# Patient Record
Sex: Male | Born: 1935 | Race: White | Hispanic: No | Smoking: Former smoker
Health system: Southern US, Community
[De-identification: ages and names within clinical notes are randomized; demographics above are authoritative.]

## PROBLEM LIST (undated history)

## (undated) DIAGNOSIS — R946 Abnormal results of thyroid function studies: Secondary | ICD-10-CM

## (undated) DIAGNOSIS — K3189 Other diseases of stomach and duodenum: Secondary | ICD-10-CM

## (undated) DIAGNOSIS — K219 Gastro-esophageal reflux disease without esophagitis: Secondary | ICD-10-CM

## (undated) DIAGNOSIS — M1990 Unspecified osteoarthritis, unspecified site: Secondary | ICD-10-CM

## (undated) DIAGNOSIS — E785 Hyperlipidemia, unspecified: Secondary | ICD-10-CM

## (undated) DIAGNOSIS — S065X9A Traumatic subdural hemorrhage with loss of consciousness of unspecified duration, initial encounter: Secondary | ICD-10-CM

## (undated) DIAGNOSIS — J45909 Unspecified asthma, uncomplicated: Secondary | ICD-10-CM

## (undated) DIAGNOSIS — I2510 Atherosclerotic heart disease of native coronary artery without angina pectoris: Secondary | ICD-10-CM

## (undated) DIAGNOSIS — K766 Portal hypertension: Secondary | ICD-10-CM

## (undated) DIAGNOSIS — J90 Pleural effusion, not elsewhere classified: Secondary | ICD-10-CM

## (undated) DIAGNOSIS — D126 Benign neoplasm of colon, unspecified: Secondary | ICD-10-CM

## (undated) DIAGNOSIS — D72821 Monocytosis (symptomatic): Secondary | ICD-10-CM

## (undated) DIAGNOSIS — J189 Pneumonia, unspecified organism: Secondary | ICD-10-CM

## (undated) DIAGNOSIS — K626 Ulcer of anus and rectum: Secondary | ICD-10-CM

## (undated) DIAGNOSIS — I509 Heart failure, unspecified: Secondary | ICD-10-CM

## (undated) DIAGNOSIS — I472 Ventricular tachycardia, unspecified: Secondary | ICD-10-CM

## (undated) DIAGNOSIS — I4891 Unspecified atrial fibrillation: Secondary | ICD-10-CM

## (undated) DIAGNOSIS — Z95810 Presence of automatic (implantable) cardiac defibrillator: Secondary | ICD-10-CM

## (undated) DIAGNOSIS — K529 Noninfective gastroenteritis and colitis, unspecified: Secondary | ICD-10-CM

## (undated) DIAGNOSIS — F05 Delirium due to known physiological condition: Secondary | ICD-10-CM

## (undated) DIAGNOSIS — K760 Fatty (change of) liver, not elsewhere classified: Secondary | ICD-10-CM

## (undated) DIAGNOSIS — I5022 Chronic systolic (congestive) heart failure: Secondary | ICD-10-CM

## (undated) DIAGNOSIS — I255 Ischemic cardiomyopathy: Secondary | ICD-10-CM

## (undated) DIAGNOSIS — N200 Calculus of kidney: Secondary | ICD-10-CM

## (undated) DIAGNOSIS — K5790 Diverticulosis of intestine, part unspecified, without perforation or abscess without bleeding: Secondary | ICD-10-CM

## (undated) DIAGNOSIS — I10 Essential (primary) hypertension: Secondary | ICD-10-CM

## (undated) DIAGNOSIS — I219 Acute myocardial infarction, unspecified: Secondary | ICD-10-CM

## (undated) DIAGNOSIS — D751 Secondary polycythemia: Secondary | ICD-10-CM

## (undated) HISTORY — DX: Hyperlipidemia, unspecified: E78.5

## (undated) HISTORY — DX: Calculus of kidney: N20.0

## (undated) HISTORY — DX: Gastro-esophageal reflux disease without esophagitis: K21.9

## (undated) HISTORY — DX: Pleural effusion, not elsewhere classified: J90

## (undated) HISTORY — DX: Heart failure, unspecified: I50.9

## (undated) HISTORY — DX: Diverticulosis of intestine, part unspecified, without perforation or abscess without bleeding: K57.90

## (undated) HISTORY — DX: Monocytosis (symptomatic): D72.821

## (undated) HISTORY — PX: TONSILLECTOMY: SUR1361

## (undated) HISTORY — DX: Chronic systolic (congestive) heart failure: I50.22

## (undated) HISTORY — DX: Fatty (change of) liver, not elsewhere classified: K76.0

## (undated) HISTORY — DX: Benign neoplasm of colon, unspecified: D12.6

## (undated) HISTORY — PX: KNEE ARTHROSCOPY: SHX127

## (undated) HISTORY — DX: Portal hypertension: K76.6

## (undated) HISTORY — DX: Ischemic cardiomyopathy: I25.5

## (undated) HISTORY — DX: Other diseases of stomach and duodenum: K31.89

## (undated) HISTORY — DX: Noninfective gastroenteritis and colitis, unspecified: K52.9

## (undated) HISTORY — DX: Traumatic subdural hemorrhage with loss of consciousness of unspecified duration, initial encounter: S06.5X9A

## (undated) HISTORY — PX: CHOLECYSTECTOMY: SHX55

## (undated) HISTORY — DX: Essential (primary) hypertension: I10

## (undated) HISTORY — DX: Unspecified atrial fibrillation: I48.91

## (undated) HISTORY — DX: Atherosclerotic heart disease of native coronary artery without angina pectoris: I25.10

## (undated) HISTORY — DX: Ventricular tachycardia, unspecified: I47.20

## (undated) HISTORY — DX: Secondary polycythemia: D75.1

## (undated) HISTORY — DX: Ulcer of anus and rectum: K62.6

## (undated) HISTORY — DX: Ventricular tachycardia: I47.2

## (undated) HISTORY — DX: Pneumonia, unspecified organism: J18.9

---

## 1981-01-31 HISTORY — PX: CORONARY ARTERY BYPASS GRAFT: SHX141

## 1997-09-11 ENCOUNTER — Ambulatory Visit (HOSPITAL_COMMUNITY): Admission: RE | Admit: 1997-09-11 | Discharge: 1997-09-11 | Payer: Self-pay

## 1998-02-11 ENCOUNTER — Emergency Department (HOSPITAL_COMMUNITY): Admission: EM | Admit: 1998-02-11 | Discharge: 1998-02-11 | Payer: Self-pay | Admitting: Emergency Medicine

## 1998-05-05 ENCOUNTER — Emergency Department (HOSPITAL_COMMUNITY): Admission: EM | Admit: 1998-05-05 | Discharge: 1998-05-05 | Payer: Self-pay | Admitting: Emergency Medicine

## 1998-05-06 ENCOUNTER — Encounter: Payer: Self-pay | Admitting: Emergency Medicine

## 1998-06-18 ENCOUNTER — Encounter: Payer: Self-pay | Admitting: Emergency Medicine

## 1998-06-18 ENCOUNTER — Emergency Department (HOSPITAL_COMMUNITY): Admission: EM | Admit: 1998-06-18 | Discharge: 1998-06-18 | Payer: Self-pay | Admitting: Emergency Medicine

## 1998-09-18 ENCOUNTER — Encounter (HOSPITAL_COMMUNITY): Payer: Self-pay | Admitting: Oncology

## 1998-09-18 ENCOUNTER — Ambulatory Visit (HOSPITAL_COMMUNITY): Admission: RE | Admit: 1998-09-18 | Discharge: 1998-09-18 | Payer: Self-pay | Admitting: Oncology

## 1998-11-02 ENCOUNTER — Ambulatory Visit (HOSPITAL_COMMUNITY): Admission: RE | Admit: 1998-11-02 | Discharge: 1998-11-02 | Payer: Self-pay | Admitting: *Deleted

## 2001-02-08 ENCOUNTER — Ambulatory Visit (HOSPITAL_COMMUNITY): Admission: RE | Admit: 2001-02-08 | Discharge: 2001-02-08 | Payer: Self-pay | Admitting: *Deleted

## 2001-04-04 ENCOUNTER — Inpatient Hospital Stay (HOSPITAL_COMMUNITY): Admission: EM | Admit: 2001-04-04 | Discharge: 2001-04-06 | Payer: Self-pay | Admitting: *Deleted

## 2001-10-03 ENCOUNTER — Encounter: Admission: RE | Admit: 2001-10-03 | Discharge: 2001-10-03 | Payer: Self-pay | Admitting: Internal Medicine

## 2001-10-03 ENCOUNTER — Encounter: Payer: Self-pay | Admitting: Internal Medicine

## 2002-01-31 HISTORY — PX: CORONARY ANGIOPLASTY WITH STENT PLACEMENT: SHX49

## 2002-05-29 ENCOUNTER — Encounter: Payer: Self-pay | Admitting: Emergency Medicine

## 2002-05-29 ENCOUNTER — Ambulatory Visit (HOSPITAL_COMMUNITY): Admission: EM | Admit: 2002-05-29 | Discharge: 2002-05-30 | Payer: Self-pay | Admitting: Emergency Medicine

## 2002-08-14 ENCOUNTER — Encounter (INDEPENDENT_AMBULATORY_CARE_PROVIDER_SITE_OTHER): Payer: Self-pay

## 2002-08-14 ENCOUNTER — Ambulatory Visit (HOSPITAL_COMMUNITY): Admission: RE | Admit: 2002-08-14 | Discharge: 2002-08-14 | Payer: Self-pay | Admitting: *Deleted

## 2002-09-30 ENCOUNTER — Encounter: Payer: Self-pay | Admitting: Internal Medicine

## 2002-09-30 ENCOUNTER — Encounter: Admission: RE | Admit: 2002-09-30 | Discharge: 2002-09-30 | Payer: Self-pay | Admitting: Internal Medicine

## 2002-10-28 ENCOUNTER — Encounter: Payer: Self-pay | Admitting: Emergency Medicine

## 2002-10-28 ENCOUNTER — Inpatient Hospital Stay (HOSPITAL_COMMUNITY): Admission: EM | Admit: 2002-10-28 | Discharge: 2002-11-01 | Payer: Self-pay | Admitting: Emergency Medicine

## 2002-12-18 ENCOUNTER — Emergency Department (HOSPITAL_COMMUNITY): Admission: EM | Admit: 2002-12-18 | Discharge: 2002-12-18 | Payer: Self-pay | Admitting: Emergency Medicine

## 2004-01-16 ENCOUNTER — Ambulatory Visit: Payer: Self-pay | Admitting: Oncology

## 2004-03-11 ENCOUNTER — Ambulatory Visit: Payer: Self-pay | Admitting: Oncology

## 2004-05-07 ENCOUNTER — Ambulatory Visit: Payer: Self-pay | Admitting: Oncology

## 2004-07-01 ENCOUNTER — Ambulatory Visit: Payer: Self-pay | Admitting: Oncology

## 2004-10-21 ENCOUNTER — Ambulatory Visit: Payer: Self-pay | Admitting: Oncology

## 2005-02-10 ENCOUNTER — Ambulatory Visit: Payer: Self-pay | Admitting: Oncology

## 2005-06-08 ENCOUNTER — Ambulatory Visit: Payer: Self-pay | Admitting: Oncology

## 2005-06-14 LAB — CBC WITH DIFFERENTIAL/PLATELET
EOS%: 1.7 % (ref 0.0–7.0)
Eosinophils Absolute: 0.1 10*3/uL (ref 0.0–0.5)
LYMPH%: 28 % (ref 14.0–48.0)
MCH: 30.4 pg (ref 28.0–33.4)
MCHC: 34.3 g/dL (ref 32.0–35.9)
MCV: 88.6 fL (ref 81.6–98.0)
MONO%: 9.8 % (ref 0.0–13.0)
Platelets: 299 10*3/uL (ref 145–400)
RBC: 5.23 10*6/uL (ref 4.20–5.71)
RDW: 13.9 % (ref 11.2–14.6)

## 2005-10-25 ENCOUNTER — Ambulatory Visit: Payer: Self-pay | Admitting: Oncology

## 2005-10-27 LAB — CBC WITH DIFFERENTIAL/PLATELET
BASO%: 0.5 % (ref 0.0–2.0)
EOS%: 2.5 % (ref 0.0–7.0)
HCT: 46.8 % (ref 38.7–49.9)
HGB: 16.2 g/dL (ref 13.0–17.1)
MCHC: 34.5 g/dL (ref 32.0–35.9)
MONO#: 0.7 10*3/uL (ref 0.1–0.9)
NEUT%: 61.5 % (ref 40.0–75.0)
RDW: 14 % (ref 11.2–14.6)
WBC: 7.1 10*3/uL (ref 4.0–10.0)
lymph#: 1.8 10*3/uL (ref 0.9–3.3)

## 2005-10-27 LAB — COMPREHENSIVE METABOLIC PANEL
ALT: 23 U/L (ref 0–40)
AST: 24 U/L (ref 0–37)
Albumin: 4.5 g/dL (ref 3.5–5.2)
CO2: 29 mEq/L (ref 19–32)
Calcium: 9.3 mg/dL (ref 8.4–10.5)
Chloride: 104 mEq/L (ref 96–112)
Creatinine, Ser: 1.32 mg/dL (ref 0.40–1.50)
Potassium: 3.9 mEq/L (ref 3.5–5.3)
Sodium: 142 mEq/L (ref 135–145)
Total Protein: 7.1 g/dL (ref 6.0–8.3)

## 2005-10-27 LAB — LACTATE DEHYDROGENASE: LDH: 120 U/L (ref 94–250)

## 2006-02-20 ENCOUNTER — Ambulatory Visit: Payer: Self-pay | Admitting: Oncology

## 2006-02-23 LAB — CBC WITH DIFFERENTIAL/PLATELET
BASO%: 0.5 % (ref 0.0–2.0)
Eosinophils Absolute: 0.2 10*3/uL (ref 0.0–0.5)
LYMPH%: 25.6 % (ref 14.0–48.0)
MCHC: 34.8 g/dL (ref 32.0–35.9)
MONO#: 0.8 10*3/uL (ref 0.1–0.9)
MONO%: 11.5 % (ref 0.0–13.0)
NEUT#: 4.2 10*3/uL (ref 1.5–6.5)
Platelets: 243 10*3/uL (ref 145–400)
RBC: 5.32 10*6/uL (ref 4.20–5.71)
RDW: 13.8 % (ref 11.2–14.6)
WBC: 7 10*3/uL (ref 4.0–10.0)

## 2006-06-20 ENCOUNTER — Ambulatory Visit: Payer: Self-pay | Admitting: Oncology

## 2006-06-30 LAB — CBC WITH DIFFERENTIAL/PLATELET
BASO%: 1 % (ref 0.0–2.0)
HCT: 46.9 % (ref 38.7–49.9)
LYMPH%: 31.4 % (ref 14.0–48.0)
MCH: 29.3 pg (ref 28.0–33.4)
MCHC: 34.1 g/dL (ref 32.0–35.9)
MCV: 86 fL (ref 81.6–98.0)
MONO%: 10 % (ref 0.0–13.0)
NEUT%: 55.1 % (ref 40.0–75.0)
Platelets: 267 10*3/uL (ref 145–400)
RBC: 5.45 10*6/uL (ref 4.20–5.71)

## 2006-09-27 ENCOUNTER — Encounter: Admission: RE | Admit: 2006-09-27 | Discharge: 2006-09-27 | Payer: Self-pay | Admitting: Internal Medicine

## 2006-10-16 ENCOUNTER — Inpatient Hospital Stay (HOSPITAL_COMMUNITY): Admission: EM | Admit: 2006-10-16 | Discharge: 2006-10-18 | Payer: Self-pay | Admitting: Emergency Medicine

## 2006-10-17 ENCOUNTER — Ambulatory Visit: Payer: Self-pay | Admitting: Oncology

## 2006-11-10 LAB — CBC WITH DIFFERENTIAL/PLATELET
BASO%: 0.6 % (ref 0.0–2.0)
EOS%: 3.1 % (ref 0.0–7.0)
HCT: 42.8 % (ref 38.7–49.9)
LYMPH%: 27.7 % (ref 14.0–48.0)
MCH: 30.7 pg (ref 28.0–33.4)
MCHC: 35.2 g/dL (ref 32.0–35.9)
MONO#: 0.7 10*3/uL (ref 0.1–0.9)
NEUT%: 57.1 % (ref 40.0–75.0)
Platelets: 218 10*3/uL (ref 145–400)
RBC: 4.91 10*6/uL (ref 4.20–5.71)
WBC: 6.4 10*3/uL (ref 4.0–10.0)
lymph#: 1.8 10*3/uL (ref 0.9–3.3)

## 2007-05-09 ENCOUNTER — Ambulatory Visit: Payer: Self-pay | Admitting: Oncology

## 2007-05-25 LAB — CBC WITH DIFFERENTIAL/PLATELET
BASO%: 1.3 % (ref 0.0–2.0)
EOS%: 2.7 % (ref 0.0–7.0)
Eosinophils Absolute: 0.3 10*3/uL (ref 0.0–0.5)
LYMPH%: 25 % (ref 14.0–48.0)
MCH: 30.9 pg (ref 28.0–33.4)
MCHC: 34.7 g/dL (ref 32.0–35.9)
MCV: 89 fL (ref 81.6–98.0)
MONO%: 10.3 % (ref 0.0–13.0)
NEUT#: 6.2 10*3/uL (ref 1.5–6.5)
RBC: 5.13 10*6/uL (ref 4.20–5.71)
RDW: 12.8 % (ref 11.2–14.6)

## 2007-06-10 ENCOUNTER — Emergency Department (HOSPITAL_COMMUNITY): Admission: EM | Admit: 2007-06-10 | Discharge: 2007-06-10 | Payer: Self-pay | Admitting: Emergency Medicine

## 2007-11-13 ENCOUNTER — Ambulatory Visit: Payer: Self-pay | Admitting: Oncology

## 2007-11-15 LAB — COMPREHENSIVE METABOLIC PANEL
AST: 20 U/L (ref 0–37)
Albumin: 4.7 g/dL (ref 3.5–5.2)
BUN: 15 mg/dL (ref 6–23)
Calcium: 10 mg/dL (ref 8.4–10.5)
Chloride: 103 mEq/L (ref 96–112)
Potassium: 4.5 mEq/L (ref 3.5–5.3)
Sodium: 144 mEq/L (ref 135–145)
Total Protein: 7.6 g/dL (ref 6.0–8.3)

## 2007-11-15 LAB — CBC WITH DIFFERENTIAL/PLATELET
Basophils Absolute: 0 10*3/uL (ref 0.0–0.1)
EOS%: 3.2 % (ref 0.0–7.0)
Eosinophils Absolute: 0.2 10*3/uL (ref 0.0–0.5)
HGB: 16.6 g/dL (ref 13.0–17.1)
MCH: 31.2 pg (ref 28.0–33.4)
NEUT#: 4.1 10*3/uL (ref 1.5–6.5)
RDW: 14.2 % (ref 11.2–14.6)
lymph#: 1.8 10*3/uL (ref 0.9–3.3)

## 2008-01-10 ENCOUNTER — Ambulatory Visit: Payer: Self-pay | Admitting: Oncology

## 2008-01-16 LAB — CBC WITH DIFFERENTIAL/PLATELET
BASO%: 0.7 % (ref 0.0–2.0)
EOS%: 11 % — ABNORMAL HIGH (ref 0.0–7.0)
MCH: 31.4 pg (ref 28.0–33.4)
MCHC: 34.2 g/dL (ref 32.0–35.9)
RDW: 14.5 % (ref 11.2–14.6)
lymph#: 1.9 10*3/uL (ref 0.9–3.3)

## 2008-02-11 ENCOUNTER — Inpatient Hospital Stay (HOSPITAL_COMMUNITY): Admission: AD | Admit: 2008-02-11 | Discharge: 2008-02-13 | Payer: Self-pay | Admitting: *Deleted

## 2008-02-12 ENCOUNTER — Encounter (INDEPENDENT_AMBULATORY_CARE_PROVIDER_SITE_OTHER): Payer: Self-pay | Admitting: *Deleted

## 2008-03-07 ENCOUNTER — Ambulatory Visit: Payer: Self-pay | Admitting: Oncology

## 2008-03-11 LAB — CBC WITH DIFFERENTIAL/PLATELET
Basophils Absolute: 0 10*3/uL (ref 0.0–0.1)
EOS%: 2.2 % (ref 0.0–7.0)
Eosinophils Absolute: 0.2 10*3/uL (ref 0.0–0.5)
HGB: 14.6 g/dL (ref 13.0–17.1)
LYMPH%: 23.7 % (ref 14.0–48.0)
MCH: 30.6 pg (ref 28.0–33.4)
MCV: 89.6 fL (ref 81.6–98.0)
MONO%: 13.5 % — ABNORMAL HIGH (ref 0.0–13.0)
Platelets: 319 10*3/uL (ref 145–400)
RBC: 4.78 10*6/uL (ref 4.20–5.71)
RDW: 13.7 % (ref 11.2–14.6)

## 2008-05-13 ENCOUNTER — Ambulatory Visit: Payer: Self-pay | Admitting: Oncology

## 2008-05-15 LAB — COMPREHENSIVE METABOLIC PANEL
ALT: 15 U/L (ref 0–53)
AST: 22 U/L (ref 0–37)
Albumin: 4.7 g/dL (ref 3.5–5.2)
Alkaline Phosphatase: 61 U/L (ref 39–117)
Calcium: 9.8 mg/dL (ref 8.4–10.5)
Chloride: 104 mEq/L (ref 96–112)
Potassium: 4.5 mEq/L (ref 3.5–5.3)

## 2008-05-15 LAB — CBC WITH DIFFERENTIAL/PLATELET
BASO%: 0.3 % (ref 0.0–2.0)
EOS%: 2 % (ref 0.0–7.0)
MCH: 27.8 pg (ref 27.2–33.4)
MCHC: 32.5 g/dL (ref 32.0–36.0)
MONO%: 13.7 % (ref 0.0–14.0)
RBC: 5.08 10*6/uL (ref 4.20–5.82)
RDW: 14.2 % (ref 11.0–14.6)
lymph#: 1.6 10*3/uL (ref 0.9–3.3)

## 2008-05-19 ENCOUNTER — Ambulatory Visit (HOSPITAL_COMMUNITY): Admission: RE | Admit: 2008-05-19 | Discharge: 2008-05-19 | Payer: Self-pay | Admitting: *Deleted

## 2008-07-08 ENCOUNTER — Ambulatory Visit: Payer: Self-pay | Admitting: Oncology

## 2008-07-10 LAB — CBC WITH DIFFERENTIAL/PLATELET
BASO%: 0.3 % (ref 0.0–2.0)
EOS%: 2.6 % (ref 0.0–7.0)
LYMPH%: 24.8 % (ref 14.0–49.0)
MCH: 27.8 pg (ref 27.2–33.4)
MCHC: 32.7 g/dL (ref 32.0–36.0)
MCV: 84.9 fL (ref 79.3–98.0)
MONO#: 0.9 10*3/uL (ref 0.1–0.9)
MONO%: 12.7 % (ref 0.0–14.0)
Platelets: 202 10*3/uL (ref 140–400)
RBC: 5.18 10*6/uL (ref 4.20–5.82)
WBC: 7.2 10*3/uL (ref 4.0–10.3)
nRBC: 0 % (ref 0–0)

## 2008-08-23 ENCOUNTER — Emergency Department (HOSPITAL_COMMUNITY): Admission: EM | Admit: 2008-08-23 | Discharge: 2008-08-23 | Payer: Self-pay | Admitting: Family Medicine

## 2008-09-03 ENCOUNTER — Ambulatory Visit: Payer: Self-pay | Admitting: Oncology

## 2008-09-05 LAB — CBC WITH DIFFERENTIAL/PLATELET
Eosinophils Absolute: 0.2 10*3/uL (ref 0.0–0.5)
LYMPH%: 24.5 % (ref 14.0–49.0)
MCHC: 33.6 g/dL (ref 32.0–36.0)
MCV: 85.8 fL (ref 79.3–98.0)
MONO%: 19.6 % — ABNORMAL HIGH (ref 0.0–14.0)
NEUT#: 4 10*3/uL (ref 1.5–6.5)
Platelets: 178 10*3/uL (ref 140–400)
RBC: 5.13 10*6/uL (ref 4.20–5.82)

## 2008-11-04 ENCOUNTER — Ambulatory Visit: Payer: Self-pay | Admitting: Oncology

## 2008-11-06 LAB — COMPREHENSIVE METABOLIC PANEL
ALT: 11 U/L (ref 0–53)
AST: 17 U/L (ref 0–37)
Albumin: 4.5 g/dL (ref 3.5–5.2)
Alkaline Phosphatase: 70 U/L (ref 39–117)
BUN: 18 mg/dL (ref 6–23)
Calcium: 9.1 mg/dL (ref 8.4–10.5)
Chloride: 105 mEq/L (ref 96–112)
Potassium: 4.4 mEq/L (ref 3.5–5.3)
Sodium: 142 mEq/L (ref 135–145)

## 2008-11-06 LAB — CBC WITH DIFFERENTIAL/PLATELET
BASO%: 0.6 % (ref 0.0–2.0)
EOS%: 2 % (ref 0.0–7.0)
HGB: 14.6 g/dL (ref 13.0–17.1)
MCH: 29.9 pg (ref 27.2–33.4)
MCHC: 34.1 g/dL (ref 32.0–36.0)
MCV: 87.7 fL (ref 79.3–98.0)
MONO%: 15.9 % — ABNORMAL HIGH (ref 0.0–14.0)
RBC: 4.87 10*6/uL (ref 4.20–5.82)
RDW: 16 % — ABNORMAL HIGH (ref 11.0–14.6)
lymph#: 1.7 10*3/uL (ref 0.9–3.3)

## 2009-03-05 ENCOUNTER — Ambulatory Visit (HOSPITAL_COMMUNITY): Admission: RE | Admit: 2009-03-05 | Discharge: 2009-03-05 | Payer: Self-pay | Admitting: Cardiology

## 2009-05-05 ENCOUNTER — Ambulatory Visit: Payer: Self-pay | Admitting: Oncology

## 2009-05-07 LAB — COMPREHENSIVE METABOLIC PANEL
AST: 17 U/L (ref 0–37)
Albumin: 4.8 g/dL (ref 3.5–5.2)
Alkaline Phosphatase: 67 U/L (ref 39–117)
BUN: 18 mg/dL (ref 6–23)
Creatinine, Ser: 1.18 mg/dL (ref 0.40–1.50)
Glucose, Bld: 107 mg/dL — ABNORMAL HIGH (ref 70–99)
Potassium: 4.4 mEq/L (ref 3.5–5.3)
Total Bilirubin: 0.7 mg/dL (ref 0.3–1.2)

## 2009-05-07 LAB — CBC WITH DIFFERENTIAL/PLATELET
Basophils Absolute: 0 10*3/uL (ref 0.0–0.1)
EOS%: 1.5 % (ref 0.0–7.0)
Eosinophils Absolute: 0.1 10*3/uL (ref 0.0–0.5)
HCT: 44.8 % (ref 38.4–49.9)
HGB: 14.9 g/dL (ref 13.0–17.1)
LYMPH%: 24.4 % (ref 14.0–49.0)
MCH: 28.9 pg (ref 27.2–33.4)
MCV: 87 fL (ref 79.3–98.0)
MONO%: 16.4 % — ABNORMAL HIGH (ref 0.0–14.0)
NEUT#: 4.4 10*3/uL (ref 1.5–6.5)
NEUT%: 57.1 % (ref 39.0–75.0)
Platelets: 229 10*3/uL (ref 140–400)
RDW: 16.2 % — ABNORMAL HIGH (ref 11.0–14.6)

## 2009-05-12 ENCOUNTER — Inpatient Hospital Stay (HOSPITAL_COMMUNITY): Admission: EM | Admit: 2009-05-12 | Discharge: 2009-05-15 | Payer: Self-pay | Admitting: Emergency Medicine

## 2009-05-12 ENCOUNTER — Ambulatory Visit: Payer: Self-pay | Admitting: Cardiology

## 2009-05-14 ENCOUNTER — Encounter: Payer: Self-pay | Admitting: Cardiology

## 2009-06-03 ENCOUNTER — Encounter: Admission: RE | Admit: 2009-06-03 | Discharge: 2009-06-03 | Payer: Self-pay | Admitting: Internal Medicine

## 2009-09-04 ENCOUNTER — Ambulatory Visit: Payer: Self-pay | Admitting: Cardiology

## 2009-10-06 ENCOUNTER — Ambulatory Visit: Payer: Self-pay | Admitting: Cardiology

## 2009-10-17 ENCOUNTER — Ambulatory Visit: Payer: Self-pay | Admitting: Cardiology

## 2009-10-17 ENCOUNTER — Encounter: Payer: Self-pay | Admitting: Internal Medicine

## 2009-11-03 ENCOUNTER — Ambulatory Visit: Payer: Self-pay | Admitting: Cardiology

## 2009-11-04 ENCOUNTER — Ambulatory Visit: Payer: Self-pay | Admitting: Oncology

## 2009-11-06 LAB — CBC WITH DIFFERENTIAL/PLATELET
BASO%: 0.2 % (ref 0.0–2.0)
EOS%: 1.5 % (ref 0.0–7.0)
LYMPH%: 17.1 % (ref 14.0–49.0)
MCH: 27.3 pg (ref 27.2–33.4)
MCHC: 33 g/dL (ref 32.0–36.0)
MONO#: 1.7 10*3/uL — ABNORMAL HIGH (ref 0.1–0.9)
NEUT%: 61.9 % (ref 39.0–75.0)
RBC: 4.66 10*6/uL (ref 4.20–5.82)
WBC: 8.9 10*3/uL (ref 4.0–10.3)
lymph#: 1.5 10*3/uL (ref 0.9–3.3)

## 2009-11-06 LAB — LACTATE DEHYDROGENASE: LDH: 156 U/L (ref 94–250)

## 2009-11-06 LAB — COMPREHENSIVE METABOLIC PANEL
ALT: 13 U/L (ref 0–53)
AST: 19 U/L (ref 0–37)
CO2: 27 mEq/L (ref 19–32)
Chloride: 104 mEq/L (ref 96–112)
Creatinine, Ser: 1.35 mg/dL (ref 0.40–1.50)
Sodium: 141 mEq/L (ref 135–145)
Total Bilirubin: 0.7 mg/dL (ref 0.3–1.2)
Total Protein: 6.9 g/dL (ref 6.0–8.3)

## 2009-11-16 ENCOUNTER — Ambulatory Visit: Payer: Self-pay | Admitting: Cardiology

## 2009-11-21 ENCOUNTER — Encounter: Payer: Self-pay | Admitting: Internal Medicine

## 2009-11-30 ENCOUNTER — Ambulatory Visit: Payer: Self-pay

## 2009-12-03 ENCOUNTER — Telehealth: Payer: Self-pay | Admitting: Internal Medicine

## 2009-12-06 ENCOUNTER — Emergency Department (HOSPITAL_COMMUNITY): Admission: EM | Admit: 2009-12-06 | Discharge: 2009-12-06 | Payer: Self-pay | Admitting: Emergency Medicine

## 2009-12-13 ENCOUNTER — Inpatient Hospital Stay (HOSPITAL_COMMUNITY): Admission: EM | Admit: 2009-12-13 | Discharge: 2009-12-15 | Payer: Self-pay | Admitting: Emergency Medicine

## 2009-12-13 ENCOUNTER — Ambulatory Visit: Payer: Self-pay | Admitting: Interventional Cardiology

## 2009-12-16 ENCOUNTER — Ambulatory Visit: Payer: Self-pay | Admitting: Cardiology

## 2009-12-28 ENCOUNTER — Ambulatory Visit: Payer: Self-pay | Admitting: Internal Medicine

## 2009-12-29 ENCOUNTER — Ambulatory Visit: Payer: Self-pay | Admitting: Cardiology

## 2009-12-30 DIAGNOSIS — I5022 Chronic systolic (congestive) heart failure: Secondary | ICD-10-CM

## 2009-12-30 DIAGNOSIS — I472 Ventricular tachycardia: Secondary | ICD-10-CM

## 2009-12-30 DIAGNOSIS — I482 Chronic atrial fibrillation, unspecified: Secondary | ICD-10-CM

## 2009-12-30 DIAGNOSIS — Z95810 Presence of automatic (implantable) cardiac defibrillator: Secondary | ICD-10-CM

## 2009-12-30 HISTORY — DX: Chronic systolic (congestive) heart failure: I50.22

## 2010-01-20 ENCOUNTER — Ambulatory Visit: Payer: Self-pay

## 2010-02-04 ENCOUNTER — Ambulatory Visit: Payer: Self-pay | Admitting: Cardiovascular Disease

## 2010-02-19 ENCOUNTER — Ambulatory Visit: Payer: Self-pay | Admitting: Cardiology

## 2010-03-04 NOTE — Assessment & Plan Note (Signed)
Summary: 3-4 wks fu from icd shock/mt   Visit Type:  3-4 week follow up  CC:  shortness of breath, headaches, and dizziness.  History of Present Illness: [name] returns today for followup.  He is a pleasant 77 yo man with a h/o VT, ICM, CHF and is s/p ICD implant.  He was recently hospitalized with worsening CHF.  He denies c/p.  He had an episode of VT several weeks ago which was successfully terminated with an ICD shock after ATP failed to terminate his VT.  Current Medications (verified): 1)  Crestor 5 Mg Tabs (Rosuvastatin Calcium) .Marland Kitchen.. 1 Tablet Weekly 2)  Carvedilol 25 Mg Tabs (Carvedilol) .... One By Mouth in The Am and Half in The Evening 3)  Lisinopril 10 Mg Tabs (Lisinopril) .... One By Mouth Daily 4)  Furosemide 40 Mg Tabs (Furosemide) .Marland Kitchen.. 1 1/2 By Mouth Daily 5)  Klor-Con 10 10 Meq Cr-Tabs (Potassium Chloride) .... One By Mouth Daily 6)  Pacerone 200 Mg Tabs (Amiodarone Hcl) .... Two Times A Day 7)  Coumadin 5 Mg Tabs (Warfarin Sodium) .... As Directed 8)  Aspirin 81 Mg Tbec (Aspirin) .... One By Mouth Daily 9)  Proair Hfa 108 (90 Base) Mcg/act Aers (Albuterol Sulfate) .... As Needed 10)  Loratadine 10 Mg Tabs (Loratadine) .... One By Mouth Daily 11)  Pantoprazole Sodium 40 Mg Tbec (Pantoprazole Sodium) .... Two Times A Day 12)  Refresh Eye Itch Relief 0.025 % Soln (Ketotifen Fumarate) .... As Needed  Allergies (verified): 1)  ! * Multaq 2)  ! * Digoxin  Past History:  Past Medical History: Last updated: 12/25/2009 Coronary artery disease Ischemic cardiomyopathy Atrial fibrillation  Past Surgical History: Last updated: 12/25/2009 Bypass surgery cholecystectomy ICD implant  Review of Systems  The patient denies chest pain, syncope, dyspnea on exertion, and peripheral edema.    Vital Signs:  Patient profile:   77 year old male Height:      72 inches Weight:      208.75 pounds BMI:     28.41 Pulse rate:   55 / minute BP sitting:   128 / 66  (left  arm) Cuff size:   regular  Vitals Entered By: [name] CMA (December 28, 2009 9:32 AM)  Physical Exam  General:  Well developed, well nourished, in no acute distress. Head:  normocephalic and atraumatic Eyes:  PERRLA/EOM intact; conjunctiva and lids normal. Mouth:  Teeth, gums and palate normal. Oral mucosa normal. Neck:  Neck supple, no JVD. No masses, thyromegaly or abnormal cervical nodes. Chest Wall:  no deformities or breast masses noted Lungs:  Clear bilaterally to auscultation except for rales in the bases. Heart:  RRR with normal S1 and S2.  PMI is enlarged and laterally displaced. Abdomen:  Bowel sounds positive; abdomen soft and non-tender without masses, organomegaly, or hernias noted. No hepatosplenomegaly. Msk:  Back normal, normal gait. Muscle strength and tone normal. Pulses:  pulses normal in all 4 extremities Extremities:  No clubbing or cyanosis. Neurologic:  Alert and oriented x 3.   EKG  Procedure date:  12/28/2009  Findings:      Sinus bradycardia with rate of:55.  First degree AV-Block noted.  Non-specific ST-T wave changes noted.     ICD Specifications Following MD:  Lewayne Bunting, MD     Referring MD:  Vonna Drafts ICD Vendor:  Medtronic     ICD Model Number:  7230     ICD Serial Number:  [id number] ICD DOI:  10/31/2002  ICD Implanting MD:  Lewayne Bunting, MD  Lead 1:    Location: RA     DOI: 10/31/2002     Model #: 1114     Serial #: [id number]     Status: active Lead 2:    Location: RV     DOI: 10/31/2002     Model #: 3837     Status: active  Indications::  VT   ICD Follow Up Remote Check?  No ICD Dependent:  No       ICD Device Measurements Right Ventricle:  Amplitude: 4.4 mV, Impedance: 432 ohms,  Shock Impedance: 64/65 ohms   Episodes Coumadin:  Yes  Brady Parameters Mode VVI     Lower Rate Limit:  40      Tachy Zones VF:  200-500     VT:  200-250 (VIA VF)     VT1:  171-200     Next Remote Date:  04/01/2010     Next Cardiology Appt  Due:  12/02/2010 Tech Comments:  No parameter changes.  Device function normal.  No further episodes since 12/01/10.  Carelink transmissions every 3 months.  ROV 1 year with Dr. [name].  Altha Harm, LPN  December 28, 2009 10:12 AM  MD Comments:  Agree with above.  Impression & Recommendations:  Problem # 1:  VENTRICULAR TACHYCARDIA (ICD-427.1) He will continue on with his amiodarone.  I will consider reducing his dose when I see him back in several months. The following medications were removed from the medication list:    Diltiazem Hcl 120 Mg Tabs (Diltiazem hcl) .Marland Kitchen... 2 in the am and 1 in the pm His updated medication list for this problem includes:    Carvedilol 25 Mg Tabs (Carvedilol) ..... One by mouth in the am and half in the evening    Lisinopril 10 Mg Tabs (Lisinopril) ..... One by mouth daily    Pacerone 200 Mg Tabs (Amiodarone hcl) .Marland Kitchen... Take one and 1/2 pill bid    Coumadin 5 Mg Tabs (Warfarin sodium) .Marland Kitchen... As directed    Aspirin 81 Mg Tbec (Aspirin) ..... One by mouth daily  Problem # 2:  AUTOMATIC IMPLANTABLE CARDIAC DEFIBRILLATOR SITU (ICD-V45.02) His device is working normally.  Will recheck in several months.  Problem # 3:  CHRONIC SYSTOLIC HEART FAILURE (ICD-428.22) His symptoms are class 2.  Continue meds as below. The following medications were removed from the medication list:    Diltiazem Hcl 120 Mg Tabs (Diltiazem hcl) .Marland Kitchen... 2 in the am and 1 in the pm His updated medication list for this problem includes:    Carvedilol 25 Mg Tabs (Carvedilol) ..... One by mouth in the am and half in the evening    Lisinopril 10 Mg Tabs (Lisinopril) ..... One by mouth daily    Furosemide 40 Mg Tabs (Furosemide) .Marland Kitchen... 1 1/2 by mouth daily    Pacerone 200 Mg Tabs (Amiodarone hcl) .Marland Kitchen... Take one and 1/2 pill bid    Coumadin 5 Mg Tabs (Warfarin sodium) .Marland Kitchen... As directed    Aspirin 81 Mg Tbec (Aspirin) ..... One by mouth daily  Patient Instructions: 1)  Your physician wants you  to follow-up in: 1 year  You will receive a reminder letter in the mail two months in advance. If you don't receive a letter, please call our office to schedule the follow-up appointment. 2)  Your physician has recommended you make the following change in your medication: Decrease Pacerone to 150mg  (one and one half pill)  twice  a day.

## 2010-03-04 NOTE — Miscellaneous (Signed)
Summary: Device preload  Clinical Lists Changes  Observations: Added new observation of ICD INDICATN: VT (11/21/2009 15:34) Added new observation of ICDLEADSTAT2: active (11/21/2009 15:34) Added new observation of ICDLEADMOD2: 3837  (11/21/2009 15:34) Added new observation of ICDLEADDOI2: 10/31/2002  (11/21/2009 15:34) Added new observation of ICDLEADLOC2: RV  (11/21/2009 15:34) Added new observation of ICDLEADSTAT1: active  (11/21/2009 15:34) Added new observation of ICDLEADSER1: 3835  (11/21/2009 15:34) Added new observation of ICDLEADMOD1: 1114  (11/21/2009 15:34) Added new observation of ICDLEADDOI1: 10/31/2002  (11/21/2009 15:34) Added new observation of ICDLEADLOC1: RA  (11/21/2009 15:34) Added new observation of ICD IMP MD: Lewayne Bunting, MD  (11/21/2009 15:34) Added new observation of ICD IMPL DTE: 10/31/2002  (11/21/2009 15:34) Added new observation of ICD SERL#: [id number]  (11/21/2009 15:34) Added new observation of ICD MODL#: 7230  (11/21/2009 15:34) Added new observation of ICDMANUFACTR: Medtronic  (11/21/2009 15:34) Added new observation of IDC REFER MD: Vonna Drafts  (11/21/2009 15:34) Added new observation of ICD MD: Lewayne Bunting, MD  (11/21/2009 15:34)       ICD Specifications Following MD:  Lewayne Bunting, MD     Referring MD:  Vonna Drafts ICD Vendor:  [employer]onic     ICD Model Number:  7230     ICD Serial Number:  [id number] ICD DOI:  10/31/2002     ICD Implanting MD:  Lewayne Bunting, MD  Lead 1:    Location: RA     DOI: 10/31/2002     Model #: 1114     Serial #: [id number]     Status: active Lead 2:    Location: RV     DOI: 10/31/2002     Model #: 3837     Status: active  Indications::  VT

## 2010-03-04 NOTE — Procedures (Signed)
Summary: icd check/medtronic   Current Medications (verified): 1)  Crestor 5 Mg Tabs (Rosuvastatin Calcium) .... One By Mouth Daily 2)  Carvedilol 25 Mg Tabs (Carvedilol) .... One By Mouth in The Am and Half in The Evening 3)  Lisinopril 10 Mg Tabs (Lisinopril) .... One By Mouth Daily 4)  Furosemide 40 Mg Tabs (Furosemide) .... One By Mouth Daily 5)  Klor-Con 10 10 Meq Cr-Tabs (Potassium Chloride) .... One By Mouth Daily 6)  Diltiazem Hcl 120 Mg Tabs (Diltiazem Hcl) .... 2 in The Am and 1 in The Pm 7)  Pacerone 200 Mg Tabs (Amiodarone Hcl) .... One Half By Mouth Daily 8)  Coumadin 5 Mg Tabs (Warfarin Sodium) .... As Directed 9)  Nexium 40 Mg Cpdr (Esomeprazole Magnesium) .... One By Mouth Daily 10)  Aspirin 81 Mg Tbec (Aspirin) .... One By Mouth Daily 11)  Proair Hfa 108 (90 Base) Mcg/act Aers (Albuterol Sulfate) .... As Needed 12)  Loratadine 10 Mg Tabs (Loratadine) .... One By Mouth Daily 13)  Lovaza 1 Gm Caps (Omega-3-Acid Ethyl Esters) .... 2 By Mouth Daily  Allergies (verified): No Known Drug Allergies   ICD Specifications Following MD:  Joshua Bunting, MD     Referring MD:  [name] ICD Vendor:  Medtronic     ICD Model Number:  7230     ICD Serial Number:  [id number] ICD DOI:  10/31/2002     ICD Implanting MD:  Joshua Bunting, MD  Lead 1:    Location: RA     DOI: 10/31/2002     Model #: 1114     Serial #: [id number]     Status: active Lead 2:    Location: RV     DOI: 10/31/2002     Model #: 3837     Status: active  Indications::  VT   ICD Follow Up Battery Voltage:  2.76 V     Charge Time:  8.13 seconds     Underlying rhythm:  SR   ICD Device Measurements Right Ventricle:  Amplitude: 4.3 mV, Impedance: 432 ohms, Threshold: 1.5 V at 0.6 msec Shock Impedance: 65/64 ohms   Episodes MS Episodes:  0     Percent Mode Switch:  0     Shock:  1     ATP:  5     Nonsustained:  2     Atrial Therapies:  0 Ventricular Pacing:  0.2%  Brady Parameters Mode VVI     Lower Rate Limit:   40      Tachy Zones VF:  200-500     VT:  200-250 (VIA VF)     VT1:  171-200     Tech Comments:  4 VT EPISODES--4 ATP SUCCESSFUL AND 1 29.8 JOULE SHOCK SUCCESSFUL.  PT SYMPTOMATIC W/EPISODES.  PER GT INCREASE PACERONE TO 200mg  two times day AND FOLLOWUP 3-4 WEEKS WITH DR [name] [name]  December 01, 2009 2:54 PM

## 2010-03-04 NOTE — Progress Notes (Signed)
Summary: refill meds/ clarify dosage   Phone Note Refill Request Call back at Home Phone [phone] Message from:  Patient on December 03, 2009 1:03 PM  Refills Requested: Medication #1:  PACERONE 200 MG TABS one half by mouth daily   Supply Requested: 3 months pls clarify dosage/ direction pt states meds was increase.  [facility] [address].    Method Requested: Fax to Local Pharmacy Initial call taken by: Lorne Skeens,  December 03, 2009 1:04 PM    Prescriptions: PACERONE 200 MG TABS (AMIODARONE HCL) one half by mouth daily  #30 x 5   Entered by:   Laurance Flatten CMA   Authorized by:   Laren Boom, MD, [facility]   Signed by:   Laurance Flatten CMA on 12/03/2009   Method used:   Electronically to        [facility] [address] (retail)       [phone]       [place], [place]  [address]       Ph: [phone]       Fax: [phone]   RxID:   [id number]

## 2010-03-26 ENCOUNTER — Ambulatory Visit (INDEPENDENT_AMBULATORY_CARE_PROVIDER_SITE_OTHER): Payer: Medicare Other | Admitting: Cardiology

## 2010-03-26 DIAGNOSIS — I509 Heart failure, unspecified: Secondary | ICD-10-CM

## 2010-03-26 DIAGNOSIS — I4891 Unspecified atrial fibrillation: Secondary | ICD-10-CM

## 2010-03-26 DIAGNOSIS — Z7901 Long term (current) use of anticoagulants: Secondary | ICD-10-CM

## 2010-03-26 DIAGNOSIS — I2510 Atherosclerotic heart disease of native coronary artery without angina pectoris: Secondary | ICD-10-CM

## 2010-04-01 ENCOUNTER — Encounter (INDEPENDENT_AMBULATORY_CARE_PROVIDER_SITE_OTHER): Payer: Medicare Other

## 2010-04-01 DIAGNOSIS — I428 Other cardiomyopathies: Secondary | ICD-10-CM

## 2010-04-13 LAB — POCT CARDIAC MARKERS
CKMB, poc: <1 ng/mL — ABNORMAL LOW (ref 1.0–8.0)
Myoglobin, poc: 81.3 ng/mL (ref 12–200)
Troponin i, poc: <0.05 ng/mL (ref 0.00–0.09)

## 2010-04-13 LAB — BASIC METABOLIC PANEL
BUN: 16 mg/dL (ref 6–23)
CO2: 25 mEq/L (ref 19–32)
Calcium: 9.2 mg/dL (ref 8.4–10.5)
Chloride: 103 mEq/L (ref 96–112)
Chloride: 105 mEq/L (ref 96–112)
Creatinine, Ser: 1.32 mg/dL (ref 0.4–1.5)
Creatinine, Ser: 1.37 mg/dL (ref 0.4–1.5)
GFR calc Af Amer: >60 mL/min (ref >60)
GFR calc Af Amer: >60 mL/min (ref >60)
GFR calc non Af Amer: 54 mL/min — ABNORMAL LOW (ref >60)
Glucose, Bld: 114 mg/dL — ABNORMAL HIGH (ref 70–99)
Glucose, Bld: 184 mg/dL — ABNORMAL HIGH (ref 70–99)
Sodium: 136 mEq/L (ref 135–145)
Sodium: 141 mEq/L (ref 135–145)

## 2010-04-13 LAB — CARDIAC PANEL(CRET KIN+CKTOT+MB+TROPI)
CK, MB: 1.8 ng/mL (ref 0.3–4.0)
Relative Index: 2 (ref 0.0–2.5)
Relative Index: INVALID (ref 0.0–2.5)
Total CK: 93 U/L (ref 7–232)
Troponin I: 0.01 ng/mL (ref 0.00–0.06)
Troponin I: 0.02 ng/mL (ref 0.00–0.06)
Troponin I: 0.03 ng/mL (ref 0.00–0.06)

## 2010-04-13 LAB — URINE CULTURE: Colony Count: NO GROWTH

## 2010-04-13 LAB — URINALYSIS, ROUTINE W REFLEX MICROSCOPIC
Glucose, UA: NEGATIVE mg/dL
Hgb urine dipstick: NEGATIVE
Protein, ur: NEGATIVE mg/dL
Specific Gravity, Urine: 1.022 (ref 1.005–1.030)

## 2010-04-13 LAB — DIFFERENTIAL
Eosinophils Absolute: 0.1 10*3/uL (ref 0.0–0.7)
Eosinophils Relative: 2 % (ref 0–5)
Lymphs Abs: 1.3 10*3/uL (ref 0.7–4.0)
Monocytes Absolute: 1.3 10*3/uL — ABNORMAL HIGH (ref 0.1–1.0)
Monocytes Relative: 16 % — ABNORMAL HIGH (ref 3–12)

## 2010-04-13 LAB — CBC
Hemoglobin: 11.1 g/dL — ABNORMAL LOW (ref 13.0–17.0)
MCH: 25.7 pg — ABNORMAL LOW (ref 26.0–34.0)
MCHC: 30.7 g/dL (ref 30.0–36.0)
MCV: 83.6 fL (ref 78.0–100.0)
RBC: 4.32 MIL/uL (ref 4.22–5.81)

## 2010-04-13 LAB — BRAIN NATRIURETIC PEPTIDE: Pro B Natriuretic peptide (BNP): 105 pg/mL — ABNORMAL HIGH (ref 0.0–100.0)

## 2010-04-19 ENCOUNTER — Encounter: Payer: Self-pay | Admitting: *Deleted

## 2010-04-20 LAB — BASIC METABOLIC PANEL
BUN: 17 mg/dL (ref 6–23)
CO2: 32 mEq/L (ref 19–32)
Chloride: 100 mEq/L (ref 96–112)
Creatinine, Ser: 1.42 mg/dL (ref 0.4–1.5)

## 2010-04-20 LAB — PROTIME-INR: Prothrombin Time: 24 seconds — ABNORMAL HIGH (ref 11.6–15.2)

## 2010-04-21 LAB — URINALYSIS, ROUTINE W REFLEX MICROSCOPIC
Bilirubin Urine: NEGATIVE
Glucose, UA: NEGATIVE mg/dL
Hgb urine dipstick: NEGATIVE
Ketones, ur: NEGATIVE mg/dL
Nitrite: NEGATIVE
Specific Gravity, Urine: 1.011 (ref 1.005–1.030)
pH: 7.5 (ref 5.0–8.0)

## 2010-04-21 LAB — DIFFERENTIAL
Basophils Absolute: 0 10*3/uL (ref 0.0–0.1)
Basophils Relative: 0 % (ref 0–1)
Eosinophils Absolute: 0.1 10*3/uL (ref 0.0–0.7)
Eosinophils Relative: 2 % (ref 0–5)
Lymphocytes Relative: 16 % (ref 12–46)
Lymphocytes Relative: 18 % (ref 12–46)
Lymphs Abs: 1.5 10*3/uL (ref 0.7–4.0)
Monocytes Absolute: 1.4 10*3/uL — ABNORMAL HIGH (ref 0.1–1.0)
Monocytes Relative: 16 % — ABNORMAL HIGH (ref 3–12)
Neutrophils Relative %: 65 % (ref 43–77)

## 2010-04-21 LAB — CARDIAC PANEL(CRET KIN+CKTOT+MB+TROPI)
CK, MB: 2.2 ng/mL (ref 0.3–4.0)
Relative Index: 2.4 (ref 0.0–2.5)
Relative Index: INVALID (ref 0.0–2.5)
Total CK: 97 U/L (ref 7–232)
Troponin I: <0.01 ng/mL (ref 0.00–0.06)

## 2010-04-21 LAB — COMPREHENSIVE METABOLIC PANEL
ALT: 12 U/L (ref 0–53)
ALT: 13 U/L (ref 0–53)
AST: 16 U/L (ref 0–37)
AST: 25 U/L (ref 0–37)
Albumin: 3.8 g/dL (ref 3.5–5.2)
Alkaline Phosphatase: 64 U/L (ref 39–117)
CO2: 29 mEq/L (ref 19–32)
CO2: 29 mEq/L (ref 19–32)
Calcium: 8.9 mg/dL (ref 8.4–10.5)
Chloride: 104 mEq/L (ref 96–112)
Creatinine, Ser: 1.24 mg/dL (ref 0.4–1.5)
Creatinine, Ser: 1.32 mg/dL (ref 0.4–1.5)
GFR calc Af Amer: >60 mL/min (ref >60)
GFR calc Af Amer: >60 mL/min (ref >60)
GFR calc non Af Amer: 53 mL/min — ABNORMAL LOW (ref >60)
GFR calc non Af Amer: 57 mL/min — ABNORMAL LOW (ref >60)
Glucose, Bld: 154 mg/dL — ABNORMAL HIGH (ref 70–99)
Potassium: 4.3 mEq/L (ref 3.5–5.1)
Sodium: 136 mEq/L (ref 135–145)
Total Bilirubin: 0.6 mg/dL (ref 0.3–1.2)
Total Protein: 6.9 g/dL (ref 6.0–8.3)

## 2010-04-21 LAB — TSH: TSH: 1.177 u[IU]/mL (ref 0.350–4.500)

## 2010-04-21 LAB — URINE CULTURE: Colony Count: 85000

## 2010-04-21 LAB — BASIC METABOLIC PANEL
CO2: 32 mEq/L (ref 19–32)
Chloride: 101 mEq/L (ref 96–112)
Creatinine, Ser: 1.26 mg/dL (ref 0.4–1.5)
GFR calc Af Amer: >60 mL/min (ref >60)
Potassium: 4.1 mEq/L (ref 3.5–5.1)
Sodium: 141 mEq/L (ref 135–145)

## 2010-04-21 LAB — CBC
HCT: 39.7 % (ref 39.0–52.0)
Hemoglobin: 13.5 g/dL (ref 13.0–17.0)
MCHC: 34.3 g/dL (ref 30.0–36.0)
MCV: 85.7 fL (ref 78.0–100.0)
MCV: 85.9 fL (ref 78.0–100.0)
Platelets: 191 10*3/uL (ref 150–400)
RBC: 4.65 MIL/uL (ref 4.22–5.81)
RDW: 15.8 % — ABNORMAL HIGH (ref 11.5–15.5)
WBC: 8.6 10*3/uL (ref 4.0–10.5)

## 2010-04-21 LAB — TROPONIN I: Troponin I: <0.01 ng/mL (ref 0.00–0.06)

## 2010-04-21 LAB — AMIODARONE LEVEL: N-Desethyl-Amiodarone: <0.3 ug/mL — ABNORMAL LOW (ref 1.5–2.5)

## 2010-04-21 LAB — BRAIN NATRIURETIC PEPTIDE
Pro B Natriuretic peptide (BNP): 176 pg/mL — ABNORMAL HIGH (ref 0.0–100.0)
Pro B Natriuretic peptide (BNP): 182 pg/mL — ABNORMAL HIGH (ref 0.0–100.0)

## 2010-04-21 LAB — PROTIME-INR
INR: 2.17 — ABNORMAL HIGH (ref 0.00–1.49)
Prothrombin Time: 24 seconds — ABNORMAL HIGH (ref 11.6–15.2)

## 2010-04-22 ENCOUNTER — Ambulatory Visit (INDEPENDENT_AMBULATORY_CARE_PROVIDER_SITE_OTHER): Payer: Medicare Other | Admitting: *Deleted

## 2010-04-22 DIAGNOSIS — Z7901 Long term (current) use of anticoagulants: Secondary | ICD-10-CM

## 2010-04-22 DIAGNOSIS — I4891 Unspecified atrial fibrillation: Secondary | ICD-10-CM

## 2010-04-22 LAB — POCT INR: INR: 2.2

## 2010-04-22 NOTE — Progress Notes (Signed)
Notified of coumadin results. Continue same dose. Recheck INR in 4 wks

## 2010-04-29 NOTE — Letter (Signed)
Summary: Remote Device Check  [facility], Main Office  [address]   [place], [place] [address]   Phone: [phone]  Fax: [phone]     April 19, 2010 MRN: [id number]   [name] [address] [place], [place]  [address]   Dear Mr. IVEY,   Your remote transmission was recieved and reviewed by your physician.  All diagnostics were within normal limits for you.  __X___Your next transmission is scheduled for:  07-01-2010.  Please transmit at any time this day.  If you have a wireless device your transmission will be sent automatically.    Sincerely,  [name]

## 2010-04-29 NOTE — Cardiovascular Report (Signed)
Summary: Office Visit   Office Visit   Imported By: Roderic Ovens 04/20/2010 15:49:58  _____________________________________________________________________  External Attachment:    Type:   Image     Comment:   External Document

## 2010-05-09 LAB — POCT I-STAT, CHEM 8
Creatinine, Ser: 1 mg/dL (ref 0.4–1.5)
HCT: 47 % (ref 39.0–52.0)
Hemoglobin: 16 g/dL (ref 13.0–17.0)
Sodium: 139 mEq/L (ref 135–145)
TCO2: 25 mmol/L (ref 0–100)

## 2010-05-12 LAB — PROTIME-INR: INR: 1.8 — ABNORMAL HIGH (ref 0.00–1.49)

## 2010-05-13 ENCOUNTER — Encounter (HOSPITAL_BASED_OUTPATIENT_CLINIC_OR_DEPARTMENT_OTHER): Payer: Medicare Other | Admitting: Internal Medicine

## 2010-05-13 ENCOUNTER — Other Ambulatory Visit (HOSPITAL_COMMUNITY): Payer: Self-pay | Admitting: Oncology

## 2010-05-13 DIAGNOSIS — Z7901 Long term (current) use of anticoagulants: Secondary | ICD-10-CM

## 2010-05-13 DIAGNOSIS — D751 Secondary polycythemia: Secondary | ICD-10-CM

## 2010-05-13 DIAGNOSIS — D649 Anemia, unspecified: Secondary | ICD-10-CM

## 2010-05-13 LAB — CBC WITH DIFFERENTIAL/PLATELET
BASO%: 0.4 % (ref 0.0–2.0)
EOS%: 1.7 % (ref 0.0–7.0)
HCT: 36.7 % — ABNORMAL LOW (ref 38.4–49.9)
LYMPH%: 21.7 % (ref 14.0–49.0)
MCH: 23 pg — ABNORMAL LOW (ref 27.2–33.4)
MCHC: 29.7 g/dL — ABNORMAL LOW (ref 32.0–36.0)
NEUT%: 55.5 % (ref 39.0–75.0)
RBC: 4.74 10*6/uL (ref 4.20–5.82)
lymph#: 1.8 10*3/uL (ref 0.9–3.3)

## 2010-05-17 LAB — DIFFERENTIAL
Eosinophils Relative: 5 % (ref 0–5)
Lymphocytes Relative: 20 % (ref 12–46)
Lymphs Abs: 2 10*3/uL (ref 0.7–4.0)
Monocytes Absolute: 1.3 10*3/uL — ABNORMAL HIGH (ref 0.1–1.0)
Monocytes Relative: 13 % — ABNORMAL HIGH (ref 3–12)
Neutro Abs: 6.2 10*3/uL (ref 1.7–7.7)

## 2010-05-17 LAB — PROTIME-INR: INR: 4.2 — ABNORMAL HIGH (ref 0.00–1.49)

## 2010-05-17 LAB — BASIC METABOLIC PANEL
BUN: 13 mg/dL (ref 6–23)
Chloride: 102 mEq/L (ref 96–112)
Chloride: 98 mEq/L (ref 96–112)
GFR calc Af Amer: >60 mL/min (ref >60)
GFR calc non Af Amer: 52 mL/min — ABNORMAL LOW (ref >60)
GFR calc non Af Amer: 54 mL/min — ABNORMAL LOW (ref >60)
Glucose, Bld: 130 mg/dL — ABNORMAL HIGH (ref 70–99)
Potassium: 3.7 mEq/L (ref 3.5–5.1)
Potassium: 3.9 mEq/L (ref 3.5–5.1)
Sodium: 134 mEq/L — ABNORMAL LOW (ref 135–145)
Sodium: 137 mEq/L (ref 135–145)

## 2010-05-17 LAB — CBC
HCT: 39.9 % (ref 39.0–52.0)
HCT: 44.3 % (ref 39.0–52.0)
HCT: 44.9 % (ref 39.0–52.0)
Hemoglobin: 13.5 g/dL (ref 13.0–17.0)
Hemoglobin: 14.9 g/dL (ref 13.0–17.0)
Hemoglobin: 15.6 g/dL (ref 13.0–17.0)
MCV: 92.2 fL (ref 78.0–100.0)
Platelets: 265 10*3/uL (ref 150–400)
Platelets: 288 10*3/uL (ref 150–400)
RBC: 4.79 MIL/uL (ref 4.22–5.81)
RDW: 13.6 % (ref 11.5–15.5)
RDW: 13.8 % (ref 11.5–15.5)
WBC: 10.1 10*3/uL (ref 4.0–10.5)
WBC: 7.8 10*3/uL (ref 4.0–10.5)
WBC: 9.1 10*3/uL (ref 4.0–10.5)

## 2010-05-17 LAB — COMPREHENSIVE METABOLIC PANEL
AST: 29 U/L (ref 0–37)
Albumin: 4 g/dL (ref 3.5–5.2)
BUN: 15 mg/dL (ref 6–23)
Chloride: 103 mEq/L (ref 96–112)
Creatinine, Ser: 1.48 mg/dL (ref 0.4–1.5)
GFR calc Af Amer: 56 mL/min — ABNORMAL LOW (ref >60)
Total Protein: 6.5 g/dL (ref 6.0–8.3)

## 2010-05-17 LAB — CARDIAC PANEL(CRET KIN+CKTOT+MB+TROPI)
CK, MB: 3.7 ng/mL (ref 0.3–4.0)
Relative Index: 2.3 (ref 0.0–2.5)

## 2010-05-17 LAB — BRAIN NATRIURETIC PEPTIDE
Pro B Natriuretic peptide (BNP): 220 pg/mL — ABNORMAL HIGH (ref 0.0–100.0)
Pro B Natriuretic peptide (BNP): 262 pg/mL — ABNORMAL HIGH (ref 0.0–100.0)

## 2010-05-18 ENCOUNTER — Encounter: Payer: Medicare Other | Admitting: *Deleted

## 2010-05-19 ENCOUNTER — Encounter: Payer: Medicare Other | Admitting: *Deleted

## 2010-05-19 LAB — COMPREHENSIVE METABOLIC PANEL
ALT: 19 U/L (ref 0–53)
AST: 27 U/L (ref 0–37)
Albumin: 4 g/dL (ref 3.5–5.2)
Calcium: 9.5 mg/dL (ref 8.4–10.5)
Chloride: 104 mEq/L (ref 96–112)
Potassium: 4.1 mEq/L (ref 3.5–5.3)

## 2010-05-19 LAB — LACTATE DEHYDROGENASE: LDH: 159 U/L (ref 94–250)

## 2010-05-21 ENCOUNTER — Ambulatory Visit (INDEPENDENT_AMBULATORY_CARE_PROVIDER_SITE_OTHER): Payer: Medicare Other | Admitting: *Deleted

## 2010-05-21 DIAGNOSIS — I4891 Unspecified atrial fibrillation: Secondary | ICD-10-CM

## 2010-05-21 LAB — POCT INR: INR: 1.3

## 2010-05-31 ENCOUNTER — Ambulatory Visit (INDEPENDENT_AMBULATORY_CARE_PROVIDER_SITE_OTHER): Payer: Medicare Other | Admitting: *Deleted

## 2010-05-31 DIAGNOSIS — I4891 Unspecified atrial fibrillation: Secondary | ICD-10-CM

## 2010-05-31 LAB — POCT INR: INR: 2.3

## 2010-06-10 ENCOUNTER — Encounter: Payer: Self-pay | Admitting: Internal Medicine

## 2010-06-15 NOTE — Consult Note (Signed)
[name], DEPASCALE NO.:  [id number]   MEDICAL RECORD NO.:  [id number]          PATIENT TYPE:  INP   LOCATION:  2031                         FACILITY:  [facility]   PHYSICIAN:  Jordan Hawks. Elnoria Howard, MD    DATE OF BIRTH:  06/23/1932   DATE OF CONSULTATION:  10/18/2006  DATE OF DISCHARGE:                                 CONSULTATION   REFERRING PHYSICIAN:  Dr. Susa Griffins.   REASON FOR CONSULTATION:  Epigastric pain/chest pain.   HISTORY OF PRESENT ILLNESS:  This is a 77 year old gentleman with a past  medical history of coronary artery disease, status post bypass graft,  myocardial infarction, gastroesophageal reflux disease and  hyperlipidemia, who was admitted to the hospital with complaints of  chest pain.  The patient states that his chest pain/epigastric pain  started rather acutely.  He is uncertain why this pain had started at  that time.  He denies any precipitating factors.  The patient states  that his symptoms are similar to that of his prior gastroesophageal  reflux disease, but because of the intensive pain, he presented to the  emergency room for further evaluation and treatment.  Subsequently, the  patient was admitted to the Cardiology Service and underwent a cardiac  catheterization which was negative for any acute changes in his  angiograms.  In the past, I had evaluated the patient and he was noted  to have reflux disease and he is responded well to the use of Zantac,  sucralfate and a PPI.  In the past, the patient does report undergoing  an EGD by Dr. Virginia Rochester; unfortunately, I do not have the records.  He  currently denies any shortness of breath or dysphagia.   PAST MEDICAL AND SURGICAL HISTORY:  As stated above.   FAMILY HISTORY:  Noncontributory.   SOCIAL HISTORY:  Negative for alcohol, tobacco or illicit drug use.   ALLERGIES:  Allergies to NEXIUM and CELEBREX.   MEDICATIONS:  1. Asthma.  2. Carvedilol.  3. Plavix.  4. Valium.  5.  Colace.  6. Insulin sliding scale.  7. Protonix.  8. Zocor.  9. Sucralfate.  10.Tylenol.  11.Xanax,.  12.Famotidine.  13.Hydrocortisone.  14.Imodium.  15.Zofran.  16.Ambien.   REVIEW OF SYSTEMS:  negative for this 11-point review of systems,  otherwise unless stated in the history present illness.   PHYSICAL EXAMINATION:  VITAL SIGNS:  Blood pressure is a 111/69, heart  rate is 67, temperature is 98.1, respirations 20.  GENERAL:  The patient is in no acute distress, alert and oriented.  HEENT:  Normocephalic, atraumatic.  Extraocular muscles intact.  NECK:  Supple.  No lymphadenopathy.  LUNGS:  Clear to auscultation bilaterally.  CARDIOVASCULAR:  Regular rate and rhythm.  ABDOMEN:  Soft, nontender and non-distended.  No epigastric tenderness  at this time.  EXTREMITIES:  No clubbing, cyanosis or edema.   LABORATORY VALUES:  White blood cell count 6.7, hemoglobin 14.6,  platelets at 228,000.  PT is 14.3, INR 1.1.  Sodium 138, potassium 3.9,  chloride is 109, CO2 24, glucose 114, BUN is 9, creatinine 1.1.  Lactic  acid level was 1.1.   IMPRESSION:  1. Probable gastroesophageal reflux disease exacerbation.  2. Coronary artery disease.   The patient is currently on maximal acid suppression medications.  I am  uncertain about  why he may have had an exacerbation at this time;  however, it does not appear that he has any cardiac source for his  discomfort.  An esophagogastroduodenoscopy apparently was performed on  him in the past, but he cannot recall the exact finding and there is no  the esophagogastroduodenoscopy report in E-chart.   PLAN:  Plan at this time is to resume his previous regimen of Zantac,  PPI and sucralfate.  The patient will follow up with me in the office  this coming Monday and further evaluation and treatment pending his  followup      Jordan Hawks. Elnoria Howard, MD  Electronically Signed     PDH/MEDQ  D:  10/18/2006  T:  10/19/2006  Job:  [id number]   cc:    Gerlene Burdock A. Alanda Amass, M.D.

## 2010-06-15 NOTE — Cardiovascular Report (Signed)
[name], [name]:  [id number]   MEDICAL RECORD [name]:  [id number]          PATIENT TYPE:  INP   LOCATION:  2031                         FACILITY:  [facility]   PHYSICIAN:  Richard A. Alanda Amass, M.D.DATE OF BIRTH:  [birth year]   DATE OF PROCEDURE:  10/17/2006  DATE OF DISCHARGE:                            CARDIAC CATHETERIZATION   PROCEDURE:  Retrograde central aortic catheterization, selective  coronary angiography via Judkins technique, saphenous vein graft  angiography via Judkins technique, LV angiogram, RAO/LAO projection,  abdominal aortic angiogram, midstream PA projection, hand injection.   PROCEDURE:  The patient was brought to the second floor CP lab in the  postabsorptive state after premedication with 5 mg Valium p.o.  premedication.  Informed consent was obtained to proceed with diagnostic  catheterization along with audio and video taping.  The right groin was  prepped, draped in the usual manner.  1% Xylocaine was used for local  anesthesia.  Because of back discomfort, a pillow was placed under the  right knee area and under his head for comfort.  The patient was given a  total of 4 mg of Versed in divided doses in the lab for sedation.  The  CRFA was entered with single anterior puncture using the 18 thin-wall  needle and a 6-French short sidearm sheath were inserted without  difficulty.  Using guidewire exchange for catheters exchange, selective  coronary angiography was done with 6-French 4-cm tapered Cordis  preformed coronary and pigtail catheters.  Saphenous vein graft  angiography was done with the right coronary catheter with a single  saphenous vein graft to the LAD visualized.  LV angiogram was done in  the RAO and LAO projection at 25 mL 14 mL per second and 20 mL 12 mL per  second through the Medrad diastolic injector.  Pullback pressure in CA  was performed that showed [name] gradient across the aortic valve.  Abdominal aortic angiogram  was done by hand injection in the mid PA  projection above the level of the renal arteries.  It demonstrated  single patent left renal artery and dual patent right renal arteries.  Catheters were removed.  Sidearm sheath was flushed.  The patient was  transferred to the holding area for sheath removal and pressure  hemostasis.  He tolerated the procedure well.   Pressures:  LV:  130/0; LVEDP 16-18 mmHg.   CA:  130/70 mmHg.   There was [name] gradient across the aortic valve on catheter pullback.   Fluoroscopy demonstrated 2-3+ calcification of the proximal LAD,  circumflex, and right coronary artery.  There was mild mitral annular  calcification.  The patient's ICD was visualized in the left lower  quadrant.  There were two epicardial patches, one on the LV and one the  artery that were visualized and a total of four wires, with a wire going  to each patch and two epicardial leads well-visualized.   Native coronary angiography revealed a small left main with heavy  calcification without high-grade stenosis.  The LAD was totally occluded  in its very proximal portion with [name] antegrade filling, and this was  an  old finding.   The circumflex artery had daylight 100% occlusion just after a very  small OM1 branch proximally and then segmental 70% calcific narrowing of  the proximal circumflex.  There was antegrade filling by bridging  collaterals to the circumflex which was a dominant vessel.  The second  marginal branch was moderately long and showed 100% daylight lesion in  its proximal third which was unchanged with antegrade filling via  bridging collaterals.  There was another 70-80% lesion in the midportion  of this branch.  There were irregularities beyond this in the AV groove  circ but [name] high-grade stenosis and the distal vessel gave rise to a  large PLA and a large PDA that were widely patent.   The right coronary artery was totally occluded in its proximal third  with [name]  antegrade filling.   The saphenous vein graft to the LAD was widely patent with an excellent  anastomosis to the mid-LAD after the second diagonal branch.  There was  retrograde filling of two moderate size diagonal branches.  The most  superior one had a 60-70% narrowing in the proximal portion which was  unchanged.  Retrograde to this, there was 70% narrowing of the LAD  before a large septal perforator.  The septal perforator trifurcated and  provided collaterals to a large RV bifurcating marginal branch and to  the distal circumflex.  The distal LAD provided collaterals to the  circumflex.   The native LAD behind the graft insertion was widely patent with  excellent flow and a large vessel.  The previously placed tandem DES  Cypher stents from October 29, 2002 were widely patent with less than  10% narrowing.  Several small diagonal branches were visualized in the  distal third of the LAD.  This was essentially unchanged from post-  stenting angiogram after PCI of October 29, 2002.   LV angiogram in the RAO and LAO projection showed moderately severe  global hypokinesis.  There was akinesis of a small area of the apex and  hypo-akinesis of the basilar third of the inferior wall.  There was  remaining contraction visualized in the other segments.  There was [name]  significant mitral regurgitation, and estimated EF was approximately  40%.   DISCUSSION:  [name] is a 77 year old divorced F2 and two grandchildren.  He works full-time for [employer] and  has about 9 homeless children  under his care.  He has a remote history of bypass in 43 in another  institution in [place].  He became a patient of ours approximately 15-17  years ago.  He has a history of NSVT and had a left abdominal single  chamber ICD implant at that time.  He has had EOL generator changes in  1993, 1997, and the last one in 2004 at [facility].  He currently has a  Medtronic Lowry VR single chamber 7320 device in  place from October 31, 2002.   He has had prior catheterization showing patent SVG to LAD long-term.  At his last cath of September of 2004, he had 90% calcific stenosis of  the mid-LAD beyond the graft insertion, and this was stented with  overlapping 2.5/18 and 3.0/13 Cypher stents postdilated the 3.0.  These  are widely patent on this study.  The graft has [name] significant disease  with an excellent anastomosis and is the only graft that was done.  He  has good collaterals to the distal circ in the RV marginal branch  of the  nondominant RCA.  He has total essentially daylight total occlusion of  the proximal circumflex and proximal OM with bridging collaterals, and  this is unchanged from his prior angiogram of September of 2004.  EF is  essentially unchanged as well, and it is approximately 40%.   The patient had symptoms of progressive dyspnea on exertion for the last  several months and was seen as an outpatient.  He had [name] significant  chest pain at that time, and medication was adjusted and BNP in the past  had been less than 100.  He was admitted to the hospital on this  occasion with intermittent abdominal discomfort, reflux symptoms,  shortness of breath, and vague chest pain.  He was scheduled to get an  outpatient Cardiolite but this had not been accomplished prior to this  admission.  Myocardial infarction was ruled out by serial enzymes and  EKGs, and on medical therapy, the patient had [name] significant residual  abdominal discomfort.  He has had remote cholecystectomy.  His only ICD  discharges have been remote when he had episode of atrial flutter with  rapid VR many years ago which has not recurred, and that occurred in the  setting of cholecystitis.  He has a narrow QRS and is not a candidate  for BiV ICD, and he has a backup single chamber ICD which is functioning  normally in the LLQ.  I would recommend continued medical therapy.  Encourage GI therapy, and he may  require a GI consultation particularly  if he has recurrent symptoms.   CATHETERIZATION DIAGNOSES:  1. Arteriosclerotic heard disease.  Remote coronary artery bypass      graft with saphenous vein graft to left anterior descending in      1983.  2. Nonsustained ventricular tachycardia with single chamber      implantable cardioverter-defibrillator implant in 1990; end-of-life      generator change in 1993, 1997, and last September of 2004.  [name]      recent discharges.  Epicardial leads, left lower quadrant      placement.  3. Systemic hypertension.  4. Hyperlipidemia.  5. Exogenous obesity.  6. Dyspnea on exertion.  7. Abdominal discomfort and gastroesophageal reflux disease symptoms.  8. Remote cholecystectomy.  9. Widely patent tandem DES left anterior descending stent, mid-left      anterior descending beyond saphenous vein graft insertion.  10.Total occlusion of proximal left anterior descending and proximal      second obtuse marginal with bridging collaterals and good antegrade      flow.  [name] change from old study.  11.Left ventricular dysfunction, ejection fraction approximately 40%.      Richard A. Alanda Amass, M.D.  Electronically Signed     RAW/MEDQ  D:  10/17/2006  T:  10/17/2006  Job:  [id number]   cc:   Record room  CP Lab  Samul Dada, M.D.  Olene Craven, M.D.  Cath Lab

## 2010-06-15 NOTE — Cardiovascular Report (Signed)
NAMEAMADEO, COKE NO.:  [id number]   MEDICAL RECORD NO.:  [id number]          PATIENT TYPE:  INP   LOCATION:  2031                         FACILITY:  [facility]   PHYSICIAN:  Richard A. Alanda Amass, M.D.DATE OF BIRTH:  1933/01/30   DATE OF PROCEDURE:  DATE OF DISCHARGE:                            CARDIAC CATHETERIZATION   ADDENDUM TO DICTATION [phone]   ADDENDUM:  The patient also carries a diagnosis of polycythemia.  He has  been treated with intermittent phlebotomies in the past but none  recently.  His H&H on admission is 16.1/47.5, white count 7.2, and  platelet count 249.      Richard A. Alanda Amass, M.D.  Electronically Signed     RAW/MEDQ  D:  10/17/2006  T:  10/17/2006  Job:  [id number]   cc:   Samul Dada, M.D.  Olene Craven, M.D.

## 2010-06-15 NOTE — Discharge Summary (Signed)
NAMEJATAVIOUS, PEPPARD NO.:  [id number]   MEDICAL RECORD NO.:  [id number]          PATIENT TYPE:  INP   LOCATION:  2031                         FACILITY:  [facility]   PHYSICIAN:  Richard A. Alanda Amass, M.D.DATE OF BIRTH:  Nov 12, 1932   DATE OF ADMISSION:  10/16/2006  DATE OF DISCHARGE:  10/18/2006                               DISCHARGE SUMMARY   Mr. Schoenfelder is a 77 year old, white, widowed male patient of Dr.  Alanda Amass with known coronary artery disease.  He had a CABG in 1983.  His last cath was in September 2004 at which time he had a total  circumflex on an RCA, he had a distal LAD Cypher stent placed for a 95%  blockage. His SVG to his LAD was patent.  He had an EF of 35%.  He came  to the emergency room with complaints of worsening reflux, abdominal  pain and shortness of breath.  He could not walk out to his garbage cans  without shortness of breath. His indigestion the night prior to  admission was the worst it had been yet. He has been being seen by Dr.  Elnoria Howard who changed his medications without any help, thus he came to the  emergency room. He was seen by Dr. Yates Decamp. On admission the patient  stated that his GI troubles were the same as prior to his stent of his  LAD thus we wondered if this was not angina equivalent so he was  recommended for cardiac catheterization.  He was given a Lovenox  injection. He was put on IV nitroglycerin and the following day he  underwent cardiac catheterization on October 17, 2006 by Dr. Susa Griffins. He apparently had no changes in his cardiac cath. He does  have three-vessel disease.  His SVG to his LAD was patent.  He did have  two stents in his LAD which were patent. His RCA was totaled. He did  have her bridging collaterals.  He had disease scattered in his  circumflex and OM 1 and 2. Medical treatment was decided. Dr. Alanda Amass  felt if he had recurrent symptoms of his GI problems, he should have a  GI consult.  His EF was 40%. The following day the patient stated he was  actually feeling better.  He still had some abdominal pain but it was  not anywhere near as severe as when he was came to the hospital.  However, he also will was not eating very much and only had 2 meals. It  was decided to call Dr. Elnoria Howard. Dr. Elnoria Howard recommended felt that he probably  had a GERD exasperation and since he was getting along okay he could be  discharged home and Dr. Elnoria Howard would follow him up in the office on  Monday, thus he was discharged home.   MEDICATIONS:  1. Coreg 12.5 mg twice a day.  2. Plavix 75 mg a day.  3. Tricor 145 mg a day.  4. Crestor 5 mg 3  times per week.  5. Aspirin 81 mg 2 a day.  6. Protonix 40 mg every day.  7.  __________  150 mg p.r.n.  8. Lisinopril/HCTZ 10/12.5 daily.  9. __________  1 gram b.i.d.  10.Tylenol p.r.n.   LABORATORY DATA:  CK-MB and troponins were all negative.  His hemoglobin  was 14.6, hematocrit was 43, platelets were 228 and WBCs were 6.7. His  sodium was 138, potassium was 2.9, BUN was 9, creatinine was 1.10.  His  glucose was 114. His blood pressure on the day of discharge was 111/69.  His pulse was 67.  His respirations were 20 and his temperature was  98.1.   DISCHARGE DIAGNOSES:  1. Gastroesophageal reflux disease exacerbation.  2. Dyspnea on exertion and shortness of breath thought secondary to      abdominal bloating. He also has chronic complaints, all resolved at      the time of discharge.  3. Coronary artery disease status post cath, no intervention,      scattered disease in his circumflex and his obtuse marginal 2 with      collaterals. His stents in his left anterior descending were      patent.  His saphenous vein graft to his left anterior descending      was patent.  4. Ischemic cardiomyopathy with an ejection fraction of 40%.  5. Non-insulin-dependent diabetes mellitus.  6. Hypertension.  7. Dyslipidemia.  He has very low HDL and because of  his      gastroesophageal reflux disease we were unable to put him on      Niaspan. He will followup with Dr. Alanda Amass in the office and he      should not do any lifting or strenuous activity for 1 week.      Lezlie Octave, N.P.      Richard A. Alanda Amass, M.D.  Electronically Signed    BB/MEDQ  D:  10/18/2006  T:  10/19/2006  Job:  [id number]   cc:   Jordan Hawks. Elnoria Howard, MD  Olene Craven, M.D.

## 2010-06-15 NOTE — H&P (Signed)
NAMEGAYLE, COLLARD NO.:  [id number]   MEDICAL RECORD NO.:  [id number]          PATIENT TYPE:  INP   LOCATION:  4735                         FACILITY:  [facility]   PHYSICIAN:  Elmore Guise., M.D.DATE OF BIRTH:  03/20/1932   DATE OF ADMISSION:  02/11/2008  DATE OF DISCHARGE:                              HISTORY & PHYSICAL   INDICATION FOR ADMISSION:  Rapid atrial fibrillation.   PRIMARY CARE PHYSICIAN:  [name], M.D. of [facility]   HISTORY OF PRESENT ILLNESS:  Mr. Yon is a very pleasant 77 year old  white male with a past medical history of coronary artery disease  (status post coronary artery bypass grafting in 1983), ischemic  cardiomyopathy (EF 35-40%), gastroesophageal reflux disease, history of  ventricular tachycardia status post ICD implant, dyslipidemia, who  initially presented on January 29, 2008 to establish new cardiology  care.  At that time, he was doing well.  He did report a mild increase  in his exertional dyspnea but denied any orthopnea or PND.  At that  time, he also noticed that his energy was a little lower.  However, at  that visit, he was noted to have atrial fibrillation with heart rate in  the 130-140 range.  I discussed inpatient versus outpatient therapy, and  we both agreed that we would try to slow him down as an outpatient.  The  patient was started on low-dose Coumadin at that time and presented back  for office visit approximately 1 week later.  When he came back for is  repeat office visit, his heart rate had improved down to the 80 beats  per minute range.  However, he continued to have a little shortness of  breath.  He was placed on Multaq 400 mg twice daily.  Unfortunately, he  was unable to tolerate his Multaq secondary to abdominal pain.  He also  had abdominal pain and loose stools with digoxin, so both of these were  discontinued.  Today, he presents with increasing exertional dyspnea,  fatigue and tiredness.  His heart rate is back in the 130-140 range.  He  will be admitted for rate control and possible Tikosyn loading.  He does  report that he has been having some dark stools over the last couple of  days.  He has been also having some central abdominal pain that would  come and go.  This did improve after the patient had a bowel movement.  He has had no lower extremity edema.  No fever or cough.  He has  tolerated his other medicines fairly well.   REVIEW OF SYSTEMS:  As per HPI.  All others are negative.   CURRENT MEDICATIONS:  1. Coreg 12.5 mg twice daily.  2. Plavix 75 mg daily.  3. Tricor 145 mg daily.  4. Aspirin 81 mg daily.  5. Lasix 20 mg daily.  6. Potassium 20 mEq daily.  7. Lisinopril 10 mg daily.  8. Protonix 40 mg daily.  9. Ultram p.r.n.  10.Sustain eye drops p.r.n.  11.Fluticasone nasal spray twice daily.  12.Claritin 10 mg daily.  13.Coumadin  5 alternating with 2.5 mg daily.   ALLERGIES/INTOLERANCES:  1. MULTAQ with stomachaches, pains.  2. DIGOXIN with stomachaches, pains.   FAMILY HISTORY:  Positive for heart disease, hypertension, stroke and  diabetes.   SOCIAL HISTORY:  The patient is divorced.  He is retired from [employer].  Currently does not exercise.  Smoked for 40 pack years; quit 34  years ago.  Has occasional wine intake.   PAST SURGICAL HISTORY:  1. Bypass in 1983.  2. ICD implant in 1990 with generator change out x4.  3. He had a stent placement in 2005.  4. Cholecystectomy in the past.   PHYSICAL EXAMINATION:  VITAL SIGNS:  His weight is 215.  His blood  pressure 120/80.  His heart rate is 130-140 and irregular.  GENERAL:  He is a very pleasant, elderly white male, alert and oriented  x4 in no acute distress.  HEENT:  Appear normal.  NECK:  Supple.  No lymphadenopathy.  2+ carotids.  No JVD.  No bruits.  LUNGS:  Clear with good breath sounds at the bases.  HEART:  Irregular irregular with 2/6 holosystolic murmur  noted.  ABDOMEN:  Soft, nontender, nondistended.  No rebound or guarding.  EXTREMITIES:  Warm with no significant edema.  SKIN:  Warm and dry with no evidence of rashes or bruising.  NEUROLOGIC:  He has no focal deficits noted.   His ECG today shows atrial fibrillation, rate of 131 per minute, with  nonspecific ST-T wave changes.  His last cath was done October 17, 2006.  This showed a very small left main with a proximally occluded  LAD.  Circumflex had 100% occlusion right after a small OM1 branch.  The  right coronary was totally occluded also.  Vein graft to the LAD was  widely patent with excellent anastomosis to the mid LAD.  There was  retrograde filling to 2 moderate sized diagonal branches with the upper  diagonal having a 60% proximal stenosis.  He had 2 patent stents in the  distal LAD after his vein graft touchdown.  His circumflex fills via  collaterals.  His EF at that time was 40%.   IMPRESSION:  1. Rapid atrial fibrillation.  2. History of coronary artery disease.  3. History of ischemic cardiomyopathy (ejection fraction 35-40%).  4. Dyslipidemia.   PLAN:  1. The patient will be admitted to the hospital.  He has essentially      failed outpatient therapy.  We will start him on a Cardizem 20 mg      IV bolus followed by a Cardizem drip.  We will check routine blood      work on admission including CBC, CMP, magnesium level, as well as      coags.  He will also have a repeat echo and a chest x-ray.      He has been supratherapeutic on his INR on the last two checks      here, but has only been on Coumadin since January 29, 2008.  I      will discuss pros and cons of Tikosyn therapy with him further      after we achieve better rate control.  All his questions were      answered.      Elmore Guise., M.D.  Electronically Signed     TWK/MEDQ  D:  02/11/2008  T:  02/11/2008  Job:  [id number]   cc:   [name], M.D.

## 2010-06-15 NOTE — Discharge Summary (Signed)
[name], [name] NO.:  [id number]   MEDICAL RECORD NO.:  [id number]          PATIENT TYPE:  INP   LOCATION:  4735                         FACILITY:  [facility]   PHYSICIAN:  Elmore Guise., M.D.DATE OF BIRTH:  1933/01/22   DATE OF ADMISSION:  02/11/2008  DATE OF DISCHARGE:  02/13/2008                               DISCHARGE SUMMARY   DISCHARGE DIAGNOSES:  1. Atrial fibrillation.  2. History of coronary artery disease.  3. History of left ventricular dysfunction.  4. Hypertension.  5. Dyslipidemia.   HISTORY OF PRESENT ILLNESS:  Mr. Duerson is a very pleasant 77 year old  white male who presented to the office for new patient evaluation.  He  was found to be in newly diagnosed atrial fibrillation.  We tried  outpatient management, however, he continued to have difficulty with  rate control.  He was admitted for further treatment.   HOSPITAL COURSE:  The patient's hospital course was uncomplicated.  He  ruled out for myocardial infarction.  He was placed on Cardizem drip  with excellent rate control.  He was converted to oral Cardizem 120 mg  twice daily and his heart rate has ranged from 50-100 with heart rate  increasing to 111 during exertion while walking with cardiac rehab.  His  O2 sats have been stable.  Since he is essentially asymptomatic after  rate control was achieved, we elected not to start any antiarrhythmic  agents at this time.  He did have difficulty with Multaq in the past  causing stomach pain and upset.  He has been up and ambulatory.  His  heart rate has been well controlled for the last 36 hours and he has  been off his Cardizem drip for that long.  He did have an echo during  his hospitalization, which showed an EF of 40-45%.  He had no  significant valvular abnormalities.  He will be discharged home today to  continue the following medications:  1. Coreg 12.5 mg in the morning and 25 at night.  2. Plavix 75 mg daily.  3. TriCor  145 mg daily.  4. Aspirin 81 mg daily.  5. Lasix 20 mg daily.  6. Potassium 20 mEq daily.  7. Lisinopril 10 mg daily.  8. Protonix 40 mg daily.  9. Ultram p.r.n.  10.Systane eye drops p.r.n.  11.Fluticasone nasal spray twice daily.  12.Claritin 10 mg daily.  13.Cardizem CD 120 mg twice daily (new medication for him).   Because of continued elevated INR, INR on discharge was 4.0.  His  Coumadin will be held until he is seen back in the office.  He should  have no cardiac restrictions.  He should also try to avoid high-salty  foods.  I would like to see him back in the office in 1 week.  He is to  call the office if he has any further problems or concerns.  I did  discuss should he have difficulty with lightheadedness on standing after  taking a full dose of his Coreg, he can cut his  evening dose of Coreg to a half dose as  he deems necessary.  All his  questions were answered prior to discharge.  Because of his mild LV  dysfunction, he does know to weigh himself daily.  Should his weight  increase more than 2-3 pounds in a 24-hour period, he is to take an  extra dose of his Lasix.      Elmore Guise., M.D.  Electronically Signed     TWK/MEDQ  D:  02/13/2008  T:  02/13/2008  Job:  [id number]   cc:   [name], M.D.

## 2010-06-18 NOTE — Discharge Summary (Signed)
[name], [name]                        ACCOUNT NO.:  [id number]   MEDICAL RECORD NO.:  [id number]                   PATIENT TYPE:  INP   LOCATION:  4733                                 FACILITY:  [facility]   PHYSICIAN:  Richard A. Alanda Amass, M.D.          DATE OF BIRTH:  01/13/1933   DATE OF ADMISSION:  10/28/2002  DATE OF DISCHARGE:  11/01/2002                                 DISCHARGE SUMMARY   DISCHARGE DIAGNOSES:  1. Unstable angina status post left anterior descending Cypher stenting this     admission.  2. Known coronary disease, coronary artery bypass grafting in 1983 with an     saphenous vein graft to the left anterior descending.  This was patent     this admission.  3. Ischemic cardiomyopathy with an ejection fraction of 35%.  4. History of ICD in 1990 with generator change in 1997 and again this     admission for end-of-life October 31, 2002 by Doylene Canning. [name], M.D.  5. Non-insulin-dependent diabetes.  6. Dyslipidemia.  7. Borderline low TSH.  8. Gastroesophageal reflux in the past, seen by Georgiana Spinner, M.D.   HOSPITAL COURSE:  [name] is a 77 year old man who had remote bypass in  [place] in 1983.  He had nonsustained ventricular tachycardia 1990 and  underwent ICD insertion.  He had a device change in 1993 and again in 1997.  He was admitted to the hospital after an abnormal Cardiolite study and a  history of chest pain.  The patient had diagnostic catheterization October 29, 2002 by Richard A. Alanda Amass, M.D. which showed a high grade native LAD  disease after the SVG to LAD insertion.  This was dilated and stented with  Cypher stent.  The circumflex was totaled as was the OM.  There were left to  left collaterals.  The RCA was totaled with some right to right collaterals  to the RV branch.  He had normal renal arteries and a 50% left common iliac  artery.  EF was 35%.  The patient tolerated this well.  He underwent  evaluation by Doylene Canning. [name],  M.D. for ICD generator change.  This was done  electively October 31, 2002.  The patient tolerated this well.  Please see  Doylene Canning. [name], M.D. complete operative note for details.  He had a Marquis  [id number] generator placed in his left abdomen.  He tolerated this well and we  feel that he can be discharged November 01, 2002.   DISCHARGE MEDICATIONS:  1. Coated aspirin 81 mg two tablets daily.  2. Plavix 75 mg daily.  3. Coreg 12.5 mg b.i.d.  4. Lisinopril/hydrochlorothiazide 10/12.5 once a day.  5. TriCor 160 daily.  6. Lipitor 10 mg daily.  7. Zantac 75 mg b.i.d.  8. Coreg 12.5 mg b.i.d.  9. Keflex 500 mg q.i.d. for five days.  10.      Nitroglycerin sublingual p.r.n.  LABORATORIES:  His TSH this admission was 0.3.  Recent TSH in the office was  0.4.  White count 6.9, hemoglobin 12.8, hematocrit 37.9, platelets 227,000.  INR 1.1.  Sodium 139, potassium 3.7, BUN 7, creatinine 1.1.  Liver functions  are normal.  CKs did bump to 186 with an MB of 12.6.  Troponins were  negative x2.  Lipid profile showed a cholesterol of 106, triglycerides 188,  HDL 28, LDL 40.  Thyroid studies were abnormal with a T3 uptake of 53.1, TSH  0.34, free T4 of 2.  His TSH was repeated and is at 0.303.  Chest x-ray is  stable.  EKG shows sinus rhythm with occasional PVCs.   DISPOSITION:  The patient is discharged in stable condition and will see  Richard A. Alanda Amass, M.D. October 11 3:45 p.m.  Will need to follow up his  thyroid functions.  This could be done as an outpatient.      Abelino Derrick, P.A.                      Richard A. Alanda Amass, M.D.    [name]  D:  11/01/2002  T:  11/01/2002  Job:  [id number]   cc:   Olene Craven, M.D.  [address]  [address]  [place]  [place] [address]  Fax: [phone]   Doylene Canning. [name], M.D.   Samul Dada, M.D.  [address]  [place]  [place] [address]  Fax: [phone]   Georgiana Spinner, M.D.  [address]  [place]  [place]  [address]  Fax: [phone]

## 2010-06-18 NOTE — Op Note (Signed)
   NAME:  [name], [name]                        ACCOUNT NO.:  [id number]   MEDICAL RECORD NO.:  [id number]                   PATIENT TYPE:  AMB   LOCATION:  ENDO                                 [facility]ACILITY:  [facility]   PHYSICIAN:  Georgiana Spinner, M.D.                 DATE OF BIRTH:  06/06/1932   DATE OF PROCEDURE:  08/14/2002  DATE OF DISCHARGE:                                 OPERATIVE REPORT   PROCEDURE:  Upper endoscopy.   INDICATIONS:  GERD.   ANESTHESIA:  Demerol 60 mg, Versed 7 mg.   PROCEDURE:  With the patient mildly sedated in the left lateral decubitus  position, the Olympus videoscopic endoscope was inserted in the mouth,  passed under direct vision through the esophagus, which showed a  questionable area of short-segment Barrett's, photographed and biopsied.  We  entered into the stomach.  Fundus, body, antrum, duodenal bulb, second  portion of the duodenum all appeared normal.  From this point the endoscope  was slowly withdrawn, taking circumferential views of the duodenal mucosa  until the endoscope pulled back into the stomach, placed in retroflexion to  view the stomach from below.  The endoscope was straightened and withdrawn,  taking circumferential views of the remaining gastric and esophageal mucosa.  The patient's vital signs and pulse oximetry remained stable.  The patient  tolerated the procedure well without apparent complications.   FINDINGS:  Question of short-segment Barrett's esophagus, biopsied.   Await biopsy report.  The patient will call me for results and follow up  with me as an outpatient.  Proceed to colonoscopy as planned.                                               Georgiana Spinner, M.D.    GMO/MEDQ  D:  08/14/2002  T:  08/14/2002  Job:  [id number]

## 2010-06-18 NOTE — Op Note (Signed)
[name], [name]                        ACCOUNT NO.:  [id number]   MEDICAL RECORD NO.:  [id number]                   PATIENT TYPE:  INP   LOCATION:  4733                                 FACILITY:  [facility]   PHYSICIAN:  Doylene Canning. [name], M.D.               DATE OF BIRTH:  1932/03/27   DATE OF PROCEDURE:  10/31/2002  DATE OF DISCHARGE:                                 OPERATIVE REPORT   PROCEDURE:  Explantation of an old single-chamber implantable cardioverter  defibrillator (epicardial system), with the insertion of a new implantable  cardioverter defibrillator system with defibrillation testing.   CARDIOLOGIST:  Doylene Canning. [name], M.D.   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   INTRODUCTION:  The patient is a very pleasant 77 year old man who has known  coronary artery disease, status post myocardial infarction with an ejection  fraction of 35%.  He had sustained monomorphic VT initially in 1990, and  underwent an ICD insertion with an epicardial system placed at that time.  He underwent an initial ICD generator change in 1993, and again in 1997.  He  is now referred for an additional ICD change, as the device is at [facility].   DESCRIPTION OF PROCEDURE:  After an informed consent was obtained, the  patient was taken to the diagnostic EP laboratory in the fasting state.  After the usual preparation and draping, a total of 30 mL of lidocaine was  infiltrated into the left infraclavicular region.  A 9.0 cm incision was  carried out over this region, and electrocautery used to dissect down to the  old ICD system.  The generator was dissected free of adhesions, and very  careful attention was paid to minimizing manipulation of the pacing (rate  sensing leads).  These leads were CPI model #1114 leads, with the serial  #[id number] in the positive position, and the serial #[id number]  in the negative  position.  The leads were then tested, which demonstrated a pacing threshold  of two volts at 0.2 msec.  The pacing impedance was stable.  The R-waves  measured between 6-8 mV.  With the demonstration of satisfactory pacing leads, attention was then  turned to the defibrillation patches.  Each of these was interrogated and  with pacing impedances in the 300-400 range, all within normal limits.  At  this point the pocket was irrigated with kanamycin, and the new Medtronic  Marquis single-chamber defibrillator with the E-header was connected to the  defibrillation and pacing leads.  The device was placed back in the  subcutaneous pocket, where it was secured with silk suture.  Additional  kanamycin was utilized to irrigated the incision, and electrocautery  utilized to assure hemostasis.  At this point the patient was sedated for  defibrillation testing.  After the patient was more deeply sedated,  the VF was induced with a T-wave  shock.  The Medtronic model #7230VR single-chamber defibrillator, serial  [id number], charged up and delivered 12 joules, terminating ventricular  fibrillation, and restoring sinus rhythm.  At this point no additional DFT  testing was carried out, and the incision was closed with a layer of #2-0  Vicryl, followed by a layer of #3-0 Vicryl, followed by a layer of #4-0  Vicryl.  Benzoin was painted on the skin.  Steri-Strips were applied.  A  pressure dressing was placed.  The patient returned to his room in satisfactory condition.   COMPLICATIONS:  There were no immediate procedural complications.    RESULTS:  Demonstrated successful ICD generator removal and insertion of a  new Marquis [id number] Medtronic defibrillator in a patient with a history of  sustained monomorphic VT.  The defibrillation threshold was less than or  equal to 12 joules.                                                Doylene Canning. [name], M.D.    GWT/MEDQ  D:  10/31/2002   T:  10/31/2002  Job:  [id number]   cc:   Gerlene Burdock A. Alanda Amass, M.D.  [phone]  [place]  [place] [address]  Fax: [phone]   Kimberlee Nearing, M.D.   Kern Reap, M.D.

## 2010-06-18 NOTE — H&P (Signed)
NAME:  MATS, JEANLOUIS                        ACCOUNT NO.:  [id number]   MEDICAL RECORD NO.:  [id number]                   PATIENT TYPE:  EMS   LOCATION:  MAJO                                 FACILITY:  [facility]   PHYSICIAN:  Quita Skye. Waldon Reining, MD             DATE OF BIRTH:  October 11, 1932   DATE OF ADMISSION:  10/28/2002  DATE OF DISCHARGE:                                HISTORY & PHYSICAL   CARDIOLOGY ADMISSION NOTE   Atanacio Melnyk is a 77 year old white man who is admitted to [facility] for further evaluation of chest pain.   The patient has an extensive history of cardiac disease in 1983 he underwent  coronary artery bypass surgery.  He then underwent AICD placement for  ventricular tachycardia in 1990, then again in 1993 and then again in 1997.  His last cardiac catheterization was performed in May of 2003.  His cardiac  catheterization revealed a patent saphenous vein graft to the LAD with  multiple potential areas of ischemia.  Medical therapy was advised.   The patient recently discharge of his AICD and a cardiac catheterization was  scheduled for tomorrow in order to evaluate the current status of his  coronary anatomy.   The patient experienced an episode of chest pain at approximately 2 a.m.  this morning. He was in bed, but not sleeping.  The chest pain was described  as an epigastric and lower substernal burning. It did not radiate. It was  not associated with dyspnea, diaphoresis, or nausea. There were no  exacerbating or ameliorating factors. It appeared not to be related to  position, activity, meals, or respirations.  He took a combination of  nitroglycerin and antacids and the discomfort resolved after approximately  30 minutes. He is free of chest pain at this time; and is, otherwise,  asymptomatic.   The patient also has a history of gastroesophageal reflux, and he thinks  that today's chest pain might be his reflux discomfort.   The patient is  also noted to have an ischemic cardiomyopathy with an  ejection fraction in the range of 35%.   Other medical problems include hypertension, hyperlipidemia, and adult onset  diabetes mellitus.   The patient lives alone.  He is divorced. He is retired.   FAMILY HISTORY:  Family history is notable for cardiac disease (father and  mother).  There is also a history of diabetes mellitus.   PREVIOUS OPERATIONS:  Cholecystectomy.   SIGNIFICANT PREVIOUS INJURIES:  None.   ALLERGIES:  The patient is not allergic to any medications.   MEDICATIONS:  He is unsure of his medications at this time.   REVIEW OF SYSTEMS:  Reveals no new problems related to head, eyes, ears,  nose, mouth, throat, lungs, gastrointestinal system, genitourinary system,  or extremities.  There is no history of neurologic or psychiatric disorder.  There is no history of fever, chills, or weight loss.  PHYSICAL EXAMINATION:  VITAL SIGNS:  Blood pressure 135/81, pulse 94 and  regular, respirations 20, temperature 98.1.  GENERAL: The patient was an elderly white man in no discomfort.  He was  alert, oriented, and appropriate.  HEENT:  Head, eyes, ears nose, and mouth are unremarkable.  NECK:  The neck was without thyromegaly or adenopathy.  Carotid pulses were  palpable bilaterally without bruits.  CARDIOVASCULAR:  Cardiac examination revealed a normal a normal S1 and S2.  There was no S3, S4, murmur, rub, or click.  Cardiac rhythm was regular.  CHEST: No chest wall tenderness was noted.  LUNGS:  The lungs were clear.  ABDOMEN:  The abdomen was soft. There was mild epigastric tenderness on  palpation. There was no mass, hepatosplenomegaly, bruits, distention, or  rebound, guarding or rigidity.  Bowel sounds were normal.  RECTAL AND GENITOURINARY: Rectal and genital examinations were not performed  as they were not pertinent to the reason for acute care hospitalization.  EXTREMITIES:  No edema, deviation, or deformity.   Radial and pedal pulses  were palpable bilaterally.  NEUROLOGIC: Brief screening neurologic survey was unremarkable.   The electrocardiogram revealed normal sinus rhythm with occasional PVCs, low  voltage QRS across the precordium and nonspecific T wave abnormalities.  The  chest radiograph, according to the radiologist, revealed no evidence of  acute disease.  Initial CK/MB was less than 1.0 with a myoglobin of 138 and  troponin less than 0.05.  A second set revealed a CK/MB of less than 1.0  with a myoglobin of 117 and troponin less than 0.05.  The remaining studies  were pending at the time of this dictation.   IMPRESSION:  1. Chest pain rule out myocardial infarction.  2. Coronary artery disease, status post coronary artery bypass surgery in     1983, last catheterization March 2003 with a patent saphenous vein graft     to the LAD, but other areas of potential ischemia.  3. Status post automatic implantable cardioverter-defibrillators for     ventricular tachycardia placed in 1990, 1993 and 1997.  4. Ischemic cardiomyopathy.  Ejection fraction in the range of 35%.  5. Hypertension.  6. Hyperlipidemia.  7. Non-insulin-dependent diabetes mellitus.  8. Gastroesophageal reflux.   PLAN:  1. Telemetry.  2. Serial cardiac enzymes.  3. Aspirin.  4.     Intravenous heparin.  5. Nitro paste.  6. Further evaluation per Dr. Alanda Amass.                                                Quita Skye. Waldon Reining, MD    MSC/MEDQ  D:  10/28/2002  T:  10/28/2002  Job:  [id number]   cc:   Gerlene Burdock A. Alanda Amass, M.D.  [phone] [address]  [place]  [place] [address]  Fax: [phone]

## 2010-06-18 NOTE — Discharge Summary (Signed)
[facility]  Patient:    [name], HSIUNG Visit Number: [id number] MRN: [id number]          Service Type: MED Location: 2000 2031 01 Attending Physician:  Ruta Hinds Dictated by:   Raymon Mutton, P.A. Admit Date:  04/04/2001 Discharge Date: 04/06/2001   CC:         Richard A. Alanda Amass, M.D.   Discharge Summary  DATE OF BIRTH:  Jul 15, 1932  DISCHARGE DIAGNOSES: 1. Chest pain, unstable angina, ruled out for myocardial infarction, status    post cardiac catheterization on April 05, 2001. 2. Status post coronary artery bypass grafting 20 years ago. 3. Status post implantable cardioverter defibrillator secondary to ventricular    tachycardia. 4. Cardiomyopathy with ejection fraction 35%. 5. Hypertension. 6. Hyperlipidemia. 7. Adult-onset diabetes mellitus. 8. Gastroesophageal reflux disease. 9. Obesity.  HISTORY OF PRESENT ILLNESS:  Mr. Zoeller is a 77 year old, Caucasian gentleman who was seen in the emergency room at [facility] where he presented with complaints of chest pain spreading across his chest and shortness of breath with diaphoresis.  He denied nausea and vomiting.  The pain was relieved with sublingual nitroglycerin at the emergency room and GI cocktail. He was also given Lopressor IV 5 mg x3 that helped with increased blood pressure.  ASSESSMENT/PLAN:  The patient was admitted to the telemetry unit and started on IV heparin and continued on nitroglycerin paste.  LABORATORY DATA AND X-RAY FINDINGS:  Cardiac panel not indicative for any cardiac muscle damage.  CK total was 133, CK-MB 2.3 with relative index 1.7, troponin I 0.01.  Second set with CK 107, CK-MB 1.5 and troponin 0.05.  HOSPITAL COURSE:  The patient was scheduled for catheterization the next day on April 05, 2001.  Catheterization was performed by Dr. Tresa Endo.  During that catheterization, he was found to have a patent SVG to LAD and  multiple potential sites of ischemia throughout the coronary vessels.  For further details, refer to the dictations from that day which is not available to me at the time of patients discharge.  The patient tolerated the procedure well.  Recommendations were made to continue medical therapy and initiate ECP treatment.  The next day, the patient was found in stable condition and chest pain free. He did not complain of any shortness of breath.  He was scheduled for discharge home.  His groin site was stable.  He did not develop any complications after the catheterization.  He was discharged home on adjusted medical therapy.  DISCHARGE LABORATORY DATA AND X-RAY FINDINGS:  CBC revealed white blood cells 7, platelets 270, hemoglobin 13, hematocrit 39.  Basic metabolic panel showed sodium 139, potassium 4.0, BUN 10, creatinine 0.9, calcium 9.1, glucose 136. Fecal occult blood stool was negative.  EKG revealed normal sinus rhythm with incomplete left bundle branch block and third-degree AV block.  No specific ST and T wave abnormalities.  DISCHARGE MEDICATIONS: 1. Aspirin 325 mg q.d. 2. Tenormin 25 mg q.d. 3. Vasotec 10 mg q.d. 4. Glucotrol XL 50 mg q.d. 5. Prilosec 20 mg q.d. 6. Lopid 60 mg b.i.d. 7. Lipitor 20 mg q.d. 8. Nitroglycerin 0.4 mg as needed sublingual.  SPECIAL INSTRUCTIONS:  He was allowed to shower with wrapping groin site, pat dry.  The patient is to report any signs of bleeding, increased swelling, increased pain and oozing from the groin site to our office.  A phone number was provided.  DIET:  Low fat, low cholesterol diet.  ACTIVITY:  He is not to drive for 72 hours as well as not to engage in any physical activity or heavy lifting.  FOLLOWUP:  Our office will schedule the patient for ECP therapy in a follow-up appointment with Dr. Alanda Amass. Dictated by:   Raymon Mutton, P.A. Attending Physician:  Ruta Hinds DD:  04/06/01 TD:   04/09/01 Job: [id number]

## 2010-06-18 NOTE — Op Note (Signed)
[name], [name]                        ACCOUNT NO.:  [id number]   MEDICAL RECORD NO.:  [id number]                   PATIENT TYPE:  INP   LOCATION:  3711                                 FACILITY:  [facility]   PHYSICIAN:  Richard A. Alanda Amass, M.D.          DATE OF BIRTH:  01-Nov-1932   DATE OF PROCEDURE:  10/29/2002  DATE OF DISCHARGE:                                 OPERATIVE REPORT   PROCEDURE:  Retrograde central aortic catheterization, selective coronary  angiography via Judkins technique, LV angiogram RAO and LAO projection,  subselective LIMA and RIMA, saphenous vein graft angiography, abdominal  angiogram midstream PA projection, weight-adjusted heparin, Plavix 300 mg  p.o., Aggrastat double bolus plus infusion, cutting balloon atherectomy high  grade mid/distal LAD stenosis beyond SVG insertion, subsequent tandem DES  Cypher stents.   DESCRIPTION OF PROCEDURE:  The patient was brought to the second floor CP  lab in the postabsorptive state after 5 mg Valium p.o. premedication.  The  right groin was prepped, draped in the usual fashion, 1% Xylocaine was used  for local anesthesia.  CRFA was entered with a single anterior puncture  using an 18 thin wall needle and a 6 French short Daig sidearm sheath were  inserted without difficulty.  Diagnostic coronary angiography was done with  6 French 4 cm tapered Cordis preformed coronary and pigtail catheters using  Omnipaque dye throughout the procedure.  Saphenous vein graft angiography  was done with the right coronary catheter and subselective LIMA and RIMA  were done with the right coronary catheter.  LV angiogram was done with 25  mL at 14 mL per second, 20 mL at 12 mL per second in the RAO and LAO  projection with pullback pressure of the CA obtained.  Abdominal angiogram  was done in the midstream PA projection at 25 mL at 20 mL per second with  visualization to the distal iliacs and SFA profunda junction bilaterally.   With right coronary injection, the patient developed spontaneous VF.  This  was appropriately sensed by his ICD and he was given therapy with internal  cardioversion x1 which was successful in converting the patient to sinus  rhythm. He was semiconscious at the time of his shock and barely aware of  this and suffered no ill effects.  There were no recurrent episodes of VT or  VF during the procedure.   PRESSURES:  1. LV 140/0; LVEDP 16/18 mmHg.  2. CA 140/80 mmHg.   There was no gradient across the aortic valve on catheter pullback.   Fluoroscopy showed 2 to 3+ calcification at the proximal LAD, circumflex,  and right coronary artery.  There was no significant intracardiac or  valvular calcification seen.   LV angiogram showed hypokinesis of the mid anterolateral wall, the mid  inferior wall, and posteroapical and septal apical segments.  Estimated EF  was approximately 35 to 39%. There was no significant mitral regurgitation  present.  The main left coronary was normal.   The LAD was totally occluded proximally at its origin. The proximal  circumflex had 100% occlusion after two small OM's and a left atrial branch.  There was antegrade flow via recanalization to the circumflex just before  the PABG branch. There was an 80 to 90% lesion of the circumflex before the  circumflex proper and OM3 branch.  The circumflex then had irregularities,  but was a large vessel and gave off a PDA and PLA distally and a fourth  marginal branch that was visualized.   The OM3 had 100% occlusion and was likewise recanalized with antegrade  filling and good flow. There was 60% mid and 70 to 80% distal lesions.  The  appearance of the circumflex and marginal branches were essentially  unchanged from the patient's prior last angiogram of March of 2003.   The right coronary artery was totally occluded after a small conus and  moderate size left atrial branch. There was some recanalization and   antegrade filling of a small RV branch.   Saphenous vein graft to the LAD was widely patent and smooth and was an  excellent graft with an excellent anastomosis to the junction of the  proximal mid third beyond the second diagonal branch.  There was retrograde  filling of two moderate size thin, diagonal branches.  One of them had no  significant stenosis and the other one had an 80 to 90% ostial stenosis  which was unchanged, but good flow retrograde. There was also retrograde  filling of the large septal perforator branch that supplied extensive  collaterals to the circumflex system (dual collaterals to the circumflex  system).   The LAD beyond the graft insertion had a 90% eccentric calcific stenosis  that had progressed from the patient's prior angiography.  The LAD coursed  to the apex and undersurface of the heart, gave off several branches, and  had distal collaterals there were somewhat jeopardized by the LAD lesion.   DISCUSSION:  Please refer to H&P and prior office note of October 14, 2002.  The patient is a longterm patient of mine. He is 72 now, divorced  with two children, and one grandchild.  He has an extensive history of  coronary artery disease and underwent CABG x1 in [place], [place], in 1983.  He subsequently had sustained VT after an SEMI and underwent his first ICD  at [facility] in [place] in 1990 in the left upper abdominal  quadrant.  His second ICD was in 1993 in [place] and his third ICD was  implanted by Dr. Laneta Simmers and Dr. Graciela Husbands on September 14, 1995, (Medtronic micro-  joule 7221) with initial anterior and posterior epicardial patches and screw-  in rate sensing leads.  He has a history of polycythemia requiring past  phlebotomies under the care of Samul Dada, M.D.  GERD, upper GI  disease, past cholecystectomy, and sees Dr. Virginia Rochester for GI.  He recently was changed from chronic Lopressor to Coreg and was taking less than the  prescribed dose and  had an ICD discharged for VF on October 12, 2002, (see  tracings).  He had successful ICD shock for this and had no recurrences.  He  was seen in the office on October 14, 2002, and his beta blockers were  increased and he was found to be at [facility] on his ICD.  We had planned to make  plans for elective generator replacement and the case was discussed  with Dr.  Graciela Husbands and he was referred to Dr. [name].  In the interim, an outpatient  Cardiolite was performed which showed new anterior ischemia since his last  Cardiolite.  He was admitted to the hospital on October 28, 2002, with a  prolonged episode of chest pain. Myocardial infarction was ruled out by  serial enzymes and EKG's and catheterization was prompted for this reason.  We wanted to assess his new angina, positive Cardiolite, and recent episode  of VF requiring ICD therapy for possibility of progression of disease.   The patient was last studied on September 05, 2001, and treated medically.  His  progression of his LAD lesion beyond the graft implant and no significant  change in the recanalized and bridging collaterals, there was occluded  circumflex and OM (dominant vessel).  He has some jeopardized collaterals to  his circumflex system as well.   It was felt best to proceed with intervention in this setting.  Consideration was given to the fact that the patient will need ICD  replacement, but it was felt that this would probably be necessary within  the next month and he would need to be on Plavix and aspirin anyway and he  might actually have less risk of SAT (subacute thrombosis) with a DES stent  than a bare mantle stent.  Plavix could be interrupted briefly for surgery  if necessary and it might not need to be since it does not require any  venipuncture, but rather just probable device change and testing.   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   Informed consent was obtained to proceed with this. The patient was started  on double bolus Aggrastat (20 mcg/kg), given 300 mg of Plavix, and continued  on aspirin.  During the procedure, he was given IV Lopressor 5 mg x2 and  intermittent boluses of nitroglycerin.  He was given 5200 units of weight-  adjusted heparin and ACT's were monitored throughout the procedure.   The SVG to the LAD was selectively cannulated with a 6 [place] JR4 Sci-Med  guiding catheter.  The LAD was traversed with a 0.014 inch Sahi saw  steerable guide wire which was positioned beyond the stenosis in the distal  native LAD through the graft.  We initially tried to pass a 2.25 cutting  balloon across the LAD stenosis, but this would not pass easily, so it was  removed and the LAD was dilated with a 2.5/12 Voyager at 5 to 6 atmospheres for 15 to 20 seconds (three inflations).  Because there was still some  resistance there, the balloon was exchanged for a 2.25/10 cutter. This was  positioned across the calcific stenosis and dilated at 4/35 with good  balloon inflation formed.  The balloon was then exchanged for a 2.5/18  Cypher to cover the stenosis and taper the stented area.  It was deployed at  12/25 and post dilated at 16/40.  Because of residual stenosis proximal to  this, an overlapping tandem 3.0/13 Cypher stent was then positioned  overlapping the first one in proximal position, expanded at 14/40 and post  dilated at the overlap at 12/35.  The balloon was pulled back.  IC  nitroglycerin was administered.  The final results showed stenosis reduction  of 90 to 0% with excellent tapered stent appearance and angiographic result  and  good flow.  All of the small distal diagonal branches were intact as  well as the retrograde diagonal branches that fell through the graft.  Collaterals to the dominant circumflex were also intact.   The  patient tolerated the procedure well.  Final ACT was greater than 290  seconds.  Dilatation system was removed.  Sidearm sheath was flushed and  secured to the skin with #1 silk suture to prevent migration. The patient  was transferred to the holding area for postoperative care.  He will also  have ICD interrogation.  He received 300 of Plavix in the laboratory and  will receive another 300 loading dose today and then be continued on aspirin  and Plavix and 2B3A inhibitors for approximately 18 hours.  Postoperatively,  he will be monitored in the CCU.  We will then ask Dr. [name] to see him for  evaluation for ERI generator replacement of his ICD.   CATHETERIZATION DIAGNOSES:  1. Atherosclerotic heart disease, status post coronary artery bypass graft     in [place], [place], in 1983.  2. Recurrent angina, positive Cardiolite, recurrent ventricular tachycardia,     possibly ischemic in September of 2004, prompting catheterization.  3. High grade mid left anterior descending calcific segmental stenosis,     progression of disease, treated with successful cutting balloon     atherectomy and tandem stenting as outlined above.  4. Totally occluded dominant proximal circumflex and large OM3, chronic     recanalization with bidirectional collaterals, no change since last     angiogram of March of 2003.  5. Left ventricular dysfunction, ejection fraction approximately 35 to 38%,     chronic unchanged segmental wall motion abnormalities.  6. Implantable cardioverter defibrillator implant for ventricular     tachycardia following subendocardial myocardial infarction in 1990 in     [place], [place].  7. Second implantable cardioverter defibrillator in 1993 in [place].  8. Third implantable cardioverter defibrillator on September 14, 1995,     Medtronic Micro-Joule 7221, no discharges until September of 2004 -     configuration with epicardial patches and screw-in rate sensing leads    from initial  implant outlined in history.  9. Remote cholecystectomy.  10.      History of polycythemia treated with past phlebotomies.  11.      Possible mild hyperthyroidism, further evaluation pending.  12.      Remote cholecystectomy.  13.      Gastroesophageal reflux disease.  14.      Hyperlipidemia, hypertriglyceridemia.                                               Richard A. Alanda Amass, M.D.    RAW/MEDQ  D:  10/29/2002  T:  10/29/2002  Job:  [id number]   cc:   Nicki Guadalajara, M.D.  [phone] [address], [address]  [place], [place] [address]  Fax: [phone]   Olene Craven, M.D.  [address]  [address]  [place]  [place] [address]  Fax: [phone]   Samul Dada, M.D.  [address]  [place]  [place] [address]  Fax: [phone]   Georgiana Spinner, M.D.  [address]  [place]  [place] [address]  Fax: [phone]   Doylene Canning. [name], M.D.

## 2010-06-18 NOTE — Consult Note (Signed)
[name], [name]                        ACCOUNT NO.:  [id number]   MEDICAL RECORD NO.:  [id number]                   PATIENT TYPE:  INP   LOCATION:  4733                                 FACILITY:  [facility]   PHYSICIAN:  Doylene Canning. [name], M.D.               DATE OF BIRTH:  1932-11-21   DATE OF CONSULTATION:  10/30/2002  DATE OF DISCHARGE:                                   CONSULTATION   REASON FOR CONSULTATION:  Evaluation of patient with a history of ischemic  cardiomyopathy, VT status post abdominal ICD insertion with recent ICD  discharge now at device ERI.   HISTORY OF PRESENT ILLNESS:  The patient is a 77 year old man who sustained  a myocardial infarction in 1990.  At that time had sustained VT and  underwent ICD insertion with an epicardial system placed utilizing an  abdominal generator site.  He had initial device generator change in 1993  and again in 1997.  He has recently had an ICD discharge which demonstrated  sustained monomorphic VT at a cycle length of 250 milliseconds.  The patient  is admitted to the hospital secondary to positive Cardiolite with a  catheterization demonstrating a high grade stenosis of the vein graft to the  right coronary artery.  He underwent successful angioplasty.  He is now  referred for consideration for ICD generator change.  The patient has  otherwise been stable.  He denies shortness of breath at present.   PAST MEDICAL HISTORY:  1. Hypertension.  2. Hyperlipidemia.  3. Non-insulin-dependent diabetes.  4. Dyslipidemia.  5. History of polycythemia.  6. Ejection fraction is 35%.  7. He is status post cholecystectomy.  8. Had had right knee surgery in the past.   SOCIAL HISTORY:  The patient lives alone in [place].  He is retired.  He  has two children.  He denies tobacco or ethanol use.   FAMILY HISTORY:  Notable for mother dying of complications of diabetes and  father dying of complications of coronary disease.   REVIEW  OF SYSTEMS:  Notable for chest pain with exertion.  He denies vision  or hearing problems.  He denies nausea, vomiting, diarrhea, or constipation.  He denies polyuria, polydipsia.  He denies depression, anxiety, weakness, or  numbness.  He denies dysuria, hematuria.  The rest of his review of systems  was negative.   PHYSICAL EXAMINATION:  GENERAL:  He is a pleasant, well-appearing, middle  aged man in no distress.  VITAL SIGNS:  Blood pressure 150/68, pulse 76 and regular, respirations 18,  temperature 97.  HEENT:  Normocephalic, atraumatic.  Pupils equal and round.  The oropharynx  was moist.  The sclerae were anicteric.  NECK:  No jugular venous distention.  There is no thyromegaly.  The trachea  was midline.  CARDIOVASCULAR:  Regular rate and rhythm with normal S1 and S2.  There are  no murmurs, rubs, or gallops.  LUNGS:  Clear bilaterally to auscultation.  There are no wheezes, rales, or  rhonchi.  ABDOMEN:  Soft, nontender, nondistended.  There was a well healed left upper  quadrant abdominal scar with underlying defibrillator.  EXTREMITIES:  No clubbing, cyanosis, edema.  NEUROLOGIC:  He is alert and oriented x3 with cranial nerves II-XII grossly  intact.  Strength 5/5 and symmetric.   LABORATORIES:  EKG demonstrates normal sinus rhythm with prior inferior MI.   IMPRESSION:  1. Ischemic cardiomyopathy.  2. Ventricular tachycardia.  3. Status post ICD insertion with most recent generator change in 1997 now     with device at [facility].   DISCUSSION:  I have discussed the treatment options with the patient.  Proceeding with ICD generator change is indicated as his present device  which is 77 years old is at elective replacement indication.  The risks,  benefits, goals, and expectations of this procedure have been discussed with  patient and he wishes to proceed.                                               Doylene Canning. [name], M.D.    GWT/MEDQ  D:  10/31/2002  T:  10/31/2002   Job:  [id number]   cc:   Olene Craven, M.D.  [address]  [address]  [place]  [place] [address]  Fax: [phone]   Samul Dada, M.D.  [address]  [place]  [place] [address]  Fax: [phone]   Georgiana Spinner, M.D.  [address]  [place]  [place] [address]  Fax: [phone]

## 2010-06-18 NOTE — Op Note (Signed)
   [name], [name]                        ACCOUNT NO.:  [id number]   MEDICAL RECORD NO.:  [id number]                   PATIENT TYPE:  INP   LOCATION:  4733                                 FACILITY:  [facility]   PHYSICIAN:  Doylene Canning. [name], M.D.               DATE OF BIRTH:  September 01, 1932   DATE OF PROCEDURE:  10/31/2002  DATE OF DISCHARGE:                                 OPERATIVE REPORT   NO DICTATION                                               Doylene Canning. [name], M.D.    GWT/MEDQ  D:  10/31/2002  T:  10/31/2002  Job:  [id number]

## 2010-06-18 NOTE — Discharge Summary (Signed)
NAME:  CLAUDIA, ALVIZO                        ACCOUNT NO.:  [id number]   MEDICAL RECORD NO.:  [id number]                   PATIENT TYPE:  INP   LOCATION:  2013                                 FACILITY:  [facility]   PHYSICIAN:  Cristy Hilts. Jacinto Halim, M.D.                  DATE OF BIRTH:  Jul 13, 1932   DATE OF ADMISSION:  05/29/2002  DATE OF DISCHARGE:  05/30/2002                                 DISCHARGE SUMMARY   HISTORY OF PRESENT ILLNESS:  The patient is a 77 year old white male patient  of Dr. Pearletha Furl. [facility] and Dr. Olene Craven and he sees Dr.  Melrose Nakayama. Virginia Rochester for his GI problems, has a history of ASCVD, status post CABG.  His last cardiac catheterization was done March 2003.  It showed a patent  vein graft to his LAD.  He had a high-grade OM, diagonal, 60% distal  circumflex.  He had a total RCA and a recanalized circumflex.  He had an 80%  distal LAD.  He had multiple areas for possible ischemia, however, medical  therapy was recommended and he did undergo EECP treatments; he did not  tolerate these very well.  He also has ischemic cardiomyopathy with an EF of  35%.  He has an AICD placed, the first one in 1990, second in 1993 and the  third placed September 24, 1995 with a Risk manager 7221.  He came  into the ER with complaints of some chest discomfort that he thought was  indigestion, which he does have a lot of problems with reflux.  He had been  off his PPI, however, it was prolonged discomfort that was not relieved by  his usual Maalox.  He then tried to take three nitroglycerin and this did  not relieve him.  He came into the emergency room and he was given a GI  cocktail with some relief.  He was seen by Dr. Kem Boroughs in the ER and  it was decided to keep him overnight and rule him out for an MI and then  discharge him home the following morning if his CK-MBs were negative.   HOSPITAL COURSE:  He was seen by Dr. Cristy Hilts. Ganji on May 30, 2002.  His CK-  MBs  were negative.  His troponins were negative.  Dr. Jacinto Halim decided to  change his atenolol to Coreg and he had been placed on Protonix 40 mg twice  a day on his admission.  His blood pressure on the day of discharge was  120/67, his heart rate was 66, his temperature was 97.1.  His labs showed a  sodium of 138, potassium 3.9 and glucose was 164, BUN 12, creatinine 1.0.  His CK-MBs were negative x3.  His INR was 0.9.  His hemoglobin was 14.7,  hematocrit was 43.1, WBC was 7.6 and platelets were 207,000.  He did have a  guaiac-negative stool that  was checked in the emergency room.  He was not  put on IV heparin on admission.   DISCHARGE MEDICATIONS:  1. Coreg 6.25 mg twice per day.  2. Protonix 40 mg twice per day.  3. Lipitor 20 mg once per day.  4. Lopid 600 mg twice per day.  5. Aspirin 81 mg once per day.  6. Lisinopril and hydrochlorothiazide 10/12.5 mg one per day.  7. Flaxseed oil two tablespoons a day.  8. Metamucil once per day.   SPECIAL DISCHARGE INSTRUCTIONS:  He is to stop taking the Coreg.   ACTIVITY:  Activity is as tolerated.   DIET:  He is to be on a low-saturated-fat diet, low simple carbohydrates and  should eat almonds or walnuts every day.   FOLLOWUP:  He should follow up with Dr. Pearletha Furl. Alanda Amass in  approximately two weeks.   DISCHARGE DIAGNOSES:  1. Chest discomfort/related to reflux.  2. Arteriosclerotic cardiovascular disease, last catheterization, March     2003, with multiple areas of possible ischemia; medical treatment was     recommended.  He did undergo enhanced external counter-pulsation     treatments.  3. Prior history of peptic ulcer disease.  His last     esophagogastroduodenoscopy was one year ago.  4. Cardiomegaly with an ejection fraction of 35%.  5. Automatic implantable cardioverter-defibrillator, third generator change,     September 14, 1995, with a Risk manager 7221.  6. Hyperlipidemia with high triglycerides.  He has been  intolerant to T[place]or     in the past, thus he is on Lopid and Lipitor.  He has also been asked to     take flaxseed oil.  Fish oil was not given to him secondary to his reflux     problem.  7. Chronic back discomfort.  8. Hyperglycemia.  He states he has diet-controlled diabetes.  9. Hypertension, controlled on current medications.  10.      History of small abdominal aortic aneurysm.  11.      Prior history of cholecystectomy and arthroscopy of his right knee.  12.      History of frequent loose stools.     Lezlie Octave, N.P.                        Cristy Hilts. Jacinto Halim, M.D.    BB/MEDQ  D:  05/30/2002  T:  05/30/2002  Job:  [id number]   cc:   Georgiana Spinner, M.D.  [address]  [place]  [place] [address]  Fax: [phone]   Olene Craven, M.D.  [address]  [address]  [place]  [place] [address]  Fax: [phone]   Richard A. Alanda Amass, M.D.  [phone] [address], [address]  [place]  [place] [address]  Fax: [phone]

## 2010-06-18 NOTE — Cardiovascular Report (Signed)
[place]  Patient:    [name], [name] Visit Number: [id number] MRN: [id number]          Service Type: MED Location: 2000 2031 01 Attending Physician:  Ruta Hinds Dictated by:   Lennette Bihari, M.D. Proc. Date: 04/05/01 Admit Date:  04/04/2001   CC:         Richard A. Alanda Amass, M.D.  Orville Govern, R.N.  Alleen Borne, M.D.  Hilario Quarry, M.D.  Samul Dada, M.D.   Cardiac Catheterization  INDICATIONS: The patient is a 77 year old white male, patient of Dr. Alanda Amass, who underwent single-vessel saphenous vein bypass surgery to his LAD in 1983 in [place], [place]. Apparently, in 1990, the patient underwent ICD implantation in [place] and underwent several subsequent revisions in 1993 at [facility] and most recently in 1997 at [facility]. The patient has documented ejection fraction of approximately 35%. Additional problems include hypertension, hyperlipidemia, type 2 diabetes mellitus, and mild obesity. The patient has a history of polycythemia. Recently, the patient has noticed recurrent episodes of some chest discomfort leading to hospitalization yesterday. ECG without acute change. CPK and troponins were negative. She is referred for definitive diagnostic catheterization.  DESCRIPTION OF PROCEDURE: After premedication with Valium intravenously, the patient was prepped and draped in the usual fashion. His right femoral artery was punctured anteriorly and a 6 French sheath was inserted. Diagnostic catheterization was done utilizing 6 French Judkins 4 left and right coronary catheters. A 6 French pigtail catheter was used for biplane left ventriculography, as well as distal aortography. The right catheter was also used for selective angiography into the saphenous vein graft and also into the unbypassed left subclavian internal mammary artery system.  HEMODYNAMIC DATA: Central aortic pressure 126/68.  Left ventricular pressure 126/24.  ANGIOGRAPHIC DATA: Left main coronary artery was angiographically normal.  The LAD was totally occluded at its origin.  The circumflex vessel had 40% smooth ostial narrowing. The vessel gave rise two very small proximal branches and then was totally occluded proximally. There were bridging collaterals supplying the circumflex beyond the total occlusion with a several millimeter occluded segment. The first marginal vessel was also totally occluded proximally and bridging collaterals were noted from one of the small proximal circumflex branches to this marginal vessel. The marginal vessel had diffuse narrowing of 80-90% in its mid segmenT and 90% diffusely distally. The AV groove circumflex gave rise to two additional distal marginal vessels and had 60% narrowing distally.  The right coronary artery was totally occluded proximally. Again, bridging collaterals were noted to the mid RCA. The distal RCA was small caliber and collaterals supplies this from the mid segment.  The saphenous vein graft supplying the LAD was widely patent and free of disease. This anastomosed into the mid LAD extending proximally. The LAD seemed to extend up to the proximal total occlusion. There was 80% smooth narrowing at its most proximal segment just prior to a moderate sized septal perforating artery. The vessel supplied two diagonal vessels prior to the mid LAD anastomosis site. The distal apical LAD has focal smooth 80% narrowing. The LAD wrapped around the ______ of the apex.  The left subclavian and internal mammary artery were normal and unbypassed.  Biplane cinearteriography revealed moderately severe LV dysfunction. Calculated ejection fraction is 34.9% (35%). There was marked hypo to akinesis involving the entire inferior wall on the RAO projection with hypocontractility in the distal anterolateral segment. On the LAD projection the septal contractility was  normal.  There was akinesis involving the inferoapical low posterolateral to mid posterolateral wall.  DISTAL AORTOGRAPHY: Distal aortography did not show any significant renal artery stenosis. There was mild irregularity of the infrarenal aorta with mild aneurysmal dilatation proximal to the iliac bifurcation with mild irregularity of the iliac arteries.  IMPRESSION: 1. Ischemic cardiomyopathy (ejection fraction 35%) with marked hypo to    akinesis involving the entire inferior wall, akinesis involving the    inferoapical low posterolateral to mid posterolateral wall and    hypocontractility in the distal anterolateral segment. 2. Severe native coronary obstructive disease with total ostial occlusion    of the left anterior descending, 40% proximal circumflex stenosis with    total occlusion of the proximal circumflex with bridging collaterals to the    mid circumflex and total occlusion of the obtuse marginal #1 vessel with    bridging collaterals to a moderate sized obtuse marginal #1 vessel with    diffuse stenosis throughout the obtuse marginal #1 vessel and distal    60% circumflex stenosis, total occlusion of the proximal and mid    right coronary artery with bridging collaterals supplying both segments    in a small nondominant right coronary artery system. 71. Patent 75 year old saphenous vein graft supplying the mid    left anterior descending with distal 80% left anterior descending, near    apical stenosis. 4. Patent bypass left internal mammary artery. 5. Mild aortoiliac disease with mild aneurysmal dilatation, not felt to be    significant proximal to the iliac bifurcation.  RECOMMENDATIONS: The patient has ischemic cardiomyopathy. He is status post  bypass surgery 20 years ago and has a prior myocardial infarction and is status post three defibrillator implantations. The vein graft to the LAD is widely patent. Although there is distal to apical LAD disease of  approximately 80%, again the graft is 75 years old and this is smooth and at this point medical therapy of the distal lesion is favorable. The patient has significant additional regions of potential ischemia infarction with total occlusion of the circumflex and right coronary artery with bridging collaterals. Increased medical therapy will be recommended. The patient may be a candidate for ECP therapy. Dictated by:   Lennette Bihari, M.D. Attending Physician:  Ruta Hinds DD:  04/05/01 TD:  04/05/01 Job: [id number]

## 2010-06-18 NOTE — Discharge Summary (Signed)
   NAMELAVONTAE, [name]                        ACCOUNT NO.:  [id number]   MEDICAL RECORD NO.:  [id number]                   PATIENT TYPE:  INP   LOCATION:  4733                                 FACILITY:  [facility]   PHYSICIAN:  Richard A. Alanda Amass, M.D.          DATE OF BIRTH:  10/24/1932   DATE OF ADMISSION:  10/28/2002  DATE OF DISCHARGE:  11/01/2002                                 DISCHARGE SUMMARY   ADDENDUM:  Job #[id number]       Abelino Derrick, P.A.                      Richard A. Alanda Amass, M.D.    [name]  D:  11/01/2002  T:  11/01/2002  Job:  [id number]   cc:   Alfonse Alpers. Dagoberto Ligas, M.D.  [address]  [place]  [place] [address]  Fax: [phone]

## 2010-06-18 NOTE — Op Note (Signed)
[facility]  Patient:    [name], [name] Visit Number: [id number] MRN: [id number]          Service Type: END Location: ENDO Attending Physician:  Sabino Gasser Dictated by:   Sabino Gasser, M.D. Proc. Date: 02/08/01 Admit Date:  02/08/2001                             Operative Report  PROCEDURE:  Upper endoscopy.  INDICATION:  Gastroesophageal reflux disease.  ANESTHESIA:  Demerol 90 mg, Versed 8 mg.  DESCRIPTION OF PROCEDURE:  With the patient mildly sedated in the left lateral decubitus position, the Olympus videoscopic endoscope was inserted in the mouth and passed under direct vision through the esophagus, which appeared normal. We carefully studied the distal esophagus and there was no evidence of Barretts that I could see. We entered into the stomach through a hiatal hernia. Fundus, body, antrum, duodenal bulb, and second portion of the duodenum all appeared normal. From this point, the endoscope was slowly withdrawn taking circumferential views of the entire duodenal mucosa until the endoscope pulled back into the stomach, placed in retroflexion to view the stomach from below. The endoscope was then straightened and withdrawn taking circumferential views of the remaining gastric and esophageal mucosa. The patients vital signs and pulse oximeter remained stable. The patient tolerated the procedure well without apparent complications.  FINDINGS:  Small hiatal hernia; otherwise unremarkable examination.  PLAN:   Have patient follow up with Dr. Virginia Rochester as needed. Dictated by:   Sabino Gasser, M.D. Attending Physician:  Sabino Gasser DD:  02/08/01 TD:  02/08/01 Job: [id number]

## 2010-06-18 NOTE — Op Note (Signed)
   NAME:  [name], [name]                        ACCOUNT NO.:  [id number]   MEDICAL RECORD NO.:  [id number]                   PATIENT TYPE:  AMB   LOCATION:  ENDO                                 FACILITY:  [facility]   PHYSICIAN:  Georgiana Spinner, M.D.                 DATE OF BIRTH:  [birth year]   DATE OF PROCEDURE:  08/14/2002  DATE OF DISCHARGE:                                 OPERATIVE REPORT   PROCEDURE:  Colonoscopy with biopsy.   INDICATIONS:  Colon polyps.   ANESTHESIA:  Demerol 20 mg, Versed 1.   DESCRIPTION OF PROCEDURE:  With the patient mildly sedated and in the left  lateral decubitus position, the Olympus videoscopic colonoscope was inserted  in the rectum and passed under direct vision to the cecum, identified by  ileocecal valve and the appendiceal orifice both of which were photographed.  From this point the colonoscope was slowly withdrawn, taken circumferential  views of the entire colonic mucosa stopping only in the descending colon  where a polyp was seen and photographed and removed using cold biopsy  forceps technique.  When we had removed all of the polypoid tissue, the  endoscope was then further withdrawn, taking circumferential views of the  remaining colonic mucosa stopping only in the rectum which appeared on  direct, showed hemorrhoids on retroflexed view.  The endoscope was  straightened and withdrawn.  The patient's vital signs and pulse oximetry  remained stable.  The patient tolerated the procedure well without apparent  complications.   FINDINGS:  Polyp of the descending colon and internal hemorrhoids; otherwise  unremarkable.   PLAN:  Await biopsy reports.  The patient will call me for results and  follow up with me as an outpatient.                                               Georgiana Spinner, M.D.    GMO/MEDQ  D:  08/14/2002  T:  08/14/2002  Job:  [id number]

## 2010-06-21 ENCOUNTER — Encounter: Payer: Medicare Other | Admitting: *Deleted

## 2010-06-24 ENCOUNTER — Encounter: Payer: Self-pay | Admitting: Cardiology

## 2010-06-25 ENCOUNTER — Ambulatory Visit (INDEPENDENT_AMBULATORY_CARE_PROVIDER_SITE_OTHER): Payer: Medicare Other | Admitting: *Deleted

## 2010-06-25 ENCOUNTER — Ambulatory Visit (INDEPENDENT_AMBULATORY_CARE_PROVIDER_SITE_OTHER): Payer: Medicare Other | Admitting: Cardiology

## 2010-06-25 ENCOUNTER — Encounter: Payer: Self-pay | Admitting: Cardiology

## 2010-06-25 DIAGNOSIS — I2510 Atherosclerotic heart disease of native coronary artery without angina pectoris: Secondary | ICD-10-CM

## 2010-06-25 DIAGNOSIS — I25119 Atherosclerotic heart disease of native coronary artery with unspecified angina pectoris: Secondary | ICD-10-CM | POA: Insufficient documentation

## 2010-06-25 DIAGNOSIS — I5022 Chronic systolic (congestive) heart failure: Secondary | ICD-10-CM

## 2010-06-25 DIAGNOSIS — I4891 Unspecified atrial fibrillation: Secondary | ICD-10-CM

## 2010-06-25 DIAGNOSIS — I472 Ventricular tachycardia, unspecified: Secondary | ICD-10-CM

## 2010-06-25 LAB — POCT INR: INR: 2.2

## 2010-06-25 NOTE — Assessment & Plan Note (Signed)
Patient appears to be well compensated on his exam today. He is on appropriate therapy with carvedilol, diuretics, and lisinopril. We will continue with his current medications and check the results of his recent lab work.

## 2010-06-25 NOTE — Assessment & Plan Note (Signed)
He has a history of paroxysmal atrial fibrillation. He is therapeutic on his Coumadin. His rate is normal today.

## 2010-06-25 NOTE — Patient Instructions (Signed)
Continue your current medications.  We will check your coumadin again in 4 weeks.  I will see you back in 4 months.

## 2010-06-25 NOTE — Assessment & Plan Note (Signed)
He is on chronic amiodarone. He has a ICD in place that is followed by Dr. [name]. He has had no significant defibrillator discharges. We will follow up on his lab work to make sure that his thyroid and liver function studies have been assessed.

## 2010-06-25 NOTE — Progress Notes (Signed)
[name] Date of Birth: Nov 23, 1932   History of Present Illness: [name] is seen for followup today. He is actually doing quite well from a cardiac standpoint. He denies any significant chest pain, shortness of breath, edema, or palpitations. He reports that he had a CT of his head 2 months ago for headache and apparently this was unremarkable. He reports he had lab work 2 weeks ago with [name]  Current Outpatient Prescriptions on File Prior to Visit  Medication Sig Dispense Refill  . amiodarone (PACERONE) 200 MG tablet Take 200 mg by mouth daily. 1.5 DAILY       . aspirin 81 MG tablet Take 81 mg by mouth daily. 2 DAILY       . carvedilol (COREG) 25 MG tablet Take 25 mg by mouth 2 (two) times daily with a meal. 12.5MG  IN THE AM, AND 25MG  IN THE PM       . fluticasone (FLONASE) 50 MCG/ACT nasal spray 2 sprays by Nasal route daily.        . furosemide (LASIX) 40 MG tablet Take 40 mg by mouth 2 (two) times daily.       Marland Kitchen HYDROcodone-acetaminophen (VICODIN) 5-500 MG per tablet Take 1 tablet by mouth every 6 (six) hours as needed.        Marland Kitchen lisinopril (PRINIVIL,ZESTRIL) 10 MG tablet Take 10 mg by mouth daily.        Marland Kitchen loratadine (CLARITIN) 10 MG tablet Take 10 mg by mouth daily.        Marland Kitchen omega-3 acid ethyl esters (LOVAZA) 1 G capsule Take 2 g by mouth 2 (two) times daily.        . pantoprazole (PROTONIX) 40 MG tablet Take 40 mg by mouth 2 (two) times daily.        [name] Glycol-Propyl Glycol (SYSTANE OP) Apply to eye as needed.        . potassium chloride (K-DUR) 10 MEQ tablet Take 10 mEq by mouth daily.        . rosuvastatin (CRESTOR) 5 MG tablet Take 5 mg by mouth once a week.        . warfarin (COUMADIN) 5 MG tablet Take 5 mg by mouth as directed.          Allergies  Allergen Reactions  . Digoxin   . Multaq (Dronedarone Hydrochloride)     Past Medical History  Diagnosis Date  . Headache   . Dizziness   . SOB (shortness of breath)   . Indigestion   .  Difficulty walking   . Chronic back pain   . Atrial fibrillation   . Ischemic cardiomyopathy     WITH CHF  . CHF (congestive heart failure)     EF 35-40%  . Coronary artery disease   . VT (ventricular tachycardia)   . Hypertension   . Dyslipidemia   . Erythrocytosis     Past Surgical History  Procedure Date  . Cardiac defibrillator placement   . Coronary artery bypass graft 1983    History  Smoking status  . Former Smoker  . Quit date: 06/23/1976  Smokeless tobacco  . Not on file    History  Alcohol Use No    Family History  Problem Relation Age of Onset  . Heart disease    . Hypertension    . Stroke    . Diabetes      Review of Systems: The review of systems is positive for increased symptoms  of reflux. He states that his allergies have been worse. He complains of chronic arthritis and arthralgias..  All other systems were reviewed and are negative.  Physical Exam: BP 120/60  Pulse 60  Ht 6' (1.829 m)  Wt 213 lb 8 oz (96.843 kg)  BMI 28.96 kg/m2 He is a very pleasant elderly white male in no acute distress. He is walking with a walker. His HEENT exam is unremarkable. He has no JVD or bruits. Lungs are clear. Cardiac exam reveals a grade 2/6 holosystolic murmur at the apex. There is no S3. Abdomen is soft and nontender. He has no edema. LABORATORY DATA: INR today is 2.2.  Assessment / Plan:

## 2010-06-25 NOTE — Assessment & Plan Note (Signed)
He is asymptomatic. His activity is fairly limited. We will continue on his current medical therapy.

## 2010-06-27 ENCOUNTER — Inpatient Hospital Stay (HOSPITAL_COMMUNITY)
Admission: EM | Admit: 2010-06-27 | Discharge: 2010-06-30 | DRG: 291 | Disposition: A | Discharge status: Home Health | Payer: Medicare Other | Attending: Cardiology | Admitting: Cardiology

## 2010-06-27 ENCOUNTER — Emergency Department (HOSPITAL_COMMUNITY): Payer: Medicare Other

## 2010-06-27 DIAGNOSIS — Q255 Atresia of pulmonary artery: Secondary | ICD-10-CM

## 2010-06-27 DIAGNOSIS — Z8249 Family history of ischemic heart disease and other diseases of the circulatory system: Secondary | ICD-10-CM

## 2010-06-27 DIAGNOSIS — Z95810 Presence of automatic (implantable) cardiac defibrillator: Secondary | ICD-10-CM

## 2010-06-27 DIAGNOSIS — I2510 Atherosclerotic heart disease of native coronary artery without angina pectoris: Secondary | ICD-10-CM | POA: Diagnosis present

## 2010-06-27 DIAGNOSIS — E785 Hyperlipidemia, unspecified: Secondary | ICD-10-CM | POA: Diagnosis present

## 2010-06-27 DIAGNOSIS — K219 Gastro-esophageal reflux disease without esophagitis: Secondary | ICD-10-CM | POA: Diagnosis present

## 2010-06-27 DIAGNOSIS — I5023 Acute on chronic systolic (congestive) heart failure: Principal | ICD-10-CM | POA: Diagnosis present

## 2010-06-27 DIAGNOSIS — Z7901 Long term (current) use of anticoagulants: Secondary | ICD-10-CM

## 2010-06-27 DIAGNOSIS — I10 Essential (primary) hypertension: Secondary | ICD-10-CM | POA: Diagnosis present

## 2010-06-27 DIAGNOSIS — E119 Type 2 diabetes mellitus without complications: Secondary | ICD-10-CM | POA: Diagnosis present

## 2010-06-27 DIAGNOSIS — E669 Obesity, unspecified: Secondary | ICD-10-CM | POA: Diagnosis present

## 2010-06-27 DIAGNOSIS — Z9861 Coronary angioplasty status: Secondary | ICD-10-CM

## 2010-06-27 DIAGNOSIS — E781 Pure hyperglyceridemia: Secondary | ICD-10-CM | POA: Diagnosis present

## 2010-06-27 DIAGNOSIS — R0602 Shortness of breath: Secondary | ICD-10-CM

## 2010-06-27 DIAGNOSIS — Z7982 Long term (current) use of aspirin: Secondary | ICD-10-CM

## 2010-06-27 DIAGNOSIS — Z951 Presence of aortocoronary bypass graft: Secondary | ICD-10-CM

## 2010-06-27 DIAGNOSIS — I4891 Unspecified atrial fibrillation: Secondary | ICD-10-CM | POA: Diagnosis present

## 2010-06-27 DIAGNOSIS — I2589 Other forms of chronic ischemic heart disease: Secondary | ICD-10-CM | POA: Diagnosis present

## 2010-06-27 DIAGNOSIS — Q2571 Coarctation of pulmonary artery: Secondary | ICD-10-CM

## 2010-06-27 DIAGNOSIS — D509 Iron deficiency anemia, unspecified: Secondary | ICD-10-CM | POA: Diagnosis present

## 2010-06-27 DIAGNOSIS — I509 Heart failure, unspecified: Secondary | ICD-10-CM | POA: Diagnosis present

## 2010-06-27 LAB — COMPREHENSIVE METABOLIC PANEL
BUN: 28 mg/dL — ABNORMAL HIGH (ref 6–23)
CO2: 27 mEq/L (ref 19–32)
Calcium: 9.2 mg/dL (ref 8.4–10.5)
Creatinine, Ser: 1.32 mg/dL (ref 0.4–1.5)
GFR calc non Af Amer: 53 mL/min — ABNORMAL LOW (ref >60)
Glucose, Bld: 135 mg/dL — ABNORMAL HIGH (ref 70–99)
Total Bilirubin: 0.8 mg/dL (ref 0.3–1.2)

## 2010-06-27 LAB — CBC
HCT: 34 % — ABNORMAL LOW (ref 39.0–52.0)
MCHC: 29.7 g/dL — ABNORMAL LOW (ref 30.0–36.0)
Platelets: 225 10*3/uL (ref 150–400)
RDW: 17.1 % — ABNORMAL HIGH (ref 11.5–15.5)
WBC: 12.9 10*3/uL — ABNORMAL HIGH (ref 4.0–10.5)

## 2010-06-27 LAB — DIFFERENTIAL
Basophils Absolute: 0 10*3/uL (ref 0.0–0.1)
Basophils Relative: 0 % (ref 0–1)
Eosinophils Absolute: 0.2 10*3/uL (ref 0.0–0.7)
Eosinophils Relative: 1 % (ref 0–5)
Lymphocytes Relative: 10 % — ABNORMAL LOW (ref 12–46)
Monocytes Absolute: 3.3 10*3/uL — ABNORMAL HIGH (ref 0.1–1.0)

## 2010-06-27 LAB — CK TOTAL AND CKMB (NOT AT ARMC)
CK, MB: 2.6 ng/mL (ref 0.3–4.0)
Relative Index: INVALID (ref 0.0–2.5)
Total CK: 78 U/L (ref 7–232)

## 2010-06-27 LAB — APTT: aPTT: 42 seconds — ABNORMAL HIGH (ref 24–37)

## 2010-06-27 LAB — CARDIAC PANEL(CRET KIN+CKTOT+MB+TROPI)
CK, MB: 2.5 ng/mL (ref 0.3–4.0)
Total CK: 91 U/L (ref 7–232)
Troponin I: <0.3 ng/mL (ref <0.30)

## 2010-06-27 LAB — PROTIME-INR: INR: 2.06 — ABNORMAL HIGH (ref 0.00–1.49)

## 2010-06-28 DIAGNOSIS — I5023 Acute on chronic systolic (congestive) heart failure: Secondary | ICD-10-CM

## 2010-06-28 LAB — PROTIME-INR: INR: 1.93 — ABNORMAL HIGH (ref 0.00–1.49)

## 2010-06-28 LAB — FOLATE: Folate: >20 ng/mL

## 2010-06-28 LAB — FERRITIN: Ferritin: 31 ng/mL (ref 22–322)

## 2010-06-28 LAB — BASIC METABOLIC PANEL
CO2: 30 mEq/L (ref 19–32)
Calcium: 9.2 mg/dL (ref 8.4–10.5)
GFR calc Af Amer: >60 mL/min (ref >60)
GFR calc non Af Amer: 50 mL/min — ABNORMAL LOW (ref >60)
Glucose, Bld: 121 mg/dL — ABNORMAL HIGH (ref 70–99)
Potassium: 4.3 mEq/L (ref 3.5–5.1)
Sodium: 140 mEq/L (ref 135–145)

## 2010-06-28 LAB — CBC
HCT: 33.6 % — ABNORMAL LOW (ref 39.0–52.0)
Hemoglobin: 9.9 g/dL — ABNORMAL LOW (ref 13.0–17.0)
MCH: 22.1 pg — ABNORMAL LOW (ref 26.0–34.0)
MCHC: 29.5 g/dL — ABNORMAL LOW (ref 30.0–36.0)
RBC: 4.48 MIL/uL (ref 4.22–5.81)

## 2010-06-28 LAB — IRON AND TIBC
Iron: 24 ug/dL — ABNORMAL LOW (ref 42–135)
UIBC: 389 ug/dL

## 2010-06-29 ENCOUNTER — Telehealth: Payer: Self-pay | Admitting: Cardiology

## 2010-06-29 DIAGNOSIS — I509 Heart failure, unspecified: Secondary | ICD-10-CM

## 2010-06-29 LAB — BASIC METABOLIC PANEL
BUN: 23 mg/dL (ref 6–23)
CO2: 32 mEq/L (ref 19–32)
Chloride: 101 mEq/L (ref 96–112)
Creatinine, Ser: 1.2 mg/dL (ref 0.4–1.5)
Glucose, Bld: 123 mg/dL — ABNORMAL HIGH (ref 70–99)
Potassium: 3.9 mEq/L (ref 3.5–5.1)

## 2010-06-29 LAB — PROTIME-INR: Prothrombin Time: 20.9 seconds — ABNORMAL HIGH (ref 11.6–15.2)

## 2010-06-29 NOTE — Telephone Encounter (Signed)
FaxLatest Coumadin regimine

## 2010-06-30 ENCOUNTER — Other Ambulatory Visit: Payer: Self-pay | Admitting: *Deleted

## 2010-06-30 LAB — BASIC METABOLIC PANEL
CO2: 31 mEq/L (ref 19–32)
Chloride: 101 mEq/L (ref 96–112)
Creatinine, Ser: 1.22 mg/dL (ref 0.4–1.5)
GFR calc Af Amer: >60 mL/min (ref >60)
Potassium: 3.6 mEq/L (ref 3.5–5.1)
Sodium: 139 mEq/L (ref 135–145)

## 2010-06-30 LAB — PROTIME-INR: INR: 2.13 — ABNORMAL HIGH (ref 0.00–1.49)

## 2010-06-30 MED ORDER — AMIODARONE HCL 200 MG PO TABS: ORAL_TABLET | ORAL | Status: DC

## 2010-06-30 NOTE — Telephone Encounter (Signed)
escribe medication per fax request  

## 2010-07-01 ENCOUNTER — Ambulatory Visit (INDEPENDENT_AMBULATORY_CARE_PROVIDER_SITE_OTHER): Payer: Medicare Other | Admitting: *Deleted

## 2010-07-01 DIAGNOSIS — I428 Other cardiomyopathies: Secondary | ICD-10-CM

## 2010-07-01 NOTE — H&P (Signed)
[name], Joshua NO.:  [id number]  MEDICAL RECORD NO.:  [id number]           [name] TYPE:  I  LOCATION:  3714                         FACILITY:  [facility]  PHYSICIAN:  Cassell Clement, M.D. DATE OF BIRTH:  1932/12/02  DATE OF ADMISSION:  06/27/2010 DATE OF DISCHARGE:                             HISTORY & PHYSICAL   CARDIOLOGIST:  [name], MD.  MEDICAL DOCTOR:  [name], MD  CHIEF COMPLAINT:  Shortness of breath.  HISTORY OF PRESENT ILLNESS:  This is a 77 year old gentleman, who presents to the emergency room with a 2-day history of increasing shortness of breath.  He has a known past history of chronic systolic congestive heart failure.  He has not been experiencing any chest pain. He saw Dr. Swaziland in the office two days ago and at that time, his symptoms were stable.  Last night, he had severe dyspnea and came to the emergency room this morning.  His past history reveals that he has a history of acute on chronic systolic heart failure secondary to ischemic cardiomyopathy.  His last echocardiogram was May 14, 2009, which showed an ejection fraction of 35-40% with akinesis of entire inferior myocardium.  He has a history of ICD insertion for previous monomorphic ventricular tachycardia with an ejection fraction of 35%.  His procedure was by Dr. Lewayne Bunting on October 31, 2002 with insertion of a new implantable cardioverter defibrillator system with defibrillation testing at that time.  He has a Passenger transport manager.  The [name] has had no recent shocks from his defibrillator.  He has not been aware of any recent arrhythmias or palpitations.  He denies any chest pain.  He has a history of known coronary artery disease.  He had coronary artery bypass graft surgery with saphenous vein graft to the LAD in 1983.  He had cardiac catheterization in September 2008, which revealed widely patent tandem drug-eluting  stent in the LAD just beyond the saphenous vein graft.  The [name] also has a history of hypertension, hyperlipidemia, gastroesophageal reflux disease, and is status post cholecystectomy.  As noted, the [name] was seen 2 days ago by Dr. Swaziland in the office and at that time, he seemed to be stable.  However, last evening, his dyspnea got worse.  He denies any dietary indiscretion or excessive salt intake.  In the emergency room, his chest x-ray shows small bilateral pleural effusions, but no congestive heart failure.  He was given some Lasix in the emergency room with modest diuresis, but he felt he was too short of breath to be discharged home.  His cardiac enzymes in the emergency room are negative on the initial study.  He denies chest pain, but has had chronic symptoms of GERD.  MEDICATIONS:  His home medications are as follows, 1. Crestor 5 mg once a week. 2. Carvedilol 25 mg taking half tablet in the morning and whole tablet     in the evening. 3. Lisinopril 10 mg twice a day. 4. Furosemide 20 mg two each morning. 5. Klor-Con 10 mEq taking half tablet daily. 6. Warfarin 1 tablet on Sundays  and half a tablet other 6 days. 7. Pacerone 200 mg taking a full tablet in the morning and half tablet     in the evening. 8. Lovaza 1 tablet twice a day. 9. Aspirin 81 mg daily. 10.Protonix one twice a day. 11.Generic Vicodin 5/325 one every 6 hours p.r.n. for chronic back. 12.Dulera inhaler 2 puffs twice a day and he also takes over-the-     counter allergy medicine in the form of loratadine. 13.He also uses Restasis drops p.r.n. for dry eyes.  ALLERGIES:  The [name] has no known drug allergies.  SOCIAL HISTORY:  Reveals that he does not use alcohol or tobacco.  He quit smoking many years ago.  The [name] is retired.  He used to be a Medical illustrator for [employer].  He is single and lives alone.  FAMILY HISTORY:  Significant for coronary artery disease.  PAST SURGICAL HISTORY:   Significant for bypass graft surgery, cholecystectomy, and ICD implant with reimplant in 2004.  REVIEW OF SYSTEMS:  The [name] denies any fever, chills, purulent sputum, or purulent cough.  He is not having any genitourinary symptoms. He has had no change in bowel habits.  He does have chronic symptoms of GERD.  All other systems negative in detail.  PHYSICAL EXAMINATION:  VITAL SIGNS:  His blood pressure is 139/60, pulse is 58, O2 sat 100% on 2 L. GENERAL:  General appearance reveals a well-developed and well-nourished gentleman, who is minimally dyspneic and is in no distress. HEENT:  Head and neck exam, pupils equal and reactive.  Extraocular movements are full.  Sclerae are clear.  Mouth and pharynx reveals that he has edentulous.  He has plates at home, which did not fit. NECK:  The jugular venous pressure is not elevated.  There are no carotid bruits.  The thyroid is not enlarged or tender.  There is no lymphadenopathy. CHEST:  Reveals slight decreased breath sounds at the right base.  There is no rales or wheezing. HEART:  Reveals a regular rhythm without murmur, gallop, rub, or click. ABDOMEN:  Soft and nontender. EXTREMITIES:  Showed no phlebitis or edema.  Pedal pulses are present. MUSCULOSKELETAL:  Reveals no effusions or joint deformity or tenderness. NEUROLOGIC:  Physiologic. INTEGUMENT:  Unremarkable.  LABORATORY DATA:  Chest x-ray shows borderline cardiomegaly with small bilateral pleural effusions, but no acute CHF.  EKG shows normal sinus rhythm, minor nonspecific T-wave changes.  No ischemic changes.  Lab work includes white count 12,900, hemoglobin 10.1, hematocrit 34, platelets 225,000.  Sodium 138, potassium 4.3, BUN 28, creatinine 1.32. Liver function studies are normal.  Initial cardiac markers include normal CK-MB of 2.6.  Normal troponin of less than 0.30.  His B- natriuretic peptide is slightly elevated at 594.  His INR is therapeutic at  2.06.  IMPRESSION: 1. Acute on chronic systolic congestive heart failure with past     history of left ventricular ejection fraction 35-40% with past     history of akinesis of the entire inferior wall myocardium by     echocardiogram, May 14, 2009. 2. Status post implantation of [employer] single chamber defibrillator,     October 31, 2002, by Dr. Lewayne Bunting.  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 3. Past history of atrial fibrillation, maintaining normal sinus     rhythm on Pacerone. 4. History of coronary artery disease status post CABG in 1983 with     last cath in 2008, not requiring PCI.  No recent angina.  5. Remote history of monomorphic ventricular tachycardia. 6. Essential hypertension. 7. Hyperlipidemia. 8. Obesity. 9. Gastroesophageal reflux disease.  DISPOSITION:  We are admitting the [name] for IV Lasix diuresis.  We will plan to get a updated two-dimensional echocardiogram during this admission.          ______________________________ Cassell Clement, M.D.     TB/MEDQ  D:  06/27/2010  T:  06/27/2010  Job:  [id number]  Electronically Signed by Cassell Clement M.D. on 07/01/2010 12:48:31 PM

## 2010-07-02 ENCOUNTER — Encounter: Payer: Self-pay | Admitting: Internal Medicine

## 2010-07-02 ENCOUNTER — Other Ambulatory Visit: Payer: Self-pay | Admitting: *Deleted

## 2010-07-02 DIAGNOSIS — Z79899 Other long term (current) drug therapy: Secondary | ICD-10-CM

## 2010-07-05 ENCOUNTER — Other Ambulatory Visit (INDEPENDENT_AMBULATORY_CARE_PROVIDER_SITE_OTHER): Payer: Medicare Other | Admitting: *Deleted

## 2010-07-05 ENCOUNTER — Ambulatory Visit (INDEPENDENT_AMBULATORY_CARE_PROVIDER_SITE_OTHER): Payer: Medicare Other | Admitting: *Deleted

## 2010-07-05 DIAGNOSIS — I4891 Unspecified atrial fibrillation: Secondary | ICD-10-CM

## 2010-07-05 DIAGNOSIS — Z79899 Other long term (current) drug therapy: Secondary | ICD-10-CM

## 2010-07-05 LAB — BASIC METABOLIC PANEL
BUN: 34 mg/dL — ABNORMAL HIGH (ref 6–23)
CO2: 28 mEq/L (ref 19–32)
Chloride: 102 mEq/L (ref 96–112)
GFR: 50.37 mL/min — ABNORMAL LOW (ref >60.00)
Glucose, Bld: 108 mg/dL — ABNORMAL HIGH (ref 70–99)
Potassium: 4.1 mEq/L (ref 3.5–5.1)
Sodium: 139 mEq/L (ref 135–145)

## 2010-07-07 NOTE — Progress Notes (Signed)
ICD REMOTE  

## 2010-07-08 ENCOUNTER — Telehealth: Payer: Self-pay | Admitting: Cardiology

## 2010-07-08 NOTE — Telephone Encounter (Signed)
Notified of lab results. Also states he had "dark stool" this AM. Has been taking iron since d/c from hospital and stool has been dark for past few days.  Advised to hold taking iron for couple of days and if doesn't clear up can call us back.

## 2010-07-08 NOTE — Telephone Encounter (Signed)
Message copied by Lorayne Bender on Thu Jul 08, 2010 10:58 AM ------      Message from: Swaziland, PETER M      Created: Wed Jul 07, 2010  5:19 PM       Renal function stable. Mild increase in creatnine due to higher diuretic dose. Continue same.

## 2010-07-08 NOTE — Telephone Encounter (Signed)
Blood being low- iron pills and has concerns

## 2010-07-14 ENCOUNTER — Encounter: Payer: Self-pay | Admitting: Nurse Practitioner

## 2010-07-14 ENCOUNTER — Telehealth: Payer: Self-pay | Admitting: Cardiology

## 2010-07-14 ENCOUNTER — Ambulatory Visit (INDEPENDENT_AMBULATORY_CARE_PROVIDER_SITE_OTHER): Payer: Medicare Other | Admitting: *Deleted

## 2010-07-14 ENCOUNTER — Ambulatory Visit (INDEPENDENT_AMBULATORY_CARE_PROVIDER_SITE_OTHER): Payer: Medicare Other | Admitting: Nurse Practitioner

## 2010-07-14 DIAGNOSIS — I2510 Atherosclerotic heart disease of native coronary artery without angina pectoris: Secondary | ICD-10-CM

## 2010-07-14 DIAGNOSIS — I4891 Unspecified atrial fibrillation: Secondary | ICD-10-CM

## 2010-07-14 DIAGNOSIS — R0989 Other specified symptoms and signs involving the circulatory and respiratory systems: Secondary | ICD-10-CM

## 2010-07-14 DIAGNOSIS — R0609 Other forms of dyspnea: Secondary | ICD-10-CM

## 2010-07-14 DIAGNOSIS — R0600 Dyspnea, unspecified: Secondary | ICD-10-CM

## 2010-07-14 DIAGNOSIS — I5022 Chronic systolic (congestive) heart failure: Secondary | ICD-10-CM

## 2010-07-14 DIAGNOSIS — I5020 Unspecified systolic (congestive) heart failure: Secondary | ICD-10-CM

## 2010-07-14 DIAGNOSIS — I472 Ventricular tachycardia: Secondary | ICD-10-CM

## 2010-07-14 LAB — BASIC METABOLIC PANEL
BUN: 28 mg/dL — ABNORMAL HIGH (ref 6–23)
CO2: 31 mEq/L (ref 19–32)
Calcium: 9.3 mg/dL (ref 8.4–10.5)
Chloride: 105 mEq/L (ref 96–112)
Creatinine, Ser: 1.6 mg/dL — ABNORMAL HIGH (ref 0.4–1.5)
GFR: 45.95 mL/min — ABNORMAL LOW (ref >60.00)
Glucose, Bld: 118 mg/dL — ABNORMAL HIGH (ref 70–99)
Potassium: 4 mEq/L (ref 3.5–5.1)
Sodium: 142 mEq/L (ref 135–145)

## 2010-07-14 LAB — BRAIN NATRIURETIC PEPTIDE: Pro B Natriuretic peptide (BNP): 194 pg/mL — ABNORMAL HIGH (ref 0.0–100.0)

## 2010-07-14 MED ORDER — FUROSEMIDE 20 MG PO TABS: 20.0000 mg | ORAL_TABLET | Freq: Two times a day (BID) | ORAL | Status: DC

## 2010-07-14 MED ORDER — AMIODARONE HCL 200 MG PO TABS: ORAL_TABLET | ORAL | Status: DC

## 2010-07-14 NOTE — Assessment & Plan Note (Signed)
Remains on chronic amiodarone therapy.

## 2010-07-14 NOTE — Patient Instructions (Signed)
I have refilled your Lasix and your Pacerone for 90 days supply Stay on your current medicines.  Weigh yourself each morning and record. Take extra dose of diuretic for weight gain of 3 pounds in 24 hours. Limit sodium intake.   I will see you in about 1 month. Call for any problems

## 2010-07-14 NOTE — Telephone Encounter (Signed)
Results reported to pt. Verbalizes understanding to continue current medications.

## 2010-07-14 NOTE — Assessment & Plan Note (Signed)
He appears compensated. It is hard to say what the trigger was for this recent exacerbation. We will check a BMET and BNP today. I will see him back in about 1 month. He is watch his salt and continue to weigh. Patient is agreeable to this plan and will call if any problems develop in the interim.

## 2010-07-14 NOTE — Assessment & Plan Note (Signed)
Has PAF and is on coumadin.

## 2010-07-14 NOTE — Assessment & Plan Note (Signed)
Doing well with no chest pain. We will continue with his current regimen.

## 2010-07-14 NOTE — Telephone Encounter (Signed)
Message copied by Karle Plumber on Wed Jul 14, 2010  1:20 PM ------      Message from: Rosalio Macadamia      Created: Wed Jul 14, 2010  1:08 PM       Ok to report. Labs are satisfactory. BNP is lower. Continue with current medicines.

## 2010-07-14 NOTE — Progress Notes (Signed)
[name] Date of Birth: May 15, 1932   History of Present Illness: [name] is seen today for a post hospital visit. He is seen for Dr. [place]. He was just here on the 25th of May but was then admitted with CHF two days later. He is not sure what the trigger was. He may have had some salt. An echo was done during that admission. It was very technically difficult. LV dysfunction may be moderate. No EF was given.  He now feels good. He is not short of breath. No edema. He was discharged on 40 mg of Lasix BID but he says there was a misunderstanding. He only takes 20 mg BID. His weight is down a little. He currently looks compensated.   Current Outpatient Prescriptions on File Prior to Visit  Medication Sig Dispense Refill  . aspirin 81 MG tablet Take 81 mg by mouth daily. 2 DAILY       . carvedilol (COREG) 25 MG tablet Take 25 mg by mouth 2 (two) times daily with a meal. 12.5MG  IN THE AM, AND 25MG  IN THE PM       . fluticasone (FLONASE) 50 MCG/ACT nasal spray 2 sprays by Nasal route daily.        Marland Kitchen HYDROcodone-acetaminophen (VICODIN) 5-500 MG per tablet Take 1 tablet by mouth every 6 (six) hours as needed.        Marland Kitchen lisinopril (PRINIVIL,ZESTRIL) 10 MG tablet Take 10 mg by mouth 2 (two) times daily.       Marland Kitchen loratadine (CLARITIN) 10 MG tablet Take 10 mg by mouth daily.        Marland Kitchen omega-3 acid ethyl esters (LOVAZA) 1 G capsule Take 1 g by mouth 2 (two) times daily.       . pantoprazole (PROTONIX) 40 MG tablet Take 40 mg by mouth 2 (two) times daily.        Bertram Gala Glycol-Propyl Glycol (SYSTANE OP) Apply to eye as needed.        . potassium chloride (K-DUR) 10 MEQ tablet Take 10 mEq by mouth daily. Take 1/2 tablet daily      . rosuvastatin (CRESTOR) 5 MG tablet Take 5 mg by mouth once a week.        . warfarin (COUMADIN) 5 MG tablet Take 5 mg by mouth as directed.        Marland Kitchen DISCONTD: amiodarone (PACERONE) 200 MG tablet Take one tablet (200 mg) in AM and 1/2 tablet (100 mg) in PM  45  tablet  5  . DISCONTD: furosemide (LASIX) 40 MG tablet Take 20 mg by mouth 2 (two) times daily.         Allergies  Allergen Reactions  . Celebrex (Celecoxib) Hives    Gi upset  . Digoxin   . Multaq (Dronedarone Hydrochloride)   . Nexium Hives    Past Medical History  Diagnosis Date  . Headache   . Dizziness   . SOB (shortness of breath)   . Indigestion   . Difficulty walking   . Chronic back pain   . Atrial fibrillation   . Ischemic cardiomyopathy     WITH CHF  . CHF (congestive heart failure)     EF 35-40%  . Coronary artery disease   . VT (ventricular tachycardia)   . Hypertension   . Dyslipidemia   . Erythrocytosis     Past Surgical History  Procedure Date  . Cardiac defibrillator placement   . Coronary artery bypass graft  1983    History  Smoking status  . Former Smoker  . Quit date: 06/23/1976  Smokeless tobacco  . Not on file    History  Alcohol Use No    Family History  Problem Relation Age of Onset  . Heart disease    . Hypertension    . Stroke    . Diabetes      Review of Systems: The review of systems is positive for allergies. He has had some headaches. No chest pain. He has chronic joint pains.  His breathing and swelling are resolved.  He is not dizzy. All other systems were reviewed and are negative.  Physical Exam: BP 92/54  Pulse 60  Wt 201 lb (91.173 kg) Patient is very pleasant and in no acute distress. Skin is warm and dry. Color is normal.  HEENT is unremarkable. Normocephalic/atraumatic. PERRL. Sclera are nonicteric. Neck is supple. No masses. No JVD. Lungs are clear. Cardiac exam shows a regular rate and rhythm. He does have a grade 2 holosytolic murmur at the apex. No S3.  Abdomen is soft. Extremities are without edema. Gait and ROM are intact. No gross neurologic deficits noted.  LABORATORY DATA: Pending   Assessment / Plan:

## 2010-07-22 NOTE — Discharge Summary (Signed)
NAMECRAIGE, PATEL NO.:  [id number]  MEDICAL RECORD NO.:  [id number]           PATIENT TYPE:  I  LOCATION:  3714                         FACILITY:  [facility]  PHYSICIAN:  Numa Schroeter M. [place], M.D.  DATE OF BIRTH:  April 24, 1932  DATE OF ADMISSION:  06/27/2010 DATE OF DISCHARGE:  06/30/2010                              DISCHARGE SUMMARY   PROCEDURES: 1. Portable chest x-ray. 2. A 2-D echocardiogram.  PRIMARY FINAL DISCHARGE DIAGNOSIS:  Acute-on-chronic systolic congestive heart failure.  SECONDARY DIAGNOSES: 1. Pulmonary arterial stenosis, maintaining sinus rhythm with a     history of cardioversions in February and April 2011. 2. Status post aortocoronary bypass surgery in 1983 with the saphenous     vein graft to left anterior descending. 3. Status post ICD in 1990 with revision at [facility] and currently has a     [employer] defibrillator which she got into 2004. 4. Status post cutting balloon angioplasty and stenting to the left     anterior descending with tandem stents in 2004. 5. Ischemic cardiomyopathy. 6. History of polycythemia treated with phlebotomy. 7. Remote history of cholecystectomy. 8. Gastroesophageal reflux disease. 9. Hyperlipidemia with hypertriglyceridemia. 10.Adult onset diabetes. 11.Family history of coronary artery disease in both parents. 12.Intolerance to Multaq and digoxin. 13.History of monomorphic ventricular tachycardia. 14.Intolerance or allergy to NEXIUM and CELEBREX.  TIME AT DISCHARGE:  Thirty four minutes.  HOSPITAL COURSE:  [name] is a 77 year old male with a history of coronary artery disease.  He had increasing shortness of breath and came to the hospital where he was admitted for further evaluation and treatment.  He was diuresed with IV Lasix and his weight went down about 5 pounds and his I's and O's were negative by approximately 3 liters during this hospital stay.  At discharge, his weight was 200 pounds  and his O2 saturation was 97% on room air.  He still had some dyspnea on exertion, but this is chronic and the patient feels he is at baseline.  His labs were followed closely.  He was mildly anemic with a hemoglobin of 9.9, hematocrit of 33.6.  His MCV was low at 75.  Iron was low at 24, so he will receive iron supplementation.  Cardiac enzymes were cycled and were negative.  His BUN and creatinine were followed closely.  At discharge, his BUN was 27 with a creatinine 1.22 and GFR of 58.  His Coumadin level dropped slightly during his hospital stay but was therapeutic at 2.13 at discharge.  He is to follow up with this.  Home health RN for CHF management was ordered.  By Jun 30, 2010, Mr. Hoagland was ambulating without chest pain or shortness of breath and considered stable for discharge, to follow up as an outpatient.  DISCHARGE INSTRUCTIONS:  His activity level is to be increased gradually.  He is encouraged to stick to a low-sodium diabetic diet.  He is to follow up with Dr. [place] on July 14, 2010, at 9:13 and with Dr. Vassie Loll As needed.  He is to get a Coumadin check next week.  DISCHARGE MEDICATIONS: 1. Lisinopril 10 mg b.i.d.  2. Amiodarone 200 mg 1 tablet a.m. and 1/2 tablet p.m. 3. Tums OTC 1-2 tablets b.i.d. p.r.n. 4. Coumadin 5 mg 1 tablet Sunday and Wednesday, 1/2 tablet other days     in the week. 5. Lovaza 2 caps b.i.d. 6. Coreg 25 mg 1/2 tablet a.m. and 1 tablet p.m. 7. Fluticasone nasal spray. 8. Crestor 5 mg every week. 9. Nu-Iron 150 mg daily. 10.Lasix 40 mg b.i.d. 11.Lasix 20 mg discontinued. 12.Hydrocodone 5 mg p.r.n. 13.Aspirin 81 mg 2 tablets daily. 14.Protonix 40 mg b.i.d. 15.Potassium 10 mEq daily. 16.Claritin 10 mg daily. 17.Dulera 2 puffs t.i.d. 18.ProAir q.6 h p.r.n. 19.Refresh eye drops 1-2 drops daily p.r.n.     Theodore Demark, PA-C   ______________________________ Fard Borunda M. [place], M.D.    RB/MEDQ  D:  06/30/2010  T:  06/30/2010  Job:   [id number]  cc:   Oretha Milch, MD  Electronically Signed by Theodore Demark PA-C on 07/15/2010 03:26:45 PM Electronically Signed by Alicen Donalson [place] M.D. on 07/22/2010 07:42:25 AM

## 2010-07-23 ENCOUNTER — Encounter: Payer: Self-pay | Admitting: *Deleted

## 2010-07-23 ENCOUNTER — Encounter: Payer: Medicare Other | Admitting: *Deleted

## 2010-07-29 ENCOUNTER — Encounter: Payer: Self-pay | Admitting: Cardiology

## 2010-08-02 ENCOUNTER — Other Ambulatory Visit: Payer: Self-pay | Admitting: Cardiology

## 2010-08-02 NOTE — Telephone Encounter (Signed)
escribe medication per fax request  

## 2010-08-11 ENCOUNTER — Encounter: Payer: Self-pay | Admitting: Nurse Practitioner

## 2010-08-11 ENCOUNTER — Ambulatory Visit (INDEPENDENT_AMBULATORY_CARE_PROVIDER_SITE_OTHER): Payer: Medicare Other | Admitting: Nurse Practitioner

## 2010-08-11 ENCOUNTER — Ambulatory Visit (INDEPENDENT_AMBULATORY_CARE_PROVIDER_SITE_OTHER): Payer: Medicare Other | Admitting: *Deleted

## 2010-08-11 VITALS — BP 120/66 | HR 57 | Ht 72.0 in | Wt 206.0 lb

## 2010-08-11 DIAGNOSIS — I5022 Chronic systolic (congestive) heart failure: Secondary | ICD-10-CM

## 2010-08-11 DIAGNOSIS — R0609 Other forms of dyspnea: Secondary | ICD-10-CM

## 2010-08-11 DIAGNOSIS — I4891 Unspecified atrial fibrillation: Secondary | ICD-10-CM

## 2010-08-11 DIAGNOSIS — I2510 Atherosclerotic heart disease of native coronary artery without angina pectoris: Secondary | ICD-10-CM

## 2010-08-11 DIAGNOSIS — R0600 Dyspnea, unspecified: Secondary | ICD-10-CM

## 2010-08-11 LAB — POCT INR: INR: 1.7

## 2010-08-11 NOTE — Assessment & Plan Note (Signed)
No chest pain reported. Will continue with medical management.

## 2010-08-11 NOTE — Assessment & Plan Note (Signed)
Weight is up here today. He was more short of breath yesterday. He is going to increase his Lasix for the next few days. We will continue with his other medicines. We will see him back in 6 weeks. Patient is agreeable to this plan and will call if any problems develop in the interim.

## 2010-08-11 NOTE — Progress Notes (Signed)
[name] Date of Birth: 1932/10/19   History of Present Illness: [name] is seen back today for a one month check. He is seen for Dr. [place]. He has CHF. He says he is doing ok but had more shortness of breath yesterday. He has been out in the heat. Weight is up here today but he says his weight is ok at home. He has no extra swelling. No chest pain.   Current Outpatient Prescriptions on File Prior to Visit  Medication Sig Dispense Refill  . amiodarone (PACERONE) 200 MG tablet Take one tablet (200 mg) in AM and 1/2 tablet (100 mg) in PM  135 tablet  3  . aspirin 81 MG tablet Take 81 mg by mouth daily. 2 DAILY       . calcium carbonate (TUMS - DOSED IN MG ELEMENTAL CALCIUM) 500 MG chewable tablet Chew 1 tablet by mouth as needed.        . carvedilol (COREG) 25 MG tablet Take 25 mg by mouth 2 (two) times daily with a meal. 12.5MG  IN THE AM, AND 25MG  IN THE PM       . fluticasone (FLONASE) 50 MCG/ACT nasal spray 2 sprays by Nasal route daily.        . furosemide (LASIX) 20 MG tablet Take 1 tablet (20 mg total) by mouth 2 (two) times daily.  180 tablet  3  . HYDROcodone-acetaminophen (VICODIN) 5-500 MG per tablet Take 1 tablet by mouth every 6 (six) hours as needed.        . iron polysaccharides (NIFEREX) 150 MG capsule Take 150 mg by mouth daily.        Marland Kitchen lisinopril (PRINIVIL,ZESTRIL) 10 MG tablet Take 1 tablet (10 mg total) by mouth 2 (two) times daily.  60 tablet  5  . loratadine (CLARITIN) 10 MG tablet Take 10 mg by mouth daily.        . Mometasone Furo-Formoterol Fum (DULERA IN) Inhale into the lungs. 2 puffs TID       . omega-3 acid ethyl esters (LOVAZA) 1 G capsule Take 1 g by mouth 2 (two) times daily.       . pantoprazole (PROTONIX) 40 MG tablet Take 40 mg by mouth 2 (two) times daily.        Bertram Gala Glycol-Propyl Glycol (SYSTANE OP) Apply to eye as needed.        . potassium chloride (K-DUR) 10 MEQ tablet Take 10 mEq by mouth daily. Take 1/2 tablet daily      .  rosuvastatin (CRESTOR) 5 MG tablet Take 5 mg by mouth once a week.        . warfarin (COUMADIN) 5 MG tablet Take 5 mg by mouth as directed.          Allergies  Allergen Reactions  . Celebrex (Celecoxib) Hives    Gi upset  . Digoxin   . Multaq (Dronedarone Hydrochloride)   . Nexium Hives    Past Medical History  Diagnosis Date  . Headache   . Dizziness   . SOB (shortness of breath)   . Indigestion   . Difficulty walking   . Chronic back pain   . Atrial fibrillation   . Ischemic cardiomyopathy     WITH CHF  . CHF (congestive heart failure)     EF 35-40%  . Coronary artery disease   . VT (ventricular tachycardia)   . Hypertension   . Dyslipidemia   . Erythrocytosis  Past Surgical History  Procedure Date  . Cardiac defibrillator placement   . Coronary artery bypass graft 1983    History  Smoking status  . Former Smoker  . Quit date: 06/23/1976  Smokeless tobacco  . Not on file    History  Alcohol Use No    Family History  Problem Relation Age of Onset  . Heart disease    . Hypertension    . Stroke    . Diabetes      Review of Systems: The review of systems is positive for more shortness of breath yesterday. Weight is up by our scales by 5 pounds.  All other systems were reviewed and are negative.  Physical Exam: BP 120/66  Pulse 57  Ht 6' (1.829 m)  Wt 206 lb (93.441 kg)  BMI 27.94 kg/m2 Patient is very pleasant and in no acute distress. He appears chronically ill. Skin is warm and dry. Color is sallow but chronic.  HEENT is unremarkable. Normocephalic/atraumatic. PERRL. Sclera are nonicteric. Neck is supple. No masses. No JVD. Lungs are clear. Cardiac exam shows a regular rate and rhythm. Abdomen is soft. Extremities are without any significant edema. Gait and ROM are intact. He uses his cane.  No gross neurologic deficits noted.  LABORATORY DATA: n/a   Assessment / Plan:

## 2010-08-11 NOTE — Patient Instructions (Signed)
I want you to increase your Lasix to 2 tablets in the morning and one tablet in the afternoon for the next 3 days Then you may resume your regular dose of just one tablet twice a day Continue to watch your salt intake Stay out of the heat We will see you in about 6 weeks

## 2010-08-11 NOTE — Assessment & Plan Note (Signed)
Remains in sinus by exam. On amiodarone and coumadin.

## 2010-08-20 ENCOUNTER — Encounter (HOSPITAL_BASED_OUTPATIENT_CLINIC_OR_DEPARTMENT_OTHER): Payer: Medicare Other | Admitting: Oncology

## 2010-08-20 ENCOUNTER — Other Ambulatory Visit (HOSPITAL_COMMUNITY): Payer: Self-pay | Admitting: Oncology

## 2010-08-20 DIAGNOSIS — Z7901 Long term (current) use of anticoagulants: Secondary | ICD-10-CM

## 2010-08-20 DIAGNOSIS — D649 Anemia, unspecified: Secondary | ICD-10-CM

## 2010-08-20 LAB — CBC WITH DIFFERENTIAL/PLATELET
BASO%: 0.3 % (ref 0.0–2.0)
HCT: 31.7 % — ABNORMAL LOW (ref 38.4–49.9)
MCHC: 31.6 g/dL — ABNORMAL LOW (ref 32.0–36.0)
MONO#: 1.2 10*3/uL — ABNORMAL HIGH (ref 0.1–0.9)
NEUT%: 57.9 % (ref 39.0–75.0)
WBC: 6.5 10*3/uL (ref 4.0–10.3)
lymph#: 1.4 10*3/uL (ref 0.9–3.3)

## 2010-08-20 LAB — FERRITIN: Ferritin: 16 ng/mL — ABNORMAL LOW (ref 22–322)

## 2010-08-20 LAB — IRON AND TIBC
Iron: 25 ug/dL — ABNORMAL LOW (ref 42–165)
TIBC: 449 ug/dL — ABNORMAL HIGH (ref 215–435)
UIBC: 424 ug/dL

## 2010-08-25 ENCOUNTER — Encounter: Payer: Medicare Other | Admitting: *Deleted

## 2010-08-26 ENCOUNTER — Ambulatory Visit (INDEPENDENT_AMBULATORY_CARE_PROVIDER_SITE_OTHER): Payer: Medicare Other | Admitting: *Deleted

## 2010-08-26 DIAGNOSIS — I4891 Unspecified atrial fibrillation: Secondary | ICD-10-CM

## 2010-09-22 ENCOUNTER — Encounter: Payer: Self-pay | Admitting: Nurse Practitioner

## 2010-09-22 ENCOUNTER — Ambulatory Visit (INDEPENDENT_AMBULATORY_CARE_PROVIDER_SITE_OTHER): Payer: Medicare Other | Admitting: Nurse Practitioner

## 2010-09-22 ENCOUNTER — Ambulatory Visit (INDEPENDENT_AMBULATORY_CARE_PROVIDER_SITE_OTHER): Payer: Medicare Other | Admitting: *Deleted

## 2010-09-22 ENCOUNTER — Encounter: Payer: Medicare Other | Admitting: *Deleted

## 2010-09-22 VITALS — BP 102/44 | HR 60 | Ht 72.0 in | Wt 204.0 lb

## 2010-09-22 DIAGNOSIS — R0600 Dyspnea, unspecified: Secondary | ICD-10-CM

## 2010-09-22 DIAGNOSIS — I2510 Atherosclerotic heart disease of native coronary artery without angina pectoris: Secondary | ICD-10-CM

## 2010-09-22 DIAGNOSIS — I4891 Unspecified atrial fibrillation: Secondary | ICD-10-CM

## 2010-09-22 DIAGNOSIS — I498 Other specified cardiac arrhythmias: Secondary | ICD-10-CM

## 2010-09-22 DIAGNOSIS — I509 Heart failure, unspecified: Secondary | ICD-10-CM

## 2010-09-22 DIAGNOSIS — Z95810 Presence of automatic (implantable) cardiac defibrillator: Secondary | ICD-10-CM

## 2010-09-22 DIAGNOSIS — R001 Bradycardia, unspecified: Secondary | ICD-10-CM

## 2010-09-22 DIAGNOSIS — Z7901 Long term (current) use of anticoagulants: Secondary | ICD-10-CM

## 2010-09-22 DIAGNOSIS — I5022 Chronic systolic (congestive) heart failure: Secondary | ICD-10-CM

## 2010-09-22 DIAGNOSIS — R0609 Other forms of dyspnea: Secondary | ICD-10-CM

## 2010-09-22 LAB — BASIC METABOLIC PANEL
BUN: 37 mg/dL — ABNORMAL HIGH (ref 6–23)
CO2: 27 mEq/L (ref 19–32)
Calcium: 9 mg/dL (ref 8.4–10.5)
Chloride: 108 mEq/L (ref 96–112)
Creatinine, Ser: 1.5 mg/dL (ref 0.4–1.5)
GFR: 48.78 mL/min — ABNORMAL LOW (ref >60.00)
Glucose, Bld: 102 mg/dL — ABNORMAL HIGH (ref 70–99)
Potassium: 4.3 mEq/L (ref 3.5–5.1)
Sodium: 143 mEq/L (ref 135–145)

## 2010-09-22 LAB — BRAIN NATRIURETIC PEPTIDE: Pro B Natriuretic peptide (BNP): 251 pg/mL — ABNORMAL HIGH (ref 0.0–100.0)

## 2010-09-22 NOTE — Patient Instructions (Signed)
Stay on your current medicines We will see you back in 2 months. You will see Dr. [place]. Call for any problems

## 2010-09-22 NOTE — Assessment & Plan Note (Signed)
No discharges reported.

## 2010-09-22 NOTE — Assessment & Plan Note (Signed)
Seems to be tolerating ok.

## 2010-09-22 NOTE — Assessment & Plan Note (Signed)
He has chronic shortness of breath. Oxygen sats are ok today. We will check his BNP today and chemistries. Continue with his current medicines. Will see him back in 2 months with Dr. [place]. Patient is agreeable to this plan and will call if any problems develop in the interim.

## 2010-09-22 NOTE — Progress Notes (Signed)
[name] Date of Birth: Sep 06, 1932   History of Present Illness: [name] is seen back today for a 6 week visit. He is seen for Dr. [place]. He has CHF. EF is 35%. He has his ICD in place. He is on amiodarone for recurrent VTach. He has chronic shortness of breath, especially with exertion. Weight has been stable at home per his report. Blood pressure is ok. He has had a few low heart rates with very rare dizziness. He says he is tolerating his medicines. No chest pain. No cough.   Current Outpatient Prescriptions on File Prior to Visit  Medication Sig Dispense Refill  . amiodarone (PACERONE) 200 MG tablet Take one tablet (200 mg) in AM and 1/2 tablet (100 mg) in PM  135 tablet  3  . aspirin 81 MG tablet Take 81 mg by mouth daily. 2 DAILY       . calcium carbonate (TUMS - DOSED IN MG ELEMENTAL CALCIUM) 500 MG chewable tablet Chew 1 tablet by mouth as needed.        . carvedilol (COREG) 25 MG tablet Take 25 mg by mouth 2 (two) times daily with a meal. 12.5MG  IN THE AM, AND 25MG  IN THE PM       . fluticasone (FLONASE) 50 MCG/ACT nasal spray 2 sprays by Nasal route daily.        . furosemide (LASIX) 20 MG tablet Take 1 tablet (20 mg total) by mouth 2 (two) times daily.  180 tablet  3  . HYDROcodone-acetaminophen (VICODIN) 5-500 MG per tablet Take 1 tablet by mouth every 6 (six) hours as needed.        . iron polysaccharides (NIFEREX) 150 MG capsule Take 150 mg by mouth 2 (two) times daily.       Marland Kitchen lisinopril (PRINIVIL,ZESTRIL) 10 MG tablet Take 1 tablet (10 mg total) by mouth 2 (two) times daily.  60 tablet  5  . loratadine (CLARITIN) 10 MG tablet Take 10 mg by mouth daily.        . Mometasone Furo-Formoterol Fum (DULERA IN) Inhale into the lungs. 2 puffs TID       . omega-3 acid ethyl esters (LOVAZA) 1 G capsule Take 1 g by mouth 2 (two) times daily.       . pantoprazole (PROTONIX) 40 MG tablet Take 40 mg by mouth 2 (two) times daily.        Bertram Gala Glycol-Propyl Glycol (SYSTANE  OP) Apply to eye as needed.        . potassium chloride (K-DUR) 10 MEQ tablet Take 10 mEq by mouth daily. Take 1/2 tablet daily      . rosuvastatin (CRESTOR) 5 MG tablet Take 5 mg by mouth once a week.        . warfarin (COUMADIN) 5 MG tablet Take 5 mg by mouth as directed.          Allergies  Allergen Reactions  . Celebrex (Celecoxib) Hives    Gi upset  . Digoxin   . Multaq (Dronedarone Hydrochloride)   . Nexium Hives    Past Medical History  Diagnosis Date  . Headache   . Dizziness   . SOB (shortness of breath)   . Indigestion   . Difficulty walking   . Chronic back pain   . Atrial fibrillation   . Ischemic cardiomyopathy     WITH CHF  . CHF (congestive heart failure)     EF 35-40%  . Coronary artery disease   .  VT (ventricular tachycardia)   . Hypertension   . Dyslipidemia   . Erythrocytosis     Past Surgical History  Procedure Date  . Cardiac defibrillator placement   . Coronary artery bypass graft 1983    History  Smoking status  . Former Smoker  . Quit date: 06/23/1976  Smokeless tobacco  . Not on file    History  Alcohol Use No    Family History  Problem Relation Age of Onset  . Heart disease    . Hypertension    . Stroke    . Diabetes      Review of Systems: The review of systems is positive for chronic shortness of breath. No swelling. NO ICD discharges. Activity remains somewhat limited.  No significant bruising. No bleeding reported. INR today was good. All other systems were reviewed and are negative.  Physical Exam: BP 102/44  Pulse 60  Ht 6' (1.829 m)  Wt 204 lb (92.534 kg)  BMI 27.67 kg/m2 Pulse ox was 97% at rest and drops only to 94% with walking in the office. Patient is pleasant and in no acute distress. He does appear chronically ill. Skin is warm and dry. Color is chronically sallow.  HEENT is unremarkable. Normocephalic/atraumatic. PERRL. Sclera are nonicteric. Neck is supple. No masses. No JVD. Lungs are fairly clear.  Cardiac exam shows a regular rate and rhythm. Abdomen is soft. Extremities are without edema. Gait and ROM are intact. No gross neurologic deficits noted.   LABORATORY DATA: PENDING   Assessment / Plan:

## 2010-09-22 NOTE — Assessment & Plan Note (Signed)
He has some transient heart rates to the high 40's and low 50's. Not really symptomatic. I assume he has back up pacing at 40. I have left him on his current medicines for now. He is to let us know if he has any worsening of symptoms.

## 2010-09-22 NOTE — Assessment & Plan Note (Signed)
No chest pain reported. No change in his medicines.  

## 2010-09-24 ENCOUNTER — Telehealth: Payer: Self-pay | Admitting: *Deleted

## 2010-09-24 NOTE — Telephone Encounter (Signed)
Notified of lab results. Will send results to Dr. Felipa Eth and pt.

## 2010-09-24 NOTE — Telephone Encounter (Signed)
Message copied by Lorayne Bender on Fri Sep 24, 2010  2:51 PM ------      Message from: Rosalio Macadamia      Created: Thu Sep 23, 2010  7:35 AM       Ok to report. Labs are satisfactory. BNP not too bad. Extra lasix prn.

## 2010-09-27 ENCOUNTER — Emergency Department (HOSPITAL_BASED_OUTPATIENT_CLINIC_OR_DEPARTMENT_OTHER)
Admission: EM | Admit: 2010-09-27 | Discharge: 2010-09-27 | Disposition: A | Discharge status: Home / Self Care | Payer: Medicare Other | Attending: Emergency Medicine | Admitting: Emergency Medicine

## 2010-09-27 ENCOUNTER — Other Ambulatory Visit: Payer: Self-pay

## 2010-09-27 ENCOUNTER — Emergency Department (INDEPENDENT_AMBULATORY_CARE_PROVIDER_SITE_OTHER): Payer: Medicare Other

## 2010-09-27 ENCOUNTER — Encounter (HOSPITAL_BASED_OUTPATIENT_CLINIC_OR_DEPARTMENT_OTHER): Payer: Self-pay

## 2010-09-27 DIAGNOSIS — I509 Heart failure, unspecified: Secondary | ICD-10-CM

## 2010-09-27 DIAGNOSIS — I10 Essential (primary) hypertension: Secondary | ICD-10-CM | POA: Insufficient documentation

## 2010-09-27 DIAGNOSIS — Z951 Presence of aortocoronary bypass graft: Secondary | ICD-10-CM | POA: Insufficient documentation

## 2010-09-27 DIAGNOSIS — I2510 Atherosclerotic heart disease of native coronary artery without angina pectoris: Secondary | ICD-10-CM | POA: Insufficient documentation

## 2010-09-27 DIAGNOSIS — R0602 Shortness of breath: Secondary | ICD-10-CM

## 2010-09-27 DIAGNOSIS — R079 Chest pain, unspecified: Secondary | ICD-10-CM

## 2010-09-27 DIAGNOSIS — R42 Dizziness and giddiness: Secondary | ICD-10-CM

## 2010-09-27 DIAGNOSIS — Z4502 Encounter for adjustment and management of automatic implantable cardiac defibrillator: Secondary | ICD-10-CM

## 2010-09-27 LAB — BASIC METABOLIC PANEL
Chloride: 104 mEq/L (ref 96–112)
Creatinine, Ser: 1.2 mg/dL (ref 0.50–1.35)
GFR calc Af Amer: >60 mL/min (ref >60)
GFR calc non Af Amer: 59 mL/min — ABNORMAL LOW (ref >60)
Potassium: 4.4 mEq/L (ref 3.5–5.1)

## 2010-09-27 LAB — PROTIME-INR: Prothrombin Time: 33.2 seconds — ABNORMAL HIGH (ref 11.6–15.2)

## 2010-09-27 LAB — DIFFERENTIAL
Basophils Absolute: 0 10*3/uL (ref 0.0–0.1)
Basophils Relative: 1 % (ref 0–1)
Neutro Abs: 3.8 10*3/uL (ref 1.7–7.7)
Neutrophils Relative %: 56 % (ref 43–77)

## 2010-09-27 LAB — CBC
MCHC: 29.1 g/dL — ABNORMAL LOW (ref 30.0–36.0)
Platelets: 223 10*3/uL (ref 150–400)
RDW: 17.9 % — ABNORMAL HIGH (ref 11.5–15.5)

## 2010-09-27 LAB — PHOSPHORUS: Phosphorus: 2.7 mg/dL (ref 2.3–4.6)

## 2010-09-27 LAB — TROPONIN I: Troponin I: <0.3 ng/mL (ref <0.30)

## 2010-09-27 MED ORDER — FUROSEMIDE 40 MG PO TABS
40.0000 mg | ORAL_TABLET | Freq: Once | ORAL | Status: AC
  Administered 2010-09-27: 40 mg via ORAL
  Filled 2010-09-27: qty 1

## 2010-09-27 NOTE — ED Provider Notes (Signed)
History     CSN: [id number] Arrival date & time: 09/27/2010  6:47 AM  Chief Complaint  Patient presents with  . Pacemaker Check    Pt states his defibrillator fired   HPI Comments: Hx CAD s/p CABG, CHF (EF 35-40% 2/2 ischemic cardiomyopathy) s/p AICD, A fib on coumadin, V tach on amiodarone, chronic SOB, presenting from home with complaint of AICD fired.  Was dozing in bed and felt "soft shock" about 530.  Has been shocked before.  Feels back to baseline now.  NO chest pain, SOB at baseline.  C/o "reflux pain" which is chronic for him.  Denies fever, cough, leg pain or swelling, weight change, med change, nausea, vomiting, abdominal pain.  The history is provided by the patient.    Past Medical History  Diagnosis Date  . Headache   . Dizziness   . SOB (shortness of breath)   . Indigestion   . Difficulty walking   . Chronic back pain   . Atrial fibrillation   . Ischemic cardiomyopathy     WITH CHF  . CHF (congestive heart failure)     EF 35-40%  . Coronary artery disease   . VT (ventricular tachycardia)   . Hypertension   . Dyslipidemia   . Erythrocytosis     Past Surgical History  Procedure Date  . Cardiac defibrillator placement   . Coronary artery bypass graft 1983    Family History  Problem Relation Age of Onset  . Heart disease    . Hypertension    . Stroke    . Diabetes      History  Substance Use Topics  . Smoking status: Former Smoker    Quit date: 06/23/1976  . Smokeless tobacco: Not on file  . Alcohol Use: No      Review of Systems  Constitutional: Negative for fever and activity change.  HENT: Negative for congestion and rhinorrhea.   Respiratory: Positive for shortness of breath. Negative for cough and chest tightness.   Cardiovascular: Negative for chest pain and leg swelling.  Gastrointestinal: Negative for nausea, vomiting and abdominal pain.  Genitourinary: Negative for dysuria and hematuria.  Musculoskeletal: Negative for back pain.    Neurological: Positive for dizziness, weakness and light-headedness. Negative for seizures and headaches.  Hematological: Negative.     Physical Exam  BP 96/50  Pulse 56  Temp(Src) 97.9 F (36.6 C) (Oral)  Resp 14  Ht 6' (1.829 m)  Wt 202 lb (91.627 kg)  BMI 27.40 kg/m2  SpO2 92%  Physical Exam  Constitutional: He is oriented to person, place, and time. He appears well-developed and well-nourished. No distress.  HENT:  Head: Normocephalic and atraumatic.  Mouth/Throat: Oropharynx is clear and moist.  Eyes: Conjunctivae are normal. Pupils are equal, round, and reactive to light.  Neck: Normal range of motion.  Cardiovascular: Normal rate, regular rhythm and normal heart sounds.        +2 DP and radial pulses  Pulmonary/Chest: Effort normal and breath sounds normal. No respiratory distress.  Abdominal: Soft. There is tenderness. There is no rebound and no guarding.       Mild epigastric tenderness  Musculoskeletal: Normal range of motion. He exhibits no edema and no tenderness.  Neurological: He is alert and oriented to person, place, and time. No cranial nerve deficit.  Skin: Skin is warm.    ED Course  Procedures    Date: 09/27/2010  Rate: 56  Rhythm: sinus tachycardia  QRS Axis: normal  Intervals:  PR prolonged  ST/T Wave abnormalities: nonspecific T wave changes  Conduction Disutrbances:first-degree A-V block   Narrative Interpretation:   Old EKG Reviewed: unchanged  MDM Hx CHF, CAD s/p AICD with complaint of AICD fired.  Back to baseline now.  Asymptomatic except for chronic "reflux pain" in epigastrum.  Labs with cardiac enzymes, BNP, CXR, interrogate AICD, d/w cardiology  Medtronic tech paged.  D/w cardiology.  Will attempt to interrogate AICD before transfer.  If no VT or VF, likely can be discharged.  922: pacer interrogation complete.  No shocks.  No VF or VT.  930: Results discussed with Dr. Swaziland of [facility] cardiology.  Agrees no need for admission.   Slight elevation of BNP and vascular congestion on CXR discussed.  Patient takes lasix on prn basis based on weight change.  Lungs clear here, no JVD, no respiratory distress, no peripheral edema.  PO lasix given here.  Stable for outpatient followup.  1045; delta troponin negative.  Patient asymptomatic and at baseline.  Results for orders placed during the hospital encounter of 09/27/10  CBC      Component Value Range   WBC 6.8  4.0 - 10.5 (K/uL)   RBC 4.33  4.22 - 5.81 (MIL/uL)   Hemoglobin 9.6 (*) 13.0 - 17.0 (g/dL)   HCT 29.5 (*) 62.1 - 52.0 (%)   MCV 76.2 (*) 78.0 - 100.0 (fL)   MCH 22.2 (*) 26.0 - 34.0 (pg)   MCHC 29.1 (*) 30.0 - 36.0 (g/dL)   RDW 30.8 (*) 65.7 - 15.5 (%)   Platelets 223  150 - 400 (K/uL)  DIFFERENTIAL      Component Value Range   Neutrophils Relative 56  43 - 77 (%)   Neutro Abs 3.8  1.7 - 7.7 (K/uL)   Lymphocytes Relative 19  12 - 46 (%)   Lymphs Abs 1.3  0.7 - 4.0 (K/uL)   Monocytes Relative 23 (*) 3 - 12 (%)   Monocytes Absolute 1.6 (*) 0.1 - 1.0 (K/uL)   Eosinophils Relative 2  0 - 5 (%)   Eosinophils Absolute 0.1  0.0 - 0.7 (K/uL)   Basophils Relative 1  0 - 1 (%)   Basophils Absolute 0.0  0.0 - 0.1 (K/uL)  BASIC METABOLIC PANEL      Component Value Range   Sodium 140  135 - 145 (mEq/L)   Potassium 4.4  3.5 - 5.1 (mEq/L)   Chloride 104  96 - 112 (mEq/L)   CO2 26  19 - 32 (mEq/L)   Glucose, Bld 138 (*) 70 - 99 (mg/dL)   BUN 24 (*) 6 - 23 (mg/dL)   Creatinine, Ser 8.46  0.50 - 1.35 (mg/dL)   Calcium 9.5  8.4 - 96.2 (mg/dL)   GFR calc non Af Amer 59 (*) >60 (mL/min)   GFR calc Af Amer >60  >60 (mL/min)  TROPONIN I      Component Value Range   Troponin I <0.30  <0.30 (ng/mL)  MAGNESIUM      Component Value Range   Magnesium 2.1  1.5 - 2.5 (mg/dL)  PHOSPHORUS      Component Value Range   Phosphorus 2.7  2.3 - 4.6 (mg/dL)  PROTIME-INR      Component Value Range   Prothrombin Time 33.2 (*) 11.6 - 15.2 (seconds)   INR 3.19 (*) 0.00 - 1.49     PRO B NATRIURETIC PEPTIDE      Component Value Range   BNP, POC 548.2 (*) 0 -  450 (pg/mL)   Dg Chest 2 View  09/27/2010  *RADIOLOGY REPORT*  Clinical Data: Chest pain, shortness of breath and dizziness.  CHEST - 2 VIEW  Comparison: 06/27/2010.  Findings: Trachea is midline.  Heart size stable.  Thoracic aorta is calcified. Support apparatus stable.  Mild interstitial prominence with small bilateral pleural effusions.  No focal airspace consolidation.  Minimal bibasilar atelectasis. Degenerative changes are seen in the spine.  IMPRESSION: Mild congestive heart failure.  Original Report Authenticated By: Reyes Ivan, M.D.      Glynn Octave, MD 09/27/10 1045

## 2010-09-27 NOTE — ED Notes (Signed)
Called taxi for transportation to [address]

## 2010-09-27 NOTE — ED Notes (Addendum)
Called [facility]-spoke with [name] for a recall

## 2010-09-27 NOTE — ED Notes (Signed)
Pt returned from radiology.  SB rate of 57 per cardiac monitor.  Pt denies any pain at present time.

## 2010-09-27 NOTE — ED Notes (Signed)
Pt states at about 0540 he woke up and thought that his defibrillator fired "but it could just be acid reflux".  Per [facility] Sinus brady, BBB on ecg.

## 2010-09-27 NOTE — ED Notes (Signed)
Report received from Perry Mount, RN care assumed.

## 2010-09-27 NOTE — ED Notes (Signed)
MD at bedside. Plan of care discussed with pt.  Warm blankets provided.  Awaiting defibrillator check.

## 2010-09-27 NOTE — ED Notes (Addendum)
Pt transported to radiology.

## 2010-09-27 NOTE — ED Notes (Signed)
Called for [facility] to come out to check out machine @7 :12

## 2010-09-29 ENCOUNTER — Other Ambulatory Visit: Payer: Self-pay | Admitting: Internal Medicine

## 2010-09-30 ENCOUNTER — Ambulatory Visit (INDEPENDENT_AMBULATORY_CARE_PROVIDER_SITE_OTHER): Payer: Medicare Other | Admitting: *Deleted

## 2010-09-30 DIAGNOSIS — I472 Ventricular tachycardia, unspecified: Secondary | ICD-10-CM

## 2010-10-13 ENCOUNTER — Encounter: Payer: Self-pay | Admitting: *Deleted

## 2010-10-15 ENCOUNTER — Other Ambulatory Visit (HOSPITAL_COMMUNITY): Payer: Self-pay | Admitting: Internal Medicine

## 2010-10-15 DIAGNOSIS — E079 Disorder of thyroid, unspecified: Secondary | ICD-10-CM

## 2010-10-15 NOTE — Progress Notes (Signed)
ICD checked by remote. 

## 2010-10-18 ENCOUNTER — Other Ambulatory Visit (HOSPITAL_COMMUNITY): Payer: Self-pay | Admitting: Oncology

## 2010-10-18 ENCOUNTER — Encounter (HOSPITAL_BASED_OUTPATIENT_CLINIC_OR_DEPARTMENT_OTHER): Payer: Medicare Other | Admitting: Oncology

## 2010-10-18 DIAGNOSIS — D751 Secondary polycythemia: Secondary | ICD-10-CM

## 2010-10-18 DIAGNOSIS — D649 Anemia, unspecified: Secondary | ICD-10-CM

## 2010-10-18 DIAGNOSIS — Z7901 Long term (current) use of anticoagulants: Secondary | ICD-10-CM

## 2010-10-18 LAB — CBC WITH DIFFERENTIAL/PLATELET
BASO%: 0.4 % (ref 0.0–2.0)
Basophils Absolute: 0 10*3/uL (ref 0.0–0.1)
HCT: 30.1 % — ABNORMAL LOW (ref 38.4–49.9)
HGB: 9.5 g/dL — ABNORMAL LOW (ref 13.0–17.1)
LYMPH%: 13.1 % — ABNORMAL LOW (ref 14.0–49.0)
MCHC: 31.4 g/dL — ABNORMAL LOW (ref 32.0–36.0)
MONO#: 1.4 10*3/uL — ABNORMAL HIGH (ref 0.1–0.9)
NEUT%: 64.4 % (ref 39.0–75.0)
Platelets: 210 10*3/uL (ref 140–400)
WBC: 6.9 10*3/uL (ref 4.0–10.3)

## 2010-10-20 ENCOUNTER — Ambulatory Visit (INDEPENDENT_AMBULATORY_CARE_PROVIDER_SITE_OTHER): Payer: Medicare Other | Admitting: *Deleted

## 2010-10-20 DIAGNOSIS — I4891 Unspecified atrial fibrillation: Secondary | ICD-10-CM

## 2010-10-21 ENCOUNTER — Encounter (HOSPITAL_BASED_OUTPATIENT_CLINIC_OR_DEPARTMENT_OTHER): Payer: Medicare Other | Admitting: Oncology

## 2010-10-21 DIAGNOSIS — D751 Secondary polycythemia: Secondary | ICD-10-CM

## 2010-10-26 ENCOUNTER — Ambulatory Visit: Payer: Medicare Other | Admitting: Cardiology

## 2010-10-28 ENCOUNTER — Ambulatory Visit (INDEPENDENT_AMBULATORY_CARE_PROVIDER_SITE_OTHER): Payer: Medicare Other | Admitting: *Deleted

## 2010-10-28 ENCOUNTER — Other Ambulatory Visit: Payer: Self-pay | Admitting: Internal Medicine

## 2010-10-28 ENCOUNTER — Encounter: Payer: Self-pay | Admitting: Internal Medicine

## 2010-10-28 DIAGNOSIS — R001 Bradycardia, unspecified: Secondary | ICD-10-CM

## 2010-10-28 DIAGNOSIS — I472 Ventricular tachycardia: Secondary | ICD-10-CM

## 2010-10-28 DIAGNOSIS — Z95810 Presence of automatic (implantable) cardiac defibrillator: Secondary | ICD-10-CM

## 2010-10-28 DIAGNOSIS — I4891 Unspecified atrial fibrillation: Secondary | ICD-10-CM

## 2010-11-04 NOTE — Progress Notes (Signed)
icd remote check  

## 2010-11-05 ENCOUNTER — Other Ambulatory Visit (HOSPITAL_COMMUNITY): Payer: Self-pay | Admitting: Oncology

## 2010-11-05 ENCOUNTER — Encounter (HOSPITAL_BASED_OUTPATIENT_CLINIC_OR_DEPARTMENT_OTHER): Payer: Medicare Other | Admitting: Oncology

## 2010-11-05 DIAGNOSIS — D751 Secondary polycythemia: Secondary | ICD-10-CM

## 2010-11-05 DIAGNOSIS — Z7901 Long term (current) use of anticoagulants: Secondary | ICD-10-CM

## 2010-11-05 DIAGNOSIS — D649 Anemia, unspecified: Secondary | ICD-10-CM

## 2010-11-05 LAB — COMPREHENSIVE METABOLIC PANEL
ALT: 11 U/L (ref 0–53)
AST: 21 U/L (ref 0–37)
Albumin: 4.3 g/dL (ref 3.5–5.2)
Alkaline Phosphatase: 58 U/L (ref 39–117)
BUN: 20 mg/dL (ref 6–23)
CO2: 23 mEq/L (ref 19–32)
Calcium: 9.3 mg/dL (ref 8.4–10.5)
Chloride: 106 mEq/L (ref 96–112)
Creatinine, Ser: 1.47 mg/dL — ABNORMAL HIGH (ref 0.50–1.35)
Glucose, Bld: 99 mg/dL (ref 70–99)
Potassium: 4.9 mEq/L (ref 3.5–5.3)
Sodium: 140 mEq/L (ref 135–145)
Total Bilirubin: 0.7 mg/dL (ref 0.3–1.2)
Total Protein: 6.5 g/dL (ref 6.0–8.3)

## 2010-11-05 LAB — CBC WITH DIFFERENTIAL/PLATELET
Basophils Absolute: 0 10*3/uL (ref 0.0–0.1)
Eosinophils Absolute: 0.1 10*3/uL (ref 0.0–0.5)
HCT: 34.6 % — ABNORMAL LOW (ref 38.4–49.9)
HGB: 10.2 g/dL — ABNORMAL LOW (ref 13.0–17.1)
LYMPH%: 20 % (ref 14.0–49.0)
MCV: 75.5 fL — ABNORMAL LOW (ref 79.3–98.0)
MONO#: 1.8 10*3/uL — ABNORMAL HIGH (ref 0.1–0.9)
MONO%: 23.6 % — ABNORMAL HIGH (ref 0.0–14.0)
NEUT#: 4.1 10*3/uL (ref 1.5–6.5)
NEUT%: 54.9 % (ref 39.0–75.0)
Platelets: 291 10*3/uL (ref 140–400)
RBC: 4.58 10*6/uL (ref 4.20–5.82)
WBC: 7.5 10*3/uL (ref 4.0–10.3)

## 2010-11-05 LAB — IRON AND TIBC
%SAT: 3 % — ABNORMAL LOW (ref 20–55)
Iron: 13 ug/dL — ABNORMAL LOW (ref 42–165)
TIBC: 382 ug/dL (ref 215–435)
UIBC: 369 ug/dL (ref 125–400)

## 2010-11-05 LAB — LACTATE DEHYDROGENASE: LDH: 192 U/L (ref 94–250)

## 2010-11-08 ENCOUNTER — Encounter (HOSPITAL_COMMUNITY)
Admission: RE | Admit: 2010-11-08 | Discharge: 2010-11-08 | Disposition: A | Discharge status: Home / Self Care | Payer: Medicare Other | Source: Ambulatory Visit | Attending: Internal Medicine | Admitting: Internal Medicine

## 2010-11-08 DIAGNOSIS — E079 Disorder of thyroid, unspecified: Secondary | ICD-10-CM | POA: Insufficient documentation

## 2010-11-09 ENCOUNTER — Other Ambulatory Visit (HOSPITAL_COMMUNITY): Payer: Self-pay | Admitting: Internal Medicine

## 2010-11-09 ENCOUNTER — Encounter (HOSPITAL_COMMUNITY)
Admission: RE | Admit: 2010-11-09 | Discharge: 2010-11-09 | Disposition: A | Discharge status: Home / Self Care | Payer: Medicare Other | Source: Ambulatory Visit | Attending: Internal Medicine | Admitting: Internal Medicine

## 2010-11-09 DIAGNOSIS — E0590 Thyrotoxicosis, unspecified without thyrotoxic crisis or storm: Secondary | ICD-10-CM | POA: Insufficient documentation

## 2010-11-09 DIAGNOSIS — E079 Disorder of thyroid, unspecified: Secondary | ICD-10-CM

## 2010-11-09 MED ORDER — SODIUM IODIDE I 131 CAPSULE
8.8000 | Freq: Once | INTRAVENOUS | Status: AC | PRN
  Administered 2010-11-08: 8.8 via ORAL

## 2010-11-11 LAB — BASIC METABOLIC PANEL
BUN: 11
CO2: 24
CO2: 27
Calcium: 9.4
Chloride: 109
Creatinine, Ser: 1.18
GFR calc Af Amer: >60
GFR calc Af Amer: >60
Glucose, Bld: 134 — ABNORMAL HIGH
Potassium: 3.9
Sodium: 138

## 2010-11-11 LAB — TSH: TSH: 0.412

## 2010-11-11 LAB — HEPATIC FUNCTION PANEL
Bilirubin, Direct: 0.3
Total Bilirubin: 1.1

## 2010-11-11 LAB — I-STAT 8, (EC8 V) (CONVERTED LAB)
Acid-Base Excess: 1
Chloride: 107
HCT: 52
Hemoglobin: 17.7 — ABNORMAL HIGH
Operator id: 285841
Potassium: 3.7
Sodium: 140
pCO2, Ven: 34.3 — ABNORMAL LOW

## 2010-11-11 LAB — CK TOTAL AND CKMB (NOT AT ARMC)
CK, MB: 2.4
Relative Index: 1.8
Relative Index: 2.2
Total CK: 127
Total CK: 137

## 2010-11-11 LAB — CBC
Hemoglobin: 14.6
Hemoglobin: 16.1
MCHC: 33.9
MCHC: 34
MCHC: 34.2
MCV: 86.7
MCV: 87.9
Platelets: 245
RBC: 4.96
RBC: 5.41
RDW: 14.4 — ABNORMAL HIGH
RDW: 14.6 — ABNORMAL HIGH

## 2010-11-11 LAB — DIFFERENTIAL
Basophils Absolute: 0
Basophils Relative: 0
Eosinophils Absolute: 0.1
Monocytes Absolute: 0.8 — ABNORMAL HIGH
Monocytes Relative: 11
Neutrophils Relative %: 66

## 2010-11-11 LAB — COMPREHENSIVE METABOLIC PANEL
AST: 25
Albumin: 4.3
Chloride: 106
Creatinine, Ser: 1.09
GFR calc Af Amer: >60
Total Bilirubin: 1.2

## 2010-11-11 LAB — LIPID PANEL
HDL: 20 — ABNORMAL LOW
Triglycerides: 342 — ABNORMAL HIGH
VLDL: 68 — ABNORMAL HIGH

## 2010-11-11 LAB — B-NATRIURETIC PEPTIDE (CONVERTED LAB): Pro B Natriuretic peptide (BNP): 111 — ABNORMAL HIGH

## 2010-11-11 LAB — POCT I-STAT CREATININE: Creatinine, Ser: 1.3

## 2010-11-11 LAB — PROTIME-INR
INR: 1.1
Prothrombin Time: 14.5

## 2010-11-11 LAB — HEMOGLOBIN A1C: Hgb A1c MFr Bld: 6.5 — ABNORMAL HIGH

## 2010-11-11 LAB — POCT CARDIAC MARKERS: Operator id: 285841

## 2010-11-12 ENCOUNTER — Ambulatory Visit: Payer: Medicare Other | Admitting: Cardiology

## 2010-11-12 ENCOUNTER — Encounter: Payer: Self-pay | Admitting: *Deleted

## 2010-11-12 ENCOUNTER — Encounter: Payer: Medicare Other | Admitting: *Deleted

## 2010-11-14 ENCOUNTER — Other Ambulatory Visit: Payer: Self-pay | Admitting: Cardiology

## 2010-11-15 ENCOUNTER — Encounter: Payer: Self-pay | Admitting: Cardiology

## 2010-11-15 ENCOUNTER — Ambulatory Visit (INDEPENDENT_AMBULATORY_CARE_PROVIDER_SITE_OTHER): Payer: Medicare Other | Admitting: Cardiology

## 2010-11-15 ENCOUNTER — Ambulatory Visit (INDEPENDENT_AMBULATORY_CARE_PROVIDER_SITE_OTHER): Payer: Medicare Other | Admitting: *Deleted

## 2010-11-15 VITALS — BP 123/64 | HR 52 | Ht 72.0 in | Wt 201.8 lb

## 2010-11-15 DIAGNOSIS — I4891 Unspecified atrial fibrillation: Secondary | ICD-10-CM

## 2010-11-15 DIAGNOSIS — Z7901 Long term (current) use of anticoagulants: Secondary | ICD-10-CM

## 2010-11-15 DIAGNOSIS — I472 Ventricular tachycardia: Secondary | ICD-10-CM

## 2010-11-15 DIAGNOSIS — I509 Heart failure, unspecified: Secondary | ICD-10-CM

## 2010-11-15 DIAGNOSIS — I5022 Chronic systolic (congestive) heart failure: Secondary | ICD-10-CM

## 2010-11-15 DIAGNOSIS — I2510 Atherosclerotic heart disease of native coronary artery without angina pectoris: Secondary | ICD-10-CM

## 2010-11-15 NOTE — Assessment & Plan Note (Signed)
He denies any anginal symptoms. We will continue with his current medical therapy.

## 2010-11-15 NOTE — Assessment & Plan Note (Signed)
He has had no significant of defibrillator discharges. He will keep his scheduled appointment in the ICD clinic.

## 2010-11-15 NOTE — Assessment & Plan Note (Addendum)
We will followup on his INR.

## 2010-11-15 NOTE — Progress Notes (Signed)
[name] Date of Birth: 15-Nov-1932   History of Present Illness: [name] is seen back today for a followup visit. He denies any significant shortness of breath or edema. He thinks that his weight has been coming down on his home scales. He takes his Lasix only as needed. He has had no defibrillator discharges. He denies any chest pain.   Current Outpatient Prescriptions on File Prior to Visit  Medication Sig Dispense Refill  . amiodarone (PACERONE) 200 MG tablet Take one tablet (200 mg) in AM and 1/2 tablet (100 mg) in PM  135 tablet  3  . aspirin 81 MG tablet Take 81 mg by mouth daily. 2 DAILY       . calcium carbonate (TUMS - DOSED IN MG ELEMENTAL CALCIUM) 500 MG chewable tablet Chew 1 tablet by mouth as needed.        . carvedilol (COREG) 25 MG tablet Take 25 mg by mouth 2 (two) times daily with a meal. 12.5MG  IN THE AM, AND 25MG  IN THE PM       . fluticasone (FLONASE) 50 MCG/ACT nasal spray 2 sprays by Nasal route daily.        . furosemide (LASIX) 20 MG tablet Take 20 mg by mouth 2 (two) times daily as needed.        Marland Kitchen HYDROcodone-acetaminophen (VICODIN) 5-500 MG per tablet Take 1 tablet by mouth every 6 (six) hours as needed.        Marland Kitchen lisinopril (PRINIVIL,ZESTRIL) 10 MG tablet Take 1 tablet (10 mg total) by mouth 2 (two) times daily.  60 tablet  5  . loratadine (CLARITIN) 10 MG tablet Take 10 mg by mouth daily.        . Mometasone Furo-Formoterol Fum (DULERA IN) Inhale into the lungs. 2 puffs TID       . omega-3 acid ethyl esters (LOVAZA) 1 G capsule Take 1 g by mouth 2 (two) times daily.       . pantoprazole (PROTONIX) 40 MG tablet Take 40 mg by mouth 2 (two) times daily.        Bertram Gala Glycol-Propyl Glycol (SYSTANE OP) Apply to eye as needed.        . potassium chloride (K-DUR) 10 MEQ tablet Take 10 mEq by mouth daily. Take 1/2 tablet daily      . rosuvastatin (CRESTOR) 5 MG tablet Take 5 mg by mouth once a week.        . warfarin (COUMADIN) 5 MG tablet Take 5 mg by  mouth as directed.        Marland Kitchen KLOR-CON M20 20 MEQ tablet TAKE ONE-HALF TABLET BY MOUTH EVERY DAY  45 each  5    Allergies  Allergen Reactions  . Celebrex (Celecoxib) Hives    Gi upset  . Digoxin   . Multaq (Dronedarone Hydrochloride)   . Nexium Hives    Past Medical History  Diagnosis Date  . Dizziness   . SOB (shortness of breath)   . Indigestion   . Difficulty walking   . Chronic back pain   . Atrial fibrillation   . Ischemic cardiomyopathy     WITH CHF  . CHF (congestive heart failure)     EF 35-40%  . Coronary artery disease   . VT (ventricular tachycardia)   . Hypertension   . Dyslipidemia   . Erythrocytosis   . GERD (gastroesophageal reflux disease)     Past Surgical History  Procedure Date  . Cardiac defibrillator  placement   . Coronary artery bypass graft 1983    History  Smoking status  . Former Smoker  . Quit date: 06/23/1976  Smokeless tobacco  . Not on file    History  Alcohol Use No    Family History  Problem Relation Age of Onset  . Heart disease    . Hypertension    . Stroke    . Diabetes      Review of Systems: The review of systems is positive for chronic shortness of breath. No swelling. NO ICD discharges. Activity remains somewhat limited.  No significant bruising. No bleeding reported. He does note increased difficulty caring for his house. He lives alone. He wonders if there is some assistance that would help him with his household chores. He was seen recently by hematology and is on iron therapy. All other systems were reviewed and are negative.  Physical Exam: BP 123/64  Pulse 52  Ht 6' (1.829 m)  Wt 201 lb 12.8 oz (91.536 kg)  BMI 27.37 kg/m2  Patient is pleasant and in no acute distress. He does appear chronically ill. Skin is warm and dry. Color is chronically sallow.  HEENT is unremarkable. Normocephalic/atraumatic. PERRL. Sclera are nonicteric. Neck is supple. No masses. No JVD. Lungs are fairly clear. Cardiac exam shows a  regular rate and rhythm. Abdomen is soft. Extremities are without edema. Gait and ROM are intact. No gross neurologic deficits noted.   LABORATORY DATA:    Assessment / Plan:

## 2010-11-15 NOTE — Patient Instructions (Addendum)
Continue your current medications.  Take Lasix as needed for any weight gain or shortness of breath.  Avoid salt! This is very important.  I will see you again in 3 months. You will receive a card to call and make an app for mid January.

## 2010-11-15 NOTE — Assessment & Plan Note (Addendum)
His last hospitalization was in August. He appears to be doing well with sodium restriction and taking his Lasix as needed. He appears to be euvolemic today. His social situation is getting more difficult with his inability to maintain his home. I suggested an assisted living arrangement would be the best fit for him but he doesn't want to consider this. He is going to [facility] with [facility] and ask if there is a Child psychotherapist that can help plug him into social services.

## 2010-11-16 ENCOUNTER — Encounter (HOSPITAL_BASED_OUTPATIENT_CLINIC_OR_DEPARTMENT_OTHER): Payer: Medicare Other | Admitting: Oncology

## 2010-11-16 DIAGNOSIS — D751 Secondary polycythemia: Secondary | ICD-10-CM

## 2010-11-27 ENCOUNTER — Emergency Department (HOSPITAL_COMMUNITY): Payer: Medicare Other

## 2010-11-27 ENCOUNTER — Inpatient Hospital Stay (HOSPITAL_COMMUNITY)
Admission: EM | Admit: 2010-11-27 | Discharge: 2010-11-30 | DRG: 291 | Disposition: A | Discharge status: Home Health | Payer: Medicare Other | Attending: Internal Medicine | Admitting: Internal Medicine

## 2010-11-27 DIAGNOSIS — K219 Gastro-esophageal reflux disease without esophagitis: Secondary | ICD-10-CM | POA: Diagnosis present

## 2010-11-27 DIAGNOSIS — E7800 Pure hypercholesterolemia, unspecified: Secondary | ICD-10-CM | POA: Diagnosis present

## 2010-11-27 DIAGNOSIS — Z95810 Presence of automatic (implantable) cardiac defibrillator: Secondary | ICD-10-CM

## 2010-11-27 DIAGNOSIS — J449 Chronic obstructive pulmonary disease, unspecified: Secondary | ICD-10-CM | POA: Diagnosis present

## 2010-11-27 DIAGNOSIS — E119 Type 2 diabetes mellitus without complications: Secondary | ICD-10-CM | POA: Diagnosis present

## 2010-11-27 DIAGNOSIS — I5023 Acute on chronic systolic (congestive) heart failure: Principal | ICD-10-CM | POA: Diagnosis present

## 2010-11-27 DIAGNOSIS — J4489 Other specified chronic obstructive pulmonary disease: Secondary | ICD-10-CM | POA: Diagnosis present

## 2010-11-27 DIAGNOSIS — J189 Pneumonia, unspecified organism: Secondary | ICD-10-CM | POA: Diagnosis present

## 2010-11-27 DIAGNOSIS — Z7901 Long term (current) use of anticoagulants: Secondary | ICD-10-CM

## 2010-11-27 DIAGNOSIS — I2589 Other forms of chronic ischemic heart disease: Secondary | ICD-10-CM | POA: Diagnosis present

## 2010-11-27 DIAGNOSIS — I509 Heart failure, unspecified: Secondary | ICD-10-CM | POA: Diagnosis present

## 2010-11-27 DIAGNOSIS — E079 Disorder of thyroid, unspecified: Secondary | ICD-10-CM | POA: Diagnosis present

## 2010-11-27 DIAGNOSIS — E785 Hyperlipidemia, unspecified: Secondary | ICD-10-CM | POA: Diagnosis present

## 2010-11-27 LAB — DIFFERENTIAL
Basophils Absolute: 0 10*3/uL (ref 0.0–0.1)
Basophils Relative: 0 % (ref 0–1)
Lymphocytes Relative: 13 % (ref 12–46)
Monocytes Relative: 24 % — ABNORMAL HIGH (ref 3–12)
Neutro Abs: 4 10*3/uL (ref 1.7–7.7)
Neutrophils Relative %: 62 % (ref 43–77)

## 2010-11-27 LAB — URINALYSIS, ROUTINE W REFLEX MICROSCOPIC
Bilirubin Urine: NEGATIVE
Leukocytes, UA: NEGATIVE
Nitrite: NEGATIVE
Specific Gravity, Urine: 1.018 (ref 1.005–1.030)
Urobilinogen, UA: 0.2 mg/dL (ref 0.0–1.0)
pH: 8 (ref 5.0–8.0)

## 2010-11-27 LAB — COMPREHENSIVE METABOLIC PANEL
ALT: 17 U/L (ref 0–53)
AST: 30 U/L (ref 0–37)
Albumin: 3.6 g/dL (ref 3.5–5.2)
CO2: 27 mEq/L (ref 19–32)
Calcium: 9.2 mg/dL (ref 8.4–10.5)
Creatinine, Ser: 1.21 mg/dL (ref 0.50–1.35)
GFR calc non Af Amer: 57 mL/min — ABNORMAL LOW (ref >90)
Sodium: 143 mEq/L (ref 135–145)

## 2010-11-27 LAB — CBC
Hemoglobin: 11 g/dL — ABNORMAL LOW (ref 13.0–17.0)
MCH: 23.8 pg — ABNORMAL LOW (ref 26.0–34.0)
MCV: 81 fL (ref 78.0–100.0)
RBC: 4.62 MIL/uL (ref 4.22–5.81)
WBC: 6.5 10*3/uL (ref 4.0–10.5)

## 2010-11-27 LAB — PROTIME-INR
INR: 2.53 — ABNORMAL HIGH (ref 0.00–1.49)
Prothrombin Time: 27.7 seconds — ABNORMAL HIGH (ref 11.6–15.2)

## 2010-11-27 LAB — DIGOXIN LEVEL: Digoxin Level: <0.3 ng/mL — ABNORMAL LOW (ref 0.8–2.0)

## 2010-11-27 LAB — OCCULT BLOOD, POC DEVICE: Fecal Occult Bld: NEGATIVE

## 2010-11-27 LAB — PRO B NATRIURETIC PEPTIDE: Pro B Natriuretic peptide (BNP): 1126 pg/mL — ABNORMAL HIGH (ref 0–450)

## 2010-11-27 LAB — CARDIAC PANEL(CRET KIN+CKTOT+MB+TROPI)
CK, MB: 2.2 ng/mL (ref 0.3–4.0)
Total CK: 45 U/L (ref 7–232)

## 2010-11-27 LAB — APTT: aPTT: 45 seconds — ABNORMAL HIGH (ref 24–37)

## 2010-11-27 LAB — POCT I-STAT TROPONIN I: Troponin i, poc: 0 ng/mL (ref 0.00–0.08)

## 2010-11-28 DIAGNOSIS — R0602 Shortness of breath: Secondary | ICD-10-CM

## 2010-11-28 LAB — BASIC METABOLIC PANEL
CO2: 27 mEq/L (ref 19–32)
Calcium: 9.1 mg/dL (ref 8.4–10.5)
Glucose, Bld: 125 mg/dL — ABNORMAL HIGH (ref 70–99)
Potassium: 4.1 mEq/L (ref 3.5–5.1)
Sodium: 142 mEq/L (ref 135–145)

## 2010-11-28 LAB — T4: T4, Total: 11.8 ug/dL (ref 5.0–12.5)

## 2010-11-28 LAB — PRO B NATRIURETIC PEPTIDE: Pro B Natriuretic peptide (BNP): 1356 pg/mL — ABNORMAL HIGH (ref 0–450)

## 2010-11-28 LAB — HEMOGLOBIN A1C
Hgb A1c MFr Bld: 5.7 % — ABNORMAL HIGH (ref <5.7)
Mean Plasma Glucose: 117 mg/dL — ABNORMAL HIGH (ref <117)

## 2010-11-28 LAB — T3 UPTAKE: T3 Uptake Ratio: 48.7 % — ABNORMAL HIGH (ref 22.5–37.0)

## 2010-11-28 NOTE — Consult Note (Signed)
NAMELEVAN, ALOIA NO.:  [id number]  MEDICAL RECORD NO.:  [id number]  LOCATION:                                 FACILITY:  PHYSICIAN:  [name], MD DATE OF BIRTH:  1932/06/25  DATE OF CONSULTATION: DATE OF DISCHARGE:                                CONSULTATION   REQUESTING SERVICE:  [facility] hospitalist team.  PRIMARY CARE PHYSICIAN:  Dr. Chilton Greathouse  PRIMARY CARDIOLOGIST:  [name], M.D.  REASON FOR CONSULTATION:  Possible congestive heart failure.  HISTORY OF PRESENT ILLNESS:  Mr. Borchers is a 77 year old male with coronary artery disease status post prior coronary artery bypass grafting, ischemic cardiomyopathy with an LVEF of 35%-40% with prior ventricular tachycardia status post defibrillator placement (old epicardial system exchanged for Medtronic single-chamber device in 2004), and atrial fibrillation status post cardioversion on chronic Coumadin, hypertension, hyperlipidemia, and gastroesophageal reflux disease.  He was seen recently by Dr. Swaziland in the office on October 15, reporting no chest pain or device discharges at that time, and with relatively stable dyspnea on exertion.  Mr. Mcclimans is now admitted to the hospital complaining of progressive shortness of breath, also cough that has been fairly chronic, recent onset diarrhea.  No fevers or chills, no hemoptysis.  He again denies any chest pain, has had no palpitations or device discharges, and denies frank lower extremity edema or orthopnea.  He has been admitted to the hospitalist service, and is being treated for possible component of congestive heart failure as well as potential early pneumonia, with chest CT showing mild patchy opacity in the left lower lobe as well as moderate right-sided and trace left-sided pleural effusions.  Interval history also includes thyroid evaluation by Dr. Felipa Eth, according to the patient.  Imaging studies have shown evidence of  thyroid nodules, and he did have a thyroid scan done earlier in October showing low 24- hour uptake at 0.8%.  Details of followup are not clear at this time. He is noted to have a low TSH of 0.055 during this hospitalization.  ALLERGIES:  Include: 1. CELEBREX. 2. DIGOXIN. 3. MULTAQ. 4. NEXIUM.  MEDICATIONS:  At present include: 1. Amiodarone 200 mg p.o. q.a.m. and 100 mg p.o. q.p.m. 2. Lovenox 40 mg subcu daily. 3. Lasix 20 mg IV b.i.d. 4. Protonix 40 mg p.o. b.i.d. 5. Crestor 5 mg p.o. daily. At home, he is also taking: 1. Aspirin 81 mg 2 tablets daily. 2. Carvedilol 12.5 mg p.o. q.a.m. and 25 mg p.o. q. p.m. 3. Flonase nasal spray. 4. Lisinopril 10 mg p.o. b.i.d. 5. Claritin 10 mg p.o. daily. 6. Omega-3 supplements 1 g p.o. twice daily. 7. Protonix 40 mg p.o. twice daily. 8. Potassium 10 mEq 1/2 tablet p.o. daily. 9. Coumadin.  PAST MEDICAL HISTORY:  Outlined above.  Additional problems include: 1. Erythrocytosis. 2. Chronic back pain. 3. Difficulty ambulating. 4. Recurrent indigestion.  SOCIAL HISTORY:  Patient is divorced, retired from [employer], site. Has a prior history of tobacco use, quit approximately 35 years ago. Has wine on occasion.  FAMILY HISTORY:  Significant for cardiovascular disease, hypertension, and stroke as well as diabetes mellitus.  REVIEW OF SYSTEMS:  The patient  describes chronic shortness of breath, NYHA class II-III.  Denies any major bleeding problems on Coumadin. Reportedly, lives alone and has had some difficulty with maintaining his household, undergoing thyroid evaluation as discussed above per Dr. Felipa Eth, also reportedly on iron therapy due to anemia.  Otherwise, reviewed negative except as outlined.  PHYSICAL EXAMINATION:  VITAL SIGNS: Temperature is 98.0 degrees, heart rate is in the 50s to 80s, respirations 17, blood pressure is 108/57, ox saturation is 94% on 2 L nasal cannula, weight is recorded at 191 pounds. GENERAL:  This is an overweight elderly male, in no acute distress. HEENT: Conjunctivae and lids are normal.  Oropharynx is clear with moist mucosa. NECK: Supple.  An elevated JVP is evident.  No carotid bruits.  No thyromegaly. LUNGS: Exhibits diminished breath sounds at the bases, right more so than left.  No rales or wheezing.  No egophony. CARDIAC: Reveals regular rate and rhythm.  No S3 or gallop.  Soft apical systolic murmur.  No diastolic murmur.  No pericardial rub. ABDOMEN: Soft, nontender.  Bowel sounds present. EXTREMITIES: Exhibit no significant pitting edema.  Distal pulses are 1+. SKIN: Warm and dry. MUSCULOSKELETAL: No kyphosis is noted. NEUROPSYCHIATRIC: The patient is alert, oriented times 3, and moves all extremities.  Affect grossly appropriate.  LABORATORY DATA:  Hemoglobin is 11.0, hematocrit 37.4, WBC 6.5, platelets 193.  INR is 2.5, sodium 143, potassium 4.2, chloride 107, bicarb 27, glucose 125, BUN 14, creatinine 1.2, AST 30, ALT 17, magnesium 2.3, CK 45, CK-MB 2.2.  Troponin-I less than 0.30.  BNP is 1126.  Digoxin less than 0.30.  Fecal occult negative.  CT scan of the chest shows a mild patchy opacity in left lower lobe with moderate right-sided and trace left-sided pleural effusions, moderate centrilobular emphysematous changes without pulmonary nodules. Bilateral thyroid nodules are described on the left, on the left measuring 4.6 cm.  Twelve-lead electrocardiogram from 27th shows sinus bradycardia at 52 beats per minute with leftward axis and nonspecific ST-T changes.  IMPRESSION: 1. Presentation with shortness of breath, possibly multifactorial,     although some element of volume overload is suspected with     documentation of pleural effusions and increased BNP with baseline     history of ischemic cardiomyopathy.  Acute on chronic systolic     heart failure is being addressed with diuresis at this point;     however, he also has a somewhat vague  pulmonary infiltrate and has     been placed on antibiotics by the hospitalist team with the     possibility of early pneumonia.  He has been afebrile so far and     does not have a leukocytosis.  He otherwise reports compliance with     his medications, although states that he is not particularly     observant of his sodium intake in general.  Otherwise reports a     stable appetite.  He denies any obvious palpitations or device     discharges to suspect contribution from recurrent arrhythmias and     he is in sinus rhythm at this time.  Cardiac markers are     against an acute coronary syndrome. 2. Multivessel coronary artery disease, status post previous bypass     surgery as outlined. 3. History of atrial fibrillation, on Coumadin, status post     cardioversion. 4. History of ventricular tachycardia and ischemic cardiomyopathy,     status post defibrillator placement, Medtronic device in place,     single  chamber.  This is followed by Dr. [name]. 5. Hypertension, blood pressure is presently well controlled. 6. Hyperlipidemia, on statin therapy.  RECOMMENDATIONS:  At this point, would plan to continue baseline outpatient cardiac regimen with the exception of switching to Lasix 40 mg IV daily, following intake and output as well as renal function.  At this point, it is not certain that we will need to pursue any further cardiac studies unless clinical situation changes.  Hopefully, he will improve with a mild to modest diuresis.  Continue to follow telemetry to exclude any intercurrent arrhythmias.  Our service will follow with you.     [name], MD     SGM/MEDQ  D:  11/28/2010  T:  11/28/2010  Job:  [id number]  cc:   Dr. Chilton Greathouse [name], M.D.  Electronically Signed by [name] MD on 11/28/2010 04:14:54 PM

## 2010-11-29 DIAGNOSIS — I5023 Acute on chronic systolic (congestive) heart failure: Secondary | ICD-10-CM

## 2010-11-29 LAB — BASIC METABOLIC PANEL
BUN: 16 mg/dL (ref 6–23)
Chloride: 104 mEq/L (ref 96–112)
GFR calc Af Amer: 65 mL/min — ABNORMAL LOW (ref >90)
GFR calc non Af Amer: 56 mL/min — ABNORMAL LOW (ref >90)
Potassium: 3.7 mEq/L (ref 3.5–5.1)
Sodium: 140 mEq/L (ref 135–145)

## 2010-11-29 LAB — PRO B NATRIURETIC PEPTIDE: Pro B Natriuretic peptide (BNP): 1050 pg/mL — ABNORMAL HIGH (ref 0–450)

## 2010-11-29 LAB — CLOSTRIDIUM DIFFICILE BY PCR: Toxigenic C. Difficile by PCR: NEGATIVE

## 2010-11-30 ENCOUNTER — Inpatient Hospital Stay (HOSPITAL_COMMUNITY): Payer: Medicare Other

## 2010-11-30 LAB — BASIC METABOLIC PANEL
Chloride: 101 mEq/L (ref 96–112)
Creatinine, Ser: 1.12 mg/dL (ref 0.50–1.35)
GFR calc Af Amer: 72 mL/min — ABNORMAL LOW (ref >90)
Sodium: 141 mEq/L (ref 135–145)

## 2010-11-30 LAB — CBC
MCV: 81.6 fL (ref 78.0–100.0)
Platelets: 224 10*3/uL (ref 150–400)
RDW: 23 % — ABNORMAL HIGH (ref 11.5–15.5)
WBC: 7.2 10*3/uL (ref 4.0–10.5)

## 2010-11-30 NOTE — H&P (Signed)
NAMEALHASSAN, [name] NO.:  [id number]  MEDICAL RECORD NO.:  [id number]  LOCATION:  [facility]                         FACILITY:  [facility]  PHYSICIAN:  Candelaria Celeste, DO      DATE OF BIRTH:  Jul 24, 1932  DATE OF ADMISSION:  11/27/2010 DATE OF DISCHARGE:                             HISTORY & PHYSICAL   PRIMARY CARE PROVIDER:  [name], MD  CHIEF COMPLAINT:  Dyspnea.  HISTORY OF PRESENT ILLNESS:  Mr. Deems is a 77 year old male with a history of left-sided CHF with last recorded LVEF of 35% to 40% in 2011, COPD, arrhythmia controlled on amiodarone with a 2-week history of worsening dyspnea on exertion to approximately 10-20 m and 20-pound weight loss over the past 2 months.  The patient states that it has been increasingly worse over the past couple of days.  He denies dry cough. Dyspnea is worse on exertion and better with rest.  There is nothing else that improves his shortness of breath.  He denies wheezing, cough, fevers, chills, nausea, or vomiting.  He has been trying to lose some weight, but relates that most of his weight loss is due to lack of appetite.  PAST MEDICAL HISTORY: 1. Systolic CHF with an EF of 35% to 40%. 2. GERD. 3. COPD. 4. Hypertension. 5. Myocardial infarction x1.  PAST SURGICAL HISTORY: 1. The patient has an AICD. 2. Right knee scope. 3. Cholecystectomy.  FAMILY HISTORY:  Not known as the patient is an orphan.  SOCIAL HISTORY:  The patient has a 40 pack-year history of smoking. Denies alcohol use.  MEDICATIONS: 1. Crestor 5 mg weekly. 2. Carvedilol 25 mg half tab in the morning and 1 tablet in the     evening. 3. Lasix 20 mg as needed for increasing edema. 4. Lisinopril 20 mg daily. 5. Pacerone (amiodarone) 200 mg 1 every morning and half tablet in the     evening. 6. Protonix 40 mg b.i.d. 7. Lovaza 1 b.i.d. 8. Warfarin. 9. Aspirin 81 mg. 10.Vicodin 5/325 mg every 6 hours as needed.  DRUG ALLERGIES:  The patient relates  allergies to ESOMEPRAZOLE, CELECOXIB, DIGOXIN, and DRONEDARONE.  REVIEW OF SYSTEMS:  The patient does admit to some loose stools, but otherwise as stated in the HPI.  PHYSICAL EXAMINATION:  VITALS:  Temperature is 97.3, heart rate is 54, blood pressure is 101/57, respiratory rate is 16, oxygen saturation 99% on 2 L. GENERAL:  This is an elderly Caucasian male who is awake, alert, and oriented x3 in no acute distress. HEENT:  Head is normocephalic, atraumatic.  Pupils equal, round, and reactive to light.  Extraocular muscles intact.  Sclerae are anicteric. Tympanic membranes are translucent reflecting good cone of light. NECK:  Supple and without lymphadenopathy.  JVD is to approximately 6-7 cm. LUNGS:  Mild rales bilateral bases with no wheezing noted. CARDIAC:  Bradycardic with no murmurs auscultated. ABDOMEN:  Soft, nontender, nondistended with appropriate bowel sounds. EXTREMITIES:  Mild 1+ pitting edema to the knee with warm extremities and 2+ dorsalis pedis radial pulses noted. SKIN:  There is no rashes, bruises, or petechiae. NEUROLOGIC:  Strength is 5/5 in upper and lower extremities bilaterally with no focal neurological  deficit observed.  LABORATORY DATA: 1. CBC was normal. 2. CMET showed a creatinine of 1.21. 3. Troponin-I was negative. 4. ProBNP was 1126. 5. Chest x-ray showed borderline cardiomegaly with pleural effusions,     right greater than left and no significant pulmonary vascular     congestion. 6. EKG showed a rate of 52 with a left axis deviation with no acute ST     elevations or depressions.  IMPRESSION: 1. Dyspnea. 2. Weight loss. 3. Arrhythmia. 4. Systolic heart failure. 5. Hypertension. 6. Chronic obstructive pulmonary disease.  PLAN:  The exact etiology of the patient's dyspnea is uncertain at this time.  Certainly the patient has some physical exam evidence of mild heart failure as well as laboratory evidence of elevated proBNP. However, the  patient does have a significant smoking history and with weight loss is at risk of having a neoplastic process as an etiology of the patient's dyspnea.  Also on the differential diagnosis would be pulmonary complications resulting from amiodarone.  We will admit the patient to telemetry and give the patient Lasix for diuresis as the patient does have some physical exam findings of congestive heart failure exacerbation.  I will also obtain a chest CT to rule out any neoplastic process.  We will continue the patient's home medications of carvedilol, lisinopril, and amiodarone.  The patient is a full code and we will start enoxaparin for DVT prophylaxis.          ______________________________ Candelaria Celeste, DO     JS/MEDQ  D:  11/27/2010  T:  11/27/2010  Job:  [id number]  cc:   [name], M.D.  Electronically Signed by Candelaria Celeste DO on 11/30/2010 03:14:35 PM

## 2010-12-01 ENCOUNTER — Telehealth: Payer: Self-pay | Admitting: Cardiology

## 2010-12-01 ENCOUNTER — Telehealth: Payer: Self-pay | Admitting: *Deleted

## 2010-12-01 NOTE — Telephone Encounter (Signed)
Spoke with [name] nurse gave orders to check Pts INR on 12/03/10 due to being D/C home from [place] on 11/30/10 on Avelox for 7 days. Pt received no coumadin during his hospital stay, was there from 27th-30th.

## 2010-12-01 NOTE — Telephone Encounter (Signed)
Called stating he was d/c from hospital yesterday. Was given to Rx Avelox 400 mg;Guaisentsin and wanted [place] to send Rx into [facility] because he lost them. After reviewing his d/c summary advised he needs to call Dr. Vicente Males office to get refills since Dr. [place] didn't prescribe. He will call their office.

## 2010-12-01 NOTE — Telephone Encounter (Signed)
Pt calling stating that he was recently d/c from hospital and has lost his RX's. Pt wrote down the names and doses and needs for [place] to call in the following RX's:  avelox 400 mg take for 7 days guaisentsin 600 mg    Please call RX's into [facility] on [address]

## 2010-12-02 ENCOUNTER — Other Ambulatory Visit (HOSPITAL_COMMUNITY): Payer: Self-pay | Admitting: Oncology

## 2010-12-02 ENCOUNTER — Ambulatory Visit (INDEPENDENT_AMBULATORY_CARE_PROVIDER_SITE_OTHER): Payer: Medicare Other | Admitting: *Deleted

## 2010-12-02 ENCOUNTER — Encounter (HOSPITAL_BASED_OUTPATIENT_CLINIC_OR_DEPARTMENT_OTHER): Payer: Medicare Other | Admitting: Oncology

## 2010-12-02 ENCOUNTER — Encounter: Payer: Self-pay | Admitting: Internal Medicine

## 2010-12-02 ENCOUNTER — Other Ambulatory Visit: Payer: Self-pay | Admitting: Internal Medicine

## 2010-12-02 DIAGNOSIS — D751 Secondary polycythemia: Secondary | ICD-10-CM

## 2010-12-02 DIAGNOSIS — I4891 Unspecified atrial fibrillation: Secondary | ICD-10-CM

## 2010-12-02 DIAGNOSIS — I472 Ventricular tachycardia: Secondary | ICD-10-CM

## 2010-12-02 DIAGNOSIS — Z95810 Presence of automatic (implantable) cardiac defibrillator: Secondary | ICD-10-CM

## 2010-12-02 DIAGNOSIS — Z7901 Long term (current) use of anticoagulants: Secondary | ICD-10-CM

## 2010-12-02 LAB — CBC WITH DIFFERENTIAL/PLATELET
BASO%: 0.3 % (ref 0.0–2.0)
HCT: 39.9 % (ref 38.4–49.9)
MCHC: 29.8 g/dL — ABNORMAL LOW (ref 32.0–36.0)
MONO#: 1.7 10*3/uL — ABNORMAL HIGH (ref 0.1–0.9)
NEUT#: 3.9 10*3/uL (ref 1.5–6.5)
RBC: 4.92 10*6/uL (ref 4.20–5.82)
WBC: 6.8 10*3/uL (ref 4.0–10.3)
lymph#: 1 10*3/uL (ref 0.9–3.3)

## 2010-12-02 LAB — FERRITIN: Ferritin: 179 ng/mL (ref 22–322)

## 2010-12-02 NOTE — Discharge Summary (Signed)
[name], [name] NO.:  [id number]  MEDICAL RECORD NO.:  [id number]  LOCATION:  4703                         FACILITY:  [facility]  PHYSICIAN:  Isidor Holts, M.D.  DATE OF BIRTH:  [birth year]  DATE OF ADMISSION:  11/27/2010 DATE OF DISCHARGE:  11/30/2010                              DISCHARGE SUMMARY   PRIMARY PHYSICIAN:  Joshua Earthly, MD  PRIMARY CARDIOLOGIST:  Joshua M. Sw[place], MD  DISCHARGE DIAGNOSES: 1. Left lower lobe community-acquired pneumonia. 2. History of ischemic cardiomyopathy/chronic systolic congestive     heart failure, ejection fraction 34% to 40%, status post AICD. 3. chronic obstructive pulmonary disease. 4. Dyspnea, multifactorial secondary to above. 5. History of coronary artery disease status post CABG in 1983, status     post PCI/stent 2004. 6. History of pulmonary artery stenosis. 7. History of monomorphic ventricular tachycardia. 8. History of polycythemia treated with phlebotomy. 9. Gastroesophageal reflux disease. 10.Type 2 diabetes mellitus. 11.Dyslipidemia/hypertriglyceridemia. 12.History of acute thyroiditis(T131 scan November 09, 2010, showed 0.8%     uptake). 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 14.Chronic anticoagulation.  DISCHARGE MEDICATIONS: 1. Avelox 400 mg p.o. b.i.d. for 7 days. 2. Mucinex 600 mg p.o. b.i.d. for 7 days. 3. Coreg 12.5 mg p.o. b.i.d. 4. Furosemide 40 mg p.o. b.i.d. 5. Amiodarone 200 mg p.o. q.a.m. and 100 mg p.o. at bedtime. 6. Aspirin enteric-coated 162 mg p.o. daily. 7. Crestor 5 mg p.o. weekly. 8. Dulera(5/100) 2 puffs b.i.d. 9. Hydrocodone/APAP (5/325) 1 tablet p.o. p.r.n. q.4 h. for arthritis     pain. 10.Klor-Con 10 mEq p.o. daily. 11.Lisinopril 20 mg p.o. daily. 12.Loratadine 10 mg p.o. daily. 13.Lovaza 1 g p.o. b.i.d. 14.Metamucil 1 packet p.o. daily. 15.MiraLAX 17 g p.o. daily. 16.Protonix 40 mg p.o. b.i.d. 17.Warfarin per INR, currently on 2.5 mg p.o. at 6:00 p.m.  daily. 18.Probiotic OTC 1 tab p.o. daily. 19.Refresh eye drops 1-2 drops each eye p.r.n. for dry eyes daily.  PROCEDURES: 1. Chest x-ray November 27, 2010.  This showed borderline cardiomegaly,     right greater than left pleural effusion.  Similar to prior     studies.  Mild associated atelectasis.  No significant pulmonary     congestion or edema to suggest failure. 2. Chest CT scan November 27, 2010.  This showed mild patchy opacity in     the left lower lobe, possibly infectious.  There was moderate right     ventricular pleural effusions with associated lower lobe     atelectasis.  Moderate centrilobular emphysematous changes.  No     suspicious pulmonary nodules.  Bilateral thyroid nodules, measuring     4-6 cm on the left. 3. Chest x-ray November 30, 2010.  This showed stable chest radiograph     findings, basilar right pleural effusion and basilar densities.     There are few densities at the left lung base which are unchanged.  CONSULTATION:  [name], cardiologist.  ADMISSION HISTORY:  As in H and P notes of November 27, 2010, dictated by [name].  However, in brief, this is a 77 year old male, with rather complex cardiac history, including coronary artery disease status post CABG in 1983, status post PCI/stent in  2004, ischemic cardiomyopathy/chronic systolic congestive heart failure, ejection fraction 34% to 40%, status post AICD, history of pulmonary artery stenosis, monomorphic ventricular tachycardia, polycythemia treated with phlebotomy, GERD, type 2 diabetes mellitus, dyslipidemia/hypertriglyceridemia, history of recent acute thyroiditis confirmed by T131 scan on November 09, 2010, chronic anticoagulation, COPD, presenting with progressive shortness of breath of approximately 2 week's duration, worse in the last couple of days, particularly on exertion.  He was admitted for further evaluation, investigation, and management.  CLINICAL COURSE: 1. Left  lower lobe community-acquired pneumonia.  The patient presents     as described above.  Initial chest x-ray demonstrated no acute     findings.  However, chest CT scan done to evaluate complaints of     weight loss, demonstrated mild patchy opacity in the left lower     lobe.  The patient was managed for community-acquired pneumonia,     with a combination of intravenous Rocephin and azithromycin, with     satisfactory clinical improvement and by November 30, 2010, patient     was no longer short of breath and felt considerably better.  2. History of chronic systolic congestive heart failure.  The patient     has a known history of ischemic cardiomyopathy with ejection     fraction 34%-40% and as a matter of fact, is status post AICD.       He had no clinical evidence of CHF decompensation during this hospitalization.     Cardiology consultation was kindly provided by Dr. [name],     who helped rationalize patient's anti-failure and other cardiac     medications.  He will follow up with his primary cardiologist, on     discharge.  He had no evidence of acute coronary syndrome during     this hospitalization.  3. History of COPD.  The patient was managed with bronchodilator and     nebulizers.  4. Dyspnea.  This was multifactorial secondary to community-acquired     pneumonia, superimposed on COPD and cardiac dysfunction.     As described above, the patient responded to above management     measures.  5. History of polycythemia.  This was treated in the past, with     phlebotomy, and we are pleased to note that hemoglobin as of     November 27, 2010, was only 11 with a hematocrit of 37.4.  6. GERD.  There were no problems referable to this.  The patient was     managed with proton pump inhibitor.  7. History of acute thyroiditis/dysthyroidism.  Patient's thyroid     tests demonstrate TSH of 0.055, free T4 11.8, T3 uptake 40.7.     These findings appear consistent with  dysthyroidism, unlikely     secondary to amiodarone therapy and will continue to be followed up by the     patient's primary MD.  8. Type 2 diabetes mellitus.  This appears to be diet controlled.  The     patient was euglycemic throughout his hospitalization and     hemoglobin A1c was normal at 5.7.  DISPOSITION:  The patient was on November 30, 2010, asymptomatic and  very keen to go home.  He was cleared by Cardiology team for discharge with outpatient followup.  He was therefore discharged accordingly.  ACTIVITY:  As tolerated.  Recommended to increase activity slowly.  DIET:  Heart healthy.  FOLLOWUP HOME INSTRUCTIONS:  The patient will follow up with his primary MD, Dr. Larina [name], routinely, per  prior scheduled appointment.  He will follow up with his primary cardiologist, Dr. Peter M. [name] in 3 weeks. The Cardiology Office has kindly offered to contact the patient to schedule an appointment.    SPECIAL INSTRUCTIONS:  Home health RN has been arranged, for heart failure management.  Also, the patient was instructed to present to [facility] in a.m. on Friday December 03, 2010, for PT/INR check.  Further changes of Coumadin dosage if indicated, as well as timing of further PT/INR checks, will be deferred to the Coumadin Clinic.  All this has been communicated to the patient, he has verbalized understanding.     Isidor Holts, M.D.     CO/MEDQ  D:  11/30/2010  T:  11/30/2010  Job:  [id number]  cc:   [name], M.D. [name], M.D.  Electronically Signed by Isidor Holts M.D. on 12/02/2010 07:55:20 AM

## 2010-12-03 ENCOUNTER — Ambulatory Visit (INDEPENDENT_AMBULATORY_CARE_PROVIDER_SITE_OTHER): Payer: Medicare Other | Admitting: *Deleted

## 2010-12-03 DIAGNOSIS — I4891 Unspecified atrial fibrillation: Secondary | ICD-10-CM

## 2010-12-07 ENCOUNTER — Other Ambulatory Visit: Payer: Self-pay | Admitting: Internal Medicine

## 2010-12-07 DIAGNOSIS — E042 Nontoxic multinodular goiter: Secondary | ICD-10-CM

## 2010-12-08 ENCOUNTER — Telehealth: Payer: Self-pay | Admitting: Emergency Medicine

## 2010-12-08 ENCOUNTER — Ambulatory Visit (INDEPENDENT_AMBULATORY_CARE_PROVIDER_SITE_OTHER): Payer: Self-pay | Admitting: Cardiology

## 2010-12-08 ENCOUNTER — Telehealth: Payer: Self-pay | Admitting: *Deleted

## 2010-12-08 DIAGNOSIS — I4891 Unspecified atrial fibrillation: Secondary | ICD-10-CM

## 2010-12-08 DIAGNOSIS — R0989 Other specified symptoms and signs involving the circulatory and respiratory systems: Secondary | ICD-10-CM

## 2010-12-08 LAB — POCT INR: INR: 1.8

## 2010-12-08 NOTE — Telephone Encounter (Signed)
Spoke w/Tammy and advised that he could stop Coumadin 5 days prior to Bx per Dr. [place]. Has an app here to get INR on 11/14.

## 2010-12-08 NOTE — Telephone Encounter (Signed)
Okay to stop coumadin 5 days before thyroid biopsy. [name]

## 2010-12-08 NOTE — Telephone Encounter (Signed)
Tammy called from [facility] and states pt need Thyroid Bx, Pending on 12/15/10. Pt needs to be off coumadin  for 4 days. Please advise Tammy with clearance at [phone] and fax [phone].

## 2010-12-08 NOTE — Telephone Encounter (Signed)
Spoke w/ Tiffany and she will LM for Dr Sw[place] to ok pt to stop coumadin x4d for thyroid bx scheduled on 12-15-10.  Pt will need to have INR ck'd same day as procedure as well.   11:40AM Spoke w/ Synetta Fail, RN and Dr. [place] ok'd for pt to stop coumadin x4days and he will have his INR ck'd at 8:45am on 12-15-10.

## 2010-12-08 NOTE — Telephone Encounter (Signed)
Confirmed with patient to stop coumadin on 12-11-10 and to be at the coumadin clinic for INR at 845am on the 12-15-10

## 2010-12-08 NOTE — Telephone Encounter (Signed)
[name] is having thyroid Bx next Wed 11/14; they want him to stop Coumadin 5 days prior which would be Sat. Please advise

## 2010-12-15 ENCOUNTER — Other Ambulatory Visit (HOSPITAL_COMMUNITY)
Admission: RE | Admit: 2010-12-15 | Discharge: 2010-12-15 | Disposition: A | Discharge status: Home / Self Care | Payer: Medicare Other | Source: Ambulatory Visit | Attending: Interventional Radiology | Admitting: Interventional Radiology

## 2010-12-15 ENCOUNTER — Ambulatory Visit
Admission: RE | Admit: 2010-12-15 | Discharge: 2010-12-15 | Disposition: A | Discharge status: Home / Self Care | Payer: Medicare Other | Source: Ambulatory Visit | Attending: Internal Medicine | Admitting: Internal Medicine

## 2010-12-15 ENCOUNTER — Ambulatory Visit (INDEPENDENT_AMBULATORY_CARE_PROVIDER_SITE_OTHER): Payer: Medicare Other | Admitting: *Deleted

## 2010-12-15 DIAGNOSIS — I4891 Unspecified atrial fibrillation: Secondary | ICD-10-CM

## 2010-12-15 DIAGNOSIS — E049 Nontoxic goiter, unspecified: Secondary | ICD-10-CM | POA: Insufficient documentation

## 2010-12-15 DIAGNOSIS — E042 Nontoxic multinodular goiter: Secondary | ICD-10-CM

## 2010-12-15 LAB — POCT INR: INR: 1.6

## 2010-12-15 NOTE — Patient Instructions (Signed)
Talked with Tammy at [facility] and confirmed appointment today for thyroid biopsy, INR 1.6, patient states he did not stop his coumadin. It was confirmed with their MD that they will proceed with biopsy due to INR 1.6. This nurse called [facility] and confirmed they have order for next INR to be checked on 12/22/2010, spoke with [facility].

## 2010-12-17 NOTE — Progress Notes (Signed)
icd remote check  

## 2010-12-22 ENCOUNTER — Ambulatory Visit (INDEPENDENT_AMBULATORY_CARE_PROVIDER_SITE_OTHER): Payer: Self-pay | Admitting: Cardiovascular Disease

## 2010-12-22 ENCOUNTER — Encounter: Payer: Self-pay | Admitting: *Deleted

## 2010-12-22 DIAGNOSIS — R0989 Other specified symptoms and signs involving the circulatory and respiratory systems: Secondary | ICD-10-CM

## 2010-12-22 DIAGNOSIS — I4891 Unspecified atrial fibrillation: Secondary | ICD-10-CM

## 2010-12-27 ENCOUNTER — Encounter: Payer: Medicare Other | Admitting: *Deleted

## 2010-12-28 ENCOUNTER — Telehealth: Payer: Self-pay | Admitting: Oncology

## 2010-12-28 NOTE — Telephone Encounter (Signed)
S/w pt re appt for 11/28 @ 1:30 pm.

## 2010-12-29 ENCOUNTER — Ambulatory Visit (HOSPITAL_BASED_OUTPATIENT_CLINIC_OR_DEPARTMENT_OTHER): Payer: Medicare Other | Admitting: Oncology

## 2010-12-29 ENCOUNTER — Other Ambulatory Visit (HOSPITAL_BASED_OUTPATIENT_CLINIC_OR_DEPARTMENT_OTHER): Payer: Medicare Other | Admitting: Lab

## 2010-12-29 ENCOUNTER — Other Ambulatory Visit (HOSPITAL_COMMUNITY): Payer: Self-pay | Admitting: Oncology

## 2010-12-29 ENCOUNTER — Telehealth: Payer: Self-pay | Admitting: Oncology

## 2010-12-29 VITALS — BP 114/56 | HR 57 | Temp 96.8°F | Ht 72.0 in | Wt 182.8 lb

## 2010-12-29 DIAGNOSIS — D509 Iron deficiency anemia, unspecified: Secondary | ICD-10-CM

## 2010-12-29 DIAGNOSIS — Z7901 Long term (current) use of anticoagulants: Secondary | ICD-10-CM

## 2010-12-29 DIAGNOSIS — R634 Abnormal weight loss: Secondary | ICD-10-CM

## 2010-12-29 DIAGNOSIS — Z8679 Personal history of other diseases of the circulatory system: Secondary | ICD-10-CM

## 2010-12-29 DIAGNOSIS — D649 Anemia, unspecified: Secondary | ICD-10-CM

## 2010-12-29 LAB — COMPREHENSIVE METABOLIC PANEL
AST: 34 U/L (ref 0–37)
Albumin: 4.3 g/dL (ref 3.5–5.2)
Alkaline Phosphatase: 82 U/L (ref 39–117)
BUN: 31 mg/dL — ABNORMAL HIGH (ref 6–23)
Potassium: 4.8 mEq/L (ref 3.5–5.3)
Sodium: 142 mEq/L (ref 135–145)
Total Protein: 6.9 g/dL (ref 6.0–8.3)

## 2010-12-29 LAB — CBC WITH DIFFERENTIAL/PLATELET
BASO%: 1 % (ref 0.0–2.0)
EOS%: 2 % (ref 0.0–7.0)
MCH: 25.8 pg — ABNORMAL LOW (ref 27.2–33.4)
MCHC: 31.9 g/dL — ABNORMAL LOW (ref 32.0–36.0)
MCV: 81.1 fL (ref 79.3–98.0)
MONO%: 17.7 % — ABNORMAL HIGH (ref 0.0–14.0)
RBC: 4.47 10*6/uL (ref 4.20–5.82)
RDW: 23.7 % — ABNORMAL HIGH (ref 11.0–14.6)

## 2010-12-29 LAB — IRON AND TIBC
%SAT: 17 % — ABNORMAL LOW (ref 20–55)
Iron: 59 ug/dL (ref 42–165)

## 2010-12-29 LAB — FERRITIN: Ferritin: 92 ng/mL (ref 22–322)

## 2010-12-29 NOTE — Progress Notes (Signed)
This office note has been dictated.  #[id number]

## 2010-12-29 NOTE — Telephone Encounter (Signed)
gve the pt his jan 2013 appt calendar °

## 2010-12-30 ENCOUNTER — Other Ambulatory Visit: Payer: Medicare Other | Admitting: Lab

## 2010-12-30 ENCOUNTER — Ambulatory Visit: Payer: Medicare Other | Admitting: Physician Assistant

## 2010-12-30 NOTE — Progress Notes (Signed)
CC:   [name], M.D. [name], M.D.  HISTORY:  I saw [name] today for follow-up of his more recent iron deficiency anemia.  In the past, we had followed Mr. Manninen for a relative erythrocytosis.  In November 1996, Mr. Forton's hemoglobin was 20, and his hematocrit was 57.1.  Workup carried out more than 10 years ago disclosed findings that suggested a relative erythrocytosis.  JAK2 mutation was not detected.  The patient's last phlebotomy appears to have been in January 2008.  Mr. Callow was last seen by [place] on 11/05/2010.  Following that visit, Mr. Andreoni received intravenous Feraheme 510 mg because of low ferritin levels.  The patient had received a dose of Feraheme 255 mg on 09/20.  Ferritin at that time was 17.  Following those IV iron infusions, the patient's hemoglobin and hematocrit have improved.  We had been concerned about the possibility of GI bleeding.  Stools back in October 2011 and again in late August of this year apparently were negative.  The patient denies any obvious blood in his stools.  I am not sure when his last GI workup was nor is the patient.  He has seen Dr. Jeani Hawking in the past.  He is on Coumadin for atrial fibrillation.  The patient was admitted to the hospital from October 27th through October 30th.  His discharge diagnosis indicates that he had a left lower lobe community-acquired pneumonia.  The patient also has a history of ischemic cardiomyopathy and chronic systolic congestive heart failure with an ejection fraction of 34% to 40%.  Other information from that admission includes a chest CT scan carried out without IV contrast carried out on 11/27/2010.  There was patchy opacity in the left lower lobe felt to be possibly infectious.  The patient had a moderate right and a trace left pleural effusions.  There were also emphysematous changes but no suspicious pulmonary nodules.  He was noted to have bilateral thyroid nodules  measuring up to 4.6 cm on the left.  The patient apparently underwent a fine needle aspiration of the left thyroid gland.  The pathology report indicates rare follicular epithelial cells and hemosiderin-laden macrophages.  These findings were benign.  The biopsy was carried out on 12/15/2010.  Mr. Naumann is really without any major complaints today.  It is apparent that he has lost a considerable amount of weight, which I have asked him about.  He says he is eating okay and really is not trying to lose weight.  Nevertheless, he has lost about 20 pounds over the past 2 months and apparently another 20 pounds before that dating back about a year ago.  As stated, the patient is without any new complaints today.  MEDICAL PROBLEMS: 1. History of ischemic cardiomyopathy/chronic systolic congestive     heart failure with an ejection fraction of 34% to 40%. 2. The patient has ICD defibrillator unit. 3. Chronic obstructive pulmonary disease. 4. Coronary artery disease status post CABG in 1983 and PCI/stent,     2004. 5. History of ventricular tachycardia. 6. History of erythrocytosis treated in the past with phlebotomy. 7. Current history of iron deficiency state. 8. GERD. 9. Diabetes mellitus, type 2. 10.Dyslipidemia. 11.Thyroid nodules on CT with a history of acute thyroiditis. 12.Chronic anticoagulation therapy with Coumadin.  MEDICINES:  Amiodarone, aspirin, calcium carbonate, Coreg, Flonase nasal spray, Lasix, Klor-Con, Vicodin as needed, lisinopril, Claritin, omega-3 fatty acids, Protonix, Crestor, Coumadin.  PHYSICAL EXAMINATION:  General Appearance:  Mr. Shock shows little change.  He is 77 years old but looks older and more frail than his stated age, somewhat debilitated.  Vital Signs:  Weight today is 182 pounds, height 6 feet even, body surface area 2.0 sq m.  Blood pressure 114/56, pulse 57, respirations regular and unlabored.  HEENT:  There was no scleral icterus.  Mouth  and pharynx are benign.  No peripheral adenopathy palpable.  Lungs are clear to percussion and auscultation. Cardiac Exam:  Irregular rhythm without murmur or rub.  The patient has a defibrillator unit in the left mid abdomen.  Abdomen is notable for possible palpable liver descending below the right costal margin in the anterior axillary line.  Extremities:  Legs are a little puffy without actual pitting edema today.  The patient has purpura.  It should be noted that the CT scan of the chest carried out without contrast stated that the visualized upper abdomen was unremarkable.  LABORATORY DATA:  Today, white count 8.3.  ANC 5.3.  Hemoglobin 11.6, hematocrit 36.2, MCV 81.1, MCH 25.8.  Chemistries today are currently pending.  Chemistries on 11/27/2010 notable for a BUN of 14, creatinine 1.21, albumin 3.6.  Ferritin on 11/05/2010 was 43.  Iron 13, TIBC 382, iron saturation 3%.  Cardiac enzymes from 10/27 were normal.  Iron studies today are pending.  We have a ferritin from 12/02/2010 that was 179.  Chemistries from today notable for a BUN of 31, creatinine 1.23, an albumin of 4.3.  IMPRESSION AND PLAN:  Mr. Maiorino hemoglobin and hematocrit have improved from the last time he was here on 11/05/2010 when his hemoglobin was 10.2, hematocrit 34.6, currently 11.6 and 36.2 with improvement in his MCV and MCH following intravenous iron, 510 mg of Feraheme on 11/16/2010.  I am most concerned about the patient's degree of weight loss, another 20 pounds over the past 2 months.  Some of this may be attributable to his hospitalization and illness which occurred about a month ago.  We will plan to see Mr. Ferber again in 2 months at which time we will check CBC, chemistries and iron studies.    ______________________________ Samul Dada, M.D. DSM/MEDQ  D:  12/29/2010  T:  12/30/2010  Job:  [id number]

## 2011-01-03 ENCOUNTER — Ambulatory Visit (INDEPENDENT_AMBULATORY_CARE_PROVIDER_SITE_OTHER): Payer: Self-pay | Admitting: Internal Medicine

## 2011-01-03 DIAGNOSIS — R0989 Other specified symptoms and signs involving the circulatory and respiratory systems: Secondary | ICD-10-CM

## 2011-01-03 DIAGNOSIS — I4891 Unspecified atrial fibrillation: Secondary | ICD-10-CM

## 2011-01-04 ENCOUNTER — Ambulatory Visit (INDEPENDENT_AMBULATORY_CARE_PROVIDER_SITE_OTHER): Payer: Medicare Other | Admitting: Internal Medicine

## 2011-01-04 ENCOUNTER — Encounter: Payer: Medicare Other | Admitting: *Deleted

## 2011-01-04 VITALS — BP 102/52 | HR 54 | Ht 72.0 in | Wt 185.8 lb

## 2011-01-04 DIAGNOSIS — I472 Ventricular tachycardia, unspecified: Secondary | ICD-10-CM

## 2011-01-04 DIAGNOSIS — I5022 Chronic systolic (congestive) heart failure: Secondary | ICD-10-CM

## 2011-01-04 DIAGNOSIS — Z95810 Presence of automatic (implantable) cardiac defibrillator: Secondary | ICD-10-CM

## 2011-01-04 DIAGNOSIS — I4891 Unspecified atrial fibrillation: Secondary | ICD-10-CM

## 2011-01-04 LAB — ICD DEVICE OBSERVATION
BATTERY VOLTAGE: 2.64 V
DEV-0020ICD: NEGATIVE
PACEART VT: 0
RV LEAD AMPLITUDE: 6.7 mv
TZAT-0004FASTVT: 8
TZAT-0005FASTVT: 88 pct
TZAT-0011SLOWVT: 10 ms
TZAT-0011SLOWVT: 10 ms
TZAT-0012FASTVT: 200 ms
TZAT-0012SLOWVT: 200 ms
TZAT-0012SLOWVT: 200 ms
TZAT-0013FASTVT: 1
TZAT-0019SLOWVT: 8 V
TZAT-0019SLOWVT: 8 V
TZON-0003FASTVT: 240 ms
TZON-0003SLOWVT: 350 ms
TZON-0005SLOWVT: 16
TZON-0008SLOWVT: 0 ms
TZON-0011AFLUTTER: 70
TZST-0001FASTVT: 3
TZST-0001FASTVT: 5
TZST-0001SLOWVT: 4
TZST-0001SLOWVT: 6
TZST-0003FASTVT: 30 J
TZST-0003FASTVT: 30 J
TZST-0003FASTVT: 30 J
TZST-0003SLOWVT: 20 J
TZST-0003SLOWVT: 30 J
VENTRICULAR PACING ICD: 0 pct

## 2011-01-04 NOTE — Assessment & Plan Note (Signed)
His current symptoms appear to be class II. He will continue his current medical therapy and maintain a low-sodium diet. I discussed the importance of regular daily exercise.

## 2011-01-04 NOTE — Assessment & Plan Note (Signed)
His device continues to work well. He is approaching elective replacement. When he reaches elective replacement, will decide on whether changing out his generator versus installing a new system in the left chest is most appropriate.

## 2011-01-04 NOTE — Patient Instructions (Signed)
Your physician wants you to follow-up in: 6 months with Dr Court Joy will receive a reminder letter in the mail two months in advance. If you don't receive a letter, please call our office to schedule the follow-up appointment.   Remote monitoring is used to monitor your Pacemaker of ICD from home. This monitoring reduces the number of office visits required to check your device to one time per year. It allows [place] to keep an eye on the functioning of your device to ensure it is working properly. You are scheduled for a device check from home on 04/07/2011. You may send your transmission at any time that day. If you have a wireless device, the transmission will be sent automatically. After your physician reviews your transmission, you will receive a postcard with your next transmission date.  Your physician has recommended you make the following change in your medication:  1) decrease Amiodarone to one tablet daily

## 2011-01-04 NOTE — Assessment & Plan Note (Signed)
He has had no recurrent ventricular arrhythmias. Today I have recommended that he reduce his dose of amiodarone to 200 mg daily.

## 2011-01-04 NOTE — Assessment & Plan Note (Signed)
He appears to be maintaining sinus rhythm. He will continue his current medical therapy. 

## 2011-01-04 NOTE — Progress Notes (Signed)
HPI [name] returns today for followup. He is a 77 year old man with a history of ventricular tachycardia, and ischemic cardiomyopathy, and chronic systolic heart failure. He is status post ICD implantation, initially in 1990. He continues to do well. He has class II congestive heart failure symptoms. He has not had any ICD shocks, or any problems with syncope. He denies chest pain, shortness of breath, or peripheral edema. Allergies  Allergen Reactions  . Celebrex (Celecoxib) Hives    Gi upset  . Digoxin   . Multaq (Dronedarone Hydrochloride)   . Nexium Hives     Current Outpatient Prescriptions  Medication Sig Dispense Refill  . amiodarone (PACERONE) 200 MG tablet Take one tablet (200 mg) daily  135 tablet  3  . aspirin 81 MG tablet Take 81 mg by mouth daily. 2 DAILY       . calcium carbonate (TUMS - DOSED IN MG ELEMENTAL CALCIUM) 500 MG chewable tablet Chew 1 tablet by mouth as needed.        . carvedilol (COREG) 25 MG tablet Take 12.5 mg by mouth 2 (two) times daily with a meal. 12.5MG  IN THE AM, AND 25MG  IN THE PM      . fluticasone (FLONASE) 50 MCG/ACT nasal spray Place 2 sprays into the nose daily.        . furosemide (LASIX) 40 MG tablet Take 40 mg by mouth 2 (two) times daily.        . GuaiFENesin (MUCINEX PO) Take 800 mg by mouth 2 (two) times daily.        Marland Kitchen HYDROcodone-acetaminophen (VICODIN) 5-500 MG per tablet Take 1 tablet by mouth every 6 (six) hours as needed.        Marland Kitchen KLOR-CON M20 20 MEQ tablet TAKE ONE-HALF TABLET BY MOUTH EVERY DAY  45 each  5  . lisinopril (PRINIVIL,ZESTRIL) 10 MG tablet Take 1 tablet (10 mg total) by mouth 2 (two) times daily.  60 tablet  5  . loratadine (CLARITIN) 10 MG tablet Take 10 mg by mouth daily.        . Mometasone Furo-Formoterol Fum (DULERA IN) Inhale into the lungs. 2 puffs TID       . omega-3 acid ethyl esters (LOVAZA) 1 G capsule Take 1 g by mouth 2 (two) times daily.       . pantoprazole (PROTONIX) 40 MG tablet Take 40 mg by mouth 2  (two) times daily.        Bertram Gala Glycol-Propyl Glycol (SYSTANE OP) Apply to eye as needed.        . Polyethylene Glycol 3350 GRAN Take 1 packet by mouth 2 (two) times daily.        . psyllium (METAMUCIL) 58.6 % packet Take 1 packet by mouth daily.        . rosuvastatin (CRESTOR) 5 MG tablet Take 5 mg by mouth once a week.        . warfarin (COUMADIN) 5 MG tablet Take 5 mg by mouth daily. 5 mg Sat and Tues and 2.5mg  all others       . DISCONTD: amiodarone (PACERONE) 200 MG tablet Take one tablet (200 mg) in AM and 1/2 tablet (100 mg) in PM  135 tablet  3     Past Medical History  Diagnosis Date  . Dizziness   . SOB (shortness of breath)   . Indigestion   . Difficulty walking   . Chronic back pain   . Atrial fibrillation   . Ischemic  cardiomyopathy     WITH CHF  . CHF (congestive heart failure)     EF 35-40%  . Coronary artery disease   . VT (ventricular tachycardia)   . Hypertension   . Dyslipidemia   . Erythrocytosis   . GERD (gastroesophageal reflux disease)     ROS:   All systems reviewed and negative except as noted in the HPI.   Past Surgical History  Procedure Date  . Cardiac defibrillator placement   . Coronary artery bypass graft 1983     Family History  Problem Relation Age of Onset  . Heart disease    . Hypertension    . Stroke    . Diabetes       History   Social History  . Marital Status: Divorced    Spouse Name: N/A    Number of Children: 2  . Years of Education: N/A   Occupational History  . real estate    Social History Main Topics  . Smoking status: Former Smoker    Quit date: 06/23/1976  . Smokeless tobacco: Not on file  . Alcohol Use: No  . Drug Use: No  . Sexually Active: Not on file   Other Topics Concern  . Not on file   Social History Narrative  . No narrative on file     BP 102/52  Pulse 54  Ht 6' (1.829 m)  Wt 84.278 kg (185 lb 12.8 oz)  BMI 25.20 kg/m2  Physical Exam:  Well appearing NAD HEENT:  Unremarkable Neck:  No JVD, no thyromegally Lungs:  Clear with no wheezes, rales, or rhonchi. HEART:  Regular rate rhythm, no murmurs, no rubs, no clicks Abd:  soft, positive bowel sounds, no organomegally, no rebound, no guarding. Well-healed device in left upper quadrant Ext:  2 plus pulses, no edema, no cyanosis, no clubbing Skin:  No rashes no nodules Neuro:  CN II through XII intact, motor grossly intact  DEVICE  Normal device function.  See PaceArt for details. Approaching ERI  Assess/Plan:

## 2011-01-14 ENCOUNTER — Ambulatory Visit (INDEPENDENT_AMBULATORY_CARE_PROVIDER_SITE_OTHER): Payer: Self-pay | Admitting: Cardiology

## 2011-01-14 DIAGNOSIS — I4891 Unspecified atrial fibrillation: Secondary | ICD-10-CM

## 2011-01-14 DIAGNOSIS — R0989 Other specified symptoms and signs involving the circulatory and respiratory systems: Secondary | ICD-10-CM

## 2011-01-24 ENCOUNTER — Encounter: Payer: Self-pay | Admitting: Internal Medicine

## 2011-01-26 ENCOUNTER — Other Ambulatory Visit: Payer: Self-pay | Admitting: *Deleted

## 2011-01-26 MED ORDER — WARFARIN SODIUM 5 MG PO TABS: 5.0000 mg | ORAL_TABLET | Freq: Every day | ORAL | Status: DC

## 2011-01-27 ENCOUNTER — Other Ambulatory Visit: Payer: Self-pay | Admitting: *Deleted

## 2011-01-27 MED ORDER — ROSUVASTATIN CALCIUM 5 MG PO TABS: 5.0000 mg | ORAL_TABLET | ORAL | Status: DC

## 2011-01-28 ENCOUNTER — Ambulatory Visit (INDEPENDENT_AMBULATORY_CARE_PROVIDER_SITE_OTHER): Payer: Self-pay | Admitting: Cardiology

## 2011-01-28 DIAGNOSIS — I4891 Unspecified atrial fibrillation: Secondary | ICD-10-CM

## 2011-01-28 DIAGNOSIS — R0989 Other specified symptoms and signs involving the circulatory and respiratory systems: Secondary | ICD-10-CM

## 2011-02-07 DIAGNOSIS — R1033 Periumbilical pain: Secondary | ICD-10-CM | POA: Diagnosis not present

## 2011-02-09 ENCOUNTER — Ambulatory Visit (INDEPENDENT_AMBULATORY_CARE_PROVIDER_SITE_OTHER): Payer: Medicare Other | Admitting: Cardiology

## 2011-02-09 ENCOUNTER — Ambulatory Visit (INDEPENDENT_AMBULATORY_CARE_PROVIDER_SITE_OTHER): Payer: Medicare Other | Admitting: *Deleted

## 2011-02-09 ENCOUNTER — Encounter: Payer: Self-pay | Admitting: Cardiology

## 2011-02-09 VITALS — BP 104/45 | HR 47 | Ht 72.0 in | Wt 182.0 lb

## 2011-02-09 DIAGNOSIS — I4891 Unspecified atrial fibrillation: Secondary | ICD-10-CM | POA: Diagnosis not present

## 2011-02-09 DIAGNOSIS — I5022 Chronic systolic (congestive) heart failure: Secondary | ICD-10-CM

## 2011-02-09 DIAGNOSIS — I509 Heart failure, unspecified: Secondary | ICD-10-CM

## 2011-02-09 DIAGNOSIS — I2510 Atherosclerotic heart disease of native coronary artery without angina pectoris: Secondary | ICD-10-CM

## 2011-02-09 DIAGNOSIS — I472 Ventricular tachycardia, unspecified: Secondary | ICD-10-CM

## 2011-02-09 DIAGNOSIS — Z7901 Long term (current) use of anticoagulants: Secondary | ICD-10-CM

## 2011-02-09 LAB — POCT INR: INR: 2.9

## 2011-02-09 NOTE — Assessment & Plan Note (Signed)
INR is therapeutic at 2.9 today. Continue followup in the Coumadin clinic.

## 2011-02-09 NOTE — Progress Notes (Signed)
[name] Date of Birth: 06-11-1932   History of Present Illness: [name] is seen back today for a followup visit. He is doing very well. He was last hospitalized in October with congestive heart failure. Since then he has been taking his Lasix daily. His weight is down almost 19 pounds. He does complain of a dry mouth. Occasionally he will take an extra Lasix. He was seen in the ICD clinic on December 4. His amiodarone dose was reduced to 1 tablet daily. He denies any orthopnea or PND. He's had no dizziness or syncope. He denies any chest pain.  Current Outpatient Prescriptions on File Prior to Visit  Medication Sig Dispense Refill  . amiodarone (PACERONE) 200 MG tablet Take one tablet (200 mg) daily  135 tablet  3  . aspirin 81 MG tablet Take 81 mg by mouth daily. 2 DAILY       . calcium carbonate (TUMS - DOSED IN MG ELEMENTAL CALCIUM) 500 MG chewable tablet Chew 1 tablet by mouth as needed.        . carvedilol (COREG) 25 MG tablet Take 12.5 mg by mouth 2 (two) times daily with a meal. 12.5MG  IN THE AM, AND 25MG  IN THE PM      . fluticasone (FLONASE) 50 MCG/ACT nasal spray Place 2 sprays into the nose daily.        . furosemide (LASIX) 40 MG tablet Take 40 mg by mouth daily.       Marland Kitchen HYDROcodone-acetaminophen (VICODIN) 5-500 MG per tablet Take 1 tablet by mouth every 6 (six) hours as needed.        Marland Kitchen KLOR-CON M20 20 MEQ tablet TAKE ONE-HALF TABLET BY MOUTH EVERY DAY  45 each  5  . lisinopril (PRINIVIL,ZESTRIL) 10 MG tablet Take 1 tablet (10 mg total) by mouth 2 (two) times daily.  60 tablet  5  . loratadine (CLARITIN) 10 MG tablet Take 10 mg by mouth daily.        . Mometasone Furo-Formoterol Fum (DULERA IN) Inhale into the lungs. 2 puffs TID       . omega-3 acid ethyl esters (LOVAZA) 1 G capsule Take 1 g by mouth 2 (two) times daily.       Bertram Gala Glycol-Propyl Glycol (SYSTANE OP) Apply to eye as needed.        . Polyethylene Glycol 3350 GRAN Take 1 packet by mouth 2 (two)  times daily.        . psyllium (METAMUCIL) 58.6 % packet Take 1 packet by mouth daily.        . rosuvastatin (CRESTOR) 5 MG tablet Take 1 tablet (5 mg total) by mouth once a week.  30 tablet  5  . warfarin (COUMADIN) 5 MG tablet Take 1 tablet (5 mg total) by mouth daily.  45 tablet  3  . GuaiFENesin (MUCINEX PO) Take 800 mg by mouth 2 (two) times daily.          Allergies  Allergen Reactions  . Celebrex (Celecoxib) Hives    Gi upset  . Digoxin   . Multaq (Dronedarone Hydrochloride)   . Nexium Hives    Past Medical History  Diagnosis Date  . Dizziness   . SOB (shortness of breath)   . Indigestion   . Difficulty walking   . Chronic back pain   . Atrial fibrillation   . Ischemic cardiomyopathy     WITH CHF  . CHF (congestive heart failure)     EF  35-40%  . Coronary artery disease   . VT (ventricular tachycardia)   . Hypertension   . Dyslipidemia   . Erythrocytosis   . GERD (gastroesophageal reflux disease)     Past Surgical History  Procedure Date  . Cardiac defibrillator placement   . Coronary artery bypass graft 1983    History  Smoking status  . Former Smoker  . Quit date: 06/23/1976  Smokeless tobacco  . Not on file    History  Alcohol Use No    Family History  Problem Relation Age of Onset  . Heart disease    . Hypertension    . Stroke    . Diabetes      Review of Systems: Review of systems is positive for some increased stomach upset recently. This has improved with a switch to Dexilant. He is scheduled for endoscopy this week by Dr. Elnoria Howard. All other systems were reviewed and are negative.  Physical Exam: BP 104/45  Pulse 47  Ht 6' (1.829 m)  Wt 82.555 kg (182 lb)  BMI 24.68 kg/m2  Patient is pleasant and in no acute distress. He does appear chronically ill. Skin is warm and dry. Color is chronically sallow.  HEENT is unremarkable. Normocephalic/atraumatic. PERRL. Sclera are nonicteric. Neck is supple. No masses. No JVD. Lungs are fairly  clear. Cardiac exam shows a regular rate and rhythm. Abdomen is soft. Extremities are without edema. Gait and ROM are intact. No gross neurologic deficits noted.   LABORATORY DATA:    Assessment / Plan:

## 2011-02-09 NOTE — Assessment & Plan Note (Signed)
He appears to be well compensated on exam. Undoubtedly his dry mouth is related to his diuretics but we will continue this. Continue sodium restriction. I will followup again in 3 months.

## 2011-02-09 NOTE — Assessment & Plan Note (Signed)
He is having no anginal symptoms. Continue on his current medical therapy.

## 2011-02-09 NOTE — Assessment & Plan Note (Signed)
No recurrent episodes of ventricular tachycardia. Amiodarone dose was recently decreased. He has an ICD in place and will probably need a generator change within the next year.

## 2011-02-09 NOTE — Patient Instructions (Signed)
Continue your current medications.

## 2011-02-11 ENCOUNTER — Encounter (HOSPITAL_COMMUNITY): Payer: Self-pay

## 2011-02-11 ENCOUNTER — Encounter (HOSPITAL_COMMUNITY)
Admission: RE | Disposition: A | Discharge status: Home / Self Care | Payer: Self-pay | Source: Ambulatory Visit | Attending: Gastroenterology

## 2011-02-11 ENCOUNTER — Ambulatory Visit (HOSPITAL_COMMUNITY)
Admission: RE | Admit: 2011-02-11 | Discharge: 2011-02-11 | Disposition: A | Discharge status: Home / Self Care | Payer: Medicare Other | Source: Ambulatory Visit | Attending: Gastroenterology | Admitting: Gastroenterology

## 2011-02-11 DIAGNOSIS — Z951 Presence of aortocoronary bypass graft: Secondary | ICD-10-CM | POA: Diagnosis not present

## 2011-02-11 DIAGNOSIS — I10 Essential (primary) hypertension: Secondary | ICD-10-CM | POA: Insufficient documentation

## 2011-02-11 DIAGNOSIS — K219 Gastro-esophageal reflux disease without esophagitis: Secondary | ICD-10-CM | POA: Diagnosis not present

## 2011-02-11 DIAGNOSIS — I509 Heart failure, unspecified: Secondary | ICD-10-CM | POA: Diagnosis not present

## 2011-02-11 DIAGNOSIS — E785 Hyperlipidemia, unspecified: Secondary | ICD-10-CM | POA: Insufficient documentation

## 2011-02-11 DIAGNOSIS — Z95810 Presence of automatic (implantable) cardiac defibrillator: Secondary | ICD-10-CM | POA: Diagnosis not present

## 2011-02-11 DIAGNOSIS — R1013 Epigastric pain: Secondary | ICD-10-CM | POA: Diagnosis not present

## 2011-02-11 DIAGNOSIS — Z79899 Other long term (current) drug therapy: Secondary | ICD-10-CM | POA: Insufficient documentation

## 2011-02-11 DIAGNOSIS — I4891 Unspecified atrial fibrillation: Secondary | ICD-10-CM | POA: Diagnosis not present

## 2011-02-11 DIAGNOSIS — I2589 Other forms of chronic ischemic heart disease: Secondary | ICD-10-CM | POA: Diagnosis not present

## 2011-02-11 DIAGNOSIS — I2510 Atherosclerotic heart disease of native coronary artery without angina pectoris: Secondary | ICD-10-CM | POA: Insufficient documentation

## 2011-02-11 DIAGNOSIS — Z7982 Long term (current) use of aspirin: Secondary | ICD-10-CM | POA: Diagnosis not present

## 2011-02-11 HISTORY — PX: ESOPHAGOGASTRODUODENOSCOPY: SHX5428

## 2011-02-11 SURGERY — EGD (ESOPHAGOGASTRODUODENOSCOPY)
Anesthesia: Moderate Sedation

## 2011-02-11 MED ORDER — FENTANYL CITRATE 0.05 MG/ML IJ SOLN
INTRAMUSCULAR | Status: AC
  Filled 2011-02-11: qty 2

## 2011-02-11 MED ORDER — FENTANYL NICU IV SYRINGE 50 MCG/ML
INJECTION | INTRAMUSCULAR | Status: DC | PRN
  Administered 2011-02-11 (×2): 25 ug via INTRAVENOUS

## 2011-02-11 MED ORDER — BUTAMBEN-TETRACAINE-BENZOCAINE 2-2-14 % EX AERO
INHALATION_SPRAY | CUTANEOUS | Status: DC | PRN
  Administered 2011-02-11: 2 via TOPICAL

## 2011-02-11 MED ORDER — SODIUM CHLORIDE 0.9 % IV SOLN
Freq: Once | INTRAVENOUS | Status: AC
  Administered 2011-02-11: 500 mL via INTRAVENOUS

## 2011-02-11 MED ORDER — MIDAZOLAM HCL 10 MG/2ML IJ SOLN
INTRAMUSCULAR | Status: AC
  Filled 2011-02-11: qty 2

## 2011-02-11 MED ORDER — MIDAZOLAM HCL 10 MG/2ML IJ SOLN
INTRAMUSCULAR | Status: DC | PRN
  Administered 2011-02-11 (×3): 1 mg via INTRAVENOUS

## 2011-02-11 NOTE — Op Note (Signed)
[facility] [address] [place], [place]  [address]  OPERATIVE PROCEDURE REPORT  PATIENT:  Joshua, [name]  MR#:  [id number] BIRTHDATE:  Jan 07, 1933  GENDER:  male ENDOSCOPIST:  Jeani Hawking, MD PROCEDURE DATE:  02/11/2011 PROCEDURE:  EGD, diagnostic [phone] ASA CLASS:  Class III INDICATIONS:  Epigastric pain MEDICATIONS:  Fentanyl 50 mcg IV, Versed 3 mg IV  DESCRIPTION OF PROCEDURE:   After the risks benefits and alternatives of the procedure were thoroughly explained, informed consent was obtained.  The EG-[id number] ([id number]) endoscope was introduced through the mouth and advanced to the second portion of the duodenum, without limitations.  The instrument was slowly withdrawn as the mucosa was fully examined. <<PROCEDUREIMAGES>>  FINDINGS:  In the distal esophagus there was the finding of a possible varix versus vascular bleb. The gastric mucosa was mildly irregular and reminiscent of portal hypertensive gastropathy. No abdominal imaging scans since 2000 and his platelets in 2012 were in the 200K range. No other abnormalities identified. Retroflexed views revealed no abnormalities.    The scope was then withdrawn from the patient and the procedure terminated.  COMPLICATIONS:  None  IMPRESSION:  1) ? distal esophageal varix versus vasular bleb. 2) ? Portal hypertensive gastropathy. RECOMMENDATIONS:  1) Continue with PPI and sucralfate. 2) Follow up in one month.  ______________________________ Jeani Hawking, MD  n. Rosalie DoctorJeani Hawking at 02/11/2011 10:48 AM  [name], [phone]

## 2011-02-11 NOTE — Interval H&P Note (Signed)
History and Physical Interval Note:  02/11/2011 10:25 AM  [name]  has presented today for surgery, with the diagnosis of epigastric pain  The various methods of treatment have been discussed with the patient and family. After consideration of risks, benefits and other options for treatment, the patient has consented to  Procedure(s): ESOPHAGOGASTRODUODENOSCOPY (EGD) as a surgical intervention .  The patients' history has been reviewed, patient examined, no change in status, stable for surgery.  I have reviewed the patients' chart and labs.  Questions were answered to the patient's satisfaction.     Todrick Siedschlag D

## 2011-02-11 NOTE — H&P (View-Only) (Signed)
  [name] Date of Birth: 09/09/1932   History of Present Illness: [name] is seen back today for a followup visit. He is doing very well. He was last hospitalized in October with congestive heart failure. Since then he has been taking his Lasix daily. His weight is down almost 19 pounds. He does complain of a dry mouth. Occasionally he will take an extra Lasix. He was seen in the ICD clinic on December 4. His amiodarone dose was reduced to 1 tablet daily. He denies any orthopnea or PND. He's had no dizziness or syncope. He denies any chest pain.  Current Outpatient Prescriptions on File Prior to Visit  Medication Sig Dispense Refill  . amiodarone (PACERONE) 200 MG tablet Take one tablet (200 mg) daily  135 tablet  3  . aspirin 81 MG tablet Take 81 mg by mouth daily. 2 DAILY       . calcium carbonate (TUMS - DOSED IN MG ELEMENTAL CALCIUM) 500 MG chewable tablet Chew 1 tablet by mouth as needed.        . carvedilol (COREG) 25 MG tablet Take 12.5 mg by mouth 2 (two) times daily with a meal. 12.5MG IN THE AM, AND 25MG IN THE PM      . fluticasone (FLONASE) 50 MCG/ACT nasal spray Place 2 sprays into the nose daily.        . furosemide (LASIX) 40 MG tablet Take 40 mg by mouth daily.       . HYDROcodone-acetaminophen (VICODIN) 5-500 MG per tablet Take 1 tablet by mouth every 6 (six) hours as needed.        . KLOR-CON M20 20 MEQ tablet TAKE ONE-HALF TABLET BY MOUTH EVERY DAY  45 each  5  . lisinopril (PRINIVIL,ZESTRIL) 10 MG tablet Take 1 tablet (10 mg total) by mouth 2 (two) times daily.  60 tablet  5  . loratadine (CLARITIN) 10 MG tablet Take 10 mg by mouth daily.        . Mometasone Furo-Formoterol Fum (DULERA IN) Inhale into the lungs. 2 puffs TID       . omega-3 acid ethyl esters (LOVAZA) 1 G capsule Take 1 g by mouth 2 (two) times daily.       . Polyethyl Glycol-Propyl Glycol (SYSTANE OP) Apply to eye as needed.        . Polyethylene Glycol 3350 GRAN Take 1 packet by mouth 2 (two)  times daily.        . psyllium (METAMUCIL) 58.6 % packet Take 1 packet by mouth daily.        . rosuvastatin (CRESTOR) 5 MG tablet Take 1 tablet (5 mg total) by mouth once a week.  30 tablet  5  . warfarin (COUMADIN) 5 MG tablet Take 1 tablet (5 mg total) by mouth daily.  45 tablet  3  . GuaiFENesin (MUCINEX PO) Take 800 mg by mouth 2 (two) times daily.          Allergies  Allergen Reactions  . Celebrex (Celecoxib) Hives    Gi upset  . Digoxin   . Multaq (Dronedarone Hydrochloride)   . Nexium Hives    Past Medical History  Diagnosis Date  . Dizziness   . SOB (shortness of breath)   . Indigestion   . Difficulty walking   . Chronic back pain   . Atrial fibrillation   . Ischemic cardiomyopathy     WITH CHF  . CHF (congestive heart failure)     EF   35-40%  . Coronary artery disease   . VT (ventricular tachycardia)   . Hypertension   . Dyslipidemia   . Erythrocytosis   . GERD (gastroesophageal reflux disease)     Past Surgical History  Procedure Date  . Cardiac defibrillator placement   . Coronary artery bypass graft 1983    History  Smoking status  . Former Smoker  . Quit date: 06/23/1976  Smokeless tobacco  . Not on file    History  Alcohol Use No    Family History  Problem Relation Age of Onset  . Heart disease    . Hypertension    . Stroke    . Diabetes      Review of Systems: Review of systems is positive for some increased stomach upset recently. This has improved with a switch to Dexilant. He is scheduled for endoscopy this week by Dr. Hung. All other systems were reviewed and are negative.  Physical Exam: BP 104/45  Pulse 47  Ht 6' (1.829 m)  Wt 82.555 kg (182 lb)  BMI 24.68 kg/m2  Patient is pleasant and in no acute distress. He does appear chronically ill. Skin is warm and dry. Color is chronically sallow.  HEENT is unremarkable. Normocephalic/atraumatic. PERRL. Sclera are nonicteric. Neck is supple. No masses. No JVD. Lungs are fairly  clear. Cardiac exam shows a regular rate and rhythm. Abdomen is soft. Extremities are without edema. Gait and ROM are intact. No gross neurologic deficits noted.   LABORATORY DATA:    Assessment / Plan:  

## 2011-02-14 ENCOUNTER — Encounter (HOSPITAL_COMMUNITY): Payer: Self-pay | Admitting: Gastroenterology

## 2011-02-16 ENCOUNTER — Encounter: Payer: Self-pay | Admitting: Cardiology

## 2011-02-16 DIAGNOSIS — E060 Acute thyroiditis: Secondary | ICD-10-CM | POA: Diagnosis not present

## 2011-02-16 DIAGNOSIS — E119 Type 2 diabetes mellitus without complications: Secondary | ICD-10-CM | POA: Diagnosis not present

## 2011-02-16 DIAGNOSIS — R109 Unspecified abdominal pain: Secondary | ICD-10-CM | POA: Diagnosis not present

## 2011-02-16 DIAGNOSIS — I4891 Unspecified atrial fibrillation: Secondary | ICD-10-CM | POA: Diagnosis not present

## 2011-02-23 ENCOUNTER — Ambulatory Visit (INDEPENDENT_AMBULATORY_CARE_PROVIDER_SITE_OTHER): Payer: Medicare Other | Admitting: *Deleted

## 2011-02-23 DIAGNOSIS — I4891 Unspecified atrial fibrillation: Secondary | ICD-10-CM

## 2011-02-23 NOTE — Patient Instructions (Signed)
Patient has had less leafy green vegetable intake, re instructed importance of compliance with leafy green vegetable intake, vitamin K in foods, consistency with vegetable intake.

## 2011-02-25 DIAGNOSIS — L259 Unspecified contact dermatitis, unspecified cause: Secondary | ICD-10-CM | POA: Diagnosis not present

## 2011-02-28 ENCOUNTER — Other Ambulatory Visit: Payer: Self-pay | Admitting: Oncology

## 2011-02-28 ENCOUNTER — Ambulatory Visit (HOSPITAL_BASED_OUTPATIENT_CLINIC_OR_DEPARTMENT_OTHER): Payer: Medicare Other | Admitting: Oncology

## 2011-02-28 ENCOUNTER — Other Ambulatory Visit (HOSPITAL_BASED_OUTPATIENT_CLINIC_OR_DEPARTMENT_OTHER): Payer: Medicare Other | Admitting: Lab

## 2011-02-28 ENCOUNTER — Encounter: Payer: Self-pay | Admitting: Oncology

## 2011-02-28 VITALS — BP 93/53 | HR 60 | Temp 97.2°F | Ht 72.0 in | Wt 179.1 lb

## 2011-02-28 DIAGNOSIS — D509 Iron deficiency anemia, unspecified: Secondary | ICD-10-CM | POA: Diagnosis not present

## 2011-02-28 DIAGNOSIS — I4891 Unspecified atrial fibrillation: Secondary | ICD-10-CM

## 2011-02-28 LAB — CBC WITH DIFFERENTIAL/PLATELET
Basophils Absolute: 0 10*3/uL (ref 0.0–0.1)
Eosinophils Absolute: 0.2 10*3/uL (ref 0.0–0.5)
HGB: 11.2 g/dL — ABNORMAL LOW (ref 13.0–17.1)
LYMPH%: 16.9 % (ref 14.0–49.0)
MCV: 82.7 fL (ref 79.3–98.0)
MONO%: 19.5 % — ABNORMAL HIGH (ref 0.0–14.0)
NEUT#: 4.2 10*3/uL (ref 1.5–6.5)
Platelets: 218 10*3/uL (ref 140–400)

## 2011-02-28 LAB — LACTATE DEHYDROGENASE: LDH: 194 U/L (ref 94–250)

## 2011-02-28 LAB — COMPREHENSIVE METABOLIC PANEL
CO2: 27 mEq/L (ref 19–32)
Creatinine, Ser: 1.13 mg/dL (ref 0.50–1.35)
Glucose, Bld: 105 mg/dL — ABNORMAL HIGH (ref 70–99)
Total Bilirubin: 0.8 mg/dL (ref 0.3–1.2)
Total Protein: 6.7 g/dL (ref 6.0–8.3)

## 2011-02-28 LAB — FERRITIN: Ferritin: 32 ng/mL (ref 22–322)

## 2011-02-28 LAB — IRON AND TIBC
TIBC: 388 ug/dL (ref 215–435)
UIBC: 362 ug/dL (ref 125–400)

## 2011-02-28 NOTE — Progress Notes (Signed)
This office note has been dictated.  #[id number]

## 2011-02-28 NOTE — Progress Notes (Signed)
CC:   [name], M.D. [name], M.D.   PROBLEM LIST:  1. History of ischemic cardiomyopathy/chronic systolic congestive  heart failure with an ejection fraction of 34% to 40%.  2. The patient has ICD defibrillator unit.  3. Chronic obstructive pulmonary disease.  4. Coronary artery disease status post CABG in 1983 and PCI/stent,  2004.  5. History of ventricular tachycardia.  6. History of erythrocytosis treated in the past with phlebotomy.  7. Current history of iron deficiency state.  8. GERD.  9. Diabetes mellitus, type 2.  10.Dyslipidemia.  11.Chronic anticoagulation therapy with Coumadin. 12.Hypertension. 13.Osteoarthritis especially involving the right knee. 14.Abnormal thyroid imaging studies from 11/09/2010, status post     ultrasound guided fine needle aspiration of the dominant left     inferior thyroid nodule on 12/15/2010.  Cytology report showed rare     follicular epithelial cells and hemosiderin laden macrophages.   MEDICATIONS: 1. Amiodarone 200 mg daily. 2. Aspirin 162 mg daily. 3. Calcium carbonate 1 tablet as needed. 4. Coreg 12.5 mg twice daily. 5. Dexilant 60 mg daily. 6. Omega-3 fish oil 2000 mg daily. 7. Flonase nasal spray 50 mcg per ACT 2 sprays each nostril daily. 8. Lasix 40 mg twice daily. 9. Mucinex 800 mg twice daily. 10.Vicodin 5/500 one every 6 hours as needed. 11.Klor-Con 10 mEq daily. 12.Lisinopril 10 mg twice daily. 13.Claritin 10 mg daily. 14.Dulera inhaled into lungs 2 puffs 3 times a day. 15.Lovaza 1 g twice daily. 16.Systane eyedrops as needed. 17.Polyethylene glycol 1 packet twice daily. 18.Metamucil 1 packet daily. 19.Crestor 5 mg weekly. 20.Carafate 1 g 4 times daily. 21.Coumadin 5 mg daily.  HISTORY:  I last saw [name] on 12/29/2010 for followup of his iron-deficiency anemia.  In the past we had followed [name] for a relative erythrocytosis.  In November 1996, [name]'s hemoglobin was 20 and his  hematocrit was 57.1.  JAK2 mutation was not detected approximately 10 years ago.  The patient's last phlebotomy was in January 2008.  [name] has developed iron deficiency and required Feraheme 255 mg on October 21, 2010 and an additional dose of IV Feraheme 510 mg on 11/16/2010.  Stools that were checked in October 2011 and in late August of 2012 were negative.  The patient has seen Dr. Jeani Hawking in the past.  I am not sure when his last GI workup was. He is on Coumadin for a history of atrial fibrillation.  [name] is without complaints today.  He lives alone.  He is fairly independent and drives.  He lives in a condo by himself.  PHYSICAL EXAMINATION:  There is little change.  His weight has dropped another few pounds down to 179 pounds from 182 pounds back on 11/28. Height 6 feet even.  Body surface area 2.03 m squared.  Blood pressure today 93/53.  The patient was informed about his low blood pressure.  He is on antihypertensive medicine.  Pulse is regular today at 60.  Other vital signs are normal.  He is afebrile.  There is no scleral icterus. Mouth and pharynx are benign.  No peripheral adenopathy palpable. Lungs:  Clear to percussion auscultation.  Cardiac:  Rhythm today is regular without murmur or rub.  The patient has a defibrillator unit in his left mid abdomen.  Abdomen:  With the patient sitting is benign. Previously I thought that I might be able to feel his liver below the right costal margin in the anterior axillary line.  No  obvious ascites or abdominal masses.  Extremities:  The patient has purpura.  Right ankle is puffy with some slight edema.  No edema on the left leg.  The patient walks with a 4 pronged cane.  LABORATORY DATA:  Today, white count 6.9, ANC 4.2, hemoglobin 11.2, hematocrit 34.3, platelets 218,000.  MCV 82.7 MCH 26.9.  On 11/28 the hemoglobin was 11.6, hematocrit 36.2, and on 11/01 hemoglobin was 11.9 and hematocrit 39.9.  On 11/30/2010  the hemoglobin was 11.7, hematocrit 39.8.  Chemistries from 12/29/2010 notable for a BUN of 31, creatinine 1.23 suggesting some slight degree of renal insufficiency.  Albumin was 4.3.  Liver function tests were normal.  Glucose was 113.  On 02/23/2011 the INR was 3.9.  On 02/09/2011 INR was 2.9, on 12/28 was 3.5, on 12/14 was 1.7, on 12/03 was 1.5.  On 11/28 the ferritin was 92.  On 11/1 it was 179, on 10/05 was 43, on 09/17 was 16.  IMAGING STUDIES: 1. On 11/09/2010 nuclear medicine 24 hour thyroid uptake showed a low     24 hour uptake of 0.8%. 2. Chest x-ray from 11/27/2010 showed borderline cardiomegaly with a     right greater than left pleural effusion, mild associated     atelectasis but no pulmonary vascular congestion. 3. CT scan of the chest without IV contrast on 11/27/2010 showed mild     patchy opacity in the left lower lobe possibly infectious, moderate     right and trace left pleural effusion with associated lower lobe     atelectasis, moderate centrilobular emphysematous changes with no     suspicious pulmonary nodules, bilateral thyroid nodules measuring     up to 4.6 cm on the left. 4. Chest x-ray, 2 views, on 12/31/2010 showed stable chest findings     with persistent right pleural effusion and basilar densities. 5. Ultrasound guided needle aspirate biopsy of the thyroid gland on     12/14/2009 was carried out.  IMPRESSION AND PLAN:  Clinically [name] is stable.  His hemoglobin and hematocrit seem to be drifting downward.  We are awaiting his iron studies today.  He may need additional IV iron.  Of note today is his somewhat low blood pressure.  I brought this to the patient's attention. It is interesting that his cardiac rhythm is regular today.  We will await the results of the iron studies and determine whether he needs additional IV iron.  We will plan to check CBC, iron, TIBC and ferritin in 2 months.  I will plan to see [name] in 4 months  at which time we will check CBC, chemistries, and complete iron studies.  Ferritin came back 32, down from 92 on 12/29/10 and 179 on 12/02/10.  We will plan to give him another dose of IV feraheme, check stools and refer him to  Dr. Jeani Hawking.  ______________________________ Samul Dada, M.D. DSM/MEDQ  D:  02/28/2011  T:  02/28/2011  Job:  [id number]

## 2011-03-01 ENCOUNTER — Telehealth: Payer: Self-pay | Admitting: Medical Oncology

## 2011-03-01 NOTE — Telephone Encounter (Signed)
I called pt per Dr. Arline Asp to let him know that his ferritin has dropped. Dr. Arline Asp would like for him to do stools cards and he also is going to set pt up for  IV feraheme.  I am going to mail stool cards to pt and he can bring back when he comes to get feraheme. Dr. Arline Asp has also sent a referral to Dr. Elnoria Howard to get pt seen again.

## 2011-03-03 ENCOUNTER — Telehealth: Payer: Self-pay | Admitting: Oncology

## 2011-03-03 NOTE — Telephone Encounter (Signed)
pt aware of 2/5 appt and that he will see dr hung on 2/6 pt also had 2/11 w/ him which i cx per pt .will print sch when he comes in      aom

## 2011-03-08 ENCOUNTER — Other Ambulatory Visit (HOSPITAL_BASED_OUTPATIENT_CLINIC_OR_DEPARTMENT_OTHER): Payer: Medicare Other | Admitting: Oncology

## 2011-03-08 ENCOUNTER — Ambulatory Visit (HOSPITAL_BASED_OUTPATIENT_CLINIC_OR_DEPARTMENT_OTHER): Payer: Medicare Other

## 2011-03-08 VITALS — BP 147/69 | HR 62 | Temp 97.7°F

## 2011-03-08 DIAGNOSIS — D509 Iron deficiency anemia, unspecified: Secondary | ICD-10-CM

## 2011-03-08 MED ORDER — FERUMOXYTOL INJECTION 510 MG/17 ML
510.0000 mg | Freq: Once | INTRAVENOUS | Status: AC
  Administered 2011-03-08: 510 mg via INTRAVENOUS
  Filled 2011-03-08: qty 17

## 2011-03-08 MED ORDER — SODIUM CHLORIDE 0.9 % IV SOLN
Freq: Once | INTRAVENOUS | Status: AC
  Administered 2011-03-08: 09:00:00 via INTRAVENOUS

## 2011-03-08 NOTE — Patient Instructions (Signed)
[facility] Discharge Instructions for Patients Receiving Iron Today you received the following chemotherapy agents Feraheme.  BELOW ARE SYMPTOMS THAT SHOULD BE REPORTED IMMEDIATELY:  *FEVER GREATER THAN 100.5 F  *CHILLS WITH OR WITHOUT FEVER  *UNUSUAL SHORTNESS OF BREATH  *UNUSUAL BRUISING OR BLEEDING  TENDERNESS IN MOUTH AND THROAT WITH OR WITHOUT PRESENCE OF ULCERS  *URINARY PROBLEMS  *BOWEL PROBLEMS  UNUSUAL RASH Items with * indicate a potential emergency and should be followed up as soon as possible.  Feel free to call the clinic you have any questions or concerns. The clinic phone number is [phone].   I have been informed and understand all the instructions given to me. I know to contact the clinic, my physician, or go to the Emergency Department if any problems should occur. I do not have any questions at this time, but understand that I may call the clinic during office hours   should I have any questions or need assistance in obtaining follow up care.    __________________________________________  _____________  __________ Signature of Patient or Authorized Representative            Date                   Time    __________________________________________ Nurse's Signature

## 2011-03-09 ENCOUNTER — Telehealth: Payer: Self-pay | Admitting: Medical Oncology

## 2011-03-09 NOTE — Telephone Encounter (Signed)
I called pt to inform him that his stool cards were positive for blood. He states that he is seeing Dr. Elnoria Howard today. I faxed results to Dr. Elnoria Howard.

## 2011-03-14 DIAGNOSIS — L259 Unspecified contact dermatitis, unspecified cause: Secondary | ICD-10-CM | POA: Diagnosis not present

## 2011-03-16 ENCOUNTER — Ambulatory Visit (INDEPENDENT_AMBULATORY_CARE_PROVIDER_SITE_OTHER): Payer: Medicare Other | Admitting: Pharmacist

## 2011-03-16 DIAGNOSIS — I4891 Unspecified atrial fibrillation: Secondary | ICD-10-CM | POA: Diagnosis not present

## 2011-03-21 ENCOUNTER — Other Ambulatory Visit: Payer: Self-pay | Admitting: Nurse Practitioner

## 2011-03-21 ENCOUNTER — Telehealth: Payer: Self-pay | Admitting: Nurse Practitioner

## 2011-03-21 NOTE — Telephone Encounter (Signed)
Pt called- stated he would like referral to [facility] for colonoscopy d/t he is unhappy with Dr. Elnoria Howard.  Referral sent.

## 2011-03-24 ENCOUNTER — Telehealth: Payer: Self-pay | Admitting: Oncology

## 2011-03-24 NOTE — Telephone Encounter (Signed)
Per 2/18 pof refer to [facility]. S/w pt [name] today and per [name] pt has already called them re appt and has been instructed to have dr Haywood Pao office send them his records and once reviewed they will call him w/appt. Robin informed and will see if we have last note from dr hung and if not she will have them send it to her again. appt pending records from dr hung.

## 2011-03-28 DIAGNOSIS — L905 Scar conditions and fibrosis of skin: Secondary | ICD-10-CM | POA: Diagnosis not present

## 2011-03-28 DIAGNOSIS — L259 Unspecified contact dermatitis, unspecified cause: Secondary | ICD-10-CM | POA: Diagnosis not present

## 2011-03-28 DIAGNOSIS — L98499 Non-pressure chronic ulcer of skin of other sites with unspecified severity: Secondary | ICD-10-CM | POA: Diagnosis not present

## 2011-03-28 DIAGNOSIS — D485 Neoplasm of uncertain behavior of skin: Secondary | ICD-10-CM | POA: Diagnosis not present

## 2011-03-28 DIAGNOSIS — B029 Zoster without complications: Secondary | ICD-10-CM | POA: Diagnosis not present

## 2011-04-07 ENCOUNTER — Encounter: Payer: Self-pay | Admitting: Internal Medicine

## 2011-04-07 ENCOUNTER — Ambulatory Visit (INDEPENDENT_AMBULATORY_CARE_PROVIDER_SITE_OTHER): Payer: Medicare Other | Admitting: *Deleted

## 2011-04-07 DIAGNOSIS — Z95810 Presence of automatic (implantable) cardiac defibrillator: Secondary | ICD-10-CM | POA: Diagnosis not present

## 2011-04-07 DIAGNOSIS — I4891 Unspecified atrial fibrillation: Secondary | ICD-10-CM

## 2011-04-07 DIAGNOSIS — I472 Ventricular tachycardia: Secondary | ICD-10-CM

## 2011-04-07 DIAGNOSIS — R001 Bradycardia, unspecified: Secondary | ICD-10-CM

## 2011-04-07 DIAGNOSIS — I498 Other specified cardiac arrhythmias: Secondary | ICD-10-CM

## 2011-04-08 LAB — REMOTE ICD DEVICE
RV LEAD IMPEDENCE ICD: 424 Ohm
TZAT-0004FASTVT: 8
TZAT-0004SLOWVT: 8
TZAT-0004SLOWVT: 8
TZAT-0005FASTVT: 88 pct
TZAT-0005SLOWVT: 84 pct
TZAT-0005SLOWVT: 91 pct
TZAT-0011FASTVT: 10 ms
TZAT-0011SLOWVT: 10 ms
TZAT-0011SLOWVT: 10 ms
TZAT-0012FASTVT: 200 ms
TZAT-0020FASTVT: 1.6 ms
TZON-0003FASTVT: 240 ms
TZON-0005SLOWVT: 16
TZON-0011AFLUTTER: 70
TZST-0001FASTVT: 4
TZST-0001SLOWVT: 4
TZST-0001SLOWVT: 6
TZST-0003FASTVT: 30 J
TZST-0003FASTVT: 30 J
TZST-0003FASTVT: 30 J
TZST-0003SLOWVT: 20 J
TZST-0003SLOWVT: 30 J

## 2011-04-12 ENCOUNTER — Ambulatory Visit (INDEPENDENT_AMBULATORY_CARE_PROVIDER_SITE_OTHER): Payer: Medicare Other | Admitting: Pharmacist

## 2011-04-12 DIAGNOSIS — I4891 Unspecified atrial fibrillation: Secondary | ICD-10-CM | POA: Diagnosis not present

## 2011-04-12 LAB — POCT INR: INR: 1.3

## 2011-04-13 NOTE — Progress Notes (Signed)
Remote icd check  

## 2011-04-21 ENCOUNTER — Encounter: Payer: Self-pay | Admitting: Internal Medicine

## 2011-04-25 ENCOUNTER — Encounter: Payer: Self-pay | Admitting: Internal Medicine

## 2011-04-26 ENCOUNTER — Ambulatory Visit (INDEPENDENT_AMBULATORY_CARE_PROVIDER_SITE_OTHER): Payer: Medicare Other | Admitting: *Deleted

## 2011-04-26 DIAGNOSIS — I4891 Unspecified atrial fibrillation: Secondary | ICD-10-CM

## 2011-04-26 LAB — POCT INR: INR: 1.8

## 2011-04-29 ENCOUNTER — Other Ambulatory Visit (HOSPITAL_BASED_OUTPATIENT_CLINIC_OR_DEPARTMENT_OTHER): Payer: Medicare Other | Admitting: Lab

## 2011-04-29 DIAGNOSIS — D509 Iron deficiency anemia, unspecified: Secondary | ICD-10-CM

## 2011-04-29 LAB — CBC WITH DIFFERENTIAL/PLATELET
BASO%: 0.4 % (ref 0.0–2.0)
Basophils Absolute: 0 10*3/uL (ref 0.0–0.1)
EOS%: 1.3 % (ref 0.0–7.0)
HCT: 40.2 % (ref 38.4–49.9)
LYMPH%: 21.8 % (ref 14.0–49.0)
MCH: 27.9 pg (ref 27.2–33.4)
MCHC: 32.3 g/dL (ref 32.0–36.0)
MCV: 86.3 fL (ref 79.3–98.0)
MONO%: 17.3 % — ABNORMAL HIGH (ref 0.0–14.0)
NEUT%: 59.2 % (ref 39.0–75.0)
lymph#: 1.6 10*3/uL (ref 0.9–3.3)

## 2011-04-29 LAB — IRON AND TIBC
%SAT: 17 % — ABNORMAL LOW (ref 20–55)
Iron: 56 ug/dL (ref 42–165)

## 2011-05-04 ENCOUNTER — Other Ambulatory Visit: Payer: Self-pay | Admitting: Cardiology

## 2011-05-04 ENCOUNTER — Other Ambulatory Visit: Payer: Self-pay

## 2011-05-04 MED ORDER — CARVEDILOL 25 MG PO TABS: ORAL_TABLET | ORAL | Status: DC

## 2011-05-10 ENCOUNTER — Ambulatory Visit (INDEPENDENT_AMBULATORY_CARE_PROVIDER_SITE_OTHER): Payer: Medicare Other | Admitting: Cardiology

## 2011-05-10 ENCOUNTER — Telehealth: Payer: Self-pay | Admitting: Internal Medicine

## 2011-05-10 ENCOUNTER — Encounter: Payer: Self-pay | Admitting: Internal Medicine

## 2011-05-10 ENCOUNTER — Ambulatory Visit (INDEPENDENT_AMBULATORY_CARE_PROVIDER_SITE_OTHER): Payer: Medicare Other | Admitting: *Deleted

## 2011-05-10 ENCOUNTER — Encounter: Payer: Self-pay | Admitting: Cardiology

## 2011-05-10 ENCOUNTER — Other Ambulatory Visit: Payer: Self-pay | Admitting: *Deleted

## 2011-05-10 VITALS — BP 122/78 | HR 60 | Ht 72.0 in | Wt 183.0 lb

## 2011-05-10 DIAGNOSIS — I509 Heart failure, unspecified: Secondary | ICD-10-CM

## 2011-05-10 DIAGNOSIS — I498 Other specified cardiac arrhythmias: Secondary | ICD-10-CM | POA: Diagnosis not present

## 2011-05-10 DIAGNOSIS — I472 Ventricular tachycardia, unspecified: Secondary | ICD-10-CM

## 2011-05-10 DIAGNOSIS — Z7901 Long term (current) use of anticoagulants: Secondary | ICD-10-CM | POA: Diagnosis not present

## 2011-05-10 DIAGNOSIS — I4891 Unspecified atrial fibrillation: Secondary | ICD-10-CM

## 2011-05-10 DIAGNOSIS — I2510 Atherosclerotic heart disease of native coronary artery without angina pectoris: Secondary | ICD-10-CM

## 2011-05-10 DIAGNOSIS — I5022 Chronic systolic (congestive) heart failure: Secondary | ICD-10-CM

## 2011-05-10 DIAGNOSIS — Z45018 Encounter for adjustment and management of other part of cardiac pacemaker: Secondary | ICD-10-CM

## 2011-05-10 LAB — ICD DEVICE OBSERVATION
BATTERY VOLTAGE: 2.62 V
BRDY-0002RV: 40 {beats}/min
TZAT-0001FASTVT: 1
TZAT-0012SLOWVT: 200 ms
TZAT-0012SLOWVT: 200 ms
TZAT-0013FASTVT: 1
TZAT-0013SLOWVT: 3
TZAT-0018FASTVT: NEGATIVE
TZAT-0018SLOWVT: NEGATIVE
TZAT-0019SLOWVT: 8 V
TZAT-0019SLOWVT: 8 V
TZAT-0020FASTVT: 1.6 ms
TZAT-0020SLOWVT: 1.6 ms
TZAT-0020SLOWVT: 1.6 ms
TZON-0003SLOWVT: 350 ms
TZON-0008SLOWVT: 0 ms
TZST-0001FASTVT: 2
TZST-0001FASTVT: 4
TZST-0001SLOWVT: 5
TZST-0003FASTVT: 30 J
TZST-0003FASTVT: 30 J
TZST-0003SLOWVT: 20 J
TZST-0003SLOWVT: 30 J
TZST-0003SLOWVT: 30 J
VENTRICULAR PACING ICD: 0 pct

## 2011-05-10 LAB — CBC WITH DIFFERENTIAL/PLATELET
Basophils Absolute: 0 10*3/uL (ref 0.0–0.1)
Eosinophils Absolute: 0.1 10*3/uL (ref 0.0–0.7)
Hemoglobin: 13 g/dL (ref 13.0–17.0)
Lymphocytes Relative: 22.7 % (ref 12.0–46.0)
MCHC: 33.1 g/dL (ref 30.0–36.0)
Neutro Abs: 3.6 10*3/uL (ref 1.4–7.7)
Neutrophils Relative %: 57.6 % (ref 43.0–77.0)
Platelets: 168 10*3/uL (ref 150.0–400.0)
RDW: 21 % — ABNORMAL HIGH (ref 11.5–14.6)

## 2011-05-10 LAB — POCT INR: INR: 1.5

## 2011-05-10 LAB — BASIC METABOLIC PANEL
Chloride: 102 mEq/L (ref 96–112)
Creatinine, Ser: 1 mg/dL (ref 0.4–1.5)
Sodium: 138 mEq/L (ref 135–145)

## 2011-05-10 NOTE — Assessment & Plan Note (Signed)
He is on chronic amiodarone therapy at 200 mg daily. His ICD is at elective replacement indicator. Dr. [name] has seen him today and will arrange for generator change out.

## 2011-05-10 NOTE — Telephone Encounter (Signed)
Pt pacer is beeping and he has an appt at 1130 today

## 2011-05-10 NOTE — Assessment & Plan Note (Signed)
He has no significant anginal symptoms at this time. Continue medical therapy with beta blockers, ACE inhibitor, and statin therapy.

## 2011-05-10 NOTE — Assessment & Plan Note (Signed)
He appears to be doing fairly well with once daily Lasix. I stressed the importance of checking his weight every day and if he has any increase in weight or edema he is to take an extra Lasix a day. Also reinforced the need for sodium restriction in his diet particularly as he eats a lot of canned foods. We will continue his other therapy and followup again in 3 months.

## 2011-05-10 NOTE — Progress Notes (Signed)
ICD check 

## 2011-05-10 NOTE — Progress Notes (Signed)
Addended by: Weston Brass R on: 05/10/2011 11:53 AM   Modules accepted: Orders

## 2011-05-10 NOTE — Progress Notes (Signed)
[name] Date of Birth: [birth year]   History of Present Illness: [name] is seen back today for followup. He reports that he is doing very well. He's had no significant chest pain. Once in a while at night he gets reflux and he states this messes up his breathing for a few minutes. He does note that his ICD has been beeping for the past week. According to his last ICD check he reached elective replacement indicator in mid February. He has only been taking his Lasix once a day. He complains that if he takes it twice a day it makes him urinate too much. He has been monitoring his weight and hasn't changed. His INR was checked today was only 1.5. He relates this to eating asparagus.  Current Outpatient Prescriptions on File Prior to Visit  Medication Sig Dispense Refill  . amiodarone (PACERONE) 200 MG tablet Take one tablet (200 mg) daily  135 tablet  3  . aspirin 81 MG tablet Take 81 mg by mouth daily. 2 DAILY       . calcium carbonate (TUMS - DOSED IN MG ELEMENTAL CALCIUM) 500 MG chewable tablet Chew 1 tablet by mouth as needed.        . carvedilol (COREG) 25 MG tablet 12.5MG  IN THE AM, AND 25MG  IN THE PM  60 tablet  6  . dexlansoprazole (DEXILANT) 60 MG capsule Take 60 mg by mouth daily.      . fish oil-omega-3 fatty acids 1000 MG capsule Take 2 g by mouth daily.      . fluticasone (FLONASE) 50 MCG/ACT nasal spray Place 2 sprays into the nose daily.        . furosemide (LASIX) 40 MG tablet Take 40 mg by mouth 2 (two) times daily.       . GuaiFENesin (MUCINEX PO) Take 800 mg by mouth 2 (two) times daily.        Marland Kitchen HYDROcodone-acetaminophen (VICODIN) 5-500 MG per tablet Take 1 tablet by mouth every 6 (six) hours as needed.        . hydrOXYzine (ATARAX/VISTARIL) 10 MG tablet Take 10 mg by mouth 2 (two) times daily.      Marland Kitchen KLOR-CON M20 20 MEQ tablet TAKE ONE-HALF TABLET BY MOUTH EVERY DAY  45 each  5  . lisinopril (PRINIVIL,ZESTRIL) 10 MG tablet Take 1 tablet (10 mg total) by mouth 2  (two) times daily.  60 tablet  5  . loratadine (CLARITIN) 10 MG tablet Take 10 mg by mouth daily.        . Mometasone Furo-Formoterol Fum (DULERA IN) Inhale into the lungs. 2 puffs TID       . omega-3 acid ethyl esters (LOVAZA) 1 G capsule Take 1 g by mouth 2 (two) times daily.       Bertram Gala Glycol-Propyl Glycol (SYSTANE OP) Apply to eye as needed.        . Polyethylene Glycol 3350 GRAN Take 1 packet by mouth 2 (two) times daily.        . psyllium (METAMUCIL) 58.6 % packet Take 1 packet by mouth daily.        . rosuvastatin (CRESTOR) 5 MG tablet Take 1 tablet (5 mg total) by mouth once a week.  30 tablet  5  . sucralfate (CARAFATE) 1 G tablet Take 1 g by mouth 4 (four) times daily.      Marland Kitchen warfarin (COUMADIN) 5 MG tablet Take 1 tablet (5 mg total) by mouth daily.  45 tablet  3    Allergies  Allergen Reactions  . Celebrex (Celecoxib) Hives    Gi upset  . Digoxin   . Multaq (Dronedarone Hydrochloride)   . Nexium Hives    Past Medical History  Diagnosis Date  . Dizziness   . SOB (shortness of breath)   . Indigestion   . Difficulty walking   . Chronic back pain   . Atrial fibrillation   . Ischemic cardiomyopathy     WITH CHF  . CHF (congestive heart failure)     EF 35-40%  . Coronary artery disease   . VT (ventricular tachycardia)   . Hypertension   . Dyslipidemia   . Erythrocytosis   . GERD (gastroesophageal reflux disease)     Past Surgical History  Procedure Date  . Cardiac defibrillator placement   . Coronary artery bypass graft 1983  . Cholecystectomy   . Esophagogastroduodenoscopy 02/11/2011    Procedure: ESOPHAGOGASTRODUODENOSCOPY (EGD);  Surgeon: Theda Belfast, MD;  Location: [facility];  Service: Endoscopy;  Laterality: N/A;  . Cardiac catheterization 10/17/06    History  Smoking status  . Former Smoker  . Quit date: 06/23/1976  Smokeless tobacco  . Not on file    History  Alcohol Use No    Family History  Problem Relation Age of Onset  .  Heart disease    . Hypertension    . Stroke    . Diabetes    . Heart disease Mother   . Heart disease Father     Review of Systems: As noted in history of present illness. All other systems were reviewed and are negative.  Physical Exam: BP 122/78  Pulse 60  Ht 6' (1.829 m)  Wt 183 lb (83.008 kg)  BMI 24.82 kg/m2  Patient is pleasant and in no acute distress. He does appear chronically ill. Skin is warm and dry. Color is chronically sallow.  HEENT is unremarkable. Normocephalic/atraumatic. PERRL. Sclera are nonicteric. Neck is supple. No masses. No JVD. Lungs are  clear. Cardiac exam shows a regular rate and rhythm. Abdomen is soft. Extremities are without edema. Gait and ROM are intact. No gross neurologic deficits noted.   LABORATORY DATA:    Assessment / Plan:

## 2011-05-10 NOTE — Telephone Encounter (Signed)
Will address at office visit, discussed with [facility] LPN with Dr Swaziland

## 2011-05-10 NOTE — Patient Instructions (Signed)
Dr. [name] will arrange change out of your ICD device.  Avoid salt.  Weigh daily - if you notice any increase weight or swelling take an extra Lasix that day.  I will see you again in 3 months.

## 2011-05-12 ENCOUNTER — Encounter (HOSPITAL_COMMUNITY): Payer: Self-pay | Admitting: Pharmacy Technician

## 2011-05-16 DIAGNOSIS — B029 Zoster without complications: Secondary | ICD-10-CM | POA: Diagnosis not present

## 2011-05-18 ENCOUNTER — Encounter: Payer: Medicare Other | Admitting: Internal Medicine

## 2011-05-19 DIAGNOSIS — E785 Hyperlipidemia, unspecified: Secondary | ICD-10-CM | POA: Diagnosis not present

## 2011-05-19 DIAGNOSIS — I10 Essential (primary) hypertension: Secondary | ICD-10-CM | POA: Diagnosis not present

## 2011-05-19 DIAGNOSIS — Z4502 Encounter for adjustment and management of automatic implantable cardiac defibrillator: Secondary | ICD-10-CM | POA: Diagnosis not present

## 2011-05-19 DIAGNOSIS — I509 Heart failure, unspecified: Secondary | ICD-10-CM | POA: Diagnosis not present

## 2011-05-19 DIAGNOSIS — I4891 Unspecified atrial fibrillation: Secondary | ICD-10-CM | POA: Diagnosis not present

## 2011-05-19 DIAGNOSIS — I2589 Other forms of chronic ischemic heart disease: Secondary | ICD-10-CM | POA: Diagnosis not present

## 2011-05-19 MED ORDER — SODIUM CHLORIDE 0.9 % IR SOLN
80.0000 mg | Status: DC
  Filled 2011-05-19: qty 2

## 2011-05-19 MED ORDER — CEFAZOLIN SODIUM-DEXTROSE 2-3 GM-% IV SOLR
2.0000 g | INTRAVENOUS | Status: DC
  Filled 2011-05-19: qty 50

## 2011-05-20 ENCOUNTER — Encounter (HOSPITAL_COMMUNITY): Payer: Self-pay | Admitting: General Practice

## 2011-05-20 ENCOUNTER — Encounter (HOSPITAL_COMMUNITY)
Admission: RE | Disposition: A | Discharge status: Home / Self Care | Payer: Self-pay | Source: Ambulatory Visit | Attending: Internal Medicine

## 2011-05-20 ENCOUNTER — Ambulatory Visit: Payer: Medicare Other | Admitting: Internal Medicine

## 2011-05-20 ENCOUNTER — Ambulatory Visit (HOSPITAL_COMMUNITY)
Admission: RE | Admit: 2011-05-20 | Discharge: 2011-05-21 | Disposition: A | Discharge status: Home / Self Care | Payer: Medicare Other | Source: Ambulatory Visit | Attending: Internal Medicine | Admitting: Internal Medicine

## 2011-05-20 DIAGNOSIS — I472 Ventricular tachycardia: Secondary | ICD-10-CM

## 2011-05-20 DIAGNOSIS — I509 Heart failure, unspecified: Secondary | ICD-10-CM | POA: Insufficient documentation

## 2011-05-20 DIAGNOSIS — I2589 Other forms of chronic ischemic heart disease: Secondary | ICD-10-CM | POA: Insufficient documentation

## 2011-05-20 DIAGNOSIS — E785 Hyperlipidemia, unspecified: Secondary | ICD-10-CM | POA: Insufficient documentation

## 2011-05-20 DIAGNOSIS — Z4502 Encounter for adjustment and management of automatic implantable cardiac defibrillator: Secondary | ICD-10-CM | POA: Insufficient documentation

## 2011-05-20 DIAGNOSIS — Z45018 Encounter for adjustment and management of other part of cardiac pacemaker: Secondary | ICD-10-CM

## 2011-05-20 DIAGNOSIS — I4891 Unspecified atrial fibrillation: Secondary | ICD-10-CM | POA: Insufficient documentation

## 2011-05-20 DIAGNOSIS — I10 Essential (primary) hypertension: Secondary | ICD-10-CM | POA: Insufficient documentation

## 2011-05-20 HISTORY — PX: IMPLANTABLE CARDIOVERTER DEFIBRILLATOR (ICD) GENERATOR CHANGE: SHX5469

## 2011-05-20 HISTORY — DX: Abnormal results of thyroid function studies: R94.6

## 2011-05-20 HISTORY — DX: Unspecified osteoarthritis, unspecified site: M19.90

## 2011-05-20 HISTORY — DX: Acute myocardial infarction, unspecified: I21.9

## 2011-05-20 LAB — SURGICAL PCR SCREEN
MRSA, PCR: POSITIVE — AB
Staphylococcus aureus: POSITIVE — AB

## 2011-05-20 LAB — GLUCOSE, CAPILLARY: Glucose-Capillary: 93 mg/dL (ref 70–99)

## 2011-05-20 SURGERY — ICD GENERATOR CHANGE
Anesthesia: LOCAL

## 2011-05-20 MED ORDER — MUPIROCIN 2 % EX OINT
TOPICAL_OINTMENT | CUTANEOUS | Status: AC
  Administered 2011-05-20: 1 via NASAL
  Filled 2011-05-20: qty 22

## 2011-05-20 MED ORDER — MUPIROCIN 2 % EX OINT
TOPICAL_OINTMENT | Freq: Once | CUTANEOUS | Status: AC
  Administered 2011-05-20: 1 via NASAL
  Filled 2011-05-20: qty 22

## 2011-05-20 MED ORDER — FENTANYL CITRATE 0.05 MG/ML IJ SOLN: 25.0000 ug | INTRAMUSCULAR | Status: DC | PRN

## 2011-05-20 MED ORDER — FENTANYL CITRATE 0.05 MG/ML IJ SOLN
INTRAMUSCULAR | Status: AC
  Filled 2011-05-20: qty 2

## 2011-05-20 MED ORDER — CEFAZOLIN SODIUM-DEXTROSE 2-3 GM-% IV SOLR
INTRAVENOUS | Status: AC
  Filled 2011-05-20: qty 50

## 2011-05-20 MED ORDER — MIDAZOLAM HCL 5 MG/5ML IJ SOLN
INTRAMUSCULAR | Status: AC
  Filled 2011-05-20: qty 5

## 2011-05-20 MED ORDER — MUPIROCIN 2 % EX OINT
1.0000 "application " | TOPICAL_OINTMENT | Freq: Two times a day (BID) | CUTANEOUS | Status: DC
  Administered 2011-05-21: 1 via NASAL
  Filled 2011-05-20: qty 22

## 2011-05-20 MED ORDER — SODIUM CHLORIDE 0.9 % IV SOLN: INTRAVENOUS | Status: DC

## 2011-05-20 MED ORDER — FUROSEMIDE 40 MG PO TABS
40.0000 mg | ORAL_TABLET | Freq: Two times a day (BID) | ORAL | Status: DC
  Administered 2011-05-21: 40 mg via ORAL
  Filled 2011-05-20 (×4): qty 1

## 2011-05-20 MED ORDER — SUCRALFATE 1 G PO TABS
1.0000 g | ORAL_TABLET | Freq: Four times a day (QID) | ORAL | Status: DC
  Administered 2011-05-20 – 2011-05-21 (×2): 1 g via ORAL
  Filled 2011-05-20 (×7): qty 1

## 2011-05-20 MED ORDER — ONDANSETRON HCL 4 MG/2ML IJ SOLN: 4.0000 mg | Freq: Four times a day (QID) | INTRAMUSCULAR | Status: DC | PRN

## 2011-05-20 MED ORDER — WARFARIN - PHYSICIAN DOSING INPATIENT
Freq: Every day | Status: DC
  Administered 2011-05-20: 17:00:00

## 2011-05-20 MED ORDER — ATORVASTATIN CALCIUM 20 MG PO TABS
20.0000 mg | ORAL_TABLET | Freq: Every day | ORAL | Status: DC
  Filled 2011-05-20 (×2): qty 1

## 2011-05-20 MED ORDER — ACETAMINOPHEN-CODEINE #3 300-30 MG PO TABS
1.0000 | ORAL_TABLET | ORAL | Status: DC | PRN
  Administered 2011-05-20: 1 via ORAL
  Administered 2011-05-21 (×3): 2 via ORAL
  Filled 2011-05-20 (×2): qty 2
  Filled 2011-05-20: qty 1
  Filled 2011-05-20: qty 2

## 2011-05-20 MED ORDER — SODIUM CHLORIDE 0.9 % IR SOLN
Freq: Once | Status: DC
  Filled 2011-05-20: qty 2

## 2011-05-20 MED ORDER — POTASSIUM CHLORIDE CRYS ER 10 MEQ PO TBCR
10.0000 meq | EXTENDED_RELEASE_TABLET | Freq: Every day | ORAL | Status: DC
  Administered 2011-05-21: 10 meq via ORAL
  Filled 2011-05-20 (×2): qty 1

## 2011-05-20 MED ORDER — SODIUM CHLORIDE 0.45 % IV SOLN
INTRAVENOUS | Status: DC
  Administered 2011-05-20: 09:00:00 via INTRAVENOUS

## 2011-05-20 MED ORDER — CARVEDILOL 12.5 MG PO TABS
12.5000 mg | ORAL_TABLET | Freq: Every day | ORAL | Status: DC
  Administered 2011-05-21: 12.5 mg via ORAL
  Filled 2011-05-20 (×2): qty 1

## 2011-05-20 MED ORDER — HYDROXYZINE HCL 10 MG PO TABS
10.0000 mg | ORAL_TABLET | Freq: Two times a day (BID) | ORAL | Status: DC
  Administered 2011-05-21: 10 mg via ORAL
  Filled 2011-05-20 (×3): qty 1

## 2011-05-20 MED ORDER — AMIODARONE HCL 200 MG PO TABS
200.0000 mg | ORAL_TABLET | Freq: Every day | ORAL | Status: DC
  Administered 2011-05-21: 200 mg via ORAL
  Filled 2011-05-20: qty 1

## 2011-05-20 MED ORDER — LIDOCAINE HCL (PF) 1 % IJ SOLN
INTRAMUSCULAR | Status: AC
  Filled 2011-05-20: qty 60

## 2011-05-20 MED ORDER — LIDOCAINE HCL (PF) 1 % IJ SOLN
INTRAMUSCULAR | Status: AC
  Filled 2011-05-20: qty 30

## 2011-05-20 MED ORDER — FUROSEMIDE 40 MG PO TABS
40.0000 mg | ORAL_TABLET | Freq: Two times a day (BID) | ORAL | Status: DC
  Filled 2011-05-20 (×2): qty 1

## 2011-05-20 MED ORDER — WARFARIN SODIUM 5 MG PO TABS
5.0000 mg | ORAL_TABLET | ORAL | Status: DC
  Filled 2011-05-20: qty 1

## 2011-05-20 MED ORDER — GUAIFENESIN 200 MG PO TABS
800.0000 mg | ORAL_TABLET | Freq: Two times a day (BID) | ORAL | Status: DC
  Administered 2011-05-20 – 2011-05-21 (×2): 800 mg via ORAL
  Filled 2011-05-20 (×3): qty 4

## 2011-05-20 MED ORDER — CEFAZOLIN SODIUM-DEXTROSE 2-3 GM-% IV SOLR
2.0000 g | Freq: Three times a day (TID) | INTRAVENOUS | Status: DC
  Administered 2011-05-20 – 2011-05-21 (×2): 2 g via INTRAVENOUS
  Filled 2011-05-20 (×3): qty 50

## 2011-05-20 MED ORDER — ACYCLOVIR 800 MG PO TABS: 800.0000 mg | ORAL_TABLET | ORAL | Status: DC

## 2011-05-20 MED ORDER — CARVEDILOL 12.5 MG PO TABS
12.5000 mg | ORAL_TABLET | Freq: Two times a day (BID) | ORAL | Status: DC
  Filled 2011-05-20 (×2): qty 2

## 2011-05-20 MED ORDER — LISINOPRIL 10 MG PO TABS
10.0000 mg | ORAL_TABLET | Freq: Two times a day (BID) | ORAL | Status: DC
  Administered 2011-05-20 – 2011-05-21 (×2): 10 mg via ORAL
  Filled 2011-05-20 (×3): qty 1

## 2011-05-20 MED ORDER — ACETAMINOPHEN 325 MG PO TABS: 325.0000 mg | ORAL_TABLET | ORAL | Status: DC | PRN

## 2011-05-20 MED ORDER — CHLORHEXIDINE GLUCONATE 4 % EX LIQD
60.0000 mL | Freq: Once | CUTANEOUS | Status: DC
  Filled 2011-05-20: qty 60

## 2011-05-20 MED ORDER — LORATADINE 10 MG PO TABS
10.0000 mg | ORAL_TABLET | Freq: Every day | ORAL | Status: DC
  Administered 2011-05-21: 10 mg via ORAL
  Filled 2011-05-20: qty 1

## 2011-05-20 MED ORDER — PANTOPRAZOLE SODIUM 40 MG PO TBEC
40.0000 mg | DELAYED_RELEASE_TABLET | Freq: Every day | ORAL | Status: DC
  Administered 2011-05-21: 40 mg via ORAL
  Filled 2011-05-20: qty 1

## 2011-05-20 MED ORDER — CHLORHEXIDINE GLUCONATE CLOTH 2 % EX PADS
6.0000 | MEDICATED_PAD | Freq: Every day | CUTANEOUS | Status: DC
  Administered 2011-05-21: 6 via TOPICAL

## 2011-05-20 MED ORDER — WARFARIN SODIUM 2.5 MG PO TABS
2.5000 mg | ORAL_TABLET | ORAL | Status: DC
  Administered 2011-05-20: 2.5 mg via ORAL
  Filled 2011-05-20: qty 1

## 2011-05-20 MED ORDER — CARVEDILOL 25 MG PO TABS
25.0000 mg | ORAL_TABLET | Freq: Every day | ORAL | Status: DC
  Administered 2011-05-20: 25 mg via ORAL
  Filled 2011-05-20 (×2): qty 1

## 2011-05-20 MED ORDER — GUAIFENESIN 400 MG PO TABS
800.0000 mg | ORAL_TABLET | Freq: Two times a day (BID) | ORAL | Status: DC
Start: 2011-05-20 — End: 2011-05-20

## 2011-05-20 MED ORDER — WARFARIN SODIUM 2.5 MG PO TABS: 2.5000 mg | ORAL_TABLET | Freq: Every day | ORAL | Status: DC

## 2011-05-20 MED ORDER — OMEGA-3-ACID ETHYL ESTERS 1 G PO CAPS
1.0000 g | ORAL_CAPSULE | Freq: Two times a day (BID) | ORAL | Status: DC
  Administered 2011-05-21: 1 g via ORAL
  Filled 2011-05-20 (×4): qty 1

## 2011-05-20 NOTE — Progress Notes (Signed)
Patient having to stand to urinate.  This afternoon around 1545, I stood patient to urinate, upon placing patient back to bed noticed abd dressing with some blood stain and small hematoma.  [facility] PA notified.  Abd dressing blood stain marked.  Pressure held for about 5 minutes.  No more bleeding noted.  Patient insistent on standing at bedside for urinating.  Left upper chest dressing with minimal blood stain, same as upon initial assessment from cath lab. Will continue to monitor.  Colman Cater

## 2011-05-20 NOTE — Op Note (Signed)
Insertion of a new ICD system via left subclavian vein followed by removal of an old abdominal implanted ICD which had reached ERI. M#[id number].

## 2011-05-20 NOTE — Op Note (Signed)
NAMEEUGENE, ISADORE NO.:  [id number]  MEDICAL RECORD NO.:  [id number]  LOCATION:  3734                         FACILITY:  [facility]  PHYSICIAN:  Doylene Canning. [name], MD    DATE OF BIRTH:  12-07-1932  DATE OF PROCEDURE:  05/20/2011 DATE OF DISCHARGE:                              OPERATIVE REPORT   PROCEDURE PERFORMED:  Insertion of a new dual-chamber ICD with defibrillation threshold testing followed by explantation of an old abdominally implanted single-chamber defibrillator which had reached elective replacement.  INTRODUCTION:  The patient is a 78 year old male with ischemic cardiomyopathy, status post ICD insertion initially in 18.  He has had multiple abdominal change outs.  His current device which had reached elective replacement was found not had any additional connect ability. We looked at all 3 companies to see if they had suitable devices to utilize his 78 year old lead and none were available without adaptation. For this reason, he is now referred for insertion of a new device and removal of his old abdominally implanted device.  PROCEDURE:  After informed was obtained, the patient was taken to the diagnostic EP lab in a fasting state.  After usual preparation and draping, intravenous fentanyl and midazolam was given for sedation.  30 mL of lidocaine was infiltrated into the left infraclavicular region.  A 7 cm incision was carried out over this region.  Electrocautery was utilized to dissect down to the fascial plane.  The left subclavian vein was punctured, and the Medtronic model 6947, 65 cm active fixation defibrillation lead, serial number [id number] was advanced into the subclavian vein and then out to the right ventricle.  The Medtronic model 5076, 52 cm active fixation pacing lead serial number [id number] was advanced by way of the subclavian vein into the right atrium. Mapping was first carried out in the right ventricle at the final site on  the RV septum.  The R waves measured 12 mV, the pacing impedance was around 500 ohms, and the threshold was less than a V at 0.5 msec.  10 V pacing did not stimulate the diaphragm, and there was a large injury current with active fixation of the lead.  With the right ventricular lead in satisfactory position, attention then turned to placement of atrial lead, was placed in anterolateral portion of the right atrium where P-waves measured approximately 2 mV.  The pacing impedance was again around 500 ohms, and the threshold was a V at 0.5 msec.  10 V pacing did not stimulate the diaphragm.  Again, there was a large injury current.  With the atrial and ventricular leads in satisfactory position, they were secured to the subpectoral fascia with a figure-of- eight silk suture.  Sewing sleeve was secured with silk suture. Electrocautery was then utilized to make a subcutaneous pocket. Antibiotic irrigation was utilized to irrigate the pocket, and electrocautery was utilized to assure hemostasis.  At this point, the Medtronic secure dual-chamber ICD serial number [id number] was connected to the atrial and defibrillation lead and placed back in the subcutaneous pocket where it was secured with silk suture.  The pocket was then closed with 2-0 and 3-0 Vicryl.  Benzoin and Steri-Strips were painted on the  skin and a pressure dressing was applied.  At this point, the old defibrillator was targeted for removal.  Again after additional 30 mL of lidocaine was infiltrated into the left upper quadrant region,  a 9 cm incision was carried out over the left upper quadrant where the old ICD device had been placed previously. Electrocautery was utilized to dissect down to the ICD pocket, and the old Medtronic Marquis single chamber defibrillator was removed in total. The high voltage in low voltage leads were capped.  The lead was then placed back in the subcutaneous pocket.  The pocket was irrigated  with antibiotic irrigation and electrocautery was utilized to assure hemostasis, and the incision was closed with 2-0 and 3-0 Vicryl. Benzoin, Steri-Strips were painted on the skin and a pressure dressing was applied, and the patient was sedated for defibrillation threshold testing.  At this point, I scrubbed out of the case to directly supervise defibrillation threshold testing.  Under my direct supervision, additional fentanyl and Versed were delivered to obtain an adequate deep level of sedation.  Ventricular fibrillation was induced with a T-wave induction and a 20 J shock was delivered after appropriate sensing, restoring sinus rhythm.  Because of the satisfactory safety margin, no additional defibrillation threshold testing was carried out, and the patient was rather allowed to awaken and returned to the holding area in satisfactory condition.  COMPLICATIONS:  There were no immediate procedure complications.  RESULTS:  This demonstrates successful insertion of a new dual-chamber ICD followed by successful removal of a previously implanted abdominal implanted single-chamber ICD with defibrillation threshold testing.     Doylene Canning. [name], MD     GWT/MEDQ  D:  05/20/2011  T:  05/20/2011  Job:  [id number]  cc:   [name], M.D.

## 2011-05-20 NOTE — H&P (View-Only) (Signed)
  [name] Date of Birth: 07/12/1932   History of Present Illness: [name] is seen back today for followup. He reports that he is doing very well. He's had no significant chest pain. Once in a while at night he gets reflux and he states this messes up his breathing for a few minutes. He does note that his ICD has been beeping for the past week. According to his last ICD check he reached elective replacement indicator in mid February. He has only been taking his Lasix once a day. He complains that if he takes it twice a day it makes him urinate too much. He has been monitoring his weight and hasn't changed. His INR was checked today was only 1.5. He relates this to eating asparagus.  Current Outpatient Prescriptions on File Prior to Visit  Medication Sig Dispense Refill  . amiodarone (PACERONE) 200 MG tablet Take one tablet (200 mg) daily  135 tablet  3  . aspirin 81 MG tablet Take 81 mg by mouth daily. 2 DAILY       . calcium carbonate (TUMS - DOSED IN MG ELEMENTAL CALCIUM) 500 MG chewable tablet Chew 1 tablet by mouth as needed.        . carvedilol (COREG) 25 MG tablet 12.5MG IN THE AM, AND 25MG IN THE PM  60 tablet  6  . dexlansoprazole (DEXILANT) 60 MG capsule Take 60 mg by mouth daily.      . fish oil-omega-3 fatty acids 1000 MG capsule Take 2 g by mouth daily.      . fluticasone (FLONASE) 50 MCG/ACT nasal spray Place 2 sprays into the nose daily.        . furosemide (LASIX) 40 MG tablet Take 40 mg by mouth 2 (two) times daily.       . GuaiFENesin (MUCINEX PO) Take 800 mg by mouth 2 (two) times daily.        . HYDROcodone-acetaminophen (VICODIN) 5-500 MG per tablet Take 1 tablet by mouth every 6 (six) hours as needed.        . hydrOXYzine (ATARAX/VISTARIL) 10 MG tablet Take 10 mg by mouth 2 (two) times daily.      . KLOR-CON M20 20 MEQ tablet TAKE ONE-HALF TABLET BY MOUTH EVERY DAY  45 each  5  . lisinopril (PRINIVIL,ZESTRIL) 10 MG tablet Take 1 tablet (10 mg total) by mouth 2  (two) times daily.  60 tablet  5  . loratadine (CLARITIN) 10 MG tablet Take 10 mg by mouth daily.        . Mometasone Furo-Formoterol Fum (DULERA IN) Inhale into the lungs. 2 puffs TID       . omega-3 acid ethyl esters (LOVAZA) 1 G capsule Take 1 g by mouth 2 (two) times daily.       . Polyethyl Glycol-Propyl Glycol (SYSTANE OP) Apply to eye as needed.        . Polyethylene Glycol 3350 GRAN Take 1 packet by mouth 2 (two) times daily.        . psyllium (METAMUCIL) 58.6 % packet Take 1 packet by mouth daily.        . rosuvastatin (CRESTOR) 5 MG tablet Take 1 tablet (5 mg total) by mouth once a week.  30 tablet  5  . sucralfate (CARAFATE) 1 G tablet Take 1 g by mouth 4 (four) times daily.      . warfarin (COUMADIN) 5 MG tablet Take 1 tablet (5 mg total) by mouth daily.    45 tablet  3    Allergies  Allergen Reactions  . Celebrex (Celecoxib) Hives    Gi upset  . Digoxin   . Multaq (Dronedarone Hydrochloride)   . Nexium Hives    Past Medical History  Diagnosis Date  . Dizziness   . SOB (shortness of breath)   . Indigestion   . Difficulty walking   . Chronic back pain   . Atrial fibrillation   . Ischemic cardiomyopathy     WITH CHF  . CHF (congestive heart failure)     EF 35-40%  . Coronary artery disease   . VT (ventricular tachycardia)   . Hypertension   . Dyslipidemia   . Erythrocytosis   . GERD (gastroesophageal reflux disease)     Past Surgical History  Procedure Date  . Cardiac defibrillator placement   . Coronary artery bypass graft 1983  . Cholecystectomy   . Esophagogastroduodenoscopy 02/11/2011    Procedure: ESOPHAGOGASTRODUODENOSCOPY (EGD);  Surgeon: Patrick D Hung, MD;  Location: [facility];  Service: Endoscopy;  Laterality: N/A;  . Cardiac catheterization 10/17/06    History  Smoking status  . Former Smoker  . Quit date: 06/23/1976  Smokeless tobacco  . Not on file    History  Alcohol Use No    Family History  Problem Relation Age of Onset  .  Heart disease    . Hypertension    . Stroke    . Diabetes    . Heart disease Mother   . Heart disease Father     Review of Systems: As noted in history of present illness. All other systems were reviewed and are negative.  Physical Exam: BP 122/78  Pulse 60  Ht 6' (1.829 m)  Wt 183 lb (83.008 kg)  BMI 24.82 kg/m2  Patient is pleasant and in no acute distress. He does appear chronically ill. Skin is warm and dry. Color is chronically sallow.  HEENT is unremarkable. Normocephalic/atraumatic. PERRL. Sclera are nonicteric. Neck is supple. No masses. No JVD. Lungs are  clear. Cardiac exam shows a regular rate and rhythm. Abdomen is soft. Extremities are without edema. Gait and ROM are intact. No gross neurologic deficits noted.   LABORATORY DATA:    Assessment / Plan:  

## 2011-05-20 NOTE — Interval H&P Note (Signed)
History and Physical Interval Note:  05/20/2011 9:34 AM  [name]  has presented today for surgery, with the diagnosis of End of life  The various methods of treatment have been discussed with the patient and family. After consideration of risks, benefits and other options for treatment, the patient has consented to  Procedure(s) (LRB): ICD GENERATOR CHANGE (N/A) as a surgical intervention with insertion of a new ICD system and removal of his old epicardial ICD generator .  The patients' history has been reviewed, patient examined, no change in status, stable for surgery.  I have reviewed the patients' chart and labs.  Questions were answered to the patient's satisfaction.     Lewayne Bunting

## 2011-05-21 ENCOUNTER — Encounter (HOSPITAL_COMMUNITY): Payer: Self-pay | Admitting: Physician Assistant

## 2011-05-21 ENCOUNTER — Ambulatory Visit (HOSPITAL_COMMUNITY): Payer: Medicare Other

## 2011-05-21 ENCOUNTER — Other Ambulatory Visit: Payer: Self-pay

## 2011-05-21 DIAGNOSIS — I472 Ventricular tachycardia: Secondary | ICD-10-CM | POA: Diagnosis not present

## 2011-05-21 MED ORDER — ASPIRIN 81 MG PO TABS: 162.0000 mg | ORAL_TABLET | Freq: Every day | ORAL | Status: DC

## 2011-05-21 MED ORDER — WARFARIN SODIUM 5 MG PO TABS: 5.0000 mg | ORAL_TABLET | ORAL | Status: DC

## 2011-05-21 MED ORDER — WARFARIN SODIUM 5 MG PO TABS: 2.5000 mg | ORAL_TABLET | Freq: Every day | ORAL | Status: DC

## 2011-05-21 NOTE — Discharge Summary (Signed)
Discharge Summary   Patient ID: [name] MRN: [id number], DOB/AGE: Apr 30, 1932 78 y.o. Admit date: 05/20/2011 D/C date:     05/21/2011   Primary Discharge Diagnoses:  1. Ischemic cardiomyopathy with history of CHF/VT - s/p ICD generator change-out with [employer]onic dual-chamber ICD 05/20/11 with explantation of previous abdominally-implanted device  Secondary Discharge Diagnoses:  1. Coronary artery disease s/p CABG 1983 and PCI/stent 2004.  2. Atrial fibrillation 3. VT 4. HTN 5. Dyslipidemia 6. Erythrocytosis in the past treated with phlebotomy 7. GERD 8. Abnormal thyroid imaging studies from 11/09/2010, status post ultrasound guided fine needle aspiration of the dominant left inferior thyroid nodule on 12/15/2010. Cytology report showed rare follicular epithelial cells and hemosiderin laden macrophages. 9. Diet controlled DM  Hospital Course: 78 y/o M with hx of CAD, ICM, VT presented to Dr.Jordan's office for follow-up. He had been doing well without CP or SOB. He reported his ICD had been beeping for the last week and was found to be at [facility]. He denies any weight changes. He reported only taking his Lasix once daily as he said if he takes it twice a day it makes him urinate too much. He had a recent low INR which he attributed to asparagus. He was admitted 4/19 for generator change-out and had successful insertion of a new dual-chamber ICD with defibrillation threshold testing followed by explantation of an old abdominally implanted single-chamber defibrillator. He tolerated this procedure well. He did have some bleeding after straining to urinate last night so pressure dressing was left in place, and will be left in place at discharge per Dr. Patty Sermons until Monday 05/23/11. F/u CXR today shows no pneumothorax. Device has been interrogated by Medtr[employer] and is functioning well. The patient was seen and examined today and felt stable for discharge by Dr. Patty Sermons. He would like the patient  to hold ASA/Coumadin today and restart tomorrow, and continue other home meds. The patient clarified for me his still on amiodarone at home, and is no longer taking acyclovir as originally reported by med rec.  Discharge Vitals: Blood pressure 112/63, pulse 74, temperature 98.6 F (37 C), temperature source Oral, resp. rate 18, height 6' (1.829 m), weight 180 lb (81.647 kg), SpO2 97.00%.  Labs: Lab Results  Component Value Date   WBC 6.3 05/10/2011   HGB 13.0 05/10/2011   HCT 39.4 05/10/2011   MCV 86.0 05/10/2011   PLT 168.0 05/10/2011    Diagnostic Studies/Procedures   1. Chest 2 View 05/21/2011  *RADIOLOGY REPORT*  Clinical Data: Post AICD insertion  CHEST - 2 VIEW  Comparison: 11/30/2010; 11/27/2010; chest CT - 11/27/2010  Findings: Examination is degraded secondary to that of bilateral costophrenic angles on both the AP and lateral radiographs. Interval placement of a left anterior chest wall dual lead AICD / pacemaker with lead tips overlying the central location of the right atrium and ventricle.  Grossly unchanged positioning of multiple epicardial pacer leads.  Interval decrease in small bilateral pleural effusions.  The lungs appear hyperinflated.  No new focal airspace opacities.  No definite pneumothorax.  No definite evidence of pulmonary edema.  Grossly unchanged bones.  IMPRESSION: 1.  Interval placement of a left anterior chest wall AICD / pacemaker without evidence of complication. 2.  Interval decrease in previously identified small bilateral effusions.  Original Report Authenticated By: Waynard Reeds, M.D.    Discharge Medications   Medication List  As of 05/21/2011  1:08 PM   TAKE these medications  amiodarone 200 MG tablet   Commonly known as: PACERONE   Take 200 mg by mouth daily.      aspirin 81 MG tablet   Take 2 tablets (162 mg total) by mouth daily.      calcium carbonate 500 MG chewable tablet   Commonly known as: TUMS - dosed in mg elemental calcium   Chew 1  tablet by mouth as needed. For heart burn      carvedilol 25 MG tablet   Commonly known as: COREG   Take 12.5-25 mg by mouth 2 (two) times daily with a meal. 12.5mg  in the morning and 25mg  in the evening      DEXILANT 60 MG capsule   Generic drug: dexlansoprazole   Take 60 mg by mouth daily.      DULERA IN   Inhale 2 puffs into the lungs 3 (three) times daily.      fish oil-omega-3 fatty acids 1000 MG capsule   Take 2 g by mouth daily.      fluticasone 50 MCG/ACT nasal spray   Commonly known as: FLONASE   Place 2 sprays into the nose daily.      furosemide 40 MG tablet   Commonly known as: LASIX   Take 40 mg by mouth 2 (two) times daily.      guaifenesin 400 MG Tabs   Commonly known as: HUMIBID E   Take 800 mg by mouth 2 (two) times daily.      HYDROcodone-acetaminophen 5-500 MG per tablet   Commonly known as: VICODIN   Take 1 tablet by mouth every 6 (six) hours as needed. For pain      hydrOXYzine 10 MG tablet   Commonly known as: ATARAX/VISTARIL   Take 10 mg by mouth 2 (two) times daily.      lisinopril 10 MG tablet   Commonly known as: PRINIVIL,ZESTRIL   Take 10 mg by mouth 2 (two) times daily.      loratadine 10 MG tablet   Commonly known as: CLARITIN   Take 10 mg by mouth daily.      omega-3 acid ethyl esters 1 G capsule   Commonly known as: LOVAZA   Take 1 g by mouth 2 (two) times daily.      polyethylene glycol packet   Commonly known as: MIRALAX / GLYCOLAX   Take 17 g by mouth 2 (two) times daily as needed. For constipation      potassium chloride SA 20 MEQ tablet   Commonly known as: K-DUR,KLOR-CON   Take 10 mEq by mouth daily.      psyllium 58.6 % packet   Commonly known as: METAMUCIL   Take 1 packet by mouth daily.      rosuvastatin 5 MG tablet   Commonly known as: CRESTOR   Take 5 mg by mouth once a week.      sucralfate 1 G tablet   Commonly known as: CARAFATE   Take 1 g by mouth 4 (four) times daily.      SYSTANE OP   Place 1 drop  into both eyes as needed. For dry eyes      warfarin 5 MG tablet   Commonly known as: COUMADIN   Take 0.5-1 tablets (2.5-5 mg total) by mouth daily. IMPORTANT: Do not restart until Sunday 05/22/11. At that time, you can restart taking your previous dose of 5mg  Tuesday, Saturday, Sunday; 2.5mg  Monday, Wednesday, Thursday, Friday.          it  does not populate here, but was instructed to hold ASA until tomorrow.  Disposition   The patient will be discharged in stable condition to home. Discharge Orders    Future Appointments: Provider: Department: Dept Phone: Center:   05/24/2011 9:30 AM [facility] [phone] None   05/31/2011 9:30 AM Beverley Fiedler, MD [facility] [place] Office [phone] [facility]   06/30/2011 9:30 AM Dava Najjar Idelle Jo [facility] [phone] None   06/30/2011 10:00 AM Samul Dada, MD [facility] [phone] None   08/11/2011 10:00 AM Peter M [place], MD [facility] [phone] None     Future Orders Please Complete By Expires   Diet - low sodium heart healthy      Increase activity slowly      Comments:   Please see attached sheet for instructions on wound care, activity, and bathing.       Follow-up Information    Follow up with Peter [place], MD. (Dr. Elvis Coil office will call you for follow-up appointments with him and Dr. [name].  We will try to arrange for you to come in on Monday for your Coumadin level check and to have someone look at your abdomen site - expect a call Monday.)    Contact information:   [address], [address] [place] [address] [phone]            Duration of Discharge Encounter: Greater than 30 minutes including physician and PA time.  Signed, Abhijot Straughter PA-C 05/21/2011, 1:08 PM

## 2011-05-21 NOTE — Progress Notes (Signed)
Subjective:  The patient is doing well after his ICD changeout yesterday.  An old abdominally-implanted device was removed and the epicardial lead was capped and left in the pocket. He has a new system installed through the left subclavian system.  He feels well.  Device has been interrogated by [employer] and is functioning well.  Telemetry shows atrial pacing.  Chest xray today shows no pneumothorax.  Objective:  Vital Signs in the last 24 hours: Temp:  [97.1 F (36.2 C)-98.6 F (37 C)] 98.6 F (37 C) (04/20 0600) Pulse Rate:  [55-74] 74  (04/20 0915) Resp:  [16-18] 18  (04/20 0600) BP: (90-119)/(45-67) 112/63 mmHg (04/20 1120) SpO2:  [97 %] 97 % (04/20 0600)  Intake/Output from previous day: 04/19 0701 - 04/20 0700 In: -  Out: 600 [Urine:600] Intake/Output from this shift: Total I/O In: 260 [P.O.:260] Out: -      . amiodarone  200 mg Oral Daily  . atorvastatin  20 mg Oral q1800  . carvedilol  12.5 mg Oral QAC breakfast  . carvedilol  25 mg Oral Q supper  . ceFAZolin      .  ceFAZolin (ANCEF) IV  2 g Intravenous Q8H  . Chlorhexidine Gluconate Cloth  6 each Topical Q0600  . furosemide  40 mg Oral BID  . guaiFENesin  800 mg Oral BID  . hydrOXYzine  10 mg Oral BID  . lisinopril  10 mg Oral BID  . loratadine  10 mg Oral Daily  . mupirocin ointment  1 application Nasal BID  . omega-3 acid ethyl esters  1 g Oral BID  . pantoprazole  40 mg Oral Q1200  . potassium chloride SA  10 mEq Oral Daily  . sucralfate  1 g Oral QID  . warfarin  2.5 mg Oral Custom  . warfarin  5 mg Oral Custom  . Warfarin - Physician Dosing Inpatient   Does not apply q1800  . DISCONTD: acyclovir  800 mg Oral UD  . DISCONTD: carvedilol  12.5-25 mg Oral BID WC  . DISCONTD:  ceFAZolin (ANCEF) IV  2 g Intravenous 60 min Pre-Op  . DISCONTD: chlorhexidine  60 mL Topical Once  . DISCONTD: furosemide  40 mg Oral BID  . DISCONTD: gentamicin irrigation  80 mg Irrigation On Call  . DISCONTD: gentamicin  irrigation   Irrigation Once  . DISCONTD: guaifenesin  800 mg Oral BID  . DISCONTD: warfarin  2.5-5 mg Oral Daily      . DISCONTD: sodium chloride 50 mL/hr at 05/20/11 0843  . DISCONTD: sodium chloride      Physical Exam: The patient appears to be in no distress.  Head and neck exam reveals that the pupils are equal and reactive.  The extraocular movements are full.  There is no scleral icterus.  Mouth and pharynx are benign.  No lymphadenopathy.  No carotid bruits.  The jugular venous pressure is normal.  Thyroid is not enlarged or tender.  Chest is clear to percussion and auscultation.  No rales or rhonchi.  Expansion of the chest is symmetrical. The new ICD pocket in left upper chest looks fine.  Heart reveals no abnormal lift or heave.  First and second heart sounds are normal.  There is no murmur gallop rub or click.  The abdomen is soft and nontender.  Bowel sounds are normoactive.  There is no hepatosplenomegaly or mass.  There are no abdominal bruits. The LUQ site of removal of old pacer shows mild oozing of blood  so will leave dressing on for another 2 days.  INR today 1.62  Extremities reveal no phlebitis or edema.  Pedal pulses are good.  There is no cyanosis or clubbing.  Neurologic exam is normal strength and no lateralizing weakness.  No sensory deficits.  Integument reveals no rash  Lab Results: No results found for this basename: WBC:2,HGB:2,PLT:2 in the last 72 hours No results found for this basename: NA:2,K:2,CL:2,CO2:2,GLUCOSE:2,BUN:2,CREATININE:2 in the last 72 hours No results found for this basename: TROPONINI:2,CK,MB:2 in the last 72 hours Hepatic Function Panel No results found for this basename: PROT,ALBUMIN,AST,ALT,ALKPHOS,BILITOT,BILIDIR,IBILI in the last 72 hours No results found for this basename: CHOL in the last 72 hours No results found for this basename: PROTIME in the last 72 hours  Imaging: Dg Chest 2 View  05/21/2011  *RADIOLOGY REPORT*   Clinical Data: Post AICD insertion  CHEST - 2 VIEW  Comparison: 11/30/2010; 11/27/2010; chest CT - 11/27/2010  Findings: Examination is degraded secondary to that of bilateral costophrenic angles on both the AP and lateral radiographs. Interval placement of a left anterior chest wall dual lead AICD / pacemaker with lead tips overlying the central location of the right atrium and ventricle.  Grossly unchanged positioning of multiple epicardial pacer leads.  Interval decrease in small bilateral pleural effusions.  The lungs appear hyperinflated.  No new focal airspace opacities.  No definite pneumothorax.  No definite evidence of pulmonary edema.  Grossly unchanged bones.  IMPRESSION: 1.  Interval placement of a left anterior chest wall AICD / pacemaker without evidence of complication. 2.  Interval decrease in previously identified small bilateral effusions.  Original Report Authenticated By: Waynard Reeds, M.D.    Cardiac Studies: Telemetry stable. Assessment/Plan:   ICD Generator implantation doing well.  Site of old generator explant in LUQ still showing some oozing.  Plan: Okay for discharge today. Hold warfarin today and restart Sunday. Keep dressing on abdominal wound until Monday.   LOS: 1 day    Cassell Clement 05/21/2011, 12:24 PM

## 2011-05-21 NOTE — Discharge Instructions (Signed)
   Supplemental Discharge Instructions for  Defibrillator Patients  Activity No heavy lifting or vigorous activity with your left/right arm for 6 to 8 weeks.  Do not raise your left/right arm above your head for one week.  Gradually raise your affected arm as drawn below.            05/25/11                         05/26/11                       05/27/11                     05/28/11  NO DRIVING for 1 week, may not resume unless cleared by Dr. [place] WOUND CARE   PLEASE LEAVE YOUR ABDOMEN PRESSURE DRESSING ON until Monday 05/23/11.   Keep the wound area clean and dry.  Do not get this area wet for one week. No showers for one week; you may shower on 05/28/11   The tape/steri-strips on your wound will fall off; do not pull them off.  No bandage is needed on the site.  DO  NOT apply any creams, oils, or ointments to the wound area.   If you notice any drainage or discharge from the wound, any swelling or bruising at the site, or you develop a fever > 101? F after you are discharged home, call the office at once.  Special Instructions   You are still able to use cellular telephones; use the ear opposite the side where you have your pacemaker/defibrillator.  Avoid carrying your cellular phone near your device.   When traveling through airports, show security personnel your identification card to avoid being screened in the metal detectors.  Ask the security personnel to use the hand wand.   Avoid arc welding equipment, MRI testing (magnetic resonance imaging), TENS units (transcutaneous nerve stimulators).  Call the office for questions about other devices.   Avoid electrical appliances that are in poor condition or are not properly grounded.   Microwave ovens are safe to be near or to operate.  Additional information for defibrillator patients should your device go off:   If your device goes off ONCE and you feel fine afterward, notify the device clinic nurses.   If your device goes off ONCE and you  do not feel well afterward, call 911.   If your device goes off TWICE, call 911.   If your device goes off THREE times in one day, call 911.  DO NOT DRIVE YOURSELF OR A FAMILY MEMBER WITH A DEFIBRILLATOR TO THE HOSPITAL--CALL 911.

## 2011-05-23 ENCOUNTER — Ambulatory Visit (INDEPENDENT_AMBULATORY_CARE_PROVIDER_SITE_OTHER): Payer: Medicare Other | Admitting: *Deleted

## 2011-05-23 DIAGNOSIS — I4891 Unspecified atrial fibrillation: Secondary | ICD-10-CM | POA: Diagnosis not present

## 2011-05-30 ENCOUNTER — Encounter: Payer: Self-pay | Admitting: Internal Medicine

## 2011-05-30 ENCOUNTER — Ambulatory Visit (INDEPENDENT_AMBULATORY_CARE_PROVIDER_SITE_OTHER): Payer: Medicare Other | Admitting: *Deleted

## 2011-05-30 DIAGNOSIS — I472 Ventricular tachycardia: Secondary | ICD-10-CM

## 2011-05-30 DIAGNOSIS — I498 Other specified cardiac arrhythmias: Secondary | ICD-10-CM | POA: Diagnosis not present

## 2011-05-30 DIAGNOSIS — R001 Bradycardia, unspecified: Secondary | ICD-10-CM

## 2011-05-30 LAB — ICD DEVICE OBSERVATION
AL IMPEDENCE ICD: 437 Ohm
CHARGE TIME: 3.643 s
DEV-0020ICD: NEGATIVE
PACEART VT: 0
TOT-0001: 1
TOT-0002: 0
TOT-0006: 20130419000000
TZAT-0001ATACH: 1
TZAT-0001ATACH: 3
TZAT-0001FASTVT: 1
TZAT-0001SLOWVT: 1
TZAT-0001SLOWVT: 2
TZAT-0002ATACH: NEGATIVE
TZAT-0002ATACH: NEGATIVE
TZAT-0002FASTVT: NEGATIVE
TZAT-0005SLOWVT: 88 pct
TZAT-0005SLOWVT: 91 pct
TZAT-0012ATACH: 150 ms
TZAT-0013SLOWVT: 3
TZAT-0013SLOWVT: 4
TZAT-0018ATACH: NEGATIVE
TZAT-0018ATACH: NEGATIVE
TZAT-0018SLOWVT: NEGATIVE
TZAT-0018SLOWVT: NEGATIVE
TZAT-0019ATACH: 6 V
TZAT-0019ATACH: 6 V
TZAT-0019SLOWVT: 8 V
TZON-0004SLOWVT: 28
TZON-0005SLOWVT: 12
TZST-0001ATACH: 5
TZST-0001ATACH: 6
TZST-0001FASTVT: 3
TZST-0001FASTVT: 4
TZST-0001SLOWVT: 3
TZST-0001SLOWVT: 4
TZST-0001SLOWVT: 6
TZST-0002ATACH: NEGATIVE
TZST-0002ATACH: NEGATIVE
TZST-0002FASTVT: NEGATIVE
TZST-0002FASTVT: NEGATIVE
TZST-0002FASTVT: NEGATIVE
TZST-0002FASTVT: NEGATIVE
TZST-0003SLOWVT: 35 J
VENTRICULAR PACING ICD: 0.12 pct

## 2011-05-30 NOTE — Progress Notes (Signed)
Wound check-ICD 

## 2011-05-31 ENCOUNTER — Ambulatory Visit (INDEPENDENT_AMBULATORY_CARE_PROVIDER_SITE_OTHER): Payer: Medicare Other | Admitting: Internal Medicine

## 2011-05-31 ENCOUNTER — Encounter: Payer: Self-pay | Admitting: Internal Medicine

## 2011-05-31 ENCOUNTER — Telehealth: Payer: Self-pay | Admitting: Gastroenterology

## 2011-05-31 VITALS — BP 110/60 | HR 64 | Ht 72.0 in | Wt 185.4 lb

## 2011-05-31 DIAGNOSIS — R195 Other fecal abnormalities: Secondary | ICD-10-CM

## 2011-05-31 DIAGNOSIS — K219 Gastro-esophageal reflux disease without esophagitis: Secondary | ICD-10-CM | POA: Diagnosis not present

## 2011-05-31 DIAGNOSIS — R1013 Epigastric pain: Secondary | ICD-10-CM | POA: Insufficient documentation

## 2011-05-31 MED ORDER — PEG-KCL-NACL-NASULF-NA ASC-C 100 G PO SOLR: 1.0000 | Freq: Once | ORAL | Status: DC

## 2011-05-31 NOTE — Progress Notes (Signed)
Subjective:    Patient ID: [name], male    DOB: 02/04/1932, 78 y.o.   MRN: [id number]  HPI [name] is a 78 yo male with PMH of CHF with ICD in place, afib on chronic warfarin, anemia,  CAD status post CABG who is seen in consultation at the request of Dr. Felipa Eth for evaluation of heme positive stool.  He has been followed, for some time by Dr. Jeani Hawking but requested to switch to our practice. He is alone today. His primary reason for being seen today is heme positive stool. He reports remote colonoscopy, likely greater than 10 years ago performed by Dr. Virginia Rochester. He does not recall history of polyps. He has not seen any blood in his stool and he denies melena. Bowel habits are unchanged, and he does continue to experience some constipation. He is on MiraLAX once per day and when he takes this his bowel movements are regular and not hard. He had a positive FOBT which was performed in February 2013.  He does have a history of epigastric pain, but this is well-controlled at present. He remains on Dexilant 60 mg daily. He reports when he uses this medication he has no trouble with epigastric pain or reflux. His epigastric pain is worse with foods such as tomatoes and greasy foods.  He denies nausea and vomiting. No early satiety. He has lost about 20 pounds, but this is volitional.  He had an upper endoscopy performed in January 2013 by Dr. Elnoria Howard. This was normal except for a distal esophageal vascular blebs and possible portal hypertensive gastropathy. He was given a prescription for sucralfate which he took for some time, but has discontinued. He discontinued this with no return of epigastric pain  Review of Systems As per history of present illness, otherwise negative  Patient Active Problem List  Diagnoses  . VENTRICULAR TACHYCARDIA  . ATRIAL FIBRILLATION  . Chronic systolic heart failure  . AUTOMATIC IMPLANTABLE CARDIAC DEFIBRILLATOR SITU  . CAD (coronary artery disease)  . Chronic  anticoagulation  . Bradycardia  . Anemia, iron deficiency   Past Surgical History  Procedure Date  . Cardiac defibrillator placement   . Coronary artery bypass graft 1983  . Cholecystectomy   . Esophagogastroduodenoscopy 02/11/2011    Procedure: ESOPHAGOGASTRODUODENOSCOPY (EGD);  Surgeon: Theda Belfast, MD;  Location: [facility];  Service: Endoscopy;  Laterality: N/A;  . Cardiac catheterization 10/17/06  . Angioplasty     stent placement   Current Outpatient Prescriptions  Medication Sig Dispense Refill  . amiodarone (PACERONE) 200 MG tablet Take 200 mg by mouth daily.      Marland Kitchen aspirin 81 MG tablet Take 2 tablets (162 mg total) by mouth daily.      . calcium carbonate (TUMS - DOSED IN MG ELEMENTAL CALCIUM) 500 MG chewable tablet Chew 1 tablet by mouth as needed. For heart burn      . carvedilol (COREG) 25 MG tablet Take 12.5-25 mg by mouth 2 (two) times daily with a meal. 12.5mg  in the morning and 25mg  in the evening      . dexlansoprazole (DEXILANT) 60 MG capsule Take 60 mg by mouth daily.      . fish oil-omega-3 fatty acids 1000 MG capsule Take 2 g by mouth daily.      . fluticasone (FLONASE) 50 MCG/ACT nasal spray Place 2 sprays into the nose daily.       . furosemide (LASIX) 40 MG tablet Take 40 mg by mouth 2 (two)  times daily.       Marland Kitchen guaifenesin (HUMIBID E) 400 MG TABS Take 800 mg by mouth 2 (two) times daily.      Marland Kitchen HYDROcodone-acetaminophen (VICODIN) 5-500 MG per tablet Take 1 tablet by mouth every 6 (six) hours as needed. For pain      . lisinopril (PRINIVIL,ZESTRIL) 10 MG tablet Take 10 mg by mouth 2 (two) times daily.      Marland Kitchen loratadine (CLARITIN) 10 MG tablet Take 10 mg by mouth daily.       . Mometasone Furo-Formoterol Fum (DULERA IN) Inhale 2 puffs into the lungs 3 (three) times daily.       Marland Kitchen omega-3 acid ethyl esters (LOVAZA) 1 G capsule Take 1 g by mouth 2 (two) times daily.       . peg 3350 powder (MOVIPREP) 100 G SOLR Take 1 kit (100 g total) by mouth once.  1 kit  0    . Polyethyl Glycol-Propyl Glycol (SYSTANE OP) Place 1 drop into both eyes as needed. For dry eyes      . polyethylene glycol (MIRALAX / GLYCOLAX) packet Take 17 g by mouth 2 (two) times daily as needed. For constipation      . potassium chloride SA (K-DUR,KLOR-CON) 20 MEQ tablet Take 10 mEq by mouth daily.      . psyllium (METAMUCIL) 58.6 % packet Take 1 packet by mouth daily.       . rosuvastatin (CRESTOR) 5 MG tablet Take 5 mg by mouth once a week.      . warfarin (COUMADIN) 5 MG tablet Take 0.5-1 tablets (2.5-5 mg total) by mouth daily. IMPORTANT: Do not restart until Sunday 05/22/11. At that time, you can restart taking your previous dose of 5mg  Tuesday, Saturday, Sunday; 2.5mg  Monday, Wednesday, Thursday, Friday.       Allergies  Allergen Reactions  . Celebrex (Celecoxib) Hives    Gi upset  . Digoxin   . Esomeprazole Magnesium Hives  . Multaq (Dronedarone Hydrochloride)    Family History  Problem Relation Age of Onset  . Heart disease Brother   . Diabetes Sister   . Diabetes Brother   . Tuberculosis Mother   . Tuberculosis Father   . Clotting disorder Brother    History  Substance Use Topics  . Smoking status: Former Smoker    Quit date: 06/23/1976  . Smokeless tobacco: Never Used  . Alcohol Use: No      Objective:   Physical Exam BP 110/60  Pulse 64  Ht 6' (1.829 m)  Wt 185 lb 6 oz (84.086 kg)  BMI 25.14 kg/m2 Constitutional: Well-developed and well-nourished, elderly appearing. No distress. HEENT: Normocephalic and atraumatic. Oropharynx is clear and moist. No oropharyngeal exudate. Conjunctivae are normal. Pupils are equal round and reactive to light. No scleral icterus. Neck: Neck supple. Trachea midline. Cardiovascular: Normal rate, regular rhythm and intact distal pulses. No M/R/G,  defibrillator with well healing incision left upper chest there is minimal ecchymosis over this device Pulmonary/chest: Effort normal and breath sounds normal. No wheezing, rales or  rhonchi. Abdominal: Soft, nontender, nondistended. Bowel sounds active throughout. There are no masses palpable. No hepatosplenomegaly. Previous location of implantable defibrillator left middle to lower abdomen with palpable scar Extremities: no clubbing, cyanosis, or edema Lymphadenopathy: No cervical adenopathy noted. Neurological: Alert and oriented to person place and time. Skin: Skin is warm and dry. No rashes noted. Psychiatric: Normal mood and affect. Behavior is normal.  CBC    Component Value Date/Time  WBC 6.3 05/10/2011 1232   WBC 7.5 04/29/2011 0901   RBC 4.58 05/10/2011 1232   RBC 4.66 04/29/2011 0901   HGB 13.0 05/10/2011 1232   HGB 13.0 04/29/2011 0901   HCT 39.4 05/10/2011 1232   HCT 40.2 04/29/2011 0901   PLT 168.0 05/10/2011 1232   PLT 185 04/29/2011 0901   MCV 86.0 05/10/2011 1232   MCV 86.3 04/29/2011 0901   MCH 27.9 04/29/2011 0901   MCH 24.0* 11/30/2010 0610   MCHC 33.1 05/10/2011 1232   MCHC 32.3 04/29/2011 0901   RDW 21.0* 05/10/2011 1232   RDW 20.3* 04/29/2011 0901   LYMPHSABS 1.4 05/10/2011 1232   LYMPHSABS 1.6 04/29/2011 0901   MONOABS 1.1* 05/10/2011 1232   MONOABS 1.3* 04/29/2011 0901   EOSABS 0.1 05/10/2011 1232   EOSABS 0.1 04/29/2011 0901   BASOSABS 0.0 05/10/2011 1232   BASOSABS 0.0 04/29/2011 0901    CMP     Component Value Date/Time   NA 138 05/10/2011 1232   K 4.6 05/10/2011 1232   CL 102 05/10/2011 1232   CO2 28 05/10/2011 1232   GLUCOSE 94 05/10/2011 1232   BUN 24* 05/10/2011 1232   CREATININE 1.0 05/10/2011 1232   CALCIUM 8.8 05/10/2011 1232   PROT 6.7 02/28/2011 1147   ALBUMIN 4.2 02/28/2011 1147   AST 46* 02/28/2011 1147   ALT 34 02/28/2011 1147   ALKPHOS 93 02/28/2011 1147   BILITOT 0.8 02/28/2011 1147   GFRNONAA 62* 11/30/2010 0610   GFRAA 72* 11/30/2010 0610    Iron/TIBC/Ferritin    Component Value Date/Time   IRON 56 04/29/2011 0901   TIBC 334 04/29/2011 0901   FERRITIN 26 04/29/2011 0901      Assessment & Plan:  78 yo male with PMH of CHF with ICD in place, afib  on chronic warfarin, anemia,  CAD status post CABG who is seen in consultation at the request of Dr. Felipa Eth for evaluation of heme positive stool  1. + FOBT -- the patient has borderline low iron stores, but at present is not anemic. He did have positive FOBT from February we have discussed these results. He's had a recent upper endoscopy, and a logical next step would be colonoscopy. We discussed this in detail including the risks and benefits, taking into account his chronic comorbidities. After discussion he would like to proceed with colonoscopy, and I feel that this is safe and reasonable. His warfarin will need to be held and we will contact cardiology in the Coumadin clinic regarding this medication and possible Lovenox bridge. I do not feel the upper endoscopy needs to be repeated at this time.  2. Epigastric pain/GERD -- symptoms well controlled on Dexilant 60 mg daily. He'll continue to use this on a daily basis. I do not see a need for sucralfate at present

## 2011-05-31 NOTE — Telephone Encounter (Signed)
  05/31/2011    RE: [name] DOB: [birth year] MRN: [id number]   Dear Dr. Swaziland,    We have scheduled the above patient for a Colonoscopy. Our records show that he is on anticoagulation therapy.   Please advise as to how long the patient may come off his therapy of Coumadin prior to the procedure, which is scheduled for 07/08/2011.  Please fax back/ or route the completed form to [facility] at [phone].   Sincerely,  [place], Raford Pitcher

## 2011-05-31 NOTE — Patient Instructions (Signed)
You have been scheduled for a colonoscopy at [facility] at 9:00am Please arrive at 8:00 . Please follow written instructions given to you at your visit today.  Please pick up your prep kit at the pharmacy within the next 1-3 days.  We have sent the following medications to your pharmacy for you to pick up at your convenience: Moviprep; please follow instructions you were given today.   You will hear from our office regarding your coumadin. If you do not hear from [place] by 06/25/2011 please call us at [phone].   Marland Kitchen

## 2011-06-01 ENCOUNTER — Telehealth: Payer: Self-pay | Admitting: Gastroenterology

## 2011-06-01 NOTE — Telephone Encounter (Signed)
Spoke to pt. Told him per Dr. Swaziland, he can hold his coumadin 5 days prior to colonoscopy, Starting July 03, 2011. Pt stated understanding.   Mr. Geter has been in sinus rhythm on amiodarone. I think his coumadin can be held 5 days prior to colonoscopy without bridging Lovenox. Thank you. Peter Swaziland MD, [facility]

## 2011-06-03 ENCOUNTER — Other Ambulatory Visit: Payer: Self-pay | Admitting: Cardiology

## 2011-06-03 ENCOUNTER — Other Ambulatory Visit: Payer: Self-pay | Admitting: Cardiovascular Disease

## 2011-06-06 ENCOUNTER — Ambulatory Visit (INDEPENDENT_AMBULATORY_CARE_PROVIDER_SITE_OTHER): Payer: Medicare Other | Admitting: Pharmacist

## 2011-06-06 DIAGNOSIS — I4891 Unspecified atrial fibrillation: Secondary | ICD-10-CM

## 2011-06-06 NOTE — Telephone Encounter (Signed)
Elnita Maxwell Could you please check to see this patient has this med refill. I did not see an office note where this patient had seen Dr Swaziland except in the hospital. Thanks Okey Dupre

## 2011-06-07 DIAGNOSIS — I2510 Atherosclerotic heart disease of native coronary artery without angina pectoris: Secondary | ICD-10-CM | POA: Diagnosis not present

## 2011-06-07 DIAGNOSIS — E041 Nontoxic single thyroid nodule: Secondary | ICD-10-CM | POA: Diagnosis not present

## 2011-06-07 DIAGNOSIS — J309 Allergic rhinitis, unspecified: Secondary | ICD-10-CM | POA: Diagnosis not present

## 2011-06-17 ENCOUNTER — Telehealth: Payer: Self-pay | Admitting: *Deleted

## 2011-06-17 NOTE — Telephone Encounter (Signed)
Pt called to report he will be able to move his procedure to 07/07/11 at 10:20am. Mailed pt a thank you note and new prep instructions.

## 2011-06-17 NOTE — Telephone Encounter (Signed)
Phoned pt to ask if we could reschedule his appt to 07/07/11. Pt states his daughter is coming from [place] to take him; he will call her and let us know.

## 2011-06-20 ENCOUNTER — Ambulatory Visit (INDEPENDENT_AMBULATORY_CARE_PROVIDER_SITE_OTHER): Payer: Medicare Other | Admitting: *Deleted

## 2011-06-20 DIAGNOSIS — E119 Type 2 diabetes mellitus without complications: Secondary | ICD-10-CM | POA: Diagnosis not present

## 2011-06-20 DIAGNOSIS — I4891 Unspecified atrial fibrillation: Secondary | ICD-10-CM | POA: Diagnosis not present

## 2011-06-20 DIAGNOSIS — E060 Acute thyroiditis: Secondary | ICD-10-CM | POA: Diagnosis not present

## 2011-06-20 DIAGNOSIS — N318 Other neuromuscular dysfunction of bladder: Secondary | ICD-10-CM | POA: Diagnosis not present

## 2011-06-21 DIAGNOSIS — E060 Acute thyroiditis: Secondary | ICD-10-CM | POA: Diagnosis not present

## 2011-06-21 DIAGNOSIS — E069 Thyroiditis, unspecified: Secondary | ICD-10-CM | POA: Diagnosis not present

## 2011-06-29 ENCOUNTER — Other Ambulatory Visit: Payer: Medicare Other | Admitting: Lab

## 2011-06-29 ENCOUNTER — Ambulatory Visit: Payer: Medicare Other | Admitting: Internal Medicine

## 2011-06-30 ENCOUNTER — Other Ambulatory Visit (HOSPITAL_BASED_OUTPATIENT_CLINIC_OR_DEPARTMENT_OTHER): Payer: Medicare Other | Admitting: Lab

## 2011-06-30 ENCOUNTER — Telehealth: Payer: Self-pay | Admitting: Oncology

## 2011-06-30 ENCOUNTER — Encounter: Payer: Self-pay | Admitting: Oncology

## 2011-06-30 ENCOUNTER — Ambulatory Visit (HOSPITAL_BASED_OUTPATIENT_CLINIC_OR_DEPARTMENT_OTHER): Payer: Medicare Other | Admitting: Oncology

## 2011-06-30 VITALS — BP 96/52 | HR 59 | Temp 97.7°F | Ht 72.0 in | Wt 185.5 lb

## 2011-06-30 DIAGNOSIS — D509 Iron deficiency anemia, unspecified: Secondary | ICD-10-CM

## 2011-06-30 DIAGNOSIS — K922 Gastrointestinal hemorrhage, unspecified: Secondary | ICD-10-CM | POA: Diagnosis not present

## 2011-06-30 LAB — COMPREHENSIVE METABOLIC PANEL
BUN: 22 mg/dL (ref 6–23)
CO2: 26 mEq/L (ref 19–32)
Calcium: 9.1 mg/dL (ref 8.4–10.5)
Creatinine, Ser: 1.11 mg/dL (ref 0.50–1.35)
Glucose, Bld: 92 mg/dL (ref 70–99)
Total Bilirubin: 0.5 mg/dL (ref 0.3–1.2)

## 2011-06-30 LAB — CBC WITH DIFFERENTIAL/PLATELET
Eosinophils Absolute: 0.1 10*3/uL (ref 0.0–0.5)
HCT: 39.2 % (ref 38.4–49.9)
LYMPH%: 18.4 % (ref 14.0–49.0)
MCV: 89.5 fL (ref 79.3–98.0)
MONO%: 20.8 % — ABNORMAL HIGH (ref 0.0–14.0)
NEUT#: 3.9 10*3/uL (ref 1.5–6.5)
NEUT%: 59.3 % (ref 39.0–75.0)
Platelets: 163 10*3/uL (ref 140–400)
RBC: 4.38 10*6/uL (ref 4.20–5.82)
nRBC: 0 % (ref 0–0)

## 2011-06-30 LAB — FERRITIN: Ferritin: 28 ng/mL (ref 22–322)

## 2011-06-30 LAB — IRON AND TIBC
Iron: 44 ug/dL (ref 42–165)
TIBC: 387 ug/dL (ref 215–435)
UIBC: 343 ug/dL (ref 125–400)

## 2011-06-30 LAB — LACTATE DEHYDROGENASE: LDH: 218 U/L (ref 94–250)

## 2011-06-30 NOTE — Progress Notes (Signed)
This office note has been dictated.  #[id number]

## 2011-06-30 NOTE — Progress Notes (Signed)
CC:   [name], M.D. [name], M.D. Erick Blinks, MD  PROBLEM LIST:  1. History of ischemic cardiomyopathy/chronic systolic congestive  heart failure with an ejection fraction of 34% to 40%.  2. The patient has ICD defibrillator unit, changed 05/20/2011.  3. Chronic obstructive pulmonary disease.  4. Coronary artery disease status post CABG in 1983 and PCI/stent,  2004.  5. History of ventricular tachycardia.  6. History of erythrocytosis treated in the past with phlebotomy. JAK2 mutation was not detected. 7. Current history of iron deficiency state.  Patient received     Feraheme 255 mg on October 21, 2010, 510 mg on 11/16/2010 and 510     mg on 03/08/2011.  8. GERD.  9. Diabetes mellitus, type 2.  10.Dyslipidemia.  11.Chronic anticoagulation therapy with Coumadin.  12.Hypertension.  13.Osteoarthritis especially involving the right knee.  14.Abnormal thyroid imaging studies from 11/09/2010, status post  ultrasound guided fine needle aspiration of the dominant left  inferior thyroid nodule on 12/15/2010. Cytology report showed rare  follicular epithelial cells and hemosiderin laden macrophages.     MEDICATIONS:  1. Amiodarone 200 mg daily.  2. Aspirin 162 mg daily.  3. Calcium carbonate 1 tablet as needed.  4. Coreg 12.5 mg twice daily.  5. Dexilant 60 mg daily.  6. Omega-3 fish oil 2000 mg daily.  7. Flonase nasal spray 50 mcg per ACT 2 sprays each nostril daily.  8. Lasix 10 mg twice daily.  9. Mucinex 800 mg twice daily.  10.Vicodin 5/500 one every 6 hours as needed.  11.Klor-Con 10 mEq daily.  12.Lisinopril 10 mg twice daily.  13.Claritin 10 mg daily.  14.Dulera inhaled into lungs 2 puffs 3 times a day.  15.Lovaza 1 g twice daily.  16.Systane eyedrops as needed.  17.Polyethylene glycol 1 packet twice daily.  18.Metamucil 1 packet daily.  19.Crestor 5 mg weekly.  20.Carafate 1 g 4 times daily.  21.Coumadin currently 5 mg alternating with 2.5 mg on a daily  basis. Protimes are being monitored by [facility].  HISTORY:  Jovian Lembcke was seen today for followup of his iron- deficiency anemia.  Mr. Hyun was last seen by [place] on 02/28/2011.  In the past, we had followed Mr. Marney for a relative erythrocytosis with JAK2 mutation not detected.  In recent months, Mr. Unangst which has required IV Feraheme for anemia which was secondary to iron deficiency. Stools were Hemoccult positive x2 on 03/08/2011.  The patient denies any obvious blood in his stools.  He denies any pagophagia.  He had undergone an upper endoscopy by Dr. Jeani Hawking on 02/11/2011.  There was a question of portal hypertensive gastroscopy and possibly a distal esophageal varix.  The patient was instructed to continue with PPI and Carafate.  Mr. Ruppe has seen Dr. Erick Blinks and is scheduled for colonoscopy on June 6.  In addition, Mr. Pine tells me that he had undergone a change of his ICD defibrillator unit on April 19th and apparently that went smoothly without problems.  Mr. Fabiano denies any significant changes in his condition.  He bruises quite easily.  He is on Coumadin and aspirin.  He is without any acute problems at this time.  It should be noted that when we saw Mr. Dubie on 02/28/2011, his ferritin came back 32, iron saturation 7% as compared with a ferritin of 92 and a saturation of 17% on 12/29/2010.  On the basis of that, the patient did indeed receive IV Feraheme 510 mg  on 03/08/2011.  A followup ferritin on 04/29/2011 came back 26 despite that dose of IV Feraheme.  PHYSICAL EXAM:  Mr. Congrove shows little change.  He is 78 years old, lives alone, is independent and drives.  He gets around with a 4 pronged cane which he holds in his right hand.  He looks somewhat frail.  Weight is 185.5 pounds, height 6 feet even, body surface area 2.0 sq/m.  Blood pressure today 96/52.  His blood pressure normally runs low.  The patient was made  aware of his low blood pressure today.  Pulse 60 and regular.  Respirations regular and unlabored.  He is afebrile.  There is no scleral icterus.  Mouth and pharynx are benign.  No peripheral adenopathy palpable.  Lungs:  Clear to percussion and auscultation. Cardiac:  Regular rhythm without murmur or rub.  The patient has a defibrillator unit in his left upper chest in the infraclavicular region.  Abdomen:  With the patient sitting is benign.  In the past I thought I was able to feel his liver below the right costal margin in the anterior axillary line.  No obvious ascites or abdominal masses. Extremities:  No peripheral edema or clubbing.  The patient has rather extensive purpura over his arms and even his chest.  Neurologic: Nonfocal.  As stated, he gets around with a 4 pronged cane which he holds in his right hand.  LABORATORY DATA:  Today, white count 6.6, ANC 3.9, hemoglobin 12.9, hematocrit 39.2, platelets 163,000.  Chemistries and iron studies are pending.  Chemistries from 02/28/2011 were notable only for an AST of 46 and a glucose of 105.  As stated, the ferritin was 32, iron saturation 7%.  On 04/29/2011 after the patient had received IV Feraheme heme 510 mg on 03/08/2011, ferritin was 26 and iron saturation 17%.  IMAGING STUDIES:  1. On 11/09/2010 nuclear medicine 24 hour thyroid uptake showed a low  24 hour uptake of 0.8%.  2. Chest x-ray from 11/27/2010 showed borderline cardiomegaly with a  right greater than left pleural effusion, mild associated  atelectasis but no pulmonary vascular congestion.  3. CT scan of the chest without IV contrast on 11/27/2010 showed mild  patchy opacity in the left lower lobe possibly infectious, moderate  right and trace left pleural effusion with associated lower lobe  atelectasis, moderate centrilobular emphysematous changes with no  suspicious pulmonary nodules, bilateral thyroid nodules measuring  up to 4.6 cm on the left.  4. Chest  x-ray, 2 views, on 12/31/2010 showed stable chest findings  with persistent right pleural effusion and basilar densities.  5. Ultrasound guided needle aspirate biopsy of the thyroid gland on  12/14/2009 was carried out. 6. Chest x-ray, 2 view, on 05/21/2011 showed interval placement of a left anterior chest wall AICD/pacemaker without evidence of complication.  There was an interval decrease in the previously identified small bilateral effusions.  PROCEDURES:  Upper endoscopy carried out on 02/11/2011 by Dr. Jeani Hawking showed a question of portal hypertensive gastropathy and questionable distal esophageal varix.  IMPRESSION AND PLAN:  Clinically Mr. Lott is stable.  His hemoglobin today is 12.9, hematocrit 39.2.  However it is of concern that the patient continues to have a low normal ferritin despite the fact that he received IV Feraheme 510 mg on 03/08/2011.  His ferritin before the infusion was 32 and on 04/29/2011 after the infusion 26 thereby confirming the fact that the patient is indeed having GI bleeding.  In addition, his stools from  03/08/2011 were positive for occult blood x2. The patient is scheduled for colonoscopy on June 6th.  In the meantime, will continue to watch the patient closely.  I will await the results of today's iron studies and decide whether we will give the patient additional intravenous iron.  It will be recalled in the past the patient did have an erythrocytosis that was treated with phlebotomy many years ago.  The plan going forward will be to check CBC and ferritin every month.  I have asked Mr. Happ to return in 4 months at which time will check CBC, chemistries, and iron studies.    ______________________________ Samul Dada, M.D. DSM/MEDQ  D:  06/30/2011  T:  06/30/2011  Job:  [id number]

## 2011-06-30 NOTE — Telephone Encounter (Signed)
appts made and printed for pt aom °

## 2011-07-04 ENCOUNTER — Encounter (HOSPITAL_COMMUNITY): Payer: Self-pay | Admitting: *Deleted

## 2011-07-07 ENCOUNTER — Ambulatory Visit (HOSPITAL_COMMUNITY)
Admission: RE | Admit: 2011-07-07 | Discharge: 2011-07-07 | Disposition: A | Discharge status: Home / Self Care | Payer: Medicare Other | Source: Ambulatory Visit | Attending: Internal Medicine | Admitting: Internal Medicine

## 2011-07-07 ENCOUNTER — Encounter (HOSPITAL_COMMUNITY)
Admission: RE | Disposition: A | Discharge status: Home / Self Care | Payer: Self-pay | Source: Ambulatory Visit | Attending: Internal Medicine

## 2011-07-07 ENCOUNTER — Ambulatory Visit (HOSPITAL_COMMUNITY): Payer: Medicare Other | Admitting: Anesthesiology

## 2011-07-07 ENCOUNTER — Encounter (HOSPITAL_COMMUNITY): Payer: Self-pay | Admitting: Anesthesiology

## 2011-07-07 ENCOUNTER — Encounter (HOSPITAL_COMMUNITY): Payer: Self-pay | Admitting: *Deleted

## 2011-07-07 DIAGNOSIS — I5022 Chronic systolic (congestive) heart failure: Secondary | ICD-10-CM | POA: Diagnosis not present

## 2011-07-07 DIAGNOSIS — I4891 Unspecified atrial fibrillation: Secondary | ICD-10-CM | POA: Diagnosis not present

## 2011-07-07 DIAGNOSIS — Z79899 Other long term (current) drug therapy: Secondary | ICD-10-CM | POA: Diagnosis not present

## 2011-07-07 DIAGNOSIS — Z7901 Long term (current) use of anticoagulants: Secondary | ICD-10-CM | POA: Insufficient documentation

## 2011-07-07 DIAGNOSIS — K5520 Angiodysplasia of colon without hemorrhage: Secondary | ICD-10-CM | POA: Insufficient documentation

## 2011-07-07 DIAGNOSIS — R195 Other fecal abnormalities: Secondary | ICD-10-CM | POA: Insufficient documentation

## 2011-07-07 DIAGNOSIS — I509 Heart failure, unspecified: Secondary | ICD-10-CM | POA: Insufficient documentation

## 2011-07-07 DIAGNOSIS — K5730 Diverticulosis of large intestine without perforation or abscess without bleeding: Secondary | ICD-10-CM | POA: Diagnosis not present

## 2011-07-07 DIAGNOSIS — D126 Benign neoplasm of colon, unspecified: Secondary | ICD-10-CM | POA: Diagnosis not present

## 2011-07-07 DIAGNOSIS — K635 Polyp of colon: Secondary | ICD-10-CM

## 2011-07-07 DIAGNOSIS — Z951 Presence of aortocoronary bypass graft: Secondary | ICD-10-CM | POA: Diagnosis not present

## 2011-07-07 DIAGNOSIS — D509 Iron deficiency anemia, unspecified: Secondary | ICD-10-CM | POA: Diagnosis not present

## 2011-07-07 DIAGNOSIS — I2510 Atherosclerotic heart disease of native coronary artery without angina pectoris: Secondary | ICD-10-CM | POA: Insufficient documentation

## 2011-07-07 DIAGNOSIS — Z95810 Presence of automatic (implantable) cardiac defibrillator: Secondary | ICD-10-CM | POA: Diagnosis not present

## 2011-07-07 HISTORY — PX: COLONOSCOPY: SHX5424

## 2011-07-07 SURGERY — COLONOSCOPY
Anesthesia: Monitor Anesthesia Care

## 2011-07-07 MED ORDER — LACTATED RINGERS IV SOLN
INTRAVENOUS | Status: DC
  Administered 2011-07-07: 10:00:00 via INTRAVENOUS

## 2011-07-07 MED ORDER — MEPERIDINE HCL 100 MG/ML IJ SOLN: 6.2500 mg | INTRAMUSCULAR | Status: DC | PRN

## 2011-07-07 MED ORDER — PROPOFOL 10 MG/ML IV EMUL
INTRAVENOUS | Status: DC | PRN
  Administered 2011-07-07: 75 ug/kg/min via INTRAVENOUS

## 2011-07-07 MED ORDER — EPHEDRINE SULFATE 50 MG/ML IJ SOLN
INTRAMUSCULAR | Status: DC | PRN
  Administered 2011-07-07 (×2): 5 mg via INTRAVENOUS

## 2011-07-07 MED ORDER — PHENYLEPHRINE HCL 10 MG/ML IJ SOLN
INTRAMUSCULAR | Status: DC | PRN
  Administered 2011-07-07 (×2): 80 ug via INTRAVENOUS

## 2011-07-07 MED ORDER — PROMETHAZINE HCL 25 MG/ML IJ SOLN: 6.2500 mg | INTRAMUSCULAR | Status: DC | PRN

## 2011-07-07 NOTE — Anesthesia Postprocedure Evaluation (Signed)
  Anesthesia Post-op Note  [name]  Procedure(s) Performed: Procedure(s) (LRB): COLONOSCOPY (N/A)  Patient Location: PACU  Anesthesia Type: MAC  Level of Consciousness: awake and alert   Airway and Oxygen Therapy: Patient Spontanous Breathing  Post-op Pain: mild  Post-op Assessment: Post-op Vital signs reviewed, Patient's Cardiovascular Status Stable, Respiratory Function Stable, Patent Airway and No signs of Nausea or vomiting  Post-op Vital Signs: stable  Complications: No apparent anesthesia complications

## 2011-07-07 NOTE — Transfer of Care (Signed)
Immediate Anesthesia Transfer of Care Note  [name]  Procedure(s) Performed: Procedure(s) (LRB): COLONOSCOPY (N/A)  Patient Location: PACU  Anesthesia Type: MAC  Level of Consciousness: awake, alert , oriented and patient cooperative  Airway & Oxygen Therapy: Patient Spontanous Breathing and Patient connected to face mask oxygen  Post-op Assessment: Report given to PACU RN, Post -op Vital signs reviewed and stable and Patient moving all extremities X 4  Post vital signs: stable  Complications: No apparent anesthesia complications

## 2011-07-07 NOTE — Anesthesia Preprocedure Evaluation (Addendum)
Anesthesia Evaluation  Patient identified by MRN, date of birth, ID band Patient awake    Reviewed: Allergy & Precautions, H&P , NPO status , Patient's Chart, lab work & pertinent test results  Airway Mallampati: II TM Distance: >3 FB Neck ROM: Full    Dental No notable dental hx. (+) Edentulous Upper and Edentulous Lower   Pulmonary neg pulmonary ROS,  breath sounds clear to auscultation  Pulmonary exam normal       Cardiovascular hypertension, Pt. on medications + CAD, + CABG (1993 sten 2004) and +CHF + dysrhythmias Atrial Fibrillation and Ventricular Tachycardia + Cardiac Defibrillator ( EF 35-40% s/p most recent ICD generator change-out with [employer] dual-chamber ICD 05/20/11) Rhythm:Regular Rate:Normal     Neuro/Psych negative neurological ROS  negative psych ROS   GI/Hepatic negative GI ROS, Neg liver ROS,   Endo/Other  negative endocrine ROSDiabetes mellitus-  Renal/GU negative Renal ROS  negative genitourinary   Musculoskeletal negative musculoskeletal ROS (+)   Abdominal   Peds negative pediatric ROS (+)  Hematology negative hematology ROS (+)   Anesthesia Other Findings   Reproductive/Obstetrics negative OB ROS                         Anesthesia Physical Anesthesia Plan  ASA: III  Anesthesia Plan: MAC   Post-op Pain Management:    Induction: Intravenous  Airway Management Planned:   Additional Equipment:   Intra-op Plan:   Post-operative Plan:   Informed Consent: I have reviewed the patients History and Physical, chart, labs and discussed the procedure including the risks, benefits and alternatives for the proposed anesthesia with the patient or authorized representative who has indicated his/her understanding and acceptance.   Dental advisory given  Plan Discussed with: CRNA  Anesthesia Plan Comments:         Anesthesia Quick Evaluation

## 2011-07-07 NOTE — H&P (Signed)
HPI   Mr. Chmiel is a 78 yo male with PMH of CHF with ICD in place, afib on chronic warfarin, anemia, CAD status post CABG who was seen in consultation at the request of Dr. Felipa Eth for evaluation of heme positive stool. He has been followed, for some time by Dr. Jeani Hawking but requested to switch to our practice.  His primary reason for being seen was heme positive stool. He reports remote colonoscopy, likely greater than 10 years ago performed by Dr. Virginia Rochester. He does not recall history of polyps. He has not seen any blood in his stool and he denies melena. Bowel habits are unchanged, and he does continue to experience some constipation. He is on MiraLAX once per day and when he takes this his bowel movements are regular and not hard. He had a positive FOBT which was performed in February 2013  No new complaints today.  Warfarin has been on hold.  Review of Systems  As per history of present illness, otherwise negative  Patient Active Problem List   Diagnoses   .  VENTRICULAR TACHYCARDIA   .  ATRIAL FIBRILLATION   .  Chronic systolic heart failure   .  AUTOMATIC IMPLANTABLE CARDIAC DEFIBRILLATOR SITU   .  CAD (coronary artery disease)   .  Chronic anticoagulation   .  Bradycardia   .  Anemia, iron deficiency    Medication List  As of 07/07/2011 10:10 AM   ASK your doctor about these medications         amiodarone 200 MG tablet   Commonly known as: PACERONE   Take 200 mg by mouth daily.      aspirin 81 MG tablet   Take 2 tablets (162 mg total) by mouth daily.      calcium carbonate 500 MG chewable tablet   Commonly known as: TUMS - dosed in mg elemental calcium   Chew 3 tablets by mouth as needed. For heart burn      carvedilol 25 MG tablet   Commonly known as: COREG   Take 25 mg by mouth 2 (two) times daily with a meal. 12.5mg  in the morning and 25mg  in the evening      DEXILANT 60 MG capsule   Generic drug: dexlansoprazole   Take 60 mg by mouth daily.      DULERA IN   Inhale 2  puffs into the lungs 2 (two) times daily.      fluticasone 50 MCG/ACT nasal spray   Commonly known as: FLONASE   Place 2 sprays into the nose daily.      furosemide 40 MG tablet   Commonly known as: LASIX   Take 40 mg by mouth 2 (two) times daily.      furosemide 20 MG tablet   Commonly known as: LASIX   Take 40 mg by mouth 2 (two) times daily.      guaifenesin 400 MG Tabs   Commonly known as: HUMIBID E   Take 400 mg by mouth 2 (two) times daily.      HYDROcodone-acetaminophen 5-500 MG per tablet   Commonly known as: VICODIN   Take 1 tablet by mouth every 6 (six) hours as needed. For pain      lisinopril 10 MG tablet   Commonly known as: PRINIVIL,ZESTRIL   TAKE ONE TABLET BY MOUTH TWICE DAILY      loratadine 10 MG tablet   Commonly known as: CLARITIN   Take 10 mg by mouth daily.  omega-3 acid ethyl esters 1 G capsule   Commonly known as: LOVAZA   Take 1 g by mouth 2 (two) times daily.      peg 3350 powder 100 G Solr   Commonly known as: MOVIPREP   Take 1 kit (100 g total) by mouth once.      polyethylene glycol packet   Commonly known as: MIRALAX / GLYCOLAX   Take 17 g by mouth 2 (two) times daily as needed. For constipation      potassium chloride SA 20 MEQ tablet   Commonly known as: K-DUR,KLOR-CON   Take 10 mEq by mouth 2 (two) times daily.      psyllium 58.6 % packet   Commonly known as: METAMUCIL   Take 1 packet by mouth daily.      rosuvastatin 5 MG tablet   Commonly known as: CRESTOR   Take 5 mg by mouth once a week.      SYSTANE OP   Place 1 drop into both eyes as needed. For dry eyes      warfarin 5 MG tablet   Commonly known as: COUMADIN   Take 0.5-1 tablets (2.5-5 mg total) by mouth daily. IMPORTANT: Do not restart until Sunday 05/22/11. At that time, you can restart taking your previous dose of 5mg  Tuesday, Saturday, Sunday; 2.5mg  Monday, Wednesday, Thursday, Friday.             Past Surgical History   Procedure  Date   .  Cardiac  defibrillator placement    .  Coronary artery bypass graft  1983   .  Cholecystectomy    .  Esophagogastroduodenoscopy  02/11/2011     Procedure: ESOPHAGOGASTRODUODENOSCOPY (EGD); Surgeon: Theda Belfast, MD; Location: [facility]; Service: Endoscopy; Laterality: N/A;   .  Cardiac catheterization  10/17/06   .  Angioplasty      stent placement     Allergies   Allergen  Reactions   .  Celebrex (Celecoxib)  Hives     Gi upset   .  Digoxin    .  Esomeprazole Magnesium  Hives   .  Multaq (Dronedarone Hydrochloride)     Family History   Problem  Relation  Age of Onset   .  Heart disease  Brother    .  Diabetes  Sister    .  Diabetes  Brother    .  Tuberculosis  Mother    .  Tuberculosis  Father    .  Clotting disorder  Brother     History   Substance Use Topics   .  Smoking status:  Former Smoker     Quit date:  06/23/1976   .  Smokeless tobacco:  Never Used   .  Alcohol Use:  No     Objective:   Physical Exam  BP 110/60  Pulse 64  Ht 6' (1.829 m)  Wt 185 lb 6 oz (84.086 kg)  BMI 25.14 kg/m2  Gen: awake, alert, NAD HEENT: anicteric, op clear CV: RRR, no mrg Pulm: CTA b/l Abd: soft, NT/ND, +BS throughout Ext: no c/c/e Neuro: nonfocal  CBC    Component Value Date/Time   WBC 6.6 06/30/2011 0915   WBC 6.3 05/10/2011 1232   RBC 4.38 06/30/2011 0915   RBC 4.58 05/10/2011 1232   HGB 12.9* 06/30/2011 0915   HGB 13.0 05/10/2011 1232   HCT 39.2 06/30/2011 0915   HCT 39.4 05/10/2011 1232   PLT 163 06/30/2011 0915   PLT 168.0  05/10/2011 1232   MCV 89.5 06/30/2011 0915   MCV 86.0 05/10/2011 1232   MCH 29.4 06/30/2011 0915   MCH 24.0* 11/30/2010 0610   MCHC 32.8 06/30/2011 0915   MCHC 33.1 05/10/2011 1232   RDW 17.1* 06/30/2011 0915   RDW 21.0* 05/10/2011 1232   LYMPHSABS 1.2 06/30/2011 0915   LYMPHSABS 1.4 05/10/2011 1232   MONOABS 1.4* 06/30/2011 0915   MONOABS 1.1* 05/10/2011 1232   EOSABS 0.1 06/30/2011 0915   EOSABS 0.1 05/10/2011 1232   BASOSABS 0.0 06/30/2011 0915   BASOSABS 0.0  05/10/2011 1232    CMP     Component Value Date/Time   NA 139 06/30/2011 0915   K 4.1 06/30/2011 0915   CL 103 06/30/2011 0915   CO2 26 06/30/2011 0915   GLUCOSE 92 06/30/2011 0915   BUN 22 06/30/2011 0915   CREATININE 1.11 06/30/2011 0915   CALCIUM 9.1 06/30/2011 0915   PROT 6.8 06/30/2011 0915   ALBUMIN 4.3 06/30/2011 0915   AST 50* 06/30/2011 0915   ALT 47 06/30/2011 0915   ALKPHOS 92 06/30/2011 0915   BILITOT 0.5 06/30/2011 0915   GFRNONAA 62* 11/30/2010 0610   GFRAA 72* 11/30/2010 0610    Iron/TIBC/Ferritin    Component Value Date/Time   IRON 44 06/30/2011 0915   TIBC 387 06/30/2011 0915   FERRITIN 28 06/30/2011 0915     Assessment & Plan:   78 yo male with PMH of CHF with ICD in place, afib on chronic warfarin, anemia, CAD status post CABG who presents for colonoscopy given heme positive stool and iron def.   1. + FOBT -- the patient has borderline low iron stores, but at present is not anemic.  He did receive IV iron and has continued to have borderline low iron stores.  He did have positive FOBT from February, and he had a recent EGD without source for bleeding. Warfarin has been on hold.   Plan colonoscopy today. The nature of the procedure, as well as the risks, benefits, and alternatives were carefully and thoroughly reviewed with the patient. Ample time for discussion and questions allowed. The patient understood, was satisfied, and agreed to proceed.

## 2011-07-07 NOTE — Discharge Instructions (Signed)
Colonoscopy Care After Read the instructions outlined below and refer to this sheet in the next few weeks. These discharge instructions provide you with general information on caring for yourself after you leave the hospital. Your doctor may also give you specific instructions. While your treatment has been planned according to the most current medical practices available, unavoidable complications occasionally occur. If you have any problems or questions after discharge, call your doctor. HOME CARE INSTRUCTIONS ACTIVITY:  You may resume your regular activity, but move at a slower pace for the next 24 hours.   Take frequent rest periods for the next 24 hours.   Walking will help get rid of the air and reduce the bloated feeling in your belly (abdomen).   No driving for 24 hours (because of the medicine (anesthesia) used during the test).   You may shower.   Do not sign any important legal documents or operate any machinery for 24 hours (because of the anesthesia used during the test).  NUTRITION:  Drink plenty of fluids.   You may resume your normal diet as instructed by your doctor.   Begin with a light meal and progress to your normal diet. Heavy or fried foods are harder to digest and may make you feel sick to your stomach (nauseated).   Avoid alcoholic beverages for 24 hours or as instructed.  MEDICATIONS:  You may resume your normal medications unless your doctor tells you otherwise.  WHAT TO EXPECT TODAY:  Some feelings of bloating in the abdomen.   Passage of more gas than usual.   Spotting of blood in your stool or on the toilet paper.  IF YOU HAD POLYPS REMOVED DURING THE COLONOSCOPY:  No aspirin products for 7 days or as instructed.   No alcohol for 7 days or as instructed.   Eat a soft diet for the next 24 hours.  FINDING OUT THE RESULTS OF YOUR TEST Not all test results are available during your visit. If your test results are not back during the visit, make an  appointment with your caregiver to find out the results. Do not assume everything is normal if you have not heard from your caregiver or the medical facility. It is important for you to follow up on all of your test results.  SEEK IMMEDIATE MEDICAL CARE IF:  You have more than a spotting of blood in your stool.   Your belly is swollen (abdominal distention).   You are nauseated or vomiting.   You have a fever.   You have abdominal pain or discomfort that is severe or gets worse throughout the day.  Document Released: 09/01/2003 Document Revised: 01/06/2011 Document Reviewed: 08/30/2007 [employer] Patient Information 2012 [employer].   Diverticulosis Diverticulosis is a common condition that develops when small pouches (diverticula) form in the wall of the colon. The risk of diverticulosis increases with age. It happens more often in people who eat a low-fiber diet. Most individuals with diverticulosis have no symptoms. Those individuals with symptoms usually experience abdominal pain, constipation, or loose stools (diarrhea). HOME CARE INSTRUCTIONS   Increase the amount of fiber in your diet as directed by your caregiver or dietician. This may reduce symptoms of diverticulosis.   Your caregiver may recommend taking a dietary fiber supplement.   Drink at least 6 to 8 glasses of water each day to prevent constipation.   Try not to strain when you have a bowel movement.   Your caregiver may recommend avoiding nuts and seeds to prevent   is still an uncertain benefit.   Only take over-the-counter or prescription medicines for pain, discomfort, or fever as directed by your caregiver.  FOODS WITH HIGH FIBER CONTENT INCLUDE:  Fruits. Apple, peach, pear, tangerine, raisins, prunes.   Vegetables. Brussels sprouts, asparagus, broccoli, cabbage, carrot, cauliflower, romaine lettuce, spinach, summer squash, tomato, winter squash, zucchini.   Starchy Vegetables. Baked  beans, kidney beans, lima beans, split peas, lentils, potatoes (with skin).   Grains. Whole wheat bread, brown rice, bran flake cereal, plain oatmeal, white rice, shredded wheat, bran muffins.  SEEK IMMEDIATE MEDICAL CARE IF:   You develop increasing pain or severe bloating.   You have an oral temperature above 102 F (38.9 C), not controlled by medicine.   You develop vomiting or bowel movements that are bloody or black.  Document Released: 10/15/2003 Document Revised: 01/06/2011 Document Reviewed: 06/17/2009 [facility] Patient Information 2012 [place], [place].Diverticulosis Diverticulosis is a common condition that develops when small pouches (diverticula) form in the wall of the colon. The risk of diverticulosis increases with age. It happens more often in people who eat a low-fiber diet. Most individuals with diverticulosis have no symptoms. Those individuals with symptoms usually experience abdominal pain, constipation, or loose stools (diarrhea). HOME CARE INSTRUCTIONS   Increase the amount of fiber in your diet as directed by your caregiver or dietician. This may reduce symptoms of diverticulosis.   Your caregiver may recommend taking a dietary fiber supplement.   Drink at least 6 to 8 glasses of water each day to prevent constipation.   Try not to strain when you have a bowel movement.   Your caregiver may recommend avoiding nuts and seeds to prevent complications, although this is still an uncertain benefit.   Only take over-the-counter or prescription medicines for pain, discomfort, or fever as directed by your caregiver.  FOODS WITH HIGH FIBER CONTENT INCLUDE:  Fruits. Apple, peach, pear, tangerine, raisins, prunes.   Vegetables. Brussels sprouts, asparagus, broccoli, cabbage, carrot, cauliflower, romaine lettuce, spinach, summer squash, tomato, winter squash, zucchini.   Starchy Vegetables. Baked beans, kidney beans, lima beans, split peas, lentils, potatoes (with skin).    Grains. Whole wheat bread, brown rice, bran flake cereal, plain oatmeal, white rice, shredded wheat, bran muffins.  SEEK IMMEDIATE MEDICAL CARE IF:   You develop increasing pain or severe bloating.   You have an oral temperature above 102 F (38.9 C), not controlled by medicine.   You develop vomiting or bowel movements that are bloody or black.  Document Released: 10/15/2003 Document Revised: 01/06/2011 Document Reviewed: 06/17/2009 [facility] Patient Information 2012 [place], [place].

## 2011-07-07 NOTE — Op Note (Signed)
[facility] [address] [place], [place]  [address]  COLONOSCOPY PROCEDURE REPORT  PATIENT:  Joshua, [name]  MR#:  [id number] BIRTHDATE:  01-25-1933, 78 yrs. old  GENDER:  male ENDOSCOPIST:  Carie Caddy. Isaiahs Chancy, MD REF. BY:  Chilton Greathouse, M.D. Kimberlee Nearing, M.D. PROCEDURE DATE:  07/07/2011 PROCEDURE:  Colonoscopy with snare polypectomy, Colon with cold biopsy polypectomy, Colonoscopy with ablation ASA CLASS:  Class III INDICATIONS:  FOBT positive stool, Iron Deficiency Anemia MEDICATIONS:   See Anesthesia Report., MAC sedation, administered by CRNA  DESCRIPTION OF PROCEDURE:   After the risks benefits and alternatives of the procedure were thoroughly explained, informed consent was obtained.  No rectal exam performed. The Pentax Colonoscope [id number] and [id number] [phone]) endoscope was introduced through the anus and advanced to the terminal ileum which was intubated for a short distance, without limitations. The quality of the prep was good, using MoviPrep.  The instrument was then slowly withdrawn as the colon was fully examined. <<PROCEDUREIMAGES>>  FINDINGS:  Two angioectasias were found in the ascending colon. APC was used to ablation both lesions with success (1L/min, 25 W) Three sessile polyps, 2- 5 mm were found in the descending colon. The polyps were removed using cold biopsy forceps.  A 6 mm sessile polyp was found in the sigmoid colon. Polyp was snared without cautery. Retrieval was successful.  Mild diverticulosis was found in the left colon.   Retroflexed views in the rectum revealed no abnormalities.   The scope was then withdrawn in minutes from the cecum and the procedure completed.  COMPLICATIONS:  None  ENDOSCOPIC IMPRESSION: 1) 2 angioectasia were found in the ascending colon. Successfully ablated with APC. 2) Three polyps in the descending colon.  Removed and sent to pathology. 3) Sessile polyp in the sigmoid colon. Removed and sent  to pathology. 4) Mild diverticulosis in the left colon  RECOMMENDATIONS: 1) Await pathology results 2) Continue to monitor Hgb/HCT and iron stores to ensure normalization. 3) Can resume warfarin now. 4) Repeat colonoscopy interval will be based on pathology results and your overall health condition at that time. In general, screening and polyp surveillance colonoscopy stops around age 60.  Carie Caddy. Rhea Belton, MD  CC:  The Patient Chilton Greathouse, MD Kimberlee Nearing, MD  n. Rosalie DoctorCarie Caddy. Sahra Converse at 07/07/2011 11:29 AM  [name], [phone]

## 2011-07-08 ENCOUNTER — Encounter (HOSPITAL_COMMUNITY): Payer: Self-pay | Admitting: Internal Medicine

## 2011-07-08 ENCOUNTER — Encounter: Payer: Self-pay | Admitting: Oncology

## 2011-07-08 ENCOUNTER — Telehealth: Payer: Self-pay | Admitting: Cardiology

## 2011-07-08 NOTE — Progress Notes (Signed)
This patient underwent a colonoscopy on 07/07/2011 by Dr. Erick Blinks. He was found to have 2 angioectasias in the a sending:. These were successfully ablated with APC. 3 polyps were found in the descending colon and removed. There was a sessile polyp in the sigmoid colon. This also was removed and sent to pathology. Mild diverticulosis was seen in the left colon.  The pathology report is still pending as of 07/08/2011.

## 2011-07-08 NOTE — Telephone Encounter (Signed)
New msg Pt just had colonoscopy yesterday and he wanted to talk to you about not taking aspirin.

## 2011-07-10 ENCOUNTER — Encounter: Payer: Self-pay | Admitting: Internal Medicine

## 2011-07-11 ENCOUNTER — Encounter: Payer: Self-pay | Admitting: Oncology

## 2011-07-11 NOTE — Progress Notes (Unsigned)
Pathology report from 07/07/2011 revealed fragments of tubular adenoma (adenomatous changes in all fragments). No high-grade dysplasia or malignancy was identified.

## 2011-07-14 ENCOUNTER — Ambulatory Visit (INDEPENDENT_AMBULATORY_CARE_PROVIDER_SITE_OTHER): Payer: Medicare Other | Admitting: Pharmacist

## 2011-07-14 DIAGNOSIS — I4891 Unspecified atrial fibrillation: Secondary | ICD-10-CM | POA: Diagnosis not present

## 2011-07-14 DIAGNOSIS — H04129 Dry eye syndrome of unspecified lacrimal gland: Secondary | ICD-10-CM | POA: Diagnosis not present

## 2011-07-14 LAB — POCT INR: INR: 2.5

## 2011-07-18 MED ORDER — DEXLANSOPRAZOLE 60 MG PO CPDR: 60.0000 mg | DELAYED_RELEASE_CAPSULE | Freq: Every day | ORAL | Status: DC

## 2011-07-18 MED ORDER — AMIODARONE HCL 200 MG PO TABS: 200.0000 mg | ORAL_TABLET | Freq: Every day | ORAL | Status: DC

## 2011-07-18 MED ORDER — POTASSIUM CHLORIDE CRYS ER 20 MEQ PO TBCR: 10.0000 meq | EXTENDED_RELEASE_TABLET | Freq: Two times a day (BID) | ORAL | Status: DC

## 2011-07-18 MED ORDER — LISINOPRIL 10 MG PO TABS: 10.0000 mg | ORAL_TABLET | Freq: Two times a day (BID) | ORAL | Status: DC

## 2011-07-18 MED ORDER — CARVEDILOL 25 MG PO TABS: 25.0000 mg | ORAL_TABLET | Freq: Two times a day (BID) | ORAL | Status: DC

## 2011-07-18 MED ORDER — ROSUVASTATIN CALCIUM 5 MG PO TABS: 5.0000 mg | ORAL_TABLET | ORAL | Status: DC

## 2011-07-18 MED ORDER — FUROSEMIDE 40 MG PO TABS: 40.0000 mg | ORAL_TABLET | Freq: Two times a day (BID) | ORAL | Status: DC

## 2011-07-18 NOTE — Telephone Encounter (Signed)
New problem:  Patient would like a 90 days supply for all his medication .

## 2011-07-18 NOTE — Telephone Encounter (Signed)
Patient called wanting 90 day supply on all of his medications.Prescriptions sent to [facility] on [address].Patient was also ask did he receive phone call back on message 07/08/11.Patient was told I never received message .States he already had colonoscopy and did good.States he held asa 5 days before and restarted asa that afternoon.

## 2011-07-19 ENCOUNTER — Other Ambulatory Visit: Payer: Self-pay | Admitting: Cardiology

## 2011-07-19 NOTE — Telephone Encounter (Signed)
Please return call to [facility] - [place],  - [address] [phone] to clarify dosage instructions.

## 2011-07-19 NOTE — Telephone Encounter (Signed)
Called both patient and pharmacy to see which rx was misunderstood. Figuring it must be coreg b/c it has 2 instructions. But no answer from pt to see how he actually takes his coreg. LMTCB

## 2011-07-20 ENCOUNTER — Other Ambulatory Visit: Payer: Self-pay | Admitting: *Deleted

## 2011-07-20 ENCOUNTER — Telehealth: Payer: Self-pay | Admitting: Cardiology

## 2011-07-20 MED ORDER — DEXLANSOPRAZOLE 60 MG PO CPDR: 60.0000 mg | DELAYED_RELEASE_CAPSULE | Freq: Every day | ORAL | Status: DC

## 2011-07-20 MED ORDER — ROSUVASTATIN CALCIUM 5 MG PO TABS: 5.0000 mg | ORAL_TABLET | ORAL | Status: DC

## 2011-07-20 MED ORDER — CARVEDILOL 25 MG PO TABS: ORAL_TABLET | ORAL | Status: DC

## 2011-07-20 MED ORDER — FUROSEMIDE 40 MG PO TABS: 40.0000 mg | ORAL_TABLET | Freq: Two times a day (BID) | ORAL | Status: DC

## 2011-07-20 MED ORDER — LISINOPRIL 10 MG PO TABS: 10.0000 mg | ORAL_TABLET | Freq: Two times a day (BID) | ORAL | Status: DC

## 2011-07-20 MED ORDER — AMIODARONE HCL 200 MG PO TABS: 200.0000 mg | ORAL_TABLET | Freq: Every day | ORAL | Status: DC

## 2011-07-20 MED ORDER — CARVEDILOL 25 MG PO TABS: 25.0000 mg | ORAL_TABLET | Freq: Two times a day (BID) | ORAL | Status: DC

## 2011-07-20 NOTE — Telephone Encounter (Signed)
New msg Pt wants to talk about his meds. Please call

## 2011-07-20 NOTE — Telephone Encounter (Signed)
Patient called stated he wanted 90 supply on his medication sent to [facility].

## 2011-07-20 NOTE — Telephone Encounter (Signed)
Opened in Error.

## 2011-07-28 ENCOUNTER — Other Ambulatory Visit: Payer: Self-pay | Admitting: Oncology

## 2011-07-28 ENCOUNTER — Other Ambulatory Visit: Payer: Medicare Other | Admitting: Lab

## 2011-07-28 ENCOUNTER — Telehealth: Payer: Self-pay

## 2011-07-28 ENCOUNTER — Encounter: Payer: Self-pay | Admitting: Oncology

## 2011-07-28 DIAGNOSIS — D509 Iron deficiency anemia, unspecified: Secondary | ICD-10-CM

## 2011-07-28 LAB — CBC WITH DIFFERENTIAL/PLATELET
BASO%: 0.3 % (ref 0.0–2.0)
Eosinophils Absolute: 0.1 10*3/uL (ref 0.0–0.5)
HCT: 34.4 % — ABNORMAL LOW (ref 38.4–49.9)
MCHC: 33.1 g/dL (ref 32.0–36.0)
MONO#: 1.3 10*3/uL — ABNORMAL HIGH (ref 0.1–0.9)
NEUT#: 3.6 10*3/uL (ref 1.5–6.5)
NEUT%: 57.1 % (ref 39.0–75.0)
RBC: 3.91 10*6/uL — ABNORMAL LOW (ref 4.20–5.82)
WBC: 6.3 10*3/uL (ref 4.0–10.3)
lymph#: 1.3 10*3/uL (ref 0.9–3.3)

## 2011-07-28 LAB — FERRITIN: Ferritin: 20 ng/mL — ABNORMAL LOW (ref 22–322)

## 2011-07-28 NOTE — Progress Notes (Signed)
Ferritin on 07/28/2011 was 20. Hemoglobin was 11.4. Hematocrit 34.4 as compared with 12.9 and 39.2 on 06/30/2011. Ferritin on 06/10/2011 was 28.  Patient most recently received IV Feraheme 510 mg on 03/08/2011.  We will go ahead and set him up for another dose of IV Feraheme 510 mg on or about 08/02/2011.

## 2011-07-28 NOTE — Telephone Encounter (Signed)
S/w pt that [facility] wants him to get a dose of iv feraheme and we will call him when it is scheduled

## 2011-08-01 DIAGNOSIS — S065XAA Traumatic subdural hemorrhage with loss of consciousness status unknown, initial encounter: Secondary | ICD-10-CM

## 2011-08-01 DIAGNOSIS — S065X9A Traumatic subdural hemorrhage with loss of consciousness of unspecified duration, initial encounter: Secondary | ICD-10-CM

## 2011-08-01 HISTORY — DX: Traumatic subdural hemorrhage with loss of consciousness of unspecified duration, initial encounter: S06.5X9A

## 2011-08-01 HISTORY — DX: Traumatic subdural hemorrhage with loss of consciousness status unknown, initial encounter: S06.5XAA

## 2011-08-02 ENCOUNTER — Ambulatory Visit: Payer: Medicare Other

## 2011-08-03 ENCOUNTER — Telehealth: Payer: Self-pay

## 2011-08-03 NOTE — Telephone Encounter (Signed)
Got call that pt no showed his feraheme appt. Will f/u tomorrow

## 2011-08-05 ENCOUNTER — Other Ambulatory Visit: Payer: Self-pay | Admitting: *Deleted

## 2011-08-08 ENCOUNTER — Ambulatory Visit (HOSPITAL_BASED_OUTPATIENT_CLINIC_OR_DEPARTMENT_OTHER): Payer: Medicare Other

## 2011-08-08 VITALS — BP 114/64 | HR 55 | Temp 97.1°F

## 2011-08-08 DIAGNOSIS — D509 Iron deficiency anemia, unspecified: Secondary | ICD-10-CM | POA: Diagnosis not present

## 2011-08-08 MED ORDER — FERUMOXYTOL INJECTION 510 MG/17 ML
510.0000 mg | Freq: Once | INTRAVENOUS | Status: AC
  Administered 2011-08-08: 510 mg via INTRAVENOUS
  Filled 2011-08-08: qty 17

## 2011-08-08 MED ORDER — SODIUM CHLORIDE 0.9 % IV SOLN
Freq: Once | INTRAVENOUS | Status: AC
  Administered 2011-08-08: 09:00:00 via INTRAVENOUS

## 2011-08-08 NOTE — Patient Instructions (Signed)
Feraheme (Ferumoxytol)  What is this medicine? FERUMOXYTOL is an iron complex. Iron is used to make healthy red blood cells, which carry oxygen and nutrients throughout the body. This medicine is used to treat iron deficiency anemia in people with chronic kidney disease. This medicine may be used for other purposes; ask your health care provider or pharmacist if you have questions.  What should I tell my health care provider before I take this medicine? They need to know if you have any of these conditions: -anemia not caused by low iron levels -high levels of iron in the blood -magnetic resonance imaging (MRI) test scheduled -an unusual or allergic reaction to iron, other medicines, foods, dyes, or preservatives -pregnant or trying to get pregnant -breast-feeding  How should I use this medicine? This medicine is for infusion into a vein. It is given by a health care professional in a hospital or clinic setting. Talk to your pediatrician regarding the use of this medicine in children. Special care may be needed. Overdosage: If you think you've taken too much of this medicine contact a poison control center or emergency room at once. Overdosage: If you think you have taken too much of this medicine contact a poison control center or emergency room at once. NOTE: This medicine is only for you. Do not share this medicine with others. What if I miss a dose? It is important not to miss your dose. Call your doctor or health care professional if you are unable to keep an appointment.  What may interact with this medicine? This medicine may interact with the following medications: -other iron products This list may not describe all possible interactions. Give your health care provider a list of all the medicines, herbs, non-prescription drugs, or dietary supplements you use. Also tell them if you smoke, drink alcohol, or use illegal drugs. Some items may interact with your medicine.  What should  I watch for while using this medicine? Visit your doctor or healthcare professional regularly. Tell your doctor or healthcare professional if your symptoms do not start to get better or if they get worse. You may need blood work done while you are taking this medicine. You may need to follow a special diet. Talk to your doctor. Foods that contain iron include: whole grains/cereals, dried fruits, beans, or peas, leafy green vegetables, and organ meats (liver, kidney).  What side effects may I notice from receiving this medicine? Side effects that you should report to your doctor or health care professional as soon as possible: -allergic reactions like skin rash, itching or hives, swelling of the face, lips, or tongue -breathing problems -changes in blood pressure -feeling faint or lightheaded, falls -fever or chills -flushing, sweating, or hot feelings -swelling of the ankles or feet  Side effects that usually do not require medical attention (Report these to your doctor or health care professional if they continue or are bothersome.): -diarrhea -headache -nausea, vomiting -stomach pain This list may not describe all possible side effects. Call your doctor for medical advice about side effects. You may report side effects to FDA at 1-800-FDA-1088. Where should I keep my medicine? This drug is given in a hospital or clinic and will not be stored at home. NOTE: This sheet is a summary. It may not cover all possible information. If you have questions about this medicine, talk to your doctor, pharmacist, or health care provider.  2012, [employer]. (10/10/2007 9:48:25 PM) 

## 2011-08-11 ENCOUNTER — Ambulatory Visit: Payer: Medicare Other | Admitting: Cardiology

## 2011-08-11 ENCOUNTER — Ambulatory Visit (INDEPENDENT_AMBULATORY_CARE_PROVIDER_SITE_OTHER): Payer: Medicare Other | Admitting: *Deleted

## 2011-08-11 DIAGNOSIS — I4891 Unspecified atrial fibrillation: Secondary | ICD-10-CM

## 2011-08-11 LAB — POCT INR: INR: 2.5

## 2011-08-12 ENCOUNTER — Emergency Department (HOSPITAL_COMMUNITY): Payer: Medicare Other

## 2011-08-12 ENCOUNTER — Encounter (HOSPITAL_COMMUNITY): Payer: Self-pay

## 2011-08-12 ENCOUNTER — Inpatient Hospital Stay (HOSPITAL_COMMUNITY): Payer: Medicare Other

## 2011-08-12 ENCOUNTER — Inpatient Hospital Stay (HOSPITAL_COMMUNITY)
Admission: EM | Admit: 2011-08-12 | Discharge: 2011-08-14 | DRG: 065 | Disposition: A | Discharge status: Home / Self Care | Payer: Medicare Other | Attending: Family Medicine | Admitting: Family Medicine

## 2011-08-12 DIAGNOSIS — R001 Bradycardia, unspecified: Secondary | ICD-10-CM

## 2011-08-12 DIAGNOSIS — D689 Coagulation defect, unspecified: Secondary | ICD-10-CM

## 2011-08-12 DIAGNOSIS — S065X9A Traumatic subdural hemorrhage with loss of consciousness of unspecified duration, initial encounter: Secondary | ICD-10-CM | POA: Diagnosis not present

## 2011-08-12 DIAGNOSIS — R279 Unspecified lack of coordination: Secondary | ICD-10-CM | POA: Diagnosis not present

## 2011-08-12 DIAGNOSIS — R42 Dizziness and giddiness: Secondary | ICD-10-CM | POA: Diagnosis not present

## 2011-08-12 DIAGNOSIS — I2589 Other forms of chronic ischemic heart disease: Secondary | ICD-10-CM | POA: Diagnosis present

## 2011-08-12 DIAGNOSIS — R1013 Epigastric pain: Secondary | ICD-10-CM

## 2011-08-12 DIAGNOSIS — D509 Iron deficiency anemia, unspecified: Secondary | ICD-10-CM

## 2011-08-12 DIAGNOSIS — Z951 Presence of aortocoronary bypass graft: Secondary | ICD-10-CM | POA: Diagnosis not present

## 2011-08-12 DIAGNOSIS — Z7901 Long term (current) use of anticoagulants: Secondary | ICD-10-CM | POA: Diagnosis not present

## 2011-08-12 DIAGNOSIS — I509 Heart failure, unspecified: Secondary | ICD-10-CM | POA: Diagnosis present

## 2011-08-12 DIAGNOSIS — S065XAA Traumatic subdural hemorrhage with loss of consciousness status unknown, initial encounter: Secondary | ICD-10-CM | POA: Diagnosis not present

## 2011-08-12 DIAGNOSIS — I6350 Cerebral infarction due to unspecified occlusion or stenosis of unspecified cerebral artery: Secondary | ICD-10-CM | POA: Diagnosis not present

## 2011-08-12 DIAGNOSIS — R0602 Shortness of breath: Secondary | ICD-10-CM

## 2011-08-12 DIAGNOSIS — I6200 Nontraumatic subdural hemorrhage, unspecified: Secondary | ICD-10-CM | POA: Diagnosis not present

## 2011-08-12 DIAGNOSIS — I5022 Chronic systolic (congestive) heart failure: Secondary | ICD-10-CM | POA: Diagnosis not present

## 2011-08-12 DIAGNOSIS — I472 Ventricular tachycardia, unspecified: Secondary | ICD-10-CM

## 2011-08-12 DIAGNOSIS — M129 Arthropathy, unspecified: Secondary | ICD-10-CM | POA: Diagnosis present

## 2011-08-12 DIAGNOSIS — K5520 Angiodysplasia of colon without hemorrhage: Secondary | ICD-10-CM

## 2011-08-12 DIAGNOSIS — G459 Transient cerebral ischemic attack, unspecified: Secondary | ICD-10-CM | POA: Diagnosis present

## 2011-08-12 DIAGNOSIS — I4891 Unspecified atrial fibrillation: Secondary | ICD-10-CM | POA: Diagnosis not present

## 2011-08-12 DIAGNOSIS — E785 Hyperlipidemia, unspecified: Secondary | ICD-10-CM | POA: Diagnosis present

## 2011-08-12 DIAGNOSIS — E119 Type 2 diabetes mellitus without complications: Secondary | ICD-10-CM | POA: Diagnosis present

## 2011-08-12 DIAGNOSIS — R270 Ataxia, unspecified: Secondary | ICD-10-CM

## 2011-08-12 DIAGNOSIS — I6789 Other cerebrovascular disease: Secondary | ICD-10-CM | POA: Diagnosis not present

## 2011-08-12 DIAGNOSIS — M79609 Pain in unspecified limb: Secondary | ICD-10-CM | POA: Diagnosis not present

## 2011-08-12 DIAGNOSIS — I10 Essential (primary) hypertension: Secondary | ICD-10-CM | POA: Diagnosis present

## 2011-08-12 DIAGNOSIS — I2510 Atherosclerotic heart disease of native coronary artery without angina pectoris: Secondary | ICD-10-CM | POA: Diagnosis present

## 2011-08-12 DIAGNOSIS — Z95810 Presence of automatic (implantable) cardiac defibrillator: Secondary | ICD-10-CM

## 2011-08-12 DIAGNOSIS — R51 Headache: Secondary | ICD-10-CM | POA: Diagnosis not present

## 2011-08-12 DIAGNOSIS — I517 Cardiomegaly: Secondary | ICD-10-CM | POA: Diagnosis not present

## 2011-08-12 DIAGNOSIS — K219 Gastro-esophageal reflux disease without esophagitis: Secondary | ICD-10-CM | POA: Diagnosis not present

## 2011-08-12 DIAGNOSIS — K635 Polyp of colon: Secondary | ICD-10-CM

## 2011-08-12 DIAGNOSIS — R0789 Other chest pain: Secondary | ICD-10-CM | POA: Diagnosis not present

## 2011-08-12 LAB — URINALYSIS, ROUTINE W REFLEX MICROSCOPIC
Hgb urine dipstick: NEGATIVE
Nitrite: NEGATIVE
Protein, ur: NEGATIVE mg/dL
Specific Gravity, Urine: 1.013 (ref 1.005–1.030)
Urobilinogen, UA: 1 mg/dL (ref 0.0–1.0)

## 2011-08-12 LAB — COMPREHENSIVE METABOLIC PANEL
ALT: 38 U/L (ref 0–53)
AST: 49 U/L — ABNORMAL HIGH (ref 0–37)
Alkaline Phosphatase: 74 U/L (ref 39–117)
CO2: 26 mEq/L (ref 19–32)
Calcium: 9.4 mg/dL (ref 8.4–10.5)
GFR calc Af Amer: 75 mL/min — ABNORMAL LOW (ref >90)
Glucose, Bld: 112 mg/dL — ABNORMAL HIGH (ref 70–99)
Potassium: 4.3 mEq/L (ref 3.5–5.1)
Sodium: 139 mEq/L (ref 135–145)
Total Protein: 6.6 g/dL (ref 6.0–8.3)

## 2011-08-12 LAB — CBC
HCT: 35.4 % — ABNORMAL LOW (ref 39.0–52.0)
MCH: 28.3 pg (ref 26.0–34.0)
MCHC: 32.2 g/dL (ref 30.0–36.0)
MCV: 87.8 fL (ref 78.0–100.0)
Platelets: 172 10*3/uL (ref 150–400)
RDW: 14.7 % (ref 11.5–15.5)

## 2011-08-12 LAB — MRSA PCR SCREENING: MRSA by PCR: POSITIVE — AB

## 2011-08-12 LAB — TROPONIN I: Troponin I: <0.3 ng/mL (ref <0.30)

## 2011-08-12 LAB — CARDIAC PANEL(CRET KIN+CKTOT+MB+TROPI)
CK, MB: 1.6 ng/mL (ref 0.3–4.0)
Total CK: 46 U/L (ref 7–232)

## 2011-08-12 LAB — PROTIME-INR: Prothrombin Time: 30.3 seconds — ABNORMAL HIGH (ref 11.6–15.2)

## 2011-08-12 LAB — DIFFERENTIAL
Eosinophils Absolute: 0.1 10*3/uL (ref 0.0–0.7)
Eosinophils Relative: 1 % (ref 0–5)
Lymphocytes Relative: 21 % (ref 12–46)
Lymphs Abs: 1.7 10*3/uL (ref 0.7–4.0)
Monocytes Absolute: 1.7 10*3/uL — ABNORMAL HIGH (ref 0.1–1.0)

## 2011-08-12 LAB — CK TOTAL AND CKMB (NOT AT ARMC): Total CK: 47 U/L (ref 7–232)

## 2011-08-12 LAB — GLUCOSE, CAPILLARY: Glucose-Capillary: 174 mg/dL — ABNORMAL HIGH (ref 70–99)

## 2011-08-12 MED ORDER — INSULIN ASPART 100 UNIT/ML ~~LOC~~ SOLN: 0.0000 [IU] | Freq: Three times a day (TID) | SUBCUTANEOUS | Status: DC

## 2011-08-12 MED ORDER — ACETAMINOPHEN 650 MG RE SUPP: 650.0000 mg | Freq: Four times a day (QID) | RECTAL | Status: DC | PRN

## 2011-08-12 MED ORDER — ACETAMINOPHEN 325 MG PO TABS: 650.0000 mg | ORAL_TABLET | Freq: Four times a day (QID) | ORAL | Status: DC | PRN

## 2011-08-12 MED ORDER — AMIODARONE HCL 200 MG PO TABS
200.0000 mg | ORAL_TABLET | Freq: Every day | ORAL | Status: DC
  Administered 2011-08-12 – 2011-08-14 (×3): 200 mg via ORAL
  Filled 2011-08-12 (×3): qty 1

## 2011-08-12 MED ORDER — SALINE SPRAY 0.65 % NA SOLN
1.0000 | NASAL | Status: DC | PRN
  Administered 2011-08-13: 1 via NASAL
  Filled 2011-08-12: qty 44

## 2011-08-12 MED ORDER — HYDROCODONE-ACETAMINOPHEN 5-325 MG PO TABS
1.0000 | ORAL_TABLET | Freq: Four times a day (QID) | ORAL | Status: DC | PRN
  Administered 2011-08-12 – 2011-08-14 (×3): 1 via ORAL
  Filled 2011-08-12 (×4): qty 1

## 2011-08-12 MED ORDER — ATORVASTATIN CALCIUM 10 MG PO TABS
10.0000 mg | ORAL_TABLET | Freq: Every day | ORAL | Status: DC
  Administered 2011-08-13: 10 mg via ORAL
  Filled 2011-08-12 (×2): qty 1

## 2011-08-12 MED ORDER — FLUTICASONE PROPIONATE 50 MCG/ACT NA SUSP
2.0000 | Freq: Every day | NASAL | Status: DC
  Administered 2011-08-12 – 2011-08-14 (×3): 2 via NASAL
  Filled 2011-08-12: qty 16

## 2011-08-12 MED ORDER — ONDANSETRON HCL 4 MG/2ML IJ SOLN: 4.0000 mg | Freq: Four times a day (QID) | INTRAMUSCULAR | Status: DC | PRN

## 2011-08-12 MED ORDER — ONDANSETRON HCL 4 MG PO TABS: 4.0000 mg | ORAL_TABLET | Freq: Four times a day (QID) | ORAL | Status: DC | PRN

## 2011-08-12 MED ORDER — SODIUM CHLORIDE 0.9 % IJ SOLN
3.0000 mL | Freq: Two times a day (BID) | INTRAMUSCULAR | Status: DC
  Administered 2011-08-12 – 2011-08-14 (×4): 3 mL via INTRAVENOUS

## 2011-08-12 MED ORDER — CYCLOSPORINE 0.05 % OP EMUL
1.0000 [drp] | Freq: Two times a day (BID) | OPHTHALMIC | Status: DC
  Administered 2011-08-12 – 2011-08-14 (×4): 1 [drp] via OPHTHALMIC
  Filled 2011-08-12 (×6): qty 1

## 2011-08-12 MED ORDER — POTASSIUM CHLORIDE 20 MEQ PO PACK
10.0000 meq | PACK | Freq: Every day | ORAL | Status: DC
Start: 2011-08-12 — End: 2011-08-12
  Filled 2011-08-12: qty 1

## 2011-08-12 MED ORDER — MOMETASONE FURO-FORMOTEROL FUM 100-5 MCG/ACT IN AERO
2.0000 | INHALATION_SPRAY | Freq: Two times a day (BID) | RESPIRATORY_TRACT | Status: DC
  Administered 2011-08-12 – 2011-08-14 (×2): 2 via RESPIRATORY_TRACT
  Filled 2011-08-12: qty 13

## 2011-08-12 MED ORDER — POTASSIUM CHLORIDE CRYS ER 10 MEQ PO TBCR
10.0000 meq | EXTENDED_RELEASE_TABLET | Freq: Every day | ORAL | Status: DC
  Administered 2011-08-12 – 2011-08-13 (×2): 10 meq via ORAL
  Filled 2011-08-12 (×2): qty 1

## 2011-08-12 NOTE — ED Notes (Signed)
Patient is A/A/Ox4, skin is warm and dry, respiration is even and unlabored. 

## 2011-08-12 NOTE — H&P (Signed)
[facility] History and Physical  [name] ZOX:[id number] DOB: 1932/04/22 DOA: 08/12/2011   PCP: Hoyle Sauer, MD   Chief Complaint: headaches and tingling of the left arm.  HPI:  78 year old gentleman with extensive cardiac history, including ischemic cardiomyopathy, cabg, atrial fibrillation on coumadin, has been complaining of headaches since 1 month, and reports pain and tingling sensation of the left forearm since this morning. His sensory symptoms have improved on arrival to ED. He underwent a CT of the head showing acute on chronic subdural hematomas. ER physician has consulted neuro surgery and recommended observation by medical admission and to be consulted as needed if his mental status changes. [facility] is being [facility] and neurology consult was called.    Review of Systems:  Constitutional: Denies ,  Fevers, chills, diaphoresis, appetite change and fatigue.  HEENT: Denies photophobia, eye pain, redness, hearing loss, ear pain, congestion, sore throat, rhinorrhea, sneezing, mouth sores, trouble swallowing, neck stiffness and tinnitus.  Respiratory: denies chest tightness, and wheezing. Complains of DOE, denies pedal edema Cardiovascular: Denies chest pain, palpitations and leg swelling.  Gastrointestinal: Denies nausea, vomiting, abdominal pain, diarrhea, constipation, blood in stool and abdominal distention.  Genitourinary: Denies dysuria,, hematuria, flank pain and difficulty urinating.  Musculoskeletal: Denies myalgias, back pain,  Neurological: Denies  seizures, syncope, weakness, light-headedness, has headaches since 4 weeks, . Tingling and numbness of the left arm and pain in the left arm.  Hematological: Denies adenopathy. personal or family bleeding history  Psychiatric/Behavioral: Denies suicidal ideation, mood changes, confusion, nervousness, sleep disturbance and agitation    Past Medical History  Diagnosis Date  . Chronic  back pain   . Ischemic cardiomyopathy     WITH CHF  . CHF (congestive heart failure)     EF 35-40% s/p most recent ICD generator change-out with Medtronic dual-chamber ICD 05/20/11 with explantation of previous abdominally-implanted device  . Hypertension   . Dyslipidemia   . Erythrocytosis   . GERD (gastroesophageal reflux disease)   . Arthritis   . Abnormal thyroid scan     Abnormal thyroid imaging studies from 11/09/2010, status post ultrasound guided fine needle aspiration of the dominant left inferior thyroid nodule on 12/15/2010. Cytology report showed rare follicular epithelial cells and hemosiderin laden macrophages.  . Coronary artery disease     s/p CABG 1983 and PCI/stent 2004.   Marland Kitchen Headache   . Atrial fibrillation     on chronic Coumadin  . VT (ventricular tachycardia)   . ICD (implantable cardiac defibrillator) in place   . Diabetes mellitus     diet controlled  . Atrial fibrillation    Past Surgical History  Procedure Date  . Cardiac defibrillator placement     replaced April, 2013  . Coronary artery bypass graft 1983  . Cholecystectomy   . Esophagogastroduodenoscopy 02/11/2011    Procedure: ESOPHAGOGASTRODUODENOSCOPY (EGD);  Surgeon: Theda Belfast, MD;  Location: [facility];  Service: Endoscopy;  Laterality: N/A;  . Cardiac catheterization 10/17/06  . Angioplasty     stent placement  . Colonoscopy 07/07/2011    Procedure: COLONOSCOPY;  Surgeon: Beverley Fiedler, MD;  Location: [facility];  Service: Gastroenterology;  Laterality: N/A;   Social History:  reports that he quit smoking about 35 years ago. He has never used smokeless tobacco. He reports that he does not drink alcohol or use illicit drugs.  Allergies  Allergen Reactions  . Celebrex (Celecoxib) Hives    Gi upset  . Digoxin   .  Esomeprazole Magnesium Hives  . Multaq (Dronedarone Hydrochloride)     Family History  Problem Relation Age of Onset  . Heart disease Brother   . Diabetes Sister   .  Diabetes Brother   . Tuberculosis Mother   . Tuberculosis Father   . Clotting disorder Brother     Prior to Admission medications   Medication Sig Start Date End Date Taking? Authorizing Provider  amiodarone (PACERONE) 200 MG tablet Take 1 tablet (200 mg total) by mouth daily. 07/20/11  Yes Peter M Sw[place], MD  aspirin 81 MG tablet Take 2 tablets (162 mg total) by mouth daily. 05/21/11  Yes Dayna N Dunn, PA  bisacodyl (DULCOLAX) 10 MG suppository Place 10 mg rectally as needed. constipation   Yes Historical Provider, MD  calcium carbonate (TUMS - DOSED IN MG ELEMENTAL CALCIUM) 500 MG chewable tablet Chew 3 tablets by mouth as needed. For heart burn   Yes Historical Provider, MD  carvedilol (COREG) 25 MG tablet Take 12.5mg  in the morning and 25mg  in the evening by mouth daily 07/20/11  Yes Peter M [place], MD  cycloSPORINE (RESTASIS) 0.05 % ophthalmic emulsion Place 1 drop into both eyes 2 (two) times daily.   Yes Historical Provider, MD  dexlansoprazole (DEXILANT) 60 MG capsule Take 1 capsule (60 mg total) by mouth daily. 07/20/11  Yes Peter M [place], MD  fluticasone [facility]) 50 MCG/ACT nasal spray Place 2 sprays into the nose daily.    Yes Historical Provider, MD  furosemide (LASIX) 20 MG tablet Take 40 mg by mouth 2 (two) times daily. 06/03/11  Yes Peter M [place], MD  HYDROcodone-acetaminophen (VICODIN) 5-500 MG per tablet Take 1 tablet by mouth every 6 (six) hours as needed. For pain   Yes Historical Provider, MD  lisinopril (PRINIVIL,ZESTRIL) 10 MG tablet Take 1 tablet (10 mg total) by mouth 2 (two) times daily. 07/20/11  Yes Peter M [place], MD  loratadine (CLARITIN) 10 MG tablet Take 10 mg by mouth daily.    Yes Historical Provider, MD  Mometasone Furo-Formoterol Fum (DULERA IN) Inhale 2 puffs into the lungs 2 (two) times daily.    Yes Historical Provider, MD  omega-3 acid ethyl esters (LOVAZA) 1 G capsule Take 1 g by mouth 2 (two) times daily.    Yes Historical Provider, MD  Polyethyl  Glycol-Propyl Glycol (SYSTANE OP) Place 1 drop into both eyes as needed. For dry eyes   Yes Historical Provider, MD  polyethylene glycol (MIRALAX / GLYCOLAX) packet Take 17 g by mouth 2 (two) times daily as needed. For constipation   Yes Historical Provider, MD  potassium chloride (KLOR-CON) 20 MEQ packet Take 10 mEq by mouth daily.   Yes Historical Provider, MD  rosuvastatin (CRESTOR) 5 MG tablet Take 1 tablet (5 mg total) by mouth once a week. 07/20/11  Yes Peter M [place], MD  warfarin (COUMADIN) 5 MG tablet Take 0.5-1 tablets (2.5-5 mg total) by mouth daily. IMPORTANT: Do not restart until Sunday 05/22/11. At that time, you can restart taking your previous dose of 5mg  Tuesday, Saturday, Sunday; 2.5mg  Monday, Wednesday, Thursday, Friday. 05/21/11  Yes Laurann Montana, PA   Physical Exam: Filed Vitals:   08/12/11 1700 08/12/11 1730 08/12/11 1800 08/12/11 1812  BP: 135/43 127/56 120/47 120/47  Pulse: 60 59 66   Temp:      TempSrc:      Resp: 13 18 16 16   Height:      Weight:      SpO2: 97% 98% 100%  97%    Constitutional: Vital signs reviewed.  Patient is a well-developed and well-nourished  in no acute distress and cooperative with exam. Alert and oriented x3.  Head: Normocephalic and atraumatic Mouth: no erythema or exudates, MMM Eyes: PERRL, EOMI, conjunctivae normal, No scleral icterus.  Neck: Supple, Trachea midline normal ROM, No JVD, mass, thyromegaly, or carotid bruit present.  Cardiovascular: RRR, S1 normal, S2 normal, no MRG, pulses symmetric and intact bilaterally Pulmonary/Chest: CTAB, no wheezes, rales, or rhonchi Abdominal: Soft. Non-tender, non-distended, bowel sounds are normal, no masses, organomegaly, or guarding present.  Musculoskeletal: No joint deformities, erythema, or stiffness, ROM full and no nontender Neurological: A&O x3, Strength is normal and symmetric bilaterally, cranial nerve II-XII are grossly intact, no focal motor deficit, sensory intact to light touch  bilaterally.  Skin: Warm, dry and intact. Diffuse petechiae and scattered bruises. Psychiatric: Normal mood and affect.  Labs on Admission:  Basic Metabolic Panel:  Lab 08/12/11 9562  NA 139  K 4.3  CL 101  CO2 26  GLUCOSE 112*  BUN 20  CREATININE 1.08  CALCIUM 9.4  MG --  PHOS --   Liver Function Tests:  Lab 08/12/11 1603  AST 49*  ALT 38  ALKPHOS 74  BILITOT 0.4  PROT 6.6  ALBUMIN 3.7   No results found for this basename: LIPASE:5,AMYLASE:5 in the last 168 hours No results found for this basename: AMMONIA:5 in the last 168 hours CBC:  Lab 08/12/11 1603  WBC 8.0  NEUTROABS 4.5  HGB 11.4*  HCT 35.4*  MCV 87.8  PLT 172   Cardiac Enzymes:  Lab 08/12/11 1604  CKTOTAL 47  CKMB 1.8  CKMBINDEX --  TROPONINI <0.30   BNP: No components found with this basename: POCBNP:5 CBG: No results found for this basename: GLUCAP:5 in the last 168 hours  Radiological Exams on Admission: Dg Chest 1 View  08/12/2011  *RADIOLOGY REPORT*  Clinical Data: Chest pressure and numbness since yesterday, history of coronary disease post CABG and defibrillator placement, diabetes, hypertension, CHF, ischemic cardiomyopathy  CHEST - 1 VIEW  Comparison: 05/21/2011  Findings: Left subclavian AICD with leads projecting over right atrium and right ventricle. Epicardial pacing leads and epicardial defibrillator leads identified. Minimal enlargement of cardiac silhouette with pulmonary vascular congestion post CABG. Atherosclerotic calcification aortic arch. Mediastinal contours otherwise normal. No definite pulmonary infiltrate, pleural effusion or pneumothorax. Bones unremarkable.  IMPRESSION: Post cardiac procedures as above with minimal enlargement of cardiac silhouette and pulmonary vascular congestion. No acute abnormalities.  Original Report Authenticated By: Lollie Marrow, M.D.   Ct Head Wo Contrast  08/12/2011  *RADIOLOGY REPORT*  Clinical Data: Headache and dizziness.  CT HEAD WITHOUT  CONTRAST  Technique:  Contiguous axial images were obtained from the base of the skull through the vertex without contrast.  Comparison: None.  Findings: The patient has bilateral extra-axial fluid collections which are mixed attenuation, predominately mid and low attenuation. Small foci of increased attenuation are seen over the frontal convexities bilaterally.  The collections measure up to both extra- axial fluid collections measure approximate 0.9 cm in thickness. There is mass effect on the cerebral convexities but no midline shift.  No intraparenchymal hemorrhage, infarct or mass identified. There is no hydrocephalus.  The calvarium is intact.  IMPRESSION: Findings consistent with subacute / chronic bilateral subdural hematomas with small areas of more acute hemorrhage identified bilaterally.  Critical Value/emergent results were called by telephone at the time of interpretation on 08/12/2011 at 4:10 p.m. to Dr. Rhunette Croft,  who verbally acknowledged these results.  Original Report Authenticated By: Bernadene Bell. D'ALESSIO, M.D.    EKG: SINUS BRADY WITH T WAVE ABN.  Assessment/Plan Active Problems: 1. Acute on chronic subdural hematomas: - admit to step down for observation. - neuro consult - holding coumadin an d aspirin and scd's for DVT prophylaxis. - neuro checks - TIA work up with MRI/ MRA of the head and neck - echo and carotid duplex.  2. Ischemic Cardiomyopathy with CABG s/p ventricular defibrillator:  - holding aspirin  - resume coreg and statin  3. Atrial fibrillation: on coumadin at home Rate controlled.  INR IS 2.8. We will watch overnight without vitamin K, if there are any acute changes, will give him FFP'S. Continue with amiodarone.   4.  Diet controlled DM: SSI  5. DVT prophylaxis: scd's   Dorothye Berni [facility] Pager [phone]  If 7PM-7AM, please contact night-coverage [email or web address] Password [facility] 08/12/2011, 7:06 PM

## 2011-08-12 NOTE — ED Notes (Signed)
Patient stated that the numbness and tingling to the lt arm is better.

## 2011-08-12 NOTE — ED Notes (Addendum)
Patient was brought in by ambulance with complaint of lt arm numbness, tingling with pressure pain onset at 0400 this morning. Pt denies any chest discomfort but complains of exertional shortness of breath x 3 days. Patient also stated that he's had the headache for a while now. Patient stated that he has cervical problems and thinks that it is causing his headache. Pt is A/A/Ox4, skin is warm and dry, respiration is even and unlabored.

## 2011-08-12 NOTE — ED Provider Notes (Signed)
History     CSN: [id number]  Arrival date & time 08/12/11  1145   First MD Initiated Contact with Patient 08/12/11 1314      Chief Complaint  Patient presents with  . Numbness    (Consider location/radiation/quality/duration/timing/severity/associated sxs/prior treatment) HPI Comments: PMHx of Ischemic cardiomyopathy with history of CHF/VT s/p ICD, Coronary artery disease s/p CABG 1983 and PCI/stent 2004, Atrial fibrillation on coumadin, VT on amiodarone, HTN, Erythrocytosis who comes in to the ED with cc of left sided numbness and some headaches.  Pt has been having headaches for the past 2-3 weeks. The headaches are frontal, intermittent, but long lasting, and without any photo or phonophobia. There is no associated visual change, gait instability, nausea, emesis. Pt also has numbness to his left shoulder area and some pain in his forearm - that started this morning when he woke up. The pain has improved, the numbness has persisted. He has no known hx of strokes.  ROs is negative for any chest pain, n/v/f/c, but + for exertional SOB that is worse over the past 2-3 weeks. Patient at baseline is able to walk 2 blocks. Also pt denies any trauma/falls.  The history is provided by the patient and medical records.    Past Medical History  Diagnosis Date  . Chronic back pain   . Ischemic cardiomyopathy     WITH CHF  . CHF (congestive heart failure)     EF 35-40% s/p most recent ICD generator change-out with Medtronic dual-chamber ICD 05/20/11 with explantation of previous abdominally-implanted device  . Hypertension   . Dyslipidemia   . Erythrocytosis   . GERD (gastroesophageal reflux disease)   . Arthritis   . Abnormal thyroid scan     Abnormal thyroid imaging studies from 11/09/2010, status post ultrasound guided fine needle aspiration of the dominant left inferior thyroid nodule on 12/15/2010. Cytology report showed rare follicular epithelial cells and hemosiderin laden macrophages.   . Coronary artery disease     s/p CABG 1983 and PCI/stent 2004.   Marland Kitchen Headache   . Atrial fibrillation     on chronic Coumadin  . VT (ventricular tachycardia)   . ICD (implantable cardiac defibrillator) in place   . Diabetes mellitus     diet controlled  . Atrial fibrillation     Past Surgical History  Procedure Date  . Cardiac defibrillator placement     replaced April, 2013  . Coronary artery bypass graft 1983  . Cholecystectomy   . Esophagogastroduodenoscopy 02/11/2011    Procedure: ESOPHAGOGASTRODUODENOSCOPY (EGD);  Surgeon: Theda Belfast, MD;  Location: [facility];  Service: Endoscopy;  Laterality: N/A;  . Cardiac catheterization 10/17/06  . Angioplasty     stent placement  . Colonoscopy 07/07/2011    Procedure: COLONOSCOPY;  Surgeon: Beverley Fiedler, MD;  Location: [facility];  Service: Gastroenterology;  Laterality: N/A;    Family History  Problem Relation Age of Onset  . Heart disease Brother   . Diabetes Sister   . Diabetes Brother   . Tuberculosis Mother   . Tuberculosis Father   . Clotting disorder Brother     History  Substance Use Topics  . Smoking status: Former Smoker    Quit date: 06/23/1976  . Smokeless tobacco: Never Used  . Alcohol Use: No      Review of Systems  Constitutional: Negative for fever, chills and activity change.  HENT: Negative for facial swelling and neck pain.   Eyes: Negative for visual disturbance.  Respiratory: Positive  for shortness of breath. Negative for cough and chest tightness.   Cardiovascular: Negative for chest pain.  Gastrointestinal: Negative for abdominal pain, blood in stool and abdominal distention.  Genitourinary: Negative for dysuria, enuresis and difficulty urinating.  Musculoskeletal: Negative for arthralgias and gait problem.  Skin: Negative for pallor.  Neurological: Positive for numbness. Negative for dizziness, seizures, speech difficulty, weakness, light-headedness and headaches.  Hematological:  Bruises/bleeds easily.  Psychiatric/Behavioral: Negative for confusion.    Allergies  Celebrex; Digoxin; Esomeprazole magnesium; and Multaq  Home Medications   Current Outpatient Rx  Name Route Sig Dispense Refill  . AMIODARONE HCL 200 MG PO TABS Oral Take 1 tablet (200 mg total) by mouth daily. 90 tablet 3  . ASPIRIN 81 MG PO TABS Oral Take 2 tablets (162 mg total) by mouth daily.      IMPORTANT: Do not restart until Sunday 05/22/11.  Marland Kitchen BISACODYL 10 MG RE SUPP Rectal Place 10 mg rectally as needed. constipation    . CALCIUM CARBONATE ANTACID 500 MG PO CHEW Oral Chew 3 tablets by mouth as needed. For heart burn    . CARVEDILOL 25 MG PO TABS  Take 12.5mg  in the morning and 25mg  in the evening by mouth daily 180 tablet 3  . CYCLOSPORINE 0.05 % OP EMUL Both Eyes Place 1 drop into both eyes 2 (two) times daily.    . DEXLANSOPRAZOLE 60 MG PO CPDR Oral Take 1 capsule (60 mg total) by mouth daily. 90 capsule 3  . FLUTICASONE PROPIONATE 50 MCG/ACT NA SUSP Nasal Place 2 sprays into the nose daily.     . FUROSEMIDE 20 MG PO TABS Oral Take 40 mg by mouth 2 (two) times daily.    Marland Kitchen HYDROCODONE-ACETAMINOPHEN 5-500 MG PO TABS Oral Take 1 tablet by mouth every 6 (six) hours as needed. For pain    . LISINOPRIL 10 MG PO TABS Oral Take 1 tablet (10 mg total) by mouth 2 (two) times daily. 180 tablet 3  . LORATADINE 10 MG PO TABS Oral Take 10 mg by mouth daily.     Elwin Sleight IN Inhalation Inhale 2 puffs into the lungs 2 (two) times daily.     . OMEGA-3-ACID ETHYL ESTERS 1 G PO CAPS Oral Take 1 g by mouth 2 (two) times daily.     Frazier Butt OP Both Eyes Place 1 drop into both eyes as needed. For dry eyes    . POLYETHYLENE GLYCOL 3350 PO PACK Oral Take 17 g by mouth 2 (two) times daily as needed. For constipation    . POTASSIUM CHLORIDE 20 MEQ PO PACK Oral Take 10 mEq by mouth daily.    Marland Kitchen ROSUVASTATIN CALCIUM 5 MG PO TABS Oral Take 1 tablet (5 mg total) by mouth once a week. 5 tablet 3  . WARFARIN SODIUM 5 MG PO  TABS Oral Take 0.5-1 tablets (2.5-5 mg total) by mouth daily. IMPORTANT: Do not restart until Sunday 05/22/11. At that time, you can restart taking your previous dose of 5mg  Tuesday, Saturday, Sunday; 2.5mg  Monday, Wednesday, Thursday, Friday.      BP 105/42  Pulse 55  Temp 98.2 F (36.8 C) (Oral)  Resp 13  Ht 6' (1.829 m)  Wt 183 lb (83.008 kg)  BMI 24.82 kg/m2  SpO2 98%  Physical Exam  Constitutional: He is oriented to person, place, and time. He appears well-developed.  HENT:  Head: Normocephalic and atraumatic.  Eyes: Conjunctivae and EOM are normal. Pupils are equal, round, and reactive to light.  Headache worse with gaze of left eye  Neck: Normal range of motion. Neck supple.  Cardiovascular: Normal rate, regular rhythm and normal heart sounds.   Pulmonary/Chest: Effort normal and breath sounds normal. No respiratory distress. He has no wheezes.  Abdominal: Soft. Bowel sounds are normal. He exhibits no distension. There is no tenderness. There is no rebound and no guarding.  Neurological: He is alert and oriented to person, place, and time. He displays normal reflexes. A cranial nerve deficit is present. He exhibits normal muscle tone. Coordination normal.       Cerebellar exam normal. Pt has subjective numbness - minimal in the LUE around the shoulder. No neck pain, pain not reproduced with movement of the neck or upper extremity  Skin: Skin is warm.    ED Course  Procedures (including critical care time)  Labs Reviewed  PROTIME-INR - Abnormal; Notable for the following:    Prothrombin Time 30.3 (*)     INR 2.84 (*)     All other components within normal limits  APTT - Abnormal; Notable for the following:    aPTT 45 (*)     All other components within normal limits  COMPREHENSIVE METABOLIC PANEL - Abnormal; Notable for the following:    Glucose, Bld 112 (*)     AST 49 (*)     GFR calc non Af Amer 65 (*)     GFR calc Af Amer 75 (*)     All other components  within normal limits  DIFFERENTIAL - Abnormal; Notable for the following:    Monocytes Relative 21 (*)     Monocytes Absolute 1.7 (*)     All other components within normal limits  CBC - Abnormal; Notable for the following:    RBC 4.03 (*)     Hemoglobin 11.4 (*)     HCT 35.4 (*)     All other components within normal limits  CK TOTAL AND CKMB  TROPONIN I  PRO B NATRIURETIC PEPTIDE   Dg Chest 1 View  08/12/2011  *RADIOLOGY REPORT*  Clinical Data: Chest pressure and numbness since yesterday, history of coronary disease post CABG and defibrillator placement, diabetes, hypertension, CHF, ischemic cardiomyopathy  CHEST - 1 VIEW  Comparison: 05/21/2011  Findings: Left subclavian AICD with leads projecting over right atrium and right ventricle. Epicardial pacing leads and epicardial defibrillator leads identified. Minimal enlargement of cardiac silhouette with pulmonary vascular congestion post CABG. Atherosclerotic calcification aortic arch. Mediastinal contours otherwise normal. No definite pulmonary infiltrate, pleural effusion or pneumothorax. Bones unremarkable.  IMPRESSION: Post cardiac procedures as above with minimal enlargement of cardiac silhouette and pulmonary vascular congestion. No acute abnormalities.  Original Report Authenticated By: Lollie Marrow, M.D.   Ct Head Wo Contrast  08/12/2011  *RADIOLOGY REPORT*  Clinical Data: Headache and dizziness.  CT HEAD WITHOUT CONTRAST  Technique:  Contiguous axial images were obtained from the base of the skull through the vertex without contrast.  Comparison: None.  Findings: The patient has bilateral extra-axial fluid collections which are mixed attenuation, predominately mid and low attenuation. Small foci of increased attenuation are seen over the frontal convexities bilaterally.  The collections measure up to both extra- axial fluid collections measure approximate 0.9 cm in thickness. There is mass effect on the cerebral convexities but no  midline shift.  No intraparenchymal hemorrhage, infarct or mass identified. There is no hydrocephalus.  The calvarium is intact.  IMPRESSION: Findings consistent with subacute / chronic bilateral subdural hematomas with small areas of  more acute hemorrhage identified bilaterally.  Critical Value/emergent results were called by telephone at the time of interpretation on 08/12/2011 at 4:10 p.m. to Dr. Rhunette Croft, who verbally acknowledged these results.  Original Report Authenticated By: Bernadene Bell. Maricela Curet, M.D.     No diagnosis found.    MDM  Pt with significant co-morbidities, including CAD, dysrhythmia, CHF comes in w/ cc of headache, numbness. DDx includes stroke, ICH at this time. The headache is not severe, it is intermittent and has been going on for few days now, so temporal arteritis is low on the ddx. We will get CT head to start. The neurologic complains don't fit a specific dermatome or vascular territory. Given the risk factors, we still think stroke is on the ddx, and he might need Neurology consultations.  Pt also has some sob that is worsening. Could be CHF, worsening lung disease or angina equivalent. Will get EKG and trops.  Likely admission.   Date: 08/12/2011  Rate: 56  Rhythm: Junctional  QRS Axis: normal  Intervals: normal  ST/T Wave abnormalities: normal  Conduction Disutrbances:none  Narrative Interpretation:   Old EKG Reviewed: unchanged  5:42 PM Pt's CT revealed acute on chronic SDH. INR is 2.84. Neurosurgery consulted, they reviewed the images and feel patient can be watched by  The hospitalist service and coumadin can be discontinued. Will admit.          Derwood Kaplan, MD 08/12/11 2045

## 2011-08-12 NOTE — Progress Notes (Signed)
Pt's MRI is being canceled due to pt having an AICD. Spoke With pt's RN @ 2000 hours.

## 2011-08-13 DIAGNOSIS — I4891 Unspecified atrial fibrillation: Secondary | ICD-10-CM | POA: Insufficient documentation

## 2011-08-13 DIAGNOSIS — I5022 Chronic systolic (congestive) heart failure: Secondary | ICD-10-CM

## 2011-08-13 DIAGNOSIS — I6350 Cerebral infarction due to unspecified occlusion or stenosis of unspecified cerebral artery: Secondary | ICD-10-CM

## 2011-08-13 DIAGNOSIS — I517 Cardiomegaly: Secondary | ICD-10-CM

## 2011-08-13 DIAGNOSIS — Z7901 Long term (current) use of anticoagulants: Secondary | ICD-10-CM

## 2011-08-13 LAB — GLUCOSE, CAPILLARY
Glucose-Capillary: 104 mg/dL — ABNORMAL HIGH (ref 70–99)
Glucose-Capillary: 107 mg/dL — ABNORMAL HIGH (ref 70–99)
Glucose-Capillary: 98 mg/dL (ref 70–99)

## 2011-08-13 LAB — CBC
MCHC: 32.1 g/dL (ref 30.0–36.0)
Platelets: 172 10*3/uL (ref 150–400)
RDW: 15 % (ref 11.5–15.5)
WBC: 6.6 10*3/uL (ref 4.0–10.5)

## 2011-08-13 LAB — COMPREHENSIVE METABOLIC PANEL
ALT: 32 U/L (ref 0–53)
Alkaline Phosphatase: 64 U/L (ref 39–117)
BUN: 16 mg/dL (ref 6–23)
CO2: 28 mEq/L (ref 19–32)
GFR calc Af Amer: >90 mL/min (ref >90)
GFR calc non Af Amer: 79 mL/min — ABNORMAL LOW (ref >90)
Glucose, Bld: 96 mg/dL (ref 70–99)
Potassium: 3.9 mEq/L (ref 3.5–5.1)
Sodium: 144 mEq/L (ref 135–145)
Total Bilirubin: 0.4 mg/dL (ref 0.3–1.2)
Total Protein: 6.3 g/dL (ref 6.0–8.3)

## 2011-08-13 LAB — LIPID PANEL
Cholesterol: 118 mg/dL (ref 0–200)
LDL Cholesterol: 62 mg/dL (ref 0–99)
Total CHOL/HDL Ratio: 3.9 RATIO
Triglycerides: 130 mg/dL (ref <150)
VLDL: 26 mg/dL (ref 0–40)

## 2011-08-13 LAB — HEMOGLOBIN A1C
Hgb A1c MFr Bld: 5.6 % (ref <5.7)
Mean Plasma Glucose: 114 mg/dL (ref <117)

## 2011-08-13 LAB — CARDIAC PANEL(CRET KIN+CKTOT+MB+TROPI)
CK, MB: 1.8 ng/mL (ref 0.3–4.0)
Relative Index: INVALID (ref 0.0–2.5)
Total CK: 43 U/L (ref 7–232)

## 2011-08-13 LAB — TSH: TSH: 0.073 u[IU]/mL — ABNORMAL LOW (ref 0.350–4.500)

## 2011-08-13 MED ORDER — CARVEDILOL 3.125 MG PO TABS
3.1250 mg | ORAL_TABLET | Freq: Two times a day (BID) | ORAL | Status: DC
  Administered 2011-08-13 – 2011-08-14 (×2): 3.125 mg via ORAL
  Filled 2011-08-13 (×4): qty 1

## 2011-08-13 MED ORDER — OMEGA-3-ACID ETHYL ESTERS 1 G PO CAPS
1.0000 g | ORAL_CAPSULE | Freq: Two times a day (BID) | ORAL | Status: DC
  Administered 2011-08-13 – 2011-08-14 (×2): 1 g via ORAL
  Filled 2011-08-13 (×4): qty 1

## 2011-08-13 MED ORDER — BISACODYL 10 MG RE SUPP: 10.0000 mg | RECTAL | Status: DC | PRN

## 2011-08-13 MED ORDER — CHLORHEXIDINE GLUCONATE CLOTH 2 % EX PADS
6.0000 | MEDICATED_PAD | Freq: Every day | CUTANEOUS | Status: DC
  Administered 2011-08-13 – 2011-08-14 (×2): 6 via TOPICAL

## 2011-08-13 MED ORDER — POLYETHYLENE GLYCOL 3350 17 G PO PACK: 17.0000 g | PACK | Freq: Two times a day (BID) | ORAL | Status: DC | PRN

## 2011-08-13 MED ORDER — MUPIROCIN 2 % EX OINT
1.0000 "application " | TOPICAL_OINTMENT | Freq: Two times a day (BID) | CUTANEOUS | Status: DC
  Administered 2011-08-13 – 2011-08-14 (×3): 1 via NASAL
  Filled 2011-08-13: qty 22

## 2011-08-13 NOTE — Progress Notes (Signed)
VASCULAR LAB PRELIMINARY  PRELIMINARY  PRELIMINARY  PRELIMINARY  Carotid Dopplers completed.    Preliminary report:  There is no ICA stenosis.  Vertebral artery flow is antegrade.  Shaniece Bussa, 08/13/2011, 11:22 AM

## 2011-08-13 NOTE — Consult Note (Signed)
[facility] CONSULT NOTE     Reason for Consult: Bilateral acute on chronic subdural hematomas.   CC: Headaches and LUE tingling.    HPI:    [name] is an 78 y.o. male who presents with a one month history of headaches and new onset of pain and paresthesias of the left forearm beginning on 08/12/11. The patient's sensory symptoms had improved at the time of the initial hospitalist evaluation in the ED, however, CT of head revealed acute on chronic subdural hematomas bilaterally. Neurosurgery recommended observation with emergent consultation if mental status changes.   The patient has a history of ischemic cardiomyopathy, CABG, and atrial fibrillation on Coumadin.   The patient's CT exam was personally reviewed. I agree with the radiologist's report, including the following: "The patient has bilateral extra-axial fluid collections which are mixed attenuation, predominately mid and low attenuation. Small foci of increased attenuation are seen over the frontal convexities bilaterally. The collections measure up to both extra-axial fluid collections measure approximate 0.9 cm in thickness. There is mass effect on the cerebral convexities but no midline shift."   Past Medical History  Diagnosis Date  . Chronic back pain   . Ischemic cardiomyopathy     WITH CHF  . CHF (congestive heart failure)     EF 35-40% s/p most recent ICD generator change-out with [employer] dual-chamber ICD 05/20/11 with explantation of previous abdominally-implanted device  . Hypertension   . Dyslipidemia   . Erythrocytosis   . GERD (gastroesophageal reflux disease)   . Arthritis   . Abnormal thyroid scan     Abnormal thyroid imaging studies from 11/09/2010, status post ultrasound guided fine needle aspiration of the dominant left inferior thyroid nodule on 12/15/2010. Cytology report showed rare follicular epithelial cells and hemosiderin laden macrophages.  . Coronary artery  disease     s/p CABG 1983 and PCI/stent 2004.   Marland Kitchen Headache   . Atrial fibrillation     on chronic Coumadin  . VT (ventricular tachycardia)   . ICD (implantable cardiac defibrillator) in place   . Diabetes mellitus     diet controlled  . Atrial fibrillation     Past Surgical History  Procedure Date  . Cardiac defibrillator placement     replaced April, 2013  . Coronary artery bypass graft 1983  . Cholecystectomy   . Esophagogastroduodenoscopy 02/11/2011    Procedure: ESOPHAGOGASTRODUODENOSCOPY (EGD);  Surgeon: Theda Belfast, MD;  Location: [facility];  Service: Endoscopy;  Laterality: N/A;  . Cardiac catheterization 10/17/06  . Angioplasty     stent placement  . Colonoscopy 07/07/2011    Procedure: COLONOSCOPY;  Surgeon: Beverley Fiedler, MD;  Location: [facility];  Service: Gastroenterology;  Laterality: N/A;    Family History  Problem Relation Age of Onset  . Heart disease Brother   . Diabetes Sister   . Diabetes Brother   . Tuberculosis Mother   . Tuberculosis Father   . Clotting disorder Brother     Social History:  reports that he quit smoking about 35 years ago. He has never used smokeless tobacco. He reports that he does not drink alcohol or use illicit drugs.  Allergies  Allergen Reactions  . Celebrex (Celecoxib) Hives    Gi upset  . Digoxin   . Esomeprazole Magnesium Hives  . Multaq (Dronedarone Hydrochloride)     Medications:  Current facility-administered medications:acetaminophen (TYLENOL) suppository 650 mg, 650 mg, Rectal, Q6H PRN, Kathlen Mody, MD;  acetaminophen (TYLENOL) tablet 650 mg, 650 mg, Oral, Q6H PRN, Kathlen Mody, MD;  amiodarone (PACERONE) tablet 200 mg, 200 mg, Oral, Daily, Kathlen Mody, MD, 200 mg at 08/12/11 2132;  atorvastatin (LIPITOR) tablet 10 mg, 10 mg, Oral, q1800, Kathlen Mody, MD Chlorhexidine Gluconate Cloth 2 % PADS 6 each, 6 each, Topical, Q0600, Kathlen Mody, MD;  cycloSPORINE (RESTASIS) 0.05 % ophthalmic emulsion 1 drop, 1  drop, Both Eyes, BID, Kathlen Mody, MD, 1 drop at 08/13/11 0722;  fluticasone (FLONASE) 50 MCG/ACT nasal spray 2 spray, 2 spray, Each Nare, Daily, Kathlen Mody, MD, 2 spray at 08/12/11 2132 HYDROcodone-acetaminophen (NORCO) 5-325 MG per tablet 1 tablet, 1 tablet, Oral, Q6H PRN, Rolan Lipa, NP, 1 tablet at 08/12/11 2224;  insulin aspart (novoLOG) injection 0-9 Units, 0-9 Units, Subcutaneous, TID WC, Kathlen Mody, MD;  mometasone-formoterol (DULERA) inhaler 2 puff, 2 puff, Inhalation, BID, Kathlen Mody, MD, 2 puff at 08/12/11 2105;  mupirocin ointment (BACTROBAN) 2 % 1 application, 1 application, Nasal, BID, Kathlen Mody, MD ondansetron (ZOFRAN) injection 4 mg, 4 mg, Intravenous, Q6H PRN, Kathlen Mody, MD;  ondansetron (ZOFRAN) tablet 4 mg, 4 mg, Oral, Q6H PRN, Kathlen Mody, MD;  potassium chloride (K-DUR,KLOR-CON) CR tablet 10 mEq, 10 mEq, Oral, Daily, Kathlen Mody, MD, 10 mEq at 08/12/11 2131;  sodium chloride (OCEAN) 0.65 % nasal spray 1 spray, 1 spray, Each Nare, PRN, Rolan Lipa, NP sodium chloride 0.9 % injection 3 mL, 3 mL, Intravenous, Q12H, Kathlen Mody, MD, 3 mL at 08/12/11 2131;  DISCONTD: potassium chloride (KLOR-CON) packet 10 mEq, 10 mEq, Oral, Daily, Kathlen Mody, MD   Review of Systems - As per HPI.   Blood pressure 123/56, pulse 55, temperature 97.8 F (36.6 C), temperature source Oral, resp. rate 13, height 6' (1.829 m), weight 83.4 kg (183 lb 13.8 oz), SpO2 97.00%.   Neurologic Examination:   Mental Status: Alert. Somewhat delayed responses to orientation questions - oriented to city, state, year, "June...no, July" and "Sunday".  Speech fluent with one subtle error on repetition. Naming intact to common words with some deficit naming with uncommon words. Able to follow all commands. Abstraction is concrete.  Cranial Nerves: II-Visual fields intact to bedside confrontation testing. III/IV/VI-Extraocular movements intact.  Pupils reactive bilaterally. Ptosis  not present. V/VII-Smile symmetric VIII-intact to conversation.  IX/X-mildly hypophonic XII-midline tongue extension Motor: 5/5 bilaterally with normal tone and bulk. There is mild pronator drift on the left.  Sensory: Light touch intact bilaterally, without extinction.  Deep Tendon Reflexes: 1+ and symmetric throughout.     Cerebellar: Normal finger-to-nose bilaterally.    Gait: Deferred.     Lab Results  Component Value Date/Time   CHOL 118 08/13/2011  3:45 AM    Results for orders placed during the hospital encounter of 08/12/11 (from the past 48 hour(s))  PROTIME-INR     Status: Abnormal   Collection Time   08/12/11  4:03 PM      Component Value Range Comment   Prothrombin Time 30.3 (*) 11.6 - 15.2 seconds    INR 2.84 (*) 0.00 - 1.49   APTT     Status: Abnormal   Collection Time   08/12/11  4:03 PM      Component Value Range Comment   aPTT 45 (*) 24 - 37 seconds   COMPREHENSIVE METABOLIC PANEL     Status: Abnormal   Collection Time   08/12/11  4:03  PM      Component Value Range Comment   Sodium 139  135 - 145 mEq/L    Potassium 4.3  3.5 - 5.1 mEq/L    Chloride 101  96 - 112 mEq/L    CO2 26  19 - 32 mEq/L    Glucose, Bld 112 (*) 70 - 99 mg/dL    BUN 20  6 - 23 mg/dL    Creatinine, Ser 9.60  0.50 - 1.35 mg/dL    Calcium 9.4  8.4 - 45.4 mg/dL    Total Protein 6.6  6.0 - 8.3 g/dL    Albumin 3.7  3.5 - 5.2 g/dL    AST 49 (*) 0 - 37 U/L    ALT 38  0 - 53 U/L    Alkaline Phosphatase 74  39 - 117 U/L    Total Bilirubin 0.4  0.3 - 1.2 mg/dL    GFR calc non Af Amer 65 (*) >90 mL/min    GFR calc Af Amer 75 (*) >90 mL/min   DIFFERENTIAL     Status: Abnormal   Collection Time   08/12/11  4:03 PM      Component Value Range Comment   Neutrophils Relative 56  43 - 77 %    Neutro Abs 4.5  1.7 - 7.7 K/uL    Lymphocytes Relative 21  12 - 46 %    Lymphs Abs 1.7  0.7 - 4.0 K/uL    Monocytes Relative 21 (*) 3 - 12 %    Monocytes Absolute 1.7 (*) 0.1 - 1.0 K/uL    Eosinophils  Relative 1  0 - 5 %    Eosinophils Absolute 0.1  0.0 - 0.7 K/uL    Basophils Relative 0  0 - 1 %    Basophils Absolute 0.0  0.0 - 0.1 K/uL   CBC     Status: Abnormal   Collection Time   08/12/11  4:03 PM      Component Value Range Comment   WBC 8.0  4.0 - 10.5 K/uL    RBC 4.03 (*) 4.22 - 5.81 MIL/uL    Hemoglobin 11.4 (*) 13.0 - 17.0 g/dL    HCT 09.8 (*) 11.9 - 52.0 %    MCV 87.8  78.0 - 100.0 fL    MCH 28.3  26.0 - 34.0 pg    MCHC 32.2  30.0 - 36.0 g/dL    RDW 14.7  82.9 - 56.2 %    Platelets 172  150 - 400 K/uL   CK TOTAL AND CKMB     Status: Normal   Collection Time   08/12/11  4:04 PM      Component Value Range Comment   Total CK 47  7 - 232 U/L    CK, MB 1.8  0.3 - 4.0 ng/mL    Relative Index RELATIVE INDEX IS INVALID  0.0 - 2.5   TROPONIN I     Status: Normal   Collection Time   08/12/11  4:04 PM      Component Value Range Comment   Troponin I <0.30  <0.30 ng/mL   PRO B NATRIURETIC PEPTIDE     Status: Normal   Collection Time   08/12/11  4:04 PM      Component Value Range Comment   Pro B Natriuretic peptide (BNP) 422.7  0 - 450 pg/mL   TSH     Status: Abnormal   Collection Time   08/12/11  8:30 PM  Component Value Range Comment   TSH 0.073 (*) 0.350 - 4.500 uIU/mL   CARDIAC PANEL(CRET KIN+CKTOT+MB+TROPI)     Status: Normal   Collection Time   08/12/11  8:30 PM      Component Value Range Comment   Total CK 46  7 - 232 U/L    CK, MB 1.6  0.3 - 4.0 ng/mL    Troponin I <0.30  <0.30 ng/mL    Relative Index RELATIVE INDEX IS INVALID  0.0 - 2.5   HEMOGLOBIN A1C     Status: Normal   Collection Time   08/12/11  8:30 PM      Component Value Range Comment   Hemoglobin A1C 5.6  <5.7 %    Mean Plasma Glucose 114  <117 mg/dL   URINALYSIS, ROUTINE W REFLEX MICROSCOPIC     Status: Normal   Collection Time   08/12/11  8:36 PM      Component Value Range Comment   Color, Urine YELLOW  YELLOW    APPearance CLEAR  CLEAR    Specific Gravity, Urine 1.013  1.005 - 1.030    pH  8.0  5.0 - 8.0    Glucose, UA NEGATIVE  NEGATIVE mg/dL    Hgb urine dipstick NEGATIVE  NEGATIVE    Bilirubin Urine NEGATIVE  NEGATIVE    Ketones, ur NEGATIVE  NEGATIVE mg/dL    Protein, ur NEGATIVE  NEGATIVE mg/dL    Urobilinogen, UA 1.0  0.0 - 1.0 mg/dL    Nitrite NEGATIVE  NEGATIVE    Leukocytes, UA NEGATIVE  NEGATIVE MICROSCOPIC NOT DONE ON URINES WITH NEGATIVE PROTEIN, BLOOD, LEUKOCYTES, NITRITE, OR GLUCOSE <1000 mg/dL.  MRSA PCR SCREENING     Status: Abnormal   Collection Time   08/12/11  8:37 PM      Component Value Range Comment   MRSA by PCR POSITIVE (*) NEGATIVE   GLUCOSE, CAPILLARY     Status: Abnormal   Collection Time   08/12/11 10:00 PM      Component Value Range Comment   Glucose-Capillary 174 (*) 70 - 99 mg/dL    Comment 1 Notify RN      Comment 2 Documented in Chart     COMPREHENSIVE METABOLIC PANEL     Status: Abnormal   Collection Time   08/13/11  3:45 AM      Component Value Range Comment   Sodium 144  135 - 145 mEq/L    Potassium 3.9  3.5 - 5.1 mEq/L    Chloride 108  96 - 112 mEq/L    CO2 28  19 - 32 mEq/L    Glucose, Bld 96  70 - 99 mg/dL    BUN 16  6 - 23 mg/dL    Creatinine, Ser 7.82  0.50 - 1.35 mg/dL    Calcium 9.1  8.4 - 95.6 mg/dL    Total Protein 6.3  6.0 - 8.3 g/dL    Albumin 3.4 (*) 3.5 - 5.2 g/dL    AST 41 (*) 0 - 37 U/L    ALT 32  0 - 53 U/L    Alkaline Phosphatase 64  39 - 117 U/L    Total Bilirubin 0.4  0.3 - 1.2 mg/dL    GFR calc non Af Amer 79 (*) >90 mL/min    GFR calc Af Amer >90  >90 mL/min   CBC     Status: Abnormal   Collection Time   08/13/11  3:45 AM      Component  Value Range Comment   WBC 6.6  4.0 - 10.5 K/uL    RBC 3.85 (*) 4.22 - 5.81 MIL/uL    Hemoglobin 10.9 (*) 13.0 - 17.0 g/dL    HCT 16.1 (*) 09.6 - 52.0 %    MCV 88.3  78.0 - 100.0 fL    MCH 28.3  26.0 - 34.0 pg    MCHC 32.1  30.0 - 36.0 g/dL    RDW 04.5  40.9 - 81.1 %    Platelets 172  150 - 400 K/uL   LIPID PANEL     Status: Abnormal   Collection Time   08/13/11   3:45 AM      Component Value Range Comment   Cholesterol 118  0 - 200 mg/dL    Triglycerides 914  <782 mg/dL    HDL 30 (*) >95 mg/dL    Total CHOL/HDL Ratio 3.9      VLDL 26  0 - 40 mg/dL    LDL Cholesterol 62  0 - 99 mg/dL     Dg Chest 1 View  07/21/3084  *RADIOLOGY REPORT*  Clinical Data: Chest pressure and numbness since yesterday, history of coronary disease post CABG and defibrillator placement, diabetes, hypertension, CHF, ischemic cardiomyopathy  CHEST - 1 VIEW  Comparison: 05/21/2011  Findings: Left subclavian AICD with leads projecting over right atrium and right ventricle. Epicardial pacing leads and epicardial defibrillator leads identified. Minimal enlargement of cardiac silhouette with pulmonary vascular congestion post CABG. Atherosclerotic calcification aortic arch. Mediastinal contours otherwise normal. No definite pulmonary infiltrate, pleural effusion or pneumothorax. Bones unremarkable.  IMPRESSION: Post cardiac procedures as above with minimal enlargement of cardiac silhouette and pulmonary vascular congestion. No acute abnormalities.  Original Report Authenticated By: Lollie Marrow, M.D.   Ct Head Wo Contrast  08/12/2011  *RADIOLOGY REPORT*  Clinical Data: Headache and dizziness.  CT HEAD WITHOUT CONTRAST  Technique:  Contiguous axial images were obtained from the base of the skull through the vertex without contrast.  Comparison: None.  Findings: The patient has bilateral extra-axial fluid collections which are mixed attenuation, predominately mid and low attenuation. Small foci of increased attenuation are seen over the frontal convexities bilaterally.  The collections measure up to both extra- axial fluid collections measure approximate 0.9 cm in thickness. There is mass effect on the cerebral convexities but no midline shift.  No intraparenchymal hemorrhage, infarct or mass identified. There is no hydrocephalus.  The calvarium is intact.  IMPRESSION: Findings consistent with  subacute / chronic bilateral subdural hematomas with small areas of more acute hemorrhage identified bilaterally.  Critical Value/emergent results were called by telephone at the time of interpretation on 08/12/2011 at 4:10 p.m. to Dr. Rhunette Croft, who verbally acknowledged these results.  Original Report Authenticated By: Bernadene Bell. Maricela Curet, M.D.     Assessment/Plan:   Assessment: 1. Prominent bilateral chronic subdural hygromas exhibiting CT findings consistent with recent rebleeding.  2. Mild deficits on cognitive exam. Most likely due to the subdural hygromas. An underlying incipient degenerative dementia is also possible.  3. Possible TIA with left UE symptoms. More likely the new symptoms are due to rebleeding within the right sided subdural hygroma. 4. Atrial fibrillation, on Coumadin as outpatient. Benefits of stroke prevention with anticoagulation are outweighed by the risks of further rebleeding within the subdural hygromas.  5. Low TSH level. History of left thyroid nodule.   Recommendations: 1. Agree with holding coumadin and ASA. Agree with SCD's for DVT prophylaxis.  2. Agree with obtaining MRI  of brain, MRA of head, MRA of neck, as well as echocardiogram and carotid ultrasound. However, given the subdural hygromas, management unlikely to be changed (except in some cases, such as cardiac mural thrombus or hemodynamically significant carotid stenosis). 3. Evaluation and management of possible hyperthyroidism. Hyperthyroidism predisposes to atrial fibrillation.  4. Will need close outpatient neurology follow up.   Electronically signed: Dr. Caryl Pina

## 2011-08-13 NOTE — Progress Notes (Signed)
  Echocardiogram 2D Echocardiogram has been performed.  [name] 08/13/2011, 1:08 PM

## 2011-08-13 NOTE — Evaluation (Signed)
Occupational Therapy Evaluation Patient Details Name: [name] MRN: [id number] DOB: 10-25-1932 Today's Date: 08/13/2011 Time: 0865-7846 OT Time Calculation (min): 26 min  OT Assessment / Plan / Recommendation Clinical Impression  This 78 year old male was admitted with SDH.  He initially experienced numbness in LUE.  Pt was mod I with all ADLs/IADLs including driving. He is now overall supervision/set up level and min guard to ambulate.  He is appropriate for skilled OT to increase independence with adls and mobility.      OT Assessment  Patient needs continued OT Services    Follow Up Recommendations  Supervision/Assistance - 24 hour;Home health OT    Barriers to Discharge      Equipment Recommendations  None recommended by OT;None recommended by PT    Recommendations for Other Services    Frequency  Min 2X/week    Precautions / Restrictions Precautions Precautions: Fall Restrictions Weight Bearing Restrictions: No   Pertinent Vitals/Pain No pain 97% RA, VSS    ADL  Eating/Feeding: Simulated;Independent Where Assessed - Eating/Feeding: Chair Grooming: Simulated;Supervision/safety Where Assessed - Grooming: Supported standing Upper Body Bathing: Simulated;Set up Where Assessed - Upper Body Bathing: Unsupported sitting Lower Body Bathing: Simulated;Supervision/safety Where Assessed - Lower Body Bathing: Supported sit to stand Upper Body Dressing: Simulated;Minimal assistance (lines) Where Assessed - Upper Body Dressing: Unsupported sitting Lower Body Dressing: Simulated;Performed;Supervision/safety (set up for socks) Where Assessed - Lower Body Dressing: Supported sit to Pharmacist, hospital: Simulated;Min guard (to bed and then recliner) Toilet Transfer Method:  (ambulate) Toileting - Architect and Hygiene: Simulated;Supervision/safety Where Assessed - Engineer, mining and Hygiene: Sit to stand from 3-in-1 or toilet Equipment Used:  Cane Transfers/Ambulation Related to ADLs: stood at toilet with supervision; min guard for ambulation to bathroom    OT Diagnosis: Generalized weakness  OT Problem List: Decreased activity tolerance;Impaired balance (sitting and/or standing);Decreased strength OT Treatment Interventions: Self-care/ADL training;DME and/or AE instruction;Balance training;Patient/family education   OT Goals Acute Rehab OT Goals OT Goal Formulation: With patient Time For Goal Achievement: 08/27/11 Potential to Achieve Goals: Good ADL Goals Pt Will Transfer to Toilet: with supervision;Ambulation;Comfort height toilet;Grab bars ADL Goal: Toilet Transfer - Progress: Goal set today Pt Will Perform Tub/Shower Transfer: Shower transfer;Ambulation;Shower seat with back;with supervision ADL Goal: Web designer - Progress: Goal set today Miscellaneous OT Goals Miscellaneous OT Goal #1: Pt will gather clothes/supplies for adls at supervision level OT Goal: Miscellaneous Goal #1 - Progress: Goal set today  Visit Information  Last OT Received On: 08/13/11 Assistance Needed: +1 PT/OT Co-Evaluation/Treatment: Yes (overlapped)    Subjective Data  Subjective: "My daughter is probably on her way here" Patient Stated Goal: get back to baseline   Prior Functioning  Vision/Perception  Home Living Lives With: Alone Available Help at Discharge: Other (Comment) (daughter may be able to come ) Type of Home: Apartment Home Access: Level entry Home Layout: One level Bathroom Shower/Tub: Walk-in shower;Door [employer] Toilet: Handicapped height (with grab bar) Bathroom Accessibility: Yes How Accessible: Accessible via walker Home Adaptive Equipment: Straight cane;Walker - rolling;Shower chair with back Prior Function Level of Independence: Independent with assistive device(s) (cane) Able to Take Stairs?: Yes Driving: Yes Vocation: Retired Musician: No difficulties Dominant Hand: Right       Cognition  Overall Cognitive Status: Appears within functional limits for tasks assessed/performed Arousal/Alertness: Awake/alert Orientation Level: Oriented X4 / Intact Behavior During Session: [facility] for tasks performed    Extremity/Trunk Assessment Right Upper Extremity Assessment RUE ROM/Strength/Tone: Within functional levels  Left Upper Extremity Assessment LUE ROM/Strength/Tone: Within functional levels LUE Sensation:  (still has some numbness by shoulder)   Mobility Transfers Transfers: Sit to Stand Sit to Stand: 5: Supervision;From bed   Exercise    Balance Balance Balance Assessed: Yes Dynamic Standing Balance Dynamic Standing - Balance Support: No upper extremity supported;Right upper extremity supported Dynamic Standing - Level of Assistance: 5: Stand by assistance  End of Session OT - End of Session Equipment Utilized During Treatment: Gait belt Activity Tolerance: Patient tolerated treatment well Patient left: in chair;with call bell/phone within reach  GO     Keyaira Clapham 08/13/2011, 3:56 PM Marica Otter, OTR/L [phone] 08/13/2011

## 2011-08-13 NOTE — Evaluation (Signed)
Physical Therapy Evaluation Patient Details Name: [name] MRN: [id number] DOB: [birth year] Today's Date: 08/13/2011 Time: 4098-1191 PT Time Calculation (min): 22 min  PT Assessment / Plan / Recommendation Clinical Impression  Pt presents with a medical diagnosis of SDH. Pt is near baseline functional level. Spoke with pt regarding discharge plans and he agreed with the need for supervision intially upon d/c. Will continue to reasess in further sessions for balance. Pt will benefit from skilled PT in the acute care setting in order to improve functional mobility and balance for a safe d/c home    PT Assessment  Patient needs continued PT services    Follow Up Recommendations  No PT follow up;Supervision/Assistance - 24 hour    Barriers to Discharge Decreased caregiver support daughter may be able to be at home    Equipment Recommendations  None recommended by OT;None recommended by PT    Recommendations for Other Services     Frequency Min 3X/week    Precautions / Restrictions Precautions Precautions: Fall Restrictions Weight Bearing Restrictions: No         Mobility  Bed Mobility Bed Mobility: Supine to Sit;Sitting - Scoot to Edge of Bed Supine to Sit: 5: Supervision Sitting - Scoot to Edge of Bed: 5: Supervision Details for Bed Mobility Assistance: VC for sequencing. Pt slow to get out of bed, although no physical assist needed Transfers Transfers: Sit to Stand;Stand to Sit Sit to Stand: 5: Supervision;From bed Stand to Sit: 5: Supervision;With upper extremity assist;To chair/3-in-1 Details for Transfer Assistance: VC for hand placement and sequencing. Ambulation/Gait Ambulation/Gait Assistance: 4: Min guard Ambulation Distance (Feet): 200 Feet Assistive device: Straight cane Ambulation/Gait Assistance Details: Minguard for ambulation secondary to pt with slight unsteadinesss. According to pt, no increase in instability since PTA. Gait Pattern: Step-to  pattern;Narrow base of support;Decreased hip/knee flexion - right;Decreased stride length Gait velocity: decreased gait speed    Exercises     PT Diagnosis: Difficulty walking  PT Problem List: Decreased activity tolerance;Decreased balance;Decreased mobility;Decreased knowledge of use of DME;Decreased safety awareness;Decreased knowledge of precautions PT Treatment Interventions: DME instruction;Gait training;Functional mobility training;Therapeutic activities;Therapeutic exercise;Balance training;Neuromuscular re-education;Patient/family education   PT Goals Acute Rehab PT Goals PT Goal Formulation: With patient Time For Goal Achievement: 08/20/11 Potential to Achieve Goals: Good Pt will go Supine/Side to Sit: with modified independence PT Goal: Supine/Side to Sit - Progress: Goal set today Pt will go Sit to Supine/Side: with modified independence PT Goal: Sit to Supine/Side - Progress: Goal set today Pt will go Sit to Stand: with modified independence PT Goal: Sit to Stand - Progress: Goal set today Pt will go Stand to Sit: with modified independence PT Goal: Stand to Sit - Progress: Goal set today Pt will Transfer Bed to Chair/Chair to Bed: with supervision PT Transfer Goal: Bed to Chair/Chair to Bed - Progress: Goal set today Pt will Ambulate: >150 feet;with supervision;with least restrictive assistive device PT Goal: Ambulate - Progress: Goal set today  Visit Information  Last PT Received On: 08/13/11 Assistance Needed: +1    Subjective Data      Prior Functioning  Home Living Lives With: Alone Available Help at Discharge: Other (Comment) (daughter may be able to come ) Type of Home: Apartment Home Access: Level entry Home Layout: One level Bathroom Shower/Tub: Walk-in shower;Door [employer] Toilet: Handicapped height (with grab bar) Bathroom Accessibility: Yes How Accessible: Accessible via walker Home Adaptive Equipment: Straight cane;Walker - rolling;Shower chair  with back Prior Function Level of Independence: Independent  with assistive device(s) (cane) Able to Take Stairs?: Yes Driving: Yes Vocation: Retired Musician: No difficulties Dominant Hand: Right    Cognition  Overall Cognitive Status: Appears within functional limits for tasks assessed/performed Arousal/Alertness: Awake/alert Orientation Level: Oriented X4 / Intact Behavior During Session: WFL for tasks performed    Extremity/Trunk Assessment Right Upper Extremity Assessment RUE ROM/Strength/Tone: Within functional levels Left Upper Extremity Assessment LUE ROM/Strength/Tone: Within functional levels LUE Sensation:  (still has some numbness by shoulder) Right Lower Extremity Assessment RLE ROM/Strength/Tone: Within functional levels RLE Sensation: WFL - Light Touch Left Lower Extremity Assessment LLE ROM/Strength/Tone: Within functional levels LLE Sensation: WFL - Light Touch   Balance Balance Balance Assessed: Yes Dynamic Standing Balance Dynamic Standing - Balance Support: No upper extremity supported;Right upper extremity supported Dynamic Standing - Level of Assistance: 5: Stand by assistance Dynamic Standing - Comments: Pt stood to use urinal with SBA for safety  End of Session PT - End of Session Equipment Utilized During Treatment: Gait belt Activity Tolerance: Patient tolerated treatment well Patient left: in chair;with call bell/phone within reach Nurse Communication: Mobility status    Milana Kidney 08/13/2011, 5:01 PM  08/13/2011 Milana Kidney DPT PAGER: [phone] OFFICE: [phone]

## 2011-08-13 NOTE — Progress Notes (Signed)
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 Neurology is following along - currently holding anticoagulants - patient appears to be clinically stable - will obtain followup CT scan of the head after an additional 24 hours  Possible right brain TIA Unable to accomplish MRI or MRA due to pacer/defibrillator - echocardiogram and carotid Dopplers have been ordered - may need to consider CT angiogram of the head if there is significant concern that the patient suffered a TIA - I agree with neurology however that his symptoms are likely directly related to his hygromas  Chronic systolic congestive heart failure / ischemic cardiomyopathy Ejection fraction 35-40% - clinically well compensated at present time - followed by [facility] cardiology as an outpatient  Ventricular tachycardia status post AICD implantation April 2013 Followed by Dr. Sharrell Ku with [facility]  Hypertension Currently well-controlled  Dyslipidemia Appears to be well-controlled with home medication regimen  Normocytic anemia Follow the hemoglobin trend  Known coronary artery disease  status post CABG in 1983 and PCI with stent 2004  Chronic atrial fibrillation on Coumadin and amiodarone Rate is currently well-controlled - in the setting we must of course hold anticoagulation - the patient has been informed of the attendant increased risk of thromboembolic stroke without anticoagulation but presently his risk of rebleeding is certainly too high to warrant use of anticoagulation  Diabetes mellitus Reportedly diet controlled as outpatient - indeed A1c is 5.6 - CBGs are reasonably controlled at present  Suppressed TSH Free T4 will be assessed - patient has a history of a possible thyroid nodule  Code Status: Full Family  Communication: Direct physician patient communication Disposition Plan: Stable for transfer to neurology floor on telemetry  Brief narrative: 78 year old gentleman with extensive cardiac history, including ischemic cardiomyopathy, cabg, atrial fibrillation on coumadin, has been complaining of headaches since 1 month, who reported pain and tingling sensation of the left forearm as of 08/12/2011.  He underwent a CT of the head showing acute on chronic subdural hematomas.  Consultants: Neurology  Procedures: Transthoracic echocardiogram 08/13/2011 Bilateral carotid artery Dopplers 08/13/2011  Antibiotics: None  HPI/Subjective: Patient is resting comfortably in a bedside chair.  He is alert oriented and quite pleasant.  He reports he does not presently have a headache.  He feels the paresthesias in his left arm are much improved.  He denies chest pain shortness of breath fevers chills nausea or vomiting.   Objective: Blood pressure 109/60, pulse 55, temperature 97.8 F (36.6 C), temperature source Oral, resp. rate 18, height 6' (1.829 m), weight 83.4 kg (183 lb 13.8 oz), SpO2 97.00%.  Intake/Output Summary (Last 24 hours) at 08/13/11 1656 Last data filed at 08/13/11 1200  Gross per 24 hour  Intake    720 ml  Output   1250 ml  Net   -530 ml     Exam: General: No acute respiratory distress Lungs: Clear to auscultation bilaterally without wheezes or crackles Cardiovascular: Regular rate without murmur gallop or rub Abdomen: Nontender, nondistended, soft, bowel sounds positive, no rebound, no ascites, no appreciable mass Extremities: No significant cyanosis, clubbing, or edema bilateral lower extremities Neurologic:  Alert and oriented x4, cranial nerves II through XII intact bilaterally, 5 over 5 strength bilateral upper and lower extremities  Data Reviewed: Basic Metabolic Panel:  Lab 08/13/11 7829 08/12/11 1603  NA 144 139  K 3.9 4.3  CL 108 101  CO2 28 26  GLUCOSE 96 112*   BUN 16 20  CREATININE 0.96 1.08  CALCIUM 9.1 9.4  MG -- --  PHOS -- --   Liver Function Tests:  Lab 08/13/11 0345 08/12/11 1603  AST 41* 49*  ALT 32 38  ALKPHOS 64 74  BILITOT 0.4 0.4  PROT 6.3 6.6  ALBUMIN 3.4* 3.7   CBC:  Lab 08/13/11 0345 08/12/11 1603  WBC 6.6 8.0  NEUTROABS -- 4.5  HGB 10.9* 11.4*  HCT 34.0* 35.4*  MCV 88.3 87.8  PLT 172 172   Cardiac Enzymes:  Lab 08/13/11 1200 08/13/11 0800 08/12/11 2030 08/12/11 1604  CKTOTAL 54 43 46 47  CKMB 1.8 1.8 1.6 1.8  CKMBINDEX -- -- -- --  TROPONINI <0.30 <0.30 <0.30 <0.30   CBG:  Lab 08/13/11 1203 08/13/11 0759 08/12/11 2200  GLUCAP 104* 104* 174*    Recent Results (from the past 240 hour(s))  MRSA PCR SCREENING     Status: Abnormal   Collection Time   08/12/11  8:37 PM      Component Value Range Status Comment   MRSA by PCR POSITIVE (*) NEGATIVE Final      Studies:  Recent x-ray studies have been reviewed in detail by the Attending Physician  Scheduled Meds:  Reviewed in detail by the Attending Physician   Lonia Blood, MD [facility] Office  [phone] Pager [phone]  On-Call/Text Page:      [email or web address]      password [email or web address]  If 7PM-7AM, please contact night-coverage [email or web address] Password [email or web address] 08/13/2011, 4:56 PM   LOS: 1 day

## 2011-08-13 NOTE — Progress Notes (Signed)
SLP Cancellation Note ST received Evaluation and Treat order. Evaluation deferred 08/14/11. Moreen Fowler MS, CCC-SLP  [name] 08/13/2011, 4:01 PM

## 2011-08-14 DIAGNOSIS — K5520 Angiodysplasia of colon without hemorrhage: Secondary | ICD-10-CM

## 2011-08-14 DIAGNOSIS — R279 Unspecified lack of coordination: Secondary | ICD-10-CM

## 2011-08-14 DIAGNOSIS — D509 Iron deficiency anemia, unspecified: Secondary | ICD-10-CM

## 2011-08-14 LAB — CBC
Hemoglobin: 11.8 g/dL — ABNORMAL LOW (ref 13.0–17.0)
MCH: 28.2 pg (ref 26.0–34.0)
MCHC: 31.7 g/dL (ref 30.0–36.0)
MCV: 89 fL (ref 78.0–100.0)
Platelets: 176 10*3/uL (ref 150–400)

## 2011-08-14 LAB — T4, FREE: Free T4: 1.83 ng/dL — ABNORMAL HIGH (ref 0.80–1.80)

## 2011-08-14 NOTE — Progress Notes (Signed)
Patient is refusing to wear his SCD's.  He stated that "they make his feet go numb."  I assured him that they were not too tight but he is still refusing them. I explained to him how they worked and that they help reduce the occurrence of blood clots.  He is still refusing.

## 2011-08-14 NOTE — Progress Notes (Signed)
Subjective: No complaints today. States he is ready for discharge.   Objective: Current vital signs: BP 143/65  Pulse 57  Temp 98.2 F (36.8 C) (Oral)  Resp 18  Ht 6' (1.829 m)  Wt 83.4 kg (183 lb 13.8 oz)  BMI 24.94 kg/m2  SpO2 100% Vital signs in last 24 hours: Temp:  [97.1 F (36.2 C)-98.2 F (36.8 C)] 98.2 F (36.8 C) (07/14 0945) Pulse Rate:  [55-60] 57  (07/14 0945) Resp:  [15-20] 18  (07/14 0945) BP: (116-143)/(51-65) 143/65 mmHg (07/14 0945) SpO2:  [97 %-100 %] 100 % (07/14 0945)  Intake/Output from previous day: 07/13 0701 - 07/14 0700 In: 600 [P.O.:600] Out: 875 [Urine:875] Intake/Output this shift:   Nutritional status: Cardiac  Neurologic Exam: Ment: Alert and oriented. Thought content appropriate. Speech fluent with intact comprehension.  CN: Eyes conjugate. Tracks normally. Facies symmetric. Phonation intact.  Motor: Moves all 4 extremities equally.  Cerebellar: No ataxia noted.   Lab Results: Results for orders placed during the hospital encounter of 08/12/11 (from the past 48 hour(s))  PROTIME-INR     Status: Abnormal   Collection Time   08/12/11  4:03 PM      Component Value Range Comment   Prothrombin Time 30.3 (*) 11.6 - 15.2 seconds    INR 2.84 (*) 0.00 - 1.49   APTT     Status: Abnormal   Collection Time   08/12/11  4:03 PM      Component Value Range Comment   aPTT 45 (*) 24 - 37 seconds   COMPREHENSIVE METABOLIC PANEL     Status: Abnormal   Collection Time   08/12/11  4:03 PM      Component Value Range Comment   Sodium 139  135 - 145 mEq/L    Potassium 4.3  3.5 - 5.1 mEq/L    Chloride 101  96 - 112 mEq/L    CO2 26  19 - 32 mEq/L    Glucose, Bld 112 (*) 70 - 99 mg/dL    BUN 20  6 - 23 mg/dL    Creatinine, Ser 1.61  0.50 - 1.35 mg/dL    Calcium 9.4  8.4 - 09.6 mg/dL    Total Protein 6.6  6.0 - 8.3 g/dL    Albumin 3.7  3.5 - 5.2 g/dL    AST 49 (*) 0 - 37 U/L    ALT 38  0 - 53 U/L    Alkaline Phosphatase 74  39 - 117 U/L    Total  Bilirubin 0.4  0.3 - 1.2 mg/dL    GFR calc non Af Amer 65 (*) >90 mL/min    GFR calc Af Amer 75 (*) >90 mL/min   DIFFERENTIAL     Status: Abnormal   Collection Time   08/12/11  4:03 PM      Component Value Range Comment   Neutrophils Relative 56  43 - 77 %    Neutro Abs 4.5  1.7 - 7.7 K/uL    Lymphocytes Relative 21  12 - 46 %    Lymphs Abs 1.7  0.7 - 4.0 K/uL    Monocytes Relative 21 (*) 3 - 12 %    Monocytes Absolute 1.7 (*) 0.1 - 1.0 K/uL    Eosinophils Relative 1  0 - 5 %    Eosinophils Absolute 0.1  0.0 - 0.7 K/uL    Basophils Relative 0  0 - 1 %    Basophils Absolute 0.0  0.0 - 0.1 K/uL  CBC     Status: Abnormal   Collection Time   08/12/11  4:03 PM      Component Value Range Comment   WBC 8.0  4.0 - 10.5 K/uL    RBC 4.03 (*) 4.22 - 5.81 MIL/uL    Hemoglobin 11.4 (*) 13.0 - 17.0 g/dL    HCT 16.1 (*) 09.6 - 52.0 %    MCV 87.8  78.0 - 100.0 fL    MCH 28.3  26.0 - 34.0 pg    MCHC 32.2  30.0 - 36.0 g/dL    RDW 04.5  40.9 - 81.1 %    Platelets 172  150 - 400 K/uL   CK TOTAL AND CKMB     Status: Normal   Collection Time   08/12/11  4:04 PM      Component Value Range Comment   Total CK 47  7 - 232 U/L    CK, MB 1.8  0.3 - 4.0 ng/mL    Relative Index RELATIVE INDEX IS INVALID  0.0 - 2.5   TROPONIN I     Status: Normal   Collection Time   08/12/11  4:04 PM      Component Value Range Comment   Troponin I <0.30  <0.30 ng/mL   PRO B NATRIURETIC PEPTIDE     Status: Normal   Collection Time   08/12/11  4:04 PM      Component Value Range Comment   Pro B Natriuretic peptide (BNP) 422.7  0 - 450 pg/mL   TSH     Status: Abnormal   Collection Time   08/12/11  8:30 PM      Component Value Range Comment   TSH 0.073 (*) 0.350 - 4.500 uIU/mL   CARDIAC PANEL(CRET KIN+CKTOT+MB+TROPI)     Status: Normal   Collection Time   08/12/11  8:30 PM      Component Value Range Comment   Total CK 46  7 - 232 U/L    CK, MB 1.6  0.3 - 4.0 ng/mL    Troponin I <0.30  <0.30 ng/mL    Relative Index  RELATIVE INDEX IS INVALID  0.0 - 2.5   HEMOGLOBIN A1C     Status: Normal   Collection Time   08/12/11  8:30 PM      Component Value Range Comment   Hemoglobin A1C 5.6  <5.7 %    Mean Plasma Glucose 114  <117 mg/dL   URINALYSIS, ROUTINE W REFLEX MICROSCOPIC     Status: Normal   Collection Time   08/12/11  8:36 PM      Component Value Range Comment   Color, Urine YELLOW  YELLOW    APPearance CLEAR  CLEAR    Specific Gravity, Urine 1.013  1.005 - 1.030    pH 8.0  5.0 - 8.0    Glucose, UA NEGATIVE  NEGATIVE mg/dL    Hgb urine dipstick NEGATIVE  NEGATIVE    Bilirubin Urine NEGATIVE  NEGATIVE    Ketones, ur NEGATIVE  NEGATIVE mg/dL    Protein, ur NEGATIVE  NEGATIVE mg/dL    Urobilinogen, UA 1.0  0.0 - 1.0 mg/dL    Nitrite NEGATIVE  NEGATIVE    Leukocytes, UA NEGATIVE  NEGATIVE MICROSCOPIC NOT DONE ON URINES WITH NEGATIVE PROTEIN, BLOOD, LEUKOCYTES, NITRITE, OR GLUCOSE <1000 mg/dL.  MRSA PCR SCREENING     Status: Abnormal   Collection Time   08/12/11  8:37 PM      Component Value Range Comment  MRSA by PCR POSITIVE (*) NEGATIVE   GLUCOSE, CAPILLARY     Status: Abnormal   Collection Time   08/12/11 10:00 PM      Component Value Range Comment   Glucose-Capillary 174 (*) 70 - 99 mg/dL    Comment 1 Notify RN      Comment 2 Documented in Chart     COMPREHENSIVE METABOLIC PANEL     Status: Abnormal   Collection Time   08/13/11  3:45 AM      Component Value Range Comment   Sodium 144  135 - 145 mEq/L    Potassium 3.9  3.5 - 5.1 mEq/L    Chloride 108  96 - 112 mEq/L    CO2 28  19 - 32 mEq/L    Glucose, Bld 96  70 - 99 mg/dL    BUN 16  6 - 23 mg/dL    Creatinine, Ser 4.09  0.50 - 1.35 mg/dL    Calcium 9.1  8.4 - 81.1 mg/dL    Total Protein 6.3  6.0 - 8.3 g/dL    Albumin 3.4 (*) 3.5 - 5.2 g/dL    AST 41 (*) 0 - 37 U/L    ALT 32  0 - 53 U/L    Alkaline Phosphatase 64  39 - 117 U/L    Total Bilirubin 0.4  0.3 - 1.2 mg/dL    GFR calc non Af Amer 79 (*) >90 mL/min    GFR calc Af Amer >90   >90 mL/min   CBC     Status: Abnormal   Collection Time   08/13/11  3:45 AM      Component Value Range Comment   WBC 6.6  4.0 - 10.5 K/uL    RBC 3.85 (*) 4.22 - 5.81 MIL/uL    Hemoglobin 10.9 (*) 13.0 - 17.0 g/dL    HCT 91.4 (*) 78.2 - 52.0 %    MCV 88.3  78.0 - 100.0 fL    MCH 28.3  26.0 - 34.0 pg    MCHC 32.1  30.0 - 36.0 g/dL    RDW 95.6  21.3 - 08.6 %    Platelets 172  150 - 400 K/uL   LIPID PANEL     Status: Abnormal   Collection Time   08/13/11  3:45 AM      Component Value Range Comment   Cholesterol 118  0 - 200 mg/dL    Triglycerides 578  <469 mg/dL    HDL 30 (*) >62 mg/dL    Total CHOL/HDL Ratio 3.9      VLDL 26  0 - 40 mg/dL    LDL Cholesterol 62  0 - 99 mg/dL   GLUCOSE, CAPILLARY     Status: Abnormal   Collection Time   08/13/11  7:59 AM      Component Value Range Comment   Glucose-Capillary 104 (*) 70 - 99 mg/dL    Comment 1 Notify RN     CARDIAC PANEL(CRET KIN+CKTOT+MB+TROPI)     Status: Normal   Collection Time   08/13/11  8:00 AM      Component Value Range Comment   Total CK 43  7 - 232 U/L    CK, MB 1.8  0.3 - 4.0 ng/mL    Troponin I <0.30  <0.30 ng/mL    Relative Index RELATIVE INDEX IS INVALID  0.0 - 2.5   CARDIAC PANEL(CRET KIN+CKTOT+MB+TROPI)     Status: Normal   Collection Time   08/13/11 12:00  PM      Component Value Range Comment   Total CK 54  7 - 232 U/L    CK, MB 1.8  0.3 - 4.0 ng/mL    Troponin I <0.30  <0.30 ng/mL    Relative Index RELATIVE INDEX IS INVALID  0.0 - 2.5   GLUCOSE, CAPILLARY     Status: Abnormal   Collection Time   08/13/11 12:03 PM      Component Value Range Comment   Glucose-Capillary 104 (*) 70 - 99 mg/dL    Comment 1 Notify RN     GLUCOSE, CAPILLARY     Status: Abnormal   Collection Time   08/13/11  5:08 PM      Component Value Range Comment   Glucose-Capillary 107 (*) 70 - 99 mg/dL    Comment 1 Notify RN     GLUCOSE, CAPILLARY     Status: Normal   Collection Time   08/13/11  9:50 PM      Component Value Range  Comment   Glucose-Capillary 98  70 - 99 mg/dL   CBC     Status: Abnormal   Collection Time   08/14/11  5:00 AM      Component Value Range Comment   WBC 7.2  4.0 - 10.5 K/uL    RBC 4.18 (*) 4.22 - 5.81 MIL/uL    Hemoglobin 11.8 (*) 13.0 - 17.0 g/dL    HCT 13.0 (*) 86.5 - 52.0 %    MCV 89.0  78.0 - 100.0 fL    MCH 28.2  26.0 - 34.0 pg    MCHC 31.7  30.0 - 36.0 g/dL    RDW 78.4  69.6 - 29.5 %    Platelets 176  150 - 400 K/uL   GLUCOSE, CAPILLARY     Status: Abnormal   Collection Time   08/14/11  7:18 AM      Component Value Range Comment   Glucose-Capillary 119 (*) 70 - 99 mg/dL   GLUCOSE, CAPILLARY     Status: Abnormal   Collection Time   08/14/11 11:06 AM      Component Value Range Comment   Glucose-Capillary 108 (*) 70 - 99 mg/dL     Recent Results (from the past 240 hour(s))  MRSA PCR SCREENING     Status: Abnormal   Collection Time   08/12/11  8:37 PM      Component Value Range Status Comment   MRSA by PCR POSITIVE (*) NEGATIVE Final     Lipid Panel  Basename 08/13/11 0345  CHOL 118  TRIG 130  HDL 30*  CHOLHDL 3.9  VLDL 26  LDLCALC 62    Studies/Results: Dg Chest 1 View  08/12/2011  *RADIOLOGY REPORT*  Clinical Data: Chest pressure and numbness since yesterday, history of coronary disease post CABG and defibrillator placement, diabetes, hypertension, CHF, ischemic cardiomyopathy  CHEST - 1 VIEW  Comparison: 05/21/2011  Findings: Left subclavian AICD with leads projecting over right atrium and right ventricle. Epicardial pacing leads and epicardial defibrillator leads identified. Minimal enlargement of cardiac silhouette with pulmonary vascular congestion post CABG. Atherosclerotic calcification aortic arch. Mediastinal contours otherwise normal. No definite pulmonary infiltrate, pleural effusion or pneumothorax. Bones unremarkable.  IMPRESSION: Post cardiac procedures as above with minimal enlargement of cardiac silhouette and pulmonary vascular congestion. No acute  abnormalities.  Original Report Authenticated By: Lollie Marrow, M.D.   Ct Head Wo Contrast  08/12/2011  *RADIOLOGY REPORT*  Clinical Data: Headache and dizziness.  CT HEAD  WITHOUT CONTRAST  Technique:  Contiguous axial images were obtained from the base of the skull through the vertex without contrast.  Comparison: None.  Findings: The patient has bilateral extra-axial fluid collections which are mixed attenuation, predominately mid and low attenuation. Small foci of increased attenuation are seen over the frontal convexities bilaterally.  The collections measure up to both extra- axial fluid collections measure approximate 0.9 cm in thickness. There is mass effect on the cerebral convexities but no midline shift.  No intraparenchymal hemorrhage, infarct or mass identified. There is no hydrocephalus.  The calvarium is intact.  IMPRESSION: Findings consistent with subacute / chronic bilateral subdural hematomas with small areas of more acute hemorrhage identified bilaterally.  Critical Value/emergent results were called by telephone at the time of interpretation on 08/12/2011 at 4:10 p.m. to Dr. Rhunette Croft, who verbally acknowledged these results.  Original Report Authenticated By: Bernadene Bell. Maricela Curet, M.D.    Medications:  Scheduled:   . amiodarone  200 mg Oral Daily  . atorvastatin  10 mg Oral q1800  . carvedilol  3.125 mg Oral BID WC  . Chlorhexidine Gluconate Cloth  6 each Topical Q0600  . cycloSPORINE  1 drop Both Eyes BID  . fluticasone  2 spray Each Nare Daily  . insulin aspart  0-9 Units Subcutaneous TID WC  . mometasone-formoterol  2 puff Inhalation BID  . mupirocin ointment  1 application Nasal BID  . omega-3 acid ethyl esters  1 g Oral BID  . sodium chloride  3 mL Intravenous Q12H  . DISCONTD: potassium chloride  10 mEq Oral Daily    Assessment/Plan:  1. Prominent bilateral chronic subdural hygromas exhibiting CT findings consistent with recent rebleeding. Agree with holding  coumadin and ASA. Agree with SCD's for DVT prophylaxis.  2. Possible TIA with left UE symptoms. More likely the new symptoms are due to rebleeding within the right-sided subdural hygroma. TIA due to embolic phenomenon also possible given his atrial fibrillation. Was on Coumadin as outpatient. Benefits of stroke prevention with anticoagulation are outweighed by the risks of further rebleeding within the subdural hygromas. Unable to obtain MRI due to defibrillator. TTE was negative for mural thrombus. Carotid ultrasound negative for significant stenosis. 3. Mild deficits on cognitive exam. Most likely due to the subdural hygromas. An underlying incipient degenerative dementia is also possible.  4. Will need close outpatient neurology follow up.     LOS: 2 days   @Electronically  signed: Dr. Caryl Pina 08/14/2011  12:38 PM

## 2011-08-14 NOTE — Evaluation (Signed)
Speech Language Pathology Evaluation Patient Details Name: [name] MRN: [id number] DOB: 02/22/1932 Today's Date: 08/14/2011 Time: 4098-1191 SLP Time Calculation (min): 20 min  Problem List:  Patient Active Problem List  Diagnosis  . VENTRICULAR TACHYCARDIA  . ATRIAL FIBRILLATION  . Chronic systolic heart failure  . AUTOMATIC IMPLANTABLE CARDIAC DEFIBRILLATOR SITU  . CAD (coronary artery disease)  . Chronic anticoagulation  . Bradycardia  . Anemia, iron deficiency  . GERD (gastroesophageal reflux disease)  . Epigastric abdominal pain  . Colon polyp  . Angiodysplasia of colon  . Atrial fibrillation with controlled ventricular response   Past Medical History:  Past Medical History  Diagnosis Date  . Chronic back pain   . Ischemic cardiomyopathy     WITH CHF  . CHF (congestive heart failure)     EF 35-40% s/p most recent ICD generator change-out with [employer] dual-chamber ICD 05/20/11 with explantation of previous abdominally-implanted device  . Hypertension   . Dyslipidemia   . Erythrocytosis   . GERD (gastroesophageal reflux disease)   . Arthritis   . Abnormal thyroid scan     Abnormal thyroid imaging studies from 11/09/2010, status post ultrasound guided fine needle aspiration of the dominant left inferior thyroid nodule on 12/15/2010. Cytology report showed rare follicular epithelial cells and hemosiderin laden macrophages.  . Coronary artery disease     s/p CABG 1983 and PCI/stent 2004.   Marland Kitchen Headache   . Atrial fibrillation     on chronic Coumadin  . VT (ventricular tachycardia)   . ICD (implantable cardiac defibrillator) in place   . Diabetes mellitus     diet controlled  . Atrial fibrillation    Past Surgical History:  Past Surgical History  Procedure Date  . Cardiac defibrillator placement     replaced April, 2013  . Coronary artery bypass graft 1983  . Cholecystectomy   . Esophagogastroduodenoscopy 02/11/2011    Procedure:  ESOPHAGOGASTRODUODENOSCOPY (EGD);  Surgeon: Theda Belfast, MD;  Location: [facility];  Service: Endoscopy;  Laterality: N/A;  . Cardiac catheterization 10/17/06  . Angioplasty     stent placement  . Colonoscopy 07/07/2011    Procedure: COLONOSCOPY;  Surgeon: Beverley Fiedler, MD;  Location: [facility];  Service: Gastroenterology;  Laterality: N/A;   HPI:  78 year old gentleman with extensive cardiac history, including ischemic cardiomyopathy, cabg, atrial fibrillation on coumadin, has been complaining of headaches since 1 month, and reports pain and tingling sensation of the left forearm since this morning. His sensory symptoms have improved on arrival to ED. He underwent a CT of the head showing acute on chronic subdural hematomas. ER physician has consulted neuro surgery and recommended observation by medical admission and to be consulted as needed if his mental status changes.  Patient referred for Cognitive Linguistic Evaluation per stroke protocol.    Assessment / Plan / Recommendation Clinical Impression  Minimal to moderate cognitive deficits in complex problem solving and executive functions.  Deficits judged to be baseline.  Patient to be discharged to home this pm .  Recommend supervision with finances and medication management.     SLP Assessment  Patient does not need any further Speech Lanaguage Pathology Services    Follow Up Recommendations  Other (comment) (supervision with medication and financial management)        SLP Evaluation Prior Functioning  Cognitive/Linguistic Baseline: Baseline deficits Type of Home: Apartment Lives With: Alone Available Help at Discharge: Family;Available PRN/intermittently Education: 12th grade with some community college Vocation: Retired  Cognition  Overall Cognitive Status: Impaired at baseline Arousal/Alertness: Awake/alert Orientation Level: Oriented X4 Attention: Focused Focused Attention: Appears intact Memory: Impaired Memory  Impairment: Storage deficit Awareness: Appears intact Problem Solving: Impaired Problem Solving Impairment: Verbal complex;Functional complex Executive Function: Decision Making Decision Making: Impaired Decision Making Impairment: Verbal complex;Functional complex Safety/Judgment: Impaired    Comprehension  Auditory Comprehension Overall Auditory Comprehension: Appears within functional limits for tasks assessed    Expression Expression Primary Mode of Expression: Verbal Verbal Expression Overall Verbal Expression: Appears within functional limits for tasks assessed   Oral / Motor Oral Motor/Sensory Function Overall Oral Motor/Sensory Function: Impaired at baseline Motor Speech Overall Motor Speech: Impaired at baseline    Moreen Fowler MS, CCC-SLP [phone]     [facility] 08/14/2011, 3:14 PM

## 2011-08-14 NOTE — Progress Notes (Signed)
Physical Therapy Treatment Patient Details Name: [name] MRN: [id number] DOB: [birth year] Today's Date: 08/14/2011 Time: 7829-5621 PT Time Calculation (min): 14 min  PT Assessment / Plan / Recommendation Comments on Treatment Session  Pt is at his baseline functional level. Discussed use of life alert at home for safety. Pts daughter will be staying with him for the first few days    Follow Up Recommendations  No PT follow up;Supervision/Assistance - 24 hour    Barriers to Discharge        Equipment Recommendations  None recommended by OT;None recommended by PT    Recommendations for Other Services    Frequency     Plan All goals met and education completed, patient dischaged from PT services    Precautions / Restrictions Precautions Precautions: Fall Restrictions Weight Bearing Restrictions: No       Mobility  Transfers Transfers: Sit to Stand;Stand to Sit Sit to Stand: 6: Modified independent (Device/Increase time) Stand to Sit: 6: Modified independent (Device/Increase time) Ambulation/Gait Ambulation/Gait Assistance: 5: Supervision Ambulation Distance (Feet): 400 Feet Assistive device: Straight cane Ambulation/Gait Assistance Details: Supervision for safety as pt slightly unsteady although no loss of balance. Gait Pattern: Step-to pattern;Narrow base of support;Decreased hip/knee flexion - right;Decreased stride length Gait velocity: decreased gait speed     PT Goals Acute Rehab PT Goals PT Goal: Sit to Supine/Side - Progress: Met PT Goal: Sit to Stand - Progress: Met PT Goal: Stand to Sit - Progress: Met PT Transfer Goal: Bed to Chair/Chair to Bed - Progress: Met PT Goal: Ambulate - Progress: Met  Visit Information  Last PT Received On: 08/14/11 Assistance Needed: +1    Subjective Data      Cognition  Overall Cognitive Status: Appears within functional limits for tasks assessed/performed Arousal/Alertness: Awake/alert Orientation Level:  Oriented X4 / Intact Behavior During Session: [facility] for tasks performed    Balance     End of Session PT - End of Session Equipment Utilized During Treatment: Gait belt Activity Tolerance: Patient tolerated treatment well Patient left: in chair;with call bell/phone within reach Nurse Communication: Mobility status   GP     Milana Kidney 08/14/2011, 5:27 PM  08/14/2011 Milana Kidney DPT PAGER: [phone] OFFICE: [phone]

## 2011-08-14 NOTE — Progress Notes (Signed)
Physician Discharge Summary  [name] ZOX:[id number] DOB: 21-Feb-1932 DOA: 08/12/2011  PCP: Hoyle Sauer, MD  Admit date: 08/12/2011 Discharge date: 08/14/2011  Recommendations for Outpatient Follow-up:  1. Outpatient Neuro follow-up 2. Follow with PCP in 1 week 3. Did not have specific needs by PT/OT   Discharge Condition: fair  Diet recommendation: Heart Healthy  History of present illness:  78 year old gentleman with extensive cardiac history, including ischemic cardiomyopathy, cabg, atrial fibrillation on coumadin, has been complaining of headaches since 1 month, and reports pain and tingling sensation of the left forearm since this morning. His sensory symptoms have improved on arrival to ED. He underwent a CT of the head showing acute on chronic subdural hematomas. ER physician has consulted neuro surgery and recommended observation by medical admission and to be consulted as needed if his mental status changes. [facility] is [facility] and neurology consult was called.    Hospital Course:    Acute on chronic subdural hematomas/hygromas - bilateral Neurology is following along - currently holding anticoagulants - patient appears to be clinically stable and deficets have completely resolved.  Will Need close out-patient follow-up  Possible right brain TIA Unable to accomplish MRI or MRA due to pacer/defibrillator - echocardiogram and carotid Dopplers have been ordered - may need to consider CT angiogram of the head if there is significant concern that the patient suffered a TIA - I agree with neurology however that his symptoms are likely directly related to his hygromas, and further work-up was not pursued in hospital  Chronic systolic congestive heart failure / ischemic cardiomyopathy Ejection fraction 35-40% - clinically well compensated at present time - followed by [facility] cardiology as an outpatient  Ventricular tachycardia status post AICD  implantation April 2013 Followed by Dr. Sharrell Ku with [facility]-needs outpatient follow-up  Hypertension Currently well-controlled  Dyslipidemia Appears to be well-controlled with home medication regimen  Normocytic anemia Follow the hemoglobin trend  Known coronary artery disease  status post CABG in 1983 and PCI with stent 2004  Chronic atrial fibrillation on Coumadin and amiodarone Rate is currently well-controlled - in the setting we must of course hold anticoagulation - the patient has been informed of the attendant increased risk of thromboembolic stroke without anticoagulation but presently his risk of rebleeding is certainly too high to warrant use of anticoagulation-continue Coreg 3.125, Amiodarone 200 daily  Diabetes mellitus Reportedly diet controlled as outpatient - indeed A1c is 5.6 - CBGs are reasonably controlled at present  Suppressed TSH Free T4 will be assessed - patient has a history of a possible thyroid nodule  Code Status: Full Family Communication: Direct physician patient communication Disposition Plan: Stable for transfer to neurology floor on telemetry   Consultants:  Neurology   Procedures:  Transthoracic echocardiogram 08/13/2011  Bilateral carotid artery Dopplers 08/13/2011 Ct head 08/12/2011   Discharge Exam: Filed Vitals:   08/14/11 0945  BP: 143/65  Pulse: 57  Temp: 98.2 F (36.8 C)  Resp: 18   Filed Vitals:   08/13/11 2200 08/14/11 0200 08/14/11 0600 08/14/11 0945  BP: 123/51 117/61 124/63 143/65  Pulse: 56 55 56 57  Temp: 98.2 F (36.8 C) 98 F (36.7 C) 97.7 F (36.5 C) 98.2 F (36.8 C)  TempSrc:    Oral  Resp: 18 18 18 18   Height:      Weight:      SpO2: 98% 100% 98% 100%   General: No acute respiratory distress  Lungs: Clear to auscultation  bilaterally without wheezes or crackles  Cardiovascular: Regular rate without murmur gallop or rub  Abdomen: Nontender, nondistended, soft, bowel sounds positive, no rebound,  no ascites, no appreciable mass  Extremities: No significant cyanosis, clubbing, or edema bilateral lower extremities  Neurologic: Alert and oriented x4, cranial nerves II through XII intact bilaterally, 5 over 5 strength bilateral upper and lower extremities   Discharge Instructions  Discharge Orders    Future Appointments: Provider: Department: Dept Phone: Center:   08/25/2011 9:00 AM Krista Blue [facility] [phone] None   08/30/2011 9:30 AM Marinus Maw, MD [facility] [phone] [facility]   09/08/2011 9:15 AM [facility] [phone] None   09/08/2011 9:30 AM Peter M [place], MD [facility] [phone] None   09/22/2011 9:00 AM Delcie Roch [facility] [phone] None   10/18/2011 9:30 AM Krista Blue [facility] [phone] None   10/18/2011 10:00 AM Samul Dada, MD [facility] [phone] None     Future Orders Please Complete By Expires   Diet - low sodium heart healthy      Increase activity slowly      Call MD for:  persistant nausea and vomiting      Call MD for:  severe uncontrolled pain      Call MD for:  redness, tenderness, or signs of infection (pain, swelling, redness, odor or green/yellow discharge around incision site)      Call MD for:  difficulty breathing, headache or visual disturbances      Call MD for:  persistant dizziness or light-headedness        Medication List  As of 08/14/2011 11:51 AM   TAKE these medications         amiodarone 200 MG tablet   Commonly known as: PACERONE   Take 1 tablet (200 mg total) by mouth daily.      aspirin 81 MG tablet   Take 2 tablets (162 mg total) by mouth daily.      bisacodyl 10 MG suppository   Commonly known as: DULCOLAX   Place 10 mg rectally as needed. constipation      calcium carbonate 500 MG chewable tablet   Commonly known as: TUMS - dosed in mg elemental calcium   Chew 3 tablets by mouth as needed. For heart burn       carvedilol 25 MG tablet   Commonly known as: COREG   Take 12.5mg  in the morning and 25mg  in the evening by mouth daily      cycloSPORINE 0.05 % ophthalmic emulsion   Commonly known as: RESTASIS   Place 1 drop into both eyes 2 (two) times daily.      dexlansoprazole 60 MG capsule   Commonly known as: DEXILANT   Take 1 capsule (60 mg total) by mouth daily.      DULERA IN   Inhale 2 puffs into the lungs 2 (two) times daily.      fluticasone 50 MCG/ACT nasal spray   Commonly known as: FLONASE   Place 2 sprays into the nose daily.      furosemide 20 MG tablet   Commonly known as: LASIX   Take 40 mg by mouth 2 (two) times daily.      HYDROcodone-acetaminophen 5-500 MG per tablet   Commonly known as: VICODIN   Take 1 tablet by mouth every 6 (six) hours as needed. For pain      lisinopril 10 MG tablet   Commonly known as:  PRINIVIL,ZESTRIL   Take 1 tablet (10 mg total) by mouth 2 (two) times daily.      loratadine 10 MG tablet   Commonly known as: CLARITIN   Take 10 mg by mouth daily.      omega-3 acid ethyl esters 1 G capsule   Commonly known as: LOVAZA   Take 1 g by mouth 2 (two) times daily.      polyethylene glycol packet   Commonly known as: MIRALAX / GLYCOLAX   Take 17 g by mouth 2 (two) times daily as needed. For constipation      potassium chloride 20 MEQ packet   Commonly known as: KLOR-CON   Take 10 mEq by mouth daily.      rosuvastatin 5 MG tablet   Commonly known as: CRESTOR   Take 1 tablet (5 mg total) by mouth once a week.      SYSTANE OP   Place 1 drop into both eyes as needed. For dry eyes         ASK your doctor about these medications         warfarin 5 MG tablet   Commonly known as: COUMADIN   Take 0.5-1 tablets (2.5-5 mg total) by mouth daily. IMPORTANT: Do not restart until Sunday 05/22/11. At that time, you can restart taking your previous dose of 5mg  Tuesday, Saturday, Sunday; 2.5mg  Monday, Wednesday, Thursday, Friday.              The  results of significant diagnostics from this hospitalization (including imaging, microbiology, ancillary and laboratory) are listed below for reference.    Significant Diagnostic Studies: Dg Chest 1 View  08/12/2011  *RADIOLOGY REPORT*  Clinical Data: Chest pressure and numbness since yesterday, history of coronary disease post CABG and defibrillator placement, diabetes, hypertension, CHF, ischemic cardiomyopathy  CHEST - 1 VIEW  Comparison: 05/21/2011  Findings: Left subclavian AICD with leads projecting over right atrium and right ventricle. Epicardial pacing leads and epicardial defibrillator leads identified. Minimal enlargement of cardiac silhouette with pulmonary vascular congestion post CABG. Atherosclerotic calcification aortic arch. Mediastinal contours otherwise normal. No definite pulmonary infiltrate, pleural effusion or pneumothorax. Bones unremarkable.  IMPRESSION: Post cardiac procedures as above with minimal enlargement of cardiac silhouette and pulmonary vascular congestion. No acute abnormalities.  Original Report Authenticated By: Lollie Marrow, M.D.   Ct Head Wo Contrast  08/12/2011  *RADIOLOGY REPORT*  Clinical Data: Headache and dizziness.  CT HEAD WITHOUT CONTRAST  Technique:  Contiguous axial images were obtained from the base of the skull through the vertex without contrast.  Comparison: None.  Findings: The patient has bilateral extra-axial fluid collections which are mixed attenuation, predominately mid and low attenuation. Small foci of increased attenuation are seen over the frontal convexities bilaterally.  The collections measure up to both extra- axial fluid collections measure approximate 0.9 cm in thickness. There is mass effect on the cerebral convexities but no midline shift.  No intraparenchymal hemorrhage, infarct or mass identified. There is no hydrocephalus.  The calvarium is intact.  IMPRESSION: Findings consistent with subacute / chronic bilateral subdural hematomas  with small areas of more acute hemorrhage identified bilaterally.  Critical Value/emergent results were called by telephone at the time of interpretation on 08/12/2011 at 4:10 p.m. to Dr. Rhunette Croft, who verbally acknowledged these results.  Original Report Authenticated By: Bernadene Bell. Maricela Curet, M.D.    Microbiology: Recent Results (from the past 240 hour(s))  MRSA PCR SCREENING     Status: Abnormal   Collection Time  08/12/11  8:37 PM      Component Value Range Status Comment   MRSA by PCR POSITIVE (*) NEGATIVE Final      Labs: Basic Metabolic Panel:  Lab 08/13/11 1610 08/12/11 1603  NA 144 139  K 3.9 4.3  CL 108 101  CO2 28 26  GLUCOSE 96 112*  BUN 16 20  CREATININE 0.96 1.08  CALCIUM 9.1 9.4  MG -- --  PHOS -- --   Liver Function Tests:  Lab 08/13/11 0345 08/12/11 1603  AST 41* 49*  ALT 32 38  ALKPHOS 64 74  BILITOT 0.4 0.4  PROT 6.3 6.6  ALBUMIN 3.4* 3.7   No results found for this basename: LIPASE:5,AMYLASE:5 in the last 168 hours No results found for this basename: AMMONIA:5 in the last 168 hours CBC:  Lab 08/14/11 0500 08/13/11 0345 08/12/11 1603  WBC 7.2 6.6 8.0  NEUTROABS -- -- 4.5  HGB 11.8* 10.9* 11.4*  HCT 37.2* 34.0* 35.4*  MCV 89.0 88.3 87.8  PLT 176 172 172   Cardiac Enzymes:  Lab 08/13/11 1200 08/13/11 0800 08/12/11 2030 08/12/11 1604  CKTOTAL 54 43 46 47  CKMB 1.8 1.8 1.6 1.8  CKMBINDEX -- -- -- --  TROPONINI <0.30 <0.30 <0.30 <0.30   BNP: BNP (last 3 results)  Basename 08/12/11 1604 11/29/10 0500 11/28/10 1338  PROBNP 422.7 1050.0* 1356.0*   CBG:  Lab 08/14/11 1106 08/14/11 0718 08/13/11 2150 08/13/11 1708 08/13/11 1203  GLUCAP 108* 119* 98 107* 104*    Time coordinating discharge:  25 min  Signed:  Rhetta Mura  [facility] 08/14/2011, 11:51 AM

## 2011-08-14 NOTE — Discharge Summary (Signed)
Physician Discharge Summary   [name] YNW:[id number] DOB: February 03, 1932 DOA: 08/12/2011  PCP: Hoyle Sauer, MD  Admit date: 08/12/2011  Discharge date: 08/14/2011  Recommendations for Outpatient Follow-up:  1. Outpatient Neuro follow-up-discontinue Coumadin-needs rpt CT scan in a month showing stability of Hygroma's noted and per out-patient neurologist. 2. Follow with PCP in 1 week 3. Did not have specific needs by PT/OT Discharge Condition: fair  Diet recommendation: Heart Healthy  History of present illness:  78 year old gentleman with extensive cardiac history, including ischemic cardiomyopathy, cabg, atrial fibrillation on coumadin, has been complaining of headaches since 1 month, and reports pain and tingling sensation of the left forearm since this morning. His sensory symptoms have improved on arrival to ED. He underwent a CT of the head showing acute on chronic subdural hematomas. ER physician has consulted neuro surgery and recommended observation by medical admission and to be consulted as needed if his mental status changes. [facility] is being [facility] and neurology consult was called.  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 Will Need close out-patient follow-up-Would not add any meds or Antiplatelet/Coumadinn until follow-up with Neurologist as an out-patient.  Per neurohospitalist, would need neuro f/u in about 2 weeks Possible right brain TIA  Unable to accomplish MRI or MRA due to pacer/defibrillator - echocardiogram and carotid Dopplers have been ordered - may need to consider CT angiogram of the head if there is significant concern that the patient suffered a TIA - I agree with neurology however that his symptoms are likely directly related to his hygromas, and further  work-up was not pursued in hospital  Chronic systolic congestive heart failure / ischemic cardiomyopathy  Ejection fraction 35-40% - clinically well compensated at present time - followed by [facility] cardiology as an outpatient  Ventricular tachycardia status post AICD implantation April 2013  Followed by Dr. Sharrell Ku with [facility]-needs outpatient follow-up  Hypertension  Currently well-controlled  Dyslipidemia  Appears to be well-controlled with home medication regimen  Normocytic anemia  Follow the hemoglobin trend  Known coronary artery disease  status post CABG in 1983 and PCI with stent 2004  Chronic atrial fibrillation on Coumadin and amiodarone  Rate is currently well-controlled - in the setting we must of course hold anticoagulation - the patient has been informed of the attendant increased risk of thromboembolic stroke without anticoagulation but presently his risk of rebleeding is certainly too high to warrant use of anticoagulation-continue Coreg 3.125, Amiodarone 200 daily  Diabetes mellitus  Reportedly diet controlled as outpatient - indeed A1c is 5.6 - CBGs are reasonably controlled at present  Suppressed TSH  Free T4 will be assessed - patient has a history of a possible thyroid nodule  Code Status: Full  Family Communication: Direct physician patient communication  Disposition Plan: Stable for transfer to neurology floor on telemetry  Consultants:  Neurology  Procedures:  Transthoracic echocardiogram 08/13/2011  Bilateral carotid artery Dopplers 08/13/2011  Ct head 08/12/2011  Discharge Exam:  Filed Vitals:    08/14/11 0945   BP:  143/65   Pulse:  57   Temp:  98.2 F (36.8 C)   Resp:  18    Filed Vitals:    08/13/11 2200  08/14/11 0200  08/14/11 0600  08/14/11 0945   BP:  123/51  117/61  124/63  143/65  Pulse:  56  55  56  57   Temp:  98.2 F (36.8 C)  98 F (36.7 C)  97.7 F (36.5 C)  98.2 F (36.8 C)   TempSrc:     Oral   Resp:  18  18  18  18      Height:       Weight:       SpO2:  98%  100%  98%  100%    General: No acute respiratory distress  Lungs: Clear to auscultation bilaterally without wheezes or crackles  Cardiovascular: Regular rate without murmur gallop or rub  Abdomen: Nontender, nondistended, soft, bowel sounds positive, no rebound, no ascites, no appreciable mass  Extremities: No significant cyanosis, clubbing, or edema bilateral lower extremities  Neurologic: Alert and oriented x4, cranial nerves II through XII intact bilaterally, 5 over 5 strength bilateral upper and lower extremities  Discharge Instructions  Discharge Orders    Future Appointments:  Provider:  Department:  Dept Phone:  Center:    08/25/2011 9:00 AM  Krista Blue  [facility]  [phone]  None    08/30/2011 9:30 AM  Marinus Maw, MD  [facility]  [phone]  [facility]    09/08/2011 9:15 AM  [facility]  [facility]  [phone]  None    09/08/2011 9:30 AM  Peter M [place], MD  [facility]  [phone]  None    09/22/2011 9:00 AM  Delcie Roch  [facility]  [phone]  None    10/18/2011 9:30 AM  Krista Blue  [facility]  [phone]  None    10/18/2011 10:00 AM  Samul Dada, MD  [facility]  [phone]  None      Future Orders  Please Complete By  Expires    Diet - low sodium heart healthy      Increase activity slowly      Call MD for: persistant nausea and vomiting      Call MD for: severe uncontrolled pain      Call MD for: redness, tenderness, or signs of infection (pain, swelling, redness, odor or green/yellow discharge around incision site)      Call MD for: difficulty breathing, headache or visual disturbances      Call MD for: persistant dizziness or light-headedness        Medication List  As of 08/14/2011 11:51 AM    TAKE these medications          amiodarone 200 MG tablet      Commonly known as: PACERONE      Take 1 tablet (200 mg total) by mouth daily.       aspirin 81 MG tablet      Take 2 tablets (162 mg total) by mouth daily.      bisacodyl 10 MG suppository      Commonly known as: DULCOLAX      Place 10 mg rectally as needed. constipation      calcium carbonate 500 MG chewable tablet      Commonly known as: TUMS - dosed in mg elemental calcium      Chew 3 tablets by mouth as needed. For heart burn      carvedilol 25 MG tablet      Commonly known as: COREG      Take 12.5mg  in the morning and 25mg  in the evening by mouth daily      cycloSPORINE 0.05 % ophthalmic emulsion  Commonly known as: RESTASIS      Place 1 drop into both eyes 2 (two) times daily.      dexlansoprazole 60 MG capsule      Commonly known as: DEXILANT      Take 1 capsule (60 mg total) by mouth daily.      DULERA IN      Inhale 2 puffs into the lungs 2 (two) times daily.      fluticasone 50 MCG/ACT nasal spray      Commonly known as: FLONASE      Place 2 sprays into the nose daily.      furosemide 20 MG tablet      Commonly known as: LASIX      Take 40 mg by mouth 2 (two) times daily.      HYDROcodone-acetaminophen 5-500 MG per tablet      Commonly known as: VICODIN      Take 1 tablet by mouth every 6 (six) hours as needed. For pain      lisinopril 10 MG tablet      Commonly known as: PRINIVIL,ZESTRIL      Take 1 tablet (10 mg total) by mouth 2 (two) times daily.      loratadine 10 MG tablet      Commonly known as: CLARITIN      Take 10 mg by mouth daily.      omega-3 acid ethyl esters 1 G capsule      Commonly known as: LOVAZA      Take 1 g by mouth 2 (two) times daily.      polyethylene glycol packet      Commonly known as: MIRALAX / GLYCOLAX      Take 17 g by mouth 2 (two) times daily as needed. For constipation      potassium chloride 20 MEQ packet      Commonly known as: KLOR-CON      Take 10 mEq by mouth daily.      rosuvastatin 5 MG tablet      Commonly known as: CRESTOR      Take 1 tablet (5 mg total) by mouth once a week.      SYSTANE OP       Place 1 drop into both eyes as needed. For dry eyes       ASK your doctor about these medications          warfarin 5 MG tablet      Commonly known as: COUMADIN      Take 0.5-1 tablets (2.5-5 mg total) by mouth daily. IMPORTANT: Do not restart until Sunday 05/22/11. At that time, you can restart taking your previous dose of 5mg  Tuesday, Saturday, Sunday; 2.5mg  Monday, Wednesday, Thursday, Friday.          The results of significant diagnostics from this hospitalization (including imaging, microbiology, ancillary and laboratory) are listed below for reference.   Significant Diagnostic Studies:  Dg Chest 1 View  08/12/2011 *RADIOLOGY REPORT* Clinical Data: Chest pressure and numbness since yesterday, history of coronary disease post CABG and defibrillator placement, diabetes, hypertension, CHF, ischemic cardiomyopathy CHEST - 1 VIEW Comparison: 05/21/2011 Findings: Left subclavian AICD with leads projecting over right atrium and right ventricle. Epicardial pacing leads and epicardial defibrillator leads identified. Minimal enlargement of cardiac silhouette with pulmonary vascular congestion post CABG. Atherosclerotic calcification aortic arch. Mediastinal contours otherwise normal. No definite pulmonary infiltrate, pleural effusion or pneumothorax. Bones unremarkable. IMPRESSION: Post cardiac procedures as above with minimal  enlargement of cardiac silhouette and pulmonary vascular congestion. No acute abnormalities. Original Report Authenticated By: Lollie Marrow, M.D.  Ct Head Wo Contrast  08/12/2011 *RADIOLOGY REPORT* Clinical Data: Headache and dizziness. CT HEAD WITHOUT CONTRAST Technique: Contiguous axial images were obtained from the base of the skull through the vertex without contrast. Comparison: None. Findings: The patient has bilateral extra-axial fluid collections which are mixed attenuation, predominately mid and low attenuation. Small foci of increased attenuation are seen over the frontal  convexities bilaterally. The collections measure up to both extra- axial fluid collections measure approximate 0.9 cm in thickness. There is mass effect on the cerebral convexities but no midline shift. No intraparenchymal hemorrhage, infarct or mass identified. There is no hydrocephalus. The calvarium is intact. IMPRESSION: Findings consistent with subacute / chronic bilateral subdural hematomas with small areas of more acute hemorrhage identified bilaterally. Critical Value/emergent results were called by telephone at the time of interpretation on 08/12/2011 at 4:10 p.m. to Dr. Rhunette Croft, who verbally acknowledged these results. Original Report Authenticated By: Bernadene Bell. Maricela Curet, M.D.  Microbiology:  Recent Results (from the past 240 hour(s))   MRSA PCR SCREENING Status: Abnormal    Collection Time    08/12/11 8:37 PM   Component  Value  Range  Status  Comment    MRSA by PCR  POSITIVE (*)  NEGATIVE  Final     Labs:  Basic Metabolic Panel:   Lab  08/13/11 0345  08/12/11 1603   NA  144  139   K  3.9  4.3   CL  108  101   CO2  28  26   GLUCOSE  96  112*   BUN  16  20   CREATININE  0.96  1.08   CALCIUM  9.1  9.4   MG  --  --   PHOS  --  --    Liver Function Tests:   Lab  08/13/11 0345  08/12/11 1603   AST  41*  49*   ALT  32  38   ALKPHOS  64  74   BILITOT  0.4  0.4   PROT  6.3  6.6   ALBUMIN  3.4*  3.7    No results found for this basename: LIPASE:5,AMYLASE:5 in the last 168 hours  No results found for this basename: AMMONIA:5 in the last 168 hours  CBC:   Lab  08/14/11 0500  08/13/11 0345  08/12/11 1603   WBC  7.2  6.6  8.0   NEUTROABS  --  --  4.5   HGB  11.8*  10.9*  11.4*   HCT  37.2*  34.0*  35.4*   MCV  89.0  88.3  87.8   PLT  176  172  172    Cardiac Enzymes:   Lab  08/13/11 1200  08/13/11 0800  08/12/11 2030  08/12/11 1604   CKTOTAL  54  43  46  47   CKMB  1.8  1.8  1.6  1.8   CKMBINDEX  --  --  --  --   TROPONINI  <0.30  <0.30  <0.30  <0.30    BNP:  BNP  (last 3 results)   Basename  08/12/11 1604  11/29/10 0500  11/28/10 1338   PROBNP  422.7  1050.0*  1356.0*    CBG:   Lab  08/14/11 1106  08/14/11 0718  08/13/11 2150  08/13/11 1708  08/13/11 1203   GLUCAP  108*  119*  98  107*  104*    Time coordinating discharge: 25 min  Signed:  Rhetta Mura  [facility]  08/14/2011, 11:51 AM

## 2011-08-14 NOTE — Progress Notes (Signed)
Patient discharged home with activity, medication and follow up instructions.  Patient is to stop taking coumadin per discharge summary.  Patient understands and verbally repeating an understanding of this teaching.  Patient left unit in wheelchair in stable condition.  Osvaldo Angst, RN---------------------

## 2011-08-15 ENCOUNTER — Telehealth: Payer: Self-pay | Admitting: Cardiology

## 2011-08-15 NOTE — Telephone Encounter (Signed)
New msg Pt wants to discuss his meds. Please call

## 2011-08-16 ENCOUNTER — Other Ambulatory Visit: Payer: Self-pay | Admitting: Cardiology

## 2011-08-16 ENCOUNTER — Ambulatory Visit: Payer: Self-pay | Admitting: Cardiology

## 2011-08-16 DIAGNOSIS — I4891 Unspecified atrial fibrillation: Secondary | ICD-10-CM

## 2011-08-16 MED ORDER — CARVEDILOL 25 MG PO TABS: ORAL_TABLET | ORAL | Status: DC

## 2011-08-16 NOTE — Telephone Encounter (Signed)
Fu call °Pt returning your call  °

## 2011-08-16 NOTE — Telephone Encounter (Signed)
Patient called no answer.LMTC. 

## 2011-08-16 NOTE — Telephone Encounter (Signed)
Pt needs a 90 day supply and he is almost out

## 2011-08-16 NOTE — Telephone Encounter (Signed)
Patient called stated he was in hospital over the weekend with a brain bleed and was told to stop coumadin but no one ever told him about aspirin.Spoke to Norma Fredrickson NP she advised no aspirin.

## 2011-08-17 ENCOUNTER — Inpatient Hospital Stay (HOSPITAL_COMMUNITY)
Admission: EM | Admit: 2011-08-17 | Discharge: 2011-08-20 | DRG: 103 | Disposition: A | Discharge status: Home / Self Care | Payer: Medicare Other | Attending: Internal Medicine | Admitting: Internal Medicine

## 2011-08-17 ENCOUNTER — Encounter (HOSPITAL_COMMUNITY): Payer: Self-pay | Admitting: *Deleted

## 2011-08-17 ENCOUNTER — Observation Stay (HOSPITAL_COMMUNITY): Payer: Medicare Other

## 2011-08-17 ENCOUNTER — Emergency Department (HOSPITAL_COMMUNITY): Payer: Medicare Other

## 2011-08-17 DIAGNOSIS — R270 Ataxia, unspecified: Secondary | ICD-10-CM | POA: Diagnosis present

## 2011-08-17 DIAGNOSIS — K219 Gastro-esophageal reflux disease without esophagitis: Secondary | ICD-10-CM

## 2011-08-17 DIAGNOSIS — E860 Dehydration: Secondary | ICD-10-CM | POA: Diagnosis not present

## 2011-08-17 DIAGNOSIS — R001 Bradycardia, unspecified: Secondary | ICD-10-CM

## 2011-08-17 DIAGNOSIS — M1990 Unspecified osteoarthritis, unspecified site: Secondary | ICD-10-CM | POA: Diagnosis present

## 2011-08-17 DIAGNOSIS — I2589 Other forms of chronic ischemic heart disease: Secondary | ICD-10-CM | POA: Diagnosis present

## 2011-08-17 DIAGNOSIS — Z95810 Presence of automatic (implantable) cardiac defibrillator: Secondary | ICD-10-CM

## 2011-08-17 DIAGNOSIS — R42 Dizziness and giddiness: Secondary | ICD-10-CM

## 2011-08-17 DIAGNOSIS — R279 Unspecified lack of coordination: Secondary | ICD-10-CM | POA: Diagnosis not present

## 2011-08-17 DIAGNOSIS — I472 Ventricular tachycardia: Secondary | ICD-10-CM

## 2011-08-17 DIAGNOSIS — I6200 Nontraumatic subdural hemorrhage, unspecified: Secondary | ICD-10-CM | POA: Diagnosis not present

## 2011-08-17 DIAGNOSIS — Z9861 Coronary angioplasty status: Secondary | ICD-10-CM

## 2011-08-17 DIAGNOSIS — I5022 Chronic systolic (congestive) heart failure: Secondary | ICD-10-CM

## 2011-08-17 DIAGNOSIS — R51 Headache: Secondary | ICD-10-CM | POA: Diagnosis not present

## 2011-08-17 DIAGNOSIS — I2510 Atherosclerotic heart disease of native coronary artery without angina pectoris: Secondary | ICD-10-CM | POA: Diagnosis present

## 2011-08-17 DIAGNOSIS — Z7901 Long term (current) use of anticoagulants: Secondary | ICD-10-CM

## 2011-08-17 DIAGNOSIS — I4891 Unspecified atrial fibrillation: Secondary | ICD-10-CM | POA: Diagnosis not present

## 2011-08-17 DIAGNOSIS — D509 Iron deficiency anemia, unspecified: Secondary | ICD-10-CM

## 2011-08-17 DIAGNOSIS — K5520 Angiodysplasia of colon without hemorrhage: Secondary | ICD-10-CM

## 2011-08-17 DIAGNOSIS — R1013 Epigastric pain: Secondary | ICD-10-CM

## 2011-08-17 DIAGNOSIS — R519 Headache, unspecified: Secondary | ICD-10-CM | POA: Diagnosis present

## 2011-08-17 DIAGNOSIS — K635 Polyp of colon: Secondary | ICD-10-CM

## 2011-08-17 DIAGNOSIS — D181 Lymphangioma, any site: Secondary | ICD-10-CM | POA: Diagnosis not present

## 2011-08-17 DIAGNOSIS — I629 Nontraumatic intracranial hemorrhage, unspecified: Secondary | ICD-10-CM | POA: Diagnosis not present

## 2011-08-17 DIAGNOSIS — Z951 Presence of aortocoronary bypass graft: Secondary | ICD-10-CM

## 2011-08-17 DIAGNOSIS — I509 Heart failure, unspecified: Secondary | ICD-10-CM | POA: Diagnosis present

## 2011-08-17 DIAGNOSIS — E785 Hyperlipidemia, unspecified: Secondary | ICD-10-CM | POA: Diagnosis present

## 2011-08-17 DIAGNOSIS — I10 Essential (primary) hypertension: Secondary | ICD-10-CM | POA: Diagnosis present

## 2011-08-17 HISTORY — DX: Unspecified asthma, uncomplicated: J45.909

## 2011-08-17 LAB — URINALYSIS, ROUTINE W REFLEX MICROSCOPIC
Glucose, UA: NEGATIVE mg/dL
Leukocytes, UA: NEGATIVE
Nitrite: NEGATIVE
Specific Gravity, Urine: 1.013 (ref 1.005–1.030)
pH: 5 (ref 5.0–8.0)

## 2011-08-17 LAB — BASIC METABOLIC PANEL
BUN: 41 mg/dL — ABNORMAL HIGH (ref 6–23)
Creatinine, Ser: 1.51 mg/dL — ABNORMAL HIGH (ref 0.50–1.35)
GFR calc Af Amer: 50 mL/min — ABNORMAL LOW (ref >90)
GFR calc non Af Amer: 43 mL/min — ABNORMAL LOW (ref >90)

## 2011-08-17 LAB — CBC
HCT: 38.4 % — ABNORMAL LOW (ref 39.0–52.0)
MCHC: 32.6 g/dL (ref 30.0–36.0)
MCV: 89.5 fL (ref 78.0–100.0)
Platelets: 170 10*3/uL (ref 150–400)
RDW: 16.3 % — ABNORMAL HIGH (ref 11.5–15.5)

## 2011-08-17 LAB — PROTIME-INR: Prothrombin Time: 14.8 seconds (ref 11.6–15.2)

## 2011-08-17 MED ORDER — OMEGA-3-ACID ETHYL ESTERS 1 G PO CAPS
1.0000 g | ORAL_CAPSULE | Freq: Two times a day (BID) | ORAL | Status: DC
  Administered 2011-08-17 – 2011-08-20 (×6): 1 g via ORAL
  Filled 2011-08-17 (×7): qty 1

## 2011-08-17 MED ORDER — CARVEDILOL 12.5 MG PO TABS
12.5000 mg | ORAL_TABLET | Freq: Two times a day (BID) | ORAL | Status: DC
  Filled 2011-08-17 (×3): qty 1

## 2011-08-17 MED ORDER — SODIUM CHLORIDE 0.9 % IV SOLN
INTRAVENOUS | Status: DC
  Administered 2011-08-17 – 2011-08-19 (×2): via INTRAVENOUS

## 2011-08-17 MED ORDER — FLUTICASONE PROPIONATE 50 MCG/ACT NA SUSP
2.0000 | Freq: Every day | NASAL | Status: DC
  Administered 2011-08-17 – 2011-08-20 (×3): 2 via NASAL
  Filled 2011-08-17: qty 16

## 2011-08-17 MED ORDER — CARBOXYMETHYLCELLULOSE SODIUM 1 % OP SOLN: 1.0000 [drp] | Freq: Three times a day (TID) | OPHTHALMIC | Status: DC | PRN

## 2011-08-17 MED ORDER — POLYETHYLENE GLYCOL 3350 17 G PO PACK
17.0000 g | PACK | Freq: Every day | ORAL | Status: DC
  Administered 2011-08-17: 17 g via ORAL
  Filled 2011-08-17 (×3): qty 1

## 2011-08-17 MED ORDER — ACETAMINOPHEN 325 MG PO TABS
650.0000 mg | ORAL_TABLET | ORAL | Status: DC | PRN
  Administered 2011-08-19: 650 mg via ORAL
  Filled 2011-08-17: qty 2

## 2011-08-17 MED ORDER — ONDANSETRON HCL 4 MG/2ML IJ SOLN: 4.0000 mg | Freq: Four times a day (QID) | INTRAMUSCULAR | Status: DC | PRN

## 2011-08-17 MED ORDER — PANTOPRAZOLE SODIUM 40 MG PO TBEC
40.0000 mg | DELAYED_RELEASE_TABLET | Freq: Every day | ORAL | Status: DC
  Administered 2011-08-18 – 2011-08-20 (×3): 40 mg via ORAL
  Filled 2011-08-17 (×3): qty 1

## 2011-08-17 MED ORDER — MORPHINE SULFATE 4 MG/ML IJ SOLN
4.0000 mg | Freq: Once | INTRAMUSCULAR | Status: AC
  Administered 2011-08-17: 4 mg via INTRAVENOUS
  Filled 2011-08-17: qty 1

## 2011-08-17 MED ORDER — ONDANSETRON HCL 4 MG/2ML IJ SOLN
INTRAMUSCULAR | Status: AC
  Administered 2011-08-17: 4 mg
  Filled 2011-08-17: qty 2

## 2011-08-17 MED ORDER — LORATADINE 10 MG PO TABS
10.0000 mg | ORAL_TABLET | Freq: Every day | ORAL | Status: DC
  Administered 2011-08-18 – 2011-08-20 (×3): 10 mg via ORAL
  Filled 2011-08-17 (×4): qty 1

## 2011-08-17 MED ORDER — TRAMADOL HCL 50 MG PO TABS
25.0000 mg | ORAL_TABLET | Freq: Three times a day (TID) | ORAL | Status: DC
  Administered 2011-08-17 – 2011-08-18 (×4): 25 mg via ORAL
  Filled 2011-08-17 (×4): qty 1

## 2011-08-17 MED ORDER — ACETAMINOPHEN 650 MG RE SUPP: 650.0000 mg | RECTAL | Status: DC | PRN

## 2011-08-17 MED ORDER — LORAZEPAM 1 MG PO TABS
1.0000 mg | ORAL_TABLET | Freq: Once | ORAL | Status: AC
  Administered 2011-08-17: 1 mg via ORAL
  Filled 2011-08-17: qty 1

## 2011-08-17 MED ORDER — OXYCODONE HCL 5 MG PO TABS
5.0000 mg | ORAL_TABLET | ORAL | Status: DC | PRN
  Administered 2011-08-19: 5 mg via ORAL
  Filled 2011-08-17: qty 1

## 2011-08-17 MED ORDER — MECLIZINE HCL 12.5 MG PO TABS
12.5000 mg | ORAL_TABLET | Freq: Three times a day (TID) | ORAL | Status: DC
Start: 2011-08-17 — End: 2011-08-20
  Administered 2011-08-17 – 2011-08-20 (×7): 12.5 mg via ORAL
  Filled 2011-08-17 (×10): qty 1

## 2011-08-17 MED ORDER — MECLIZINE HCL 25 MG PO TABS
25.0000 mg | ORAL_TABLET | Freq: Once | ORAL | Status: AC
  Administered 2011-08-17: 25 mg via ORAL
  Filled 2011-08-17: qty 1

## 2011-08-17 MED ORDER — POLYVINYL ALCOHOL 1.4 % OP SOLN
1.0000 [drp] | Freq: Three times a day (TID) | OPHTHALMIC | Status: DC | PRN
  Filled 2011-08-17: qty 15

## 2011-08-17 MED ORDER — CYCLOSPORINE 0.05 % OP EMUL
1.0000 [drp] | Freq: Two times a day (BID) | OPHTHALMIC | Status: DC
  Administered 2011-08-17 – 2011-08-20 (×6): 1 [drp] via OPHTHALMIC
  Filled 2011-08-17 (×7): qty 1

## 2011-08-17 MED ORDER — AMIODARONE HCL 200 MG PO TABS
200.0000 mg | ORAL_TABLET | Freq: Every day | ORAL | Status: DC
  Administered 2011-08-18 – 2011-08-20 (×3): 200 mg via ORAL
  Filled 2011-08-17 (×4): qty 1

## 2011-08-17 MED ORDER — ONDANSETRON HCL 4 MG/2ML IJ SOLN
4.0000 mg | Freq: Once | INTRAMUSCULAR | Status: AC
  Administered 2011-08-17: 4 mg via INTRAVENOUS
  Filled 2011-08-17: qty 2

## 2011-08-17 MED ORDER — ATORVASTATIN CALCIUM 10 MG PO TABS
10.0000 mg | ORAL_TABLET | Freq: Every day | ORAL | Status: DC
  Administered 2011-08-18 – 2011-08-19 (×2): 10 mg via ORAL
  Filled 2011-08-17 (×4): qty 1

## 2011-08-17 MED ORDER — MOMETASONE FURO-FORMOTEROL FUM 100-5 MCG/ACT IN AERO
2.0000 | INHALATION_SPRAY | Freq: Two times a day (BID) | RESPIRATORY_TRACT | Status: DC
  Administered 2011-08-17 – 2011-08-20 (×5): 2 via RESPIRATORY_TRACT

## 2011-08-17 MED ORDER — CALCIUM CARBONATE ANTACID 500 MG PO CHEW
3.0000 | CHEWABLE_TABLET | ORAL | Status: DC | PRN
  Filled 2011-08-17: qty 3

## 2011-08-17 NOTE — ED Notes (Signed)
Pt states sudden onset of severe headache, was taken to CT and pt became very nauseous. No vomiting presently. Pt alert and oriented, NAD at this time, ABC intact. Pt has equal grips, no drift, no facial droop, speech is normal, no slur noted.
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
## 2011-08-17 NOTE — ED Notes (Signed)
Patient with onset of severe headache and dizziness x 1 hour.  He has hx of cerebral hemorrhage.  Patient was in patient and d/c on Sunday.  Patient denies trauma,  Denies nausea

## 2011-08-17 NOTE — ED Notes (Signed)
Pt returned from xray

## 2011-08-18 DIAGNOSIS — I4891 Unspecified atrial fibrillation: Secondary | ICD-10-CM | POA: Diagnosis not present

## 2011-08-18 DIAGNOSIS — E860 Dehydration: Secondary | ICD-10-CM

## 2011-08-18 DIAGNOSIS — R279 Unspecified lack of coordination: Secondary | ICD-10-CM | POA: Diagnosis not present

## 2011-08-18 DIAGNOSIS — I6200 Nontraumatic subdural hemorrhage, unspecified: Secondary | ICD-10-CM | POA: Diagnosis not present

## 2011-08-18 LAB — CBC
MCV: 89.9 fL (ref 78.0–100.0)
Platelets: 147 10*3/uL — ABNORMAL LOW (ref 150–400)
RBC: 3.77 MIL/uL — ABNORMAL LOW (ref 4.22–5.81)
RDW: 16.4 % — ABNORMAL HIGH (ref 11.5–15.5)
WBC: 7.6 10*3/uL (ref 4.0–10.5)

## 2011-08-18 LAB — BASIC METABOLIC PANEL
CO2: 28 mEq/L (ref 19–32)
Calcium: 9 mg/dL (ref 8.4–10.5)
Chloride: 105 mEq/L (ref 96–112)
Creatinine, Ser: 1.35 mg/dL (ref 0.50–1.35)
GFR calc Af Amer: 57 mL/min — ABNORMAL LOW (ref >90)
Sodium: 141 mEq/L (ref 135–145)

## 2011-08-18 LAB — MRSA PCR SCREENING: MRSA by PCR: POSITIVE — AB

## 2011-08-18 LAB — LIPID PANEL
Cholesterol: 115 mg/dL (ref 0–200)
HDL: 33 mg/dL — ABNORMAL LOW (ref >39)
Total CHOL/HDL Ratio: 3.5 RATIO

## 2011-08-18 LAB — HEMOGLOBIN A1C: Hgb A1c MFr Bld: 5.6 % (ref <5.7)

## 2011-08-18 MED ORDER — CHLORHEXIDINE GLUCONATE CLOTH 2 % EX PADS
6.0000 | MEDICATED_PAD | Freq: Every day | CUTANEOUS | Status: DC
  Administered 2011-08-19 – 2011-08-20 (×2): 6 via TOPICAL

## 2011-08-18 MED ORDER — MUPIROCIN 2 % EX OINT
1.0000 "application " | TOPICAL_OINTMENT | Freq: Two times a day (BID) | CUTANEOUS | Status: DC
  Administered 2011-08-18 – 2011-08-20 (×5): 1 via NASAL
  Filled 2011-08-18: qty 22

## 2011-08-18 NOTE — Progress Notes (Signed)
UR Completed Adaliah Hiegel Graves-Bigelow, RN,BSN [phone]  

## 2011-08-18 NOTE — Evaluation (Signed)
Physical Therapy Evaluation Patient Details Name: [name] MRN: [id number] DOB: 02-May-1932 Today's Date: 08/18/2011 Time: 5621-3086 PT Time Calculation (min): 25 min  PT Assessment / Plan / Recommendation Clinical Impression  Patient presented with dizziness. Work up is in progress. Patient will benefit from PT to maximize functional independence with mobility. He has limited caregivers upport and reports fear of using the bath tub to shower. He would benefit from short term aide for assistance with ADL"s at home.     PT Assessment   Recommend home health PT and aide.     Follow Up Recommendations  Home health PT;Supervision - Intermittent    Barriers to Discharge Decreased caregiver support      Equipment Recommendations  None recommended by PT    Recommendations for Other Services  None   Frequency Min 3X/week    Precautions / Restrictions Precautions Precautions: Fall   Pertinent Vitals/Pain VSS/ No pain      Mobility  Bed Mobility Details for Bed Mobility Assistance: Sitting on edge of bed upon entry. Transfers Transfers: Stand Pivot Transfers Sit to Stand: 4: Min guard;From bed;With upper extremity assist Stand to Sit: 5: Supervision;To bed;With upper extremity assist Stand Pivot Transfers: 4: Min guard Details for Transfer Assistance: Patient with increased time to achieve fully upright posture. Slow initiation of stand. Good control of descent.  Ambulation/Gait Ambulation/Gait Assistance: 4: Min guard Ambulation Distance (Feet): 200 Feet Assistive device: Straight cane Ambulation/Gait Assistance Details: Patient states limitations in gait from right knee issues. Gait Pattern: Step-through pattern;Decreased stride length;Trunk flexed;Decreased hip/knee flexion - right;Decreased hip/knee flexion - left;Right flexed knee in stance Gait velocity: Stride length very small - no distance between feet.      PT Diagnosis: Difficulty walking;Generalized weakness   PT Problem List: Decreased activity tolerance;Decreased balance;Decreased mobility;Decreased strength PT Treatment Interventions: DME instruction;Gait training;Therapeutic activities;Therapeutic exercise;Balance training;Patient/family education   PT Goals Acute Rehab PT Goals PT Goal Formulation: With patient Time For Goal Achievement: 08/25/11 Potential to Achieve Goals: Good Pt will go Supine/Side to Sit: with modified independence PT Goal: Supine/Side to Sit - Progress: Goal set today Pt will go Sit to Supine/Side: with modified independence PT Goal: Sit to Supine/Side - Progress: Goal set today Pt will go Sit to Stand: with modified independence;with upper extremity assist PT Goal: Sit to Stand - Progress: Goal set today Pt will go Stand to Sit: with modified independence;with upper extremity assist PT Goal: Stand to Sit - Progress: Goal set today PT Transfer Goal: Bed to Chair/Chair to Bed - Progress: Discontinued (comment) Pt will Ambulate: >150 feet;with modified independence;with cane PT Goal: Ambulate - Progress: Goal set today  Visit Information  Last PT Received On: 08/18/11 Assistance Needed: +1    Subjective Data  Subjective: Patient describes dizziness as more of an off balance feeling.  Patient Stated Goal: Walk with cane safely   Prior Functioning  Home Living Lives With: Alone Available Help at Discharge: Family;Available PRN/intermittently Type of Home: Apartment Home Access: Level entry Home Layout: One level Bathroom Shower/Tub: Health visitor: Handicapped height Bathroom Accessibility: Yes How Accessible: Accessible via walker Home Adaptive Equipment: Straight cane;Walker - rolling;Shower chair with back Prior Function Level of Independence: Independent with assistive device(s) Able to Take Stairs?: Yes Driving: Yes Vocation: Retired Musician: No difficulties Dominant Hand: Right    Cognition  Overall  Cognitive Status: Appears within functional limits for tasks assessed/performed Arousal/Alertness: Awake/alert Orientation Level: Appears intact for tasks assessed Behavior During Session: [facility] for  tasks performed    Extremity/Trunk Assessment Right Lower Extremity Assessment RLE ROM/Strength/Tone: Deficits RLE ROM/Strength/Tone Deficits: Limited right knee strength - 4/5 RLE Sensation: WFL - Light Touch RLE Coordination: WFL - gross/fine motor Left Lower Extremity Assessment LLE ROM/Strength/Tone: WFL for tasks assessed LLE Sensation: WFL - Light Touch LLE Coordination: WFL - gross/fine motor   Balance High Level Balance High Level Balance Activites: Turns;Direction changes High Level Balance Comments: Supervision with cane.   End of Session PT - End of Session Equipment Utilized During Treatment: Gait belt Activity Tolerance: Patient tolerated treatment well Patient left: with call bell/phone within reach (on [facility]) Nurse Communication: Mobility status  GP Functional Assessment Tool Used: Clinical reasoning/judgement Functional Limitation: Mobility: Walking and moving around Mobility: Walking and Moving Around Current Status ([id number]): At least 1 percent but less than 20 percent impaired, limited or restricted Mobility: Walking and Moving Around Goal Status [phone]): At least 1 percent but less than 20 percent impaired, limited or restricted   Edwyna Perfect, PT  Pager [phone]  08/18/2011, 11:02 AM

## 2011-08-18 NOTE — Care Management Note (Unsigned)
    Page 1 of 2   08/19/2011     2:57:46 PM   CARE MANAGEMENT NOTE 08/19/2011  Patient:  [name], [name]   Account Number:  [id number]  Date Initiated:  08/18/2011  Documentation initiated by:  GRAVES-BIGELOW,Beaumont Austad  Subjective/Objective Assessment:   Pt admitted with dizziness and headaches. Pt lives alone and has family that is visiting. PT did work with pt and recommends [facility] PT services.     Action/Plan:   CM will discuss with pt in am other services that will be beneficial.   Anticipated DC Date:  08/20/2011   Anticipated DC Plan:  HOME W HOME HEALTH SERVICES      DC Planning Services  CM consult      [facility] Choice  HOME HEALTH   Choice offered to / List presented to:  C-1 Patient        HH arranged  HH-1 RN  HH-10 DISEASE MANAGEMENT  HH-2 PT  HH-6 SOCIAL WORKER      HH agency  [facility]   Status of service:  In process, will continue to follow Medicare Important Message given?   (If response is "NO", the following Medicare IM given date fields will be blank) Date Medicare IM given:   Date Additional Medicare IM given:    Discharge Disposition:  HOME W HOME HEALTH SERVICES  Per UR Regulation:  Reviewed for med. necessity/level of care/duration of stay  If discussed at Long Length of Stay Meetings, dates discussed:    Comments:  08-19-11 1448 Tomi Bamberger, RN,BSN [phone] CM  did speak to pt and family and he wants [facility] services [facility] for disease/medication management, HHPT for evaluation and treatment and HHSW for resources in [place]. CM provided pt with information on life alert and mobile meals. We aslo spoke about DME he has cane and hospital bed. He states that he sleeps in a recliner and CM suggested that he may need new matress for bed. Son-n-law to look  @ bed  for dme co and will place call to see if it is time of rnew bed vs getting new Mattress. Pt did want to use [facility]- Venia Minks liaison and CM did make referral  for services and made her aware of situation with bed and her to f/u. Md please write orders for above services and will need face to face.  CM also called Anibal Henderson to see if pt is eligible for [facility] services. Thanks

## 2011-08-18 NOTE — Progress Notes (Signed)
[facility] PROGRESS NOTE  [name] ION:[id number] DOB: August 22, 1932 DOA: 08/17/2011 PCP: Hoyle Sauer, MD  Assessment/Plan: Active Problems:  Headache  Vertigo  Ataxia  Dehydration  Active Problems:  1. Headache: Patient had a headache during his last hospitalization, which was attributable to bilateral subdural hematomata. He continued to be symptomatic, although there appears to be interval stability of head CT findings, with only minimal change. In addition, location of headache appears to be occipital. Managing with analgesics/observatiopn. Headache appears improved this AM.  2. Vertigo/Ataxia: The main concern,is the possibility of a cerbellar CVA, not yet apparent on head CT, versus a TIA. Unfortunately, brain MRI is not practicable, given his ICD, and anti-platelet medication is contraindicated. Managing with Meclizine and PT/OT. Full CVA w/u was recently completed. Will not repeat.  3. Dehydration: Patient has a mildly elevated BUN/creatinine ratio, consistent with dehydration. Creatinine is 1.51, against a discharge creatinine of 0.96, only on the 7/145/13. On gentle iv fluids, and hydration status has improved overnight.  Lasix, Coreg and Lisinopril are on hold, as BP is borderline at this time.  4. HTN: History of HTN. See above discussion.  5. Ischemic cardiomyopathy/CHF: Patient has a known history of CAD, s/p CABG 1983, s/p PCI/Stent 2004, ischemic cardiomyopathy/CHF, EF 30%-40%, s/p ICD. Clinically, he does not appear to be in overt CHF. As described above, we have held Lasix, in view of mild dehydration, but will have to monitor fluid status closely..  6. Chronic atrial fibrillation: Rate-controlled on Amiodarone.  7. GERD: Asymptomatic.  8. Dyslipidemia: On Statin, which we shall continue.   Code Status: Full Code. Family Communication:  Disposition Plan: To be determined.   Brief narrative: This is a 78 year old male, with known history of chronic back  pain, CAD s/p CABG 1983, s/p PCI/Stent 2004, ischemic cardiomyopathy/CF, EF 30%-40%, s/p ICD, HTN, s/p cholecystectomy, dyslipidemia, GERD, OA, atrial fibrillation, now off Coumadin anticoagulation, due to bilateral subdural hematomas, diet-controlled DM, admitted 08/12/11-08/14/11, for headaches and dizziness. Head CT on 08/12/11, showed subacute/chronic bilateral subdural hematomas with small areas of more acute hemorrhage. According to patient, since discharge home, headaches have progressed although intermittent, and are mainly occipital. He now has vertigo and gait unsteadiness. Used to ambulate with a cane, but now has had to hold onto objects as well. In AM of 08/17/11, he had a prolonged episode of vertigo, and some nausea, but no vomiting. His son and daughter brought hm to ED.    Consultants:  N/A  Procedures:  Head CT 08/17/11.  Antibiotics:  N/A  HPI/Subjective: No new issues overnight.  Objective: Filed Vitals:   08/18/11 0200 08/18/11 0400 08/18/11 0600 08/18/11 0800  BP: 95/48 115/57 96/47 94/41   Pulse: 60 62 60 61  Temp:  98 F (36.7 C)  98.1 F (36.7 C)  TempSrc:  Oral  Oral  Resp:  18  18  Height:      Weight:      SpO2:  95%  95%   No intake or output data in the 24 hours ending 08/18/11 0901  Exam: eneral: Patient does not appear to be in obvious acute distress. Alert, communicative, fully oriented, talking in complete sentences, not short of breath at rest.  HEENT: Mild clinical pallor, no jaundice, no conjunctival injection or discharge. Hydration status is fair.  NECK: Supple, JVP not seen, no carotid bruits, no palpable lymphadenopathy, no palpable goiter.  CHEST: Clinically clear to auscultation, no wheezes, no crackles.  HEART: Sounds 1 and 2 heard, normal,  irregular, no murmurs.  ABDOMEN: Full, soft, healed lateral abdominal scar (Site of old ICD), non-tender, no palpable organomegaly, no palpable masses, normal bowel sounds.  GENITALIA: Not examined.    LOWER EXTREMITIES: No pitting edema, palpable peripheral pulses.  MUSCULOSKELETAL SYSTEM: Generalized osteoarthritic changes, otherwise, normal.  CENTRAL NERVOUS SYSTEM: Has mild impairment of heel-shin testing bilaterally, otherwise, no focal neurologic deficit on gross examination.   Data Reviewed: Basic Metabolic Panel:  Lab 08/18/11 1478 08/17/11 1154 08/13/11 0345 08/12/11 1603  NA 141 139 144 139  K 4.2 4.2 3.9 4.3  CL 105 104 108 101  CO2 28 23 28 26   GLUCOSE 101* 128* 96 112*  BUN 28* 41* 16 20  CREATININE 1.35 1.51* 0.96 1.08  CALCIUM 9.0 9.2 9.1 9.4  MG -- -- -- --  PHOS -- -- -- --   Liver Function Tests:  Lab 08/13/11 0345 08/12/11 1603  AST 41* 49*  ALT 32 38  ALKPHOS 64 74  BILITOT 0.4 0.4  PROT 6.3 6.6  ALBUMIN 3.4* 3.7   No results found for this basename: LIPASE:5,AMYLASE:5 in the last 168 hours No results found for this basename: AMMONIA:5 in the last 168 hours CBC:  Lab 08/18/11 0630 08/17/11 1154 08/14/11 0500 08/13/11 0345 08/12/11 1603  WBC 7.6 9.2 7.2 6.6 8.0  NEUTROABS -- -- -- -- 4.5  HGB 10.8* 12.5* 11.8* 10.9* 11.4*  HCT 33.9* 38.4* 37.2* 34.0* 35.4*  MCV 89.9 89.5 89.0 88.3 87.8  PLT 147* 170 176 172 172   Cardiac Enzymes:  Lab 08/13/11 1200 08/13/11 0800 08/12/11 2030 08/12/11 1604  CKTOTAL 54 43 46 47  CKMB 1.8 1.8 1.6 1.8  CKMBINDEX -- -- -- --  TROPONINI <0.30 <0.30 <0.30 <0.30   BNP (last 3 results)  Basename 08/12/11 1604 11/29/10 0500 11/28/10 1338  PROBNP 422.7 1050.0* 1356.0*   CBG:  Lab 08/14/11 1106 08/14/11 0718 08/13/11 2150 08/13/11 1708 08/13/11 1203  GLUCAP 108* 119* 98 107* 104*    Recent Results (from the past 240 hour(s))  MRSA PCR SCREENING     Status: Abnormal   Collection Time   08/12/11  8:37 PM      Component Value Range Status Comment   MRSA by PCR POSITIVE (*) NEGATIVE Final   MRSA PCR SCREENING     Status: Abnormal   Collection Time   08/17/11  5:44 PM      Component Value Range Status Comment    MRSA by PCR POSITIVE (*) NEGATIVE Final      Studies: Dg Chest 1 View  08/12/2011  *RADIOLOGY REPORT*  Clinical Data: Chest pressure and numbness since yesterday, history of coronary disease post CABG and defibrillator placement, diabetes, hypertension, CHF, ischemic cardiomyopathy  CHEST - 1 VIEW  Comparison: 05/21/2011  Findings: Left subclavian AICD with leads projecting over right atrium and right ventricle. Epicardial pacing leads and epicardial defibrillator leads identified. Minimal enlargement of cardiac silhouette with pulmonary vascular congestion post CABG. Atherosclerotic calcification aortic arch. Mediastinal contours otherwise normal. No definite pulmonary infiltrate, pleural effusion or pneumothorax. Bones unremarkable.  IMPRESSION: Post cardiac procedures as above with minimal enlargement of cardiac silhouette and pulmonary vascular congestion. No acute abnormalities.  Original Report Authenticated By: Lollie Marrow, M.D.   Dg Chest 2 View  08/17/2011  *RADIOLOGY REPORT*  Clinical Data: Headache and dizziness.  CHEST - 2 VIEW  Comparison: 08/12/2011  Findings: Pacemaker/AICD and multiple epicardial devices are in place.  The pulmonary vascularity is normal.  Lungs are clear.  No effusions.  No acute bony findings.  IMPRESSION: Chronic findings related to the pacemaker and epicardial devices. No active process evident.  Original Report Authenticated By: Thomasenia Sales, M.D.   Ct Head Wo Contrast  08/17/2011  *RADIOLOGY REPORT*  Clinical Data: Severe headache and dizziness.  History of intracranial hemorrhage.  CT HEAD WITHOUT CONTRAST  Technique:  Contiguous axial images were obtained from the base of the skull through the vertex without contrast.  Comparison: 08/12/2011  Findings: There has been no pronounced change since the previous study.  The patient has low density subdural collections bilaterally with a small amount of subacute bleeding in the frontal subdural regions bilaterally.   There is no new hemorrhage on the right. On the left, there may be very minimally more recent subdural blood.  No mass effect or shift.  No intraparenchymal hemorrhage.  No ischemic infarction.  The calvarium is unremarkable.  Sinuses, middle ears and mastoids are clear.  IMPRESSION: Chronic low density subdural collections.  Small amount of subacute subdural bleeding in the frontal regions bilaterally.  This may be very slightly increased on the left compared to the previous study. Any change is minimal and there is no significant mass effect.  Original Report Authenticated By: Thomasenia Sales, M.D.   Ct Head Wo Contrast  08/12/2011  *RADIOLOGY REPORT*  Clinical Data: Headache and dizziness.  CT HEAD WITHOUT CONTRAST  Technique:  Contiguous axial images were obtained from the base of the skull through the vertex without contrast.  Comparison: None.  Findings: The patient has bilateral extra-axial fluid collections which are mixed attenuation, predominately mid and low attenuation. Small foci of increased attenuation are seen over the frontal convexities bilaterally.  The collections measure up to both extra- axial fluid collections measure approximate 0.9 cm in thickness. There is mass effect on the cerebral convexities but no midline shift.  No intraparenchymal hemorrhage, infarct or mass identified. There is no hydrocephalus.  The calvarium is intact.  IMPRESSION: Findings consistent with subacute / chronic bilateral subdural hematomas with small areas of more acute hemorrhage identified bilaterally.  Critical Value/emergent results were called by telephone at the time of interpretation on 08/12/2011 at 4:10 p.m. to Dr. Rhunette Croft, who verbally acknowledged these results.  Original Report Authenticated By: Bernadene Bell. D'ALESSIO, M.D.    Scheduled Meds:   . amiodarone  200 mg Oral Daily  . atorvastatin  10 mg Oral q1800  . Chlorhexidine Gluconate Cloth  6 each Topical Q0600  . cycloSPORINE  1 drop Both Eyes  BID  . fluticasone  2 spray Each Nare Daily  . loratadine  10 mg Oral Daily  . LORazepam  1 mg Oral Once  . meclizine  12.5 mg Oral TID  . meclizine  25 mg Oral Once  . mometasone-formoterol  2 puff Inhalation BID  .  morphine injection  4 mg Intravenous Once  . mupirocin ointment  1 application Nasal BID  . omega-3 acid ethyl esters  1 g Oral BID  . ondansetron      . ondansetron (ZOFRAN) IV  4 mg Intravenous Once  . pantoprazole  40 mg Oral Q1200  . polyethylene glycol  17 g Oral Daily  . traMADol  25 mg Oral TID  . DISCONTD: carvedilol  12.5 mg Oral BID WC   Continuous Infusions:   . sodium chloride 50 mL/hr at 08/17/11 2222    Active Problems:  Headache  Vertigo  Ataxia  Dehydration     Nallely Yost,CHRISTOPHER  [facility] Pager [phone]. If 8PM-8AM, please contact night-coverage at [email or web address], password [facility] 08/18/2011, 9:01 AM  LOS: 1 day

## 2011-08-19 DIAGNOSIS — I4891 Unspecified atrial fibrillation: Secondary | ICD-10-CM | POA: Diagnosis present

## 2011-08-19 DIAGNOSIS — Z95810 Presence of automatic (implantable) cardiac defibrillator: Secondary | ICD-10-CM | POA: Diagnosis not present

## 2011-08-19 DIAGNOSIS — R42 Dizziness and giddiness: Secondary | ICD-10-CM

## 2011-08-19 DIAGNOSIS — E785 Hyperlipidemia, unspecified: Secondary | ICD-10-CM | POA: Diagnosis present

## 2011-08-19 DIAGNOSIS — Z951 Presence of aortocoronary bypass graft: Secondary | ICD-10-CM | POA: Diagnosis not present

## 2011-08-19 DIAGNOSIS — K219 Gastro-esophageal reflux disease without esophagitis: Secondary | ICD-10-CM | POA: Diagnosis present

## 2011-08-19 DIAGNOSIS — I10 Essential (primary) hypertension: Secondary | ICD-10-CM | POA: Diagnosis present

## 2011-08-19 DIAGNOSIS — I2510 Atherosclerotic heart disease of native coronary artery without angina pectoris: Secondary | ICD-10-CM | POA: Diagnosis present

## 2011-08-19 DIAGNOSIS — M1990 Unspecified osteoarthritis, unspecified site: Secondary | ICD-10-CM | POA: Diagnosis present

## 2011-08-19 DIAGNOSIS — R279 Unspecified lack of coordination: Secondary | ICD-10-CM | POA: Diagnosis not present

## 2011-08-19 DIAGNOSIS — Z9861 Coronary angioplasty status: Secondary | ICD-10-CM | POA: Diagnosis not present

## 2011-08-19 DIAGNOSIS — I2589 Other forms of chronic ischemic heart disease: Secondary | ICD-10-CM | POA: Diagnosis present

## 2011-08-19 DIAGNOSIS — I6200 Nontraumatic subdural hemorrhage, unspecified: Secondary | ICD-10-CM | POA: Diagnosis not present

## 2011-08-19 DIAGNOSIS — E860 Dehydration: Secondary | ICD-10-CM | POA: Diagnosis not present

## 2011-08-19 DIAGNOSIS — R51 Headache: Secondary | ICD-10-CM | POA: Diagnosis present

## 2011-08-19 DIAGNOSIS — I509 Heart failure, unspecified: Secondary | ICD-10-CM | POA: Diagnosis present

## 2011-08-19 LAB — BASIC METABOLIC PANEL
BUN: 14 mg/dL (ref 6–23)
CO2: 26 mEq/L (ref 19–32)
Chloride: 105 mEq/L (ref 96–112)
Glucose, Bld: 104 mg/dL — ABNORMAL HIGH (ref 70–99)
Potassium: 3.8 mEq/L (ref 3.5–5.1)

## 2011-08-19 MED ORDER — POLYETHYLENE GLYCOL 3350 17 G PO PACK
17.0000 g | PACK | Freq: Every day | ORAL | Status: DC
  Administered 2011-08-19: 17 g via ORAL
  Filled 2011-08-19 (×2): qty 1

## 2011-08-19 MED ORDER — TRAMADOL HCL 50 MG PO TABS
50.0000 mg | ORAL_TABLET | Freq: Three times a day (TID) | ORAL | Status: DC
  Administered 2011-08-19 – 2011-08-20 (×3): 50 mg via ORAL
  Filled 2011-08-19 (×3): qty 1

## 2011-08-19 NOTE — Progress Notes (Signed)
[facility] PROGRESS NOTE  [name] OZH:[id number] DOB: 10-16-1932 DOA: 08/17/2011 PCP: Hoyle Sauer, MD  Assessment/Plan: Active Problems:  Headache  Vertigo  Ataxia  Dehydration  Active Problems:  1. Headache: Patient had a headache during his last hospitalization, which was attributable to bilateral subdural hematomata. He continued to be symptomatic, although there appears to be interval stability of head CT findings, with only minimal change. In addition, location of headache appears to be occipital. Headache has significantly improved. Will  Adjust analgesics further.  2. Vertigo/Ataxia: The main concern,is the possibility of a cerbellar CVA, not yet apparent on head CT, versus a TIA. Unfortunately, brain MRI is not practicable, given his ICD, and anti-platelet medication is contraindicated. Managing with Meclizine and PT/OT. Full CVA w/u was recently completed, and therefore, not repeated. No recurrence of vertigo since hospitalization. Ambulated with PT on 08/17/11.   3. Dehydration: Patient had a mildly elevated BUN/creatinine ratio, consistent with dehydration, at presentation, with creatinine is 1.51, against a discharge creatinine of 0.96 on the 7/145/13. Managed with gentle iv fluids, and hydration status has normalized.  Lasix, Coreg and Lisinopril were placed on hold, due to low-borderline BP.  4. HTN: History of HTN. See above discussion. BP has now normalized, and fortunately, no orthostasis was recorded. We shall observe for now, and re-instate antihypertensives, if clinically indicated.  5. Ischemic cardiomyopathy/CHF: Patient has a known history of CAD, s/p CABG 1983, s/p PCI/Stent 2004, ischemic cardiomyopathy/CHF, EF 30%-40%, s/p ICD. Clinically, he does not appear to be in overt CHF. As described above, we have held Lasix, in view of mild dehydration. He is euvolemic today, and iv fluids have been discontinued.  6. Chronic atrial fibrillation:  Rate-controlled on Amiodarone.  7. GERD: Asymptomatic.  8. Dyslipidemia: On Statin, which we shall continue. Lipid profile shows TC 115, TG 112, HDL 33, LDL 60.  Code Status: Full Code. Family Communication:  Disposition Plan: Ambulate. Seen by PT, and HHPT/Aide, recommended. Aim possible discharge on 08/20/11.    Brief narrative: This is a 78 year old male, with known history of chronic back pain, CAD s/p CABG 1983, s/p PCI/Stent 2004, ischemic cardiomyopathy/CF, EF 30%-40%, s/p ICD, HTN, s/p cholecystectomy, dyslipidemia, GERD, OA, atrial fibrillation, now off Coumadin anticoagulation, due to bilateral subdural hematomas, diet-controlled DM, admitted 08/12/11-08/14/11, for headaches and dizziness. Head CT on 08/12/11, showed subacute/chronic bilateral subdural hematomas with small areas of more acute hemorrhage. According to patient, since discharge home, headaches have progressed although intermittent, and are mainly occipital. He now has vertigo and gait unsteadiness. Used to ambulate with a cane, but now has had to hold onto objects as well. In AM of 08/17/11, he had a prolonged episode of vertigo, and some nausea, but no vomiting. His son and daughter brought hm to ED.    Consultants:  N/A  Procedures:  Head CT 08/17/11.  Antibiotics:  N/A  HPI/Subjective: No new issues overnight.  Objective: Filed Vitals:   08/19/11 0400 08/19/11 0402 08/19/11 0603 08/19/11 0800  BP: 115/37 133/51 158/76 106/60  Pulse: 55 55 68 77  Temp: 97.6 F (36.4 C)   97.7 F (36.5 C)  TempSrc: Oral   Oral  Resp:   18 18  Height:      Weight:      SpO2: 95% 95%  96%    Intake/Output Summary (Last 24 hours) at 08/19/11 0916 Last data filed at 08/19/11 0600  Gross per 24 hour  Intake      0 ml  Output  1435 ml  Net  -1435 ml    Exam: eneral: Patient does not appear to be in obvious acute distress. Alert, communicative, fully oriented, talking in complete sentences, not short of breath at rest.   HEENT: Mild clinical pallor, no jaundice, no conjunctival injection or discharge. Hydration status is satisfactory.  NECK: Supple, JVP not seen, no carotid bruits, no palpable lymphadenopathy, no palpable goiter.  CHEST: Clinically clear to auscultation, no wheezes, no crackles.  HEART: Sounds 1 and 2 heard, normal, irregular, no murmurs.  ABDOMEN: Full, soft, healed lateral abdominal scar (Site of old ICD), non-tender, no palpable organomegaly, no palpable masses, normal bowel sounds.  GENITALIA: Not examined.  LOWER EXTREMITIES: No pitting edema, palpable peripheral pulses.  MUSCULOSKELETAL SYSTEM: Generalized osteoarthritic changes, otherwise, normal.  CENTRAL NERVOUS SYSTEM: Has mild impairment of heel-shin testing bilaterally, otherwise, no focal neurologic deficit on gross examination.   Data Reviewed: Basic Metabolic Panel:  Lab 08/19/11 1610 08/18/11 0630 08/17/11 1154 08/13/11 0345 08/12/11 1603  NA 140 141 139 144 139  K 3.8 4.2 4.2 3.9 4.3  CL 105 105 104 108 101  CO2 26 28 23 28 26   GLUCOSE 104* 101* 128* 96 112*  BUN 14 28* 41* 16 20  CREATININE 0.95 1.35 1.51* 0.96 1.08  CALCIUM 8.7 9.0 9.2 9.1 9.4  MG -- -- -- -- --  PHOS -- -- -- -- --   Liver Function Tests:  Lab 08/13/11 0345 08/12/11 1603  AST 41* 49*  ALT 32 38  ALKPHOS 64 74  BILITOT 0.4 0.4  PROT 6.3 6.6  ALBUMIN 3.4* 3.7   No results found for this basename: LIPASE:5,AMYLASE:5 in the last 168 hours No results found for this basename: AMMONIA:5 in the last 168 hours CBC:  Lab 08/18/11 0630 08/17/11 1154 08/14/11 0500 08/13/11 0345 08/12/11 1603  WBC 7.6 9.2 7.2 6.6 8.0  NEUTROABS -- -- -- -- 4.5  HGB 10.8* 12.5* 11.8* 10.9* 11.4*  HCT 33.9* 38.4* 37.2* 34.0* 35.4*  MCV 89.9 89.5 89.0 88.3 87.8  PLT 147* 170 176 172 172   Cardiac Enzymes:  Lab 08/13/11 1200 08/13/11 0800 08/12/11 2030 08/12/11 1604  CKTOTAL 54 43 46 47  CKMB 1.8 1.8 1.6 1.8  CKMBINDEX -- -- -- --  TROPONINI <0.30 <0.30  <0.30 <0.30   BNP (last 3 results)  Basename 08/12/11 1604 11/29/10 0500 11/28/10 1338  PROBNP 422.7 1050.0* 1356.0*   CBG:  Lab 08/14/11 1106 08/14/11 0718 08/13/11 2150 08/13/11 1708 08/13/11 1203  GLUCAP 108* 119* 98 107* 104*    Recent Results (from the past 240 hour(s))  MRSA PCR SCREENING     Status: Abnormal   Collection Time   08/12/11  8:37 PM      Component Value Range Status Comment   MRSA by PCR POSITIVE (*) NEGATIVE Final   MRSA PCR SCREENING     Status: Abnormal   Collection Time   08/17/11  5:44 PM      Component Value Range Status Comment   MRSA by PCR POSITIVE (*) NEGATIVE Final      Studies: Dg Chest 1 View  08/12/2011  *RADIOLOGY REPORT*  Clinical Data: Chest pressure and numbness since yesterday, history of coronary disease post CABG and defibrillator placement, diabetes, hypertension, CHF, ischemic cardiomyopathy  CHEST - 1 VIEW  Comparison: 05/21/2011  Findings: Left subclavian AICD with leads projecting over right atrium and right ventricle. Epicardial pacing leads and epicardial defibrillator leads identified. Minimal enlargement of cardiac silhouette with pulmonary  vascular congestion post CABG. Atherosclerotic calcification aortic arch. Mediastinal contours otherwise normal. No definite pulmonary infiltrate, pleural effusion or pneumothorax. Bones unremarkable.  IMPRESSION: Post cardiac procedures as above with minimal enlargement of cardiac silhouette and pulmonary vascular congestion. No acute abnormalities.  Original Report Authenticated By: Lollie Marrow, M.D.   Dg Chest 2 View  08/17/2011  *RADIOLOGY REPORT*  Clinical Data: Headache and dizziness.  CHEST - 2 VIEW  Comparison: 08/12/2011  Findings: Pacemaker/AICD and multiple epicardial devices are in place.  The pulmonary vascularity is normal.  Lungs are clear.  No effusions.  No acute bony findings.  IMPRESSION: Chronic findings related to the pacemaker and epicardial devices. No active process evident.   Original Report Authenticated By: Thomasenia Sales, M.D.   Ct Head Wo Contrast  08/17/2011  *RADIOLOGY REPORT*  Clinical Data: Severe headache and dizziness.  History of intracranial hemorrhage.  CT HEAD WITHOUT CONTRAST  Technique:  Contiguous axial images were obtained from the base of the skull through the vertex without contrast.  Comparison: 08/12/2011  Findings: There has been no pronounced change since the previous study.  The patient has low density subdural collections bilaterally with a small amount of subacute bleeding in the frontal subdural regions bilaterally.  There is no new hemorrhage on the right. On the left, there may be very minimally more recent subdural blood.  No mass effect or shift.  No intraparenchymal hemorrhage.  No ischemic infarction.  The calvarium is unremarkable.  Sinuses, middle ears and mastoids are clear.  IMPRESSION: Chronic low density subdural collections.  Small amount of subacute subdural bleeding in the frontal regions bilaterally.  This may be very slightly increased on the left compared to the previous study. Any change is minimal and there is no significant mass effect.  Original Report Authenticated By: Thomasenia Sales, M.D.   Ct Head Wo Contrast  08/12/2011  *RADIOLOGY REPORT*  Clinical Data: Headache and dizziness.  CT HEAD WITHOUT CONTRAST  Technique:  Contiguous axial images were obtained from the base of the skull through the vertex without contrast.  Comparison: None.  Findings: The patient has bilateral extra-axial fluid collections which are mixed attenuation, predominately mid and low attenuation. Small foci of increased attenuation are seen over the frontal convexities bilaterally.  The collections measure up to both extra- axial fluid collections measure approximate 0.9 cm in thickness. There is mass effect on the cerebral convexities but no midline shift.  No intraparenchymal hemorrhage, infarct or mass identified. There is no hydrocephalus.  The  calvarium is intact.  IMPRESSION: Findings consistent with subacute / chronic bilateral subdural hematomas with small areas of more acute hemorrhage identified bilaterally.  Critical Value/emergent results were called by telephone at the time of interpretation on 08/12/2011 at 4:10 p.m. to Dr. Rhunette Croft, who verbally acknowledged these results.  Original Report Authenticated By: Bernadene Bell. D'ALESSIO, M.D.    Scheduled Meds:    . amiodarone  200 mg Oral Daily  . atorvastatin  10 mg Oral q1800  . Chlorhexidine Gluconate Cloth  6 each Topical Q0600  . cycloSPORINE  1 drop Both Eyes BID  . fluticasone  2 spray Each Nare Daily  . loratadine  10 mg Oral Daily  . meclizine  12.5 mg Oral TID  . mometasone-formoterol  2 puff Inhalation BID  . mupirocin ointment  1 application Nasal BID  . omega-3 acid ethyl esters  1 g Oral BID  . pantoprazole  40 mg Oral Q1200  . polyethylene glycol  17  g Oral Daily  . traMADol  25 mg Oral TID   Continuous Infusions:    . DISCONTD: sodium chloride 75 mL/hr at 08/19/11 0313    Active Problems:  Headache  Vertigo  Ataxia  Dehydration     Aydrien Froman,CHRISTOPHER  [facility] Pager [phone]. If 8PM-8AM, please contact night-coverage at [email or web address], password [facility] 08/19/2011, 9:16 AM  LOS: 2 days

## 2011-08-19 NOTE — Progress Notes (Signed)
Around 0600 pt started complaining of a h/a rating it with an 8 out of 10. Pt stated it felt similar to the h/a he had when he had his stroke in the past. VS were done and his bp was elevated at 158/76 with a HR of 68. Pt was given 5 mg Of oxy IR PO. Neuro check was done and was within normal limits for the pt's baseline. Pt stated Meds was helping the h/a improve.  MD on call made aware. Will continue to monitor pt and re-assess. Sanda Linger

## 2011-08-19 NOTE — Progress Notes (Signed)
Occupational Therapy Evaluation Patient Details Name: [name] MRN: [id number] DOB: [birth year] Today's Date: 08/19/2011 Time: 4696-2952 OT Time Calculation (min): 24 min  OT Assessment / Plan / Recommendation Clinical Impression  78 y.o. patient presented with dizziness. Work up is in progress. Patient will benefit from OT to maximize functional independence and safety with ADLS prior to d/c. He has limited caregiver support. OT to follow acutely.    OT Assessment  Patient needs continued OT Services    Follow Up Recommendations  Home health OT;Supervision - Intermittent    Barriers to Discharge      Equipment Recommendations  None recommended by OT    Recommendations for Other Services    Frequency  Min 2X/week    Precautions / Restrictions Precautions Precautions: Fall Restrictions Weight Bearing Restrictions: No   Pertinent Vitals/Pain Vitals Monitored and Stable. Pt. reported headache and receiving meds prior to session, but did not quantify pain.     ADL  Grooming: Performed;Supervision/safety;Wash/dry face Where Assessed - Grooming: Supported standing Lower Body Dressing: Performed;Supervision/safety Where Assessed - Lower Body Dressing: Unsupported sitting Toilet Transfer: Buyer, retail Method: Sit to Barista: Regular height toilet Equipment Used: Cane;Gait belt ADL Comments: Pt. able to don socks with Supervison and extra time while sitting EOB. He reported stiffness in Rt. leg/knee due to previous surgery.Pt. ambulated to sink with Min G assist due to decreased balance. Pt. washed face while standing supported with Supervision     OT Diagnosis: Generalized weakness  OT Problem List: Decreased activity tolerance;Impaired balance (sitting and/or standing);Decreased strength OT Treatment Interventions: Self-care/ADL training;DME and/or AE instruction;Balance training;Patient/family education   OT  Goals Acute Rehab OT Goals OT Goal Formulation: With patient Time For Goal Achievement: 09/02/11 Potential to Achieve Goals: Good ADL Goals Pt Will Perform Lower Body Bathing: Sit to stand from chair;with modified independence ADL Goal: Lower Body Bathing - Progress: Goal set today Pt Will Perform Lower Body Dressing: with modified independence;Sit to stand from bed;Sit to stand from chair ADL Goal: Lower Body Dressing - Progress: Goal set today Pt Will Transfer to Toilet: Ambulation;Grab bars;with modified independence;Comfort height toilet ADL Goal: Toilet Transfer - Progress: Goal set today Pt Will Perform Tub/Shower Transfer: Shower transfer;Ambulation;Shower seat with back;with modified independence ADL Goal: Tub/Shower Transfer - Progress: Goal set today  Visit Information  Last OT Received On: 08/19/11 Assistance Needed: +1    Subjective Data  Subjective: Pt. pleasant in session. Patient Stated Goal: go home   Prior Functioning  Vision/Perception  Home Living Lives With: Alone Available Help at Discharge: Family;Available PRN/intermittently Type of Home: Apartment Home Access: Level entry Home Layout: One level Bathroom Shower/Tub: Walk-in Contractor: Handicapped height Bathroom Accessibility: Yes How Accessible: Accessible via walker Home Adaptive Equipment: Straight cane;Walker - rolling;Shower chair with back;Grab bars around toilet;Grab bars in shower Prior Function Level of Independence: Independent with assistive device(s) Able to Take Stairs?: Yes Driving: Yes Vocation: Retired Musician: No difficulties Dominant Hand: Right      Cognition  Overall Cognitive Status: Appears within functional limits for tasks assessed/performed Arousal/Alertness: Awake/alert Orientation Level: Appears intact for tasks assessed Behavior During Session: [facility] for tasks performed    Extremity/Trunk Assessment Right Upper Extremity  Assessment RUE ROM/Strength/Tone: Within functional levels Left Upper Extremity Assessment LUE ROM/Strength/Tone: Within functional levels   Mobility Bed Mobility Bed Mobility: Supine to Sit;Sitting - Scoot to Edge of Bed Supine to Sit: 5: Supervision Sitting - Scoot to Edge of Bed: 5: Supervision  Transfers Transfers: Sit to Stand;Stand to Sit Sit to Stand: 5: Supervision;From bed Stand to Sit: 5: Supervision;With upper extremity assist;To bed           End of Session OT - End of Session Activity Tolerance: Patient tolerated treatment well Patient left: in chair;with call bell/phone within reach  GO     Jenell Milliner 08/19/2011, 12:02 PM  I agree with the following treatment note after reviewing documentation.   Harrel Carina [place]   OTR/L Pager: [phone] Office: [phone] .

## 2011-08-19 NOTE — Progress Notes (Signed)
Thank you to Tomi Bamberger RN CM for this referral.  Chart review complete.  Reviewed services at bedside with patient, his daughter, and son in law.  Patient agree to receive services and consents were obtained.  Patient will a transition of care call upon discharge.  RN Care Coordination services will initially focus on medication management due to his discontinuation of coumadin.  Patient will receive disease process management support and education because he is not aware of the early reportable signs of A-Fib exacerbation.  Patient will receive a home safety evaluation to assess his risk of falls and injury.  Patient will receive a LCSW referral for transportation needs and the coordination of community resources. Spoke with daughter about possibly managing the delivery of his grocery and medicationFor any additional questions or new referrals please contact Anibal Henderson BSN RN [facility] Liaison at [phone].  online to reduce his need to drive until he is more medically stable.

## 2011-08-20 DIAGNOSIS — I6200 Nontraumatic subdural hemorrhage, unspecified: Secondary | ICD-10-CM | POA: Diagnosis not present

## 2011-08-20 DIAGNOSIS — R279 Unspecified lack of coordination: Secondary | ICD-10-CM | POA: Diagnosis not present

## 2011-08-20 DIAGNOSIS — E860 Dehydration: Secondary | ICD-10-CM | POA: Diagnosis not present

## 2011-08-20 DIAGNOSIS — R42 Dizziness and giddiness: Secondary | ICD-10-CM | POA: Diagnosis not present

## 2011-08-20 MED ORDER — LISINOPRIL 10 MG PO TABS: 10.0000 mg | ORAL_TABLET | Freq: Every day | ORAL | Status: DC

## 2011-08-20 MED ORDER — CARVEDILOL 12.5 MG PO TABS: 12.5000 mg | ORAL_TABLET | Freq: Two times a day (BID) | ORAL | Status: DC

## 2011-08-20 MED ORDER — LISINOPRIL 10 MG PO TABS: 10.0000 mg | ORAL_TABLET | Freq: Two times a day (BID) | ORAL | Status: DC

## 2011-08-20 MED ORDER — LISINOPRIL 10 MG PO TABS
10.0000 mg | ORAL_TABLET | Freq: Every day | ORAL | Status: DC
  Administered 2011-08-20: 10 mg via ORAL
  Filled 2011-08-20: qty 1

## 2011-08-20 MED ORDER — MECLIZINE HCL 12.5 MG PO TABS: 12.5000 mg | ORAL_TABLET | Freq: Three times a day (TID) | ORAL | Status: DC

## 2011-08-20 MED ORDER — TRAMADOL HCL 50 MG PO TABS: 50.0000 mg | ORAL_TABLET | Freq: Three times a day (TID) | ORAL | Status: DC | PRN

## 2011-08-20 NOTE — Discharge Summary (Signed)
Physician Discharge Summary  [name] ZOX:[id number] DOB: 1932/08/06 DOA: 08/17/2011  PCP: Hoyle Sauer, MD  Admit date: 08/17/2011 Discharge date: 08/20/2011  Recommendations for Outpatient Follow-up:  1. Follow up with primary MD. 2. HHPT/RN/Social worker.   Discharge Diagnoses:  Active Problems:  Headache  Vertigo  Ataxia  Dehydration   Discharge Condition: Satisfactory.  Diet recommendation: Carbohydrate Modified.  History of present illness:  This is a 78 year old male, with known history of chronic back pain, CAD s/p CABG 1983, s/p PCI/Stent 2004, ischemic cardiomyopathy/CF, EF 30%-40%, s/p ICD, HTN, s/p cholecystectomy, dyslipidemia, GERD, OA, atrial fibrillation, now off Coumadin anticoagulation, due to bilateral subdural hematomas, diet-controlled DM, admitted 08/12/11-08/14/11, for headaches and dizziness. Head CT on 08/12/11, showed subacute/chronic bilateral subdural hematomas with small areas of more acute hemorrhage. According to patient, since discharge home, headaches have progressed although intermittent, and are mainly occipital, and now complicated by vertigo and gait unsteadiness. He used to ambulate with a cane, but now has had to hold onto objects as well. In AM of 08/17/11, he had a prolonged episode of vertigo, and some nausea, but no vomiting. His son and daughter brought him to ED.    Hospital Course:  1. Headache: Patient had a headache during his last hospitalization, which was attributable to bilateral subdural hematomata. He continued to be symptomatic, although there appears to be interval stability of head CT findings, with only minimal change. In addition, location of headache appears to be occipital. Headache has significantly during his hospitalization, with utilization of analgesics.  2. Vertigo/Ataxia: The main concern, was  the possibility of a cerbellar CVA, not yet apparent on head CT, versus a TIA. Unfortunately, brain MRI was not  practicable, given his ICD, and anti-platelet medication is contraindicated. He was managed with Meclizine and PT/OT, with satisfactory response. As full CVA w/u was recently completed, this was not repeated. Fortunately, he has no recurrence of vertigo during hospitalization.  3. Dehydration: Patient had a mildly elevated BUN/creatinine ratio, consistent with dehydration, at presentation, with creatinine 1.51, against a discharge creatinine of 0.96 on the 7/145/13. Managed with gentle iv fluids, and hydration status normalized. Lasix, Coreg and Lisinopril were initially placed on hold, due to low-borderline BP.  4. HTN: History of HTN. See above discussion. BP has now normalized, and fortunately, no orthostasis was recorded. Due to a recorded BP of 146/70 on 08/20/11, Coreg and Lisinopril were recommenced, although Coreg dose has been changed to 12.5 mg b.i.d. and Lisinopril, to 10 mg daily. 5. Ischemic cardiomyopathy/CHF: Patient has a known history of CAD, s/p CABG 1983, s/p PCI/Stent 2004, ischemic cardiomyopathy/CHF, EF 30%-40%, s/p ICD. Clinically, he showed no features of overt overt CHF during this hospitalization. As described above, we have held Lasix, in view of mild dehydration. IV fluids were discontinued on 08/19/11. He is euvolemic today. 6. Chronic atrial fibrillation: Rate-controlled on Amiodarone.  7. GERD: Asymptomatic.  8. Dyslipidemia: On Statin, which we continued. Lipid profile shows TC 115, TG 112, HDL 33, LDL 60.   Procedures:  Head CT/CXR  Consultations:  None.  Discharge Exam: Filed Vitals:   08/20/11 0500  BP:   Pulse:   Temp: 97.7 F (36.5 C)  Resp:    Filed Vitals:   08/19/11 2101 08/20/11 0000 08/20/11 0400 08/20/11 0500  BP:  125/58 146/70   Pulse:  60 56   Temp:  97.9 F (36.6 C) 96.7 F (35.9 C) 97.7 F (36.5 C)  TempSrc:  Oral Oral Oral  Resp:  Height:      Weight:      SpO2: 98% 95% 95%    General: Patient does not appear to be in obvious  acute distress. Alert, communicative, fully oriented, talking in complete sentences, not short of breath at rest.  HEENT: Mild clinical pallor, no jaundice, no conjunctival injection or discharge. Hydration status is satisfactory.  NECK: Supple, JVP not seen, no carotid bruits, no palpable lymphadenopathy, no palpable goiter.  CHEST: Clinically clear to auscultation, no wheezes, no crackles.  HEART: Sounds 1 and 2 heard, normal, irregular, no murmurs.  ABDOMEN: Full, soft, healed lateral abdominal scar (Site of old ICD), non-tender, no palpable organomegaly, no palpable masses, normal bowel sounds.  GENITALIA: Not examined.  LOWER EXTREMITIES: No pitting edema, palpable peripheral pulses.  MUSCULOSKELETAL SYSTEM: Generalized osteoarthritic changes, otherwise, normal.  CENTRAL NERVOUS SYSTEM: Has mild impairment of heel-shin testing bilaterally, otherwise, no focal neurologic deficit on gross examination.  Discharge Instructions  Discharge Orders    Future Appointments: Provider: Department: Dept Phone: Center:   08/25/2011 9:00 AM Krista Blue [facility] [phone] None   08/30/2011 9:30 AM Marinus Maw, MD [facility] [phone] [facility]   09/08/2011 9:30 AM Peter M [place], MD [facility] [phone] None   09/22/2011 9:00 AM Delcie Roch [facility] [phone] None   10/18/2011 9:30 AM Krista Blue [facility] [phone] None   10/18/2011 10:00 AM Samul Dada, MD [facility] [phone] None     Future Orders Please Complete By Expires   Diet - low sodium heart healthy      Increase activity slowly         Follow-up Information    Follow up with [facility]. (Home Health Physical Therapy, RN and Social Worker)    Contact information:   [phone]          The results of significant diagnostics from this hospitalization (including imaging, microbiology, ancillary and laboratory) are listed below for reference.     Significant Diagnostic Studies: Dg Chest 1 View  08/12/2011  *RADIOLOGY REPORT*  Clinical Data: Chest pressure and numbness since yesterday, history of coronary disease post CABG and defibrillator placement, diabetes, hypertension, CHF, ischemic cardiomyopathy  CHEST - 1 VIEW  Comparison: 05/21/2011  Findings: Left subclavian AICD with leads projecting over right atrium and right ventricle. Epicardial pacing leads and epicardial defibrillator leads identified. Minimal enlargement of cardiac silhouette with pulmonary vascular congestion post CABG. Atherosclerotic calcification aortic arch. Mediastinal contours otherwise normal. No definite pulmonary infiltrate, pleural effusion or pneumothorax. Bones unremarkable.  IMPRESSION: Post cardiac procedures as above with minimal enlargement of cardiac silhouette and pulmonary vascular congestion. No acute abnormalities.  Original Report Authenticated By: Lollie Marrow, M.D.   Dg Chest 2 View  08/17/2011  *RADIOLOGY REPORT*  Clinical Data: Headache and dizziness.  CHEST - 2 VIEW  Comparison: 08/12/2011  Findings: Pacemaker/AICD and multiple epicardial devices are in place.  The pulmonary vascularity is normal.  Lungs are clear.  No effusions.  No acute bony findings.  IMPRESSION: Chronic findings related to the pacemaker and epicardial devices. No active process evident.  Original Report Authenticated By: Thomasenia Sales, M.D.   Ct Head Wo Contrast  08/17/2011  *RADIOLOGY REPORT*  Clinical Data: Severe headache and dizziness.  History of intracranial hemorrhage.  CT HEAD WITHOUT CONTRAST  Technique:  Contiguous axial images were obtained from the base of the skull through the vertex without contrast.  Comparison: 08/12/2011  Findings: There has been no  pronounced change since the previous study.  The patient has low density subdural collections bilaterally with a small amount of subacute bleeding in the frontal subdural regions bilaterally.  There is no new  hemorrhage on the right. On the left, there may be very minimally more recent subdural blood.  No mass effect or shift.  No intraparenchymal hemorrhage.  No ischemic infarction.  The calvarium is unremarkable.  Sinuses, middle ears and mastoids are clear.  IMPRESSION: Chronic low density subdural collections.  Small amount of subacute subdural bleeding in the frontal regions bilaterally.  This may be very slightly increased on the left compared to the previous study. Any change is minimal and there is no significant mass effect.  Original Report Authenticated By: Thomasenia Sales, M.D.   Ct Head Wo Contrast  08/12/2011  *RADIOLOGY REPORT*  Clinical Data: Headache and dizziness.  CT HEAD WITHOUT CONTRAST  Technique:  Contiguous axial images were obtained from the base of the skull through the vertex without contrast.  Comparison: None.  Findings: The patient has bilateral extra-axial fluid collections which are mixed attenuation, predominately mid and low attenuation. Small foci of increased attenuation are seen over the frontal convexities bilaterally.  The collections measure up to both extra- axial fluid collections measure approximate 0.9 cm in thickness. There is mass effect on the cerebral convexities but no midline shift.  No intraparenchymal hemorrhage, infarct or mass identified. There is no hydrocephalus.  The calvarium is intact.  IMPRESSION: Findings consistent with subacute / chronic bilateral subdural hematomas with small areas of more acute hemorrhage identified bilaterally.  Critical Value/emergent results were called by telephone at the time of interpretation on 08/12/2011 at 4:10 p.m. to Dr. Rhunette Croft, who verbally acknowledged these results.  Original Report Authenticated By: Bernadene Bell. Maricela Curet, M.D.    Microbiology: Recent Results (from the past 240 hour(s))  MRSA PCR SCREENING     Status: Abnormal   Collection Time   08/12/11  8:37 PM      Component Value Range Status Comment   MRSA by  PCR POSITIVE (*) NEGATIVE Final   MRSA PCR SCREENING     Status: Abnormal   Collection Time   08/17/11  5:44 PM      Component Value Range Status Comment   MRSA by PCR POSITIVE (*) NEGATIVE Final      Labs: Basic Metabolic Panel:  Lab 08/19/11 9604 08/18/11 0630 08/17/11 1154  NA 140 141 139  K 3.8 4.2 4.2  CL 105 105 104  CO2 26 28 23   GLUCOSE 104* 101* 128*  BUN 14 28* 41*  CREATININE 0.95 1.35 1.51*  CALCIUM 8.7 9.0 9.2  MG -- -- --  PHOS -- -- --   Liver Function Tests: No results found for this basename: AST:5,ALT:5,ALKPHOS:5,BILITOT:5,PROT:5,ALBUMIN:5 in the last 168 hours No results found for this basename: LIPASE:5,AMYLASE:5 in the last 168 hours No results found for this basename: AMMONIA:5 in the last 168 hours CBC:  Lab 08/18/11 0630 08/17/11 1154 08/14/11 0500  WBC 7.6 9.2 7.2  NEUTROABS -- -- --  HGB 10.8* 12.5* 11.8*  HCT 33.9* 38.4* 37.2*  MCV 89.9 89.5 89.0  PLT 147* 170 176   Cardiac Enzymes: No results found for this basename: CKTOTAL:5,CKMB:5,CKMBINDEX:5,TROPONINI:5 in the last 168 hours BNP: BNP (last 3 results)  Basename 08/12/11 1604 11/29/10 0500 11/28/10 1338  PROBNP 422.7 1050.0* 1356.0*   CBG:  Lab 08/14/11 1106 08/14/11 0718 08/13/11 2150 08/13/11 1708  GLUCAP 108* 119* 98 107*  Time coordinating discharge: 40 mins.  Signed:  Kerie Badger,CHRISTOPHER  [facility] 08/20/2011, 12:04 PM

## 2011-08-20 NOTE — Progress Notes (Signed)
08/20/2011 1030 Faxed orders to [facility] for [facility]. Isidoro Donning RN CCM Case Mgmt phone [phone]

## 2011-08-22 DIAGNOSIS — I10 Essential (primary) hypertension: Secondary | ICD-10-CM | POA: Diagnosis not present

## 2011-08-22 DIAGNOSIS — I5022 Chronic systolic (congestive) heart failure: Secondary | ICD-10-CM | POA: Diagnosis not present

## 2011-08-22 DIAGNOSIS — R279 Unspecified lack of coordination: Secondary | ICD-10-CM | POA: Diagnosis not present

## 2011-08-22 DIAGNOSIS — R42 Dizziness and giddiness: Secondary | ICD-10-CM | POA: Diagnosis not present

## 2011-08-22 DIAGNOSIS — I4891 Unspecified atrial fibrillation: Secondary | ICD-10-CM | POA: Diagnosis not present

## 2011-08-25 ENCOUNTER — Other Ambulatory Visit (HOSPITAL_BASED_OUTPATIENT_CLINIC_OR_DEPARTMENT_OTHER): Payer: Medicare Other | Admitting: Lab

## 2011-08-25 DIAGNOSIS — M6281 Muscle weakness (generalized): Secondary | ICD-10-CM | POA: Diagnosis not present

## 2011-08-25 DIAGNOSIS — R262 Difficulty in walking, not elsewhere classified: Secondary | ICD-10-CM | POA: Diagnosis not present

## 2011-08-25 DIAGNOSIS — R42 Dizziness and giddiness: Secondary | ICD-10-CM | POA: Diagnosis not present

## 2011-08-25 DIAGNOSIS — Z5189 Encounter for other specified aftercare: Secondary | ICD-10-CM | POA: Diagnosis not present

## 2011-08-25 DIAGNOSIS — R279 Unspecified lack of coordination: Secondary | ICD-10-CM | POA: Diagnosis not present

## 2011-08-25 DIAGNOSIS — D509 Iron deficiency anemia, unspecified: Secondary | ICD-10-CM

## 2011-08-25 DIAGNOSIS — I69998 Other sequelae following unspecified cerebrovascular disease: Secondary | ICD-10-CM | POA: Diagnosis not present

## 2011-08-25 DIAGNOSIS — I69993 Ataxia following unspecified cerebrovascular disease: Secondary | ICD-10-CM | POA: Diagnosis not present

## 2011-08-25 DIAGNOSIS — I6200 Nontraumatic subdural hemorrhage, unspecified: Secondary | ICD-10-CM | POA: Diagnosis not present

## 2011-08-25 LAB — CBC WITH DIFFERENTIAL/PLATELET
Basophils Absolute: 0 10*3/uL (ref 0.0–0.1)
EOS%: 1.1 % (ref 0.0–7.0)
Eosinophils Absolute: 0.1 10*3/uL (ref 0.0–0.5)
HCT: 37.3 % — ABNORMAL LOW (ref 38.4–49.9)
HGB: 12.5 g/dL — ABNORMAL LOW (ref 13.0–17.1)
MCH: 30.3 pg (ref 27.2–33.4)
MCV: 90.4 fL (ref 79.3–98.0)
MONO%: 26.1 % — ABNORMAL HIGH (ref 0.0–14.0)
NEUT#: 3.6 10*3/uL (ref 1.5–6.5)
NEUT%: 55.3 % (ref 39.0–75.0)
RDW: 18.3 % — ABNORMAL HIGH (ref 11.0–14.6)

## 2011-08-26 DIAGNOSIS — R42 Dizziness and giddiness: Secondary | ICD-10-CM | POA: Diagnosis not present

## 2011-08-26 DIAGNOSIS — I10 Essential (primary) hypertension: Secondary | ICD-10-CM | POA: Diagnosis not present

## 2011-08-26 DIAGNOSIS — R279 Unspecified lack of coordination: Secondary | ICD-10-CM | POA: Diagnosis not present

## 2011-08-26 DIAGNOSIS — Z79899 Other long term (current) drug therapy: Secondary | ICD-10-CM | POA: Diagnosis not present

## 2011-08-26 DIAGNOSIS — E785 Hyperlipidemia, unspecified: Secondary | ICD-10-CM | POA: Diagnosis not present

## 2011-08-26 DIAGNOSIS — I69993 Ataxia following unspecified cerebrovascular disease: Secondary | ICD-10-CM | POA: Diagnosis not present

## 2011-08-26 DIAGNOSIS — I6200 Nontraumatic subdural hemorrhage, unspecified: Secondary | ICD-10-CM | POA: Diagnosis not present

## 2011-08-26 DIAGNOSIS — E559 Vitamin D deficiency, unspecified: Secondary | ICD-10-CM | POA: Diagnosis not present

## 2011-08-26 DIAGNOSIS — E039 Hypothyroidism, unspecified: Secondary | ICD-10-CM | POA: Diagnosis not present

## 2011-08-26 DIAGNOSIS — M6281 Muscle weakness (generalized): Secondary | ICD-10-CM | POA: Diagnosis not present

## 2011-08-26 DIAGNOSIS — D649 Anemia, unspecified: Secondary | ICD-10-CM | POA: Diagnosis not present

## 2011-08-26 DIAGNOSIS — R262 Difficulty in walking, not elsewhere classified: Secondary | ICD-10-CM | POA: Diagnosis not present

## 2011-08-27 DIAGNOSIS — I69993 Ataxia following unspecified cerebrovascular disease: Secondary | ICD-10-CM | POA: Diagnosis not present

## 2011-08-27 DIAGNOSIS — I4891 Unspecified atrial fibrillation: Secondary | ICD-10-CM | POA: Diagnosis not present

## 2011-08-27 DIAGNOSIS — R279 Unspecified lack of coordination: Secondary | ICD-10-CM | POA: Diagnosis not present

## 2011-08-27 DIAGNOSIS — R51 Headache: Secondary | ICD-10-CM | POA: Diagnosis not present

## 2011-08-27 DIAGNOSIS — M6281 Muscle weakness (generalized): Secondary | ICD-10-CM | POA: Diagnosis not present

## 2011-08-27 DIAGNOSIS — R262 Difficulty in walking, not elsewhere classified: Secondary | ICD-10-CM | POA: Diagnosis not present

## 2011-08-27 DIAGNOSIS — R42 Dizziness and giddiness: Secondary | ICD-10-CM | POA: Diagnosis not present

## 2011-08-27 DIAGNOSIS — E782 Mixed hyperlipidemia: Secondary | ICD-10-CM | POA: Diagnosis not present

## 2011-08-27 DIAGNOSIS — I6200 Nontraumatic subdural hemorrhage, unspecified: Secondary | ICD-10-CM | POA: Diagnosis not present

## 2011-08-29 DIAGNOSIS — R279 Unspecified lack of coordination: Secondary | ICD-10-CM | POA: Diagnosis not present

## 2011-08-29 DIAGNOSIS — M6281 Muscle weakness (generalized): Secondary | ICD-10-CM | POA: Diagnosis not present

## 2011-08-29 DIAGNOSIS — I69998 Other sequelae following unspecified cerebrovascular disease: Secondary | ICD-10-CM | POA: Diagnosis not present

## 2011-08-29 DIAGNOSIS — I69993 Ataxia following unspecified cerebrovascular disease: Secondary | ICD-10-CM | POA: Diagnosis not present

## 2011-08-29 DIAGNOSIS — I6200 Nontraumatic subdural hemorrhage, unspecified: Secondary | ICD-10-CM | POA: Diagnosis not present

## 2011-08-29 DIAGNOSIS — R262 Difficulty in walking, not elsewhere classified: Secondary | ICD-10-CM | POA: Diagnosis not present

## 2011-08-30 ENCOUNTER — Encounter: Payer: Self-pay | Admitting: Internal Medicine

## 2011-08-30 ENCOUNTER — Ambulatory Visit (INDEPENDENT_AMBULATORY_CARE_PROVIDER_SITE_OTHER): Payer: Medicare Other | Admitting: Internal Medicine

## 2011-08-30 ENCOUNTER — Encounter: Payer: Medicare Other | Admitting: Internal Medicine

## 2011-08-30 VITALS — BP 120/64 | HR 68 | Ht 72.0 in | Wt 181.4 lb

## 2011-08-30 DIAGNOSIS — I6200 Nontraumatic subdural hemorrhage, unspecified: Secondary | ICD-10-CM | POA: Diagnosis not present

## 2011-08-30 DIAGNOSIS — Z95810 Presence of automatic (implantable) cardiac defibrillator: Secondary | ICD-10-CM

## 2011-08-30 DIAGNOSIS — I69993 Ataxia following unspecified cerebrovascular disease: Secondary | ICD-10-CM | POA: Diagnosis not present

## 2011-08-30 DIAGNOSIS — I5022 Chronic systolic (congestive) heart failure: Secondary | ICD-10-CM

## 2011-08-30 DIAGNOSIS — R279 Unspecified lack of coordination: Secondary | ICD-10-CM | POA: Diagnosis not present

## 2011-08-30 DIAGNOSIS — M6281 Muscle weakness (generalized): Secondary | ICD-10-CM | POA: Diagnosis not present

## 2011-08-30 DIAGNOSIS — I472 Ventricular tachycardia: Secondary | ICD-10-CM | POA: Diagnosis not present

## 2011-08-30 DIAGNOSIS — R262 Difficulty in walking, not elsewhere classified: Secondary | ICD-10-CM | POA: Diagnosis not present

## 2011-08-30 DIAGNOSIS — I4891 Unspecified atrial fibrillation: Secondary | ICD-10-CM

## 2011-08-30 DIAGNOSIS — R42 Dizziness and giddiness: Secondary | ICD-10-CM | POA: Diagnosis not present

## 2011-08-30 LAB — ICD DEVICE OBSERVATION
AL IMPEDENCE ICD: 494 Ohm
ATRIAL PACING ICD: 60.31 pct
BAMS-0001: 170 {beats}/min
DEV-0020ICD: NEGATIVE
FVT: 0
RV LEAD IMPEDENCE ICD: 494 Ohm
RV LEAD THRESHOLD: 1.125 V
TOT-0001: 1
TZAT-0001FASTVT: 1
TZAT-0002ATACH: NEGATIVE
TZAT-0011SLOWVT: 10 ms
TZAT-0011SLOWVT: 10 ms
TZAT-0012ATACH: 150 ms
TZAT-0012SLOWVT: 170 ms
TZAT-0012SLOWVT: 170 ms
TZAT-0013SLOWVT: 3
TZAT-0013SLOWVT: 4
TZAT-0019ATACH: 6 V
TZAT-0019SLOWVT: 8 V
TZAT-0019SLOWVT: 8 V
TZAT-0020ATACH: 1.5 ms
TZAT-0020ATACH: 1.5 ms
TZAT-0020FASTVT: 1.5 ms
TZON-0003ATACH: 350 ms
TZON-0003SLOWVT: 330 ms
TZON-0004SLOWVT: 28
TZON-0005SLOWVT: 12
TZST-0001ATACH: 4
TZST-0001ATACH: 5
TZST-0001FASTVT: 2
TZST-0001FASTVT: 3
TZST-0001FASTVT: 4
TZST-0001SLOWVT: 4
TZST-0002ATACH: NEGATIVE
TZST-0002FASTVT: NEGATIVE
TZST-0002FASTVT: NEGATIVE
TZST-0002FASTVT: NEGATIVE
TZST-0003SLOWVT: 20 J
TZST-0003SLOWVT: 35 J
VENTRICULAR PACING ICD: 3.65 pct
VF: 0

## 2011-08-30 NOTE — Assessment & Plan Note (Signed)
His device (Medtronic ICD) is working normally. Will recheck in several months.

## 2011-08-30 NOTE — Assessment & Plan Note (Signed)
He appears to be maintaining NSR. Continue amiodarone.

## 2011-08-30 NOTE — Progress Notes (Signed)
HPI Mr. Bessey returns today for followup. He is a pleasant 78 yo man with VT, chronic systolic CHF, class 2, HTN, and HTN. He underwent ICD removal of an abdominal device and placement of a new device several months ago. He denies chest pain, sob, or syncope. No ICD shocks. Allergies  Allergen Reactions  . Celebrex (Celecoxib) Hives and Other (See Comments)    Gi upset  . Digoxin Other (See Comments)    Unknown   . Esomeprazole Magnesium Hives    "don't really remember"  . Multaq (Dronedarone Hydrochloride) Other (See Comments)    "don't remember"        Past Medical History  Diagnosis Date  . Chronic back pain     "top of neck to lower back"  . Ischemic cardiomyopathy     WITH CHF  . CHF (congestive heart failure)     EF 35-40% s/p most recent ICD generator change-out with Medtronic dual-chamber ICD 05/20/11 with explantation of previous abdominally-implanted device  . Hypertension   . Dyslipidemia   . Erythrocytosis   . GERD (gastroesophageal reflux disease)   . Abnormal thyroid scan     Abnormal thyroid imaging studies from 11/09/2010, status post ultrasound guided fine needle aspiration of the dominant left inferior thyroid nodule on 12/15/2010. Cytology report showed rare follicular epithelial cells and hemosiderin laden macrophages.  . Coronary artery disease     s/p CABG 1983 and PCI/stent 2004.   Marland Kitchen Headache   . Atrial fibrillation     on chronic Coumadin  . VT (ventricular tachycardia)   . ICD (implantable cardiac defibrillator) in place   . Atrial fibrillation   . Heart murmur   . Anginal pain   . Myocardial infarction 1983; ~ 1990  . Shortness of breath     "once in awhile when I'm relaxing"  . Asthma   . Diabetes mellitus     diet controlled  . Arthritis     "all over"  . Family history of anesthesia complication     ROS:   All systems reviewed and negative except as noted in the HPI.   Past Surgical History  Procedure Date  . Cardiac  defibrillator placement     replaced April, 2013  . Coronary artery bypass graft 1983  . Cholecystectomy   . Esophagogastroduodenoscopy 02/11/2011    Procedure: ESOPHAGOGASTRODUODENOSCOPY (EGD);  Surgeon: Theda Belfast, MD;  Location: [facility];  Service: Endoscopy;  Laterality: N/A;  . Colonoscopy 07/07/2011    Procedure: COLONOSCOPY;  Surgeon: Beverley Fiedler, MD;  Location: [facility];  Service: Gastroenterology;  Laterality: N/A;  . Tonsillectomy     "done when I was a kid"  . Coronary angioplasty with stent placement 10/17/06    "first and only"  . Knee arthroscopy     right; "just went in and scraped it"     Family History  Problem Relation Age of Onset  . Heart disease Brother   . Diabetes Sister   . Diabetes Brother   . Tuberculosis Mother   . Tuberculosis Father   . Clotting disorder Brother      History   Social History  . Marital Status: Divorced    Spouse Name: N/A    Number of Children: 2  . Years of Education: N/A   Occupational History  . real estate    Social History Main Topics  . Smoking status: Former Smoker -- 2.0 packs/day for 30 years    Types: Cigarettes  Quit date: 06/23/1976  . Smokeless tobacco: Never Used  . Alcohol Use: Yes     08/17/11 "used to drink a little bit; last drink was 4-5 years ago"  . Drug Use: No  . Sexually Active: No   Other Topics Concern  . Not on file   Social History Narrative  . No narrative on file     BP 120/64  Pulse 68  Ht 6' (1.829 m)  Wt 181 lb 6.4 oz (82.283 kg)  BMI 24.60 kg/m2  Physical Exam:  Well appearing elderly man, NAD HEENT: Unremarkable Neck:  No JVD, no thyromegally Lungs:  Clear with no wheezes, rales, or rhonchi. HEART:  Regular rate rhythm, no murmurs, no rubs, no clicks Abd:  soft, positive bowel sounds, no organomegally, no rebound, no guarding Ext:  2 plus pulses, no edema, no cyanosis, no clubbing Skin:  No rashes no nodules Neuro:  CN II through XII intact, motor grossly  intact  DEVICE  Normal device function.  See PaceArt for details.   Assess/Plan:

## 2011-08-30 NOTE — Assessment & Plan Note (Signed)
He appears to be free of VT. Continue amiodarone.

## 2011-08-30 NOTE — Patient Instructions (Addendum)
Your physician wants you to follow-up in: 12 months with Dr Court Joy will receive a reminder letter in the mail two months in advance. If you don't receive a letter, please call our office to schedule the follow-up appointment.   Remote monitoring is used to monitor your Pacemaker of ICD from home. This monitoring reduces the number of office visits required to check your device to one time per year. It allows [place] to keep an eye on the functioning of your device to ensure it is working properly. You are scheduled for a device check from home on 12/05/11. You may send your transmission at any time that day. If you have a wireless device, the transmission will be sent automatically. After your physician reviews your transmission, you will receive a postcard with your next transmission date.

## 2011-08-31 DIAGNOSIS — R262 Difficulty in walking, not elsewhere classified: Secondary | ICD-10-CM | POA: Diagnosis not present

## 2011-08-31 DIAGNOSIS — R279 Unspecified lack of coordination: Secondary | ICD-10-CM | POA: Diagnosis not present

## 2011-08-31 DIAGNOSIS — M6281 Muscle weakness (generalized): Secondary | ICD-10-CM | POA: Diagnosis not present

## 2011-08-31 DIAGNOSIS — I69993 Ataxia following unspecified cerebrovascular disease: Secondary | ICD-10-CM | POA: Diagnosis not present

## 2011-08-31 DIAGNOSIS — I6200 Nontraumatic subdural hemorrhage, unspecified: Secondary | ICD-10-CM | POA: Diagnosis not present

## 2011-08-31 DIAGNOSIS — I69998 Other sequelae following unspecified cerebrovascular disease: Secondary | ICD-10-CM | POA: Diagnosis not present

## 2011-09-01 DIAGNOSIS — J189 Pneumonia, unspecified organism: Secondary | ICD-10-CM

## 2011-09-01 DIAGNOSIS — Z5189 Encounter for other specified aftercare: Secondary | ICD-10-CM | POA: Diagnosis not present

## 2011-09-01 DIAGNOSIS — R262 Difficulty in walking, not elsewhere classified: Secondary | ICD-10-CM | POA: Diagnosis not present

## 2011-09-01 DIAGNOSIS — E039 Hypothyroidism, unspecified: Secondary | ICD-10-CM | POA: Diagnosis not present

## 2011-09-01 DIAGNOSIS — Z79899 Other long term (current) drug therapy: Secondary | ICD-10-CM | POA: Diagnosis not present

## 2011-09-01 DIAGNOSIS — I6200 Nontraumatic subdural hemorrhage, unspecified: Secondary | ICD-10-CM | POA: Diagnosis not present

## 2011-09-01 DIAGNOSIS — M6281 Muscle weakness (generalized): Secondary | ICD-10-CM | POA: Diagnosis not present

## 2011-09-01 DIAGNOSIS — I69998 Other sequelae following unspecified cerebrovascular disease: Secondary | ICD-10-CM | POA: Diagnosis not present

## 2011-09-01 DIAGNOSIS — R279 Unspecified lack of coordination: Secondary | ICD-10-CM | POA: Diagnosis not present

## 2011-09-01 DIAGNOSIS — I69993 Ataxia following unspecified cerebrovascular disease: Secondary | ICD-10-CM | POA: Diagnosis not present

## 2011-09-01 HISTORY — DX: Pneumonia, unspecified organism: J18.9

## 2011-09-07 DIAGNOSIS — I4891 Unspecified atrial fibrillation: Secondary | ICD-10-CM | POA: Diagnosis not present

## 2011-09-07 DIAGNOSIS — I5022 Chronic systolic (congestive) heart failure: Secondary | ICD-10-CM | POA: Diagnosis not present

## 2011-09-07 DIAGNOSIS — R42 Dizziness and giddiness: Secondary | ICD-10-CM | POA: Diagnosis not present

## 2011-09-07 DIAGNOSIS — R279 Unspecified lack of coordination: Secondary | ICD-10-CM | POA: Diagnosis not present

## 2011-09-07 DIAGNOSIS — I10 Essential (primary) hypertension: Secondary | ICD-10-CM | POA: Diagnosis not present

## 2011-09-08 ENCOUNTER — Encounter: Payer: Self-pay | Admitting: Cardiology

## 2011-09-08 ENCOUNTER — Other Ambulatory Visit: Payer: Self-pay | Admitting: Internal Medicine

## 2011-09-08 ENCOUNTER — Ambulatory Visit (INDEPENDENT_AMBULATORY_CARE_PROVIDER_SITE_OTHER): Payer: Medicare Other | Admitting: Cardiology

## 2011-09-08 VITALS — BP 102/57 | HR 65 | Ht 72.0 in | Wt 181.8 lb

## 2011-09-08 DIAGNOSIS — I509 Heart failure, unspecified: Secondary | ICD-10-CM

## 2011-09-08 DIAGNOSIS — I2510 Atherosclerotic heart disease of native coronary artery without angina pectoris: Secondary | ICD-10-CM

## 2011-09-08 DIAGNOSIS — I4891 Unspecified atrial fibrillation: Secondary | ICD-10-CM

## 2011-09-08 DIAGNOSIS — S065X9A Traumatic subdural hemorrhage with loss of consciousness of unspecified duration, initial encounter: Secondary | ICD-10-CM

## 2011-09-08 DIAGNOSIS — I5022 Chronic systolic (congestive) heart failure: Secondary | ICD-10-CM

## 2011-09-08 DIAGNOSIS — I6200 Nontraumatic subdural hemorrhage, unspecified: Secondary | ICD-10-CM | POA: Diagnosis not present

## 2011-09-08 DIAGNOSIS — Z8679 Personal history of other diseases of the circulatory system: Secondary | ICD-10-CM | POA: Insufficient documentation

## 2011-09-08 NOTE — Patient Instructions (Addendum)
Reduce lisinopril to 10 mg daily  Continue coreg 12.5 mg twice a day and Lasix 40 mg once a day.  Stop ASA and coumadin  I will see you again in 4 months.

## 2011-09-08 NOTE — Assessment & Plan Note (Signed)
He appears to be well compensated today. We will continue with the lower dose of Lasix 40 mg daily. Also reduce dose of carvedilol 12.5 mg twice a day. Reduce lisinopril to 10 mg once a day. Continue sodium restriction.

## 2011-09-08 NOTE — Progress Notes (Signed)
[name] Date of Birth: Sep 21, 1932   History of Present Illness: [name] is seen back today for followup. He was hospitalized this month with severe headache. He was found to have bilateral subdural hematomas in the frontal area. His Coumadin and aspirin were discontinued. He was also somewhat dehydrated and hypotensive. His Lasix was discontinued. His Coreg dose was reduced. Since discharge he has resumed his Lasix at 40 mg once a day. He was in rehabilitation for a period of time but is now back home again. He did have his ICD revised in April of this year. His followup check in July was normal. He states his breathing is doing well. He denies any significant increase in edema. He denies any chest pain or dizziness. He is a little more unsteady in his gait.  Current Outpatient Prescriptions on File Prior to Visit  Medication Sig Dispense Refill  . amiodarone (PACERONE) 200 MG tablet Take 1 tablet (200 mg total) by mouth daily.  90 tablet  3  . bisacodyl (DULCOLAX) 10 MG suppository Place 10 mg rectally as needed. constipation      . calcium carbonate (TUMS - DOSED IN MG ELEMENTAL CALCIUM) 500 MG chewable tablet Chew 3 tablets by mouth as needed. For heart burn      . carboxymethylcellulose (REFRESH) 1 % ophthalmic solution Place 1 drop into both eyes 3 (three) times daily as needed. Dry eyes.      . carvedilol (COREG) 12.5 MG tablet Take 1 tablet (12.5 mg total) by mouth 2 (two) times daily with a meal.  180 tablet  0  . cycloSPORINE (RESTASIS) 0.05 % ophthalmic emulsion Place 1 drop into both eyes 2 (two) times daily.       Marland Kitchen dexlansoprazole (DEXILANT) 60 MG capsule Take 1 capsule (60 mg total) by mouth daily.  90 capsule  3  . fluticasone (FLONASE) 50 MCG/ACT nasal spray Place 2 sprays into the nose daily.       . furosemide (LASIX) 40 MG tablet Take 40 mg by mouth daily.       Marland Kitchen HYDROcodone-acetaminophen (VICODIN) 5-500 MG per tablet Take 1 tablet by mouth every 6 (six) hours as  needed.      Marland Kitchen lisinopril (PRINIVIL,ZESTRIL) 10 MG tablet Take 10 mg by mouth daily.       Marland Kitchen loratadine (CLARITIN) 10 MG tablet Take 10 mg by mouth daily.       Marland Kitchen omega-3 acid ethyl esters (LOVAZA) 1 G capsule Take 1 g by mouth 2 (two) times daily.       . polyethylene glycol (MIRALAX / GLYCOLAX) packet Take 17 g by mouth 2 (two) times daily as needed. For constipation      . potassium chloride (K-DUR,KLOR-CON) 10 MEQ tablet Take 10 mEq by mouth daily.      . rosuvastatin (CRESTOR) 5 MG tablet Take 5 mg by mouth once a week. Sunday        Allergies  Allergen Reactions  . Celebrex (Celecoxib) Hives and Other (See Comments)    Gi upset  . Digoxin Other (See Comments)    Unknown   . Esomeprazole Magnesium Hives    "don't really remember"  . Multaq (Dronedarone Hydrochloride) Other (See Comments)    "don't remember"    Past Medical History  Diagnosis Date  . Chronic back pain     "top of neck to lower back"  . Ischemic cardiomyopathy     WITH CHF  . CHF (congestive heart failure)  EF 35-40% s/p most recent ICD generator change-out with Medtronic dual-chamber ICD 05/20/11 with explantation of previous abdominally-implanted device  . Hypertension   . Dyslipidemia   . Erythrocytosis   . GERD (gastroesophageal reflux disease)   . Abnormal thyroid scan     Abnormal thyroid imaging studies from 11/09/2010, status post ultrasound guided fine needle aspiration of the dominant left inferior thyroid nodule on 12/15/2010. Cytology report showed rare follicular epithelial cells and hemosiderin laden macrophages.  . Coronary artery disease     s/p CABG 1983 and PCI/stent 2004.   Marland Kitchen Headache   . Atrial fibrillation     on chronic Coumadin  . VT (ventricular tachycardia)   . ICD (implantable cardiac defibrillator) in place   . Atrial fibrillation   . Heart murmur   . Anginal pain   . Myocardial infarction 1983; ~ 1990  . Shortness of breath     "once in awhile when I'm relaxing"  .  Asthma   . Diabetes mellitus     diet controlled  . Arthritis     "all over"  . Family history of anesthesia complication     Past Surgical History  Procedure Date  . Cardiac defibrillator placement     replaced April, 2013  . Coronary artery bypass graft 1983  . Cholecystectomy   . Esophagogastroduodenoscopy 02/11/2011    Procedure: ESOPHAGOGASTRODUODENOSCOPY (EGD);  Surgeon: Theda Belfast, MD;  Location: [facility];  Service: Endoscopy;  Laterality: N/A;  . Colonoscopy 07/07/2011    Procedure: COLONOSCOPY;  Surgeon: Beverley Fiedler, MD;  Location: [facility];  Service: Gastroenterology;  Laterality: N/A;  . Tonsillectomy     "done when I was a kid"  . Coronary angioplasty with stent placement 10/17/06    "first and only"  . Knee arthroscopy     right; "just went in and scraped it"    History  Smoking status  . Former Smoker -- 2.0 packs/day for 30 years  . Types: Cigarettes  . Quit date: 06/23/1976  Smokeless tobacco  . Never Used    History  Alcohol Use  . Yes    08/17/11 "used to drink a little bit; last drink was 4-5 years ago"    Family History  Problem Relation Age of Onset  . Heart disease Brother   . Diabetes Sister   . Diabetes Brother   . Tuberculosis Mother   . Tuberculosis Father   . Clotting disorder Brother     Review of Systems: As noted in history of present illness. All other systems were reviewed and are negative.  Physical Exam: BP 102/57  Pulse 65  Ht 6' (1.829 m)  Wt 82.464 kg (181 lb 12.8 oz)  BMI 24.66 kg/m2  Patient is pleasant and in no acute distress. He does appear chronically ill. Skin is warm and dry. Color is chronically sallow.  HEENT is unremarkable. Normocephalic/atraumatic. PERRL. Sclera are nonicteric. Neck is supple. No masses. No JVD. Lungs are  clear. Cardiac exam shows a regular rate and rhythm. Abdomen is soft. Extremities reveal trace edema. Gait and ROM are intact. No gross neurologic deficits noted.   LABORATORY  DATA:    Assessment / Plan:

## 2011-09-08 NOTE — Assessment & Plan Note (Signed)
He denies any significant anginal symptoms. He is off of antiplatelet therapy because of his subdural hematomas. Would consider resuming a baby aspirin once a day once his hematomas have resolved. He would need to stay off of Coumadin indefinitely.

## 2011-09-08 NOTE — Assessment & Plan Note (Signed)
Given his recent spontaneous subdural hematoma he is not a candidate for anticoagulation in the future. We will need to try to maintain sinus rhythm on amiodarone.

## 2011-09-09 DIAGNOSIS — I5022 Chronic systolic (congestive) heart failure: Secondary | ICD-10-CM | POA: Diagnosis not present

## 2011-09-09 DIAGNOSIS — I4891 Unspecified atrial fibrillation: Secondary | ICD-10-CM | POA: Diagnosis not present

## 2011-09-09 DIAGNOSIS — R42 Dizziness and giddiness: Secondary | ICD-10-CM | POA: Diagnosis not present

## 2011-09-09 DIAGNOSIS — I10 Essential (primary) hypertension: Secondary | ICD-10-CM | POA: Diagnosis not present

## 2011-09-09 DIAGNOSIS — R279 Unspecified lack of coordination: Secondary | ICD-10-CM | POA: Diagnosis not present

## 2011-09-12 DIAGNOSIS — I5022 Chronic systolic (congestive) heart failure: Secondary | ICD-10-CM | POA: Diagnosis not present

## 2011-09-12 DIAGNOSIS — R42 Dizziness and giddiness: Secondary | ICD-10-CM | POA: Diagnosis not present

## 2011-09-12 DIAGNOSIS — R279 Unspecified lack of coordination: Secondary | ICD-10-CM | POA: Diagnosis not present

## 2011-09-12 DIAGNOSIS — I10 Essential (primary) hypertension: Secondary | ICD-10-CM | POA: Diagnosis not present

## 2011-09-12 DIAGNOSIS — I4891 Unspecified atrial fibrillation: Secondary | ICD-10-CM | POA: Diagnosis not present

## 2011-09-13 DIAGNOSIS — I4891 Unspecified atrial fibrillation: Secondary | ICD-10-CM | POA: Diagnosis not present

## 2011-09-13 DIAGNOSIS — I5022 Chronic systolic (congestive) heart failure: Secondary | ICD-10-CM | POA: Diagnosis not present

## 2011-09-13 DIAGNOSIS — R279 Unspecified lack of coordination: Secondary | ICD-10-CM | POA: Diagnosis not present

## 2011-09-13 DIAGNOSIS — I10 Essential (primary) hypertension: Secondary | ICD-10-CM | POA: Diagnosis not present

## 2011-09-13 DIAGNOSIS — R42 Dizziness and giddiness: Secondary | ICD-10-CM | POA: Diagnosis not present

## 2011-09-14 DIAGNOSIS — I4891 Unspecified atrial fibrillation: Secondary | ICD-10-CM | POA: Diagnosis not present

## 2011-09-14 DIAGNOSIS — R42 Dizziness and giddiness: Secondary | ICD-10-CM | POA: Diagnosis not present

## 2011-09-14 DIAGNOSIS — I5022 Chronic systolic (congestive) heart failure: Secondary | ICD-10-CM | POA: Diagnosis not present

## 2011-09-14 DIAGNOSIS — I10 Essential (primary) hypertension: Secondary | ICD-10-CM | POA: Diagnosis not present

## 2011-09-14 DIAGNOSIS — R279 Unspecified lack of coordination: Secondary | ICD-10-CM | POA: Diagnosis not present

## 2011-09-15 ENCOUNTER — Telehealth: Payer: Self-pay | Admitting: Oncology

## 2011-09-15 ENCOUNTER — Ambulatory Visit (HOSPITAL_BASED_OUTPATIENT_CLINIC_OR_DEPARTMENT_OTHER): Payer: Medicare Other | Admitting: Lab

## 2011-09-15 DIAGNOSIS — R279 Unspecified lack of coordination: Secondary | ICD-10-CM | POA: Diagnosis not present

## 2011-09-15 DIAGNOSIS — I4891 Unspecified atrial fibrillation: Secondary | ICD-10-CM | POA: Diagnosis not present

## 2011-09-15 DIAGNOSIS — D509 Iron deficiency anemia, unspecified: Secondary | ICD-10-CM | POA: Diagnosis not present

## 2011-09-15 DIAGNOSIS — R42 Dizziness and giddiness: Secondary | ICD-10-CM | POA: Diagnosis not present

## 2011-09-15 DIAGNOSIS — I5022 Chronic systolic (congestive) heart failure: Secondary | ICD-10-CM | POA: Diagnosis not present

## 2011-09-15 DIAGNOSIS — I10 Essential (primary) hypertension: Secondary | ICD-10-CM | POA: Diagnosis not present

## 2011-09-15 LAB — FERRITIN: Ferritin: 55 ng/mL (ref 22–322)

## 2011-09-15 LAB — CBC WITH DIFFERENTIAL/PLATELET
Eosinophils Absolute: 0.1 10*3/uL (ref 0.0–0.5)
LYMPH%: 12.9 % — ABNORMAL LOW (ref 14.0–49.0)
MCV: 89.1 fL (ref 79.3–98.0)
MONO%: 19.8 % — ABNORMAL HIGH (ref 0.0–14.0)
NEUT#: 5.2 10*3/uL (ref 1.5–6.5)
NEUT%: 66.2 % (ref 39.0–75.0)
Platelets: 160 10*3/uL (ref 140–400)
RBC: 4.31 10*6/uL (ref 4.20–5.82)

## 2011-09-15 NOTE — Telephone Encounter (Signed)
PT CAME IN FOR HIS LAB TODAY BUT WAS A WEEK EARLY,PT REQ TO DO LAB TODAY,MADE A ADD ON     AOM

## 2011-09-16 DIAGNOSIS — R42 Dizziness and giddiness: Secondary | ICD-10-CM | POA: Diagnosis not present

## 2011-09-16 DIAGNOSIS — I4891 Unspecified atrial fibrillation: Secondary | ICD-10-CM | POA: Diagnosis not present

## 2011-09-16 DIAGNOSIS — I5022 Chronic systolic (congestive) heart failure: Secondary | ICD-10-CM | POA: Diagnosis not present

## 2011-09-16 DIAGNOSIS — R279 Unspecified lack of coordination: Secondary | ICD-10-CM | POA: Diagnosis not present

## 2011-09-16 DIAGNOSIS — I10 Essential (primary) hypertension: Secondary | ICD-10-CM | POA: Diagnosis not present

## 2011-09-19 DIAGNOSIS — I5022 Chronic systolic (congestive) heart failure: Secondary | ICD-10-CM | POA: Diagnosis not present

## 2011-09-19 DIAGNOSIS — I6200 Nontraumatic subdural hemorrhage, unspecified: Secondary | ICD-10-CM | POA: Diagnosis not present

## 2011-09-19 DIAGNOSIS — E060 Acute thyroiditis: Secondary | ICD-10-CM | POA: Diagnosis not present

## 2011-09-19 DIAGNOSIS — I10 Essential (primary) hypertension: Secondary | ICD-10-CM | POA: Diagnosis not present

## 2011-09-19 DIAGNOSIS — R279 Unspecified lack of coordination: Secondary | ICD-10-CM | POA: Diagnosis not present

## 2011-09-19 DIAGNOSIS — R269 Unspecified abnormalities of gait and mobility: Secondary | ICD-10-CM | POA: Diagnosis not present

## 2011-09-19 DIAGNOSIS — I4891 Unspecified atrial fibrillation: Secondary | ICD-10-CM | POA: Diagnosis not present

## 2011-09-19 DIAGNOSIS — R5383 Other fatigue: Secondary | ICD-10-CM | POA: Diagnosis not present

## 2011-09-19 DIAGNOSIS — R5381 Other malaise: Secondary | ICD-10-CM | POA: Diagnosis not present

## 2011-09-19 DIAGNOSIS — R42 Dizziness and giddiness: Secondary | ICD-10-CM | POA: Diagnosis not present

## 2011-09-20 DIAGNOSIS — I6200 Nontraumatic subdural hemorrhage, unspecified: Secondary | ICD-10-CM | POA: Diagnosis not present

## 2011-09-20 DIAGNOSIS — E060 Acute thyroiditis: Secondary | ICD-10-CM | POA: Diagnosis not present

## 2011-09-20 DIAGNOSIS — R5383 Other fatigue: Secondary | ICD-10-CM | POA: Diagnosis not present

## 2011-09-20 DIAGNOSIS — E119 Type 2 diabetes mellitus without complications: Secondary | ICD-10-CM | POA: Diagnosis not present

## 2011-09-21 DIAGNOSIS — R279 Unspecified lack of coordination: Secondary | ICD-10-CM | POA: Diagnosis not present

## 2011-09-21 DIAGNOSIS — R42 Dizziness and giddiness: Secondary | ICD-10-CM | POA: Diagnosis not present

## 2011-09-21 DIAGNOSIS — I4891 Unspecified atrial fibrillation: Secondary | ICD-10-CM | POA: Diagnosis not present

## 2011-09-21 DIAGNOSIS — I5022 Chronic systolic (congestive) heart failure: Secondary | ICD-10-CM | POA: Diagnosis not present

## 2011-09-21 DIAGNOSIS — I10 Essential (primary) hypertension: Secondary | ICD-10-CM | POA: Diagnosis not present

## 2011-09-22 ENCOUNTER — Other Ambulatory Visit: Payer: Medicare Other | Admitting: Lab

## 2011-09-22 DIAGNOSIS — I5022 Chronic systolic (congestive) heart failure: Secondary | ICD-10-CM | POA: Diagnosis not present

## 2011-09-22 DIAGNOSIS — R279 Unspecified lack of coordination: Secondary | ICD-10-CM | POA: Diagnosis not present

## 2011-09-22 DIAGNOSIS — I10 Essential (primary) hypertension: Secondary | ICD-10-CM | POA: Diagnosis not present

## 2011-09-22 DIAGNOSIS — I4891 Unspecified atrial fibrillation: Secondary | ICD-10-CM | POA: Diagnosis not present

## 2011-09-22 DIAGNOSIS — R42 Dizziness and giddiness: Secondary | ICD-10-CM | POA: Diagnosis not present

## 2011-09-23 ENCOUNTER — Inpatient Hospital Stay (HOSPITAL_COMMUNITY): Payer: Medicare Other

## 2011-09-23 ENCOUNTER — Encounter (HOSPITAL_COMMUNITY): Payer: Self-pay | Admitting: *Deleted

## 2011-09-23 ENCOUNTER — Inpatient Hospital Stay (HOSPITAL_COMMUNITY)
Admission: EM | Admit: 2011-09-23 | Discharge: 2011-10-11 | DRG: 871 | Disposition: A | Discharge status: Home Health | Payer: Medicare Other | Attending: Internal Medicine | Admitting: Internal Medicine

## 2011-09-23 ENCOUNTER — Emergency Department (HOSPITAL_COMMUNITY): Payer: Medicare Other

## 2011-09-23 DIAGNOSIS — J90 Pleural effusion, not elsewhere classified: Secondary | ICD-10-CM | POA: Diagnosis present

## 2011-09-23 DIAGNOSIS — E860 Dehydration: Secondary | ICD-10-CM | POA: Diagnosis present

## 2011-09-23 DIAGNOSIS — A419 Sepsis, unspecified organism: Principal | ICD-10-CM | POA: Diagnosis present

## 2011-09-23 DIAGNOSIS — I4891 Unspecified atrial fibrillation: Secondary | ICD-10-CM | POA: Diagnosis not present

## 2011-09-23 DIAGNOSIS — Z951 Presence of aortocoronary bypass graft: Secondary | ICD-10-CM | POA: Diagnosis not present

## 2011-09-23 DIAGNOSIS — I5022 Chronic systolic (congestive) heart failure: Secondary | ICD-10-CM | POA: Diagnosis not present

## 2011-09-23 DIAGNOSIS — R918 Other nonspecific abnormal finding of lung field: Secondary | ICD-10-CM | POA: Diagnosis not present

## 2011-09-23 DIAGNOSIS — R197 Diarrhea, unspecified: Secondary | ICD-10-CM | POA: Diagnosis not present

## 2011-09-23 DIAGNOSIS — I509 Heart failure, unspecified: Secondary | ICD-10-CM | POA: Diagnosis not present

## 2011-09-23 DIAGNOSIS — R1013 Epigastric pain: Secondary | ICD-10-CM

## 2011-09-23 DIAGNOSIS — Z8679 Personal history of other diseases of the circulatory system: Secondary | ICD-10-CM

## 2011-09-23 DIAGNOSIS — I482 Chronic atrial fibrillation, unspecified: Secondary | ICD-10-CM | POA: Diagnosis present

## 2011-09-23 DIAGNOSIS — E119 Type 2 diabetes mellitus without complications: Secondary | ICD-10-CM | POA: Diagnosis present

## 2011-09-23 DIAGNOSIS — N179 Acute kidney failure, unspecified: Secondary | ICD-10-CM | POA: Diagnosis present

## 2011-09-23 DIAGNOSIS — K219 Gastro-esophageal reflux disease without esophagitis: Secondary | ICD-10-CM | POA: Diagnosis present

## 2011-09-23 DIAGNOSIS — J9600 Acute respiratory failure, unspecified whether with hypoxia or hypercapnia: Secondary | ICD-10-CM | POA: Diagnosis not present

## 2011-09-23 DIAGNOSIS — R601 Generalized edema: Secondary | ICD-10-CM

## 2011-09-23 DIAGNOSIS — I10 Essential (primary) hypertension: Secondary | ICD-10-CM | POA: Diagnosis present

## 2011-09-23 DIAGNOSIS — R270 Ataxia, unspecified: Secondary | ICD-10-CM | POA: Diagnosis present

## 2011-09-23 DIAGNOSIS — R6510 Systemic inflammatory response syndrome (SIRS) of non-infectious origin without acute organ dysfunction: Secondary | ICD-10-CM | POA: Diagnosis present

## 2011-09-23 DIAGNOSIS — Z7901 Long term (current) use of anticoagulants: Secondary | ICD-10-CM

## 2011-09-23 DIAGNOSIS — R279 Unspecified lack of coordination: Secondary | ICD-10-CM | POA: Diagnosis present

## 2011-09-23 DIAGNOSIS — Z9861 Coronary angioplasty status: Secondary | ICD-10-CM

## 2011-09-23 DIAGNOSIS — R0789 Other chest pain: Secondary | ICD-10-CM | POA: Diagnosis not present

## 2011-09-23 DIAGNOSIS — J189 Pneumonia, unspecified organism: Secondary | ICD-10-CM | POA: Diagnosis not present

## 2011-09-23 DIAGNOSIS — R091 Pleurisy: Secondary | ICD-10-CM | POA: Diagnosis not present

## 2011-09-23 DIAGNOSIS — R6521 Severe sepsis with septic shock: Secondary | ICD-10-CM | POA: Diagnosis not present

## 2011-09-23 DIAGNOSIS — I472 Ventricular tachycardia, unspecified: Secondary | ICD-10-CM | POA: Diagnosis present

## 2011-09-23 DIAGNOSIS — J438 Other emphysema: Secondary | ICD-10-CM | POA: Diagnosis not present

## 2011-09-23 DIAGNOSIS — E785 Hyperlipidemia, unspecified: Secondary | ICD-10-CM | POA: Diagnosis present

## 2011-09-23 DIAGNOSIS — I4729 Other ventricular tachycardia: Secondary | ICD-10-CM | POA: Diagnosis present

## 2011-09-23 DIAGNOSIS — M1990 Unspecified osteoarthritis, unspecified site: Secondary | ICD-10-CM | POA: Diagnosis present

## 2011-09-23 DIAGNOSIS — I5023 Acute on chronic systolic (congestive) heart failure: Secondary | ICD-10-CM | POA: Diagnosis present

## 2011-09-23 DIAGNOSIS — I25119 Atherosclerotic heart disease of native coronary artery with unspecified angina pectoris: Secondary | ICD-10-CM | POA: Diagnosis present

## 2011-09-23 DIAGNOSIS — B379 Candidiasis, unspecified: Secondary | ICD-10-CM | POA: Diagnosis not present

## 2011-09-23 DIAGNOSIS — I2510 Atherosclerotic heart disease of native coronary artery without angina pectoris: Secondary | ICD-10-CM | POA: Diagnosis present

## 2011-09-23 DIAGNOSIS — J45909 Unspecified asthma, uncomplicated: Secondary | ICD-10-CM | POA: Diagnosis present

## 2011-09-23 DIAGNOSIS — R51 Headache: Secondary | ICD-10-CM | POA: Diagnosis not present

## 2011-09-23 DIAGNOSIS — R531 Weakness: Secondary | ICD-10-CM | POA: Diagnosis present

## 2011-09-23 DIAGNOSIS — R404 Transient alteration of awareness: Secondary | ICD-10-CM | POA: Diagnosis not present

## 2011-09-23 DIAGNOSIS — R071 Chest pain on breathing: Secondary | ICD-10-CM | POA: Diagnosis present

## 2011-09-23 DIAGNOSIS — I2589 Other forms of chronic ischemic heart disease: Secondary | ICD-10-CM | POA: Diagnosis present

## 2011-09-23 DIAGNOSIS — Z87891 Personal history of nicotine dependence: Secondary | ICD-10-CM | POA: Diagnosis not present

## 2011-09-23 DIAGNOSIS — K5520 Angiodysplasia of colon without hemorrhage: Secondary | ICD-10-CM

## 2011-09-23 DIAGNOSIS — Z95810 Presence of automatic (implantable) cardiac defibrillator: Secondary | ICD-10-CM | POA: Diagnosis present

## 2011-09-23 DIAGNOSIS — D509 Iron deficiency anemia, unspecified: Secondary | ICD-10-CM | POA: Diagnosis not present

## 2011-09-23 DIAGNOSIS — K635 Polyp of colon: Secondary | ICD-10-CM

## 2011-09-23 DIAGNOSIS — I959 Hypotension, unspecified: Secondary | ICD-10-CM | POA: Diagnosis present

## 2011-09-23 DIAGNOSIS — R001 Bradycardia, unspecified: Secondary | ICD-10-CM | POA: Diagnosis present

## 2011-09-23 DIAGNOSIS — I219 Acute myocardial infarction, unspecified: Secondary | ICD-10-CM | POA: Diagnosis not present

## 2011-09-23 DIAGNOSIS — R079 Chest pain, unspecified: Secondary | ICD-10-CM | POA: Diagnosis not present

## 2011-09-23 DIAGNOSIS — D696 Thrombocytopenia, unspecified: Secondary | ICD-10-CM | POA: Diagnosis present

## 2011-09-23 DIAGNOSIS — R42 Dizziness and giddiness: Secondary | ICD-10-CM

## 2011-09-23 LAB — COMPREHENSIVE METABOLIC PANEL
AST: 42 U/L — ABNORMAL HIGH (ref 0–37)
Albumin: 2.6 g/dL — ABNORMAL LOW (ref 3.5–5.2)
BUN: 45 mg/dL — ABNORMAL HIGH (ref 6–23)
Calcium: 8.4 mg/dL (ref 8.4–10.5)
Creatinine, Ser: 2.63 mg/dL — ABNORMAL HIGH (ref 0.50–1.35)
Total Protein: 5.7 g/dL — ABNORMAL LOW (ref 6.0–8.3)

## 2011-09-23 LAB — CBC
Hemoglobin: 12.3 g/dL — ABNORMAL LOW (ref 13.0–17.0)
RBC: 4.27 MIL/uL (ref 4.22–5.81)
WBC: 20 10*3/uL — ABNORMAL HIGH (ref 4.0–10.5)

## 2011-09-23 LAB — CBC WITH DIFFERENTIAL/PLATELET
Basophils Absolute: 0 10*3/uL (ref 0.0–0.1)
Basophils Relative: 0 % (ref 0–1)
Eosinophils Absolute: 0 10*3/uL (ref 0.0–0.7)
Eosinophils Relative: 0 % (ref 0–5)
HCT: 35.4 % — ABNORMAL LOW (ref 39.0–52.0)
MCH: 28.5 pg (ref 26.0–34.0)
MCHC: 32.5 g/dL (ref 30.0–36.0)
MCV: 87.6 fL (ref 78.0–100.0)
Monocytes Absolute: 3.3 10*3/uL — ABNORMAL HIGH (ref 0.1–1.0)
Neutro Abs: 13.7 10*3/uL — ABNORMAL HIGH (ref 1.7–7.7)
RDW: 16.3 % — ABNORMAL HIGH (ref 11.5–15.5)

## 2011-09-23 LAB — URINALYSIS, ROUTINE W REFLEX MICROSCOPIC
Glucose, UA: NEGATIVE mg/dL
Nitrite: POSITIVE — AB
pH: 5 (ref 5.0–8.0)

## 2011-09-23 LAB — EXPECTORATED SPUTUM ASSESSMENT W GRAM STAIN, RFLX TO RESP C

## 2011-09-23 LAB — LACTIC ACID, PLASMA: Lactic Acid, Venous: 2.4 mmol/L — ABNORMAL HIGH (ref 0.5–2.2)

## 2011-09-23 LAB — URINE MICROSCOPIC-ADD ON

## 2011-09-23 LAB — LIPASE, BLOOD: Lipase: 8 U/L — ABNORMAL LOW (ref 11–59)

## 2011-09-23 MED ORDER — SODIUM CHLORIDE 0.9 % IV BOLUS (SEPSIS)
1000.0000 mL | Freq: Once | INTRAVENOUS | Status: AC
  Administered 2011-09-23: 1000 mL via INTRAVENOUS

## 2011-09-23 MED ORDER — ONDANSETRON HCL 4 MG PO TABS: 4.0000 mg | ORAL_TABLET | Freq: Four times a day (QID) | ORAL | Status: DC | PRN

## 2011-09-23 MED ORDER — PHENYLEPHRINE HCL 10 MG/ML IJ SOLN
30.0000 ug/min | INTRAVENOUS | Status: DC
  Administered 2011-09-23: 30 ug/min via INTRAVENOUS
  Filled 2011-09-23: qty 1

## 2011-09-23 MED ORDER — PANTOPRAZOLE SODIUM 40 MG PO TBEC
40.0000 mg | DELAYED_RELEASE_TABLET | Freq: Every day | ORAL | Status: DC
  Administered 2011-09-24: 40 mg via ORAL
  Filled 2011-09-23: qty 1

## 2011-09-23 MED ORDER — HYDROCORTISONE SOD SUCCINATE 100 MG IJ SOLR
100.0000 mg | Freq: Three times a day (TID) | INTRAMUSCULAR | Status: DC
  Administered 2011-09-23 – 2011-09-25 (×5): 100 mg via INTRAVENOUS
  Filled 2011-09-23 (×8): qty 2

## 2011-09-23 MED ORDER — ONDANSETRON HCL 4 MG/2ML IJ SOLN
4.0000 mg | Freq: Four times a day (QID) | INTRAMUSCULAR | Status: DC | PRN
  Administered 2011-09-25 – 2011-10-10 (×6): 4 mg via INTRAVENOUS
  Filled 2011-09-23 (×6): qty 2

## 2011-09-23 MED ORDER — ACETAMINOPHEN 325 MG PO TABS
650.0000 mg | ORAL_TABLET | Freq: Four times a day (QID) | ORAL | Status: DC | PRN
  Administered 2011-09-24 (×2): 650 mg via ORAL
  Filled 2011-09-23 (×3): qty 2

## 2011-09-23 MED ORDER — VANCOMYCIN HCL IN DEXTROSE 1-5 GM/200ML-% IV SOLN
1000.0000 mg | INTRAVENOUS | Status: DC
  Administered 2011-09-24: 1000 mg via INTRAVENOUS
  Filled 2011-09-23 (×2): qty 200

## 2011-09-23 MED ORDER — ONDANSETRON HCL 4 MG/2ML IJ SOLN
4.0000 mg | Freq: Once | INTRAMUSCULAR | Status: AC
  Administered 2011-09-23: 4 mg via INTRAVENOUS
  Filled 2011-09-23: qty 2

## 2011-09-23 MED ORDER — NOREPINEPHRINE BITARTRATE 1 MG/ML IJ SOLN
2.0000 ug/min | INTRAVENOUS | Status: DC | PRN
  Filled 2011-09-23: qty 4

## 2011-09-23 MED ORDER — ATORVASTATIN CALCIUM 10 MG PO TABS
10.0000 mg | ORAL_TABLET | Freq: Every day | ORAL | Status: DC
  Administered 2011-09-23 – 2011-10-10 (×18): 10 mg via ORAL
  Filled 2011-09-23 (×21): qty 1

## 2011-09-23 MED ORDER — ACETAMINOPHEN 650 MG RE SUPP: 650.0000 mg | Freq: Four times a day (QID) | RECTAL | Status: DC | PRN

## 2011-09-23 MED ORDER — VANCOMYCIN HCL IN DEXTROSE 1-5 GM/200ML-% IV SOLN
1000.0000 mg | Freq: Once | INTRAVENOUS | Status: AC
  Administered 2011-09-23: 1000 mg via INTRAVENOUS
  Filled 2011-09-23 (×2): qty 200

## 2011-09-23 MED ORDER — SODIUM CHLORIDE 0.9 % IJ SOLN
3.0000 mL | Freq: Two times a day (BID) | INTRAMUSCULAR | Status: DC
Start: 2011-09-23 — End: 2011-10-11
  Administered 2011-09-24 – 2011-10-10 (×19): 3 mL via INTRAVENOUS

## 2011-09-23 MED ORDER — INSULIN ASPART 100 UNIT/ML ~~LOC~~ SOLN
0.0000 [IU] | SUBCUTANEOUS | Status: DC
  Administered 2011-09-24 – 2011-09-25 (×3): 1 [IU] via SUBCUTANEOUS

## 2011-09-23 MED ORDER — CARBOXYMETHYLCELLULOSE SODIUM 1 % OP SOLN: 1.0000 [drp] | Freq: Three times a day (TID) | OPHTHALMIC | Status: DC | PRN

## 2011-09-23 MED ORDER — AMIODARONE HCL 200 MG PO TABS
200.0000 mg | ORAL_TABLET | Freq: Every day | ORAL | Status: DC
  Administered 2011-09-23: 200 mg via ORAL
  Filled 2011-09-23: qty 1

## 2011-09-23 MED ORDER — PIPERACILLIN-TAZOBACTAM 3.375 G IVPB
3.3750 g | Freq: Once | INTRAVENOUS | Status: DC
  Filled 2011-09-23 (×2): qty 50

## 2011-09-23 MED ORDER — OMEGA-3-ACID ETHYL ESTERS 1 G PO CAPS
1.0000 g | ORAL_CAPSULE | Freq: Two times a day (BID) | ORAL | Status: DC
  Administered 2011-09-23 – 2011-10-11 (×36): 1 g via ORAL
  Filled 2011-09-23 (×39): qty 1

## 2011-09-23 MED ORDER — SODIUM CHLORIDE 0.9 % IV BOLUS (SEPSIS)
500.0000 mL | Freq: Once | INTRAVENOUS | Status: AC
  Administered 2011-09-23: 500 mL via INTRAVENOUS

## 2011-09-23 MED ORDER — CYCLOSPORINE 0.05 % OP EMUL
1.0000 [drp] | Freq: Two times a day (BID) | OPHTHALMIC | Status: DC
  Administered 2011-09-23 – 2011-10-11 (×36): 1 [drp] via OPHTHALMIC
  Filled 2011-09-23 (×38): qty 1

## 2011-09-23 MED ORDER — PIPERACILLIN-TAZOBACTAM 3.375 G IVPB 30 MIN
3.3750 g | Freq: Once | INTRAVENOUS | Status: AC
  Administered 2011-09-23: 3.375 g via INTRAVENOUS
  Filled 2011-09-23: qty 50

## 2011-09-23 MED ORDER — BIOTENE DRY MOUTH MT LIQD
15.0000 mL | Freq: Two times a day (BID) | OROMUCOSAL | Status: DC
  Administered 2011-09-23 – 2011-10-11 (×34): 15 mL via OROMUCOSAL

## 2011-09-23 MED ORDER — PIPERACILLIN-TAZOBACTAM IN DEX 2-0.25 GM/50ML IV SOLN
2.2500 g | Freq: Four times a day (QID) | INTRAVENOUS | Status: DC
  Administered 2011-09-24: 2.25 g via INTRAVENOUS
  Filled 2011-09-23 (×4): qty 50

## 2011-09-23 MED ORDER — ENOXAPARIN SODIUM 30 MG/0.3ML ~~LOC~~ SOLN
30.0000 mg | SUBCUTANEOUS | Status: DC
  Administered 2011-09-23: 30 mg via SUBCUTANEOUS
  Filled 2011-09-23 (×2): qty 0.3

## 2011-09-23 MED ORDER — POLYVINYL ALCOHOL 1.4 % OP SOLN
1.0000 [drp] | Freq: Three times a day (TID) | OPHTHALMIC | Status: DC | PRN
  Filled 2011-09-23: qty 15

## 2011-09-23 MED ORDER — NOREPINEPHRINE BITARTRATE 1 MG/ML IJ SOLN
2.0000 ug/min | INTRAVENOUS | Status: DC
  Filled 2011-09-23 (×2): qty 16

## 2011-09-23 MED ORDER — DOBUTAMINE IN D5W 4-5 MG/ML-% IV SOLN
2.5000 ug/kg/min | INTRAVENOUS | Status: DC | PRN
Start: 2011-09-23 — End: 2011-09-25
  Filled 2011-09-23: qty 250

## 2011-09-23 MED ORDER — SODIUM CHLORIDE 0.9 % IV BOLUS (SEPSIS): 500.0000 mL | INTRAVENOUS | Status: DC | PRN

## 2011-09-23 NOTE — ED Provider Notes (Signed)
Medical screening examination/treatment/procedure(s) were conducted as a shared visit with non-physician practitioner(s) and myself.  I personally evaluated the patient during the encounter  Doug Sou, MD 09/23/11 [phone]

## 2011-09-23 NOTE — ED Notes (Signed)
Patient C/O having diarrhea since Tuesday AM. Denies diarrhea today. Also C/O having dry heaves that began a the same time the diarrhea began. Denies having any symptoms today.  C/O his shoulders, arms and back hurting.

## 2011-09-23 NOTE — Progress Notes (Signed)
Report called to Jonny Ruiz, RN on 2100.

## 2011-09-23 NOTE — Progress Notes (Signed)
Patient transferred via stretcher from ED.  Oxygen saturation 88% on 2L on arrival, crackles heard bilateral lung bases, Dr. Arbutus Leas here and notified.  Oxygen increased to 5L [place] now sating 94%.  Pt unable to void.  Bladder scan checked 612cc urine found in bladder.  Pt unable to urinate, states elarged prostate that makes it hard for him at times, states he is in some discomfort to void.  Dr. Arbutus Leas notified, order to place foley.  Foley placed after pericare under sterile technique with 2 nurses present.  Vancomycin and Zosyn antibiotics started as soon as arrived from pharmacy per order.

## 2011-09-23 NOTE — ED Provider Notes (Signed)
History     CSN: [id number]  Arrival date & time 09/23/11  1115   First MD Initiated Contact with Patient 09/23/11 1327      Chief Complaint  Patient presents with  . Diarrhea    (Consider location/radiation/quality/duration/timing/severity/associated sxs/prior treatment) HPI Comments: 78 y/o male presents with diarrhea occuring on Tuesday and Wednesday of this week. Admits to 4 episodes total. Has not had a bowel movement since. States he feels like "crap". Admits to weakness, fatigue, and decreased appetite. Forced himself to eat applesauce this morning. He was nauseated Tuesday and Wednesday with dry heaves but this has subsided. Admits to abdominal pain but states he always has this from reflux. Denies any blood in stool, headache, lightheadedness, dizziness, fever, chills, chest pain, sob, dysuria, increased frequency, difficulty urinating, confusion, alcohol use.  Patient is a 78 y.o. male presenting with diarrhea. The history is provided by the patient.  Diarrhea The primary symptoms include fatigue, abdominal pain and diarrhea. Primary symptoms do not include fever, vomiting or dysuria. Nausea: subsided.  The illness does not include chills.    Past Medical History  Diagnosis Date  . Chronic back pain     "top of neck to lower back"  . Ischemic cardiomyopathy     WITH CHF  . CHF (congestive heart failure)     EF 35-40% s/p most recent ICD generator change-out with Medtronic dual-chamber ICD 05/20/11 with explantation of previous abdominally-implanted device  . Hypertension   . Dyslipidemia   . Erythrocytosis   . GERD (gastroesophageal reflux disease)   . Abnormal thyroid scan     Abnormal thyroid imaging studies from 11/09/2010, status post ultrasound guided fine needle aspiration of the dominant left inferior thyroid nodule on 12/15/2010. Cytology report showed rare follicular epithelial cells and hemosiderin laden macrophages.  . Coronary artery disease     s/p CABG  1983 and PCI/stent 2004.   Marland Kitchen Headache   . Atrial fibrillation     on chronic Coumadin  . VT (ventricular tachycardia)   . ICD (implantable cardiac defibrillator) in place   . Atrial fibrillation   . Heart murmur   . Anginal pain   . Myocardial infarction 1983; ~ 1990  . Shortness of breath     "once in awhile when I'm relaxing"  . Asthma   . Diabetes mellitus     diet controlled  . Arthritis     "all over"  . Family history of anesthesia complication     Past Surgical History  Procedure Date  . Cardiac defibrillator placement     replaced April, 2013  . Coronary artery bypass graft 1983  . Cholecystectomy   . Esophagogastroduodenoscopy 02/11/2011    Procedure: ESOPHAGOGASTRODUODENOSCOPY (EGD);  Surgeon: Theda Belfast, MD;  Location: [facility];  Service: Endoscopy;  Laterality: N/A;  . Colonoscopy 07/07/2011    Procedure: COLONOSCOPY;  Surgeon: Beverley Fiedler, MD;  Location: [facility];  Service: Gastroenterology;  Laterality: N/A;  . Tonsillectomy     "done when I was a kid"  . Coronary angioplasty with stent placement 10/17/06    "first and only"  . Knee arthroscopy     right; "just went in and scraped it"    Family History  Problem Relation Age of Onset  . Heart disease Brother   . Diabetes Sister   . Diabetes Brother   . Tuberculosis Mother   . Tuberculosis Father   . Clotting disorder Brother     History  Substance Use Topics  .  Smoking status: Former Smoker -- 2.0 packs/day for 30 years    Types: Cigarettes    Quit date: 06/23/1976  . Smokeless tobacco: Never Used  . Alcohol Use: Yes     08/17/11 "used to drink a little bit; last drink was 4-5 years ago"      Review of Systems  Constitutional: Positive for appetite change and fatigue. Negative for fever and chills.  Respiratory: Negative for shortness of breath.   Cardiovascular: Negative for chest pain.  Gastrointestinal: Positive for abdominal pain and diarrhea. Negative for vomiting and blood in  stool. Nausea: subsided.  Genitourinary: Negative for dysuria, frequency and difficulty urinating.  Neurological: Positive for weakness. Negative for dizziness, light-headedness and headaches.  Psychiatric/Behavioral: Negative for confusion.    Allergies  Celebrex; Digoxin; Esomeprazole magnesium; and Multaq  Home Medications     BP 85/39  Pulse 61  Temp 98.2 F (36.8 C) (Oral)  Resp 21  SpO2 97%  Physical Exam  Nursing note and vitals reviewed. Constitutional: He is oriented to person, place, and time.       Appears weak  HENT:  Head: Normocephalic and atraumatic.  Mouth/Throat: Mucous membranes are dry.  Eyes: Conjunctivae and EOM are normal. Pupils are equal, round, and reactive to light.  Neck: Neck supple.  Cardiovascular: Normal rate, regular rhythm, normal heart sounds and intact distal pulses.   Pulses:      Radial pulses are 2+ on the right side, and 2+ on the left side.       Dorsalis pedis pulses are 2+ on the right side, and 2+ on the left side.       Posterior tibial pulses are 2+ on the right side, and 2+ on the left side.       Capillary refill < 3 seconds.  Pulmonary/Chest: Effort normal and breath sounds normal. He has no decreased breath sounds. He has no wheezes. He has no rhonchi. He has no rales.  Abdominal: Soft. Bowel sounds are normal. There is tenderness in the right lower quadrant and epigastric area. There is no rigidity and no rebound.  Neurological: He is alert and oriented to person, place, and time.  Skin: Skin is warm and dry. No cyanosis.       Tenting present with turgor  Psychiatric: He has a normal mood and affect. His speech is normal and behavior is normal.    ED Course  Procedures (including critical care time)   Labs Reviewed  CBC WITH DIFFERENTIAL  COMPREHENSIVE METABOLIC PANEL  LIPASE, BLOOD  URINALYSIS, ROUTINE W REFLEX MICROSCOPIC   Dg Abd Acute W/chest  09/23/2011  *RADIOLOGY REPORT*  Clinical Data: Weakness, diarrhea,  history hypertension, CHF, coronary disease post MI and CABG, asthma, diabetes  ACUTE ABDOMEN SERIES (ABDOMEN 2 VIEW & CHEST 1 VIEW)  Comparison: 08/17/2011  Findings: Left subclavian transvenous AICD leads project over right atrium and right ventricle. Epicardial pacing wires and defibrillator leads present. Upper normal heart size post median sternotomy. Atherosclerotic calcification of a tortuous thoracic aorta. Right upper lobe infiltrate consistent with pneumonia. Remaining lungs clear. Bones demineralized.  Surgical clips right upper quadrant question cholecystectomy. Nonobstructive bowel gas pattern. No bowel dilatation, bowel wall thickening, or free intraperitoneal air. Calcifications project over the kidneys bilaterally question renal calculi. Bilateral pelvic phleboliths and atherosclerotic calcifications. Degenerative changes lumbar spine and bilateral hip joints.  IMPRESSION: Nonobstructive bowel gas pattern. Question bilateral renal calculi. Right upper lobe pneumonia. Due to the more focal parenchymal opacity at the right apex, follow-  up chest radiographs until resolution recommended to exclude underlying abnormalities including tumor.   Original Report Authenticated By: Lollie Marrow, M.D.     Date: 09/23/2011  Rate: 61  Rhythm: atrial flutter  QRS Axis: left  Intervals: normal  ST/T Wave abnormalities: normal  Conduction Disutrbances:nonspecific intraventricular conduction delay  Narrative Interpretation: no stemi  Old EKG Reviewed: unchanged    No diagnosis found.    MDM  78 y/o male with 4 episodes of diarrhea, weakness, and fatigue. He is hypotensive. Positive tenderness in RLQ and mid-epigastric region on abdominal exam. Plan to stabilize blood pressure with fluids and obtain labs. Possible CT scan of abdomen. 2:57 PM Acute abdominal series obtained instead of CT after being assessed by Dr. Ethelda Chick. Patient has elevated white count. Awaiting cmp, lipase. EKG without  changes from July 2013. Will admit. Case discussed with Dr. Ethelda Chick who also evaluated patient and agrees with plan of care. 3:43 PM Acute abdominal series showing right upper lobe pneumonia. Denies any cough. Lungs CTA A&P B/L. Vanco and zosyn started.        Trevor Mace, PA-C 09/23/11 1545

## 2011-09-23 NOTE — Progress Notes (Signed)
BP 87/36 after pt received 500cc NS bolus, sats decreased to 88%, pt coughing frequently.  Increased oxygen to 6L [place], Dr. Arbutus Leas notified.  Order for stat ABG entered and CCM to consult.  Report being given to [facility] night shift.

## 2011-09-23 NOTE — ED Notes (Signed)
Pt given oral contrast for CT scan

## 2011-09-23 NOTE — ED Notes (Signed)
Patient unable to void at this time
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
## 2011-09-23 NOTE — ED Notes (Signed)
Patient C/o diarrhea for 2 days. Also Nausea and vomiting.  No emesis just dry heaves.  He also C/O headache and dizziness.  Dizziness does not change with position change.  Denies pain.  Has difficulty urinating due to enlarged prostate.

## 2011-09-23 NOTE — ED Notes (Signed)
Admitting MD at bedside.

## 2011-09-23 NOTE — Progress Notes (Addendum)
Disposition Note  [name], is a 78 y.o. male,   MRN: [id number]  -  DOB - 1932/08/25  Outpatient Primary MD for the patient is Hoyle Sauer, MD   Blood pressure 104/52, pulse 78, temperature 98.2 F (36.8 C), temperature source Oral, resp. rate 16, SpO2 96.00%.  Principal Problem:  *SIRS (systemic inflammatory response syndrome) Active Problems:  Dehydration  Hypotension  HCAP (healthcare-associated pneumonia)  Thrombocytopenia  Chronic atrial fibrillation  Chronic systolic heart failure- EF 35-40%  CAD (coronary artery disease)  Chronic anticoagulation  Anemia, iron deficiency  Weakness generalized  Diarrhea  VENTRICULAR TACHYCARDIA  AUTOMATIC IMPLANTABLE CARDIAC DEFIBRILLATOR SITU  Bradycardia  Ataxia   Older gentleman that was recently discharged from hospital on 08/20/2011 after being admitted with symptomatic dehydration. He returns to the emergency room today with complaints of generalized weakness and myalgia. Also anorexia. He's been experiencing diarrhea for 2 days which apparently has resolved. There were some complaints of epigastric tenderness. Since arrival to the ER the patient has developed nausea with dry heaves. His blood pressure has been very soft with readings as low as 83 systolic. He is subsequently in the process of receiving his third liter of IV fluids and so far blood pressure has rebounded to 104 systolic. Laboratory data revealed acute renal failure, leukocytosis and thrombocytopenia. A chest x-ray reveals right upper lobe infiltrate which is new. I reviewed these films and a definite right upper lobe infiltrate which is new is seen. In addition there are what appears to be interstitial lung changes in the mid lung as well. Because of the epigastric discomfort and patient presenting with diarrhea the emergency room physician has ordered a CT of the abdomen without contrast. He is also ordered a lactic acid. He is initiated IV vancomycin and Zosyn  to cover for presumed healthcare acquired pneumonia. I examined the patient as well. He endorses to me he was also experiencing shortness of breath at home prior to admission. He endorses right sided back pain which appears to be arrhythmic in nature and touch his respiratory effort. He is alert and continues to complain of nausea. He is thirsty and wants liquids. On exam he has crackles in the bilateral bases greater on the right with crackles going up throughout the entire right lung field. He is now on nasal cannula oxygen at 2 L per minute. Abdomen is distended but soft and non-tympanitic with hypoactive bowel sounds and there is epigastric tenderness but without guarding or rebounding. His hands and feet are cool to the touch and he appears pale. I have discussed this case at length with Dr. Rennis Chris the ER physician. He will continue high rate IV fluids at this point given our concerns the patient is very volume depleted and would benefit from further volume resuscitation. In addition we agree that a step down unit bed is appropriate. He will place temporary admission orders. I have notified the flow manager's office of that request. I also spoke with Dr. Arbutus Leas who will be the admitting physician and gave him extensive report regarding this patient and he is on his way to examine the patient.  Junious Silk, ANP

## 2011-09-23 NOTE — ED Notes (Signed)
[name]  Gave me verbal order to ekg on this patient.

## 2011-09-23 NOTE — ED Notes (Signed)
Report received, assumed care.  

## 2011-09-23 NOTE — ED Notes (Signed)
rn-monica advised on patients low blood pressure.

## 2011-09-23 NOTE — Progress Notes (Signed)
ANTIBIOTIC CONSULT NOTE - INITIAL  Pharmacy Consult for Vancomycin Indication: rule out sepsis and and HCAP coverage  Allergies  Allergen Reactions  . Celebrex (Celecoxib) Hives and Other (See Comments)    Gi upset  . Digoxin Other (See Comments)    Unknown   . Esomeprazole Magnesium Hives    "don't really remember"  . Multaq (Dronedarone Hydrochloride) Other (See Comments)    "don't remember"    Patient Measurements: Height: 6' (182.9 cm) Weight: 186 lb 4.6 oz (84.5 kg) IBW/kg (Calculated) : 77.6   Vital Signs: Temp: 99.2 F (37.3 C) (08/23 1805) Temp src: Oral (08/23 1805) BP: 105/47 mmHg (08/23 1805) Pulse Rate: 83  (08/23 1805) Intake/Output from previous day:  Labs:  Basename 09/23/11 1400  WBC 17.8*  HGB 11.5*  PLT 148*  LABCREA --  CREATININE 2.63*   Estimated Creatinine Clearance: 26.2 ml/min (by C-G formula based on Cr of 2.63).  Microbiology:   C diff PCR and blood and urine cultures ordered  Medical History: Past Medical History  Diagnosis Date  . Chronic back pain     "top of neck to lower back"  . Ischemic cardiomyopathy     WITH CHF  . CHF (congestive heart failure)     EF 35-40% s/p most recent ICD generator change-out with Medtronic dual-chamber ICD 05/20/11 with explantation of previous abdominally-implanted device  . Hypertension   . Dyslipidemia   . Erythrocytosis   . GERD (gastroesophageal reflux disease)   . Abnormal thyroid scan     Abnormal thyroid imaging studies from 11/09/2010, status post ultrasound guided fine needle aspiration of the dominant left inferior thyroid nodule on 12/15/2010. Cytology report showed rare follicular epithelial cells and hemosiderin laden macrophages.  . Coronary artery disease     s/p CABG 1983 and PCI/stent 2004.   Marland Kitchen Headache   . Atrial fibrillation     on chronic Coumadin  . VT (ventricular tachycardia)   . ICD (implantable cardiac defibrillator) in place   . Atrial fibrillation   . Heart murmur    . Anginal pain   . Myocardial infarction 1983; ~ 1990  . Shortness of breath     "once in awhile when I'm relaxing"  . Asthma   . Diabetes mellitus     diet controlled  . Arthritis     "all over"  . Family history of anesthesia complication    Assessment:   78 yr old man beginning antibiotics for HCAP and sepsis coverage.  Zosyn 3.375 grams IV & Vancomycin 1 gram IV to be given now.   Zosyn to continue with 2.25 grams IV q6hrs;  Rx to dose Vancomycin.   Acute renal failure, Scr up to 2.63, was 0.93 on 08/19/11, during recent admission.  Hydrating.  Goal of Therapy:  Vancomycin trough level 15-20 mcg/ml  Plan:  Vancomycin with 1 gram IV q24hrs for now.  Will watch renal status for need to modify regimen. Zosyn 2.25 grams IV q6hr to begin after initial dose of 3.375 grams IV.  Could change to Zosyn 3.375 grams IV q8hrs (each dose infused over 4 hours) when renal function improves & crcl >30 ml/min. Will follow renal function, culture data and clinical status.  Dennie Fetters, [place] Pager: [phone] 09/23/2011,6:20 PM

## 2011-09-23 NOTE — ED Provider Notes (Signed)
Complains of generalized weakness, diffuse body aches. Patient had diarrhea for 2 days this week. Diarrhea has resolved  Complains of nausea had dry heaves this morning. Diminished appetite for the past several days. On exam alert Glasgow Coma Score 15 extremities dry lungs clear auscultation abdomen nondistended nontender  4:05 PM patient complains of nausea and worsening of abdominal pain unreasonably H. and he is alert abdomen minimally tender at epigastrium. Spoke with [facility] plan admit step down unit. In light of infiltrate on x-ray we'll treat for healthcare associated pneumonia given weakness and leukocytosis Diagnosis #1 sepsis #2 dehydration with renal insufficiency 3 healthcare associated pneumonia CRITICAL CARE Performed by: Doug Sou   Total critical care time: 30 minute  Critical care time was exclusive of separately billable procedures and treating other patients.  Critical care was necessary to treat or prevent imminent or life-threatening deterioration.  Critical care was time spent personally by me on the following activities: development of treatment plan with patient and/or surrogate as well as nursing, discussions with consultants, evaluation of patient's response to treatment, examination of patient, obtaining history from patient or surrogate, ordering and performing treatments and interventions, ordering and review of laboratory studies, ordering and review of radiographic studies, pulse oximetry and re-evaluation of patient's condition.  Doug Sou, MD 09/23/11 [phone]
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
## 2011-09-23 NOTE — ED Notes (Signed)
Patient has gone to xray.  Will get urine sample on his return.

## 2011-09-23 NOTE — Progress Notes (Signed)
@   2056 Patient transported to 2105 via bed, O2 via [place] at 6 liters, and portable monitor, accompanied by 2, 2600 RNs. Jonny Ruiz, RN present on patient arrival. Patient placed on tele monitor in 2105, patient alert and oriented. Patient in no respiratory distress.  Jonny Ruiz, RN assumed care.

## 2011-09-24 DIAGNOSIS — I5022 Chronic systolic (congestive) heart failure: Secondary | ICD-10-CM

## 2011-09-24 DIAGNOSIS — J189 Pneumonia, unspecified organism: Secondary | ICD-10-CM

## 2011-09-24 DIAGNOSIS — A419 Sepsis, unspecified organism: Secondary | ICD-10-CM

## 2011-09-24 DIAGNOSIS — R6510 Systemic inflammatory response syndrome (SIRS) of non-infectious origin without acute organ dysfunction: Secondary | ICD-10-CM

## 2011-09-24 DIAGNOSIS — I959 Hypotension, unspecified: Secondary | ICD-10-CM

## 2011-09-24 LAB — CBC
HCT: 34.4 % — ABNORMAL LOW (ref 39.0–52.0)
MCV: 86.9 fL (ref 78.0–100.0)
RDW: 16.4 % — ABNORMAL HIGH (ref 11.5–15.5)
WBC: 30.4 10*3/uL — ABNORMAL HIGH (ref 4.0–10.5)

## 2011-09-24 LAB — COMPREHENSIVE METABOLIC PANEL
ALT: 30 U/L (ref 0–53)
Alkaline Phosphatase: 68 U/L (ref 39–117)
CO2: 19 mEq/L (ref 19–32)
Chloride: 103 mEq/L (ref 96–112)
GFR calc Af Amer: 44 mL/min — ABNORMAL LOW (ref >90)
GFR calc non Af Amer: 38 mL/min — ABNORMAL LOW (ref >90)
Glucose, Bld: 121 mg/dL — ABNORMAL HIGH (ref 70–99)
Potassium: 3.6 mEq/L (ref 3.5–5.1)
Sodium: 135 mEq/L (ref 135–145)
Total Protein: 5.2 g/dL — ABNORMAL LOW (ref 6.0–8.3)

## 2011-09-24 LAB — CARBOXYHEMOGLOBIN
Carboxyhemoglobin: 1.2 % (ref 0.5–1.5)
Methemoglobin: 1.1 % (ref 0.0–1.5)
O2 Saturation: 67.6 %

## 2011-09-24 LAB — GLUCOSE, CAPILLARY: Glucose-Capillary: 164 mg/dL — ABNORMAL HIGH (ref 70–99)

## 2011-09-24 MED ORDER — SODIUM CHLORIDE 0.9 % IV SOLN
INTRAVENOUS | Status: DC
  Administered 2011-09-24 – 2011-09-29 (×4): via INTRAVENOUS

## 2011-09-24 MED ORDER — PIPERACILLIN-TAZOBACTAM 3.375 G IVPB
3.3750 g | Freq: Three times a day (TID) | INTRAVENOUS | Status: DC
  Administered 2011-09-24 – 2011-09-28 (×13): 3.375 g via INTRAVENOUS
  Filled 2011-09-24 (×15): qty 50

## 2011-09-24 MED ORDER — PHENYLEPHRINE HCL 10 MG/ML IJ SOLN
30.0000 ug/min | INTRAVENOUS | Status: DC
  Administered 2011-09-24: 150 ug/min via INTRAVENOUS
  Administered 2011-09-24: 100 ug/min via INTRAVENOUS
  Filled 2011-09-24 (×3): qty 4

## 2011-09-24 MED ORDER — WHITE PETROLATUM GEL
Status: AC
  Administered 2011-09-24: 08:00:00
  Filled 2011-09-24: qty 5

## 2011-09-24 MED ORDER — TRAMADOL HCL 50 MG PO TABS
25.0000 mg | ORAL_TABLET | Freq: Four times a day (QID) | ORAL | Status: DC | PRN
  Administered 2011-09-25 – 2011-10-11 (×15): 25 mg via ORAL
  Filled 2011-09-24 (×16): qty 1

## 2011-09-24 MED ORDER — DOCUSATE SODIUM 100 MG PO CAPS
100.0000 mg | ORAL_CAPSULE | Freq: Two times a day (BID) | ORAL | Status: DC
  Administered 2011-09-27: 100 mg via ORAL
  Filled 2011-09-24 (×8): qty 1

## 2011-09-24 MED ORDER — ALUM & MAG HYDROXIDE-SIMETH 200-200-20 MG/5ML PO SUSP
15.0000 mL | ORAL | Status: DC | PRN
  Administered 2011-09-24 – 2011-10-01 (×7): 15 mL via ORAL
  Filled 2011-09-24 (×6): qty 30

## 2011-09-24 MED ORDER — TRAMADOL 5 MG/ML ORAL SUSPENSION: 25.0000 mg | Freq: Four times a day (QID) | ORAL | Status: DC | PRN

## 2011-09-24 NOTE — Procedures (Signed)
Arterial Catheter Insertion Procedure Note [name] [id number] 16-Mar-1932  Procedure: Insertion of Arterial Catheter  Indications: Blood pressure monitoring  Procedure Details Consent: Risks of procedure as well as the alternatives and risks of each were explained to the (patient/caregiver).  Consent for procedure obtained. Time Out: Verified patient identification, verified procedure, site/side was marked, verified correct patient position, special equipment/implants available, medications/allergies/relevent history reviewed, required imaging and test results available.  Performed  Maximum sterile technique was used including antiseptics, cap, gloves, gown, hand hygiene, mask and sheet. Skin prep: Chlorhexidine; local anesthetic administered 20 gauge catheter was inserted into right radial artery using the Seldinger technique.  Evaluation Blood flow good; BP tracing good. Complications: No apparent complications.   [name] 09/24/2011

## 2011-09-24 NOTE — Progress Notes (Signed)
Reviewed cxr post cvc placement. Results states that position cannot be confirmed. Elink MD in box called and has confirmed placement is is ok to use   Alberteen Spindle

## 2011-09-24 NOTE — Progress Notes (Signed)
Multiple attempts to reposition arterial line. There is significant difference between cuff reading and arterial line reading (when accurate). Obtained an arm board but patient will not keep his wrist in the desired position, he says it is uncomfortable. He also stated he would not allow [place] to insert another art line. Dr. Herma Carson notified, explained differences in cuff vs art line and that at this time neo gtt is off. He said at this time to hold art line for accurate reading at vital sign intervals and as needed.

## 2011-09-24 NOTE — Progress Notes (Signed)
Name: [name] MRN: [id number] DOB: 1932/07/22    LOS: 1  PULMONARY / CRITICAL CARE MEDICINE  HPI:   78 year old male with complex PMH including CHF with 35 to 40% LVEF, CAD s/p CABG, s/p ICD, HTN, A.fib not on coumadin because of history of subdural hematomas, diet controlled DM. Was recently in the hospital with subdural hematomas (July 2013). CT scan of the chest from July 2013 done to rule out malignancy showed a moderate size right pleural effusion that is resolved. Admitted today to the stepdown unit with diagnosis of RUL HCAP. Started on Zosyn and vancomycin. CCM called for persistent hypotension despite 4 L of IVF and for slightly worse hypoxemia. (92% on 5 L [place]) ABG:  7.39/28/65/17/93%. Denies SOB at rest, mild productive cough. No fever or chills. Had some right sided chest and abdominal pain but is now resolved. No urinary symptoms. No LE edema. Significant GERD symptoms at home. Occasional choking on food.   Events Since Admission: Persistent hypotension despite 4.5 L of IVF. Mental status is normal. Decent urine output.  Current Status:  Vital Signs: Temp:  [97.4 F (36.3 C)-99.2 F (37.3 C)] 97.9 F (36.6 C) (08/24 0400) Pulse Rate:  [61-85] 65  (08/24 0600) Resp:  [15-29] 21  (08/24 0600) BP: (80-121)/(34-84) 107/50 mmHg (08/24 0600) SpO2:  [87 %-97 %] 95 % (08/24 0600) Arterial Line BP: (135-168)/(46-63) 145/50 mmHg (08/24 0700) Weight:  [84.5 kg (186 lb 4.6 oz)] 84.5 kg (186 lb 4.6 oz) (08/23 1805)  Physical Examination: General:  Awake, alert. No acute distress. Neuro:  Awake, alert, oriented x 3, nonfocal HEENT:  PERRL, pink conjunctivae, moist membranes Neck:  Supple, no JVD   Cardiovascular:  RRR, no M/R/G Lungs:  Bilateral diminished air entry, no W/R/R Abdomen:  Soft, nontender, nondistended, bowel sounds present Musculoskeletal:  Moves all extremities, no pedal edema Skin:  No rash  Principal Problem:  *SIRS (systemic inflammatory response  syndrome) Active Problems:  VENTRICULAR TACHYCARDIA  Chronic atrial fibrillation  Chronic systolic heart failure- EF 35-40%  AUTOMATIC IMPLANTABLE CARDIAC DEFIBRILLATOR SITU  CAD (coronary artery disease)  Chronic anticoagulation  Bradycardia  Anemia, iron deficiency  Ataxia  Dehydration  Hypotension  HCAP (healthcare-associated pneumonia)  Thrombocytopenia  Weakness generalized  Diarrhea   ASSESSMENT AND PLAN  PULMONARY  Lab 09/24/11 0020  PHART --  PCO2ART --  PO2ART --  HCO3 --  O2SAT 67.6   Ventilator Settings:   CXR:  RUL pneumonia  A:  1) HCAP (RUL) 2) Septic shock P:   - KVO IVF. - Will repeat lactate. - Continue supplemental O2 via [place] - Patient ok to be intubated if needed, doubtful will be needed. - Abx per ID section  CARDIOVASCULAR  Lab 09/23/11 1543  TROPONINI --  LATICACIDVEN 2.4*  PROBNP --   ECG:  NSR Lines: Peripheral lines  A:  1) Septic shock: doubt true shock, difference between a-line and cuff is very large and patient is mentating well and making urine. P:  - We KVO IVF to avoid fluid overload in a patient with CHF. - Titrate phenylephrine, if unable to get off in the next couple of hours will place TLC. - Will continue to hold antihypertensives.  - Repeat lactate. - Continue stress dose steroids for now.  RENAL  Lab 09/24/11 0500 09/23/11 1400  NA 135 135  K 3.6 3.9  CL 103 98  CO2 19 23  BUN 39* 45*  CREATININE 1.68* 2.63*  CALCIUM 7.8* 8.4  MG -- --  PHOS -- --   Intake/Output      08/23 0701 - 08/24 0700 08/24 0701 - 08/25 0700   I.V. (mL/kg) 4556.3 (53.9)    IV Piggyback 1000    Total Intake(mL/kg) 5556.3 (65.8)    Urine (mL/kg/hr) 250 (0.1)    Total Output 250    Net +5306.3           Intake/Output Summary (Last 24 hours) at 09/24/11 0754 Last data filed at 09/24/11 0600  Gross per 24 hour  Intake 5906.28 ml  Output    250 ml  Net 5656.28 ml   Foley:  Placed today (09/23/11)  A:   1) Acute renal  failure (baseline creatinine is 0.95). Likely prerenal.  P:   - KVO IVF's - Repeat BMP.  GASTROINTESTINAL  Lab 09/24/11 0500 09/23/11 1400  AST 53* 42*  ALT 30 34  ALKPHOS 68 72  BILITOT 0.8 0.8  PROT 5.2* 5.7*  ALBUMIN 2.2* 2.6*    A:  1) Abdominal pain and diarrhea. P:   1) Follow up C. Diff PCR  HEMATOLOGIC  Lab 09/24/11 0500 09/23/11 1803 09/23/11 1400  HGB 11.6* 12.3* 11.5*  HCT 34.4* 37.2* 35.4*  PLT 178 161 148*  INR 1.76* -- --  APTT 47* -- --   A:  1) No issues P:  - Will follow CBC in am. - D/C lovenox (renal issues) and place on SCD's (history of subdural hematoma).  INFECTIOUS  Lab 09/24/11 0500 09/23/11 1803 09/23/11 1400  WBC 30.4* 20.0* 17.8*  PROCALCITON -- -- --   Cultures: 1) Blood and urine cultures sent. Antibiotics: 1) Zosyn (09/23/11) 2) Vancomycin (09/23/11)  A:   1) Sepsis secondary to HCAP P:   1) Antibiotics and supportive therapy as mentioned above. 2) Sputum culture sent and pending.  ENDOCRINE  Lab 09/24/11 0403 09/24/11 0030  GLUCAP 111* 108*   A:   1) Diet controlled DM P:   - Will continue stress dose steroids. -  ICU hyperglycemia protocol.  NEUROLOGIC A:   1) No issues, awake, alert, non focal.  Will hold in ICU until off pressors, doubt serious septic shock.  The patient is critically ill with multiple organ systems failure and requires high complexity decision making for assessment and support, frequent evaluation and titration of therapies, application of advanced monitoring technologies and extensive interpretation of multiple databases.   Critical Care Time devoted to patient care services described in this note is: 35 min.  Alyson Reedy, M.D. [facility] Pulmonary/Critical Care Medicine. Pager: [phone]. After hours pager: [phone].  09/24/2011, 7:47 AM

## 2011-09-24 NOTE — Progress Notes (Signed)
ANTIBIOTIC CONSULT NOTE - FOLLOW UP  Pharmacy Consult for Vancomycin and Zosyn Indication: pneumonia  Allergies  Allergen Reactions  . Celebrex (Celecoxib) Hives and Other (See Comments)    Gi upset  . Digoxin Other (See Comments)    Unknown   . Esomeprazole Magnesium Hives    "don't really remember"  . Multaq (Dronedarone Hydrochloride) Other (See Comments)    "don't remember"    Patient Measurements: Height: 6' (182.9 cm) Weight: 186 lb 4.6 oz (84.5 kg) IBW/kg (Calculated) : 77.6   Vital Signs: Temp: 97.9 F (36.6 C) (08/24 0400) BP: 90/44 mmHg (08/24 1000) Pulse Rate: 64  (08/24 1000) Intake/Output from previous day: 08/23 0701 - 08/24 0700 In: 5906.3 [I.V.:4556.3; IV Piggyback:1000] Out: 250 [Urine:250] Intake/Output from this shift: Total I/O In: 67.5 [I.V.:67.5] Out: 350 [Urine:350]  Labs:  Basename 09/24/11 0500 09/23/11 1803 09/23/11 1400  WBC 30.4* 20.0* 17.8*  HGB 11.6* 12.3* 11.5*  PLT 178 161 148*  LABCREA -- -- --  CREATININE 1.68* -- 2.63*   Estimated Creatinine Clearance: 41.1 ml/min (by C-G formula based on Cr of 1.68). No results found for this basename: VANCOTROUGH:2,VANCOPEAK:2,VANCORANDOM:2,GENTTROUGH:2,GENTPEAK:2,GENTRANDOM:2,TOBRATROUGH:2,TOBRAPEAK:2,TOBRARND:2,AMIKACINPEAK:2,AMIKACINTROU:2,AMIKACIN:2, in the last 72 hours   Microbiology: Recent Results (from the past 720 hour(s))  CULTURE, BLOOD (ROUTINE X 2)     Status: Normal (Preliminary result)   Collection Time   09/23/11  4:40 PM      Component Value Range Status Comment   Specimen Description BLOOD HAND LEFT   Final    Special Requests BOTTLES DRAWN AEROBIC ONLY 10CC   Final    Culture  Setup Time 09/23/2011 21:08   Final    Culture     Final    Value:        BLOOD CULTURE RECEIVED NO GROWTH TO DATE CULTURE WILL BE HELD FOR 5 DAYS BEFORE ISSUING A FINAL NEGATIVE REPORT   Report Status PENDING   Incomplete   CULTURE, BLOOD (ROUTINE X 2)     Status: Normal (Preliminary result)   Collection Time   09/23/11  4:50 PM      Component Value Range Status Comment   Specimen Description BLOOD HAND LEFT   Final    Special Requests BOTTLES DRAWN AEROBIC ONLY 10CC   Final    Culture  Setup Time 09/23/2011 21:08   Final    Culture     Final    Value:        BLOOD CULTURE RECEIVED NO GROWTH TO DATE CULTURE WILL BE HELD FOR 5 DAYS BEFORE ISSUING A FINAL NEGATIVE REPORT   Report Status PENDING   Incomplete   MRSA PCR SCREENING     Status: Abnormal   Collection Time   09/23/11  6:03 PM      Component Value Range Status Comment   MRSA by PCR POSITIVE (*) NEGATIVE Final   CULTURE, EXPECTORATED SPUTUM-ASSESSMENT     Status: Normal   Collection Time   09/23/11  8:39 PM      Component Value Range Status Comment   Specimen Description SPUTUM   Final    Special Requests NONE   Final    Sputum evaluation     Final    Value: THIS SPECIMEN IS ACCEPTABLE. RESPIRATORY CULTURE REPORT TO FOLLOW.   Report Status 09/23/2011 FINAL   Final     Anti-infectives     Start     Dose/Rate Route Frequency Ordered Stop   09/24/11 2000   vancomycin (VANCOCIN) IVPB 1000 mg/200 mL premix  1,000 mg 200 mL/hr over 60 Minutes Intravenous Every 24 hours 09/23/11 1818     09/23/11 2359  piperacillin-tazobactam (ZOSYN) IVPB 2.25 g       2.25 g 100 mL/hr over 30 Minutes Intravenous 4 times per day 09/23/11 1756     09/23/11 1815  piperacillin-tazobactam (ZOSYN) IVPB 3.375 g       3.375 g 100 mL/hr over 30 Minutes Intravenous  Once 09/23/11 1812 09/23/11 1848   09/23/11 1530   vancomycin (VANCOCIN) IVPB 1000 mg/200 mL premix        1,000 mg 200 mL/hr over 60 Minutes Intravenous  Once 09/23/11 1522 09/23/11 1920   09/23/11 1530   piperacillin-tazobactam (ZOSYN) IVPB 3.375 g  Status:  Discontinued        3.375 g 12.5 mL/hr over 240 Minutes Intravenous  Once 09/23/11 1522 09/23/11 1812          Assessment: HCAP:  To continue antibiotic therapy with Vancomycin and Zosyn.  His renal function  has improved and his Zosyn dose can be optimized for better pharmacodynamic target concentration attainment with extended infusion.  His Vancomycin dose is still adequate at this time.  Goal of Therapy:  Vancomycin trough level 15-20 mcg/ml  Plan:  Change Zosyn to 3.375gm IV q8h extended infusion Continue Vancomycin 1gm IV q24h Monitor renal function and urine output Follow-up microbiologic data  Estella Husk, Pharm.D., BCPS Clinical Pharmacist  Phone [phone] Pager [phone] 09/24/2011, 11:29 AM

## 2011-09-24 NOTE — Procedures (Signed)
Central Venous Catheter Insertion Procedure Note [name] [id number] Oct 26, 1932  Procedure: Insertion of Central Venous Catheter Indications: Assessment of intravascular volume, Drug and/or fluid administration and Frequent blood sampling  Procedure Details Consent: Risks of procedure as well as the alternatives and risks of each were explained to the (patient/caregiver).  Consent for procedure obtained. Time Out: Verified patient identification, verified procedure, site/side was marked, verified correct patient position, special equipment/implants available, medications/allergies/relevent history reviewed, required imaging and test results available.  Performed  Maximum sterile technique was used including antiseptics, cap, gloves, gown, hand hygiene, mask and sheet. Skin prep: Chlorhexidine; local anesthetic administered A antimicrobial bonded/coated triple lumen catheter was placed in the right subclavian vein using the Seldinger technique.  Evaluation Blood flow good Complications: No apparent complications Patient did tolerate procedure well. Chest X-ray ordered to verify placement.  CXR: normal.  [name], M.D. Pulmonary and Critical Care Medicine Call E-link with questions [phone] 09/24/2011, 2:58 AM

## 2011-09-25 DIAGNOSIS — R197 Diarrhea, unspecified: Secondary | ICD-10-CM

## 2011-09-25 LAB — BASIC METABOLIC PANEL
BUN: 32 mg/dL — ABNORMAL HIGH (ref 6–23)
CO2: 20 mEq/L (ref 19–32)
Calcium: 8.6 mg/dL (ref 8.4–10.5)
Glucose, Bld: 152 mg/dL — ABNORMAL HIGH (ref 70–99)
Sodium: 136 mEq/L (ref 135–145)

## 2011-09-25 LAB — CBC
HCT: 37.2 % — ABNORMAL LOW (ref 39.0–52.0)
Hemoglobin: 12.5 g/dL — ABNORMAL LOW (ref 13.0–17.0)
MCH: 28.7 pg (ref 26.0–34.0)
MCV: 85.5 fL (ref 78.0–100.0)
RBC: 4.35 MIL/uL (ref 4.22–5.81)

## 2011-09-25 LAB — GLUCOSE, CAPILLARY
Glucose-Capillary: 119 mg/dL — ABNORMAL HIGH (ref 70–99)
Glucose-Capillary: 125 mg/dL — ABNORMAL HIGH (ref 70–99)
Glucose-Capillary: 141 mg/dL — ABNORMAL HIGH (ref 70–99)
Glucose-Capillary: 146 mg/dL — ABNORMAL HIGH (ref 70–99)
Glucose-Capillary: 158 mg/dL — ABNORMAL HIGH (ref 70–99)
Glucose-Capillary: 164 mg/dL — ABNORMAL HIGH (ref 70–99)

## 2011-09-25 LAB — URINE CULTURE: Culture: NO GROWTH

## 2011-09-25 LAB — PHOSPHORUS: Phosphorus: 1.9 mg/dL — ABNORMAL LOW (ref 2.3–4.6)

## 2011-09-25 MED ORDER — POTASSIUM CHLORIDE CRYS ER 20 MEQ PO TBCR
40.0000 meq | EXTENDED_RELEASE_TABLET | Freq: Three times a day (TID) | ORAL | Status: AC
  Administered 2011-09-25 (×2): 40 meq via ORAL
  Filled 2011-09-25 (×2): qty 2

## 2011-09-25 MED ORDER — HYDROCORTISONE SOD SUCCINATE 100 MG IJ SOLR
50.0000 mg | Freq: Three times a day (TID) | INTRAMUSCULAR | Status: DC
  Administered 2011-09-25 – 2011-09-26 (×4): 50 mg via INTRAVENOUS
  Filled 2011-09-25 (×6): qty 1

## 2011-09-25 MED ORDER — CALCIUM CARBONATE ANTACID 500 MG PO CHEW
1.0000 | CHEWABLE_TABLET | Freq: Four times a day (QID) | ORAL | Status: DC | PRN
  Administered 2011-09-25 – 2011-10-10 (×5): 200 mg via ORAL
  Filled 2011-09-25 (×4): qty 1

## 2011-09-25 MED ORDER — MAGNESIUM SULFATE 40 MG/ML IJ SOLN
2.0000 g | Freq: Once | INTRAMUSCULAR | Status: AC
  Administered 2011-09-25: 2 g via INTRAVENOUS
  Filled 2011-09-25 (×2): qty 50

## 2011-09-25 MED ORDER — DEXLANSOPRAZOLE 60 MG PO CPDR
60.0000 mg | DELAYED_RELEASE_CAPSULE | Freq: Every day | ORAL | Status: DC
  Administered 2011-09-25 – 2011-10-02 (×8): 60 mg via ORAL
  Filled 2011-09-25 (×9): qty 1

## 2011-09-25 MED ORDER — K PHOS MONO-SOD PHOS DI & MONO 155-852-130 MG PO TABS
500.0000 mg | ORAL_TABLET | Freq: Two times a day (BID) | ORAL | Status: AC
  Administered 2011-09-25 – 2011-09-26 (×4): 500 mg via ORAL
  Filled 2011-09-25 (×4): qty 2

## 2011-09-25 MED ORDER — VANCOMYCIN HCL IN DEXTROSE 1-5 GM/200ML-% IV SOLN
1000.0000 mg | Freq: Two times a day (BID) | INTRAVENOUS | Status: DC
  Administered 2011-09-25 – 2011-09-28 (×7): 1000 mg via INTRAVENOUS
  Filled 2011-09-25 (×8): qty 200

## 2011-09-25 NOTE — Progress Notes (Signed)
Name: [name] MRN: [id number] DOB: 12-05-1932    LOS: 2  PULMONARY / CRITICAL CARE MEDICINE  HPI:   78 year old male with complex PMH including CHF with 35 to 40% LVEF, CAD s/p CABG, s/p ICD, HTN, A.fib not on coumadin because of history of subdural hematomas, diet controlled DM. Was recently in the hospital with subdural hematomas (July 2013). CT scan of the chest from July 2013 done to rule out malignancy showed a moderate size right pleural effusion that is resolved. Admitted today to the stepdown unit with diagnosis of RUL HCAP. Started on Zosyn and vancomycin. CCM called for persistent hypotension despite 4 L of IVF and for slightly worse hypoxemia. (92% on 5 L [place]) ABG:  7.39/28/65/17/93%. Denies SOB at rest, mild productive cough. No fever or chills. Had some right sided chest and abdominal pain but is now resolved. No urinary symptoms. No LE edema. Significant GERD symptoms at home. Occasional choking on food.   Events Since Admission: Persistent hypotension despite 4.5 L of IVF. Mental status is normal. Decent urine output.  Current Status:  Vital Signs: Temp:  [97.5 F (36.4 C)-98.2 F (36.8 C)] 97.9 F (36.6 C) (08/25 0739) Pulse Rate:  [62-90] 66  (08/25 0700) Resp:  [10-28] 27  (08/25 0700) BP: (78-147)/(36-80) 100/49 mmHg (08/25 0700) SpO2:  [89 %-98 %] 92 % (08/25 0700) Arterial Line BP: (89-167)/(33-125) 108/47 mmHg (08/25 0700) Weight:  [86.9 kg (191 lb 9.3 oz)] 86.9 kg (191 lb 9.3 oz) (08/25 0030)  Physical Examination: General:  Awake, alert. No acute distress. Neuro:  Awake, alert, oriented x 3, nonfocal HEENT:  PERRL, pink conjunctivae, moist membranes Neck:  Supple, no JVD   Cardiovascular:  RRR, no M/R/G Lungs:  Bilateral diminished air entry, no W/R/R Abdomen:  Soft, nontender, nondistended, bowel sounds present Musculoskeletal:  Moves all extremities, no pedal edema Skin:  No rash  Principal Problem:  *SIRS (systemic inflammatory response  syndrome) Active Problems:  VENTRICULAR TACHYCARDIA  Chronic atrial fibrillation  Chronic systolic heart failure- EF 35-40%  AUTOMATIC IMPLANTABLE CARDIAC DEFIBRILLATOR SITU  CAD (coronary artery disease)  Chronic anticoagulation  Bradycardia  Anemia, iron deficiency  Ataxia  Dehydration  Hypotension  HCAP (healthcare-associated pneumonia)  Thrombocytopenia  Weakness generalized  Diarrhea   ASSESSMENT AND PLAN  PULMONARY  Lab 09/24/11 0020  PHART --  PCO2ART --  PO2ART --  HCO3 --  O2SAT 67.6   Ventilator Settings:   CXR:  RUL pneumonia  A:  1) HCAP (RUL) 2) Septic shock P:   - KVO IVF. - Continue supplemental O2 via [place], titrate for sat of 88-92%. - Abx per ID section.  CARDIOVASCULAR  Lab 09/23/11 1543  TROPONINI --  LATICACIDVEN 2.4*  PROBNP --   ECG:  NSR Lines: Peripheral lines  A:  1) Septic shock: doubt true shock, difference between a-line and cuff is very large and patient is mentating well and making urine. P:  - D/C pressors. - Will continue to hold antihypertensives.  - Decrease stress dose steroids to 50 q8 and titrate as BP tolerates (unfortunately cortisol level was not drawn prior to giving hydrocortisone).  RENAL  Lab 09/25/11 0405 09/24/11 0500 09/23/11 1400  NA 136 135 135  K 3.4* 3.6 --  CL 103 103 98  CO2 20 19 23   BUN 32* 39* 45*  CREATININE 1.14 1.68* 2.63*  CALCIUM 8.6 7.8* 8.4  MG 1.7 -- --  PHOS 1.9* -- --   Intake/Output  08/24 0701 - 08/25 0700 08/25 0701 - 08/26 0700   P.O. 360    I.V. (mL/kg) 399.1 (4.6)    Other     IV Piggyback 567    Total Intake(mL/kg) 1326.1 (15.3)    Urine (mL/kg/hr) 1615 (0.8)    Total Output 1615    Net -289         Stool Occurrence 2 x      Intake/Output Summary (Last 24 hours) at 09/25/11 0743 Last data filed at 09/25/11 0700  Gross per 24 hour  Intake 1326.05 ml  Output   1615 ml  Net -288.95 ml   Foley:  Placed today (09/23/11)  A:   1) Acute renal failure  (baseline creatinine is 0.95). Likely prerenal.  P:   - KVO IVF's - Repeat BMP, Mg and Phos. - Replace K, Mg and Phos.  GASTROINTESTINAL  Lab 09/24/11 0500 09/23/11 1400  AST 53* 42*  ALT 30 34  ALKPHOS 68 72  BILITOT 0.8 0.8  PROT 5.2* 5.7*  ALBUMIN 2.2* 2.6*    A:  1) Abdominal pain and diarrhea. P:   1) Follow up C. Diff PCR negative.  HEMATOLOGIC  Lab 09/25/11 0405 09/24/11 0500 09/23/11 1803 09/23/11 1400  HGB 12.5* 11.6* 12.3* 11.5*  HCT 37.2* 34.4* 37.2* 35.4*  PLT 175 178 161 148*  INR -- 1.76* -- --  APTT -- 47* -- --   A:  1) No issues P:  - Will follow CBC in am. - D/C lovenox (renal issues) and place on SCD's (history of subdural hematoma).  INFECTIOUS  Lab 09/25/11 0405 09/24/11 0500 09/23/11 1803 09/23/11 1400  WBC 24.0* 30.4* 20.0* 17.8*  PROCALCITON -- -- -- --   Cultures: 1) Blood and urine cultures sent. Antibiotics: 1) Zosyn (09/23/11) 2) Vancomycin (09/23/11)  A:   1) Sepsis secondary to HCAP P:   1) Antibiotics and supportive therapy as mentioned above. 2) Sputum culture sent and pending.  ENDOCRINE  Lab 09/25/11 0716 09/25/11 0340 09/25/11 0316 09/24/11 2352 09/24/11 1929  GLUCAP 125* 140* 146* 158* 178*   A:   1) Diet controlled DM P:   - Stress dose steroids as above. - CBG hyperglycemia protocol.  NEUROLOGIC A:   1) No issues, awake, alert, non focal.  Will transfer to SDU, TRH to pick back up, PCCM will sign off, please call back in AM.  Alyson Reedy, M.D. [facility] Pulmonary/Critical Care Medicine. Pager: [phone]. After hours pager: [phone].  09/25/2011, 7:43 AM

## 2011-09-25 NOTE — Progress Notes (Signed)
[facility] ADULT ICU REPLACEMENT PROTOCOL FOR AM LAB REPLACEMENT ONLY  The patient does not apply for the [facility] Adult ICU Electrolyte Replacment Protocol based on the criteria listed below:  Does not  BUN 32 16. If a panic level lab has been reported, has the CCM MD in charge been notified? yes.   Physician:Dr Ramaswamy  Cathlean Cower [facility] 09/25/2011 4:58 AM

## 2011-09-25 NOTE — Progress Notes (Signed)
ANTIBIOTIC CONSULT NOTE - FOLLOW UP  Pharmacy Consult for Vancomycin and Zosyn Indication: pneumonia  Allergies  Allergen Reactions  . Celebrex (Celecoxib) Hives and Other (See Comments)    Gi upset  . Digoxin Other (See Comments)    Unknown   . Esomeprazole Magnesium Hives    "don't really remember"  . Multaq (Dronedarone Hydrochloride) Other (See Comments)    "don't remember"    Patient Measurements: Height: 6' (182.9 cm) Weight: 191 lb 9.3 oz (86.9 kg) IBW/kg (Calculated) : 77.6   Vital Signs: Temp: 97.9 F (36.6 C) (08/25 0739) Temp src: Oral (08/25 0739) BP: 108/67 mmHg (08/25 0900) Pulse Rate: 89  (08/25 0900) Intake/Output from previous day: 08/24 0701 - 08/25 0700 In: 1326.1 [P.O.:360; I.V.:399.1; IV Piggyback:567] Out: 1615 [Urine:1615] Intake/Output from this shift: Total I/O In: 65 [I.V.:40; IV Piggyback:25] Out: 165 [Urine:165]  Labs:  Basename 09/25/11 0405 09/24/11 0500 09/23/11 1803 09/23/11 1400  WBC 24.0* 30.4* 20.0* --  HGB 12.5* 11.6* 12.3* --  PLT 175 178 161 --  LABCREA -- -- -- --  CREATININE 1.14 1.68* -- 2.63*   Estimated Creatinine Clearance: 60.5 ml/min (by C-G formula based on Cr of 1.14). No results found for this basename: VANCOTROUGH:2,VANCOPEAK:2,VANCORANDOM:2,GENTTROUGH:2,GENTPEAK:2,GENTRANDOM:2,TOBRATROUGH:2,TOBRAPEAK:2,TOBRARND:2,AMIKACINPEAK:2,AMIKACINTROU:2,AMIKACIN:2, in the last 72 hours   Microbiology: Recent Results (from the past 720 hour(s))  CULTURE, BLOOD (ROUTINE X 2)     Status: Normal (Preliminary result)   Collection Time   09/23/11  4:40 PM      Component Value Range Status Comment   Specimen Description BLOOD HAND LEFT   Final    Special Requests BOTTLES DRAWN AEROBIC ONLY 10CC   Final    Culture  Setup Time 09/23/2011 21:08   Final    Culture     Final    Value:        BLOOD CULTURE RECEIVED NO GROWTH TO DATE CULTURE WILL BE HELD FOR 5 DAYS BEFORE ISSUING A FINAL NEGATIVE REPORT   Report Status PENDING    Incomplete   CULTURE, BLOOD (ROUTINE X 2)     Status: Normal (Preliminary result)   Collection Time   09/23/11  4:50 PM      Component Value Range Status Comment   Specimen Description BLOOD HAND LEFT   Final    Special Requests BOTTLES DRAWN AEROBIC ONLY 10CC   Final    Culture  Setup Time 09/23/2011 21:08   Final    Culture     Final    Value:        BLOOD CULTURE RECEIVED NO GROWTH TO DATE CULTURE WILL BE HELD FOR 5 DAYS BEFORE ISSUING A FINAL NEGATIVE REPORT   Report Status PENDING   Incomplete   MRSA PCR SCREENING     Status: Abnormal   Collection Time   09/23/11  6:03 PM      Component Value Range Status Comment   MRSA by PCR POSITIVE (*) NEGATIVE Final   URINE CULTURE     Status: Normal   Collection Time   09/23/11  6:39 PM      Component Value Range Status Comment   Specimen Description URINE, CATHETERIZED   Final    Special Requests NONE   Final    Culture  Setup Time 09/23/2011 21:02   Final    Colony Count NO GROWTH   Final    Culture NO GROWTH   Final    Report Status 09/25/2011 FINAL   Final   CULTURE, EXPECTORATED SPUTUM-ASSESSMENT  Status: Normal   Collection Time   09/23/11  8:39 PM      Component Value Range Status Comment   Specimen Description SPUTUM   Final    Special Requests NONE   Final    Sputum evaluation     Final    Value: THIS SPECIMEN IS ACCEPTABLE. RESPIRATORY CULTURE REPORT TO FOLLOW.   Report Status 09/23/2011 FINAL   Final   CULTURE, RESPIRATORY     Status: Normal (Preliminary result)   Collection Time   09/23/11  8:39 PM      Component Value Range Status Comment   Specimen Description SPUTUM   Final    Special Requests NONE   Final    Gram Stain PENDING   Incomplete    Culture NORMAL OROPHARYNGEAL FLORA   Final    Report Status PENDING   Incomplete   CLOSTRIDIUM DIFFICILE BY PCR     Status: Normal   Collection Time   09/24/11 11:56 AM      Component Value Range Status Comment   C difficile by pcr NEGATIVE  NEGATIVE Final      Anti-infectives     Start     Dose/Rate Route Frequency Ordered Stop   09/24/11 2000   vancomycin (VANCOCIN) IVPB 1000 mg/200 mL premix        1,000 mg 200 mL/hr over 60 Minutes Intravenous Every 24 hours 09/23/11 1818     09/24/11 1400   piperacillin-tazobactam (ZOSYN) IVPB 3.375 g        3.375 g 12.5 mL/hr over 240 Minutes Intravenous 3 times per day 09/24/11 1131     09/23/11 2359   piperacillin-tazobactam (ZOSYN) IVPB 2.25 g  Status:  Discontinued        2.25 g 100 mL/hr over 30 Minutes Intravenous 4 times per day 09/23/11 1756 09/24/11 1131   09/23/11 1815   piperacillin-tazobactam (ZOSYN) IVPB 3.375 g        3.375 g 100 mL/hr over 30 Minutes Intravenous  Once 09/23/11 1812 09/23/11 1848   09/23/11 1530   vancomycin (VANCOCIN) IVPB 1000 mg/200 mL premix        1,000 mg 200 mL/hr over 60 Minutes Intravenous  Once 09/23/11 1522 09/23/11 1920   09/23/11 1530   piperacillin-tazobactam (ZOSYN) IVPB 3.375 g  Status:  Discontinued        3.375 g 12.5 mL/hr over 240 Minutes Intravenous  Once 09/23/11 1522 09/23/11 1812          Assessment: HCAP:  To continue antibiotic therapy with Vancomycin and Zosyn.  His renal function continues to improve and his Vancomycin dose can be optimized for better pharmacodynamic target concentration attainment.  His Zosyn dose was increased yesterday and remains appropriate.  Goal of Therapy:  Vancomycin trough level 15-20 mcg/ml  Plan:  Increase Vancomycin to 1gm IV q12h Continue Zosyn to 3.375gm IV q8h extended infusion Monitor renal function and urine output Follow-up microbiologic data  Estella Husk, Pharm.D., BCPS Clinical Pharmacist  Phone [phone] Pager [phone] 09/25/2011, 10:36 AM

## 2011-09-25 NOTE — Progress Notes (Signed)
Dr. Marin Shutter made aware of O2 sats 84 -88 on 5L N/C humidified. Pt placed on 50% ventimask . Sats up to 94%

## 2011-09-26 DIAGNOSIS — R279 Unspecified lack of coordination: Secondary | ICD-10-CM

## 2011-09-26 LAB — MAGNESIUM: Magnesium: 1.9 mg/dL (ref 1.5–2.5)

## 2011-09-26 LAB — CULTURE, RESPIRATORY W GRAM STAIN: Culture: NORMAL

## 2011-09-26 LAB — CBC
Platelets: 168 10*3/uL (ref 150–400)
RDW: 17.1 % — ABNORMAL HIGH (ref 11.5–15.5)
WBC: 21 10*3/uL — ABNORMAL HIGH (ref 4.0–10.5)

## 2011-09-26 LAB — BASIC METABOLIC PANEL
Chloride: 105 mEq/L (ref 96–112)
GFR calc Af Amer: >90 mL/min (ref >90)
Potassium: 3.9 mEq/L (ref 3.5–5.1)

## 2011-09-26 LAB — POCT I-STAT 3, ART BLOOD GAS (G3+)
O2 Saturation: 93 %
Patient temperature: 98.6
TCO2: 18 mmol/L (ref 0–100)

## 2011-09-26 LAB — GLUCOSE, CAPILLARY: Glucose-Capillary: 145 mg/dL — ABNORMAL HIGH (ref 70–99)

## 2011-09-26 LAB — PHOSPHORUS: Phosphorus: 1.3 mg/dL — ABNORMAL LOW (ref 2.3–4.6)

## 2011-09-26 MED ORDER — CHLORHEXIDINE GLUCONATE CLOTH 2 % EX PADS
6.0000 | MEDICATED_PAD | Freq: Every day | CUTANEOUS | Status: AC
  Administered 2011-09-26 – 2011-09-30 (×4): 6 via TOPICAL

## 2011-09-26 MED ORDER — AMIODARONE HCL 200 MG PO TABS
200.0000 mg | ORAL_TABLET | Freq: Every day | ORAL | Status: DC
  Administered 2011-09-26 – 2011-10-11 (×16): 200 mg via ORAL
  Filled 2011-09-26 (×17): qty 1

## 2011-09-26 MED ORDER — HYDROCORTISONE SOD SUCCINATE 100 MG IJ SOLR
25.0000 mg | Freq: Three times a day (TID) | INTRAMUSCULAR | Status: DC
  Administered 2011-09-26 – 2011-09-27 (×3): 25 mg via INTRAVENOUS
  Filled 2011-09-26 (×5): qty 0.5

## 2011-09-26 MED ORDER — MUPIROCIN 2 % EX OINT
1.0000 "application " | TOPICAL_OINTMENT | Freq: Two times a day (BID) | CUTANEOUS | Status: AC
  Administered 2011-09-26 – 2011-09-30 (×10): 1 via NASAL
  Filled 2011-09-26 (×3): qty 22

## 2011-09-26 NOTE — Progress Notes (Signed)
Pt arrived from 2607. Alert and oriented. VSS. C/O chronic back pain. Plan to medicate when able to. Pt oriented to room. Pt sitting upright in chair. Call bell within reach. Instructed to call before getting out of bed. Continue to monitor.

## 2011-09-26 NOTE — Progress Notes (Signed)
[facility] Progress Note [place] TEAM 1 - Stepdown/ICU TEAM   CAMEO SHEWELL ZOX:[id number] DOB: 09/16/1932 DOA: 09/23/2011 PCP: Hoyle Sauer, MD  Brief narrative: 78 year old male patient with multiple medical problems. Recent hospital admission July 2013 because of subdural hematoma. A CT scan of the chest done during that admission to rule out any form of malignancy demonstrated a moderate right-sided pleural effusion that subsequently resolved. He presented back to the hospital on 09/23/2011 and was found to be hypotensive, volume depleted and had right upper lobe healthcare acquired pneumonia. He was aggressively volume resuscitated and initiated on Zosyn and vancomycin. Because of persistent hypotension and worsening hypoxemia pulmonary critical care medicine assumed care of the patient shortly after admission. Patient did require pressor support briefly but was rapidly weaned. Subsequently he has improved significantly and stabilized enough to transfer out of the ICU to step down on 09/25/2011. [facility] assumed care of the patient on 09/26/2011.  Assessment/Plan:  SIRS (systemic inflammatory response syndrome) *Resolved  Dehydration /Hypotension *Resolving *Continue to titrate stress dose steroids *Carvedilol, lisinopril and Lasix remain on hold  Acute hypoxic respiratory failure due to HCAP (healthcare-associated pneumonia)/leukocytosis *Continue broad-spectrum antibiotic therapy with Zosyn and vancomycin *Continue supportive care with oxygen and pulmonary toileting  Thrombocytopenia *Resolved *Likely secondary to recent low perfusion/shock  Chronic atrial fibrillation *Resume oral amiodarone *NOT a Coumadin candidate secondary to recent subdural hematoma July 2013  Chronic systolic heart failure- EF 35-40% *Currently compensated *Can resume home medications once more hemodynamically stable  CAD (coronary artery disease) *Continue aspirin  Anemia,  iron deficiency *Hemoglobin stable continue to monitor  Weakness generalized/Diarrhea *C. difficile PCR negative *Suspect related to sequelae of severe healthcare acquired pneumonia *Diarrhea has resolved  VENTRICULAR TACHYCARDIA/AUTOMATIC IMPLANTABLE CARDIAC DEFIBRILLATOR SITU  Bradycardia *Is related to chronic carvedilol therapy  Ataxia *At time of admission likely related to symptomatic hypotension and orthostasis *PT/OT evaluation  DVT prophylaxis: SCDs-no pharmacological anticoagulation due to history of recent subdural hematoma Code Status: Full Family Communication: Discussed directly with patient at bedside this morning Disposition Plan: Transfer to medical floor. Pending PT evaluation may benefit from rehabilitation stay at time of discharge  Consultants: Pulmonary critical care medicine-signed off 09/25/2011  Procedures: Right subclavian central line 09/23/2011 >>> plan discontinue 09/26/2011  Cultures:  1) Blood and urine cultures sent.  Antibiotics: 1) Zosyn (09/23/11)  2) Vancomycin (09/23/11)  HPI/Subjective: Patient is alert and seated upright in chair. Markedly improved since I examined him on 09/23/2011 in the emergency department. Currently denies shortness of breath or chest pain. Continues to have epigastric discomfort which is chronic and generally related to dietary intake. Patient relates this to his chronic reflux symptoms.   Objective: Blood pressure 112/55, pulse 68, temperature 97.9 F (36.6 C), temperature source Axillary, resp. rate 30, height 6' (1.829 m), weight 87.4 kg (192 lb 10.9 oz), SpO2 94.00%.  Intake/Output Summary (Last 24 hours) at 09/26/11 1240 Last data filed at 09/26/11 0800  Gross per 24 hour  Intake  936.6 ml  Output    400 ml  Net  536.6 ml     Exam: General: No acute respiratory distress Lungs:  Mostly Clear to auscultation bilaterally that he does have crackles in the right base radiating up to the mid field towards  the apex of the lung Cardiovascular: Regular rate and rhythm without murmur gallop or rub normal S1 and S2, IV fluid keep open through right subclavian central line Abdomen: Nontender, nondistended, soft, bowel sounds positive, no rebound, no ascites,  no appreciable mass Musculoskeletal: No significant cyanosis, clubbing, or edema bilateral lower extremities Neurological: Alert and oriented x3, moves all extremities x4, exam non focal  Data Reviewed: Basic Metabolic Panel:  Lab 09/26/11 1610 09/25/11 0405 09/24/11 0500 09/23/11 1400  NA 136 136 135 135  K 3.9 3.4* 3.6 3.9  CL 105 103 103 98  CO2 22 20 19 23   GLUCOSE 132* 152* 121* 126*  BUN 27* 32* 39* 45*  CREATININE 0.95 1.14 1.68* 2.63*  CALCIUM 8.4 8.6 7.8* 8.4  MG 1.9 1.7 -- --  PHOS 1.3* 1.9* -- --   Liver Function Tests:  Lab 09/24/11 0500 09/23/11 1400  AST 53* 42*  ALT 30 34  ALKPHOS 68 72  BILITOT 0.8 0.8  PROT 5.2* 5.7*  ALBUMIN 2.2* 2.6*    Lab 09/23/11 1400  LIPASE 8*  AMYLASE --   CBC:  Lab 09/26/11 0525 09/25/11 0405 09/24/11 0500 09/23/11 1803 09/23/11 1400  WBC 21.0* 24.0* 30.4* 20.0* 17.8*  NEUTROABS -- -- -- -- 13.7*  HGB 11.5* 12.5* 11.6* 12.3* 11.5*  HCT 34.1* 37.2* 34.4* 37.2* 35.4*  MCV 86.5 85.5 86.9 87.1 87.6  PLT 168 175 178 161 148*   BNP (last 3 results)  Basename 08/12/11 1604 11/29/10 0500 11/28/10 1338  PROBNP 422.7 1050.0* 1356.0*   CBG:  Lab 09/26/11 1152 09/26/11 0819 09/26/11 0529 09/26/11 0011 09/25/11 1957  GLUCAP 145* 120* 128* 125* 119*    Recent Results (from the past 240 hour(s))  CULTURE, BLOOD (ROUTINE X 2)     Status: Normal (Preliminary result)   Collection Time   09/23/11  4:40 PM      Component Value Range Status Comment   Specimen Description BLOOD HAND LEFT   Final    Special Requests BOTTLES DRAWN AEROBIC ONLY 10CC   Final    Culture  Setup Time 09/23/2011 21:08   Final    Culture     Final    Value:        BLOOD CULTURE RECEIVED NO GROWTH TO DATE  CULTURE WILL BE HELD FOR 5 DAYS BEFORE ISSUING A FINAL NEGATIVE REPORT   Report Status PENDING   Incomplete   CULTURE, BLOOD (ROUTINE X 2)     Status: Normal (Preliminary result)   Collection Time   09/23/11  4:50 PM      Component Value Range Status Comment   Specimen Description BLOOD HAND LEFT   Final    Special Requests BOTTLES DRAWN AEROBIC ONLY 10CC   Final    Culture  Setup Time 09/23/2011 21:08   Final    Culture     Final    Value:        BLOOD CULTURE RECEIVED NO GROWTH TO DATE CULTURE WILL BE HELD FOR 5 DAYS BEFORE ISSUING A FINAL NEGATIVE REPORT   Report Status PENDING   Incomplete   MRSA PCR SCREENING     Status: Abnormal   Collection Time   09/23/11  6:03 PM      Component Value Range Status Comment   MRSA by PCR POSITIVE (*) NEGATIVE Final   URINE CULTURE     Status: Normal   Collection Time   09/23/11  6:39 PM      Component Value Range Status Comment   Specimen Description URINE, CATHETERIZED   Final    Special Requests NONE   Final    Culture  Setup Time 09/23/2011 21:02   Final    Colony Count NO GROWTH  Final    Culture NO GROWTH   Final    Report Status 09/25/2011 FINAL   Final   CULTURE, EXPECTORATED SPUTUM-ASSESSMENT     Status: Normal   Collection Time   09/23/11  8:39 PM      Component Value Range Status Comment   Specimen Description SPUTUM   Final    Special Requests NONE   Final    Sputum evaluation     Final    Value: THIS SPECIMEN IS ACCEPTABLE. RESPIRATORY CULTURE REPORT TO FOLLOW.   Report Status 09/23/2011 FINAL   Final   CULTURE, RESPIRATORY     Status: Normal   Collection Time   09/23/11  8:39 PM      Component Value Range Status Comment   Specimen Description SPUTUM   Final    Special Requests NONE   Final    Gram Stain     Final    Value: FEW WBC PRESENT,BOTH PMN AND MONONUCLEAR     RARE SQUAMOUS EPITHELIAL CELLS PRESENT     RARE GRAM POSITIVE COCCI     IN PAIRS IN CLUSTERS RARE GRAM POSITIVE RODS   Culture NORMAL OROPHARYNGEAL FLORA    Final    Report Status 09/26/2011 FINAL   Final   CLOSTRIDIUM DIFFICILE BY PCR     Status: Normal   Collection Time   09/24/11 11:56 AM      Component Value Range Status Comment   C difficile by pcr NEGATIVE  NEGATIVE Final      Studies:  Recent x-ray studies have been reviewed in detail by the Attending Physician  Scheduled Meds:  Reviewed in detail by the Attending Physician   Junious Silk, ANP [facility] Office  [phone] Pager [phone]  On-Call/Text Page:      [email or web address]      password [email or web address]  If 7PM-7AM, please contact night-coverage [email or web address] Password [email or web address] 09/26/2011, 12:40 PM   LOS: 3 days   I have personally examined this patient and reviewed the entire database. I have reviewed the above note, made any necessary editorial changes, and agree with its content.  Lonia Blood, MD [facility]

## 2011-09-26 NOTE — Progress Notes (Signed)
Patient is active with [facility] Care Management has RN [facility] Management services for CHF disease management.  For any additional questions or new referrals please contact Anibal Henderson BSN RN [facility] Liaison at [phone].

## 2011-09-27 DIAGNOSIS — E860 Dehydration: Secondary | ICD-10-CM

## 2011-09-27 DIAGNOSIS — D509 Iron deficiency anemia, unspecified: Secondary | ICD-10-CM

## 2011-09-27 DIAGNOSIS — Z8679 Personal history of other diseases of the circulatory system: Secondary | ICD-10-CM

## 2011-09-27 LAB — BASIC METABOLIC PANEL
CO2: 23 mEq/L (ref 19–32)
Chloride: 107 mEq/L (ref 96–112)
Creatinine, Ser: 0.96 mg/dL (ref 0.50–1.35)

## 2011-09-27 LAB — CBC
HCT: 34.2 % — ABNORMAL LOW (ref 39.0–52.0)
MCV: 85.7 fL (ref 78.0–100.0)
RDW: 17.3 % — ABNORMAL HIGH (ref 11.5–15.5)
WBC: 16.7 10*3/uL — ABNORMAL HIGH (ref 4.0–10.5)

## 2011-09-27 MED ORDER — SODIUM GLYCEROPHOSPHATE 1 MMOLE/ML IV SOLN
20.0000 mmol | Freq: Once | INTRAVENOUS | Status: AC
  Administered 2011-09-27: 20 mmol via INTRAVENOUS
  Filled 2011-09-27: qty 20

## 2011-09-27 MED ORDER — PSYLLIUM 95 % PO PACK
1.0000 | PACK | Freq: Every day | ORAL | Status: DC
  Administered 2011-09-27 – 2011-10-11 (×12): 1 via ORAL
  Filled 2011-09-27 (×16): qty 1

## 2011-09-27 MED ORDER — CARVEDILOL 3.125 MG PO TABS
3.1250 mg | ORAL_TABLET | Freq: Two times a day (BID) | ORAL | Status: DC
  Administered 2011-09-27 – 2011-09-29 (×4): 3.125 mg via ORAL
  Filled 2011-09-27 (×6): qty 1

## 2011-09-27 MED ORDER — HYDROCORTISONE SOD SUCCINATE 100 MG IJ SOLR
25.0000 mg | Freq: Two times a day (BID) | INTRAMUSCULAR | Status: DC
  Administered 2011-09-27 – 2011-09-28 (×2): 25 mg via INTRAVENOUS
  Filled 2011-09-27 (×3): qty 0.5

## 2011-09-27 NOTE — Progress Notes (Signed)
[facility] PROGRESS NOTE  ASER NYLUND ZOX:[id number] DOB: Nov 23, 1932 DOA: 09/23/2011 PCP: Hoyle Sauer, MD  Assessment/Plan: Principal Problem:  *SIRS (systemic inflammatory response syndrome) Active Problems:  VENTRICULAR TACHYCARDIA  Chronic atrial fibrillation  Chronic systolic heart failure- EF 35-40%  AUTOMATIC IMPLANTABLE CARDIAC DEFIBRILLATOR SITU  CAD (coronary artery disease)  Bradycardia  Anemia, iron deficiency  Ataxia  Dehydration  Hypotension  HCAP (healthcare-associated pneumonia)  Thrombocytopenia  Weakness generalized  Diarrhea Hypophophatemia  Sepsis - Resolved - Will decrease stress dosing steroids today and if patient has had only five days of steroid therapy would plan on discontinuing all together.  Likely tomorrow.  1. HAP - Pt is currently on IV abx's Vancomycin and Zosyn. - Sputum cultures show normal oropharyngeal flora - Plan on switching to oral regimen once WBC trends down lower.  Currently at 16.7  2. Chronic Atrial Fibrillation - Patient is on amiodarone - Likely elevated due to recent infection.  Patient is on IV antibiotics - Will add carvedilol at low dose.  Titrate up if tolerated.  3. Dehydration - Likely contributing to elevated heart rates and ARF. - BUN/Creatinine ration still elevated. Will increase IVF rate to 75 cc/hr  4. Anemia - Stable currently.  No active bleeding.  May have been myelosuppression secondary to recent infection vs chronic anemia.  5. Diarrhea - C diff pcr negative - Add fiber supplement - Likely contributed to low phosphorus. - d/c colace  6. Hypophosphatemia - Likely secondary to # 5 - consult pharmacy for IV replacement.  May be contributing to current heart rate.  7. ARF - resolved with IV fluid rehydration.  May have been secondary to dehydration and sepsis.    Code Status: Full Family Communication: No family at bedside Disposition Plan: Pending continued clinical  improvement   Brief narrative: 78 year old male patient with multiple medical problems. Recent hospital admission July 2013 because of subdural hematoma. A CT scan of the chest done during that admission to rule out any form of malignancy demonstrated a moderate right-sided pleural effusion that subsequently resolved. He presented back to the hospital on 09/23/2011 and was found to be hypotensive, volume depleted and had right upper lobe healthcare acquired pneumonia. He was aggressively volume resuscitated and initiated on Zosyn and vancomycin. Because of persistent hypotension and worsening hypoxemia pulmonary critical care medicine assumed care of the patient shortly after admission. Patient did require pressor support briefly but was rapidly weaned. Subsequently he has improved significantly and stabilized enough to transfer out of the ICU to step down on 09/25/2011. [facility] assumed care of the patient on 09/26/2011.  Consultants: Pulmonary critical care medicine-signed off 09/25/2011  Procedures: Right subclavian central line 09/23/2011 >>> plan discontinue 09/26/2011  Antibiotics: 1) Zosyn (09/23/11)  2) Vancomycin (09/23/11)  HPI/Subjective: Patient mentions that he feels much better.  No acute issues reported overnight.  Had some elevated heart rate recordings that were not reported to me.  I had the nurse repeat his heart rate and she noticed it to be 112.    Objective: Filed Vitals:   09/27/11 0320 09/27/11 0450 09/27/11 0904 09/27/11 1338  BP:  128/76 135/92 113/65  Pulse: 95 95 132 145  Temp:  98.1 F (36.7 C) 97.6 F (36.4 C) 97.8 F (36.6 C)  TempSrc:  Oral  Oral  Resp:  16 19 20   Height:      Weight:      SpO2: 96% 97% 97% 95%    Intake/Output Summary (Last 24 hours) at  09/27/11 1551 Last data filed at 09/27/11 1339  Gross per 24 hour  Intake    960 ml  Output      0 ml  Net    960 ml   Filed Weights   09/25/11 0030 09/26/11 0603 09/26/11 2031  Weight:  86.9 kg (191 lb 9.3 oz) 87.4 kg (192 lb 10.9 oz) 87.5 kg (192 lb 14.4 oz)    Exam:   General:  Pt in NAD, A and O x 3  Cardiovascular: irregularly irregular, no murmurs  Respiratory: CTA BL, no Wheezes  Abdomen: Soft, NT, ND  Data Reviewed: Basic Metabolic Panel:  Lab 09/27/11 1610 09/26/11 0525 09/25/11 0405 09/24/11 0500 09/23/11 1400  NA 142 136 136 135 135  K 3.9 3.9 3.4* 3.6 3.9  CL 107 105 103 103 98  CO2 23 22 20 19 23   GLUCOSE 134* 132* 152* 121* 126*  BUN 28* 27* 32* 39* 45*  CREATININE 0.96 0.95 1.14 1.68* 2.63*  CALCIUM 8.5 8.4 8.6 7.8* 8.4  MG -- 1.9 1.7 -- --  PHOS -- 1.3* 1.9* -- --   Liver Function Tests:  Lab 09/24/11 0500 09/23/11 1400  AST 53* 42*  ALT 30 34  ALKPHOS 68 72  BILITOT 0.8 0.8  PROT 5.2* 5.7*  ALBUMIN 2.2* 2.6*    Lab 09/23/11 1400  LIPASE 8*  AMYLASE --   No results found for this basename: AMMONIA:5 in the last 168 hours CBC:  Lab 09/27/11 0655 09/26/11 0525 09/25/11 0405 09/24/11 0500 09/23/11 1803 09/23/11 1400  WBC 16.7* 21.0* 24.0* 30.4* 20.0* --  NEUTROABS -- -- -- -- -- 13.7*  HGB 11.5* 11.5* 12.5* 11.6* 12.3* --  HCT 34.2* 34.1* 37.2* 34.4* 37.2* --  MCV 85.7 86.5 85.5 86.9 87.1 --  PLT 152 168 175 178 161 --   Cardiac Enzymes: No results found for this basename: CKTOTAL:5,CKMB:5,CKMBINDEX:5,TROPONINI:5 in the last 168 hours BNP (last 3 results)  Basename 08/12/11 1604 11/29/10 0500 11/28/10 1338  PROBNP 422.7 1050.0* 1356.0*   CBG:  Lab 09/26/11 1152 09/26/11 0819 09/26/11 0529 09/26/11 0011 09/25/11 1957  GLUCAP 145* 120* 128* 125* 119*    Recent Results (from the past 240 hour(s))  CULTURE, BLOOD (ROUTINE X 2)     Status: Normal (Preliminary result)   Collection Time   09/23/11  4:40 PM      Component Value Range Status Comment   Specimen Description BLOOD HAND LEFT   Final    Special Requests BOTTLES DRAWN AEROBIC ONLY 10CC   Final    Culture  Setup Time 09/23/2011 21:08   Final    Culture     Final     Value:        BLOOD CULTURE RECEIVED NO GROWTH TO DATE CULTURE WILL BE HELD FOR 5 DAYS BEFORE ISSUING A FINAL NEGATIVE REPORT   Report Status PENDING   Incomplete   CULTURE, BLOOD (ROUTINE X 2)     Status: Normal (Preliminary result)   Collection Time   09/23/11  4:50 PM      Component Value Range Status Comment   Specimen Description BLOOD HAND LEFT   Final    Special Requests BOTTLES DRAWN AEROBIC ONLY 10CC   Final    Culture  Setup Time 09/23/2011 21:08   Final    Culture     Final    Value:        BLOOD CULTURE RECEIVED NO GROWTH TO DATE CULTURE WILL BE HELD FOR 5  DAYS BEFORE ISSUING A FINAL NEGATIVE REPORT   Report Status PENDING   Incomplete   MRSA PCR SCREENING     Status: Abnormal   Collection Time   09/23/11  6:03 PM      Component Value Range Status Comment   MRSA by PCR POSITIVE (*) NEGATIVE Final   URINE CULTURE     Status: Normal   Collection Time   09/23/11  6:39 PM      Component Value Range Status Comment   Specimen Description URINE, CATHETERIZED   Final    Special Requests NONE   Final    Culture  Setup Time 09/23/2011 21:02   Final    Colony Count NO GROWTH   Final    Culture NO GROWTH   Final    Report Status 09/25/2011 FINAL   Final   CULTURE, EXPECTORATED SPUTUM-ASSESSMENT     Status: Normal   Collection Time   09/23/11  8:39 PM      Component Value Range Status Comment   Specimen Description SPUTUM   Final    Special Requests NONE   Final    Sputum evaluation     Final    Value: THIS SPECIMEN IS ACCEPTABLE. RESPIRATORY CULTURE REPORT TO FOLLOW.   Report Status 09/23/2011 FINAL   Final   CULTURE, RESPIRATORY     Status: Normal   Collection Time   09/23/11  8:39 PM      Component Value Range Status Comment   Specimen Description SPUTUM   Final    Special Requests NONE   Final    Gram Stain     Final    Value: FEW WBC PRESENT,BOTH PMN AND MONONUCLEAR     RARE SQUAMOUS EPITHELIAL CELLS PRESENT     RARE GRAM POSITIVE COCCI     IN PAIRS IN CLUSTERS RARE  GRAM POSITIVE RODS   Culture NORMAL OROPHARYNGEAL FLORA   Final    Report Status 09/26/2011 FINAL   Final   CLOSTRIDIUM DIFFICILE BY PCR     Status: Normal   Collection Time   09/24/11 11:56 AM      Component Value Range Status Comment   C difficile by pcr NEGATIVE  NEGATIVE Final      Studies: Dg Chest Port 1 View  09/24/2011  *RADIOLOGY REPORT*  Clinical Data: Right-sided central line placement.  PORTABLE CHEST - 1 VIEW  Comparison: 09/23/2011  Findings: Interval right subclavian approach central venous catheter placement.  The the catheter projects over the SVC however I cannot confirm tip position due to the AICD leads.  Again noted is a right upper lobe consolidation.  Aortic arch atherosclerosis. Heart size upper normal to mildly enlarged.  No interval osseous change.  No pneumothorax.  No definite pleural effusion.  IMPRESSION: Right subclavian approach catheter projects over the SVC, however I cannot confirm tip position as it is obscured by the AICD leads.  Right upper lobe consolidation is most in keeping with pneumonia. Recommend radiographic follow-up after treatment to document resolution.   Original Report Authenticated By: Waneta Martins, M.D.    Dg Abd Acute W/chest  09/23/2011  *RADIOLOGY REPORT*  Clinical Data: Weakness, diarrhea, history hypertension, CHF, coronary disease post MI and CABG, asthma, diabetes  ACUTE ABDOMEN SERIES (ABDOMEN 2 VIEW & CHEST 1 VIEW)  Comparison: 08/17/2011  Findings: Left subclavian transvenous AICD leads project over right atrium and right ventricle. Epicardial pacing wires and defibrillator leads present. Upper normal heart size post median sternotomy. Atherosclerotic calcification  of a tortuous thoracic aorta. Right upper lobe infiltrate consistent with pneumonia. Remaining lungs clear. Bones demineralized.  Surgical clips right upper quadrant question cholecystectomy. Nonobstructive bowel gas pattern. No bowel dilatation, bowel wall thickening, or  free intraperitoneal air. Calcifications project over the kidneys bilaterally question renal calculi. Bilateral pelvic phleboliths and atherosclerotic calcifications. Degenerative changes lumbar spine and bilateral hip joints.  IMPRESSION: Nonobstructive bowel gas pattern. Question bilateral renal calculi. Right upper lobe pneumonia. Due to the more focal parenchymal opacity at the right apex, follow- up chest radiographs until resolution recommended to exclude underlying abnormalities including tumor.   Original Report Authenticated By: Lollie Marrow, M.D.     Scheduled Meds:   . amiodarone  200 mg Oral Daily  . antiseptic oral rinse  15 mL Mouth Rinse BID  . atorvastatin  10 mg Oral q1800  . Chlorhexidine Gluconate Cloth  6 each Topical Q0600  . cycloSPORINE  1 drop Both Eyes BID  . dexlansoprazole  60 mg Oral Q1200  . hydrocortisone sod succinate (SOLU-CORTEF) injection  25 mg Intravenous Q8H  . mupirocin ointment  1 application Nasal BID  . omega-3 acid ethyl esters  1 g Oral BID  . phosphorus  500 mg Oral BID  . piperacillin-tazobactam (ZOSYN)  IV  3.375 g Intravenous Q8H  . sodium chloride  3 mL Intravenous Q12H  . vancomycin  1,000 mg Intravenous BID  . DISCONTD: docusate sodium  100 mg Oral BID   Continuous Infusions:   . sodium chloride 20 mL/hr at 09/26/11 0800    Principal Problem:  *SIRS (systemic inflammatory response syndrome) Active Problems:  VENTRICULAR TACHYCARDIA  Chronic atrial fibrillation  Chronic systolic heart failure- EF 35-40%  AUTOMATIC IMPLANTABLE CARDIAC DEFIBRILLATOR SITU  CAD (coronary artery disease)  Bradycardia  Anemia, iron deficiency  Ataxia  Dehydration  Hypotension  HCAP (healthcare-associated pneumonia)  Thrombocytopenia  Weakness generalized  Diarrhea    Time spent: > 45 minutes    Penny Pia  [facility] Pager [phone]. If 8PM-8AM, please contact night-coverage at [email or web address], password [facility] 09/27/2011, 3:51 PM   LOS: 4 days

## 2011-09-27 NOTE — Progress Notes (Signed)
MEDICATION RELATED CONSULT NOTE - INITIAL   Pharmacy Consult for phos replacement Indication: hypophophatemia  Allergies  Allergen Reactions  . Celebrex (Celecoxib) Hives and Other (See Comments)    Gi upset  . Digoxin Other (See Comments)    Unknown   . Esomeprazole Magnesium Hives    "don't really remember"  . Multaq (Dronedarone Hydrochloride) Other (See Comments)    "don't remember"  . Protonix (Pantoprazole Sodium) Nausea And Vomiting    Tolerates Dexilant    Patient Measurements: Height: 6' (182.9 cm) Weight: 192 lb 14.4 oz (87.5 kg) IBW/kg (Calculated) : 77.6   Vital Signs: Temp: 97.8 F (36.6 C) (08/27 1338) Temp src: Oral (08/27 1338) BP: 113/65 mmHg (08/27 1338) Pulse Rate: 112  (08/27 1600) Intake/Output from previous day: 08/26 0701 - 08/27 0700 In: 690 [P.O.:240; I.V.:200; IV Piggyback:250] Out: -  Intake/Output from this shift: Total I/O In: 540 [P.O.:540] Out: -   Labs:  Basename 09/27/11 0655 09/26/11 0525 09/25/11 0405  WBC 16.7* 21.0* 24.0*  HGB 11.5* 11.5* 12.5*  HCT 34.2* 34.1* 37.2*  PLT 152 168 175  APTT -- -- --  CREATININE 0.96 0.95 1.14  LABCREA -- -- --  CREATININE 0.96 0.95 1.14  CREAT24HRUR -- -- --  MG -- 1.9 1.7  PHOS -- 1.3* 1.9*  ALBUMIN -- -- --  PROT -- -- --  ALBUMIN -- -- --  AST -- -- --  ALT -- -- --  ALKPHOS -- -- --  BILITOT -- -- --  BILIDIR -- -- --  IBILI -- -- --   Estimated Creatinine Clearance: 71.9 ml/min (by C-G formula based on Cr of 0.96).   Microbiology: Recent Results (from the past 720 hour(s))  CULTURE, BLOOD (ROUTINE X 2)     Status: Normal (Preliminary result)   Collection Time   09/23/11  4:40 PM      Component Value Range Status Comment   Specimen Description BLOOD HAND LEFT   Final    Special Requests BOTTLES DRAWN AEROBIC ONLY 10CC   Final    Culture  Setup Time 09/23/2011 21:08   Final    Culture     Final    Value:        BLOOD CULTURE RECEIVED NO GROWTH TO DATE CULTURE WILL BE  HELD FOR 5 DAYS BEFORE ISSUING A FINAL NEGATIVE REPORT   Report Status PENDING   Incomplete   CULTURE, BLOOD (ROUTINE X 2)     Status: Normal (Preliminary result)   Collection Time   09/23/11  4:50 PM      Component Value Range Status Comment   Specimen Description BLOOD HAND LEFT   Final    Special Requests BOTTLES DRAWN AEROBIC ONLY 10CC   Final    Culture  Setup Time 09/23/2011 21:08   Final    Culture     Final    Value:        BLOOD CULTURE RECEIVED NO GROWTH TO DATE CULTURE WILL BE HELD FOR 5 DAYS BEFORE ISSUING A FINAL NEGATIVE REPORT   Report Status PENDING   Incomplete   MRSA PCR SCREENING     Status: Abnormal   Collection Time   09/23/11  6:03 PM      Component Value Range Status Comment   MRSA by PCR POSITIVE (*) NEGATIVE Final   URINE CULTURE     Status: Normal   Collection Time   09/23/11  6:39 PM      Component Value Range Status Comment  Specimen Description URINE, CATHETERIZED   Final    Special Requests NONE   Final    Culture  Setup Time 09/23/2011 21:02   Final    Colony Count NO GROWTH   Final    Culture NO GROWTH   Final    Report Status 09/25/2011 FINAL   Final   CULTURE, EXPECTORATED SPUTUM-ASSESSMENT     Status: Normal   Collection Time   09/23/11  8:39 PM      Component Value Range Status Comment   Specimen Description SPUTUM   Final    Special Requests NONE   Final    Sputum evaluation     Final    Value: THIS SPECIMEN IS ACCEPTABLE. RESPIRATORY CULTURE REPORT TO FOLLOW.   Report Status 09/23/2011 FINAL   Final   CULTURE, RESPIRATORY     Status: Normal   Collection Time   09/23/11  8:39 PM      Component Value Range Status Comment   Specimen Description SPUTUM   Final    Special Requests NONE   Final    Gram Stain     Final    Value: FEW WBC PRESENT,BOTH PMN AND MONONUCLEAR     RARE SQUAMOUS EPITHELIAL CELLS PRESENT     RARE GRAM POSITIVE COCCI     IN PAIRS IN CLUSTERS RARE GRAM POSITIVE RODS   Culture NORMAL OROPHARYNGEAL FLORA   Final    Report  Status 09/26/2011 FINAL   Final   CLOSTRIDIUM DIFFICILE BY PCR     Status: Normal   Collection Time   09/24/11 11:56 AM      Component Value Range Status Comment   C difficile by pcr NEGATIVE  NEGATIVE Final     Medical History: Past Medical History  Diagnosis Date  . Chronic back pain     "top of neck to lower back"  . Ischemic cardiomyopathy     WITH CHF  . CHF (congestive heart failure)     EF 35-40% s/p most recent ICD generator change-out with [employer] dual-chamber ICD 05/20/11 with explantation of previous abdominally-implanted device  . Hypertension   . Dyslipidemia   . Erythrocytosis   . GERD (gastroesophageal reflux disease)   . Abnormal thyroid scan     Abnormal thyroid imaging studies from 11/09/2010, status post ultrasound guided fine needle aspiration of the dominant left inferior thyroid nodule on 12/15/2010. Cytology report showed rare follicular epithelial cells and hemosiderin laden macrophages.  . Coronary artery disease     s/p CABG 1983 and PCI/stent 2004.   Marland Kitchen Headache   . Atrial fibrillation     on chronic Coumadin  . VT (ventricular tachycardia)   . ICD (implantable cardiac defibrillator) in place   . Atrial fibrillation   . Heart murmur   . Anginal pain   . Myocardial infarction 1983; ~ 1990  . Shortness of breath     "once in awhile when I'm relaxing"  . Asthma   . Diabetes mellitus     diet controlled  . Arthritis     "all over"  . Family history of anesthesia complication     Medications:  Scheduled:    . amiodarone  200 mg Oral Daily  . antiseptic oral rinse  15 mL Mouth Rinse BID  . atorvastatin  10 mg Oral q1800  . Chlorhexidine Gluconate Cloth  6 each Topical Q0600  . cycloSPORINE  1 drop Both Eyes BID  . dexlansoprazole  60 mg Oral Q1200  . hydrocortisone  sod succinate (SOLU-CORTEF) injection  25 mg Intravenous Q12H  . mupirocin ointment  1 application Nasal BID  . omega-3 acid ethyl esters  1 g Oral BID  . phosphorus  500 mg Oral  BID  . piperacillin-tazobactam (ZOSYN)  IV  3.375 g Intravenous Q8H  . psyllium  1 packet Oral Daily  . sodium chloride  3 mL Intravenous Q12H  . sodium glycerophosphate 0.9% NaCl IVPB  20 mmol Intravenous Once  . vancomycin  1,000 mg Intravenous BID  . DISCONTD: docusate sodium  100 mg Oral BID  . 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 MD prefer to use IV replacement. Since we have the new product that has to be infused over 8hr. Will use a slightly lower dose.  Plan:  1. Glycerophophate IV x1 2. Recheck level in AM  [place], [name] 09/27/2011,4:22 PM

## 2011-09-28 DIAGNOSIS — K219 Gastro-esophageal reflux disease without esophagitis: Secondary | ICD-10-CM

## 2011-09-28 LAB — BASIC METABOLIC PANEL
Chloride: 107 mEq/L (ref 96–112)
Creatinine, Ser: 0.94 mg/dL (ref 0.50–1.35)
GFR calc Af Amer: >90 mL/min (ref >90)
GFR calc non Af Amer: 79 mL/min — ABNORMAL LOW (ref >90)
Potassium: 3.9 mEq/L (ref 3.5–5.1)

## 2011-09-28 LAB — PHOSPHORUS: Phosphorus: 3.3 mg/dL (ref 2.3–4.6)

## 2011-09-28 LAB — CBC
HCT: 34.4 % — ABNORMAL LOW (ref 39.0–52.0)
Hemoglobin: 11.4 g/dL — ABNORMAL LOW (ref 13.0–17.0)
MCHC: 33.1 g/dL (ref 30.0–36.0)
RDW: 17.6 % — ABNORMAL HIGH (ref 11.5–15.5)
WBC: 12.7 10*3/uL — ABNORMAL HIGH (ref 4.0–10.5)

## 2011-09-28 LAB — VANCOMYCIN, TROUGH: Vancomycin Tr: 18 ug/mL (ref 10.0–20.0)

## 2011-09-28 MED ORDER — AZITHROMYCIN 500 MG PO TABS
500.0000 mg | ORAL_TABLET | ORAL | Status: DC
  Administered 2011-09-28: 500 mg via ORAL
  Filled 2011-09-28 (×2): qty 1

## 2011-09-28 MED ORDER — AMOXICILLIN-POT CLAVULANATE 875-125 MG PO TABS
1.0000 | ORAL_TABLET | Freq: Two times a day (BID) | ORAL | Status: DC
  Administered 2011-09-28 – 2011-09-29 (×2): 1 via ORAL
  Filled 2011-09-28 (×3): qty 1

## 2011-09-28 NOTE — Progress Notes (Signed)
ANTIBIOTIC CONSULT NOTE - FOLLOW UP  Pharmacy Consult for Vancomycin and Zosyn Indication: pneumonia  Allergies  Allergen Reactions  . Celebrex (Celecoxib) Hives and Other (See Comments)    Gi upset  . Digoxin Other (See Comments)    Unknown   . Esomeprazole Magnesium Hives    "don't really remember"  . Multaq (Dronedarone Hydrochloride) Other (See Comments)    "don't remember"  . Protonix (Pantoprazole Sodium) Nausea And Vomiting    Tolerates Dexilant    Patient Measurements: Height: 6' (182.9 cm) Weight: 192 lb 14.4 oz (87.5 kg) IBW/kg (Calculated) : 77.6   Vital Signs: Temp: 97.4 F (36.3 C) (08/28 0857) Temp src: Oral (08/28 0857) BP: 132/80 mmHg (08/28 0857) Pulse Rate: 116  (08/28 0857) Intake/Output from previous day: 08/27 0701 - 08/28 0700 In: 1020 [P.O.:1020] Out: 100 [Urine:100] Intake/Output from this shift: Total I/O In: 240 [P.O.:240] Out: -   Labs:  Basename 09/28/11 0530 09/27/11 0655 09/26/11 0525  WBC 12.7* 16.7* 21.0*  HGB 11.4* 11.5* 11.5*  PLT 152 152 168  LABCREA -- -- --  CREATININE 0.94 0.96 0.95   Estimated Creatinine Clearance: 73.4 ml/min (by C-G formula based on Cr of 0.94).  Basename 09/28/11 0941  VANCOTROUGH 18.0  VANCOPEAK --  VANCORANDOM --  GENTTROUGH --  GENTPEAK --  GENTRANDOM --  TOBRATROUGH --  TOBRAPEAK --  TOBRARND --  AMIKACINPEAK --  AMIKACINTROU --  AMIKACIN --     Microbiology: Recent Results (from the past 720 hour(s))  CULTURE, BLOOD (ROUTINE X 2)     Status: Normal (Preliminary result)   Collection Time   09/23/11  4:40 PM      Component Value Range Status Comment   Specimen Description BLOOD HAND LEFT   Final    Special Requests BOTTLES DRAWN AEROBIC ONLY 10CC   Final    Culture  Setup Time 09/23/2011 21:08   Final    Culture     Final    Value:        BLOOD CULTURE RECEIVED NO GROWTH TO DATE CULTURE WILL BE HELD FOR 5 DAYS BEFORE ISSUING A FINAL NEGATIVE REPORT   Report Status PENDING    Incomplete   CULTURE, BLOOD (ROUTINE X 2)     Status: Normal (Preliminary result)   Collection Time   09/23/11  4:50 PM      Component Value Range Status Comment   Specimen Description BLOOD HAND LEFT   Final    Special Requests BOTTLES DRAWN AEROBIC ONLY 10CC   Final    Culture  Setup Time 09/23/2011 21:08   Final    Culture     Final    Value:        BLOOD CULTURE RECEIVED NO GROWTH TO DATE CULTURE WILL BE HELD FOR 5 DAYS BEFORE ISSUING A FINAL NEGATIVE REPORT   Report Status PENDING   Incomplete   MRSA PCR SCREENING     Status: Abnormal   Collection Time   09/23/11  6:03 PM      Component Value Range Status Comment   MRSA by PCR POSITIVE (*) NEGATIVE Final   URINE CULTURE     Status: Normal   Collection Time   09/23/11  6:39 PM      Component Value Range Status Comment   Specimen Description URINE, CATHETERIZED   Final    Special Requests NONE   Final    Culture  Setup Time 09/23/2011 21:02   Final    Colony Count NO  GROWTH   Final    Culture NO GROWTH   Final    Report Status 09/25/2011 FINAL   Final   CULTURE, EXPECTORATED SPUTUM-ASSESSMENT     Status: Normal   Collection Time   09/23/11  8:39 PM      Component Value Range Status Comment   Specimen Description SPUTUM   Final    Special Requests NONE   Final    Sputum evaluation     Final    Value: THIS SPECIMEN IS ACCEPTABLE. RESPIRATORY CULTURE REPORT TO FOLLOW.   Report Status 09/23/2011 FINAL   Final   CULTURE, RESPIRATORY     Status: Normal   Collection Time   09/23/11  8:39 PM      Component Value Range Status Comment   Specimen Description SPUTUM   Final    Special Requests NONE   Final    Gram Stain     Final    Value: FEW WBC PRESENT,BOTH PMN AND MONONUCLEAR     RARE SQUAMOUS EPITHELIAL CELLS PRESENT     RARE GRAM POSITIVE COCCI     IN PAIRS IN CLUSTERS RARE GRAM POSITIVE RODS   Culture NORMAL OROPHARYNGEAL FLORA   Final    Report Status 09/26/2011 FINAL   Final   CLOSTRIDIUM DIFFICILE BY PCR     Status: Normal    Collection Time   09/24/11 11:56 AM      Component Value Range Status Comment   C difficile by pcr NEGATIVE  NEGATIVE Final     Anti-infectives     Start     Dose/Rate Route Frequency Ordered Stop   09/25/11 1100   vancomycin (VANCOCIN) IVPB 1000 mg/200 mL premix        1,000 mg 200 mL/hr over 60 Minutes Intravenous 2 times daily 09/25/11 1046     09/24/11 2000   vancomycin (VANCOCIN) IVPB 1000 mg/200 mL premix  Status:  Discontinued        1,000 mg 200 mL/hr over 60 Minutes Intravenous Every 24 hours 09/23/11 1818 09/25/11 1046   09/24/11 1400  piperacillin-tazobactam (ZOSYN) IVPB 3.375 g       3.375 g 12.5 mL/hr over 240 Minutes Intravenous 3 times per day 09/24/11 1131     09/23/11 2359   piperacillin-tazobactam (ZOSYN) IVPB 2.25 g  Status:  Discontinued        2.25 g 100 mL/hr over 30 Minutes Intravenous 4 times per day 09/23/11 1756 09/24/11 1131   09/23/11 1815  piperacillin-tazobactam (ZOSYN) IVPB 3.375 g       3.375 g 100 mL/hr over 30 Minutes Intravenous  Once 09/23/11 1812 09/23/11 1848   09/23/11 1530   vancomycin (VANCOCIN) IVPB 1000 mg/200 mL premix        1,000 mg 200 mL/hr over 60 Minutes Intravenous  Once 09/23/11 1522 09/23/11 1920   09/23/11 1530   piperacillin-tazobactam (ZOSYN) IVPB 3.375 g  Status:  Discontinued        3.375 g 12.5 mL/hr over 240 Minutes Intravenous  Once 09/23/11 1522 09/23/11 1812          Assessment: 78 y.o. M on Vancomycin + Zosyn D#6 for empiric HCAP coverage. Vancomycin trough level drawn this morning was therapeutic (Vanc trough 18 mcg/mL, goal of 15-20 mcg/mL)  To continue antibiotic therapy with Vancomycin and Zosyn.  His renal function has now stabilized.  All cultures (BCx, Sputum Cx, UCx on 8/23) are NG or NGTD. Doses remain appropriate.   Consider addressing antibiotic length of  therapy and discontinuing  after 8 days for treatment of possible HCAP.  Goal of Therapy:  Vancomycin trough level 15-20 mcg/ml  Plan:  1.  Continue Vancomycin 1g IV every 12 hours 2. Continue Zosyn 3.375g IV every 8 hours 3. Will continue to follow renal function, culture results, LOT, and antibiotic de-escalation plans   Georgina Pillion, PharmD, BCPS Clinical Pharmacist Pager: [phone] 09/28/2011 12:51 PM

## 2011-09-28 NOTE — Progress Notes (Signed)
Patient indicated that his daughter will be coming from [place] to stay with him at home post discharge.  We will continue to monitor his stability collaboratively with home health.  Patient will be provided a transition of care call.  For any additional questions or new referrals please contact Anibal Henderson BSN RN [facility] Liaison at [phone].

## 2011-09-28 NOTE — Progress Notes (Signed)
[facility] PROGRESS NOTE  [name] EAV:[id number] DOB: [birth year] DOA: 09/23/2011 PCP: Hoyle Sauer, MD  Assessment/Plan: Principal Problem:  *SIRS (systemic inflammatory response syndrome) Active Problems:  VENTRICULAR TACHYCARDIA  Chronic atrial fibrillation  Chronic systolic heart failure- EF 35-40%  AUTOMATIC IMPLANTABLE CARDIAC DEFIBRILLATOR SITU  CAD (coronary artery disease)  Bradycardia  Anemia, iron deficiency  Ataxia  Dehydration  Hypotension  HCAP (healthcare-associated pneumonia)  Thrombocytopenia  Weakness generalized  Diarrhea Hypophophatemia  Sepsis - Resolved - Will discontinue stress dosing steroids today 8/28 Solucortef started on 8/23 and today is day five of steroid administration.  Given that he has only had 5 days of steroid therapy he likely has not had adrenal suppresion and as such will not taper patient off of solucortef.  1. HAP - Pt is currently on IV abx's Vancomycin and Zosyn will switch 8/28 to augmenting and azithromycin.  Considered Levaquin but patient is on amiodarone. - Sputum cultures show normal oropharyngeal flora - WBC trending down and on last check was 12.7  2. Chronic Atrial Fibrillation - Patient is on amiodarone - Will add carvedilol at low dose.  Titrate up if tolerated. - Had RVR but heart rate has improved with carvedilol administration at 3.125 mg.  3. Dehydration - Likely contributing to elevated heart rates and ARF. - BUN/Creatinine ration still elevated. Will continue IVF rate to 75 cc/hr  4. Anemia - Stable currently.  No active bleeding.  May have been myelosuppression secondary to recent infection vs chronic anemia.  5. Diarrhea - C diff pcr negative - Add fiber supplement - Likely contributed to low phosphorus. - d/c colace  6. Hypophosphatemia - Likely secondary to # 5 - consult pharmacy for IV replacement.  May be contributing to current heart rate. - Phosphorus within normal limits  after IV replacement.    7. ARF - resolved with IV fluid rehydration.  May have been secondary to dehydration and sepsis.    Code Status: Full Family Communication: No family at bedside Disposition Plan: Pending continued clinical improvement   Brief narrative: 78 year old male patient with multiple medical problems. Recent hospital admission July 2013 because of subdural hematoma. A CT scan of the chest done during that admission to rule out any form of malignancy demonstrated a moderate right-sided pleural effusion that subsequently resolved. He presented back to the hospital on 09/23/2011 and was found to be hypotensive, volume depleted and had right upper lobe healthcare acquired pneumonia. He was aggressively volume resuscitated and initiated on Zosyn and vancomycin. Because of persistent hypotension and worsening hypoxemia pulmonary critical care medicine assumed care of the patient shortly after admission. Patient did require pressor support briefly but was rapidly weaned. Subsequently he has improved significantly and stabilized enough to transfer out of the ICU to step down on 09/25/2011. [facility] assumed care of the patient on 09/26/2011.  Consultants: Pulmonary critical care medicine-signed off 09/25/2011  Procedures: Right subclavian central line 09/23/2011 >>> plan discontinue 09/26/2011  Antibiotics: 1) Zosyn (09/23/11)  2) Vancomycin (09/23/11)  HPI/Subjective: Patient mentions that he feels much better.  No acute issues reported overnight.  No new complaints.    Objective: Filed Vitals:   09/28/11 0530 09/28/11 0857 09/28/11 1823 09/28/11 1827  BP: 104/80 132/80 146/69   Pulse: 92 116 94 64  Temp: 97.6 F (36.4 C) 97.4 F (36.3 C) 97.6 F (36.4 C)   TempSrc: Oral Oral Oral   Resp: 20 16 18    Height:      Weight:  SpO2: 97% 90% 95%     Intake/Output Summary (Last 24 hours) at 09/28/11 2032 Last data filed at 09/28/11 1900  Gross per 24 hour  Intake    1511 ml  Output      0 ml  Net   1511 ml   Filed Weights   09/26/11 0603 09/26/11 2031 09/27/11 2027  Weight: 87.4 kg (192 lb 10.9 oz) 87.5 kg (192 lb 14.4 oz) 87.5 kg (192 lb 14.4 oz)    Exam:   General:  Pt in NAD, A and O x 3  Cardiovascular: irregularly irregular, no murmurs  Respiratory: Mild rhales, no Wheezes  Abdomen: Soft, NT, ND  Data Reviewed: Basic Metabolic Panel:  Lab 09/28/11 1610 09/27/11 0655 09/26/11 0525 09/25/11 0405 09/24/11 0500  NA 142 142 136 136 135  K 3.9 3.9 3.9 3.4* 3.6  CL 107 107 105 103 103  CO2 27 23 22 20 19   GLUCOSE 139* 134* 132* 152* 121*  BUN 26* 28* 27* 32* 39*  CREATININE 0.94 0.96 0.95 1.14 1.68*  CALCIUM 8.3* 8.5 8.4 8.6 7.8*  MG -- -- 1.9 1.7 --  PHOS 3.3 -- 1.3* 1.9* --   Liver Function Tests:  Lab 09/24/11 0500 09/23/11 1400  AST 53* 42*  ALT 30 34  ALKPHOS 68 72  BILITOT 0.8 0.8  PROT 5.2* 5.7*  ALBUMIN 2.2* 2.6*    Lab 09/23/11 1400  LIPASE 8*  AMYLASE --   No results found for this basename: AMMONIA:5 in the last 168 hours CBC:  Lab 09/28/11 0530 09/27/11 0655 09/26/11 0525 09/25/11 0405 09/24/11 0500 09/23/11 1400  WBC 12.7* 16.7* 21.0* 24.0* 30.4* --  NEUTROABS -- -- -- -- -- 13.7*  HGB 11.4* 11.5* 11.5* 12.5* 11.6* --  HCT 34.4* 34.2* 34.1* 37.2* 34.4* --  MCV 88.0 85.7 86.5 85.5 86.9 --  PLT 152 152 168 175 178 --   Cardiac Enzymes: No results found for this basename: CKTOTAL:5,CKMB:5,CKMBINDEX:5,TROPONINI:5 in the last 168 hours BNP (last 3 results)  Basename 08/12/11 1604 11/29/10 0500 11/28/10 1338  PROBNP 422.7 1050.0* 1356.0*   CBG:  Lab 09/26/11 1152 09/26/11 0819 09/26/11 0529 09/26/11 0011 09/25/11 1957  GLUCAP 145* 120* 128* 125* 119*    Recent Results (from the past 240 hour(s))  CULTURE, BLOOD (ROUTINE X 2)     Status: Normal (Preliminary result)   Collection Time   09/23/11  4:40 PM      Component Value Range Status Comment   Specimen Description BLOOD HAND LEFT   Final     Special Requests BOTTLES DRAWN AEROBIC ONLY 10CC   Final    Culture  Setup Time 09/23/2011 21:08   Final    Culture     Final    Value:        BLOOD CULTURE RECEIVED NO GROWTH TO DATE CULTURE WILL BE HELD FOR 5 DAYS BEFORE ISSUING A FINAL NEGATIVE REPORT   Report Status PENDING   Incomplete   CULTURE, BLOOD (ROUTINE X 2)     Status: Normal (Preliminary result)   Collection Time   09/23/11  4:50 PM      Component Value Range Status Comment   Specimen Description BLOOD HAND LEFT   Final    Special Requests BOTTLES DRAWN AEROBIC ONLY 10CC   Final    Culture  Setup Time 09/23/2011 21:08   Final    Culture     Final    Value:        BLOOD  CULTURE RECEIVED NO GROWTH TO DATE CULTURE WILL BE HELD FOR 5 DAYS BEFORE ISSUING A FINAL NEGATIVE REPORT   Report Status PENDING   Incomplete   MRSA PCR SCREENING     Status: Abnormal   Collection Time   09/23/11  6:03 PM      Component Value Range Status Comment   MRSA by PCR POSITIVE (*) NEGATIVE Final   URINE CULTURE     Status: Normal   Collection Time   09/23/11  6:39 PM      Component Value Range Status Comment   Specimen Description URINE, CATHETERIZED   Final    Special Requests NONE   Final    Culture  Setup Time 09/23/2011 21:02   Final    Colony Count NO GROWTH   Final    Culture NO GROWTH   Final    Report Status 09/25/2011 FINAL   Final   CULTURE, EXPECTORATED SPUTUM-ASSESSMENT     Status: Normal   Collection Time   09/23/11  8:39 PM      Component Value Range Status Comment   Specimen Description SPUTUM   Final    Special Requests NONE   Final    Sputum evaluation     Final    Value: THIS SPECIMEN IS ACCEPTABLE. RESPIRATORY CULTURE REPORT TO FOLLOW.   Report Status 09/23/2011 FINAL   Final   CULTURE, RESPIRATORY     Status: Normal   Collection Time   09/23/11  8:39 PM      Component Value Range Status Comment   Specimen Description SPUTUM   Final    Special Requests NONE   Final    Gram Stain     Final    Value: FEW WBC  PRESENT,BOTH PMN AND MONONUCLEAR     RARE SQUAMOUS EPITHELIAL CELLS PRESENT     RARE GRAM POSITIVE COCCI     IN PAIRS IN CLUSTERS RARE GRAM POSITIVE RODS   Culture NORMAL OROPHARYNGEAL FLORA   Final    Report Status 09/26/2011 FINAL   Final   CLOSTRIDIUM DIFFICILE BY PCR     Status: Normal   Collection Time   09/24/11 11:56 AM      Component Value Range Status Comment   C difficile by pcr NEGATIVE  NEGATIVE Final      Studies: Dg Chest Port 1 View  09/24/2011  *RADIOLOGY REPORT*  Clinical Data: Right-sided central line placement.  PORTABLE CHEST - 1 VIEW  Comparison: 09/23/2011  Findings: Interval right subclavian approach central venous catheter placement.  The the catheter projects over the SVC however I cannot confirm tip position due to the AICD leads.  Again noted is a right upper lobe consolidation.  Aortic arch atherosclerosis. Heart size upper normal to mildly enlarged.  No interval osseous change.  No pneumothorax.  No definite pleural effusion.  IMPRESSION: Right subclavian approach catheter projects over the SVC, however I cannot confirm tip position as it is obscured by the AICD leads.  Right upper lobe consolidation is most in keeping with pneumonia. Recommend radiographic follow-up after treatment to document resolution.   Original Report Authenticated By: Waneta Martins, M.D.    Dg Abd Acute W/chest  09/23/2011  *RADIOLOGY REPORT*  Clinical Data: Weakness, diarrhea, history hypertension, CHF, coronary disease post MI and CABG, asthma, diabetes  ACUTE ABDOMEN SERIES (ABDOMEN 2 VIEW & CHEST 1 VIEW)  Comparison: 08/17/2011  Findings: Left subclavian transvenous AICD leads project over right atrium and right ventricle. Epicardial pacing wires and  defibrillator leads present. Upper normal heart size post median sternotomy. Atherosclerotic calcification of a tortuous thoracic aorta. Right upper lobe infiltrate consistent with pneumonia. Remaining lungs clear. Bones demineralized.   Surgical clips right upper quadrant question cholecystectomy. Nonobstructive bowel gas pattern. No bowel dilatation, bowel wall thickening, or free intraperitoneal air. Calcifications project over the kidneys bilaterally question renal calculi. Bilateral pelvic phleboliths and atherosclerotic calcifications. Degenerative changes lumbar spine and bilateral hip joints.  IMPRESSION: Nonobstructive bowel gas pattern. Question bilateral renal calculi. Right upper lobe pneumonia. Due to the more focal parenchymal opacity at the right apex, follow- up chest radiographs until resolution recommended to exclude underlying abnormalities including tumor.   Original Report Authenticated By: Lollie Marrow, M.D.     Scheduled Meds:    . amiodarone  200 mg Oral Daily  . amoxicillin-clavulanate  1 tablet Oral Q12H  . antiseptic oral rinse  15 mL Mouth Rinse BID  . atorvastatin  10 mg Oral q1800  . azithromycin  500 mg Oral Daily  . carvedilol  3.125 mg Oral BID WC  . Chlorhexidine Gluconate Cloth  6 each Topical Q0600  . cycloSPORINE  1 drop Both Eyes BID  . dexlansoprazole  60 mg Oral Q1200  . hydrocortisone sod succinate (SOLU-CORTEF) injection  25 mg Intravenous Q12H  . mupirocin ointment  1 application Nasal BID  . omega-3 acid ethyl esters  1 g Oral BID  . psyllium  1 packet Oral Daily  . sodium chloride  3 mL Intravenous Q12H  . sodium glycerophosphate 0.9% NaCl IVPB  20 mmol Intravenous Once  . DISCONTD: piperacillin-tazobactam (ZOSYN)  IV  3.375 g Intravenous Q8H  . DISCONTD: vancomycin  1,000 mg Intravenous BID   Continuous Infusions:    . sodium chloride 75 mL/hr at 09/28/11 0745    Principal Problem:  *SIRS (systemic inflammatory response syndrome) Active Problems:  VENTRICULAR TACHYCARDIA  Chronic atrial fibrillation  Chronic systolic heart failure- EF 35-40%  AUTOMATIC IMPLANTABLE CARDIAC DEFIBRILLATOR SITU  CAD (coronary artery disease)  Bradycardia  Anemia, iron deficiency   Ataxia  Dehydration  Hypotension  HCAP (healthcare-associated pneumonia)  Thrombocytopenia  Weakness generalized  Diarrhea  Hypophosphatemia    Time spent: > 35 minutes    Penny Pia  [facility] Pager [phone]. If 8PM-8AM, please contact night-coverage at [email or web address], password [facility] 09/28/2011, 8:32 PM  LOS: 5 days

## 2011-09-29 ENCOUNTER — Inpatient Hospital Stay (HOSPITAL_COMMUNITY): Payer: Medicare Other

## 2011-09-29 DIAGNOSIS — R071 Chest pain on breathing: Secondary | ICD-10-CM

## 2011-09-29 DIAGNOSIS — R0789 Other chest pain: Secondary | ICD-10-CM | POA: Diagnosis not present

## 2011-09-29 DIAGNOSIS — Z7901 Long term (current) use of anticoagulants: Secondary | ICD-10-CM

## 2011-09-29 LAB — CK TOTAL AND CKMB (NOT AT ARMC)
CK, MB: 2.2 ng/mL (ref 0.3–4.0)
Relative Index: INVALID (ref 0.0–2.5)
Total CK: 32 U/L (ref 7–232)

## 2011-09-29 LAB — CULTURE, BLOOD (ROUTINE X 2): Culture: NO GROWTH

## 2011-09-29 LAB — CBC
MCV: 88 fL (ref 78.0–100.0)
Platelets: 184 10*3/uL (ref 150–400)
RBC: 4.25 MIL/uL (ref 4.22–5.81)
RDW: 17.3 % — ABNORMAL HIGH (ref 11.5–15.5)
WBC: 18.2 10*3/uL — ABNORMAL HIGH (ref 4.0–10.5)

## 2011-09-29 LAB — TROPONIN I: Troponin I: <0.3 ng/mL (ref <0.30)

## 2011-09-29 LAB — EXPECTORATED SPUTUM ASSESSMENT W GRAM STAIN, RFLX TO RESP C

## 2011-09-29 MED ORDER — VANCOMYCIN HCL IN DEXTROSE 1-5 GM/200ML-% IV SOLN
1000.0000 mg | Freq: Two times a day (BID) | INTRAVENOUS | Status: DC
  Administered 2011-09-29 – 2011-10-03 (×7): 1000 mg via INTRAVENOUS
  Filled 2011-09-29 (×9): qty 200

## 2011-09-29 MED ORDER — HYDROCODONE-ACETAMINOPHEN 5-325 MG PO TABS
1.0000 | ORAL_TABLET | Freq: Once | ORAL | Status: AC
  Administered 2011-09-29: 1 via ORAL
  Filled 2011-09-29: qty 1

## 2011-09-29 MED ORDER — HYDROCODONE-ACETAMINOPHEN 5-325 MG PO TABS
1.0000 | ORAL_TABLET | Freq: Once | ORAL | Status: AC
  Administered 2011-09-30: 1 via ORAL
  Filled 2011-09-29: qty 1

## 2011-09-29 MED ORDER — CARVEDILOL 6.25 MG PO TABS
6.2500 mg | ORAL_TABLET | Freq: Two times a day (BID) | ORAL | Status: DC
  Administered 2011-09-29 – 2011-10-03 (×8): 6.25 mg via ORAL
  Filled 2011-09-29 (×10): qty 1

## 2011-09-29 MED ORDER — PIPERACILLIN-TAZOBACTAM 3.375 G IVPB
3.3750 g | Freq: Three times a day (TID) | INTRAVENOUS | Status: DC
  Administered 2011-09-29 – 2011-10-01 (×7): 3.375 g via INTRAVENOUS
  Filled 2011-09-29 (×9): qty 50

## 2011-09-29 NOTE — Progress Notes (Signed)
Pt c/o Right sided chest pain and increased dyspnea with exersion, currently on 3L via [place]. States the pain started after he reached down and pulled the handle on the side of the recliner. States the pain gets worse when he takes a deep breath. Notified Programmer, applications (o/c [facility]), orders received. EKG completed 0530 Pt notified RN that he thinks his AICD fired, K. Wellsite geologist notified. Will continue monitoring

## 2011-09-29 NOTE — Progress Notes (Signed)
Event: 0200: Notified by RN pt c/o (R) sided CP upon attempting to get out of chair to return to bed. NP to bedside. Subjective:  Pt reports (R) sided CP that he noticed when he attempted to transfer from chair to bed. States pain is sharp, worse w/ movement and deep breathing. Admits to some slight SOB but admits he is SOB at baseline. States he does not have the pain unless he moves or takes a deep breath. Objective: Upon arrival to bedside pt noted resting in NAD. Resp not objectively labored. BBS diminished over RLL but otherwise CTA.  Pain is reproducible w/ passive ROM of (R) arm. No chest wall TTP. EKG unchanged (A-fib w/ RVR) BP 133/95 P-110, R-18 w/ 02 sats of 97% on 2L [place]. CXR shows  IMPRESSION:  Increased right pleural effusion with associate consolidation;  atelectasis versus pneumonia. There may be trace pleural  fluid/opacity of the left lung base as well.  Right upper lung consolidation is similar to prior.  Original Report Authenticated By: Waneta Martins, M.D. Cardiac panel normal. CK-MB 2.2, CK 32, Trop <0.30. Assessment/Plan: 1. (R) sided CP: musculoskeletal vs pleuritic in nature. EKG unchanged. CXR concerning for worsening PNA, Bil pleural effusions. Discussed pt w/ Dr Toniann Fail. Will get Ct chest w/o cm to r/o emphyema or other acute process. Discussed pt w/ Dr Jerral Ralph as well who is aware of plan.

## 2011-09-29 NOTE — Progress Notes (Signed)
[facility] PROGRESS NOTE  KORREY SCHLEICHER ZOX:[id number] DOB: 03-03-1932 DOA: 09/23/2011 PCP: Hoyle Sauer, MD  Assessment/Plan: Principal Problem:  *SIRS (systemic inflammatory response syndrome) Active Problems:  VENTRICULAR TACHYCARDIA  Chronic atrial fibrillation  Chronic systolic heart failure- EF 35-40%  AUTOMATIC IMPLANTABLE CARDIAC DEFIBRILLATOR SITU  CAD (coronary artery disease)  Bradycardia  Anemia, iron deficiency  Ataxia  Dehydration  Hypotension  HCAP (healthcare-associated pneumonia)  Thrombocytopenia  Weakness generalized  Diarrhea Hypophophatemia  Sepsis - Resolved - Will discontinue stress dosing steroids today 8/28 Solucortef started on 8/23 and today is day five of steroid administration.  Given that he has only had 5 days of steroid therapy he likely has not had adrenal suppresion and it steroids were discontinue 8/28.  1. HAP - I tried to progress patient and change to oral antibiotics (augmentin and azithro) but patient had an increase in WBC count and as such I will place back on Vancomycin and Zosyn.  Antibiotic tailoring has been complicated in that sputum cultures do not yield any information orther than normal oropharyngeal flora and as such no sensitivities are available.   - Will reorder sputum cultures today.  2. Chronic Atrial Fibrillation - Patient is on amiodarone - Will add carvedilol at low dose.  Titrate up if tolerated. - Heart rate up at 114-121 on 8/29 will increase dose of carvedilol to 6.25  3. Dehydration - Likely contributing to elevated heart rates and ARF. - Patient eating and drinking well will d/c IVF's today. - Check BMP next am.  4. Anemia - Stable currently.  No active bleeding.  May have been myelosuppression secondary to recent infection vs chronic anemia.  5. Diarrhea - C diff pcr negative - resolved with fiber supplement - Likely contributed to low phosphorus. - d/c colace  6. Hypophosphatemia -  Likely secondary to # 5 - consult pharmacy for IV replacement.  May be contributing to current heart rate. - Phosphorus within normal limits after IV replacement.    7. ARF - resolved with IV fluid rehydration.  May have been secondary to dehydration and sepsis.   - Check creatinine tomorrow.  8. Chest wall discomfort - At this point reproducible on palpation.   - EKG reviewed with no ST elevations or depressions.  Shows afib at rate of 107 - CT of chest shows RUL consolidation consistent with PNA - Continue with tramadol for pain relief.  Code Status: Full Family Communication: No family at bedside Disposition Plan: Pending continued clinical improvement   Brief narrative: 78 year old male patient with multiple medical problems. Recent hospital admission July 2013 because of subdural hematoma. A CT scan of the chest done during that admission to rule out any form of malignancy demonstrated a moderate right-sided pleural effusion that subsequently resolved. He presented back to the hospital on 09/23/2011 and was found to be hypotensive, volume depleted and had right upper lobe healthcare acquired pneumonia. He was aggressively volume resuscitated and initiated on Zosyn and vancomycin. Because of persistent hypotension and worsening hypoxemia pulmonary critical care medicine assumed care of the patient shortly after admission. Patient did require pressor support briefly but was rapidly weaned. Subsequently he has improved significantly and stabilized enough to transfer out of the ICU to step down on 09/25/2011. [facility] assumed care of the patient on 09/26/2011.  Patient was transferred to 6700 on 8/27 and is currently receiving IV antibiotics. Was tried to transition to oral antibiotics but had increase in WBC and placed back on IV antibiotics.  Chest  discomfort was reported but seems musculoskeletal.  Consultants: Pulmonary critical care medicine-signed off  09/25/2011  Procedures: Right subclavian central line 09/23/2011 >>> plan discontinue 09/26/2011  Antibiotics: 1) Zosyn (09/23/11)  2) Vancomycin (09/23/11)  HPI/Subjective: Patient had some chest discomfort overnight.  Mentions he has right chest discomfort that is sharp and painful to touch.  Denies any increase in SOB.    Objective: Filed Vitals:   09/28/11 1900 09/29/11 0128 09/29/11 0509 09/29/11 1026  BP: 124/76 133/95 150/90 139/90  Pulse: 62 121 111 114  Temp: 97.7 F (36.5 C)  98 F (36.7 C)   TempSrc: Oral  Oral   Resp: 18  18 18   Height:      Weight: 87.7 kg (193 lb 5.5 oz)     SpO2: 92% 97% 92% 90%    Intake/Output Summary (Last 24 hours) at 09/29/11 1107 Last data filed at 09/29/11 1029  Gross per 24 hour  Intake   1151 ml  Output      1 ml  Net   1150 ml   Filed Weights   09/26/11 2031 09/27/11 2027 09/28/11 1900  Weight: 87.5 kg (192 lb 14.4 oz) 87.5 kg (192 lb 14.4 oz) 87.7 kg (193 lb 5.5 oz)    Exam:   General:  Pt in NAD, A and O x 3  Cardiovascular: irregularly irregular, no murmurs  Musculoskeletal:  Pain with palpation over right chest.  Respiratory: Mild rhales at right lung field with decreased BS at right lung base, no Wheezes  Abdomen: Soft, NT, ND  Data Reviewed: Basic Metabolic Panel:  Lab 09/28/11 1610 09/27/11 0655 09/26/11 0525 09/25/11 0405 09/24/11 0500  NA 142 142 136 136 135  K 3.9 3.9 3.9 3.4* 3.6  CL 107 107 105 103 103  CO2 27 23 22 20 19   GLUCOSE 139* 134* 132* 152* 121*  BUN 26* 28* 27* 32* 39*  CREATININE 0.94 0.96 0.95 1.14 1.68*  CALCIUM 8.3* 8.5 8.4 8.6 7.8*  MG -- -- 1.9 1.7 --  PHOS 3.3 -- 1.3* 1.9* --   Liver Function Tests:  Lab 09/24/11 0500 09/23/11 1400  AST 53* 42*  ALT 30 34  ALKPHOS 68 72  BILITOT 0.8 0.8  PROT 5.2* 5.7*  ALBUMIN 2.2* 2.6*    Lab 09/23/11 1400  LIPASE 8*  AMYLASE --   No results found for this basename: AMMONIA:5 in the last 168 hours CBC:  Lab 09/29/11 0535  09/28/11 0530 09/27/11 0655 09/26/11 0525 09/25/11 0405 09/23/11 1400  WBC 18.2* 12.7* 16.7* 21.0* 24.0* --  NEUTROABS -- -- -- -- -- 13.7*  HGB 12.1* 11.4* 11.5* 11.5* 12.5* --  HCT 37.4* 34.4* 34.2* 34.1* 37.2* --  MCV 88.0 88.0 85.7 86.5 85.5 --  PLT 184 152 152 168 175 --   Cardiac Enzymes:  Lab 09/29/11 0535  CKTOTAL 32  CKMB 2.2  CKMBINDEX --  TROPONINI <0.30   BNP (last 3 results)  Basename 08/12/11 1604 11/29/10 0500 11/28/10 1338  PROBNP 422.7 1050.0* 1356.0*   CBG:  Lab 09/26/11 1152 09/26/11 0819 09/26/11 0529 09/26/11 0011 09/25/11 1957  GLUCAP 145* 120* 128* 125* 119*    Recent Results (from the past 240 hour(s))  CULTURE, BLOOD (ROUTINE X 2)     Status: Normal   Collection Time   09/23/11  4:40 PM      Component Value Range Status Comment   Specimen Description BLOOD HAND LEFT   Final    Special Requests BOTTLES DRAWN AEROBIC  ONLY 10CC   Final    Culture  Setup Time 09/23/2011 21:08   Final    Culture NO GROWTH 5 DAYS   Final    Report Status 09/29/2011 FINAL   Final   CULTURE, BLOOD (ROUTINE X 2)     Status: Normal   Collection Time   09/23/11  4:50 PM      Component Value Range Status Comment   Specimen Description BLOOD HAND LEFT   Final    Special Requests BOTTLES DRAWN AEROBIC ONLY 10CC   Final    Culture  Setup Time 09/23/2011 21:08   Final    Culture NO GROWTH 5 DAYS   Final    Report Status 09/29/2011 FINAL   Final   MRSA PCR SCREENING     Status: Abnormal   Collection Time   09/23/11  6:03 PM      Component Value Range Status Comment   MRSA by PCR POSITIVE (*) NEGATIVE Final   URINE CULTURE     Status: Normal   Collection Time   09/23/11  6:39 PM      Component Value Range Status Comment   Specimen Description URINE, CATHETERIZED   Final    Special Requests NONE   Final    Culture  Setup Time 09/23/2011 21:02   Final    Colony Count NO GROWTH   Final    Culture NO GROWTH   Final    Report Status 09/25/2011 FINAL   Final   CULTURE,  EXPECTORATED SPUTUM-ASSESSMENT     Status: Normal   Collection Time   09/23/11  8:39 PM      Component Value Range Status Comment   Specimen Description SPUTUM   Final    Special Requests NONE   Final    Sputum evaluation     Final    Value: THIS SPECIMEN IS ACCEPTABLE. RESPIRATORY CULTURE REPORT TO FOLLOW.   Report Status 09/23/2011 FINAL   Final   CULTURE, RESPIRATORY     Status: Normal   Collection Time   09/23/11  8:39 PM      Component Value Range Status Comment   Specimen Description SPUTUM   Final    Special Requests NONE   Final    Gram Stain     Final    Value: FEW WBC PRESENT,BOTH PMN AND MONONUCLEAR     RARE SQUAMOUS EPITHELIAL CELLS PRESENT     RARE GRAM POSITIVE COCCI     IN PAIRS IN CLUSTERS RARE GRAM POSITIVE RODS   Culture NORMAL OROPHARYNGEAL FLORA   Final    Report Status 09/26/2011 FINAL   Final   CLOSTRIDIUM DIFFICILE BY PCR     Status: Normal   Collection Time   09/24/11 11:56 AM      Component Value Range Status Comment   C difficile by pcr NEGATIVE  NEGATIVE Final      Studies: Dg Chest Port 1 View  09/24/2011  *RADIOLOGY REPORT*  Clinical Data: Right-sided central line placement.  PORTABLE CHEST - 1 VIEW  Comparison: 09/23/2011  Findings: Interval right subclavian approach central venous catheter placement.  The the catheter projects over the SVC however I cannot confirm tip position due to the AICD leads.  Again noted is a right upper lobe consolidation.  Aortic arch atherosclerosis. Heart size upper normal to mildly enlarged.  No interval osseous change.  No pneumothorax.  No definite pleural effusion.  IMPRESSION: Right subclavian approach catheter projects over the SVC, however I cannot  confirm tip position as it is obscured by the AICD leads.  Right upper lobe consolidation is most in keeping with pneumonia. Recommend radiographic follow-up after treatment to document resolution.   Original Report Authenticated By: Waneta Martins, M.D.    Dg Abd Acute  W/chest  09/23/2011  *RADIOLOGY REPORT*  Clinical Data: Weakness, diarrhea, history hypertension, CHF, coronary disease post MI and CABG, asthma, diabetes  ACUTE ABDOMEN SERIES (ABDOMEN 2 VIEW & CHEST 1 VIEW)  Comparison: 08/17/2011  Findings: Left subclavian transvenous AICD leads project over right atrium and right ventricle. Epicardial pacing wires and defibrillator leads present. Upper normal heart size post median sternotomy. Atherosclerotic calcification of a tortuous thoracic aorta. Right upper lobe infiltrate consistent with pneumonia. Remaining lungs clear. Bones demineralized.  Surgical clips right upper quadrant question cholecystectomy. Nonobstructive bowel gas pattern. No bowel dilatation, bowel wall thickening, or free intraperitoneal air. Calcifications project over the kidneys bilaterally question renal calculi. Bilateral pelvic phleboliths and atherosclerotic calcifications. Degenerative changes lumbar spine and bilateral hip joints.  IMPRESSION: Nonobstructive bowel gas pattern. Question bilateral renal calculi. Right upper lobe pneumonia. Due to the more focal parenchymal opacity at the right apex, follow- up chest radiographs until resolution recommended to exclude underlying abnormalities including tumor.   Original Report Authenticated By: Lollie Marrow, M.D.     Scheduled Meds:    . amiodarone  200 mg Oral Daily  . antiseptic oral rinse  15 mL Mouth Rinse BID  . atorvastatin  10 mg Oral q1800  . carvedilol  3.125 mg Oral BID WC  . Chlorhexidine Gluconate Cloth  6 each Topical Q0600  . cycloSPORINE  1 drop Both Eyes BID  . dexlansoprazole  60 mg Oral Q1200  . HYDROcodone-acetaminophen  1 tablet Oral Once  . mupirocin ointment  1 application Nasal BID  . omega-3 acid ethyl esters  1 g Oral BID  . psyllium  1 packet Oral Daily  . sodium chloride  3 mL Intravenous Q12H  . DISCONTD: amoxicillin-clavulanate  1 tablet Oral Q12H  . DISCONTD: azithromycin  500 mg Oral Q24H  .  DISCONTD: hydrocortisone sod succinate (SOLU-CORTEF) injection  25 mg Intravenous Q12H  . DISCONTD: piperacillin-tazobactam (ZOSYN)  IV  3.375 g Intravenous Q8H  . DISCONTD: vancomycin  1,000 mg Intravenous BID   Continuous Infusions:    . sodium chloride 75 mL/hr at 09/29/11 0201    Principal Problem:  *SIRS (systemic inflammatory response syndrome) Active Problems:  VENTRICULAR TACHYCARDIA  Chronic atrial fibrillation  Chronic systolic heart failure- EF 35-40%  AUTOMATIC IMPLANTABLE CARDIAC DEFIBRILLATOR SITU  CAD (coronary artery disease)  Bradycardia  Anemia, iron deficiency  Ataxia  Dehydration  Hypotension  HCAP (healthcare-associated pneumonia)  Thrombocytopenia  Weakness generalized  Diarrhea  Hypophosphatemia    Time spent: > 35 minutes    Penny Pia  [facility] Pager [phone]. If 8PM-8AM, please contact night-coverage at [email or web address], password [facility] 09/29/2011, 11:07 AM  LOS: 6 days

## 2011-09-29 NOTE — Progress Notes (Signed)
ANTIBIOTIC CONSULT NOTE - INITIAL  Pharmacy Consult for Vancomycin and Zosyn Indication: pneumonia  Allergies  Allergen Reactions  . Celebrex (Celecoxib) Hives and Other (See Comments)    Gi upset  . Digoxin Other (See Comments)    Unknown   . Esomeprazole Magnesium Hives    "don't really remember"  . Multaq (Dronedarone Hydrochloride) Other (See Comments)    "don't remember"  . Protonix (Pantoprazole Sodium) Nausea And Vomiting    Tolerates Dexilant    Patient Measurements: Height: 6' (182.9 cm) Weight: 193 lb 5.5 oz (87.7 kg) IBW/kg (Calculated) : 77.6   Vital Signs: Temp: 98 F (36.7 C) (08/29 0509) Temp src: Oral (08/29 0509) BP: 139/90 mmHg (08/29 1026) Pulse Rate: 114  (08/29 1026) Intake/Output from previous day: 08/28 0701 - 08/29 0700 In: 1271 [P.O.:480; I.V.:791] Out: 1 [Stool:1] Intake/Output from this shift: Total I/O In: 120 [P.O.:120] Out: -   Labs:  Basename 09/29/11 0535 09/28/11 0530 09/27/11 0655  WBC 18.2* 12.7* 16.7*  HGB 12.1* 11.4* 11.5*  PLT 184 152 152  LABCREA -- -- --  CREATININE -- 0.94 0.96   Estimated Creatinine Clearance: 73.4 ml/min (by C-G formula based on Cr of 0.94).  Basename 09/28/11 0941  VANCOTROUGH 18.0  VANCOPEAK --  Drue Dun --  GENTTROUGH --  GENTPEAK --  GENTRANDOM --  TOBRATROUGH --  TOBRAPEAK --  TOBRARND --  AMIKACINPEAK --  AMIKACINTROU --  AMIKACIN --     Microbiology: Recent Results (from the past 720 hour(s))  CULTURE, BLOOD (ROUTINE X 2)     Status: Normal   Collection Time   09/23/11  4:40 PM      Component Value Range Status Comment   Specimen Description BLOOD HAND LEFT   Final    Special Requests BOTTLES DRAWN AEROBIC ONLY 10CC   Final    Culture  Setup Time 09/23/2011 21:08   Final    Culture NO GROWTH 5 DAYS   Final    Report Status 09/29/2011 FINAL   Final   CULTURE, BLOOD (ROUTINE X 2)     Status: Normal   Collection Time   09/23/11  4:50 PM      Component Value Range Status  Comment   Specimen Description BLOOD HAND LEFT   Final    Special Requests BOTTLES DRAWN AEROBIC ONLY 10CC   Final    Culture  Setup Time 09/23/2011 21:08   Final    Culture NO GROWTH 5 DAYS   Final    Report Status 09/29/2011 FINAL   Final   MRSA PCR SCREENING     Status: Abnormal   Collection Time   09/23/11  6:03 PM      Component Value Range Status Comment   MRSA by PCR POSITIVE (*) NEGATIVE Final   URINE CULTURE     Status: Normal   Collection Time   09/23/11  6:39 PM      Component Value Range Status Comment   Specimen Description URINE, CATHETERIZED   Final    Special Requests NONE   Final    Culture  Setup Time 09/23/2011 21:02   Final    Colony Count NO GROWTH   Final    Culture NO GROWTH   Final    Report Status 09/25/2011 FINAL   Final   CULTURE, EXPECTORATED SPUTUM-ASSESSMENT     Status: Normal   Collection Time   09/23/11  8:39 PM      Component Value Range Status Comment   Specimen Description  SPUTUM   Final    Special Requests NONE   Final    Sputum evaluation     Final    Value: THIS SPECIMEN IS ACCEPTABLE. RESPIRATORY CULTURE REPORT TO FOLLOW.   Report Status 09/23/2011 FINAL   Final   CULTURE, RESPIRATORY     Status: Normal   Collection Time   09/23/11  8:39 PM      Component Value Range Status Comment   Specimen Description SPUTUM   Final    Special Requests NONE   Final    Gram Stain     Final    Value: FEW WBC PRESENT,BOTH PMN AND MONONUCLEAR     RARE SQUAMOUS EPITHELIAL CELLS PRESENT     RARE GRAM POSITIVE COCCI     IN PAIRS IN CLUSTERS RARE GRAM POSITIVE RODS   Culture NORMAL OROPHARYNGEAL FLORA   Final    Report Status 09/26/2011 FINAL   Final   CLOSTRIDIUM DIFFICILE BY PCR     Status: Normal   Collection Time   09/24/11 11:56 AM      Component Value Range Status Comment   C difficile by pcr NEGATIVE  NEGATIVE Final     Anti-infectives     Start     Dose/Rate Route Frequency Ordered Stop   09/28/11 2200   amoxicillin-clavulanate (AUGMENTIN)  875-125 MG per tablet 1 tablet  Status:  Discontinued        1 tablet Oral Every 12 hours 09/28/11 2032 09/29/11 1106   09/28/11 2200   azithromycin (ZITHROMAX) tablet 500 mg  Status:  Discontinued        500 mg Oral Every 24 hours 09/28/11 2032 09/29/11 1106   09/25/11 1100   vancomycin (VANCOCIN) IVPB 1000 mg/200 mL premix  Status:  Discontinued        1,000 mg 200 mL/hr over 60 Minutes Intravenous 2 times daily 09/25/11 1046 09/28/11 2031   09/24/11 2000   vancomycin (VANCOCIN) IVPB 1000 mg/200 mL premix  Status:  Discontinued        1,000 mg 200 mL/hr over 60 Minutes Intravenous Every 24 hours 09/23/11 1818 09/25/11 1046   09/24/11 1400   piperacillin-tazobactam (ZOSYN) IVPB 3.375 g  Status:  Discontinued        3.375 g 12.5 mL/hr over 240 Minutes Intravenous 3 times per day 09/24/11 1131 09/28/11 2031   09/23/11 2359   piperacillin-tazobactam (ZOSYN) IVPB 2.25 g  Status:  Discontinued        2.25 g 100 mL/hr over 30 Minutes Intravenous 4 times per day 09/23/11 1756 09/24/11 1131   09/23/11 1815   piperacillin-tazobactam (ZOSYN) IVPB 3.375 g        3.375 g 100 mL/hr over 30 Minutes Intravenous  Once 09/23/11 1812 09/23/11 1848   09/23/11 1530   vancomycin (VANCOCIN) IVPB 1000 mg/200 mL premix        1,000 mg 200 mL/hr over 60 Minutes Intravenous  Once 09/23/11 1522 09/23/11 1920   09/23/11 1530   piperacillin-tazobactam (ZOSYN) IVPB 3.375 g  Status:  Discontinued        3.375 g 12.5 mL/hr over 240 Minutes Intravenous  Once 09/23/11 1522 09/23/11 1812          Assessment: 78 y.o. M originally on Vancomycin + Zosyn from 8/23 to 8/28 (6 days) for empiric HCAP coverage. This was de-escalated to Augmentin + Azithromycin on 8/28. On 8/29 the patient was noted to have worsening SOB and CP -- so Vanc + Zosyn was re-ordered  for empiric PNA coverage.   The patient produced a therapeutic trough level of a Vancomycin dose of 1g every 12 hours -- will resume at this dose. Old  cultures (blood, sputum, urine) drawn on 8/23 show NG. A new sputum culture has been ordered this morning. No BMET from today, however renal function from 8/28 appeared stable.   Goal of Therapy:  Vancomycin trough level 15-20 mcg/ml  Plan:  1. Vancomycin 1g IV every 12 hours 2. Zosyn 3.375g IV every 8 hours 3. Will continue to follow renal function, culture results, LOT, and antibiotic de-escalation plans   Georgina Pillion, PharmD, BCPS Clinical Pharmacist Pager: [phone] 09/29/2011 11:11 AM

## 2011-09-30 DIAGNOSIS — I5023 Acute on chronic systolic (congestive) heart failure: Secondary | ICD-10-CM | POA: Diagnosis present

## 2011-09-30 LAB — BASIC METABOLIC PANEL
Calcium: 8.2 mg/dL — ABNORMAL LOW (ref 8.4–10.5)
GFR calc Af Amer: >90 mL/min (ref >90)
GFR calc non Af Amer: 82 mL/min — ABNORMAL LOW (ref >90)
Glucose, Bld: 103 mg/dL — ABNORMAL HIGH (ref 70–99)
Potassium: 3.4 mEq/L — ABNORMAL LOW (ref 3.5–5.1)
Sodium: 141 mEq/L (ref 135–145)

## 2011-09-30 LAB — CBC
Hemoglobin: 11.9 g/dL — ABNORMAL LOW (ref 13.0–17.0)
MCH: 28.7 pg (ref 26.0–34.0)
Platelets: 179 10*3/uL (ref 150–400)
RBC: 4.15 MIL/uL — ABNORMAL LOW (ref 4.22–5.81)
WBC: 21.9 10*3/uL — ABNORMAL HIGH (ref 4.0–10.5)

## 2011-09-30 MED ORDER — POTASSIUM CHLORIDE CRYS ER 20 MEQ PO TBCR
40.0000 meq | EXTENDED_RELEASE_TABLET | Freq: Once | ORAL | Status: AC
  Administered 2011-09-30: 40 meq via ORAL
  Filled 2011-09-30: qty 2

## 2011-09-30 MED ORDER — ACETAMINOPHEN 325 MG PO TABS
650.0000 mg | ORAL_TABLET | Freq: Four times a day (QID) | ORAL | Status: DC | PRN
  Administered 2011-09-30 – 2011-10-10 (×3): 650 mg via ORAL
  Filled 2011-09-30 (×2): qty 2
  Filled 2011-09-30: qty 1

## 2011-09-30 MED ORDER — FUROSEMIDE 10 MG/ML IJ SOLN
40.0000 mg | Freq: Once | INTRAMUSCULAR | Status: AC
  Administered 2011-09-30: 40 mg via INTRAVENOUS
  Filled 2011-09-30: qty 4

## 2011-09-30 MED ORDER — FLUCONAZOLE 100MG IVPB
100.0000 mg | INTRAVENOUS | Status: DC
  Administered 2011-09-30 – 2011-10-03 (×4): 100 mg via INTRAVENOUS
  Filled 2011-09-30 (×5): qty 50

## 2011-09-30 NOTE — Progress Notes (Signed)
SATURATION QUALIFICATIONS:  Patient Saturations on Room Air at Rest = 86%

## 2011-09-30 NOTE — Progress Notes (Addendum)
[facility] PROGRESS NOTE  [name] ZOX:[id number] DOB: 12/29/1932 DOA: 09/23/2011 PCP: Hoyle Sauer, MD  Assessment/Plan: Principal Problem:  *SIRS (systemic inflammatory response syndrome) Active Problems:  VENTRICULAR TACHYCARDIA  Chronic atrial fibrillation  Chronic systolic heart failure- EF 35-40%  AUTOMATIC IMPLANTABLE CARDIAC DEFIBRILLATOR SITU  CAD (coronary artery disease)  Bradycardia  Anemia, iron deficiency  Ataxia  Dehydration  History of subdural hemorrhage  Hypotension  HCAP (healthcare-associated pneumonia)  Thrombocytopenia  Weakness generalized  Diarrhea  Hypophosphatemia  Chest wall discomfort  Acute on chronic systolic CHF (congestive heart failure)  Acute on chronic systolic CHF - pulmonary edema - volume overload - stopped iv fluids 8/30 - started iv lasix 8/30  Sepsis - Resolved after boluses of iv fluids and stress dose steroids given on admission.  - Discontinued stress dose steroids on 8/28.  Given that he has only had 5 days of steroid therapy he likely has not had adrenal suppresion and hence steroids were discontinue 8/28.  HAP: - patient was placed on iv abx on admission  He did develop resp failure, severe hypoxia, and had to have ICU stay - he has improved gradually. The pleural efussion is felt to be chronic based on Ct findings.  - needs outpt CXR f/u to assure resolution of infiltrate   2. Chronic Atrial Fibrillation - Patient is on amiodarone - Will add carvedilol at low dose.  Titrate up if tolerated. - Heart rate up at 114-121 on 8/29 increased dose of carvedilol to 6.25 - NOT a COUMADIN candidate due to spontaneous SDH in 08/2011  3. Dehydration on admission  - Likely contributing to elevated heart rates and ARF. - Patient eating and drinking well so iv fluids were discontinued 8/30  4. Anemia - Stable currently.  No active bleeding.  May have been myelosuppression secondary to recent infection vs chronic  anemia.  5. Diarrhea - C diff pcr negative 8/24 - Likely contributed to low phosphorus. - no significant amounts noted during hospitalization    6. Hypophosphatemia - Likely secondary to # 5 repleted iv   7. ARF - resolved with IV fluid rehydration.  May have been secondary to dehydration and sepsis.     8. Chest wall discomfort - At this point reproducible on palpation.   - EKG reviewed with no ST elevations or depressions.  Shows afib at rate of 107 - CT of chest shows RUL consolidation consistent with PNA - Continue with tramadol for pain relief.  9. AICD - patient thinks the device has fired recently - will ask rep to interrogate    Code Status: Full Family CommunicationHayden Rasmussen Daughter [phone] Rollene Rotunda Relative [phone]  Disposition Plan: home with home health    Brief narrative: 78 year old male patient with multiple medical problems. Recent hospital admission July 2013 because of subdural hematoma. A CT scan of the chest done during that admission to rule out any form of malignancy demonstrated a moderate right-sided pleural effusion that subsequently resolved. He presented back to the hospital on 09/23/2011 and was found to be hypotensive, volume depleted and had right upper lobe healthcare acquired pneumonia. He was aggressively volume resuscitated and initiated on Zosyn and vancomycin. Because of persistent hypotension and worsening hypoxemia pulmonary critical care medicine assumed care of the patient shortly after admission. Patient did require pressor support briefly but was rapidly weaned. Subsequently he has improved significantly and stabilized enough to transfer out of the ICU to step down on 09/25/2011. [facility] assumed care of the  patient on 09/26/2011.  Patient was transferred to 6700 on 8/27 and is currently receiving IV antibiotics. Was tried to transition to oral antibiotics but had increase in WBC and placed  back on IV antibiotics.  Chest discomfort was reported but seems musculoskeletal.  Consultants: Pulmonary critical care medicine-signed off 09/25/2011  Procedures: Right subclavian central line 09/23/2011 >>> plan discontinue 09/26/2011  Antibiotics: 1) Zosyn (09/23/11) -8/28 then 8/29- 2) Vancomycin (09/23/11) -8/28 then 8/29- 3)Augmentin 8/28-8/29 4)Zithromax 8/28-8/29  HPI/Subjective:   C/w dyspnea   Objective: Filed Vitals:   09/29/11 1831 09/29/11 2130 09/30/11 0440 09/30/11 1000  BP: 114/78 133/83 107/60 99/66  Pulse: 100 108 86 79  Temp: 98.1 F (36.7 C) 98.2 F (36.8 C) 97.3 F (36.3 C) 97.6 F (36.4 C)  TempSrc: Oral Oral Oral Oral  Resp: 18 18 18 30   Height:      Weight:  89.858 kg (198 lb 1.6 oz)    SpO2: 94% 92% 95% 93%    Intake/Output Summary (Last 24 hours) at 09/30/11 1018 Last data filed at 09/29/11 1700  Gross per 24 hour  Intake   1047 ml  Output      0 ml  Net   1047 ml   Filed Weights   09/27/11 2027 09/28/11 1900 09/29/11 2130  Weight: 87.5 kg (192 lb 14.4 oz) 87.7 kg (193 lb 5.5 oz) 89.858 kg (198 lb 1.6 oz)    Exam:   General:  Pt in NAD, A and O x 3  Cardiovascular: irregularly irregular,tachycardic no murmurs  Musculoskeletal:  Pain with palpation over right chest.  Respiratory: Mild rhales at right lung field with decreased BS at right lung base, no Wheezes, sputum clear   Abdomen: Soft, NT, ND  Extremities with plus 4 edema - widespread   Data Reviewed: Basic Metabolic Panel:  Lab 09/30/11 1610 09/28/11 0530 09/27/11 0655 09/26/11 0525 09/25/11 0405  NA 141 142 142 136 136  K 3.4* 3.9 3.9 3.9 3.4*  CL 105 107 107 105 103  CO2 28 27 23 22 20   GLUCOSE 103* 139* 134* 132* 152*  BUN 16 26* 28* 27* 32*  CREATININE 0.87 0.94 0.96 0.95 1.14  CALCIUM 8.2* 8.3* 8.5 8.4 8.6  MG -- -- -- 1.9 1.7  PHOS -- 3.3 -- 1.3* 1.9*   Liver Function Tests:  Lab 09/24/11 0500 09/23/11 1400  AST 53* 42*  ALT 30 34  ALKPHOS 68 72    BILITOT 0.8 0.8  PROT 5.2* 5.7*  ALBUMIN 2.2* 2.6*    Lab 09/23/11 1400  LIPASE 8*  AMYLASE --   No results found for this basename: AMMONIA:5 in the last 168 hours CBC:  Lab 09/29/11 0535 09/28/11 0530 09/27/11 0655 09/26/11 0525 09/25/11 0405 09/23/11 1400  WBC 18.2* 12.7* 16.7* 21.0* 24.0* --  NEUTROABS -- -- -- -- -- 13.7*  HGB 12.1* 11.4* 11.5* 11.5* 12.5* --  HCT 37.4* 34.4* 34.2* 34.1* 37.2* --  MCV 88.0 88.0 85.7 86.5 85.5 --  PLT 184 152 152 168 175 --   Cardiac Enzymes:  Lab 09/29/11 0535  CKTOTAL 32  CKMB 2.2  CKMBINDEX --  TROPONINI <0.30   BNP (last 3 results)  Basename 08/12/11 1604 11/29/10 0500 11/28/10 1338  PROBNP 422.7 1050.0* 1356.0*   CBG:  Lab 09/26/11 1152 09/26/11 0819 09/26/11 0529 09/26/11 0011 09/25/11 1957  GLUCAP 145* 120* 128* 125* 119*    Recent Results (from the past 240 hour(s))  CULTURE, BLOOD (ROUTINE X 2)  Status: Normal   Collection Time   09/23/11  4:40 PM      Component Value Range Status Comment   Specimen Description BLOOD HAND LEFT   Final    Special Requests BOTTLES DRAWN AEROBIC ONLY 10CC   Final    Culture  Setup Time 09/23/2011 21:08   Final    Culture NO GROWTH 5 DAYS   Final    Report Status 09/29/2011 FINAL   Final   CULTURE, BLOOD (ROUTINE X 2)     Status: Normal   Collection Time   09/23/11  4:50 PM      Component Value Range Status Comment   Specimen Description BLOOD HAND LEFT   Final    Special Requests BOTTLES DRAWN AEROBIC ONLY 10CC   Final    Culture  Setup Time 09/23/2011 21:08   Final    Culture NO GROWTH 5 DAYS   Final    Report Status 09/29/2011 FINAL   Final   MRSA PCR SCREENING     Status: Abnormal   Collection Time   09/23/11  6:03 PM      Component Value Range Status Comment   MRSA by PCR POSITIVE (*) NEGATIVE Final   URINE CULTURE     Status: Normal   Collection Time   09/23/11  6:39 PM      Component Value Range Status Comment   Specimen Description URINE, CATHETERIZED   Final     Special Requests NONE   Final    Culture  Setup Time 09/23/2011 21:02   Final    Colony Count NO GROWTH   Final    Culture NO GROWTH   Final    Report Status 09/25/2011 FINAL   Final   CULTURE, EXPECTORATED SPUTUM-ASSESSMENT     Status: Normal   Collection Time   09/23/11  8:39 PM      Component Value Range Status Comment   Specimen Description SPUTUM   Final    Special Requests NONE   Final    Sputum evaluation     Final    Value: THIS SPECIMEN IS ACCEPTABLE. RESPIRATORY CULTURE REPORT TO FOLLOW.   Report Status 09/23/2011 FINAL   Final   CULTURE, RESPIRATORY     Status: Normal   Collection Time   09/23/11  8:39 PM      Component Value Range Status Comment   Specimen Description SPUTUM   Final    Special Requests NONE   Final    Gram Stain     Final    Value: FEW WBC PRESENT,BOTH PMN AND MONONUCLEAR     RARE SQUAMOUS EPITHELIAL CELLS PRESENT     RARE GRAM POSITIVE COCCI     IN PAIRS IN CLUSTERS RARE GRAM POSITIVE RODS   Culture NORMAL OROPHARYNGEAL FLORA   Final    Report Status 09/26/2011 FINAL   Final   CLOSTRIDIUM DIFFICILE BY PCR     Status: Normal   Collection Time   09/24/11 11:56 AM      Component Value Range Status Comment   C difficile by pcr NEGATIVE  NEGATIVE Final   CULTURE, EXPECTORATED SPUTUM-ASSESSMENT     Status: Normal   Collection Time   09/29/11  1:15 PM      Component Value Range Status Comment   Specimen Description Expect. Sput   Final    Special Requests Normal   Final    Sputum evaluation     Final    Value: THIS SPECIMEN IS ACCEPTABLE.  RESPIRATORY CULTURE REPORT TO FOLLOW.   Report Status 09/29/2011 FINAL   Final   CULTURE, RESPIRATORY     Status: Normal (Preliminary result)   Collection Time   09/29/11  1:15 PM      Component Value Range Status Comment   Specimen Description SPUTUM   Final    Special Requests NONE   Final    Gram Stain PENDING   Incomplete    Culture FEW YEAST CONSISTENT WITH CANDIDA SPECIES   Final    Report Status PENDING    Incomplete      Studies: Dg Chest Port 1 View  09/24/2011  *RADIOLOGY REPORT*  Clinical Data: Right-sided central line placement.  PORTABLE CHEST - 1 VIEW  Comparison: 09/23/2011  Findings: Interval right subclavian approach central venous catheter placement.  The the catheter projects over the SVC however I cannot confirm tip position due to the AICD leads.  Again noted is a right upper lobe consolidation.  Aortic arch atherosclerosis. Heart size upper normal to mildly enlarged.  No interval osseous change.  No pneumothorax.  No definite pleural effusion.  IMPRESSION: Right subclavian approach catheter projects over the SVC, however I cannot confirm tip position as it is obscured by the AICD leads.  Right upper lobe consolidation is most in keeping with pneumonia. Recommend radiographic follow-up after treatment to document resolution.   Original Report Authenticated By: Waneta Martins, M.D.    Dg Abd Acute W/chest  09/23/2011  *RADIOLOGY REPORT*  Clinical Data: Weakness, diarrhea, history hypertension, CHF, coronary disease post MI and CABG, asthma, diabetes  ACUTE ABDOMEN SERIES (ABDOMEN 2 VIEW & CHEST 1 VIEW)  Comparison: 08/17/2011  Findings: Left subclavian transvenous AICD leads project over right atrium and right ventricle. Epicardial pacing wires and defibrillator leads present. Upper normal heart size post median sternotomy. Atherosclerotic calcification of a tortuous thoracic aorta. Right upper lobe infiltrate consistent with pneumonia. Remaining lungs clear. Bones demineralized.  Surgical clips right upper quadrant question cholecystectomy. Nonobstructive bowel gas pattern. No bowel dilatation, bowel wall thickening, or free intraperitoneal air. Calcifications project over the kidneys bilaterally question renal calculi. Bilateral pelvic phleboliths and atherosclerotic calcifications. Degenerative changes lumbar spine and bilateral hip joints.  IMPRESSION: Nonobstructive bowel gas pattern.  Question bilateral renal calculi. Right upper lobe pneumonia. Due to the more focal parenchymal opacity at the right apex, follow- up chest radiographs until resolution recommended to exclude underlying abnormalities including tumor.   Original Report Authenticated By: Lollie Marrow, M.D.     Scheduled Meds:    . amiodarone  200 mg Oral Daily  . antiseptic oral rinse  15 mL Mouth Rinse BID  . atorvastatin  10 mg Oral q1800  . carvedilol  6.25 mg Oral BID WC  . Chlorhexidine Gluconate Cloth  6 each Topical Q0600  . cycloSPORINE  1 drop Both Eyes BID  . dexlansoprazole  60 mg Oral Q1200  . furosemide  40 mg Intravenous Once  . HYDROcodone-acetaminophen  1 tablet Oral Once  . mupirocin ointment  1 application Nasal BID  . omega-3 acid ethyl esters  1 g Oral BID  . piperacillin-tazobactam (ZOSYN)  IV  3.375 g Intravenous Q8H  . potassium chloride  40 mEq Oral Once  . psyllium  1 packet Oral Daily  . sodium chloride  3 mL Intravenous Q12H  . vancomycin  1,000 mg Intravenous Q12H  . DISCONTD: amoxicillin-clavulanate  1 tablet Oral Q12H  . DISCONTD: azithromycin  500 mg Oral Q24H  . DISCONTD: carvedilol  3.125 mg Oral BID WC   Continuous Infusions:    . DISCONTD: sodium chloride 75 mL/hr at 09/29/11 1511    Principal Problem:  *SIRS (systemic inflammatory response syndrome) Active Problems:  VENTRICULAR TACHYCARDIA  Chronic atrial fibrillation  Chronic systolic heart failure- EF 35-40%  AUTOMATIC IMPLANTABLE CARDIAC DEFIBRILLATOR SITU  CAD (coronary artery disease)  Bradycardia  Anemia, iron deficiency  Ataxia  Dehydration  History of subdural hemorrhage  Hypotension  HCAP (healthcare-associated pneumonia)  Thrombocytopenia  Weakness generalized  Diarrhea  Hypophosphatemia  Chest wall discomfort  Acute on chronic systolic CHF (congestive heart failure)        Emanuel Dowson  [facility] Pager [phone]. If 8PM-8AM, please contact night-coverage at  [email or web address], password [facility] 09/30/2011, 10:18 AM  LOS: 7 days

## 2011-09-30 NOTE — Progress Notes (Signed)
PT Cancellation Note  Treatment cancelled today due to patient's refusal to participate.Pt. Sitting up in recliner and declining PT at this time.  Ferman Hamming 09/30/2011, 1:59 PM Weldon Picking PT Acute Rehab Services [phone] [place] [phone]

## 2011-10-01 LAB — CLOSTRIDIUM DIFFICILE BY PCR: Toxigenic C. Difficile by PCR: NEGATIVE

## 2011-10-01 LAB — CULTURE, RESPIRATORY W GRAM STAIN

## 2011-10-01 LAB — BASIC METABOLIC PANEL
CO2: 28 mEq/L (ref 19–32)
GFR calc non Af Amer: 81 mL/min — ABNORMAL LOW (ref >90)
Glucose, Bld: 103 mg/dL — ABNORMAL HIGH (ref 70–99)
Potassium: 3.6 mEq/L (ref 3.5–5.1)
Sodium: 140 mEq/L (ref 135–145)

## 2011-10-01 LAB — CBC
Hemoglobin: 11.1 g/dL — ABNORMAL LOW (ref 13.0–17.0)
MCHC: 32.1 g/dL (ref 30.0–36.0)
Platelets: 188 10*3/uL (ref 150–400)
RBC: 3.93 MIL/uL — ABNORMAL LOW (ref 4.22–5.81)

## 2011-10-01 MED ORDER — FUROSEMIDE 10 MG/ML IJ SOLN
40.0000 mg | Freq: Once | INTRAMUSCULAR | Status: AC
  Administered 2011-10-01: 40 mg via INTRAVENOUS
  Filled 2011-10-01: qty 4

## 2011-10-01 MED ORDER — PIPERACILLIN-TAZOBACTAM 3.375 G IVPB
3.3750 g | Freq: Three times a day (TID) | INTRAVENOUS | Status: DC
  Administered 2011-10-01 – 2011-10-04 (×7): 3.375 g via INTRAVENOUS
  Filled 2011-10-01 (×11): qty 50

## 2011-10-01 MED ORDER — METRONIDAZOLE IN NACL 5-0.79 MG/ML-% IV SOLN
500.0000 mg | Freq: Three times a day (TID) | INTRAVENOUS | Status: DC
  Administered 2011-10-01: 500 mg via INTRAVENOUS
  Filled 2011-10-01 (×3): qty 100

## 2011-10-01 MED ORDER — METRONIDAZOLE IN NACL 5-0.79 MG/ML-% IV SOLN
500.0000 mg | Freq: Three times a day (TID) | INTRAVENOUS | Status: DC
  Administered 2011-10-02 – 2011-10-04 (×6): 500 mg via INTRAVENOUS
  Filled 2011-10-01 (×11): qty 100

## 2011-10-01 MED ORDER — HYDROCODONE-ACETAMINOPHEN 5-325 MG PO TABS
1.0000 | ORAL_TABLET | Freq: Three times a day (TID) | ORAL | Status: DC
  Administered 2011-10-01 – 2011-10-11 (×31): 1 via ORAL
  Filled 2011-10-01 (×31): qty 1

## 2011-10-01 NOTE — Evaluation (Signed)
Physical Therapy Evaluation Patient Details Name: [name] MRN: [id number] DOB: 01/19/1933 Today's Date: 10/01/2011 Time: 8413-2440 PT Time Calculation (min): 16 min  PT Assessment / Plan / Recommendation Clinical Impression  patient did well with mobility, requiring supervision for most tasks, min assist for bed mobility.  Patient used cane prior to admission and will need continued therapy to return to cane, currently requires RW.  Feel patient will make steady gains.      PT Assessment  Patient needs continued PT services    Follow Up Recommendations  Home health PT;Supervision - Intermittent    Barriers to Discharge Decreased caregiver support      Equipment Recommendations  None recommended by PT    Recommendations for Other Services     Frequency Min 3X/week    Precautions / Restrictions Precautions Precautions: Fall   Pertinent Vitals/Pain No pain indicated      Mobility  Bed Mobility Bed Mobility: Sit to Supine Sit to Supine: 4: Min assist;HOB elevated Details for Bed Mobility Assistance: min assist for LE's Transfers Transfers: Sit to Stand;Stand to Sit Sit to Stand: 5: Supervision;From bed;With upper extremity assist Stand to Sit: 5: Supervision;To bed;With upper extremity assist Details for Transfer Assistance: supervision for safety and to manage lines Ambulation/Gait Ambulation/Gait Assistance: 5: Supervision Ambulation Distance (Feet): 30 Feet Assistive device: Rolling walker Ambulation/Gait Assistance Details: supervision for safety and to manage lines Gait Pattern: Step-through pattern Gait velocity: decreased    Exercises Total Joint Exercises Ankle Circles/Pumps: AROM;Both;10 reps   PT Diagnosis: Generalized weakness  PT Problem List: Decreased activity tolerance;Cardiopulmonary status limiting activity;Decreased mobility PT Treatment Interventions: DME instruction;Gait training;Functional mobility training;Therapeutic  activities;Therapeutic exercise;Balance training;Patient/family education   PT Goals Acute Rehab PT Goals PT Goal Formulation: With patient Time For Goal Achievement: 10/08/11 Potential to Achieve Goals: Good Pt will go Supine/Side to Sit: Independently;with HOB 0 degrees PT Goal: Supine/Side to Sit - Progress: Goal set today Pt will go Sit to Supine/Side: Independently;with HOB 0 degrees PT Goal: Sit to Supine/Side - Progress: Goal set today Pt will go Sit to Stand: with modified independence;with upper extremity assist PT Goal: Sit to Stand - Progress: Goal set today Pt will go Stand to Sit: with modified independence;with upper extremity assist PT Goal: Stand to Sit - Progress: Goal set today Pt will Ambulate: 51 - 150 feet;with modified independence;with least restrictive assistive device PT Goal: Ambulate - Progress: Goal set today  Visit Information  Last PT Received On: 10/01/11 Assistance Needed: +1    Subjective Data  Subjective: Patient reports he hopes to go home on Sunday or Monday Patient Stated Goal: go home   Prior Functioning  Home Living Lives With: Alone Type of Home: Apartment (town home) Home Access: Stairs to enter Secretary/administrator of Steps: none Home Layout: One level Home Adaptive Equipment: Environmental consultant - four wheeled;Quad cane Prior Function Level of Independence: Independent Communication Communication: No difficulties    Cognition  Overall Cognitive Status: Appears within functional limits for tasks assessed/performed Arousal/Alertness: Awake/alert Orientation Level: Appears intact for tasks assessed Behavior During Session: [facility] for tasks performed    Extremity/Trunk Assessment Right Upper Extremity Assessment RUE ROM/Strength/Tone: [facility] for tasks assessed Left Upper Extremity Assessment LUE ROM/Strength/Tone: [facility] for tasks assessed Right Lower Extremity Assessment RLE ROM/Strength/Tone: [facility] for tasks assessed (increased edema RLE) Left  Lower Extremity Assessment LLE ROM/Strength/Tone: WFL for tasks assessed (increased edema LLE)   Balance Balance Balance Assessed: No  End of Session PT - End of Session Equipment  Utilized During Treatment: Gait belt Activity Tolerance: Patient tolerated treatment well Patient left: in bed;with call bell/phone within reach  GP     Olivia Canter, [place] [phone] 10/01/2011, 12:29 PM

## 2011-10-01 NOTE — Progress Notes (Signed)
[facility] PROGRESS NOTE  [name] RUE:[id number] DOB: 08/10/1932 DOA: 09/23/2011 PCP: Hoyle Sauer, MD  Assessment/Plan: Principal Problem:  *SIRS (systemic inflammatory response syndrome) Active Problems:  VENTRICULAR TACHYCARDIA  Chronic atrial fibrillation  Chronic systolic heart failure- EF 35-40%  AUTOMATIC IMPLANTABLE CARDIAC DEFIBRILLATOR SITU  CAD (coronary artery disease)  Bradycardia  Anemia, iron deficiency  Ataxia  Dehydration  History of subdural hemorrhage  Hypotension  HCAP (healthcare-associated pneumonia)  Thrombocytopenia  Weakness generalized  Diarrhea  Hypophosphatemia  Chest wall discomfort  Acute on chronic systolic CHF (congestive heart failure)  Acute on chronic systolic CHF - pulmonary edema - volume overload - stopped iv fluids 8/30 - started iv lasix 8/30  Sepsis - Resolved after boluses of iv fluids and stress dose steroids given on admission.  - Discontinued stress dose steroids on 8/28.  Given that he has only had 5 days of steroid therapy he likely has not had adrenal suppresion and hence steroids were discontinue 8/28.  HAP: - patient was placed on iv abx on admission  He did develop resp failure, severe hypoxia, and had to have ICU stay - he has improved gradually. The pleural efussion is felt to be chronic based on Ct findings.  - needs outpt CXR f/u to assure resolution of infiltrate  - CT scan with RUL mass - suspected pneumonia but we may need to r/o malignancy if he does not improve   2. Chronic Atrial Fibrillation - Patient is on amiodarone - Will add carvedilol at low dose.  Titrate up if tolerated. - Heart rate up at 114-121 on 8/29 increased dose of carvedilol to 6.25 - NOT a COUMADIN candidate due to spontaneous SDH in 08/2011  3. Dehydration on admission  - Likely contributing to elevated heart rates and ARF. - Patient eating and drinking well so iv fluids were discontinued 8/30  4. Anemia - Stable  currently.  No active bleeding.  May have been myelosuppression secondary to recent infection vs chronic anemia.  5. Diarrhea - C diff pcr negative 8/24 and 10/01/11 - Likely contributed to low phosphorus. - no significant amounts noted during hospitalization  - empiric flagyl for now    6. Hypophosphatemia - Likely secondary to # 5 repleted iv   7. ARF - resolved with IV fluid rehydration.  May have been secondary to dehydration and sepsis.     8. Chest wall discomfort - At this point reproducible on palpation.   - EKG reviewed with no ST elevations or depressions.  Shows afib at rate of 107 - CT of chest shows RUL consolidation consistent with PNA - Continue with tramadol for pain relief.  9. AICD - patient thinks the device has fired recently - but the [employer] rep has not found objective evidence in regards to that.   Code Status: Full Family CommunicationHayden Rasmussen Daughter [phone] [name],[name] Relative [phone]  Disposition Plan: home with home health    Brief narrative: 78 year old male patient with multiple medical problems. Recent hospital admission July 2013 because of subdural hematoma. A CT scan of the chest done during that admission to rule out any form of malignancy demonstrated a moderate right-sided pleural effusion that subsequently resolved. He presented back to the hospital on 09/23/2011 and was found to be hypotensive, volume depleted and had right upper lobe healthcare acquired pneumonia. He was aggressively volume resuscitated and initiated on Zosyn and vancomycin. Because of persistent hypotension and worsening hypoxemia pulmonary critical care medicine assumed care of the patient shortly after  admission. Patient did require pressor support briefly but was rapidly weaned. Subsequently he has improved significantly and stabilized enough to transfer out of the ICU to step down on 09/25/2011. [facility] assumed care of the  patient on 09/26/2011.  Patient was transferred to 6700 on 8/27 and is currently receiving IV antibiotics. Was tried to transition to oral antibiotics but had increase in WBC and placed back on IV antibiotics.  Chest discomfort was reported but seems musculoskeletal.  Consultants: Pulmonary critical care medicine-signed off 09/25/2011  Procedures: Right subclavian central line 09/23/2011 >>> plan discontinue 09/26/2011  Antibiotics: 1) Zosyn (09/23/11) -8/28 then 8/29- 2) Vancomycin (09/23/11) -8/28 then 8/29- 3)Augmentin 8/28-8/29 4)Zithromax 8/28-8/29 5)Diflucan 8/30- 6)Flagyl 8/31 -    HPI/Subjective: Less dyspnea   Objective: Filed Vitals:   09/30/11 1629 09/30/11 1814 09/30/11 2044 10/01/11 0428  BP: 99/62 106/64 113/78 116/71  Pulse: 99 104 118 110  Temp: 97.3 F (36.3 C) 97 F (36.1 C) 98 F (36.7 C) 97.6 F (36.4 C)  TempSrc:  Oral Oral Oral  Resp: 26 24 24 20   Height:      Weight:   90.4 kg (199 lb 4.7 oz)   SpO2: 97% 97% 92% 94%    Intake/Output Summary (Last 24 hours) at 10/01/11 0825 Last data filed at 09/30/11 2038  Gross per 24 hour  Intake   1020 ml  Output      0 ml  Net   1020 ml   Filed Weights   09/28/11 1900 09/29/11 2130 09/30/11 2044  Weight: 87.7 kg (193 lb 5.5 oz) 89.858 kg (198 lb 1.6 oz) 90.4 kg (199 lb 4.7 oz)    Exam:   General:  Pt in NAD, A and O x 3  Cardiovascular: irregularly irregular,tachycardic no murmurs  Musculoskeletal:  Pain with palpation over right chest.  Respiratory: Mild rhales at right lung field with decreased BS at right lung base, no Wheezes, sputum clear   Abdomen: Soft, NT, ND  Extremities with plus 4 edema - widespread   Data Reviewed: Basic Metabolic Panel:  Lab 10/01/11 0981 09/30/11 0655 09/28/11 0530 09/27/11 0655 09/26/11 0525 09/25/11 0405  NA 140 141 142 142 136 --  K 3.6 3.4* 3.9 3.9 3.9 --  CL 103 105 107 107 105 --  CO2 28 28 27 23 22  --  GLUCOSE 103* 103* 139* 134* 132* --  BUN 12 16  26* 28* 27* --  CREATININE 0.90 0.87 0.94 0.96 0.95 --  CALCIUM 7.9* 8.2* 8.3* 8.5 8.4 --  MG -- -- -- -- 1.9 1.7  PHOS -- -- 3.3 -- 1.3* 1.9*   Liver Function Tests: No results found for this basename: AST:5,ALT:5,ALKPHOS:5,BILITOT:5,PROT:5,ALBUMIN:5 in the last 168 hours No results found for this basename: LIPASE:5,AMYLASE:5 in the last 168 hours No results found for this basename: AMMONIA:5 in the last 168 hours CBC:  Lab 10/01/11 0500 09/30/11 0944 09/29/11 0535 09/28/11 0530 09/27/11 0655  WBC 18.4* 21.9* 18.2* 12.7* 16.7*  NEUTROABS -- -- -- -- --  HGB 11.1* 11.9* 12.1* 11.4* 11.5*  HCT 34.6* 36.7* 37.4* 34.4* 34.2*  MCV 88.0 88.4 88.0 88.0 85.7  PLT 188 179 184 152 152   Cardiac Enzymes:  Lab 09/29/11 0535  CKTOTAL 32  CKMB 2.2  CKMBINDEX --  TROPONINI <0.30   BNP (last 3 results)  Basename 08/12/11 1604 11/29/10 0500 11/28/10 1338  PROBNP 422.7 1050.0* 1356.0*   CBG:  Lab 09/26/11 1152 09/26/11 1914 09/26/11 0529 09/26/11 0011 09/25/11 1957  GLUCAP 145* 120* 128* 125* 119*    Recent Results (from the past 240 hour(s))  CULTURE, BLOOD (ROUTINE X 2)     Status: Normal   Collection Time   09/23/11  4:40 PM      Component Value Range Status Comment   Specimen Description BLOOD HAND LEFT   Final    Special Requests BOTTLES DRAWN AEROBIC ONLY 10CC   Final    Culture  Setup Time 09/23/2011 21:08   Final    Culture NO GROWTH 5 DAYS   Final    Report Status 09/29/2011 FINAL   Final   CULTURE, BLOOD (ROUTINE X 2)     Status: Normal   Collection Time   09/23/11  4:50 PM      Component Value Range Status Comment   Specimen Description BLOOD HAND LEFT   Final    Special Requests BOTTLES DRAWN AEROBIC ONLY 10CC   Final    Culture  Setup Time 09/23/2011 21:08   Final    Culture NO GROWTH 5 DAYS   Final    Report Status 09/29/2011 FINAL   Final   MRSA PCR SCREENING     Status: Abnormal   Collection Time   09/23/11  6:03 PM      Component Value Range Status Comment    MRSA by PCR POSITIVE (*) NEGATIVE Final   URINE CULTURE     Status: Normal   Collection Time   09/23/11  6:39 PM      Component Value Range Status Comment   Specimen Description URINE, CATHETERIZED   Final    Special Requests NONE   Final    Culture  Setup Time 09/23/2011 21:02   Final    Colony Count NO GROWTH   Final    Culture NO GROWTH   Final    Report Status 09/25/2011 FINAL   Final   CULTURE, EXPECTORATED SPUTUM-ASSESSMENT     Status: Normal   Collection Time   09/23/11  8:39 PM      Component Value Range Status Comment   Specimen Description SPUTUM   Final    Special Requests NONE   Final    Sputum evaluation     Final    Value: THIS SPECIMEN IS ACCEPTABLE. RESPIRATORY CULTURE REPORT TO FOLLOW.   Report Status 09/23/2011 FINAL   Final   CULTURE, RESPIRATORY     Status: Normal   Collection Time   09/23/11  8:39 PM      Component Value Range Status Comment   Specimen Description SPUTUM   Final    Special Requests NONE   Final    Gram Stain     Final    Value: FEW WBC PRESENT,BOTH PMN AND MONONUCLEAR     RARE SQUAMOUS EPITHELIAL CELLS PRESENT     RARE GRAM POSITIVE COCCI     IN PAIRS IN CLUSTERS RARE GRAM POSITIVE RODS   Culture NORMAL OROPHARYNGEAL FLORA   Final    Report Status 09/26/2011 FINAL   Final   CLOSTRIDIUM DIFFICILE BY PCR     Status: Normal   Collection Time   09/24/11 11:56 AM      Component Value Range Status Comment   C difficile by pcr NEGATIVE  NEGATIVE Final   CULTURE, EXPECTORATED SPUTUM-ASSESSMENT     Status: Normal   Collection Time   09/29/11  1:15 PM      Component Value Range Status Comment   Specimen Description Expect. Sput   Final  Special Requests Normal   Final    Sputum evaluation     Final    Value: THIS SPECIMEN IS ACCEPTABLE. RESPIRATORY CULTURE REPORT TO FOLLOW.   Report Status 09/29/2011 FINAL   Final   CULTURE, RESPIRATORY     Status: Normal (Preliminary result)   Collection Time   09/29/11  1:15 PM      Component Value Range  Status Comment   Specimen Description SPUTUM   Final    Special Requests NONE   Final    Gram Stain PENDING   Incomplete    Culture FEW YEAST CONSISTENT WITH CANDIDA SPECIES   Final    Report Status PENDING   Incomplete      Studies: Dg Chest Port 1 View  09/24/2011  *RADIOLOGY REPORT*  Clinical Data: Right-sided central line placement.  PORTABLE CHEST - 1 VIEW  Comparison: 09/23/2011  Findings: Interval right subclavian approach central venous catheter placement.  The the catheter projects over the SVC however I cannot confirm tip position due to the AICD leads.  Again noted is a right upper lobe consolidation.  Aortic arch atherosclerosis. Heart size upper normal to mildly enlarged.  No interval osseous change.  No pneumothorax.  No definite pleural effusion.  IMPRESSION: Right subclavian approach catheter projects over the SVC, however I cannot confirm tip position as it is obscured by the AICD leads.  Right upper lobe consolidation is most in keeping with pneumonia. Recommend radiographic follow-up after treatment to document resolution.   Original Report Authenticated By: Waneta Martins, M.D.    Dg Abd Acute W/chest  09/23/2011  *RADIOLOGY REPORT*  Clinical Data: Weakness, diarrhea, history hypertension, CHF, coronary disease post MI and CABG, asthma, diabetes  ACUTE ABDOMEN SERIES (ABDOMEN 2 VIEW & CHEST 1 VIEW)  Comparison: 08/17/2011  Findings: Left subclavian transvenous AICD leads project over right atrium and right ventricle. Epicardial pacing wires and defibrillator leads present. Upper normal heart size post median sternotomy. Atherosclerotic calcification of a tortuous thoracic aorta. Right upper lobe infiltrate consistent with pneumonia. Remaining lungs clear. Bones demineralized.  Surgical clips right upper quadrant question cholecystectomy. Nonobstructive bowel gas pattern. No bowel dilatation, bowel wall thickening, or free intraperitoneal air. Calcifications project over the  kidneys bilaterally question renal calculi. Bilateral pelvic phleboliths and atherosclerotic calcifications. Degenerative changes lumbar spine and bilateral hip joints.  IMPRESSION: Nonobstructive bowel gas pattern. Question bilateral renal calculi. Right upper lobe pneumonia. Due to the more focal parenchymal opacity at the right apex, follow- up chest radiographs until resolution recommended to exclude underlying abnormalities including tumor.   Original Report Authenticated By: Lollie Marrow, M.D.     Scheduled Meds:    . amiodarone  200 mg Oral Daily  . antiseptic oral rinse  15 mL Mouth Rinse BID  . atorvastatin  10 mg Oral q1800  . carvedilol  6.25 mg Oral BID WC  . Chlorhexidine Gluconate Cloth  6 each Topical Q0600  . cycloSPORINE  1 drop Both Eyes BID  . dexlansoprazole  60 mg Oral Q1200  . fluconazole (DIFLUCAN) IV  100 mg Intravenous Q24H  . furosemide  40 mg Intravenous Once  . mupirocin ointment  1 application Nasal BID  . omega-3 acid ethyl esters  1 g Oral BID  . piperacillin-tazobactam (ZOSYN)  IV  3.375 g Intravenous Q8H  . potassium chloride  40 mEq Oral Once  . psyllium  1 packet Oral Daily  . sodium chloride  3 mL Intravenous Q12H  . vancomycin  1,000  mg Intravenous Q12H   Continuous Infusions:    . DISCONTD: sodium chloride Stopped (09/30/11 1025)    Principal Problem:  *SIRS (systemic inflammatory response syndrome) Active Problems:  VENTRICULAR TACHYCARDIA  Chronic atrial fibrillation  Chronic systolic heart failure- EF 35-40%  AUTOMATIC IMPLANTABLE CARDIAC DEFIBRILLATOR SITU  CAD (coronary artery disease)  Bradycardia  Anemia, iron deficiency  Ataxia  Dehydration  History of subdural hemorrhage  Hypotension  HCAP (healthcare-associated pneumonia)  Thrombocytopenia  Weakness generalized  Diarrhea  Hypophosphatemia  Chest wall discomfort  Acute on chronic systolic CHF (congestive heart failure)        Marrah Vanevery  [facility] Pager [phone]. If 8PM-8AM, please contact night-coverage at [email or web address], password [facility] 10/01/2011, 8:25 AM  LOS: 8 days

## 2011-10-02 LAB — CBC
MCV: 87.9 fL (ref 78.0–100.0)
Platelets: 215 10*3/uL (ref 150–400)
RBC: 3.81 MIL/uL — ABNORMAL LOW (ref 4.22–5.81)
WBC: 15.4 10*3/uL — ABNORMAL HIGH (ref 4.0–10.5)

## 2011-10-02 LAB — BASIC METABOLIC PANEL
CO2: 32 mEq/L (ref 19–32)
Chloride: 101 mEq/L (ref 96–112)
Creatinine, Ser: 1.06 mg/dL (ref 0.50–1.35)
GFR calc Af Amer: 77 mL/min — ABNORMAL LOW (ref >90)
Potassium: 3.3 mEq/L — ABNORMAL LOW (ref 3.5–5.1)

## 2011-10-02 MED ORDER — FUROSEMIDE 10 MG/ML IJ SOLN
40.0000 mg | Freq: Once | INTRAMUSCULAR | Status: AC
  Administered 2011-10-02: 40 mg via INTRAVENOUS
  Filled 2011-10-02: qty 4

## 2011-10-02 MED ORDER — POTASSIUM CHLORIDE CRYS ER 20 MEQ PO TBCR
40.0000 meq | EXTENDED_RELEASE_TABLET | Freq: Once | ORAL | Status: AC
  Administered 2011-10-02: 40 meq via ORAL
  Filled 2011-10-02: qty 2

## 2011-10-02 MED ORDER — ALUM & MAG HYDROXIDE-SIMETH 200-200-20 MG/5ML PO SUSP
15.0000 mL | Freq: Three times a day (TID) | ORAL | Status: DC
Start: 2011-10-02 — End: 2011-10-11
  Administered 2011-10-02 – 2011-10-11 (×22): 15 mL via ORAL
  Filled 2011-10-02 (×33): qty 30

## 2011-10-02 NOTE — Progress Notes (Signed)
ANTIBIOTIC CONSULT NOTE - FOLLOW UP  Pharmacy Consult for Vancomycin and Zosyn Indication: pneumonia  Allergies  Allergen Reactions  . Celebrex (Celecoxib) Hives and Other (See Comments)    Gi upset  . Digoxin Other (See Comments)    Unknown   . Esomeprazole Magnesium Hives    "don't really remember"  . Multaq (Dronedarone Hydrochloride) Other (See Comments)    "don't remember"  . Protonix (Pantoprazole Sodium) Nausea And Vomiting    Tolerates Dexilant    Patient Measurements: Height: 6' (182.9 cm) Weight: 198 lb 10.2 oz (90.1 kg) IBW/kg (Calculated) : 77.6  Adjusted Body Weight:   Vital Signs: Temp: 97.5 F (36.4 C) (09/01 1400) Temp src: Oral (09/01 1400) BP: 113/76 mmHg (09/01 1400) Pulse Rate: 110  (09/01 1400) Intake/Output from previous day: 08/31 0701 - 09/01 0700 In: 720 [P.O.:720] Out: 150 [Urine:150] Intake/Output from this shift: Total I/O In: 480 [P.O.:480] Out: 425 [Urine:425]  Labs:  [facility] 10/02/11 0538 10/01/11 0500 09/30/11 0944 09/30/11 0655  WBC 15.4* 18.4* 21.9* --  HGB 10.8* 11.1* 11.9* --  PLT 215 188 179 --  LABCREA -- -- -- --  CREATININE 1.06 0.90 -- 0.87   Estimated Creatinine Clearance: 65.1 ml/min (by C-G formula based on Cr of 1.06). No results found for this basename: VANCOTROUGH:2,VANCOPEAK:2,VANCORANDOM:2,GENTTROUGH:2,GENTPEAK:2,GENTRANDOM:2,TOBRATROUGH:2,TOBRAPEAK:2,TOBRARND:2,AMIKACINPEAK:2,AMIKACINTROU:2,AMIKACIN:2, in the last 72 hours   Microbiology: Recent Results (from the past 720 hour(s))  CULTURE, BLOOD (ROUTINE X 2)     Status: Normal   Collection Time   09/23/11  4:40 PM      Component Value Range Status Comment   Specimen Description BLOOD HAND LEFT   Final    Special Requests BOTTLES DRAWN AEROBIC ONLY 10CC   Final    Culture  Setup Time 09/23/2011 21:08   Final    Culture NO GROWTH 5 DAYS   Final    Report Status 09/29/2011 FINAL   Final   CULTURE, BLOOD (ROUTINE X 2)     Status: Normal   Collection Time     09/23/11  4:50 PM      Component Value Range Status Comment   Specimen Description BLOOD HAND LEFT   Final    Special Requests BOTTLES DRAWN AEROBIC ONLY 10CC   Final    Culture  Setup Time 09/23/2011 21:08   Final    Culture NO GROWTH 5 DAYS   Final    Report Status 09/29/2011 FINAL   Final   MRSA PCR SCREENING     Status: Abnormal   Collection Time   09/23/11  6:03 PM      Component Value Range Status Comment   MRSA by PCR POSITIVE (*) NEGATIVE Final   URINE CULTURE     Status: Normal   Collection Time   09/23/11  6:39 PM      Component Value Range Status Comment   Specimen Description URINE, CATHETERIZED   Final    Special Requests NONE   Final    Culture  Setup Time 09/23/2011 21:02   Final    Colony Count NO GROWTH   Final    Culture NO GROWTH   Final    Report Status 09/25/2011 FINAL   Final   CULTURE, EXPECTORATED SPUTUM-ASSESSMENT     Status: Normal   Collection Time   09/23/11  8:39 PM      Component Value Range Status Comment   Specimen Description SPUTUM   Final    Special Requests NONE   Final    Sputum evaluation  Final    Value: THIS SPECIMEN IS ACCEPTABLE. RESPIRATORY CULTURE REPORT TO FOLLOW.   Report Status 09/23/2011 FINAL   Final   CULTURE, RESPIRATORY     Status: Normal   Collection Time   09/23/11  8:39 PM      Component Value Range Status Comment   Specimen Description SPUTUM   Final    Special Requests NONE   Final    Gram Stain     Final    Value: FEW WBC PRESENT,BOTH PMN AND MONONUCLEAR     RARE SQUAMOUS EPITHELIAL CELLS PRESENT     RARE GRAM POSITIVE COCCI     IN PAIRS IN CLUSTERS RARE GRAM POSITIVE RODS   Culture NORMAL OROPHARYNGEAL FLORA   Final    Report Status 09/26/2011 FINAL   Final   CLOSTRIDIUM DIFFICILE BY PCR     Status: Normal   Collection Time   09/24/11 11:56 AM      Component Value Range Status Comment   C difficile by pcr NEGATIVE  NEGATIVE Final   CULTURE, EXPECTORATED SPUTUM-ASSESSMENT     Status: Normal   Collection Time    09/29/11  1:15 PM      Component Value Range Status Comment   Specimen Description Expect. Sput   Final    Special Requests Normal   Final    Sputum evaluation     Final    Value: THIS SPECIMEN IS ACCEPTABLE. RESPIRATORY CULTURE REPORT TO FOLLOW.   Report Status 09/29/2011 FINAL   Final   CULTURE, RESPIRATORY     Status: Normal   Collection Time   09/29/11  1:15 PM      Component Value Range Status Comment   Specimen Description SPUTUM   Final    Special Requests NONE   Final    Gram Stain     Final    Value: FEW WBC PRESENT, PREDOMINANTLY PMN     RARE SQUAMOUS EPITHELIAL CELLS PRESENT     NO ORGANISMS SEEN   Culture FEW YEAST CONSISTENT WITH CANDIDA SPECIES   Final    Report Status 10/01/2011 FINAL   Final   CLOSTRIDIUM DIFFICILE BY PCR     Status: Normal   Collection Time   10/01/11 12:23 PM      Component Value Range Status Comment   C difficile by pcr NEGATIVE  NEGATIVE Final     Anti-infectives     Start     Dose/Rate Route Frequency Ordered Stop   10/01/11 2200   metroNIDAZOLE (FLAGYL) IVPB 500 mg        500 mg 100 mL/hr over 60 Minutes Intravenous 3 times per day 10/01/11 1727     10/01/11 1000   metroNIDAZOLE (FLAGYL) IVPB 500 mg  Status:  Discontinued        500 mg 100 mL/hr over 60 Minutes Intravenous Every 8 hours 10/01/11 0956 10/01/11 1727   10/01/11 0000  piperacillin-tazobactam (ZOSYN) IVPB 3.375 g       3.375 g 12.5 mL/hr over 240 Minutes Intravenous 3 times per day 10/01/11 1726     09/30/11 1400   fluconazole (DIFLUCAN) IVPB 100 mg        100 mg 50 mL/hr over 60 Minutes Intravenous Every 24 hours 09/30/11 1314     09/29/11 1200   vancomycin (VANCOCIN) IVPB 1000 mg/200 mL premix        1,000 mg 200 mL/hr over 60 Minutes Intravenous Every 12 hours 09/29/11 1116     09/29/11 1200  piperacillin-tazobactam (ZOSYN) IVPB 3.375 g  Status:  Discontinued        3.375 g 12.5 mL/hr over 240 Minutes Intravenous 3 times per day 09/29/11 1116 10/01/11 1726    09/28/11 2200   amoxicillin-clavulanate (AUGMENTIN) 875-125 MG per tablet 1 tablet  Status:  Discontinued        1 tablet Oral Every 12 hours 09/28/11 2032 09/29/11 1106   09/28/11 2200   azithromycin (ZITHROMAX) tablet 500 mg  Status:  Discontinued        500 mg Oral Every 24 hours 09/28/11 2032 09/29/11 1106   09/25/11 1100   vancomycin (VANCOCIN) IVPB 1000 mg/200 mL premix  Status:  Discontinued        1,000 mg 200 mL/hr over 60 Minutes Intravenous 2 times daily 09/25/11 1046 09/28/11 2031   09/24/11 2000   vancomycin (VANCOCIN) IVPB 1000 mg/200 mL premix  Status:  Discontinued        1,000 mg 200 mL/hr over 60 Minutes Intravenous Every 24 hours 09/23/11 1818 09/25/11 1046   09/24/11 1400   piperacillin-tazobactam (ZOSYN) IVPB 3.375 g  Status:  Discontinued        3.375 g 12.5 mL/hr over 240 Minutes Intravenous 3 times per day 09/24/11 1131 09/28/11 2031   09/23/11 2359   piperacillin-tazobactam (ZOSYN) IVPB 2.25 g  Status:  Discontinued        2.25 g 100 mL/hr over 30 Minutes Intravenous 4 times per day 09/23/11 1756 09/24/11 1131   09/23/11 1815  piperacillin-tazobactam (ZOSYN) IVPB 3.375 g       3.375 g 100 mL/hr over 30 Minutes Intravenous  Once 09/23/11 1812 09/23/11 1848   09/23/11 1530   vancomycin (VANCOCIN) IVPB 1000 mg/200 mL premix        1,000 mg 200 mL/hr over 60 Minutes Intravenous  Once 09/23/11 1522 09/23/11 1920   09/23/11 1530   piperacillin-tazobactam (ZOSYN) IVPB 3.375 g  Status:  Discontinued        3.375 g 12.5 mL/hr over 240 Minutes Intravenous  Once 09/23/11 1522 09/23/11 1812          Assessment: 78yom on Vancomycin and Zosyn Day 4 for empiric HCAP coverage. Patient was initially on Vancomycin and Zosyn from 8/23 to 8/28 but was then restarted on 8/29 for worsening SOB and CP. Patient has remained afebrile and WBC are trending down. Patient's renal function has remained fairly stable during admission. MD has also added Diflucan to regimen.  Vancomycin previously therapeutic on 1g q12h - will check trough tomorrow to confirm.  Goal of Therapy:  Vancomycin trough level 15-20 mcg/ml  Plan:  1. Continue Vancomycin 1g IV q12h and Zosyn 3.375g IV q8h 2. Check Vancomycin trough @ 1130 tomorrow 3. Monitor renal function, cultures, plan to narrow/simplify antibiotic regimen  [name], [name] [phone] 10/02/2011,3:12 PM

## 2011-10-02 NOTE — Progress Notes (Signed)
[facility] PROGRESS NOTE  [name] ZOX:[id number] DOB: 1932/10/07 DOA: 09/23/2011 PCP: Hoyle Sauer, MD  Assessment/Plan: Principal Problem:  *SIRS (systemic inflammatory response syndrome) Active Problems:  VENTRICULAR TACHYCARDIA  Chronic atrial fibrillation  Chronic systolic heart failure- EF 35-40%  AUTOMATIC IMPLANTABLE CARDIAC DEFIBRILLATOR SITU  CAD (coronary artery disease)  Bradycardia  Anemia, iron deficiency  Ataxia  Dehydration  History of subdural hemorrhage  Hypotension  HCAP (healthcare-associated pneumonia)  Thrombocytopenia  Weakness generalized  Diarrhea  Hypophosphatemia  Chest wall discomfort  Acute on chronic systolic CHF (congestive heart failure)  Acute on chronic systolic CHF - pulmonary edema - volume overload - stopped iv fluids 8/30 - started iv lasix 8/30  Sepsis - Resolved after boluses of iv fluids and stress dose steroids given on admission.  - Discontinued stress dose steroids on 8/28.  Given that he has only had 5 days of steroid therapy he likely has not had adrenal suppresion and hence steroids were discontinue 8/28.  HAP: - patient was placed on iv abx on admission (Vanc and Zosyn) He did develop resp failure, severe hypoxia, and had to have ICU stay - he has improved gradually. The pleural efussion is felt to be chronic based on Ct findings.  - needs outpt CXR f/u to assure resolution of infiltrate  - CT scan with RUL mass - suspected pneumonia but we may need to r/o malignancy if he does not improve   Pleural efussion - persistent - will have IR guided diagnostic thoracentesis prior to Dc   2. Chronic Atrial Fibrillation - Patient is on amiodarone - Will add carvedilol at low dose.  Titrate up if tolerated. - Heart rate up at 114-121 on 8/29 increased dose of carvedilol to 6.25 - NOT a COUMADIN candidate due to spontaneous SDH in 08/2011  3. Dehydration on admission  - Likely contributing to elevated heart  rates and ARF. - Patient eating and drinking well so iv fluids were discontinued 8/30  4. Anemia - Stable currently.  No active bleeding.  May have been myelosuppression secondary to recent infection vs chronic anemia.  5. Diarrhea - C diff pcr negative 8/24 and 10/01/11 - Likely contributed to low phosphorus. - no significant amounts noted during hospitalization  - empiric flagyl started 8/31   6. Hypophosphatemia - Likely secondary to # 5 repleted iv   7. ARF - resolved with IV fluid rehydration.  May have been secondary to dehydration and sepsis.     8. Chest wall discomfort - At this point reproducible on palpation.   - EKG reviewed with no ST elevations or depressions.  Shows afib at rate of 107 - CT of chest shows RUL consolidation consistent with PNA - Continue with tramadol for pain relief.  9. AICD - patient thinks the device has fired recently - but the [employer] rep has not found objective evidence in regards to that.   Code Status: Full Family CommunicationHayden Rasmussen Daughter [phone] [name],[name] Relative [phone]  Disposition Plan: home with home health    Brief narrative: 78 year old male patient with multiple medical problems. Recent hospital admission July 2013 because of subdural hematoma. A CT scan of the chest done during that admission to rule out any form of malignancy demonstrated a moderate right-sided pleural effusion that subsequently resolved. He presented back to the hospital on 09/23/2011 and was found to be hypotensive, volume depleted and had right upper lobe healthcare acquired pneumonia. He was aggressively volume resuscitated and initiated on Zosyn and vancomycin. Because  of persistent hypotension and worsening hypoxemia pulmonary critical care medicine assumed care of the patient shortly after admission. Patient did require pressor support briefly but was rapidly weaned. Subsequently he has improved  significantly and stabilized enough to transfer out of the ICU to step down on 09/25/2011. [facility] assumed care of the patient on 09/26/2011.  Patient was transferred to 6700 on 8/27 and is currently receiving IV antibiotics. Was tried to transition to oral antibiotics but had increase in WBC and placed back on IV antibiotics.  Chest discomfort was reported but seems musculoskeletal.  Consultants: Pulmonary critical care medicine-signed off 09/25/2011  Procedures: Right subclavian central line 09/23/2011 >>> plan discontinue 09/26/2011  Antibiotics: 1) Zosyn (09/23/11) -8/28 then 8/29- 2) Vancomycin (09/23/11) -8/28 then 8/29- 3)Augmentin 8/28-8/29 4)Zithromax 8/28-8/29 5)Diflucan 8/30- 6)Flagyl 8/31 -    HPI/Subjective: Less dyspnea   Objective: Filed Vitals:   10/01/11 1616 10/01/11 2121 10/02/11 0609 10/02/11 0802  BP: 114/59 99/66 102/59 114/61  Pulse: 93 88 90 97  Temp: 97.6 F (36.4 C) 97.5 F (36.4 C) 97.6 F (36.4 C) 97.6 F (36.4 C)  TempSrc: Oral Oral Oral Oral  Resp: 20 18 19 18   Height:      Weight:  90.1 kg (198 lb 10.2 oz)    SpO2: 93% 97% 94% 95%    Intake/Output Summary (Last 24 hours) at 10/02/11 0848 Last data filed at 10/02/11 0736  Gross per 24 hour  Intake    720 ml  Output    250 ml  Net    470 ml   Filed Weights   09/29/11 2130 09/30/11 2044 10/01/11 2121  Weight: 89.858 kg (198 lb 1.6 oz) 90.4 kg (199 lb 4.7 oz) 90.1 kg (198 lb 10.2 oz)    Exam:   General:  Pt in NAD, A and O x 3  Cardiovascular: irregularly irregular,tachycardic no murmurs  Musculoskeletal:  Pain with palpation over right chest.  Respiratory: Mild rhales at right lung field with decreased BS at right lung base, no Wheezes, sputum clear   Abdomen: Soft, NT, ND  Extremities with +2 edema - LE>UE  Data Reviewed: Basic Metabolic Panel:  Lab 10/02/11 1610 10/01/11 0500 09/30/11 0655 09/28/11 0530 09/27/11 0655 09/26/11 0525  NA 140 140 141 142 142 --  K 3.3*  3.6 3.4* 3.9 3.9 --  CL 101 103 105 107 107 --  CO2 32 28 28 27 23  --  GLUCOSE 106* 103* 103* 139* 134* --  BUN 12 12 16  26* 28* --  CREATININE 1.06 0.90 0.87 0.94 0.96 --  CALCIUM 7.8* 7.9* 8.2* 8.3* 8.5 --  MG -- -- -- -- -- 1.9  PHOS -- -- -- 3.3 -- 1.3*   Liver Function Tests: No results found for this basename: AST:5,ALT:5,ALKPHOS:5,BILITOT:5,PROT:5,ALBUMIN:5 in the last 168 hours No results found for this basename: LIPASE:5,AMYLASE:5 in the last 168 hours No results found for this basename: AMMONIA:5 in the last 168 hours CBC:  Lab 10/02/11 0538 10/01/11 0500 09/30/11 0944 09/29/11 0535 09/28/11 0530  WBC 15.4* 18.4* 21.9* 18.2* 12.7*  NEUTROABS -- -- -- -- --  HGB 10.8* 11.1* 11.9* 12.1* 11.4*  HCT 33.5* 34.6* 36.7* 37.4* 34.4*  MCV 87.9 88.0 88.4 88.0 88.0  PLT 215 188 179 184 152   Cardiac Enzymes:  Lab 09/29/11 0535  CKTOTAL 32  CKMB 2.2  CKMBINDEX --  TROPONINI <0.30   BNP (last 3 results)  Basename 08/12/11 1604 11/29/10 0500 11/28/10 1338  PROBNP 422.7 1050.0* 1356.0*  CBG:  Lab 09/26/11 1152 09/26/11 0819 09/26/11 0529 09/26/11 0011 09/25/11 1957  GLUCAP 145* 120* 128* 125* 119*    Recent Results (from the past 240 hour(s))  CULTURE, BLOOD (ROUTINE X 2)     Status: Normal   Collection Time   09/23/11  4:40 PM      Component Value Range Status Comment   Specimen Description BLOOD HAND LEFT   Final    Special Requests BOTTLES DRAWN AEROBIC ONLY 10CC   Final    Culture  Setup Time 09/23/2011 21:08   Final    Culture NO GROWTH 5 DAYS   Final    Report Status 09/29/2011 FINAL   Final   CULTURE, BLOOD (ROUTINE X 2)     Status: Normal   Collection Time   09/23/11  4:50 PM      Component Value Range Status Comment   Specimen Description BLOOD HAND LEFT   Final    Special Requests BOTTLES DRAWN AEROBIC ONLY 10CC   Final    Culture  Setup Time 09/23/2011 21:08   Final    Culture NO GROWTH 5 DAYS   Final    Report Status 09/29/2011 FINAL   Final   MRSA  PCR SCREENING     Status: Abnormal   Collection Time   09/23/11  6:03 PM      Component Value Range Status Comment   MRSA by PCR POSITIVE (*) NEGATIVE Final   URINE CULTURE     Status: Normal   Collection Time   09/23/11  6:39 PM      Component Value Range Status Comment   Specimen Description URINE, CATHETERIZED   Final    Special Requests NONE   Final    Culture  Setup Time 09/23/2011 21:02   Final    Colony Count NO GROWTH   Final    Culture NO GROWTH   Final    Report Status 09/25/2011 FINAL   Final   CULTURE, EXPECTORATED SPUTUM-ASSESSMENT     Status: Normal   Collection Time   09/23/11  8:39 PM      Component Value Range Status Comment   Specimen Description SPUTUM   Final    Special Requests NONE   Final    Sputum evaluation     Final    Value: THIS SPECIMEN IS ACCEPTABLE. RESPIRATORY CULTURE REPORT TO FOLLOW.   Report Status 09/23/2011 FINAL   Final   CULTURE, RESPIRATORY     Status: Normal   Collection Time   09/23/11  8:39 PM      Component Value Range Status Comment   Specimen Description SPUTUM   Final    Special Requests NONE   Final    Gram Stain     Final    Value: FEW WBC PRESENT,BOTH PMN AND MONONUCLEAR     RARE SQUAMOUS EPITHELIAL CELLS PRESENT     RARE GRAM POSITIVE COCCI     IN PAIRS IN CLUSTERS RARE GRAM POSITIVE RODS   Culture NORMAL OROPHARYNGEAL FLORA   Final    Report Status 09/26/2011 FINAL   Final   CLOSTRIDIUM DIFFICILE BY PCR     Status: Normal   Collection Time   09/24/11 11:56 AM      Component Value Range Status Comment   C difficile by pcr NEGATIVE  NEGATIVE Final   CULTURE, EXPECTORATED SPUTUM-ASSESSMENT     Status: Normal   Collection Time   09/29/11  1:15 PM      Component  Value Range Status Comment   Specimen Description Expect. Sput   Final    Special Requests Normal   Final    Sputum evaluation     Final    Value: THIS SPECIMEN IS ACCEPTABLE. RESPIRATORY CULTURE REPORT TO FOLLOW.   Report Status 09/29/2011 FINAL   Final   CULTURE,  RESPIRATORY     Status: Normal   Collection Time   09/29/11  1:15 PM      Component Value Range Status Comment   Specimen Description SPUTUM   Final    Special Requests NONE   Final    Gram Stain     Final    Value: FEW WBC PRESENT, PREDOMINANTLY PMN     RARE SQUAMOUS EPITHELIAL CELLS PRESENT     NO ORGANISMS SEEN   Culture FEW YEAST CONSISTENT WITH CANDIDA SPECIES   Final    Report Status 10/01/2011 FINAL   Final   CLOSTRIDIUM DIFFICILE BY PCR     Status: Normal   Collection Time   10/01/11 12:23 PM      Component Value Range Status Comment   C difficile by pcr NEGATIVE  NEGATIVE Final      Studies: Dg Chest Port 1 View  09/24/2011  *RADIOLOGY REPORT*  Clinical Data: Right-sided central line placement.  PORTABLE CHEST - 1 VIEW  Comparison: 09/23/2011  Findings: Interval right subclavian approach central venous catheter placement.  The the catheter projects over the SVC however I cannot confirm tip position due to the AICD leads.  Again noted is a right upper lobe consolidation.  Aortic arch atherosclerosis. Heart size upper normal to mildly enlarged.  No interval osseous change.  No pneumothorax.  No definite pleural effusion.  IMPRESSION: Right subclavian approach catheter projects over the SVC, however I cannot confirm tip position as it is obscured by the AICD leads.  Right upper lobe consolidation is most in keeping with pneumonia. Recommend radiographic follow-up after treatment to document resolution.   Original Report Authenticated By: Waneta Martins, M.D.    Dg Abd Acute W/chest  09/23/2011  *RADIOLOGY REPORT*  Clinical Data: Weakness, diarrhea, history hypertension, CHF, coronary disease post MI and CABG, asthma, diabetes  ACUTE ABDOMEN SERIES (ABDOMEN 2 VIEW & CHEST 1 VIEW)  Comparison: 08/17/2011  Findings: Left subclavian transvenous AICD leads project over right atrium and right ventricle. Epicardial pacing wires and defibrillator leads present. Upper normal heart size post  median sternotomy. Atherosclerotic calcification of a tortuous thoracic aorta. Right upper lobe infiltrate consistent with pneumonia. Remaining lungs clear. Bones demineralized.  Surgical clips right upper quadrant question cholecystectomy. Nonobstructive bowel gas pattern. No bowel dilatation, bowel wall thickening, or free intraperitoneal air. Calcifications project over the kidneys bilaterally question renal calculi. Bilateral pelvic phleboliths and atherosclerotic calcifications. Degenerative changes lumbar spine and bilateral hip joints.  IMPRESSION: Nonobstructive bowel gas pattern. Question bilateral renal calculi. Right upper lobe pneumonia. Due to the more focal parenchymal opacity at the right apex, follow- up chest radiographs until resolution recommended to exclude underlying abnormalities including tumor.   Original Report Authenticated By: Lollie Marrow, M.D.     Scheduled Meds:    . alum & mag hydroxide-simeth  15 mL Oral TID AC  . amiodarone  200 mg Oral Daily  . antiseptic oral rinse  15 mL Mouth Rinse BID  . atorvastatin  10 mg Oral q1800  . carvedilol  6.25 mg Oral BID WC  . cycloSPORINE  1 drop Both Eyes BID  . dexlansoprazole  60  mg Oral Q1200  . fluconazole (DIFLUCAN) IV  100 mg Intravenous Q24H  . furosemide  40 mg Intravenous Once  . furosemide  40 mg Intravenous Once  . HYDROcodone-acetaminophen  1 tablet Oral TID  . metronidazole  500 mg Intravenous Q8H  . omega-3 acid ethyl esters  1 g Oral BID  . piperacillin-tazobactam (ZOSYN)  IV  3.375 g Intravenous Q8H  . potassium chloride  40 mEq Oral Once  . psyllium  1 packet Oral Daily  . sodium chloride  3 mL Intravenous Q12H  . vancomycin  1,000 mg Intravenous Q12H  . DISCONTD: metronidazole  500 mg Intravenous Q8H  . DISCONTD: piperacillin-tazobactam (ZOSYN)  IV  3.375 g Intravenous Q8H   Continuous Infusions:    Principal Problem:  *SIRS (systemic inflammatory response syndrome) Active Problems:  VENTRICULAR  TACHYCARDIA  Chronic atrial fibrillation  Chronic systolic heart failure- EF 35-40%  AUTOMATIC IMPLANTABLE CARDIAC DEFIBRILLATOR SITU  CAD (coronary artery disease)  Bradycardia  Anemia, iron deficiency  Ataxia  Dehydration  History of subdural hemorrhage  Hypotension  HCAP (healthcare-associated pneumonia)  Thrombocytopenia  Weakness generalized  Diarrhea  Hypophosphatemia  Chest wall discomfort  Acute on chronic systolic CHF (congestive heart failure)        Mattison Golay  [facility] Pager [phone]. If 8PM-8AM, please contact night-coverage at [email or web address], password [facility] 10/02/2011, 8:48 AM  LOS: 9 days

## 2011-10-03 DIAGNOSIS — J90 Pleural effusion, not elsewhere classified: Secondary | ICD-10-CM | POA: Diagnosis present

## 2011-10-03 LAB — BASIC METABOLIC PANEL
BUN: 11 mg/dL (ref 6–23)
CO2: 32 mEq/L (ref 19–32)
Chloride: 100 mEq/L (ref 96–112)
Creatinine, Ser: 1.16 mg/dL (ref 0.50–1.35)
Glucose, Bld: 86 mg/dL (ref 70–99)
Potassium: 3.6 mEq/L (ref 3.5–5.1)

## 2011-10-03 LAB — CBC
HCT: 33.6 % — ABNORMAL LOW (ref 39.0–52.0)
Hemoglobin: 10.9 g/dL — ABNORMAL LOW (ref 13.0–17.0)
MCV: 88 fL (ref 78.0–100.0)
RBC: 3.82 MIL/uL — ABNORMAL LOW (ref 4.22–5.81)
RDW: 17.3 % — ABNORMAL HIGH (ref 11.5–15.5)
WBC: 13.1 10*3/uL — ABNORMAL HIGH (ref 4.0–10.5)

## 2011-10-03 MED ORDER — VANCOMYCIN HCL 500 MG IV SOLR
500.0000 mg | Freq: Two times a day (BID) | INTRAVENOUS | Status: DC
  Administered 2011-10-04 (×2): 500 mg via INTRAVENOUS
  Filled 2011-10-03 (×3): qty 500

## 2011-10-03 MED ORDER — DEXLANSOPRAZOLE 60 MG PO CPDR: 60.0000 mg | DELAYED_RELEASE_CAPSULE | Freq: Every day | ORAL | Status: DC

## 2011-10-03 MED ORDER — DEXLANSOPRAZOLE 60 MG PO CPDR
60.0000 mg | DELAYED_RELEASE_CAPSULE | Freq: Every day | ORAL | Status: DC
  Administered 2011-10-03: 60 mg via ORAL
  Filled 2011-10-03 (×2): qty 1

## 2011-10-03 MED ORDER — CARVEDILOL 12.5 MG PO TABS
12.5000 mg | ORAL_TABLET | Freq: Two times a day (BID) | ORAL | Status: DC
  Administered 2011-10-03 – 2011-10-11 (×9): 12.5 mg via ORAL
  Filled 2011-10-03 (×21): qty 1

## 2011-10-03 MED ORDER — FUROSEMIDE 10 MG/ML IJ SOLN
40.0000 mg | Freq: Two times a day (BID) | INTRAMUSCULAR | Status: DC
  Administered 2011-10-03 – 2011-10-04 (×3): 40 mg via INTRAVENOUS
  Filled 2011-10-03 (×7): qty 4

## 2011-10-03 MED ORDER — POTASSIUM CHLORIDE CRYS ER 20 MEQ PO TBCR
40.0000 meq | EXTENDED_RELEASE_TABLET | Freq: Once | ORAL | Status: AC
  Administered 2011-10-03: 40 meq via ORAL
  Filled 2011-10-03: qty 2

## 2011-10-03 NOTE — Progress Notes (Signed)
ANTIBIOTIC CONSULT NOTE - FOLLOW UP  Pharmacy Consult for Vancomycin and Zosyn Indication: pneumonia  Allergies  Allergen Reactions  . Celebrex (Celecoxib) Hives and Other (See Comments)    Gi upset  . Digoxin Other (See Comments)    Unknown   . Esomeprazole Magnesium Hives    "don't really remember"  . Multaq (Dronedarone Hydrochloride) Other (See Comments)    "don't remember"  . Protonix (Pantoprazole Sodium) Nausea And Vomiting    Tolerates Dexilant    Patient Measurements: Height: 6' (182.9 cm) Weight: 201 lb 6.4 oz (91.354 kg) IBW/kg (Calculated) : 77.6   Vital Signs: Temp: 98.1 F (36.7 C) (09/02 1000) Temp src: Oral (09/02 1000) BP: 112/74 mmHg (09/02 1000) Pulse Rate: 107  (09/02 1000) Intake/Output from previous day: 09/01 0701 - 09/02 0700 In: 1120 [P.O.:920; IV Piggyback:200] Out: 475 [Urine:475] Intake/Output from this shift: Total I/O In: 240 [P.O.:240] Out: -   Labs:  Basename 10/03/11 0505 10/02/11 0538 10/01/11 0500  WBC 13.1* 15.4* 18.4*  HGB 10.9* 10.8* 11.1*  PLT 241 215 188  LABCREA -- -- --  CREATININE 1.16 1.06 0.90   Estimated Creatinine Clearance: 59.5 ml/min (by C-G formula based on Cr of 1.16).  Basename 10/03/11 1047  VANCOTROUGH 29.9*  VANCOPEAK --  Drue Dun --  GENTTROUGH --  GENTPEAK --  GENTRANDOM --  TOBRATROUGH --  TOBRAPEAK --  TOBRARND --  AMIKACINPEAK --  AMIKACINTROU --  AMIKACIN --     Microbiology: Recent Results (from the past 720 hour(s))  CULTURE, BLOOD (ROUTINE X 2)     Status: Normal   Collection Time   09/23/11  4:40 PM      Component Value Range Status Comment   Specimen Description BLOOD HAND LEFT   Final    Special Requests BOTTLES DRAWN AEROBIC ONLY 10CC   Final    Culture  Setup Time 09/23/2011 21:08   Final    Culture NO GROWTH 5 DAYS   Final    Report Status 09/29/2011 FINAL   Final   CULTURE, BLOOD (ROUTINE X 2)     Status: Normal   Collection Time   09/23/11  4:50 PM      Component  Value Range Status Comment   Specimen Description BLOOD HAND LEFT   Final    Special Requests BOTTLES DRAWN AEROBIC ONLY 10CC   Final    Culture  Setup Time 09/23/2011 21:08   Final    Culture NO GROWTH 5 DAYS   Final    Report Status 09/29/2011 FINAL   Final   MRSA PCR SCREENING     Status: Abnormal   Collection Time   09/23/11  6:03 PM      Component Value Range Status Comment   MRSA by PCR POSITIVE (*) NEGATIVE Final   URINE CULTURE     Status: Normal   Collection Time   09/23/11  6:39 PM      Component Value Range Status Comment   Specimen Description URINE, CATHETERIZED   Final    Special Requests NONE   Final    Culture  Setup Time 09/23/2011 21:02   Final    Colony Count NO GROWTH   Final    Culture NO GROWTH   Final    Report Status 09/25/2011 FINAL   Final   CULTURE, EXPECTORATED SPUTUM-ASSESSMENT     Status: Normal   Collection Time   09/23/11  8:39 PM      Component Value Range Status Comment  Specimen Description SPUTUM   Final    Special Requests NONE   Final    Sputum evaluation     Final    Value: THIS SPECIMEN IS ACCEPTABLE. RESPIRATORY CULTURE REPORT TO FOLLOW.   Report Status 09/23/2011 FINAL   Final   CULTURE, RESPIRATORY     Status: Normal   Collection Time   09/23/11  8:39 PM      Component Value Range Status Comment   Specimen Description SPUTUM   Final    Special Requests NONE   Final    Gram Stain     Final    Value: FEW WBC PRESENT,BOTH PMN AND MONONUCLEAR     RARE SQUAMOUS EPITHELIAL CELLS PRESENT     RARE GRAM POSITIVE COCCI     IN PAIRS IN CLUSTERS RARE GRAM POSITIVE RODS   Culture NORMAL OROPHARYNGEAL FLORA   Final    Report Status 09/26/2011 FINAL   Final   CLOSTRIDIUM DIFFICILE BY PCR     Status: Normal   Collection Time   09/24/11 11:56 AM      Component Value Range Status Comment   C difficile by pcr NEGATIVE  NEGATIVE Final   CULTURE, EXPECTORATED SPUTUM-ASSESSMENT     Status: Normal   Collection Time   09/29/11  1:15 PM      Component  Value Range Status Comment   Specimen Description Expect. Sput   Final    Special Requests Normal   Final    Sputum evaluation     Final    Value: THIS SPECIMEN IS ACCEPTABLE. RESPIRATORY CULTURE REPORT TO FOLLOW.   Report Status 09/29/2011 FINAL   Final   CULTURE, RESPIRATORY     Status: Normal   Collection Time   09/29/11  1:15 PM      Component Value Range Status Comment   Specimen Description SPUTUM   Final    Special Requests NONE   Final    Gram Stain     Final    Value: FEW WBC PRESENT, PREDOMINANTLY PMN     RARE SQUAMOUS EPITHELIAL CELLS PRESENT     NO ORGANISMS SEEN   Culture FEW YEAST CONSISTENT WITH CANDIDA SPECIES   Final    Report Status 10/01/2011 FINAL   Final   CLOSTRIDIUM DIFFICILE BY PCR     Status: Normal   Collection Time   10/01/11 12:23 PM      Component Value Range Status Comment   C difficile by pcr NEGATIVE  NEGATIVE Final     Anti-infectives     Start     Dose/Rate Route Frequency Ordered Stop   10/01/11 2200   metroNIDAZOLE (FLAGYL) IVPB 500 mg        500 mg 100 mL/hr over 60 Minutes Intravenous 3 times per day 10/01/11 1727     10/01/11 1000   metroNIDAZOLE (FLAGYL) IVPB 500 mg  Status:  Discontinued        500 mg 100 mL/hr over 60 Minutes Intravenous Every 8 hours 10/01/11 0956 10/01/11 1727   10/01/11 0000   piperacillin-tazobactam (ZOSYN) IVPB 3.375 g        3.375 g 12.5 mL/hr over 240 Minutes Intravenous 3 times per day 10/01/11 1726     09/30/11 1400   fluconazole (DIFLUCAN) IVPB 100 mg        100 mg 50 mL/hr over 60 Minutes Intravenous Every 24 hours 09/30/11 1314     09/29/11 1200   vancomycin (VANCOCIN) IVPB 1000 mg/200 mL premix  1,000 mg 200 mL/hr over 60 Minutes Intravenous Every 12 hours 09/29/11 1116     09/29/11 1200   piperacillin-tazobactam (ZOSYN) IVPB 3.375 g  Status:  Discontinued        3.375 g 12.5 mL/hr over 240 Minutes Intravenous 3 times per day 09/29/11 1116 10/01/11 1726   09/28/11 2200    amoxicillin-clavulanate (AUGMENTIN) 875-125 MG per tablet 1 tablet  Status:  Discontinued        1 tablet Oral Every 12 hours 09/28/11 2032 09/29/11 1106   09/28/11 2200   azithromycin (ZITHROMAX) tablet 500 mg  Status:  Discontinued        500 mg Oral Every 24 hours 09/28/11 2032 09/29/11 1106   09/25/11 1100   vancomycin (VANCOCIN) IVPB 1000 mg/200 mL premix  Status:  Discontinued        1,000 mg 200 mL/hr over 60 Minutes Intravenous 2 times daily 09/25/11 1046 09/28/11 2031   09/24/11 2000   vancomycin (VANCOCIN) IVPB 1000 mg/200 mL premix  Status:  Discontinued        1,000 mg 200 mL/hr over 60 Minutes Intravenous Every 24 hours 09/23/11 1818 09/25/11 1046   09/24/11 1400   piperacillin-tazobactam (ZOSYN) IVPB 3.375 g  Status:  Discontinued        3.375 g 12.5 mL/hr over 240 Minutes Intravenous 3 times per day 09/24/11 1131 09/28/11 2031   09/23/11 2359   piperacillin-tazobactam (ZOSYN) IVPB 2.25 g  Status:  Discontinued        2.25 g 100 mL/hr over 30 Minutes Intravenous 4 times per day 09/23/11 1756 09/24/11 1131   09/23/11 1815   piperacillin-tazobactam (ZOSYN) IVPB 3.375 g        3.375 g 100 mL/hr over 30 Minutes Intravenous  Once 09/23/11 1812 09/23/11 1848   09/23/11 1530   vancomycin (VANCOCIN) IVPB 1000 mg/200 mL premix        1,000 mg 200 mL/hr over 60 Minutes Intravenous  Once 09/23/11 1522 09/23/11 1920   09/23/11 1530   piperacillin-tazobactam (ZOSYN) IVPB 3.375 g  Status:  Discontinued        3.375 g 12.5 mL/hr over 240 Minutes Intravenous  Once 09/23/11 1522 09/23/11 1812          Assessment: 78 yom on Vancomycin and Zosyn Day 5 for empiric HCAP coverage. Patient was initially on Vancomycin and Zosyn from 8/23 to 8/28 but was then restarted on 8/29 for worsening SOB and CP. Diflucan added to regimen. Patient has remained afebrile and WBC are trending down. Patient's renal function has remained fairly stable during admission. Vancomycin previously therapeutic  (18.8) on 1g q12h. Today's trough is 29.9 indicating accumulation.  Goal of Therapy:  Vancomycin trough level 15-20 mcg/ml  Plan:  -Change vancomycin to 500 mg IV q12h - next 24:00 -Check vancomycin trough at steady state if continued -Continue Zosyn 3.375g IV q8h -Monitor renal function, cultures, plan to narrow/simplify antibiotic regimen   [facility], [place].D., BCPS Clinical Pharmacist Pager: [phone] 10/03/2011 12:48 PM

## 2011-10-03 NOTE — Progress Notes (Signed)
[facility] PROGRESS NOTE  [name] QIO:[id number] DOB: [birth year] DOA: 09/23/2011 PCP: Hoyle Sauer, MD  Assessment/Plan: Principal Problem:  *SIRS (systemic inflammatory response syndrome) Active Problems:  VENTRICULAR TACHYCARDIA  Chronic atrial fibrillation  Chronic systolic heart failure- EF 35-40%  AUTOMATIC IMPLANTABLE CARDIAC DEFIBRILLATOR SITU  CAD (coronary artery disease)  Bradycardia  Anemia, iron deficiency  Ataxia  Dehydration  History of subdural hemorrhage  Hypotension  HCAP (healthcare-associated pneumonia)  Thrombocytopenia  Weakness generalized  Diarrhea  Hypophosphatemia  Chest wall discomfort  Acute on chronic systolic CHF (congestive heart failure)  Pleural effusion  Acute on chronic systolic CHF - pulmonary edema - volume overload - stopped iv fluids 8/30 - started iv lasix 8/30  Sepsis - Resolved after boluses of iv fluids and stress dose steroids given on admission.  - Discontinued stress dose steroids on 8/28.  Given that he has only had 5 days of steroid therapy he likely has not had adrenal suppresion and hence steroids were discontinue 8/28.  HAP: - patient was placed on iv abx on admission (Vanc and Zosyn) He did develop resp failure, severe hypoxia, and had to have ICU stay - he has improved gradually. The pleural efussion is felt to be chronic based on Ct findings.  - needs outpt CXR f/u to assure resolution of infiltrate  - CT scan with RUL mass - suspected pneumonia but we may need to r/o malignancy if he does not improve   Pleural efussion - persistent - will have IR guided diagnostic thoracentesis prior to DC.  F/u with Dr. Shelle Iron in the office    2. Chronic Atrial Fibrillation - Patient is on amiodarone - Add carvedilol at low dose.  Titrate up as tolerated. - on 8/29 increased dose of carvedilol to 6.25 on 10/03/11 titrated up to 12.5 BID - NOT a COUMADIN candidate due to spontaneous SDH in 08/2011  3. Dehydration  on admission  - Likely contributing to elevated heart rates and ARF. - Patient eating and drinking well so iv fluids were discontinued 8/30  4. Anemia - Stable currently.  No active bleeding.  May have been myelosuppression secondary to recent infection vs chronic anemia.  5. Diarrhea - C diff pcr negative 8/24 and 10/01/11 - Likely contributed to low phosphorus. - no significant amounts noted during hospitalization  - empiric flagyl started 8/31   6. Hypophosphatemia - Likely secondary to # 5 repleted iv   7. ARF - resolved with IV fluid rehydration.  May have been secondary to dehydration and sepsis.     8. Chest wall discomfort - At this point reproducible on palpation.   - EKG reviewed with no ST elevations or depressions.  Shows afib at rate of 107 - CT of chest shows RUL consolidation consistent with PNA - Continue with tramadol for pain relief.  9. AICD - patient thinks the device has fired recently - but the [employer] rep has not found objective evidence in regards to that.   Code Status: Full Family CommunicationHayden Rasmussen Daughter [phone] [name],[name] Relative [phone]  Disposition Plan: home with home health    Brief narrative: 78 year old male patient with multiple medical problems. Recent hospital admission July 2013 because of subdural hematoma. A CT scan of the chest done during that admission to rule out any form of malignancy demonstrated a moderate right-sided pleural effusion that subsequently resolved. He presented back to the hospital on 09/23/2011 and was found to be hypotensive, volume depleted and had right upper lobe healthcare acquired  pneumonia. He was aggressively volume resuscitated and initiated on Zosyn and vancomycin. Because of persistent hypotension and worsening hypoxemia pulmonary critical care medicine assumed care of the patient shortly after admission. Patient did require pressor support briefly but was  rapidly weaned. Subsequently he has improved significantly and stabilized enough to transfer out of the ICU to step down on 09/25/2011. [facility] assumed care of the patient on 09/26/2011.  Patient was transferred to 6700 on 8/27 and is currently receiving IV antibiotics. Was tried to transition to oral antibiotics but had increase in WBC and placed back on IV antibiotics.  Chest discomfort was reported but seems musculoskeletal.  Consultants: Pulmonary critical care medicine-signed off 09/25/2011  Procedures: Right subclavian central line 09/23/2011 >>> plan discontinue 09/26/2011  Antibiotics: 1) Zosyn (09/23/11) -8/28 then 8/29- 2) Vancomycin (09/23/11) -8/28 then 8/29- 3)Augmentin 8/28-8/29 4)Zithromax 8/28-8/29 5)Diflucan 8/30- 6)Flagyl 8/31 -    HPI/Subjective: Less dyspnea , but still with right side chest pain and cough with clear mucoid sputum production   Objective: Filed Vitals:   10/02/11 2015 10/03/11 0511 10/03/11 1000 10/03/11 1400  BP: 110/64 107/52 112/74 116/68  Pulse: 96 92 107 98  Temp: 97.9 F (36.6 C) 97.6 F (36.4 C) 98.1 F (36.7 C) 98.2 F (36.8 C)  TempSrc: Oral Oral Oral Oral  Resp: 18 18 18 18   Height:      Weight: 91.354 kg (201 lb 6.4 oz)     SpO2: 97% 92% 98% 96%    Intake/Output Summary (Last 24 hours) at 10/03/11 1501 Last data filed at 10/03/11 1300  Gross per 24 hour  Intake   1120 ml  Output     50 ml  Net   1070 ml   Filed Weights   09/30/11 2044 10/01/11 2121 10/02/11 2015  Weight: 90.4 kg (199 lb 4.7 oz) 90.1 kg (198 lb 10.2 oz) 91.354 kg (201 lb 6.4 oz)    Exam:   General:  Pt in NAD, A and O x 3  Cardiovascular: irregularly irregular,tachycardic no murmurs  Musculoskeletal:  Pain with palpation over right chest.  Respiratory: Mild rhales at right lung field with decreased BS at right lung base, no Wheezes, sputum clear   Abdomen: Soft, NT, ND  Extremities with +2 edema - LE>UE  Data Reviewed: Basic Metabolic  Panel:  Lab 10/03/11 0505 10/02/11 0538 10/01/11 0500 09/30/11 0655 09/28/11 0530  NA 140 140 140 141 142  K 3.6 3.3* 3.6 3.4* 3.9  CL 100 101 103 105 107  CO2 32 32 28 28 27   GLUCOSE 86 106* 103* 103* 139*  BUN 11 12 12 16  26*  CREATININE 1.16 1.06 0.90 0.87 0.94  CALCIUM 8.1* 7.8* 7.9* 8.2* 8.3*  MG -- -- -- -- --  PHOS -- -- -- -- 3.3   Liver Function Tests: No results found for this basename: AST:5,ALT:5,ALKPHOS:5,BILITOT:5,PROT:5,ALBUMIN:5 in the last 168 hours No results found for this basename: LIPASE:5,AMYLASE:5 in the last 168 hours No results found for this basename: AMMONIA:5 in the last 168 hours CBC:  Lab 10/03/11 0505 10/02/11 0538 10/01/11 0500 09/30/11 0944 09/29/11 0535  WBC 13.1* 15.4* 18.4* 21.9* 18.2*  NEUTROABS -- -- -- -- --  HGB 10.9* 10.8* 11.1* 11.9* 12.1*  HCT 33.6* 33.5* 34.6* 36.7* 37.4*  MCV 88.0 87.9 88.0 88.4 88.0  PLT 241 215 188 179 184   Cardiac Enzymes:  Lab 09/29/11 0535  CKTOTAL 32  CKMB 2.2  CKMBINDEX --  TROPONINI <0.30   BNP (last  3 results)  Basename 08/12/11 1604 11/29/10 0500 11/28/10 1338  PROBNP 422.7 1050.0* 1356.0*   CBG: No results found for this basename: GLUCAP:5 in the last 168 hours  Recent Results (from the past 240 hour(s))  CULTURE, BLOOD (ROUTINE X 2)     Status: Normal   Collection Time   09/23/11  4:40 PM      Component Value Range Status Comment   Specimen Description BLOOD HAND LEFT   Final    Special Requests BOTTLES DRAWN AEROBIC ONLY 10CC   Final    Culture  Setup Time 09/23/2011 21:08   Final    Culture NO GROWTH 5 DAYS   Final    Report Status 09/29/2011 FINAL   Final   CULTURE, BLOOD (ROUTINE X 2)     Status: Normal   Collection Time   09/23/11  4:50 PM      Component Value Range Status Comment   Specimen Description BLOOD HAND LEFT   Final    Special Requests BOTTLES DRAWN AEROBIC ONLY 10CC   Final    Culture  Setup Time 09/23/2011 21:08   Final    Culture NO GROWTH 5 DAYS   Final    Report  Status 09/29/2011 FINAL   Final   MRSA PCR SCREENING     Status: Abnormal   Collection Time   09/23/11  6:03 PM      Component Value Range Status Comment   MRSA by PCR POSITIVE (*) NEGATIVE Final   URINE CULTURE     Status: Normal   Collection Time   09/23/11  6:39 PM      Component Value Range Status Comment   Specimen Description URINE, CATHETERIZED   Final    Special Requests NONE   Final    Culture  Setup Time 09/23/2011 21:02   Final    Colony Count NO GROWTH   Final    Culture NO GROWTH   Final    Report Status 09/25/2011 FINAL   Final   CULTURE, EXPECTORATED SPUTUM-ASSESSMENT     Status: Normal   Collection Time   09/23/11  8:39 PM      Component Value Range Status Comment   Specimen Description SPUTUM   Final    Special Requests NONE   Final    Sputum evaluation     Final    Value: THIS SPECIMEN IS ACCEPTABLE. RESPIRATORY CULTURE REPORT TO FOLLOW.   Report Status 09/23/2011 FINAL   Final   CULTURE, RESPIRATORY     Status: Normal   Collection Time   09/23/11  8:39 PM      Component Value Range Status Comment   Specimen Description SPUTUM   Final    Special Requests NONE   Final    Gram Stain     Final    Value: FEW WBC PRESENT,BOTH PMN AND MONONUCLEAR     RARE SQUAMOUS EPITHELIAL CELLS PRESENT     RARE GRAM POSITIVE COCCI     IN PAIRS IN CLUSTERS RARE GRAM POSITIVE RODS   Culture NORMAL OROPHARYNGEAL FLORA   Final    Report Status 09/26/2011 FINAL   Final   CLOSTRIDIUM DIFFICILE BY PCR     Status: Normal   Collection Time   09/24/11 11:56 AM      Component Value Range Status Comment   C difficile by pcr NEGATIVE  NEGATIVE Final   CULTURE, EXPECTORATED SPUTUM-ASSESSMENT     Status: Normal   Collection Time   09/29/11  1:15 PM      Component Value Range Status Comment   Specimen Description Expect. Sput   Final    Special Requests Normal   Final    Sputum evaluation     Final    Value: THIS SPECIMEN IS ACCEPTABLE. RESPIRATORY CULTURE REPORT TO FOLLOW.   Report Status  09/29/2011 FINAL   Final   CULTURE, RESPIRATORY     Status: Normal   Collection Time   09/29/11  1:15 PM      Component Value Range Status Comment   Specimen Description SPUTUM   Final    Special Requests NONE   Final    Gram Stain     Final    Value: FEW WBC PRESENT, PREDOMINANTLY PMN     RARE SQUAMOUS EPITHELIAL CELLS PRESENT     NO ORGANISMS SEEN   Culture FEW YEAST CONSISTENT WITH CANDIDA SPECIES   Final    Report Status 10/01/2011 FINAL   Final   CLOSTRIDIUM DIFFICILE BY PCR     Status: Normal   Collection Time   10/01/11 12:23 PM      Component Value Range Status Comment   C difficile by pcr NEGATIVE  NEGATIVE Final      Studies: Dg Chest Port 1 View  09/24/2011  *RADIOLOGY REPORT*  Clinical Data: Right-sided central line placement.  PORTABLE CHEST - 1 VIEW  Comparison: 09/23/2011  Findings: Interval right subclavian approach central venous catheter placement.  The the catheter projects over the SVC however I cannot confirm tip position due to the AICD leads.  Again noted is a right upper lobe consolidation.  Aortic arch atherosclerosis. Heart size upper normal to mildly enlarged.  No interval osseous change.  No pneumothorax.  No definite pleural effusion.  IMPRESSION: Right subclavian approach catheter projects over the SVC, however I cannot confirm tip position as it is obscured by the AICD leads.  Right upper lobe consolidation is most in keeping with pneumonia. Recommend radiographic follow-up after treatment to document resolution.   Original Report Authenticated By: Waneta Martins, M.D.    Dg Abd Acute W/chest  09/23/2011  *RADIOLOGY REPORT*  Clinical Data: Weakness, diarrhea, history hypertension, CHF, coronary disease post MI and CABG, asthma, diabetes  ACUTE ABDOMEN SERIES (ABDOMEN 2 VIEW & CHEST 1 VIEW)  Comparison: 08/17/2011  Findings: Left subclavian transvenous AICD leads project over right atrium and right ventricle. Epicardial pacing wires and defibrillator leads  present. Upper normal heart size post median sternotomy. Atherosclerotic calcification of a tortuous thoracic aorta. Right upper lobe infiltrate consistent with pneumonia. Remaining lungs clear. Bones demineralized.  Surgical clips right upper quadrant question cholecystectomy. Nonobstructive bowel gas pattern. No bowel dilatation, bowel wall thickening, or free intraperitoneal air. Calcifications project over the kidneys bilaterally question renal calculi. Bilateral pelvic phleboliths and atherosclerotic calcifications. Degenerative changes lumbar spine and bilateral hip joints.  IMPRESSION: Nonobstructive bowel gas pattern. Question bilateral renal calculi. Right upper lobe pneumonia. Due to the more focal parenchymal opacity at the right apex, follow- up chest radiographs until resolution recommended to exclude underlying abnormalities including tumor.   Original Report Authenticated By: Lollie Marrow, M.D.     Scheduled Meds:    . alum & mag hydroxide-simeth  15 mL Oral TID AC  . amiodarone  200 mg Oral Daily  . antiseptic oral rinse  15 mL Mouth Rinse BID  . atorvastatin  10 mg Oral q1800  . carvedilol  12.5 mg Oral BID WC  . cycloSPORINE  1 drop  Both Eyes BID  . dexlansoprazole  60 mg Oral Q1200  . fluconazole (DIFLUCAN) IV  100 mg Intravenous Q24H  . furosemide  40 mg Intravenous BID  . HYDROcodone-acetaminophen  1 tablet Oral TID  . metronidazole  500 mg Intravenous Q8H  . omega-3 acid ethyl esters  1 g Oral BID  . piperacillin-tazobactam (ZOSYN)  IV  3.375 g Intravenous Q8H  . potassium chloride  40 mEq Oral Once  . psyllium  1 packet Oral Daily  . sodium chloride  3 mL Intravenous Q12H  . vancomycin  500 mg Intravenous Q12H  . DISCONTD: carvedilol  6.25 mg Oral BID WC  . DISCONTD: dexlansoprazole  60 mg Oral Q1200  . DISCONTD: dexlansoprazole  60 mg Oral Q1200  . DISCONTD: dexlansoprazole  60 mg Oral Q1200  . DISCONTD: vancomycin  1,000 mg Intravenous Q12H   Continuous  Infusions:    Principal Problem:  *SIRS (systemic inflammatory response syndrome) Active Problems:  VENTRICULAR TACHYCARDIA  Chronic atrial fibrillation  Chronic systolic heart failure- EF 35-40%  AUTOMATIC IMPLANTABLE CARDIAC DEFIBRILLATOR SITU  CAD (coronary artery disease)  Bradycardia  Anemia, iron deficiency  Ataxia  Dehydration  History of subdural hemorrhage  Hypotension  HCAP (healthcare-associated pneumonia)  Thrombocytopenia  Weakness generalized  Diarrhea  Hypophosphatemia  Chest wall discomfort  Acute on chronic systolic CHF (congestive heart failure)  Pleural effusion        [name]  [facility] Pager [phone]. If 8PM-8AM, please contact night-coverage at [email or web address], password [facility] 10/03/2011, 3:01 PM  LOS: 10 days

## 2011-10-03 NOTE — Progress Notes (Signed)
CRITICAL VALUE ALERT  Critical value received:  vanc trough  Date of notification:  10/03/2011  Time of notification:  12:16  Critical value read back:yes  Nurse who received alert:  Allena Earing  MD notified (1st page):  Lucilla Edin, Pharm D notified clinical Pharm to hold dose  Time of first page:  12:18  MD notified (2nd page):  Time of second page:  Responding MD:    Time MD responded:

## 2011-10-03 NOTE — Progress Notes (Signed)
Physical Therapy Treatment Patient Details Name: [name] MRN: [id number] DOB: 12/20/1932 Today's Date: 10/03/2011 Time: 4098-1191 PT Time Calculation (min): 25 min  PT Assessment / Plan / Recommendation Comments on Treatment Session  Good progession of amb distance and activity tolerance; Still unsteady with unilateral device -- at this point, RW continues to be most appropriate device for amb    Follow Up Recommendations  Home health PT;Supervision - Intermittent    Barriers to Discharge        Equipment Recommendations  Other (comment) (Does pt have RW?)    Recommendations for Other Services    Frequency Min 3X/week   Plan Discharge plan remains appropriate    Precautions / Restrictions Precautions Precautions: Fall   Pertinent Vitals/Pain No Specific reports of pain    Mobility  Transfers Transfers: Sit to Stand;Stand to Sit Sit to Stand: 4: Min guard;From chair/3-in-1;With upper extremity assist (without physical contact) Stand to Sit: 4: Min guard;To chair/3-in-1;With armrests (without physical contact) Details for Transfer Assistance: Cues for safety, hand placement, technique   Ambulation/Gait Ambulation/Gait Assistance: 4: Min guard (without physical contact) Ambulation Distance (Feet): 100 Feet Assistive device: Rolling walker;Small based quad cane Ambulation/Gait Assistance Details: Seemed to require closer guard assist today with quad cane secondary to unsteadiness; Doing much better with RW Gait Pattern: Step-through pattern Gait velocity: decreased    Exercises     PT Diagnosis:    PT Problem List:   PT Treatment Interventions:     PT Goals Acute Rehab PT Goals Time For Goal Achievement: 10/08/11 Potential to Achieve Goals: Good Pt will go Sit to Stand: with modified independence;with upper extremity assist PT Goal: Sit to Stand - Progress: Progressing toward goal Pt will go Stand to Sit: with modified independence;with upper extremity  assist PT Goal: Stand to Sit - Progress: Progressing toward goal Pt will Ambulate: 51 - 150 feet;with modified independence;with least restrictive assistive device PT Goal: Ambulate - Progress: Progressing toward goal  Visit Information  Last PT Received On: 10/03/11 Assistance Needed: +1    Subjective Data  Subjective: Shrugs when asked how he is feeling; Agreeable to amb   Cognition  Overall Cognitive Status: Appears within functional limits for tasks assessed/performed Arousal/Alertness: Awake/alert Orientation Level: Appears intact for tasks assessed Behavior During Session: [facility] for tasks performed    Balance     End of Session PT - End of Session Equipment Utilized During Treatment: Gait belt Activity Tolerance: Patient tolerated treatment well Patient left: Other (comment);with call bell/phone within reach (on [facility] ) Nurse Communication: Mobility status   GP     Van Clines [facility] [place], [place] [phone]  10/03/2011, 1:01 PM

## 2011-10-04 ENCOUNTER — Inpatient Hospital Stay (HOSPITAL_COMMUNITY): Payer: Medicare Other

## 2011-10-04 DIAGNOSIS — J189 Pneumonia, unspecified organism: Secondary | ICD-10-CM | POA: Diagnosis present

## 2011-10-04 DIAGNOSIS — R601 Generalized edema: Secondary | ICD-10-CM | POA: Diagnosis present

## 2011-10-04 DIAGNOSIS — J90 Pleural effusion, not elsewhere classified: Secondary | ICD-10-CM

## 2011-10-04 LAB — BASIC METABOLIC PANEL
BUN: 11 mg/dL (ref 6–23)
Creatinine, Ser: 1.38 mg/dL — ABNORMAL HIGH (ref 0.50–1.35)
GFR calc Af Amer: 56 mL/min — ABNORMAL LOW (ref >90)
GFR calc non Af Amer: 48 mL/min — ABNORMAL LOW (ref >90)
Glucose, Bld: 106 mg/dL — ABNORMAL HIGH (ref 70–99)

## 2011-10-04 LAB — BODY FLUID CELL COUNT WITH DIFFERENTIAL
Eos, Fluid: 1 %
Lymphs, Fluid: 11 %
Monocyte-Macrophage-Serous Fluid: 77 % (ref 50–90)

## 2011-10-04 LAB — LACTATE DEHYDROGENASE, PLEURAL OR PERITONEAL FLUID: LD, Fluid: 144 U/L — ABNORMAL HIGH (ref 3–23)

## 2011-10-04 LAB — CBC
HCT: 34 % — ABNORMAL LOW (ref 39.0–52.0)
MCHC: 32.6 g/dL (ref 30.0–36.0)
MCV: 88.1 fL (ref 78.0–100.0)
RDW: 17.3 % — ABNORMAL HIGH (ref 11.5–15.5)

## 2011-10-04 LAB — GLUCOSE, SEROUS FLUID

## 2011-10-04 LAB — PROTEIN, BODY FLUID: Total protein, fluid: 2 g/dL

## 2011-10-04 MED ORDER — DEXLANSOPRAZOLE 60 MG PO CPDR
60.0000 mg | DELAYED_RELEASE_CAPSULE | Freq: Two times a day (BID) | ORAL | Status: DC
  Administered 2011-10-04 – 2011-10-11 (×14): 60 mg via ORAL
  Filled 2011-10-04 (×17): qty 1

## 2011-10-04 MED ORDER — FUROSEMIDE 10 MG/ML IJ SOLN
40.0000 mg | Freq: Every day | INTRAMUSCULAR | Status: DC
  Administered 2011-10-05: 40 mg via INTRAVENOUS
  Filled 2011-10-04 (×2): qty 4

## 2011-10-04 MED ORDER — ALUM & MAG HYDROXIDE-SIMETH 200-200-20 MG/5ML PO SUSP
15.0000 mL | ORAL | Status: DC | PRN
  Administered 2011-10-04 – 2011-10-11 (×7): 15 mL via ORAL
  Filled 2011-10-04 (×4): qty 30

## 2011-10-04 NOTE — Progress Notes (Signed)
Pt profile:  78 yowm admitted 8/23 with weakness, N/V, abd pain. Adm dx included RUL PNA, severe sepsis. Transferred to [facility] 8/26. Has subsequently developed large R effusion. Underwent thoracentesis 9/3  Active problems: Resolving RUL PNA  Severe sepsis resolved R pleural effusion - transudate by chemistries  Subj: No complaints  Obj: Filed Vitals:   10/04/11 1703  BP: 94/58  Pulse: 86  Temp: 97.7 F (36.5 C)  Resp: 17    Gen: chronically ill, NAD HEENT: WNL Neck: No JVD noted Chest: RUL rales, no wheezes Cardiac: RRR s M Abd: soft, NABS Ext: Severe LE edema, symmetric  BMET    Component Value Date/Time   NA 142 10/04/2011 0625   K 3.5 10/04/2011 0625   CL 100 10/04/2011 0625   CO2 35* 10/04/2011 0625   GLUCOSE 106* 10/04/2011 0625   BUN 11 10/04/2011 0625   CREATININE 1.38* 10/04/2011 0625   CALCIUM 8.4 10/04/2011 0625   GFRNONAA 48* 10/04/2011 0625   GFRAA 56* 10/04/2011 0625    CBC    Component Value Date/Time   WBC 14.7* 10/04/2011 0625   WBC 7.9 09/15/2011 0853   RBC 3.86* 10/04/2011 0625   RBC 4.31 09/15/2011 0853   HGB 11.1* 10/04/2011 0625   HGB 12.8* 09/15/2011 0853   HCT 34.0* 10/04/2011 0625   HCT 38.4 09/15/2011 0853   PLT 265 10/04/2011 0625   PLT 160 09/15/2011 0853   MCV 88.1 10/04/2011 0625   MCV 89.1 09/15/2011 0853   MCH 28.8 10/04/2011 0625   MCH 29.8 09/15/2011 0853   MCHC 32.6 10/04/2011 0625   MCHC 33.4 09/15/2011 0853   RDW 17.3* 10/04/2011 0625   RDW 17.1* 09/15/2011 0853   LYMPHSABS 0.8 09/23/2011 1400   LYMPHSABS 1.0 09/15/2011 0853   MONOABS 3.3* 09/23/2011 1400   MONOABS 1.6* 09/15/2011 0853   EOSABS 0.0 09/23/2011 1400   EOSABS 0.1 09/15/2011 0853   BASOSABS 0.0 09/23/2011 1400   BASOSABS 0.0 09/15/2011 0853    CXR: post thoracentesis - persistent RUL infiltrate, minimal residual effusion   IMPRESSION: Resolving RUL PNA, sepsis transudative R pl eff - likely due to CHF CHF with severe LE edema No evidence of active acute infectious process Persistent RUL  infiltrate is not unexpected in this setting and could take up to 3 or 4 more weeks to resolve  PLAN/RECS:  Discussed with Dr Lavera Guise D/C abx - he has received a total of 10 days Will need repeat CXR in 2-3 wks to ensure resolution of infiltrate Optimize mgmt of CHF - diurese as tolerated    PCCM will sign off. Please call if we can be of further assistance  Billy Fischer, MD ; [facility] [phone].  After 5:30 PM or weekends, call [phone]

## 2011-10-04 NOTE — Procedures (Signed)
US guided diagnostic right thoracentesis performed yielding 400 cc's yellow fluid ( maximum ordered). The fluid was sent to the lab for preordered studies. F/u CXR pending. No immediate complications.

## 2011-10-04 NOTE — Progress Notes (Signed)
[facility] PROGRESS NOTE  BARCLAY LENNOX ZOX:[id number] DOB: 20-Mar-1932 DOA: 09/23/2011 PCP: Hoyle Sauer, MD  Assessment/Plan: Principal Problem:  *SIRS (systemic inflammatory response syndrome) Active Problems:  VENTRICULAR TACHYCARDIA  Chronic atrial fibrillation  Chronic systolic heart failure- EF 35-40%  AUTOMATIC IMPLANTABLE CARDIAC DEFIBRILLATOR SITU  CAD (coronary artery disease)  Bradycardia  Anemia, iron deficiency  Ataxia  Dehydration  History of subdural hemorrhage  Hypotension  HCAP (healthcare-associated pneumonia)  Thrombocytopenia  Weakness generalized  Diarrhea  Hypophosphatemia  Chest wall discomfort  Acute on chronic systolic CHF (congestive heart failure)  Pleural effusion  Anasarca    Sepsis - Resolved after boluses of iv fluids and stress dose steroids given on admission.  - Discontinued stress dose steroids on 8/28.  Given that he has only had 5 days of steroid therapy he likely has not had adrenal suppresion and hence steroids were discontinue 8/28.  Dehydration on admission  - Patient eating and drinking well so iv fluids were discontinued 8/30   HCAP: - patient was placed on iv abx on admission (Vanc and Zosyn)  - He did develop resp failure, severe hypoxia, and had to have ICU stay - he has improved gradually. - needs outpt CXR f/u to assure resolution of infiltrate  - CT scan with RUL mass - suspected pneumonia but we may need to r/o malignancy if he does not improve - All antibiotics discontinued after a total of 11 days on September 3   Pleural efussion - persistent -  F/u with pulm in the office   The pleural efussion is felt to be chronic based on CT findings.  Pleural tap by IR 10/04/11 showing transudate    Chronic Atrial Fibrillation - Patient is on amiodarone - Added carvedilol at low dose.  Titrate up as tolerated. - on 8/29 increased dose of carvedilol to 6.25. On 10/03/11 titrated up to 12.5 BID - NOT a COUMADIN  candidate due to spontaneous SDH in 08/2011  Acute on chronic systolic CHF - pulmonary edema - volume overload - stopped iv fluids 8/30 - started iv lasix 8/30 Anemia - Stable currently.  No active bleeding.  May have been myelosuppression secondary to recent infection vs chronic anemia.  Diarrhea - C diff pcr negative 8/24 and 10/01/11 - Likely contributed to low phosphorus. - no significant amounts noted during hospitalization  - empiric flagyl and diflucan given 8/31until 9/3   Hypophosphatemia - Likely secondary to # 5 repleted iv    ARF - resolved with IV fluid rehydration.  May have been secondary to dehydration and sepsis.     8. Chest wall discomfort - At this point reproducible on palpation.   - EKG reviewed with no ST elevations or depressions.  Shows afib at rate of 107 - CT of chest shows RUL consolidation consistent with PNA - Continue with tramadol for pain relief.  S/p AICD  - patient thinks the device has fired recently - but the [employer] rep has not found objective evidence in regards to that.   Code Status: Full Family CommunicationHayden Rasmussen Daughter [phone] [name],[name] Relative [phone] Disposition Plan: home with home health    Brief narrative: 78 year old male patient with multiple medical problems. Recent hospital admission July 2013 because of subdural hematoma. A CT scan of the chest done during that admission to rule out any form of malignancy demonstrated a moderate right-sided pleural effusion that subsequently resolved. He presented back to the hospital on 09/23/2011 and was found to be hypotensive, volume depleted  and had right upper lobe healthcare acquired pneumonia. He was aggressively volume resuscitated and initiated on Zosyn and vancomycin. Because of persistent hypotension and worsening hypoxemia pulmonary critical care medicine assumed care of the patient shortly after admission. Patient did require  pressor support briefly but was rapidly weaned. Subsequently he has improved significantly and stabilized enough to transfer out of the ICU to step down on 09/25/2011. [facility] assumed care of the patient on 09/26/2011.  Patient was transferred to 6700 on 8/27 and is currently receiving IV antibiotics. Was tried to transition to oral antibiotics but had increase in WBC and placed back on IV antibiotics.  Chest discomfort was reported but seems musculoskeletal.  Consultants: Pulmonary critical care medicine  Procedures: Right subclavian central line 09/23/2011 >>> plan discontinue 09/26/2011  Antibiotics: 1) Zosyn (09/23/11) -8/28 then 8/29-9/3 2) Vancomycin (09/23/11) -8/28 then 8/29-9/3 3)Augmentin 8/28-8/29 4)Zithromax 8/28-8/29 5)Diflucan 8/30-9/3 6)Flagyl 8/31 -  9/3  HPI/Subjective: Less dyspnea , but still with right side chest pain and cough with clear mucoid sputum production   Objective: Filed Vitals:   10/03/11 1750 10/03/11 2114 10/04/11 0606 10/04/11 0958  BP: 135/63 91/74 100/51 113/77  Pulse: 93 93 89 110  Temp: 97.8 F (36.6 C) 97.8 F (36.6 C) 97.5 F (36.4 C) 97 F (36.1 C)  TempSrc: Oral Oral Oral   Resp: 18 18 18 19   Height:  6' (1.829 m)    Weight:  91.354 kg (201 lb 6.4 oz)    SpO2: 99% 96% 100% 99%    Intake/Output Summary (Last 24 hours) at 10/04/11 1037 Last data filed at 10/04/11 0958  Gross per 24 hour  Intake    804 ml  Output   1250 ml  Net   -446 ml   Filed Weights   10/01/11 2121 10/02/11 2015 10/03/11 2114  Weight: 90.1 kg (198 lb 10.2 oz) 91.354 kg (201 lb 6.4 oz) 91.354 kg (201 lb 6.4 oz)    Exam:   General:  Pt in NAD, A and O x 3  Cardiovascular: irregularly irregular,tachycardic no murmurs  Musculoskeletal:  Pain with palpation over right chest.  Respiratory: Mild rhales at right lung field with decreased BS at right lung base, no Wheezes, sputum clear   Abdomen: Soft, NT, ND  Extremities with +2 edema - LE>UE  Data  Reviewed: Basic Metabolic Panel:  Lab 10/04/11 9147 10/03/11 0505 10/02/11 0538 10/01/11 0500 09/30/11 0655 09/28/11 0530  NA 142 140 140 140 141 --  K 3.5 3.6 3.3* 3.6 3.4* --  CL 100 100 101 103 105 --  CO2 35* 32 32 28 28 --  GLUCOSE 106* 86 106* 103* 103* --  BUN 11 11 12 12 16  --  CREATININE 1.38* 1.16 1.06 0.90 0.87 --  CALCIUM 8.4 8.1* 7.8* 7.9* 8.2* --  MG -- -- -- -- -- --  PHOS -- -- -- -- -- 3.3   Liver Function Tests: No results found for this basename: AST:5,ALT:5,ALKPHOS:5,BILITOT:5,PROT:5,ALBUMIN:5 in the last 168 hours No results found for this basename: LIPASE:5,AMYLASE:5 in the last 168 hours No results found for this basename: AMMONIA:5 in the last 168 hours CBC:  Lab 10/04/11 0625 10/03/11 0505 10/02/11 0538 10/01/11 0500 09/30/11 0944  WBC 14.7* 13.1* 15.4* 18.4* 21.9*  NEUTROABS -- -- -- -- --  HGB 11.1* 10.9* 10.8* 11.1* 11.9*  HCT 34.0* 33.6* 33.5* 34.6* 36.7*  MCV 88.1 88.0 87.9 88.0 88.4  PLT 265 241 215 188 179   Cardiac Enzymes:  Lab  09/29/11 0535  CKTOTAL 32  CKMB 2.2  CKMBINDEX --  TROPONINI <0.30   BNP (last 3 results)  Basename 08/12/11 1604 11/29/10 0500 11/28/10 1338  PROBNP 422.7 1050.0* 1356.0*   CBG: No results found for this basename: GLUCAP:5 in the last 168 hours  Recent Results (from the past 240 hour(s))  CLOSTRIDIUM DIFFICILE BY PCR     Status: Normal   Collection Time   09/24/11 11:56 AM      Component Value Range Status Comment   C difficile by pcr NEGATIVE  NEGATIVE Final   CULTURE, EXPECTORATED SPUTUM-ASSESSMENT     Status: Normal   Collection Time   09/29/11  1:15 PM      Component Value Range Status Comment   Specimen Description Expect. Sput   Final    Special Requests Normal   Final    Sputum evaluation     Final    Value: THIS SPECIMEN IS ACCEPTABLE. RESPIRATORY CULTURE REPORT TO FOLLOW.   Report Status 09/29/2011 FINAL   Final   CULTURE, RESPIRATORY     Status: Normal   Collection Time   09/29/11  1:15 PM        Component Value Range Status Comment   Specimen Description SPUTUM   Final    Special Requests NONE   Final    Gram Stain     Final    Value: FEW WBC PRESENT, PREDOMINANTLY PMN     RARE SQUAMOUS EPITHELIAL CELLS PRESENT     NO ORGANISMS SEEN   Culture FEW YEAST CONSISTENT WITH CANDIDA SPECIES   Final    Report Status 10/01/2011 FINAL   Final   CLOSTRIDIUM DIFFICILE BY PCR     Status: Normal   Collection Time   10/01/11 12:23 PM      Component Value Range Status Comment   C difficile by pcr NEGATIVE  NEGATIVE Final     Results for DEMECO, DUCKSWORTH (MRN [id number]) as of 10/04/2011 18:38  Ref. Range 10/04/2011 11:04  Monocyte-Macrophage-Serous Fluid Latest Range: 50-90 % 77  Other Cells, Fluid No range found MESOTHELIAL CELLS PRESENT  Glucose, Fluid No range found 120  Fluid Type-FGLU No range found PLEURAL  Fluid Type-FLDH No range found PLEURAL  LD, Fluid Latest Range: 3-23 U/L 144 (H)  Total protein, fluid No range found 2.0  Fluid Type-FCT No range found PLEURAL  Fluid Type-FTP No range found PLEURAL  Color, Fluid Latest Range: YELLOW  YELLOW  WBC, Fluid Latest Range: 0-1000 cu mm 250  Lymphs, Fluid No range found 11  Eos, Fluid No range found 1  Appearance, Fluid Latest Range: CLEAR  CLEAR  Neutrophil Count, Fluid Latest Range: 0-25 % 11   Studies: Dg Chest Port 1 View  09/24/2011  *RADIOLOGY REPORT*  Clinical Data: Right-sided central line placement.  PORTABLE CHEST - 1 VIEW  Comparison: 09/23/2011  Findings: Interval right subclavian approach central venous catheter placement.  The the catheter projects over the SVC however I cannot confirm tip position due to the AICD leads.  Again noted is a right upper lobe consolidation.  Aortic arch atherosclerosis. Heart size upper normal to mildly enlarged.  No interval osseous change.  No pneumothorax.  No definite pleural effusion.  IMPRESSION: Right subclavian approach catheter projects over the SVC, however I cannot confirm tip  position as it is obscured by the AICD leads.  Right upper lobe consolidation is most in keeping with pneumonia. Recommend radiographic follow-up after treatment to document resolution.   Original  Report Authenticated By: Waneta Martins, M.D.    Dg Abd Acute W/chest  09/23/2011  *RADIOLOGY REPORT*  Clinical Data: Weakness, diarrhea, history hypertension, CHF, coronary disease post MI and CABG, asthma, diabetes  ACUTE ABDOMEN SERIES (ABDOMEN 2 VIEW & CHEST 1 VIEW)  Comparison: 08/17/2011  Findings: Left subclavian transvenous AICD leads project over right atrium and right ventricle. Epicardial pacing wires and defibrillator leads present. Upper normal heart size post median sternotomy. Atherosclerotic calcification of a tortuous thoracic aorta. Right upper lobe infiltrate consistent with pneumonia. Remaining lungs clear. Bones demineralized.  Surgical clips right upper quadrant question cholecystectomy. Nonobstructive bowel gas pattern. No bowel dilatation, bowel wall thickening, or free intraperitoneal air. Calcifications project over the kidneys bilaterally question renal calculi. Bilateral pelvic phleboliths and atherosclerotic calcifications. Degenerative changes lumbar spine and bilateral hip joints.  IMPRESSION: Nonobstructive bowel gas pattern. Question bilateral renal calculi. Right upper lobe pneumonia. Due to the more focal parenchymal opacity at the right apex, follow- up chest radiographs until resolution recommended to exclude underlying abnormalities including tumor.   Original Report Authenticated By: Lollie Marrow, M.D.     Scheduled Meds:    . alum & mag hydroxide-simeth  15 mL Oral TID AC  . amiodarone  200 mg Oral Daily  . antiseptic oral rinse  15 mL Mouth Rinse BID  . atorvastatin  10 mg Oral q1800  . carvedilol  12.5 mg Oral BID WC  . cycloSPORINE  1 drop Both Eyes BID  . dexlansoprazole  60 mg Oral BID  . furosemide  40 mg Intravenous Daily  . HYDROcodone-acetaminophen  1  tablet Oral TID  . metronidazole  500 mg Intravenous Q8H  . omega-3 acid ethyl esters  1 g Oral BID  . piperacillin-tazobactam (ZOSYN)  IV  3.375 g Intravenous Q8H  . potassium chloride  40 mEq Oral Once  . psyllium  1 packet Oral Daily  . sodium chloride  3 mL Intravenous Q12H  . vancomycin  500 mg Intravenous Q12H  . DISCONTD: carvedilol  6.25 mg Oral BID WC  . DISCONTD: dexlansoprazole  60 mg Oral Q1200  . DISCONTD: fluconazole (DIFLUCAN) IV  100 mg Intravenous Q24H  . DISCONTD: furosemide  40 mg Intravenous BID  . DISCONTD: vancomycin  1,000 mg Intravenous Q12H   Continuous Infusions:    Principal Problem:  *SIRS (systemic inflammatory response syndrome) Active Problems:  VENTRICULAR TACHYCARDIA  Chronic atrial fibrillation  Chronic systolic heart failure- EF 35-40%  AUTOMATIC IMPLANTABLE CARDIAC DEFIBRILLATOR SITU  CAD (coronary artery disease)  Bradycardia  Anemia, iron deficiency  Ataxia  Dehydration  History of subdural hemorrhage  Hypotension  HCAP (healthcare-associated pneumonia)  Thrombocytopenia  Weakness generalized  Diarrhea  Hypophosphatemia  Chest wall discomfort  Acute on chronic systolic CHF (congestive heart failure)  Pleural effusion  Anasarca        Rolen Conger  [facility] Pager [phone]. If 8PM-8AM, please contact night-coverage at [email or web address], password [facility] 10/04/2011, 10:37 AM  LOS: 11 days

## 2011-10-05 DIAGNOSIS — R609 Edema, unspecified: Secondary | ICD-10-CM

## 2011-10-05 LAB — BASIC METABOLIC PANEL
Calcium: 8.3 mg/dL — ABNORMAL LOW (ref 8.4–10.5)
Creatinine, Ser: 1.22 mg/dL (ref 0.50–1.35)
GFR calc non Af Amer: 56 mL/min — ABNORMAL LOW (ref >90)
Glucose, Bld: 89 mg/dL (ref 70–99)
Sodium: 142 mEq/L (ref 135–145)

## 2011-10-05 LAB — CBC
Hemoglobin: 10.8 g/dL — ABNORMAL LOW (ref 13.0–17.0)
MCH: 28.7 pg (ref 26.0–34.0)
MCHC: 32.1 g/dL (ref 30.0–36.0)
MCV: 89.4 fL (ref 78.0–100.0)
Platelets: 304 10*3/uL (ref 150–400)

## 2011-10-05 LAB — GLUCOSE, CAPILLARY: Glucose-Capillary: 110 mg/dL — ABNORMAL HIGH (ref 70–99)

## 2011-10-05 NOTE — Progress Notes (Signed)
Physical Therapy Treatment Patient Details Name: [name] MRN: [id number] DOB: 08-09-1932 Today's Date: 10/05/2011 Time: 4098-1191 PT Time Calculation (min): 38 min  PT Assessment / Plan / Recommendation Comments on Treatment Session  Good progession of amb distance and activity tolerance; Still unsteady with unilateral device -- at this point, RW continues to be most appropriate device for amb;   Will monitor O2 sats with amb on Room Air next session; suspect the pt will desat, and that he needs home supplemental O2    Follow Up Recommendations  Home health PT;Supervision - Intermittent    Barriers to Discharge        Equipment Recommendations  Other (comment) (Home O2)    Recommendations for Other Services    Frequency Min 3X/week   Plan Discharge plan remains appropriate    Precautions / Restrictions Precautions Precautions: Fall   Pertinent Vitals/Pain Session conducted on 2 liters O2; pt desatted to 91% with amb on 2 liters    Mobility  Transfers Transfers: Sit to Stand;Stand to Sit Sit to Stand: 5: Supervision;From chair/3-in-1 Stand to Sit: 5: Supervision;To chair/3-in-1 Details for Transfer Assistance: Cues for safety, hand placement, technique Ambulation/Gait Ambulation/Gait Assistance: 4: Min guard Ambulation Distance (Feet): 220 Feet Assistive device: Rolling walker Ambulation/Gait Assistance Details: Cues fro posture, deepbreathing, and to self-monitor for activity tolerance Gait Pattern: Step-through pattern;Trunk flexed Gait velocity: decreased    Exercises     PT Diagnosis:    PT Problem List:   PT Treatment Interventions:     PT Goals Acute Rehab PT Goals Time For Goal Achievement: 10/08/11 Potential to Achieve Goals: Good Pt will go Sit to Stand: with modified independence;with upper extremity assist PT Goal: Sit to Stand - Progress: Progressing toward goal Pt will go Stand to Sit: with modified independence;with upper extremity  assist PT Goal: Stand to Sit - Progress: Progressing toward goal Pt will Ambulate: 51 - 150 feet;with modified independence;with least restrictive assistive device PT Goal: Ambulate - Progress: Progressing toward goal  Visit Information  Last PT Received On: 10/05/11 Assistance Needed: +1    Subjective Data  Subjective: Agreeable to amb Patient Stated Goal: go home   Cognition  Overall Cognitive Status: Appears within functional limits for tasks assessed/performed Arousal/Alertness: Awake/alert Orientation Level: Appears intact for tasks assessed Behavior During Session: [facility] for tasks performed    Balance     End of Session PT - End of Session Equipment Utilized During Treatment: Oxygen Activity Tolerance: Patient tolerated treatment well Patient left: Other (comment);with call bell/phone within reach (on 3in1 by chair) Nurse Communication: Mobility status   GP     Van Clines [facility] [place], [place] [phone]  10/05/2011, 4:56 PM

## 2011-10-05 NOTE — Progress Notes (Signed)
Patient ID: [name], male   DOB: 1932-07-29, 78 y.o.   MRN: [id number]  [facility] PROGRESS NOTE  [name] ZHY:[id number] DOB: 1932-07-26 DOA: 09/23/2011 PCP: Hoyle Sauer, MD  Brief narrative: Pt is 78 yo male admitted 8/23 with weakness, N/V, abd pain. Adm dx included RUL PNA, severe sepsis. Transferred to [facility] 8/26. Has subsequently developed large R effusion. Underwent thoracentesis 9/3  Principal Problem:  *SIRS (systemic inflammatory response syndrome) - now resolved and pt is off all antibiotics - clinically stable and at baseline  Active Problems:  VENTRICULAR TACHYCARDIA - HR controlled and at target range   Chronic atrial fibrillation - stable and with HR controlled   Chronic systolic heart failure- EF 35-40% - clinically compensated - continue Lasix   AUTOMATIC IMPLANTABLE CARDIAC DEFIBRILLATOR SITU - stable   Anemia, iron deficiency - Hg and Hct are stable and at pt's baseline   HCAP (healthcare-associated pneumonia) - completed course of antibiotics as noted below  - now off ABX and clinically stable   Diarrhea - now improved - continue supportive care    Hypophosphatemia - check in AM and supplement as indicated    Pleural effusion - status post thoracentesis 09/03 and doing well - repeat CXR recommended in 2-3 weeks  Consultants:  PCCM  Procedures/Studies: Dg Chest 1 View 10/04/2011  IMPRESSION:  No pneumothorax.      US Thoracentesis Asp Pleural Space W/img Guide 10/04/2011    IMPRESSION: Successful ultrasound guided diagnostic  right thoracentesis yielding 400 cc's of pleural fluid.     Right subclavian central line 09/23/2011 >>> plan discontinue 09/26/2011   Antibiotics:  1) Zosyn (09/23/11) -8/28 then 8/29-9/3  2) Vancomycin (09/23/11) -8/28 then 8/29-9/3  3) Augmentin 8/28-8/29  4) Zithromax 8/28-8/29  5) Diflucan 8/30-9/3  6) Flagyl 8/31 - 9/3  Code Status: Full Family Communication: Pt at  bedside Disposition Plan: Home when medically stable  HPI/Subjective: No events overnight.   Objective: Filed Vitals:   10/05/11 0500 10/05/11 0653 10/05/11 0856 10/05/11 1318  BP: 151/135 104/67 106/55 100/60  Pulse: 95 100 116 64  Temp: 98.4 F (36.9 C)  97.8 F (36.6 C) 98.2 F (36.8 C)  TempSrc: Oral     Resp:   17 18  Height:      Weight:      SpO2: 96%  93% 90%    Intake/Output Summary (Last 24 hours) at 10/05/11 1654 Last data filed at 10/05/11 1300  Gross per 24 hour  Intake    205 ml  Output    650 ml  Net   -445 ml    Exam:   General:  Pt is alert, follows commands appropriately, not in acute distress  Cardiovascular: Regular rate and rhythm, S1/S2, no murmurs, no rubs, no gallops  Respiratory: Clear to auscultation bilaterally, no wheezing, no crackles, no rhonchi  Abdomen: Soft, non tender, non distended, bowel sounds present, no guarding  Extremities: No edema, pulses DP and PT palpable bilaterally  Neuro: Grossly nonfocal  Data Reviewed: Basic Metabolic Panel:  Lab 10/05/11 2952 10/04/11 0625 10/03/11 0505 10/02/11 0538 10/01/11 0500  NA 142 142 140 140 140  K 3.5 3.5 3.6 3.3* 3.6  CL 100 100 100 101 103  CO2 34* 35* 32 32 28  GLUCOSE 89 106* 86 106* 103*  BUN 11 11 11 12 12   CREATININE 1.22 1.38* 1.16 1.06 0.90  CALCIUM 8.3* 8.4 8.1* 7.8* 7.9*  MG -- -- -- -- --  PHOS -- -- -- -- --  CBC:  Lab 10/05/11 0530 10/04/11 0625 10/03/11 0505 10/02/11 0538 10/01/11 0500  WBC 11.5* 14.7* 13.1* 15.4* 18.4*  NEUTROABS -- -- -- -- --  HGB 10.8* 11.1* 10.9* 10.8* 11.1*  HCT 33.6* 34.0* 33.6* 33.5* 34.6*  MCV 89.4 88.1 88.0 87.9 88.0  PLT 304 265 241 215 188   Cardiac Enzymes:  Lab 09/29/11 0535  CKTOTAL 32  CKMB 2.2  CKMBINDEX --  TROPONINI <0.30     Recent Results (from the past 240 hour(s))  CULTURE, EXPECTORATED SPUTUM-ASSESSMENT     Status: Normal   Collection Time   09/29/11  1:15 PM      Component Value Range Status Comment    Specimen Description Expect. Sput   Final    Special Requests Normal   Final    Sputum evaluation     Final    Value: THIS SPECIMEN IS ACCEPTABLE. RESPIRATORY CULTURE REPORT TO FOLLOW.   Report Status 09/29/2011 FINAL   Final   CULTURE, RESPIRATORY     Status: Normal   Collection Time   09/29/11  1:15 PM      Component Value Range Status Comment   Specimen Description SPUTUM   Final    Special Requests NONE   Final    Gram Stain     Final    Value: FEW WBC PRESENT, PREDOMINANTLY PMN     RARE SQUAMOUS EPITHELIAL CELLS PRESENT     NO ORGANISMS SEEN   Culture FEW YEAST CONSISTENT WITH CANDIDA SPECIES   Final    Report Status 10/01/2011 FINAL   Final   CLOSTRIDIUM DIFFICILE BY PCR     Status: Normal   Collection Time   10/01/11 12:23 PM      Component Value Range Status Comment   C difficile by pcr NEGATIVE  NEGATIVE Final      Scheduled Meds:   . alum & mag hydroxide-simeth  15 mL Oral TID AC  . amiodarone  200 mg Oral Daily  . antiseptic oral rinse  15 mL Mouth Rinse BID  . atorvastatin  10 mg Oral q1800  . carvedilol  12.5 mg Oral BID WC  . cycloSPORINE  1 drop Both Eyes BID  . dexlansoprazole  60 mg Oral BID  . furosemide  40 mg Intravenous Daily  . HYDROcodone-acetaminophen  1 tablet Oral TID  . omega-3 acid ethyl esters  1 g Oral BID  . psyllium  1 packet Oral Daily  . sodium chloride  3 mL Intravenous Q12H   Continuous Infusions:    Debbora Presto, MD  [facility] Pager [phone]  If 7PM-7AM, please contact night-coverage [email or web address] Password [email or web address] 10/05/2011, 4:54 PM   LOS: 12 days

## 2011-10-06 DIAGNOSIS — K5520 Angiodysplasia of colon without hemorrhage: Secondary | ICD-10-CM

## 2011-10-06 LAB — CBC
MCHC: 31.9 g/dL (ref 30.0–36.0)
MCV: 89.6 fL (ref 78.0–100.0)
Platelets: 293 10*3/uL (ref 150–400)
RDW: 17.5 % — ABNORMAL HIGH (ref 11.5–15.5)
WBC: 12.2 10*3/uL — ABNORMAL HIGH (ref 4.0–10.5)

## 2011-10-06 LAB — BASIC METABOLIC PANEL
BUN: 13 mg/dL (ref 6–23)
Calcium: 8.5 mg/dL (ref 8.4–10.5)
Chloride: 100 mEq/L (ref 96–112)
Creatinine, Ser: 1.22 mg/dL (ref 0.50–1.35)
GFR calc Af Amer: 65 mL/min — ABNORMAL LOW (ref >90)
GFR calc non Af Amer: 56 mL/min — ABNORMAL LOW (ref >90)

## 2011-10-06 MED ORDER — FUROSEMIDE 10 MG/ML IJ SOLN
60.0000 mg | Freq: Two times a day (BID) | INTRAMUSCULAR | Status: DC
  Filled 2011-10-06 (×2): qty 6

## 2011-10-06 MED ORDER — FUROSEMIDE 80 MG PO TABS
80.0000 mg | ORAL_TABLET | Freq: Two times a day (BID) | ORAL | Status: DC
  Administered 2011-10-06 – 2011-10-07 (×2): 80 mg via ORAL
  Filled 2011-10-06 (×4): qty 1

## 2011-10-06 NOTE — Progress Notes (Signed)
Patient ID: [name], male   DOB: 1932-10-30, 78 y.o.   MRN: [id number]  [facility] PROGRESS NOTE  ROBERTSON COLCLOUGH HYQ:[id number] DOB: October 28, 1932 DOA: 09/23/2011 PCP: Hoyle Sauer, MD  Brief narrative:  Pt is 78 yo male admitted 8/23 with weakness, N/V, abd pain. Adm dx included RUL PNA, severe sepsis. Transferred to [facility] 8/26. Has subsequently developed large R effusion. Underwent thoracentesis 9/3   Principal Problem:  *SIRS (systemic inflammatory response syndrome)  - now resolved and pt is off all antibiotics  - clinically stable and at baseline   Active Problems:  VENTRICULAR TACHYCARDIA  - HR controlled and at target range   Chronic atrial fibrillation  - stable and with HR controlled on physical exam but was slightly tachycardic in the AM  Chronic systolic heart failure- EF 35-40%  - more crackles on physical exam and worsening lower extremity edema - will increase the dose of Lasix and will monitor strict I's and O's - monitor daily weights - call cardiology as per pt request   AUTOMATIC IMPLANTABLE CARDIAC DEFIBRILLATOR SITU  - stable   Anemia, iron deficiency  - Hg and Hct are stable and at pt's baseline   HCAP (healthcare-associated pneumonia)  - completed course of antibiotics as noted below  - now off ABX and clinically stable   Diarrhea  - now improved  - continue supportive care   Hypophosphatemia  - check in AM and supplement as indicated   Pleural effusion  - status post thoracentesis 09/03 and doing well  - repeat CXR recommended in 2-3 weeks   Consultants:  PCCM  Procedures/Studies:  Dg Chest 1 View 10/04/2011  IMPRESSION:  No pneumothorax.   US Thoracentesis Asp Pleural Space W/img Guide 10/04/2011  IMPRESSION:  Successful ultrasound guided diagnostic right thoracentesis yielding 400 cc's of pleural fluid.    Right subclavian central line 09/23/2011 >>> discontinue 09/26/2011   Antibiotics:  1) Zosyn (09/23/11) -8/28  then 8/29-9/3  2) Vancomycin (09/23/11) -8/28 then 8/29-9/3  3) Augmentin 8/28-8/29  4) Zithromax 8/28-8/29  5) Diflucan 8/30-9/3  6) Flagyl 8/31 - 9/3   Code Status: Full  Family Communication: Pt at bedside  Disposition Plan: Home when medically stable   HPI/Subjective: No events overnight.   Objective: Filed Vitals:   10/06/11 0900 10/06/11 0934 10/06/11 1318 10/06/11 1618  BP:  102/58 92/48 96/55   Pulse:  108 99 96  Temp:  97.8 F (36.6 C) 98 F (36.7 C) 97.4 F (36.3 C)  TempSrc:      Resp:  19 19 17   Height:      Weight:      SpO2: 93% 98% 93% 100%    Intake/Output Summary (Last 24 hours) at 10/06/11 1838 Last data filed at 10/06/11 1828  Gross per 24 hour  Intake    800 ml  Output    575 ml  Net    225 ml    Exam:   General:  Pt is alert, follows commands appropriately, not in acute distress  Cardiovascular: Regular rhythm, tachycardic, S1/S2, SEM 2/6, no rubs, no gallops  Respiratory: Bibasilar crackles but no wheezing or rhonchi  Abdomen: Soft, non tender, non distended, bowel sounds present, no guarding  Extremities: + 2 bilateral pitting edema  Neuro: Grossly nonfocal  Data Reviewed: Basic Metabolic Panel:  Lab 10/06/11 9528 10/05/11 0530 10/04/11 0625 10/03/11 0505 10/02/11 0538  NA 142 142 142 140 140  K 3.5 3.5 3.5 3.6 3.3*  CL 100 100 100 100  101  CO2 35* 34* 35* 32 32  GLUCOSE 91 89 106* 86 106*  BUN 13 11 11 11 12   CREATININE 1.22 1.22 1.38* 1.16 1.06  CALCIUM 8.5 8.3* 8.4 8.1* 7.8*  MG -- -- -- -- --  PHOS -- -- -- -- --   CBC:  Lab 10/06/11 0500 10/05/11 0530 10/04/11 0625 10/03/11 0505 10/02/11 0538  WBC 12.2* 11.5* 14.7* 13.1* 15.4*  NEUTROABS -- -- -- -- --  HGB 10.5* 10.8* 11.1* 10.9* 10.8*  HCT 32.9* 33.6* 34.0* 33.6* 33.5*  MCV 89.6 89.4 88.1 88.0 87.9  PLT 293 304 265 241 215   CBG:  Lab 10/05/11 2213  GLUCAP 110*    Recent Results (from the past 240 hour(s))  CULTURE, EXPECTORATED SPUTUM-ASSESSMENT      Status: Normal   Collection Time   09/29/11  1:15 PM      Component Value Range Status Comment   Specimen Description Expect. Sput   Final    Special Requests Normal   Final    Sputum evaluation     Final    Value: THIS SPECIMEN IS ACCEPTABLE. RESPIRATORY CULTURE REPORT TO FOLLOW.   Report Status 09/29/2011 FINAL   Final   CULTURE, RESPIRATORY     Status: Normal   Collection Time   09/29/11  1:15 PM      Component Value Range Status Comment   Specimen Description SPUTUM   Final    Special Requests NONE   Final    Gram Stain     Final    Value: FEW WBC PRESENT, PREDOMINANTLY PMN     RARE SQUAMOUS EPITHELIAL CELLS PRESENT     NO ORGANISMS SEEN   Culture FEW YEAST CONSISTENT WITH CANDIDA SPECIES   Final    Report Status 10/01/2011 FINAL   Final   CLOSTRIDIUM DIFFICILE BY PCR     Status: Normal   Collection Time   10/01/11 12:23 PM      Component Value Range Status Comment   C difficile by pcr NEGATIVE  NEGATIVE Final      Scheduled Meds:   . alum & mag   15 mL Oral TID AC  . amiodarone  200 mg Oral Daily  . atorvastatin  10 mg Oral q1800  . carvedilol  12.5 mg Oral BID WC  . cycloSPORINE  1 drop Both Eyes BID  . dexlansoprazole  60 mg Oral BID  . furosemide  80 mg Oral BID  . HYDROcodone  1 tablet Oral TID  . omega-3 acid ethyl   1 g Oral BID  . psyllium  1 packet Oral Daily   Continuous Infusions:    Debbora Presto, MD  [facility] Pager [phone]  If 7PM-7AM, please contact night-coverage [email or web address] Password [email or web address] 10/06/2011, 6:38 PM   LOS: 13 days

## 2011-10-06 NOTE — Progress Notes (Signed)
Physical Therapy Treatment Patient Details Name: [name] MRN: [id number] DOB: September 25, 1932 Today's Date: 10/06/2011 Time: 8295-6213 PT Time Calculation (min): 29 min  PT Assessment / Plan / Recommendation Comments on Treatment Session  Tolerating gait well with brief rests.  Did drop sats on room air with ambulation to 83%. See vitals tab for findings.  Will need home O2 if MD agreeable.    Follow Up Recommendations  Home health PT;Supervision - Intermittent    Barriers to Discharge        Equipment Recommendations  Other (comment) (home O2)    Recommendations for Other Services    Frequency Min 3X/week   Plan Discharge plan remains appropriate    Precautions / Restrictions Restrictions Weight Bearing Restrictions: No   Pertinent Vitals/Pain sats on 2.5 L O2 at rest 92%, decreased to 83% on room air.  O2 reapplied at 3L O2 for remainder of walk (no 2.5 setting) with sats up to 87%.  Pt. Returned to 93% with wall O2 reapplied at 2.5 L.  No pain.    Mobility  Bed Mobility Bed Mobility: Supine to Sit Supine to Sit: 6: Modified independent (Device/Increase time);With rails;HOB elevated Details for Bed Mobility Assistance: no physical assist needed toa=day, cues for safety Transfers Transfers: Sit to Stand;Stand to Sit Sit to Stand: 5: Supervision;From bed;With upper extremity assist Stand to Sit: 5: Supervision;To chair/3-in-1;With upper extremity assist;With armrests Details for Transfer Assistance: Cues for safety, hand placement, technique Ambulation/Gait Ambulation/Gait Assistance: 5: Supervision Ambulation Distance (Feet): 200 Feet Assistive device: Rolling walker Ambulation/Gait Assistance Details: postural cues, pursed lip breathing and self monitor for activity tolerance Gait Pattern: Step-through pattern;Trunk flexed Gait velocity: decreased Stairs: No    Exercises     PT Diagnosis:    PT Problem List:   PT Treatment Interventions:     PT Goals Acute  Rehab PT Goals PT Goal: Supine/Side to Sit - Progress: Progressing toward goal PT Goal: Sit to Stand - Progress: Progressing toward goal PT Goal: Stand to Sit - Progress: Progressing toward goal PT Goal: Ambulate - Progress: Progressing toward goal  Visit Information  Last PT Received On: 10/06/11 Assistance Needed: +1    Subjective Data  Subjective: Let me sit on that pillow so my bottom wont be sore   Cognition  Overall Cognitive Status: Appears within functional limits for tasks assessed/performed Arousal/Alertness: Awake/alert Orientation Level: Appears intact for tasks assessed Behavior During Session: [facility] for tasks performed    Balance     End of Session PT - End of Session Equipment Utilized During Treatment: Oxygen Activity Tolerance: Patient tolerated treatment well;Patient limited by fatigue Patient left: in chair;with call bell/phone within reach Nurse Communication: Mobility status;Other (comment) (need for O2 due to sats dropping on RA with walking)   GP     Ferman Hamming 10/06/2011, 9:16 AM Weldon Picking PT Acute Rehab Services [phone] Beeper [phone]

## 2011-10-07 DIAGNOSIS — I5023 Acute on chronic systolic (congestive) heart failure: Secondary | ICD-10-CM

## 2011-10-07 DIAGNOSIS — I4891 Unspecified atrial fibrillation: Secondary | ICD-10-CM

## 2011-10-07 DIAGNOSIS — I509 Heart failure, unspecified: Secondary | ICD-10-CM

## 2011-10-07 LAB — CBC
HCT: 35.5 % — ABNORMAL LOW (ref 39.0–52.0)
MCH: 28.8 pg (ref 26.0–34.0)
MCHC: 32.1 g/dL (ref 30.0–36.0)
MCV: 89.6 fL (ref 78.0–100.0)
Platelets: 340 10*3/uL (ref 150–400)
RDW: 17.3 % — ABNORMAL HIGH (ref 11.5–15.5)
WBC: 12.9 10*3/uL — ABNORMAL HIGH (ref 4.0–10.5)

## 2011-10-07 LAB — BASIC METABOLIC PANEL
BUN: 12 mg/dL (ref 6–23)
Calcium: 8.7 mg/dL (ref 8.4–10.5)
Creatinine, Ser: 1.19 mg/dL (ref 0.50–1.35)
GFR calc Af Amer: 67 mL/min — ABNORMAL LOW (ref >90)

## 2011-10-07 MED ORDER — FUROSEMIDE 10 MG/ML IJ SOLN
60.0000 mg | Freq: Two times a day (BID) | INTRAMUSCULAR | Status: DC
  Administered 2011-10-08 – 2011-10-09 (×4): 60 mg via INTRAVENOUS
  Filled 2011-10-07 (×7): qty 6

## 2011-10-07 MED ORDER — FUROSEMIDE 10 MG/ML IJ SOLN
40.0000 mg | Freq: Two times a day (BID) | INTRAMUSCULAR | Status: DC
  Administered 2011-10-07: 40 mg via INTRAVENOUS
  Filled 2011-10-07 (×2): qty 4

## 2011-10-07 NOTE — Progress Notes (Signed)
Physical Therapy Treatment Patient Details Name: [name] MRN: [id number] DOB: Feb 25, 1932 Today's Date: 10/07/2011 Time: 1130-1202 PT Time Calculation (min): 32 min  PT Assessment / Plan / Recommendation Comments on Treatment Session  O2 during ambulation set at 2L .  Pt. dropped to 88% at one point in walk, but remainder of checks were at 90 or greater on 2L.  Progressing distance and activity tolerance.      Follow Up Recommendations  Home health PT;Supervision - Intermittent    Barriers to Discharge        Equipment Recommendations  Other (comment) (home O2)    Recommendations for Other Services    Frequency Min 3X/week   Plan Discharge plan remains appropriate    Precautions / Restrictions Precautions Precautions: Fall Restrictions Weight Bearing Restrictions: No   Pertinent Vitals/Pain No pain, see comments for O2 sat info    Mobility  Bed Mobility Bed Mobility: Supine to Sit Supine to Sit: 6: Modified independent (Device/Increase time);With rails;HOB elevated Sit to Supine: Not Tested (comment) Details for Bed Mobility Assistance: no physical assist, safety cues needed Transfers Transfers: Sit to Stand;Stand to Sit Sit to Stand: 5: Supervision;From bed;With upper extremity assist Stand to Sit: 5: Supervision;To bed;With upper extremity assist Details for Transfer Assistance: Cues for safety, hand placement, technique Ambulation/Gait Ambulation/Gait Assistance: 5: Supervision Ambulation Distance (Feet): 250 Feet Assistive device: Rolling walker Ambulation/Gait Assistance Details: Pt. required several standing rest breaks during his walk.  Slight unsteadiness with Rw noted.   Gait Pattern: Step-through pattern;Trunk flexed Gait velocity: decreased Stairs: No    Exercises General Exercises - Lower Extremity Ankle Circles/Pumps: AROM;20 reps;Seated Long Arc Quad: AROM;Both;20 reps;Seated Hip Flexion/Marching: AROM;Both;20 reps;Seated   PT Diagnosis:      PT Problem List:   PT Treatment Interventions:     PT Goals Acute Rehab PT Goals PT Goal: Supine/Side to Sit - Progress: Progressing toward goal PT Goal: Sit to Stand - Progress: Progressing toward goal PT Goal: Stand to Sit - Progress: Progressing toward goal PT Goal: Ambulate - Progress: Progressing toward goal  Visit Information  Last PT Received On: 10/07/11 Assistance Needed: +1    Subjective Data  Subjective: "I'm feeling pretty good today"   Cognition  Overall Cognitive Status: Appears within functional limits for tasks assessed/performed Arousal/Alertness: Awake/alert Orientation Level: Appears intact for tasks assessed Behavior During Session: [facility] for tasks performed    Balance     End of Session PT - End of Session Equipment Utilized During Treatment: Gait belt;Oxygen Activity Tolerance: Patient tolerated treatment well;Patient limited by fatigue Patient left: in bed;with call bell/phone within reach Nurse Communication: Mobility status;Other (comment)   GP     Ferman Hamming 10/07/2011, 1:48 PM Weldon Picking PT Acute Rehab Services [phone] Beeper [phone]
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
## 2011-10-07 NOTE — Progress Notes (Signed)
Patient ID: [name], male   DOB: 11-23-1932, 78 y.o.   MRN: [id number]  [facility] PROGRESS NOTE  [name]:[id number] DOB: 08-24-1932 DOA: 09/23/2011 PCP: Hoyle Sauer, MD  Brief narrative:  Pt is 78 yo male admitted 8/23 with weakness, N/V, abd pain. Adm dx included RUL PNA, severe sepsis. Transferred to [facility] 8/26. Has subsequently developed large R effusion. Underwent thoracentesis 9/3   Principal Problem:  *SIRS (systemic inflammatory response syndrome)  - now resolved and pt is off all antibiotics  - clinically stable and at baseline   Active Problems:  VENTRICULAR TACHYCARDIA  - HR controlled and at target range   Chronic atrial fibrillation  - stable and with HR controlled on physical exam  Chronic systolic heart failure- EF 35-40%  - more crackles on physical exam and worsening lower extremity edema  - will increase the dose of Lasix and will monitor strict I's and O's  - monitor daily weights  - call cardiology as per pt request   AUTOMATIC IMPLANTABLE CARDIAC DEFIBRILLATOR SITU  - stable   Anemia, iron deficiency  - Hg and Hct are stable and at pt's baseline   HCAP (healthcare-associated pneumonia)  - completed course of antibiotics as noted below  - now off ABX and clinically stable   Diarrhea  - now improved  - continue supportive care   Hypophosphatemia  - check in AM and supplement as indicated   Pleural effusion  - status post thoracentesis 09/03 and doing well  - repeat CXR recommended in 2-3 weeks   Consultants:  PCCM  Procedures/Studies:  Dg Chest 1 View 10/04/2011  IMPRESSION:  No pneumothorax.   US Thoracentesis Asp Pleural Space W/img Guide 10/04/2011  IMPRESSION:  Successful ultrasound guided diagnostic right thoracentesis yielding 400 cc's of pleural fluid.   Right subclavian central line 09/23/2011 >>> discontinue 09/26/2011   Antibiotics:  1) Zosyn (09/23/11) -8/28 then 8/29-9/3  2) Vancomycin  (09/23/11) -8/28 then 8/29-9/3  3) Augmentin 8/28-8/29  4) Zithromax 8/28-8/29  5) Diflucan 8/30-9/3  6) Flagyl 8/31 - 9/3   Code Status: Full  Family Communication: Pt at bedside  Disposition Plan: Home when medically stable   HPI/Subjective: No events overnight.   Objective: Filed Vitals:   10/07/11 1449 10/07/11 1626 10/07/11 1854 10/07/11 2050  BP: 91/49 97/66 107/58 102/64  Pulse: 83  58 99  Temp: 97.6 F (36.4 C)  97.7 F (36.5 C) 97.8 F (36.6 C)  TempSrc: Oral  Oral Oral  Resp: 17  18 18   Height:    6' (1.829 m)  Weight:    85.322 kg (188 lb 1.6 oz)  SpO2: 97%  96% 97%    Intake/Output Summary (Last 24 hours) at 10/07/11 2210 Last data filed at 10/07/11 1454  Gross per 24 hour  Intake    100 ml  Output   1300 ml  Net  -1200 ml    Exam:   General:  Pt is alert, follows commands appropriately, not in acute distress  Cardiovascular: Regular rate and rhythm, S1/S2, no murmurs, no rubs, no gallops  Respiratory: Bilateral crackles and decreased breat sounds at bases, no wheezing  Abdomen: Soft, non tender, non distended, bowel sounds present, no guarding  Extremities: +2 bilateral lower extremity pitting edema, pulses DP and PT palpable bilaterally  Neuro: Grossly nonfocal  Data Reviewed: Basic Metabolic Panel:  Lab 10/07/11 9562 10/06/11 0500 10/05/11 0530 10/04/11 0625 10/03/11 0505  NA 138 142 142 142 140  K 5.1  3.5 3.5 3.5 3.6  CL 96 100 100 100 100  CO2 34* 35* 34* 35* 32  GLUCOSE 103* 91 89 106* 86  BUN 12 13 11 11 11   CREATININE 1.19 1.22 1.22 1.38* 1.16  CALCIUM 8.7 8.5 8.3* 8.4 8.1*  MG -- -- -- -- --  PHOS -- -- -- -- --   CBC:  Lab 10/07/11 0530 10/06/11 0500 10/05/11 0530 10/04/11 0625 10/03/11 0505  WBC 12.9* 12.2* 11.5* 14.7* 13.1*  NEUTROABS -- -- -- -- --  HGB 11.4* 10.5* 10.8* 11.1* 10.9*  HCT 35.5* 32.9* 33.6* 34.0* 33.6*  MCV 89.6 89.6 89.4 88.1 88.0  PLT 340 293 304 265 241   CBG:  Lab 10/05/11 2213  GLUCAP 110*     Recent Results (from the past 240 hour(s))  CULTURE, EXPECTORATED SPUTUM-ASSESSMENT     Status: Normal   Collection Time   09/29/11  1:15 PM      Component Value Range Status Comment   Specimen Description Expect. Sput   Final    Special Requests Normal   Final    Sputum evaluation     Final    Value: THIS SPECIMEN IS ACCEPTABLE. RESPIRATORY CULTURE REPORT TO FOLLOW.   Report Status 09/29/2011 FINAL   Final   CULTURE, RESPIRATORY     Status: Normal   Collection Time   09/29/11  1:15 PM      Component Value Range Status Comment   Specimen Description SPUTUM   Final    Special Requests NONE   Final    Gram Stain     Final    Value: FEW WBC PRESENT, PREDOMINANTLY PMN     RARE SQUAMOUS EPITHELIAL CELLS PRESENT     NO ORGANISMS SEEN   Culture FEW YEAST CONSISTENT WITH CANDIDA SPECIES   Final    Report Status 10/01/2011 FINAL   Final   CLOSTRIDIUM DIFFICILE BY PCR     Status: Normal   Collection Time   10/01/11 12:23 PM      Component Value Range Status Comment   C difficile by pcr NEGATIVE  NEGATIVE Final      Scheduled Meds:   . alum & mag hydroxide-simeth  15 mL Oral TID AC  . amiodarone  200 mg Oral Daily  . antiseptic oral rinse  15 mL Mouth Rinse BID  . atorvastatin  10 mg Oral q1800  . carvedilol  12.5 mg Oral BID WC  . cycloSPORINE  1 drop Both Eyes BID  . dexlansoprazole  60 mg Oral BID  . furosemide  60 mg Intravenous BID  . HYDROcodone-acetaminophen  1 tablet Oral TID  . omega-3 acid ethyl esters  1 g Oral BID  . psyllium  1 packet Oral Daily  . sodium chloride  3 mL Intravenous Q12H  . DISCONTD: furosemide  40 mg Intravenous BID  . DISCONTD: furosemide  80 mg Oral BID   Continuous Infusions:    Debbora Presto, MD  [facility] Pager [phone]  If 7PM-7AM, please contact night-coverage [email or web address] Password [email or web address] 10/07/2011, 10:10 PM   LOS: 14 days

## 2011-10-08 LAB — CBC
HCT: 36 % — ABNORMAL LOW (ref 39.0–52.0)
MCHC: 31.7 g/dL (ref 30.0–36.0)
Platelets: 309 10*3/uL (ref 150–400)
RDW: 17.2 % — ABNORMAL HIGH (ref 11.5–15.5)
WBC: 10.3 10*3/uL (ref 4.0–10.5)

## 2011-10-08 LAB — BASIC METABOLIC PANEL
BUN: 10 mg/dL (ref 6–23)
CO2: 32 mEq/L (ref 19–32)
Calcium: 8.5 mg/dL (ref 8.4–10.5)
Chloride: 97 mEq/L (ref 96–112)
Chloride: 97 mEq/L (ref 96–112)
Creatinine, Ser: 1.23 mg/dL (ref 0.50–1.35)
Creatinine, Ser: 1.3 mg/dL (ref 0.50–1.35)
GFR calc Af Amer: 64 mL/min — ABNORMAL LOW (ref >90)
GFR calc Af Amer: 60 mL/min — ABNORMAL LOW (ref >90)
GFR calc non Af Amer: 55 mL/min — ABNORMAL LOW (ref >90)
Potassium: 3.7 mEq/L (ref 3.5–5.1)
Sodium: 136 mEq/L (ref 135–145)

## 2011-10-08 NOTE — Progress Notes (Signed)
Patient ID: [name], male   DOB: 09-Sep-1932, 78 y.o.   MRN: [id number] Subjective:  Dypnea improved. No chest pain. Peripheral edema still present  Objective:  Vital Signs in the last 24 hours: Temp:  [97.6 F (36.4 C)-97.9 F (36.6 C)] 97.7 F (36.5 C) (09/07 0444) Pulse Rate:  [58-103] 103  (09/07 0444) Resp:  [17-18] 18  (09/07 0444) BP: (91-121)/(49-71) 121/71 mmHg (09/07 0444) SpO2:  [96 %-97 %] 96 % (09/07 0444) Weight:  [188 lb 1.6 oz (85.322 kg)] 188 lb 1.6 oz (85.322 kg) (09/06 2050)  Intake/Output from previous day: 09/06 0701 - 09/07 0700 In: 100 [P.O.:100] Out: 950 [Urine:950] Intake/Output from this shift:    Physical Exam: Well appearing elderly man, NAD HEENT: Unremarkable Neck:  7 cm JVD, no thyromegally Lungs:  Decreased breath sounds on the left with egophony, rales on right HEART:  IRegular rate rhythm, no murmurs, no rubs, no clicks Abd:  soft, positive bowel sounds, no organomegally, no rebound, no guarding Ext:  2 plus pulses, 3+ edema bilaterally, no cyanosis, no clubbing Skin:  No rashes no nodules Neuro:  CN II through XII intact, motor grossly intact  Lab Results:  Basename 10/07/11 0530 10/06/11 0500  WBC 12.9* 12.2*  HGB 11.4* 10.5*  PLT 340 293    Basename 10/07/11 0530 10/06/11 0500  NA 138 142  K 5.1 3.5  CL 96 100  CO2 34* 35*  GLUCOSE 103* 91  BUN 12 13  CREATININE 1.19 1.22   No results found for this basename: TROPONINI:2,CK,MB:2 in the last 72 hours Hepatic Function Panel No results found for this basename: PROT,ALBUMIN,AST,ALT,ALKPHOS,BILITOT,BILIDIR,IBILI in the last 72 hours No results found for this basename: CHOL in the last 72 hours No results found for this basename: PROTIME in the last 72 hours  Imaging: No results found.  Cardiac Studies: Tele - atrial fib with a CVR Assessment/Plan:  1. Acute on chronic systolic CHF 2. Atrial fib with a CVR 3. H/o subdural hematoma, not a candidate for coumadin 4.  Acid reflux Rec: continue iv diuresis, weight not changed much. Follow renal function. Keep legs elevated and place support stockings.   LOS: 15 days    Buel Ream.D. 10/08/2011, 7:57 AM

## 2011-10-08 NOTE — Progress Notes (Signed)
Patient ID: [name], male   DOB: Dec 27, 1932, 78 y.o.   MRN: [id number]  [facility] PROGRESS NOTE  ENDER RORKE NWG:[id number] DOB: Jun 06, 1932 DOA: 09/23/2011 PCP: Hoyle Sauer, MD  Brief narrative:  Pt is 78 yo male admitted 8/23 with weakness, N/V, abd pain. Adm dx included RUL PNA, severe sepsis. Transferred to [facility] 8/26. Has subsequently developed large R effusion. Underwent thoracentesis 9/3   Principal Problem:  *SIRS (systemic inflammatory response syndrome)  - now resolved and pt is off all antibiotics  - clinically stable and at baseline   Active Problems:  VENTRICULAR TACHYCARDIA  - HR controlled and at target range   Chronic atrial fibrillation  - stable and with HR controlled on physical exam   Chronic systolic heart failure- EF 35-40%  - less crackles on physical exam and unchanged lower extremity edema  - will continue Lasix and will monitor strict I's and O's  - monitor daily weights  - follow up on cardiology recommendations  AUTOMATIC IMPLANTABLE CARDIAC DEFIBRILLATOR SITU  - stable   Anemia, iron deficiency  - Hg and Hct are stable and at pt's baseline   HCAP (healthcare-associated pneumonia)  - completed course of antibiotics as noted below  - now off ABX and clinically stable   Diarrhea  - now improved  - continue supportive care   Hypophosphatemia  - check in AM and supplement as indicated   Pleural effusion  - status post thoracentesis 09/03 and doing well  - repeat CXR recommended in 2-3 weeks   Consultants:  PCCM Cardiology  Procedures/Studies:  Dg Chest 1 View 10/04/2011  IMPRESSION:  No pneumothorax.  US Thoracentesis Asp Pleural Space W/img Guide 10/04/2011  IMPRESSION:  Successful ultrasound guided diagnostic right thoracentesis yielding 400 cc's of pleural fluid.  Right subclavian central line 09/23/2011 >>> discontinue 09/26/2011   Antibiotics:  1) Zosyn (09/23/11) -8/28 then 8/29-9/3  2) Vancomycin  (09/23/11) -8/28 then 8/29-9/3  3) Augmentin 8/28-8/29  4) Zithromax 8/28-8/29  5) Diflucan 8/30-9/3  6) Flagyl 8/31 - 9/3   Code Status: Full  Family Communication: Pt at bedside  Disposition Plan: Home when medically stable   HPI/Subjective: No events overnight.   Objective: Filed Vitals:   10/07/11 2050 10/08/11 0444 10/08/11 0857 10/08/11 1317  BP: 102/64 121/71 132/78 99/61  Pulse: 99 103 103 84  Temp: 97.8 F (36.6 C) 97.7 F (36.5 C) 97.9 F (36.6 C) 98.6 F (37 C)  TempSrc: Oral Oral Oral Oral  Resp: 18 18 18 17   Height: 6' (1.829 m)     Weight: 85.322 kg (188 lb 1.6 oz)     SpO2: 97% 96% 94% 97%    Intake/Output Summary (Last 24 hours) at 10/08/11 1405 Last data filed at 10/08/11 1300  Gross per 24 hour  Intake    480 ml  Output    775 ml  Net   -295 ml    Exam:   General:  Pt is alert, follows commands appropriately, not in acute distress  Cardiovascular: Regular rate and rhythm, S1/S2, no murmurs, no rubs, no gallops  Respiratory: Clear to auscultation bilaterally with bibasilar crackles  Abdomen: Soft, non tender, non distended, bowel sounds present, no guarding  Extremities: +2 bilateral lower extremity edema, pulses DP and PT palpable bilaterally  Neuro: Grossly nonfocal  Data Reviewed: Basic Metabolic Panel:  Lab 10/08/11 5784 10/07/11 0530 10/06/11 0500 10/05/11 0530 10/04/11 0625  NA 138 138 142 142 142  K 3.7 5.1 3.5 3.5 3.5  CL 97 96 100 100 100  CO2 33* 34* 35* 34* 35*  GLUCOSE 144* 103* 91 89 106*  BUN 10 12 13 11 11   CREATININE 1.23 1.19 1.22 1.22 1.38*  CALCIUM 8.7 8.7 8.5 8.3* 8.4  MG -- -- -- -- --  PHOS -- -- -- -- --   Liver Function Tests: No results found for this basename: AST:5,ALT:5,ALKPHOS:5,BILITOT:5,PROT:5,ALBUMIN:5 in the last 168 hours No results found for this basename: LIPASE:5,AMYLASE:5 in the last 168 hours No results found for this basename: AMMONIA:5 in the last 168 hours CBC:  Lab 10/08/11 1009  10/07/11 0530 10/06/11 0500 10/05/11 0530 10/04/11 0625  WBC 10.3 12.9* 12.2* 11.5* 14.7*  NEUTROABS -- -- -- -- --  HGB 11.4* 11.4* 10.5* 10.8* 11.1*  HCT 36.0* 35.5* 32.9* 33.6* 34.0*  MCV 89.1 89.6 89.6 89.4 88.1  PLT 309 340 293 304 265   Cardiac Enzymes: No results found for this basename: CKTOTAL:5,CKMB:5,CKMBINDEX:5,TROPONINI:5 in the last 168 hours BNP: No components found with this basename: POCBNP:5 CBG:  Lab 10/05/11 2213  GLUCAP 110*    Recent Results (from the past 240 hour(s))  CULTURE, EXPECTORATED SPUTUM-ASSESSMENT     Status: Normal   Collection Time   09/29/11  1:15 PM      Component Value Range Status Comment   Specimen Description Expect. Sput   Final    Special Requests Normal   Final    Sputum evaluation     Final    Value: THIS SPECIMEN IS ACCEPTABLE. RESPIRATORY CULTURE REPORT TO FOLLOW.   Report Status 09/29/2011 FINAL   Final   CULTURE, RESPIRATORY     Status: Normal   Collection Time   09/29/11  1:15 PM      Component Value Range Status Comment   Specimen Description SPUTUM   Final    Special Requests NONE   Final    Gram Stain     Final    Value: FEW WBC PRESENT, PREDOMINANTLY PMN     RARE SQUAMOUS EPITHELIAL CELLS PRESENT     NO ORGANISMS SEEN   Culture FEW YEAST CONSISTENT WITH CANDIDA SPECIES   Final    Report Status 10/01/2011 FINAL   Final   CLOSTRIDIUM DIFFICILE BY PCR     Status: Normal   Collection Time   10/01/11 12:23 PM      Component Value Range Status Comment   C difficile by pcr NEGATIVE  NEGATIVE Final      Scheduled Meds:   . alum & mag hydroxide-simeth  15 mL Oral TID AC  . amiodarone  200 mg Oral Daily  . antiseptic oral rinse  15 mL Mouth Rinse BID  . atorvastatin  10 mg Oral q1800  . carvedilol  12.5 mg Oral BID WC  . cycloSPORINE  1 drop Both Eyes BID  . dexlansoprazole  60 mg Oral BID  . furosemide  60 mg Intravenous BID  . HYDROcodone-acetaminophen  1 tablet Oral TID  . omega-3 acid ethyl esters  1 g Oral BID    . psyllium  1 packet Oral Daily  . sodium chloride  3 mL Intravenous Q12H  . DISCONTD: furosemide  40 mg Intravenous BID  . DISCONTD: furosemide  80 mg Oral BID   Continuous Infusions:    Debbora Presto, MD  [facility] Pager [phone]  If 7PM-7AM, please contact night-coverage [email or web address] Password [email or web address] 10/08/2011, 2:05 PM   LOS: 15 days

## 2011-10-09 LAB — CBC
Platelets: 291 10*3/uL (ref 150–400)
RBC: 3.68 MIL/uL — ABNORMAL LOW (ref 4.22–5.81)
RDW: 17 % — ABNORMAL HIGH (ref 11.5–15.5)
WBC: 9 10*3/uL (ref 4.0–10.5)

## 2011-10-09 LAB — BASIC METABOLIC PANEL
CO2: 34 mEq/L — ABNORMAL HIGH (ref 19–32)
Chloride: 99 mEq/L (ref 96–112)
GFR calc Af Amer: 63 mL/min — ABNORMAL LOW (ref >90)
Potassium: 4.5 mEq/L (ref 3.5–5.1)

## 2011-10-09 MED ORDER — POTASSIUM CHLORIDE CRYS ER 20 MEQ PO TBCR
40.0000 meq | EXTENDED_RELEASE_TABLET | Freq: Once | ORAL | Status: AC
  Administered 2011-10-09: 40 meq via ORAL
  Filled 2011-10-09: qty 2

## 2011-10-09 MED ORDER — METOLAZONE 2.5 MG PO TABS
2.5000 mg | ORAL_TABLET | Freq: Once | ORAL | Status: AC
  Administered 2011-10-09: 2.5 mg via ORAL
  Filled 2011-10-09 (×2): qty 1

## 2011-10-09 NOTE — Progress Notes (Signed)
Patient ID: [name], male   DOB: 1932/04/24, 78 y.o.   MRN: [id number] Subjective:  My leg swelling is better  Objective:  Vital Signs in the last 24 hours: Temp:  [97.6 F (36.4 C)-98.6 F (37 C)] 98.3 F (36.8 C) (09/08 0500) Pulse Rate:  [84-103] 88  (09/08 0500) Resp:  [17-18] 18  (09/08 0500) BP: (94-132)/(60-78) 103/60 mmHg (09/08 0500) SpO2:  [93 %-97 %] 93 % (09/08 0500) Weight:  [189 lb 6 oz (85.9 kg)] 189 lb 6 oz (85.9 kg) (09/07 2053)  Intake/Output from previous day: 09/07 0701 - 09/08 0700 In: 720 [P.O.:720] Out: 500 [Urine:500] Intake/Output from this shift:    Physical Exam: Well appearing elderly man, NAD HEENT: Unremarkable Neck:  No JVD, no thyromegally Lungs:  Clear with no wheezes HEART:  Regular rate rhythm, no murmurs, no rubs, no clicks Abd:  Flat, positive bowel sounds, no organomegally, no rebound, no guarding Ext:  2 plus pulses, trace edema, no cyanosis, no clubbing, support stockings in place Skin:  No rashes no nodules Neuro:  CN II through XII intact, motor grossly intact  Lab Results:  Basename 10/09/11 0627 10/08/11 1009  WBC 9.0 10.3  HGB 10.5* 11.4*  PLT 291 309    Basename 10/09/11 0627 10/08/11 1537  NA 140 136  K 4.5 4.1  CL 99 97  CO2 34* 32  GLUCOSE 96 115*  BUN 10 11  CREATININE 1.24 1.30   No results found for this basename: TROPONINI:2,CK,MB:2 in the last 72 hours Hepatic Function Panel No results found for this basename: PROT,ALBUMIN,AST,ALT,ALKPHOS,BILITOT,BILIDIR,IBILI in the last 72 hours No results found for this basename: CHOL in the last 72 hours No results found for this basename: PROTIME in the last 72 hours  Imaging: No results found.  Cardiac Studies: No tele Assessment/Plan:  1. Acute/chronic/systolic/diastolic CHF - will order one time dose of metolazone today as weight has not gone down. His peripheral edema is improved with support stockings. Would expect he still needs 3-5 lbs of  diuresis. 2. Atrial fib - not on monitor. Has ICD. Rate appears to be controlled on exam.  LOS: 16 days    Buel Ream.D. 10/09/2011, 7:40 AM

## 2011-10-09 NOTE — Progress Notes (Signed)
Patient ID: [name], male   DOB: 1932/10/16, 78 y.o.   MRN: [id number]  [facility] PROGRESS NOTE  [name] JYN:[id number] DOB: Apr 03, 1932 DOA: 09/23/2011 PCP: Hoyle Sauer, MD  Brief narrative:  Pt is 78 yo male admitted 8/23 with weakness, N/V, abd pain. Adm dx included RUL PNA, severe sepsis. Transferred to [facility] 8/26. Has subsequently developed large R effusion. Underwent thoracentesis 9/3   Principal Problem:  *SIRS (systemic inflammatory response syndrome)  - now resolved and pt is off all antibiotics  - clinically stable and at baseline   Active Problems:  VENTRICULAR TACHYCARDIA  - HR controlled and at target range   Chronic atrial fibrillation  - stable and with HR controlled on physical exam   Chronic systolic heart failure- EF 35-40%  - less crackles on physical exam and unchanged lower extremity edema  - will continue Lasix and will monitor strict I's and O's  - monitor daily weights  - follow up on cardiology recommendations   AUTOMATIC IMPLANTABLE CARDIAC DEFIBRILLATOR SITU  - stable   Anemia, iron deficiency  - Hg and Hct are stable and at pt's baseline   HCAP (healthcare-associated pneumonia)  - completed course of antibiotics as noted below  - now off ABX and clinically stable   Diarrhea  - now improved  - continue supportive care   Hypophosphatemia  - check in AM and supplement as indicated   Pleural effusion  - status post thoracentesis 09/03 and doing well  - repeat CXR recommended in 2-3 weeks   Consultants:  PCCM  Cardiology  Procedures/Studies:  Dg Chest 1 View 10/04/2011  IMPRESSION:  No pneumothorax.  US Thoracentesis Asp Pleural Space W/img Guide 10/04/2011  IMPRESSION:  Successful ultrasound guided diagnostic right thoracentesis yielding 400 cc's of pleural fluid.  Right subclavian central line 09/23/2011 >>> discontinue 09/26/2011   Antibiotics:  1) Zosyn (09/23/11) -8/28 then 8/29-9/3  2) Vancomycin  (09/23/11) -8/28 then 8/29-9/3  3) Augmentin 8/28-8/29  4) Zithromax 8/28-8/29  5) Diflucan 8/30-9/3  6) Flagyl 8/31 - 9/3   Code Status: Full  Family Communication: Pt at bedside  Disposition Plan: Home when medically stable   HPI/Subjective: No events overnight.   Objective: Filed Vitals:   10/08/11 2053 10/09/11 0500 10/09/11 0848 10/09/11 1320  BP: 107/61 103/60 112/74 114/66  Pulse: 93 88 94 85  Temp: 98 F (36.7 C) 98.3 F (36.8 C) 98.2 F (36.8 C) 97.8 F (36.6 C)  TempSrc: Oral Oral Oral Oral  Resp: 18 18 18 18   Height:      Weight: 85.9 kg (189 lb 6 oz)     SpO2: 96% 93% 95% 96%    Intake/Output Summary (Last 24 hours) at 10/09/11 1550 Last data filed at 10/09/11 1300  Gross per 24 hour  Intake    720 ml  Output    625 ml  Net     95 ml    Exam:   General:  Pt is alert, follows commands appropriately, not in acute distress  Cardiovascular: Regular rate and rhythm, S1/S2, no murmurs, no rubs, no gallops  Respiratory: Clear to auscultation bilaterally, no wheezing, no crackles, no rhonchi  Abdomen: Soft, non tender, non distended, bowel sounds present, no guarding  Extremities: +1 bilateral lower extremity edema, pulses DP and PT palpable bilaterally  Neuro: Grossly nonfocal  Data Reviewed: Basic Metabolic Panel:  Lab 10/09/11 8657 10/08/11 1537 10/08/11 1009 10/07/11 0530 10/06/11 0500  NA 140 136 138 138 142  K  4.5 4.1 3.7 5.1 3.5  CL 99 97 97 96 100  CO2 34* 32 33* 34* 35*  GLUCOSE 96 115* 144* 103* 91  BUN 10 11 10 12 13   CREATININE 1.24 1.30 1.23 1.19 1.22  CALCIUM 8.8 8.5 8.7 8.7 8.5  MG -- -- -- -- --  PHOS -- -- -- -- --   Liver Function Tests: No results found for this basename: AST:5,ALT:5,ALKPHOS:5,BILITOT:5,PROT:5,ALBUMIN:5 in the last 168 hours No results found for this basename: LIPASE:5,AMYLASE:5 in the last 168 hours No results found for this basename: AMMONIA:5 in the last 168 hours CBC:  Lab 10/09/11 0627 10/08/11 1009  10/07/11 0530 10/06/11 0500 10/05/11 0530  WBC 9.0 10.3 12.9* 12.2* 11.5*  NEUTROABS -- -- -- -- --  HGB 10.5* 11.4* 11.4* 10.5* 10.8*  HCT 33.0* 36.0* 35.5* 32.9* 33.6*  MCV 89.7 89.1 89.6 89.6 89.4  PLT 291 309 340 293 304   Cardiac Enzymes: No results found for this basename: CKTOTAL:5,CKMB:5,CKMBINDEX:5,TROPONINI:5 in the last 168 hours BNP: No components found with this basename: POCBNP:5 CBG:  Lab 10/05/11 2213  GLUCAP 110*    Recent Results (from the past 240 hour(s))  CLOSTRIDIUM DIFFICILE BY PCR     Status: Normal   Collection Time   10/01/11 12:23 PM      Component Value Range Status Comment   C difficile by pcr NEGATIVE  NEGATIVE Final      Scheduled Meds:   . alum & mag hydroxide-simeth  15 mL Oral TID AC  . amiodarone  200 mg Oral Daily  . antiseptic oral rinse  15 mL Mouth Rinse BID  . atorvastatin  10 mg Oral q1800  . carvedilol  12.5 mg Oral BID WC  . cycloSPORINE  1 drop Both Eyes BID  . dexlansoprazole  60 mg Oral BID  . furosemide  60 mg Intravenous BID  . HYDROcodone-acetaminophen  1 tablet Oral TID  . metolazone  2.5 mg Oral Once  . omega-3 acid ethyl esters  1 g Oral BID  . potassium chloride  40 mEq Oral Once  . psyllium  1 packet Oral Daily  . sodium chloride  3 mL Intravenous Q12H   Continuous Infusions:    Debbora Presto, MD  [facility] Pager [phone]  If 7PM-7AM, please contact night-coverage [email or web address] Password [email or web address] 10/09/2011, 3:50 PM   LOS: 16 days

## 2011-10-10 LAB — CBC
HCT: 32.6 % — ABNORMAL LOW (ref 39.0–52.0)
Hemoglobin: 10.5 g/dL — ABNORMAL LOW (ref 13.0–17.0)
MCV: 88.6 fL (ref 78.0–100.0)
RBC: 3.68 MIL/uL — ABNORMAL LOW (ref 4.22–5.81)
WBC: 8.3 10*3/uL (ref 4.0–10.5)

## 2011-10-10 LAB — BASIC METABOLIC PANEL
BUN: 10 mg/dL (ref 6–23)
CO2: 35 mEq/L — ABNORMAL HIGH (ref 19–32)
Chloride: 94 mEq/L — ABNORMAL LOW (ref 96–112)
GFR calc Af Amer: 57 mL/min — ABNORMAL LOW (ref >90)
Glucose, Bld: 92 mg/dL (ref 70–99)
Potassium: 3.9 mEq/L (ref 3.5–5.1)

## 2011-10-10 MED ORDER — FUROSEMIDE 40 MG PO TABS
40.0000 mg | ORAL_TABLET | Freq: Two times a day (BID) | ORAL | Status: DC
  Administered 2011-10-10 – 2011-10-11 (×3): 40 mg via ORAL
  Filled 2011-10-10 (×5): qty 1

## 2011-10-10 NOTE — Progress Notes (Signed)
Patient ID: [name], male   DOB: 01-24-1933, 78 y.o.   MRN: [id number]  [facility] PROGRESS NOTE  [name]:[id number] DOB: November 29, 1932 DOA: 09/23/2011 PCP: Joshua Sauer, MD  Brief narrative:  Pt is 78 yo male admitted 8/23 with weakness, N/V, abd pain. Adm dx included RUL PNA, severe sepsis. Transferred to [facility] 8/26. Has subsequently developed large R effusion. Underwent thoracentesis 9/3   Principal Problem:  *SIRS (systemic inflammatory response syndrome)  - now resolved and pt is off all antibiotics  - clinically stable and at baseline  Active Problems:  VENTRICULAR TACHYCARDIA  - HR controlled and at target range  Chronic atrial fibrillation  - stable and with HR controlled on physical exam  Chronic systolic heart failure- EF 35-40%  - significant improvement in lower extremity edema nearly resolved - will continue Lasix and will monitor strict I's and O's  - monitor daily weights  - follow up on cardiology recommendations  AUTOMATIC IMPLANTABLE CARDIAC DEFIBRILLATOR SITU  - stable  Anemia, iron deficiency  - Hg and Hct are stable and at pt's baseline  HCAP (healthcare-associated pneumonia)  - completed course of antibiotics as noted below  - now off ABX and clinically stable  Diarrhea  - now improved  - continue supportive care  Hypophosphatemia  - stable Pleural effusion  - status post thoracentesis 09/03 and doing well  - repeat CXR recommended in 2-3 weeks   Consultants:  PCCM  Cardiology  Procedures/Studies:  Dg Chest 1 View 10/04/2011  IMPRESSION:  No pneumothorax.  US Thoracentesis Asp Pleural Space W/img Guide 10/04/2011  IMPRESSION:  Successful ultrasound guided diagnostic right thoracentesis yielding 400 cc's of pleural fluid.   Right subclavian central line 09/23/2011 >>> discontinue 09/26/2011    Antibiotics:  1) Zosyn (09/23/11) -8/28 then 8/29-9/3  2) Vancomycin (09/23/11) -8/28 then 8/29-9/3  3) Augmentin 8/28-8/29   4) Zithromax 8/28-8/29  5) Diflucan 8/30-9/3  6) Flagyl 8/31 - 9/3  Code Status: Full  Family Communication: Pt at bedside  Disposition Plan: Home in AM with HH PT   HPI/Subjective: No events overnight.   Objective: Filed Vitals:   10/09/11 2113 10/10/11 0534 10/10/11 0900 10/10/11 1317  BP: 100/59 96/50 111/59 121/72  Pulse: 92 98 88 107  Temp: 97.9 F (36.6 C) 97.5 F (36.4 C) 97.6 F (36.4 C) 97.6 F (36.4 C)  TempSrc: Oral Oral Oral Oral  Resp: 16 18 16 20   Height:      Weight: 82.101 kg (181 lb)     SpO2: 99% 100% 97% 97%    Intake/Output Summary (Last 24 hours) at 10/10/11 1334 Last data filed at 10/10/11 1300  Gross per 24 hour  Intake    480 ml  Output    720 ml  Net   -240 ml    Exam:   General:  Pt is alert, follows commands appropriately, not in acute distress  Cardiovascular: Regular rate and rhythm, S1/S2, no murmurs, no rubs, no gallops  Respiratory: Clear to auscultation bilaterally, no wheezing, no crackles, no rhonchi  Abdomen: Soft, non tender, non distended, bowel sounds present, no guarding  Extremities: No edema, pulses DP and PT palpable bilaterally  Neuro: Grossly nonfocal  Data Reviewed: Basic Metabolic Panel:  Lab 10/10/11 9562 10/09/11 0627 10/08/11 1537 10/08/11 1009 10/07/11 0530  NA 137 140 136 138 138  K 3.9 4.5 4.1 3.7 5.1  CL 94* 99 97 97 96  CO2 35* 34* 32 33* 34*  GLUCOSE 92 96  115* 144* 103*  BUN 10 10 11 10 12   CREATININE 1.35 1.24 1.30 1.23 1.19  CALCIUM 8.9 8.8 8.5 8.7 8.7  MG -- -- -- -- --  PHOS -- -- -- -- --   Liver Function Tests: No results found for this basename: AST:5,ALT:5,ALKPHOS:5,BILITOT:5,PROT:5,ALBUMIN:5 in the last 168 hours No results found for this basename: LIPASE:5,AMYLASE:5 in the last 168 hours No results found for this basename: AMMONIA:5 in the last 168 hours CBC:  Lab 10/10/11 0708 10/09/11 0627 10/08/11 1009 10/07/11 0530 10/06/11 0500  WBC 8.3 9.0 10.3 12.9* 12.2*  NEUTROABS  -- -- -- -- --  HGB 10.5* 10.5* 11.4* 11.4* 10.5*  HCT 32.6* 33.0* 36.0* 35.5* 32.9*  MCV 88.6 89.7 89.1 89.6 89.6  PLT 275 291 309 340 293   Cardiac Enzymes: No results found for this basename: CKTOTAL:5,CKMB:5,CKMBINDEX:5,TROPONINI:5 in the last 168 hours BNP: No components found with this basename: POCBNP:5 CBG:  Lab 10/05/11 2213  GLUCAP 110*    Recent Results (from the past 240 hour(s))  CLOSTRIDIUM DIFFICILE BY PCR     Status: Normal   Collection Time   10/01/11 12:23 PM      Component Value Range Status Comment   C difficile by pcr NEGATIVE  NEGATIVE Final      Scheduled Meds:   . alum & mag hydroxide-simeth  15 mL Oral TID AC  . amiodarone  200 mg Oral Daily  . antiseptic oral rinse  15 mL Mouth Rinse BID  . atorvastatin  10 mg Oral q1800  . carvedilol  12.5 mg Oral BID WC  . cycloSPORINE  1 drop Both Eyes BID  . dexlansoprazole  60 mg Oral BID  . furosemide  40 mg Oral BID  . HYDROcodone-acetaminophen  1 tablet Oral TID  . omega-3 acid ethyl esters  1 g Oral BID  . psyllium  1 packet Oral Daily  . sodium chloride  3 mL Intravenous Q12H  . DISCONTD: furosemide  60 mg Intravenous BID   Continuous Infusions:    Debbora Presto, MD  [facility] Pager [phone]  If 7PM-7AM, please contact night-coverage [email or web address] Password [email or web address] 10/10/2011, 1:34 PM   LOS: 17 days

## 2011-10-10 NOTE — Progress Notes (Signed)
Filed Vitals:   10/09/11 1759 10/09/11 2113 10/10/11 0534 10/10/11 0900  BP: 98/63 100/59 96/50 111/59  Pulse: 101 92 98 88  Temp: 97.6 F (36.4 C) 97.9 F (36.6 C) 97.5 F (36.4 C) 97.6 F (36.4 C)  TempSrc: Oral Oral Oral Oral  Resp: 17 16 18 16   Height:      Weight:  82.101 kg (181 lb)    SpO2: 95% 99% 100% 97%    Intake/Output Summary (Last 24 hours) at 10/10/11 1103 Last data filed at 10/09/11 2215  Gross per 24 hour  Intake    360 ml  Output    870 ml  Net   -510 ml    SUBJECTIVE Feels well. Denies any SOB. Swelling decreased.  LABS: Basic Metabolic Panel:  Basename 10/10/11 0708 10/09/11 0627  NA 137 140  K 3.9 4.5  CL 94* 99  CO2 35* 34*  GLUCOSE 92 96  BUN 10 10  CREATININE 1.35 1.24  CALCIUM 8.9 8.8  MG -- --  PHOS -- --    CBC:  Basename 10/10/11 0708 10/09/11 0627  WBC 8.3 9.0  NEUTROABS -- --  HGB 10.5* 10.5*  HCT 32.6* 33.0*  MCV 88.6 89.7  PLT 275 291   Radiology/Studies:  US Thoracentesis Asp Pleural Space W/img Guide  10/04/2011  *RADIOLOGY REPORT*  Clinical Data:  Pneumonia, CHF, bilateral pleural effusions, right greater than left; request is made for diagnostic right thoracentesis up to 400 cc's.  ULTRASOUND GUIDED DIAGNOSTIC  RIGHT  THORACENTESIS  An ultrasound guided thoracentesis was thoroughly discussed with the patient and questions answered.  The benefits, risks, alternatives and complications were also discussed.  The patient understands and wishes to proceed with the procedure.  Written consent was obtained.  Ultrasound was performed to localize and mark an adequate pocket of fluid in the right chest.  The area was then prepped and draped in the normal sterile fashion.  1% Lidocaine was used for local anesthesia.  Under ultrasound guidance a 19 gauge Yueh catheter was introduced.  Thoracentesis was performed.  The catheter was removed and a dressing applied.  Complications:  none  Findings: A total of approximately 400 cc's of  yellow fluid was removed. A fluid sample was sent for laboratory analysis.  IMPRESSION: Successful ultrasound guided diagnostic  right thoracentesis yielding 400 cc's of pleural fluid.  Read by: Jeananne Rama, P.A.-C   Original Report Authenticated By: Vilma Prader     PHYSICAL EXAM General: Well developed, elderly, in no acute distress. Head: Normocephalic, atraumatic, sclera non-icteric, no xanthomas, nares are without discharge. Neck: Negative for carotid bruits. JVD not elevated. Lungs: Clear bilaterally to auscultation without wheezes, rales, or rhonchi. Breathing is unlabored. Heart: RRR S1 S2 without murmurs, rubs, or gallops.  Abdomen: Soft, non-tender, non-distended with normoactive bowel sounds. No hepatomegaly. No rebound/guarding. No obvious abdominal masses. Msk:  Strength and tone appears normal for age. Extremities: No clubbing, cyanosis or edema.  Distal pedal pulses are 2+ and equal bilaterally. Neuro: Alert and oriented X 3. Moves all extremities spontaneously. Psych:  Responds to questions appropriately with a normal affect.  ASSESSMENT AND PLAN: 1. Acute on chronic combined systolic/diastolic CHF. Weight is at baseline. Clinically euvolemic. Will switch Lasix to 40 mg po bid. Was on 40 mg daily at home with an extra dose as needed.   2. Afib- rate controlled.  3. PNA with SIRS-resolved.  Principal Problem:  *SIRS (systemic inflammatory response syndrome) Active Problems:  Chronic systolic heart  failure- EF 35-40%  CAD (coronary artery disease)  VENTRICULAR TACHYCARDIA  Chronic atrial fibrillation  AUTOMATIC IMPLANTABLE CARDIAC DEFIBRILLATOR SITU  Bradycardia  Anemia, iron deficiency  Ataxia  Dehydration  History of subdural hemorrhage  Hypotension  HCAP (healthcare-associated pneumonia)  Thrombocytopenia  Weakness generalized  Diarrhea  Hypophosphatemia  Chest wall discomfort  Acute on chronic systolic CHF (congestive heart failure)  Pleural effusion   Anasarca  Pneumonia    Signed, Tajae Maiolo [place] MD,FACC 10/10/2011 11:07 AM

## 2011-10-10 NOTE — Progress Notes (Signed)
Physical Therapy Treatment Patient Details Name: [name] MRN: [id number] DOB: Aug 12, 1932 Today's Date: 10/10/2011 Time: 4098-1191 PT Time Calculation (min): 17 min  PT Assessment / Plan / Recommendation Comments on Treatment Session  O2 during ambulation set at 2L .  Pt. dropped to 88% at one point in walk, but remainder of checks were at 90 or greater on 2L.  Progressing distance and activity tolerance.  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 from PT standpoint    Follow Up Recommendations  Home health PT;Supervision - Intermittent    Barriers to Discharge        Equipment Recommendations   (home O2)    Recommendations for Other Services    Frequency Min 3X/week   Plan Discharge plan remains appropriate    Precautions / Restrictions Precautions Precautions: Fall (fall risk is slight, especially when using RW) Restrictions Other Position/Activity Restrictions: Use supplemental O2   Pertinent Vitals/Pain No distress O2 sats decr to 88% on 2 LO2 with amb    Mobility  Bed Mobility Bed Mobility: Supine to Sit Supine to Sit: 6: Modified independent (Device/Increase time);With rails;HOB elevated Details for Bed Mobility Assistance: no physical assist, safety cues needed Transfers Transfers: Sit to Stand;Stand to Sit Sit to Stand: 5: Supervision;From bed;With upper extremity assist Stand to Sit: 5: Supervision;To bed;With upper extremity assist Details for Transfer Assistance: Cues for safety, hand placement, technique Ambulation/Gait Ambulation/Gait Assistance: 5: Supervision Ambulation Distance (Feet): 250 Feet Assistive device: Rolling walker Ambulation/Gait Assistance Details: One standing rest break; Cues for upright posture and deep breathing Gait Pattern: Step-through pattern;Trunk flexed    Exercises     PT Diagnosis:    PT Problem List:   PT Treatment Interventions:     PT  Goals Acute Rehab PT Goals Time For Goal Achievement:  (should be going home tomorrow) Potential to Achieve Goals: Good Pt will go Supine/Side to Sit: Independently;with HOB 0 degrees PT Goal: Supine/Side to Sit - Progress: Progressing toward goal Pt will go Sit to Stand: with modified independence;with upper extremity assist PT Goal: Sit to Stand - Progress: Progressing toward goal Pt will go Stand to Sit: with modified independence;with upper extremity assist PT Goal: Stand to Sit - Progress: Progressing toward goal Pt will Ambulate: 51 - 150 feet;with modified independence;with least restrictive assistive device PT Goal: Ambulate - Progress: Progressing toward goal  Visit Information  Last PT Received On: 10/10/11 Assistance Needed: +1    Subjective Data  Subjective: Hoping to get home tomorrow; Is happy that swelling in feet and ankles is down Patient Stated Goal: go home   Cognition  Overall Cognitive Status: Appears within functional limits for tasks assessed/performed Arousal/Alertness: Awake/alert Orientation Level: Appears intact for tasks assessed Behavior During Session: [facility] for tasks performed    Balance     End of Session PT - End of Session Equipment Utilized During Treatment: Oxygen Activity Tolerance: Patient tolerated treatment well Patient left: in chair;with call bell/phone within reach   GP     Van Clines [facility] [place], [place] [phone]  10/10/2011, 1:19 PM

## 2011-10-11 DIAGNOSIS — I2510 Atherosclerotic heart disease of native coronary artery without angina pectoris: Secondary | ICD-10-CM

## 2011-10-11 DIAGNOSIS — Z95810 Presence of automatic (implantable) cardiac defibrillator: Secondary | ICD-10-CM

## 2011-10-11 LAB — CBC
HCT: 33.8 % — ABNORMAL LOW (ref 39.0–52.0)
Hemoglobin: 10.9 g/dL — ABNORMAL LOW (ref 13.0–17.0)
MCH: 28.6 pg (ref 26.0–34.0)
MCHC: 32.2 g/dL (ref 30.0–36.0)
MCV: 88.7 fL (ref 78.0–100.0)

## 2011-10-11 LAB — BASIC METABOLIC PANEL
BUN: 13 mg/dL (ref 6–23)
Creatinine, Ser: 1.37 mg/dL — ABNORMAL HIGH (ref 0.50–1.35)
GFR calc non Af Amer: 49 mL/min — ABNORMAL LOW (ref >90)
Glucose, Bld: 108 mg/dL — ABNORMAL HIGH (ref 70–99)
Potassium: 3.6 mEq/L (ref 3.5–5.1)

## 2011-10-11 MED ORDER — FUROSEMIDE 40 MG PO TABS: 40.0000 mg | ORAL_TABLET | Freq: Two times a day (BID) | ORAL | Status: DC

## 2011-10-11 MED ORDER — TRAMADOL HCL 50 MG PO TABS: 25.0000 mg | ORAL_TABLET | Freq: Four times a day (QID) | ORAL | Status: AC | PRN

## 2011-10-11 NOTE — Progress Notes (Signed)
Physical Therapy Treatment Patient Details Name: [name] MRN: [id number] DOB: 1932-12-31 Today's Date: 10/11/2011 Time: 7829-5621 PT Time Calculation (min): 29 min  PT Assessment / Plan / Recommendation Comments on Treatment Session  Pt glad to be going home today; O2 remained greater than or equal to 93% on room air, so no need for home O2; discussed progressing from RW to quad cane with HHPT    Follow Up Recommendations  Home health PT;Supervision - Intermittent    Barriers to Discharge        Equipment Recommendations  None recommended by PT    Recommendations for Other Services    Frequency Min 3X/week   Plan Discharge plan remains appropriate    Precautions / Restrictions Precautions Precaution Comments: Fall risk very slight, especially when pt uses RW   Pertinent Vitals/Pain O2 sats remained greater than or equal to 93% with amb on Room Air    Mobility  Transfers Transfers: Sit to Stand;Stand to Sit Sit to Stand: 6: Modified independent (Device/Increase time) Stand to Sit: 6: Modified independent (Device/Increase time) Details for Transfer Assistance: Smooth transitions Ambulation/Gait Ambulation/Gait Assistance: 5: Supervision Ambulation Distance (Feet): 240 Feet Assistive device: Rolling walker Ambulation/Gait Assistance Details: cues mostly for self-monitor for activity tol; One standing rest break Gait Pattern: Step-through pattern;Trunk flexed    Exercises     PT Diagnosis:    PT Problem List:   PT Treatment Interventions:     PT Goals Acute Rehab PT Goals Potential to Achieve Goals: Good Pt will go Sit to Stand: with modified independence;with upper extremity assist PT Goal: Sit to Stand - Progress: Met Pt will go Stand to Sit: with modified independence;with upper extremity assist PT Goal: Stand to Sit - Progress: Met Pt will Ambulate: 51 - 150 feet;with modified independence;with least restrictive assistive device PT Goal: Ambulate -  Progress: Progressing toward goal  Visit Information  Last PT Received On: 10/11/11 Assistance Needed: +1    Subjective Data  Subjective: Reports has not needed O2 overnight, and today Patient Stated Goal: go home   Cognition  Overall Cognitive Status: Appears within functional limits for tasks assessed/performed Arousal/Alertness: Awake/alert Orientation Level: Appears intact for tasks assessed Behavior During Session: [facility] for tasks performed    Balance     End of Session PT - End of Session Activity Tolerance: Patient tolerated treatment well Patient left: in chair;with call bell/phone within reach Nurse Communication: Mobility status   GP     Van Clines [facility] [place], [place] [phone]  10/11/2011, 4:26 PM

## 2011-10-11 NOTE — Progress Notes (Signed)
Filed Vitals:   10/10/11 1747 10/10/11 1948 10/11/11 0431 10/11/11 0859  BP: 103/51 118/57 97/60 93/50   Pulse: 97 106 86 106  Temp: 98 F (36.7 C) 97.7 F (36.5 C) 98.3 F (36.8 C) 98.2 F (36.8 C)  TempSrc: Oral Oral Oral   Resp: 20 20 18 18   Height:      Weight:  79.697 kg (175 lb 11.2 oz)    SpO2: 93% 94% 95% 96%    Intake/Output Summary (Last 24 hours) at 10/11/11 0911 Last data filed at 10/11/11 0700  Gross per 24 hour  Intake    940 ml  Output    820 ml  Net    120 ml    SUBJECTIVE Feels well. Denies any SOB. Swelling decreased.  LABS: Basic Metabolic Panel:  Basename 10/11/11 0640 10/10/11 0708  NA 139 137  K 3.6 3.9  CL 95* 94*  CO2 35* 35*  GLUCOSE 108* 92  BUN 13 10  CREATININE 1.37* 1.35  CALCIUM 9.0 8.9  MG -- --  PHOS -- --    CBC:  Basename 10/11/11 0640 10/10/11 0708  WBC 7.8 8.3  NEUTROABS -- --  HGB 10.9* 10.5*  HCT 33.8* 32.6*  MCV 88.7 88.6  PLT 292 275   Radiology/Studies:  US Thoracentesis Asp Pleural Space W/img Guide  10/04/2011  *RADIOLOGY REPORT*  Clinical Data:  Pneumonia, CHF, bilateral pleural effusions, right greater than left; request is made for diagnostic right thoracentesis up to 400 cc's.  ULTRASOUND GUIDED DIAGNOSTIC  RIGHT  THORACENTESIS  An ultrasound guided thoracentesis was thoroughly discussed with the patient and questions answered.  The benefits, risks, alternatives and complications were also discussed.  The patient understands and wishes to proceed with the procedure.  Written consent was obtained.  Ultrasound was performed to localize and mark an adequate pocket of fluid in the right chest.  The area was then prepped and draped in the normal sterile fashion.  1% Lidocaine was used for local anesthesia.  Under ultrasound guidance a 19 gauge Yueh catheter was introduced.  Thoracentesis was performed.  The catheter was removed and a dressing applied.  Complications:  none  Findings: A total of approximately 400 cc's  of yellow fluid was removed. A fluid sample was sent for laboratory analysis.  IMPRESSION: Successful ultrasound guided diagnostic  right thoracentesis yielding 400 cc's of pleural fluid.  Read by: Jeananne Rama, P.A.-C   Original Report Authenticated By: Vilma Prader     PHYSICAL EXAM General: Well developed, elderly, in no acute distress. Head: Normocephalic, atraumatic, sclera non-icteric, no xanthomas, nares are without discharge. Neck: Negative for carotid bruits. JVD not elevated. Lungs: Clear bilaterally to auscultation without wheezes, rales, or rhonchi. Breathing is unlabored. Heart: RRR S1 S2 without murmurs, rubs, or gallops.  Abdomen: Soft, non-tender, non-distended with normoactive bowel sounds. No hepatomegaly. No rebound/guarding. No obvious abdominal masses. Msk:  Strength and tone appears normal for age. Extremities: No clubbing, cyanosis or edema.  Distal pedal pulses are 2+ and equal bilaterally. Neuro: Alert and oriented X 3. Moves all extremities spontaneously. Psych:  Responds to questions appropriately with a normal affect.  ASSESSMENT AND PLAN: 1. Acute on chronic combined systolic/diastolic CHF. Weight is down. Clinically euvolemic. On Lasix to 40 mg po bid. Was on 40 mg daily at home with an extra dose as needed. OK for discharge from cardiac standpoint. Needs to weigh daily. Follow up in our office in 2 weeks.  2. Afib- rate controlled. Was in  atrial paced rhythm on admit. Went into afib. Rate controlled. Continue amiodarone. Not a candidate for anticoagulation due to history of intracerebral bleed.  3. PNA with SIRS-resolved.  Principal Problem:  *SIRS (systemic inflammatory response syndrome) Active Problems:  Chronic systolic heart failure- EF 35-40%  CAD (coronary artery disease)  VENTRICULAR TACHYCARDIA  Chronic atrial fibrillation  AUTOMATIC IMPLANTABLE CARDIAC DEFIBRILLATOR SITU  Bradycardia  Anemia, iron deficiency  Ataxia  Dehydration  History of  subdural hemorrhage  Hypotension  HCAP (healthcare-associated pneumonia)  Thrombocytopenia  Weakness generalized  Diarrhea  Hypophosphatemia  Chest wall discomfort  Acute on chronic systolic CHF (congestive heart failure)  Pleural effusion  Anasarca  Pneumonia    Signed, Julizza Sassone Swaziland MD,FACC 10/11/2011 9:11 AM
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
## 2011-10-11 NOTE — Progress Notes (Signed)
   CARE MANAGEMENT NOTE 10/11/2011  Patient:  [name], [name]   Account Number:  [id number]  Date Initiated:  09/26/2011  Documentation initiated by:  MAYO,HENRIETTA  Subjective/Objective Assessment:   78 yr-old male adm with SIRS; lives alone, has cane and hospital bed, h/o home health services through [facility]     Action/Plan:   Met with pt who validates that he wishes to use [facility] for [facility] and his daughter will be with him in this home for the next few weeks.   Anticipated DC Date:  10/10/2011   Anticipated DC Plan:  HOME W HOME HEALTH SERVICES      DC Planning Services  CM consult      Choice offered to / List presented to:          [facility] arranged  HH-1 RN  HH-2 PT      [facility] agency  [facility]   Status of service:  Completed, signed off Medicare Important Message given?   (If response is "NO", the following Medicare IM given date fields will be blank) Date Medicare IM given:   Date Additional Medicare IM given:    Discharge Disposition:  HOME W HOME HEALTH SERVICES  Per UR Regulation:  Reviewed for med. necessity/level of care/duration of stay  If discussed at Long Length of Stay Meetings, dates discussed:   09/29/2011  10/06/2011  10/11/2011    Comments:  PCP:  Dr. Joylene Draft Avva  09/29/2011 Darlyne Russian RN, CCM  [phone] THN:  active with program.

## 2011-10-12 DIAGNOSIS — R5383 Other fatigue: Secondary | ICD-10-CM | POA: Diagnosis not present

## 2011-10-12 DIAGNOSIS — I6200 Nontraumatic subdural hemorrhage, unspecified: Secondary | ICD-10-CM | POA: Diagnosis not present

## 2011-10-12 DIAGNOSIS — I2589 Other forms of chronic ischemic heart disease: Secondary | ICD-10-CM | POA: Diagnosis not present

## 2011-10-12 DIAGNOSIS — E119 Type 2 diabetes mellitus without complications: Secondary | ICD-10-CM | POA: Diagnosis not present

## 2011-10-13 DIAGNOSIS — I5022 Chronic systolic (congestive) heart failure: Secondary | ICD-10-CM | POA: Diagnosis not present

## 2011-10-13 DIAGNOSIS — R279 Unspecified lack of coordination: Secondary | ICD-10-CM | POA: Diagnosis not present

## 2011-10-13 DIAGNOSIS — I10 Essential (primary) hypertension: Secondary | ICD-10-CM | POA: Diagnosis not present

## 2011-10-13 DIAGNOSIS — R42 Dizziness and giddiness: Secondary | ICD-10-CM | POA: Diagnosis not present

## 2011-10-13 DIAGNOSIS — I4891 Unspecified atrial fibrillation: Secondary | ICD-10-CM | POA: Diagnosis not present

## 2011-10-17 DIAGNOSIS — R279 Unspecified lack of coordination: Secondary | ICD-10-CM | POA: Diagnosis not present

## 2011-10-17 DIAGNOSIS — I4891 Unspecified atrial fibrillation: Secondary | ICD-10-CM | POA: Diagnosis not present

## 2011-10-17 DIAGNOSIS — I10 Essential (primary) hypertension: Secondary | ICD-10-CM | POA: Diagnosis not present

## 2011-10-17 DIAGNOSIS — R42 Dizziness and giddiness: Secondary | ICD-10-CM | POA: Diagnosis not present

## 2011-10-17 DIAGNOSIS — I5022 Chronic systolic (congestive) heart failure: Secondary | ICD-10-CM | POA: Diagnosis not present

## 2011-10-18 ENCOUNTER — Other Ambulatory Visit: Payer: Medicare Other | Admitting: Lab

## 2011-10-18 ENCOUNTER — Telehealth: Payer: Self-pay | Admitting: Oncology

## 2011-10-18 ENCOUNTER — Ambulatory Visit: Payer: Medicare Other | Admitting: Oncology

## 2011-10-18 DIAGNOSIS — R279 Unspecified lack of coordination: Secondary | ICD-10-CM | POA: Diagnosis not present

## 2011-10-18 DIAGNOSIS — I5022 Chronic systolic (congestive) heart failure: Secondary | ICD-10-CM | POA: Diagnosis not present

## 2011-10-18 DIAGNOSIS — R42 Dizziness and giddiness: Secondary | ICD-10-CM | POA: Diagnosis not present

## 2011-10-18 DIAGNOSIS — I4891 Unspecified atrial fibrillation: Secondary | ICD-10-CM | POA: Diagnosis not present

## 2011-10-18 DIAGNOSIS — I10 Essential (primary) hypertension: Secondary | ICD-10-CM | POA: Diagnosis not present

## 2011-10-18 NOTE — Telephone Encounter (Signed)
pt called to cx todays appt and will c/b to r/s   aom

## 2011-10-19 DIAGNOSIS — I10 Essential (primary) hypertension: Secondary | ICD-10-CM | POA: Diagnosis not present

## 2011-10-19 DIAGNOSIS — R42 Dizziness and giddiness: Secondary | ICD-10-CM | POA: Diagnosis not present

## 2011-10-19 DIAGNOSIS — I5022 Chronic systolic (congestive) heart failure: Secondary | ICD-10-CM | POA: Diagnosis not present

## 2011-10-19 DIAGNOSIS — R279 Unspecified lack of coordination: Secondary | ICD-10-CM | POA: Diagnosis not present

## 2011-10-19 DIAGNOSIS — I4891 Unspecified atrial fibrillation: Secondary | ICD-10-CM | POA: Diagnosis not present

## 2011-10-20 DIAGNOSIS — R42 Dizziness and giddiness: Secondary | ICD-10-CM | POA: Diagnosis not present

## 2011-10-20 DIAGNOSIS — R279 Unspecified lack of coordination: Secondary | ICD-10-CM | POA: Diagnosis not present

## 2011-10-20 DIAGNOSIS — I5022 Chronic systolic (congestive) heart failure: Secondary | ICD-10-CM | POA: Diagnosis not present

## 2011-10-20 DIAGNOSIS — I4891 Unspecified atrial fibrillation: Secondary | ICD-10-CM | POA: Diagnosis not present

## 2011-10-20 DIAGNOSIS — I10 Essential (primary) hypertension: Secondary | ICD-10-CM | POA: Diagnosis not present

## 2011-10-21 DIAGNOSIS — D649 Anemia, unspecified: Secondary | ICD-10-CM | POA: Diagnosis not present

## 2011-10-21 DIAGNOSIS — J189 Pneumonia, unspecified organism: Secondary | ICD-10-CM | POA: Diagnosis not present

## 2011-10-21 DIAGNOSIS — I509 Heart failure, unspecified: Secondary | ICD-10-CM | POA: Diagnosis not present

## 2011-10-21 DIAGNOSIS — Z23 Encounter for immunization: Secondary | ICD-10-CM | POA: Diagnosis not present

## 2011-10-21 DIAGNOSIS — I69919 Unspecified symptoms and signs involving cognitive functions following unspecified cerebrovascular disease: Secondary | ICD-10-CM | POA: Diagnosis not present

## 2011-10-21 DIAGNOSIS — J90 Pleural effusion, not elsewhere classified: Secondary | ICD-10-CM | POA: Diagnosis not present

## 2011-10-21 DIAGNOSIS — I5023 Acute on chronic systolic (congestive) heart failure: Secondary | ICD-10-CM | POA: Diagnosis not present

## 2011-10-21 DIAGNOSIS — I10 Essential (primary) hypertension: Secondary | ICD-10-CM | POA: Diagnosis not present

## 2011-10-21 DIAGNOSIS — M47817 Spondylosis without myelopathy or radiculopathy, lumbosacral region: Secondary | ICD-10-CM | POA: Diagnosis not present

## 2011-10-21 DIAGNOSIS — I2589 Other forms of chronic ischemic heart disease: Secondary | ICD-10-CM | POA: Diagnosis not present

## 2011-10-24 ENCOUNTER — Encounter: Payer: Self-pay | Admitting: Nurse Practitioner

## 2011-10-24 ENCOUNTER — Ambulatory Visit (INDEPENDENT_AMBULATORY_CARE_PROVIDER_SITE_OTHER): Payer: Medicare Other | Admitting: Nurse Practitioner

## 2011-10-24 VITALS — BP 122/54 | HR 59 | Ht 72.0 in | Wt 178.0 lb

## 2011-10-24 DIAGNOSIS — I10 Essential (primary) hypertension: Secondary | ICD-10-CM | POA: Diagnosis not present

## 2011-10-24 DIAGNOSIS — I2589 Other forms of chronic ischemic heart disease: Secondary | ICD-10-CM

## 2011-10-24 DIAGNOSIS — R0989 Other specified symptoms and signs involving the circulatory and respiratory systems: Secondary | ICD-10-CM | POA: Diagnosis not present

## 2011-10-24 DIAGNOSIS — R0600 Dyspnea, unspecified: Secondary | ICD-10-CM

## 2011-10-24 DIAGNOSIS — I69919 Unspecified symptoms and signs involving cognitive functions following unspecified cerebrovascular disease: Secondary | ICD-10-CM | POA: Diagnosis not present

## 2011-10-24 DIAGNOSIS — I255 Ischemic cardiomyopathy: Secondary | ICD-10-CM

## 2011-10-24 DIAGNOSIS — R0609 Other forms of dyspnea: Secondary | ICD-10-CM

## 2011-10-24 DIAGNOSIS — D649 Anemia, unspecified: Secondary | ICD-10-CM | POA: Diagnosis not present

## 2011-10-24 DIAGNOSIS — M47817 Spondylosis without myelopathy or radiculopathy, lumbosacral region: Secondary | ICD-10-CM | POA: Diagnosis not present

## 2011-10-24 DIAGNOSIS — I5023 Acute on chronic systolic (congestive) heart failure: Secondary | ICD-10-CM | POA: Diagnosis not present

## 2011-10-24 LAB — BRAIN NATRIURETIC PEPTIDE: Pro B Natriuretic peptide (BNP): 277 pg/mL — ABNORMAL HIGH (ref 0.0–100.0)
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
## 2011-10-24 NOTE — Patient Instructions (Addendum)
For now, stay on your current medicines  We need to check some labs today.   I am going to see you in 2 weeks.  Call the [facility] office at [phone] if you have any questions, problems or concerns.

## 2011-10-25 ENCOUNTER — Telehealth: Payer: Self-pay | Admitting: *Deleted

## 2011-10-25 ENCOUNTER — Telehealth: Payer: Self-pay | Admitting: Oncology

## 2011-10-25 DIAGNOSIS — I5023 Acute on chronic systolic (congestive) heart failure: Secondary | ICD-10-CM | POA: Diagnosis not present

## 2011-10-25 DIAGNOSIS — I10 Essential (primary) hypertension: Secondary | ICD-10-CM | POA: Diagnosis not present

## 2011-10-25 DIAGNOSIS — M47817 Spondylosis without myelopathy or radiculopathy, lumbosacral region: Secondary | ICD-10-CM | POA: Diagnosis not present

## 2011-10-25 DIAGNOSIS — D649 Anemia, unspecified: Secondary | ICD-10-CM | POA: Diagnosis not present

## 2011-10-25 DIAGNOSIS — I69919 Unspecified symptoms and signs involving cognitive functions following unspecified cerebrovascular disease: Secondary | ICD-10-CM | POA: Diagnosis not present

## 2011-10-25 LAB — BASIC METABOLIC PANEL
BUN: 16 mg/dL (ref 6–23)
CO2: 26 mEq/L (ref 19–32)
Calcium: 8.9 mg/dL (ref 8.4–10.5)
Chloride: 103 mEq/L (ref 96–112)
Creatinine, Ser: 1.4 mg/dL (ref 0.4–1.5)
GFR: 52.27 mL/min — ABNORMAL LOW (ref >60.00)
Glucose, Bld: 100 mg/dL — ABNORMAL HIGH (ref 70–99)
Potassium: 4 mEq/L (ref 3.5–5.1)
Sodium: 143 mEq/L (ref 135–145)

## 2011-10-25 NOTE — Telephone Encounter (Signed)
pt had called to r/s 9/17 appt,done,called,    aom

## 2011-10-25 NOTE — Telephone Encounter (Signed)
Message copied by Awilda Bill on Tue Oct 25, 2011  4:33 PM ------      Message from: Rosalio Macadamia      Created: Tue Oct 25, 2011  4:24 PM       Ok to report. Labs are satisfactory.

## 2011-10-25 NOTE — Telephone Encounter (Signed)
Attempted to call patient regarding lab work.  Phone rang and rang with no answer, no machine.  Will call back later. Vista Mink, CMA

## 2011-10-25 NOTE — Telephone Encounter (Signed)
Message copied by Awilda Bill on Tue Oct 25, 2011  4:31 PM ------      Message from: Rosalio Macadamia      Created: Tue Oct 25, 2011  4:24 PM       Ok to report. Labs are satisfactory.

## 2011-10-27 ENCOUNTER — Telehealth: Payer: Self-pay | Admitting: *Deleted

## 2011-10-27 DIAGNOSIS — I10 Essential (primary) hypertension: Secondary | ICD-10-CM | POA: Diagnosis not present

## 2011-10-27 DIAGNOSIS — I2589 Other forms of chronic ischemic heart disease: Secondary | ICD-10-CM | POA: Diagnosis not present

## 2011-10-27 DIAGNOSIS — J90 Pleural effusion, not elsewhere classified: Secondary | ICD-10-CM | POA: Diagnosis not present

## 2011-10-27 DIAGNOSIS — M47817 Spondylosis without myelopathy or radiculopathy, lumbosacral region: Secondary | ICD-10-CM | POA: Diagnosis not present

## 2011-10-27 DIAGNOSIS — I509 Heart failure, unspecified: Secondary | ICD-10-CM | POA: Diagnosis not present

## 2011-10-27 DIAGNOSIS — J189 Pneumonia, unspecified organism: Secondary | ICD-10-CM | POA: Diagnosis not present

## 2011-10-27 DIAGNOSIS — D649 Anemia, unspecified: Secondary | ICD-10-CM | POA: Diagnosis not present

## 2011-10-27 DIAGNOSIS — I5023 Acute on chronic systolic (congestive) heart failure: Secondary | ICD-10-CM | POA: Diagnosis not present

## 2011-10-27 DIAGNOSIS — I69919 Unspecified symptoms and signs involving cognitive functions following unspecified cerebrovascular disease: Secondary | ICD-10-CM | POA: Diagnosis not present

## 2011-10-27 NOTE — Telephone Encounter (Signed)
Pt aware of lab results.  Amanda Becker, CMA 

## 2011-10-27 NOTE — Telephone Encounter (Signed)
Message copied by Awilda Bill on Thu Oct 27, 2011  9:16 AM ------      Message from: Rosalio Macadamia      Created: Tue Oct 25, 2011  4:24 PM       Ok to report. Labs are satisfactory.

## 2011-10-28 DIAGNOSIS — D649 Anemia, unspecified: Secondary | ICD-10-CM | POA: Diagnosis not present

## 2011-10-28 DIAGNOSIS — I509 Heart failure, unspecified: Secondary | ICD-10-CM | POA: Diagnosis not present

## 2011-10-28 DIAGNOSIS — J90 Pleural effusion, not elsewhere classified: Secondary | ICD-10-CM | POA: Diagnosis not present

## 2011-10-28 DIAGNOSIS — I10 Essential (primary) hypertension: Secondary | ICD-10-CM | POA: Diagnosis not present

## 2011-10-28 DIAGNOSIS — I5023 Acute on chronic systolic (congestive) heart failure: Secondary | ICD-10-CM | POA: Diagnosis not present

## 2011-10-28 DIAGNOSIS — M47817 Spondylosis without myelopathy or radiculopathy, lumbosacral region: Secondary | ICD-10-CM | POA: Diagnosis not present

## 2011-10-28 DIAGNOSIS — E119 Type 2 diabetes mellitus without complications: Secondary | ICD-10-CM | POA: Diagnosis not present

## 2011-10-28 DIAGNOSIS — I69919 Unspecified symptoms and signs involving cognitive functions following unspecified cerebrovascular disease: Secondary | ICD-10-CM | POA: Diagnosis not present

## 2011-10-31 DIAGNOSIS — I10 Essential (primary) hypertension: Secondary | ICD-10-CM | POA: Diagnosis not present

## 2011-10-31 DIAGNOSIS — I5023 Acute on chronic systolic (congestive) heart failure: Secondary | ICD-10-CM | POA: Diagnosis not present

## 2011-10-31 DIAGNOSIS — M47817 Spondylosis without myelopathy or radiculopathy, lumbosacral region: Secondary | ICD-10-CM | POA: Diagnosis not present

## 2011-10-31 DIAGNOSIS — I69919 Unspecified symptoms and signs involving cognitive functions following unspecified cerebrovascular disease: Secondary | ICD-10-CM | POA: Diagnosis not present

## 2011-10-31 DIAGNOSIS — D649 Anemia, unspecified: Secondary | ICD-10-CM | POA: Diagnosis not present

## 2011-11-01 DIAGNOSIS — I5023 Acute on chronic systolic (congestive) heart failure: Secondary | ICD-10-CM | POA: Diagnosis not present

## 2011-11-01 DIAGNOSIS — M47817 Spondylosis without myelopathy or radiculopathy, lumbosacral region: Secondary | ICD-10-CM | POA: Diagnosis not present

## 2011-11-01 DIAGNOSIS — I69919 Unspecified symptoms and signs involving cognitive functions following unspecified cerebrovascular disease: Secondary | ICD-10-CM | POA: Diagnosis not present

## 2011-11-01 DIAGNOSIS — D649 Anemia, unspecified: Secondary | ICD-10-CM | POA: Diagnosis not present

## 2011-11-01 DIAGNOSIS — I10 Essential (primary) hypertension: Secondary | ICD-10-CM | POA: Diagnosis not present

## 2011-11-02 ENCOUNTER — Encounter: Payer: Self-pay | Admitting: *Deleted

## 2011-11-03 DIAGNOSIS — M47817 Spondylosis without myelopathy or radiculopathy, lumbosacral region: Secondary | ICD-10-CM | POA: Diagnosis not present

## 2011-11-03 DIAGNOSIS — I10 Essential (primary) hypertension: Secondary | ICD-10-CM | POA: Diagnosis not present

## 2011-11-03 DIAGNOSIS — D649 Anemia, unspecified: Secondary | ICD-10-CM | POA: Diagnosis not present

## 2011-11-03 DIAGNOSIS — I69919 Unspecified symptoms and signs involving cognitive functions following unspecified cerebrovascular disease: Secondary | ICD-10-CM | POA: Diagnosis not present

## 2011-11-03 DIAGNOSIS — I5023 Acute on chronic systolic (congestive) heart failure: Secondary | ICD-10-CM | POA: Diagnosis not present

## 2011-11-04 DIAGNOSIS — I10 Essential (primary) hypertension: Secondary | ICD-10-CM | POA: Diagnosis not present

## 2011-11-04 DIAGNOSIS — D649 Anemia, unspecified: Secondary | ICD-10-CM | POA: Diagnosis not present

## 2011-11-04 DIAGNOSIS — I69919 Unspecified symptoms and signs involving cognitive functions following unspecified cerebrovascular disease: Secondary | ICD-10-CM | POA: Diagnosis not present

## 2011-11-04 DIAGNOSIS — I5023 Acute on chronic systolic (congestive) heart failure: Secondary | ICD-10-CM | POA: Diagnosis not present

## 2011-11-04 DIAGNOSIS — M47817 Spondylosis without myelopathy or radiculopathy, lumbosacral region: Secondary | ICD-10-CM | POA: Diagnosis not present

## 2011-11-07 ENCOUNTER — Ambulatory Visit (INDEPENDENT_AMBULATORY_CARE_PROVIDER_SITE_OTHER): Payer: Medicare Other | Admitting: *Deleted

## 2011-11-07 ENCOUNTER — Telehealth: Payer: Self-pay | Admitting: *Deleted

## 2011-11-07 ENCOUNTER — Encounter: Payer: Self-pay | Admitting: Nurse Practitioner

## 2011-11-07 ENCOUNTER — Ambulatory Visit (INDEPENDENT_AMBULATORY_CARE_PROVIDER_SITE_OTHER): Payer: Medicare Other | Admitting: Nurse Practitioner

## 2011-11-07 VITALS — BP 102/52 | HR 93 | Ht 72.0 in | Wt 175.4 lb

## 2011-11-07 DIAGNOSIS — I4891 Unspecified atrial fibrillation: Secondary | ICD-10-CM

## 2011-11-07 DIAGNOSIS — I5023 Acute on chronic systolic (congestive) heart failure: Secondary | ICD-10-CM | POA: Diagnosis not present

## 2011-11-07 DIAGNOSIS — I5022 Chronic systolic (congestive) heart failure: Secondary | ICD-10-CM

## 2011-11-07 DIAGNOSIS — Z95810 Presence of automatic (implantable) cardiac defibrillator: Secondary | ICD-10-CM

## 2011-11-07 DIAGNOSIS — Z79899 Other long term (current) drug therapy: Secondary | ICD-10-CM | POA: Diagnosis not present

## 2011-11-07 DIAGNOSIS — D649 Anemia, unspecified: Secondary | ICD-10-CM | POA: Diagnosis not present

## 2011-11-07 DIAGNOSIS — M47817 Spondylosis without myelopathy or radiculopathy, lumbosacral region: Secondary | ICD-10-CM | POA: Diagnosis not present

## 2011-11-07 DIAGNOSIS — I498 Other specified cardiac arrhythmias: Secondary | ICD-10-CM

## 2011-11-07 DIAGNOSIS — E0590 Thyrotoxicosis, unspecified without thyrotoxic crisis or storm: Secondary | ICD-10-CM | POA: Diagnosis not present

## 2011-11-07 DIAGNOSIS — I69919 Unspecified symptoms and signs involving cognitive functions following unspecified cerebrovascular disease: Secondary | ICD-10-CM | POA: Diagnosis not present

## 2011-11-07 DIAGNOSIS — R001 Bradycardia, unspecified: Secondary | ICD-10-CM

## 2011-11-07 DIAGNOSIS — I10 Essential (primary) hypertension: Secondary | ICD-10-CM | POA: Diagnosis not present

## 2011-11-07 LAB — ICD DEVICE OBSERVATION
AL THRESHOLD: 0.75 V
ATRIAL PACING ICD: 0.32 pct
PACEART VT: 0
RV LEAD IMPEDENCE ICD: 475 Ohm
TOT-0001: 1
TOT-0002: 0
TOT-0006: 20130419000000
TZAT-0001ATACH: 1
TZAT-0001FASTVT: 1
TZAT-0001SLOWVT: 1
TZAT-0001SLOWVT: 2
TZAT-0002ATACH: NEGATIVE
TZAT-0002ATACH: NEGATIVE
TZAT-0002ATACH: NEGATIVE
TZAT-0002FASTVT: NEGATIVE
TZAT-0005SLOWVT: 88 pct
TZAT-0005SLOWVT: 91 pct
TZAT-0011SLOWVT: 10 ms
TZAT-0011SLOWVT: 10 ms
TZAT-0012ATACH: 150 ms
TZAT-0013SLOWVT: 3
TZAT-0013SLOWVT: 4
TZAT-0018ATACH: NEGATIVE
TZAT-0018ATACH: NEGATIVE
TZAT-0018SLOWVT: NEGATIVE
TZAT-0018SLOWVT: NEGATIVE
TZAT-0019ATACH: 6 V
TZAT-0019ATACH: 6 V
TZAT-0019FASTVT: 8 V
TZAT-0019SLOWVT: 8 V
TZAT-0019SLOWVT: 8 V
TZAT-0020ATACH: 1.5 ms
TZON-0004SLOWVT: 28
TZON-0005SLOWVT: 12
TZST-0001ATACH: 5
TZST-0001FASTVT: 3
TZST-0001FASTVT: 5
TZST-0001SLOWVT: 4
TZST-0001SLOWVT: 6
TZST-0002ATACH: NEGATIVE
TZST-0002ATACH: NEGATIVE
TZST-0002FASTVT: NEGATIVE
TZST-0002FASTVT: NEGATIVE
TZST-0002FASTVT: NEGATIVE
TZST-0002FASTVT: NEGATIVE
TZST-0003SLOWVT: 35 J
TZST-0003SLOWVT: 35 J
VENTRICULAR PACING ICD: 4.18 pct
VF: 0

## 2011-11-07 LAB — CBC WITH DIFFERENTIAL/PLATELET
Basophils Absolute: 0 10*3/uL (ref 0.0–0.1)
Basophils Relative: 0.5 % (ref 0.0–3.0)
Eosinophils Absolute: 0.1 10*3/uL (ref 0.0–0.7)
Eosinophils Relative: 1.8 % (ref 0.0–5.0)
HCT: 36.5 % — ABNORMAL LOW (ref 39.0–52.0)
Hemoglobin: 11.8 g/dL — ABNORMAL LOW (ref 13.0–17.0)
Lymphocytes Relative: 22.4 % (ref 12.0–46.0)
Lymphs Abs: 1.6 10*3/uL (ref 0.7–4.0)
MCHC: 32.3 g/dL (ref 30.0–36.0)
MCV: 89.5 fl (ref 78.0–100.0)
Monocytes Absolute: 1.4 10*3/uL — ABNORMAL HIGH (ref 0.1–1.0)
Monocytes Relative: 18.9 % — ABNORMAL HIGH (ref 3.0–12.0)
Neutro Abs: 4.1 10*3/uL (ref 1.4–7.7)
Neutrophils Relative %: 56.4 % (ref 43.0–77.0)
Platelets: 224 10*3/uL (ref 150.0–400.0)
RBC: 4.07 Mil/uL — ABNORMAL LOW (ref 4.22–5.81)
RDW: 17 % — ABNORMAL HIGH (ref 11.5–14.6)
WBC: 7.4 10*3/uL (ref 4.5–10.5)

## 2011-11-07 LAB — BASIC METABOLIC PANEL
BUN: 20 mg/dL (ref 6–23)
CO2: 29 mEq/L (ref 19–32)
Calcium: 8.7 mg/dL (ref 8.4–10.5)
Chloride: 103 mEq/L (ref 96–112)
Creatinine, Ser: 1.2 mg/dL (ref 0.4–1.5)
GFR: 62.44 mL/min (ref >60.00)
Glucose, Bld: 97 mg/dL (ref 70–99)
Potassium: 3.6 mEq/L (ref 3.5–5.1)
Sodium: 139 mEq/L (ref 135–145)

## 2011-11-07 LAB — TSH: TSH: 0.27 u[IU]/mL — ABNORMAL LOW (ref 0.35–5.50)

## 2011-11-07 NOTE — Progress Notes (Signed)
icd check in clinic  
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
## 2011-11-07 NOTE — Patient Instructions (Addendum)
Stay on your current medicines.  We need to check labs today  Keep up with weighing each day  Minimize your salt.  We will get you to see Dr. Swaziland in about 2 months.  Call the [facility] office at [phone] if you have any questions, problems or concerns.

## 2011-11-08 ENCOUNTER — Telehealth: Payer: Self-pay | Admitting: Cardiology

## 2011-11-08 MED ORDER — NITROGLYCERIN 0.4 MG SL SUBL: 0.4000 mg | SUBLINGUAL_TABLET | SUBLINGUAL | Status: DC | PRN

## 2011-11-08 NOTE — Telephone Encounter (Signed)
plz return call to patient regarding request for Nitro pills [phone]

## 2011-11-08 NOTE — Telephone Encounter (Signed)
Spoke to patient he stated he needed refill on NTG tablets.Prescription sent to [facility].

## 2011-11-09 DIAGNOSIS — I5023 Acute on chronic systolic (congestive) heart failure: Secondary | ICD-10-CM | POA: Diagnosis not present

## 2011-11-09 DIAGNOSIS — I10 Essential (primary) hypertension: Secondary | ICD-10-CM | POA: Diagnosis not present

## 2011-11-09 DIAGNOSIS — M47817 Spondylosis without myelopathy or radiculopathy, lumbosacral region: Secondary | ICD-10-CM | POA: Diagnosis not present

## 2011-11-09 DIAGNOSIS — I69919 Unspecified symptoms and signs involving cognitive functions following unspecified cerebrovascular disease: Secondary | ICD-10-CM | POA: Diagnosis not present

## 2011-11-09 DIAGNOSIS — D649 Anemia, unspecified: Secondary | ICD-10-CM | POA: Diagnosis not present

## 2011-11-10 DIAGNOSIS — M47817 Spondylosis without myelopathy or radiculopathy, lumbosacral region: Secondary | ICD-10-CM | POA: Diagnosis not present

## 2011-11-10 DIAGNOSIS — D649 Anemia, unspecified: Secondary | ICD-10-CM | POA: Diagnosis not present

## 2011-11-10 DIAGNOSIS — I5023 Acute on chronic systolic (congestive) heart failure: Secondary | ICD-10-CM | POA: Diagnosis not present

## 2011-11-10 DIAGNOSIS — I10 Essential (primary) hypertension: Secondary | ICD-10-CM | POA: Diagnosis not present

## 2011-11-10 DIAGNOSIS — I69919 Unspecified symptoms and signs involving cognitive functions following unspecified cerebrovascular disease: Secondary | ICD-10-CM | POA: Diagnosis not present

## 2011-11-11 ENCOUNTER — Encounter: Payer: Self-pay | Admitting: Oncology

## 2011-11-11 ENCOUNTER — Ambulatory Visit (HOSPITAL_BASED_OUTPATIENT_CLINIC_OR_DEPARTMENT_OTHER): Payer: Medicare Other | Admitting: Oncology

## 2011-11-11 ENCOUNTER — Other Ambulatory Visit (HOSPITAL_BASED_OUTPATIENT_CLINIC_OR_DEPARTMENT_OTHER): Payer: Medicare Other

## 2011-11-11 VITALS — BP 101/62 | HR 99 | Temp 97.1°F | Resp 20 | Ht 72.0 in | Wt 176.2 lb

## 2011-11-11 DIAGNOSIS — D509 Iron deficiency anemia, unspecified: Secondary | ICD-10-CM | POA: Diagnosis not present

## 2011-11-11 DIAGNOSIS — D649 Anemia, unspecified: Secondary | ICD-10-CM | POA: Diagnosis not present

## 2011-11-11 DIAGNOSIS — I509 Heart failure, unspecified: Secondary | ICD-10-CM | POA: Diagnosis not present

## 2011-11-11 DIAGNOSIS — J189 Pneumonia, unspecified organism: Secondary | ICD-10-CM | POA: Diagnosis not present

## 2011-11-11 DIAGNOSIS — J90 Pleural effusion, not elsewhere classified: Secondary | ICD-10-CM | POA: Diagnosis not present

## 2011-11-11 LAB — COMPREHENSIVE METABOLIC PANEL
Albumin: 3.3 g/dL — ABNORMAL LOW (ref 3.5–5.2)
BUN: 19 mg/dL (ref 6–23)
CO2: 29 mEq/L (ref 19–32)
Calcium: 9.1 mg/dL (ref 8.4–10.5)
Chloride: 103 mEq/L (ref 96–112)
Glucose, Bld: 111 mg/dL — ABNORMAL HIGH (ref 70–99)
Potassium: 4.1 mEq/L (ref 3.5–5.3)

## 2011-11-11 LAB — CBC WITH DIFFERENTIAL/PLATELET
BASO%: 0.6 % (ref 0.0–2.0)
EOS%: 1.9 % (ref 0.0–7.0)
HCT: 35.2 % — ABNORMAL LOW (ref 38.4–49.9)
LYMPH%: 24.5 % (ref 14.0–49.0)
MCH: 29.4 pg (ref 27.2–33.4)
MCHC: 33.4 g/dL (ref 32.0–36.0)
NEUT%: 53.3 % (ref 39.0–75.0)
Platelets: 230 10*3/uL (ref 140–400)
lymph#: 1.7 10*3/uL (ref 0.9–3.3)

## 2011-11-11 NOTE — Progress Notes (Signed)
This office note has been dictated.  #[id number]

## 2011-11-11 NOTE — Progress Notes (Signed)
CC:   [name], M.D. [name], M.D. Erick Blinks, MD  PROBLEM LIST:  1. History of ischemic cardiomyopathy/chronic systolic congestive  heart failure with an ejection fraction of 34% to 40%.  2. The patient has ICD defibrillator unit, changed 05/20/2011.  3. Chronic obstructive pulmonary disease.  4. Coronary artery disease status post CABG in 1983 and PCI/stent,  2004.  5. History of ventricular tachycardia.  6. History of erythrocytosis treated in the past with phlebotomy. JAK2 mutation was not detected.  7. Current history of iron deficiency state. Patient received  Feraheme 255 mg on October 21, 2010, 510 mg on 11/16/2010 and 510  mg on 03/08/2011.  8. GERD.  9. Diabetes mellitus, type 2.  10. Dyslipidemia.  11. Chronic anticoagulation therapy with Coumadin.  12. Hypertension.  13. Osteoarthritis especially involving the right knee.  14. Abnormal thyroid imaging studies from 11/09/2010, status post  ultrasound guided fine needle aspiration of the dominant left  inferior thyroid nodule on 12/15/2010. Cytology report showed rare  follicular epithelial cells and hemosiderin laden macrophages. 15. Recent history of iron deficiency state.  The patient received     Feraheme 255 mg on 10/21/2010, 510 mg on 11/16/2010, 03/08/2011 and     on 08/08/2011.  The patient underwent colonoscopy by Dr. Erick Blinks     on 07/07/2011 and was found to have colonic angiodysplasia and     diverticulosis.  The patient had been on Coumadin which was     discontinued in late 09/2011. 16. Diverticulosis involving the left colon noted on colonoscopy on     07/07/2011. 17. Angiodysplasia involving the ascending colon noted on colonoscopy     on 07/07/2011. 18. Tubular adenomas biopsied from the splenic flexure on colonoscopy     on 07/07/2011. 19. Subacute/chronic bilateral subdural hematomas noted on CT scan of     the head without IV contrast on 08/12/2011. 20. Admission to the hospital from  09/23/2011 through 10/11/2011 for     right upper lobe pneumonia associated with a large right pleural     effusion.  Right thoracentesis was carried out on 10/04/2011 with     benign cytology.  MEDICATIONS: 1. Amiodarone 200 mg daily. 2. Dulcolax 10 mg suppository as needed. 3. Calcium carbonate 3 tablets by mouth as needed. 4. Carboxymethylcellulose 1% ophthalmic solution 1% one drop into both     eyes 3 times daily as needed for dry eyes. 5. Coreg 12.5 mg twice daily. 6. Cyclosporine ophthalmic emulsion 0.05% one drop into both eyes     twice daily. 7. Dexilant 60 mg daily. 8. Dulera 100-5 two puffs. 9. Flonase nasal spray 2 sprays into the nose daily. 10.Lasix 40 mg daily. 11.Lisinopril 10 mg daily. 12.Claritin 10 mg daily. 13.Nitrostat 0.4 mg tablet under the tongue as needed for chest pain. 14.Lovaza 1 g twice daily. 15.MiraLAX 17 g twice daily as needed for constipation. 16.K-Dur 10 mEq daily. 17.Crestor 5 mg once a week. 18.Ultram 25 mg every 6 hours as needed.  Pneumovax current status unknown.  The patient was encouraged to inquire about this from Dr. Felipa Eth. Flu shot administered on 10/12/2011.  SMOKING HISTORY:  The patient smoked 2 packs of cigarettes a day for 20 years.  He stopped smoking when he was in his mid 3s.  HISTORY:  I saw Trace Wirick today for followup of his iron deficiency anemia.  Mr. Prosch was last seen by [place] on 06/30/2011.  Since then he has had a rather eventful time  of it.  He did undergo a colonoscopy by Dr. Erick Blinks on 06/06.  He was noted to have mild diverticulosis of the left colon, some AVMs in the ascending colon that were treated with APC.  He had some colonic polyps in the region of the splenic flexure that showed tubular adenomatous changes.  We have been following the patient's CBC and ferritin on a monthly basis.  When we saw him on 06/30/2011 his ferritin was 28.  On 07/28/2011 ferritin was 20 and we set the patient up for  another dose of Feraheme 510 mg which he received on 08/08/2011.  On 08/25/2011 ferritin was 142 and on 09/15/2011 ferritin was 55.  The patient denies any obvious blood in his stools.  He required admission to the hospital from 09/23/2011 through 10/11/2011 with a right upper lobe pneumonia and subsequent large right pleural effusion.  The patient was admitted at [facility].  He is not sure whether he was in the intensive care unit.  He does not remember much about his admission.  He is receiving physical therapy at home where he lives alone.  He continues to drive, is fairly self sufficient.  He gets around with a rolling walker.  He has not been falling.  Pleural fluid cytology was benign.  It was recommended that the patient stop Coumadin in view of a CT scan of the brain carried out in July showing subacute and chronic bilateral subdural hematomas.  The patient does not have any recollection of falling and striking his head.  He also tells me that aspirin was discontinued as well.  The patient is here today for followup of his iron deficiency state felt to be due to subacute GI bleeding.  It will be recalled that stools were positive back in 03/2011.  The patient is without any major complaints today, seems to be getting along reasonably well, although he is quite frail.  Extensive hospital records were reviewed during this appointment.  PHYSICAL EXAM:  The patient does look frail.  Weight is 176.2 pounds, height 6 feet even, body surface area 2.01 m2.  Blood pressure 101/62. Other vital signs are normal although his pulse is irregular consistent with atrial fibrillation.  The patient is frail appearing.  He has lost approximately 10 pounds since his last visit here in late May.  There is no scleral icterus.  Mouth and pharynx are benign.  No peripheral adenopathy palpable.  Lungs were clear to percussion and auscultation. I could not appreciate decreased breath sounds or dullness at  the right base.  There is a defibrillator unit in the left upper chest in the infraclavicular region.  Rhythm seems irregular consistent with atrial fibrillation.  No murmur or rub.  Abdomen with the patient sitting is benign.  No axillary adenopathy.  Extremities:  Bilateral ankle edema, right greater than left, 1+ on the right.  He has purpura over his arms. Neurologic exam was nonfocal.  The patient does use a rolling walker. He denies any recent falls.  LABORATORY DATA:  Today, white count 7.1, ANC 3.8, hemoglobin 11.7, hematocrit 35.2, platelets 230,000.  MCV 88.2, MCH 29.4.  Differential was normal.  On 09/15/2011 prior to the patient's admission to the hospital his hemoglobin was 12.8, hematocrit 38.4 and on 08/25/2011 hemoglobin was 12.5 and hematocrit 37.3.  Chemistries today were notable for an albumin of 3.3.  Iron studies today are pending as is an LDH.  IMAGING STUDIES:  1. On 11/09/2010 nuclear medicine 24 hour thyroid uptake  showed a low  24 hour uptake of 0.8%.  2. Chest x-ray from 11/27/2010 showed borderline cardiomegaly with a  right greater than left pleural effusion, mild associated  atelectasis but no pulmonary vascular congestion.  3. CT scan of the chest without IV contrast on 11/27/2010 showed mild  patchy opacity in the left lower lobe possibly infectious, moderate  right and trace left pleural effusion with associated lower lobe  atelectasis, moderate centrilobular emphysematous changes with no  suspicious pulmonary nodules, bilateral thyroid nodules measuring  up to 4.6 cm on the left.  4. Chest x-ray, 2 views, on 12/31/2010 showed stable chest findings  with persistent right pleural effusion and basilar densities.  5. Ultrasound guided needle aspirate biopsy of the thyroid gland on  12/14/2009 was carried out.  6. Chest x-ray, 2 view, on 05/21/2011  showed interval placement of a left anterior chest wall AICD/pacemaker  without evidence of complication.  There was an interval decrease in the  previously identified small bilateral effusions.  7. CT scan of the head without IV contrast showed findings consistent     with subacute/chronic bilateral subdural hematomas with small area     of more acute hemorrhage identified bilaterally. 8. Chest x-ray, 2 view, from 08/17/2011 showed chronic findings     related to the pacemaker and epicardial devices.  No acute process     was evident. 9. Acute abdominal series with 2 views of the abdomen and 1 view of     the chest on 09/23/2011 showed right upper lobe pneumonia.  There     was a nonobstructive bowel gas pattern and a question of bilateral     renal calculi. 10. Portable chest x-ray, 1 view, from 09/29/2011 showed increased     right pleural effusion with associated consolidation, atelectasis     versus pneumonia.  There may be trace pleural fluid/opacity at the     left lung base as well.  Right upper lobe consolidation appeared     similar to prior studies. 11. CT chest scan without contrast from 09/29/2011 showed right upper     lobe consolidation (new from July) consistent with pneumonia.     There were additional patchy airspace opacities throughout all     lobes.  There were moderate right and small left pleural effusions.     There was no evidence for empyema on this noncontrast examination.     There was underlying moderate emphysema and stable bilateral     thyroid nodules.  PROCEDURES: 1. Upper endoscopy carried out on 02/11/2011 by Dr. Jeani Hawking     showed a question of portal hypertensive gastroscopy and     questionable distal esophageal varix. 2. Colonoscopy carried out by Dr. Erick Blinks on 07/07/2011 showed mild     diverticulosis in the left colon, polyps seen in the descending and     sigmoid colon which were biopsied and showed tubular adenomatous     changes.  There were also 2 AVMs found in the ascending colon that     were successfully ablated with  APC.  IMPRESSION AND PLAN:  Mr. Snide has had an eventful time of it since his last visit here on 06/30/2011.  As noted, we did find that he was iron deficient which we suspect is due to subacute GI bleeding.  He was on Coumadin at the time, also aspirin but is not on these agents at the present time.  Mr. Erstad did receive IV Feraheme 510 mg on  08/08/2011. He may require additional IV iron.  We will continue to monitor CBC and ferritin every 4 weeks.  We will plan to see Mr. Thomley again in 4 months which will be around 03/13/2012 at which time we will check CBC, chemistries and iron studies.  Extensive records were reviewed during this appointment.    ______________________________ Samul Dada, M.D. DSM/MEDQ  D:  11/11/2011  T:  11/11/2011  Job:  [id number]

## 2011-11-11 NOTE — Patient Instructions (Signed)
Continue with labs every 4 weeks. We will arrange for you to have intravenous iron if you need it.

## 2011-11-14 ENCOUNTER — Telehealth: Payer: Self-pay | Admitting: Oncology

## 2011-11-14 DIAGNOSIS — M47817 Spondylosis without myelopathy or radiculopathy, lumbosacral region: Secondary | ICD-10-CM | POA: Diagnosis not present

## 2011-11-14 DIAGNOSIS — I5023 Acute on chronic systolic (congestive) heart failure: Secondary | ICD-10-CM | POA: Diagnosis not present

## 2011-11-14 DIAGNOSIS — I10 Essential (primary) hypertension: Secondary | ICD-10-CM | POA: Diagnosis not present

## 2011-11-14 DIAGNOSIS — D649 Anemia, unspecified: Secondary | ICD-10-CM | POA: Diagnosis not present

## 2011-11-14 DIAGNOSIS — I69919 Unspecified symptoms and signs involving cognitive functions following unspecified cerebrovascular disease: Secondary | ICD-10-CM | POA: Diagnosis not present

## 2011-11-14 NOTE — Telephone Encounter (Signed)
called and l/m with 11/8 appt and mailed    aom

## 2011-11-15 DIAGNOSIS — L259 Unspecified contact dermatitis, unspecified cause: Secondary | ICD-10-CM | POA: Diagnosis not present

## 2011-11-16 DIAGNOSIS — I5023 Acute on chronic systolic (congestive) heart failure: Secondary | ICD-10-CM | POA: Diagnosis not present

## 2011-11-16 DIAGNOSIS — I10 Essential (primary) hypertension: Secondary | ICD-10-CM | POA: Diagnosis not present

## 2011-11-16 DIAGNOSIS — D649 Anemia, unspecified: Secondary | ICD-10-CM | POA: Diagnosis not present

## 2011-11-16 DIAGNOSIS — M47817 Spondylosis without myelopathy or radiculopathy, lumbosacral region: Secondary | ICD-10-CM | POA: Diagnosis not present

## 2011-11-16 DIAGNOSIS — I69919 Unspecified symptoms and signs involving cognitive functions following unspecified cerebrovascular disease: Secondary | ICD-10-CM | POA: Diagnosis not present

## 2011-11-17 DIAGNOSIS — M47817 Spondylosis without myelopathy or radiculopathy, lumbosacral region: Secondary | ICD-10-CM | POA: Diagnosis not present

## 2011-11-17 DIAGNOSIS — I69919 Unspecified symptoms and signs involving cognitive functions following unspecified cerebrovascular disease: Secondary | ICD-10-CM | POA: Diagnosis not present

## 2011-11-17 DIAGNOSIS — I10 Essential (primary) hypertension: Secondary | ICD-10-CM | POA: Diagnosis not present

## 2011-11-17 DIAGNOSIS — D649 Anemia, unspecified: Secondary | ICD-10-CM | POA: Diagnosis not present

## 2011-11-17 DIAGNOSIS — I5023 Acute on chronic systolic (congestive) heart failure: Secondary | ICD-10-CM | POA: Diagnosis not present

## 2011-11-18 DIAGNOSIS — I5023 Acute on chronic systolic (congestive) heart failure: Secondary | ICD-10-CM | POA: Diagnosis not present

## 2011-11-18 DIAGNOSIS — I10 Essential (primary) hypertension: Secondary | ICD-10-CM | POA: Diagnosis not present

## 2011-11-18 DIAGNOSIS — I69919 Unspecified symptoms and signs involving cognitive functions following unspecified cerebrovascular disease: Secondary | ICD-10-CM | POA: Diagnosis not present

## 2011-11-18 DIAGNOSIS — D649 Anemia, unspecified: Secondary | ICD-10-CM | POA: Diagnosis not present

## 2011-11-18 DIAGNOSIS — M47817 Spondylosis without myelopathy or radiculopathy, lumbosacral region: Secondary | ICD-10-CM | POA: Diagnosis not present

## 2011-11-21 DIAGNOSIS — D649 Anemia, unspecified: Secondary | ICD-10-CM | POA: Diagnosis not present

## 2011-11-21 DIAGNOSIS — I5023 Acute on chronic systolic (congestive) heart failure: Secondary | ICD-10-CM | POA: Diagnosis not present

## 2011-11-21 DIAGNOSIS — I10 Essential (primary) hypertension: Secondary | ICD-10-CM | POA: Diagnosis not present

## 2011-11-21 DIAGNOSIS — M47817 Spondylosis without myelopathy or radiculopathy, lumbosacral region: Secondary | ICD-10-CM | POA: Diagnosis not present

## 2011-11-21 DIAGNOSIS — I69919 Unspecified symptoms and signs involving cognitive functions following unspecified cerebrovascular disease: Secondary | ICD-10-CM | POA: Diagnosis not present

## 2011-11-28 ENCOUNTER — Encounter: Payer: Self-pay | Admitting: Internal Medicine

## 2011-11-28 DIAGNOSIS — I69919 Unspecified symptoms and signs involving cognitive functions following unspecified cerebrovascular disease: Secondary | ICD-10-CM | POA: Diagnosis not present

## 2011-11-28 DIAGNOSIS — I5023 Acute on chronic systolic (congestive) heart failure: Secondary | ICD-10-CM | POA: Diagnosis not present

## 2011-11-28 DIAGNOSIS — M47817 Spondylosis without myelopathy or radiculopathy, lumbosacral region: Secondary | ICD-10-CM | POA: Diagnosis not present

## 2011-11-28 DIAGNOSIS — D649 Anemia, unspecified: Secondary | ICD-10-CM | POA: Diagnosis not present

## 2011-11-28 DIAGNOSIS — I10 Essential (primary) hypertension: Secondary | ICD-10-CM | POA: Diagnosis not present

## 2011-12-05 ENCOUNTER — Encounter: Payer: Medicare Other | Admitting: *Deleted

## 2011-12-08 ENCOUNTER — Other Ambulatory Visit: Payer: Self-pay | Admitting: Oncology

## 2011-12-09 ENCOUNTER — Other Ambulatory Visit (HOSPITAL_BASED_OUTPATIENT_CLINIC_OR_DEPARTMENT_OTHER): Payer: Medicare Other | Admitting: Lab

## 2011-12-09 ENCOUNTER — Ambulatory Visit: Payer: Medicare Other

## 2011-12-09 DIAGNOSIS — D509 Iron deficiency anemia, unspecified: Secondary | ICD-10-CM | POA: Diagnosis not present

## 2011-12-09 LAB — CBC WITH DIFFERENTIAL/PLATELET
BASO%: 0.5 % (ref 0.0–2.0)
Eosinophils Absolute: 0.1 10*3/uL (ref 0.0–0.5)
MONO#: 1.6 10*3/uL — ABNORMAL HIGH (ref 0.1–0.9)
MONO%: 19.9 % — ABNORMAL HIGH (ref 0.0–14.0)
NEUT#: 4.5 10*3/uL (ref 1.5–6.5)
RBC: 4.26 10*6/uL (ref 4.20–5.82)
RDW: 15.8 % — ABNORMAL HIGH (ref 11.0–14.6)
WBC: 8 10*3/uL (ref 4.0–10.3)

## 2011-12-09 LAB — FERRITIN: Ferritin: 42 ng/mL (ref 22–322)

## 2011-12-13 ENCOUNTER — Encounter: Payer: Self-pay | Admitting: *Deleted

## 2011-12-19 ENCOUNTER — Encounter: Payer: Self-pay | Admitting: Internal Medicine

## 2011-12-19 ENCOUNTER — Ambulatory Visit (INDEPENDENT_AMBULATORY_CARE_PROVIDER_SITE_OTHER): Payer: Medicare Other | Admitting: *Deleted

## 2011-12-19 DIAGNOSIS — I509 Heart failure, unspecified: Secondary | ICD-10-CM | POA: Diagnosis not present

## 2011-12-19 DIAGNOSIS — I5023 Acute on chronic systolic (congestive) heart failure: Secondary | ICD-10-CM | POA: Diagnosis not present

## 2011-12-19 DIAGNOSIS — I472 Ventricular tachycardia, unspecified: Secondary | ICD-10-CM

## 2011-12-19 DIAGNOSIS — Z95810 Presence of automatic (implantable) cardiac defibrillator: Secondary | ICD-10-CM | POA: Diagnosis not present

## 2011-12-19 DIAGNOSIS — I4729 Other ventricular tachycardia: Secondary | ICD-10-CM

## 2011-12-21 LAB — REMOTE ICD DEVICE
AL AMPLITUDE: 0.5 mv
BAMS-0001: 170 {beats}/min
BATTERY VOLTAGE: 3.1705 V
BRDY-0003RV: 130 {beats}/min
CHARGE TIME: 8.778 s
DEV-0020ICD: NEGATIVE
PACEART VT: 0
RV LEAD AMPLITUDE: 12.8 mv
RV LEAD IMPEDENCE ICD: 494 Ohm
RV LEAD THRESHOLD: 1.25 V
TOT-0002: 0
TOT-0006: 20130419000000
TZAT-0001ATACH: 1
TZAT-0001ATACH: 2
TZAT-0001ATACH: 3
TZAT-0001SLOWVT: 2
TZAT-0002ATACH: NEGATIVE
TZAT-0004SLOWVT: 8
TZAT-0004SLOWVT: 8
TZAT-0005SLOWVT: 88 pct
TZAT-0005SLOWVT: 91 pct
TZAT-0012ATACH: 150 ms
TZAT-0012ATACH: 150 ms
TZAT-0012ATACH: 150 ms
TZAT-0012FASTVT: 170 ms
TZAT-0012SLOWVT: 170 ms
TZAT-0012SLOWVT: 170 ms
TZAT-0013SLOWVT: 3
TZAT-0013SLOWVT: 4
TZAT-0018ATACH: NEGATIVE
TZAT-0018ATACH: NEGATIVE
TZAT-0018FASTVT: NEGATIVE
TZAT-0019ATACH: 6 V
TZAT-0019FASTVT: 8 V
TZAT-0020ATACH: 1.5 ms
TZAT-0020FASTVT: 1.5 ms
TZAT-0020SLOWVT: 1.5 ms
TZAT-0020SLOWVT: 1.5 ms
TZON-0003ATACH: 350 ms
TZON-0003SLOWVT: 330 ms
TZON-0003VSLOWVT: 450 ms
TZON-0004SLOWVT: 28
TZON-0004VSLOWVT: 20
TZST-0001ATACH: 4
TZST-0001ATACH: 6
TZST-0001FASTVT: 2
TZST-0001FASTVT: 6
TZST-0001SLOWVT: 3
TZST-0001SLOWVT: 5
TZST-0001SLOWVT: 6
TZST-0002FASTVT: NEGATIVE
TZST-0002FASTVT: NEGATIVE
TZST-0002FASTVT: NEGATIVE
TZST-0003SLOWVT: 20 J
TZST-0003SLOWVT: 35 J
TZST-0003SLOWVT: 35 J

## 2011-12-22 DIAGNOSIS — I509 Heart failure, unspecified: Secondary | ICD-10-CM | POA: Diagnosis not present

## 2011-12-22 DIAGNOSIS — D649 Anemia, unspecified: Secondary | ICD-10-CM | POA: Diagnosis not present

## 2011-12-22 DIAGNOSIS — J90 Pleural effusion, not elsewhere classified: Secondary | ICD-10-CM | POA: Diagnosis not present

## 2011-12-22 DIAGNOSIS — A6000 Herpesviral infection of urogenital system, unspecified: Secondary | ICD-10-CM | POA: Diagnosis not present

## 2012-01-06 ENCOUNTER — Other Ambulatory Visit (HOSPITAL_BASED_OUTPATIENT_CLINIC_OR_DEPARTMENT_OTHER): Payer: Medicare Other

## 2012-01-06 ENCOUNTER — Ambulatory Visit: Payer: Medicare Other

## 2012-01-06 DIAGNOSIS — D509 Iron deficiency anemia, unspecified: Secondary | ICD-10-CM

## 2012-01-06 LAB — CBC WITH DIFFERENTIAL/PLATELET
Basophils Absolute: 0 10*3/uL (ref 0.0–0.1)
Eosinophils Absolute: 0.1 10*3/uL (ref 0.0–0.5)
HCT: 39.1 % (ref 38.4–49.9)
HGB: 12.9 g/dL — ABNORMAL LOW (ref 13.0–17.1)
MCV: 87.2 fL (ref 79.3–98.0)
MONO%: 22.5 % — ABNORMAL HIGH (ref 0.0–14.0)
NEUT#: 4.2 10*3/uL (ref 1.5–6.5)
NEUT%: 54.2 % (ref 39.0–75.0)
RDW: 15.7 % — ABNORMAL HIGH (ref 11.0–14.6)
lymph#: 1.6 10*3/uL (ref 0.9–3.3)

## 2012-01-10 ENCOUNTER — Encounter: Payer: Self-pay | Admitting: Cardiology

## 2012-01-10 ENCOUNTER — Ambulatory Visit: Payer: Medicare Other | Admitting: Cardiology

## 2012-01-10 ENCOUNTER — Ambulatory Visit (INDEPENDENT_AMBULATORY_CARE_PROVIDER_SITE_OTHER): Payer: Medicare Other | Admitting: Cardiology

## 2012-01-10 VITALS — BP 100/58 | HR 74 | Ht 72.0 in | Wt 177.1 lb

## 2012-01-10 DIAGNOSIS — I472 Ventricular tachycardia, unspecified: Secondary | ICD-10-CM

## 2012-01-10 DIAGNOSIS — I4891 Unspecified atrial fibrillation: Secondary | ICD-10-CM | POA: Diagnosis not present

## 2012-01-10 DIAGNOSIS — I2510 Atherosclerotic heart disease of native coronary artery without angina pectoris: Secondary | ICD-10-CM | POA: Diagnosis not present

## 2012-01-10 DIAGNOSIS — I5022 Chronic systolic (congestive) heart failure: Secondary | ICD-10-CM | POA: Diagnosis not present
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
## 2012-01-10 NOTE — Patient Instructions (Signed)
Continue your current therapy and use an extra Lasix as needed.  I will see you again in 3 months.

## 2012-01-11 ENCOUNTER — Encounter: Payer: Self-pay | Admitting: *Deleted

## 2012-02-03 ENCOUNTER — Ambulatory Visit: Payer: Medicare Other

## 2012-02-03 ENCOUNTER — Other Ambulatory Visit (HOSPITAL_BASED_OUTPATIENT_CLINIC_OR_DEPARTMENT_OTHER): Payer: Medicare Other | Admitting: Lab

## 2012-02-03 DIAGNOSIS — D509 Iron deficiency anemia, unspecified: Secondary | ICD-10-CM | POA: Diagnosis not present

## 2012-02-03 LAB — CBC WITH DIFFERENTIAL/PLATELET
Basophils Absolute: 0 10*3/uL (ref 0.0–0.1)
Eosinophils Absolute: 0.1 10*3/uL (ref 0.0–0.5)
HCT: 38.7 % (ref 38.4–49.9)
LYMPH%: 22.1 % (ref 14.0–49.0)
MONO#: 1.7 10*3/uL — ABNORMAL HIGH (ref 0.1–0.9)
NEUT#: 4.8 10*3/uL (ref 1.5–6.5)
NEUT%: 56.8 % (ref 39.0–75.0)
Platelets: 196 10*3/uL (ref 140–400)
WBC: 8.4 10*3/uL (ref 4.0–10.3)

## 2012-02-06 DIAGNOSIS — R269 Unspecified abnormalities of gait and mobility: Secondary | ICD-10-CM | POA: Diagnosis not present

## 2012-02-06 DIAGNOSIS — I509 Heart failure, unspecified: Secondary | ICD-10-CM | POA: Diagnosis not present

## 2012-02-06 DIAGNOSIS — I2589 Other forms of chronic ischemic heart disease: Secondary | ICD-10-CM | POA: Diagnosis not present

## 2012-02-06 DIAGNOSIS — J90 Pleural effusion, not elsewhere classified: Secondary | ICD-10-CM | POA: Diagnosis not present

## 2012-03-01 ENCOUNTER — Other Ambulatory Visit: Payer: Self-pay

## 2012-03-01 DIAGNOSIS — D509 Iron deficiency anemia, unspecified: Secondary | ICD-10-CM

## 2012-03-02 ENCOUNTER — Ambulatory Visit: Payer: Medicare Other

## 2012-03-02 ENCOUNTER — Telehealth: Payer: Self-pay

## 2012-03-02 ENCOUNTER — Other Ambulatory Visit: Payer: Medicare Other | Admitting: Lab

## 2012-03-02 NOTE — Telephone Encounter (Signed)
Pt called stating he missed his lab appt this AM. I transferred call to scheduler to have it done next week.

## 2012-03-07 ENCOUNTER — Other Ambulatory Visit: Payer: Self-pay | Admitting: Medical Oncology

## 2012-03-07 ENCOUNTER — Ambulatory Visit: Payer: Medicare Other

## 2012-03-07 ENCOUNTER — Other Ambulatory Visit: Payer: Self-pay | Admitting: Oncology

## 2012-03-07 ENCOUNTER — Other Ambulatory Visit (HOSPITAL_BASED_OUTPATIENT_CLINIC_OR_DEPARTMENT_OTHER): Payer: Medicare Other | Admitting: Lab

## 2012-03-07 DIAGNOSIS — D509 Iron deficiency anemia, unspecified: Secondary | ICD-10-CM | POA: Diagnosis not present

## 2012-03-07 LAB — CBC WITH DIFFERENTIAL/PLATELET
Basophils Absolute: 0 10*3/uL (ref 0.0–0.1)
EOS%: 1.3 % (ref 0.0–7.0)
HCT: 38.3 % — ABNORMAL LOW (ref 38.4–49.9)
HGB: 12.8 g/dL — ABNORMAL LOW (ref 13.0–17.1)
MCH: 28.5 pg (ref 27.2–33.4)
MCV: 85 fL (ref 79.3–98.0)
MONO%: 18.5 % — ABNORMAL HIGH (ref 0.0–14.0)
NEUT%: 55.4 % (ref 39.0–75.0)
Platelets: 176 10*3/uL (ref 140–400)
lymph#: 1.6 10*3/uL (ref 0.9–3.3)

## 2012-03-07 LAB — FERRITIN: Ferritin: 28 ng/mL (ref 22–322)

## 2012-03-15 ENCOUNTER — Other Ambulatory Visit: Payer: Self-pay

## 2012-03-15 ENCOUNTER — Telehealth: Payer: Self-pay | Admitting: Oncology

## 2012-03-15 NOTE — Telephone Encounter (Signed)
lvm for pt regarding to 2.17.14 appt.Marland KitchenMarland KitchenMarland Kitchen

## 2012-03-19 ENCOUNTER — Encounter: Payer: Self-pay | Admitting: Oncology

## 2012-03-19 ENCOUNTER — Other Ambulatory Visit (HOSPITAL_BASED_OUTPATIENT_CLINIC_OR_DEPARTMENT_OTHER): Payer: Medicare Other | Admitting: Lab

## 2012-03-19 ENCOUNTER — Ambulatory Visit (HOSPITAL_BASED_OUTPATIENT_CLINIC_OR_DEPARTMENT_OTHER): Payer: Medicare Other | Admitting: Oncology

## 2012-03-19 VITALS — BP 102/59 | HR 83 | Temp 96.7°F | Resp 20 | Ht 72.0 in | Wt 181.8 lb

## 2012-03-19 DIAGNOSIS — D509 Iron deficiency anemia, unspecified: Secondary | ICD-10-CM

## 2012-03-19 LAB — CBC WITH DIFFERENTIAL/PLATELET
BASO%: 0.3 % (ref 0.0–2.0)
LYMPH%: 27.5 % (ref 14.0–49.0)
MCHC: 33.2 g/dL (ref 32.0–36.0)
MONO#: 1.3 10*3/uL — ABNORMAL HIGH (ref 0.1–0.9)
Platelets: 160 10*3/uL (ref 140–400)
RBC: 4.54 10*6/uL (ref 4.20–5.82)
RDW: 16.8 % — ABNORMAL HIGH (ref 11.0–14.6)
WBC: 6.8 10*3/uL (ref 4.0–10.3)
lymph#: 1.9 10*3/uL (ref 0.9–3.3)

## 2012-03-19 LAB — COMPREHENSIVE METABOLIC PANEL (CC13)
ALT: 18 U/L (ref 0–55)
Alkaline Phosphatase: 114 U/L (ref 40–150)
CO2: 29 mEq/L (ref 22–29)
Sodium: 143 mEq/L (ref 136–145)
Total Bilirubin: 0.67 mg/dL (ref 0.20–1.20)
Total Protein: 7.5 g/dL (ref 6.4–8.3)

## 2012-03-19 LAB — FERRITIN: Ferritin: 27 ng/mL (ref 22–322)

## 2012-03-19 LAB — LACTATE DEHYDROGENASE (CC13): LDH: 178 U/L (ref 125–245)

## 2012-03-19 LAB — IRON AND TIBC: %SAT: 13 % — ABNORMAL LOW (ref 20–55)

## 2012-03-19 NOTE — Progress Notes (Signed)
This office note has been dictated.  #[id number]

## 2012-03-20 ENCOUNTER — Telehealth: Payer: Self-pay | Admitting: Oncology
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
## 2012-03-20 NOTE — Telephone Encounter (Signed)
lmonvm adviisng the pt of his next lab appt in April and for him to pick up the appt schedules for June and aug 2014

## 2012-03-26 ENCOUNTER — Ambulatory Visit (INDEPENDENT_AMBULATORY_CARE_PROVIDER_SITE_OTHER): Payer: Medicare Other | Admitting: *Deleted

## 2012-03-26 ENCOUNTER — Other Ambulatory Visit: Payer: Self-pay | Admitting: Internal Medicine

## 2012-03-26 ENCOUNTER — Encounter: Payer: Self-pay | Admitting: Internal Medicine

## 2012-03-26 DIAGNOSIS — Z95810 Presence of automatic (implantable) cardiac defibrillator: Secondary | ICD-10-CM | POA: Diagnosis not present

## 2012-03-26 DIAGNOSIS — I5022 Chronic systolic (congestive) heart failure: Secondary | ICD-10-CM

## 2012-03-26 DIAGNOSIS — I472 Ventricular tachycardia: Secondary | ICD-10-CM

## 2012-03-26 DIAGNOSIS — I4729 Other ventricular tachycardia: Secondary | ICD-10-CM

## 2012-03-30 ENCOUNTER — Ambulatory Visit: Payer: Medicare Other

## 2012-03-30 ENCOUNTER — Other Ambulatory Visit: Payer: Medicare Other | Admitting: Lab

## 2012-03-30 ENCOUNTER — Ambulatory Visit: Payer: Medicare Other | Admitting: Oncology

## 2012-04-03 LAB — REMOTE ICD DEVICE
AL IMPEDENCE ICD: 475 Ohm
BATTERY VOLTAGE: 3.15 V
BRDY-0004RV: 120 {beats}/min
CHARGE TIME: 8.778 s
DEV-0020ICD: NEGATIVE
HV IMPEDENCE: 43.53 Ohm
PACEART VT: 0
RV LEAD THRESHOLD: 1.375 V
TOT-0001: 1
TOT-0002: 0
TOT-0006: 20130419000000
TZAT-0001ATACH: 2
TZAT-0001SLOWVT: 1
TZAT-0001SLOWVT: 2
TZAT-0002ATACH: NEGATIVE
TZAT-0002ATACH: NEGATIVE
TZAT-0002FASTVT: NEGATIVE
TZAT-0004SLOWVT: 8
TZAT-0005SLOWVT: 88 pct
TZAT-0005SLOWVT: 91 pct
TZAT-0018ATACH: NEGATIVE
TZAT-0018ATACH: NEGATIVE
TZAT-0018FASTVT: NEGATIVE
TZAT-0018SLOWVT: NEGATIVE
TZAT-0018SLOWVT: NEGATIVE
TZAT-0019ATACH: 6 V
TZAT-0019ATACH: 6 V
TZAT-0019FASTVT: 8 V
TZAT-0020FASTVT: 1.5 ms
TZON-0004VSLOWVT: 20
TZON-0005SLOWVT: 12
TZST-0001ATACH: 5
TZST-0001FASTVT: 5
TZST-0001FASTVT: 6
TZST-0001SLOWVT: 3
TZST-0001SLOWVT: 6
TZST-0002ATACH: NEGATIVE
TZST-0002FASTVT: NEGATIVE
TZST-0002FASTVT: NEGATIVE
TZST-0002FASTVT: NEGATIVE
TZST-0003SLOWVT: 20 J
TZST-0003SLOWVT: 35 J
VENTRICULAR PACING ICD: 19.63 pct

## 2012-04-11 ENCOUNTER — Encounter: Payer: Self-pay | Admitting: *Deleted

## 2012-04-16 ENCOUNTER — Encounter: Payer: Self-pay | Admitting: Cardiology

## 2012-04-16 ENCOUNTER — Ambulatory Visit (INDEPENDENT_AMBULATORY_CARE_PROVIDER_SITE_OTHER): Payer: Medicare Other | Admitting: Cardiology

## 2012-04-16 VITALS — BP 101/69 | HR 58 | Ht 72.0 in | Wt 181.4 lb

## 2012-04-16 DIAGNOSIS — I4891 Unspecified atrial fibrillation: Secondary | ICD-10-CM

## 2012-04-16 DIAGNOSIS — I472 Ventricular tachycardia, unspecified: Secondary | ICD-10-CM

## 2012-04-16 DIAGNOSIS — I2510 Atherosclerotic heart disease of native coronary artery without angina pectoris: Secondary | ICD-10-CM | POA: Diagnosis not present

## 2012-04-16 DIAGNOSIS — I5022 Chronic systolic (congestive) heart failure: Secondary | ICD-10-CM | POA: Diagnosis not present

## 2012-04-16 DIAGNOSIS — Z7901 Long term (current) use of anticoagulants: Secondary | ICD-10-CM

## 2012-04-16 DIAGNOSIS — I482 Chronic atrial fibrillation, unspecified: Secondary | ICD-10-CM

## 2012-04-16 NOTE — Patient Instructions (Signed)
Continue your current therapy   I will see you in 3 months. 
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
## 2012-04-30 DIAGNOSIS — M1990 Unspecified osteoarthritis, unspecified site: Secondary | ICD-10-CM | POA: Diagnosis not present

## 2012-04-30 DIAGNOSIS — N182 Chronic kidney disease, stage 2 (mild): Secondary | ICD-10-CM | POA: Diagnosis not present

## 2012-04-30 DIAGNOSIS — R269 Unspecified abnormalities of gait and mobility: Secondary | ICD-10-CM | POA: Diagnosis not present

## 2012-04-30 DIAGNOSIS — J449 Chronic obstructive pulmonary disease, unspecified: Secondary | ICD-10-CM | POA: Diagnosis not present

## 2012-04-30 DIAGNOSIS — I509 Heart failure, unspecified: Secondary | ICD-10-CM | POA: Diagnosis not present

## 2012-04-30 DIAGNOSIS — I4891 Unspecified atrial fibrillation: Secondary | ICD-10-CM | POA: Diagnosis not present

## 2012-04-30 DIAGNOSIS — E119 Type 2 diabetes mellitus without complications: Secondary | ICD-10-CM | POA: Diagnosis not present

## 2012-05-11 NOTE — Telephone Encounter (Signed)
Closing encounter/kwm

## 2012-05-17 ENCOUNTER — Other Ambulatory Visit (HOSPITAL_BASED_OUTPATIENT_CLINIC_OR_DEPARTMENT_OTHER): Payer: Medicare Other

## 2012-05-17 DIAGNOSIS — D649 Anemia, unspecified: Secondary | ICD-10-CM

## 2012-05-17 DIAGNOSIS — D509 Iron deficiency anemia, unspecified: Secondary | ICD-10-CM

## 2012-05-17 LAB — CBC WITH DIFFERENTIAL/PLATELET
BASO%: 0.7 % (ref 0.0–2.0)
HCT: 39.1 % (ref 38.4–49.9)
HGB: 13 g/dL (ref 13.0–17.1)
MCHC: 33.3 g/dL (ref 32.0–36.0)
MONO#: 1.5 10*3/uL — ABNORMAL HIGH (ref 0.1–0.9)
NEUT%: 54.1 % (ref 39.0–75.0)
WBC: 7.7 10*3/uL (ref 4.0–10.3)
lymph#: 1.9 10*3/uL (ref 0.9–3.3)

## 2012-05-18 ENCOUNTER — Telehealth: Payer: Self-pay | Admitting: *Deleted

## 2012-05-18 NOTE — Telephone Encounter (Signed)
sw pt informed pt that DSM will be out of the office on 09/17/12. gv appt d/t for 8/22. Pt is aware that i will mail him a letter/cal at his request...td

## 2012-06-11 ENCOUNTER — Telehealth: Payer: Self-pay | Admitting: Internal Medicine

## 2012-06-26 ENCOUNTER — Ambulatory Visit (INDEPENDENT_AMBULATORY_CARE_PROVIDER_SITE_OTHER): Payer: Medicare Other | Admitting: *Deleted

## 2012-06-26 DIAGNOSIS — I472 Ventricular tachycardia, unspecified: Secondary | ICD-10-CM

## 2012-06-26 DIAGNOSIS — I5023 Acute on chronic systolic (congestive) heart failure: Secondary | ICD-10-CM | POA: Diagnosis not present

## 2012-06-26 DIAGNOSIS — Z95810 Presence of automatic (implantable) cardiac defibrillator: Secondary | ICD-10-CM | POA: Diagnosis not present

## 2012-06-26 DIAGNOSIS — I509 Heart failure, unspecified: Secondary | ICD-10-CM

## 2012-06-26 DIAGNOSIS — I4729 Other ventricular tachycardia: Secondary | ICD-10-CM

## 2012-06-27 ENCOUNTER — Encounter: Payer: Self-pay | Admitting: Internal Medicine

## 2012-07-02 LAB — REMOTE ICD DEVICE
AL AMPLITUDE: 0.6 mv
ATRIAL PACING ICD: 0.21 pct
BAMS-0001: 170 {beats}/min
BATTERY VOLTAGE: 3.1227 V
BRDY-0002RV: 55 {beats}/min
FVT: 0
RV LEAD AMPLITUDE: 11.1 mv
RV LEAD IMPEDENCE ICD: 551 Ohm
RV LEAD THRESHOLD: 1.125 V
TZAT-0001ATACH: 1
TZAT-0002ATACH: NEGATIVE
TZAT-0002ATACH: NEGATIVE
TZAT-0005SLOWVT: 88 pct
TZAT-0005SLOWVT: 91 pct
TZAT-0011SLOWVT: 10 ms
TZAT-0011SLOWVT: 10 ms
TZAT-0012ATACH: 150 ms
TZAT-0012SLOWVT: 170 ms
TZAT-0012SLOWVT: 170 ms
TZAT-0019ATACH: 6 V
TZAT-0019ATACH: 6 V
TZAT-0019ATACH: 6 V
TZAT-0019FASTVT: 8 V
TZAT-0019SLOWVT: 8 V
TZAT-0019SLOWVT: 8 V
TZAT-0020ATACH: 1.5 ms
TZAT-0020ATACH: 1.5 ms
TZAT-0020FASTVT: 1.5 ms
TZON-0003ATACH: 350 ms
TZON-0003SLOWVT: 330 ms
TZON-0005SLOWVT: 12
TZST-0001ATACH: 4
TZST-0001ATACH: 5
TZST-0001FASTVT: 2
TZST-0001FASTVT: 3
TZST-0001SLOWVT: 4
TZST-0001SLOWVT: 6
TZST-0002FASTVT: NEGATIVE
TZST-0002FASTVT: NEGATIVE
TZST-0003SLOWVT: 20 J
TZST-0003SLOWVT: 35 J
TZST-0003SLOWVT: 35 J
VF: 0

## 2012-07-17 ENCOUNTER — Other Ambulatory Visit: Payer: Medicare Other | Admitting: Lab

## 2012-07-17 ENCOUNTER — Other Ambulatory Visit: Payer: Self-pay | Admitting: Medical Oncology

## 2012-07-17 ENCOUNTER — Ambulatory Visit (INDEPENDENT_AMBULATORY_CARE_PROVIDER_SITE_OTHER): Payer: Medicare Other | Admitting: Cardiology

## 2012-07-17 ENCOUNTER — Encounter: Payer: Self-pay | Admitting: Medical Oncology

## 2012-07-17 ENCOUNTER — Encounter: Payer: Self-pay | Admitting: Cardiology

## 2012-07-17 VITALS — BP 102/62 | HR 78 | Ht 72.0 in | Wt 189.0 lb

## 2012-07-17 DIAGNOSIS — I2510 Atherosclerotic heart disease of native coronary artery without angina pectoris: Secondary | ICD-10-CM

## 2012-07-17 DIAGNOSIS — I472 Ventricular tachycardia, unspecified: Secondary | ICD-10-CM

## 2012-07-17 DIAGNOSIS — I5022 Chronic systolic (congestive) heart failure: Secondary | ICD-10-CM | POA: Diagnosis not present

## 2012-07-17 DIAGNOSIS — I4891 Unspecified atrial fibrillation: Secondary | ICD-10-CM

## 2012-07-17 DIAGNOSIS — I482 Chronic atrial fibrillation, unspecified: Secondary | ICD-10-CM

## 2012-07-17 NOTE — Patient Instructions (Addendum)
Stay ahead on your weight. You may take an extra Lasix if needed.  I will see you in 3 months.

## 2012-07-18 ENCOUNTER — Other Ambulatory Visit (HOSPITAL_BASED_OUTPATIENT_CLINIC_OR_DEPARTMENT_OTHER): Payer: Medicare Other | Admitting: Lab

## 2012-07-18 ENCOUNTER — Encounter: Payer: Self-pay | Admitting: Cardiology

## 2012-07-18 DIAGNOSIS — D509 Iron deficiency anemia, unspecified: Secondary | ICD-10-CM

## 2012-07-18 DIAGNOSIS — I2510 Atherosclerotic heart disease of native coronary artery without angina pectoris: Secondary | ICD-10-CM | POA: Diagnosis not present

## 2012-07-18 DIAGNOSIS — I5022 Chronic systolic (congestive) heart failure: Secondary | ICD-10-CM | POA: Diagnosis not present

## 2012-07-18 DIAGNOSIS — D649 Anemia, unspecified: Secondary | ICD-10-CM | POA: Diagnosis not present

## 2012-07-18 DIAGNOSIS — I472 Ventricular tachycardia: Secondary | ICD-10-CM | POA: Diagnosis not present

## 2012-07-18 DIAGNOSIS — I4891 Unspecified atrial fibrillation: Secondary | ICD-10-CM | POA: Diagnosis not present

## 2012-07-18 LAB — CBC WITH DIFFERENTIAL/PLATELET
BASO%: 0.8 % (ref 0.0–2.0)
EOS%: 1.4 % (ref 0.0–7.0)
LYMPH%: 24.7 % (ref 14.0–49.0)
MCH: 29.7 pg (ref 27.2–33.4)
MCHC: 34.3 g/dL (ref 32.0–36.0)
MONO#: 1.4 10*3/uL — ABNORMAL HIGH (ref 0.1–0.9)
MONO%: 21.4 % — ABNORMAL HIGH (ref 0.0–14.0)
Platelets: 157 10*3/uL (ref 140–400)
RBC: 4.55 10*6/uL (ref 4.20–5.82)
WBC: 6.6 10*3/uL (ref 4.0–10.3)

## 2012-07-18 LAB — FERRITIN: Ferritin: 30 ng/mL (ref 22–322)
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
## 2012-08-06 ENCOUNTER — Telehealth: Payer: Self-pay | Admitting: *Deleted

## 2012-08-06 ENCOUNTER — Encounter: Payer: Self-pay | Admitting: Internal Medicine

## 2012-08-06 NOTE — Telephone Encounter (Signed)
Spoke to [employer] he stated he got a call from [name].[name] stated he thought his ICD went off this past Saturday night.Charles checked his ICD and it did not discharge.Leonette Most stated [name] had symptoms of a possible stroke.Spoke to [name] he stated about 11:45 pm this past Saturday night 08/04/12 he had a shocking sensation all over his body notice in head particulary.Stated this was a different shocking that he has had before when his ICD has shocked him.Stated he really felt shock in his head.Stated top of rt arm felt a little numb lasting appox 2 to 3 hours.No slurred speech.Weakness noticed more in his legs.Stated has had a headache since this happened.[name] advised next time this happens he should go to ER or call Dr.on call.Message sent to Dr.Jordan for review.

## 2012-08-08 ENCOUNTER — Telehealth: Payer: Self-pay | Admitting: Internal Medicine

## 2012-08-08 NOTE — Telephone Encounter (Signed)
Pt called to make sure he still needs to come for an appointment with Dr. [name] on 02/16/12 at 10:00 AM. Pt states since he had the very large defibrillator shock  this past Saturday; He has been feeling fine, except for mild SOB. Pt states will keep his appointment. Pt is aware to call the office if needed.

## 2012-08-08 NOTE — Telephone Encounter (Signed)
New Prob    Pt would like to speak to nurse regarding some symptoms he has been experiencing. Pt did not leave much information.

## 2012-08-10 ENCOUNTER — Encounter: Payer: Self-pay | Admitting: *Deleted

## 2012-08-15 ENCOUNTER — Ambulatory Visit (INDEPENDENT_AMBULATORY_CARE_PROVIDER_SITE_OTHER): Payer: Medicare Other | Admitting: Internal Medicine

## 2012-08-15 ENCOUNTER — Encounter: Payer: Self-pay | Admitting: Internal Medicine

## 2012-08-15 VITALS — BP 112/70 | HR 77 | Wt 190.0 lb

## 2012-08-15 DIAGNOSIS — I4891 Unspecified atrial fibrillation: Secondary | ICD-10-CM

## 2012-08-15 DIAGNOSIS — I5022 Chronic systolic (congestive) heart failure: Secondary | ICD-10-CM

## 2012-08-15 DIAGNOSIS — I472 Ventricular tachycardia: Secondary | ICD-10-CM

## 2012-08-15 DIAGNOSIS — N182 Chronic kidney disease, stage 2 (mild): Secondary | ICD-10-CM | POA: Diagnosis not present

## 2012-08-15 DIAGNOSIS — I2589 Other forms of chronic ischemic heart disease: Secondary | ICD-10-CM | POA: Diagnosis not present

## 2012-08-15 DIAGNOSIS — Z95810 Presence of automatic (implantable) cardiac defibrillator: Secondary | ICD-10-CM

## 2012-08-15 DIAGNOSIS — Z1331 Encounter for screening for depression: Secondary | ICD-10-CM | POA: Diagnosis not present

## 2012-08-15 DIAGNOSIS — I482 Chronic atrial fibrillation, unspecified: Secondary | ICD-10-CM

## 2012-08-15 DIAGNOSIS — I6200 Nontraumatic subdural hemorrhage, unspecified: Secondary | ICD-10-CM | POA: Diagnosis not present

## 2012-08-15 DIAGNOSIS — R209 Unspecified disturbances of skin sensation: Secondary | ICD-10-CM | POA: Diagnosis not present

## 2012-08-15 DIAGNOSIS — Z6826 Body mass index (BMI) 26.0-26.9, adult: Secondary | ICD-10-CM | POA: Diagnosis not present

## 2012-08-15 LAB — ICD DEVICE OBSERVATION
AL IMPEDENCE ICD: 475 Ohm
BAMS-0001: 170 {beats}/min
BATTERY VOLTAGE: 3.1159 V
CHARGE TIME: 9.188 s
RV LEAD AMPLITUDE: 14.75 mv
RV LEAD IMPEDENCE ICD: 551 Ohm
RV LEAD THRESHOLD: 1 V
TOT-0001: 1
TOT-0002: 0
TOT-0006: 20130419000000
TZAT-0001ATACH: 2
TZAT-0001ATACH: 3
TZAT-0001FASTVT: 1
TZAT-0001SLOWVT: 1
TZAT-0001SLOWVT: 2
TZAT-0002ATACH: NEGATIVE
TZAT-0002ATACH: NEGATIVE
TZAT-0002FASTVT: NEGATIVE
TZAT-0004SLOWVT: 8
TZAT-0004SLOWVT: 8
TZAT-0005SLOWVT: 88 pct
TZAT-0005SLOWVT: 91 pct
TZAT-0012ATACH: 150 ms
TZAT-0012ATACH: 150 ms
TZAT-0012ATACH: 150 ms
TZAT-0012FASTVT: 170 ms
TZAT-0018ATACH: NEGATIVE
TZAT-0018ATACH: NEGATIVE
TZAT-0018FASTVT: NEGATIVE
TZAT-0019ATACH: 6 V
TZAT-0019FASTVT: 8 V
TZAT-0020ATACH: 1.5 ms
TZAT-0020SLOWVT: 1.5 ms
TZAT-0020SLOWVT: 1.5 ms
TZON-0003ATACH: 350 ms
TZON-0003VSLOWVT: 450 ms
TZON-0004SLOWVT: 28
TZON-0004VSLOWVT: 20
TZST-0001ATACH: 6
TZST-0001FASTVT: 2
TZST-0001FASTVT: 4
TZST-0001FASTVT: 6
TZST-0001SLOWVT: 3
TZST-0001SLOWVT: 5
TZST-0001SLOWVT: 6
TZST-0002ATACH: NEGATIVE
TZST-0002FASTVT: NEGATIVE
TZST-0002FASTVT: NEGATIVE
TZST-0002FASTVT: NEGATIVE
TZST-0002FASTVT: NEGATIVE
TZST-0003SLOWVT: 20 J
TZST-0003SLOWVT: 35 J
TZST-0003SLOWVT: 35 J
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
## 2012-08-15 NOTE — Assessment & Plan Note (Signed)
His heart failure symptoms are class II. He'll continue his current medical therapy, and maintain a low-sodium diet.

## 2012-08-15 NOTE — Patient Instructions (Signed)
Your physician wants you to follow-up in: 12 months with Dr Taylor You will receive a reminder letter in the mail two months in advance. If you don't receive a letter, please call our office to schedule the follow-up appointment.  Remote monitoring is used to monitor your Pacemaker of ICD from home. This monitoring reduces the number of office visits required to check your device to one time per year. It allows us to keep an eye on the functioning of your device to ensure it is working properly. You are scheduled for a device check from home on 11/19/12. You may send your transmission at any time that day. If you have a wireless device, the transmission will be sent automatically. After your physician reviews your transmission, you will receive a postcard with your next transmission date.     

## 2012-08-15 NOTE — Assessment & Plan Note (Signed)
He has had no recurrent sustained ventricular arrhythmias. He will continue amiodarone.

## 2012-08-15 NOTE — Assessment & Plan Note (Signed)
His Medtronic ICD is working normally. We'll recheck in several months. No ventricular arrhythmias.

## 2012-08-16 ENCOUNTER — Other Ambulatory Visit: Payer: Medicare Other

## 2012-08-16 ENCOUNTER — Other Ambulatory Visit: Payer: Self-pay | Admitting: Internal Medicine

## 2012-08-16 ENCOUNTER — Ambulatory Visit
Admission: RE | Admit: 2012-08-16 | Discharge: 2012-08-16 | Disposition: A | Discharge status: Home / Self Care | Payer: Medicare Other | Source: Ambulatory Visit | Attending: Internal Medicine | Admitting: Internal Medicine

## 2012-08-16 DIAGNOSIS — R209 Unspecified disturbances of skin sensation: Secondary | ICD-10-CM

## 2012-08-16 DIAGNOSIS — R200 Anesthesia of skin: Secondary | ICD-10-CM

## 2012-08-16 DIAGNOSIS — R51 Headache: Secondary | ICD-10-CM | POA: Diagnosis not present

## 2012-08-17 ENCOUNTER — Encounter: Payer: Self-pay | Admitting: Internal Medicine

## 2012-08-17 DIAGNOSIS — L738 Other specified follicular disorders: Secondary | ICD-10-CM | POA: Diagnosis not present

## 2012-08-17 DIAGNOSIS — D692 Other nonthrombocytopenic purpura: Secondary | ICD-10-CM | POA: Diagnosis not present

## 2012-08-29 ENCOUNTER — Telehealth: Payer: Self-pay | Admitting: Cardiology

## 2012-08-29 MED ORDER — ROSUVASTATIN CALCIUM 5 MG PO TABS: 5.0000 mg | ORAL_TABLET | ORAL | Status: DC

## 2012-08-29 MED ORDER — CARVEDILOL 12.5 MG PO TABS: 12.5000 mg | ORAL_TABLET | Freq: Two times a day (BID) | ORAL | Status: DC

## 2012-08-29 NOTE — Telephone Encounter (Signed)
Returned call to patient he stated he needed a 90 day refill on carvedilol sent to [facility] on [address].

## 2012-08-29 NOTE — Telephone Encounter (Signed)
New prob  Pt states he needs to speak with you regarding a prescription. He said he is changing from [facility] to [facility].

## 2012-08-29 NOTE — Telephone Encounter (Signed)
rosuvastatin (CRESTOR) 5 MG tablet  Take 5 mg by mouth once a week. Sunday

## 2012-09-03 ENCOUNTER — Telehealth: Payer: Self-pay | Admitting: Cardiology

## 2012-09-03 MED ORDER — AMIODARONE HCL 200 MG PO TABS: 200.0000 mg | ORAL_TABLET | Freq: Every day | ORAL | Status: DC

## 2012-09-03 NOTE — Telephone Encounter (Signed)
New problem   Pt needs to discuss a prescription that he's having a problem with

## 2012-09-03 NOTE — Telephone Encounter (Signed)
Returned call to patient he stated he needed a refill on pacerone.Refill sent to [facility].

## 2012-09-09 ENCOUNTER — Encounter (HOSPITAL_COMMUNITY): Payer: Self-pay

## 2012-09-09 ENCOUNTER — Emergency Department (HOSPITAL_COMMUNITY): Payer: Medicare Other

## 2012-09-09 ENCOUNTER — Observation Stay (HOSPITAL_COMMUNITY)
Admission: EM | Admit: 2012-09-09 | Discharge: 2012-09-10 | Disposition: A | Discharge status: Home / Self Care | Payer: Medicare Other | Attending: Internal Medicine | Admitting: Internal Medicine

## 2012-09-09 DIAGNOSIS — I5022 Chronic systolic (congestive) heart failure: Secondary | ICD-10-CM | POA: Diagnosis not present

## 2012-09-09 DIAGNOSIS — R601 Generalized edema: Secondary | ICD-10-CM

## 2012-09-09 DIAGNOSIS — R404 Transient alteration of awareness: Secondary | ICD-10-CM | POA: Diagnosis not present

## 2012-09-09 DIAGNOSIS — I482 Chronic atrial fibrillation, unspecified: Secondary | ICD-10-CM | POA: Diagnosis present

## 2012-09-09 DIAGNOSIS — R42 Dizziness and giddiness: Secondary | ICD-10-CM | POA: Diagnosis not present

## 2012-09-09 DIAGNOSIS — I4729 Other ventricular tachycardia: Secondary | ICD-10-CM | POA: Insufficient documentation

## 2012-09-09 DIAGNOSIS — I472 Ventricular tachycardia, unspecified: Secondary | ICD-10-CM | POA: Insufficient documentation

## 2012-09-09 DIAGNOSIS — Z79899 Other long term (current) drug therapy: Secondary | ICD-10-CM | POA: Insufficient documentation

## 2012-09-09 DIAGNOSIS — I509 Heart failure, unspecified: Secondary | ICD-10-CM

## 2012-09-09 DIAGNOSIS — R609 Edema, unspecified: Secondary | ICD-10-CM | POA: Diagnosis not present

## 2012-09-09 DIAGNOSIS — R110 Nausea: Secondary | ICD-10-CM | POA: Diagnosis not present

## 2012-09-09 DIAGNOSIS — I5023 Acute on chronic systolic (congestive) heart failure: Secondary | ICD-10-CM | POA: Diagnosis not present

## 2012-09-09 DIAGNOSIS — R5383 Other fatigue: Secondary | ICD-10-CM | POA: Diagnosis not present

## 2012-09-09 DIAGNOSIS — Z95810 Presence of automatic (implantable) cardiac defibrillator: Secondary | ICD-10-CM | POA: Insufficient documentation

## 2012-09-09 DIAGNOSIS — I4891 Unspecified atrial fibrillation: Secondary | ICD-10-CM | POA: Insufficient documentation

## 2012-09-09 LAB — BASIC METABOLIC PANEL
CO2: 27 mEq/L (ref 19–32)
Chloride: 101 mEq/L (ref 96–112)
Creatinine, Ser: 1.48 mg/dL — ABNORMAL HIGH (ref 0.50–1.35)
GFR calc Af Amer: 51 mL/min — ABNORMAL LOW (ref >90)
Potassium: 4.2 mEq/L (ref 3.5–5.1)

## 2012-09-09 LAB — POCT I-STAT TROPONIN I

## 2012-09-09 LAB — CBC WITH DIFFERENTIAL/PLATELET
Basophils Absolute: 0 10*3/uL (ref 0.0–0.1)
Basophils Relative: 0 % (ref 0–1)
HCT: 40.8 % (ref 39.0–52.0)
Hemoglobin: 13.7 g/dL (ref 13.0–17.0)
Lymphocytes Relative: 20 % (ref 12–46)
Monocytes Absolute: 1.5 10*3/uL — ABNORMAL HIGH (ref 0.1–1.0)
Neutro Abs: 4.1 10*3/uL (ref 1.7–7.7)
Neutrophils Relative %: 58 % (ref 43–77)
RDW: 15.5 % (ref 11.5–15.5)
WBC: 7.1 10*3/uL (ref 4.0–10.5)

## 2012-09-09 LAB — URINALYSIS, ROUTINE W REFLEX MICROSCOPIC
Glucose, UA: NEGATIVE mg/dL
Ketones, ur: NEGATIVE mg/dL
Leukocytes, UA: NEGATIVE
Nitrite: NEGATIVE
Specific Gravity, Urine: 1.01 (ref 1.005–1.030)
pH: 5.5 (ref 5.0–8.0)

## 2012-09-09 LAB — TROPONIN I: Troponin I: <0.3 ng/mL (ref <0.30)

## 2012-09-09 LAB — LACTIC ACID, PLASMA: Lactic Acid, Venous: 1 mmol/L (ref 0.5–2.2)

## 2012-09-09 MED ORDER — FUROSEMIDE 40 MG PO TABS
40.0000 mg | ORAL_TABLET | Freq: Every day | ORAL | Status: DC
  Administered 2012-09-10: 40 mg via ORAL
  Filled 2012-09-09 (×2): qty 1

## 2012-09-09 MED ORDER — ONDANSETRON HCL 4 MG/2ML IJ SOLN
4.0000 mg | Freq: Once | INTRAMUSCULAR | Status: AC
  Administered 2012-09-09: 4 mg via INTRAVENOUS
  Filled 2012-09-09: qty 2

## 2012-09-09 MED ORDER — ATORVASTATIN CALCIUM 20 MG PO TABS
20.0000 mg | ORAL_TABLET | Freq: Every day | ORAL | Status: DC
  Administered 2012-09-10: 20 mg via ORAL
  Filled 2012-09-09: qty 1

## 2012-09-09 MED ORDER — LORATADINE 10 MG PO TABS
10.0000 mg | ORAL_TABLET | Freq: Every day | ORAL | Status: DC
  Administered 2012-09-10: 10 mg via ORAL
  Filled 2012-09-09: qty 1

## 2012-09-09 MED ORDER — MECLIZINE HCL 25 MG PO TABS
25.0000 mg | ORAL_TABLET | Freq: Three times a day (TID) | ORAL | Status: DC | PRN
  Filled 2012-09-09: qty 1

## 2012-09-09 MED ORDER — MOMETASONE FURO-FORMOTEROL FUM 100-5 MCG/ACT IN AERO
2.0000 | INHALATION_SPRAY | Freq: Two times a day (BID) | RESPIRATORY_TRACT | Status: DC
  Administered 2012-09-09: 2 via RESPIRATORY_TRACT
  Filled 2012-09-09: qty 8.8

## 2012-09-09 MED ORDER — OMEGA-3-ACID ETHYL ESTERS 1 G PO CAPS
1.0000 g | ORAL_CAPSULE | Freq: Two times a day (BID) | ORAL | Status: DC
  Administered 2012-09-09 – 2012-09-10 (×2): 1 g via ORAL
  Filled 2012-09-09 (×4): qty 1

## 2012-09-09 MED ORDER — POLYVINYL ALCOHOL 1.4 % OP SOLN: 1.0000 [drp] | Freq: Three times a day (TID) | OPHTHALMIC | Status: DC | PRN

## 2012-09-09 MED ORDER — TRAMADOL HCL 50 MG PO TABS
25.0000 mg | ORAL_TABLET | Freq: Four times a day (QID) | ORAL | Status: DC | PRN
  Administered 2012-09-10: 25 mg via ORAL
  Filled 2012-09-09: qty 1

## 2012-09-09 MED ORDER — POTASSIUM CHLORIDE CRYS ER 10 MEQ PO TBCR
10.0000 meq | EXTENDED_RELEASE_TABLET | Freq: Every day | ORAL | Status: DC
  Administered 2012-09-10: 10 meq via ORAL
  Filled 2012-09-09 (×2): qty 1

## 2012-09-09 MED ORDER — CARBOXYMETHYLCELLULOSE SODIUM 1 % OP SOLN: 1.0000 [drp] | Freq: Three times a day (TID) | OPHTHALMIC | Status: DC | PRN

## 2012-09-09 MED ORDER — FAMOTIDINE 20 MG PO TABS
20.0000 mg | ORAL_TABLET | Freq: Every day | ORAL | Status: DC
  Administered 2012-09-09 – 2012-09-10 (×2): 20 mg via ORAL
  Filled 2012-09-09 (×2): qty 1

## 2012-09-09 MED ORDER — MECLIZINE HCL 25 MG PO TABS
25.0000 mg | ORAL_TABLET | Freq: Once | ORAL | Status: AC
  Administered 2012-09-09: 25 mg via ORAL
  Filled 2012-09-09: qty 1

## 2012-09-09 MED ORDER — CARVEDILOL 12.5 MG PO TABS
12.5000 mg | ORAL_TABLET | Freq: Two times a day (BID) | ORAL | Status: DC
  Administered 2012-09-09 – 2012-09-10 (×3): 12.5 mg via ORAL
  Filled 2012-09-09 (×4): qty 1

## 2012-09-09 MED ORDER — AMIODARONE HCL 200 MG PO TABS
200.0000 mg | ORAL_TABLET | Freq: Every day | ORAL | Status: DC
  Administered 2012-09-10: 200 mg via ORAL
  Filled 2012-09-09: qty 1

## 2012-09-09 MED ORDER — SODIUM CHLORIDE 0.9 % IV BOLUS (SEPSIS)
500.0000 mL | Freq: Once | INTRAVENOUS | Status: AC
  Administered 2012-09-09: 500 mL via INTRAVENOUS

## 2012-09-09 MED ORDER — CYCLOSPORINE 0.05 % OP EMUL
1.0000 [drp] | Freq: Two times a day (BID) | OPHTHALMIC | Status: DC
  Administered 2012-09-09 – 2012-09-10 (×2): 1 [drp] via OPHTHALMIC
  Filled 2012-09-09 (×4): qty 1

## 2012-09-09 MED ORDER — SODIUM CHLORIDE 0.9 % IJ SOLN
3.0000 mL | Freq: Two times a day (BID) | INTRAMUSCULAR | Status: DC
  Administered 2012-09-09 – 2012-09-10 (×2): 3 mL via INTRAVENOUS

## 2012-09-09 MED ORDER — LISINOPRIL 10 MG PO TABS
10.0000 mg | ORAL_TABLET | Freq: Every day | ORAL | Status: DC
  Administered 2012-09-10: 10 mg via ORAL
  Filled 2012-09-09 (×2): qty 1

## 2012-09-09 MED ORDER — NITROGLYCERIN 0.4 MG SL SUBL: 0.4000 mg | SUBLINGUAL_TABLET | SUBLINGUAL | Status: DC | PRN

## 2012-09-09 MED ORDER — POLYETHYLENE GLYCOL 3350 17 G PO PACK
17.0000 g | PACK | Freq: Two times a day (BID) | ORAL | Status: DC | PRN
  Administered 2012-09-10: 17 g via ORAL
  Filled 2012-09-09: qty 1

## 2012-09-09 MED ORDER — MORPHINE SULFATE 4 MG/ML IJ SOLN
4.0000 mg | Freq: Once | INTRAMUSCULAR | Status: AC
  Administered 2012-09-09: 4 mg via INTRAVENOUS
  Filled 2012-09-09: qty 1

## 2012-09-09 NOTE — ED Notes (Signed)
Pt. Denies any headaches,.

## 2012-09-09 NOTE — ED Notes (Addendum)
Pt. Reports having dizziness nausea and felt that his BP was elevated.   Neuro negative.  No slurred speech, weakness or facial drooping. Pt. Did receive 4mg  I v Zofran en - route
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
## 2012-09-09 NOTE — ED Provider Notes (Signed)
Medical screening examination/treatment/procedure(s) were conducted as a shared visit with non-physician practitioner(s) and myself.  I personally evaluated the patient during the encounter   Date: 09/09/2012  Rate: 77  Rhythm: atrial fibrillation  QRS Axis: normal  Intervals: indeterminate  ST/T Wave abnormalities: normal  Conduction Disutrbances:none  Narrative Interpretation: No ST or T wave changes cw ischemia  Old EKG Reviewed: changes noted    Junius Argyle, MD 09/09/12 [id number]

## 2012-09-10 ENCOUNTER — Observation Stay (HOSPITAL_COMMUNITY): Payer: Medicare Other

## 2012-09-10 DIAGNOSIS — R51 Headache: Secondary | ICD-10-CM | POA: Diagnosis not present

## 2012-09-10 DIAGNOSIS — Z95810 Presence of automatic (implantable) cardiac defibrillator: Secondary | ICD-10-CM | POA: Diagnosis not present

## 2012-09-10 DIAGNOSIS — G459 Transient cerebral ischemic attack, unspecified: Secondary | ICD-10-CM | POA: Diagnosis not present

## 2012-09-10 DIAGNOSIS — I4891 Unspecified atrial fibrillation: Secondary | ICD-10-CM | POA: Diagnosis not present

## 2012-09-10 DIAGNOSIS — I5023 Acute on chronic systolic (congestive) heart failure: Secondary | ICD-10-CM | POA: Diagnosis not present

## 2012-09-10 DIAGNOSIS — R42 Dizziness and giddiness: Secondary | ICD-10-CM | POA: Diagnosis not present

## 2012-09-10 LAB — COMPREHENSIVE METABOLIC PANEL
ALT: 19 U/L (ref 0–53)
AST: 28 U/L (ref 0–37)
Alkaline Phosphatase: 91 U/L (ref 39–117)
CO2: 27 mEq/L (ref 19–32)
Calcium: 8.8 mg/dL (ref 8.4–10.5)
GFR calc non Af Amer: 49 mL/min — ABNORMAL LOW (ref >90)
Potassium: 4 mEq/L (ref 3.5–5.1)
Sodium: 141 mEq/L (ref 135–145)

## 2012-09-10 LAB — CBC
HCT: 38.2 % — ABNORMAL LOW (ref 39.0–52.0)
Hemoglobin: 12.8 g/dL — ABNORMAL LOW (ref 13.0–17.0)
MCH: 29.7 pg (ref 26.0–34.0)
MCHC: 33.5 g/dL (ref 30.0–36.0)
RDW: 15.7 % — ABNORMAL HIGH (ref 11.5–15.5)

## 2012-09-10 MED ORDER — MECLIZINE HCL 25 MG PO TABS: 25.0000 mg | ORAL_TABLET | Freq: Three times a day (TID) | ORAL | Status: DC | PRN

## 2012-09-10 NOTE — Evaluation (Signed)
Physical Therapy Evaluation Patient Details Name: [name] MRN: [id number] DOB: 1932/06/27 Today's Date: 09/10/2012 Time: 1010-1040 PT Time Calculation (min): 30 min  PT Assessment / Plan / Recommendation History of Present Illness  79 y/o male admitted with sudden onset of spinning vertigo which by the time hospitalist got to see him had fully resolved.  Clinical Impression  Patient evaluated by Physical Therapy with no further acute PT needs identified. All education has been completed and the patient has no further questions.  See below for any follow-up Physial Therapy or equipment needs. PT is signing off. Thank you for this referral.     PT Assessment  Patient needs continued PT services    Follow Up Recommendations  No PT follow up    Does the patient have the potential to tolerate intense rehabilitation      Barriers to Discharge Decreased caregiver support      Equipment Recommendations  None recommended by PT    Recommendations for Other Services     Frequency      Precautions / Restrictions Precautions Precautions: Fall   Pertinent Vitals/Pain       Mobility  Bed Mobility Bed Mobility: Supine to Sit;Sitting - Scoot to Edge of Bed Supine to Sit: 7: Independent Sitting - Scoot to Edge of Bed: 7: Independent Details for Bed Mobility Assistance: safe mobility Transfers Transfers: Sit to Stand;Stand to Sit Sit to Stand: 6: Modified independent (Device/Increase time);With upper extremity assist;From bed Stand to Sit: 6: Modified independent (Device/Increase time);With upper extremity assist;To chair/3-in-1 Details for Transfer Assistance: initially uses legs against bed for support until ready to move on. Ambulation/Gait Ambulation/Gait Assistance: 6: Modified independent (Device/Increase time) Ambulation Distance (Feet): 500 Feet Assistive device: Straight cane Ambulation/Gait Assistance Details: generally steady.  Guarded with any scanning task.   Stops to turn around and is more unsteady if asked to turn around on the move.  Uses cane well. Gait Pattern: Within Functional Limits Stairs: No    Exercises     PT Diagnosis:    PT Problem List: Decreased balance PT Treatment Interventions:       PT Goals(Current goals can be found in the care plan section)    Visit Information  Last PT Received On: 09/10/12 Assistance Needed: +1 History of Present Illness: 79 y/o male admitted with sudden onset of spinning vertigo which by the time hospitalist got to see him had fully resolved.       Prior Functioning  Home Living Family/patient expects to be discharged to:: Private residence Living Arrangements: Alone Available Help at Discharge: Other (Comment) (Niece lives in [place]) Type of Home: Apartment Entrance Stairs-Number of Steps: none Home Layout: One level Home Equipment: Walker - 4 wheels;Cane - quad;Cane - single point (built in Information systems manager) Prior Function Level of Independence: Independent with assistive device(s) Communication Communication: No difficulties    Cognition  Cognition Arousal/Alertness: Awake/alert Behavior During Therapy: WFL for tasks assessed/performed Overall Cognitive Status: Within Functional Limits for tasks assessed    Extremity/Trunk Assessment Upper Extremity Assessment Upper Extremity Assessment: Overall WFL for tasks assessed;LUE deficits/detail LUE Deficits / Details: gross extension 3/5 o/w WFL Lower Extremity Assessment Lower Extremity Assessment: Overall WFL for tasks assessed (painful R knee with overall R LE weaker than L LE)   Balance    End of Session PT - End of Session Activity Tolerance: Patient tolerated treatment well Patient left: in chair;with call bell/phone within reach;with nursing/sitter in room Nurse Communication: Mobility status  GP Functional  Assessment Tool Used: clinical judgement Functional Limitation: Mobility: Walking and moving around Mobility:  Walking and Moving Around Current Status [phone]): 0 percent impaired, limited or restricted Mobility: Walking and Moving Around Goal Status [phone]): 0 percent impaired, limited or restricted Mobility: Walking and Moving Around Discharge Status [phone]): 0 percent impaired, limited or restricted   Alicia Ackert, Eliseo Gum 09/10/2012, 10:48 AM 09/10/2012  Alderson Bing, PT [phone]  (pager)

## 2012-09-10 NOTE — Progress Notes (Signed)
VASCULAR LAB PRELIMINARY  PRELIMINARY  PRELIMINARY  PRELIMINARY  Carotid duplex completed.    Preliminary report:  Mild plaque disease without significant ICA stenosis bilaterally.  Right vertebral artery not found.  Left vertebral artery flow antegrade.  Daily Crate, RVT 09/10/2012, 4:59 PM

## 2012-09-10 NOTE — Progress Notes (Signed)
*  PRELIMINARY RESULTS* Echocardiogram 2D Echocardiogram has been performed.  [name] 09/10/2012, 9:38 AM
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
## 2012-09-10 NOTE — Progress Notes (Signed)
Lab notified Rn that current MRSA PCR screen is positive. Placed pt on contact precautions per protocol.

## 2012-09-12 ENCOUNTER — Telehealth: Payer: Self-pay | Admitting: Hematology and Oncology

## 2012-09-12 NOTE — Telephone Encounter (Signed)
, °

## 2012-09-17 ENCOUNTER — Other Ambulatory Visit: Payer: Medicare Other | Admitting: Lab

## 2012-09-17 ENCOUNTER — Ambulatory Visit: Payer: Medicare Other | Admitting: Oncology

## 2012-09-17 DIAGNOSIS — E1129 Type 2 diabetes mellitus with other diabetic kidney complication: Secondary | ICD-10-CM | POA: Diagnosis not present

## 2012-09-21 ENCOUNTER — Ambulatory Visit: Payer: Medicare Other

## 2012-09-21 ENCOUNTER — Other Ambulatory Visit: Payer: Medicare Other | Admitting: Lab

## 2012-09-24 DIAGNOSIS — J029 Acute pharyngitis, unspecified: Secondary | ICD-10-CM | POA: Diagnosis not present

## 2012-09-24 DIAGNOSIS — K117 Disturbances of salivary secretion: Secondary | ICD-10-CM | POA: Diagnosis not present

## 2012-09-24 DIAGNOSIS — J449 Chronic obstructive pulmonary disease, unspecified: Secondary | ICD-10-CM | POA: Diagnosis not present

## 2012-09-24 DIAGNOSIS — B370 Candidal stomatitis: Secondary | ICD-10-CM | POA: Diagnosis not present

## 2012-10-03 ENCOUNTER — Other Ambulatory Visit: Payer: Self-pay | Admitting: *Deleted

## 2012-10-03 DIAGNOSIS — D509 Iron deficiency anemia, unspecified: Secondary | ICD-10-CM

## 2012-10-04 ENCOUNTER — Ambulatory Visit (HOSPITAL_BASED_OUTPATIENT_CLINIC_OR_DEPARTMENT_OTHER): Payer: Medicare Other | Admitting: Hematology and Oncology

## 2012-10-04 ENCOUNTER — Other Ambulatory Visit (HOSPITAL_BASED_OUTPATIENT_CLINIC_OR_DEPARTMENT_OTHER): Payer: Medicare Other | Admitting: Lab

## 2012-10-04 VITALS — BP 96/54 | HR 77 | Temp 97.4°F | Resp 17 | Ht 72.0 in | Wt 185.0 lb

## 2012-10-04 DIAGNOSIS — D649 Anemia, unspecified: Secondary | ICD-10-CM

## 2012-10-04 DIAGNOSIS — D509 Iron deficiency anemia, unspecified: Secondary | ICD-10-CM

## 2012-10-04 LAB — CBC WITH DIFFERENTIAL/PLATELET
Basophils Absolute: 0.1 10*3/uL (ref 0.0–0.1)
Eosinophils Absolute: 0.1 10*3/uL (ref 0.0–0.5)
HCT: 39.6 % (ref 38.4–49.9)
HGB: 13.3 g/dL (ref 13.0–17.1)
LYMPH%: 15.3 % (ref 14.0–49.0)
MCV: 89.1 fL (ref 79.3–98.0)
MONO#: 1.3 10*3/uL — ABNORMAL HIGH (ref 0.1–0.9)
MONO%: 13.6 % (ref 0.0–14.0)
NEUT#: 6.8 10*3/uL — ABNORMAL HIGH (ref 1.5–6.5)
NEUT%: 69.5 % (ref 39.0–75.0)
Platelets: 240 10*3/uL (ref 140–400)
WBC: 9.9 10*3/uL (ref 4.0–10.3)

## 2012-10-04 LAB — COMPREHENSIVE METABOLIC PANEL (CC13)
Alkaline Phosphatase: 96 U/L (ref 40–150)
BUN: 24.9 mg/dL (ref 7.0–26.0)
CO2: 26 mEq/L (ref 22–29)
Glucose: 130 mg/dl (ref 70–140)
Total Bilirubin: 0.51 mg/dL (ref 0.20–1.20)
Total Protein: 7.1 g/dL (ref 6.4–8.3)

## 2012-10-04 LAB — LACTATE DEHYDROGENASE (CC13): LDH: 179 U/L (ref 125–245)

## 2012-10-04 LAB — FERRITIN CHCC: Ferritin: 105 ng/ml (ref 22–316)

## 2012-10-04 LAB — IRON AND TIBC CHCC
Iron: 59 ug/dL (ref 42–163)
TIBC: 310 ug/dL (ref 202–409)
UIBC: 251 ug/dL (ref 117–376)

## 2012-10-07 ENCOUNTER — Telehealth: Payer: Self-pay | Admitting: Hematology and Oncology

## 2012-10-07 NOTE — Telephone Encounter (Signed)
S/w pt re next appt for 10/2 and pt will get schedule when he comes in.

## 2012-10-09 DIAGNOSIS — N182 Chronic kidney disease, stage 2 (mild): Secondary | ICD-10-CM | POA: Diagnosis not present

## 2012-10-09 DIAGNOSIS — I6200 Nontraumatic subdural hemorrhage, unspecified: Secondary | ICD-10-CM | POA: Diagnosis not present

## 2012-10-09 DIAGNOSIS — R209 Unspecified disturbances of skin sensation: Secondary | ICD-10-CM | POA: Diagnosis not present

## 2012-10-09 DIAGNOSIS — I4891 Unspecified atrial fibrillation: Secondary | ICD-10-CM | POA: Diagnosis not present

## 2012-10-09 DIAGNOSIS — K219 Gastro-esophageal reflux disease without esophagitis: Secondary | ICD-10-CM | POA: Diagnosis not present

## 2012-10-09 DIAGNOSIS — I509 Heart failure, unspecified: Secondary | ICD-10-CM | POA: Diagnosis not present

## 2012-10-09 DIAGNOSIS — R42 Dizziness and giddiness: Secondary | ICD-10-CM | POA: Diagnosis not present

## 2012-10-09 DIAGNOSIS — IMO0002 Reserved for concepts with insufficient information to code with codable children: Secondary | ICD-10-CM | POA: Diagnosis not present
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
## 2012-10-25 ENCOUNTER — Encounter: Payer: Self-pay | Admitting: Cardiology

## 2012-10-25 ENCOUNTER — Ambulatory Visit (INDEPENDENT_AMBULATORY_CARE_PROVIDER_SITE_OTHER): Payer: Medicare Other | Admitting: Cardiology

## 2012-10-25 VITALS — BP 92/60 | HR 74 | Ht 72.0 in | Wt 183.0 lb

## 2012-10-25 DIAGNOSIS — I2510 Atherosclerotic heart disease of native coronary artery without angina pectoris: Secondary | ICD-10-CM | POA: Diagnosis not present

## 2012-10-25 DIAGNOSIS — I4891 Unspecified atrial fibrillation: Secondary | ICD-10-CM

## 2012-10-25 DIAGNOSIS — I472 Ventricular tachycardia: Secondary | ICD-10-CM

## 2012-10-25 DIAGNOSIS — Z95810 Presence of automatic (implantable) cardiac defibrillator: Secondary | ICD-10-CM

## 2012-10-25 DIAGNOSIS — I482 Chronic atrial fibrillation, unspecified: Secondary | ICD-10-CM

## 2012-10-25 DIAGNOSIS — Z23 Encounter for immunization: Secondary | ICD-10-CM | POA: Diagnosis not present

## 2012-10-25 DIAGNOSIS — I5022 Chronic systolic (congestive) heart failure: Secondary | ICD-10-CM | POA: Diagnosis not present

## 2012-10-25 NOTE — Patient Instructions (Signed)
Continue your current therapy  I will see you in 6 months.   
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
## 2012-11-01 ENCOUNTER — Other Ambulatory Visit (HOSPITAL_BASED_OUTPATIENT_CLINIC_OR_DEPARTMENT_OTHER): Payer: Medicare Other

## 2012-11-01 DIAGNOSIS — D509 Iron deficiency anemia, unspecified: Secondary | ICD-10-CM | POA: Diagnosis not present

## 2012-11-01 DIAGNOSIS — D649 Anemia, unspecified: Secondary | ICD-10-CM

## 2012-11-01 LAB — CBC WITH DIFFERENTIAL/PLATELET
Basophils Absolute: 0.1 10*3/uL (ref 0.0–0.1)
Eosinophils Absolute: 0.1 10*3/uL (ref 0.0–0.5)
HCT: 40.1 % (ref 38.4–49.9)
HGB: 13.8 g/dL (ref 13.0–17.1)
LYMPH%: 27.6 % (ref 14.0–49.0)
MONO#: 1.4 10*3/uL — ABNORMAL HIGH (ref 0.1–0.9)
NEUT#: 3.5 10*3/uL (ref 1.5–6.5)
NEUT%: 50.8 % (ref 39.0–75.0)
Platelets: 151 10*3/uL (ref 140–400)
WBC: 6.9 10*3/uL (ref 4.0–10.3)

## 2012-11-16 ENCOUNTER — Other Ambulatory Visit: Payer: Self-pay | Admitting: Cardiology

## 2012-11-19 ENCOUNTER — Ambulatory Visit (INDEPENDENT_AMBULATORY_CARE_PROVIDER_SITE_OTHER): Payer: Medicare Other | Admitting: *Deleted

## 2012-11-19 DIAGNOSIS — I509 Heart failure, unspecified: Secondary | ICD-10-CM | POA: Diagnosis not present

## 2012-11-19 DIAGNOSIS — I5023 Acute on chronic systolic (congestive) heart failure: Secondary | ICD-10-CM

## 2012-11-19 DIAGNOSIS — Z95810 Presence of automatic (implantable) cardiac defibrillator: Secondary | ICD-10-CM | POA: Diagnosis not present

## 2012-11-19 DIAGNOSIS — I472 Ventricular tachycardia: Secondary | ICD-10-CM

## 2012-11-28 DIAGNOSIS — Z6825 Body mass index (BMI) 25.0-25.9, adult: Secondary | ICD-10-CM | POA: Diagnosis not present

## 2012-11-28 DIAGNOSIS — I2589 Other forms of chronic ischemic heart disease: Secondary | ICD-10-CM | POA: Diagnosis not present

## 2012-11-28 DIAGNOSIS — J029 Acute pharyngitis, unspecified: Secondary | ICD-10-CM | POA: Diagnosis not present

## 2012-11-28 DIAGNOSIS — J309 Allergic rhinitis, unspecified: Secondary | ICD-10-CM | POA: Diagnosis not present

## 2012-11-28 LAB — REMOTE ICD DEVICE
ATRIAL PACING ICD: 0.27 pct
BRDY-0002RV: 55 {beats}/min
BRDY-0003RV: 130 {beats}/min
DEV-0020ICD: NEGATIVE
FVT: 0
PACEART VT: 0
RV LEAD AMPLITUDE: 11.5 mv
TOT-0001: 1
TZAT-0001ATACH: 1
TZAT-0001ATACH: 3
TZAT-0001FASTVT: 1
TZAT-0001SLOWVT: 1
TZAT-0001SLOWVT: 2
TZAT-0002ATACH: NEGATIVE
TZAT-0002FASTVT: NEGATIVE
TZAT-0012ATACH: 150 ms
TZAT-0012ATACH: 150 ms
TZAT-0012SLOWVT: 170 ms
TZAT-0012SLOWVT: 170 ms
TZAT-0013SLOWVT: 3
TZAT-0013SLOWVT: 4
TZAT-0018ATACH: NEGATIVE
TZAT-0018ATACH: NEGATIVE
TZAT-0018FASTVT: NEGATIVE
TZAT-0018SLOWVT: NEGATIVE
TZAT-0018SLOWVT: NEGATIVE
TZAT-0019SLOWVT: 8 V
TZAT-0020ATACH: 1.5 ms
TZAT-0020SLOWVT: 1.5 ms
TZAT-0020SLOWVT: 1.5 ms
TZON-0003ATACH: 350 ms
TZON-0003SLOWVT: 330 ms
TZON-0003VSLOWVT: 450 ms
TZON-0004SLOWVT: 28
TZON-0004VSLOWVT: 20
TZON-0005SLOWVT: 12
TZST-0001ATACH: 4
TZST-0001ATACH: 5
TZST-0001ATACH: 6
TZST-0001FASTVT: 4
TZST-0001FASTVT: 6
TZST-0001SLOWVT: 3
TZST-0001SLOWVT: 5
TZST-0001SLOWVT: 6
TZST-0002ATACH: NEGATIVE
TZST-0002ATACH: NEGATIVE
TZST-0002ATACH: NEGATIVE
TZST-0002FASTVT: NEGATIVE
TZST-0002FASTVT: NEGATIVE
TZST-0002FASTVT: NEGATIVE
TZST-0003SLOWVT: 35 J
TZST-0003SLOWVT: 35 J
VF: 0

## 2012-12-07 ENCOUNTER — Encounter: Payer: Self-pay | Admitting: *Deleted

## 2012-12-24 ENCOUNTER — Telehealth: Payer: Self-pay | Admitting: Hematology and Oncology

## 2012-12-24 NOTE — Telephone Encounter (Signed)
Pt called to cx 11/26 lb. Per pt will call back to r/s.

## 2012-12-26 ENCOUNTER — Other Ambulatory Visit: Payer: Medicare Other | Admitting: Lab

## 2013-01-05 ENCOUNTER — Other Ambulatory Visit: Payer: Self-pay | Admitting: Cardiology

## 2013-01-09 ENCOUNTER — Other Ambulatory Visit: Payer: Self-pay

## 2013-01-09 MED ORDER — POTASSIUM CHLORIDE CRYS ER 10 MEQ PO TBCR: 10.0000 meq | EXTENDED_RELEASE_TABLET | Freq: Every day | ORAL | Status: DC

## 2013-01-15 DIAGNOSIS — N182 Chronic kidney disease, stage 2 (mild): Secondary | ICD-10-CM | POA: Diagnosis not present

## 2013-01-15 DIAGNOSIS — R42 Dizziness and giddiness: Secondary | ICD-10-CM | POA: Diagnosis not present

## 2013-01-15 DIAGNOSIS — Z6826 Body mass index (BMI) 26.0-26.9, adult: Secondary | ICD-10-CM | POA: Diagnosis not present

## 2013-01-15 DIAGNOSIS — E1129 Type 2 diabetes mellitus with other diabetic kidney complication: Secondary | ICD-10-CM | POA: Diagnosis not present

## 2013-01-15 DIAGNOSIS — I2589 Other forms of chronic ischemic heart disease: Secondary | ICD-10-CM | POA: Diagnosis not present

## 2013-01-15 DIAGNOSIS — R269 Unspecified abnormalities of gait and mobility: Secondary | ICD-10-CM | POA: Diagnosis not present

## 2013-02-20 ENCOUNTER — Other Ambulatory Visit: Payer: Self-pay | Admitting: Hematology and Oncology

## 2013-02-20 ENCOUNTER — Other Ambulatory Visit (HOSPITAL_BASED_OUTPATIENT_CLINIC_OR_DEPARTMENT_OTHER): Payer: Medicare Other

## 2013-02-20 DIAGNOSIS — D509 Iron deficiency anemia, unspecified: Secondary | ICD-10-CM

## 2013-02-20 LAB — CBC & DIFF AND RETIC
BASO%: 0.2 % (ref 0.0–2.0)
Basophils Absolute: 0 10*3/uL (ref 0.0–0.1)
EOS ABS: 0.2 10*3/uL (ref 0.0–0.5)
EOS%: 2.2 % (ref 0.0–7.0)
HEMATOCRIT: 41.1 % (ref 38.4–49.9)
HGB: 13.5 g/dL (ref 13.0–17.1)
IMMATURE RETIC FRACT: 10.3 % (ref 3.00–10.60)
LYMPH#: 2 10*3/uL (ref 0.9–3.3)
LYMPH%: 24.9 % (ref 14.0–49.0)
MCH: 30.3 pg (ref 27.2–33.4)
MCHC: 32.8 g/dL (ref 32.0–36.0)
MCV: 92.2 fL (ref 79.3–98.0)
MONO#: 1.8 10*3/uL — AB (ref 0.1–0.9)
MONO%: 21.9 % — ABNORMAL HIGH (ref 0.0–14.0)
NEUT%: 50.8 % (ref 39.0–75.0)
NEUTROS ABS: 4.1 10*3/uL (ref 1.5–6.5)
PLATELETS: 165 10*3/uL (ref 140–400)
RBC: 4.46 10*6/uL (ref 4.20–5.82)
RDW: 14.5 % (ref 11.0–14.6)
Retic %: 1.5 % (ref 0.80–1.80)
Retic Ct Abs: 66.9 10*3/uL (ref 34.80–93.90)
WBC: 8.2 10*3/uL (ref 4.0–10.3)

## 2013-02-20 LAB — FERRITIN CHCC: FERRITIN: 71 ng/mL (ref 22–316)

## 2013-02-22 ENCOUNTER — Encounter: Payer: Medicare Other | Admitting: *Deleted

## 2013-02-26 ENCOUNTER — Encounter: Payer: Self-pay | Admitting: *Deleted

## 2013-04-08 DIAGNOSIS — L989 Disorder of the skin and subcutaneous tissue, unspecified: Secondary | ICD-10-CM | POA: Diagnosis not present

## 2013-04-08 DIAGNOSIS — M1990 Unspecified osteoarthritis, unspecified site: Secondary | ICD-10-CM | POA: Diagnosis not present

## 2013-04-08 DIAGNOSIS — I4891 Unspecified atrial fibrillation: Secondary | ICD-10-CM | POA: Diagnosis not present

## 2013-04-08 DIAGNOSIS — E119 Type 2 diabetes mellitus without complications: Secondary | ICD-10-CM | POA: Diagnosis not present

## 2013-04-12 ENCOUNTER — Other Ambulatory Visit: Payer: Self-pay | Admitting: Orthopedic Surgery

## 2013-04-12 DIAGNOSIS — M19049 Primary osteoarthritis, unspecified hand: Secondary | ICD-10-CM | POA: Diagnosis not present

## 2013-04-12 DIAGNOSIS — D492 Neoplasm of unspecified behavior of bone, soft tissue, and skin: Secondary | ICD-10-CM | POA: Diagnosis not present

## 2013-04-16 ENCOUNTER — Ambulatory Visit (HOSPITAL_BASED_OUTPATIENT_CLINIC_OR_DEPARTMENT_OTHER): Payer: Medicare Other

## 2013-04-16 ENCOUNTER — Telehealth: Payer: Self-pay | Admitting: Hematology and Oncology

## 2013-04-16 DIAGNOSIS — D509 Iron deficiency anemia, unspecified: Secondary | ICD-10-CM

## 2013-04-16 LAB — CBC & DIFF AND RETIC
BASO%: 0.2 % (ref 0.0–2.0)
BASOS ABS: 0 10*3/uL (ref 0.0–0.1)
EOS%: 0.9 % (ref 0.0–7.0)
Eosinophils Absolute: 0.1 10*3/uL (ref 0.0–0.5)
HCT: 40.5 % (ref 38.4–49.9)
HEMOGLOBIN: 13.5 g/dL (ref 13.0–17.1)
IMMATURE RETIC FRACT: 5.6 % (ref 3.00–10.60)
LYMPH#: 2 10*3/uL (ref 0.9–3.3)
LYMPH%: 29.6 % (ref 14.0–49.0)
MCH: 30.5 pg (ref 27.2–33.4)
MCHC: 33.3 g/dL (ref 32.0–36.0)
MCV: 91.6 fL (ref 79.3–98.0)
MONO#: 1.4 10*3/uL — AB (ref 0.1–0.9)
MONO%: 20.4 % — ABNORMAL HIGH (ref 0.0–14.0)
NEUT#: 3.2 10*3/uL (ref 1.5–6.5)
NEUT%: 48.9 % (ref 39.0–75.0)
Platelets: 186 10*3/uL (ref 140–400)
RBC: 4.42 10*6/uL (ref 4.20–5.82)
RDW: 14.2 % (ref 11.0–14.6)
Retic %: 1.58 % (ref 0.80–1.80)
Retic Ct Abs: 69.84 10*3/uL (ref 34.80–93.90)
WBC: 6.6 10*3/uL (ref 4.0–10.3)

## 2013-04-16 NOTE — Telephone Encounter (Signed)
pt came by today got confused on dates wants lab today

## 2013-04-17 ENCOUNTER — Other Ambulatory Visit: Payer: Medicare Other

## 2013-04-17 ENCOUNTER — Ambulatory Visit (HOSPITAL_BASED_OUTPATIENT_CLINIC_OR_DEPARTMENT_OTHER): Payer: Medicare Other | Admitting: Hematology and Oncology

## 2013-04-17 ENCOUNTER — Encounter: Payer: Self-pay | Admitting: Hematology and Oncology

## 2013-04-17 VITALS — HR 83 | Temp 97.2°F | Resp 18 | Ht 72.0 in | Wt 190.1 lb

## 2013-04-17 DIAGNOSIS — D72821 Monocytosis (symptomatic): Secondary | ICD-10-CM | POA: Insufficient documentation

## 2013-04-17 DIAGNOSIS — Z862 Personal history of diseases of the blood and blood-forming organs and certain disorders involving the immune mechanism: Secondary | ICD-10-CM | POA: Diagnosis not present

## 2013-04-17 DIAGNOSIS — D509 Iron deficiency anemia, unspecified: Secondary | ICD-10-CM

## 2013-04-17 HISTORY — DX: Monocytosis (symptomatic): D72.821

## 2013-04-17 LAB — FERRITIN CHCC: FERRITIN: 50 ng/mL (ref 22–316)
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
## 2013-04-18 ENCOUNTER — Telehealth: Payer: Self-pay | Admitting: Hematology and Oncology

## 2013-04-18 NOTE — Telephone Encounter (Signed)
, °

## 2013-04-25 ENCOUNTER — Ambulatory Visit (INDEPENDENT_AMBULATORY_CARE_PROVIDER_SITE_OTHER): Payer: Medicare Other | Admitting: Cardiology

## 2013-04-25 ENCOUNTER — Encounter: Payer: Self-pay | Admitting: Cardiology

## 2013-04-25 VITALS — BP 110/66 | HR 76 | Ht 72.0 in | Wt 193.0 lb

## 2013-04-25 DIAGNOSIS — I4729 Other ventricular tachycardia: Secondary | ICD-10-CM | POA: Diagnosis not present

## 2013-04-25 DIAGNOSIS — I482 Chronic atrial fibrillation, unspecified: Secondary | ICD-10-CM

## 2013-04-25 DIAGNOSIS — I472 Ventricular tachycardia: Secondary | ICD-10-CM

## 2013-04-25 DIAGNOSIS — I2510 Atherosclerotic heart disease of native coronary artery without angina pectoris: Secondary | ICD-10-CM

## 2013-04-25 DIAGNOSIS — I4891 Unspecified atrial fibrillation: Secondary | ICD-10-CM

## 2013-04-25 DIAGNOSIS — I5022 Chronic systolic (congestive) heart failure: Secondary | ICD-10-CM

## 2013-04-25 LAB — COMPREHENSIVE METABOLIC PANEL
ALT: 16 U/L (ref 0–53)
AST: 28 U/L (ref 0–37)
Albumin: 3.9 g/dL (ref 3.5–5.2)
Alkaline Phosphatase: 81 U/L (ref 39–117)
BUN: 21 mg/dL (ref 6–23)
CO2: 27 mEq/L (ref 19–32)
CREATININE: 1.5 mg/dL (ref 0.4–1.5)
Calcium: 9.2 mg/dL (ref 8.4–10.5)
Chloride: 103 mEq/L (ref 96–112)
GFR: 46.99 mL/min — ABNORMAL LOW (ref >60.00)
Glucose, Bld: 111 mg/dL — ABNORMAL HIGH (ref 70–99)
Potassium: 4.1 mEq/L (ref 3.5–5.1)
Sodium: 138 mEq/L (ref 135–145)
Total Bilirubin: 0.8 mg/dL (ref 0.3–1.2)
Total Protein: 7.1 g/dL (ref 6.0–8.3)

## 2013-04-25 LAB — TSH: TSH: 0.5 u[IU]/mL (ref 0.35–5.50)

## 2013-04-25 NOTE — Addendum Note (Signed)
Addended by: Phineas Inches D on: 04/25/2013 01:48 PM   Modules accepted: Orders
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
## 2013-04-25 NOTE — Patient Instructions (Signed)
Continue your current therapy  I will see you in 6 months.   

## 2013-04-30 DIAGNOSIS — K5900 Constipation, unspecified: Secondary | ICD-10-CM | POA: Diagnosis not present

## 2013-04-30 DIAGNOSIS — Z6826 Body mass index (BMI) 26.0-26.9, adult: Secondary | ICD-10-CM | POA: Diagnosis not present

## 2013-04-30 DIAGNOSIS — E1169 Type 2 diabetes mellitus with other specified complication: Secondary | ICD-10-CM | POA: Diagnosis not present

## 2013-04-30 DIAGNOSIS — M1990 Unspecified osteoarthritis, unspecified site: Secondary | ICD-10-CM | POA: Diagnosis not present

## 2013-04-30 DIAGNOSIS — R269 Unspecified abnormalities of gait and mobility: Secondary | ICD-10-CM | POA: Diagnosis not present

## 2013-04-30 DIAGNOSIS — J309 Allergic rhinitis, unspecified: Secondary | ICD-10-CM | POA: Diagnosis not present

## 2013-04-30 DIAGNOSIS — I2589 Other forms of chronic ischemic heart disease: Secondary | ICD-10-CM | POA: Diagnosis not present

## 2013-05-06 ENCOUNTER — Encounter (HOSPITAL_BASED_OUTPATIENT_CLINIC_OR_DEPARTMENT_OTHER): Payer: Self-pay | Admitting: *Deleted

## 2013-05-06 NOTE — Progress Notes (Signed)
Pt lives along-gets around on own- Had labs 04/25/13 and ekg-8/14 Niece coming with him

## 2013-05-07 ENCOUNTER — Telehealth: Payer: Self-pay | Admitting: Cardiology

## 2013-05-07 NOTE — Telephone Encounter (Signed)
Jeani Hawking from hand center called to speak with [name] Patient is scheduled for surgery and Dr [name] okayed under local only for clearance. Jeani Hawking needed clarification on local only. Call sent to [facility].

## 2013-05-07 NOTE — Telephone Encounter (Signed)
Received call from [facility] at [facility].She stated patient is scheduled for hand surgery this Friday 05/09/13 with Dr.Kuzma.Stated they wanted to do IV regional and not local,wanted to make sure that is ok with Dr.Jordan.Message sent to [place].

## 2013-05-08 NOTE — Telephone Encounter (Signed)
OK to use regional anesthesia for hand surgery  Joshua Friske [name] MD, [facility]

## 2013-05-08 NOTE — Telephone Encounter (Signed)
Returned call to [place] at [place] office Dr.Jordan advised ok to use regional anesthesia for hand surgery.

## 2013-05-09 ENCOUNTER — Encounter (HOSPITAL_BASED_OUTPATIENT_CLINIC_OR_DEPARTMENT_OTHER): Payer: Self-pay | Admitting: *Deleted

## 2013-05-10 ENCOUNTER — Ambulatory Visit (HOSPITAL_BASED_OUTPATIENT_CLINIC_OR_DEPARTMENT_OTHER)
Admission: RE | Admit: 2013-05-10 | Discharge: 2013-05-10 | Disposition: A | Discharge status: Home / Self Care | Payer: Medicare Other | Source: Ambulatory Visit | Attending: Orthopedic Surgery | Admitting: Orthopedic Surgery

## 2013-05-10 ENCOUNTER — Ambulatory Visit (HOSPITAL_BASED_OUTPATIENT_CLINIC_OR_DEPARTMENT_OTHER): Payer: Medicare Other | Admitting: Anesthesiology

## 2013-05-10 ENCOUNTER — Encounter (HOSPITAL_BASED_OUTPATIENT_CLINIC_OR_DEPARTMENT_OTHER): Payer: Medicare Other | Admitting: Anesthesiology

## 2013-05-10 ENCOUNTER — Encounter (HOSPITAL_BASED_OUTPATIENT_CLINIC_OR_DEPARTMENT_OTHER)
Admission: RE | Disposition: A | Discharge status: Home / Self Care | Payer: Self-pay | Source: Ambulatory Visit | Attending: Orthopedic Surgery

## 2013-05-10 ENCOUNTER — Encounter (HOSPITAL_BASED_OUTPATIENT_CLINIC_OR_DEPARTMENT_OTHER): Payer: Self-pay | Admitting: *Deleted

## 2013-05-10 DIAGNOSIS — G8929 Other chronic pain: Secondary | ICD-10-CM | POA: Insufficient documentation

## 2013-05-10 DIAGNOSIS — I10 Essential (primary) hypertension: Secondary | ICD-10-CM | POA: Diagnosis not present

## 2013-05-10 DIAGNOSIS — M7130 Other bursal cyst, unspecified site: Secondary | ICD-10-CM | POA: Diagnosis not present

## 2013-05-10 DIAGNOSIS — Z951 Presence of aortocoronary bypass graft: Secondary | ICD-10-CM | POA: Diagnosis not present

## 2013-05-10 DIAGNOSIS — E119 Type 2 diabetes mellitus without complications: Secondary | ICD-10-CM | POA: Diagnosis not present

## 2013-05-10 DIAGNOSIS — M549 Dorsalgia, unspecified: Secondary | ICD-10-CM | POA: Insufficient documentation

## 2013-05-10 DIAGNOSIS — I2510 Atherosclerotic heart disease of native coronary artery without angina pectoris: Secondary | ICD-10-CM | POA: Insufficient documentation

## 2013-05-10 DIAGNOSIS — K219 Gastro-esophageal reflux disease without esophagitis: Secondary | ICD-10-CM | POA: Diagnosis not present

## 2013-05-10 DIAGNOSIS — R011 Cardiac murmur, unspecified: Secondary | ICD-10-CM | POA: Diagnosis not present

## 2013-05-10 DIAGNOSIS — E785 Hyperlipidemia, unspecified: Secondary | ICD-10-CM | POA: Insufficient documentation

## 2013-05-10 DIAGNOSIS — I4891 Unspecified atrial fibrillation: Secondary | ICD-10-CM | POA: Insufficient documentation

## 2013-05-10 DIAGNOSIS — I509 Heart failure, unspecified: Secondary | ICD-10-CM | POA: Insufficient documentation

## 2013-05-10 DIAGNOSIS — I252 Old myocardial infarction: Secondary | ICD-10-CM | POA: Insufficient documentation

## 2013-05-10 DIAGNOSIS — Z95810 Presence of automatic (implantable) cardiac defibrillator: Secondary | ICD-10-CM | POA: Diagnosis not present

## 2013-05-10 DIAGNOSIS — Z9861 Coronary angioplasty status: Secondary | ICD-10-CM | POA: Diagnosis not present

## 2013-05-10 DIAGNOSIS — Z87891 Personal history of nicotine dependence: Secondary | ICD-10-CM | POA: Insufficient documentation

## 2013-05-10 DIAGNOSIS — M19049 Primary osteoarthritis, unspecified hand: Secondary | ICD-10-CM | POA: Insufficient documentation

## 2013-05-10 DIAGNOSIS — I472 Ventricular tachycardia, unspecified: Secondary | ICD-10-CM | POA: Insufficient documentation

## 2013-05-10 DIAGNOSIS — I4729 Other ventricular tachycardia: Secondary | ICD-10-CM | POA: Diagnosis not present

## 2013-05-10 DIAGNOSIS — I2589 Other forms of chronic ischemic heart disease: Secondary | ICD-10-CM | POA: Insufficient documentation

## 2013-05-10 DIAGNOSIS — M720 Palmar fascial fibromatosis [Dupuytren]: Secondary | ICD-10-CM | POA: Diagnosis not present

## 2013-05-10 DIAGNOSIS — D492 Neoplasm of unspecified behavior of bone, soft tissue, and skin: Secondary | ICD-10-CM | POA: Diagnosis not present

## 2013-05-10 DIAGNOSIS — M129 Arthropathy, unspecified: Secondary | ICD-10-CM | POA: Insufficient documentation

## 2013-05-10 DIAGNOSIS — J45909 Unspecified asthma, uncomplicated: Secondary | ICD-10-CM | POA: Insufficient documentation

## 2013-05-10 HISTORY — PX: PROXIMAL INTERPHALANGEAL FUSION (PIP): SHX6043

## 2013-05-10 HISTORY — PX: MASS EXCISION: SHX2000

## 2013-05-10 LAB — POCT I-STAT, CHEM 8
BUN: 26 mg/dL — ABNORMAL HIGH (ref 6–23)
Calcium, Ion: 1.14 mmol/L (ref 1.13–1.30)
Chloride: 103 meq/L (ref 96–112)
Creatinine, Ser: 1.5 mg/dL — ABNORMAL HIGH (ref 0.50–1.35)
Glucose, Bld: 110 mg/dL — ABNORMAL HIGH (ref 70–99)
HCT: 45 % (ref 39.0–52.0)
Hemoglobin: 15.3 g/dL (ref 13.0–17.0)
Potassium: 4.3 meq/L (ref 3.7–5.3)
Sodium: 140 meq/L (ref 137–147)
TCO2: 24 mmol/L (ref 0–100)

## 2013-05-10 SURGERY — EXCISION MASS
Anesthesia: Monitor Anesthesia Care | Site: Finger | Laterality: Right

## 2013-05-10 MED ORDER — FENTANYL CITRATE 0.05 MG/ML IJ SOLN
INTRAMUSCULAR | Status: AC
  Filled 2013-05-10: qty 2

## 2013-05-10 MED ORDER — MEPERIDINE HCL 25 MG/ML IJ SOLN: 6.2500 mg | INTRAMUSCULAR | Status: DC | PRN

## 2013-05-10 MED ORDER — CEFAZOLIN SODIUM-DEXTROSE 2-3 GM-% IV SOLR: 2.0000 g | INTRAVENOUS | Status: DC

## 2013-05-10 MED ORDER — CEFAZOLIN SODIUM-DEXTROSE 2-3 GM-% IV SOLR
2.0000 g | INTRAVENOUS | Status: AC
  Administered 2013-05-10: 2 g via INTRAVENOUS

## 2013-05-10 MED ORDER — SUCCINYLCHOLINE CHLORIDE 20 MG/ML IJ SOLN
INTRAMUSCULAR | Status: AC
  Filled 2013-05-10: qty 1

## 2013-05-10 MED ORDER — BUPIVACAINE HCL (PF) 0.25 % IJ SOLN
INTRAMUSCULAR | Status: AC
  Filled 2013-05-10: qty 30

## 2013-05-10 MED ORDER — PROPOFOL INFUSION 10 MG/ML OPTIME
INTRAVENOUS | Status: DC | PRN
  Administered 2013-05-10: 50 ug/kg/min via INTRAVENOUS

## 2013-05-10 MED ORDER — OXYCODONE HCL 5 MG/5ML PO SOLN: 5.0000 mg | Freq: Once | ORAL | Status: DC | PRN

## 2013-05-10 MED ORDER — MIDAZOLAM HCL 2 MG/2ML IJ SOLN
INTRAMUSCULAR | Status: AC
  Filled 2013-05-10: qty 2

## 2013-05-10 MED ORDER — CHLORHEXIDINE GLUCONATE 4 % EX LIQD: 60.0000 mL | Freq: Once | CUTANEOUS | Status: DC

## 2013-05-10 MED ORDER — PROPOFOL 10 MG/ML IV BOLUS
INTRAVENOUS | Status: AC
  Filled 2013-05-10: qty 20

## 2013-05-10 MED ORDER — FENTANYL CITRATE 0.05 MG/ML IJ SOLN: 50.0000 ug | INTRAMUSCULAR | Status: DC | PRN

## 2013-05-10 MED ORDER — LIDOCAINE HCL (CARDIAC) 20 MG/ML IV SOLN
INTRAVENOUS | Status: DC | PRN
  Administered 2013-05-10: 60 mg via INTRAVENOUS

## 2013-05-10 MED ORDER — LIDOCAINE HCL (PF) 1 % IJ SOLN
INTRAMUSCULAR | Status: AC
  Filled 2013-05-10: qty 5

## 2013-05-10 MED ORDER — PROPOFOL 10 MG/ML IV EMUL
INTRAVENOUS | Status: AC
  Filled 2013-05-10: qty 50

## 2013-05-10 MED ORDER — BUPIVACAINE HCL (PF) 0.25 % IJ SOLN
INTRAMUSCULAR | Status: DC | PRN
  Administered 2013-05-10: 8 mL

## 2013-05-10 MED ORDER — CEFAZOLIN SODIUM-DEXTROSE 2-3 GM-% IV SOLR
INTRAVENOUS | Status: AC
  Filled 2013-05-10: qty 50

## 2013-05-10 MED ORDER — OXYCODONE HCL 5 MG PO TABS: 5.0000 mg | ORAL_TABLET | Freq: Once | ORAL | Status: DC | PRN

## 2013-05-10 MED ORDER — LIDOCAINE HCL (PF) 0.5 % IJ SOLN
INTRAMUSCULAR | Status: DC | PRN
  Administered 2013-05-10: 35 mL via INTRAVENOUS

## 2013-05-10 MED ORDER — MIDAZOLAM HCL 2 MG/2ML IJ SOLN: 1.0000 mg | INTRAMUSCULAR | Status: DC | PRN

## 2013-05-10 MED ORDER — FENTANYL CITRATE 0.05 MG/ML IJ SOLN: 25.0000 ug | INTRAMUSCULAR | Status: DC | PRN

## 2013-05-10 MED ORDER — LACTATED RINGERS IV SOLN
INTRAVENOUS | Status: DC
  Administered 2013-05-10: 14:00:00 via INTRAVENOUS

## 2013-05-10 MED ORDER — FENTANYL CITRATE 0.05 MG/ML IJ SOLN
INTRAMUSCULAR | Status: DC | PRN
  Administered 2013-05-10 (×2): 25 ug via INTRAVENOUS

## 2013-05-10 SURGICAL SUPPLY — 54 items
BANDAGE COBAN STERILE 2 (GAUZE/BANDAGES/DRESSINGS) IMPLANT
BLADE MINI RND TIP GREEN BEAV (BLADE) ×3 IMPLANT
BLADE SURG 15 STRL LF DISP TIS (BLADE) ×1 IMPLANT
BLADE SURG 15 STRL SS (BLADE) ×3
BNDG CMPR 9X4 STRL LF SNTH (GAUZE/BANDAGES/DRESSINGS) ×1
BNDG COHESIVE 1X5 TAN STRL LF (GAUZE/BANDAGES/DRESSINGS) ×2 IMPLANT
BNDG COHESIVE 3X5 TAN STRL LF (GAUZE/BANDAGES/DRESSINGS) ×3 IMPLANT
BNDG ESMARK 4X9 LF (GAUZE/BANDAGES/DRESSINGS) ×3 IMPLANT
BNDG GAUZE ELAST 4 BULKY (GAUZE/BANDAGES/DRESSINGS) IMPLANT
BUR FAST CUTTING MED (BURR) IMPLANT
CHLORAPREP W/TINT 26ML (MISCELLANEOUS) ×3 IMPLANT
CORDS BIPOLAR (ELECTRODE) ×3 IMPLANT
COVER MAYO STAND STRL (DRAPES) ×3 IMPLANT
COVER TABLE BACK 60X90 (DRAPES) ×3 IMPLANT
CUFF TOURNIQUET SINGLE 18IN (TOURNIQUET CUFF) ×2 IMPLANT
DECANTER SPIKE VIAL GLASS SM (MISCELLANEOUS) IMPLANT
DRAIN PENROSE 1/2X12 LTX STRL (WOUND CARE) IMPLANT
DRAPE EXTREMITY T 121X128X90 (DRAPE) ×3 IMPLANT
DRAPE OEC MINIVIEW 54X84 (DRAPES) ×3 IMPLANT
DRAPE SURG 17X23 STRL (DRAPES) ×3 IMPLANT
DRSG KUZMA FLUFF (GAUZE/BANDAGES/DRESSINGS) ×3 IMPLANT
GAUZE XEROFORM 1X8 LF (GAUZE/BANDAGES/DRESSINGS) ×3 IMPLANT
GLOVE BIOGEL M 7.0 STRL (GLOVE) ×2 IMPLANT
GLOVE BIOGEL PI IND STRL 8.5 (GLOVE) ×1 IMPLANT
GLOVE BIOGEL PI INDICATOR 8.5 (GLOVE) ×2
GLOVE SURG ORTHO 8.0 STRL STRW (GLOVE) ×3 IMPLANT
GOWN STRL REUS W/ TWL LRG LVL3 (GOWN DISPOSABLE) ×1 IMPLANT
GOWN STRL REUS W/TWL LRG LVL3 (GOWN DISPOSABLE) ×3
GOWN STRL REUS W/TWL XL LVL3 (GOWN DISPOSABLE) ×3 IMPLANT
NDL SAFETY ECLIPSE 18X1.5 (NEEDLE) IMPLANT
NEEDLE 27GAX1X1/2 (NEEDLE) IMPLANT
NEEDLE HYPO 18GX1.5 SHARP (NEEDLE)
NS IRRIG 1000ML POUR BTL (IV SOLUTION) ×3 IMPLANT
PACK BASIN DAY SURGERY FS (CUSTOM PROCEDURE TRAY) ×3 IMPLANT
PAD CAST 3X4 CTTN HI CHSV (CAST SUPPLIES) ×1 IMPLANT
PADDING CAST ABS 3INX4YD NS (CAST SUPPLIES)
PADDING CAST ABS 4INX4YD NS (CAST SUPPLIES) ×2
PADDING CAST ABS COTTON 3X4 (CAST SUPPLIES) IMPLANT
PADDING CAST ABS COTTON 4X4 ST (CAST SUPPLIES) ×1 IMPLANT
PADDING CAST COTTON 3X4 STRL (CAST SUPPLIES) ×3
SLEEVE SCD COMPRESS KNEE MED (MISCELLANEOUS) IMPLANT
SPLINT FINGER 3.25 911903 (SOFTGOODS) ×2 IMPLANT
SPLINT PLASTER CAST XFAST 3X15 (CAST SUPPLIES) IMPLANT
SPLINT PLASTER XTRA FASTSET 3X (CAST SUPPLIES)
SPONGE GAUZE 4X4 12PLY (GAUZE/BANDAGES/DRESSINGS) ×3 IMPLANT
STOCKINETTE 4X48 STRL (DRAPES) ×3 IMPLANT
SUT VIC AB 4-0 P2 18 (SUTURE) IMPLANT
SUT VICRYL 4-0 PS2 18IN ABS (SUTURE) ×3 IMPLANT
SUT VICRYL RAPID 5 0 P 3 (SUTURE) IMPLANT
SUT VICRYL RAPIDE 4/0 PS 2 (SUTURE) ×3 IMPLANT
SYR BULB 3OZ (MISCELLANEOUS) ×3 IMPLANT
SYR CONTROL 10ML LL (SYRINGE) IMPLANT
TOWEL OR 17X24 6PK STRL BLUE (TOWEL DISPOSABLE) ×3 IMPLANT
UNDERPAD 30X30 INCONTINENT (UNDERPADS AND DIAPERS) ×3 IMPLANT

## 2013-05-10 NOTE — Discharge Instructions (Addendum)

## 2013-05-10 NOTE — Brief Op Note (Signed)
05/10/2013  2:17 PM  PATIENT:  [name]  80 y.o. male  PRE-OPERATIVE DIAGNOSIS:  Right middle finger mass mass right thumb  POST-OPERATIVE DIAGNOSIS:  Right middle finger mass mass right thumb  PROCEDURE:  Procedure(s): EXCISION MASS RIGHT THUMB (Right) DEBRIDEMENT PROXIMAL INTERPHALANGEAL FUSION (PIP) (Right)  SURGEON:  Surgeon(s) and Role:    * Wynonia Sours, MD - Primary  PHYSICIAN ASSISTANT:   ASSISTANTS: none   ANESTHESIA:   local and regional  EBL:  Total I/O In: 700 [I.V.:700] Out: -   BLOOD ADMINISTERED:none  DRAINS: none   LOCAL MEDICATIONS USED:  BUPIVICAINE   SPECIMEN:  Excision  DISPOSITION OF SPECIMEN:  PATHOLOGY  COUNTS:  YES  TOURNIQUET:   Total Tourniquet Time Documented: Forearm (Right) - 33 minutes Total: Forearm (Right) - 33 minutes   DICTATION: .Other Dictation: Dictation Number [id number]  PLAN OF CARE: Discharge to home after PACU  PATIENT DISPOSITION:  PACU - hemodynamically stable.

## 2013-05-10 NOTE — Progress Notes (Signed)
[employer] does not need to be called per Dr. Al Corpus

## 2013-05-10 NOTE — Transfer of Care (Signed)
Immediate Anesthesia Transfer of Care Note  [name]  Procedure(s) Performed: Procedure(s): EXCISION MASS RIGHT THUMB (Right) DEBRIDEMENT PROXIMAL INTERPHALANGEAL FUSION (PIP) (Right)  Patient Location: PACU  Anesthesia Type:MAC and Bier block  Level of Consciousness: awake, alert  and oriented  Airway & Oxygen Therapy: Patient Spontanous Breathing and Patient connected to face mask oxygen  Post-op Assessment: Report given to PACU RN and Post -op Vital signs reviewed and stable  Post vital signs: Reviewed and stable  Complications: No apparent anesthesia complications

## 2013-05-10 NOTE — Op Note (Signed)
[name], [name] NO.:  [id number]  MEDICAL RECORD NO.:  [id number]  LOCATION:                                 FACILITY:  PHYSICIAN:  [name], M.D.            DATE OF BIRTH:  DATE OF PROCEDURE:  05/10/2013 DATE OF DISCHARGE:                              OPERATIVE REPORT   PREOPERATIVE DIAGNOSES: 1. Mass, right thumb, metacarpophalangeal joint volar aspect. 2. Mass, middle finger PIP joint, with degenerative arthritis.  POSTOPERATIVE DIAGNOSES: 1. Mass, right thumb, metacarpophalangeal joint volar aspect. 2. Mass, middle finger PIP joint, with degenerative arthritis.  OPERATION:  Excision of mass, right thumb with excision of mass, right middle finger.  Debridement of PIP joint, right middle finger.  SURGEON:  [name], M.D.  ANESTHESIA:  IV regional with local infiltration.  ANESTHESIOLOGIST:  [name], M.D.  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  He is desirous of removal of each of these.  Pre, para, postoperative course have been discussed along with risks and complications.  He is aware that there is no guarantee with surgery; possibility of infection; recurrence of injury to arteries, nerves, tendons; incomplete relief of symptoms; dystrophy. In preoperative area, the patient is seen, the extremity marked by both patient and surgeon.  Antibiotic given.  PROCEDURE IN DETAIL:  The patient was brought to the operating room where a forearm-based IV regional anesthetic was carried out without difficulty.  He was prepped using ChloraPrep, supine position with the right arm free.  A 3-minute dry time was allowed.  Time-out taken, confirming the patient and procedure.  The thumb was attended to first. Transverse incision made directly over the  mass, carried down through subcutaneous tissue.  The mass was immediately encountered.  This was white, hard mass surrounded by fibrous tissue, neurovascular bundles radially and ulnarly were identified.  The mass was then excised in toto and sent to Pathology.  The wound was copiously irrigated with saline. The skin then closed with interrupted 4-0 Vicryl Rapide sutures.  A separate incision was then made on the ulnar aspect in the lateral middle finger, carried down through subcutaneous tissue.  A mucoid tumor- type cyst was immediately encountered.  This was deflated, removed with blunt and sharp dissection, and followed back into the PIP joint.  The joint was opened.  This was then debrided with a rongeur, with a synovectomy being performed.  The specimen from both thumb and middle finger was sent to Pathology.  The incision into the joint was made just volar to the lateral bands protecting the collateral ligament on the ulnar side.  The wound was irrigated.  The skin then closed with interrupted 4-0 Vicryl Rapide sutures.  A metacarpal block was given to the middle finger with 0.25 plain Marcaine and a local infiltration was also performed of the thumb with the Marcaine. A sterile compressive dressing was applied.  A splint was applied to the PIP joint to  the middle finger.  On deflation of the tourniquet, all fingers immediately pinked.  He was taken to the recovery room for observation in satisfactory.  He will be discharged home to return to the [facility] in 1 week.  He has Ultram to take at home.          ______________________________ [name], M.D.     GK/MEDQ  D:  05/10/2013  T:  05/10/2013  Job:  [id number]

## 2013-05-10 NOTE — Anesthesia Procedure Notes (Signed)
Procedure Name: MAC Performed by: Jaiven Graveline W Pre-anesthesia Checklist: Patient identified, Timeout performed, Emergency Drugs available, Suction available and Patient being monitored Patient Re-evaluated:Patient Re-evaluated prior to inductionOxygen Delivery Method: Simple face mask Placement Confirmation: positive ETCO2 Dental Injury: Teeth and Oropharynx as per pre-operative assessment      

## 2013-05-10 NOTE — Anesthesia Preprocedure Evaluation (Addendum)
Anesthesia Evaluation  Patient identified by MRN, date of birth, ID band Patient awake    Reviewed: Allergy & Precautions, H&P , NPO status , Patient's Chart, lab work & pertinent test results, reviewed documented beta blocker date and time   History of Anesthesia Complications Negative for: history of anesthetic complications  Airway Mallampati: II TM Distance: >3 FB Neck ROM: Full    Dental  (+) Edentulous Upper, Edentulous Lower   Pulmonary shortness of breath, former smoker (quit '78),  breath sounds clear to auscultation        Cardiovascular hypertension, Pt. on medications and Pt. on home beta blockers - angina+ CAD, + Past MI, + Cardiac Stents ('04, '08) and + CABG ('83) + dysrhythmias Atrial Fibrillation and Ventricular Tachycardia + pacemaker + Cardiac Defibrillator (for VT) Rhythm:Regular Rate:Normal  '13 EF 35-40%   Neuro/Psych H/o SDH '13    GI/Hepatic negative GI ROS, Neg liver ROS,   Endo/Other  diabetes (diet controlled, glu 110)  Renal/GU Renal InsufficiencyRenal disease (creat 1.50)     Musculoskeletal   Abdominal   Peds  Hematology negative hematology ROS (+)   Anesthesia Other Findings   Reproductive/Obstetrics                        Anesthesia Physical Anesthesia Plan  ASA: IV  Anesthesia Plan: MAC and Bier Block   Post-op Pain Management:    Induction:   Airway Management Planned: Natural Airway and Simple Face Mask  Additional Equipment:   Intra-op Plan:   Post-operative Plan:   Informed Consent: I have reviewed the patients History and Physical, chart, labs and discussed the procedure including the risks, benefits and alternatives for the proposed anesthesia with the patient or authorized representative who has indicated his/her understanding and acceptance.     Plan Discussed with: CRNA and Surgeon  Anesthesia Plan Comments: (Plan routine monitors, IV  regional Lidocaine Cards recommends magnet deactivation of AICD intra-op )       Anesthesia Quick Evaluation
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
## 2013-05-10 NOTE — Anesthesia Postprocedure Evaluation (Signed)
  Anesthesia Post-op Note  [name]  Procedure(s) Performed: Procedure(s): EXCISION MASS RIGHT THUMB (Right) DEBRIDEMENT PROXIMAL INTERPHALANGEAL FUSION (PIP) (Right)  Patient Location: PACU  Anesthesia Type:MAC and Bier block  Level of Consciousness: awake, alert , oriented and patient cooperative  Airway and Oxygen Therapy: Patient Spontanous Breathing  Post-op Pain: none  Post-op Assessment: Post-op Vital signs reviewed, Patient's Cardiovascular Status Stable, Respiratory Function Stable, Patent Airway, No signs of Nausea or vomiting and Pain level controlled  Post-op Vital Signs: Reviewed and stable  Last Vitals:  Filed Vitals:   05/10/13 1515  BP: 113/51  Pulse: 67  Temp:   Resp: 16    Complications: No apparent anesthesia complications

## 2013-05-10 NOTE — Op Note (Signed)
Dictation Number [id number]

## 2013-05-12 ENCOUNTER — Other Ambulatory Visit: Payer: Self-pay | Admitting: Cardiology

## 2013-05-14 ENCOUNTER — Encounter (HOSPITAL_BASED_OUTPATIENT_CLINIC_OR_DEPARTMENT_OTHER): Payer: Self-pay | Admitting: Orthopedic Surgery

## 2013-05-20 NOTE — Telephone Encounter (Signed)
error 

## 2013-06-03 DIAGNOSIS — H2510 Age-related nuclear cataract, unspecified eye: Secondary | ICD-10-CM | POA: Diagnosis not present

## 2013-06-03 DIAGNOSIS — H18519 Endothelial corneal dystrophy, unspecified eye: Secondary | ICD-10-CM | POA: Diagnosis not present

## 2013-06-22 ENCOUNTER — Other Ambulatory Visit: Payer: Self-pay | Admitting: Cardiology

## 2013-06-29 ENCOUNTER — Other Ambulatory Visit: Payer: Self-pay | Admitting: Cardiology

## 2013-07-19 DIAGNOSIS — M5020 Other cervical disc displacement, unspecified cervical region: Secondary | ICD-10-CM | POA: Diagnosis not present

## 2013-07-19 DIAGNOSIS — Z6827 Body mass index (BMI) 27.0-27.9, adult: Secondary | ICD-10-CM | POA: Diagnosis not present

## 2013-07-19 DIAGNOSIS — K219 Gastro-esophageal reflux disease without esophagitis: Secondary | ICD-10-CM | POA: Diagnosis not present

## 2013-07-19 DIAGNOSIS — R350 Frequency of micturition: Secondary | ICD-10-CM | POA: Diagnosis not present

## 2013-07-19 DIAGNOSIS — N182 Chronic kidney disease, stage 2 (mild): Secondary | ICD-10-CM | POA: Diagnosis not present

## 2013-07-30 DIAGNOSIS — H5710 Ocular pain, unspecified eye: Secondary | ICD-10-CM | POA: Diagnosis not present

## 2013-08-07 ENCOUNTER — Telehealth: Payer: Self-pay | Admitting: Cardiology

## 2013-08-07 NOTE — Telephone Encounter (Signed)
Informed pt that [employer] was doing updates to home monitors and that we needed him to send manual transmission. Pt verbalized understanding.

## 2013-08-13 ENCOUNTER — Telehealth: Payer: Self-pay | Admitting: Hematology and Oncology

## 2013-08-13 NOTE — Telephone Encounter (Signed)
pt called back and just needed to confirm appt....done...pt ok and aware

## 2013-08-13 NOTE — Telephone Encounter (Signed)
returned pt call.///call would not go through

## 2013-08-20 DIAGNOSIS — D649 Anemia, unspecified: Secondary | ICD-10-CM | POA: Diagnosis not present

## 2013-08-20 DIAGNOSIS — E785 Hyperlipidemia, unspecified: Secondary | ICD-10-CM | POA: Diagnosis not present

## 2013-08-22 ENCOUNTER — Encounter: Payer: Self-pay | Admitting: Internal Medicine

## 2013-08-22 ENCOUNTER — Ambulatory Visit (INDEPENDENT_AMBULATORY_CARE_PROVIDER_SITE_OTHER): Payer: Medicare Other | Admitting: Internal Medicine

## 2013-08-22 VITALS — BP 123/78 | HR 64 | Ht 72.0 in | Wt 184.4 lb

## 2013-08-22 DIAGNOSIS — I5023 Acute on chronic systolic (congestive) heart failure: Secondary | ICD-10-CM

## 2013-08-22 DIAGNOSIS — I4729 Other ventricular tachycardia: Secondary | ICD-10-CM

## 2013-08-22 DIAGNOSIS — I4891 Unspecified atrial fibrillation: Secondary | ICD-10-CM | POA: Diagnosis not present

## 2013-08-22 DIAGNOSIS — I5022 Chronic systolic (congestive) heart failure: Secondary | ICD-10-CM | POA: Diagnosis not present

## 2013-08-22 DIAGNOSIS — I2510 Atherosclerotic heart disease of native coronary artery without angina pectoris: Secondary | ICD-10-CM | POA: Diagnosis not present

## 2013-08-22 DIAGNOSIS — Z95810 Presence of automatic (implantable) cardiac defibrillator: Secondary | ICD-10-CM | POA: Diagnosis not present

## 2013-08-22 DIAGNOSIS — I472 Ventricular tachycardia: Secondary | ICD-10-CM | POA: Diagnosis not present

## 2013-08-22 DIAGNOSIS — I509 Heart failure, unspecified: Secondary | ICD-10-CM

## 2013-08-22 DIAGNOSIS — I482 Chronic atrial fibrillation, unspecified: Secondary | ICD-10-CM

## 2013-08-22 LAB — MDC_IDC_ENUM_SESS_TYPE_INCLINIC
Battery Voltage: 3.05 V
Brady Statistic AP VP Percent: 0.1 %
Brady Statistic AS VP Percent: 21.25 %
Brady Statistic RA Percent Paced: 0.17 %
Brady Statistic RV Percent Paced: 21.35 %
HIGH POWER IMPEDANCE MEASURED VALUE: 55 Ohm
HighPow Impedance: 47 Ohm
Lead Channel Impedance Value: 532 Ohm
Lead Channel Pacing Threshold Amplitude: 0.75 V
Lead Channel Pacing Threshold Pulse Width: 0.4 ms
Lead Channel Pacing Threshold Pulse Width: 0.4 ms
Lead Channel Sensing Intrinsic Amplitude: 0.625 mV
Lead Channel Sensing Intrinsic Amplitude: 13.25 mV
Lead Channel Setting Pacing Amplitude: 2 V
Lead Channel Setting Pacing Amplitude: 2.5 V
Lead Channel Setting Pacing Pulse Width: 0.4 ms
Lead Channel Setting Sensing Sensitivity: 0.3 mV
MDC IDC MSMT LEADCHNL RA IMPEDANCE VALUE: 475 Ohm
MDC IDC MSMT LEADCHNL RA SENSING INTR AMPL: 0.625 mV
MDC IDC MSMT LEADCHNL RV PACING THRESHOLD AMPLITUDE: 1 V
MDC IDC MSMT LEADCHNL RV SENSING INTR AMPL: 11.25 mV
MDC IDC SESS DTM: 20150723141742
MDC IDC SET ZONE DETECTION INTERVAL: 300 ms
MDC IDC STAT BRADY AP VS PERCENT: 0.06 %
MDC IDC STAT BRADY AS VS PERCENT: 78.59 %
Zone Setting Detection Interval: 330 ms
Zone Setting Detection Interval: 350 ms
Zone Setting Detection Interval: 450 ms

## 2013-08-22 NOTE — Assessment & Plan Note (Signed)
His symptoms are class 2. He will continue his current meds.  

## 2013-08-22 NOTE — Assessment & Plan Note (Signed)
He has had none in many years. I have asked the patient to stop amiodarone. Hopefully his VT will remain quiet. He has had some jerking sensations which I wonder might be from the amiodarone.

## 2013-08-22 NOTE — Patient Instructions (Addendum)
Your physician wants you to follow-up in: 12 months with Dr Knox Saliva will receive a reminder letter in the mail two months in advance. If you don't receive a letter, please call our office to schedule the follow-up appointment.   Remote monitoring is used to monitor your Pacemaker or ICD from home. This monitoring reduces the number of office visits required to check your device to one time per year. It allows [place] to keep an eye on the functioning of your device to ensure it is working properly. You are scheduled for a device check from home on 11/25/13. You may send your transmission at any time that day. If you have a wireless device, the transmission will be sent automatically. After your physician reviews your transmission, you will receive a postcard with your next transmission date.   Your physician has recommended you make the following change in your medication:  1) Stop Amiodarone

## 2013-08-22 NOTE — Progress Notes (Signed)
HPI Mr. Ankney returns today for followup.he is a very pleasant 80 year old man with a history of chronic atrial fibrillation, symptomatic tachycardia bradycardia syndrome, status post ICD implantation, with a remote history of ventricular arrhythmias. He has been on amiodarone but has chronic atrial fib. He uses Lasix for control of his peripheral edema. He notes an occaisional "jerking sensation" in his legs Allergies  Allergen Reactions  . Celebrex [Celecoxib] Hives and Other (See Comments)    Gi upset  . Tamsulosin Other (See Comments)    Dizziness, Made BP very low and weakness  . Digoxin Other (See Comments)    Unknown   . Esomeprazole Magnesium Hives    "don't really remember"  . Hydrocodone-Acetaminophen Other (See Comments)    Bad headache  . Multaq [Dronedarone Hydrochloride] Other (See Comments)    "don't remember"  . Protonix [Pantoprazole Sodium] Nausea And Vomiting    Tolerates Dexilant        Past Medical History  Diagnosis Date  . Chronic back pain     "top of neck to lower back"  . Ischemic cardiomyopathy     WITH CHF  . CHF (congestive heart failure)     EF 35-40% s/p most recent ICD generator change-out with Medtronic dual-chamber ICD 05/20/11 with explantation of previous abdominally-implanted device  . Hypertension   . Dyslipidemia   . Erythrocytosis   . GERD (gastroesophageal reflux disease)   . Abnormal thyroid scan     Abnormal thyroid imaging studies from 11/09/2010, status post ultrasound guided fine needle aspiration of the dominant left inferior thyroid nodule on 12/15/2010. Cytology report showed rare follicular epithelial cells and hemosiderin laden macrophages.  . Headache(784.0)   . Atrial fibrillation     on chronic Coumadin; stopped July 2013 due to subdural hematomas  . VT (ventricular tachycardia)   . ICD (implantable cardiac defibrillator) in place   . Atrial fibrillation   . Heart murmur   . Myocardial infarction 1983; ~ 1990  .  Shortness of breath     "once in awhile when I'm relaxing"  . Asthma   . Diabetes mellitus     diet controlled  . Arthritis     "all over"  . Family history of anesthesia complication   . Subdural hematoma July 2013    Anticoagulation stopped.   . Pneumonia August 2013  . Monocytosis 04/17/2013  . Wears glasses   . 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 Coronary artery disease     s/p CABG 1983 and PCI/stent 2004.   . Anginal pain     ROS:   All systems reviewed and negative except as noted in the HPI.   Past Surgical History  Procedure Laterality Date  . Cardiac defibrillator placement      replaced April, 2013  . Coronary artery bypass graft  1983  . Cholecystectomy    . Esophagogastroduodenoscopy  02/11/2011    Procedure: ESOPHAGOGASTRODUODENOSCOPY (EGD);  Surgeon: Beryle Beams, MD;  Location: [facility];  Service: Endoscopy;  Laterality: N/A;  . Colonoscopy  07/07/2011    Procedure: COLONOSCOPY;  Surgeon: Jerene Bears, MD;  Location: [facility];  Service: Gastroenterology;  Laterality: N/A;  . Tonsillectomy      "done when I was a kid"  . Coronary angioplasty with stent placement  10/17/06    "first and only"  . Knee arthroscopy      right; "just went in and scraped it"  . Mass excision Right 05/10/2013    Procedure: EXCISION MASS  RIGHT THUMB;  Surgeon: Wynonia Sours, MD;  Location: [facility];  Service: Orthopedics;  Laterality: Right;  . Proximal interphalangeal fusion (pip) Right 05/10/2013    Procedure: DEBRIDEMENT PROXIMAL INTERPHALANGEAL FUSION (PIP);  Surgeon: Wynonia Sours, MD;  Location: [facility];  Service: Orthopedics;  Laterality: Right;     Family History  Problem Relation Age of Onset  . Heart disease Brother   . Diabetes Sister   . Diabetes Brother   . Tuberculosis Mother   . Tuberculosis Father   . Clotting disorder Brother      History   Social History  . Marital Status: Divorced    Spouse Name: N/A    Number of Children: 2   . Years of Education: N/A   Occupational History  . real estate    Social History Main Topics  . Smoking status: Former Smoker -- 2.00 packs/day for 30 years    Types: Cigarettes    Quit date: 06/23/1976  . Smokeless tobacco: Never Used  . Alcohol Use: Yes     Comment: 08/17/11 "used to drink a little bit; last drink was 4-5 years ago"  . Drug Use: No  . Sexual Activity: No   Other Topics Concern  . Not on file   Social History Narrative  . No narrative on file     BP 123/78  Pulse 64  Ht 6' (1.829 m)  Wt 184 lb 6.4 oz (83.643 kg)  BMI 25.00 kg/m2  Physical Exam:  Well appearing 80 year old man, NAD HEENT: Unremarkable Neck: 7 cm JVD, no thyromegally Lungs:  Clear with no wheezes, rales, or rhonchi. HEART:  Regular rate rhythm, no murmurs, no rubs, no clicks Abd:  soft, positive bowel sounds, no organomegally, no rebound, no guarding Ext:  2 plus pulses, no edema, no cyanosis, no clubbing Skin:  No rashes no nodules Neuro:  CN II through XII intact, motor grossly intact   DEVICE  Normal device function.  See PaceArt for details.   Assess/Plan:

## 2013-08-22 NOTE — Assessment & Plan Note (Signed)
His medtronic DDD ICD is working normally. Will recheck in several months. 

## 2013-08-22 NOTE — Assessment & Plan Note (Signed)
His ventricular rate is reasonably well controlled. He will continue his current meds except amio and we have reprogrammed his device pacing mode to DDI.

## 2013-08-31 ENCOUNTER — Other Ambulatory Visit: Payer: Self-pay | Admitting: Cardiology

## 2013-09-13 ENCOUNTER — Telehealth: Payer: Self-pay | Admitting: Cardiology

## 2013-09-13 NOTE — Telephone Encounter (Signed)
Pt. Has some questions he wants to ask you

## 2013-09-13 NOTE — Telephone Encounter (Signed)
Left message for pt to call.

## 2013-09-13 NOTE — Telephone Encounter (Signed)
[name] is calling because he has some questions and would like to speak to someone .Marland Kitchen Please call  Thanks

## 2013-09-16 NOTE — Telephone Encounter (Signed)
Pt called in wanting to speak to Dr. Doug Sou nurse about possibly coming in sooner because he had been dizzy and his BP has been elevated. He stated that his PCP thought that he should come in and be seen to make sure everything ok. Please call  Thanks

## 2013-09-16 NOTE — Telephone Encounter (Signed)
Returned call to patient he stated he has been dizzy and having headaches.Stated he saw PCP Dr.Avva and he advised to see Dr.Jordan soon.Stated Dr.Avva prescribed 2 medications for low urine stream and he could not take either one,both caused headache,dizziness.Stated only can remember one which samples were given to him Myrbetrig.Stated he is dizzy and having headache today.Appointment scheduled with Dr.Jordan Wednesday 09/18/13 at 4:45 pm.No extender appointments available.Dr.Avva's office notes requested.

## 2013-09-18 ENCOUNTER — Encounter: Payer: Self-pay | Admitting: Cardiology

## 2013-09-18 ENCOUNTER — Ambulatory Visit (INDEPENDENT_AMBULATORY_CARE_PROVIDER_SITE_OTHER): Payer: Medicare Other | Admitting: Cardiology

## 2013-09-18 VITALS — BP 102/64 | HR 82 | Ht 72.0 in | Wt 184.0 lb

## 2013-09-18 DIAGNOSIS — I5022 Chronic systolic (congestive) heart failure: Secondary | ICD-10-CM | POA: Diagnosis not present

## 2013-09-18 DIAGNOSIS — I2510 Atherosclerotic heart disease of native coronary artery without angina pectoris: Secondary | ICD-10-CM | POA: Diagnosis not present

## 2013-09-18 DIAGNOSIS — I472 Ventricular tachycardia, unspecified: Secondary | ICD-10-CM

## 2013-09-18 DIAGNOSIS — I4729 Other ventricular tachycardia: Secondary | ICD-10-CM

## 2013-09-18 DIAGNOSIS — I482 Chronic atrial fibrillation, unspecified: Secondary | ICD-10-CM

## 2013-09-18 DIAGNOSIS — I4891 Unspecified atrial fibrillation: Secondary | ICD-10-CM | POA: Diagnosis not present

## 2013-09-18 NOTE — Patient Instructions (Signed)
Continue your current therapy  You can take Tylenol for headache.  I will see you in 6 months.
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
## 2013-09-27 DIAGNOSIS — E785 Hyperlipidemia, unspecified: Secondary | ICD-10-CM | POA: Diagnosis not present

## 2013-09-27 DIAGNOSIS — N183 Chronic kidney disease, stage 3 unspecified: Secondary | ICD-10-CM | POA: Diagnosis not present

## 2013-10-08 DIAGNOSIS — I509 Heart failure, unspecified: Secondary | ICD-10-CM | POA: Diagnosis not present

## 2013-10-08 DIAGNOSIS — N183 Chronic kidney disease, stage 3 unspecified: Secondary | ICD-10-CM | POA: Diagnosis not present

## 2013-10-08 DIAGNOSIS — I6200 Nontraumatic subdural hemorrhage, unspecified: Secondary | ICD-10-CM | POA: Diagnosis not present

## 2013-10-08 DIAGNOSIS — D649 Anemia, unspecified: Secondary | ICD-10-CM | POA: Diagnosis not present

## 2013-10-08 DIAGNOSIS — E785 Hyperlipidemia, unspecified: Secondary | ICD-10-CM | POA: Diagnosis not present

## 2013-10-08 DIAGNOSIS — I4891 Unspecified atrial fibrillation: Secondary | ICD-10-CM | POA: Diagnosis not present

## 2013-10-08 DIAGNOSIS — R350 Frequency of micturition: Secondary | ICD-10-CM | POA: Diagnosis not present

## 2013-10-08 DIAGNOSIS — E079 Disorder of thyroid, unspecified: Secondary | ICD-10-CM | POA: Diagnosis not present

## 2013-10-08 DIAGNOSIS — Z1331 Encounter for screening for depression: Secondary | ICD-10-CM | POA: Diagnosis not present

## 2013-10-08 DIAGNOSIS — E1129 Type 2 diabetes mellitus with other diabetic kidney complication: Secondary | ICD-10-CM | POA: Diagnosis not present

## 2013-10-14 DIAGNOSIS — Z23 Encounter for immunization: Secondary | ICD-10-CM | POA: Diagnosis not present

## 2013-10-25 ENCOUNTER — Ambulatory Visit: Payer: Medicare Other | Admitting: Cardiology

## 2013-11-25 ENCOUNTER — Encounter: Payer: Medicare Other | Admitting: *Deleted

## 2013-11-25 ENCOUNTER — Telehealth: Payer: Self-pay | Admitting: Cardiology

## 2013-11-25 NOTE — Telephone Encounter (Signed)
Spoke with pt and reminded pt of remote transmission that is due today. Pt verbalized understanding.   

## 2013-11-29 ENCOUNTER — Telehealth: Payer: Self-pay | Admitting: Internal Medicine

## 2013-11-29 NOTE — Telephone Encounter (Signed)
Pt had problems with transmission last week and wants to know if it came through, pls call

## 2013-12-02 ENCOUNTER — Encounter: Payer: Self-pay | Admitting: Cardiology

## 2013-12-02 NOTE — Telephone Encounter (Signed)
Attempted to return call. No answer, no VM.

## 2013-12-02 NOTE — Telephone Encounter (Signed)
Pt has changed phone svc to [employer] cable. I sent him a DSL filter.

## 2013-12-05 ENCOUNTER — Telehealth: Payer: Self-pay | Admitting: Internal Medicine

## 2013-12-05 NOTE — Telephone Encounter (Signed)
Spoke with patient.  He is unable to transmit remotely.  Office visit scheduled for 12/18/13.

## 2013-12-05 NOTE — Telephone Encounter (Signed)
New message ° ° ° ° ° ° °Did we get his remote transmission? °

## 2013-12-13 ENCOUNTER — Encounter: Payer: Self-pay | Admitting: *Deleted

## 2013-12-16 ENCOUNTER — Ambulatory Visit (INDEPENDENT_AMBULATORY_CARE_PROVIDER_SITE_OTHER): Payer: Medicare Other | Admitting: Gastroenterology

## 2013-12-16 ENCOUNTER — Encounter: Payer: Self-pay | Admitting: Gastroenterology

## 2013-12-16 VITALS — BP 104/76 | HR 72 | Ht 69.0 in | Wt 190.1 lb

## 2013-12-16 DIAGNOSIS — K219 Gastro-esophageal reflux disease without esophagitis: Secondary | ICD-10-CM | POA: Diagnosis not present

## 2013-12-16 DIAGNOSIS — I2510 Atherosclerotic heart disease of native coronary artery without angina pectoris: Secondary | ICD-10-CM

## 2013-12-16 DIAGNOSIS — R1314 Dysphagia, pharyngoesophageal phase: Secondary | ICD-10-CM

## 2013-12-16 MED ORDER — SUCRALFATE 1 G PO TABS: 1.0000 g | ORAL_TABLET | Freq: Four times a day (QID) | ORAL | Status: DC

## 2013-12-16 NOTE — Patient Instructions (Signed)
We have sent the following medications to your pharmacy for you to pick up at your convenience: Carafate Tablet, dissolve one tablet in water and drink before meals and at bedtime

## 2013-12-18 ENCOUNTER — Ambulatory Visit (INDEPENDENT_AMBULATORY_CARE_PROVIDER_SITE_OTHER): Payer: Medicare Other | Admitting: *Deleted

## 2013-12-18 DIAGNOSIS — I5023 Acute on chronic systolic (congestive) heart failure: Secondary | ICD-10-CM | POA: Diagnosis not present

## 2013-12-18 DIAGNOSIS — I472 Ventricular tachycardia: Secondary | ICD-10-CM | POA: Diagnosis not present

## 2013-12-18 DIAGNOSIS — I4729 Other ventricular tachycardia: Secondary | ICD-10-CM

## 2013-12-18 LAB — MDC_IDC_ENUM_SESS_TYPE_INCLINIC
Brady Statistic AP VP Percent: 0.05 %
Brady Statistic AP VS Percent: 0.03 %
Brady Statistic AS VS Percent: 84.88 %
Brady Statistic RV Percent Paced: 15.09 %
HighPow Impedance: 45 Ohm
HighPow Impedance: 54 Ohm
Lead Channel Pacing Threshold Amplitude: 0.75 V
Lead Channel Pacing Threshold Pulse Width: 0.4 ms
Lead Channel Sensing Intrinsic Amplitude: 11.375 mV
Lead Channel Setting Pacing Amplitude: 2 V
Lead Channel Setting Pacing Pulse Width: 0.4 ms
Lead Channel Setting Sensing Sensitivity: 0.3 mV
MDC IDC MSMT BATTERY VOLTAGE: 3.01 V
MDC IDC MSMT LEADCHNL RA IMPEDANCE VALUE: 475 Ohm
MDC IDC MSMT LEADCHNL RA SENSING INTR AMPL: 1 mV
MDC IDC MSMT LEADCHNL RV IMPEDANCE VALUE: 532 Ohm
MDC IDC MSMT LEADCHNL RV SENSING INTR AMPL: 13.125 mV
MDC IDC SESS DTM: 20151118172542
MDC IDC SET LEADCHNL RV PACING AMPLITUDE: 2.5 V
MDC IDC SET ZONE DETECTION INTERVAL: 300 ms
MDC IDC SET ZONE DETECTION INTERVAL: 350 ms
MDC IDC STAT BRADY AS VP PERCENT: 15.04 %
MDC IDC STAT BRADY RA PERCENT PACED: 0.08 %
Zone Setting Detection Interval: 330 ms
Zone Setting Detection Interval: 450 ms

## 2013-12-18 NOTE — Progress Notes (Signed)
ICD check in clinic. Normal device function. Threshold and sensing consistent with previous device measurements. Impedance trends stable over time. No evidence of any ventricular arrhythmias. Persistant AF x 27 months----unable to A/C---h/o subdural hematoma. Histogram distribution appropriate for patient and level of activity. No changes made this session. Device programmed at appropriate safety margins. Device programmed to optimize intrinsic conduction. Batt voltage 3.01V(ERI 2.63V). Pt enrolled in remote follow-up. Plan to follow up via [facility] on 2-18 and with [facility] in 08-2014. Patient education completed including shock plan. Alert tones demonstrated for patient.

## 2013-12-22 ENCOUNTER — Other Ambulatory Visit: Payer: Self-pay

## 2013-12-22 MED ORDER — ROSUVASTATIN CALCIUM 5 MG PO TABS: ORAL_TABLET | ORAL | Status: DC

## 2013-12-22 MED ORDER — LISINOPRIL 10 MG PO TABS: 10.0000 mg | ORAL_TABLET | Freq: Every day | ORAL | Status: DC

## 2013-12-31 DIAGNOSIS — K219 Gastro-esophageal reflux disease without esophagitis: Secondary | ICD-10-CM | POA: Insufficient documentation

## 2013-12-31 DIAGNOSIS — R1314 Dysphagia, pharyngoesophageal phase: Secondary | ICD-10-CM | POA: Insufficient documentation

## 2013-12-31 NOTE — Progress Notes (Addendum)
     12/31/2013 [name] [id number] 10-Aug-1932   History of Present Illness:  This is a 80 year old male how is previously known to Dr. Hilarie Fredrickson, last seen 2013.  Had colonoscopy 02/2011 by Dr. Benson Norway at which time he was found to have ?distal varix vs vascular bleb and ?portal hypertensive gastropathy.  Has no known history of liver disease.  He then had colonoscopy 07/2011 by Dr. Hilarie Fredrickson, which showed mild diverticulosis in the left colon and 4 polyps that were removed that were tubular adenomas on pathology.  No repeat colonoscopy recommended due to age.  Also had 2 angioectasias found in the ascending colon that were ablated with APC.  He presents to our office today with complaints of dysphagia, sore throat, and GERD.  He thinks that his sore throat is mostly due to allergies.  He is on Dexilant 60 mg daily for GERD.  Complains of dysphagia to "crumbly/dry stuff"; has coughing when he eats that stuff.  Points to pharynx/upper esophagus.  No problems swallowing meats.  He does not think that he should really be put to sleep for any procedures.  Was previously on carafate for some time and thinks that this helped some of his symptoms in the past.  He has extensive PMH including ischemic cardiomyopathy with CHF (EF 35-40%) with ICD placed, HTN, HLD, atrial fibrillation previously on coumadin but stopped due to subdural hematomas in 2013, CAD s/p CABG in 1983 and stent in 2004, and chronic neck/back pain.   Current Medications, Allergies, Past Medical History, Past Surgical History, Family History and Social History were reviewed in [employer] record.   Physical Exam: BP 104/76 mmHg  Pulse 72  Ht 5\' 9"  (1.753 m)  Wt 190 lb 2 oz (86.24 kg)  BMI 28.06 kg/m2 General: Well developed, white male in no acute distress Head: Normocephalic and atraumatic Eyes:  Sclerae anicteric, conjunctiva pink  Ears: Normal auditory acuity Lungs: Clear throughout to auscultation Heart:  No murmurs noted. Abdomen: Soft, non-distended.  Normal bowel sounds.  Non-tender. Musculoskeletal: Symmetrical with no gross deformities  Extremities: No edema  Neurological: Alert oriented x 4, grossly non-focal Psychological:  Alert and cooperative. Normal mood and affect  Assessment and Recommendations: -Dysphagia:  Sounds more oropharyngeal in etiology.  I offered him an esophagram vs speech pathology referral for evaluation but he declined.  Sore throat may be a combination of allergies and reflux.  He will continue Dexilant 60 mg daily and we will add carafate suspension four times per day to see if this helps.  He requested to follow-up only if having ongoing issues despite the carafate or if he decides to proceed with the esophagram or speech pathology referral.  Addendum: Reviewed and agree with management. Jerene Bears, MD

## 2014-01-06 ENCOUNTER — Telehealth: Payer: Self-pay | Admitting: Gastroenterology

## 2014-01-06 DIAGNOSIS — R1314 Dysphagia, pharyngoesophageal phase: Secondary | ICD-10-CM

## 2014-01-06 NOTE — Telephone Encounter (Signed)
No Answer at number given. Will try again later.

## 2014-01-06 NOTE — Telephone Encounter (Signed)
Spoke with patient and he states his throat is not any better maybe even worse. He is willing to have the "xray" now. Please, advise.

## 2014-01-06 NOTE — Telephone Encounter (Signed)
Ok to order esophagram with tablet.  Thank you,  Jess

## 2014-01-06 NOTE — Telephone Encounter (Signed)
Scheduled esophagram with tablet on 01/09/14 at [facility] 9:30 AM. NPO 3 hours prior([name]). Patient notified of appointment date, time and instructions.

## 2014-01-08 ENCOUNTER — Encounter: Payer: Self-pay | Admitting: Internal Medicine

## 2014-01-09 ENCOUNTER — Ambulatory Visit (HOSPITAL_COMMUNITY)
Admission: RE | Admit: 2014-01-09 | Discharge: 2014-01-09 | Disposition: A | Discharge status: Home / Self Care | Payer: Medicare Other | Source: Ambulatory Visit | Attending: Gastroenterology | Admitting: Gastroenterology

## 2014-01-09 ENCOUNTER — Encounter (HOSPITAL_COMMUNITY): Payer: Self-pay | Admitting: Internal Medicine

## 2014-01-09 DIAGNOSIS — K219 Gastro-esophageal reflux disease without esophagitis: Secondary | ICD-10-CM | POA: Insufficient documentation

## 2014-01-09 DIAGNOSIS — H818X9 Other disorders of vestibular function, unspecified ear: Secondary | ICD-10-CM | POA: Insufficient documentation

## 2014-01-09 DIAGNOSIS — R1310 Dysphagia, unspecified: Secondary | ICD-10-CM | POA: Diagnosis present

## 2014-01-09 DIAGNOSIS — R1314 Dysphagia, pharyngoesophageal phase: Secondary | ICD-10-CM

## 2014-01-14 ENCOUNTER — Other Ambulatory Visit: Payer: Self-pay | Admitting: *Deleted

## 2014-01-14 DIAGNOSIS — R1314 Dysphagia, pharyngoesophageal phase: Secondary | ICD-10-CM

## 2014-01-15 ENCOUNTER — Other Ambulatory Visit (HOSPITAL_COMMUNITY): Payer: Self-pay | Admitting: Gastroenterology

## 2014-01-15 ENCOUNTER — Other Ambulatory Visit (HOSPITAL_COMMUNITY): Payer: Self-pay | Admitting: *Deleted

## 2014-01-23 ENCOUNTER — Other Ambulatory Visit (HOSPITAL_COMMUNITY): Payer: Self-pay | Admitting: Gastroenterology

## 2014-01-23 DIAGNOSIS — R1310 Dysphagia, unspecified: Secondary | ICD-10-CM

## 2014-01-28 ENCOUNTER — Other Ambulatory Visit (HOSPITAL_COMMUNITY): Payer: Medicare Other

## 2014-01-28 ENCOUNTER — Ambulatory Visit (HOSPITAL_COMMUNITY)
Admission: RE | Admit: 2014-01-28 | Discharge: 2014-01-28 | Disposition: A | Discharge status: Home / Self Care | Payer: Medicare Other | Source: Ambulatory Visit | Attending: Gastroenterology | Admitting: Gastroenterology

## 2014-02-07 ENCOUNTER — Ambulatory Visit (HOSPITAL_COMMUNITY)
Admission: RE | Admit: 2014-02-07 | Discharge: 2014-02-07 | Disposition: A | Discharge status: Home / Self Care | Payer: Medicare Other | Source: Ambulatory Visit | Attending: Gastroenterology | Admitting: Gastroenterology

## 2014-02-07 DIAGNOSIS — R1310 Dysphagia, unspecified: Secondary | ICD-10-CM | POA: Insufficient documentation

## 2014-02-07 DIAGNOSIS — R531 Weakness: Secondary | ICD-10-CM | POA: Diagnosis not present

## 2014-02-07 NOTE — Procedures (Signed)
Objective Swallowing Evaluation: Modified Barium Swallowing Study  Patient Details  Name: [name] MRN: [id number] Date of Birth: Aug 28, 1932  Today's Date: 02/07/2014 Time: 1306-1340 SLP Time Calculation (min) (ACUTE ONLY): 34 min  Past Medical History:  Past Medical History  Diagnosis Date  . Chronic back pain     "top of neck to lower back"  . Ischemic cardiomyopathy     WITH CHF  . CHF (congestive heart failure)     EF 35-40% s/p most recent ICD generator change-out with [employer] dual-chamber ICD 05/20/11 with explantation of previous abdominally-implanted device  . Hypertension   . Dyslipidemia   . Erythrocytosis   . GERD (gastroesophageal reflux disease)   . Abnormal thyroid scan     Abnormal thyroid imaging studies from 11/09/2010, status post ultrasound guided fine needle aspiration of the dominant left inferior thyroid nodule on 12/15/2010. Cytology report showed rare follicular epithelial cells and hemosiderin laden macrophages.  . Headache(784.0)   . Atrial fibrillation     on chronic Coumadin; stopped July 2013 due to subdural hematomas  . VT (ventricular tachycardia)   . ICD (implantable cardiac defibrillator) in place   . Heart murmur   . Myocardial infarction 1983; ~ 1990  . Shortness of breath     "once in awhile when I'm relaxing"  . Asthma   . Diabetes mellitus     diet controlled  . Arthritis     "all over"  . Family history of anesthesia complication   . Subdural hematoma July 2013    Anticoagulation stopped.   . Pneumonia August 2013  . Monocytosis 04/17/2013  . Wears glasses   . 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 Coronary artery disease     s/p CABG 1983 and PCI/stent 2004.   . Anginal pain    Past Surgical History:  Past Surgical History  Procedure Laterality Date  . Cardiac defibrillator placement      replaced April, 2013  . Coronary artery bypass graft  1983  . Cholecystectomy    . Esophagogastroduodenoscopy  02/11/2011    Procedure:  ESOPHAGOGASTRODUODENOSCOPY (EGD);  Surgeon: Beryle Beams, MD;  Location: [facility];  Service: Endoscopy;  Laterality: N/A;  . Colonoscopy  07/07/2011    Procedure: COLONOSCOPY;  Surgeon: Jerene Bears, MD;  Location: [facility];  Service: Gastroenterology;  Laterality: N/A;  . Tonsillectomy      "done when I was a kid"  . Coronary angioplasty with stent placement  10/17/06    "first and only"  . Knee arthroscopy      right; "just went in and scraped it"  . Mass excision Right 05/10/2013    Procedure: EXCISION MASS RIGHT THUMB;  Surgeon: Wynonia Sours, MD;  Location: [place];  Service: Orthopedics;  Laterality: Right;  . Proximal interphalangeal fusion (pip) Right 05/10/2013    Procedure: DEBRIDEMENT PROXIMAL INTERPHALANGEAL FUSION (PIP);  Surgeon: Wynonia Sours, MD;  Location: [place];  Service: Orthopedics;  Laterality: Right;  . Implantable cardioverter defibrillator (icd) generator change N/A 05/20/2011    Procedure: ICD GENERATOR CHANGE;  Surgeon: Evans Lance, MD;  Location: [facility];  Service: Cardiovascular;  Laterality: N/A;   HPI:  81 year old male seen for outpatient MBS with PMH: CHF, HTN,GERD, ICD, A-fib, DM, asthma, pna (09/2011), SDH 2013, CABG 1983. Referred for MBS after silent aspiration observed during barium esophagram as well as moderate-severe gastroesophageal reflux. Per MD office visit, pt 01/09/14 with difficulty with "crumbly/dry  foods" with coughing.  Pt reports issues with phlegm accumulation in the throat requiring him to "sniff" to clear (swallow or expectorate).  He also admits to avoidinig crumbly foods that cause difficulties for him wiht swallowing.       Assessment / Plan / Recommendation Clinical Impression  Dysphagia Diagnosis: Mild oral phase dysphagia;Mild pharyngeal phase dysphagia;Mild cervical esophageal phase dysphagia  Clinical impression: Mild oropharyngeal and cervical esophageal phase dysphagia with  sensorimotor impairments noted.  Pharyngeal residuals noted across consistencies that liquid swallow help to clear but with resultant trace aspiration of liquids.  Breath hold did not prevent aspiration but cued throat clearing/cough effective and aspiration.   Pt does not always sense pharyngeal stasis and cued expectoration effective to remove residuals from vallecular space.  Pt also with mild residuals at pyriform sinus with liquids without awareness.  Cued dry swallows also aid clearance but head turn right worsened clearance.    Pt reports his weight has been stable and he has not had pna since 2013, therefore tolerance of po evident.  Recommend continue diet with implementation of aspiration precautions for mitigation.  Thanks for this referral.  Using live video, verbal feedback, teach back and written communication - all education completed with this impressive pt.  Thanks for this referral.     Treatment Recommendation    n/a   Diet Recommendation Thin liquid;Regular (softer foods)   Liquid Administration via: Cup Medication Administration: Whole meds with puree (start and follow with water) Supervision: Patient able to self feed Compensations: Slow rate;Small sips/bites;Follow solids with liquid;Clear throat intermittently (intermittent cough/intermittent dry swallow, expectorate at end of meal/snack to assure pharynx is clear) Postural Changes and/or Swallow Maneuvers: Seated upright 90 degrees;Upright 30-60 min after meal    Other  Recommendations Oral Care Recommendations: Oral care BID   Follow Up Recommendations  None      General Date of Onset: 02/07/14 HPI: 81 year old male seen for outpatient MBS with PMH: CHF, HTN,GERD, ICD, A-fib, DM, asthma, pna (09/2011), SDH 2013, CABG 1983. Referred for MBS after silent aspiration observed during barium esophagram as well as moderate-severe gastroesophageal reflux. Per MD office visit, pt 01/09/14 with difficulty with "crumbly/dry foods"  with coughing.  Pt reports issues with phlegm accumulation in the throat requiring him to "sniff" to clear (swallow or expectorate).  He also admits to avoidinig crumbly foods that cause difficulties for him wiht swallowing.   Type of Study: Modified Barium Swallowing Study Reason for Referral: Objectively evaluate swallowing function Diet Prior to this Study: Regular;Thin liquids Temperature Spikes Noted: No Respiratory Status: Room air History of Recent Intubation: No Behavior/Cognition: Alert;Cooperative;Pleasant mood Oral Cavity - Dentition: Edentulous (pt reports being edentulous for 20 years and he does not wear his dentures due to gagging and secretion expectoration) Oral Motor / Sensory Function: Within functional limits Self-Feeding Abilities: Able to feed self Patient Positioning: Upright in chair Baseline Vocal Quality: Clear Volitional Cough: Strong Volitional Swallow: Able to elicit Anatomy: Other (Comment) (appearance of cervical spine nearly fused impacting head mobility) Pharyngeal Secretions: Standing secretions in (comment) (mixed with barium )    Reason for Referral Objectively evaluate swallowing function   Oral Phase Oral Preparation/Oral Phase Oral Phase: Impaired Oral - Nectar Oral - Nectar Teaspoon: Piecemeal swallowing;Within functional limits Oral - Nectar Cup: Piecemeal swallowing;Within functional limits Oral - Thin Oral - Thin Teaspoon: Piecemeal swallowing;Within functional limits Oral - Thin Cup: Piecemeal swallowing;Within functional limits Oral - Solids Oral - Puree: Within functional limits Oral - Regular:  Within functional limits Oral Phase - Comment Oral Phase - Comment: pt piecemeals liquids which is likely compensatory (self created strategy)   Pharyngeal Phase Pharyngeal Phase Pharyngeal Phase: Impaired Pharyngeal - Nectar Pharyngeal - Nectar Teaspoon: Reduced epiglottic inversion;Reduced tongue base retraction;Penetration/Aspiration during  swallow;Pharyngeal residue - valleculae;Pharyngeal residue - pyriform sinuses;Reduced anterior laryngeal mobility;Reduced laryngeal elevation Penetration/Aspiration details (nectar teaspoon): Material enters airway, remains ABOVE vocal cords and not ejected out Pharyngeal - Nectar Cup: Reduced epiglottic inversion;Reduced tongue base retraction;Pharyngeal residue - valleculae;Pharyngeal residue - pyriform sinuses;Reduced anterior laryngeal mobility;Reduced laryngeal elevation;Penetration/Aspiration during swallow Penetration/Aspiration details (nectar cup): Material enters airway, passes BELOW cords without attempt by patient to eject out (silent aspiration) Pharyngeal - Thin Pharyngeal - Thin Teaspoon: Reduced epiglottic inversion;Reduced tongue base retraction;Pharyngeal residue - valleculae;Pharyngeal residue - pyriform sinuses;Reduced anterior laryngeal mobility;Reduced laryngeal elevation Pharyngeal - Thin Cup: Reduced epiglottic inversion;Reduced tongue base retraction;Pharyngeal residue - valleculae;Pharyngeal residue - pyriform sinuses;Penetration/Aspiration during swallow;Reduced anterior laryngeal mobility;Reduced laryngeal elevation Penetration/Aspiration details (thin cup): Material enters airway, passes BELOW cords without attempt by patient to eject out (silent aspiration);Material enters airway, CONTACTS cords then ejected out Pharyngeal - Solids Pharyngeal - Puree: Reduced epiglottic inversion;Reduced tongue base retraction;Premature spillage to valleculae;Pharyngeal residue - valleculae;Reduced anterior laryngeal mobility;Reduced laryngeal elevation Pharyngeal - Regular: Reduced epiglottic inversion;Reduced tongue base retraction;Premature spillage to valleculae;Pharyngeal residue - valleculae;Reduced anterior laryngeal mobility;Reduced laryngeal elevation Pharyngeal Phase - Comment Pharyngeal Comment: liquid swallow faciliates clearance of solid residuals, pt does not always sense  pharyngeal stasis and cued expectoration effective to remove residuals from vallecular space that pt does not sense, head turn right did not faciliate epiglottic deflection and worsened swallow, chin tuck unable to be performed due to cervical spine deficits  Cervical Esophageal Phase    GO    Cervical Esophageal Phase Cervical Esophageal Phase: Impaired Cervical Esophageal Phase - Comment Cervical Esophageal Comment: appearance of impaired bolus flow through UES - suspect cervical spine slightly impinges and decreased laryngeal elevation results in decreased musculature UES opening    Functional Assessment Tool Used: mbs. clinical judgement Functional Limitations: Swallowing Swallow Current Status (T1173): At least 1 percent but less than 20 percent impaired, limited or restricted Swallow Goal Status [phone]): At least 1 percent but less than 20 percent impaired, limited or restricted Swallow Discharge Status [phone]): At least 1 percent but less than 20 percent impaired, limited or restricted   Luanna Salk, [place] [facility] SLP [phone]

## 2014-02-14 DIAGNOSIS — N183 Chronic kidney disease, stage 3 (moderate): Secondary | ICD-10-CM | POA: Diagnosis not present

## 2014-02-14 DIAGNOSIS — I509 Heart failure, unspecified: Secondary | ICD-10-CM | POA: Diagnosis not present

## 2014-02-14 DIAGNOSIS — Z1389 Encounter for screening for other disorder: Secondary | ICD-10-CM | POA: Diagnosis not present

## 2014-02-14 DIAGNOSIS — E1129 Type 2 diabetes mellitus with other diabetic kidney complication: Secondary | ICD-10-CM | POA: Diagnosis not present

## 2014-02-14 DIAGNOSIS — M5010 Cervical disc disorder with radiculopathy, unspecified cervical region: Secondary | ICD-10-CM | POA: Diagnosis not present

## 2014-02-14 DIAGNOSIS — Z6827 Body mass index (BMI) 27.0-27.9, adult: Secondary | ICD-10-CM | POA: Diagnosis not present

## 2014-03-12 ENCOUNTER — Telehealth: Payer: Self-pay | Admitting: Cardiology

## 2014-03-12 ENCOUNTER — Other Ambulatory Visit: Payer: Self-pay

## 2014-03-12 MED ORDER — LISINOPRIL 10 MG PO TABS: 10.0000 mg | ORAL_TABLET | Freq: Every day | ORAL | Status: DC

## 2014-03-12 NOTE — Telephone Encounter (Signed)
Pt still have not received his Lisinopril. Please call this in today to [phone].

## 2014-03-17 ENCOUNTER — Ambulatory Visit: Payer: Medicare Other | Admitting: Cardiology

## 2014-03-20 ENCOUNTER — Ambulatory Visit (INDEPENDENT_AMBULATORY_CARE_PROVIDER_SITE_OTHER): Payer: Medicare Other | Admitting: *Deleted

## 2014-03-20 DIAGNOSIS — I4729 Other ventricular tachycardia: Secondary | ICD-10-CM

## 2014-03-20 DIAGNOSIS — I472 Ventricular tachycardia: Secondary | ICD-10-CM | POA: Diagnosis not present

## 2014-03-20 DIAGNOSIS — I5022 Chronic systolic (congestive) heart failure: Secondary | ICD-10-CM

## 2014-03-20 NOTE — Progress Notes (Signed)
Remote ICD transmission.   

## 2014-03-22 LAB — MDC_IDC_ENUM_SESS_TYPE_REMOTE
Brady Statistic AP VP Percent: 0.06 %
Brady Statistic AP VS Percent: 0.04 %
Brady Statistic AS VP Percent: 21.25 %
Brady Statistic AS VS Percent: 78.65 %
Brady Statistic RA Percent Paced: 0.1 %
Brady Statistic RV Percent Paced: 21.31 %
Date Time Interrogation Session: 20160218153927
HIGH POWER IMPEDANCE MEASURED VALUE: 49 Ohm
HighPow Impedance: 42 Ohm
Lead Channel Impedance Value: 437 Ohm
Lead Channel Pacing Threshold Amplitude: 0.75 V
Lead Channel Pacing Threshold Pulse Width: 0.4 ms
Lead Channel Pacing Threshold Pulse Width: 0.4 ms
Lead Channel Sensing Intrinsic Amplitude: 0.875 mV
Lead Channel Sensing Intrinsic Amplitude: 0.875 mV
Lead Channel Sensing Intrinsic Amplitude: 11.875 mV
Lead Channel Setting Pacing Amplitude: 2 V
Lead Channel Setting Pacing Pulse Width: 0.4 ms
MDC IDC MSMT BATTERY VOLTAGE: 3.01 V
MDC IDC MSMT LEADCHNL RV IMPEDANCE VALUE: 494 Ohm
MDC IDC MSMT LEADCHNL RV PACING THRESHOLD AMPLITUDE: 0.75 V
MDC IDC MSMT LEADCHNL RV SENSING INTR AMPL: 11.875 mV
MDC IDC SET LEADCHNL RV PACING AMPLITUDE: 2.5 V
MDC IDC SET LEADCHNL RV SENSING SENSITIVITY: 0.3 mV
MDC IDC SET ZONE DETECTION INTERVAL: 450 ms
Zone Setting Detection Interval: 300 ms
Zone Setting Detection Interval: 330 ms
Zone Setting Detection Interval: 350 ms

## 2014-03-28 ENCOUNTER — Encounter: Payer: Self-pay | Admitting: Cardiology

## 2014-04-03 ENCOUNTER — Encounter: Payer: Self-pay | Admitting: Internal Medicine

## 2014-04-16 ENCOUNTER — Other Ambulatory Visit (HOSPITAL_BASED_OUTPATIENT_CLINIC_OR_DEPARTMENT_OTHER): Payer: Medicare Other

## 2014-04-16 ENCOUNTER — Encounter: Payer: Self-pay | Admitting: Hematology and Oncology

## 2014-04-16 ENCOUNTER — Telehealth: Payer: Self-pay | Admitting: *Deleted

## 2014-04-16 ENCOUNTER — Telehealth: Payer: Self-pay | Admitting: Hematology and Oncology

## 2014-04-16 ENCOUNTER — Ambulatory Visit (HOSPITAL_BASED_OUTPATIENT_CLINIC_OR_DEPARTMENT_OTHER): Payer: Medicare Other | Admitting: Hematology and Oncology

## 2014-04-16 VITALS — BP 103/80 | HR 70 | Temp 97.7°F | Resp 18 | Ht 69.0 in | Wt 194.3 lb

## 2014-04-16 DIAGNOSIS — L989 Disorder of the skin and subcutaneous tissue, unspecified: Secondary | ICD-10-CM | POA: Diagnosis not present

## 2014-04-16 DIAGNOSIS — D509 Iron deficiency anemia, unspecified: Secondary | ICD-10-CM

## 2014-04-16 DIAGNOSIS — D72821 Monocytosis (symptomatic): Secondary | ICD-10-CM | POA: Diagnosis not present

## 2014-04-16 LAB — CBC & DIFF AND RETIC
BASO%: 0.1 % (ref 0.0–2.0)
Basophils Absolute: 0 10*3/uL (ref 0.0–0.1)
EOS%: 1 % (ref 0.0–7.0)
Eosinophils Absolute: 0.1 10*3/uL (ref 0.0–0.5)
HEMATOCRIT: 41.5 % (ref 38.4–49.9)
HGB: 13.7 g/dL (ref 13.0–17.1)
IMMATURE RETIC FRACT: 7.9 % (ref 3.00–10.60)
LYMPH%: 25.8 % (ref 14.0–49.0)
MCH: 30.2 pg (ref 27.2–33.4)
MCHC: 33 g/dL (ref 32.0–36.0)
MCV: 91.4 fL (ref 79.3–98.0)
MONO#: 1.8 10*3/uL — ABNORMAL HIGH (ref 0.1–0.9)
MONO%: 25.8 % — AB (ref 0.0–14.0)
NEUT%: 47.3 % (ref 39.0–75.0)
NEUTROS ABS: 3.2 10*3/uL (ref 1.5–6.5)
Platelets: 146 10*3/uL (ref 140–400)
RBC: 4.54 10*6/uL (ref 4.20–5.82)
RDW: 14.2 % (ref 11.0–14.6)
RETIC CT ABS: 70.37 10*3/uL (ref 34.80–93.90)
Retic %: 1.55 % (ref 0.80–1.80)
WBC: 6.8 10*3/uL (ref 4.0–10.3)
lymph#: 1.8 10*3/uL (ref 0.9–3.3)

## 2014-04-16 LAB — FERRITIN CHCC: FERRITIN: 30 ng/mL (ref 22–316)

## 2014-04-16 NOTE — Telephone Encounter (Signed)
[name] called to ask if we checked him magnesium level.  Told him we did not.  He read package insert on Dexilant which states it can cause low magnesium and he was curious.  His PCP prescribes the [place].  He said he would discuss that when his PCP at his next visit.

## 2014-04-16 NOTE — Telephone Encounter (Signed)
Gave avs & calendar for March 2017. °

## 2014-04-17 DIAGNOSIS — L989 Disorder of the skin and subcutaneous tissue, unspecified: Secondary | ICD-10-CM | POA: Insufficient documentation

## 2014-04-17 DIAGNOSIS — K219 Gastro-esophageal reflux disease without esophagitis: Secondary | ICD-10-CM | POA: Diagnosis not present

## 2014-04-17 DIAGNOSIS — I480 Paroxysmal atrial fibrillation: Secondary | ICD-10-CM | POA: Diagnosis not present

## 2014-04-17 DIAGNOSIS — I255 Ischemic cardiomyopathy: Secondary | ICD-10-CM | POA: Diagnosis not present

## 2014-04-17 DIAGNOSIS — I259 Chronic ischemic heart disease, unspecified: Secondary | ICD-10-CM | POA: Diagnosis not present

## 2014-04-17 DIAGNOSIS — Z6827 Body mass index (BMI) 27.0-27.9, adult: Secondary | ICD-10-CM | POA: Diagnosis not present

## 2014-04-17 DIAGNOSIS — K5900 Constipation, unspecified: Secondary | ICD-10-CM | POA: Diagnosis not present

## 2014-04-17 NOTE — Assessment & Plan Note (Signed)
He has a skin lesion on his forehead that has been present for many months. That could be a form of skin cancer. I recommend he follows close to his PCP with possible referral to dermatologist for skin biopsu if that does not resolve within the next month or so.

## 2014-04-17 NOTE — Assessment & Plan Note (Signed)
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 Have chronic monocytosis for many years but he is not symptomatic. I will continue to see him on a yearly basis for monitoring.
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
## 2014-05-19 ENCOUNTER — Ambulatory Visit (INDEPENDENT_AMBULATORY_CARE_PROVIDER_SITE_OTHER): Payer: Medicare Other | Admitting: Cardiology

## 2014-05-19 ENCOUNTER — Encounter: Payer: Self-pay | Admitting: Cardiology

## 2014-05-19 VITALS — BP 100/60 | HR 75 | Ht 69.0 in | Wt 194.0 lb

## 2014-05-19 DIAGNOSIS — K219 Gastro-esophageal reflux disease without esophagitis: Secondary | ICD-10-CM

## 2014-05-19 DIAGNOSIS — I482 Chronic atrial fibrillation, unspecified: Secondary | ICD-10-CM

## 2014-05-19 DIAGNOSIS — I4891 Unspecified atrial fibrillation: Secondary | ICD-10-CM

## 2014-05-19 DIAGNOSIS — I2510 Atherosclerotic heart disease of native coronary artery without angina pectoris: Secondary | ICD-10-CM | POA: Diagnosis not present

## 2014-05-19 DIAGNOSIS — I5022 Chronic systolic (congestive) heart failure: Secondary | ICD-10-CM | POA: Diagnosis not present

## 2014-05-19 DIAGNOSIS — R0602 Shortness of breath: Secondary | ICD-10-CM | POA: Diagnosis not present

## 2014-05-19 NOTE — Patient Instructions (Signed)
Continue your current therapy  I will see you in 6 months.  Continue sodium restriction
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
## 2014-06-03 ENCOUNTER — Telehealth: Payer: Self-pay | Admitting: Cardiology

## 2014-06-03 MED ORDER — ROSUVASTATIN CALCIUM 5 MG PO TABS: ORAL_TABLET | ORAL | Status: DC

## 2014-06-03 NOTE — Telephone Encounter (Signed)
E-sent rx to [facility] [address] Patient request 13 tablets instead of 12 tablets. Patient aware medication e-sent to pharmacy

## 2014-06-03 NOTE — Telephone Encounter (Signed)
°  1. Which medications need to be refilled? Crestor  2. Which pharmacy is medication to be sent to?[facility] on [address]   3. Do they need a 30 day or 90 day supply? 30  4. Would they like a call back once the medication has been sent to the pharmacy? Yes

## 2014-06-06 DIAGNOSIS — R946 Abnormal results of thyroid function studies: Secondary | ICD-10-CM | POA: Diagnosis not present

## 2014-06-06 DIAGNOSIS — Z6827 Body mass index (BMI) 27.0-27.9, adult: Secondary | ICD-10-CM | POA: Diagnosis not present

## 2014-06-06 DIAGNOSIS — K219 Gastro-esophageal reflux disease without esophagitis: Secondary | ICD-10-CM | POA: Diagnosis not present

## 2014-06-06 DIAGNOSIS — I480 Paroxysmal atrial fibrillation: Secondary | ICD-10-CM | POA: Diagnosis not present

## 2014-06-06 DIAGNOSIS — J449 Chronic obstructive pulmonary disease, unspecified: Secondary | ICD-10-CM | POA: Diagnosis not present

## 2014-06-06 DIAGNOSIS — I509 Heart failure, unspecified: Secondary | ICD-10-CM | POA: Diagnosis not present

## 2014-06-06 DIAGNOSIS — R260 Ataxic gait: Secondary | ICD-10-CM | POA: Diagnosis not present

## 2014-06-06 DIAGNOSIS — E1129 Type 2 diabetes mellitus with other diabetic kidney complication: Secondary | ICD-10-CM | POA: Diagnosis not present

## 2014-06-06 DIAGNOSIS — N183 Chronic kidney disease, stage 3 (moderate): Secondary | ICD-10-CM | POA: Diagnosis not present

## 2014-06-19 ENCOUNTER — Ambulatory Visit (INDEPENDENT_AMBULATORY_CARE_PROVIDER_SITE_OTHER): Payer: Medicare Other | Admitting: *Deleted

## 2014-06-19 DIAGNOSIS — I5022 Chronic systolic (congestive) heart failure: Secondary | ICD-10-CM | POA: Diagnosis not present

## 2014-06-19 DIAGNOSIS — I472 Ventricular tachycardia: Secondary | ICD-10-CM | POA: Diagnosis not present

## 2014-06-19 DIAGNOSIS — I4729 Other ventricular tachycardia: Secondary | ICD-10-CM

## 2014-06-19 NOTE — Progress Notes (Signed)
Remote ICD transmission.   

## 2014-06-24 LAB — CUP PACEART REMOTE DEVICE CHECK
Brady Statistic AP VP Percent: 0.04 %
Brady Statistic AP VS Percent: 0.03 %
Brady Statistic AS VP Percent: 17.29 %
Brady Statistic AS VS Percent: 82.64 %
Brady Statistic RV Percent Paced: 17.33 %
Date Time Interrogation Session: 20160519142649
HighPow Impedance: 41 Ohm
HighPow Impedance: 47 Ohm
Lead Channel Impedance Value: 437 Ohm
Lead Channel Impedance Value: 494 Ohm
Lead Channel Pacing Threshold Amplitude: 0.75 V
Lead Channel Pacing Threshold Pulse Width: 0.4 ms
Lead Channel Sensing Intrinsic Amplitude: 0.875 mV
Lead Channel Sensing Intrinsic Amplitude: 0.875 mV
Lead Channel Sensing Intrinsic Amplitude: 10.75 mV
Lead Channel Setting Pacing Amplitude: 2 V
Lead Channel Setting Pacing Amplitude: 2.5 V
Lead Channel Setting Sensing Sensitivity: 0.3 mV
MDC IDC MSMT BATTERY VOLTAGE: 2.98 V
MDC IDC MSMT LEADCHNL RA PACING THRESHOLD PULSEWIDTH: 0.4 ms
MDC IDC MSMT LEADCHNL RV PACING THRESHOLD AMPLITUDE: 0.75 V
MDC IDC MSMT LEADCHNL RV SENSING INTR AMPL: 10.75 mV
MDC IDC SET LEADCHNL RV PACING PULSEWIDTH: 0.4 ms
MDC IDC SET ZONE DETECTION INTERVAL: 450 ms
MDC IDC STAT BRADY RA PERCENT PACED: 0.07 %
Zone Setting Detection Interval: 300 ms
Zone Setting Detection Interval: 330 ms
Zone Setting Detection Interval: 350 ms

## 2014-07-02 ENCOUNTER — Encounter: Payer: Self-pay | Admitting: Cardiology

## 2014-07-09 ENCOUNTER — Encounter: Payer: Self-pay | Admitting: Internal Medicine

## 2014-08-22 ENCOUNTER — Encounter: Payer: Self-pay | Admitting: Internal Medicine

## 2014-08-22 ENCOUNTER — Ambulatory Visit (INDEPENDENT_AMBULATORY_CARE_PROVIDER_SITE_OTHER): Payer: Medicare Other | Admitting: Internal Medicine

## 2014-08-22 VITALS — BP 126/64 | HR 71 | Ht 69.0 in | Wt 195.2 lb

## 2014-08-22 DIAGNOSIS — I4729 Other ventricular tachycardia: Secondary | ICD-10-CM

## 2014-08-22 DIAGNOSIS — I482 Chronic atrial fibrillation, unspecified: Secondary | ICD-10-CM

## 2014-08-22 DIAGNOSIS — I472 Ventricular tachycardia: Secondary | ICD-10-CM | POA: Diagnosis not present

## 2014-08-22 DIAGNOSIS — I4891 Unspecified atrial fibrillation: Secondary | ICD-10-CM | POA: Diagnosis not present

## 2014-08-22 DIAGNOSIS — I2510 Atherosclerotic heart disease of native coronary artery without angina pectoris: Secondary | ICD-10-CM

## 2014-08-22 DIAGNOSIS — I5022 Chronic systolic (congestive) heart failure: Secondary | ICD-10-CM

## 2014-08-22 DIAGNOSIS — Z95810 Presence of automatic (implantable) cardiac defibrillator: Secondary | ICD-10-CM | POA: Diagnosis not present

## 2014-08-22 LAB — CUP PACEART INCLINIC DEVICE CHECK
Battery Voltage: 2.97 V
Brady Statistic AP VP Percent: 0.05 %
Brady Statistic AS VP Percent: 18.67 %
Brady Statistic RA Percent Paced: 0.08 %
Brady Statistic RV Percent Paced: 18.72 %
HIGH POWER IMPEDANCE MEASURED VALUE: 45 Ohm
HighPow Impedance: 54 Ohm
Lead Channel Impedance Value: 475 Ohm
Lead Channel Impedance Value: 532 Ohm
Lead Channel Pacing Threshold Amplitude: 0.625 V
Lead Channel Pacing Threshold Amplitude: 0.75 V
Lead Channel Pacing Threshold Pulse Width: 0.4 ms
Lead Channel Pacing Threshold Pulse Width: 0.4 ms
Lead Channel Sensing Intrinsic Amplitude: 0.75 mV
Lead Channel Sensing Intrinsic Amplitude: 1 mV
Lead Channel Sensing Intrinsic Amplitude: 13.625 mV
Lead Channel Setting Pacing Amplitude: 2 V
Lead Channel Setting Sensing Sensitivity: 0.3 mV
MDC IDC MSMT LEADCHNL RV SENSING INTR AMPL: 11 mV
MDC IDC SESS DTM: 20160722130703
MDC IDC SET LEADCHNL RV PACING AMPLITUDE: 2.5 V
MDC IDC SET LEADCHNL RV PACING PULSEWIDTH: 0.4 ms
MDC IDC SET ZONE DETECTION INTERVAL: 300 ms
MDC IDC SET ZONE DETECTION INTERVAL: 350 ms
MDC IDC STAT BRADY AP VS PERCENT: 0.03 %
MDC IDC STAT BRADY AS VS PERCENT: 81.25 %
Zone Setting Detection Interval: 330 ms
Zone Setting Detection Interval: 450 ms

## 2014-08-22 NOTE — Assessment & Plan Note (Signed)
He will continue his current meds. He has had no VT. Will follow.

## 2014-08-22 NOTE — Progress Notes (Signed)
HPI Mr. Schuenemann returns today for followup.he is a very pleasant 81 year old man with a history of chronic atrial fibrillation, symptomatic tachycardia bradycardia syndrome, VT, status post ICD implantation.  He has been on amiodarone but has chronic atrial fib and his amio was discontinued months ago. He uses Lasix for control of his peripheral edema. He notes an occaisional "jerking sensation" in his legs.  Allergies  Allergen Reactions  . Celebrex [Celecoxib] Hives and Other (See Comments)    Gi upset  . Esomeprazole Magnesium Hives    Other reaction(s): Hives "don't really remember"  . Tamsulosin Other (See Comments)    Dizziness, Made BP very low and weakness  . Dronedarone     Other reaction(s): GI upset, abdominal pain  . Digoxin Other (See Comments)    Unknown   . Hydrocodone-Acetaminophen Other (See Comments)    Bad headache  . Protonix [Pantoprazole Sodium] Nausea And Vomiting    Tolerates Dexilant        Past Medical History  Diagnosis Date  . Chronic back pain     "top of neck to lower back"  . Ischemic cardiomyopathy     WITH CHF  . CHF (congestive heart failure)     EF 35-40% s/p most recent ICD generator change-out with Medtronic dual-chamber ICD 05/20/11 with explantation of previous abdominally-implanted device  . Hypertension   . Dyslipidemia   . Erythrocytosis   . GERD (gastroesophageal reflux disease)   . Abnormal thyroid scan     Abnormal thyroid imaging studies from 11/09/2010, status post ultrasound guided fine needle aspiration of the dominant left inferior thyroid nodule on 12/15/2010. Cytology report showed rare follicular epithelial cells and hemosiderin laden macrophages.  . Headache(784.0)   . Atrial fibrillation     on chronic Coumadin; stopped July 2013 due to subdural hematomas  . VT (ventricular tachycardia)   . ICD (implantable cardiac defibrillator) in place   . Heart murmur   . Myocardial infarction 1983; ~ 1990  . Shortness of breath      "once in awhile when I'm relaxing"  . Asthma   . Diabetes mellitus     diet controlled  . Arthritis     "all over"  . Family history of anesthesia complication   . Subdural hematoma July 2013    Anticoagulation stopped.   . Pneumonia August 2013  . Monocytosis 04/17/2013  . Wears glasses   . 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 Coronary artery disease     s/p CABG 1983 and PCI/stent 2004.   . Anginal pain     ROS:   All systems reviewed and negative except as noted in the HPI.   Past Surgical History  Procedure Laterality Date  . Cardiac defibrillator placement      replaced April, 2013  . Coronary artery bypass graft  1983  . Cholecystectomy    . Esophagogastroduodenoscopy  02/11/2011    Procedure: ESOPHAGOGASTRODUODENOSCOPY (EGD);  Surgeon: Beryle Beams, MD;  Location: [facility];  Service: Endoscopy;  Laterality: N/A;  . Colonoscopy  07/07/2011    Procedure: COLONOSCOPY;  Surgeon: Jerene Bears, MD;  Location: [facility];  Service: Gastroenterology;  Laterality: N/A;  . Tonsillectomy      "done when I was a kid"  . Coronary angioplasty with stent placement  10/17/06    "first and only"  . Knee arthroscopy      right; "just went in and scraped it"  . Mass excision Right 05/10/2013    Procedure: EXCISION  MASS RIGHT THUMB;  Surgeon: Wynonia Sours, MD;  Location: [place];  Service: Orthopedics;  Laterality: Right;  . Proximal interphalangeal fusion (pip) Right 05/10/2013    Procedure: DEBRIDEMENT PROXIMAL INTERPHALANGEAL FUSION (PIP);  Surgeon: Wynonia Sours, MD;  Location: [place];  Service: Orthopedics;  Laterality: Right;  . Implantable cardioverter defibrillator (icd) generator change N/A 05/20/2011    Procedure: ICD GENERATOR CHANGE;  Surgeon: Evans Lance, MD;  Location: [facility] CATH LAB;  Service: Cardiovascular;  Laterality: N/A;     Family History  Problem Relation Age of Onset  . Heart disease Brother   . Diabetes Sister   . Diabetes  Brother   . Tuberculosis Mother   . Tuberculosis Father   . Clotting disorder Brother      History   Social History  . Marital Status: Divorced    Spouse Name: N/A  . Number of Children: 2  . Years of Education: N/A   Occupational History  . real estate    Social History Main Topics  . Smoking status: Former Smoker -- 2.00 packs/day for 30 years    Types: Cigarettes    Quit date: 06/23/1976  . Smokeless tobacco: Never Used  . Alcohol Use: Yes     Comment: 08/17/11 "used to drink a little bit; last drink was 4-5 years ago"  . Drug Use: No  . Sexual Activity: No   Other Topics Concern  . Not on file   Social History Narrative     BP 126/64 mmHg  Pulse 71  Ht 5\' 9"  (1.753 m)  Wt 195 lb 3.2 oz (88.542 kg)  BMI 28.81 kg/m2  Physical Exam:  Well appearing 81 year old man, NAD HEENT: Unremarkable Neck: 7 cm JVD, no thyromegally Lungs:  Clear with no wheezes, rales, or rhonchi. HEART:  Regular rate rhythm, no murmurs, no rubs, no clicks Abd:  soft, positive bowel sounds, no organomegally, no rebound, no guarding Ext:  2 plus pulses, no edema, no cyanosis, no clubbing Skin:  No rashes no nodules Neuro:  CN II through XII intact, motor grossly intact   DEVICE  Normal device function.  See PaceArt for details.   Assess/Plan:

## 2014-08-22 NOTE — Patient Instructions (Signed)
Medication Instructions:  Your physician recommends that you continue on your current medications as directed. Please refer to the Current Medication list given to you today.   Labwork: None ordered  Testing/Procedures: None ordered  Follow-Up: Your physician wants you to follow-up in: 12  Months with Dr Knox Saliva will receive a reminder letter in the mail two months in advance. If you don't receive a letter, please call our office to schedule the follow-up appointment.  .Remote monitoring is used to monitor your Pacemaker or ICD from home. This monitoring reduces the number of office visits required to check your device to one time per year. It allows [place] to keep an eye on the functioning of your device to ensure it is working properly. You are scheduled for a device check from home on 11/24/14. You may send your transmission at any time that day. If you have a wireless device, the transmission will be sent automatically. After your physician reviews your transmission, you will receive a postcard with your next transmission date.     Any Other Special Instructions Will Be Listed Below (If Applicable).

## 2014-08-22 NOTE — Assessment & Plan Note (Signed)
His ventricular rate is controlled. He will continue his current meds.

## 2014-09-01 ENCOUNTER — Other Ambulatory Visit: Payer: Self-pay

## 2014-09-01 MED ORDER — CARVEDILOL 12.5 MG PO TABS: ORAL_TABLET | ORAL | Status: DC

## 2014-09-08 ENCOUNTER — Other Ambulatory Visit: Payer: Self-pay | Admitting: *Deleted

## 2014-09-08 MED ORDER — LISINOPRIL 10 MG PO TABS: 10.0000 mg | ORAL_TABLET | Freq: Every day | ORAL | Status: DC

## 2014-09-22 DIAGNOSIS — D649 Anemia, unspecified: Secondary | ICD-10-CM | POA: Diagnosis not present

## 2014-09-22 DIAGNOSIS — E785 Hyperlipidemia, unspecified: Secondary | ICD-10-CM | POA: Diagnosis not present

## 2014-09-22 DIAGNOSIS — E119 Type 2 diabetes mellitus without complications: Secondary | ICD-10-CM | POA: Diagnosis not present

## 2014-09-22 DIAGNOSIS — E041 Nontoxic single thyroid nodule: Secondary | ICD-10-CM | POA: Diagnosis not present

## 2014-10-03 DIAGNOSIS — E1129 Type 2 diabetes mellitus with other diabetic kidney complication: Secondary | ICD-10-CM | POA: Diagnosis not present

## 2014-10-03 DIAGNOSIS — Z23 Encounter for immunization: Secondary | ICD-10-CM | POA: Diagnosis not present

## 2014-10-03 DIAGNOSIS — N183 Chronic kidney disease, stage 3 (moderate): Secondary | ICD-10-CM | POA: Diagnosis not present

## 2014-10-03 DIAGNOSIS — E785 Hyperlipidemia, unspecified: Secondary | ICD-10-CM | POA: Diagnosis not present

## 2014-10-03 DIAGNOSIS — J449 Chronic obstructive pulmonary disease, unspecified: Secondary | ICD-10-CM | POA: Diagnosis not present

## 2014-10-03 DIAGNOSIS — I255 Ischemic cardiomyopathy: Secondary | ICD-10-CM | POA: Diagnosis not present

## 2014-10-03 DIAGNOSIS — R260 Ataxic gait: Secondary | ICD-10-CM | POA: Diagnosis not present

## 2014-10-03 DIAGNOSIS — K5900 Constipation, unspecified: Secondary | ICD-10-CM | POA: Diagnosis not present

## 2014-10-03 DIAGNOSIS — Z6827 Body mass index (BMI) 27.0-27.9, adult: Secondary | ICD-10-CM | POA: Diagnosis not present

## 2014-10-03 DIAGNOSIS — R946 Abnormal results of thyroid function studies: Secondary | ICD-10-CM | POA: Diagnosis not present

## 2014-10-03 DIAGNOSIS — D649 Anemia, unspecified: Secondary | ICD-10-CM | POA: Diagnosis not present

## 2014-10-03 DIAGNOSIS — I2510 Atherosclerotic heart disease of native coronary artery without angina pectoris: Secondary | ICD-10-CM | POA: Diagnosis not present

## 2014-11-14 ENCOUNTER — Encounter: Payer: Self-pay | Admitting: *Deleted

## 2014-11-17 ENCOUNTER — Other Ambulatory Visit: Payer: Self-pay

## 2014-11-17 MED ORDER — FUROSEMIDE 40 MG PO TABS: 40.0000 mg | ORAL_TABLET | Freq: Every day | ORAL | Status: DC

## 2014-11-18 ENCOUNTER — Ambulatory Visit (INDEPENDENT_AMBULATORY_CARE_PROVIDER_SITE_OTHER): Payer: Medicare Other | Admitting: Cardiology

## 2014-11-18 ENCOUNTER — Encounter: Payer: Self-pay | Admitting: Cardiology

## 2014-11-18 VITALS — BP 120/70 | HR 88 | Ht 69.0 in | Wt 193.3 lb

## 2014-11-18 DIAGNOSIS — Z95810 Presence of automatic (implantable) cardiac defibrillator: Secondary | ICD-10-CM | POA: Diagnosis not present

## 2014-11-18 DIAGNOSIS — I482 Chronic atrial fibrillation, unspecified: Secondary | ICD-10-CM

## 2014-11-18 DIAGNOSIS — I5022 Chronic systolic (congestive) heart failure: Secondary | ICD-10-CM

## 2014-11-18 DIAGNOSIS — I472 Ventricular tachycardia: Secondary | ICD-10-CM

## 2014-11-18 DIAGNOSIS — I2510 Atherosclerotic heart disease of native coronary artery without angina pectoris: Secondary | ICD-10-CM | POA: Diagnosis not present

## 2014-11-18 DIAGNOSIS — I4729 Other ventricular tachycardia: Secondary | ICD-10-CM
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
## 2014-11-18 NOTE — Patient Instructions (Addendum)
Continue your current therapy  We will request a copy of your lab work from Dr. Dagmar Hait  I will see you in 6 months

## 2014-11-24 ENCOUNTER — Telehealth: Payer: Self-pay | Admitting: Cardiology

## 2014-11-24 ENCOUNTER — Ambulatory Visit (INDEPENDENT_AMBULATORY_CARE_PROVIDER_SITE_OTHER): Payer: Medicare Other | Admitting: *Deleted

## 2014-11-24 DIAGNOSIS — I5022 Chronic systolic (congestive) heart failure: Secondary | ICD-10-CM

## 2014-11-24 DIAGNOSIS — I4729 Other ventricular tachycardia: Secondary | ICD-10-CM

## 2014-11-24 DIAGNOSIS — I472 Ventricular tachycardia: Secondary | ICD-10-CM | POA: Diagnosis not present

## 2014-11-24 NOTE — Telephone Encounter (Signed)
Spoke with pt and reminded pt of remote transmission that is due today. Pt verbalized understanding.   

## 2014-11-24 NOTE — Progress Notes (Signed)
Remote ICD transmission.   

## 2014-11-25 LAB — CUP PACEART REMOTE DEVICE CHECK
Battery Voltage: 2.9 V
Brady Statistic AP VP Percent: 0.04 %
Brady Statistic AP VS Percent: 0.03 %
Brady Statistic AS VP Percent: 16.1 %
Brady Statistic RA Percent Paced: 0.07 %
Brady Statistic RV Percent Paced: 16.14 %
HIGH POWER IMPEDANCE MEASURED VALUE: 50 Ohm
HighPow Impedance: 41 Ohm
Implantable Lead Implant Date: 20130419
Implantable Lead Location: 753859
Implantable Lead Model: 5076
Lead Channel Impedance Value: 475 Ohm
Lead Channel Impedance Value: 532 Ohm
Lead Channel Sensing Intrinsic Amplitude: 11.75 mV
Lead Channel Setting Pacing Amplitude: 2 V
Lead Channel Setting Pacing Amplitude: 2.5 V
Lead Channel Setting Pacing Pulse Width: 0.4 ms
Lead Channel Setting Sensing Sensitivity: 0.3 mV
MDC IDC LEAD IMPLANT DT: 20130419
MDC IDC LEAD LOCATION: 753860
MDC IDC MSMT LEADCHNL RA SENSING INTR AMPL: 0.875 mV
MDC IDC MSMT LEADCHNL RV PACING THRESHOLD AMPLITUDE: 0.625 V
MDC IDC MSMT LEADCHNL RV PACING THRESHOLD PULSEWIDTH: 0.4 ms
MDC IDC SESS DTM: 20161024182408
MDC IDC STAT BRADY AS VS PERCENT: 83.83 %

## 2014-11-26 ENCOUNTER — Encounter: Payer: Self-pay | Admitting: Cardiology

## 2014-12-04 ENCOUNTER — Other Ambulatory Visit: Payer: Self-pay | Admitting: Cardiology

## 2014-12-04 DIAGNOSIS — H353131 Nonexudative age-related macular degeneration, bilateral, early dry stage: Secondary | ICD-10-CM | POA: Diagnosis not present

## 2015-01-05 ENCOUNTER — Other Ambulatory Visit: Payer: Self-pay | Admitting: Cardiology

## 2015-01-05 NOTE — Telephone Encounter (Signed)
°*  STAT* If patient is at the pharmacy, call can be transferred to refill team.   1. Which medications need to be refilled? (please list name of each medication and dose if known) Furosemide  2. Which pharmacy/location (including street and city if local pharmacy) is medication to [facility] sent to?[phone]  3. Do they need a 30 day or 90 day supply? 90 and refills

## 2015-01-06 MED ORDER — FUROSEMIDE 40 MG PO TABS: 40.0000 mg | ORAL_TABLET | Freq: Every day | ORAL | Status: DC

## 2015-01-06 NOTE — Telephone Encounter (Signed)
Rx(s) sent to pharmacy electronically.  

## 2015-01-12 ENCOUNTER — Other Ambulatory Visit: Payer: Self-pay | Admitting: Cardiology

## 2015-01-12 NOTE — Telephone Encounter (Signed)
°*  STAT* If patient is at the pharmacy, call can be transferred to refill team.   1. Which medications need to be refilled? (please list name of each medication and dose if known) Furosemide  2. Which pharmacy/location (including street and city if local pharmacy) is [facility] to [facility] to[phone] 3. Do they need a 30 day or 90 day supply? 90 and refills

## 2015-01-12 NOTE — Telephone Encounter (Signed)
Pt called again,wants to make sure he gets 90,not 30 please.Please call today if possible.

## 2015-01-13 MED ORDER — FUROSEMIDE 40 MG PO TABS: 40.0000 mg | ORAL_TABLET | Freq: Every day | ORAL | Status: DC

## 2015-01-13 NOTE — Telephone Encounter (Signed)
Rx(s) sent to pharmacy electronically.  

## 2015-01-15 DIAGNOSIS — E1151 Type 2 diabetes mellitus with diabetic peripheral angiopathy without gangrene: Secondary | ICD-10-CM | POA: Diagnosis not present

## 2015-01-15 DIAGNOSIS — Z6827 Body mass index (BMI) 27.0-27.9, adult: Secondary | ICD-10-CM | POA: Diagnosis not present

## 2015-01-15 DIAGNOSIS — I255 Ischemic cardiomyopathy: Secondary | ICD-10-CM | POA: Diagnosis not present

## 2015-01-15 DIAGNOSIS — N183 Chronic kidney disease, stage 3 (moderate): Secondary | ICD-10-CM | POA: Diagnosis not present

## 2015-01-15 DIAGNOSIS — E784 Other hyperlipidemia: Secondary | ICD-10-CM | POA: Diagnosis not present

## 2015-02-23 ENCOUNTER — Ambulatory Visit (INDEPENDENT_AMBULATORY_CARE_PROVIDER_SITE_OTHER): Payer: Medicare Other | Admitting: *Deleted

## 2015-02-23 DIAGNOSIS — I472 Ventricular tachycardia: Secondary | ICD-10-CM | POA: Diagnosis not present

## 2015-02-23 DIAGNOSIS — I4729 Other ventricular tachycardia: Secondary | ICD-10-CM

## 2015-02-23 DIAGNOSIS — I5022 Chronic systolic (congestive) heart failure: Secondary | ICD-10-CM | POA: Diagnosis not present

## 2015-02-25 ENCOUNTER — Encounter: Payer: Self-pay | Admitting: Cardiology

## 2015-02-27 LAB — CUP PACEART REMOTE DEVICE CHECK
Battery Voltage: 2.87 V
Brady Statistic AP VP Percent: 0.03 %
Brady Statistic AP VS Percent: 0.02 %
Brady Statistic AS VP Percent: 11.39 %
Brady Statistic RV Percent Paced: 11.42 %
Date Time Interrogation Session: 20170125192209
HIGH POWER IMPEDANCE MEASURED VALUE: 53 Ohm
HighPow Impedance: 45 Ohm
Implantable Lead Implant Date: 20130419
Implantable Lead Implant Date: 20130419
Implantable Lead Location: 753860
Implantable Lead Model: 5076
Lead Channel Pacing Threshold Amplitude: 0.75 V
Lead Channel Pacing Threshold Pulse Width: 0.4 ms
Lead Channel Sensing Intrinsic Amplitude: 0.625 mV
Lead Channel Sensing Intrinsic Amplitude: 11.625 mV
Lead Channel Sensing Intrinsic Amplitude: 11.625 mV
Lead Channel Setting Pacing Amplitude: 2 V
Lead Channel Setting Pacing Pulse Width: 0.4 ms
MDC IDC LEAD LOCATION: 753859
MDC IDC MSMT LEADCHNL RA IMPEDANCE VALUE: 475 Ohm
MDC IDC MSMT LEADCHNL RA SENSING INTR AMPL: 0.625 mV
MDC IDC MSMT LEADCHNL RV IMPEDANCE VALUE: 494 Ohm
MDC IDC SET LEADCHNL RV PACING AMPLITUDE: 2.5 V
MDC IDC SET LEADCHNL RV SENSING SENSITIVITY: 0.3 mV
MDC IDC STAT BRADY AS VS PERCENT: 88.56 %
MDC IDC STAT BRADY RA PERCENT PACED: 0.06 %

## 2015-02-27 NOTE — Progress Notes (Signed)
Remote ICD transmission.   

## 2015-03-04 ENCOUNTER — Encounter: Payer: Self-pay | Admitting: Cardiology

## 2015-03-24 ENCOUNTER — Encounter (HOSPITAL_COMMUNITY): Payer: Self-pay

## 2015-03-24 ENCOUNTER — Emergency Department (HOSPITAL_COMMUNITY): Payer: Medicare Other

## 2015-03-24 ENCOUNTER — Emergency Department (HOSPITAL_COMMUNITY)
Admission: EM | Admit: 2015-03-24 | Discharge: 2015-03-24 | Disposition: A | Discharge status: Home / Self Care | Payer: Medicare Other | Attending: Emergency Medicine | Admitting: Emergency Medicine

## 2015-03-24 DIAGNOSIS — E119 Type 2 diabetes mellitus without complications: Secondary | ICD-10-CM | POA: Insufficient documentation

## 2015-03-24 DIAGNOSIS — R202 Paresthesia of skin: Secondary | ICD-10-CM | POA: Diagnosis not present

## 2015-03-24 DIAGNOSIS — I509 Heart failure, unspecified: Secondary | ICD-10-CM | POA: Diagnosis not present

## 2015-03-24 DIAGNOSIS — Z792 Long term (current) use of antibiotics: Secondary | ICD-10-CM | POA: Insufficient documentation

## 2015-03-24 DIAGNOSIS — I4891 Unspecified atrial fibrillation: Secondary | ICD-10-CM | POA: Insufficient documentation

## 2015-03-24 DIAGNOSIS — Z79899 Other long term (current) drug therapy: Secondary | ICD-10-CM | POA: Diagnosis not present

## 2015-03-24 DIAGNOSIS — G8929 Other chronic pain: Secondary | ICD-10-CM | POA: Insufficient documentation

## 2015-03-24 DIAGNOSIS — R200 Anesthesia of skin: Secondary | ICD-10-CM

## 2015-03-24 DIAGNOSIS — Z862 Personal history of diseases of the blood and blood-forming organs and certain disorders involving the immune mechanism: Secondary | ICD-10-CM | POA: Insufficient documentation

## 2015-03-24 DIAGNOSIS — Z87891 Personal history of nicotine dependence: Secondary | ICD-10-CM | POA: Insufficient documentation

## 2015-03-24 DIAGNOSIS — I252 Old myocardial infarction: Secondary | ICD-10-CM | POA: Diagnosis not present

## 2015-03-24 DIAGNOSIS — Z8701 Personal history of pneumonia (recurrent): Secondary | ICD-10-CM | POA: Diagnosis not present

## 2015-03-24 DIAGNOSIS — E785 Hyperlipidemia, unspecified: Secondary | ICD-10-CM | POA: Diagnosis not present

## 2015-03-24 DIAGNOSIS — M1990 Unspecified osteoarthritis, unspecified site: Secondary | ICD-10-CM | POA: Diagnosis not present

## 2015-03-24 DIAGNOSIS — Z95810 Presence of automatic (implantable) cardiac defibrillator: Secondary | ICD-10-CM | POA: Diagnosis not present

## 2015-03-24 DIAGNOSIS — Z7951 Long term (current) use of inhaled steroids: Secondary | ICD-10-CM | POA: Diagnosis not present

## 2015-03-24 DIAGNOSIS — I10 Essential (primary) hypertension: Secondary | ICD-10-CM | POA: Diagnosis not present

## 2015-03-24 DIAGNOSIS — J45909 Unspecified asthma, uncomplicated: Secondary | ICD-10-CM | POA: Insufficient documentation

## 2015-03-24 DIAGNOSIS — I25119 Atherosclerotic heart disease of native coronary artery with unspecified angina pectoris: Secondary | ICD-10-CM | POA: Insufficient documentation

## 2015-03-24 DIAGNOSIS — R61 Generalized hyperhidrosis: Secondary | ICD-10-CM | POA: Diagnosis not present

## 2015-03-24 DIAGNOSIS — I2510 Atherosclerotic heart disease of native coronary artery without angina pectoris: Secondary | ICD-10-CM

## 2015-03-24 DIAGNOSIS — R1084 Generalized abdominal pain: Secondary | ICD-10-CM | POA: Diagnosis not present

## 2015-03-24 DIAGNOSIS — R109 Unspecified abdominal pain: Secondary | ICD-10-CM | POA: Diagnosis not present

## 2015-03-24 DIAGNOSIS — R208 Other disturbances of skin sensation: Secondary | ICD-10-CM

## 2015-03-24 DIAGNOSIS — K219 Gastro-esophageal reflux disease without esophagitis: Secondary | ICD-10-CM | POA: Diagnosis not present

## 2015-03-24 DIAGNOSIS — R011 Cardiac murmur, unspecified: Secondary | ICD-10-CM | POA: Diagnosis not present

## 2015-03-24 DIAGNOSIS — R51 Headache: Secondary | ICD-10-CM | POA: Diagnosis not present

## 2015-03-24 DIAGNOSIS — Z9861 Coronary angioplasty status: Secondary | ICD-10-CM | POA: Insufficient documentation

## 2015-03-24 LAB — URINALYSIS, ROUTINE W REFLEX MICROSCOPIC
Bilirubin Urine: NEGATIVE
Glucose, UA: NEGATIVE mg/dL
Hgb urine dipstick: NEGATIVE
Ketones, ur: NEGATIVE mg/dL
NITRITE: NEGATIVE
Protein, ur: NEGATIVE mg/dL
Specific Gravity, Urine: 1.016 (ref 1.005–1.030)
pH: 7 (ref 5.0–8.0)

## 2015-03-24 LAB — COMPREHENSIVE METABOLIC PANEL
ALK PHOS: 93 U/L (ref 38–126)
ALT: 9 U/L — ABNORMAL LOW (ref 17–63)
AST: 16 U/L (ref 15–41)
Albumin: 3.7 g/dL (ref 3.5–5.0)
Anion gap: 10 (ref 5–15)
BILIRUBIN TOTAL: 0.6 mg/dL (ref 0.3–1.2)
BUN: 21 mg/dL — ABNORMAL HIGH (ref 6–20)
CALCIUM: 9.9 mg/dL (ref 8.9–10.3)
CO2: 28 mmol/L (ref 22–32)
Chloride: 102 mmol/L (ref 101–111)
Creatinine, Ser: 1.31 mg/dL — ABNORMAL HIGH (ref 0.61–1.24)
GFR calc non Af Amer: 50 mL/min — ABNORMAL LOW (ref >60)
GFR, EST AFRICAN AMERICAN: 58 mL/min — AB (ref >60)
Glucose, Bld: 124 mg/dL — ABNORMAL HIGH (ref 65–99)
POTASSIUM: 4.8 mmol/L (ref 3.5–5.1)
Sodium: 140 mmol/L (ref 135–145)
Total Protein: 6.9 g/dL (ref 6.5–8.1)

## 2015-03-24 LAB — I-STAT TROPONIN, ED
Troponin i, poc: 0.01 ng/mL (ref 0.00–0.08)
Troponin i, poc: 0 ng/mL (ref 0.00–0.08)

## 2015-03-24 LAB — URINE MICROSCOPIC-ADD ON

## 2015-03-24 LAB — CBC
HEMATOCRIT: 41.7 % (ref 39.0–52.0)
HEMOGLOBIN: 13.7 g/dL (ref 13.0–17.0)
MCH: 30 pg (ref 26.0–34.0)
MCHC: 32.9 g/dL (ref 30.0–36.0)
MCV: 91.2 fL (ref 78.0–100.0)
Platelets: 143 10*3/uL — ABNORMAL LOW (ref 150–400)
RBC: 4.57 MIL/uL (ref 4.22–5.81)
RDW: 14.6 % (ref 11.5–15.5)
WBC: 7.8 10*3/uL (ref 4.0–10.5)

## 2015-03-24 LAB — LIPASE, BLOOD: Lipase: 22 U/L (ref 11–51)
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
## 2015-03-24 NOTE — ED Notes (Signed)
Pt returned from CT °

## 2015-03-24 NOTE — ED Notes (Signed)
Pt reports chronic abdominal pain and sts "It's on and off all the time its that GERD I have."  Pt took 2 tums PTA and reports relief of abdominal pain that is now 2/10.  Pt reports he had a HA this morning with tingling down his left arm that lasted aprox. 30 min and has since resolved.  Pt denies any weakness during episode and sts "It was just kind of tingling for a little bit."

## 2015-03-24 NOTE — ED Notes (Signed)
Phlebotomy at the bedside  

## 2015-04-03 DIAGNOSIS — D6489 Other specified anemias: Secondary | ICD-10-CM | POA: Diagnosis not present

## 2015-04-03 DIAGNOSIS — I2510 Atherosclerotic heart disease of native coronary artery without angina pectoris: Secondary | ICD-10-CM | POA: Diagnosis not present

## 2015-04-03 DIAGNOSIS — I480 Paroxysmal atrial fibrillation: Secondary | ICD-10-CM | POA: Diagnosis not present

## 2015-04-03 DIAGNOSIS — R61 Generalized hyperhidrosis: Secondary | ICD-10-CM | POA: Diagnosis not present

## 2015-04-03 DIAGNOSIS — K5900 Constipation, unspecified: Secondary | ICD-10-CM | POA: Diagnosis not present

## 2015-04-03 DIAGNOSIS — Z6827 Body mass index (BMI) 27.0-27.9, adult: Secondary | ICD-10-CM | POA: Diagnosis not present

## 2015-04-03 DIAGNOSIS — E1151 Type 2 diabetes mellitus with diabetic peripheral angiopathy without gangrene: Secondary | ICD-10-CM | POA: Diagnosis not present

## 2015-04-03 DIAGNOSIS — K219 Gastro-esophageal reflux disease without esophagitis: Secondary | ICD-10-CM | POA: Diagnosis not present

## 2015-04-16 ENCOUNTER — Other Ambulatory Visit: Payer: PRIVATE HEALTH INSURANCE

## 2015-04-16 ENCOUNTER — Telehealth: Payer: Self-pay | Admitting: Hematology and Oncology

## 2015-04-16 ENCOUNTER — Ambulatory Visit: Payer: PRIVATE HEALTH INSURANCE | Admitting: Hematology and Oncology

## 2015-04-16 ENCOUNTER — Encounter: Payer: Self-pay | Admitting: Hematology and Oncology

## 2015-04-16 NOTE — Telephone Encounter (Signed)
lvm for pt regarding to new d.t

## 2015-05-05 ENCOUNTER — Other Ambulatory Visit: Payer: PRIVATE HEALTH INSURANCE

## 2015-05-05 ENCOUNTER — Ambulatory Visit: Payer: PRIVATE HEALTH INSURANCE | Admitting: Hematology and Oncology

## 2015-05-18 ENCOUNTER — Encounter: Payer: Self-pay | Admitting: Cardiology

## 2015-05-18 ENCOUNTER — Ambulatory Visit (INDEPENDENT_AMBULATORY_CARE_PROVIDER_SITE_OTHER): Payer: Medicare Other | Admitting: Cardiology

## 2015-05-18 VITALS — BP 120/61 | HR 106 | Ht 69.0 in | Wt 195.8 lb

## 2015-05-18 DIAGNOSIS — Z95810 Presence of automatic (implantable) cardiac defibrillator: Secondary | ICD-10-CM

## 2015-05-18 DIAGNOSIS — I472 Ventricular tachycardia: Secondary | ICD-10-CM

## 2015-05-18 DIAGNOSIS — I5022 Chronic systolic (congestive) heart failure: Secondary | ICD-10-CM | POA: Diagnosis not present

## 2015-05-18 DIAGNOSIS — I2510 Atherosclerotic heart disease of native coronary artery without angina pectoris: Secondary | ICD-10-CM | POA: Diagnosis not present

## 2015-05-18 DIAGNOSIS — I4891 Unspecified atrial fibrillation: Secondary | ICD-10-CM

## 2015-05-18 DIAGNOSIS — I4729 Other ventricular tachycardia: Secondary | ICD-10-CM

## 2015-05-18 NOTE — Patient Instructions (Signed)
Continue your current therapy  Try reducing your water intake a little. If your weight starts going up you will need to increase lasix back to once a day  I will see you in 6 months
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
## 2015-05-22 ENCOUNTER — Other Ambulatory Visit: Payer: Self-pay

## 2015-05-22 DIAGNOSIS — E1151 Type 2 diabetes mellitus with diabetic peripheral angiopathy without gangrene: Secondary | ICD-10-CM | POA: Diagnosis not present

## 2015-05-22 DIAGNOSIS — Z6827 Body mass index (BMI) 27.0-27.9, adult: Secondary | ICD-10-CM | POA: Diagnosis not present

## 2015-05-22 DIAGNOSIS — M1990 Unspecified osteoarthritis, unspecified site: Secondary | ICD-10-CM | POA: Diagnosis not present

## 2015-05-22 DIAGNOSIS — K219 Gastro-esophageal reflux disease without esophagitis: Secondary | ICD-10-CM | POA: Diagnosis not present

## 2015-05-22 DIAGNOSIS — N183 Chronic kidney disease, stage 3 (moderate): Secondary | ICD-10-CM | POA: Diagnosis not present

## 2015-05-22 DIAGNOSIS — I509 Heart failure, unspecified: Secondary | ICD-10-CM | POA: Diagnosis not present

## 2015-05-25 ENCOUNTER — Telehealth: Payer: Self-pay | Admitting: Cardiology

## 2015-05-25 ENCOUNTER — Other Ambulatory Visit: Payer: Self-pay | Admitting: *Deleted

## 2015-05-25 ENCOUNTER — Ambulatory Visit (INDEPENDENT_AMBULATORY_CARE_PROVIDER_SITE_OTHER): Payer: Medicare Other | Admitting: *Deleted

## 2015-05-25 DIAGNOSIS — Z95810 Presence of automatic (implantable) cardiac defibrillator: Secondary | ICD-10-CM

## 2015-05-25 DIAGNOSIS — M25562 Pain in left knee: Secondary | ICD-10-CM | POA: Diagnosis not present

## 2015-05-25 DIAGNOSIS — I5022 Chronic systolic (congestive) heart failure: Secondary | ICD-10-CM | POA: Diagnosis not present

## 2015-05-25 DIAGNOSIS — M25561 Pain in right knee: Secondary | ICD-10-CM | POA: Diagnosis not present

## 2015-05-25 DIAGNOSIS — I472 Ventricular tachycardia: Secondary | ICD-10-CM

## 2015-05-25 DIAGNOSIS — I4729 Other ventricular tachycardia: Secondary | ICD-10-CM

## 2015-05-25 MED ORDER — ROSUVASTATIN CALCIUM 5 MG PO TABS: ORAL_TABLET | ORAL | Status: DC

## 2015-05-25 NOTE — Telephone Encounter (Signed)
LMOVM reminding pt to send remote transmission.   

## 2015-05-25 NOTE — Progress Notes (Signed)
Remote ICD transmission.   

## 2015-05-30 ENCOUNTER — Other Ambulatory Visit: Payer: Self-pay | Admitting: Internal Medicine

## 2015-05-31 ENCOUNTER — Other Ambulatory Visit: Payer: Self-pay | Admitting: Cardiology

## 2015-06-02 NOTE — Telephone Encounter (Signed)
REFILL 

## 2015-06-04 ENCOUNTER — Telehealth: Payer: Self-pay | Admitting: *Deleted

## 2015-06-04 NOTE — Telephone Encounter (Signed)
TC from patient stating he needs an appt with Dr. Alvy Bimler. He missed his yearly appt in April and needs to reschedule.

## 2015-06-05 NOTE — Telephone Encounter (Signed)
I will get scheduler to reschedule Pls send this kind of future messages to scheduler directly Thanks

## 2015-06-08 ENCOUNTER — Telehealth: Payer: Self-pay | Admitting: Hematology and Oncology

## 2015-06-08 NOTE — Telephone Encounter (Signed)
s.w. pt and advised on 5.19 appt...pt ok and aware °

## 2015-06-12 ENCOUNTER — Telehealth: Payer: Self-pay | Admitting: Cardiology

## 2015-06-12 NOTE — Telephone Encounter (Signed)
LMOVM for pt to return my call.  

## 2015-06-12 NOTE — Telephone Encounter (Signed)
New Message ° °4. Are you calling to see if we received your device transmission? Yes  ° °

## 2015-06-15 NOTE — Telephone Encounter (Signed)
LMOVM informing pt that we received his remote transmission on 05-26-15.

## 2015-06-16 ENCOUNTER — Telehealth: Payer: Self-pay | Admitting: Internal Medicine

## 2015-06-16 NOTE — Telephone Encounter (Signed)
Spoke w/ pt and informed him that we did receive his remote transmission. Pt verbalized understanding.

## 2015-06-16 NOTE — Telephone Encounter (Signed)
New Message  4. Are you calling to see if we received your device transmission?  Yes. Received a letter that it wasn't received. Please  calll

## 2015-06-19 ENCOUNTER — Encounter: Payer: Self-pay | Admitting: Hematology and Oncology

## 2015-06-19 ENCOUNTER — Telehealth: Payer: Self-pay | Admitting: Hematology and Oncology

## 2015-06-19 ENCOUNTER — Ambulatory Visit (HOSPITAL_BASED_OUTPATIENT_CLINIC_OR_DEPARTMENT_OTHER): Payer: Medicare Other | Admitting: Hematology and Oncology

## 2015-06-19 ENCOUNTER — Other Ambulatory Visit (HOSPITAL_BASED_OUTPATIENT_CLINIC_OR_DEPARTMENT_OTHER): Payer: Medicare Other

## 2015-06-19 ENCOUNTER — Other Ambulatory Visit: Payer: Self-pay | Admitting: Hematology and Oncology

## 2015-06-19 VITALS — BP 132/73 | HR 83 | Temp 97.8°F | Resp 17 | Ht 69.0 in | Wt 197.5 lb

## 2015-06-19 DIAGNOSIS — L989 Disorder of the skin and subcutaneous tissue, unspecified: Secondary | ICD-10-CM

## 2015-06-19 DIAGNOSIS — D72821 Monocytosis (symptomatic): Secondary | ICD-10-CM

## 2015-06-19 DIAGNOSIS — D696 Thrombocytopenia, unspecified: Secondary | ICD-10-CM

## 2015-06-19 LAB — CBC WITH DIFFERENTIAL/PLATELET
BASO%: 0.1 % (ref 0.0–2.0)
Basophils Absolute: 0 10*3/uL (ref 0.0–0.1)
EOS ABS: 0.1 10*3/uL (ref 0.0–0.5)
EOS%: 0.7 % (ref 0.0–7.0)
HCT: 44.2 % (ref 38.4–49.9)
HGB: 14.6 g/dL (ref 13.0–17.1)
LYMPH%: 21.9 % (ref 14.0–49.0)
MCH: 30.5 pg (ref 27.2–33.4)
MCHC: 33 g/dL (ref 32.0–36.0)
MCV: 92.5 fL (ref 79.3–98.0)
MONO#: 1.9 10*3/uL — AB (ref 0.1–0.9)
MONO%: 21 % — AB (ref 0.0–14.0)
NEUT#: 5 10*3/uL (ref 1.5–6.5)
NEUT%: 56.3 % (ref 39.0–75.0)
PLATELETS: 132 10*3/uL — AB (ref 140–400)
RBC: 4.78 10*6/uL (ref 4.20–5.82)
RDW: 14.6 % (ref 11.0–14.6)
WBC: 8.8 10*3/uL (ref 4.0–10.3)
lymph#: 1.9 10*3/uL (ref 0.9–3.3)
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
## 2015-06-19 NOTE — Telephone Encounter (Signed)
Gave and printed appt shced and avs for pt for June Dr. Ubaldo Glassing on 6.2 @ 10:10am...and for May 2018

## 2015-06-19 NOTE — Assessment & Plan Note (Signed)
The cause is likely related to CMML. I am not able to palpate splenomegaly but the examination could be limited due to obesity. It is mild and there is little change compared from previous platelet count. The patient denies recent history of bleeding such as epistaxis, hematuria or hematochezia. He is asymptomatic from the thrombocytopenia. I will observe for now.

## 2015-06-19 NOTE — Assessment & Plan Note (Signed)
He has a skin lesion on his forehead that has been present for many months. That could be a form of skin cancer. When I saw him last year, I had recommended referral to dermatologist but he has not seen one. I am concerned about the close proximity to the left eye that if it is a form of invasive skin cancer, he could have compromised vision on the left eye. Ultimately, the patient agree for referral to see dermatologist.

## 2015-06-19 NOTE — Assessment & Plan Note (Signed)
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 He has chronic monocytosis for many years but he is not symptomatic. I will continue to see him on a yearly basis for monitoring. 

## 2015-06-26 ENCOUNTER — Telehealth: Payer: Self-pay | Admitting: *Deleted

## 2015-06-26 MED ORDER — CRESTOR 5 MG PO TABS: ORAL_TABLET | ORAL | Status: DC

## 2015-06-26 NOTE — Telephone Encounter (Signed)
Pharmacist called - states patient is complaining of muscle pain  Patient has been using generic dose of crestor  patient wanted to see if brand did the same thing. Needed a new prescription - with different direction for insurance to cover.  verbal given -DAW-CRESTOR  (1-2 TABLETS WEEKLY)

## 2015-06-26 NOTE — Telephone Encounter (Signed)
Returned call to patient no answer.LMTC. 

## 2015-06-26 NOTE — Telephone Encounter (Signed)
Patient requests that someone call him at [phone]. It is in regards to the rx for crestor. Thanks, MI

## 2015-06-30 ENCOUNTER — Telehealth: Payer: Self-pay | Admitting: Internal Medicine

## 2015-06-30 NOTE — Telephone Encounter (Signed)
F/u    Pt returning call to [place]

## 2015-06-30 NOTE — Telephone Encounter (Signed)
Returned call to patient no answer.LMTC. 

## 2015-07-01 NOTE — Telephone Encounter (Signed)
Received call from patient.He stated he cannot take Crestor 5 mg once a week.Stated causes cramping in lower legs,feet and hands.Dr.Jordan out of office today.I will speak to him 07/02/15 and call you back.

## 2015-07-01 NOTE — Telephone Encounter (Signed)
See previous 07/01/15 note.

## 2015-07-02 LAB — CUP PACEART REMOTE DEVICE CHECK
Brady Statistic AP VP Percent: 0.03 %
Brady Statistic AP VS Percent: 0.02 %
Brady Statistic AS VP Percent: 9.67 %
Brady Statistic RA Percent Paced: 0.05 %
Brady Statistic RV Percent Paced: 9.7 %
Date Time Interrogation Session: 20170424163428
HIGH POWER IMPEDANCE MEASURED VALUE: 44 Ohm
HighPow Impedance: 54 Ohm
Implantable Lead Implant Date: 20130419
Implantable Lead Location: 753860
Implantable Lead Model: 5076
Implantable Lead Model: 6947
Lead Channel Impedance Value: 494 Ohm
Lead Channel Pacing Threshold Amplitude: 0.625 V
Lead Channel Sensing Intrinsic Amplitude: 0.875 mV
Lead Channel Sensing Intrinsic Amplitude: 10.875 mV
Lead Channel Setting Pacing Amplitude: 2 V
Lead Channel Setting Pacing Pulse Width: 0.4 ms
MDC IDC LEAD IMPLANT DT: 20130419
MDC IDC LEAD LOCATION: 753859
MDC IDC MSMT BATTERY VOLTAGE: 2.77 V
MDC IDC MSMT LEADCHNL RA IMPEDANCE VALUE: 475 Ohm
MDC IDC MSMT LEADCHNL RA PACING THRESHOLD AMPLITUDE: 0.75 V
MDC IDC MSMT LEADCHNL RA PACING THRESHOLD PULSEWIDTH: 0.4 ms
MDC IDC MSMT LEADCHNL RA SENSING INTR AMPL: 0.875 mV
MDC IDC MSMT LEADCHNL RV PACING THRESHOLD PULSEWIDTH: 0.4 ms
MDC IDC MSMT LEADCHNL RV SENSING INTR AMPL: 10.875 mV
MDC IDC SET LEADCHNL RV PACING AMPLITUDE: 2.5 V
MDC IDC SET LEADCHNL RV SENSING SENSITIVITY: 0.3 mV
MDC IDC STAT BRADY AS VS PERCENT: 90.28 %

## 2015-07-03 ENCOUNTER — Encounter: Payer: Self-pay | Admitting: Cardiology

## 2015-07-03 DIAGNOSIS — C44319 Basal cell carcinoma of skin of other parts of face: Secondary | ICD-10-CM | POA: Diagnosis not present

## 2015-07-03 DIAGNOSIS — D485 Neoplasm of uncertain behavior of skin: Secondary | ICD-10-CM | POA: Diagnosis not present

## 2015-07-03 DIAGNOSIS — L821 Other seborrheic keratosis: Secondary | ICD-10-CM | POA: Diagnosis not present

## 2015-07-06 NOTE — Telephone Encounter (Signed)
Returned call to patient 07/03/15.Spoke to [name] he advised stop Crestor.Advised to follow diet.

## 2015-07-13 ENCOUNTER — Telehealth: Payer: Self-pay | Admitting: Cardiology

## 2015-07-13 NOTE — Telephone Encounter (Signed)
F/u Message  Pt stated he was returning RN call. Please call back to discuss

## 2015-07-14 NOTE — Telephone Encounter (Signed)
Cannot locate recent results or call notes.

## 2015-07-14 NOTE — Telephone Encounter (Signed)
Returned call to patient.Advised I did not call him.Stated he was doing good.Advised to call back the end of this month to schedule Oct follow up with Dr.Jordan.

## 2015-07-14 NOTE — Telephone Encounter (Signed)
F/u  Pt stated- returning [place] phone call- did not see note. Please call back and discuss.

## 2015-07-17 DIAGNOSIS — I480 Paroxysmal atrial fibrillation: Secondary | ICD-10-CM | POA: Diagnosis not present

## 2015-07-17 DIAGNOSIS — R252 Cramp and spasm: Secondary | ICD-10-CM | POA: Diagnosis not present

## 2015-07-17 DIAGNOSIS — I959 Hypotension, unspecified: Secondary | ICD-10-CM | POA: Diagnosis not present

## 2015-07-17 DIAGNOSIS — Z6828 Body mass index (BMI) 28.0-28.9, adult: Secondary | ICD-10-CM | POA: Diagnosis not present

## 2015-07-17 DIAGNOSIS — M791 Myalgia: Secondary | ICD-10-CM | POA: Diagnosis not present

## 2015-07-25 DIAGNOSIS — G8929 Other chronic pain: Secondary | ICD-10-CM | POA: Diagnosis not present

## 2015-07-25 DIAGNOSIS — M25561 Pain in right knee: Secondary | ICD-10-CM | POA: Diagnosis not present

## 2015-07-25 DIAGNOSIS — M170 Bilateral primary osteoarthritis of knee: Secondary | ICD-10-CM | POA: Diagnosis not present

## 2015-08-11 DIAGNOSIS — C44319 Basal cell carcinoma of skin of other parts of face: Secondary | ICD-10-CM | POA: Diagnosis not present

## 2015-08-11 DIAGNOSIS — Z85828 Personal history of other malignant neoplasm of skin: Secondary | ICD-10-CM | POA: Diagnosis not present

## 2015-08-13 ENCOUNTER — Telehealth: Payer: Self-pay

## 2015-08-13 NOTE — Telephone Encounter (Signed)
Received surgical clearance from [facility] for cardiac clearance for right knee surgery.Dr.Jordan advised patient not a surgical candidate.

## 2015-08-27 ENCOUNTER — Other Ambulatory Visit: Payer: Self-pay

## 2015-08-27 ENCOUNTER — Ambulatory Visit (INDEPENDENT_AMBULATORY_CARE_PROVIDER_SITE_OTHER): Payer: Medicare Other | Admitting: Internal Medicine

## 2015-08-27 ENCOUNTER — Encounter: Payer: Self-pay | Admitting: Internal Medicine

## 2015-08-27 VITALS — BP 114/52 | HR 82 | Ht 72.0 in | Wt 201.8 lb

## 2015-08-27 DIAGNOSIS — I2510 Atherosclerotic heart disease of native coronary artery without angina pectoris: Secondary | ICD-10-CM

## 2015-08-27 DIAGNOSIS — Z95810 Presence of automatic (implantable) cardiac defibrillator: Secondary | ICD-10-CM | POA: Diagnosis not present

## 2015-08-27 DIAGNOSIS — I4891 Unspecified atrial fibrillation: Secondary | ICD-10-CM

## 2015-08-27 DIAGNOSIS — I5022 Chronic systolic (congestive) heart failure: Secondary | ICD-10-CM

## 2015-08-27 NOTE — Patient Instructions (Signed)
Medication Instructions:  Your physician recommends that you continue on your current medications as directed. Please refer to the Current Medication list given to you today.   Today only take an extra fluid pill--Furosemide  Labwork None ordered    Testing/Procedures: None ordered   Follow-Up: Your physician wants you to follow-up in: 12 months with Dr Knox Saliva will receive a reminder letter in the mail two months in advance. If you don't receive a letter, please call our office to schedule the follow-up appointment.   Remote monitoring is used to monitor your ICD from home. This monitoring reduces the number of office visits required to check your device to one time per year. It allows [place] to keep an eye on the functioning of your device to ensure it is working properly. You are scheduled for a device check from home on 11/26/15. You may send your transmission at any time that day. If you have a wireless device, the transmission will be sent automatically. After your physician reviews your transmission, you will receive a postcard with your next transmission date.     Any Other Special Instructions Will Be Listed Below (If Applicable).     If you need a refill on your cardiac medications before your next appointment, please call your pharmacy.

## 2015-08-27 NOTE — Progress Notes (Signed)
HPI [name] returns today for followup.he is a very pleasant 82 year old man with a history of chronic atrial fibrillation, symptomatic tachycardia bradycardia syndrome, VT, status post ICD implantation.  He has been on amiodarone but has chronic atrial fib and his amio was discontinued months ago. He has had no more jerking movement of his legs. He has missed some lasix doses and admits to some dietary indiscretion. No ICD therapies.  Allergies  Allergen Reactions  . Celebrex [Celecoxib] Hives and Other (See Comments)    Gi upset  . Esomeprazole Magnesium Hives    Other reaction(s): Hives "don't really remember"  . Tamsulosin Other (See Comments)    Dizziness, Made BP very low and weakness  . Dronedarone     Other reaction(s): GI upset, abdominal pain  . Digoxin Other (See Comments)    Unknown   . Hydrocodone-Acetaminophen Other (See Comments)    Bad headache  . Protonix [Pantoprazole Sodium] Nausea And Vomiting    Tolerates Dexilant        Past Medical History:  Diagnosis Date  . Abnormal thyroid scan    Abnormal thyroid imaging studies from 11/09/2010, status post ultrasound guided fine needle aspiration of the dominant left inferior thyroid nodule on 12/15/2010. Cytology report showed rare follicular epithelial cells and hemosiderin laden macrophages.  . Anginal pain ([place])   . Arthritis    "all over"  . Asthma   . Atrial fibrillation ([place])    on chronic Coumadin; stopped July 2013 due to subdural hematomas  . CHF (congestive heart failure) (H[facility])    EF 35-40% s/p most recent ICD generator change-out with Medtronic dual-chamber ICD 05/20/11 with explantation of previous abdominally-implanted device  . Chronic back pain    "top of neck to lower back"  . Coronary artery disease    s/p CABG 1983 and PCI/stent 2004.   . Diabetes mellitus    diet controlled  . Dyslipidemia   . Erythrocytosis   . Family history of anesthesia complication   . GERD (gastroesophageal reflux  disease)   . Headache(784.0)   . Heart murmur   . Hypertension   . ICD (implantable cardiac defibrillator) in place   . Ischemic cardiomyopathy    WITH CHF  . Monocytosis 04/17/2013  . Myocardial infarction ([place]) 1983; ~ 1990  . 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 Pneumonia August 2013  . Shortness of breath    "once in awhile when I'm relaxing"  . Subdural hematoma [facility]) July 2013   Anticoagulation stopped.   . VT (ventricular tachycardia) ([place])   . Wears glasses     ROS:   All systems reviewed and negative except as noted in the HPI.   Past Surgical History:  Procedure Laterality Date  . CHOLECYSTECTOMY    . COLONOSCOPY  07/07/2011   Procedure: COLONOSCOPY;  Surgeon: Jerene Bears, MD;  Location: [facility] ENDOSCOPY;  Service: Gastroenterology;  Laterality: N/A;  . CORONARY ANGIOPLASTY WITH STENT PLACEMENT  2004   Tandem Cypher stents LAD  . CORONARY ARTERY BYPASS GRAFT  1983   SVG-mLAD  . ESOPHAGOGASTRODUODENOSCOPY  02/11/2011   Procedure: ESOPHAGOGASTRODUODENOSCOPY (EGD);  Surgeon: Beryle Beams, MD;  Location: [facility];  Service: Endoscopy;  Laterality: N/A;  . IMPLANTABLE CARDIOVERTER DEFIBRILLATOR (ICD) GENERATOR CHANGE N/A 05/20/2011   Procedure: ICD GENERATOR CHANGE;  Surgeon: Evans Lance, MD;  Medtronic secure dual-chamber ICD serial number [id number]   . KNEE ARTHROSCOPY     right; "just went in and scraped it"  . MASS EXCISION  Right 05/10/2013   Procedure: EXCISION MASS RIGHT THUMB;  Surgeon: Wynonia Sours, MD;  Location: [facility];  Service: Orthopedics;  Laterality: Right;  . PROXIMAL INTERPHALANGEAL FUSION (PIP) Right 05/10/2013   Procedure: DEBRIDEMENT PROXIMAL INTERPHALANGEAL FUSION (PIP);  Surgeon: Wynonia Sours, MD;  Location: [facility];  Service: Orthopedics;  Laterality: Right;  . TONSILLECTOMY           Family History  Problem Relation Age of Onset  . Heart disease Brother   . Diabetes Sister   . Diabetes Brother   . Tuberculosis  Mother   . Tuberculosis Father   . Clotting disorder Brother      Social History   Social History  . Marital status: Divorced    Spouse name: N/A  . Number of children: 2  . Years of education: N/A   Occupational History  . real estate Retired   Social History Main Topics  . Smoking status: Former Smoker    Packs/day: 2.00    Years: 30.00    Types: Cigarettes    Quit date: 06/23/1976  . Smokeless tobacco: Never Used  . Alcohol use Yes     Comment: 08/17/11 "used to drink a little bit; last drink was 4-5 years ago"  . Drug use: No  . Sexual activity: No   Other Topics Concern  . Not on file   Social History Narrative  . No narrative on file     BP (!) 114/52   Pulse 82   Ht 6' (1.829 m)   Wt 201 lb 12.8 oz (91.5 kg)   BMI 27.37 kg/m   Physical Exam:  Well appearing 82 year old man, NAD HEENT: Unremarkable Neck: 7 cm JVD, no thyromegally Lungs:  Clear with no wheezes, rales, or rhonchi. HEART:  IRegular rate rhythm, no murmurs, no rubs, no clicks Abd:  soft, positive bowel sounds, no organomegally, no rebound, no guarding Ext:  2 plus pulses, no edema, no cyanosis, no clubbing Skin:  No rashes no nodules Neuro:  CN II through XII intact, motor grossly intact   DEVICE  Normal device function.  See PaceArt for details.   Assess/Plan: 1. VT - he has had no recurrent VT 2. Atrial fib - he is chronically in atrial fib. His rates are controlled. 3. Chronic systolic heart failure - his optivol is up and he admits to missing some of his lasix. I have asked him to take 2 tabs today and then one 40 mg tab daily. 4. ICD - his Medtronic ICD is working normally. He is approaching ERI. Will follow.  [name].D.

## 2015-08-28 ENCOUNTER — Other Ambulatory Visit: Payer: Self-pay | Admitting: Cardiology

## 2015-08-28 NOTE — Telephone Encounter (Signed)
Benito C Osmundson Peter M [name], MD at 05/18/2015 1:54 PM  CRESTOR 5 MG tablet TAKE 1 TABLET (5 MG TOTAL) BY MOUTH ONCE A WEEK ON SUNDAY   Patient Instructions   Continue your current therapy

## 2015-08-29 ENCOUNTER — Other Ambulatory Visit: Payer: Self-pay | Admitting: Internal Medicine

## 2015-09-01 ENCOUNTER — Encounter (HOSPITAL_COMMUNITY): Payer: Self-pay | Admitting: Emergency Medicine

## 2015-09-01 ENCOUNTER — Emergency Department (HOSPITAL_COMMUNITY)
Admission: EM | Admit: 2015-09-01 | Discharge: 2015-09-01 | Disposition: A | Discharge status: Home / Self Care | Payer: Medicare Other | Attending: Emergency Medicine | Admitting: Emergency Medicine

## 2015-09-01 ENCOUNTER — Emergency Department (HOSPITAL_COMMUNITY): Payer: Medicare Other

## 2015-09-01 DIAGNOSIS — Z87891 Personal history of nicotine dependence: Secondary | ICD-10-CM | POA: Insufficient documentation

## 2015-09-01 DIAGNOSIS — I2510 Atherosclerotic heart disease of native coronary artery without angina pectoris: Secondary | ICD-10-CM | POA: Insufficient documentation

## 2015-09-01 DIAGNOSIS — I252 Old myocardial infarction: Secondary | ICD-10-CM | POA: Insufficient documentation

## 2015-09-01 DIAGNOSIS — Z955 Presence of coronary angioplasty implant and graft: Secondary | ICD-10-CM | POA: Insufficient documentation

## 2015-09-01 DIAGNOSIS — R109 Unspecified abdominal pain: Secondary | ICD-10-CM | POA: Diagnosis not present

## 2015-09-01 DIAGNOSIS — K5900 Constipation, unspecified: Secondary | ICD-10-CM | POA: Diagnosis not present

## 2015-09-01 DIAGNOSIS — J45909 Unspecified asthma, uncomplicated: Secondary | ICD-10-CM | POA: Diagnosis not present

## 2015-09-01 DIAGNOSIS — I110 Hypertensive heart disease with heart failure: Secondary | ICD-10-CM | POA: Insufficient documentation

## 2015-09-01 DIAGNOSIS — E119 Type 2 diabetes mellitus without complications: Secondary | ICD-10-CM | POA: Diagnosis not present

## 2015-09-01 DIAGNOSIS — R194 Change in bowel habit: Secondary | ICD-10-CM | POA: Diagnosis not present

## 2015-09-01 DIAGNOSIS — Z79899 Other long term (current) drug therapy: Secondary | ICD-10-CM | POA: Insufficient documentation

## 2015-09-01 DIAGNOSIS — Z95810 Presence of automatic (implantable) cardiac defibrillator: Secondary | ICD-10-CM | POA: Diagnosis not present

## 2015-09-01 DIAGNOSIS — I5022 Chronic systolic (congestive) heart failure: Secondary | ICD-10-CM | POA: Insufficient documentation

## 2015-09-01 DIAGNOSIS — R1010 Upper abdominal pain, unspecified: Secondary | ICD-10-CM | POA: Diagnosis present

## 2015-09-01 LAB — COMPREHENSIVE METABOLIC PANEL
ALBUMIN: 4.6 g/dL (ref 3.5–5.0)
ALT: 13 U/L — ABNORMAL LOW (ref 17–63)
ANION GAP: 9 (ref 5–15)
AST: 18 U/L (ref 15–41)
Alkaline Phosphatase: 85 U/L (ref 38–126)
BUN: 31 mg/dL — AB (ref 6–20)
CHLORIDE: 100 mmol/L — AB (ref 101–111)
CO2: 30 mmol/L (ref 22–32)
Calcium: 9.9 mg/dL (ref 8.9–10.3)
Creatinine, Ser: 1.31 mg/dL — ABNORMAL HIGH (ref 0.61–1.24)
GFR calc Af Amer: 58 mL/min — ABNORMAL LOW (ref >60)
GFR calc non Af Amer: 50 mL/min — ABNORMAL LOW (ref >60)
GLUCOSE: 130 mg/dL — AB (ref 65–99)
POTASSIUM: 4.2 mmol/L (ref 3.5–5.1)
Sodium: 139 mmol/L (ref 135–145)
Total Bilirubin: 1.5 mg/dL — ABNORMAL HIGH (ref 0.3–1.2)
Total Protein: 7.8 g/dL (ref 6.5–8.1)

## 2015-09-01 LAB — CBC
HCT: 44.1 % (ref 39.0–52.0)
Hemoglobin: 14.8 g/dL (ref 13.0–17.0)
MCH: 31.1 pg (ref 26.0–34.0)
MCHC: 33.6 g/dL (ref 30.0–36.0)
MCV: 92.6 fL (ref 78.0–100.0)
PLATELETS: 163 10*3/uL (ref 150–400)
RBC: 4.76 MIL/uL (ref 4.22–5.81)
RDW: 15.1 % (ref 11.5–15.5)
WBC: 10.4 10*3/uL (ref 4.0–10.5)

## 2015-09-01 LAB — URINALYSIS, ROUTINE W REFLEX MICROSCOPIC
Bilirubin Urine: NEGATIVE
GLUCOSE, UA: NEGATIVE mg/dL
Hgb urine dipstick: NEGATIVE
Ketones, ur: NEGATIVE mg/dL
LEUKOCYTES UA: NEGATIVE
NITRITE: NEGATIVE
PH: 6.5 (ref 5.0–8.0)
PROTEIN: NEGATIVE mg/dL
Specific Gravity, Urine: 1.025 (ref 1.005–1.030)

## 2015-09-01 LAB — LIPASE, BLOOD: LIPASE: 23 U/L (ref 11–51)

## 2015-09-01 NOTE — ED Triage Notes (Signed)
Pt states he has acid reflux and that his bowels have not been acting right lately  Pt states sometimes he is a little constipated on other times they just go and go  Pt states he had about 5 bowels movements yesterday  Pt is c/o some general abd pain mainly upper quadrants  Denies nausea or vomiting  Pt states he has been eating well and drinking fluids well  Pt adds that he has been having cramping in his legs, mainly the right leg and in his right hand off and on but his dr gave him some medication for that a couple weeks ago but it is not seeming to help   Pt is pleasant in triage  No acute distress noted

## 2015-09-01 NOTE — ED Notes (Signed)
Pt complaining of acid reflux with epigastric pain. Bowel sounds audible in all four quadrants.
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
## 2015-09-28 DIAGNOSIS — E1151 Type 2 diabetes mellitus with diabetic peripheral angiopathy without gangrene: Secondary | ICD-10-CM | POA: Diagnosis not present

## 2015-09-28 DIAGNOSIS — K219 Gastro-esophageal reflux disease without esophagitis: Secondary | ICD-10-CM | POA: Diagnosis not present

## 2015-09-28 DIAGNOSIS — K5900 Constipation, unspecified: Secondary | ICD-10-CM | POA: Diagnosis not present

## 2015-09-28 DIAGNOSIS — Z6827 Body mass index (BMI) 27.0-27.9, adult: Secondary | ICD-10-CM | POA: Diagnosis not present

## 2015-09-28 DIAGNOSIS — R140 Abdominal distension (gaseous): Secondary | ICD-10-CM | POA: Diagnosis not present

## 2015-10-14 ENCOUNTER — Telehealth: Payer: Self-pay | Admitting: Cardiology

## 2015-10-14 NOTE — Telephone Encounter (Signed)
Pt called and wanted to know if he could wear medical alert around his neck. I informed him he could as long as it was not directly on top of his device. Pt verbalized understanding.

## 2015-10-28 ENCOUNTER — Other Ambulatory Visit: Payer: Self-pay | Admitting: Cardiology

## 2015-10-28 NOTE — Telephone Encounter (Signed)
Rx(s) sent to pharmacy electronically.  

## 2015-11-08 ENCOUNTER — Encounter (HOSPITAL_COMMUNITY): Payer: Self-pay

## 2015-11-08 ENCOUNTER — Emergency Department (HOSPITAL_COMMUNITY): Payer: Medicare Other

## 2015-11-08 ENCOUNTER — Emergency Department (HOSPITAL_COMMUNITY)
Admission: EM | Admit: 2015-11-08 | Discharge: 2015-11-08 | Disposition: A | Discharge status: Home / Self Care | Payer: Medicare Other | Attending: Emergency Medicine | Admitting: Emergency Medicine

## 2015-11-08 DIAGNOSIS — Z951 Presence of aortocoronary bypass graft: Secondary | ICD-10-CM | POA: Diagnosis not present

## 2015-11-08 DIAGNOSIS — I5023 Acute on chronic systolic (congestive) heart failure: Secondary | ICD-10-CM | POA: Diagnosis not present

## 2015-11-08 DIAGNOSIS — Z955 Presence of coronary angioplasty implant and graft: Secondary | ICD-10-CM | POA: Insufficient documentation

## 2015-11-08 DIAGNOSIS — Z79899 Other long term (current) drug therapy: Secondary | ICD-10-CM | POA: Diagnosis not present

## 2015-11-08 DIAGNOSIS — K5900 Constipation, unspecified: Secondary | ICD-10-CM | POA: Diagnosis not present

## 2015-11-08 DIAGNOSIS — I252 Old myocardial infarction: Secondary | ICD-10-CM | POA: Diagnosis not present

## 2015-11-08 DIAGNOSIS — E119 Type 2 diabetes mellitus without complications: Secondary | ICD-10-CM | POA: Diagnosis not present

## 2015-11-08 DIAGNOSIS — Z87891 Personal history of nicotine dependence: Secondary | ICD-10-CM | POA: Diagnosis not present

## 2015-11-08 DIAGNOSIS — J45909 Unspecified asthma, uncomplicated: Secondary | ICD-10-CM | POA: Diagnosis not present

## 2015-11-08 DIAGNOSIS — I110 Hypertensive heart disease with heart failure: Secondary | ICD-10-CM | POA: Insufficient documentation

## 2015-11-08 DIAGNOSIS — I10 Essential (primary) hypertension: Secondary | ICD-10-CM | POA: Diagnosis not present

## 2015-11-08 DIAGNOSIS — I2510 Atherosclerotic heart disease of native coronary artery without angina pectoris: Secondary | ICD-10-CM | POA: Insufficient documentation

## 2015-11-08 LAB — COMPREHENSIVE METABOLIC PANEL
ALBUMIN: 4.2 g/dL (ref 3.5–5.0)
ALK PHOS: 78 U/L (ref 38–126)
ALT: 12 U/L — ABNORMAL LOW (ref 17–63)
ANION GAP: 10 (ref 5–15)
AST: 23 U/L (ref 15–41)
BUN: 28 mg/dL — ABNORMAL HIGH (ref 6–20)
CALCIUM: 9.3 mg/dL (ref 8.9–10.3)
CHLORIDE: 101 mmol/L (ref 101–111)
CO2: 28 mmol/L (ref 22–32)
Creatinine, Ser: 1.39 mg/dL — ABNORMAL HIGH (ref 0.61–1.24)
GFR calc Af Amer: 54 mL/min — ABNORMAL LOW (ref >60)
GFR calc non Af Amer: 46 mL/min — ABNORMAL LOW (ref >60)
GLUCOSE: 131 mg/dL — AB (ref 65–99)
POTASSIUM: 3.8 mmol/L (ref 3.5–5.1)
SODIUM: 139 mmol/L (ref 135–145)
Total Bilirubin: 1.3 mg/dL — ABNORMAL HIGH (ref 0.3–1.2)
Total Protein: 7.5 g/dL (ref 6.5–8.1)

## 2015-11-08 LAB — I-STAT CG4 LACTIC ACID, ED
LACTIC ACID, VENOUS: 1.04 mmol/L (ref 0.5–1.9)
Lactic Acid, Venous: 2.1 mmol/L (ref 0.5–1.9)

## 2015-11-08 LAB — CBC WITH DIFFERENTIAL/PLATELET
BASOS PCT: 0 %
Basophils Absolute: 0 10*3/uL (ref 0.0–0.1)
EOS ABS: 0.1 10*3/uL (ref 0.0–0.7)
Eosinophils Relative: 1 %
HCT: 42.6 % (ref 39.0–52.0)
HEMOGLOBIN: 14.4 g/dL (ref 13.0–17.0)
Lymphocytes Relative: 23 %
Lymphs Abs: 2 10*3/uL (ref 0.7–4.0)
MCH: 31.5 pg (ref 26.0–34.0)
MCHC: 33.8 g/dL (ref 30.0–36.0)
MCV: 93.2 fL (ref 78.0–100.0)
MONOS PCT: 21 %
Monocytes Absolute: 1.8 10*3/uL — ABNORMAL HIGH (ref 0.1–1.0)
NEUTROS PCT: 55 %
Neutro Abs: 4.7 10*3/uL (ref 1.7–7.7)
PLATELETS: 165 10*3/uL (ref 150–400)
RBC: 4.57 MIL/uL (ref 4.22–5.81)
RDW: 13.8 % (ref 11.5–15.5)
WBC: 8.6 10*3/uL (ref 4.0–10.5)

## 2015-11-08 LAB — LIPASE, BLOOD: LIPASE: 22 U/L (ref 11–51)

## 2015-11-08 LAB — URINALYSIS, ROUTINE W REFLEX MICROSCOPIC
Bilirubin Urine: NEGATIVE
GLUCOSE, UA: NEGATIVE mg/dL
HGB URINE DIPSTICK: NEGATIVE
Ketones, ur: NEGATIVE mg/dL
LEUKOCYTES UA: NEGATIVE
NITRITE: NEGATIVE
PH: 7 (ref 5.0–8.0)
PROTEIN: NEGATIVE mg/dL
Specific Gravity, Urine: >1.046 — ABNORMAL HIGH (ref 1.005–1.030)

## 2015-11-08 LAB — PROTIME-INR
INR: 1.07
Prothrombin Time: 13.9 seconds (ref 11.4–15.2)

## 2015-11-08 MED ORDER — IOPAMIDOL (ISOVUE-300) INJECTION 61%
100.0000 mL | Freq: Once | INTRAVENOUS | Status: AC | PRN
  Administered 2015-11-08: 80 mL via INTRAVENOUS

## 2015-11-08 MED ORDER — SODIUM CHLORIDE 0.9 % IV BOLUS (SEPSIS)
1000.0000 mL | Freq: Once | INTRAVENOUS | Status: AC
  Administered 2015-11-08: 1000 mL via INTRAVENOUS

## 2015-11-08 NOTE — ED Notes (Signed)
Pt tried 2X to get UR but not successful

## 2015-11-08 NOTE — ED Notes (Signed)
Bed: WA02 Expected date: 11/08/15 Expected time: 10:46 AM Means of arrival: Ambulance Comments: constipation

## 2015-11-08 NOTE — ED Notes (Signed)
Patient stated he took Tramadol x 2 out of his own personal medication that he brought in.

## 2015-11-08 NOTE — Discharge Instructions (Signed)
Please follow-up with your PCP for further constipation management. No evidence of obstruction today.
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
## 2015-11-08 NOTE — ED Triage Notes (Signed)
Per EMS- Patient c/o constipation x 3 days despite taking suppositories.

## 2015-11-08 NOTE — ED Notes (Signed)
Lactic Acid= 2.10, MD [place] notified

## 2015-11-09 LAB — URINE CULTURE

## 2015-11-10 DIAGNOSIS — Z23 Encounter for immunization: Secondary | ICD-10-CM | POA: Diagnosis not present

## 2015-11-26 ENCOUNTER — Ambulatory Visit (INDEPENDENT_AMBULATORY_CARE_PROVIDER_SITE_OTHER): Payer: Medicare Other | Admitting: *Deleted

## 2015-11-26 DIAGNOSIS — I472 Ventricular tachycardia: Secondary | ICD-10-CM

## 2015-11-26 DIAGNOSIS — I4729 Other ventricular tachycardia: Secondary | ICD-10-CM

## 2015-11-26 NOTE — Progress Notes (Signed)
Remote ICD transmission.   

## 2015-11-30 ENCOUNTER — Other Ambulatory Visit: Payer: Self-pay | Admitting: Cardiology

## 2015-12-01 DIAGNOSIS — Z125 Encounter for screening for malignant neoplasm of prostate: Secondary | ICD-10-CM | POA: Diagnosis not present

## 2015-12-01 DIAGNOSIS — E1151 Type 2 diabetes mellitus with diabetic peripheral angiopathy without gangrene: Secondary | ICD-10-CM | POA: Diagnosis not present

## 2015-12-01 DIAGNOSIS — E041 Nontoxic single thyroid nodule: Secondary | ICD-10-CM | POA: Diagnosis not present

## 2015-12-01 DIAGNOSIS — N390 Urinary tract infection, site not specified: Secondary | ICD-10-CM | POA: Diagnosis not present

## 2015-12-01 DIAGNOSIS — R8290 Unspecified abnormal findings in urine: Secondary | ICD-10-CM | POA: Diagnosis not present

## 2015-12-01 DIAGNOSIS — E784 Other hyperlipidemia: Secondary | ICD-10-CM | POA: Diagnosis not present

## 2015-12-04 ENCOUNTER — Encounter: Payer: Self-pay | Admitting: Cardiology

## 2015-12-08 DIAGNOSIS — R260 Ataxic gait: Secondary | ICD-10-CM | POA: Diagnosis not present

## 2015-12-08 DIAGNOSIS — Z6828 Body mass index (BMI) 28.0-28.9, adult: Secondary | ICD-10-CM | POA: Diagnosis not present

## 2015-12-08 DIAGNOSIS — I509 Heart failure, unspecified: Secondary | ICD-10-CM | POA: Diagnosis not present

## 2015-12-08 DIAGNOSIS — I480 Paroxysmal atrial fibrillation: Secondary | ICD-10-CM | POA: Diagnosis not present

## 2015-12-08 DIAGNOSIS — I6200 Nontraumatic subdural hemorrhage, unspecified: Secondary | ICD-10-CM | POA: Diagnosis not present

## 2015-12-08 DIAGNOSIS — Z0000 Encounter for general adult medical examination without abnormal findings: Secondary | ICD-10-CM | POA: Diagnosis not present

## 2015-12-08 DIAGNOSIS — E1151 Type 2 diabetes mellitus with diabetic peripheral angiopathy without gangrene: Secondary | ICD-10-CM | POA: Diagnosis not present

## 2015-12-08 DIAGNOSIS — I255 Ischemic cardiomyopathy: Secondary | ICD-10-CM | POA: Diagnosis not present

## 2015-12-08 DIAGNOSIS — Z1389 Encounter for screening for other disorder: Secondary | ICD-10-CM | POA: Diagnosis not present

## 2015-12-08 DIAGNOSIS — Z23 Encounter for immunization: Secondary | ICD-10-CM | POA: Diagnosis not present

## 2015-12-08 DIAGNOSIS — J449 Chronic obstructive pulmonary disease, unspecified: Secondary | ICD-10-CM | POA: Diagnosis not present

## 2015-12-08 DIAGNOSIS — E784 Other hyperlipidemia: Secondary | ICD-10-CM | POA: Diagnosis not present

## 2015-12-10 ENCOUNTER — Encounter: Payer: Self-pay | Admitting: Cardiology

## 2015-12-10 ENCOUNTER — Ambulatory Visit (INDEPENDENT_AMBULATORY_CARE_PROVIDER_SITE_OTHER): Payer: Medicare Other | Admitting: Cardiology

## 2015-12-10 VITALS — BP 119/57 | HR 94 | Ht 72.0 in | Wt 199.4 lb

## 2015-12-10 DIAGNOSIS — I4891 Unspecified atrial fibrillation: Secondary | ICD-10-CM

## 2015-12-10 DIAGNOSIS — I472 Ventricular tachycardia: Secondary | ICD-10-CM

## 2015-12-10 DIAGNOSIS — Z95810 Presence of automatic (implantable) cardiac defibrillator: Secondary | ICD-10-CM

## 2015-12-10 DIAGNOSIS — I5022 Chronic systolic (congestive) heart failure: Secondary | ICD-10-CM

## 2015-12-10 DIAGNOSIS — I2510 Atherosclerotic heart disease of native coronary artery without angina pectoris: Secondary | ICD-10-CM

## 2015-12-10 DIAGNOSIS — I4729 Other ventricular tachycardia: Secondary | ICD-10-CM
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
## 2015-12-10 NOTE — Patient Instructions (Signed)
Continue your current therapy. Take extra lasix as needed for swelling  I will see you in 6 months.

## 2015-12-24 LAB — CUP PACEART REMOTE DEVICE CHECK
Brady Statistic AP VS Percent: 0.02 %
Brady Statistic AS VP Percent: 6.72 %
Brady Statistic AS VS Percent: 93.23 %
HIGH POWER IMPEDANCE MEASURED VALUE: 44 Ohm
HighPow Impedance: 50 Ohm
Implantable Lead Location: 753859
Implantable Lead Model: 6947
Lead Channel Pacing Threshold Amplitude: 0.625 V
Lead Channel Pacing Threshold Pulse Width: 0.4 ms
Lead Channel Sensing Intrinsic Amplitude: 0.875 mV
Lead Channel Sensing Intrinsic Amplitude: 0.875 mV
Lead Channel Setting Pacing Amplitude: 2.5 V
Lead Channel Setting Pacing Pulse Width: 0.4 ms
MDC IDC LEAD IMPLANT DT: 20130419
MDC IDC LEAD IMPLANT DT: 20130419
MDC IDC LEAD LOCATION: 753860
MDC IDC MSMT BATTERY VOLTAGE: 2.64 V
MDC IDC MSMT LEADCHNL RA IMPEDANCE VALUE: 475 Ohm
MDC IDC MSMT LEADCHNL RA PACING THRESHOLD AMPLITUDE: 0.75 V
MDC IDC MSMT LEADCHNL RA PACING THRESHOLD PULSEWIDTH: 0.4 ms
MDC IDC MSMT LEADCHNL RV IMPEDANCE VALUE: 494 Ohm
MDC IDC MSMT LEADCHNL RV SENSING INTR AMPL: 10.5 mV
MDC IDC MSMT LEADCHNL RV SENSING INTR AMPL: 10.5 mV
MDC IDC PG IMPLANT DT: 20130419
MDC IDC SESS DTM: 20171026131058
MDC IDC SET LEADCHNL RA PACING AMPLITUDE: 2 V
MDC IDC SET LEADCHNL RV SENSING SENSITIVITY: 0.3 mV
MDC IDC STAT BRADY AP VP PERCENT: 0.03 %
MDC IDC STAT BRADY RA PERCENT PACED: 0.05 %
MDC IDC STAT BRADY RV PERCENT PACED: 6.75 %

## 2016-01-18 ENCOUNTER — Other Ambulatory Visit: Payer: Self-pay | Admitting: Cardiology

## 2016-01-22 ENCOUNTER — Telehealth: Payer: Self-pay

## 2016-01-22 NOTE — Telephone Encounter (Signed)
Patient referred to [facility] clinic by [facility], device tech/Dr Lovena Le.  Attempted ICM intro call to patient and no answer or answering machine.  Patient scheduled for remote transmission on 02/25/2016 and will provide ICM intro at that time.

## 2016-02-15 DIAGNOSIS — M25561 Pain in right knee: Secondary | ICD-10-CM | POA: Diagnosis not present

## 2016-02-15 DIAGNOSIS — R262 Difficulty in walking, not elsewhere classified: Secondary | ICD-10-CM | POA: Diagnosis not present

## 2016-02-15 DIAGNOSIS — M170 Bilateral primary osteoarthritis of knee: Secondary | ICD-10-CM | POA: Diagnosis not present

## 2016-02-15 DIAGNOSIS — M1711 Unilateral primary osteoarthritis, right knee: Secondary | ICD-10-CM | POA: Diagnosis not present

## 2016-02-22 DIAGNOSIS — M25561 Pain in right knee: Secondary | ICD-10-CM | POA: Diagnosis not present

## 2016-02-22 DIAGNOSIS — M1711 Unilateral primary osteoarthritis, right knee: Secondary | ICD-10-CM | POA: Diagnosis not present

## 2016-02-25 ENCOUNTER — Ambulatory Visit (INDEPENDENT_AMBULATORY_CARE_PROVIDER_SITE_OTHER): Payer: Medicare Other | Admitting: *Deleted

## 2016-02-25 DIAGNOSIS — I472 Ventricular tachycardia: Secondary | ICD-10-CM

## 2016-02-25 DIAGNOSIS — I4729 Other ventricular tachycardia: Secondary | ICD-10-CM

## 2016-02-25 NOTE — Progress Notes (Signed)
Remote ICD transmission.   

## 2016-02-26 ENCOUNTER — Encounter: Payer: Self-pay | Admitting: Cardiology

## 2016-02-27 LAB — CUP PACEART REMOTE DEVICE CHECK
Brady Statistic AS VP Percent: 7.48 %
Brady Statistic RA Percent Paced: 0.04 %
Brady Statistic RV Percent Paced: 8.41 %
Date Time Interrogation Session: 20180125164708
HIGH POWER IMPEDANCE MEASURED VALUE: 45 Ohm
HighPow Impedance: 53 Ohm
Implantable Lead Implant Date: 20130419
Implantable Lead Location: 753860
Implantable Lead Model: 5076
Implantable Lead Model: 6947
Lead Channel Impedance Value: 494 Ohm
Lead Channel Pacing Threshold Pulse Width: 0.4 ms
Lead Channel Sensing Intrinsic Amplitude: 0.75 mV
Lead Channel Sensing Intrinsic Amplitude: 10.5 mV
Lead Channel Setting Pacing Amplitude: 2 V
Lead Channel Setting Pacing Pulse Width: 0.4 ms
Lead Channel Setting Sensing Sensitivity: 0.3 mV
MDC IDC LEAD IMPLANT DT: 20130419
MDC IDC LEAD LOCATION: 753859
MDC IDC MSMT BATTERY VOLTAGE: 2.62 V
MDC IDC MSMT LEADCHNL RA IMPEDANCE VALUE: 475 Ohm
MDC IDC MSMT LEADCHNL RA PACING THRESHOLD AMPLITUDE: 0.75 V
MDC IDC MSMT LEADCHNL RA SENSING INTR AMPL: 0.75 mV
MDC IDC MSMT LEADCHNL RV PACING THRESHOLD AMPLITUDE: 0.75 V
MDC IDC MSMT LEADCHNL RV PACING THRESHOLD PULSEWIDTH: 0.4 ms
MDC IDC MSMT LEADCHNL RV SENSING INTR AMPL: 10.5 mV
MDC IDC PG IMPLANT DT: 20130419
MDC IDC SET LEADCHNL RV PACING AMPLITUDE: 2.5 V
MDC IDC STAT BRADY AP VP PERCENT: 0.04 %
MDC IDC STAT BRADY AP VS PERCENT: 0.03 %
MDC IDC STAT BRADY AS VS PERCENT: 92.46 %

## 2016-03-07 ENCOUNTER — Telehealth: Payer: Self-pay | Admitting: Cardiology

## 2016-03-07 NOTE — Telephone Encounter (Signed)
Pt called and stated that he heard an alert tone from his device over the weekend. Pt is going to send a remote transmission and is aware that I will call him back once it is received.

## 2016-03-07 NOTE — Telephone Encounter (Signed)
Spoke w/ pt and informed him that his remote transmission was received and that a scheduler will contact him to schedule an appt w/ MD / PA / NP. Pt verbalized understanding.

## 2016-03-15 DIAGNOSIS — M25561 Pain in right knee: Secondary | ICD-10-CM | POA: Diagnosis not present

## 2016-03-15 DIAGNOSIS — M1711 Unilateral primary osteoarthritis, right knee: Secondary | ICD-10-CM | POA: Diagnosis not present
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
## 2016-03-25 ENCOUNTER — Encounter: Payer: Self-pay | Admitting: *Deleted

## 2016-03-25 ENCOUNTER — Ambulatory Visit (INDEPENDENT_AMBULATORY_CARE_PROVIDER_SITE_OTHER): Payer: Medicare Other | Admitting: Nurse Practitioner

## 2016-03-25 ENCOUNTER — Encounter: Payer: Self-pay | Admitting: Nurse Practitioner

## 2016-03-25 VITALS — BP 118/66 | HR 85 | Ht 72.0 in | Wt 193.6 lb

## 2016-03-25 DIAGNOSIS — I472 Ventricular tachycardia, unspecified: Secondary | ICD-10-CM

## 2016-03-25 DIAGNOSIS — I482 Chronic atrial fibrillation: Secondary | ICD-10-CM

## 2016-03-25 DIAGNOSIS — I5022 Chronic systolic (congestive) heart failure: Secondary | ICD-10-CM

## 2016-03-25 DIAGNOSIS — I4821 Permanent atrial fibrillation: Secondary | ICD-10-CM

## 2016-03-25 LAB — CUP PACEART INCLINIC DEVICE CHECK
Date Time Interrogation Session: 20180223120816
Implantable Lead Implant Date: 20130419
Implantable Lead Location: 753859
Implantable Pulse Generator Implant Date: 20130419
MDC IDC LEAD IMPLANT DT: 20130419
MDC IDC LEAD LOCATION: 753860

## 2016-03-25 NOTE — Patient Instructions (Addendum)
-  Medication Instructions:   Your physician recommends that you continue on your current medications as directed. Please refer to the Current Medication list given to you today.   If you need a refill on your cardiac medications before your next appointment, please call your pharmacy.  Labwork: RETURN FOR  LABS 03-30-2016    Testing/Procedures:  SEE LETTER FOR INSTRUCTIONS FOR GEN CHANGE ON 04-08-16   Follow-Up: AFTER 3 -10-2016  10 DAY WOUND CHECK WITH DEVICE CHECK   [name] WITH DR Lovena Le    Any Other Special Instructions Will Be Listed Below (If Applicable).                                                                                                                                             -

## 2016-03-30 ENCOUNTER — Other Ambulatory Visit: Payer: Medicare Other | Admitting: *Deleted

## 2016-03-30 DIAGNOSIS — I5022 Chronic systolic (congestive) heart failure: Secondary | ICD-10-CM

## 2016-03-30 LAB — BASIC METABOLIC PANEL
BUN / CREAT RATIO: 18 (ref 10–24)
BUN: 24 mg/dL (ref 8–27)
CHLORIDE: 98 mmol/L (ref 96–106)
CO2: 24 mmol/L (ref 18–29)
CREATININE: 1.3 mg/dL — AB (ref 0.76–1.27)
Calcium: 9.2 mg/dL (ref 8.6–10.2)
GFR calc Af Amer: 59 mL/min/{1.73_m2} — ABNORMAL LOW (ref >59)
GFR calc non Af Amer: 51 mL/min/{1.73_m2} — ABNORMAL LOW (ref >59)
GLUCOSE: 110 mg/dL — AB (ref 65–99)
Potassium: 4.2 mmol/L (ref 3.5–5.2)
Sodium: 141 mmol/L (ref 134–144)

## 2016-03-30 LAB — CBC
HEMATOCRIT: 44.3 % (ref 37.5–51.0)
Hemoglobin: 14.9 g/dL (ref 13.0–17.7)
MCH: 30.2 pg (ref 26.6–33.0)
MCHC: 33.6 g/dL (ref 31.5–35.7)
MCV: 90 fL (ref 79–97)
Platelets: 173 10*3/uL (ref 150–379)
RBC: 4.93 x10E6/uL (ref 4.14–5.80)
RDW: 14.8 % (ref 12.3–15.4)
WBC: 9.7 10*3/uL (ref 3.4–10.8)

## 2016-04-04 DIAGNOSIS — M1990 Unspecified osteoarthritis, unspecified site: Secondary | ICD-10-CM | POA: Diagnosis not present

## 2016-04-04 DIAGNOSIS — Z1389 Encounter for screening for other disorder: Secondary | ICD-10-CM | POA: Diagnosis not present

## 2016-04-04 DIAGNOSIS — Z6827 Body mass index (BMI) 27.0-27.9, adult: Secondary | ICD-10-CM | POA: Diagnosis not present

## 2016-04-04 DIAGNOSIS — E1151 Type 2 diabetes mellitus with diabetic peripheral angiopathy without gangrene: Secondary | ICD-10-CM | POA: Diagnosis not present

## 2016-04-04 DIAGNOSIS — I255 Ischemic cardiomyopathy: Secondary | ICD-10-CM | POA: Diagnosis not present

## 2016-04-04 DIAGNOSIS — I6200 Nontraumatic subdural hemorrhage, unspecified: Secondary | ICD-10-CM | POA: Diagnosis not present

## 2016-04-04 DIAGNOSIS — I480 Paroxysmal atrial fibrillation: Secondary | ICD-10-CM | POA: Diagnosis not present

## 2016-04-04 DIAGNOSIS — R413 Other amnesia: Secondary | ICD-10-CM | POA: Diagnosis not present

## 2016-04-04 DIAGNOSIS — I509 Heart failure, unspecified: Secondary | ICD-10-CM | POA: Diagnosis not present

## 2016-04-04 DIAGNOSIS — N183 Chronic kidney disease, stage 3 (moderate): Secondary | ICD-10-CM | POA: Diagnosis not present

## 2016-04-08 ENCOUNTER — Ambulatory Visit (HOSPITAL_COMMUNITY)
Admission: RE | Admit: 2016-04-08 | Discharge: 2016-04-09 | Disposition: A | Discharge status: Home / Self Care | Payer: Medicare Other | Source: Ambulatory Visit | Attending: Internal Medicine | Admitting: Internal Medicine

## 2016-04-08 ENCOUNTER — Encounter (HOSPITAL_COMMUNITY): Payer: Self-pay | Admitting: Internal Medicine

## 2016-04-08 ENCOUNTER — Encounter (HOSPITAL_COMMUNITY)
Admission: RE | Disposition: A | Discharge status: Home / Self Care | Payer: Self-pay | Source: Ambulatory Visit | Attending: Internal Medicine

## 2016-04-08 DIAGNOSIS — I4729 Other ventricular tachycardia: Secondary | ICD-10-CM

## 2016-04-08 DIAGNOSIS — M1990 Unspecified osteoarthritis, unspecified site: Secondary | ICD-10-CM | POA: Insufficient documentation

## 2016-04-08 DIAGNOSIS — I130 Hypertensive heart and chronic kidney disease with heart failure and stage 1 through stage 4 chronic kidney disease, or unspecified chronic kidney disease: Secondary | ICD-10-CM | POA: Diagnosis not present

## 2016-04-08 DIAGNOSIS — I252 Old myocardial infarction: Secondary | ICD-10-CM | POA: Insufficient documentation

## 2016-04-08 DIAGNOSIS — Z4502 Encounter for adjustment and management of automatic implantable cardiac defibrillator: Secondary | ICD-10-CM

## 2016-04-08 DIAGNOSIS — I110 Hypertensive heart disease with heart failure: Secondary | ICD-10-CM | POA: Diagnosis not present

## 2016-04-08 DIAGNOSIS — I255 Ischemic cardiomyopathy: Secondary | ICD-10-CM | POA: Insufficient documentation

## 2016-04-08 DIAGNOSIS — J45909 Unspecified asthma, uncomplicated: Secondary | ICD-10-CM | POA: Diagnosis not present

## 2016-04-08 DIAGNOSIS — Z79899 Other long term (current) drug therapy: Secondary | ICD-10-CM | POA: Diagnosis not present

## 2016-04-08 DIAGNOSIS — E785 Hyperlipidemia, unspecified: Secondary | ICD-10-CM | POA: Diagnosis not present

## 2016-04-08 DIAGNOSIS — Z8674 Personal history of sudden cardiac arrest: Secondary | ICD-10-CM | POA: Diagnosis not present

## 2016-04-08 DIAGNOSIS — I5022 Chronic systolic (congestive) heart failure: Secondary | ICD-10-CM | POA: Insufficient documentation

## 2016-04-08 DIAGNOSIS — K219 Gastro-esophageal reflux disease without esophagitis: Secondary | ICD-10-CM | POA: Insufficient documentation

## 2016-04-08 DIAGNOSIS — Z8249 Family history of ischemic heart disease and other diseases of the circulatory system: Secondary | ICD-10-CM | POA: Insufficient documentation

## 2016-04-08 DIAGNOSIS — Z951 Presence of aortocoronary bypass graft: Secondary | ICD-10-CM | POA: Insufficient documentation

## 2016-04-08 DIAGNOSIS — I482 Chronic atrial fibrillation: Secondary | ICD-10-CM | POA: Diagnosis not present

## 2016-04-08 DIAGNOSIS — Z7951 Long term (current) use of inhaled steroids: Secondary | ICD-10-CM | POA: Insufficient documentation

## 2016-04-08 DIAGNOSIS — E1122 Type 2 diabetes mellitus with diabetic chronic kidney disease: Secondary | ICD-10-CM | POA: Diagnosis not present

## 2016-04-08 DIAGNOSIS — Z833 Family history of diabetes mellitus: Secondary | ICD-10-CM | POA: Diagnosis not present

## 2016-04-08 DIAGNOSIS — I2510 Atherosclerotic heart disease of native coronary artery without angina pectoris: Secondary | ICD-10-CM | POA: Insufficient documentation

## 2016-04-08 DIAGNOSIS — I472 Ventricular tachycardia, unspecified: Secondary | ICD-10-CM

## 2016-04-08 DIAGNOSIS — Z7901 Long term (current) use of anticoagulants: Secondary | ICD-10-CM | POA: Insufficient documentation

## 2016-04-08 DIAGNOSIS — Z87891 Personal history of nicotine dependence: Secondary | ICD-10-CM | POA: Insufficient documentation

## 2016-04-08 DIAGNOSIS — Z95810 Presence of automatic (implantable) cardiac defibrillator: Secondary | ICD-10-CM | POA: Diagnosis present

## 2016-04-08 DIAGNOSIS — N183 Chronic kidney disease, stage 3 (moderate): Secondary | ICD-10-CM | POA: Insufficient documentation

## 2016-04-08 HISTORY — PX: ICD GENERATOR CHANGEOUT: EP1231

## 2016-04-08 LAB — GLUCOSE, CAPILLARY: GLUCOSE-CAPILLARY: 103 mg/dL — AB (ref 65–99)

## 2016-04-08 LAB — SURGICAL PCR SCREEN
MRSA, PCR: NEGATIVE
Staphylococcus aureus: POSITIVE — AB

## 2016-04-08 SURGERY — ICD GENERATOR CHANGEOUT
Anesthesia: LOCAL

## 2016-04-08 MED ORDER — CEFAZOLIN SODIUM-DEXTROSE 2-4 GM/100ML-% IV SOLN
INTRAVENOUS | Status: AC
  Filled 2016-04-08: qty 100

## 2016-04-08 MED ORDER — MIDAZOLAM HCL 5 MG/5ML IJ SOLN
INTRAMUSCULAR | Status: DC | PRN
  Administered 2016-04-08 (×6): 1 mg via INTRAVENOUS

## 2016-04-08 MED ORDER — HEPARIN (PORCINE) IN NACL 2-0.9 UNIT/ML-% IJ SOLN
INTRAMUSCULAR | Status: AC
  Filled 2016-04-08: qty 1000

## 2016-04-08 MED ORDER — CHLORHEXIDINE GLUCONATE 4 % EX LIQD
60.0000 mL | Freq: Once | CUTANEOUS | Status: DC
  Filled 2016-04-08: qty 60

## 2016-04-08 MED ORDER — ACETAMINOPHEN 325 MG PO TABS
325.0000 mg | ORAL_TABLET | ORAL | Status: DC | PRN
  Administered 2016-04-09: 650 mg via ORAL
  Filled 2016-04-08: qty 2

## 2016-04-08 MED ORDER — MUPIROCIN 2 % EX OINT
TOPICAL_OINTMENT | CUTANEOUS | Status: AC
Start: 2016-04-08 — End: 2016-04-08
  Filled 2016-04-08: qty 22

## 2016-04-08 MED ORDER — SODIUM CHLORIDE 0.9 % IV SOLN: INTRAVENOUS | Status: DC

## 2016-04-08 MED ORDER — FENTANYL CITRATE (PF) 100 MCG/2ML IJ SOLN
25.0000 ug | INTRAMUSCULAR | Status: DC | PRN
  Administered 2016-04-08: 50 ug via INTRAVENOUS
  Filled 2016-04-08: qty 2

## 2016-04-08 MED ORDER — CEFAZOLIN SODIUM-DEXTROSE 2-4 GM/100ML-% IV SOLN
2.0000 g | INTRAVENOUS | Status: DC
  Filled 2016-04-08: qty 100

## 2016-04-08 MED ORDER — FENTANYL CITRATE (PF) 100 MCG/2ML IJ SOLN
INTRAMUSCULAR | Status: DC | PRN
  Administered 2016-04-08 (×3): 12.5 ug via INTRAVENOUS
  Administered 2016-04-08: 25 ug via INTRAVENOUS
  Administered 2016-04-08: 12.5 ug via INTRAVENOUS

## 2016-04-08 MED ORDER — ONDANSETRON HCL 4 MG/2ML IJ SOLN
4.0000 mg | Freq: Four times a day (QID) | INTRAMUSCULAR | Status: DC | PRN
Start: 2016-04-08 — End: 2016-04-08

## 2016-04-08 MED ORDER — CEFAZOLIN IN D5W 1 GM/50ML IV SOLN
1.0000 g | Freq: Four times a day (QID) | INTRAVENOUS | Status: AC
  Administered 2016-04-08 – 2016-04-09 (×3): 1 g via INTRAVENOUS
  Filled 2016-04-08 (×4): qty 50

## 2016-04-08 MED ORDER — CEFAZOLIN SODIUM-DEXTROSE 2-3 GM-% IV SOLR
INTRAVENOUS | Status: DC | PRN
  Administered 2016-04-08: 2 g via INTRAVENOUS

## 2016-04-08 MED ORDER — LIDOCAINE HCL (PF) 1 % IJ SOLN
INTRAMUSCULAR | Status: DC | PRN
Start: 2016-04-08 — End: 2016-04-08
  Administered 2016-04-08: 30 mL

## 2016-04-08 MED ORDER — SODIUM CHLORIDE 0.9 % IR SOLN
80.0000 mg | Status: AC
  Administered 2016-04-08: 80 mg
  Filled 2016-04-08: qty 2

## 2016-04-08 MED ORDER — MIDAZOLAM HCL 5 MG/5ML IJ SOLN
INTRAMUSCULAR | Status: AC
  Filled 2016-04-08: qty 5

## 2016-04-08 MED ORDER — FENTANYL CITRATE (PF) 100 MCG/2ML IJ SOLN
INTRAMUSCULAR | Status: AC
  Filled 2016-04-08: qty 2

## 2016-04-08 MED ORDER — SODIUM CHLORIDE 0.9 % IR SOLN
Status: AC
  Filled 2016-04-08: qty 2

## 2016-04-08 MED ORDER — ONDANSETRON HCL 4 MG/2ML IJ SOLN: 4.0000 mg | Freq: Four times a day (QID) | INTRAMUSCULAR | Status: DC | PRN

## 2016-04-08 MED ORDER — MUPIROCIN 2 % EX OINT: TOPICAL_OINTMENT | Freq: Once | CUTANEOUS | Status: DC

## 2016-04-08 SURGICAL SUPPLY — 5 items
CABLE SURGICAL S-101-97-12 (CABLE) ×1 IMPLANT
ICD VISIA AF VR DVAB1D1 (ICD Generator) IMPLANT
PAD DEFIB LIFELINK (PAD) ×1 IMPLANT
TRAY PACEMAKER INSERTION (PACKS) ×1 IMPLANT
VISIA AF VR DVAB1D1 (ICD Generator) ×2 IMPLANT

## 2016-04-08 NOTE — Progress Notes (Signed)
Received patient from cath lab post ICD generator change,  Alert & oriented,drsg  Left shoulder area clean, dry, intact.  B/P 94/41  HR  55  A fib, Resp  18  o2 sat at 97%          Denies any pain at this time.   IV  NS at [facility], [place]  Notified of patient's admission to room.   Mervyn Skeeters, RN

## 2016-04-08 NOTE — Progress Notes (Signed)
Patient ID: [name], male   DOB: [birth year], 83 y.o.   MRN: [id number]  EP Attending  I was notified after procedure that patient has no ride home, and that he had driven himself to hospital this morning. He was sedated with IV versed and fentanyl for his procedure and while he is stable for DC, has no way home. He will be admitted for observation and be allowed to drive home tomorrow.   Mikle Bosworth.D.
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
## 2016-04-08 NOTE — Discharge Instructions (Signed)
Keep incision clean and dry, do not shower until cleared to at your wound check appointment. No driving for 2 days.  You can remove outer dressing tomorrow. Leave steri-strips (little pieces of tape) on until seen in the office for wound check appointment. Call the office [phone]) for redness, drainage, swelling, or fever. No lifting over 5 lbs for 1 week with your left arm, no vigorous left arm activity for 1 week.

## 2016-04-08 NOTE — Care Management Note (Signed)
Case Management Note  Patient Details  Name: [name] MRN: [id number] Date of Birth: January 19, 1933  Subjective/Objective:                 Patient from home, independent. In obs for EP procedure   Action/Plan:   Expected Discharge Date:                  Expected Discharge Plan:  Home/Self Care  In-House Referral:     Discharge planning Services  CM Consult  Post Acute Care Choice:    Choice offered to:     DME Arranged:    DME Agency:     HH Arranged:    HH Agency:     Status of Service:  In process, will continue to follow  If discussed at Long Length of Stay Meetings, dates discussed:    Additional Comments:  Carles Collet, RN 04/08/2016, 3:45 PM

## 2016-04-08 NOTE — H&P (Signed)
ICD Criteria  Current LVEF:40%. Within 12 months prior to implant: Yes   Heart failure history: Yes, Class II  Cardiomyopathy history: Yes, Ischemic Cardiomyopathy - Prior MI.  Atrial Fibrillation/Atrial Flutter: Yes, Long standing (> 1 Year).  Ventricular tachycardia history: Yes, Hemodynamic instability present. VT Type: Sustained Ventricular Tachycardia - Monomorphic.  Cardiac arrest history: Yes, Ventricular Tachycardia.  History of syndromes with risk of sudden death: No.  Previous ICD: Yes, Reason for ICD:  Secondary prevention.  Current ICD indication: Secondary  PPM indication: No.   Class I or II Bradycardia indication present: No  Beta Blocker therapy for 3 or more months: Yes, prescribed.   Ace Inhibitor/ARB therapy for 3 or more months: Yes, prescribed.
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
## 2016-04-09 DIAGNOSIS — J45909 Unspecified asthma, uncomplicated: Secondary | ICD-10-CM | POA: Diagnosis not present

## 2016-04-09 DIAGNOSIS — I110 Hypertensive heart disease with heart failure: Secondary | ICD-10-CM | POA: Diagnosis not present

## 2016-04-09 DIAGNOSIS — I482 Chronic atrial fibrillation: Secondary | ICD-10-CM | POA: Diagnosis not present

## 2016-04-09 DIAGNOSIS — Z95810 Presence of automatic (implantable) cardiac defibrillator: Secondary | ICD-10-CM

## 2016-04-09 DIAGNOSIS — I5022 Chronic systolic (congestive) heart failure: Secondary | ICD-10-CM | POA: Diagnosis not present

## 2016-04-09 DIAGNOSIS — M1990 Unspecified osteoarthritis, unspecified site: Secondary | ICD-10-CM | POA: Diagnosis not present

## 2016-04-09 DIAGNOSIS — Z7901 Long term (current) use of anticoagulants: Secondary | ICD-10-CM | POA: Diagnosis not present

## 2016-04-09 NOTE — Progress Notes (Signed)
Patient Name: [name]      SUBJECTIVE: s/p device change out  wo complaints x bed   Past Medical History:  Diagnosis Date  . Abnormal thyroid scan    Abnormal thyroid imaging studies from 11/09/2010, status post ultrasound guided fine needle aspiration of the dominant left inferior thyroid nodule on 12/15/2010. Cytology report showed rare follicular epithelial cells and hemosiderin laden macrophages.  . Arthritis    "all over"  . Asthma   . Atrial fibrillation ([place])    on chronic Coumadin; stopped July 2013 due to subdural hematomas  . CHF (congestive heart failure) (HCC)    EF 35-40% s/p most recent ICD generator change-out with Medtronic dual-chamber ICD 05/20/11 with explantation of previous abdominally-implanted device  . Coronary artery disease    s/p CABG 1983 and PCI/stent 2004.   . Diabetes mellitus    diet controlled  . Dyslipidemia   . Erythrocytosis   . GERD (gastroesophageal reflux disease)   . Hypertension   . Ischemic cardiomyopathy    WITH CHF  . Monocytosis 04/17/2013  . Myocardial infarction 1983; ~ 1990  . 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 Pneumonia August 2013  . Subdural hematoma [facility]) July 2013   Anticoagulation stopped.   . VT (ventricular tachycardia) ([place])   . Wears glasses     Scheduled Meds:  Scheduled Meds: Continuous Infusions: acetaminophen, fentaNYL (SUBLIMAZE) injection, ondansetron (ZOFRAN) IV    PHYSICAL EXAM Vitals:   04/08/16 1430 04/08/16 1520 04/08/16 2004 04/09/16 0500  BP: 98/60 110/62 (!) 90/50 127/81  Pulse: 63 71 69 (!) 109  Resp:   18 20  Temp:   97.8 F (36.6 C) 97.6 F (36.4 C)  TempSrc:   Oral Oral  SpO2:   100% 96%  Weight:      Height:        Well developed and nourished in no acute distress HENT normal Neck supple with JVP-flat Device pocket well healed; without hematoma or erythema.  There is no tethering no hematoma Carotids brisk and full without bruits Clear Regular rate and rhythm, no  murmurs or gallops Abd-soft with active BS without hepatomegaly No Clubbing cyanosis edema Skin-warm and dry A & Oriented  Grossly normal sensory and motor function   TELEMETRY: Reviewed personnally pt in  V pacing    Intake/Output Summary (Last 24 hours) at 04/09/16 0746 Last data filed at 04/09/16 0615  Gross per 24 hour  Intake              820 ml  Output             1250 ml  Net             -430 ml    LABS: Basic Metabolic Panel: No results for input(s): NA, K, CL, CO2, GLUCOSE, BUN, CREATININE, CALCIUM, MG, PHOS in the last 168 hours. Cardiac Enzymes: No results for input(s): CKTOTAL, CKMB, CKMBINDEX, TROPONINI in the last 72 hours. CBC: No results for input(s): WBC, NEUTROABS, HGB, HCT, MCV, PLT in the last 168 hours. PROTIME: No results for input(s): LABPROT, INR in the last 72 hours. Liver Function Tests: No results for input(s): AST, ALT, ALKPHOS, BILITOT, PROT, ALBUMIN in the last 72 hours. No results for input(s): LIPASE, AMYLASE in the last 72 hours. BNP: BNP (last 3 results) No results for input(s): BNP in the last 8760 hours.  ProBNP (last 3 results) No results for input(s): PROBNP in the last 8760 hours.  D-Dimer: No results for input(s): DDIMER in the last 72 hours. Hemoglobin A1C: No results for input(s): HGBA1C in the last 72 hours. Fasting Lipid Panel: No results for input(s): CHOL, HDL, LDLCALC, TRIG, CHOLHDL, LDLDIRECT in the last 72 hours. Thyroid Function Tests: No results for input(s): TSH, T4TOTAL, T3FREE, THYROIDAB in the last 72 hours.  Invalid input(s): FREET3 Anemia Panel: No results for input(s): VITAMINB12, FOLATE, FERRITIN, TIBC, IRON, RETICCTPCT in the last 72 hours.   Device Interrogation: normal device function    ASSESSMENT AND PLAN:  Active Problems:   VENTRICULAR TACHYCARDIA   ICD (implantable cardioverter-defibrillator) in place  Device generator replacement with sedation To drive himself home today  Instructions  given No anticoagulation 2/2 SDH  This should be reivisited   Signed, Virl Axe MD  04/09/2016

## 2016-04-09 NOTE — Progress Notes (Signed)
Progress Note  Patient Name: [name] Date of Encounter: 04/09/2016  Primary Cardiologist: Dr. [name] Electrophysiologist: Dr. Lovena Le   Subjective   Main complaint is knee and low back pain. Did not sleep well in hospital bed last night. No cardiac complaints. No chest pain or dyspnea. Ready to go home.   Inpatient Medications    Scheduled Meds:  Continuous Infusions:  PRN Meds: acetaminophen, fentaNYL (SUBLIMAZE) injection, ondansetron (ZOFRAN) IV   Vital Signs    Vitals:   04/08/16 1430 04/08/16 1520 04/08/16 2004 04/09/16 0500  BP: 98/60 110/62 (!) 90/50 127/81  Pulse: 63 71 69 (!) 109  Resp:   18 20  Temp:   97.8 F (36.6 C) 97.6 F (36.4 C)  TempSrc:   Oral Oral  SpO2:   100% 96%  Weight:      Height:        Intake/Output Summary (Last 24 hours) at 04/09/16 0748 Last data filed at 04/09/16 0615  Gross per 24 hour  Intake              820 ml  Output             1250 ml  Net             -430 ml   Filed Weights   04/08/16 0742  Weight: 193 lb (87.5 kg)    Telemetry    Atrial fibrillation, rate in the low 100s - Personally Reviewed  ECG    Permanent atrial fibrillation - Personally Reviewed  Physical Exam   GEN: No acute distress.   Neck: No JVD Cardiac: RRR, no murmurs, rubs, or gallops.  Respiratory: Clear to auscultation bilaterally. GI: Soft, nontender, non-distended  MS: No edema; No deformity. Neuro:  Nonfocal  Psych: Normal affect  Chest Wall: device pocket stable. Dressing clean. No bleeding.   Labs    ChemistryNo results for input(s): NA, K, CL, CO2, GLUCOSE, BUN, CREATININE, CALCIUM, PROT, ALBUMIN, AST, ALT, ALKPHOS, BILITOT, GFRNONAA, GFRAA, ANIONGAP in the last 168 hours.   HematologyNo results for input(s): WBC, RBC, HGB, HCT, MCV, MCH, MCHC, RDW, PLT in the last 168 hours.  Cardiac EnzymesNo results for input(s): TROPONINI in the last 168 hours. No results for input(s): TROPIPOC in the last 168 hours.   BNPNo  results for input(s): BNP, PROBNP in the last 168 hours.   DDimer No results for input(s): DDIMER in the last 168 hours.   Radiology    No results found.  Cardiac Studies/ Procedures    1. ICD generator change, 04/08/16, Dr. Lovena Le  Conclusion  Conclusion: Successful removal of a previous implanted dual-chamber ICD which had reached elective replacement, and insertion of a new single chamber ICD with capping of the old atrial lead and relocation of the pocket to a subpectoral location in a patient with an ischemic cardiomyopathy and sustained monomorphic ventricular tachycardia.    Patient Profile     83 y.o. male w/ h/o CAD, ICM, chronic systolic HF, VT s/p ICD, permament atrial fibrillation, HTN, DM and CRI admitted for ICD gen change as device had reached ERI.   Assessment & Plan    1. ICD ERI: s/p successful gen change, per Dr. Lovena Le 04/08/16. He was keep overnight given he had received sedation, and did not have a ride, or help at home. [place] for d/c home today. Wound check already arranged for 04/18/16.   2. CAD: stable w/o CP. Followed by Dr. [name].   3. ICM/Chronic Systolic HF:  continue BB + ACE-I. He has a functioning ICD. Volume stable.   4. HTN: BP is controlled on current regimen.   5. DM: per IM  6. Permanent Atrial Fibrillation: rate is in the low 100s. He is due for Coreg. Give dose prior to d/c. He is no longer on coumadin due to his history of subdural hematoma  Dispo: home today after MD clears. He has wound check f/u on 3/19.  Signed, Lyda Jester, PA-C  04/09/2016, 7:48 AM

## 2016-04-09 NOTE — Progress Notes (Signed)
Discharge instructions reviewed with patient, questions answered to his satisfaction. IV and tele box removed. Pt will be driving himself home in personal car.    Toneshia Coello M

## 2016-04-12 ENCOUNTER — Telehealth: Payer: Self-pay | Admitting: Cardiology

## 2016-04-12 ENCOUNTER — Ambulatory Visit (INDEPENDENT_AMBULATORY_CARE_PROVIDER_SITE_OTHER): Payer: Medicare Other | Admitting: *Deleted

## 2016-04-12 DIAGNOSIS — I472 Ventricular tachycardia, unspecified: Secondary | ICD-10-CM

## 2016-04-12 LAB — CUP PACEART INCLINIC DEVICE CHECK
Battery Voltage: 3.11 V
Brady Statistic RV Percent Paced: 0.34 %
Date Time Interrogation Session: 20180313165602
HighPow Impedance: 41 Ohm
HighPow Impedance: 52 Ohm
Implantable Lead Implant Date: 20130419
Implantable Lead Location: 753860
Implantable Pulse Generator Implant Date: 20180309
Lead Channel Impedance Value: 380 Ohm
Lead Channel Pacing Threshold Amplitude: 0.75 V
Lead Channel Pacing Threshold Pulse Width: 0.4 ms
Lead Channel Sensing Intrinsic Amplitude: 9.875 mV
Lead Channel Setting Pacing Pulse Width: 0.4 ms
Lead Channel Setting Sensing Sensitivity: 0.3 mV
MDC IDC LEAD IMPLANT DT: 20130419
MDC IDC LEAD LOCATION: 753859
MDC IDC MSMT BATTERY REMAINING LONGEVITY: 137 mo
MDC IDC MSMT LEADCHNL RV IMPEDANCE VALUE: 513 Ohm
MDC IDC MSMT LEADCHNL RV SENSING INTR AMPL: 10 mV
MDC IDC SET LEADCHNL RV PACING AMPLITUDE: 2.5 V

## 2016-04-12 NOTE — Telephone Encounter (Signed)
Pt called from [employer] station. Stated that he heard his ICD beeping. Pt does not have a number we could call him back at so we would not be able to contact pt after remote transmission was received. Offered pt an appt w/ device clinic for today at 3:30 pt agreed to this.

## 2016-04-12 NOTE — Progress Notes (Signed)
Pt seen d/t alert tone. Normal device function. Thresholds and sensing consistent with previous device measurements. Impedance trends stable over time. During device interrogation it was noted that the tone the pt was hearing was the battery on his life alert bracelet. ICD high alert tone demonstrated for pt and pt educated that  the alert tones from his defibrillator would go off at 8:10am, and that as long as his home monitor was plugged in the clinic would receive the alert. Pt voiced understanding.

## 2016-04-13 MED FILL — Heparin Sodium (Porcine) 2 Unit/ML in Sodium Chloride 0.9%: INTRAMUSCULAR | Qty: 1000 | Status: AC

## 2016-04-14 ENCOUNTER — Encounter (HOSPITAL_COMMUNITY): Payer: Self-pay | Admitting: Nurse Practitioner

## 2016-04-14 ENCOUNTER — Emergency Department (HOSPITAL_COMMUNITY): Payer: Medicare Other

## 2016-04-14 ENCOUNTER — Emergency Department (HOSPITAL_COMMUNITY)
Admission: EM | Admit: 2016-04-14 | Discharge: 2016-04-14 | Disposition: A | Discharge status: Home / Self Care | Payer: Medicare Other | Attending: Emergency Medicine | Admitting: Emergency Medicine

## 2016-04-14 DIAGNOSIS — Z951 Presence of aortocoronary bypass graft: Secondary | ICD-10-CM | POA: Insufficient documentation

## 2016-04-14 DIAGNOSIS — R1033 Periumbilical pain: Secondary | ICD-10-CM | POA: Diagnosis not present

## 2016-04-14 DIAGNOSIS — I110 Hypertensive heart disease with heart failure: Secondary | ICD-10-CM | POA: Diagnosis not present

## 2016-04-14 DIAGNOSIS — Z955 Presence of coronary angioplasty implant and graft: Secondary | ICD-10-CM | POA: Insufficient documentation

## 2016-04-14 DIAGNOSIS — E119 Type 2 diabetes mellitus without complications: Secondary | ICD-10-CM | POA: Insufficient documentation

## 2016-04-14 DIAGNOSIS — Z95810 Presence of automatic (implantable) cardiac defibrillator: Secondary | ICD-10-CM | POA: Insufficient documentation

## 2016-04-14 DIAGNOSIS — I5023 Acute on chronic systolic (congestive) heart failure: Secondary | ICD-10-CM | POA: Insufficient documentation

## 2016-04-14 DIAGNOSIS — Z87891 Personal history of nicotine dependence: Secondary | ICD-10-CM | POA: Insufficient documentation

## 2016-04-14 DIAGNOSIS — Z79899 Other long term (current) drug therapy: Secondary | ICD-10-CM | POA: Diagnosis not present

## 2016-04-14 DIAGNOSIS — I252 Old myocardial infarction: Secondary | ICD-10-CM | POA: Diagnosis not present

## 2016-04-14 DIAGNOSIS — K5909 Other constipation: Secondary | ICD-10-CM | POA: Insufficient documentation

## 2016-04-14 DIAGNOSIS — I2510 Atherosclerotic heart disease of native coronary artery without angina pectoris: Secondary | ICD-10-CM | POA: Insufficient documentation

## 2016-04-14 DIAGNOSIS — R1013 Epigastric pain: Secondary | ICD-10-CM | POA: Diagnosis present

## 2016-04-14 DIAGNOSIS — J45909 Unspecified asthma, uncomplicated: Secondary | ICD-10-CM | POA: Diagnosis not present

## 2016-04-14 DIAGNOSIS — R109 Unspecified abdominal pain: Secondary | ICD-10-CM | POA: Diagnosis not present

## 2016-04-14 DIAGNOSIS — I4891 Unspecified atrial fibrillation: Secondary | ICD-10-CM | POA: Diagnosis not present

## 2016-04-14 DIAGNOSIS — R1084 Generalized abdominal pain: Secondary | ICD-10-CM | POA: Diagnosis not present

## 2016-04-14 LAB — URINALYSIS, ROUTINE W REFLEX MICROSCOPIC
Bilirubin Urine: NEGATIVE
GLUCOSE, UA: NEGATIVE mg/dL
Hgb urine dipstick: NEGATIVE
KETONES UR: NEGATIVE mg/dL
LEUKOCYTES UA: NEGATIVE
NITRITE: NEGATIVE
PROTEIN: NEGATIVE mg/dL
Specific Gravity, Urine: 1.014 (ref 1.005–1.030)
pH: 5 (ref 5.0–8.0)

## 2016-04-14 LAB — CBC WITH DIFFERENTIAL/PLATELET
BASOS ABS: 0 10*3/uL (ref 0.0–0.1)
Basophils Relative: 0 %
Eosinophils Absolute: 0.1 10*3/uL (ref 0.0–0.7)
Eosinophils Relative: 1 %
HCT: 35.3 % — ABNORMAL LOW (ref 39.0–52.0)
HEMOGLOBIN: 12 g/dL — AB (ref 13.0–17.0)
LYMPHS PCT: 23 %
Lymphs Abs: 1.4 10*3/uL (ref 0.7–4.0)
MCH: 29.9 pg (ref 26.0–34.0)
MCHC: 34 g/dL (ref 30.0–36.0)
MCV: 88 fL (ref 78.0–100.0)
MONO ABS: 1.5 10*3/uL — AB (ref 0.1–1.0)
Monocytes Relative: 25 %
NEUTROS ABS: 2.9 10*3/uL (ref 1.7–7.7)
Neutrophils Relative %: 51 %
Platelets: 143 10*3/uL — ABNORMAL LOW (ref 150–400)
RBC: 4.01 MIL/uL — AB (ref 4.22–5.81)
RDW: 15 % (ref 11.5–15.5)
WBC: 5.8 10*3/uL (ref 4.0–10.5)

## 2016-04-14 LAB — COMPREHENSIVE METABOLIC PANEL
ALT: 10 U/L — ABNORMAL LOW (ref 17–63)
AST: 19 U/L (ref 15–41)
Albumin: 3.5 g/dL (ref 3.5–5.0)
Alkaline Phosphatase: 64 U/L (ref 38–126)
Anion gap: 8 (ref 5–15)
BUN: 19 mg/dL (ref 6–20)
CHLORIDE: 107 mmol/L (ref 101–111)
CO2: 26 mmol/L (ref 22–32)
Calcium: 8.9 mg/dL (ref 8.9–10.3)
Creatinine, Ser: 1.14 mg/dL (ref 0.61–1.24)
GFR calc Af Amer: >60 mL/min (ref >60)
GFR calc non Af Amer: 58 mL/min — ABNORMAL LOW (ref >60)
Glucose, Bld: 120 mg/dL — ABNORMAL HIGH (ref 65–99)
POTASSIUM: 3.6 mmol/L (ref 3.5–5.1)
Sodium: 141 mmol/L (ref 135–145)
Total Bilirubin: 1.1 mg/dL (ref 0.3–1.2)
Total Protein: 6.6 g/dL (ref 6.5–8.1)

## 2016-04-14 LAB — LIPASE, BLOOD: LIPASE: 17 U/L (ref 11–51)

## 2016-04-14 LAB — POC OCCULT BLOOD, ED: Fecal Occult Bld: NEGATIVE

## 2016-04-14 MED ORDER — FAMOTIDINE IN NACL 20-0.9 MG/50ML-% IV SOLN
20.0000 mg | Freq: Once | INTRAVENOUS | Status: AC
  Administered 2016-04-14: 20 mg via INTRAVENOUS
  Filled 2016-04-14: qty 50

## 2016-04-14 MED ORDER — POLYETHYLENE GLYCOL 3350 17 G PO PACK: 17.0000 g | PACK | Freq: Every day | ORAL | 0 refills | Status: DC

## 2016-04-14 MED ORDER — MORPHINE SULFATE (PF) 4 MG/ML IV SOLN: 4.0000 mg | Freq: Once | INTRAVENOUS | Status: DC

## 2016-04-14 MED ORDER — SODIUM CHLORIDE 0.9 % IV BOLUS (SEPSIS): 500.0000 mL | Freq: Once | INTRAVENOUS | Status: DC

## 2016-04-14 MED ORDER — ONDANSETRON HCL 4 MG/2ML IJ SOLN: 4.0000 mg | Freq: Once | INTRAMUSCULAR | Status: DC

## 2016-04-14 MED ORDER — FAMOTIDINE 20 MG PO TABS: 20.0000 mg | ORAL_TABLET | Freq: Two times a day (BID) | ORAL | 0 refills | Status: DC

## 2016-04-14 MED ORDER — POLYETHYLENE GLYCOL 3350 17 G PO PACK
17.0000 g | PACK | Freq: Every day | ORAL | Status: DC
  Administered 2016-04-14: 17 g via ORAL
  Filled 2016-04-14: qty 1

## 2016-04-14 MED ORDER — SODIUM CHLORIDE 0.9 % IV SOLN: Freq: Once | INTRAVENOUS | Status: DC
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
## 2016-04-14 NOTE — ED Triage Notes (Signed)
Pt brought in via EMS. Per ems patient states he had his pacemaker changed a few days ago and ever since then it seems like he is constantly thirsty, having urinary frequency, and continues to feel flutters and mild pain in his upper epigastric area. EMS states he also verbalized that he has really bad anxiety and may be experiencing some of that in relation to having his pacemaker change and not knowing what to expect. He denies any chest pain, or pain anywhere else. Denies nausea, vomiting, or diarrhea. Denies any chest pain, light headedness, or dizziness. Denies any other urinary symptoms other than frequency. Per EMS VS: 128/80, 72, 99%RA, CBG 138. He is A&O x4 and lives alone at home.

## 2016-04-14 NOTE — ED Notes (Signed)
Patient aware urine sample is needed, patient states they are in pain. RN Loma Sousa made aware.

## 2016-04-14 NOTE — ED Notes (Signed)
Bed: EJ61 Expected date:  Expected time:  Means of arrival:  Comments: Abdominal pain and increased thirst

## 2016-04-18 ENCOUNTER — Ambulatory Visit (INDEPENDENT_AMBULATORY_CARE_PROVIDER_SITE_OTHER): Payer: Medicare Other | Admitting: *Deleted

## 2016-04-18 DIAGNOSIS — I5022 Chronic systolic (congestive) heart failure: Secondary | ICD-10-CM | POA: Diagnosis not present

## 2016-04-18 DIAGNOSIS — I482 Chronic atrial fibrillation, unspecified: Secondary | ICD-10-CM

## 2016-04-18 LAB — CUP PACEART INCLINIC DEVICE CHECK
Battery Voltage: 3.12 V
Brady Statistic RV Percent Paced: 0.36 %
HIGH POWER IMPEDANCE MEASURED VALUE: 39 Ohm
HighPow Impedance: 51 Ohm
Implantable Lead Implant Date: 20130419
Implantable Lead Location: 753859
Implantable Lead Model: 5076
Lead Channel Impedance Value: 342 Ohm
Lead Channel Impedance Value: 494 Ohm
Lead Channel Pacing Threshold Pulse Width: 0.4 ms
Lead Channel Setting Pacing Pulse Width: 0.4 ms
Lead Channel Setting Sensing Sensitivity: 0.3 mV
MDC IDC LEAD IMPLANT DT: 20130419
MDC IDC LEAD LOCATION: 753860
MDC IDC MSMT BATTERY REMAINING LONGEVITY: 137 mo
MDC IDC MSMT LEADCHNL RV PACING THRESHOLD AMPLITUDE: 0.75 V
MDC IDC MSMT LEADCHNL RV SENSING INTR AMPL: 10.75 mV
MDC IDC MSMT LEADCHNL RV SENSING INTR AMPL: 9.625 mV
MDC IDC PG IMPLANT DT: 20180309
MDC IDC SESS DTM: 20180319110838
MDC IDC SET LEADCHNL RV PACING AMPLITUDE: 2.5 V

## 2016-04-18 NOTE — Progress Notes (Signed)
Wound check appointment. Steri-strips removed prior to appt. Wound without redness or edema. Incision edges approximated, wound well healed. Normal device function. Threshold, sensing, and impedances consistent with implant measurements. Device programmed at appropriate safety margins. Histogram distribution appropriate for patient and level of activity. Permanent AF--no OAC d/t SDH. No ventricular arrhythmias noted. Patient educated about wound care, arm mobility, and shock plan. ROV in 3 months with GT.

## 2016-06-17 ENCOUNTER — Telehealth: Payer: Self-pay | Admitting: Hematology and Oncology

## 2016-06-17 ENCOUNTER — Ambulatory Visit (HOSPITAL_BASED_OUTPATIENT_CLINIC_OR_DEPARTMENT_OTHER): Payer: Medicare Other | Admitting: Hematology and Oncology

## 2016-06-17 ENCOUNTER — Other Ambulatory Visit (HOSPITAL_BASED_OUTPATIENT_CLINIC_OR_DEPARTMENT_OTHER): Payer: Medicare Other

## 2016-06-17 ENCOUNTER — Encounter: Payer: Self-pay | Admitting: Hematology and Oncology

## 2016-06-17 DIAGNOSIS — D509 Iron deficiency anemia, unspecified: Secondary | ICD-10-CM | POA: Diagnosis not present

## 2016-06-17 DIAGNOSIS — D72821 Monocytosis (symptomatic): Secondary | ICD-10-CM | POA: Diagnosis not present

## 2016-06-17 DIAGNOSIS — L989 Disorder of the skin and subcutaneous tissue, unspecified: Secondary | ICD-10-CM

## 2016-06-17 LAB — CBC WITH DIFFERENTIAL/PLATELET
BASO%: 0.7 % (ref 0.0–2.0)
BASOS ABS: 0.1 10*3/uL (ref 0.0–0.1)
EOS%: 1.3 % (ref 0.0–7.0)
Eosinophils Absolute: 0.1 10*3/uL (ref 0.0–0.5)
HEMATOCRIT: 42.6 % (ref 38.4–49.9)
HGB: 14.4 g/dL (ref 13.0–17.1)
LYMPH#: 1.9 10*3/uL (ref 0.9–3.3)
LYMPH%: 26.3 % (ref 14.0–49.0)
MCH: 30.3 pg (ref 27.2–33.4)
MCHC: 33.7 g/dL (ref 32.0–36.0)
MCV: 89.8 fL (ref 79.3–98.0)
MONO#: 1.8 10*3/uL — ABNORMAL HIGH (ref 0.1–0.9)
MONO%: 23.8 % — AB (ref 0.0–14.0)
NEUT#: 3.5 10*3/uL (ref 1.5–6.5)
NEUT%: 47.9 % (ref 39.0–75.0)
Platelets: 148 10*3/uL (ref 140–400)
RBC: 4.75 10*6/uL (ref 4.20–5.82)
RDW: 15 % — ABNORMAL HIGH (ref 11.0–14.6)
WBC: 7.4 10*3/uL (ref 4.0–10.3)
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
## 2016-06-17 NOTE — Assessment & Plan Note (Signed)
He has seen dermatologist and had it resected. His skin appears to be healing well. I recommend he continues  follow-up with dermatologist

## 2016-06-17 NOTE — Telephone Encounter (Signed)
Gave patient AVS and calender per 5/18 los. Lab and f/u in one year

## 2016-06-17 NOTE — Assessment & Plan Note (Signed)
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 He has chronic monocytosis for many years but he is not symptomatic. I will continue to see him on a yearly basis for monitoring.
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
## 2016-06-23 ENCOUNTER — Encounter: Payer: Self-pay | Admitting: Cardiology

## 2016-06-23 ENCOUNTER — Ambulatory Visit (INDEPENDENT_AMBULATORY_CARE_PROVIDER_SITE_OTHER): Payer: Medicare Other | Admitting: Cardiology

## 2016-06-23 VITALS — BP 114/56 | HR 76 | Ht 72.0 in | Wt 199.4 lb

## 2016-06-23 DIAGNOSIS — I4729 Other ventricular tachycardia: Secondary | ICD-10-CM

## 2016-06-23 DIAGNOSIS — Z95810 Presence of automatic (implantable) cardiac defibrillator: Secondary | ICD-10-CM | POA: Diagnosis not present

## 2016-06-23 DIAGNOSIS — I2510 Atherosclerotic heart disease of native coronary artery without angina pectoris: Secondary | ICD-10-CM

## 2016-06-23 DIAGNOSIS — I482 Chronic atrial fibrillation, unspecified: Secondary | ICD-10-CM

## 2016-06-23 DIAGNOSIS — I472 Ventricular tachycardia: Secondary | ICD-10-CM | POA: Diagnosis not present

## 2016-06-23 DIAGNOSIS — I5022 Chronic systolic (congestive) heart failure: Secondary | ICD-10-CM

## 2016-06-23 NOTE — Patient Instructions (Addendum)
Continue your current therapy   I will see you in 3 months. 

## 2016-07-04 DIAGNOSIS — R413 Other amnesia: Secondary | ICD-10-CM | POA: Diagnosis not present

## 2016-07-04 DIAGNOSIS — Z6828 Body mass index (BMI) 28.0-28.9, adult: Secondary | ICD-10-CM | POA: Diagnosis not present

## 2016-07-04 DIAGNOSIS — R260 Ataxic gait: Secondary | ICD-10-CM | POA: Diagnosis not present

## 2016-07-04 DIAGNOSIS — E1151 Type 2 diabetes mellitus with diabetic peripheral angiopathy without gangrene: Secondary | ICD-10-CM | POA: Diagnosis not present

## 2016-07-06 ENCOUNTER — Telehealth: Payer: Self-pay | Admitting: Cardiology

## 2016-07-06 NOTE — Telephone Encounter (Signed)
New message     pt verbalized that he is calling for rn   And he did not want to disclose the reason

## 2016-07-07 NOTE — Telephone Encounter (Signed)
Received call back from patient.He wanted to ask Dr.Jordan why he needs to come back in 3 months instead of 6 months.Advised I will ask Dr.Jordan when he is back in office.

## 2016-07-07 NOTE — Telephone Encounter (Signed)
Returned call to patient no answer.LMTC. 

## 2016-07-11 ENCOUNTER — Encounter: Payer: Self-pay | Admitting: Internal Medicine

## 2016-07-11 ENCOUNTER — Ambulatory Visit (INDEPENDENT_AMBULATORY_CARE_PROVIDER_SITE_OTHER): Payer: Medicare Other | Admitting: Internal Medicine

## 2016-07-11 ENCOUNTER — Encounter (INDEPENDENT_AMBULATORY_CARE_PROVIDER_SITE_OTHER): Payer: Self-pay

## 2016-07-11 VITALS — BP 120/60 | HR 89 | Ht 72.0 in | Wt 196.0 lb

## 2016-07-11 DIAGNOSIS — Z95810 Presence of automatic (implantable) cardiac defibrillator: Secondary | ICD-10-CM | POA: Diagnosis not present

## 2016-07-11 DIAGNOSIS — I4891 Unspecified atrial fibrillation: Secondary | ICD-10-CM | POA: Diagnosis not present

## 2016-07-11 DIAGNOSIS — I5022 Chronic systolic (congestive) heart failure: Secondary | ICD-10-CM

## 2016-07-11 DIAGNOSIS — I2510 Atherosclerotic heart disease of native coronary artery without angina pectoris: Secondary | ICD-10-CM

## 2016-07-11 NOTE — Progress Notes (Signed)
HPI [name] returns today for followup.he is a very pleasant 83 year old man with a history of chronic atrial fibrillation, symptomatic tachycardia bradycardia syndrome, VT, status post ICD implantation. He admits to some dietary indiscretion. No ICD therapies. He is now chronically in atrial fib. His only concern today is that he needs a primary MD. Allergies  Allergen Reactions  . Celebrex [Celecoxib] Hives and Other (See Comments)    Gi upset  . Esomeprazole Magnesium Hives and Other (See Comments)    Other reaction(s): Hives "don't really remember"  . Tamsulosin Other (See Comments)    Dizziness, Made BP very low and weakness  . Dronedarone Other (See Comments)    Other reaction(s): GI upset, abdominal pain  . Digoxin Other (See Comments)    Unknown   . Hydrocodone-Acetaminophen Other (See Comments)    Bad headache  . Protonix [Pantoprazole Sodium] Nausea And Vomiting    Tolerates Dexilant        Past Medical History:  Diagnosis Date  . Abnormal thyroid scan    Abnormal thyroid imaging studies from 11/09/2010, status post ultrasound guided fine needle aspiration of the dominant left inferior thyroid nodule on 12/15/2010. Cytology report showed rare follicular epithelial cells and hemosiderin laden macrophages.  . Arthritis    "all over"  . Asthma   . Atrial fibrillation ([place])    on chronic Coumadin; stopped July 2013 due to subdural hematomas  . CHF (congestive heart failure) (HCC)    EF 35-40% s/p most recent ICD generator change-out with Medtronic dual-chamber ICD 05/20/11 with explantation of previous abdominally-implanted device  . Coronary artery disease    s/p CABG 1983 and PCI/stent 2004.   . Diabetes mellitus    diet controlled  . Dyslipidemia   . Erythrocytosis   . GERD (gastroesophageal reflux disease)   . Hypertension   . Ischemic cardiomyopathy    WITH CHF  . Monocytosis 04/17/2013  . Myocardial infarction ([place]) 1983; ~ 1990  . 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  Pneumonia August 2013  . Subdural hematoma [facility]) July 2013   Anticoagulation stopped.   . VT (ventricular tachycardia) ([place])   . Wears glasses     ROS:   All systems reviewed and negative except as noted in the HPI.   Past Surgical History:  Procedure Laterality Date  . CHOLECYSTECTOMY    . COLONOSCOPY  07/07/2011   Procedure: COLONOSCOPY;  Surgeon: Jerene Bears, MD;  [facility];  Service: Gastroenterology;  Laterality: N/A;  . CORONARY ANGIOPLASTY WITH STENT PLACEMENT  2004   Tandem Cypher stents LAD  . CORONARY ARTERY BYPASS GRAFT  1983   SVG-mLAD  . ESOPHAGOGASTRODUODENOSCOPY  02/11/2011   Procedure: ESOPHAGOGASTRODUODENOSCOPY (EGD);  Surgeon: Beryle Beams, MD;  Location: [facility];  Service: Endoscopy;  Laterality: N/A;  . ICD GENERATOR CHANGEOUT N/A 04/08/2016   Procedure: ICD Generator Changeout;  Surgeon: Evans Lance, MD;  Location: [place] [facility];  Service: Cardiovascular;  Laterality: N/A;  . IMPLANTABLE CARDIOVERTER DEFIBRILLATOR (ICD) GENERATOR CHANGE N/A 05/20/2011   Procedure: ICD GENERATOR CHANGE;  Surgeon: Evans Lance, MD;  Medtronic secure dual-chamber ICD serial number [id number]   . KNEE ARTHROSCOPY     right; "just went in and scraped it"  . MASS EXCISION Right 05/10/2013   Procedure: EXCISION MASS RIGHT THUMB;  Surgeon: Wynonia Sours, MD;  Location: [facility];  Service: Orthopedics;  Laterality: Right;  . PROXIMAL INTERPHALANGEAL FUSION (PIP) Right 05/10/2013   Procedure: DEBRIDEMENT PROXIMAL  INTERPHALANGEAL FUSION (PIP);  Surgeon: Wynonia Sours, MD;  Location: [place];  Service: Orthopedics;  Laterality: Right;  . TONSILLECTOMY           Family History  Problem Relation Age of Onset  . Tuberculosis Mother   . Tuberculosis Father   . Heart disease Brother   . Diabetes Sister   . Diabetes Brother   . Clotting disorder Brother      Social History   Social History  . Marital status: Divorced     Spouse name: N/A  . Number of children: 2  . Years of education: N/A   Occupational History  . real estate Retired   Social History Main Topics  . Smoking status: Former Smoker    Packs/day: 2.00    Years: 30.00    Types: Cigarettes    Quit date: 06/23/1976  . Smokeless tobacco: Never Used  . Alcohol use No  . Drug use: No  . Sexual activity: No   Other Topics Concern  . Not on file   Social History Narrative  . No narrative on file     BP 120/60   Pulse 89   Ht 6' (1.829 m)   Wt 196 lb (88.9 kg)   SpO2 98%   BMI 26.58 kg/m   Physical Exam:  Well appearing 83 year old man, NAD HEENT: Unremarkable Neck: 7 cm JVD, no thyromegally Lungs:  Clear with no wheezes, rales, or rhonchi.Well healed subpectoral incision HEART:  IRegular rate rhythm, no murmurs, no rubs, no clicks Abd:  soft, positive bowel sounds, no organomegally, no rebound, no guarding Ext:  2 plus pulses, no edema, no cyanosis, no clubbing Skin:  No rashes no nodules Neuro:  CN II through XII intact, motor grossly intact  ECG - atrial fib with a CVR  DEVICE  Normal device function.  See PaceArt for details.   Assess/Plan: 1. VT - he has had no recurrent VT. He is off of amiodarone. 2. Atrial fib - he is chronically in atrial fib. His rates are controlled. 3. Chronic systolic heart failure - his optivol is up and he admits to missing some of his lasix.  4. ICD - his Medtronic ICD is working normally. He has undergone ICD gen change and his current device is working normally.   [name].D.

## 2016-07-11 NOTE — Patient Instructions (Addendum)
Medication Instructions:  Your physician recommends that you continue on your current medications as directed. Please refer to the Current Medication list given to you today.   Labwork: None Ordered    Testing/Procedures: None Ordered   Follow-Up: Your physician recommends that you schedule a follow-up appointment with PCP - Dr. Garret Reddish at [facility] at [place] [phone]   Your physician wants you to follow-up in: 9 months with Dr. Lovena Le. You will receive a reminder letter in the mail two months in advance. If you don't receive a letter, please call our office to schedule the follow-up appointment.  Remote monitoring is used to monitor your ICD from home. This monitoring reduces the number of office visits required to check your device to one time per year. It allows [place] to keep an eye on the functioning of your device to ensure it is working properly. You are scheduled for a device check from home on  10/10/16 . You may send your transmission at any time that day. If you have a wireless device, the transmission will be sent automatically. After your physician reviews your transmission, you will receive a postcard with your next transmission date.    Any Other Special Instructions Will Be Listed Below (If Applicable).     If you need a refill on your cardiac medications before your next appointment, please call your pharmacy.

## 2016-07-13 ENCOUNTER — Encounter: Payer: Self-pay | Admitting: Family Medicine

## 2016-07-13 ENCOUNTER — Ambulatory Visit (INDEPENDENT_AMBULATORY_CARE_PROVIDER_SITE_OTHER): Payer: Medicare Other | Admitting: Family Medicine

## 2016-07-13 VITALS — BP 118/60 | Temp 98.4°F | Ht 72.0 in | Wt 197.0 lb

## 2016-07-13 DIAGNOSIS — K5900 Constipation, unspecified: Secondary | ICD-10-CM | POA: Diagnosis not present

## 2016-07-13 DIAGNOSIS — D509 Iron deficiency anemia, unspecified: Secondary | ICD-10-CM

## 2016-07-13 DIAGNOSIS — D72821 Monocytosis (symptomatic): Secondary | ICD-10-CM | POA: Diagnosis not present

## 2016-07-13 DIAGNOSIS — Z8679 Personal history of other diseases of the circulatory system: Secondary | ICD-10-CM

## 2016-07-13 DIAGNOSIS — I4891 Unspecified atrial fibrillation: Secondary | ICD-10-CM

## 2016-07-13 DIAGNOSIS — K5909 Other constipation: Secondary | ICD-10-CM | POA: Insufficient documentation

## 2016-07-13 DIAGNOSIS — I482 Chronic atrial fibrillation, unspecified: Secondary | ICD-10-CM

## 2016-07-13 DIAGNOSIS — I5022 Chronic systolic (congestive) heart failure: Secondary | ICD-10-CM

## 2016-07-13 DIAGNOSIS — I2510 Atherosclerotic heart disease of native coronary artery without angina pectoris: Secondary | ICD-10-CM | POA: Diagnosis not present

## 2016-07-13 DIAGNOSIS — Z95810 Presence of automatic (implantable) cardiac defibrillator: Secondary | ICD-10-CM

## 2016-07-13 DIAGNOSIS — Z7901 Long term (current) use of anticoagulants: Secondary | ICD-10-CM | POA: Diagnosis not present

## 2016-07-13 NOTE — Progress Notes (Signed)
   Subjective:    Patient ID: [name], male    DOB: 11/11/1932, 83 y.o.   MRN: [id number]  HPI 83 yr old male to establish with [place] after transferring from Dr. Dagmar Hait. He is doing well in general. He sees Dr. Peter [name] and Dr. Crissie Sickles for chronic atrial fibrillation, CHF, and a hx of ventricular tachycardia. Due to an intracranial bleed he has been taken off all anticoagulant meds including aspirin.  He has an ICD and had the generator replaced in March. His EF is 35-40%. He has mild SOB on exertion but little leg edema. His BP and ventricular rate have been well controlled. His last potassium and creatinine in March were stable. He see Dr. Heath Lark for monocytosis that is stable. He has chrronic constipation, but he has done well on a combination of Miralax and Linzess. Sometimes the Linzess causes some diarrhea. His main concern is severe arthritis in his knees, the left being worse than the right. He uses a rolling walker. He gets around fairly well and still drives himself. He is divorced and has lived in his own home by himself for 20 years. 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    Review of Systems  Constitutional: Negative.   Respiratory: Negative.   Cardiovascular: Negative.   Gastrointestinal: Positive for constipation. Negative for abdominal distention and abdominal pain.  Genitourinary: Negative.   Musculoskeletal: Positive for arthralgias.  Neurological: Negative.        Objective:   Physical Exam  Constitutional: He is oriented to person, place, and time.  Alert, looks good   Neck: No thyromegaly present.  Cardiovascular: Normal rate, normal heart sounds and intact distal pulses.   No murmur heard. Irregular rhythm   Pulmonary/Chest: Effort normal and breath sounds normal. No respiratory distress. He has no wheezes. He has no rales.  Abdominal: Soft. Bowel sounds are normal. He exhibits no distension and no mass. There is no  tenderness. There is no rebound and no guarding.  Musculoskeletal:  Trace ankle edema   Lymphadenopathy:    He has no cervical adenopathy.  Neurological: He is alert and oriented to person, place, and time.  Psychiatric: He has a normal mood and affect. His behavior is normal. Thought content normal.          Assessment & Plan:  He seems to be doing well with multiple issues as listed above. He will follow up with Drs. [name], Taylor, and [place]. We agreed to see him every 3 months to stay on top of things. I suggested he take the Linzess every other day to avoid loose stools.  Alysia Penna, MD

## 2016-07-13 NOTE — Patient Instructions (Signed)
WE NOW OFFER   [facility] FAST TRACK!!!  SAME DAY Appointments for ACUTE CARE  Such as: Sprains, Injuries, cuts, abrasions, rashes, muscle pain, joint pain, back pain Colds, flu, sore throats, headache, allergies, cough, fever  Ear pain, sinus and eye infections Abdominal pain, nausea, vomiting, diarrhea, upset stomach Animal/insect bites  3 Easy Ways to Schedule: Walk-In Scheduling Call in scheduling Mychart Sign-up: [email or web address]         

## 2016-07-15 NOTE — Telephone Encounter (Signed)
Returned call to patient.Dr.Jordan advised to follow up in 6 months.

## 2016-07-22 ENCOUNTER — Other Ambulatory Visit: Payer: Self-pay | Admitting: Family Medicine

## 2016-07-22 NOTE — Telephone Encounter (Signed)
Pt need new Rx for Tramadol #90  Pharm:  [facility] [address]  **Pt would like to have all of his medication when calling in to be on a #90.

## 2016-08-01 ENCOUNTER — Other Ambulatory Visit: Payer: Self-pay | Admitting: Family Medicine

## 2016-08-02 NOTE — Telephone Encounter (Signed)
To take 2 tabs every 8 hours prn pain, call in #150 with 5 rf

## 2016-08-04 ENCOUNTER — Telehealth: Payer: Self-pay | Admitting: Family Medicine

## 2016-08-04 NOTE — Telephone Encounter (Signed)
No problem, please call in the order

## 2016-08-04 NOTE — Telephone Encounter (Signed)
Medication is Diclofenac gel.

## 2016-08-04 NOTE — Telephone Encounter (Signed)
What medication

## 2016-08-04 NOTE — Telephone Encounter (Signed)
I tried to process order, drug allergy alert appeared-allergy to Celecoxib. Can I still send in script?

## 2016-08-04 NOTE — Telephone Encounter (Signed)
Call in Diclofenac gel 1% to apply 2 grams QID prn pain, 100 gram tube with 11 rf

## 2016-08-04 NOTE — Telephone Encounter (Signed)
Received a fax from the pharmacy.  Pt requesting refills.  Was receiving from Dr. Dagmar Hait but that office has now referred him to his PCP for refills.  Requesting new rx with clear directions on how many grams to apply.

## 2016-08-05 MED ORDER — DICLOFENAC SODIUM 1 % TD GEL: 2.0000 g | Freq: Four times a day (QID) | TRANSDERMAL | 11 refills | Status: DC

## 2016-08-05 NOTE — Telephone Encounter (Signed)
[facility] 

## 2016-08-09 LAB — CUP PACEART INCLINIC DEVICE CHECK
Battery Remaining Longevity: 136 mo
Battery Voltage: 3.1 V
Brady Statistic RV Percent Paced: 2.06 %
HighPow Impedance: 41 Ohm
HighPow Impedance: 51 Ohm
Implantable Lead Implant Date: 20130419
Implantable Lead Location: 753859
Implantable Lead Model: 5076
Lead Channel Impedance Value: 380 Ohm
Lead Channel Impedance Value: 513 Ohm
Lead Channel Pacing Threshold Amplitude: 0.75 V
Lead Channel Sensing Intrinsic Amplitude: 10.125 mV
Lead Channel Setting Pacing Amplitude: 2.5 V
Lead Channel Setting Sensing Sensitivity: 0.3 mV
MDC IDC LEAD IMPLANT DT: 20130419
MDC IDC LEAD LOCATION: 753860
MDC IDC MSMT LEADCHNL RV PACING THRESHOLD PULSEWIDTH: 0.4 ms
MDC IDC MSMT LEADCHNL RV SENSING INTR AMPL: 10.5 mV
MDC IDC PG IMPLANT DT: 20180309
MDC IDC SESS DTM: 20180611151127
MDC IDC SET LEADCHNL RV PACING PULSEWIDTH: 0.4 ms

## 2016-08-11 DIAGNOSIS — M25561 Pain in right knee: Secondary | ICD-10-CM | POA: Diagnosis not present

## 2016-08-11 DIAGNOSIS — M25562 Pain in left knee: Secondary | ICD-10-CM | POA: Diagnosis not present

## 2016-08-11 DIAGNOSIS — M170 Bilateral primary osteoarthritis of knee: Secondary | ICD-10-CM | POA: Diagnosis not present

## 2016-08-11 DIAGNOSIS — R262 Difficulty in walking, not elsewhere classified: Secondary | ICD-10-CM | POA: Diagnosis not present

## 2016-08-15 DIAGNOSIS — M170 Bilateral primary osteoarthritis of knee: Secondary | ICD-10-CM | POA: Diagnosis not present

## 2016-08-15 DIAGNOSIS — M25561 Pain in right knee: Secondary | ICD-10-CM | POA: Diagnosis not present

## 2016-08-15 DIAGNOSIS — M25562 Pain in left knee: Secondary | ICD-10-CM | POA: Diagnosis not present

## 2016-08-22 DIAGNOSIS — M25561 Pain in right knee: Secondary | ICD-10-CM | POA: Diagnosis not present

## 2016-08-22 DIAGNOSIS — M170 Bilateral primary osteoarthritis of knee: Secondary | ICD-10-CM | POA: Diagnosis not present

## 2016-08-22 DIAGNOSIS — M25562 Pain in left knee: Secondary | ICD-10-CM | POA: Diagnosis not present

## 2016-09-09 ENCOUNTER — Telehealth: Payer: Self-pay | Admitting: Family Medicine

## 2016-09-09 NOTE — Telephone Encounter (Signed)
error 

## 2016-09-12 ENCOUNTER — Encounter: Payer: Self-pay | Admitting: Family Medicine

## 2016-09-12 ENCOUNTER — Ambulatory Visit (INDEPENDENT_AMBULATORY_CARE_PROVIDER_SITE_OTHER): Payer: Medicare Other | Admitting: Family Medicine

## 2016-09-12 VITALS — BP 123/84 | HR 82 | Temp 97.7°F | Ht 72.0 in | Wt 198.0 lb

## 2016-09-12 DIAGNOSIS — J029 Acute pharyngitis, unspecified: Secondary | ICD-10-CM | POA: Diagnosis not present

## 2016-09-12 DIAGNOSIS — I2510 Atherosclerotic heart disease of native coronary artery without angina pectoris: Secondary | ICD-10-CM | POA: Diagnosis not present

## 2016-09-12 DIAGNOSIS — K219 Gastro-esophageal reflux disease without esophagitis: Secondary | ICD-10-CM

## 2016-09-12 MED ORDER — RANITIDINE HCL 300 MG PO TABS: 300.0000 mg | ORAL_TABLET | Freq: Every day | ORAL | 11 refills | Status: DC

## 2016-09-12 NOTE — Patient Instructions (Signed)
WE NOW OFFER   [facility] FAST TRACK!!!  SAME DAY Appointments for ACUTE CARE  Such as: Sprains, Injuries, cuts, abrasions, rashes, muscle pain, joint pain, back pain Colds, flu, sore throats, headache, allergies, cough, fever  Ear pain, sinus and eye infections Abdominal pain, nausea, vomiting, diarrhea, upset stomach Animal/insect bites  3 Easy Ways to Schedule: Walk-In Scheduling Call in scheduling Mychart Sign-up: [email or web address]         

## 2016-09-12 NOTE — Progress Notes (Signed)
   Subjective:    Patient ID: [name], male    DOB: 1932/04/12, 83 y.o.   MRN: [id number]  HPI Here for a chronic sore throat that comes and goes. This has been a problem for several years. No sinus congestion or PND. No trouble swallowing. He has tried Clotrimazole troches with no relief. He admits to frequent heartburn and reflux of stomach contents into the back of his throat, especially in bed at night. He has tried OTC Zantac and Pepcid, Dexilant, and Protonix but these either did not work or caused side effects.    Review of Systems  Constitutional: Negative.   HENT: Positive for sore throat. Negative for congestion, postnasal drip, sinus pain, sinus pressure, trouble swallowing and voice change.   Eyes: Negative.   Respiratory: Negative.   Gastrointestinal: Negative.        Objective:   Physical Exam  Constitutional: He appears well-developed and well-nourished.          Assessment & Plan:  It appears that the source of his ST is nocturnal reflux. Try Zantac 300 mg qhs.  Alysia Penna, MD
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
## 2016-09-21 ENCOUNTER — Ambulatory Visit (INDEPENDENT_AMBULATORY_CARE_PROVIDER_SITE_OTHER): Payer: Medicare Other | Admitting: Cardiology

## 2016-09-21 ENCOUNTER — Encounter: Payer: Self-pay | Admitting: Cardiology

## 2016-09-21 VITALS — BP 108/70 | HR 82 | Ht 72.0 in | Wt 196.0 lb

## 2016-09-21 DIAGNOSIS — I2510 Atherosclerotic heart disease of native coronary artery without angina pectoris: Secondary | ICD-10-CM

## 2016-09-21 DIAGNOSIS — I5022 Chronic systolic (congestive) heart failure: Secondary | ICD-10-CM | POA: Diagnosis not present

## 2016-09-21 DIAGNOSIS — I472 Ventricular tachycardia: Secondary | ICD-10-CM | POA: Diagnosis not present

## 2016-09-21 DIAGNOSIS — I4891 Unspecified atrial fibrillation: Secondary | ICD-10-CM | POA: Diagnosis not present

## 2016-09-21 DIAGNOSIS — I4729 Other ventricular tachycardia: Secondary | ICD-10-CM

## 2016-09-21 DIAGNOSIS — Z95810 Presence of automatic (implantable) cardiac defibrillator: Secondary | ICD-10-CM | POA: Diagnosis not present

## 2016-09-21 NOTE — Patient Instructions (Signed)
Continue your current therapy  I will see you in 6 months.   

## 2016-10-01 ENCOUNTER — Encounter (HOSPITAL_COMMUNITY): Payer: Self-pay | Admitting: *Deleted

## 2016-10-01 ENCOUNTER — Emergency Department (HOSPITAL_COMMUNITY): Payer: Medicare Other

## 2016-10-01 ENCOUNTER — Emergency Department (HOSPITAL_COMMUNITY)
Admission: EM | Admit: 2016-10-01 | Discharge: 2016-10-01 | Disposition: A | Discharge status: Home / Self Care | Payer: Medicare Other | Attending: Emergency Medicine | Admitting: Emergency Medicine

## 2016-10-01 DIAGNOSIS — F17210 Nicotine dependence, cigarettes, uncomplicated: Secondary | ICD-10-CM | POA: Diagnosis not present

## 2016-10-01 DIAGNOSIS — J45909 Unspecified asthma, uncomplicated: Secondary | ICD-10-CM | POA: Insufficient documentation

## 2016-10-01 DIAGNOSIS — I252 Old myocardial infarction: Secondary | ICD-10-CM | POA: Insufficient documentation

## 2016-10-01 DIAGNOSIS — Z7982 Long term (current) use of aspirin: Secondary | ICD-10-CM | POA: Diagnosis not present

## 2016-10-01 DIAGNOSIS — I110 Hypertensive heart disease with heart failure: Secondary | ICD-10-CM | POA: Insufficient documentation

## 2016-10-01 DIAGNOSIS — I5022 Chronic systolic (congestive) heart failure: Secondary | ICD-10-CM | POA: Diagnosis not present

## 2016-10-01 DIAGNOSIS — Z951 Presence of aortocoronary bypass graft: Secondary | ICD-10-CM | POA: Diagnosis not present

## 2016-10-01 DIAGNOSIS — R1013 Epigastric pain: Secondary | ICD-10-CM | POA: Diagnosis not present

## 2016-10-01 DIAGNOSIS — Z79899 Other long term (current) drug therapy: Secondary | ICD-10-CM | POA: Insufficient documentation

## 2016-10-01 DIAGNOSIS — K5730 Diverticulosis of large intestine without perforation or abscess without bleeding: Secondary | ICD-10-CM | POA: Diagnosis not present

## 2016-10-01 DIAGNOSIS — I249 Acute ischemic heart disease, unspecified: Secondary | ICD-10-CM | POA: Insufficient documentation

## 2016-10-01 DIAGNOSIS — E119 Type 2 diabetes mellitus without complications: Secondary | ICD-10-CM | POA: Diagnosis not present

## 2016-10-01 LAB — COMPREHENSIVE METABOLIC PANEL
ALT: 10 U/L — ABNORMAL LOW (ref 17–63)
AST: 17 U/L (ref 15–41)
Albumin: 3.9 g/dL (ref 3.5–5.0)
Alkaline Phosphatase: 83 U/L (ref 38–126)
Anion gap: 7 (ref 5–15)
BUN: 18 mg/dL (ref 6–20)
CO2: 27 mmol/L (ref 22–32)
Calcium: 9.5 mg/dL (ref 8.9–10.3)
Chloride: 104 mmol/L (ref 101–111)
Creatinine, Ser: 1.2 mg/dL (ref 0.61–1.24)
GFR calc Af Amer: >60 mL/min (ref >60)
GFR calc non Af Amer: 55 mL/min — ABNORMAL LOW (ref >60)
Glucose, Bld: 121 mg/dL — ABNORMAL HIGH (ref 65–99)
Potassium: 3.8 mmol/L (ref 3.5–5.1)
Sodium: 138 mmol/L (ref 135–145)
Total Bilirubin: 0.7 mg/dL (ref 0.3–1.2)
Total Protein: 7.1 g/dL (ref 6.5–8.1)

## 2016-10-01 LAB — CBC WITH DIFFERENTIAL/PLATELET
Basophils Absolute: 0 10*3/uL (ref 0.0–0.1)
Basophils Relative: 0 %
Eosinophils Absolute: 0.1 10*3/uL (ref 0.0–0.7)
Eosinophils Relative: 1 %
HCT: 40.1 % (ref 39.0–52.0)
Hemoglobin: 13.6 g/dL (ref 13.0–17.0)
Lymphocytes Relative: 20 %
Lymphs Abs: 1.6 10*3/uL (ref 0.7–4.0)
MCH: 30.3 pg (ref 26.0–34.0)
MCHC: 33.9 g/dL (ref 30.0–36.0)
MCV: 89.3 fL (ref 78.0–100.0)
Monocytes Absolute: 2.1 10*3/uL — ABNORMAL HIGH (ref 0.1–1.0)
Monocytes Relative: 27 %
Neutro Abs: 4.1 10*3/uL (ref 1.7–7.7)
Neutrophils Relative %: 52 %
Platelets: 164 10*3/uL (ref 150–400)
RBC: 4.49 MIL/uL (ref 4.22–5.81)
RDW: 14.8 % (ref 11.5–15.5)
WBC: 7.9 10*3/uL (ref 4.0–10.5)

## 2016-10-01 LAB — I-STAT TROPONIN, ED
Troponin i, poc: 0.01 ng/mL (ref 0.00–0.08)
Troponin i, poc: 0 ng/mL (ref 0.00–0.08)

## 2016-10-01 LAB — URINALYSIS, ROUTINE W REFLEX MICROSCOPIC
Bacteria, UA: NONE SEEN
Bilirubin Urine: NEGATIVE
Glucose, UA: NEGATIVE mg/dL
Hgb urine dipstick: NEGATIVE
Ketones, ur: NEGATIVE mg/dL
Nitrite: NEGATIVE
Protein, ur: NEGATIVE mg/dL
Specific Gravity, Urine: 1.019 (ref 1.005–1.030)
Squamous Epithelial / LPF: NONE SEEN
pH: 7 (ref 5.0–8.0)

## 2016-10-01 LAB — LIPASE, BLOOD: Lipase: 33 U/L (ref 11–51)

## 2016-10-01 MED ORDER — RANITIDINE HCL 300 MG PO TABS: 300.0000 mg | ORAL_TABLET | Freq: Every day | ORAL | 0 refills | Status: DC

## 2016-10-01 MED ORDER — IOPAMIDOL (ISOVUE-300) INJECTION 61%
INTRAVENOUS | Status: AC
  Filled 2016-10-01: qty 100

## 2016-10-01 MED ORDER — GI COCKTAIL ~~LOC~~
30.0000 mL | Freq: Once | ORAL | Status: AC
  Administered 2016-10-01: 30 mL via ORAL
  Filled 2016-10-01: qty 30

## 2016-10-01 MED ORDER — IOPAMIDOL (ISOVUE-300) INJECTION 61%
100.0000 mL | Freq: Once | INTRAVENOUS | Status: AC | PRN
  Administered 2016-10-01: 100 mL via INTRAVENOUS
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
## 2016-10-01 NOTE — ED Notes (Signed)
Pacemaker interrogated. 

## 2016-10-01 NOTE — ED Notes (Signed)
Patient transported to CT 

## 2016-10-01 NOTE — Discharge Instructions (Signed)
Continue taking Zantac at night.eat a diet of bland foods that will not upset her stomach. Follow-up with your primary care physician for reevaluation in one week. Return to the ED immediately if any concerning signs or symptoms develop such as fevers, chest pain, shortness of breath, or blood in your urine or stool.

## 2016-10-01 NOTE — ED Triage Notes (Signed)
Pt complains of abdominal pain for the past 2 days. Pt denies n/v/d. Pt states he tried tramadol and aspirin w/o relief. Pt has not noticed blood in stool.

## 2016-10-04 ENCOUNTER — Telehealth: Payer: Self-pay | Admitting: Family Medicine

## 2016-10-04 ENCOUNTER — Telehealth: Payer: Self-pay | Admitting: Internal Medicine

## 2016-10-04 NOTE — Telephone Encounter (Signed)
New Message      1. Has your device fired? Yes - it shocked him   2. Is you device beeping? no  3. Are you experiencing draining or swelling at device site?  no  4. Are you calling to see if we received your device transmission? no   5. Have you passed out?  no

## 2016-10-04 NOTE — Telephone Encounter (Signed)
Patient called stating he needed the following meds refilled.  gabapentin (NEURONTIN) 100 MG capsule   Patient has an appointment schedule to see Dr. Sarajane Jews tomorrow. 10/05/2016

## 2016-10-04 NOTE — Telephone Encounter (Signed)
Spoke with patient who states that he felt like his device shocked him awake on Friday night 8/31 while he was asleep. He reports going to the ED on Saturday for some abdominal pain and that when they checked his device at the hospital it did not show a shock. I confirmed that this was the case. He states he feels fine and verbalized understanding. I encouraged him to call if he ever feels like he got shocked but that we would continue to monitor.

## 2016-10-05 ENCOUNTER — Ambulatory Visit (INDEPENDENT_AMBULATORY_CARE_PROVIDER_SITE_OTHER): Payer: Medicare Other | Admitting: Family Medicine

## 2016-10-05 ENCOUNTER — Encounter: Payer: Self-pay | Admitting: Family Medicine

## 2016-10-05 VITALS — BP 150/84 | Temp 97.8°F | Ht 72.0 in | Wt 195.0 lb

## 2016-10-05 DIAGNOSIS — R1013 Epigastric pain: Secondary | ICD-10-CM | POA: Diagnosis not present

## 2016-10-05 DIAGNOSIS — I2510 Atherosclerotic heart disease of native coronary artery without angina pectoris: Secondary | ICD-10-CM | POA: Diagnosis not present

## 2016-10-05 DIAGNOSIS — Z23 Encounter for immunization: Secondary | ICD-10-CM

## 2016-10-05 DIAGNOSIS — K219 Gastro-esophageal reflux disease without esophagitis: Secondary | ICD-10-CM | POA: Diagnosis not present

## 2016-10-05 MED ORDER — RANITIDINE HCL 300 MG PO TABS: 300.0000 mg | ORAL_TABLET | Freq: Every day | ORAL | 3 refills | Status: DC

## 2016-10-05 MED ORDER — GABAPENTIN 100 MG PO CAPS: ORAL_CAPSULE | ORAL | 5 refills | Status: DC

## 2016-10-05 NOTE — Telephone Encounter (Signed)
Please refill for 6 months 

## 2016-10-05 NOTE — Telephone Encounter (Signed)
I sent script e-scribe to [facility]. 

## 2016-10-05 NOTE — Telephone Encounter (Signed)
Can we refill, pt was just here this morning?

## 2016-10-05 NOTE — Progress Notes (Signed)
   Subjective:    Patient ID: [name], male    DOB: 1932/03/30, 83 y.o.   MRN: [id number]  HPI Here to follow up an ER visit on 10-01-16 for 2 days of intermittent epigastric pains. No fever, no N or V. His BMs were normal and urinations were normal. On exam he was mildly tender in the upper abdomen but was otherwise okay. Labs were unremarkable and a CT scan was normal. He had prompt relief of his pain after they gave him a GI cocktail, and it  was determined he was having GERD and he was told to take Zantac. He has not taken this however, and he says he has had a few milder epigastric pains since then. He had a little pain last night after eating [place] and gravy, then again this morning after eating an egg and sausage biscuit. He gets relief with TUMS.    Review of Systems  Constitutional: Negative.   Respiratory: Negative.   Cardiovascular: Negative.   Gastrointestinal: Positive for abdominal pain. Negative for abdominal distention, anal bleeding, blood in stool, constipation, diarrhea, nausea, rectal pain and vomiting.  Neurological: Negative.        Objective:   Physical Exam  Constitutional: He is oriented to person, place, and time. He appears well-developed and well-nourished.  Neck: No thyromegaly present.  Cardiovascular: Normal rate, regular rhythm, normal heart sounds and intact distal pulses.   Pulmonary/Chest: Effort normal and breath sounds normal. No respiratory distress. He has no wheezes. He has no rales.  Abdominal: Soft. Bowel sounds are normal. He exhibits no distension and no mass. There is no tenderness. There is no rebound and no guarding.  Lymphadenopathy:    He has no cervical adenopathy.  Neurological: He is alert and oriented to person, place, and time.          Assessment & Plan:  He is having epigastric pains consistent with GERD. I advised him to take Ranitidine on a daily basis to prevent these episodes and he agreed. Recheck prn. Alysia Penna, MD

## 2016-10-10 ENCOUNTER — Ambulatory Visit (INDEPENDENT_AMBULATORY_CARE_PROVIDER_SITE_OTHER): Payer: Medicare Other | Admitting: *Deleted

## 2016-10-10 DIAGNOSIS — I472 Ventricular tachycardia: Secondary | ICD-10-CM | POA: Diagnosis not present

## 2016-10-10 DIAGNOSIS — I4729 Other ventricular tachycardia: Secondary | ICD-10-CM

## 2016-10-10 NOTE — Progress Notes (Signed)
Remote ICD transmission.   

## 2016-10-11 LAB — CUP PACEART REMOTE DEVICE CHECK
Battery Remaining Longevity: 135 mo
Brady Statistic RV Percent Paced: 4.89 %
HIGH POWER IMPEDANCE MEASURED VALUE: 43 Ohm
HIGH POWER IMPEDANCE MEASURED VALUE: 53 Ohm
Implantable Lead Implant Date: 20130419
Implantable Lead Location: 753859
Implantable Lead Model: 5076
Implantable Lead Model: 6947
Implantable Pulse Generator Implant Date: 20180309
Lead Channel Impedance Value: 399 Ohm
Lead Channel Impedance Value: 532 Ohm
Lead Channel Sensing Intrinsic Amplitude: 11 mV
Lead Channel Setting Pacing Amplitude: 2.5 V
Lead Channel Setting Pacing Pulse Width: 0.4 ms
Lead Channel Setting Sensing Sensitivity: 0.3 mV
MDC IDC LEAD IMPLANT DT: 20130419
MDC IDC LEAD LOCATION: 753860
MDC IDC MSMT BATTERY VOLTAGE: 3.06 V
MDC IDC MSMT LEADCHNL RV PACING THRESHOLD AMPLITUDE: 0.75 V
MDC IDC MSMT LEADCHNL RV PACING THRESHOLD PULSEWIDTH: 0.4 ms
MDC IDC MSMT LEADCHNL RV SENSING INTR AMPL: 11 mV
MDC IDC SESS DTM: 20180910041705

## 2016-10-12 ENCOUNTER — Encounter: Payer: Self-pay | Admitting: Cardiology

## 2016-11-08 DIAGNOSIS — H5203 Hypermetropia, bilateral: Secondary | ICD-10-CM | POA: Diagnosis not present

## 2016-11-08 DIAGNOSIS — H2513 Age-related nuclear cataract, bilateral: Secondary | ICD-10-CM | POA: Diagnosis not present

## 2016-11-08 DIAGNOSIS — E119 Type 2 diabetes mellitus without complications: Secondary | ICD-10-CM | POA: Diagnosis not present

## 2016-11-10 ENCOUNTER — Encounter (HOSPITAL_COMMUNITY): Payer: Self-pay

## 2016-11-10 ENCOUNTER — Inpatient Hospital Stay (HOSPITAL_COMMUNITY)
Admission: EM | Admit: 2016-11-10 | Discharge: 2016-11-14 | DRG: 310 | Disposition: A | Discharge status: Home / Self Care | Payer: Medicare Other | Attending: Internal Medicine | Admitting: Internal Medicine

## 2016-11-10 ENCOUNTER — Telehealth: Payer: Self-pay | Admitting: Cardiology

## 2016-11-10 ENCOUNTER — Emergency Department (HOSPITAL_COMMUNITY): Payer: Medicare Other

## 2016-11-10 DIAGNOSIS — Z832 Family history of diseases of the blood and blood-forming organs and certain disorders involving the immune mechanism: Secondary | ICD-10-CM

## 2016-11-10 DIAGNOSIS — T82190A Other mechanical complication of cardiac electrode, initial encounter: Principal | ICD-10-CM | POA: Diagnosis present

## 2016-11-10 DIAGNOSIS — Z7951 Long term (current) use of inhaled steroids: Secondary | ICD-10-CM

## 2016-11-10 DIAGNOSIS — Z4502 Encounter for adjustment and management of automatic implantable cardiac defibrillator: Secondary | ICD-10-CM | POA: Diagnosis not present

## 2016-11-10 DIAGNOSIS — Z981 Arthrodesis status: Secondary | ICD-10-CM

## 2016-11-10 DIAGNOSIS — R9431 Abnormal electrocardiogram [ECG] [EKG]: Secondary | ICD-10-CM | POA: Diagnosis not present

## 2016-11-10 DIAGNOSIS — Z951 Presence of aortocoronary bypass graft: Secondary | ICD-10-CM

## 2016-11-10 DIAGNOSIS — Z95810 Presence of automatic (implantable) cardiac defibrillator: Secondary | ICD-10-CM

## 2016-11-10 DIAGNOSIS — J45909 Unspecified asthma, uncomplicated: Secondary | ICD-10-CM | POA: Diagnosis present

## 2016-11-10 DIAGNOSIS — Z87891 Personal history of nicotine dependence: Secondary | ICD-10-CM

## 2016-11-10 DIAGNOSIS — I482 Chronic atrial fibrillation: Secondary | ICD-10-CM | POA: Diagnosis present

## 2016-11-10 DIAGNOSIS — I2510 Atherosclerotic heart disease of native coronary artery without angina pectoris: Secondary | ICD-10-CM | POA: Diagnosis present

## 2016-11-10 DIAGNOSIS — Z9889 Other specified postprocedural states: Secondary | ICD-10-CM

## 2016-11-10 DIAGNOSIS — I110 Hypertensive heart disease with heart failure: Secondary | ICD-10-CM | POA: Diagnosis present

## 2016-11-10 DIAGNOSIS — Z9049 Acquired absence of other specified parts of digestive tract: Secondary | ICD-10-CM

## 2016-11-10 DIAGNOSIS — T82110A Breakdown (mechanical) of cardiac electrode, initial encounter: Secondary | ICD-10-CM | POA: Diagnosis not present

## 2016-11-10 DIAGNOSIS — Z8249 Family history of ischemic heart disease and other diseases of the circulatory system: Secondary | ICD-10-CM

## 2016-11-10 DIAGNOSIS — K219 Gastro-esophageal reflux disease without esophagitis: Secondary | ICD-10-CM | POA: Diagnosis present

## 2016-11-10 DIAGNOSIS — Z7901 Long term (current) use of anticoagulants: Secondary | ICD-10-CM

## 2016-11-10 DIAGNOSIS — I255 Ischemic cardiomyopathy: Secondary | ICD-10-CM | POA: Diagnosis present

## 2016-11-10 DIAGNOSIS — E785 Hyperlipidemia, unspecified: Secondary | ICD-10-CM | POA: Diagnosis present

## 2016-11-10 DIAGNOSIS — Z79899 Other long term (current) drug therapy: Secondary | ICD-10-CM

## 2016-11-10 DIAGNOSIS — T849XXA Unspecified complication of internal orthopedic prosthetic device, implant and graft, initial encounter: Secondary | ICD-10-CM | POA: Diagnosis not present

## 2016-11-10 DIAGNOSIS — E119 Type 2 diabetes mellitus without complications: Secondary | ICD-10-CM | POA: Diagnosis not present

## 2016-11-10 DIAGNOSIS — T82118A Breakdown (mechanical) of other cardiac electronic device, initial encounter: Secondary | ICD-10-CM | POA: Diagnosis not present

## 2016-11-10 DIAGNOSIS — I509 Heart failure, unspecified: Secondary | ICD-10-CM | POA: Diagnosis present

## 2016-11-10 DIAGNOSIS — Y712 Prosthetic and other implants, materials and accessory cardiovascular devices associated with adverse incidents: Secondary | ICD-10-CM | POA: Diagnosis present

## 2016-11-10 DIAGNOSIS — T82599A Other mechanical complication of unspecified cardiac and vascular devices and implants, initial encounter: Secondary | ICD-10-CM | POA: Diagnosis not present

## 2016-11-10 DIAGNOSIS — I501 Left ventricular failure: Secondary | ICD-10-CM | POA: Diagnosis not present

## 2016-11-10 DIAGNOSIS — I252 Old myocardial infarction: Secondary | ICD-10-CM

## 2016-11-10 DIAGNOSIS — M1990 Unspecified osteoarthritis, unspecified site: Secondary | ICD-10-CM | POA: Diagnosis present

## 2016-11-10 DIAGNOSIS — Z955 Presence of coronary angioplasty implant and graft: Secondary | ICD-10-CM

## 2016-11-10 DIAGNOSIS — Z888 Allergy status to other drugs, medicaments and biological substances status: Secondary | ICD-10-CM

## 2016-11-10 DIAGNOSIS — Z8679 Personal history of other diseases of the circulatory system: Secondary | ICD-10-CM

## 2016-11-10 DIAGNOSIS — Z833 Family history of diabetes mellitus: Secondary | ICD-10-CM

## 2016-11-10 LAB — COMPREHENSIVE METABOLIC PANEL
ALT: 9 U/L — ABNORMAL LOW (ref 17–63)
ANION GAP: 10 (ref 5–15)
AST: 19 U/L (ref 15–41)
Albumin: 3.8 g/dL (ref 3.5–5.0)
Alkaline Phosphatase: 106 U/L (ref 38–126)
BUN: 21 mg/dL — AB (ref 6–20)
CHLORIDE: 100 mmol/L — AB (ref 101–111)
CO2: 25 mmol/L (ref 22–32)
CREATININE: 1.47 mg/dL — AB (ref 0.61–1.24)
Calcium: 8.8 mg/dL — ABNORMAL LOW (ref 8.9–10.3)
GFR, EST AFRICAN AMERICAN: 50 mL/min — AB (ref >60)
GFR, EST NON AFRICAN AMERICAN: 43 mL/min — AB (ref >60)
Glucose, Bld: 140 mg/dL — ABNORMAL HIGH (ref 65–99)
POTASSIUM: 4.2 mmol/L (ref 3.5–5.1)
Sodium: 135 mmol/L (ref 135–145)
TOTAL PROTEIN: 6.8 g/dL (ref 6.5–8.1)
Total Bilirubin: 1 mg/dL (ref 0.3–1.2)

## 2016-11-10 LAB — CBC WITH DIFFERENTIAL/PLATELET
BASOS ABS: 0 10*3/uL (ref 0.0–0.1)
Basophils Relative: 0 %
EOS PCT: 1 %
Eosinophils Absolute: 0.1 10*3/uL (ref 0.0–0.7)
HCT: 41.8 % (ref 39.0–52.0)
Hemoglobin: 12.1 g/dL — ABNORMAL LOW (ref 13.0–17.0)
LYMPHS ABS: 2.2 10*3/uL (ref 0.7–4.0)
LYMPHS PCT: 30 %
MCH: 26.4 pg (ref 26.0–34.0)
MCHC: 28.9 g/dL — AB (ref 30.0–36.0)
MCV: 91.3 fL (ref 78.0–100.0)
MONO ABS: 1.9 10*3/uL — AB (ref 0.1–1.0)
Monocytes Relative: 27 %
NEUTROS ABS: 3 10*3/uL (ref 1.7–7.7)
Neutrophils Relative %: 42 %
PLATELETS: 142 10*3/uL — AB (ref 150–400)
RBC: 4.58 MIL/uL (ref 4.22–5.81)
RDW: 14.8 % (ref 11.5–15.5)
WBC: 7.2 10*3/uL (ref 4.0–10.5)

## 2016-11-10 LAB — PROTIME-INR
INR: 1.1
Prothrombin Time: 14.1 seconds (ref 11.4–15.2)

## 2016-11-10 LAB — I-STAT TROPONIN, ED: TROPONIN I, POC: 0 ng/mL (ref 0.00–0.08)

## 2016-11-10 LAB — APTT: aPTT: 34 seconds (ref 24–36)

## 2016-11-10 MED ORDER — ENOXAPARIN SODIUM 40 MG/0.4ML ~~LOC~~ SOLN
40.0000 mg | SUBCUTANEOUS | Status: DC
  Administered 2016-11-10 – 2016-11-12 (×3): 40 mg via SUBCUTANEOUS
  Filled 2016-11-10 (×3): qty 0.4

## 2016-11-10 NOTE — ED Notes (Signed)
Medtronic at the bedside. Pt on portable telemetry box at this time

## 2016-11-10 NOTE — ED Notes (Signed)
XR notified pt ready for scan 
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
## 2016-11-10 NOTE — ED Triage Notes (Signed)
Pt from home via EMS for a potential defibrillator problem. Per EMS, pt received a call from his provider advising him to go to the ED due to a "defbrillator malfunction". Per EMS, pt might have a displaced lead. Pt denies SOB, CP, palpitations, and reports his ICD has not fired today. Pt A&Ox4. NAD noted. EMS VS: 128/65, 95% on RA, 82 HR Afib.

## 2016-11-10 NOTE — Telephone Encounter (Signed)
Received a call from [employer] r/t ICD alert. Rep stated that patient has a lead fracture and there is significant concern for possible inappropriate shock. I called the patient, and no reported shocks. He had no one to drive him to the ER. Instructed to call EMS to transport in as the weather is also unfavorable at this time. [employer] rep in house contacted and will see patient to turn off ICD when he arrives at the hospital.   Reino Bellis NP

## 2016-11-10 NOTE — ED Notes (Signed)
Attempted report x1. 

## 2016-11-10 NOTE — ED Notes (Signed)
Pt ambulatory to restroom with cane. Denies dizziness or difficulty ambulating.
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
## 2016-11-10 NOTE — ED Notes (Signed)
Cardiology at bedside.

## 2016-11-10 NOTE — ED Notes (Signed)
Patient transported to X-ray 

## 2016-11-11 DIAGNOSIS — Z981 Arthrodesis status: Secondary | ICD-10-CM | POA: Diagnosis not present

## 2016-11-11 DIAGNOSIS — I255 Ischemic cardiomyopathy: Secondary | ICD-10-CM | POA: Diagnosis present

## 2016-11-11 DIAGNOSIS — Z951 Presence of aortocoronary bypass graft: Secondary | ICD-10-CM | POA: Diagnosis not present

## 2016-11-11 DIAGNOSIS — T82190A Other mechanical complication of cardiac electrode, initial encounter: Secondary | ICD-10-CM | POA: Diagnosis present

## 2016-11-11 DIAGNOSIS — Z79899 Other long term (current) drug therapy: Secondary | ICD-10-CM | POA: Diagnosis not present

## 2016-11-11 DIAGNOSIS — Z7901 Long term (current) use of anticoagulants: Secondary | ICD-10-CM | POA: Diagnosis not present

## 2016-11-11 DIAGNOSIS — Y712 Prosthetic and other implants, materials and accessory cardiovascular devices associated with adverse incidents: Secondary | ICD-10-CM | POA: Diagnosis present

## 2016-11-11 DIAGNOSIS — E119 Type 2 diabetes mellitus without complications: Secondary | ICD-10-CM | POA: Diagnosis present

## 2016-11-11 DIAGNOSIS — I482 Chronic atrial fibrillation: Secondary | ICD-10-CM | POA: Diagnosis not present

## 2016-11-11 DIAGNOSIS — Z7189 Other specified counseling: Secondary | ICD-10-CM | POA: Diagnosis not present

## 2016-11-11 DIAGNOSIS — E785 Hyperlipidemia, unspecified: Secondary | ICD-10-CM | POA: Diagnosis present

## 2016-11-11 DIAGNOSIS — I252 Old myocardial infarction: Secondary | ICD-10-CM | POA: Diagnosis not present

## 2016-11-11 DIAGNOSIS — J45909 Unspecified asthma, uncomplicated: Secondary | ICD-10-CM | POA: Diagnosis present

## 2016-11-11 DIAGNOSIS — I472 Ventricular tachycardia: Secondary | ICD-10-CM | POA: Diagnosis not present

## 2016-11-11 DIAGNOSIS — Z7951 Long term (current) use of inhaled steroids: Secondary | ICD-10-CM | POA: Diagnosis not present

## 2016-11-11 DIAGNOSIS — T82110A Breakdown (mechanical) of cardiac electrode, initial encounter: Secondary | ICD-10-CM | POA: Diagnosis not present

## 2016-11-11 DIAGNOSIS — Z955 Presence of coronary angioplasty implant and graft: Secondary | ICD-10-CM | POA: Diagnosis not present

## 2016-11-11 DIAGNOSIS — M1990 Unspecified osteoarthritis, unspecified site: Secondary | ICD-10-CM | POA: Diagnosis present

## 2016-11-11 DIAGNOSIS — Z888 Allergy status to other drugs, medicaments and biological substances status: Secondary | ICD-10-CM | POA: Diagnosis not present

## 2016-11-11 DIAGNOSIS — I10 Essential (primary) hypertension: Secondary | ICD-10-CM | POA: Diagnosis not present

## 2016-11-11 DIAGNOSIS — Z8679 Personal history of other diseases of the circulatory system: Secondary | ICD-10-CM | POA: Diagnosis not present

## 2016-11-11 DIAGNOSIS — K219 Gastro-esophageal reflux disease without esophagitis: Secondary | ICD-10-CM | POA: Diagnosis present

## 2016-11-11 DIAGNOSIS — Z832 Family history of diseases of the blood and blood-forming organs and certain disorders involving the immune mechanism: Secondary | ICD-10-CM | POA: Diagnosis not present

## 2016-11-11 DIAGNOSIS — Z87891 Personal history of nicotine dependence: Secondary | ICD-10-CM | POA: Diagnosis not present

## 2016-11-11 DIAGNOSIS — Z8249 Family history of ischemic heart disease and other diseases of the circulatory system: Secondary | ICD-10-CM | POA: Diagnosis not present

## 2016-11-11 DIAGNOSIS — Z9889 Other specified postprocedural states: Secondary | ICD-10-CM | POA: Diagnosis not present

## 2016-11-11 DIAGNOSIS — I2510 Atherosclerotic heart disease of native coronary artery without angina pectoris: Secondary | ICD-10-CM | POA: Diagnosis present

## 2016-11-11 DIAGNOSIS — Z9049 Acquired absence of other specified parts of digestive tract: Secondary | ICD-10-CM | POA: Diagnosis not present

## 2016-11-11 DIAGNOSIS — Z833 Family history of diabetes mellitus: Secondary | ICD-10-CM | POA: Diagnosis not present

## 2016-11-11 LAB — GLUCOSE, CAPILLARY
GLUCOSE-CAPILLARY: 108 mg/dL — AB (ref 65–99)
Glucose-Capillary: 126 mg/dL — ABNORMAL HIGH (ref 65–99)
Glucose-Capillary: 100 mg/dL — ABNORMAL HIGH (ref 65–99)
Glucose-Capillary: 117 mg/dL — ABNORMAL HIGH (ref 65–99)

## 2016-11-11 LAB — MRSA PCR SCREENING: MRSA BY PCR: NEGATIVE

## 2016-11-11 MED ORDER — FUROSEMIDE 40 MG PO TABS: 40.0000 mg | ORAL_TABLET | Freq: Every day | ORAL | Status: DC

## 2016-11-11 MED ORDER — LINACLOTIDE 72 MCG PO CAPS
72.0000 ug | ORAL_CAPSULE | Freq: Every day | ORAL | Status: DC
  Administered 2016-11-12 – 2016-11-13 (×2): 72 ug via ORAL
  Filled 2016-11-11 (×3): qty 1

## 2016-11-11 MED ORDER — NITROGLYCERIN 0.4 MG SL SUBL: 0.4000 mg | SUBLINGUAL_TABLET | SUBLINGUAL | Status: DC | PRN

## 2016-11-11 MED ORDER — MOMETASONE FURO-FORMOTEROL FUM 200-5 MCG/ACT IN AERO
2.0000 | INHALATION_SPRAY | Freq: Two times a day (BID) | RESPIRATORY_TRACT | Status: DC
  Administered 2016-11-11 – 2016-11-14 (×6): 2 via RESPIRATORY_TRACT
  Filled 2016-11-11 (×2): qty 8.8

## 2016-11-11 MED ORDER — GABAPENTIN 100 MG PO CAPS
100.0000 mg | ORAL_CAPSULE | Freq: Every evening | ORAL | Status: DC | PRN
  Administered 2016-11-12: 100 mg via ORAL
  Filled 2016-11-11: qty 1

## 2016-11-11 MED ORDER — CALCIUM CARBONATE ANTACID 500 MG PO CHEW
3.0000 | CHEWABLE_TABLET | Freq: Every day | ORAL | Status: DC | PRN
  Administered 2016-11-12: 600 mg via ORAL
  Filled 2016-11-11: qty 3

## 2016-11-11 MED ORDER — FAMOTIDINE 20 MG PO TABS
40.0000 mg | ORAL_TABLET | Freq: Every day | ORAL | Status: DC
  Administered 2016-11-11 – 2016-11-14 (×4): 40 mg via ORAL
  Filled 2016-11-11 (×4): qty 2

## 2016-11-11 MED ORDER — CARVEDILOL 12.5 MG PO TABS
12.5000 mg | ORAL_TABLET | Freq: Two times a day (BID) | ORAL | Status: DC
  Administered 2016-11-11 – 2016-11-14 (×6): 12.5 mg via ORAL
  Filled 2016-11-11 (×6): qty 1

## 2016-11-11 MED ORDER — PHENOL 1.4 % MT LIQD
1.0000 | OROMUCOSAL | Status: DC | PRN
  Filled 2016-11-11: qty 177

## 2016-11-11 MED ORDER — LORATADINE 10 MG PO TABS
10.0000 mg | ORAL_TABLET | Freq: Every day | ORAL | Status: DC
  Administered 2016-11-11 – 2016-11-13 (×3): 10 mg via ORAL
  Filled 2016-11-11 (×3): qty 1

## 2016-11-11 MED ORDER — LISINOPRIL 10 MG PO TABS
10.0000 mg | ORAL_TABLET | Freq: Every day | ORAL | Status: DC
  Administered 2016-11-11 – 2016-11-14 (×4): 10 mg via ORAL
  Filled 2016-11-11 (×4): qty 1

## 2016-11-11 MED ORDER — POLYETHYLENE GLYCOL 3350 17 G PO PACK
17.0000 g | PACK | Freq: Every day | ORAL | Status: DC
  Administered 2016-11-11 – 2016-11-13 (×3): 17 g via ORAL
  Filled 2016-11-11 (×3): qty 1

## 2016-11-11 MED ORDER — POTASSIUM CHLORIDE CRYS ER 10 MEQ PO TBCR
10.0000 meq | EXTENDED_RELEASE_TABLET | Freq: Every day | ORAL | Status: DC
  Administered 2016-11-11 – 2016-11-13 (×3): 10 meq via ORAL
  Filled 2016-11-11 (×3): qty 1

## 2016-11-11 MED ORDER — ALBUTEROL SULFATE (2.5 MG/3ML) 0.083% IN NEBU: 2.5000 mg | INHALATION_SOLUTION | Freq: Four times a day (QID) | RESPIRATORY_TRACT | Status: DC | PRN

## 2016-11-11 MED ORDER — CYCLOSPORINE 0.05 % OP EMUL
1.0000 [drp] | Freq: Two times a day (BID) | OPHTHALMIC | Status: DC
  Administered 2016-11-11 (×2): 1 [drp] via OPHTHALMIC
  Administered 2016-11-12: 2 [drp] via OPHTHALMIC
  Administered 2016-11-13 (×2): 1 [drp] via OPHTHALMIC
  Filled 2016-11-11 (×8): qty 1

## 2016-11-11 NOTE — Care Management Note (Signed)
Case Management Note  Patient Details  Name: [name] MRN: [id number] Date of Birth: [birth year]  Subjective/Objective:                 Spoke with patient at the bedside, he come from home, lives alone, has cane at bedside, states he has a rollator and a RW in his car, he states he also has a shower seat. He drives, to grocery stores etc. He states his son lives in [place], and sisters and him "do not get along." He states he is not currently active w [facility] services, has used [facility] several years ago but cannot remember who it was.    Action/Plan:  CM will continue to follow for DC planning needs.   Expected Discharge Date:                  Expected Discharge Plan:  Home/Self Care  In-House Referral:     Discharge planning Services  CM Consult  Post Acute Care Choice:    Choice offered to:     DME Arranged:    DME Agency:     HH Arranged:    HH Agency:     Status of Service:  In process, will continue to follow  If discussed at Long Length of Stay Meetings, dates discussed:    Additional Comments:  Carles Collet, RN 11/11/2016, 9:03 AM

## 2016-11-11 NOTE — Consult Note (Signed)
Cardiology Consultation:   Patient ID: KASPIAN MUCCIO; [id number]; 1932-08-22   Admit date: 11/10/2016 Date of Consult: 11/11/2016  Primary Care Provider: [name], MD Primary Cardiologist: Dr. [name] Primary Electrophysiologist:  Dr. Lovena Le   Patient Profile:   LAURENT CARGILE is a 83 y.o. male with a hx of chronic CHF (systolic), CAD (CABG 2409/BDZ 2004), permanent AFib (not felt to be a/c candidate a/p SDH), ICM, VT w/ICD, SDH (July 2013), chronic anemia, arthritis, HTN, HLD, who is being seen today for the evaluation of ICD lead fracture at the request of Dr. Wynonia Lawman.  History of Present Illness:   Mr. Haas was contacted last night via telephone 2/2 device alert that was brought to our attention via [employer] noting likely lead fracture and with concerns for inappropriate shocks, was advised to come to the hospital (was after office hours) to have therapies turned off, and get EP input and evaluation.  The patient has felt well, denies nay kind of CP, palpitations or SOB.  No dizziness, near syncope or syncope.  Device information: MDT dual chamber ICD implanted 2013, gen change march 2018 (originally an epicardial system 1993, 1997, 2004 that was abandoned)  Device interrogation: Battery status is good, RV lead impedence this AM interrogation is >3000, all tachy detection and therapies, are turned off In d/w device rep RV lead has no RV lead capture by interrogation last PM (not available for review) and noise noted on RV lead.  Past Medical History:  Diagnosis Date  . Abnormal thyroid scan    Abnormal thyroid imaging studies from 11/09/2010, status post ultrasound guided fine needle aspiration of the dominant left inferior thyroid nodule on 12/15/2010. Cytology report showed rare follicular epithelial cells and hemosiderin laden macrophages.  . Arthritis    "all over"  . Asthma   . Atrial fibrillation ([place])    on chronic Coumadin; stopped July 2013 due to  subdural hematomas  . CHF (congestive heart failure) (HCC)    EF 35-40% s/p most recent ICD generator change-out with Medtronic dual-chamber ICD 05/20/11 with explantation of previous abdominally-implanted device  . Coronary artery disease    s/p CABG 1983 and PCI/stent 2004.   . Diabetes mellitus    diet controlled  . Dyslipidemia   . Erythrocytosis   . GERD (gastroesophageal reflux disease)   . Hypertension   . Ischemic cardiomyopathy    WITH CHF  . Monocytosis 04/17/2013  . Myocardial infarction ([place]) 1983; ~ 1990  . 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 Pneumonia August 2013  . Subdural hematoma [facility]) July 2013   Anticoagulation stopped.   . VT (ventricular tachycardia) ([place])   . Wears glasses     Past Surgical History:  Procedure Laterality Date  . CHOLECYSTECTOMY    . COLONOSCOPY  07/07/2011   Procedure: COLONOSCOPY;  Surgeon: Jerene Bears, MD;  Location: [facility];  Service: Gastroenterology;  Laterality: N/A;  . CORONARY ANGIOPLASTY WITH STENT PLACEMENT  2004   Tandem Cypher stents LAD  . CORONARY ARTERY BYPASS GRAFT  1983   SVG-mLAD  . ESOPHAGOGASTRODUODENOSCOPY  02/11/2011   Procedure: ESOPHAGOGASTRODUODENOSCOPY (EGD);  Surgeon: Beryle Beams, MD;  Location: [facility];  Service: Endoscopy;  Laterality: N/A;  . ICD GENERATOR CHANGEOUT N/A 04/08/2016   Procedure: ICD Generator Changeout;  Surgeon: Evans Lance, MD;  Location: [place];  Service: Cardiovascular;  Laterality: N/A;  . IMPLANTABLE CARDIOVERTER DEFIBRILLATOR (ICD) GENERATOR CHANGE N/A 05/20/2011   Procedure: ICD GENERATOR CHANGE;  Surgeon:  Evans Lance, MD;  Medtronic secure dual-chamber ICD serial number [id number]   . KNEE ARTHROSCOPY     right; "just went in and scraped it"  . MASS EXCISION Right 05/10/2013   Procedure: EXCISION MASS RIGHT THUMB;  Surgeon: Wynonia Sours, MD;  Location: [place];  Service: Orthopedics;  Laterality: Right;  . PROXIMAL INTERPHALANGEAL FUSION (PIP) Right  05/10/2013   Procedure: DEBRIDEMENT PROXIMAL INTERPHALANGEAL FUSION (PIP);  Surgeon: Wynonia Sours, MD;  Location: [place];  Service: Orthopedics;  Laterality: Right;  . TONSILLECTOMY             Inpatient Medications: Scheduled Meds: . enoxaparin (LOVENOX) injection  40 mg Subcutaneous Q24H   Continuous Infusions:  PRN Meds:   Allergies:    Allergies  Allergen Reactions  . Celebrex [Celecoxib] Hives and Other (See Comments)    Gi upset  . Esomeprazole Magnesium Hives and Other (See Comments)    Other reaction(s): Hives "don't really remember"  . Tamsulosin Other (See Comments)    Dizziness, Made BP very low and weakness  . Dronedarone Other (See Comments)    Other reaction(s): GI upset, abdominal pain  . Digoxin Other (See Comments)    Unknown   . Hydrocodone-Acetaminophen Other (See Comments)    Bad headache  . Protonix [Pantoprazole Sodium] Nausea And Vomiting    Tolerates Dexilant    Social History:   Social History   Social History  . Marital status: Divorced    Spouse name: N/A  . Number of children: 2  . Years of education: N/A   Occupational History  . real estate Retired   Social History Main Topics  . Smoking status: Former Smoker    Packs/day: 2.00    Years: 30.00    Types: Cigarettes    Quit date: 06/23/1976  . Smokeless tobacco: Never Used  . Alcohol use No  . Drug use: No  . Sexual activity: No   Other Topics Concern  . Not on file   Social History Narrative  . No narrative on file    Family History:    Family History  Problem Relation Age of Onset  . Tuberculosis Mother   . Tuberculosis Father   . Heart disease Brother   . Diabetes Sister   . Diabetes Brother   . Clotting disorder Brother      ROS:  Please see the history of present illness.  ROS  All other ROS reviewed and negative.     Physical Exam/Data:   Vitals:   11/10/16 2115 11/10/16 2130 11/10/16 2148 11/11/16 0400  BP: 111/61 (!) 116/56  118/74 120/73  Pulse: (!) 56 66 97 75  Resp: 17 14 16    Temp:   (!) 97.5 F (36.4 C) (!) 97.5 F (36.4 C)  TempSrc:   Oral Oral  SpO2: 96% 97% 97% 98%  Weight:   196 lb 9.6 oz (89.2 kg) 192 lb 14.4 oz (87.5 kg)  Height:        Intake/Output Summary (Last 24 hours) at 11/11/16 1005 Last data filed at 11/10/16 2312  Gross per 24 hour  Intake              480 ml  Output                0 ml  Net              480 ml   Filed Weights   11/10/16 1907 11/10/16 2148  11/11/16 0400  Weight: 180 lb (81.6 kg) 196 lb 9.6 oz (89.2 kg) 192 lb 14.4 oz (87.5 kg)   Body mass index is 26.16 kg/m.  General:  Well nourished, well developed, in no acute distress HEENT: normal Lymph: no adenopathy Neck: no JVD Endocrine:  No thryomegaly Vascular: No carotid bruits Cardiac:  RRR; no significant murmurs, no gallops or rubs are appreciated Lungs:  CTA b/l, no wheezing, rhonchi or rales  Abd: soft, nontender  Ext: no edema Musculoskeletal:  No deformities, BUE and BLE strength normal and equal, age appropriate atrophy Skin: warm and dry  Neuro:  CNs 2-12 intact, no focal abnormalities noted Psych:  Normal affect   EKG:  The EKG was personally reviewed and demonstrates:   AFib 74bpm Telemetry:  Telemetry was personally reviewed and demonstrates:   AFib, CVR  Relevant CV Studies:  04/08/16: ICD generator change Conclusion: Successful removal of a previous implanted dual-chamber ICD which had reached elective replacement, and insertion of a new single chamber ICD with capping of the old atrial lead and relocation of the pocket to a subpectoral location in a patient with an ischemic cardiomyopathy and sustained monomorphic ventricular tachycardia.  09/10/12: TTE, unable to assess LV function  08/13/11: TTE Study Conclusions - Left ventricle: Technically severely limited study.There is motion of the apical septal and apical lateral walls. The anterior septum and inferior septum seem to  move.The inferior wall and inferolateral walls can not be assessed. There is motion of the mid-lateral segment, but hypokinesis of the basal lateral segment. Anterior wall cannot be assessed. The EF can not be estimated. The cavity size was mildly to moderately dilated. - Aortic valve: Poorly visualized. The valve appears to be grossly normal. - Mitral valve: Poorly visualized. The valve appears to be grossly normal. - Right ventricle: Pacer wire or catheter noted in right ventricle.  05/14/09: TTE, LVEF 35-30%    Laboratory Data:  Chemistry Recent Labs Lab 11/10/16 1921  NA 135  K 4.2  CL 100*  CO2 25  GLUCOSE 140*  BUN 21*  CREATININE 1.47*  CALCIUM 8.8*  GFRNONAA 43*  GFRAA 50*  ANIONGAP 10     Recent Labs Lab 11/10/16 1921  PROT 6.8  ALBUMIN 3.8  AST 19  ALT 9*  ALKPHOS 106  BILITOT 1.0   Hematology Recent Labs Lab 11/10/16 1921  WBC 7.2  RBC 4.58  HGB 12.1*  HCT 41.8  MCV 91.3  MCH 26.4  MCHC 28.9*  RDW 14.8  PLT 142*   Cardiac EnzymesNo results for input(s): TROPONINI in the last 168 hours.  Recent Labs Lab 11/10/16 2005  TROPIPOC 0.00    BNPNo results for input(s): BNP, PROBNP in the last 168 hours.  DDimer No results for input(s): DDIMER in the last 168 hours.  Radiology/Studies:  Dg Chest 2 View Result Date: 11/10/2016 CLINICAL DATA:  Possible pacer malfunction. EXAM: CHEST  2 VIEW COMPARISON:  04/14/2016 FINDINGS: Grossly intact transvenous cardiac leads. Epicardial leads also appear unremarkable and unchanged. Moderate cardiomegaly, unchanged. The lungs are clear. Pulmonary vasculature is normal. There is no pleural effusion. Stable moderate hyperinflation. IMPRESSION: Hyperinflation and cardiomegaly, unchanged.  No acute findings. Electronically Signed   By: Andreas Newport M.D.   On: 11/10/2016 20:38    Assessment and Plan:   1. ICD RV lead failure     Lead fracture (rather then set screw) given this AM  impedance >3000 as well     He has hx of VT, this is  a fairly new device from march of this year, no VT to date by this device     Dr. Tanna Furry notes mention symptomatic tachy-brady syndrome, in review he V paces very rarely, 2-4%     In review of records, there is a note that mentions he has hx of appropriate tx and being on amiodarone that was stopped 2/2 tremors (I am not sure when he was shocked, the patient seems to recall being shocked "a few years back")       Today's schedule likely not able to accommodate lead revision/replacement Discussed with the patient potential options regarding his device.  He is uncomfortable going without the device and would prefer staying here and getting new lead in.  I have put him on the schedule for Monday with Dr. Caryl Comes.  2. Permanent Afib     Hx of SDH, not felt to be an a/c candidate    3. CAD      No anginal complaints   3. HTN     BP stable, no changes    For questions or updates, please contact [place] Please consult [email or web address] for contact info under Cardiology/STEMI.   Signed, Baldwin Jamaica, PA-C  11/11/2016 10:05 AM;  I have seen and examined this patient with Tommye Standard.  Agree with above, note added to reflect my findings.  On exam, RRR, no murmurs, lungs clear. Presented to the hospital with ICD lead fracture. Patient would like to wait in the hospital to have it repaired. Will plan for Monday.    Will M. Camnitz MD 11/11/2016 3:33 PM

## 2016-11-11 NOTE — Care Management Obs Status (Signed)
[place] NOTIFICATION   Patient Details  Name: [name] MRN: [id number] Date of Birth: 08-25-1932   Medicare Observation Status Notification Given:  Yes    Carles Collet, RN 11/11/2016, 9:02 AM

## 2016-11-11 NOTE — Consult Note (Signed)
[facility] CM Primary Care Navigator  11/11/2016  Issaac Shipper [facility] [birth year] [id number]   Met with patient at the bedside to identify possible discharge needs.  Patient reports that he was called by provider's office stating that his defibrillator was showing a possible lead fracture and he was advised to go to the emergency roomthat had resulted to this admission.  Patient endorses Dr. Alysia Penna with [place] [facility] provider.   [facility] [address]. Patient reports managing his ownmedications at home with use of "pill box" system filled weekly.   According to patient, hewasdriving prior to admission, but he can use taxi cabas transportation to his doctors' appointments after discharge when needed.  Patient came from home, lives alone and independent. He mentioned that son lives in [place] and daughter is not in contact with him. He is theprimary caregiver for himself.   Anticipated discharge plan is home according to patient.  Patientexpressed understanding to callprimary care provider's officewhen he returnshome for a post discharge follow-up appointment within a week or sooner if needed. Patient letter (with PCP's contact number) was provided as his reminder.  Explained to patient about [facility] CM services available for health management at home but he denies any current needs or concerns at this time. He states "managing self well at home so far".  Patientvoiced understanding to seek referral from primary care provider to [facility] care management if necessary and appropriate for services in the future.  [facility] care management information provided for future needs that he may have.  He had verbally agreed and opted for EMMI calls to follow-up with his recovery at home.  Referral made to [place] calls after discharge.   Primary care provider's  office is listed as doing transition of care (TOC).   For questions, please contact:  Dannielle Huh, BSN, RN- [facility] Primary Care Navigator  Telephone: [phone] [place]

## 2016-11-12 DIAGNOSIS — I482 Chronic atrial fibrillation: Secondary | ICD-10-CM

## 2016-11-12 DIAGNOSIS — I472 Ventricular tachycardia: Secondary | ICD-10-CM

## 2016-11-12 MED ORDER — ONDANSETRON HCL 4 MG PO TABS
4.0000 mg | ORAL_TABLET | Freq: Three times a day (TID) | ORAL | Status: DC | PRN
  Administered 2016-11-12 (×2): 4 mg via ORAL
  Filled 2016-11-12 (×2): qty 1

## 2016-11-12 NOTE — Progress Notes (Signed)
Electrophysiology Rounding Note  Patient Name: [name] Date of Encounter: 11/12/2016  Electrophysiologist: Dr Lovena Le   Subjective   The patient is doing well today.  At this time, the patient denies chest pain, shortness of breath, or any new concerns.  Inpatient Medications    Scheduled Meds: . carvedilol  12.5 mg Oral BID WC  . cycloSPORINE  1 drop Both Eyes BID  . enoxaparin (LOVENOX) injection  40 mg Subcutaneous Q24H  . famotidine  40 mg Oral Daily  . linaclotide  72 mcg Oral QAC breakfast  . lisinopril  10 mg Oral Daily  . loratadine  10 mg Oral Daily  . mometasone-formoterol  2 puff Inhalation BID  . polyethylene glycol  17 g Oral Daily  . potassium chloride  10 mEq Oral Daily   Continuous Infusions:  PRN Meds: albuterol, calcium carbonate, gabapentin, nitroGLYCERIN, ondansetron, phenol   Vital Signs    Vitals:   11/12/16 0424 11/12/16 0730 11/12/16 0807 11/12/16 0855  BP: (!) 133/59 (!) 134/59 (!) 142/86   Pulse: (!) 54     Resp: 15 16    Temp: 97.8 F (36.6 C) 98 F (36.7 C)    TempSrc: Oral     SpO2: 97% 98%  96%  Weight: 189 lb 9.6 oz (86 kg)     Height:        Intake/Output Summary (Last 24 hours) at 11/12/16 0925 Last data filed at 11/12/16 0800  Gross per 24 hour  Intake             1200 ml  Output                0 ml  Net             1200 ml   Filed Weights   11/10/16 2148 11/11/16 0400 11/12/16 0424  Weight: 196 lb 9.6 oz (89.2 kg) 192 lb 14.4 oz (87.5 kg) 189 lb 9.6 oz (86 kg)    Physical Exam    GEN- The patient is well appearing, alert and oriented x 3 today.   Head- normocephalic, atraumatic Eyes-  Sclera clear, conjunctiva pink Ears- hearing intact Oropharynx- clear Neck- supple Lungs- Clear to ausculation bilaterally, normal work of breathing Heart- Regular rate and rhythm, no murmurs, rubs or gallops GI- soft, NT, ND, + BS Extremities- no clubbing, cyanosis, or edema Skin- no rash or lesion Psych- euthymic  mood, full affect Neuro- strength and sensation are intact  Labs    CBC  Recent Labs  11/10/16 1921  WBC 7.2  NEUTROABS 3.0  HGB 12.1*  HCT 41.8  MCV 91.3  PLT 628*   Basic Metabolic Panel  Recent Labs  11/10/16 1921  NA 135  K 4.2  CL 100*  CO2 25  GLUCOSE 140*  BUN 21*  CREATININE 1.47*  CALCIUM 8.8*   Liver Function Tests  Recent Labs  11/10/16 1921  AST 19  ALT 9*  ALKPHOS 106  BILITOT 1.0  PROT 6.8  ALBUMIN 3.8     Telemetry    afib (personally reviewed)  Radiology    Dg Chest 2 View  Result Date: 11/10/2016 CLINICAL DATA:  Possible pacer malfunction. EXAM: CHEST  2 VIEW COMPARISON:  04/14/2016 FINDINGS: Grossly intact transvenous cardiac leads. Epicardial leads also appear unremarkable and unchanged. Moderate cardiomegaly, unchanged. The lungs are clear. Pulmonary vasculature is normal. There is no pleural effusion. Stable moderate hyperinflation. IMPRESSION: Hyperinflation and cardiomegaly, unchanged.  No acute findings. Electronically Signed  By: Andreas Newport M.D.   On: 11/10/2016 20:38     Assessment & Plan    1. History of ventricular tachycardia Admitted with ICD lead fracture I discussed options of discharge with return to see Dr Lovena Le vs waiting in the hospital until Monday for lead revision.  He is clear that he would prefer to stay in the hospital over the weekend.  2. Permanent afib Not on anticoagulation V rates occasionally elevated  3. CAD Stable No change required today  4. HTN Stable No change required today  On schedule for lead revision with Dr Caryl Comes on Monday  Signed, Thompson Grayer, MD  11/12/2016, 9:25 AM

## 2016-11-12 NOTE — Plan of Care (Signed)
Problem: Safety: Goal: Ability to remain free from injury will improve Outcome: Progressing Fall risk bundle in place. No falls, skin breakdown or other injuries this shift.

## 2016-11-13 DIAGNOSIS — Z8679 Personal history of other diseases of the circulatory system: Secondary | ICD-10-CM

## 2016-11-13 DIAGNOSIS — I10 Essential (primary) hypertension: Secondary | ICD-10-CM

## 2016-11-13 MED ORDER — SODIUM CHLORIDE 0.9 % IR SOLN: 80.0000 mg | Status: DC

## 2016-11-13 MED ORDER — CHLORHEXIDINE GLUCONATE 4 % EX LIQD: 60.0000 mL | Freq: Once | CUTANEOUS | Status: DC

## 2016-11-13 MED ORDER — SODIUM CHLORIDE 0.9 % IR SOLN
80.0000 mg | Status: DC
  Filled 2016-11-13: qty 2

## 2016-11-13 MED ORDER — SODIUM CHLORIDE 0.9 % IV SOLN: INTRAVENOUS | Status: DC

## 2016-11-13 MED ORDER — CEFAZOLIN SODIUM-DEXTROSE 2-4 GM/100ML-% IV SOLN: 2.0000 g | INTRAVENOUS | Status: DC

## 2016-11-13 MED ORDER — SODIUM CHLORIDE 0.9% FLUSH: 3.0000 mL | INTRAVENOUS | Status: DC | PRN

## 2016-11-13 MED ORDER — CEFAZOLIN SODIUM-DEXTROSE 2-4 GM/100ML-% IV SOLN
2.0000 g | INTRAVENOUS | Status: DC
  Filled 2016-11-13: qty 100

## 2016-11-13 MED ORDER — SODIUM CHLORIDE 0.9 % IV SOLN
INTRAVENOUS | Status: DC
  Administered 2016-11-14: 06:00:00 via INTRAVENOUS

## 2016-11-13 MED ORDER — CHLORHEXIDINE GLUCONATE 4 % EX LIQD
60.0000 mL | Freq: Once | CUTANEOUS | Status: AC
  Administered 2016-11-14: 4 via TOPICAL
  Filled 2016-11-13: qty 15

## 2016-11-13 MED ORDER — SODIUM CHLORIDE 0.45 % IV SOLN: INTRAVENOUS | Status: DC

## 2016-11-13 MED ORDER — SODIUM CHLORIDE 0.9% FLUSH
3.0000 mL | Freq: Two times a day (BID) | INTRAVENOUS | Status: DC
  Administered 2016-11-13: 3 mL via INTRAVENOUS

## 2016-11-13 MED ORDER — CHLORHEXIDINE GLUCONATE 4 % EX LIQD
60.0000 mL | Freq: Once | CUTANEOUS | Status: AC
  Administered 2016-11-13: 4 via TOPICAL
  Filled 2016-11-13: qty 15

## 2016-11-13 MED ORDER — SODIUM CHLORIDE 0.9 % IV SOLN: 250.0000 mL | INTRAVENOUS | Status: DC

## 2016-11-13 NOTE — Progress Notes (Signed)
Electrophysiology Rounding Note  Patient Name: [name] Date of Encounter: 11/13/2016   Primary EP: Lovena Le  Subjective   The patient is doing well today.  At this time, the patient denies chest pain, shortness of breath, or any new concerns.  Inpatient Medications    Scheduled Meds: . carvedilol  12.5 mg Oral BID WC  . cycloSPORINE  1 drop Both Eyes BID  . enoxaparin (LOVENOX) injection  40 mg Subcutaneous Q24H  . famotidine  40 mg Oral Daily  . linaclotide  72 mcg Oral QAC breakfast  . lisinopril  10 mg Oral Daily  . loratadine  10 mg Oral Daily  . mometasone-formoterol  2 puff Inhalation BID  . polyethylene glycol  17 g Oral Daily  . potassium chloride  10 mEq Oral Daily   Continuous Infusions:  PRN Meds: albuterol, calcium carbonate, gabapentin, nitroGLYCERIN, ondansetron, phenol   Vital Signs    Vitals:   11/12/16 1700 11/12/16 1931 11/13/16 0500 11/13/16 0805  BP: (!) 117/41 (!) 118/104 (!) 114/58   Pulse: 75 (!) 53 (!) 103   Resp: 16 15 18    Temp: 97.8 F (36.6 C) 97.8 F (36.6 C) (!) 97.5 F (36.4 C)   TempSrc: Oral Oral Oral   SpO2: 98% 95% 95% 96%  Weight:   188 lb 1.6 oz (85.3 kg)   Height:        Intake/Output Summary (Last 24 hours) at 11/13/16 0900 Last data filed at 11/12/16 1932  Gross per 24 hour  Intake              600 ml  Output                0 ml  Net              600 ml   Filed Weights   11/11/16 0400 11/12/16 0424 11/13/16 0500  Weight: 192 lb 14.4 oz (87.5 kg) 189 lb 9.6 oz (86 kg) 188 lb 1.6 oz (85.3 kg)    Physical Exam    GEN- The patient is well appearing, alert and oriented x 3 today.   Head- normocephalic, atraumatic Eyes-  Sclera clear, conjunctiva pink Ears- hearing intact Oropharynx- clear Neck- supple Lungs- Clear to ausculation bilaterally, normal work of breathing Heart- irregular rate and rhythm, no murmurs, rubs or gallops GI- soft, NT, ND, + BS Extremities- no clubbing, cyanosis, or edema Skin-  no rash or lesion Psych- euthymic mood, full affect Neuro- strength and sensation are intact  Labs    CBC  Recent Labs  11/10/16 1921  WBC 7.2  NEUTROABS 3.0  HGB 12.1*  HCT 41.8  MCV 91.3  PLT 253*   Basic Metabolic Panel  Recent Labs  11/10/16 1921  NA 135  K 4.2  CL 100*  CO2 25  GLUCOSE 140*  BUN 21*  CREATININE 1.47*  CALCIUM 8.8*   Liver Function Tests  Recent Labs  11/10/16 1921  AST 19  ALT 9*  ALKPHOS 106  BILITOT 1.0  PROT 6.8  ALBUMIN 3.8     Telemetry    Rate controlled afib (personally reviewed)    Assessment & Plan    1. Prior VT, now with malfunctioning ICD lead He has been scheduled for ICD system revision tomorrow by Dr Caryl Comes.  Risks of procedure discussed with patient who wishes to proceed.  He does not wish to go home and return (an option offered to him by me yesterday). Ordered placed  2. Permanent afib Not on anticoagulation V rates are currently controlled  3. HTN Stable No change required today  4. CAD No ischemic symptoms  Signed, Thompson Grayer, MD  11/13/2016, 9:00 AM

## 2016-11-14 ENCOUNTER — Encounter (HOSPITAL_COMMUNITY)
Admission: EM | Disposition: A | Discharge status: Home / Self Care | Payer: Self-pay | Source: Home / Self Care | Attending: Internal Medicine

## 2016-11-14 DIAGNOSIS — Z7189 Other specified counseling: Secondary | ICD-10-CM

## 2016-11-14 LAB — BASIC METABOLIC PANEL
ANION GAP: 9 (ref 5–15)
BUN: 19 mg/dL (ref 6–20)
CALCIUM: 9.3 mg/dL (ref 8.9–10.3)
CO2: 24 mmol/L (ref 22–32)
Chloride: 105 mmol/L (ref 101–111)
Creatinine, Ser: 1.39 mg/dL — ABNORMAL HIGH (ref 0.61–1.24)
GFR, EST AFRICAN AMERICAN: 53 mL/min — AB (ref >60)
GFR, EST NON AFRICAN AMERICAN: 46 mL/min — AB (ref >60)
Glucose, Bld: 115 mg/dL — ABNORMAL HIGH (ref 65–99)
POTASSIUM: 4.2 mmol/L (ref 3.5–5.1)
Sodium: 138 mmol/L (ref 135–145)

## 2016-11-14 LAB — SURGICAL PCR SCREEN
MRSA, PCR: NEGATIVE
Staphylococcus aureus: POSITIVE — AB

## 2016-11-14 SURGERY — LEAD REVISION/REPAIR

## 2016-11-14 MED ORDER — SALINE SPRAY 0.65 % NA SOLN
1.0000 | NASAL | Status: DC | PRN
  Administered 2016-11-14: 1 via NASAL
  Filled 2016-11-14: qty 44

## 2016-11-14 NOTE — Care Management Note (Signed)
Case Management Note  Patient Details  Name: [name] MRN: [id number] Date of Birth: [birth year]  Subjective/Objective:                    Action/Plan: Pt discharging home with self care. Pt asked that [employer] cab be called for transportation (private pay). CM assisted him making call to [facility]. Pt has PCP and insurance.  No further needs per CM.  Expected Discharge Date:  11/14/16               Expected Discharge Plan:  Home/Self Care  In-House Referral:     Discharge planning Services  CM Consult  Post Acute Care Choice:    Choice offered to:     DME Arranged:    DME Agency:     HH Arranged:    HH Agency:     Status of Service:  Completed, signed off  If discussed at [employer] of Stay Meetings, dates discussed:    Additional Comments:  Pollie Friar, RN 11/14/2016, 12:02 PM
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
## 2016-11-14 NOTE — Plan of Care (Signed)
Problem: Safety: Goal: Ability to remain free from injury will improve Outcome: Progressing No falls, injuries or new signs of skin breakdown. Safety bundle in place.   Problem: Skin Integrity: Goal: Risk for impaired skin integrity will decrease Outcome: Progressing Pt bathed and linen changed. Heels floated bilaterally.

## 2016-11-14 NOTE — Discharge Instructions (Signed)
Ventricular Tachycardia Ventricular tachycardia is a fast heartbeat that begins in the lower chambers of the heart (ventricles). It is a type of abnormal heart rhythm (arrhythmia). A normal heartbeat usually starts when an area in the heart called the sinoatrial (SA) node releases an electrical signal. With ventricular tachycardia, electrical signals in the lower part of the heart fire abnormally and interfere with the electrical signals sent out by the SA node. A normal heart rate is 60-100 beats per minute. During an episode of ventricular tachycardia, the heart reaches 100 beats per minute or higher. This condition can be life-threatening and should be treated immediately. What are the causes? This condition is caused by abnormal electrical activity in the lower part of the heart. This may result from:  Medicines.f  Diseases of the heart muscle (cardiomyopathy).  The heart not getting enough oxygen. This may be caused by blood flow problems in the arteries.  An inflammatory disease that affects multiple areas of the body (sarcoidosis).  Drug use, such as cocaine, amphetamine, or anabolic steroid use.  What increases the risk? You are more likely to develop this condition if:  You have had a heart attack.  You have: ? Heart failure or cardiomyopathy. ? Heart defects that you were born with (congenital heart defects). ? Abnormal heart tissue. ? Heart valves that leak or are narrow. ? Diabetes. ? An infection that affects the heart. ? High blood pressure. ? An overactive or underactive thyroid. ? Sleep apnea. ? A family history of stopped heartbeat (cardiac arrest) or coronary artery disease. ? High cholesterol.  You smoke.  You drink alcohol heavily.  You use drugs, such as cocaine.  What are the signs or symptoms? Symptoms of this condition include:  A pounding heartbeat.  Feeling as if your heart is skipping beats or fluttering (palpitations).  Shortness of  breath.  Anxiety.  Dizziness.  Light-headedness.  Fainting.  Chest pain.  Cardiac arrest caused by an irregular heartbeat (arrhythmia).  How is this diagnosed? This condition may be diagnosed based on:  Your symptoms and medical history.  A physical exam.  Electrocardiogram (ECG). This test is done to check for problems with electrical activity in the heart.  Holter monitor or event monitor test. This test involves wearing a portable device that monitors your heart rate over time.  You may also have other tests, including:  Blood tests.  Chest X-ray.  Echocardiogram. This test involves using sound waves to create images of the heart.  Angiogram. During this test, dye is injected into your bloodstream, and then X-rays are taken. The dye lets your health care provider see how blood flows through your arteries.  Exercise stress test. During this test, an ECG is done while you exercise on a treadmill.  Cardiac CT scan or cardiac MRI.  How is this treated? Treatment for this condition depends on the cause. Treatment may include:  Medicines that slow the heart rate and return it to a normal rhythm (anti-arrhythmics).  An electric shock (cardioversion) that makes the heart go back to a normal rhythm.  An electrophysiology study. This procedure can help locate areas of heart tissue that are causing rapid heartbeats. ? In this procedure, a thin, flexible tube (catheter) is inserted into one of your veins and moved to your heart to evaluate your heart's electrical activity. ? In some cases, the heart tissue that is causing problems may be killed with radiofrequency energy delivered through the catheter (radiofrequency ablation). This may help your heart keep  a normal rhythm.  An implantable cardioverter defibrillator (ICD). This is a small device that monitors your heartbeat. When it senses an irregular heartbeat, it sends a shock to bring the heartbeat back to normal. The ICD  is implanted under the skin in the chest.  Surgery to improve blood flow to the heart.  Genetic counseling to check whether your family members are at risk for ventricular tachycardia.  Follow these instructions at home: Lifestyle  Do not use any products that contain nicotine or tobacco, such as cigarettes and e-cigarettes. If you need help quitting, ask your health care provider.  Do not use stimulant drugs, such as cocaine or methamphetamines.  Maintain a healthy weight.  Manage stress. Try to do this with relaxation exercises, yoga, quiet time, or meditation. Eating and drinking  Eat a healthy diet. This includes plenty of fruits and vegetables, whole grains, lean meats, and low-fat or fat-free dairy products.  Avoid eating foods that are high in saturated fat, trans fat, sugar, or salt (sodium).  Ask your health care provider if you may drink alcohol. ? If alcohol triggers episodes of ventricular tachycardia, do not drink alcohol. ? If alcohol does not trigger episodes, limit alcohol intake to no more than 1 drink a day for nonpregnant women and 2 drinks a day for men. One drink equals 12 oz of beer, 5 oz of wine, or 1 oz of hard liquor. General instructions  Take over-the-counter and prescription medicines only as told by your health care provider.  Exercise regularly. Aim for 150 minutes of moderate exercise or 75 minutes of vigorous exercise per week. Ask your health care provider what exercises are safe for you.  Keep all follow-up visits as told by your health care provider. This is important. Contact a health care provider if:  Your symptoms get worse.  You develop new symptoms, such as new palpitations.  You feel depressed. Get help right away if:  You have an episode of ventricular tachycardia that lasts 30 seconds or more.  You have chest pain.  You have trouble breathing. These symptoms may represent a serious problem that is an emergency. Do not wait to  see if the symptoms will go away. Get medical help right away. Call your local emergency services (911 in the [place] Do not drive yourself to the hospital. Summary  Ventricular tachycardia is a fast heartbeat that begins in the lower chambers of the heart. This condition can be life-threatening and should be treated immediately.  This condition may be treated with medicines, electric shock, radiofrequency energy, surgery, or insertion of an implantable cardioverter defibrillator (ICD).  Get help right away if you have chest pain, trouble breathing, or ventricular tachycardia symptoms that last more than 30 seconds. This information is not intended to replace advice given to you by your health care provider. Make sure you discuss any questions you have with your health care provider. Document Released: 03/10/2016 Document Revised: 03/10/2016 Document Reviewed: 03/10/2016 Elsevier Interactive Patient Education  2018 [employer].

## 2016-11-17 ENCOUNTER — Other Ambulatory Visit: Payer: Self-pay | Admitting: *Deleted

## 2016-11-17 ENCOUNTER — Other Ambulatory Visit: Payer: Self-pay | Admitting: Family Medicine

## 2016-11-17 NOTE — Telephone Encounter (Signed)
Can we refill this? 

## 2016-11-17 NOTE — Patient Outreach (Signed)
[facility]) Care Management  11/17/2016  [name] April 19, 1932 [id number]  EMMI-General Discharge -Red Alert referral-Day #1, 11/16/2016; Reason: Got discharge papers-no; know who to call for changes in condition-no; follow up scheduled-no; other questions/problems-yes:  Telephone call to patient; left HIPPA compliant voice mail requesting return call.  Plan: Will follow up.  Sherrin Daisy, RN BSN [place] Management Coordinator [facility] Care Management  [phone]

## 2016-11-17 NOTE — Patient Outreach (Signed)
Patient triggered RED on EMMI General Discharge Dashboard, notification sent to:  Sherrin Daisy, RN

## 2016-11-18 ENCOUNTER — Other Ambulatory Visit: Payer: Self-pay | Admitting: *Deleted

## 2016-11-18 NOTE — Patient Outreach (Signed)
[facility]) Care Management  11/18/2016  [name] 1932-09-22 [id number]  EMMI-General Discharge-Red Alert -Day#!, 11/16/2016;  Reason: Did not have discharge papers, did not know who to call for changes in condition, no follow up appointment scheduled, has questions.  Telephone call #2 to patient. Patient answered call & was advised of reason for call. Patient gave HIPPA verification.  Patient voices that he has found his discharge instructions since he received the automatic calls & has read it. States he understands instructions & has been able to schedule all of his    needed follow up appointments. States he has transportation to get to primary care & cardiologist appointments.  Voices that he has all of his medication & is taking as it as ordered by his doctors.  States he does have question about why he did get his internal defibrillator fixed while he was in hospital. Patient referred to his doctor to get answer to his question.   Patient is also aware of calling his MD if he had questions about abnormal symptoms or changes in condition . Patient knows to call 911 for emergency situations.  Patient was advised to take discharge instructions with list of medications & any medications that are not on list that he may have questions about to hospital follow up appointment. Patient voices understanding.   No further concerns at this time. Patient's primary care office is completing "TOC" & will refer to [facility] if needed.  EMMI call completed.  Plan: Send to care management assistant to close out case.    Sherrin Daisy, RN BSN [place] Management Coordinator [facility] Care Management  [phone]

## 2016-11-21 ENCOUNTER — Encounter: Payer: Self-pay | Admitting: Family Medicine

## 2016-11-21 ENCOUNTER — Ambulatory Visit (INDEPENDENT_AMBULATORY_CARE_PROVIDER_SITE_OTHER): Payer: Medicare Other | Admitting: Family Medicine

## 2016-11-21 VITALS — BP 122/82 | HR 99 | Temp 97.7°F | Wt 192.0 lb

## 2016-11-21 DIAGNOSIS — Z95810 Presence of automatic (implantable) cardiac defibrillator: Secondary | ICD-10-CM

## 2016-11-21 DIAGNOSIS — I5022 Chronic systolic (congestive) heart failure: Secondary | ICD-10-CM

## 2016-11-21 DIAGNOSIS — I482 Chronic atrial fibrillation, unspecified: Secondary | ICD-10-CM

## 2016-11-21 DIAGNOSIS — I2510 Atherosclerotic heart disease of native coronary artery without angina pectoris: Secondary | ICD-10-CM

## 2016-11-21 NOTE — Progress Notes (Signed)
   Subjective:    Patient ID: [name], male    DOB: 29-Aug-1932, 83 y.o.   MRN: [id number]  HPI Here to follow up a hospital stay from 11-10-16 to 11-14-16 for a broken ICD lead. He was contacted by the telemetry monitoring team that the ICD was not functioning correctly and he was told to go to the ER. He had no symptoms at the time. This was confirmed and he was admitted. He was told during the stay that the ICD would need to be replaced and in fact he was prepped for surgery. However apparently this decision was reversed and he was sent home instead. He has felt fine at home with no chest pain or palpitations or SOB. He was never told in the hospital why the decision ws made to cancel the procedure, and he is quite upset about this. He demands answers as to what happened, but I cannot supply these answers because there nothing documented in the chart as to what the decision making process involved. He is scheduled to see Dr. Crissie Sickles on 11-30-16.    Review of Systems  Constitutional: Negative.   Respiratory: Negative.   Cardiovascular: Negative.   Gastrointestinal: Negative.   Neurological: Negative.        Objective:   Physical Exam  Constitutional: He is oriented to person, place, and time. He appears well-developed and well-nourished.  Walks with a cane   Neck: No thyromegaly present.  Cardiovascular: Normal rate, normal heart sounds and intact distal pulses.   Irregularly irregular rhythm   Pulmonary/Chest: Effort normal and breath sounds normal. No respiratory distress. He has no wheezes. He has no rales.  Lymphadenopathy:    He has no cervical adenopathy.  Neurological: He is alert and oriented to person, place, and time.          Assessment & Plan:  He seems to be stable today in atrial fibrillation with adequate ventricular rate control. HTN is well controlled. He will follow up with Dr. Lovena Le as above, but I will try to find out why the procedure was  cancelled so I can explain this to the patient.  Alysia Penna, MD

## 2016-11-22 ENCOUNTER — Other Ambulatory Visit: Payer: Self-pay | Admitting: Cardiology

## 2016-11-22 DIAGNOSIS — M1711 Unilateral primary osteoarthritis, right knee: Secondary | ICD-10-CM | POA: Diagnosis not present

## 2016-11-22 DIAGNOSIS — M170 Bilateral primary osteoarthritis of knee: Secondary | ICD-10-CM | POA: Diagnosis not present

## 2016-11-22 DIAGNOSIS — M25561 Pain in right knee: Secondary | ICD-10-CM | POA: Diagnosis not present

## 2016-11-22 DIAGNOSIS — M25562 Pain in left knee: Secondary | ICD-10-CM | POA: Diagnosis not present

## 2016-11-22 NOTE — Telephone Encounter (Signed)
REFILL 

## 2016-11-29 DIAGNOSIS — M1711 Unilateral primary osteoarthritis, right knee: Secondary | ICD-10-CM | POA: Diagnosis not present

## 2016-11-29 DIAGNOSIS — M25561 Pain in right knee: Secondary | ICD-10-CM | POA: Diagnosis not present

## 2016-11-30 ENCOUNTER — Encounter: Payer: Self-pay | Admitting: Internal Medicine

## 2016-11-30 ENCOUNTER — Ambulatory Visit (INDEPENDENT_AMBULATORY_CARE_PROVIDER_SITE_OTHER): Payer: Medicare Other | Admitting: Internal Medicine

## 2016-11-30 VITALS — BP 140/80 | HR 80 | Ht 72.0 in | Wt 196.0 lb

## 2016-11-30 DIAGNOSIS — I2510 Atherosclerotic heart disease of native coronary artery without angina pectoris: Secondary | ICD-10-CM

## 2016-11-30 DIAGNOSIS — T82110A Breakdown (mechanical) of cardiac electrode, initial encounter: Secondary | ICD-10-CM | POA: Diagnosis not present

## 2016-11-30 NOTE — Patient Instructions (Signed)
Medication Instructions:  Your physician recommends that you continue on your current medications as directed. Please refer to the Current Medication list given to you today.   Labwork: You will need lab work 14 days prior to your procedure.  We will set up this appointment after we determine what day your procedure will be on.  Testing/Procedures: Removal of broken ICD lead with replacement of new ICD lead.  Follow-Up:  I will begin working on setting up a surgical date.  Please call me Myrtie Hawk RN [phone].  Call me Monday November 5 morning or afternoon.  Any Other Special Instructions Will Be Listed Below (If Applicable).     If you need a refill on your cardiac medications before your next appointment, please call your pharmacy.

## 2016-11-30 NOTE — Progress Notes (Signed)
HPI [name] returns today for followup of his broken ICD lead. He is a pleasant 83 yo man with chronic systolic heart failure, VT remotely, s/p ICD insertion. He developed a broken ICD lead and was admitted. The decision was made initially to place a new lead but then it was decided to have him come back to see me to discuss ICD lead extraction and insertion of a new ICD lead. He has not had an ICD shock. He denies chest pain or sob but notes that his device has been alerting him several times a day.  Allergies  Allergen Reactions  . Celebrex [Celecoxib] Hives and Other (See Comments)    Gi upset  . Esomeprazole Magnesium Hives and Other (See Comments)    Other reaction(s): Hives "don't really remember"  . Tamsulosin Other (See Comments)    Dizziness, Made BP very low and weakness  . Dronedarone Other (See Comments)    Other reaction(s): GI upset, abdominal pain  . Digoxin Other (See Comments)    Unknown   . Hydrocodone-Acetaminophen Other (See Comments)    Bad headache  . Protonix [Pantoprazole Sodium] Nausea And Vomiting    Tolerates Dexilant     Current Outpatient Prescriptions  Medication Sig Dispense Refill  . ADVAIR DISKUS 250-50 MCG/DOSE AEPB Inhale 1 puff into the lungs 2 (two) times daily.   4  . calcium carbonate (TUMS - DOSED IN MG ELEMENTAL CALCIUM) 500 MG chewable tablet Chew 3 tablets by mouth daily as needed for heartburn.     . carvedilol (COREG) 12.5 MG tablet TAKE 1 TABLET (12.5 MG TOTAL) BY MOUTH 2 (TWO) TIMES DAILY. 180 tablet 3  . DEXILANT 60 MG capsule Take 1 capsule by mouth daily.  3  . diclofenac sodium (VOLTAREN) 1 % GEL Apply 2 g topically 4 (four) times daily. As needed 100 g 11  . fluticasone (FLONASE) 50 MCG/ACT nasal spray Place 2 sprays into the nose daily as needed for allergies.     . furosemide (LASIX) 40 MG tablet TAKE 1 TABLET (40 MG TOTAL) BY MOUTH DAILY. MAY TAKE AN EXTRA TAB DAILY AS NEEDED FOR SWELLING 100 tablet 2  . gabapentin  (NEURONTIN) 100 MG capsule TAKE 1 CAPSULE (100MG ) TO 3 CAPSULES (300MG ) EACH NIGHT AS NEEDED FOR MUSCLE PAINS 90 capsule 5  . LINZESS 72 MCG capsule Take 72 mcg by mouth daily.    Marland Kitchen lisinopril (PRINIVIL,ZESTRIL) 10 MG tablet Take 10 mg by mouth daily.    Marland Kitchen loratadine (CLARITIN) 10 MG tablet Take 10 mg by mouth daily.     . nitroGLYCERIN (NITROSTAT) 0.4 MG SL tablet PLACE ONE UNDER TONGUE FOR CHEST PAIN. 25 tablet 11  . polyethylene glycol (MIRALAX / GLYCOLAX) packet Take 17 g by mouth daily. 14 each 0  . potassium chloride (K-DUR,KLOR-CON) 10 MEQ tablet Take 1 tablet (10 mEq total) by mouth daily. 30 tablet 6  . ranitidine (ZANTAC) 300 MG tablet Take 1 tablet (300 mg total) by mouth daily. 90 tablet 3  . RESTASIS 0.05 % ophthalmic emulsion INSTILL 1 DROP INTO BOTH EYES TWICE A DAY 180 mL 3  . traMADol (ULTRAM) 50 MG tablet TAKE 2 TABLETS BY MOUTH EVERY 8 HOURS AS NEEDED 540 tablet 1  . VENTOLIN HFA 108 (90 Base) MCG/ACT inhaler Inhale 2 puffs into the lungs 4 (four) times daily as needed.  9   No current facility-administered medications for this visit.      Past Medical History:  Diagnosis  Date  . Abnormal thyroid scan    Abnormal thyroid imaging studies from 11/09/2010, status post ultrasound guided fine needle aspiration of the dominant left inferior thyroid nodule on 12/15/2010. Cytology report showed rare follicular epithelial cells and hemosiderin laden macrophages.  . Arthritis    "all over"  . Asthma   . Atrial fibrillation ([place])    on chronic Coumadin; stopped July 2013 due to subdural hematomas  . CHF (congestive heart failure) (HCC)    EF 35-40% s/p most recent ICD generator change-out with Medtronic dual-chamber ICD 05/20/11 with explantation of previous abdominally-implanted device  . Coronary artery disease    s/p CABG 1983 and PCI/stent 2004.   . Diabetes mellitus    diet controlled  . Dyslipidemia   . Erythrocytosis   . GERD (gastroesophageal reflux disease)   .  Hypertension   . Ischemic cardiomyopathy    WITH CHF  . Monocytosis 04/17/2013  . Myocardial infarction ([place]) 1983; ~ 1990  . 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 Pneumonia August 2013  . Subdural hematoma [facility]) July 2013   Anticoagulation stopped.   . VT (ventricular tachycardia) ([place])   . Wears glasses     ROS:   All systems reviewed and negative except as noted in the HPI.   Past Surgical History:  Procedure Laterality Date  . CHOLECYSTECTOMY    . COLONOSCOPY  07/07/2011   Procedure: COLONOSCOPY;  Surgeon: Jerene Bears, MD;  Location: [facility];  Service: Gastroenterology;  Laterality: N/A;  . CORONARY ANGIOPLASTY WITH STENT PLACEMENT  2004   Tandem Cypher stents LAD  . CORONARY ARTERY BYPASS GRAFT  1983   SVG-mLAD  . ESOPHAGOGASTRODUODENOSCOPY  02/11/2011   Procedure: ESOPHAGOGASTRODUODENOSCOPY (EGD);  Surgeon: Beryle Beams, MD;  Location: [facility];  Service: Endoscopy;  Laterality: N/A;  . ICD GENERATOR CHANGEOUT N/A 04/08/2016   Procedure: ICD Generator Changeout;  Surgeon: Evans Lance, MD;  Location: [facility];  Service: Cardiovascular;  Laterality: N/A;  . IMPLANTABLE CARDIOVERTER DEFIBRILLATOR (ICD) GENERATOR CHANGE N/A 05/20/2011   Procedure: ICD GENERATOR CHANGE;  Surgeon: Evans Lance, MD;  Medtronic secure dual-chamber ICD serial number [id number]   . KNEE ARTHROSCOPY     right; "just went in and scraped it"  . MASS EXCISION Right 05/10/2013   Procedure: EXCISION MASS RIGHT THUMB;  Surgeon: Wynonia Sours, MD;  Location: [facility];  Service: Orthopedics;  Laterality: Right;  . PROXIMAL INTERPHALANGEAL FUSION (PIP) Right 05/10/2013   Procedure: DEBRIDEMENT PROXIMAL INTERPHALANGEAL FUSION (PIP);  Surgeon: Wynonia Sours, MD;  Location: [facility];  Service: Orthopedics;  Laterality: Right;  . TONSILLECTOMY           Family History  Problem Relation Age of Onset  . Tuberculosis Mother   . Tuberculosis Father   . Heart disease Brother    . Diabetes Sister   . Diabetes Brother   . Clotting disorder Brother      Social History   Social History  . Marital status: Divorced    Spouse name: N/A  . Number of children: 2  . Years of education: N/A   Occupational History  . real estate Retired   Social History Main Topics  . Smoking status: Former Smoker    Packs/day: 2.00    Years: 30.00    Types: Cigarettes    Quit date: 06/23/1976  . Smokeless tobacco: Never Used  . Alcohol use No  . Drug use: No  . Sexual activity:  No   Other Topics Concern  . Not on file   Social History Narrative  . No narrative on file     BP 140/80   Pulse 80   Ht 6' (1.829 m)   Wt 196 lb (88.9 kg)   BMI 26.58 kg/m   Physical Exam:  Well appearing 83 NAD HEENT: Unremarkable Neck:  No JVD, no thyromegally Lymphatics:  No adenopathy Back:  No CVA tenderness Lungs:  Clear with no wheezes HEART:  Regular rate rhythm, no murmurs, no rubs, no clicks Abd:  soft, positive bowel sounds, no organomegally, no rebound, no guarding Ext:  2 plus pulses, no edema, no cyanosis, no clubbing Skin:  No rashes no nodules Neuro:  CN II through XII intact, motor grossly intact    DEVICE  Normal device function.  See PaceArt for details. Broken ICD lead with noise resulting in oversensing  Assess/Plan: 1. ICD lead dysfunction - I have discussed the treatment options with the patient. He would like to undergo removal of his old and broken lead and insertion of a new ICD lead. I have carefully discussed the risks/benefits/goals/expectations of the procedure with the patient and he wishes to proceed.  2. Chronic systolic heart failure - His symptoms are class 2. He will continue his current meds. 3. HTN - his blood pressure is minimally elevated. He will maintain a low sodium diet. 4. VT - he has a h/o sustained VT but has not had any in over 10 years by his report.  Mikle Bosworth.D.

## 2016-11-30 NOTE — Telephone Encounter (Signed)
error 

## 2016-12-04 ENCOUNTER — Other Ambulatory Visit: Payer: Self-pay | Admitting: Internal Medicine

## 2016-12-05 ENCOUNTER — Telehealth: Payer: Self-pay

## 2016-12-05 DIAGNOSIS — Z01812 Encounter for preprocedural laboratory examination: Secondary | ICD-10-CM

## 2016-12-05 DIAGNOSIS — Z95810 Presence of automatic (implantable) cardiac defibrillator: Secondary | ICD-10-CM

## 2016-12-05 MED ORDER — BACITRACIN 500 UNIT/GM EX OINT: TOPICAL_OINTMENT | CUTANEOUS | 0 refills | Status: DC

## 2016-12-05 NOTE — Telephone Encounter (Signed)
Call received from Pt.  Notified Pt that procedure had been scheduled for 12/15/2016 @ 1500.  Pt should plan to arrive at 1300.  Pt needs pre procedure lab work and to pick up Materials engineer.  Per Pt he can come in December 12, 2016 for labs.  Will schedule lab work and complete instruction letter to leave at front desk with CHG scrub. Per Pt, he had his nares swabbed during last hospitalization 11/13/2016-Pt asking about results.  Per results Pt positive for Staph.  Per Short Stay, Pt will need to do 5 day antibiotic swab to both nares BID.  Order entered.  Will notify Pt when he calls back this afternoon.

## 2016-12-05 NOTE — Telephone Encounter (Signed)
Call placed to Pt.  Pt instructed on staph tx.  Pt indicates understanding.

## 2016-12-06 ENCOUNTER — Encounter: Payer: Self-pay | Admitting: Family Medicine

## 2016-12-06 ENCOUNTER — Ambulatory Visit (INDEPENDENT_AMBULATORY_CARE_PROVIDER_SITE_OTHER): Payer: Medicare Other | Admitting: Family Medicine

## 2016-12-06 VITALS — BP 118/77 | HR 103 | Temp 97.8°F | Ht 72.0 in | Wt 192.0 lb

## 2016-12-06 DIAGNOSIS — I2510 Atherosclerotic heart disease of native coronary artery without angina pectoris: Secondary | ICD-10-CM | POA: Diagnosis not present

## 2016-12-06 DIAGNOSIS — A084 Viral intestinal infection, unspecified: Secondary | ICD-10-CM | POA: Diagnosis not present

## 2016-12-06 MED ORDER — ONDANSETRON HCL 8 MG PO TABS: 8.0000 mg | ORAL_TABLET | Freq: Three times a day (TID) | ORAL | 0 refills | Status: DC | PRN

## 2016-12-06 NOTE — Patient Instructions (Signed)
WE NOW OFFER   [facility] FAST TRACK!!!  SAME DAY Appointments for ACUTE CARE  Such as: Sprains, Injuries, cuts, abrasions, rashes, muscle pain, joint pain, back pain Colds, flu, sore throats, headache, allergies, cough, fever  Ear pain, sinus and eye infections Abdominal pain, nausea, vomiting, diarrhea, upset stomach Animal/insect bites  3 Easy Ways to Schedule: Walk-In Scheduling Call in scheduling Mychart Sign-up: [email or web address]         

## 2016-12-06 NOTE — Progress Notes (Signed)
   Subjective:    Patient ID: Elmo Putt, male    DOB: July 24, 1932, 83 y.o.   MRN: [id number]  HPI Here for 2 days of mild nausea and vomiting with diarrhea. No fever or pain. Drinking fluids. He is scheduled to have the broken ICD lead changed out on 12-15-16.    Review of Systems  Constitutional: Negative.   Eyes: Negative.   Respiratory: Negative.   Cardiovascular: Negative.   Gastrointestinal: Positive for diarrhea, nausea and vomiting. Negative for abdominal distention, abdominal pain, anal bleeding, blood in stool and constipation.       Objective:   Physical Exam  Constitutional: He appears well-developed and well-nourished. No distress.  Cardiovascular: Normal rate, normal heart sounds and intact distal pulses.  Irregular rhythm   Pulmonary/Chest: Effort normal and breath sounds normal. No respiratory distress. He has no wheezes. He has no rales.  Abdominal: Soft. Bowel sounds are normal. He exhibits no distension and no mass. There is no tenderness. There is no rebound and no guarding.          Assessment & Plan:  Viral enteritis. Drink fluids. Use Zofran for nausea. Recheck prn. Alysia Penna, MD

## 2016-12-07 ENCOUNTER — Telehealth: Payer: Self-pay | Admitting: Family Medicine

## 2016-12-07 DIAGNOSIS — M1711 Unilateral primary osteoarthritis, right knee: Secondary | ICD-10-CM | POA: Diagnosis not present

## 2016-12-07 DIAGNOSIS — M25561 Pain in right knee: Secondary | ICD-10-CM | POA: Diagnosis not present

## 2016-12-07 NOTE — Telephone Encounter (Signed)
° ° ° °  Pt call to say he is having surgery next week and was told to get a bacteria med [facility]

## 2016-12-07 NOTE — Telephone Encounter (Signed)
Call in Bactroban 2% ointment to apply to both nostrils bid, 30 grams with no rf

## 2016-12-08 ENCOUNTER — Other Ambulatory Visit: Payer: Medicare Other | Admitting: *Deleted

## 2016-12-08 DIAGNOSIS — Z95810 Presence of automatic (implantable) cardiac defibrillator: Secondary | ICD-10-CM | POA: Diagnosis not present

## 2016-12-08 DIAGNOSIS — K219 Gastro-esophageal reflux disease without esophagitis: Secondary | ICD-10-CM | POA: Diagnosis not present

## 2016-12-08 DIAGNOSIS — Z01812 Encounter for preprocedural laboratory examination: Secondary | ICD-10-CM

## 2016-12-08 MED ORDER — MUPIROCIN 2 % EX OINT: 1.0000 "application " | TOPICAL_OINTMENT | Freq: Two times a day (BID) | CUTANEOUS | 0 refills | Status: DC

## 2016-12-08 NOTE — Telephone Encounter (Signed)
Rx was sent in to pt's pharmacy [facility]. Called and spoke to pt. Pt advised Rx was sent.

## 2016-12-09 LAB — BASIC METABOLIC PANEL
BUN/Creatinine Ratio: 11 (ref 10–24)
BUN: 16 mg/dL (ref 8–27)
CALCIUM: 9.4 mg/dL (ref 8.6–10.2)
CHLORIDE: 99 mmol/L (ref 96–106)
CO2: 25 mmol/L (ref 20–29)
Creatinine, Ser: 1.48 mg/dL — ABNORMAL HIGH (ref 0.76–1.27)
GFR, EST AFRICAN AMERICAN: 51 mL/min/{1.73_m2} — AB (ref >59)
GFR, EST NON AFRICAN AMERICAN: 44 mL/min/{1.73_m2} — AB (ref >59)
Glucose: 95 mg/dL (ref 65–99)
Potassium: 4.2 mmol/L (ref 3.5–5.2)
SODIUM: 138 mmol/L (ref 134–144)

## 2016-12-09 LAB — CBC
HEMATOCRIT: 39.5 % (ref 37.5–51.0)
Hemoglobin: 13.2 g/dL (ref 13.0–17.7)
MCH: 30.1 pg (ref 26.6–33.0)
MCHC: 33.4 g/dL (ref 31.5–35.7)
MCV: 90 fL (ref 79–97)
PLATELETS: 195 10*3/uL (ref 150–379)
RBC: 4.39 x10E6/uL (ref 4.14–5.80)
RDW: 14.5 % (ref 12.3–15.4)
WBC: 8.4 10*3/uL (ref 3.4–10.8)

## 2016-12-12 ENCOUNTER — Telehealth: Payer: Self-pay | Admitting: Internal Medicine

## 2016-12-12 ENCOUNTER — Other Ambulatory Visit: Payer: Medicare Other

## 2016-12-12 NOTE — Telephone Encounter (Signed)
New Message     Needs orders for pre admission visit 11/13

## 2016-12-12 NOTE — Telephone Encounter (Signed)
Message sent to attending and APP notifying of message.

## 2016-12-12 NOTE — Progress Notes (Signed)
Pt. Has no orders for surgical procedure.  Dr. Tanna Furry office called for orders.

## 2016-12-13 ENCOUNTER — Encounter (HOSPITAL_COMMUNITY)
Admission: RE | Admit: 2016-12-13 | Discharge: 2016-12-13 | Disposition: A | Discharge status: Home / Self Care | Payer: Medicare Other | Source: Ambulatory Visit | Attending: Internal Medicine | Admitting: Internal Medicine

## 2016-12-13 ENCOUNTER — Encounter (HOSPITAL_COMMUNITY): Payer: Self-pay

## 2016-12-13 ENCOUNTER — Ambulatory Visit (HOSPITAL_COMMUNITY)
Admission: RE | Admit: 2016-12-13 | Discharge: 2016-12-13 | Disposition: A | Discharge status: Home / Self Care | Payer: Medicare Other | Source: Ambulatory Visit | Attending: Anesthesiology | Admitting: Anesthesiology

## 2016-12-13 ENCOUNTER — Other Ambulatory Visit: Payer: Self-pay

## 2016-12-13 DIAGNOSIS — J90 Pleural effusion, not elsewhere classified: Secondary | ICD-10-CM | POA: Insufficient documentation

## 2016-12-13 DIAGNOSIS — D509 Iron deficiency anemia, unspecified: Secondary | ICD-10-CM | POA: Insufficient documentation

## 2016-12-13 DIAGNOSIS — J9811 Atelectasis: Secondary | ICD-10-CM | POA: Insufficient documentation

## 2016-12-13 DIAGNOSIS — Z01818 Encounter for other preprocedural examination: Secondary | ICD-10-CM | POA: Diagnosis not present

## 2016-12-13 DIAGNOSIS — K219 Gastro-esophageal reflux disease without esophagitis: Secondary | ICD-10-CM | POA: Diagnosis not present

## 2016-12-13 DIAGNOSIS — Z01812 Encounter for preprocedural laboratory examination: Secondary | ICD-10-CM | POA: Diagnosis not present

## 2016-12-13 DIAGNOSIS — I472 Ventricular tachycardia: Secondary | ICD-10-CM | POA: Diagnosis not present

## 2016-12-13 DIAGNOSIS — I5022 Chronic systolic (congestive) heart failure: Secondary | ICD-10-CM | POA: Diagnosis not present

## 2016-12-13 DIAGNOSIS — I482 Chronic atrial fibrillation: Secondary | ICD-10-CM | POA: Diagnosis not present

## 2016-12-13 DIAGNOSIS — Z95810 Presence of automatic (implantable) cardiac defibrillator: Secondary | ICD-10-CM | POA: Diagnosis not present

## 2016-12-13 DIAGNOSIS — Z7901 Long term (current) use of anticoagulants: Secondary | ICD-10-CM | POA: Diagnosis not present

## 2016-12-13 DIAGNOSIS — I700 Atherosclerosis of aorta: Secondary | ICD-10-CM | POA: Insufficient documentation

## 2016-12-13 DIAGNOSIS — R05 Cough: Secondary | ICD-10-CM | POA: Insufficient documentation

## 2016-12-13 HISTORY — DX: Presence of automatic (implantable) cardiac defibrillator: Z95.810

## 2016-12-13 LAB — PREPARE RBC (CROSSMATCH)

## 2016-12-13 LAB — ABO/RH: ABO/RH(D): O POS

## 2016-12-13 LAB — SURGICAL PCR SCREEN
MRSA, PCR: NEGATIVE
Staphylococcus aureus: POSITIVE — AB

## 2016-12-13 NOTE — Progress Notes (Signed)
Lengthy visit between pharmacy tech- pt. Pt. Not knowledgable about the name of the meds.  Pt. Brought a list but he does the pill pack himslef but he goes by what the pills "look like".  Pt. Poor historian. Lives alone, has no family, 2 children but he doesn't know where the daughter lives, son is in [place].

## 2016-12-13 NOTE — Progress Notes (Signed)
Pt. Reports having a cold, states he has congestion,  coughing while in PAT appt. Has production of mostly clear mucus. Chart will be sent for further anesht. Review.

## 2016-12-13 NOTE — Pre-Procedure Instructions (Signed)
[name] [facility]  12/13/2016      [id number] [address], [place] [address] [place] [address] Phone: [phone] Fax: [place], [place] [place] [place] [address] Phone: [phone] Fax: [phone]    Your procedure is scheduled on 12/15/2016- Thursday  Report to [facility] at 1 PM. On THURSDAY  Call this number if you have problems the morning of surgery:  [phone]   Remember:  Do not eat food or drink liquids after midnight.   Take these medicines the morning of surgery with A SIP OF WATER :  CARVEDILOL, DEXILANT, use ADVAIR inhaler  If needed take Loratidine , Zantac & / or TRAMADOL   Do not wear jewelry   Do not wear lotions, powders, or perfumes, or deoderant.   Men may shave face and neck.   Do not bring valuables to the hospital.   [facility] is not responsible for any belongings or valuables.  Contacts, dentures or bridgework may not be worn into surgery.  Leave your suitcase in the car.  After surgery it may be brought to your room.  For patients admitted to the hospital, discharge time will be determined by your treatment team.  Patients discharged the day of surgery will not be allowed to drive home.   Name and phone number of your driver:  NONE  Special instructions:  Special Instructions: [facility] - Preparing for Surgery  Before surgery, you can play an important role.  Because skin is not sterile, your skin needs to be as free of germs as possible.  You can reduce the number of germs on you skin by washing with CHG (chlorahexidine gluconate) soap before surgery.  CHG is an antiseptic cleaner which kills germs and bonds with the skin to continue killing germs even after washing.  Please DO NOT use if you have an allergy to CHG or antibacterial soaps.  If your skin becomes reddened/irritated stop  using the CHG and inform your nurse when you arrive at Short Stay.  Do not shave (including legs and underarms) for at least 48 hours prior to the first CHG shower.  You may shave your face.  Please follow these instructions carefully:   1.  Shower with CHG Soap the night before surgery and the  morning of Surgery.  2.  If you choose to wash your hair, wash your hair first as usual with your  normal shampoo.  3.  After you shampoo, rinse your hair and body thoroughly to remove the  Shampoo.  4.  Use CHG as you would any other liquid soap.  You can apply chg directly to the skin and wash gently with scrungie or a clean washcloth.  5.  Apply the CHG Soap to your body ONLY FROM THE NECK DOWN.    Do not use on open wounds or open sores.  Avoid contact with your eyes, ears, mouth and genitals (private parts).  Wash genitals (private parts)   with your normal soap.  6.  Wash thoroughly, paying special attention to the area where your surgery will be performed.  7.  Thoroughly rinse your body with warm water from the neck down.  8.  DO NOT shower/wash with your normal soap after using and rinsing off   the CHG Soap.  9.  Pat yourself dry with a clean towel.  10.  Wear clean pajamas.            11.  Place clean sheets on your bed the night of your first shower and do not sleep with pets.  Day of Surgery  Do not apply any lotions/deodorants the morning of surgery.  Please wear clean clothes to the hospital/surgery center.  Please read over the following fact sheets that you were given. Pain Booklet, Coughing and Deep Breathing, MRSA Information and Surgical Site Infection Prevention

## 2016-12-13 NOTE — Progress Notes (Signed)
Pt. Currently using Bacitracin 2 times daily

## 2016-12-13 NOTE — Progress Notes (Signed)
Call to [facility]- with [employer], informed of procedure sch. ; & date & time.

## 2016-12-14 ENCOUNTER — Encounter (HOSPITAL_COMMUNITY): Payer: Self-pay | Admitting: Certified Registered"

## 2016-12-14 ENCOUNTER — Encounter (HOSPITAL_COMMUNITY): Payer: Self-pay | Admitting: Vascular Surgery

## 2016-12-14 NOTE — Progress Notes (Signed)
Anesthesia Chart Review: Patient is a 83 year old male scheduled for implantable cardioverter defibrillator lead extraction with new lead implant on 12/15/16 by Dt. Cristopher Peru. No back-up CT surgeon is listed on posting, but confirmed with [place] that either Dr. Darylene Price or Dr. Gilford Raid will be available.    History includes former smoker (quit '78), MI '83 and '90, CAD, (CABG '83, stent '04) ischemic cardiomyopathy, chronic systolic CHF, VT (~ 10 years ago), Medtronic ICD (originally place 1990 in [place]/t  VT; generator 04/08/16), afib (no anticoagulation d/t SDH history), HTN, dyslipidemia, DM2, GERD, erythrocytosis, asthma, SDH 08/2011, cholecystectomy, tonsillectomy. Hospitalized 11/10/16 - 11/14/16 for fractured ICD lead. Replacement of lead was considered, but ultimately postponed until he could be re-evaluated by Dr. Lovena Le to determine if lead replacement was appropriate.   - PCP is Dr. Alysia Penna. Last visit 12/06/16 for viral enteritis.  - Primary cardiologist is Dr. Peter [name]. EP cardiologist is Dr. Cristopher Peru. - HEM-ONC is Dr. 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 Continued yearly monitoring recommended.   Meds include Advair, aspirin 81 mg, Coreg, Dexllalnt, Flonase, Lasix, Neurontin, Mucinex, Linzess, lisinopril, Claritin, Zantac, tramadol.   BP 134/74   Pulse 84   Temp 36.6 C   Resp 20   Ht 6' (1.829 m)   Wt 193 lb 3.2 oz (87.6 kg)   SpO2 98%   BMI 26.20 kg/m   EKG 11/30/16: Afib at 80 bpm, non-specific ST/T wave abnormality, probably digitalis effect.   Echo 09/10/12: Impressions: - Extremely poor acoustic windows Structures are not seen well enough to evaluate function. Would reorder when patient can cooperate/move to help optimize.  Echo 08/13/11: Study Conclusions - Left ventricle: Technically severely limited study.There is motion of the apical septal and apical lateral walls. The anterior septum and  inferior septum seem to move.The inferior wall and inferolateral walls can not be assessed. There is motion of the mid-lateral segment, but hypokinesis of the basal lateral segment. Anterior wall cannot be assessed. The EF can not be estimated. The cavity size was mildly to moderately dilated. - Aortic valve: Poorly visualized. The valve appears to be grossly normal. - Mitral valve: Poorly visualized. The valve appears to be grossly normal. - Right ventricle: Pacer wire or catheter noted in right ventricle. (TTE 05/14/09: LVEF 35-40%.)  Cardiac cath 10/17/06: - 3V native CAD.   - SVG-LAD His SVG to his LAD was patent. was widely patent with an excellent anastomosis to the mid-LAD after the second diagonal branch.  There was retrograde filling of two moderate size diagonal branches.  The most superior one had a 60-70% narrowing in the proximal portion which was unchanged.  Retrograde to this, there was 70% narrowing of the LAD before a large septal perforator.  The septal perforator trifurcated and provided collaterals to a large RV bifurcating marginal branch and to the distal circumflex.  The distal LAD provided collaterals to the circumflex.did have -   The previously placed tandem DES Cypher stents from October 29, 2002 were widely patent with less than 10% narrowing.  Several small diagonal branches were visualized in the distal third of the LAD.  This was essentially unchanged from post- stenting angiogram after PCI of October 29, 2002. - RCA totally occluded in its proximal third with no antegrade filling. - EF ~ 40%. Moderately severe global hypokinesis. Akinesis of a small area of the apex and hypokinesis of the basilar third of the inferior wall.  - Medical  treatment.  Carotid U/S 09/10/12: Summary: Mild intimal thickening with mild plaque in bifurcation areas. No significant ICA stenosis, 1-39% stenosis.  CXR 12/13/16: IMPRESSION: Stable minimal bibasilar  subsegmental atelectasis or scarring. Stable minimal bilateral pleural effusions. Aortic atherosclerosis.  Preoperative labs noted. Cr 1.48 (previously 1.39), glucose 95, CBC WNL. T&C done.   Patient apparently recovering from a cold. Unfortunately, anesthesia APP was not asked to evaluate at PAT. Afebrile. CXR did not show pneumonia. O2 sat 98%. CBC WNL. Anesthesiologist to evaluate on the day of surgery. I pulmonary exam unremarkable and does not appear acutely ill then I would anticipate that he could proceed as planned from an anesthesia standpoint.  George Hugh [facility] Short Stay Center/Anesthesiology Phone [phone] 12/14/2016 10:45 AM

## 2016-12-15 ENCOUNTER — Inpatient Hospital Stay (HOSPITAL_COMMUNITY): Admission: RE | Admit: 2016-12-15 | Payer: Medicare Other | Source: Ambulatory Visit | Admitting: Internal Medicine

## 2016-12-15 ENCOUNTER — Telehealth: Payer: Self-pay | Admitting: Internal Medicine

## 2016-12-15 ENCOUNTER — Encounter (HOSPITAL_COMMUNITY): Admission: RE | Payer: Self-pay | Source: Ambulatory Visit

## 2016-12-15 ENCOUNTER — Ambulatory Visit: Payer: Self-pay | Admitting: *Deleted

## 2016-12-15 LAB — TYPE AND SCREEN
ABO/RH(D): O POS
Antibody Screen: NEGATIVE
UNIT DIVISION: 0
Unit division: 0

## 2016-12-15 LAB — BPAM RBC
BLOOD PRODUCT EXPIRATION DATE: 201811212359
Blood Product Expiration Date: 201812052359
ISSUE DATE / TIME: 201811071520
ISSUE DATE / TIME: 201811041227
UNIT TYPE AND RH: 5100
Unit Type and Rh: 5100

## 2016-12-15 SURGERY — REMOVAL, ELECTRODE LEAD, ICD
Anesthesia: General | Site: Chest

## 2016-12-15 NOTE — Telephone Encounter (Signed)
News message     Patient calling states he can not have procedure done today, he is not feeling well.  Wants to discuss implant. Please call

## 2016-12-15 NOTE — Telephone Encounter (Signed)
A week ago I took a medicine to make me go to the bathroom because I've been having trouble going.  (BM).   I had an accident in my shorts and couldn't make it to the bathroom in time.    I got constipated again so I took another pill today to make me go and I almost had an accident again.  The doctor started me on Linzess I think a couple of months ago.    I take it every day.  Dr Radene Gunning prescribed it for me. I was supposed to have my defibrillator wire fixed today but I called the hospital and cancelled the surgery because of the diarrhea.  It has happened twice this week. I take Linzess every day. I got an appt with Dr. Sharlene Motts on Tues Nov. 20, 2018  At 8:15am. He stated he wrote it down.   "I'm so embarrassed about all this".  "When it hits it hits"   "I'm here all alone with no one to help me or go get me anything"   He does drive and stated [facility].   He is going to go tomorrow and get some pads. Since the diarrhea was not profuse and he is taking liquids I was not able to get him in within the 24 hrs recommended.    Reason for Disposition . [1] MODERATE diarrhea (e.g., 4-6 times / day more than normal) AND [2] age > 70 years  Answer Assessment - Initial Assessment Questions 1. DIARRHEA SEVERITY: "How bad is the diarrhea?" "How many extra stools have you had in the past 24 hours than normal?" A week ago I was having a hard time going to the bathroom (BM) so I took some medicine to help me go.   I had an accident and didn't make it to the bathroom.      - MILD: Few loose or mushy BMs; increase of 1-3 stools over normal daily number of stools; mild increase in ostomy output.   - MODERATE: Increase of 4-6 stools daily over normal; moderate increase in ostomy output.   - SEVERE (or Worst Possible): Increase of 7 or more stools daily over normal; moderate increase in ostomy output; incontinence.     It's happened twice in the last week. 2. ONSET: "When did the  diarrhea begin?"      A week ago I took some medicine to help me go.   I couldn't go. 3. BM CONSISTENCY: "How loose or watery is the diarrhea?"      Loose and I can't control it. 4. VOMITING: "Are you also vomiting?" If so, ask: "How many times in the past 24 hours?"      No 5. ABDOMINAL PAIN: "Are you having any abdominal pain?" If yes: "What does it feel like?" (e.g., crampy, dull, intermittent, constant)      No pain 6. ABDOMINAL PAIN SEVERITY: If present, ask: "How bad is the pain?"  (e.g., Scale 1-10; mild, moderate, or severe)    - MILD (1-3): doesn't interfere with normal activities, abdomen soft and not tender to touch     - MODERATE (4-7): interferes with normal activities or awakens from sleep, tender to touch     - SEVERE (8-10): excruciating pain, doubled over, unable to do any normal activities       No pain 7. ORAL INTAKE: If vomiting, "Have you been able to drink liquids?" "How much fluids have you had in the  past 24 hours?"     I've been drinking my liquids and eating. 8. HYDRATION: "Any signs of dehydration?" (e.g., dry mouth [not just dry lips], too weak to stand, dizziness, new weight loss) "When did you last urinate?"      9. EXPOSURE: "Have you traveled to a foreign country recently?" "Have you been exposed to anyone with diarrhea?" "Could you have eaten any food that was spoiled?"     *No Answer* 10. OTHER SYMPTOMS: "Do you have any other symptoms?" (e.g., fever, blood in stool)       No 11. PREGNANCY: "Is there any chance you are pregnant?" "When was your last menstrual period?"       *No Answer*  Protocols used: DIARRHEA-A-AH

## 2016-12-15 NOTE — Telephone Encounter (Signed)
Returned call to Pt.  Per Pt he has diarrhea and he also cannot get out in this weather.  Notified involved parties.  Cancelled with OR.  OR states procedure already cancelled.  Notified Pt this nurse would be in touch to reschedule.

## 2016-12-16 ENCOUNTER — Encounter (HOSPITAL_COMMUNITY): Payer: Self-pay | Admitting: *Deleted

## 2016-12-16 ENCOUNTER — Emergency Department (HOSPITAL_COMMUNITY)
Admission: EM | Admit: 2016-12-16 | Discharge: 2016-12-16 | Disposition: A | Discharge status: Home / Self Care | Payer: Medicare Other | Attending: Emergency Medicine | Admitting: Emergency Medicine

## 2016-12-16 ENCOUNTER — Emergency Department (HOSPITAL_COMMUNITY): Payer: Medicare Other

## 2016-12-16 ENCOUNTER — Other Ambulatory Visit: Payer: Self-pay

## 2016-12-16 DIAGNOSIS — I2510 Atherosclerotic heart disease of native coronary artery without angina pectoris: Secondary | ICD-10-CM | POA: Diagnosis not present

## 2016-12-16 DIAGNOSIS — I252 Old myocardial infarction: Secondary | ICD-10-CM | POA: Insufficient documentation

## 2016-12-16 DIAGNOSIS — Z951 Presence of aortocoronary bypass graft: Secondary | ICD-10-CM | POA: Insufficient documentation

## 2016-12-16 DIAGNOSIS — K5900 Constipation, unspecified: Secondary | ICD-10-CM | POA: Diagnosis not present

## 2016-12-16 DIAGNOSIS — I110 Hypertensive heart disease with heart failure: Secondary | ICD-10-CM | POA: Insufficient documentation

## 2016-12-16 DIAGNOSIS — I5022 Chronic systolic (congestive) heart failure: Secondary | ICD-10-CM | POA: Diagnosis not present

## 2016-12-16 DIAGNOSIS — E119 Type 2 diabetes mellitus without complications: Secondary | ICD-10-CM | POA: Insufficient documentation

## 2016-12-16 DIAGNOSIS — J45909 Unspecified asthma, uncomplicated: Secondary | ICD-10-CM | POA: Diagnosis not present

## 2016-12-16 DIAGNOSIS — Z95810 Presence of automatic (implantable) cardiac defibrillator: Secondary | ICD-10-CM | POA: Diagnosis not present

## 2016-12-16 DIAGNOSIS — R1013 Epigastric pain: Secondary | ICD-10-CM | POA: Diagnosis not present

## 2016-12-16 DIAGNOSIS — N200 Calculus of kidney: Secondary | ICD-10-CM | POA: Diagnosis not present

## 2016-12-16 DIAGNOSIS — R000 Tachycardia, unspecified: Secondary | ICD-10-CM | POA: Diagnosis not present

## 2016-12-16 DIAGNOSIS — Z79899 Other long term (current) drug therapy: Secondary | ICD-10-CM | POA: Insufficient documentation

## 2016-12-16 DIAGNOSIS — Z87891 Personal history of nicotine dependence: Secondary | ICD-10-CM | POA: Insufficient documentation

## 2016-12-16 DIAGNOSIS — R1033 Periumbilical pain: Secondary | ICD-10-CM | POA: Diagnosis present

## 2016-12-16 LAB — COMPREHENSIVE METABOLIC PANEL
ALT: 10 U/L — AB (ref 17–63)
AST: 21 U/L (ref 15–41)
Albumin: 4.1 g/dL (ref 3.5–5.0)
Alkaline Phosphatase: 83 U/L (ref 38–126)
Anion gap: 9 (ref 5–15)
BUN: 14 mg/dL (ref 6–20)
CHLORIDE: 105 mmol/L (ref 101–111)
CO2: 24 mmol/L (ref 22–32)
Calcium: 9.4 mg/dL (ref 8.9–10.3)
Creatinine, Ser: 1.35 mg/dL — ABNORMAL HIGH (ref 0.61–1.24)
GFR calc Af Amer: 55 mL/min — ABNORMAL LOW (ref >60)
GFR, EST NON AFRICAN AMERICAN: 48 mL/min — AB (ref >60)
Glucose, Bld: 120 mg/dL — ABNORMAL HIGH (ref 65–99)
POTASSIUM: 4.1 mmol/L (ref 3.5–5.1)
SODIUM: 138 mmol/L (ref 135–145)
Total Bilirubin: 1.3 mg/dL — ABNORMAL HIGH (ref 0.3–1.2)
Total Protein: 7.4 g/dL (ref 6.5–8.1)

## 2016-12-16 LAB — CBC
HEMATOCRIT: 43.3 % (ref 39.0–52.0)
HEMOGLOBIN: 14.3 g/dL (ref 13.0–17.0)
MCH: 30.1 pg (ref 26.0–34.0)
MCHC: 33 g/dL (ref 30.0–36.0)
MCV: 91.2 fL (ref 78.0–100.0)
Platelets: 171 10*3/uL (ref 150–400)
RBC: 4.75 MIL/uL (ref 4.22–5.81)
RDW: 14.6 % (ref 11.5–15.5)
WBC: 5.6 10*3/uL (ref 4.0–10.5)

## 2016-12-16 LAB — URINALYSIS, ROUTINE W REFLEX MICROSCOPIC
Bilirubin Urine: NEGATIVE
GLUCOSE, UA: NEGATIVE mg/dL
HGB URINE DIPSTICK: NEGATIVE
Ketones, ur: NEGATIVE mg/dL
LEUKOCYTES UA: NEGATIVE
Nitrite: NEGATIVE
PH: 8 (ref 5.0–8.0)
Protein, ur: NEGATIVE mg/dL
SPECIFIC GRAVITY, URINE: 1.014 (ref 1.005–1.030)

## 2016-12-16 LAB — LIPASE, BLOOD: LIPASE: 24 U/L (ref 11–51)

## 2016-12-16 MED ORDER — FENTANYL CITRATE (PF) 100 MCG/2ML IJ SOLN
50.0000 ug | Freq: Once | INTRAMUSCULAR | Status: AC
  Administered 2016-12-16: 50 ug via INTRAVENOUS
  Filled 2016-12-16: qty 2

## 2016-12-16 MED ORDER — IOPAMIDOL (ISOVUE-370) INJECTION 76%
INTRAVENOUS | Status: AC
  Administered 2016-12-16: 100 mL
  Filled 2016-12-16: qty 100

## 2016-12-16 NOTE — ED Provider Notes (Signed)
[facility] EMERGENCY DEPARTMENT Provider Note  CSN: [id number] Arrival date & time: 12/16/16 1020  Chief Complaint(s) Abdominal Pain and Atrial Fibrillation  HPI [name] is a 83 y.o. male with an extensive past medical history listed below pertinent for atrial fibrillation not on any anticoagulation who presents to the emergency department for 2 days of constant aching periumbilical and epigastric abdominal pain.  Pain is been unchanged since onset.  No alleviating or aggravating factors.  No associated fever, chills, nausea, vomiting, chest pain, shortness of breath.  Denies any hematochezia or melena.  Patient endorses alternating constipation and diarrhea for the past 2 weeks which is new for him.  Denies any urinary symptoms.  No other associated symptoms.  No other alleviating or aggravating factors.  HPI  Past Medical History Past Medical History:  Diagnosis Date  . Abnormal thyroid scan    Abnormal thyroid imaging studies from 11/09/2010, status post ultrasound guided fine needle aspiration of the dominant left inferior thyroid nodule on 12/15/2010. Cytology report showed rare follicular epithelial cells and hemosiderin laden macrophages.  Marland Kitchen AICD (automatic cardioverter/defibrillator) present    fx lead   . Arthritis    "all over"  . Asthma   . Atrial fibrillation ([place])    on chronic Coumadin; stopped July 2013 due to subdural hematomas  . CHF (congestive heart failure) (HCC)    EF 35-40% s/p most recent ICD generator change-out with [employer] dual-chamber ICD 05/20/11 with explantation of previous abdominally-implanted device  . Coronary artery disease    s/p CABG 1983 and PCI/stent 2004.   . Diabetes mellitus    diet controlled  . Dyslipidemia   . Erythrocytosis   . GERD (gastroesophageal reflux disease)   . Hypertension   . Ischemic cardiomyopathy    WITH CHF  . Monocytosis 04/17/2013  . Myocardial infarction ([place]) 1983; ~ 1990  . 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 Pneumonia August 2013  . Subdural hematoma [facility]) July 2013   Anticoagulation stopped.   . VT (ventricular tachycardia) ([place])   . Wears glasses    Patient Active Problem List   Diagnosis Date Noted  . Failure of implantable cardioverter-defibrillator lead 11/10/2016  . ICD (implantable cardioverter-defibrillator) in place 04/08/2016  . Gastroesophageal reflux disease without esophagitis 12/31/2013  . Hypotension 09/23/2011  . HCAP (healthcare-associated pneumonia) 09/23/2011  . SIRS (systemic inflammatory response syndrome) ([place]) 09/23/2011  . Thrombocytopenia ([place]) 09/23/2011  . History of subdural hemorrhage 09/08/2011  . Ataxia 08/17/2011  . Dehydration 08/17/2011  . Atrial fibrillation with controlled ventricular response ([place]) 08/13/2011  . Colon polyp 07/07/2011  . Angiodysplasia of colon 07/07/2011  . GERD (gastroesophageal reflux disease) 05/31/2011  . Anemia, iron deficiency 12/29/2010  . Chronic anticoagulation discontinued July 2013 after developed subdural hematoma 09/22/2010  . Bradycardia 09/22/2010  . CAD (coronary artery disease) 06/25/2010  . ventricular tachycardia 12/30/2009  . Chronic atrial fibrillation ([place]) 12/30/2009  . Chronic systolic heart failure- EF 35-40% 12/30/2009  . Automatic implantable cardioverter-defibrillator in situ 12/30/2009   Home Medication(s) Prior to Admission medications   Medication Sig Start Date End Date Taking? Authorizing Provider  ADVAIR DISKUS 250-50 MCG/DOSE AEPB Inhale 1 puff into the lungs 2 (two) times daily.  11/13/14   [provider]  aspirin 81 MG tablet Take 81-325 mg daily as needed by mouth for headache.    [provider]  calcium carbonate (TUMS - DOSED IN MG ELEMENTAL CALCIUM) 500 MG chewable tablet Chew 3 tablets by mouth daily  as needed for heartburn.     [provider]  carvedilol (COREG) 12.5 MG tablet TAKE 1 TABLET (12.5 MG TOTAL) BY MOUTH 2 (TWO) TIMES DAILY. 12/05/16    Evans Lance, MD  DEXILANT 60 MG capsule Take 60 mg daily by mouth.  10/24/16   [provider]  diclofenac sodium (VOLTAREN) 1 % GEL Apply 2 g topically 4 (four) times daily. As needed Patient not taking: Reported on 12/13/2016 08/05/16   [name], MD  fluticasone [facility]) 50 MCG/ACT nasal spray Place 2 sprays into the nose daily as needed for allergies.     [provider]  furosemide (LASIX) 40 MG tablet TAKE 1 TABLET (40 MG TOTAL) BY MOUTH DAILY. MAY TAKE AN EXTRA TAB DAILY AS NEEDED FOR SWELLING Patient taking differently: Take 40 mg daily as needed by mouth for edema.  01/18/16   [name], Peter M, MD  gabapentin (NEURONTIN) 100 MG capsule TAKE 1 CAPSULE (100MG ) TO 3 CAPSULES (300MG ) EACH NIGHT AS NEEDED FOR MUSCLE PAINS Patient taking differently: Take 200 mg at bedtime by mouth.  10/05/16   [name], MD  guaiFENesin (MUCINEX) 600 MG 12 hr tablet Take 600 mg 2 (two) times daily by mouth.    [provider]  LINZESS 72 MCG capsule Take 72 mcg by mouth daily. 03/07/16   [provider]  lisinopril (PRINIVIL,ZESTRIL) 10 MG tablet Take 10 mg by mouth daily.    [provider]  loratadine (CLARITIN) 10 MG tablet Take 10 mg by mouth daily.     [provider]  nitroGLYCERIN (NITROSTAT) 0.4 MG SL tablet PLACE ONE UNDER TONGUE FOR CHEST PAIN. Patient not taking: Reported on 12/06/2016 11/22/16   [name], Peter M, MD  ondansetron (ZOFRAN) 8 MG tablet Take 1 tablet (8 mg total) every 8 (eight) hours as needed by mouth for nausea or vomiting. 12/06/16   [name], MD  polyethylene glycol [facility] / Floria Raveling) packet Take 17 g by mouth daily. Patient taking differently: Take 17 g daily as needed by mouth for mild constipation.  04/14/16   Charlesetta Shanks, MD  potassium chloride (K-DUR,KLOR-CON) 10 MEQ tablet Take 1 tablet (10 mEq total) by mouth daily. Patient not taking: Reported on 12/13/2016 01/09/13   [name], Peter M, MD  ranitidine  (ZANTAC) 300 MG tablet Take 1 tablet (300 mg total) by mouth daily. Patient taking differently: Take 300 mg daily as needed by mouth for heartburn.  10/05/16   [name], MD  RESTASIS 0.05 % ophthalmic emulsion INSTILL 1 DROP INTO BOTH EYES TWICE A DAY Patient not taking: Reported on 12/13/2016 11/17/16   [name], MD  traMADol (ULTRAM) 50 MG tablet TAKE 2 TABLETS BY MOUTH EVERY 8 HOURS AS NEEDED Patient taking differently: TAKE 100mg  BY MOUTH EVERY 8 HOURS AS NEEDED 08/02/16   [name], MD  Past Surgical History Past Surgical History:  Procedure Laterality Date  . CHOLECYSTECTOMY    . COLONOSCOPY N/A 07/07/2011   Performed by Jerene Bears, MD at [place]  . CORONARY ANGIOPLASTY WITH STENT PLACEMENT  2004   Tandem Cypher stents LAD  . CORONARY ARTERY BYPASS GRAFT  1983   SVG-mLAD  . DEBRIDEMENT PROXIMAL INTERPHALANGEAL FUSION (PIP) Right 05/10/2013   Performed by Daryll Brod, MD at [facility]  . ESOPHAGOGASTRODUODENOSCOPY (EGD) N/A 02/11/2011   Performed by Beryle Beams, MD at [place]  . EXCISION MASS RIGHT THUMB Right 05/10/2013   Performed by Daryll Brod, MD at [facility]  . ICD GENERATOR CHANGE N/A 05/20/2011   Performed by Evans Lance, MD at [facility] CATH LAB  . ICD Generator Changeout N/A 04/08/2016   Performed by Evans Lance, MD at [facility] CV LAB  . KNEE ARTHROSCOPY     right; "just went in and scraped it"  . TONSILLECTOMY         Family History Family History  Problem Relation Age of Onset  . Tuberculosis Mother   . Tuberculosis Father   . Heart disease Brother   . Diabetes Sister   . Diabetes Brother   . Clotting disorder Brother     Social History Social History   Tobacco Use  . Smoking status: Former Smoker    Packs/day: 2.00    Years: 30.00    Pack years: 60.00    Types:  Cigarettes    Last attempt to quit: 06/23/1976    Years since quitting: 40.5  . Smokeless tobacco: Never Used  Substance Use Topics  . Alcohol use: No  . Drug use: No   Allergies Celebrex [celecoxib]; Esomeprazole magnesium; Tamsulosin; Dronedarone; Digoxin; Hydrocodone-acetaminophen; and Protonix [pantoprazole sodium]  Review of Systems Review of Systems All other systems are reviewed and are negative for acute change except as noted in the HPI  Physical Exam Vital Signs  I have reviewed the triage vital signs BP 115/85   Pulse 88   Resp (!) 21   SpO2 96%   Physical Exam  Constitutional: He is oriented to person, place, and time. He appears well-developed and well-nourished. No distress.  HENT:  Head: Normocephalic and atraumatic.  Nose: Nose normal.  Eyes: Conjunctivae and EOM are normal. Pupils are equal, round, and reactive to light. Right eye exhibits no discharge. Left eye exhibits no discharge. No scleral icterus.  Neck: Normal range of motion. Neck supple.  Cardiovascular: Normal rate and regular rhythm. Exam reveals no gallop and no friction rub.  No murmur heard. Pulmonary/Chest: Effort normal and breath sounds normal. No stridor. No respiratory distress. He has no rales.  Abdominal: Soft. He exhibits no distension. There is tenderness in the epigastric area and periumbilical area. There is guarding. There is no rigidity, no rebound, no tenderness at McBurney's point and negative Murphy's sign. A hernia is present. Hernia confirmed positive in the ventral area ( Nontender, reducible).    Musculoskeletal: He exhibits no edema or tenderness.  Neurological: He is alert and oriented to person, place, and time.  Skin: Skin is warm and dry. No rash noted. He is not diaphoretic. No erythema.  Psychiatric: He has a normal mood and affect.  Vitals reviewed.   ED Results and Treatments Labs (all labs ordered are listed, but only abnormal results are displayed) Labs  Reviewed  COMPREHENSIVE METABOLIC PANEL - Abnormal; Notable for the following components:  Result Value   Glucose, Bld 120 (*)    Creatinine, Ser 1.35 (*)    ALT 10 (*)    Total Bilirubin 1.3 (*)    GFR calc non Af Amer 48 (*)    GFR calc Af Amer 55 (*)    All other components within normal limits  LIPASE, BLOOD  CBC  URINALYSIS, ROUTINE W REFLEX MICROSCOPIC                                                                                                                         EKG  EKG Interpretation  Date/Time:  Friday December 16 2016 10:30:55 EST Ventricular Rate:  105 PR Interval:    QRS Duration: 116 QT Interval:  370 QTC Calculation: 489 R Axis:   -53 Text Interpretation:  Atrial flutter with predominant 3:1 AV block LAD, consider left anterior fascicular block Borderline low voltage, extremity leads Nonspecific T abnormalities, lateral leads No significant change since last tracing Confirmed by Addison Lank [phone]) on 12/16/2016 12:51:24 PM      Radiology Ct Angio Abd/pel W And/or Wo Contrast  Result Date: 12/16/2016 CLINICAL DATA:  Mid abdominal pain, diarrhea and constipation for 2 weeks, concern for mesenteric ischemia EXAM: CT ANGIOGRAPHY ABDOMEN AND PELVIS WITH CONTRAST AND WITHOUT CONTRAST TECHNIQUE: Multidetector CT imaging of the abdomen and pelvis was performed using the standard protocol during bolus administration of intravenous contrast. Multiplanar reconstructed images and MIPs were obtained and reviewed to evaluate the vascular anatomy. CONTRAST:  119mL ISOVUE-370 IOPAMIDOL (ISOVUE-370) INJECTION 76% COMPARISON:  10/01/2016 FINDINGS: VASCULAR Aorta: Moderate aortic atherosclerosis without significant aneurysm, dissection, or occlusive process. No retroperitoneal abnormality, retroperitoneal hematoma, or evidence of rupture. Celiac: Minor atherosclerosis.  Widely patent including its branches SMA: Minor atherosclerosis.  Widely patent including its branches.  Renals: Atherosclerotic origins but widely patent. Patent accessory renal artery to the right kidney lower pole. IMA: Atherosclerotic origin but remains patent off of the distal aorta. Inflow: Mild iliac ectasia without occlusion, dissection, aneurysm, or stenosis. Common, internal and external iliac arteries remain patent bilaterally. No inflow disease. Proximal Outflow: Common femoral, proximal profunda femoral, proximal superficial femoral arteries demonstrated all remain patent. Veins: No obvious venous abnormality within the limitations of this arterial phase study. Review of the MIP images confirms the above findings. NON-VASCULAR Lower chest: Chronic rounded masslike opacity in the medial right lower lobe, suspect rounded atelectasis. Postop changes in the left lower chest. Remote median sternotomy changes noted. Pacer wires in the right heart. Small right pleural effusion. Minor streaky atelectasis in the left lower lobe. Hepatobiliary: Remote cholecystectomy. No biliary dilatation. No focal hepatic abnormality or biliary dilatation. Hepatic and portal veins remain patent. Pancreas: Unremarkable. No pancreatic ductal dilatation or surrounding inflammatory changes. Spleen: Chronic splenic calcifications centrally. No acute splenic abnormality. No significant interval change. Adrenals/Urinary Tract: Normal adrenal glands. Nonobstructing lower pole intrarenal calculi bilaterally. Mild renal cortical scarring. Small 7 mm right kidney lower pole cyst noted, image 39. No acute hydronephrosis, perinephric  inflammatory process, hydroureter, or ureteral calculus. No bladder abnormality. Stomach/Bowel: Negative for bowel obstruction, significant dilatation, ileus, or free air. Normal appendix demonstrated. No fluid collection or abscess. Minor scattered colonic diverticulosis without acute inflammatory process. Lymphatic: No adenopathy. Reproductive: Prostate is unremarkable. Other: No inguinal abnormality or hernia.  Postop changes in the left abdominal wall with pacer wires noted. Intact abdominal wall. No ventral hernia. Musculoskeletal: Advanced degenerative changes of the spine and lower lumbar facet arthropathy. No acute compression fracture. IMPRESSION: VASCULAR Nonocclusive abdominal atherosclerosis. Patent mesenteric and renal vasculature. Negative for mesenteric or renal vascular occlusive disease. No significant aortoiliac occlusive process, aneurysm or acute vascular finding. NON-VASCULAR Chronic rounded masslike opacity in the right lower lobe, suspect rounded atelectasis. Associated chronic right pleural effusion. Bibasilar atelectasis/ scarring Remote cholecystectomy Bilateral nonobstructing nephrolithiasis Diverticulosis without acute inflammatory process Electronically Signed   By: Jerilynn Mages.  Shick M.D.   On: 12/16/2016 14:44   Pertinent labs & imaging results that were available during my care of the patient were reviewed by me and considered in my medical decision making (see chart for details).  Medications Ordered in ED Medications  iopamidol (ISOVUE-370) 76 % injection (100 mLs  Contrast Given 12/16/16 1332)  fentaNYL (SUBLIMAZE) injection 50 mcg (50 mcg Intravenous Given 12/16/16 1404)                                                                                                                                    Procedures Procedures  (including critical care time)  Medical Decision Making / ED Course I have reviewed the nursing notes for this encounter and the patient's prior records (if available in EHR or on provided paperwork).    Patient is afebrile with stable vital signs.  He is well-appearing, well-hydrated, nontoxic.  Abdomen with epigastric abdominal discomfort.  No evidence of peritonitis.  However given his history of A. fib not on anticoagulation, will obtain a CT angios to assess for mesenteric ischemia.  Labs were grossly reassuring without evidence of ptosis, biliary obstruction  or pancreatitis.  UA without evidence of infection.  CTA without evidence of ischemia or other intra-abdominal inflammatory/infectious process.  Patient reported passing gas and feeling better.  The patient appears reasonably screened and/or stabilized for discharge and I doubt any other medical condition or other [place] requiring further screening, evaluation, or treatment in the ED at this time prior to discharge.  The patient is safe for discharge with strict return precautions.   Final Clinical Impression(s) / ED Diagnoses Final diagnoses:  Epigastric pain   Disposition: Discharge  Condition: Good  I have discussed the results, Dx and Tx plan with the patient who expressed understanding and agree(s) with the plan. Discharge instructions discussed at great length. The patient was given strict return precautions who verbalized understanding of the instructions. No further questions at time of discharge.    ED Discharge Orders    None  Follow Up: [name], MD [place]  [phone]  Schedule an appointment as soon as possible for a visit  As needed      This chart was dictated using voice recognition software.  Despite best efforts to proofread,  errors can occur which can change the documentation meaning.   Fatima Blank, MD 12/16/16 [phone]

## 2016-12-16 NOTE — Discharge Instructions (Signed)

## 2016-12-16 NOTE — ED Triage Notes (Signed)
Pt arrived by [facility] from home. Pt reports alternating between constipation and diarrhea x 2 weeks. Having upper abd pain. Denies any blood in stools but did have n/v x 1 yesterday. Unable to take meds x 2 days, afib on monitor rate 100-130 pta.

## 2016-12-20 ENCOUNTER — Encounter: Payer: Self-pay | Admitting: Family Medicine

## 2016-12-20 ENCOUNTER — Ambulatory Visit (INDEPENDENT_AMBULATORY_CARE_PROVIDER_SITE_OTHER): Payer: Medicare Other | Admitting: Family Medicine

## 2016-12-20 VITALS — BP 138/80 | HR 62 | Temp 95.8°F | Wt 189.9 lb

## 2016-12-20 DIAGNOSIS — I482 Chronic atrial fibrillation, unspecified: Secondary | ICD-10-CM

## 2016-12-20 DIAGNOSIS — Z23 Encounter for immunization: Secondary | ICD-10-CM | POA: Diagnosis not present

## 2016-12-20 DIAGNOSIS — I2510 Atherosclerotic heart disease of native coronary artery without angina pectoris: Secondary | ICD-10-CM | POA: Diagnosis not present

## 2016-12-20 DIAGNOSIS — K5900 Constipation, unspecified: Secondary | ICD-10-CM | POA: Diagnosis not present

## 2016-12-20 DIAGNOSIS — K219 Gastro-esophageal reflux disease without esophagitis: Secondary | ICD-10-CM

## 2016-12-20 DIAGNOSIS — I5022 Chronic systolic (congestive) heart failure: Secondary | ICD-10-CM

## 2016-12-20 MED ORDER — RANITIDINE HCL 150 MG PO TABS: 150.0000 mg | ORAL_TABLET | Freq: Two times a day (BID) | ORAL | 11 refills | Status: DC

## 2016-12-20 NOTE — Addendum Note (Signed)
Addended by: Myriam Forehand on: 12/20/2016 10:53 AM   Modules accepted: Orders

## 2016-12-20 NOTE — Progress Notes (Signed)
   Subjective:    Patient ID: [name], male    DOB: 1932/04/06, 83 y.o.   MRN: [id number]  HPI Here to follow up an ER visit on 12-16-16 for several days of epigastric pain. He had been constipated for 2 weeks prior to this. No nausea or fever. He has been taking Dexilant every day, but he is worried about possible side effects from long term use of this. In the ER his labs were normal. He had a CT angiogram of the abdomen which was normal. Since then his bowels have been moving well and he feels fine.    Review of Systems  Constitutional: Negative.   Respiratory: Negative.   Cardiovascular: Negative.   Gastrointestinal: Positive for abdominal pain and constipation. Negative for abdominal distention, anal bleeding, blood in stool, diarrhea, nausea, rectal pain and vomiting.  Genitourinary: Negative.        Objective:   Physical Exam  Constitutional: He appears well-developed and well-nourished.  Cardiovascular: Normal rate, regular rhythm, normal heart sounds and intact distal pulses.  Pulmonary/Chest: Effort normal and breath sounds normal. No respiratory distress. He has no wheezes. He has no rales.  Abdominal: Soft. Bowel sounds are normal. He exhibits no distension and no mass. There is no tenderness. There is no rebound and no guarding.          Assessment & Plan:  He had some epigastric pain that was probably due to constipation. This has now resolved. I suggested her use Miralax every day to prevent constipation and to drink plenty of water. Also we will stop Dexilant and switch to Zantac 150 mg daily.  Alysia Penna, MD

## 2016-12-23 ENCOUNTER — Emergency Department (HOSPITAL_COMMUNITY): Payer: Medicare Other

## 2016-12-23 ENCOUNTER — Encounter (HOSPITAL_COMMUNITY): Payer: Self-pay | Admitting: Emergency Medicine

## 2016-12-23 ENCOUNTER — Telehealth: Payer: Self-pay | Admitting: Physician Assistant

## 2016-12-23 ENCOUNTER — Emergency Department (HOSPITAL_COMMUNITY)
Admission: EM | Admit: 2016-12-23 | Discharge: 2016-12-23 | Disposition: A | Discharge status: Home / Self Care | Payer: Medicare Other | Attending: Emergency Medicine | Admitting: Emergency Medicine

## 2016-12-23 ENCOUNTER — Other Ambulatory Visit: Payer: Self-pay

## 2016-12-23 DIAGNOSIS — I110 Hypertensive heart disease with heart failure: Secondary | ICD-10-CM | POA: Insufficient documentation

## 2016-12-23 DIAGNOSIS — I2510 Atherosclerotic heart disease of native coronary artery without angina pectoris: Secondary | ICD-10-CM | POA: Diagnosis not present

## 2016-12-23 DIAGNOSIS — R1013 Epigastric pain: Secondary | ICD-10-CM | POA: Insufficient documentation

## 2016-12-23 DIAGNOSIS — I5022 Chronic systolic (congestive) heart failure: Secondary | ICD-10-CM | POA: Diagnosis not present

## 2016-12-23 DIAGNOSIS — Z87891 Personal history of nicotine dependence: Secondary | ICD-10-CM | POA: Insufficient documentation

## 2016-12-23 DIAGNOSIS — E119 Type 2 diabetes mellitus without complications: Secondary | ICD-10-CM | POA: Insufficient documentation

## 2016-12-23 DIAGNOSIS — J45909 Unspecified asthma, uncomplicated: Secondary | ICD-10-CM | POA: Diagnosis not present

## 2016-12-23 DIAGNOSIS — Z79899 Other long term (current) drug therapy: Secondary | ICD-10-CM | POA: Diagnosis not present

## 2016-12-23 DIAGNOSIS — K5730 Diverticulosis of large intestine without perforation or abscess without bleeding: Secondary | ICD-10-CM | POA: Diagnosis not present

## 2016-12-23 DIAGNOSIS — Z951 Presence of aortocoronary bypass graft: Secondary | ICD-10-CM | POA: Insufficient documentation

## 2016-12-23 LAB — I-STAT TROPONIN, ED: Troponin i, poc: 0 ng/mL (ref 0.00–0.08)

## 2016-12-23 LAB — URINALYSIS, ROUTINE W REFLEX MICROSCOPIC
Bilirubin Urine: NEGATIVE
GLUCOSE, UA: NEGATIVE mg/dL
Hgb urine dipstick: NEGATIVE
Ketones, ur: NEGATIVE mg/dL
LEUKOCYTES UA: NEGATIVE
Nitrite: NEGATIVE
PH: 5 (ref 5.0–8.0)
Protein, ur: NEGATIVE mg/dL
Specific Gravity, Urine: 1.026 (ref 1.005–1.030)

## 2016-12-23 LAB — COMPREHENSIVE METABOLIC PANEL
ALT: 9 U/L — ABNORMAL LOW (ref 17–63)
AST: 21 U/L (ref 15–41)
Albumin: 3.9 g/dL (ref 3.5–5.0)
Alkaline Phosphatase: 96 U/L (ref 38–126)
Anion gap: 7 (ref 5–15)
BUN: 17 mg/dL (ref 6–20)
CHLORIDE: 101 mmol/L (ref 101–111)
CO2: 28 mmol/L (ref 22–32)
Calcium: 9 mg/dL (ref 8.9–10.3)
Creatinine, Ser: 1.37 mg/dL — ABNORMAL HIGH (ref 0.61–1.24)
GFR calc Af Amer: 54 mL/min — ABNORMAL LOW (ref >60)
GFR, EST NON AFRICAN AMERICAN: 47 mL/min — AB (ref >60)
Glucose, Bld: 121 mg/dL — ABNORMAL HIGH (ref 65–99)
POTASSIUM: 4 mmol/L (ref 3.5–5.1)
SODIUM: 136 mmol/L (ref 135–145)
Total Bilirubin: 0.9 mg/dL (ref 0.3–1.2)
Total Protein: 6.9 g/dL (ref 6.5–8.1)

## 2016-12-23 LAB — CBC
HEMATOCRIT: 42.8 % (ref 39.0–52.0)
Hemoglobin: 14 g/dL (ref 13.0–17.0)
MCH: 30 pg (ref 26.0–34.0)
MCHC: 32.7 g/dL (ref 30.0–36.0)
MCV: 91.6 fL (ref 78.0–100.0)
Platelets: 171 10*3/uL (ref 150–400)
RBC: 4.67 MIL/uL (ref 4.22–5.81)
RDW: 14.9 % (ref 11.5–15.5)
WBC: 7 10*3/uL (ref 4.0–10.5)

## 2016-12-23 LAB — LIPASE, BLOOD: LIPASE: 45 U/L (ref 11–51)

## 2016-12-23 LAB — POC OCCULT BLOOD, ED: Fecal Occult Bld: NEGATIVE

## 2016-12-23 MED ORDER — IOPAMIDOL (ISOVUE-300) INJECTION 61%
INTRAVENOUS | Status: AC
  Administered 2016-12-23: 100 mL
  Filled 2016-12-23: qty 100

## 2016-12-23 NOTE — ED Notes (Addendum)
Pt in imaging

## 2016-12-23 NOTE — ED Provider Notes (Signed)
[facility] EMERGENCY DEPARTMENT Provider Note   CSN: [id number] Arrival date & time: 12/23/16  1528     History   Chief Complaint Chief Complaint  Patient presents with  . Abdominal Pain    HPI [name] is a 83 y.o. male.  Complains of epigastric abdominal pain onset approximately 12 noon today.  Pain is nonradiating no associated nausea appetite change or shortness of breath.  It is mild to moderate at present.  Made worse with changing positions and improved with remaining still.  No treatment prior to coming here. he ate 2 meals today without nausea or vomiting.  Eating does not affect the pain.  Pain is not affected by walking or exertion.  No shortness of breath.  He states he had 3 bowel movements today which were small.  No blood per rectum no diarrhea.  Other associated symptoms include urinary hesitancy for the past 2 or 3 days.  No other associated symptoms.  He does feel that he is able to empty his bladder when he urinates patient was seen here 12/16/2016 for abdominal pain had CT angio the abdomen and pelvis which showed nonocclusive abdominal atherosclerosis and suspected atelectasis.  Patient states he has a lead loose to his AICD however chest x-ray from 12/13/2016 showed stable position of the device and its leads  HPI  Past Medical History:  Diagnosis Date  . Abnormal thyroid scan    Abnormal thyroid imaging studies from 11/09/2010, status post ultrasound guided fine needle aspiration of the dominant left inferior thyroid nodule on 12/15/2010. Cytology report showed rare follicular epithelial cells and hemosiderin laden macrophages.  Marland Kitchen AICD (automatic cardioverter/defibrillator) present    fx lead   . Arthritis    "all over"  . Asthma   . Atrial fibrillation ([place])    on chronic Coumadin; stopped July 2013 due to subdural hematomas  . CHF (congestive heart failure) (HCC)    EF 35-40% s/p most recent ICD generator change-out with Medtronic  dual-chamber ICD 05/20/11 with explantation of previous abdominally-implanted device  . Coronary artery disease    s/p CABG 1983 and PCI/stent 2004.   . Diabetes mellitus    diet controlled  . Dyslipidemia   . Erythrocytosis   . GERD (gastroesophageal reflux disease)   . Hypertension   . Ischemic cardiomyopathy    WITH CHF  . Monocytosis 04/17/2013  . Myocardial infarction ([place]) 1983; ~ 1990  . 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 Pneumonia August 2013  . Subdural hematoma [facility]) July 2013   Anticoagulation stopped.   . VT (ventricular tachycardia) ([place])   . Wears glasses     Patient Active Problem List   Diagnosis Date Noted  . Failure of implantable cardioverter-defibrillator lead 11/10/2016  . ICD (implantable cardioverter-defibrillator) in place 04/08/2016  . Gastroesophageal reflux disease without esophagitis 12/31/2013  . Hypotension 09/23/2011  . HCAP (healthcare-associated pneumonia) 09/23/2011  . SIRS (systemic inflammatory response syndrome) ([place]) 09/23/2011  . Thrombocytopenia ([place]) 09/23/2011  . History of subdural hemorrhage 09/08/2011  . Ataxia 08/17/2011  . Dehydration 08/17/2011  . Atrial fibrillation with controlled ventricular response ([place]) 08/13/2011  . Colon polyp 07/07/2011  . Angiodysplasia of colon 07/07/2011  . GERD (gastroesophageal reflux disease) 05/31/2011  . Anemia, iron deficiency 12/29/2010  . Chronic anticoagulation discontinued July 2013 after developed subdural hematoma 09/22/2010  . Bradycardia 09/22/2010  . CAD (coronary artery disease) 06/25/2010  . ventricular tachycardia 12/30/2009  . Chronic atrial fibrillation ([place]) 12/30/2009  . Chronic systolic  heart failure- EF 35-40% 12/30/2009  . Automatic implantable cardioverter-defibrillator in situ 12/30/2009    Past Surgical History:  Procedure Laterality Date  . CHOLECYSTECTOMY    . COLONOSCOPY  07/07/2011   Procedure: COLONOSCOPY;  Surgeon: Jerene Bears, MD;  Location: [facility];  Service:  Gastroenterology;  Laterality: N/A;  . CORONARY ANGIOPLASTY WITH STENT PLACEMENT  2004   Tandem Cypher stents LAD  . CORONARY ARTERY BYPASS GRAFT  1983   SVG-mLAD  . ESOPHAGOGASTRODUODENOSCOPY  02/11/2011   Procedure: ESOPHAGOGASTRODUODENOSCOPY (EGD);  Surgeon: Beryle Beams, MD;  Location: [facility];  Service: Endoscopy;  Laterality: N/A;  . ICD GENERATOR CHANGEOUT N/A 04/08/2016   Procedure: ICD Generator Changeout;  Surgeon: Evans Lance, MD;  Location: [facility] CV LAB;  Service: Cardiovascular;  Laterality: N/A;  . IMPLANTABLE CARDIOVERTER DEFIBRILLATOR (ICD) GENERATOR CHANGE N/A 05/20/2011   Procedure: ICD GENERATOR CHANGE;  Surgeon: Evans Lance, MD;  Medtronic secure dual-chamber ICD serial number [id number]   . KNEE ARTHROSCOPY     right; "just went in and scraped it"  . MASS EXCISION Right 05/10/2013   Procedure: EXCISION MASS RIGHT THUMB;  Surgeon: Wynonia Sours, MD;  Location: [facility];  Service: Orthopedics;  Laterality: Right;  . PROXIMAL INTERPHALANGEAL FUSION (PIP) Right 05/10/2013   Procedure: DEBRIDEMENT PROXIMAL INTERPHALANGEAL FUSION (PIP);  Surgeon: Wynonia Sours, MD;  Location: [facility];  Service: Orthopedics;  Laterality: Right;  . TONSILLECTOMY             Home Medications    Prior to Admission medications   Medication Sig Start Date End Date Taking? Authorizing Provider  ADVAIR DISKUS 250-50 MCG/DOSE AEPB Inhale 1 puff into the lungs 2 (two) times daily.  11/13/14   [provider]  aspirin 81 MG tablet Take 81-325 mg daily as needed by mouth for headache.    [provider]  calcium carbonate (TUMS - DOSED IN MG ELEMENTAL CALCIUM) 500 MG chewable tablet Chew 3 tablets by mouth daily as needed for heartburn.     [provider]  carvedilol (COREG) 12.5 MG tablet TAKE 1 TABLET (12.5 MG TOTAL) BY MOUTH 2 (TWO) TIMES DAILY. 12/05/16   Evans Lance, MD  diclofenac sodium (VOLTAREN) 1 % GEL Apply 2 g  topically 4 (four) times daily. As needed 08/05/16   [name], MD  fluticasone [facility]) 50 MCG/ACT nasal spray Place 2 sprays into the nose daily as needed for allergies.     [provider]  furosemide (LASIX) 40 MG tablet TAKE 1 TABLET (40 MG TOTAL) BY MOUTH DAILY. MAY TAKE AN EXTRA TAB DAILY AS NEEDED FOR SWELLING Patient taking differently: Take 40 mg daily as needed by mouth for edema.  01/18/16   [name], Peter M, MD  gabapentin (NEURONTIN) 100 MG capsule TAKE 1 CAPSULE (100MG ) TO 3 CAPSULES (300MG ) EACH NIGHT AS NEEDED FOR MUSCLE PAINS Patient taking differently: Take 200 mg at bedtime by mouth.  10/05/16   [name], MD  guaiFENesin (MUCINEX) 600 MG 12 hr tablet Take 600 mg 2 (two) times daily by mouth.    [provider]  LINZESS 72 MCG capsule Take 72 mcg by mouth daily. 03/07/16   [provider]  lisinopril (PRINIVIL,ZESTRIL) 10 MG tablet Take 10 mg by mouth daily.    [provider]  loratadine (CLARITIN) 10 MG tablet Take 10 mg by mouth daily.     [provider]  nitroGLYCERIN (NITROSTAT) 0.4 MG  SL tablet PLACE ONE UNDER TONGUE FOR CHEST PAIN. 11/22/16   [name], Peter M, MD  ondansetron (ZOFRAN) 8 MG tablet Take 1 tablet (8 mg total) every 8 (eight) hours as needed by mouth for nausea or vomiting. 12/06/16   [name], MD  polyethylene glycol [facility] Floria Raveling) packet Take 17 g by mouth daily. Patient taking differently: Take 17 g daily as needed by mouth for mild constipation.  04/14/16   Charlesetta Shanks, MD  potassium chloride (K-DUR,KLOR-CON) 10 MEQ tablet Take 1 tablet (10 mEq total) by mouth daily. 01/09/13   [name], Peter M, MD  ranitidine (ZANTAC) 150 MG tablet Take 1 tablet (150 mg total) 2 (two) times daily by mouth. 12/20/16   [name], MD  ranitidine (ZANTAC) 300 MG tablet Take 1 tablet (300 mg total) by mouth daily. Patient taking differently: Take 300 mg daily as needed by mouth for heartburn.  10/05/16   [name], MD  RESTASIS 0.05 % ophthalmic emulsion INSTILL 1 DROP INTO BOTH EYES TWICE A DAY 11/17/16   [name], MD  traMADol (ULTRAM) 50 MG tablet TAKE 2 TABLETS BY MOUTH EVERY 8 HOURS AS NEEDED Patient taking differently: TAKE 100mg  BY MOUTH EVERY 8 HOURS AS NEEDED 08/02/16   [name], MD    Family History Family History  Problem Relation Age of Onset  . Tuberculosis Mother   . Tuberculosis Father   . Heart disease Brother   . Diabetes Sister   . Diabetes Brother   . Clotting disorder Brother     Social History Social History   Tobacco Use  . Smoking status: Former Smoker    Packs/day: 2.00    Years: 30.00    Pack years: 60.00    Types: Cigarettes    Last attempt to quit: 06/23/1976    Years since quitting: 40.5  . Smokeless tobacco: Never Used  Substance Use Topics  . Alcohol use: No  . Drug use: No     Allergies   Celebrex [celecoxib]; Esomeprazole magnesium; Tamsulosin; Dronedarone; Digoxin; Hydrocodone-acetaminophen; and Protonix [pantoprazole sodium]   Review of Systems Review of Systems  Constitutional: Negative.   HENT: Negative.   Respiratory: Negative.   Cardiovascular: Positive for chest pain.       Syncope  Gastrointestinal: Positive for abdominal pain.  Genitourinary: Positive for dysuria.  Musculoskeletal: Negative.   Skin: Negative.   Allergic/Immunologic: Negative.   Neurological: Negative.   Psychiatric/Behavioral: Negative.   All other systems reviewed and are negative.    Physical Exam Updated Vital Signs BP 107/60 (BP Location: Right Arm)   Pulse 81   Temp 97.8 F (36.6 C) (Oral)   Resp 14   SpO2 98%   Physical Exam  Constitutional: He appears well-developed and well-nourished.  HENT:  Head: Normocephalic and atraumatic.  Eyes: Conjunctivae are normal. Pupils are equal, round, and reactive to light.  Neck: Neck supple. No tracheal deviation present. No thyromegaly present.  Cardiovascular: Normal rate.  No murmur  heard. Irregularly irregular  Pulmonary/Chest: Effort normal and breath sounds normal.  Abdominal: Soft. Bowel sounds are normal. He exhibits no distension and no mass. There is tenderness. There is no rebound and no guarding.  Mild diffuse tenderness  Genitourinary: Rectum normal, prostate normal and penis normal. Rectal exam shows guaiac negative stool.  Genitourinary Comments: Normal tone brown stool Hemoccult negative  Musculoskeletal: Normal range of motion. He exhibits no edema or tenderness.  Neurological: He is alert. Coordination normal.  Skin:  Skin is warm and dry. No rash noted.  Psychiatric: He has a normal mood and affect.  Nursing note and vitals reviewed.    ED Treatments / Results  Labs (all labs ordered are listed, but only abnormal results are displayed) Labs Reviewed  LIPASE, BLOOD  COMPREHENSIVE METABOLIC PANEL  CBC  URINALYSIS, ROUTINE W REFLEX MICROSCOPIC    EKG  EKG Interpretation None      ED ECG REPORT   Date: 12/23/2016  Rate: 70  Rhythm: atrial fibrillation  QRS Axis: normal  Intervals: normal  ST/T Wave abnormalities: nonspecific T wave changes  Conduction Disutrbances:none  Narrative Interpretation:   Old EKG Reviewed: Unchanged from 12/16/2016  I have personally reviewed the EKG tracing and disagree with the computerized printout as noted. Radiology No results found.  Procedures Procedures (including critical care time)  Medications Ordered in ED Medications - No data to display Chest x-ray viewed by me.  No evidence of broken  AICD lead Results for orders placed or performed during the hospital encounter of 12/23/16  Lipase, blood  Result Value Ref Range   Lipase 45 11 - 51 U/L  Comprehensive metabolic panel  Result Value Ref Range   Sodium 136 135 - 145 mmol/L   Potassium 4.0 3.5 - 5.1 mmol/L   Chloride 101 101 - 111 mmol/L   CO2 28 22 - 32 mmol/L   Glucose, Bld 121 (H) 65 - 99 mg/dL   BUN 17 6 - 20 mg/dL   Creatinine,  Ser 1.37 (H) 0.61 - 1.24 mg/dL   Calcium 9.0 8.9 - 10.3 mg/dL   Total Protein 6.9 6.5 - 8.1 g/dL   Albumin 3.9 3.5 - 5.0 g/dL   AST 21 15 - 41 U/L   ALT 9 (L) 17 - 63 U/L   Alkaline Phosphatase 96 38 - 126 U/L   Total Bilirubin 0.9 0.3 - 1.2 mg/dL   GFR calc non Af Amer 47 (L) >60 mL/min   GFR calc Af Amer 54 (L) >60 mL/min   Anion gap 7 5 - 15  CBC  Result Value Ref Range   WBC 7.0 4.0 - 10.5 K/uL   RBC 4.67 4.22 - 5.81 MIL/uL   Hemoglobin 14.0 13.0 - 17.0 g/dL   HCT 42.8 39.0 - 52.0 %   MCV 91.6 78.0 - 100.0 fL   MCH 30.0 26.0 - 34.0 pg   MCHC 32.7 30.0 - 36.0 g/dL   RDW 14.9 11.5 - 15.5 %   Platelets 171 150 - 400 K/uL  Urinalysis, Routine w reflex microscopic  Result Value Ref Range   Color, Urine YELLOW YELLOW   APPearance CLEAR CLEAR   Specific Gravity, Urine 1.026 1.005 - 1.030   pH 5.0 5.0 - 8.0   Glucose, UA NEGATIVE NEGATIVE mg/dL   Hgb urine dipstick NEGATIVE NEGATIVE   Bilirubin Urine NEGATIVE NEGATIVE   Ketones, ur NEGATIVE NEGATIVE mg/dL   Protein, ur NEGATIVE NEGATIVE mg/dL   Nitrite NEGATIVE NEGATIVE   Leukocytes, UA NEGATIVE NEGATIVE  POC occult blood, ED  Result Value Ref Range   Fecal Occult Bld NEGATIVE NEGATIVE  I-stat troponin, ED  Result Value Ref Range   Troponin i, poc 0.00 0.00 - 0.08 ng/mL   Comment 3           Dg Chest 2 View  Result Date: 12/23/2016 CLINICAL DATA:  Epigastric abdominal pain. EXAM: CHEST  2 VIEW COMPARISON:  Radiographs of December 13, 2016. FINDINGS: Stable cardiomediastinal silhouette. Atherosclerosis of thoracic aorta  is noted. Left-sided pacemaker and leads are unchanged in position. No pneumothorax or pleural effusion is noted. No acute pulmonary disease is noted. Bony thorax is unremarkable. IMPRESSION: No active cardiopulmonary disease.  Aortic atherosclerosis. Electronically Signed   By: Marijo Conception, M.D.   On: 12/23/2016 16:50   Dg Chest 2 View  Result Date: 12/13/2016 CLINICAL DATA:  Preop to from earlier  lead removal.  Cough. EXAM: CHEST  2 VIEW COMPARISON:  Radiographs of November 10, 2016. FINDINGS: Stable cardiomediastinal silhouette. Atherosclerosis of thoracic aorta is noted. Stable position of left-sided pacemaker and its leads. Sternotomy wires are noted. No pneumothorax is noted. Stable minimal bilateral pleural effusions are noted. Minimal bibasilar subsegmental atelectasis or scarring is noted. Bony thorax is unremarkable. IMPRESSION: Stable minimal bibasilar subsegmental atelectasis or scarring. Stable minimal bilateral pleural effusions. Aortic atherosclerosis. Electronically Signed   By: Marijo Conception, M.D.   On: 12/13/2016 15:11   Ct Abdomen Pelvis W Contrast  Result Date: 12/23/2016 CLINICAL DATA:  83 year old male with epigastric pain and diarrhea for 1 week. EXAM: CT ABDOMEN AND PELVIS WITH CONTRAST TECHNIQUE: Multidetector CT imaging of the abdomen and pelvis was performed using the standard protocol following bolus administration of intravenous contrast. CONTRAST:  163mL ISOVUE-300 IOPAMIDOL (ISOVUE-300) INJECTION 61% COMPARISON:  12/16/2016 and 11/08/2015. FINDINGS: Lower chest: Stable, rounded masslike opacity in the medial right lower lobe similar to prior studies and again suggestive of rounded atelectasis. Pacer wires again noted. No acute changes in the lower chest or mediastinum. Hepatobiliary: No focal liver abnormality is seen. Status post cholecystectomy. No biliary dilatation. Pancreas: Unremarkable. No pancreatic ductal dilatation or surrounding inflammatory changes. Spleen: Normal in size without focal abnormality. Chronic calcifications appear unchanged. Adrenals/Urinary Tract: Adrenal glands are unremarkable. Kidneys are normal, without obstructing calculi, focal lesion, or hydronephrosis. 6-7 mm nonobstructing calculi noted in the bilateral lower poles. Bladder is unremarkable. Stomach/Bowel: Stomach is within normal limits. Appendix appears normal. No evidence of bowel wall  thickening, distention, or inflammatory changes. Note is made of marked sigmoid diverticulosis. Vascular/Lymphatic: Aortic atherosclerosis. No enlarged abdominal or pelvic lymph nodes. Reproductive: Prostate is unremarkable. Other: No abdominal wall hernia or abnormality. No abdominopelvic ascites. Musculoskeletal: No acute or significant osseous findings. IMPRESSION: 1. No CT evidence for acute intra-abdominal pathology to explain the patient's clinical symptoms. 2. Sigmoid diverticulosis without evidence for diverticulitis. 3. Stable bilateral nonobstructing nephrolithiasis. 4. Rounded masslike opacity in the right lower lobe suggesting rounded atelectasis, stable from prior exams. Electronically Signed   By: Kristopher Oppenheim M.D.   On: 12/23/2016 17:21   Ct Angio Abd/pel W And/or Wo Contrast  Result Date: 12/16/2016 CLINICAL DATA:  Mid abdominal pain, diarrhea and constipation for 2 weeks, concern for mesenteric ischemia EXAM: CT ANGIOGRAPHY ABDOMEN AND PELVIS WITH CONTRAST AND WITHOUT CONTRAST TECHNIQUE: Multidetector CT imaging of the abdomen and pelvis was performed using the standard protocol during bolus administration of intravenous contrast. Multiplanar reconstructed images and MIPs were obtained and reviewed to evaluate the vascular anatomy. CONTRAST:  124mL ISOVUE-370 IOPAMIDOL (ISOVUE-370) INJECTION 76% COMPARISON:  10/01/2016 FINDINGS: VASCULAR Aorta: Moderate aortic atherosclerosis without significant aneurysm, dissection, or occlusive process. No retroperitoneal abnormality, retroperitoneal hematoma, or evidence of rupture. Celiac: Minor atherosclerosis.  Widely patent including its branches SMA: Minor atherosclerosis.  Widely patent including its branches. Renals: Atherosclerotic origins but widely patent. Patent accessory renal artery to the right kidney lower pole. IMA: Atherosclerotic origin but remains patent off of the distal aorta. Inflow: Mild iliac ectasia without occlusion, dissection,  aneurysm,  or stenosis. Common, internal and external iliac arteries remain patent bilaterally. No inflow disease. Proximal Outflow: Common femoral, proximal profunda femoral, proximal superficial femoral arteries demonstrated all remain patent. Veins: No obvious venous abnormality within the limitations of this arterial phase study. Review of the MIP images confirms the above findings. NON-VASCULAR Lower chest: Chronic rounded masslike opacity in the medial right lower lobe, suspect rounded atelectasis. Postop changes in the left lower chest. Remote median sternotomy changes noted. Pacer wires in the right heart. Small right pleural effusion. Minor streaky atelectasis in the left lower lobe. Hepatobiliary: Remote cholecystectomy. No biliary dilatation. No focal hepatic abnormality or biliary dilatation. Hepatic and portal veins remain patent. Pancreas: Unremarkable. No pancreatic ductal dilatation or surrounding inflammatory changes. Spleen: Chronic splenic calcifications centrally. No acute splenic abnormality. No significant interval change. Adrenals/Urinary Tract: Normal adrenal glands. Nonobstructing lower pole intrarenal calculi bilaterally. Mild renal cortical scarring. Small 7 mm right kidney lower pole cyst noted, image 39. No acute hydronephrosis, perinephric inflammatory process, hydroureter, or ureteral calculus. No bladder abnormality. Stomach/Bowel: Negative for bowel obstruction, significant dilatation, ileus, or free air. Normal appendix demonstrated. No fluid collection or abscess. Minor scattered colonic diverticulosis without acute inflammatory process. Lymphatic: No adenopathy. Reproductive: Prostate is unremarkable. Other: No inguinal abnormality or hernia. Postop changes in the left abdominal wall with pacer wires noted. Intact abdominal wall. No ventral hernia. Musculoskeletal: Advanced degenerative changes of the spine and lower lumbar facet arthropathy. No acute compression fracture.  IMPRESSION: VASCULAR Nonocclusive abdominal atherosclerosis. Patent mesenteric and renal vasculature. Negative for mesenteric or renal vascular occlusive disease. No significant aortoiliac occlusive process, aneurysm or acute vascular finding. NON-VASCULAR Chronic rounded masslike opacity in the right lower lobe, suspect rounded atelectasis. Associated chronic right pleural effusion. Bibasilar atelectasis/ scarring Remote cholecystectomy Bilateral nonobstructing nephrolithiasis Diverticulosis without acute inflammatory process Electronically Signed   By: Jerilynn Mages.  Shick M.D.   On: 12/16/2016 14:44   Initial Impression / Assessment and Plan / ED Course  I have reviewed the triage vital signs and the nursing notes.  Pertinent labs & imaging results that were available during my care of the patient were reviewed by me and considered in my medical decision making (see chart for details).     8:40 PM patient only complains of mild epigastric discomfort and feels much improved without treatment while here. Renal insufficiency is chronic Patient reports he has a scheduled follow-up appointment Dr. Lovena Le, his cardiologist for next week.  Plan Zantac as directed.  Follow-up with PMD as needed.  I strongly doubt anginal equivalent.  Symptoms constant, nonacute EKG, negative troponin.  Symptoms nonexertional Final Clinical Impressions(s) / ED Diagnoses  Diagnosis epigastric pain Final diagnoses:  None    ED Discharge Orders    None       Orlie Dakin, MD 12/23/16 2049

## 2016-12-23 NOTE — Telephone Encounter (Signed)
[name] called stating that his pacemaker wire had moved and something needed to be done.  Upon further questioning, [name] is having active chest pain.  He seems a bit confused.  He has not taken any medications for it.    I reviewed the 11/13 chest x-ray and his pacemaker wire was not displaced.   I explained to him why I did not think it was his pacemaker wire and suggested that he come to the emergency room by EMS.  The patient requested that I call EMS for him. I spoke with the 911 operator and EMS was dispatched.  Rosaria Ferries, PA-C 12/23/2016 2:41 PM [facility] [phone]

## 2016-12-23 NOTE — ED Triage Notes (Signed)
Per [facility],  Pt from home. Pt complaining of abdominal cramping and diarrhea x 1 week. Pt reports being seen here a few days ago with no significant findings. Pt reports sudden onset of epigastric pain today. Pt reports pain is dull and aching, denies nausea/vomiting. Pt reports he has a pacemaker with a wire lose that was supposed to be replaced this week but has not due to pt not feeling well. Pt alert and oriented, in NAD.

## 2016-12-23 NOTE — ED Notes (Signed)
Pt given permission to eat/drink. He chose a [place] sandwich as well as a regular coke. Pt appears comfortable.

## 2016-12-23 NOTE — Discharge Instructions (Signed)
Take Zantac as directed.  Keep your scheduled appointment with Dr. Lovena Le next week.  If abdominal discomfort continue by next week, call Dr. Sarajane Jews to schedule an office visit

## 2016-12-26 ENCOUNTER — Other Ambulatory Visit: Payer: Self-pay | Admitting: Cardiology

## 2016-12-27 ENCOUNTER — Telehealth: Payer: Self-pay | Admitting: Family Medicine

## 2016-12-27 NOTE — Telephone Encounter (Signed)
He should make an OV for [place] to discuss this

## 2016-12-27 NOTE — Telephone Encounter (Signed)
Copied from [place]. Topic: Inquiry >> Dec 27, 2016 10:50 AM Boyd Kerbs wrote: Reason for CRM:  Patient wants to Speak to Dr. Sarajane Jews Only.  Michela Pitcher it is a personal issue and does not want it to get out.

## 2016-12-27 NOTE — Telephone Encounter (Signed)
Sent to PCP would you like the pt to set up an OV for this personal concern?

## 2016-12-28 ENCOUNTER — Ambulatory Visit: Payer: Medicare Other

## 2016-12-28 NOTE — Telephone Encounter (Signed)
Called pt he does have an appt on 12/5 could he hold off on these concerns until then and address them at that time. Is this ok ? I spoke with pt and it really sound like he has a lot going on so I don't know if you'll have anough time to address everything on 12/5 for the 3 month f/u appt.

## 2017-01-02 ENCOUNTER — Telehealth: Payer: Self-pay | Admitting: Family Medicine

## 2017-01-02 NOTE — Telephone Encounter (Signed)
Copied from [place] [phone]. Topic: Quick Communication - Rx Refill/Question >> Jan 02, 2017 10:49 AM Oneta Rack wrote: Relation to BP:[facility] Call back number:[phone] Pharmacy: [id number] - , [place]  Reason for call:  Patient requesting furosemide (LASIX) 40 MG tablet, please advise

## 2017-01-03 NOTE — Telephone Encounter (Signed)
Request  For  Lasix  Refill .  The  Rx  Was  Written by  Peter  [name] a  Cardiologist

## 2017-01-03 NOTE — Telephone Encounter (Signed)
Sent to PCP for approval.  

## 2017-01-04 ENCOUNTER — Encounter: Payer: Self-pay | Admitting: Family Medicine

## 2017-01-04 ENCOUNTER — Ambulatory Visit (INDEPENDENT_AMBULATORY_CARE_PROVIDER_SITE_OTHER): Payer: Medicare Other | Admitting: Family Medicine

## 2017-01-04 ENCOUNTER — Telehealth: Payer: Self-pay

## 2017-01-04 VITALS — BP 110/60 | HR 63 | Temp 98.0°F | Wt 191.1 lb

## 2017-01-04 DIAGNOSIS — I5022 Chronic systolic (congestive) heart failure: Secondary | ICD-10-CM

## 2017-01-04 DIAGNOSIS — K219 Gastro-esophageal reflux disease without esophagitis: Secondary | ICD-10-CM | POA: Diagnosis not present

## 2017-01-04 DIAGNOSIS — I482 Chronic atrial fibrillation, unspecified: Secondary | ICD-10-CM

## 2017-01-04 DIAGNOSIS — I2510 Atherosclerotic heart disease of native coronary artery without angina pectoris: Secondary | ICD-10-CM | POA: Diagnosis not present

## 2017-01-04 MED ORDER — FLUTICASONE PROPIONATE 50 MCG/ACT NA SUSP: 2.0000 | Freq: Every day | NASAL | 11 refills | Status: DC

## 2017-01-04 MED ORDER — FUROSEMIDE 40 MG PO TABS: 40.0000 mg | ORAL_TABLET | Freq: Every day | ORAL | 3 refills | Status: DC

## 2017-01-04 NOTE — Telephone Encounter (Signed)
CVM left for Pt.  Per review of previous medical notes, Pt believes he has an appt with Dr. Lovena Le on Dec 10. Left message for Pt notifying him he does not have appt on Dec 10, this is a remote home check for his device.  Notified Pt he does not need to see Dr. Lovena Le, we just need to reschedule his procedure.   Tentative date for reschedule would be Dec 20.  Asked Pt to call this nurse back and indicate if that day would work. This nurse name and # left for call back.

## 2017-01-04 NOTE — Telephone Encounter (Signed)
This was done.

## 2017-01-04 NOTE — Progress Notes (Signed)
   Subjective:    Patient ID: [name], male    DOB: 1932/02/04, 83 y.o.   MRN: [id number]  HPI Here to follow up after an ER visit on 12-23-16 for epigastric pain. His workup then, including labs and EKG, was normal and it was felt he had GERD. They recommended he try Zantac and he has been using this as needed. This works well and he has had no further pain.    Review of Systems  Constitutional: Negative.   Respiratory: Negative.   Cardiovascular: Negative.   Gastrointestinal: Negative.   Neurological: Negative.        Objective:   Physical Exam  Constitutional: He appears well-developed and well-nourished.  Neck: No thyromegaly present.  Cardiovascular: Normal rate, regular rhythm, normal heart sounds and intact distal pulses.  Pulmonary/Chest: Effort normal and breath sounds normal. No respiratory distress. He has no rales.  Abdominal: Soft. Bowel sounds are normal. He exhibits no distension and no mass. There is no tenderness. There is no rebound and no guarding.  Lymphadenopathy:    He has no cervical adenopathy.          Assessment & Plan:  His HTN is stable. He has had some GERD symptoms which are well controlled with prn Zantac. He will follow up with Cardiology next week and he is waiting to see if the loose wire in his ICD will be replaced.  Alysia Penna, MD

## 2017-01-09 ENCOUNTER — Ambulatory Visit (INDEPENDENT_AMBULATORY_CARE_PROVIDER_SITE_OTHER): Payer: Medicare Other | Admitting: *Deleted

## 2017-01-09 DIAGNOSIS — I4729 Other ventricular tachycardia: Secondary | ICD-10-CM

## 2017-01-09 DIAGNOSIS — I472 Ventricular tachycardia: Secondary | ICD-10-CM

## 2017-01-09 NOTE — Progress Notes (Signed)
Remote ICD transmission.   

## 2017-01-12 LAB — CUP PACEART REMOTE DEVICE CHECK
Battery Remaining Longevity: 134 mo
Date Time Interrogation Session: 20181210094224
HIGH POWER IMPEDANCE MEASURED VALUE: 42 Ohm
HighPow Impedance: 50 Ohm
Implantable Lead Implant Date: 20130419
Implantable Lead Model: 6947
Implantable Pulse Generator Implant Date: 20180309
Lead Channel Impedance Value: 4047 Ohm
Lead Channel Impedance Value: 4047 Ohm
Lead Channel Sensing Intrinsic Amplitude: 7.875 mV
Lead Channel Setting Pacing Pulse Width: 0.4 ms
Lead Channel Setting Sensing Sensitivity: 0.3 mV
MDC IDC LEAD IMPLANT DT: 20130419
MDC IDC LEAD LOCATION: 753859
MDC IDC LEAD LOCATION: 753860
MDC IDC MSMT BATTERY VOLTAGE: 3.04 V
MDC IDC MSMT LEADCHNL RV PACING THRESHOLD AMPLITUDE: 0.25 V
MDC IDC MSMT LEADCHNL RV PACING THRESHOLD PULSEWIDTH: 0.4 ms
MDC IDC MSMT LEADCHNL RV SENSING INTR AMPL: 7.875 mV
MDC IDC SET LEADCHNL RV PACING AMPLITUDE: 2.5 V
MDC IDC STAT BRADY RV PERCENT PACED: 3.07 %

## 2017-01-13 ENCOUNTER — Encounter: Payer: Self-pay | Admitting: Cardiology

## 2017-01-16 NOTE — Telephone Encounter (Signed)
Spoke with [name]  Notified [name] that Dr. Lovena Le would like to reschedule lead removal and replacement.  Per [name] any day in January would be ok.  Sent staff message to begin rescheduling process.  Notified [name] would get back with him.

## 2017-01-19 DIAGNOSIS — Z9229 Personal history of other drug therapy: Secondary | ICD-10-CM | POA: Diagnosis not present

## 2017-01-19 DIAGNOSIS — M1711 Unilateral primary osteoarthritis, right knee: Secondary | ICD-10-CM | POA: Diagnosis not present

## 2017-01-19 DIAGNOSIS — M21161 Varus deformity, not elsewhere classified, right knee: Secondary | ICD-10-CM | POA: Diagnosis not present

## 2017-01-19 DIAGNOSIS — M25561 Pain in right knee: Secondary | ICD-10-CM | POA: Diagnosis not present

## 2017-01-27 ENCOUNTER — Telehealth: Payer: Self-pay | Admitting: Internal Medicine

## 2017-01-27 NOTE — Telephone Encounter (Signed)
New message     Patient calling with concerns of discomfort near incision site. No CP.  Not SOB. Please call

## 2017-01-27 NOTE — Telephone Encounter (Signed)
Returned call to Pt.  Pt with chest discomfort that he associates with the broken lead from his device.  Pt states he can sometimes move and make it go away.  Asked Pt to continue to monitor over the weekend, if pain quality changes he should go to ER.  Will touch base with Pt on Monday and formulate a plan from there.  Pt indicates understanding.  Will cont to monitor.

## 2017-02-06 ENCOUNTER — Other Ambulatory Visit: Payer: Self-pay

## 2017-02-06 ENCOUNTER — Emergency Department (HOSPITAL_COMMUNITY): Payer: Medicare Other

## 2017-02-06 ENCOUNTER — Encounter (HOSPITAL_COMMUNITY): Payer: Self-pay

## 2017-02-06 ENCOUNTER — Inpatient Hospital Stay (HOSPITAL_COMMUNITY)
Admission: EM | Admit: 2017-02-06 | Discharge: 2017-02-10 | DRG: 394 | Disposition: A | Discharge status: Home Health | Payer: Medicare Other | Attending: Internal Medicine | Admitting: Internal Medicine

## 2017-02-06 DIAGNOSIS — K219 Gastro-esophageal reflux disease without esophagitis: Secondary | ICD-10-CM | POA: Diagnosis present

## 2017-02-06 DIAGNOSIS — I4891 Unspecified atrial fibrillation: Secondary | ICD-10-CM | POA: Diagnosis not present

## 2017-02-06 DIAGNOSIS — Z955 Presence of coronary angioplasty implant and graft: Secondary | ICD-10-CM | POA: Diagnosis not present

## 2017-02-06 DIAGNOSIS — K6289 Other specified diseases of anus and rectum: Secondary | ICD-10-CM

## 2017-02-06 DIAGNOSIS — Z7951 Long term (current) use of inhaled steroids: Secondary | ICD-10-CM

## 2017-02-06 DIAGNOSIS — R935 Abnormal findings on diagnostic imaging of other abdominal regions, including retroperitoneum: Secondary | ICD-10-CM | POA: Diagnosis not present

## 2017-02-06 DIAGNOSIS — D696 Thrombocytopenia, unspecified: Secondary | ICD-10-CM | POA: Diagnosis present

## 2017-02-06 DIAGNOSIS — N390 Urinary tract infection, site not specified: Secondary | ICD-10-CM | POA: Diagnosis present

## 2017-02-06 DIAGNOSIS — E1122 Type 2 diabetes mellitus with diabetic chronic kidney disease: Secondary | ICD-10-CM | POA: Diagnosis present

## 2017-02-06 DIAGNOSIS — J45909 Unspecified asthma, uncomplicated: Secondary | ICD-10-CM | POA: Diagnosis present

## 2017-02-06 DIAGNOSIS — E785 Hyperlipidemia, unspecified: Secondary | ICD-10-CM | POA: Diagnosis present

## 2017-02-06 DIAGNOSIS — R109 Unspecified abdominal pain: Secondary | ICD-10-CM | POA: Diagnosis not present

## 2017-02-06 DIAGNOSIS — R1084 Generalized abdominal pain: Secondary | ICD-10-CM | POA: Diagnosis not present

## 2017-02-06 DIAGNOSIS — K627 Radiation proctitis: Secondary | ICD-10-CM | POA: Insufficient documentation

## 2017-02-06 DIAGNOSIS — G8929 Other chronic pain: Secondary | ICD-10-CM | POA: Diagnosis present

## 2017-02-06 DIAGNOSIS — K625 Hemorrhage of anus and rectum: Secondary | ICD-10-CM

## 2017-02-06 DIAGNOSIS — R531 Weakness: Secondary | ICD-10-CM | POA: Diagnosis not present

## 2017-02-06 DIAGNOSIS — K626 Ulcer of anus and rectum: Secondary | ICD-10-CM | POA: Diagnosis not present

## 2017-02-06 DIAGNOSIS — Z951 Presence of aortocoronary bypass graft: Secondary | ICD-10-CM

## 2017-02-06 DIAGNOSIS — I2510 Atherosclerotic heart disease of native coronary artery without angina pectoris: Secondary | ICD-10-CM | POA: Diagnosis present

## 2017-02-06 DIAGNOSIS — I959 Hypotension, unspecified: Secondary | ICD-10-CM | POA: Diagnosis present

## 2017-02-06 DIAGNOSIS — I482 Chronic atrial fibrillation, unspecified: Secondary | ICD-10-CM

## 2017-02-06 DIAGNOSIS — T66XXXA Radiation sickness, unspecified, initial encounter: Secondary | ICD-10-CM

## 2017-02-06 DIAGNOSIS — Z833 Family history of diabetes mellitus: Secondary | ICD-10-CM

## 2017-02-06 DIAGNOSIS — Z95810 Presence of automatic (implantable) cardiac defibrillator: Secondary | ICD-10-CM

## 2017-02-06 DIAGNOSIS — I130 Hypertensive heart and chronic kidney disease with heart failure and stage 1 through stage 4 chronic kidney disease, or unspecified chronic kidney disease: Secondary | ICD-10-CM | POA: Diagnosis present

## 2017-02-06 DIAGNOSIS — R1013 Epigastric pain: Secondary | ICD-10-CM | POA: Diagnosis not present

## 2017-02-06 DIAGNOSIS — Z7901 Long term (current) use of anticoagulants: Secondary | ICD-10-CM

## 2017-02-06 DIAGNOSIS — N183 Chronic kidney disease, stage 3 (moderate): Secondary | ICD-10-CM | POA: Diagnosis present

## 2017-02-06 DIAGNOSIS — I5022 Chronic systolic (congestive) heart failure: Secondary | ICD-10-CM

## 2017-02-06 DIAGNOSIS — I252 Old myocardial infarction: Secondary | ICD-10-CM

## 2017-02-06 DIAGNOSIS — Z7982 Long term (current) use of aspirin: Secondary | ICD-10-CM | POA: Diagnosis not present

## 2017-02-06 DIAGNOSIS — R197 Diarrhea, unspecified: Secondary | ICD-10-CM | POA: Diagnosis not present

## 2017-02-06 DIAGNOSIS — Z87891 Personal history of nicotine dependence: Secondary | ICD-10-CM

## 2017-02-06 DIAGNOSIS — I255 Ischemic cardiomyopathy: Secondary | ICD-10-CM | POA: Diagnosis present

## 2017-02-06 DIAGNOSIS — Z8249 Family history of ischemic heart disease and other diseases of the circulatory system: Secondary | ICD-10-CM

## 2017-02-06 DIAGNOSIS — R195 Other fecal abnormalities: Secondary | ICD-10-CM | POA: Diagnosis not present

## 2017-02-06 LAB — CBC WITH DIFFERENTIAL/PLATELET
Basophils Absolute: 0 10*3/uL (ref 0.0–0.1)
Basophils Relative: 0 %
EOS ABS: 0.1 10*3/uL (ref 0.0–0.7)
EOS PCT: 1 %
HCT: 41.8 % (ref 39.0–52.0)
Hemoglobin: 13.9 g/dL (ref 13.0–17.0)
LYMPHS ABS: 1.8 10*3/uL (ref 0.7–4.0)
Lymphocytes Relative: 22 %
MCH: 29.5 pg (ref 26.0–34.0)
MCHC: 33.3 g/dL (ref 30.0–36.0)
MCV: 88.7 fL (ref 78.0–100.0)
MONO ABS: 2.4 10*3/uL — AB (ref 0.1–1.0)
MONOS PCT: 30 %
Neutro Abs: 3.9 10*3/uL (ref 1.7–7.7)
Neutrophils Relative %: 47 %
PLATELETS: 143 10*3/uL — AB (ref 150–400)
RBC: 4.71 MIL/uL (ref 4.22–5.81)
RDW: 14.9 % (ref 11.5–15.5)
WBC: 8.2 10*3/uL (ref 4.0–10.5)

## 2017-02-06 LAB — URINALYSIS, ROUTINE W REFLEX MICROSCOPIC
Bilirubin Urine: NEGATIVE
Glucose, UA: NEGATIVE mg/dL
KETONES UR: 20 mg/dL — AB
Nitrite: NEGATIVE
PH: 5 (ref 5.0–8.0)
Protein, ur: 30 mg/dL — AB
SPECIFIC GRAVITY, URINE: 1.018 (ref 1.005–1.030)

## 2017-02-06 LAB — COMPREHENSIVE METABOLIC PANEL
ALBUMIN: 4.3 g/dL (ref 3.5–5.0)
ALK PHOS: 73 U/L (ref 38–126)
ALT: 14 U/L — AB (ref 17–63)
AST: 36 U/L (ref 15–41)
Anion gap: 11 (ref 5–15)
BUN: 25 mg/dL — ABNORMAL HIGH (ref 6–20)
CALCIUM: 9.6 mg/dL (ref 8.9–10.3)
CO2: 24 mmol/L (ref 22–32)
CREATININE: 1.38 mg/dL — AB (ref 0.61–1.24)
Chloride: 102 mmol/L (ref 101–111)
GFR calc Af Amer: 53 mL/min — ABNORMAL LOW (ref >60)
GFR calc non Af Amer: 46 mL/min — ABNORMAL LOW (ref >60)
GLUCOSE: 107 mg/dL — AB (ref 65–99)
Potassium: 4.4 mmol/L (ref 3.5–5.1)
Sodium: 137 mmol/L (ref 135–145)
Total Bilirubin: 2.1 mg/dL — ABNORMAL HIGH (ref 0.3–1.2)
Total Protein: 7.3 g/dL (ref 6.5–8.1)

## 2017-02-06 LAB — TYPE AND SCREEN
ABO/RH(D): O POS
ANTIBODY SCREEN: NEGATIVE

## 2017-02-06 LAB — POC OCCULT BLOOD, ED: Fecal Occult Bld: POSITIVE — AB

## 2017-02-06 LAB — LIPASE, BLOOD: Lipase: 27 U/L (ref 11–51)

## 2017-02-06 LAB — ABO/RH: ABO/RH(D): O POS

## 2017-02-06 MED ORDER — DILTIAZEM HCL 25 MG/5ML IV SOLN
10.0000 mg | Freq: Once | INTRAVENOUS | Status: AC
  Administered 2017-02-06: 10 mg via INTRAVENOUS
  Filled 2017-02-06: qty 5

## 2017-02-06 MED ORDER — GI COCKTAIL ~~LOC~~
30.0000 mL | Freq: Once | ORAL | Status: AC
  Administered 2017-02-06: 30 mL via ORAL
  Filled 2017-02-06: qty 30

## 2017-02-06 MED ORDER — METOPROLOL TARTRATE 5 MG/5ML IV SOLN: 5.0000 mg | Freq: Four times a day (QID) | INTRAVENOUS | Status: DC | PRN

## 2017-02-06 MED ORDER — CYCLOSPORINE 0.05 % OP EMUL
1.0000 [drp] | Freq: Two times a day (BID) | OPHTHALMIC | Status: DC
  Administered 2017-02-07 – 2017-02-10 (×8): 1 [drp] via OPHTHALMIC
  Filled 2017-02-06 (×8): qty 1

## 2017-02-06 MED ORDER — CALCIUM CARBONATE ANTACID 500 MG PO CHEW: 3.0000 | CHEWABLE_TABLET | Freq: Every day | ORAL | Status: DC | PRN

## 2017-02-06 MED ORDER — MOMETASONE FURO-FORMOTEROL FUM 200-5 MCG/ACT IN AERO
2.0000 | INHALATION_SPRAY | Freq: Two times a day (BID) | RESPIRATORY_TRACT | Status: DC
  Administered 2017-02-07 – 2017-02-10 (×6): 2 via RESPIRATORY_TRACT
  Filled 2017-02-06: qty 8.8

## 2017-02-06 MED ORDER — TRAMADOL HCL 50 MG PO TABS
50.0000 mg | ORAL_TABLET | Freq: Four times a day (QID) | ORAL | Status: DC | PRN
  Administered 2017-02-07 – 2017-02-10 (×3): 50 mg via ORAL
  Filled 2017-02-06 (×3): qty 1

## 2017-02-06 MED ORDER — LISINOPRIL 10 MG PO TABS
10.0000 mg | ORAL_TABLET | Freq: Every day | ORAL | Status: DC
  Administered 2017-02-07 – 2017-02-08 (×2): 10 mg via ORAL
  Filled 2017-02-06 (×3): qty 1

## 2017-02-06 MED ORDER — FAMOTIDINE IN NACL 20-0.9 MG/50ML-% IV SOLN
20.0000 mg | Freq: Once | INTRAVENOUS | Status: AC
  Administered 2017-02-06: 20 mg via INTRAVENOUS
  Filled 2017-02-06: qty 50

## 2017-02-06 MED ORDER — ASPIRIN EC 325 MG PO TBEC: 325.0000 mg | DELAYED_RELEASE_TABLET | Freq: Every day | ORAL | Status: DC | PRN

## 2017-02-06 MED ORDER — SODIUM CHLORIDE 0.9 % IV BOLUS (SEPSIS)
500.0000 mL | Freq: Once | INTRAVENOUS | Status: AC
  Administered 2017-02-06: 500 mL via INTRAVENOUS

## 2017-02-06 MED ORDER — IOPAMIDOL (ISOVUE-300) INJECTION 61%
INTRAVENOUS | Status: AC
  Administered 2017-02-06: 100 mL via INTRAVENOUS
  Filled 2017-02-06: qty 100

## 2017-02-06 MED ORDER — FAMOTIDINE 20 MG PO TABS
20.0000 mg | ORAL_TABLET | Freq: Every day | ORAL | Status: DC
  Administered 2017-02-07 – 2017-02-10 (×4): 20 mg via ORAL
  Filled 2017-02-06 (×4): qty 1

## 2017-02-06 MED ORDER — DEXTROSE 5 % IV SOLN
2.0000 g | Freq: Every day | INTRAVENOUS | Status: DC
  Administered 2017-02-07 – 2017-02-08 (×3): 2 g via INTRAVENOUS
  Filled 2017-02-06 (×5): qty 2

## 2017-02-06 MED ORDER — CARVEDILOL 12.5 MG PO TABS
12.5000 mg | ORAL_TABLET | Freq: Two times a day (BID) | ORAL | Status: DC
  Administered 2017-02-07 (×2): 12.5 mg via ORAL
  Filled 2017-02-06 (×2): qty 1

## 2017-02-06 MED ORDER — FUROSEMIDE 40 MG PO TABS
40.0000 mg | ORAL_TABLET | Freq: Every day | ORAL | Status: DC
  Administered 2017-02-07 – 2017-02-09 (×3): 40 mg via ORAL
  Filled 2017-02-06 (×3): qty 1

## 2017-02-06 MED ORDER — FLUTICASONE PROPIONATE 50 MCG/ACT NA SUSP
2.0000 | Freq: Every day | NASAL | Status: DC
  Administered 2017-02-07 – 2017-02-10 (×4): 2 via NASAL
  Filled 2017-02-06: qty 16

## 2017-02-06 MED ORDER — GABAPENTIN 100 MG PO CAPS
200.0000 mg | ORAL_CAPSULE | Freq: Every day | ORAL | Status: DC
  Administered 2017-02-07 – 2017-02-09 (×4): 200 mg via ORAL
  Filled 2017-02-06 (×5): qty 2

## 2017-02-06 NOTE — ED Provider Notes (Signed)
[facility] DEPT Provider Note   CSN: [id number] Arrival date & time: 02/06/17  0930     History   Chief Complaint Chief Complaint  Patient presents with  . Abdominal Pain    HPI [name] is a 84 y.o. male.  84 year old male with a history of Afib, AICD, CHF, CAD, diabetes, dyslipidemia, and GERD presents with complaint of abdominal pain.  Patient reports that his pain is similar to his prior reflux pain, however, symptoms are worse today and have not resolved with medications at home.  Patient denies associated nausea or vomiting.  Patient denies fever.  Patient reports that his pain is constant for the last 2-3 days.  Patient reports some loose stool earlier today.  Patient denies a history of hemorrhoids.  Patient noticed some small amount of blood with wiping after the diarrhea.  Patient denied any significant amount of blood in the stool.   The history is provided by the patient.  Abdominal Pain   This is a new problem. The current episode started 2 days ago. The problem occurs constantly. The problem has not changed since onset.The pain is located in the epigastric region. The quality of the pain is aching. The pain is mild. Associated symptoms include diarrhea. Pertinent negatives include fever. Nothing aggravates the symptoms. Nothing relieves the symptoms. His past medical history is significant for GERD.    Past Medical History:  Diagnosis Date  . Abnormal thyroid scan    Abnormal thyroid imaging studies from 11/09/2010, status post ultrasound guided fine needle aspiration of the dominant left inferior thyroid nodule on 12/15/2010. Cytology report showed rare follicular epithelial cells and hemosiderin laden macrophages.  Marland Kitchen AICD (automatic cardioverter/defibrillator) present    fx lead   . Arthritis    "all over"  . Asthma   . Atrial fibrillation ([place])    on chronic Coumadin; stopped July 2013 due to subdural hematomas  . CHF  (congestive heart failure) (HCC)    EF 35-40% s/p most recent ICD generator change-out with Medtronic dual-chamber ICD 05/20/11 with explantation of previous abdominally-implanted device  . Coronary artery disease    s/p CABG 1983 and PCI/stent 2004.   . Diabetes mellitus    diet controlled  . Dyslipidemia   . Erythrocytosis   . GERD (gastroesophageal reflux disease)   . Hypertension   . Ischemic cardiomyopathy    WITH CHF  . Monocytosis 04/17/2013  . Myocardial infarction ([place]) 1983; ~ 1990  . 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 Pneumonia August 2013  . Subdural hematoma [facility]) July 2013   Anticoagulation stopped.   . VT (ventricular tachycardia) ([place])   . Wears glasses     Patient Active Problem List   Diagnosis Date Noted  . Failure of implantable cardioverter-defibrillator lead 11/10/2016  . ICD (implantable cardioverter-defibrillator) in place 04/08/2016  . Gastroesophageal reflux disease without esophagitis 12/31/2013  . Hypotension 09/23/2011  . HCAP (healthcare-associated pneumonia) 09/23/2011  . SIRS (systemic inflammatory response syndrome) ([place]) 09/23/2011  . Thrombocytopenia ([place]) 09/23/2011  . History of subdural hemorrhage 09/08/2011  . Ataxia 08/17/2011  . Dehydration 08/17/2011  . Atrial fibrillation with controlled ventricular response ([place]) 08/13/2011  . Colon polyp 07/07/2011  . Angiodysplasia of colon 07/07/2011  . GERD (gastroesophageal reflux disease) 05/31/2011  . Anemia, iron deficiency 12/29/2010  . Chronic anticoagulation discontinued July 2013 after developed subdural hematoma 09/22/2010  . Bradycardia 09/22/2010  . CAD (coronary artery disease) 06/25/2010  . ventricular tachycardia 12/30/2009  .  Chronic atrial fibrillation ([place]) 12/30/2009  . Chronic systolic heart failure- EF 35-40% 12/30/2009  . Automatic implantable cardioverter-defibrillator in situ 12/30/2009    Past Surgical History:  Procedure Laterality Date  . CHOLECYSTECTOMY    . COLONOSCOPY   07/07/2011   Procedure: COLONOSCOPY;  Surgeon: Jerene Bears, MD;  Location: [facility] ENDOSCOPY;  Service: Gastroenterology;  Laterality: N/A;  . CORONARY ANGIOPLASTY WITH STENT PLACEMENT  2004   Tandem Cypher stents LAD  . CORONARY ARTERY BYPASS GRAFT  1983   SVG-mLAD  . ESOPHAGOGASTRODUODENOSCOPY  02/11/2011   Procedure: ESOPHAGOGASTRODUODENOSCOPY (EGD);  Surgeon: Beryle Beams, MD;  Location: [facility];  Service: Endoscopy;  Laterality: N/A;  . ICD GENERATOR CHANGEOUT N/A 04/08/2016   Procedure: ICD Generator Changeout;  Surgeon: Evans Lance, MD;  Location: [facility] CV LAB;  Service: Cardiovascular;  Laterality: N/A;  . IMPLANTABLE CARDIOVERTER DEFIBRILLATOR (ICD) GENERATOR CHANGE N/A 05/20/2011   Procedure: ICD GENERATOR CHANGE;  Surgeon: Evans Lance, MD;  Medtronic secure dual-chamber ICD serial number [id number]   . KNEE ARTHROSCOPY     right; "just went in and scraped it"  . MASS EXCISION Right 05/10/2013   Procedure: EXCISION MASS RIGHT THUMB;  Surgeon: Wynonia Sours, MD;  Location: [facility];  Service: Orthopedics;  Laterality: Right;  . PROXIMAL INTERPHALANGEAL FUSION (PIP) Right 05/10/2013   Procedure: DEBRIDEMENT PROXIMAL INTERPHALANGEAL FUSION (PIP);  Surgeon: Wynonia Sours, MD;  Location: [facility];  Service: Orthopedics;  Laterality: Right;  . TONSILLECTOMY             Home Medications    Prior to Admission medications   Medication Sig Start Date End Date Taking? Authorizing Provider  ADVAIR DISKUS 250-50 MCG/DOSE AEPB Inhale 1 puff into the lungs 2 (two) times daily.  11/13/14   [provider]  aspirin 81 MG tablet Take 81-325 mg daily as needed by mouth for headache.    [provider]  calcium carbonate (TUMS - DOSED IN MG ELEMENTAL CALCIUM) 500 MG chewable tablet Chew 3 tablets by mouth daily as needed for heartburn.     [provider]  carvedilol (COREG) 12.5 MG tablet TAKE 1 TABLET (12.5 MG TOTAL) BY MOUTH 2  (TWO) TIMES DAILY. 12/05/16   Evans Lance, MD  fluticasone (FLONASE) 50 MCG/ACT nasal spray Place 2 sprays into both nostrils daily. 01/04/17   [name], MD  furosemide (LASIX) 40 MG tablet Take 1 tablet (40 mg total) by mouth daily. May take an extra tab daily as needed for swelling 01/04/17   [name], MD  gabapentin (NEURONTIN) 100 MG capsule TAKE 1 CAPSULE (100MG ) TO 3 CAPSULES (300MG ) EACH NIGHT AS NEEDED FOR MUSCLE PAINS Patient taking differently: Take 200 mg at bedtime by mouth.  10/05/16   [name], MD  LINZESS 72 MCG capsule Take 72 mcg by mouth daily. 03/07/16   [provider]  lisinopril (PRINIVIL,ZESTRIL) 10 MG tablet TAKE 1 TABLET BY MOUTH EVERY DAY 12/26/16   [name], Peter M, MD  loratadine (CLARITIN) 10 MG tablet Take 10 mg by mouth daily.     [provider]  nitroGLYCERIN (NITROSTAT) 0.4 MG SL tablet PLACE ONE UNDER TONGUE FOR CHEST PAIN. 11/22/16   [name], Peter M, MD  polyethylene glycol [facility] / Floria Raveling) packet Take 17 g by mouth daily. Patient taking differently: Take 17 g daily as needed by mouth for mild constipation.  04/14/16   Charlesetta Shanks, MD  potassium chloride (K-DUR,KLOR-CON)  10 MEQ tablet Take 1 tablet (10 mEq total) by mouth daily. 01/09/13   [name], Peter M, MD  ranitidine (ZANTAC) 150 MG tablet Take 1 tablet (150 mg total) 2 (two) times daily by mouth. 12/20/16   [name], MD  RESTASIS 0.05 % ophthalmic emulsion INSTILL 1 DROP INTO BOTH EYES TWICE A DAY 11/17/16   [name], MD  traMADol (ULTRAM) 50 MG tablet TAKE 2 TABLETS BY MOUTH EVERY 8 HOURS AS NEEDED Patient taking differently: TAKE 100mg  BY MOUTH EVERY 8 HOURS AS NEEDED 08/02/16   [name], MD    Family History Family History  Problem Relation Age of Onset  . Tuberculosis Mother   . Tuberculosis Father   . Heart disease Brother   . Diabetes Sister   . Diabetes Brother   . Clotting disorder Brother     Social History Social History   Tobacco  Use  . Smoking status: Former Smoker    Packs/day: 2.00    Years: 30.00    Pack years: 60.00    Types: Cigarettes    Last attempt to quit: 06/23/1976    Years since quitting: 40.6  . Smokeless tobacco: Never Used  Substance Use Topics  . Alcohol use: No  . Drug use: No     Allergies   Celebrex [celecoxib]; Esomeprazole magnesium; Tamsulosin; Dronedarone; Digoxin; Hydrocodone-acetaminophen; and Protonix [pantoprazole sodium]   Review of Systems Review of Systems  Constitutional: Negative for fever.  Gastrointestinal: Positive for abdominal pain and diarrhea.  All other systems reviewed and are negative.    Physical Exam Updated Vital Signs BP 123/88 (BP Location: Left Arm)   Pulse (!) 132   Temp 97.7 F (36.5 C) (Oral)   Resp 18   Wt 86.6 kg (191 lb)   SpO2 97%   BMI 25.90 kg/m   Physical Exam  Constitutional: He is oriented to person, place, and time. He appears well-developed and well-nourished. No distress.  HENT:  Head: Normocephalic and atraumatic.  Mouth/Throat: Oropharynx is clear and moist.  Eyes: Conjunctivae and EOM are normal. Pupils are equal, round, and reactive to light.  Neck: Normal range of motion. Neck supple.  Cardiovascular: Normal rate, regular rhythm and normal heart sounds.  Pulmonary/Chest: Effort normal and breath sounds normal. No respiratory distress.  Abdominal: Soft. Normal appearance and bowel sounds are normal. He exhibits no distension. There is tenderness in the epigastric area.  Musculoskeletal: Normal range of motion. He exhibits no edema or deformity.  Neurological: He is alert and oriented to person, place, and time.  Skin: Skin is warm and dry.  Psychiatric: He has a normal mood and affect.  Nursing note and vitals reviewed.    ED Treatments / Results  Labs (all labs ordered are listed, but only abnormal results are displayed) Labs Reviewed  COMPREHENSIVE METABOLIC PANEL  LIPASE, BLOOD  CBC WITH DIFFERENTIAL/PLATELET    URINALYSIS, ROUTINE W REFLEX MICROSCOPIC    EKG  EKG Interpretation  Date/Time:  Monday February 06 2017 09:52:05 EST Ventricular Rate:  135 PR Interval:    QRS Duration: 93 QT Interval:  335 QTC Calculation: 503 R Axis:   -21 Text Interpretation:  Atrial fibrillation Borderline left axis deviation Low voltage, extremity leads Nonspecific T abnormalities, lateral leads Prolonged QT interval Confirmed by Dene Gentry [phone]) on 02/06/2017 10:10:03 AM       Radiology No results found.  Procedures Procedures (including critical care time)  Medications Ordered in ED Medications  gi cocktail (Maalox,Lidocaine,Donnatal) (not  administered)  famotidine (PEPCID) IVPB 20 mg premix (not administered)     Initial Impression / Assessment and Plan / ED Course  I have reviewed the triage vital signs and the nursing notes.  Pertinent labs & imaging results that were available during my care of the patient were reviewed by me and considered in my medical decision making (see chart for details).   1130 Patient re-evaluation.  Patient feels improved following GI cocktail and Pepcid.  Patient is awaiting CT imaging.  Screening labs thus far appear mostly unremarkable (with exception of mildly elevated BUN and guaiac positive stool).       MDM screen complete.  Presentation is concerning given patient's epigastric pain and associated GI bleed.  Patient feels improved following ED workup and evaluation.  Will admit to the hospitalist service for further workup of the patient's GI bleed.   [facility] is aware of case - Dr. Doyle Askew aware of case.   Final Clinical Impressions(s) / ED Diagnoses   Final diagnoses:  Epigastric pain    ED Discharge Orders    None       Valarie Merino, MD 02/06/17 1433

## 2017-02-06 NOTE — H&P (Addendum)
History and Physical    [name] QAS:[id number] DOB: April 11, 1932 DOA: 02/06/2017  Referring MD/NP/PA: Dr. Francia Greaves   PCP: [name], MD   Patient coming from: home  Chief Complaint: abd pain   HPI: [name] is a 84 y.o. male with medical history significant of a-fib (was on Coumadin but this was stopped in 2013 due to subdural hematoma), pt follows with Dr. Crissie Sickles, AICD, chronic systolic CHF, CAD, DM, HLD, presents with main concern of fairly sudden onset of epigastric and lower quadrants abd pain that was somewhat similar to his typical GERD episodes but says this time was much worse. Pain is intermittent and throbbing, 7/10 in severity, non radiating, no specific alleviating or aggravating factors, associated with mixed episodes of bloody and non bloody diarrhea. Pt denies any known sick contacts or exposures, no fevers, chills, no urinary concerns, no dyspnea and no chest pain .   ED Course: in ED, pt is hemodynamically stable, VS notable for HR up to 120's, blood work notable for Cr 1.39 which is pt's baseline. FOBT + and imaging studies concerning for acute proctitis. TRh asked to admit for further evaluation.   Review of Systems:  Constitutional: Negative for fever, chills, diaphoresis HENT: Negative for ear pain, nosebleeds, congestion, facial swelling, rhinorrhea, neck pain, neck stiffness and ear discharge.   Eyes: Negative for pain, discharge, redness, itching and visual disturbance.  Respiratory: Negative for cough, choking, chest tightness, shortness of breath, wheezing and stridor.   Cardiovascular: Negative for palpitations and leg swelling.  Gastrointestinal: Negative for abdominal distention.  Genitourinary: Negative for dysuria, urgency, frequency, hematuria, flank pain, decreased urine  Musculoskeletal: Negative for back pain, joint swelling, arthralgias and gait problem.  Neurological: Negative for dizziness, tremors, seizures, syncope, facial  asymmetr Hematological: Negative for adenopathy. Does not bruise/bleed easily.  Psychiatric/Behavioral: Negative for hallucinations, behavioral problems, confusion, dysphoric mood, decreased concentration and agitation.   Past Medical History:  Diagnosis Date  . Abnormal thyroid scan    Abnormal thyroid imaging studies from 11/09/2010, status post ultrasound guided fine needle aspiration of the dominant left inferior thyroid nodule on 12/15/2010. Cytology report showed rare follicular epithelial cells and hemosiderin laden macrophages.  Marland Kitchen AICD (automatic cardioverter/defibrillator) present    fx lead   . Arthritis    "all over"  . Asthma   . Atrial fibrillation ([place])    on chronic Coumadin; stopped July 2013 due to subdural hematomas  . CHF (congestive heart failure) (HCC)    EF 35-40% s/p most recent ICD generator change-out with Medtronic dual-chamber ICD 05/20/11 with explantation of previous abdominally-implanted device  . Coronary artery disease    s/p CABG 1983 and PCI/stent 2004.   . Diabetes mellitus    diet controlled  . Dyslipidemia   . Erythrocytosis   . GERD (gastroesophageal reflux disease)   . Hypertension   . Ischemic cardiomyopathy    WITH CHF  . Monocytosis 04/17/2013  . Myocardial infarction ([place]) 1983; ~ 1990  . 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 Pneumonia August 2013  . Subdural hematoma [facility]) July 2013   Anticoagulation stopped.   . VT (ventricular tachycardia) ([place])   . Wears glasses     Past Surgical History:  Procedure Laterality Date  . CHOLECYSTECTOMY    . COLONOSCOPY  07/07/2011   Procedure: COLONOSCOPY;  Surgeon: Jerene Bears, MD;  Location: [facility];  Service: Gastroenterology;  Laterality: N/A;  . CORONARY ANGIOPLASTY WITH STENT PLACEMENT  2004   Tandem  Cypher stents LAD  . CORONARY ARTERY BYPASS GRAFT  1983   SVG-mLAD  . ESOPHAGOGASTRODUODENOSCOPY  02/11/2011   Procedure: ESOPHAGOGASTRODUODENOSCOPY (EGD);  Surgeon: Beryle Beams, MD;  Location: [facility];  Service: Endoscopy;  Laterality: N/A;  . ICD GENERATOR CHANGEOUT N/A 04/08/2016   Procedure: ICD Generator Changeout;  Surgeon: Evans Lance, MD;  Location: [place] CV LAB;  Service: Cardiovascular;  Laterality: N/A;  . IMPLANTABLE CARDIOVERTER DEFIBRILLATOR (ICD) GENERATOR CHANGE N/A 05/20/2011   Procedure: ICD GENERATOR CHANGE;  Surgeon: Evans Lance, MD;  Medtronic secure dual-chamber ICD serial number [id number]   . KNEE ARTHROSCOPY     right; "just went in and scraped it"  . MASS EXCISION Right 05/10/2013   Procedure: EXCISION MASS RIGHT THUMB;  Surgeon: Wynonia Sours, MD;  Location: [facility];  Service: Orthopedics;  Laterality: Right;  . PROXIMAL INTERPHALANGEAL FUSION (PIP) Right 05/10/2013   Procedure: DEBRIDEMENT PROXIMAL INTERPHALANGEAL FUSION (PIP);  Surgeon: Wynonia Sours, MD;  Location: [facility];  Service: Orthopedics;  Laterality: Right;  . TONSILLECTOMY         Social Hx:  reports that he quit smoking about 40 years ago. His smoking use included cigarettes. He has a 60.00 pack-year smoking history. he has never used smokeless tobacco. He reports that he does not drink alcohol or use drugs.  Allergies  Allergen Reactions  . Celebrex [Celecoxib] Hives and Other (See Comments)    Gi upset  . Esomeprazole Magnesium Hives and Other (See Comments)    Other reaction(s): Hives "don't really remember"  . Tamsulosin Other (See Comments)    Dizziness, Made BP very low and weakness  . Dronedarone Other (See Comments)    Other reaction(s): GI upset, abdominal pain  . Digoxin Other (See Comments)    Unknown   . Hydrocodone-Acetaminophen Other (See Comments)    Bad headache  . Protonix [Pantoprazole Sodium] Nausea And Vomiting    Tolerates Dexilant    Family History  Problem Relation Age of Onset  . Tuberculosis Mother   . Tuberculosis Father   . Heart disease Brother   . Diabetes Sister   . Diabetes Brother   . Clotting  disorder Brother     Medication Sig  ADVAIR DISKUS 250-50 MCG/DOSE AEPB Inhale 1 puff into the lungs 2 (two) times daily.   aspirin 81 MG tablet Take 81-325 mg daily as needed by mouth for headache.  carvedilol (COREG) 12.5 MG tablet TAKE 1 TABLET (12.5 MG TOTAL) BY MOUTH 2 (TWO) TIMES DAILY.  fluticasone (FLONASE) 50 MCG/ACT nasal spray Place 2 sprays into both nostrils daily.  furosemide (LASIX) 40 MG tablet Take 1 tablet (40 mg total) by mouth daily. May take an extra tab daily as needed for swelling  gabapentin (NEURONTIN) 100 MG capsule TAKE 1 CAPSULE (100MG ) TO 3 CAPSULES (300MG ) EACH NIGHT AS NEEDED FOR MUSCLE PAINS  LINZESS 72 MCG capsule Take 72 mcg by mouth daily.  lisinopril (PRINIVIL,ZESTRIL) 10 MG tablet TAKE 1 TABLET BY MOUTH EVERY DAY  potassium chloride (K-DUR,KLOR-CON) 10 MEQ tablet Take 1 tablet (10 mEq total) by mouth daily.  ranitidine (ZANTAC) 150 MG tablet Take 1 tablet (150 mg total) 2 (two) times daily by mouth.    Physical Exam: Vitals:   02/06/17 1100 02/06/17 1200 02/06/17 1438 02/06/17 1630  BP: 134/82 (!) 120/92 (!) 142/93 (!) 117/93  Pulse:  (!) 126 (!) 123 (!) 123  Resp: (!) 23 (!) 24 18 18   Temp:  TempSrc:   Oral   SpO2:  97% 100% 98%  Weight:        Constitutional: NAD, calm, comfortable Vitals:   02/06/17 1100 02/06/17 1200 02/06/17 1438 02/06/17 1630  BP: 134/82 (!) 120/92 (!) 142/93 (!) 117/93  Pulse:  (!) 126 (!) 123 (!) 123  Resp: (!) 23 (!) 24 18 18   Temp:      TempSrc:   Oral   SpO2:  97% 100% 98%  Weight:       Eyes: PERRL, lids and conjunctivae normal ENMT: Mucous membranes are moist. Posterior pharynx clear of any exudate or lesions.Normal dentition.  Neck: normal, supple, no masses, no thyromegaly Respiratory: clear to auscultation bilaterally, no wheezing, no crackles.  Cardiovascular: IRRR, no gallops or clicks, no JVD Abdomen: slightly tender in lower abd quadrants, normal bowel sounds  Musculoskeletal: no clubbing /  cyanosis. No joint deformity upper and lower extremities.  Skin: no rashes, lesions, ulcers. No induration Neurologic: CN 2-12 grossly intact. Sensation intact, DTR normal. Strength 5/5 in all 4.  Psychiatric: Normal judgment and insight. Alert and oriented x 3. Normal mood.   Labs on Admission: I have personally reviewed following labs and imaging studies  CBC: Recent Labs  Lab 02/06/17 1039  WBC 8.2  NEUTROABS 3.9  HGB 13.9  HCT 41.8  MCV 88.7  PLT 211*   Basic Metabolic Panel: Recent Labs  Lab 02/06/17 1039  NA 137  K 4.4  CL 102  CO2 24  GLUCOSE 107*  BUN 25*  CREATININE 1.38*  CALCIUM 9.6   Liver Function Tests: Recent Labs  Lab 02/06/17 1039  AST 36  ALT 14*  ALKPHOS 73  BILITOT 2.1*  PROT 7.3  ALBUMIN 4.3   Recent Labs  Lab 02/06/17 1039  LIPASE 27   Urine analysis:    Component Value Date/Time   COLORURINE YELLOW 02/06/2017 1128   APPEARANCEUR HAZY (A) 02/06/2017 1128   LABSPEC 1.018 02/06/2017 1128   PHURINE 5.0 02/06/2017 1128   GLUCOSEU NEGATIVE 02/06/2017 1128   HGBUR SMALL (A) 02/06/2017 1128   BILIRUBINUR NEGATIVE 02/06/2017 1128   KETONESUR 20 (A) 02/06/2017 1128   PROTEINUR 30 (A) 02/06/2017 1128   UROBILINOGEN 0.2 09/09/2012 1501   NITRITE NEGATIVE 02/06/2017 1128   LEUKOCYTESUR MODERATE (A) 02/06/2017 1128   Radiological Exams on Admission:  Ct Abdomen Pelvis W Contrast Result Date: 02/06/2017 Possible acute proctitis, with presacral stranding and indistinct appearance of the rectum. No associated free fluid or complicating features. 2. Elsewhere the bowel appears stable and noninflamed. 3. No other acute finding identified in the abdomen or pelvis. 4. Chronic findings including right lower lobe round atelectasis, nephrolithiasis, Aortic Atherosclerosis (ICD10-I70.0) and Ectatic abdominal aorta at risk for aneurysm development.  EKG: pending   Assessment/Plan Active Problems:   Abdominal pain, blood and non bloody diarrhea -  unclear etiology, review of records indicate that pt has seen his PCP for concern of abd pain and diarrhea in November 2018 and there was a ? Of enteritis - per CT abd findings worrisome for acute proctitis  - FOBT + - discussed with GI on call, certainly worrisome that pt has underlying illness and we must ruleof infectious source first - recommendation was to obtain GI stool panel, C. Diff - GI team will see in AM and decide If pt will need to be scoped  - will keep NPO after midnight   UTI - place on Rocephin IV and follow up on urine cultures   Chronic  systolic CHF - no evidence of volume overload - monitor weight, I/O - continue home regimen coreg and lisinopril, lasxi - monitor on tele   AICD - pt follows with Dr. Lovena Le - keep on tele   A-fib with RVR - continue coreg, added metoprolol as needed - no Coumadin since 2013 due to subdural hematoma  - monitor on telemetry   DVT prophylaxis: SCD's Code Status: Full  Family Communication: Pt updated at bedside Disposition Plan: pt will go home once medically stabilized  Consults called: none Admission status: It is my medical opinion that pt meets inpatient criteria. Pt is acutely ill with abd pain and findings of acute proctitis on CT. Given that pt has been seen in the recent past for similar problem, it is of high concern that pt may have underlying infection. Pt needs further evaluation with stool studies and required consultation by GI specialist to determine If colonoscopy is needed. In addition pt with UTI and requires Rocephin IV until urine cultures are back. In addition, pt is in a-fib with RVR and must be monitored on telemetry. Pt is at high risk for clinical deterioration as he is no on blood thinner due to history of subdural hematoma.   Faye Ramsay MD [facility] Pager [phone]  If 7PM-7AM, please contact night-coverage [email or web address] Password [email or web address]  02/06/2017, 5:03 PM

## 2017-02-06 NOTE — Progress Notes (Signed)
PHARMACY NOTE -  ANTIBIOTIC RENAL DOSE ADJUSTMENT   Request received for Pharmacy to assist with antibiotic renal dose adjustment.  Patient has been initiated on Ceftriaxone 2gm iv q24hr  for UTI. SCr 1.38, estimated CrCl 45 ml/min Current dosage is appropriate and need for further dosage adjustment appears unlikely at present. Will sign off at this time.  Please reconsult if a change in clinical status warrants re-evaluation of dosage.

## 2017-02-06 NOTE — Care Management Obs Status (Signed)
[place] NOTIFICATION   Patient Details  Name: [name] MRN: [id number] Date of Birth: 1932/09/07   Medicare Observation Status Notification Given:  Yes    Javad Salva, Benjaman Lobe, RN 02/06/2017, 5:18 PM

## 2017-02-06 NOTE — ED Triage Notes (Signed)
Patient BIB EMS from home with complaints of abdominal pain x2 days, with associated diarrhea and "a little bit of blood" when he has a bowel movement. Patient reports history of hemorrhoids. Patient denies nausea/vomiting. Patient denies fever/chills.  EMS Vitals: 124/72, HR 94

## 2017-02-06 NOTE — ED Notes (Signed)
Bed: WA08 Expected date:  Expected time:  Means of arrival:  Comments: EMS abdominal pain 

## 2017-02-06 NOTE — Telephone Encounter (Signed)
Return call received from Pt.  Per Pt he is not having any further chest pain.  Pt is having stomach issues now and will see his PCP.  Pt states he will call if any further needs.

## 2017-02-07 DIAGNOSIS — K6289 Other specified diseases of anus and rectum: Secondary | ICD-10-CM

## 2017-02-07 DIAGNOSIS — R1084 Generalized abdominal pain: Secondary | ICD-10-CM

## 2017-02-07 DIAGNOSIS — R197 Diarrhea, unspecified: Secondary | ICD-10-CM

## 2017-02-07 DIAGNOSIS — R1013 Epigastric pain: Secondary | ICD-10-CM

## 2017-02-07 DIAGNOSIS — R195 Other fecal abnormalities: Secondary | ICD-10-CM

## 2017-02-07 DIAGNOSIS — R935 Abnormal findings on diagnostic imaging of other abdominal regions, including retroperitoneum: Secondary | ICD-10-CM

## 2017-02-07 LAB — GASTROINTESTINAL PANEL BY PCR, STOOL (REPLACES STOOL CULTURE)

## 2017-02-07 LAB — BASIC METABOLIC PANEL
ANION GAP: 8 (ref 5–15)
BUN: 22 mg/dL — ABNORMAL HIGH (ref 6–20)
CALCIUM: 8.8 mg/dL — AB (ref 8.9–10.3)
CO2: 23 mmol/L (ref 22–32)
Chloride: 107 mmol/L (ref 101–111)
Creatinine, Ser: 1.18 mg/dL (ref 0.61–1.24)
GFR, EST NON AFRICAN AMERICAN: 56 mL/min — AB (ref >60)
Glucose, Bld: 90 mg/dL (ref 65–99)
Potassium: 4 mmol/L (ref 3.5–5.1)
Sodium: 138 mmol/L (ref 135–145)

## 2017-02-07 LAB — CBC
HCT: 39.5 % (ref 39.0–52.0)
HEMOGLOBIN: 12.9 g/dL — AB (ref 13.0–17.0)
MCH: 29.1 pg (ref 26.0–34.0)
MCHC: 32.7 g/dL (ref 30.0–36.0)
MCV: 89.2 fL (ref 78.0–100.0)
Platelets: 123 10*3/uL — ABNORMAL LOW (ref 150–400)
RBC: 4.43 MIL/uL (ref 4.22–5.81)
RDW: 15 % (ref 11.5–15.5)
WBC: 5.8 10*3/uL (ref 4.0–10.5)

## 2017-02-07 LAB — MRSA PCR SCREENING: MRSA by PCR: NEGATIVE

## 2017-02-07 LAB — TSH: TSH: 0.553 u[IU]/mL (ref 0.350–4.500)

## 2017-02-07 MED ORDER — CARVEDILOL 6.25 MG PO TABS
18.7500 mg | ORAL_TABLET | Freq: Two times a day (BID) | ORAL | Status: DC
  Administered 2017-02-07 – 2017-02-08 (×2): 18.75 mg via ORAL
  Filled 2017-02-07 (×2): qty 1

## 2017-02-07 MED ORDER — METRONIDAZOLE 500 MG PO TABS
500.0000 mg | ORAL_TABLET | Freq: Three times a day (TID) | ORAL | Status: DC
  Administered 2017-02-07 – 2017-02-09 (×8): 500 mg via ORAL
  Filled 2017-02-07 (×8): qty 1

## 2017-02-07 MED ORDER — CARVEDILOL 6.25 MG PO TABS
6.2500 mg | ORAL_TABLET | Freq: Once | ORAL | Status: AC
  Administered 2017-02-07: 6.25 mg via ORAL
  Filled 2017-02-07: qty 1

## 2017-02-07 MED ORDER — FLEET ENEMA 7-19 GM/118ML RE ENEM
2.0000 | ENEMA | Freq: Once | RECTAL | Status: AC
  Administered 2017-02-08: 2 via RECTAL
  Filled 2017-02-07: qty 2

## 2017-02-07 MED ORDER — POTASSIUM CHLORIDE CRYS ER 10 MEQ PO TBCR
10.0000 meq | EXTENDED_RELEASE_TABLET | Freq: Every day | ORAL | Status: DC
  Administered 2017-02-07: 10 meq via ORAL
  Filled 2017-02-07: qty 1

## 2017-02-07 NOTE — Progress Notes (Signed)
PROGRESS NOTE    [name]  WJX:[id number] DOB: 1932/10/07 DOA: 02/06/2017 PCP: Joshua Morale, MD   Brief Narrative:  [name] is [name] 84 y.o. male with medical history significant of [name]-fib (was on Coumadin but this was stopped in 2013 due to subdural hematoma), pt follows with Dr. Crissie [name], AICD, chronic systolic CHF, CAD, DM, HLD, presents with main concern of fairly sudden onset of epigastric and lower quadrants abd pain that was somewhat similar to his typical GERD episodes but says this time was much worse. Pain is intermittent and throbbing, 7/10 in severity, non radiating, no specific alleviating or aggravating factors, associated with mixed episodes of bloody and non bloody diarrhea. Pt denies any known sick contacts or exposures, no fevers, chills, no urinary concerns, no dyspnea and no chest pain .   Assessment & Plan:   Active Problems:   Abdominal pain   Acute radiation proctitis   Abdominal Pain  Bloody Diarrhea  Possible Acute Proctitis:  CT with presacral stranding and indistinct appearance of rectum.  Pain seems improved today.  He's had several CT's for abdominal pain over the past few months.  Ceftriaxone, will add on flagyl + FOBT GI path panel pending, C diff not sent as stool formed GI c/s pending, appreciate recs  UTI: UA with + WBC, RBC's, neg nitrite.  Cx pending. Continue ceftriaxone. [ ]  urine cx pending  Chronic Systolic HF  AICD  CAD  H/o VT: Continue carvdedilol Continue lasix Continue lisinopril Telemetry  Afib with RVR - continue carvedilol, will increase to 18.75 mg BID, continue to watch HR - continue to monitor - no coumadin after subdural hematoma  Thrombocytopenia:  - continue to monitor  CKD stage III: creatinine below baseline  Elevated Bilirubin: no gallbladder, no ductal dilatiation.  Ctm.  Ectatic Abdominal Aorta: f/u US recommended in 5 years  DVT prophylaxis: SCD Code Status: full  Family Communication:  none at bedside Disposition Plan: pending   Consultants:   GI  Procedures:   none  Antimicrobials:  Anti-infectives (From admission, onward)   Start     Dose/Rate Route Frequency Ordered Stop   02/07/17 0830  metroNIDAZOLE (FLAGYL) tablet 500 mg     500 mg Oral Every 8 hours 02/07/17 0828     02/06/17 2315  cefTRIAXone (ROCEPHIN) 2 g in dextrose 5 % 50 mL IVPB     2 g 100 mL/hr over 30 Minutes Intravenous Daily at bedtime 02/06/17 2314        Subjective: Abdominal pain is better. Chronic knee pain.   Objective: Vitals:   02/06/17 2245 02/06/17 2304 02/07/17 0356 02/07/17 0612  BP: 125/82 (!) 148/91 125/73   Pulse: (!) 112 (!) 122 (!) 117   Resp: 18 20    Temp:  98.3 F (36.8 C) 98 F (36.7 C)   TempSrc:  Oral Oral   SpO2: 94% 99% 96%   Weight:  87.1 kg (192 lb 0.3 oz)  87.1 kg (192 lb 0.3 oz)  Height:  6' (1.829 m)      Intake/Output Summary (Last 24 hours) at 02/07/2017 0825 Last data filed at 02/07/2017 0650 Gross per 24 hour  Intake 660 ml  Output 100 ml  Net 560 ml   Filed Weights   02/06/17 0941 02/06/17 2304 02/07/17 0612  Weight: 86.6 kg (191 lb) 87.1 kg (192 lb 0.3 oz) 87.1 kg (192 lb 0.3 oz)    Examination:  General exam: Appears calm and comfortable  Respiratory  system: Clear to auscultation. Respiratory effort normal. Cardiovascular system: S1 & S2 heard, irregularly irregular, tachy. No JVD, murmurs, rubs, gallops or clicks. No pedal edema. Gastrointestinal system: Abdomen is nondistended, soft and nontender. No organomegaly or masses felt. Normal bowel sounds heard. Central nervous system: Alert and oriented. No focal neurological deficits. Extremities: no LEE, palpable pulses Skin: No rashes, lesions or ulcers Psychiatry: Judgement and insight appear normal. Mood & affect appropriate.     Data Reviewed: I have personally reviewed following labs and imaging studies  CBC: Recent Labs  Lab 02/06/17 1039 02/07/17 0519  WBC 8.2 5.8    NEUTROABS 3.9  --   HGB 13.9 12.9*  HCT 41.8 39.5  MCV 88.7 89.2  PLT 143* 213*   Basic Metabolic Panel: Recent Labs  Lab 02/06/17 1039 02/07/17 0519  NA 137 138  K 4.4 4.0  CL 102 107  CO2 24 23  GLUCOSE 107* 90  BUN 25* 22*  CREATININE 1.38* 1.18  CALCIUM 9.6 8.8*   GFR: Estimated Creatinine Clearance: 53 mL/min (by C-G formula based on SCr of 1.18 mg/dL). Liver Function Tests: Recent Labs  Lab 02/06/17 1039  AST 36  ALT 14*  ALKPHOS 73  BILITOT 2.1*  PROT 7.3  ALBUMIN 4.3   Recent Labs  Lab 02/06/17 1039  LIPASE 27   No results for input(s): AMMONIA in the last 168 hours. Coagulation Profile: No results for input(s): INR, PROTIME in the last 168 hours. Cardiac Enzymes: No results for input(s): CKTOTAL, CKMB, CKMBINDEX, TROPONINI in the last 168 hours. BNP (last 3 results) No results for input(s): PROBNP in the last 8760 hours. HbA1C: No results for input(s): HGBA1C in the last 72 hours. CBG: No results for input(s): GLUCAP in the last 168 hours. Lipid Profile: No results for input(s): CHOL, HDL, LDLCALC, TRIG, CHOLHDL, LDLDIRECT in the last 72 hours. Thyroid Function Tests: Recent Labs    02/06/17 2315  TSH 0.553   Anemia Panel: No results for input(s): VITAMINB12, FOLATE, FERRITIN, TIBC, IRON, RETICCTPCT in the last 72 hours. Sepsis Labs: No results for input(s): PROCALCITON, LATICACIDVEN in the last 168 hours.  Recent Results (from the past 240 hour(s))  MRSA PCR Screening     Status: None   Collection Time: 02/07/17  2:03 AM  Result Value Ref Range Status   MRSA by PCR NEGATIVE NEGATIVE Final    Comment:        The GeneXpert MRSA Assay (FDA approved for NASAL specimens only), is one component of [name] comprehensive MRSA colonization surveillance program. It is not intended to diagnose MRSA infection nor to guide or monitor treatment for MRSA infections.          Radiology Studies: Ct Abdomen Pelvis W Contrast  Result Date:  02/06/2017 CLINICAL DATA:  84 year old male with 2 days of abdominal pain and diarrhea. EXAM: CT ABDOMEN AND PELVIS WITH CONTRAST TECHNIQUE: Multidetector CT imaging of the abdomen and pelvis was performed using the standard protocol following bolus administration of intravenous contrast. CONTRAST:  141mL ISOVUE-300 IOPAMIDOL (ISOVUE-300) INJECTION 61% COMPARISON:  CT Abdomen and Pelvis 12/23/2016 and earlier. FINDINGS: Lower chest: Partially visible AICD leads. Stable cardiomegaly. No pericardial effusion. Stable lung base ventilation. Chronic lung base scarring and opacity, maximal in the medial basal segment of the right lower lobe where chronic round atelectasis is suspected. Unchanged trace right pleural fluid. Hepatobiliary: Surgically absent gallbladder. Stable liver including lobulated contour. No biliary ductal enlargement. Pancreas: Negative. Spleen: Stable lobulated spleen with benign calcification. Adrenals/Urinary Tract:  Normal adrenal glands. Bilateral renal enhancement and contrast excretion is symmetric and within normal limits. Chronic bilateral nephrolithiasis is stable measuring 5-6 mm in each lower pole. No acute perinephric stranding. Negative course of both ureters. Mildly to moderately distended but otherwise unremarkable urinary bladder. Stomach/Bowel: Indistinct appearance of the rectum which appears to be fluid-filled but may be opacified due to mucosal thickening/inflammation (series 2, image 76). The sigmoid colon is decompressed. There is chronic proximal sigmoid diverticula without active inflammation. Decompressed descending colon. Negative transverse colon. Negative right colon and appendix (coronal image 44). Negative terminal ileum. Intermittent fluid and gas-filled small bowel loops, but no abnormally dilated small bowel. Decompressed stomach and duodenum. No abdominal free fluid or free air. Vascular/Lymphatic: Aortoiliac calcified atherosclerosis. Major arterial structures remain  patent. Stable ectasia of the infrarenal abdominal aorta up to 25 mm diameter. Similar stable bilateral iliac artery ectasia. Portal venous system appears patent. No lymphadenopathy. Reproductive: Negative. Other: No pelvic free fluid, but mild presacral stranding since November (series 2, image 74). Musculoskeletal: Prior sternotomy. Stable visualized osseous structures. Chronic epicardial pacer leads in the left ventral abdominal wall are stable. IMPRESSION: 1. Possible acute proctitis, with presacral stranding and indistinct appearance of the rectum. No associated free fluid or complicating features. 2. Elsewhere the bowel appears stable and noninflamed. 3. No other acute finding identified in the abdomen or pelvis. 4. Chronic findings including right lower lobe round atelectasis, nephrolithiasis, Aortic Atherosclerosis (ICD10-I70.0) and Ectatic abdominal aorta at risk for aneurysm development. Recommend followup by Aortic Ultrasound in 5 years. This recommendation follows ACR consensus guidelines: White Paper of the ACR Incidental Findings Committee II on Vascular Findings. J Am Coll Radiol 2013; 10:789-794. Electronically Signed   By: Genevie Ann M.D.   On: 02/06/2017 13:46        Scheduled Meds: . carvedilol  12.5 mg Oral BID WC  . cycloSPORINE  1 drop Both Eyes BID  . famotidine  20 mg Oral Daily  . fluticasone  2 spray Each Nare Daily  . furosemide  40 mg Oral Daily  . gabapentin  200 mg Oral QHS  . lisinopril  10 mg Oral Daily  . mometasone-formoterol  2 puff Inhalation BID   Continuous Infusions: . cefTRIAXone (ROCEPHIN)  IV Stopped (02/07/17 0041)     LOS: 1 day    Time spent: over 1 min    Fayrene Helper, MD [facility] Pager [phone]  If 7PM-7AM, please contact night-coverage [email or web address] Password [email or web address] 02/07/2017, 8:25 AM

## 2017-02-07 NOTE — Care Management Note (Signed)
Case Management Note  Patient Details  Name: [name] MRN: [id number] Date of Birth: 09/30/1932  Subjective/Objective: 84 y/o m admitted w/abd pain. From home alone. Patient states he drives,has a cane, takes his own meds. CM referral-HHC needs-patient states he has no HHC needs.                    Action/Plan:d/c home.   Expected Discharge Date:  (unknown)               Expected Discharge Plan:  Home/Self Care  In-House Referral:     Discharge planning Services  CM Consult  Post Acute Care Choice:  Durable Medical Equipment(cane) Choice offered to:     DME Arranged:    DME Agency:     HH Arranged:    HH Agency:     Status of Service:  In process, will continue to follow  If discussed at Long Length of Stay Meetings, dates discussed:    Additional Comments:  Dessa Phi, RN 02/07/2017, 11:33 AM

## 2017-02-07 NOTE — Consult Note (Signed)
Consultation  Referring Provider:  Dr. Cephus Slater    Primary Care Physician:  [name], MD Primary Gastroenterologist:  Dr. Hilarie Fredrickson       Reason for Consultation: Rectal thickening on CT, rectal bleeding           HPI:   [name] is a 84 y.o. male with extensive past medical history as listed below including AFib (was on Coumadin-stopped in 2013 due to subdural hematoma), CHF (EF 35-40% in 2014) with ICD, CAD  S/p CABG in 1983 and stent in 2004 and DM, who presented to the ER on 02/06/17 with a complaint of abdominal pain.      Today, it should be noted that the patient is a VERY POOR historian.  He starts by telling me that he was feeling fine last week but started with some bright red blood streaking the toilet paper and looser bowel movements one day last week.  He is unsure what day. Later he tells me that actually he was not feeling good all week because "I stopped eating because my belly hurt".  Apparently he had some mixture of epigastric and lower abdominal pain which may have been exacerbated by eating. He describes chronic reflux, which is controlled if "I take that medicine". He also had some measure of change in bowel habits towards looser stool, though not that much more frequently and occasional brb streaking the toilet paper over the past week. He denies rectal pain.     Patient deneies fever, chills, nausea, vomiting, sick contacts, change in diet, change in medications or symptoms which awoke him from sleep.  ED Course: in ED, pt was hemodynamically stable, VS notable for HR up to 120's, blood work notable for Cr 1.39 which is pt's baseline. FOBT + and imaging studies concerning for acute proctitis.   Previous GI History: 12/16/13-OV, Alonza Bogus, PA-C: dysphagia and GERD-oropharyngeal-esophagram and speech path referral decline-continued on Dexilant 60mg  qd and carafate was added 07/07/11-Colonoscopy, Dr. Hilarie Fredrickson: Impression: 2 angioectasias in ascending colon ablated  with APC, 3 polyps in descending colon removed, sessile polyp in sigmoid colon removed and mild diverticulosis in the left colon; Pathology: adenomatous polyps-repeat recommended in 3 yrs 02/11/11-EGD, Dr. Benson Norway: Impression:?distal esophageal varix vs vascular bleb? Portal htn gastropathy  Past Medical History:  Diagnosis Date  . Abnormal thyroid scan    Abnormal thyroid imaging studies from 11/09/2010, status post ultrasound guided fine needle aspiration of the dominant left inferior thyroid nodule on 12/15/2010. Cytology report showed rare follicular epithelial cells and hemosiderin laden macrophages.  Marland Kitchen AICD (automatic cardioverter/defibrillator) present    fx lead   . Arthritis    "all over"  . Asthma   . Atrial fibrillation ([place])    on chronic Coumadin; stopped July 2013 due to subdural hematomas  . CHF (congestive heart failure) (HCC)    EF 35-40% s/p most recent ICD generator change-out with Medtronic dual-chamber ICD 05/20/11 with explantation of previous abdominally-implanted device  . Coronary artery disease    s/p CABG 1983 and PCI/stent 2004.   . Diabetes mellitus    diet controlled  . Dyslipidemia   . Erythrocytosis   . GERD (gastroesophageal reflux disease)   . Hypertension   . Ischemic cardiomyopathy    WITH CHF  . Monocytosis 04/17/2013  . Myocardial infarction ([place]) 1983; ~ 1990  . 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 Pneumonia August 2013  . Subdural hematoma [facility]) July 2013   Anticoagulation stopped.   Marland Kitchen  VT (ventricular tachycardia) ([place])   . Wears glasses     Past Surgical History:  Procedure Laterality Date  . CHOLECYSTECTOMY    . COLONOSCOPY  07/07/2011   Procedure: COLONOSCOPY;  Surgeon: Jerene Bears, MD;  Location: [facility];  Service: Gastroenterology;  Laterality: N/A;  . CORONARY ANGIOPLASTY WITH STENT PLACEMENT  2004   Tandem Cypher stents LAD  . CORONARY ARTERY BYPASS GRAFT  1983   SVG-mLAD  . ESOPHAGOGASTRODUODENOSCOPY  02/11/2011   Procedure:  ESOPHAGOGASTRODUODENOSCOPY (EGD);  Surgeon: Beryle Beams, MD;  Location: [facility];  Service: Endoscopy;  Laterality: N/A;  . ICD GENERATOR CHANGEOUT N/A 04/08/2016   Procedure: ICD Generator Changeout;  Surgeon: Evans Lance, MD;  Location: [place] [facility];  Service: Cardiovascular;  Laterality: N/A;  . IMPLANTABLE CARDIOVERTER DEFIBRILLATOR (ICD) GENERATOR CHANGE N/A 05/20/2011   Procedure: ICD GENERATOR CHANGE;  Surgeon: Evans Lance, MD;  Medtronic secure dual-chamber ICD serial number [id number]   . KNEE ARTHROSCOPY     right; "just went in and scraped it"  . MASS EXCISION Right 05/10/2013   Procedure: EXCISION MASS RIGHT THUMB;  Surgeon: Wynonia Sours, MD;  Location: [facility];  Service: Orthopedics;  Laterality: Right;  . PROXIMAL INTERPHALANGEAL FUSION (PIP) Right 05/10/2013   Procedure: DEBRIDEMENT PROXIMAL INTERPHALANGEAL FUSION (PIP);  Surgeon: Wynonia Sours, MD;  Location: [facility];  Service: Orthopedics;  Laterality: Right;  . TONSILLECTOMY          Family History  Problem Relation Age of Onset  . Tuberculosis Mother   . Tuberculosis Father   . Heart disease Brother   . Diabetes Sister   . Diabetes Brother   . Clotting disorder Brother     Social History   Tobacco Use  . Smoking status: Former Smoker    Packs/day: 2.00    Years: 30.00    Pack years: 60.00    Types: Cigarettes    Last attempt to quit: 06/23/1976    Years since quitting: 40.6  . Smokeless tobacco: Never Used  Substance Use Topics  . Alcohol use: No  . Drug use: No    Prior to Admission medications   Medication Sig Start Date End Date Taking? Authorizing Provider  ADVAIR DISKUS 250-50 MCG/DOSE AEPB Inhale 1 puff into the lungs 2 (two) times daily.  11/13/14  Yes [provider]  aspirin 81 MG tablet Take 81-325 mg daily as needed by mouth for headache.   Yes [provider]  calcium carbonate (TUMS - DOSED IN MG ELEMENTAL CALCIUM) 500 MG  chewable tablet Chew 3 tablets by mouth daily as needed for heartburn.    Yes [provider]  carvedilol (COREG) 12.5 MG tablet TAKE 1 TABLET (12.5 MG TOTAL) BY MOUTH 2 (TWO) TIMES DAILY. 12/05/16  Yes Evans Lance, MD  fluticasone (FLONASE) 50 MCG/ACT nasal spray Place 2 sprays into both nostrils daily. 01/04/17  Yes [name], MD  furosemide (LASIX) 40 MG tablet Take 1 tablet (40 mg total) by mouth daily. May take an extra tab daily as needed for swelling 01/04/17  Yes [name], MD  gabapentin (NEURONTIN) 100 MG capsule TAKE 1 CAPSULE (100MG ) TO 3 CAPSULES (300MG ) EACH NIGHT AS NEEDED FOR MUSCLE PAINS Patient taking differently: Take 200 mg at bedtime by mouth.  10/05/16  Yes [name], MD  LINZESS 72 MCG capsule Take 72 mcg by mouth daily. 03/07/16  Yes [provider]  lisinopril (PRINIVIL,ZESTRIL) 10  MG tablet TAKE 1 TABLET BY MOUTH EVERY DAY 12/26/16  Yes [name], Peter M, MD  loratadine (CLARITIN) 10 MG tablet Take 10 mg by mouth daily.    Yes [provider]  nitroGLYCERIN (NITROSTAT) 0.4 MG SL tablet PLACE ONE UNDER TONGUE FOR CHEST PAIN. 11/22/16  Yes [name], Peter M, MD  polyethylene glycol [facility] Floria Raveling) packet Take 17 g by mouth daily. Patient taking differently: Take 17 g daily as needed by mouth for mild constipation.  04/14/16  Yes Pfeiffer, Jeannie Done, MD  potassium chloride (K-DUR,KLOR-CON) 10 MEQ tablet Take 1 tablet (10 mEq total) by mouth daily. 01/09/13  Yes [name], Peter M, MD  ranitidine (ZANTAC) 150 MG tablet Take 1 tablet (150 mg total) 2 (two) times daily by mouth. 12/20/16  Yes [name], MD  RESTASIS 0.05 % ophthalmic emulsion INSTILL 1 DROP INTO BOTH EYES TWICE A DAY 11/17/16  Yes [name], MD  traMADol (ULTRAM) 50 MG tablet TAKE 2 TABLETS BY MOUTH EVERY 8 HOURS AS NEEDED Patient taking differently: TAKE 100mg  BY MOUTH EVERY 8 HOURS AS NEEDED 08/02/16  Yes [name], MD    Current Facility-Administered Medications    Medication Dose Route Frequency Provider Last Rate Last Dose  . aspirin EC tablet 325 mg  325 mg Oral Daily PRN Theodis Blaze, MD      . calcium carbonate (TUMS - dosed in mg elemental calcium) chewable tablet 600 mg of elemental calcium  3 tablet Oral Daily PRN Theodis Blaze, MD      . carvedilol (COREG) tablet 18.75 mg  18.75 mg Oral BID WC Elodia Florence., MD      . carvedilol (COREG) tablet 6.25 mg  6.25 mg Oral Once Elodia Florence., MD      . cefTRIAXone (ROCEPHIN) 2 g in dextrose 5 % 50 mL IVPB  2 g Intravenous QHS Theodis Blaze, MD   Stopped at 02/07/17 0041  . cycloSPORINE (RESTASIS) 0.05 % ophthalmic emulsion 1 drop  1 drop Both Eyes BID Theodis Blaze, MD   1 drop at 02/07/17 0009  . famotidine (PEPCID) tablet 20 mg  20 mg Oral Daily Theodis Blaze, MD   20 mg at 02/07/17 0829  . fluticasone (FLONASE) 50 MCG/ACT nasal spray 2 spray  2 spray Each Nare Daily Theodis Blaze, MD      . furosemide (LASIX) tablet 40 mg  40 mg Oral Daily Theodis Blaze, MD   40 mg at 02/07/17 0829  . gabapentin (NEURONTIN) capsule 200 mg  200 mg Oral QHS Theodis Blaze, MD   200 mg at 02/07/17 0009  . lisinopril (PRINIVIL,ZESTRIL) tablet 10 mg  10 mg Oral Daily Theodis Blaze, MD   10 mg at 02/07/17 3016  . metroNIDAZOLE (FLAGYL) tablet 500 mg  500 mg Oral Q8H Elodia Florence., MD      . mometasone-formoterol [facility]) 200-5 MCG/ACT inhaler 2 puff  2 puff Inhalation BID Theodis Blaze, MD      . traMADol Veatrice Bourbon) tablet 50 mg  50 mg Oral Q6H PRN Theodis Blaze, MD   50 mg at 02/07/17 0109    Allergies as of 02/06/2017 - Review Complete 02/06/2017  Allergen Reaction Noted  . Celebrex [celecoxib] Hives and Other (See Comments) 07/14/2010  . Esomeprazole magnesium Hives and Other (See Comments) 07/14/2010  . Tamsulosin Other (See Comments) 08/22/2013  . Dronedarone Other (See Comments) 08/15/2014  . Digoxin Other (  See Comments)   . Hydrocodone-acetaminophen Other (See Comments)  11/11/2011  . Protonix [pantoprazole sodium] Nausea And Vomiting 09/25/2011     Review of Systems:    Constitutional: No weight loss, fever or chills Skin: No rash Cardiovascular: No chest pain Respiratory: No SOB Gastrointestinal: See HPI and otherwise negative Genitourinary: No dysuria  Neurological: No headache, dizziness or syncope Musculoskeletal: No new muscle or joint pain Hematologic: No bruising Psychiatric: No history of depression or anxiety   Physical Exam:  Vital signs in last 24 hours: Temp:  [97.7 F (36.5 C)-98.3 F (36.8 C)] 98 F (36.7 C) (01/08 0356) Pulse Rate:  [112-153] 117 (01/08 0356) Resp:  [18-25] 20 (01/07 2304) BP: (104-152)/(73-110) 125/73 (01/08 0356) SpO2:  [90 %-100 %] 96 % (01/08 0356) Weight:  [191 lb (86.6 kg)-192 lb 0.3 oz (87.1 kg)] 192 lb 0.3 oz (87.1 kg) (01/08 0612) Last BM Date: 02/06/17 General:   Pleasant Elderly Caucasian male appears to be in NAD, Well developed, Well nourished, alert and cooperative Head:  Normocephalic and atraumatic. Eyes:   PEERL, EOMI. No icterus. Conjunctiva pink. Ears:  Normal auditory acuity. Neck:  Supple Throat: Oral cavity and pharynx without inflammation, swelling or lesion.  Lungs: Respirations even and unlabored. Lungs clear to auscultation bilaterally.   No wheezes, crackles, or rhonchi.  Heart: Normal S1, S2. No MRG. Regular rate and rhythm. No peripheral edema, cyanosis or pallor.  Abdomen:  Soft, nondistended, nontender. No rebound or guarding. Normal bowel sounds. No appreciable masses or hepatomegaly. Rectal:  External: visualized mucosal tears near outside of rectum; Internal: minimal ttp, no mass, no discharge (did visualize toilet paper after patient wiped while on toilet, small amt of diluted brb streaking) Msk:  Symmetrical without gross deformities. Peripheral pulses intact.  Extremities:  Without edema, no deformity or joint abnormality. Normal ROM, normal sensation. Neurologic:  Alert  and  oriented x4;  grossly normal neurologically.  Skin:   Dry and intact without significant lesions or rashes. Psychiatric: Demonstrates good judgement and reason without abnormal affect or behaviors.   LAB RESULTS: Recent Labs    02/06/17 1039 02/07/17 0519  WBC 8.2 5.8  HGB 13.9 12.9*  HCT 41.8 39.5  PLT 143* 123*   BMET Recent Labs    02/06/17 1039 02/07/17 0519  NA 137 138  K 4.4 4.0  CL 102 107  CO2 24 23  GLUCOSE 107* 90  BUN 25* 22*  CREATININE 1.38* 1.18  CALCIUM 9.6 8.8*   LFT Recent Labs    02/06/17 1039  PROT 7.3  ALBUMIN 4.3  AST 36  ALT 14*  ALKPHOS 73  BILITOT 2.1*   STUDIES: Ct Abdomen Pelvis W Contrast  Result Date: 02/06/2017 CLINICAL DATA:  84 year old male with 2 days of abdominal pain and diarrhea. EXAM: CT ABDOMEN AND PELVIS WITH CONTRAST TECHNIQUE: Multidetector CT imaging of the abdomen and pelvis was performed using the standard protocol following bolus administration of intravenous contrast. CONTRAST:  183mL ISOVUE-300 IOPAMIDOL (ISOVUE-300) INJECTION 61% COMPARISON:  CT Abdomen and Pelvis 12/23/2016 and earlier. FINDINGS: Lower chest: Partially visible AICD leads. Stable cardiomegaly. No pericardial effusion. Stable lung base ventilation. Chronic lung base scarring and opacity, maximal in the medial basal segment of the right lower lobe where chronic round atelectasis is suspected. Unchanged trace right pleural fluid. Hepatobiliary: Surgically absent gallbladder. Stable liver including lobulated contour. No biliary ductal enlargement. Pancreas: Negative. Spleen: Stable lobulated spleen with benign calcification. Adrenals/Urinary Tract: Normal adrenal glands. Bilateral renal enhancement and contrast excretion is  symmetric and within normal limits. Chronic bilateral nephrolithiasis is stable measuring 5-6 mm in each lower pole. No acute perinephric stranding. Negative course of both ureters. Mildly to moderately distended but otherwise unremarkable  urinary bladder. Stomach/Bowel: Indistinct appearance of the rectum which appears to be fluid-filled but may be opacified due to mucosal thickening/inflammation (series 2, image 76). The sigmoid colon is decompressed. There is chronic proximal sigmoid diverticula without active inflammation. Decompressed descending colon. Negative transverse colon. Negative right colon and appendix (coronal image 44). Negative terminal ileum. Intermittent fluid and gas-filled small bowel loops, but no abnormally dilated small bowel. Decompressed stomach and duodenum. No abdominal free fluid or free air. Vascular/Lymphatic: Aortoiliac calcified atherosclerosis. Major arterial structures remain patent. Stable ectasia of the infrarenal abdominal aorta up to 25 mm diameter. Similar stable bilateral iliac artery ectasia. Portal venous system appears patent. No lymphadenopathy. Reproductive: Negative. Other: No pelvic free fluid, but mild presacral stranding since November (series 2, image 74). Musculoskeletal: Prior sternotomy. Stable visualized osseous structures. Chronic epicardial pacer leads in the left ventral abdominal wall are stable. IMPRESSION: 1. Possible acute proctitis, with presacral stranding and indistinct appearance of the rectum. No associated free fluid or complicating features. 2. Elsewhere the bowel appears stable and noninflamed. 3. No other acute finding identified in the abdomen or pelvis. 4. Chronic findings including right lower lobe round atelectasis, nephrolithiasis, Aortic Atherosclerosis (ICD10-I70.0) and Ectatic abdominal aorta at risk for aneurysm development. Recommend followup by Aortic Ultrasound in 5 years. This recommendation follows ACR consensus guidelines: White Paper of the ACR Incidental Findings Committee II on Vascular Findings. J Am Coll Radiol 2013; 10:789-794. Electronically Signed   By: Genevie Ann M.D.   On: 02/06/2017 13:46   PREVIOUS ENDOSCOPIES:            See HPI   Impression / Plan:     Impression: 1. Abnormal CT of pelvis: showing acute proctitis as above 2. Abdominal Pain: vague epigastric and lower abdominal pain with bloody diarrhea occasionally, FOBT+ stool 3. Chronic Systolic CHF: no recent echo 4. AICD 5. Afib with RVR 6. Epigastric pain: consider relation to gastritis, pt has been switched back and forth from PPI recently for fear of side effects  Plan: 1. Stool studies including GI path panel and CDiff are pending 2. Discussed case with Dr. Henrene Pastor, patient would be high risk for procedures given his heart failure. Proceeding with non-sedated flex sigmoidoscopy tomorrow with Dr. Henrene Pastor. Reviewed risks, benefits, limitations and alternatives and the patient agrees to proceed. 3. Patient can have clears today and NPO after midnight. He will get Fleets enemas x 2 prior to procedure. 4. Continue Pepcid as patient has listed allergies to PPI and is a very poor historian 5. Please await any further recommendations from Dr. Henrene Pastor later today  Thank you for your kind consultation, we will continue to follow.  Lavone Nian Lemmon  02/07/2017, 8:57 AM Pager #: [phone]  GI ATTENDING  History, laboratories, x-rays, prior endoscopy reports reviewed. Patient seen and examined. Patient has problems with recurrent abdominal pain seemingly exacerbated by meals. Other issues are loose stools, recurrent minor rectal bleeding, and abnormal CT scan showing "proctitis". Stool studies pending. Recommend acid suppressive therapy for possible acid peptic conditions. PPI daily would be optimal if there are no contraindications. He may have low flow state intestinal ischemia without critical vascular stenosis given the postprandial nature of his discomfort and the known cardiac history. Plan diagnostic flexible sigmoidoscopy without sedation (very high-risk patient) tomorrow to evaluate CT  abnormality, bleeding, and loose stools. This has been scheduled for 8:30 AM.  Docia Chuck. Geri Seminole.,  M.D. [facility] Division of Gastroenterology

## 2017-02-07 NOTE — H&P (View-Only) (Signed)
Consultation  Referring Provider:  Dr. Cephus Slater    Primary Care Physician:  [name], MD Primary Gastroenterologist:  Dr. Hilarie Fredrickson       Reason for Consultation: Rectal thickening on CT, rectal bleeding           HPI:   [name] is a 84 y.o. male with extensive past medical history as listed below including AFib (was on Coumadin-stopped in 2013 due to subdural hematoma), CHF (EF 35-40% in 2014) with ICD, CAD  S/p CABG in 1983 and stent in 2004 and DM, who presented to the ER on 02/06/17 with a complaint of abdominal pain.      Today, it should be noted that the patient is a VERY POOR historian.  He starts by telling me that he was feeling fine last week but started with some bright red blood streaking the toilet paper and looser bowel movements one day last week.  He is unsure what day. Later he tells me that actually he was not feeling good all week because "I stopped eating because my belly hurt".  Apparently he had some mixture of epigastric and lower abdominal pain which may have been exacerbated by eating. He describes chronic reflux, which is controlled if "I take that medicine". He also had some measure of change in bowel habits towards looser stool, though not that much more frequently and occasional brb streaking the toilet paper over the past week. He denies rectal pain.     Patient deneies fever, chills, nausea, vomiting, sick contacts, change in diet, change in medications or symptoms which awoke him from sleep.  ED Course: in ED, pt was hemodynamically stable, VS notable for HR up to 120's, blood work notable for Cr 1.39 which is pt's baseline. FOBT + and imaging studies concerning for acute proctitis.   Previous GI History: 12/16/13-OV, Alonza Bogus, PA-C: dysphagia and GERD-oropharyngeal-esophagram and speech path referral decline-continued on Dexilant 60mg  qd and carafate was added 07/07/11-Colonoscopy, Dr. Hilarie Fredrickson: Impression: 2 angioectasias in ascending colon ablated  with APC, 3 polyps in descending colon removed, sessile polyp in sigmoid colon removed and mild diverticulosis in the left colon; Pathology: adenomatous polyps-repeat recommended in 3 yrs 02/11/11-EGD, Dr. Benson Norway: Impression:?distal esophageal varix vs vascular bleb? Portal htn gastropathy  Past Medical History:  Diagnosis Date  . Abnormal thyroid scan    Abnormal thyroid imaging studies from 11/09/2010, status post ultrasound guided fine needle aspiration of the dominant left inferior thyroid nodule on 12/15/2010. Cytology report showed rare follicular epithelial cells and hemosiderin laden macrophages.  Marland Kitchen AICD (automatic cardioverter/defibrillator) present    fx lead   . Arthritis    "all over"  . Asthma   . Atrial fibrillation ([place])    on chronic Coumadin; stopped July 2013 due to subdural hematomas  . CHF (congestive heart failure) (HCC)    EF 35-40% s/p most recent ICD generator change-out with Medtronic dual-chamber ICD 05/20/11 with explantation of previous abdominally-implanted device  . Coronary artery disease    s/p CABG 1983 and PCI/stent 2004.   . Diabetes mellitus    diet controlled  . Dyslipidemia   . Erythrocytosis   . GERD (gastroesophageal reflux disease)   . Hypertension   . Ischemic cardiomyopathy    WITH CHF  . Monocytosis 04/17/2013  . Myocardial infarction ([place]) 1983; ~ 1990  . 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 Pneumonia August 2013  . Subdural hematoma [facility]) July 2013   Anticoagulation stopped.   Marland Kitchen  VT (ventricular tachycardia) ([place])   . Wears glasses     Past Surgical History:  Procedure Laterality Date  . CHOLECYSTECTOMY    . COLONOSCOPY  07/07/2011   Procedure: COLONOSCOPY;  Surgeon: Jerene Bears, MD;  Location: [facility];  Service: Gastroenterology;  Laterality: N/A;  . CORONARY ANGIOPLASTY WITH STENT PLACEMENT  2004   Tandem Cypher stents LAD  . CORONARY ARTERY BYPASS GRAFT  1983   SVG-mLAD  . ESOPHAGOGASTRODUODENOSCOPY  02/11/2011   Procedure:  ESOPHAGOGASTRODUODENOSCOPY (EGD);  Surgeon: Beryle Beams, MD;  Location: [facility];  Service: Endoscopy;  Laterality: N/A;  . ICD GENERATOR CHANGEOUT N/A 04/08/2016   Procedure: ICD Generator Changeout;  Surgeon: Evans Lance, MD;  Location: [facility];  Service: Cardiovascular;  Laterality: N/A;  . IMPLANTABLE CARDIOVERTER DEFIBRILLATOR (ICD) GENERATOR CHANGE N/A 05/20/2011   Procedure: ICD GENERATOR CHANGE;  Surgeon: Evans Lance, MD;  Medtronic secure dual-chamber ICD serial number [id number]   . KNEE ARTHROSCOPY     right; "just went in and scraped it"  . MASS EXCISION Right 05/10/2013   Procedure: EXCISION MASS RIGHT THUMB;  Surgeon: Wynonia Sours, MD;  Location: [facility];  Service: Orthopedics;  Laterality: Right;  . PROXIMAL INTERPHALANGEAL FUSION (PIP) Right 05/10/2013   Procedure: DEBRIDEMENT PROXIMAL INTERPHALANGEAL FUSION (PIP);  Surgeon: Wynonia Sours, MD;  Location: [place];  Service: Orthopedics;  Laterality: Right;  . TONSILLECTOMY          Family History  Problem Relation Age of Onset  . Tuberculosis Mother   . Tuberculosis Father   . Heart disease Brother   . Diabetes Sister   . Diabetes Brother   . Clotting disorder Brother     Social History   Tobacco Use  . Smoking status: Former Smoker    Packs/day: 2.00    Years: 30.00    Pack years: 60.00    Types: Cigarettes    Last attempt to quit: 06/23/1976    Years since quitting: 40.6  . Smokeless tobacco: Never Used  Substance Use Topics  . Alcohol use: No  . Drug use: No    Prior to Admission medications   Medication Sig Start Date End Date Taking? Authorizing Provider  ADVAIR DISKUS 250-50 MCG/DOSE AEPB Inhale 1 puff into the lungs 2 (two) times daily.  11/13/14  Yes [provider]  aspirin 81 MG tablet Take 81-325 mg daily as needed by mouth for headache.   Yes [provider]  calcium carbonate (TUMS - DOSED IN MG ELEMENTAL CALCIUM) 500 MG  chewable tablet Chew 3 tablets by mouth daily as needed for heartburn.    Yes [provider]  carvedilol (COREG) 12.5 MG tablet TAKE 1 TABLET (12.5 MG TOTAL) BY MOUTH 2 (TWO) TIMES DAILY. 12/05/16  Yes Evans Lance, MD  fluticasone (FLONASE) 50 MCG/ACT nasal spray Place 2 sprays into both nostrils daily. 01/04/17  Yes [name], MD  furosemide (LASIX) 40 MG tablet Take 1 tablet (40 mg total) by mouth daily. May take an extra tab daily as needed for swelling 01/04/17  Yes [name], MD  gabapentin (NEURONTIN) 100 MG capsule TAKE 1 CAPSULE (100MG ) TO 3 CAPSULES (300MG ) EACH NIGHT AS NEEDED FOR MUSCLE PAINS Patient taking differently: Take 200 mg at bedtime by mouth.  10/05/16  Yes [name], MD  LINZESS 72 MCG capsule Take 72 mcg by mouth daily. 03/07/16  Yes [provider]  lisinopril (PRINIVIL,ZESTRIL) 10  MG tablet TAKE 1 TABLET BY MOUTH EVERY DAY 12/26/16  Yes [name], Peter M, MD  loratadine (CLARITIN) 10 MG tablet Take 10 mg by mouth daily.    Yes [provider]  nitroGLYCERIN (NITROSTAT) 0.4 MG SL tablet PLACE ONE UNDER TONGUE FOR CHEST PAIN. 11/22/16  Yes [name], Peter M, MD  polyethylene glycol [facility] Floria Raveling) packet Take 17 g by mouth daily. Patient taking differently: Take 17 g daily as needed by mouth for mild constipation.  04/14/16  Yes Pfeiffer, Jeannie Done, MD  potassium chloride (K-DUR,KLOR-CON) 10 MEQ tablet Take 1 tablet (10 mEq total) by mouth daily. 01/09/13  Yes [name], Peter M, MD  ranitidine (ZANTAC) 150 MG tablet Take 1 tablet (150 mg total) 2 (two) times daily by mouth. 12/20/16  Yes [name], MD  RESTASIS 0.05 % ophthalmic emulsion INSTILL 1 DROP INTO BOTH EYES TWICE A DAY 11/17/16  Yes [name], MD  traMADol (ULTRAM) 50 MG tablet TAKE 2 TABLETS BY MOUTH EVERY 8 HOURS AS NEEDED Patient taking differently: TAKE 100mg  BY MOUTH EVERY 8 HOURS AS NEEDED 08/02/16  Yes [name], MD    Current Facility-Administered Medications    Medication Dose Route Frequency Provider Last Rate Last Dose  . aspirin EC tablet 325 mg  325 mg Oral Daily PRN Theodis Blaze, MD      . calcium carbonate (TUMS - dosed in mg elemental calcium) chewable tablet 600 mg of elemental calcium  3 tablet Oral Daily PRN Theodis Blaze, MD      . carvedilol (COREG) tablet 18.75 mg  18.75 mg Oral BID WC Elodia Florence., MD      . carvedilol (COREG) tablet 6.25 mg  6.25 mg Oral Once Elodia Florence., MD      . cefTRIAXone (ROCEPHIN) 2 g in dextrose 5 % 50 mL IVPB  2 g Intravenous QHS Theodis Blaze, MD   Stopped at 02/07/17 0041  . cycloSPORINE (RESTASIS) 0.05 % ophthalmic emulsion 1 drop  1 drop Both Eyes BID Theodis Blaze, MD   1 drop at 02/07/17 0009  . famotidine (PEPCID) tablet 20 mg  20 mg Oral Daily Theodis Blaze, MD   20 mg at 02/07/17 0829  . fluticasone (FLONASE) 50 MCG/ACT nasal spray 2 spray  2 spray Each Nare Daily Theodis Blaze, MD      . furosemide (LASIX) tablet 40 mg  40 mg Oral Daily Theodis Blaze, MD   40 mg at 02/07/17 0829  . gabapentin (NEURONTIN) capsule 200 mg  200 mg Oral QHS Theodis Blaze, MD   200 mg at 02/07/17 0009  . lisinopril (PRINIVIL,ZESTRIL) tablet 10 mg  10 mg Oral Daily Theodis Blaze, MD   10 mg at 02/07/17 9937  . metroNIDAZOLE (FLAGYL) tablet 500 mg  500 mg Oral Q8H Elodia Florence., MD      . mometasone-formoterol [facility]) 200-5 MCG/ACT inhaler 2 puff  2 puff Inhalation BID Theodis Blaze, MD      . traMADol Veatrice Bourbon) tablet 50 mg  50 mg Oral Q6H PRN Theodis Blaze, MD   50 mg at 02/07/17 1696    Allergies as of 02/06/2017 - Review Complete 02/06/2017  Allergen Reaction Noted  . Celebrex [celecoxib] Hives and Other (See Comments) 07/14/2010  . Esomeprazole magnesium Hives and Other (See Comments) 07/14/2010  . Tamsulosin Other (See Comments) 08/22/2013  . Dronedarone Other (See Comments) 08/15/2014  . Digoxin Other (  See Comments)   . Hydrocodone-acetaminophen Other (See Comments)  11/11/2011  . Protonix [pantoprazole sodium] Nausea And Vomiting 09/25/2011     Review of Systems:    Constitutional: No weight loss, fever or chills Skin: No rash Cardiovascular: No chest pain Respiratory: No SOB Gastrointestinal: See HPI and otherwise negative Genitourinary: No dysuria  Neurological: No headache, dizziness or syncope Musculoskeletal: No new muscle or joint pain Hematologic: No bruising Psychiatric: No history of depression or anxiety   Physical Exam:  Vital signs in last 24 hours: Temp:  [97.7 F (36.5 C)-98.3 F (36.8 C)] 98 F (36.7 C) (01/08 0356) Pulse Rate:  [112-153] 117 (01/08 0356) Resp:  [18-25] 20 (01/07 2304) BP: (104-152)/(73-110) 125/73 (01/08 0356) SpO2:  [90 %-100 %] 96 % (01/08 0356) Weight:  [191 lb (86.6 kg)-192 lb 0.3 oz (87.1 kg)] 192 lb 0.3 oz (87.1 kg) (01/08 0612) Last BM Date: 02/06/17 General:   Pleasant Elderly Caucasian male appears to be in NAD, Well developed, Well nourished, alert and cooperative Head:  Normocephalic and atraumatic. Eyes:   PEERL, EOMI. No icterus. Conjunctiva pink. Ears:  Normal auditory acuity. Neck:  Supple Throat: Oral cavity and pharynx without inflammation, swelling or lesion.  Lungs: Respirations even and unlabored. Lungs clear to auscultation bilaterally.   No wheezes, crackles, or rhonchi.  Heart: Normal S1, S2. No MRG. Regular rate and rhythm. No peripheral edema, cyanosis or pallor.  Abdomen:  Soft, nondistended, nontender. No rebound or guarding. Normal bowel sounds. No appreciable masses or hepatomegaly. Rectal:  External: visualized mucosal tears near outside of rectum; Internal: minimal ttp, no mass, no discharge (did visualize toilet paper after patient wiped while on toilet, small amt of diluted brb streaking) Msk:  Symmetrical without gross deformities. Peripheral pulses intact.  Extremities:  Without edema, no deformity or joint abnormality. Normal ROM, normal sensation. Neurologic:  Alert  and  oriented x4;  grossly normal neurologically.  Skin:   Dry and intact without significant lesions or rashes. Psychiatric: Demonstrates good judgement and reason without abnormal affect or behaviors.   LAB RESULTS: Recent Labs    02/06/17 1039 02/07/17 0519  WBC 8.2 5.8  HGB 13.9 12.9*  HCT 41.8 39.5  PLT 143* 123*   BMET Recent Labs    02/06/17 1039 02/07/17 0519  NA 137 138  K 4.4 4.0  CL 102 107  CO2 24 23  GLUCOSE 107* 90  BUN 25* 22*  CREATININE 1.38* 1.18  CALCIUM 9.6 8.8*   LFT Recent Labs    02/06/17 1039  PROT 7.3  ALBUMIN 4.3  AST 36  ALT 14*  ALKPHOS 73  BILITOT 2.1*   STUDIES: Ct Abdomen Pelvis W Contrast  Result Date: 02/06/2017 CLINICAL DATA:  84 year old male with 2 days of abdominal pain and diarrhea. EXAM: CT ABDOMEN AND PELVIS WITH CONTRAST TECHNIQUE: Multidetector CT imaging of the abdomen and pelvis was performed using the standard protocol following bolus administration of intravenous contrast. CONTRAST:  117mL ISOVUE-300 IOPAMIDOL (ISOVUE-300) INJECTION 61% COMPARISON:  CT Abdomen and Pelvis 12/23/2016 and earlier. FINDINGS: Lower chest: Partially visible AICD leads. Stable cardiomegaly. No pericardial effusion. Stable lung base ventilation. Chronic lung base scarring and opacity, maximal in the medial basal segment of the right lower lobe where chronic round atelectasis is suspected. Unchanged trace right pleural fluid. Hepatobiliary: Surgically absent gallbladder. Stable liver including lobulated contour. No biliary ductal enlargement. Pancreas: Negative. Spleen: Stable lobulated spleen with benign calcification. Adrenals/Urinary Tract: Normal adrenal glands. Bilateral renal enhancement and contrast excretion is  symmetric and within normal limits. Chronic bilateral nephrolithiasis is stable measuring 5-6 mm in each lower pole. No acute perinephric stranding. Negative course of both ureters. Mildly to moderately distended but otherwise unremarkable  urinary bladder. Stomach/Bowel: Indistinct appearance of the rectum which appears to be fluid-filled but may be opacified due to mucosal thickening/inflammation (series 2, image 76). The sigmoid colon is decompressed. There is chronic proximal sigmoid diverticula without active inflammation. Decompressed descending colon. Negative transverse colon. Negative right colon and appendix (coronal image 44). Negative terminal ileum. Intermittent fluid and gas-filled small bowel loops, but no abnormally dilated small bowel. Decompressed stomach and duodenum. No abdominal free fluid or free air. Vascular/Lymphatic: Aortoiliac calcified atherosclerosis. Major arterial structures remain patent. Stable ectasia of the infrarenal abdominal aorta up to 25 mm diameter. Similar stable bilateral iliac artery ectasia. Portal venous system appears patent. No lymphadenopathy. Reproductive: Negative. Other: No pelvic free fluid, but mild presacral stranding since November (series 2, image 74). Musculoskeletal: Prior sternotomy. Stable visualized osseous structures. Chronic epicardial pacer leads in the left ventral abdominal wall are stable. IMPRESSION: 1. Possible acute proctitis, with presacral stranding and indistinct appearance of the rectum. No associated free fluid or complicating features. 2. Elsewhere the bowel appears stable and noninflamed. 3. No other acute finding identified in the abdomen or pelvis. 4. Chronic findings including right lower lobe round atelectasis, nephrolithiasis, Aortic Atherosclerosis (ICD10-I70.0) and Ectatic abdominal aorta at risk for aneurysm development. Recommend followup by Aortic Ultrasound in 5 years. This recommendation follows ACR consensus guidelines: White Paper of the ACR Incidental Findings Committee II on Vascular Findings. J Am Coll Radiol 2013; 10:789-794. Electronically Signed   By: Genevie Ann M.D.   On: 02/06/2017 13:46   PREVIOUS ENDOSCOPIES:            See HPI   Impression / Plan:     Impression: 1. Abnormal CT of pelvis: showing acute proctitis as above 2. Abdominal Pain: vague epigastric and lower abdominal pain with bloody diarrhea occasionally, FOBT+ stool 3. Chronic Systolic CHF: no recent echo 4. AICD 5. Afib with RVR 6. Epigastric pain: consider relation to gastritis, pt has been switched back and forth from PPI recently for fear of side effects  Plan: 1. Stool studies including GI path panel and CDiff are pending 2. Discussed case with Dr. Henrene Pastor, patient would be high risk for procedures given his heart failure. Proceeding with non-sedated flex sigmoidoscopy tomorrow with Dr. Henrene Pastor. Reviewed risks, benefits, limitations and alternatives and the patient agrees to proceed. 3. Patient can have clears today and NPO after midnight. He will get Fleets enemas x 2 prior to procedure. 4. Continue Pepcid as patient has listed allergies to PPI and is a very poor historian 5. Please await any further recommendations from Dr. Henrene Pastor later today  Thank you for your kind consultation, we will continue to follow.  Lavone Nian Lemmon  02/07/2017, 8:57 AM Pager #: [phone]  GI ATTENDING  History, laboratories, x-rays, prior endoscopy reports reviewed. Patient seen and examined. Patient has problems with recurrent abdominal pain seemingly exacerbated by meals. Other issues are loose stools, recurrent minor rectal bleeding, and abnormal CT scan showing "proctitis". Stool studies pending. Recommend acid suppressive therapy for possible acid peptic conditions. PPI daily would be optimal if there are no contraindications. He may have low flow state intestinal ischemia without critical vascular stenosis given the postprandial nature of his discomfort and the known cardiac history. Plan diagnostic flexible sigmoidoscopy without sedation (very high-risk patient) tomorrow to evaluate CT  abnormality, bleeding, and loose stools. This has been scheduled for 8:30 AM.  Docia Chuck. Geri Seminole.,  M.D. [facility] Division of Gastroenterology

## 2017-02-08 ENCOUNTER — Encounter (HOSPITAL_COMMUNITY): Payer: Self-pay

## 2017-02-08 ENCOUNTER — Encounter (HOSPITAL_COMMUNITY)
Admission: EM | Disposition: A | Discharge status: Home Health | Payer: Self-pay | Source: Home / Self Care | Attending: Internal Medicine

## 2017-02-08 DIAGNOSIS — K626 Ulcer of anus and rectum: Secondary | ICD-10-CM

## 2017-02-08 DIAGNOSIS — R531 Weakness: Secondary | ICD-10-CM

## 2017-02-08 DIAGNOSIS — K625 Hemorrhage of anus and rectum: Secondary | ICD-10-CM

## 2017-02-08 HISTORY — PX: FLEXIBLE SIGMOIDOSCOPY: SHX5431

## 2017-02-08 LAB — BASIC METABOLIC PANEL
ANION GAP: 9 (ref 5–15)
BUN: 17 mg/dL (ref 6–20)
CALCIUM: 8.6 mg/dL — AB (ref 8.9–10.3)
CO2: 24 mmol/L (ref 22–32)
Chloride: 105 mmol/L (ref 101–111)
Creatinine, Ser: 1.18 mg/dL (ref 0.61–1.24)
GFR, EST NON AFRICAN AMERICAN: 56 mL/min — AB (ref >60)
Glucose, Bld: 99 mg/dL (ref 65–99)
POTASSIUM: 3.3 mmol/L — AB (ref 3.5–5.1)
Sodium: 138 mmol/L (ref 135–145)

## 2017-02-08 LAB — URINE CULTURE: Culture: <10000 — AB

## 2017-02-08 LAB — CBC
HCT: 37.8 % — ABNORMAL LOW (ref 39.0–52.0)
Hemoglobin: 12.6 g/dL — ABNORMAL LOW (ref 13.0–17.0)
MCH: 29.6 pg (ref 26.0–34.0)
MCHC: 33.3 g/dL (ref 30.0–36.0)
MCV: 88.7 fL (ref 78.0–100.0)
PLATELETS: 113 10*3/uL — AB (ref 150–400)
RBC: 4.26 MIL/uL (ref 4.22–5.81)
RDW: 15.2 % (ref 11.5–15.5)
WBC: 5.4 10*3/uL (ref 4.0–10.5)

## 2017-02-08 LAB — MAGNESIUM: MAGNESIUM: 1.7 mg/dL (ref 1.7–2.4)

## 2017-02-08 SURGERY — SIGMOIDOSCOPY, FLEXIBLE

## 2017-02-08 MED ORDER — SODIUM CHLORIDE 0.9 % IV SOLN
INTRAVENOUS | Status: DC
  Administered 2017-02-08: 06:00:00 via INTRAVENOUS

## 2017-02-08 MED ORDER — OMEPRAZOLE 20 MG PO CPDR
40.0000 mg | DELAYED_RELEASE_CAPSULE | Freq: Once | ORAL | Status: AC
  Administered 2017-02-08: 40 mg via ORAL
  Filled 2017-02-08: qty 2

## 2017-02-08 MED ORDER — METOPROLOL TARTRATE 50 MG PO TABS
50.0000 mg | ORAL_TABLET | Freq: Two times a day (BID) | ORAL | Status: DC
  Administered 2017-02-08: 50 mg via ORAL
  Filled 2017-02-08 (×3): qty 1

## 2017-02-08 MED ORDER — POTASSIUM CHLORIDE CRYS ER 20 MEQ PO TBCR
40.0000 meq | EXTENDED_RELEASE_TABLET | Freq: Once | ORAL | Status: AC
  Administered 2017-02-08: 40 meq via ORAL
  Filled 2017-02-08: qty 2

## 2017-02-08 MED ORDER — ASPIRIN EC 81 MG PO TBEC
81.0000 mg | DELAYED_RELEASE_TABLET | Freq: Every day | ORAL | Status: DC
  Administered 2017-02-08 – 2017-02-10 (×3): 81 mg via ORAL
  Filled 2017-02-08 (×3): qty 1

## 2017-02-08 NOTE — Plan of Care (Signed)
Cont to mon 

## 2017-02-08 NOTE — Evaluation (Signed)
Physical Therapy Evaluation Patient Details Name: [name] MRN: [id number] DOB: Feb 16, 1932 Today's Date: 02/08/2017   History of Present Illness  Pt admitted by ambulance 2* abdominal pain.  Pt with hx of CHF, a-fib, CABG and ischemic cardiomyopathy  Clinical Impression  Pt admitted as above and presenting with functional mobility limitations 2* generalized weakness, poor endurance and ambulatory balance deficits.  Pt hopes to progress to dc home but may need to consider short term SNF level rehab to maximize IND and safety prior to return home with no assistance.  This date, pt able to ambulate 73ft with min assist before c/o increasing fatigue "I feel like I just ran a marathon".  Pt HR elevated to 160+, BP 115/69 and SaO2 98%.    Follow Up Recommendations Home health PT;SNF(Dependent on acute stay progress)    Equipment Recommendations  None recommended by PT    Recommendations for Other Services OT consult     Precautions / Restrictions Precautions Precautions: Fall Restrictions Weight Bearing Restrictions: No      Mobility  Bed Mobility Overal bed mobility: Modified Independent             General bed mobility comments: With increased time but unassisted  Transfers Overall transfer level: Needs assistance   Transfers: Sit to/from Stand Sit to Stand: Min guard         General transfer comment: steady assist only  Ambulation/Gait Ambulation/Gait assistance: Min assist;Min guard Ambulation Distance (Feet): 40 Feet Assistive device: Straight cane Gait Pattern/deviations: Step-through pattern;Decreased step length - right;Decreased step length - left;Shuffle;Trunk flexed Gait velocity: decr Gait velocity interpretation: Below normal speed for age/gender General Gait Details: mild instability ambulating with cane.  Will attempt rollator next visit.  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     Modified Rankin (Stroke Patients Only)       Balance Overall balance assessment: Needs assistance Sitting-balance support: No upper extremity supported;Feet supported Sitting balance-Leahy Scale: Good     Standing balance support: No upper extremity supported Standing balance-Leahy Scale: Fair                               Pertinent Vitals/Pain Pain Assessment: No/denies pain    Home Living Family/patient expects to be discharged to:: Private residence Living Arrangements: Alone   Type of Home: House Home Access: Stairs to enter   [employer] of Steps: 1 Home Layout: One level Home Equipment: Environmental consultant - 2 wheels;Walker - 4 wheels;Cane - single point      Prior Function Level of Independence: Independent;Independent with assistive device(s)         Comments: Pt reports able to to perform basic ADLS and self care but could use some help with house keeping     Hand Dominance        Extremity/Trunk Assessment   Upper Extremity Assessment Upper Extremity Assessment: Generalized weakness    Lower Extremity Assessment Lower Extremity Assessment: Generalized weakness       Communication   Communication: No difficulties  Cognition Arousal/Alertness: Awake/alert Behavior During Therapy: WFL for tasks assessed/performed Overall Cognitive Status: Within Functional Limits for tasks assessed  General Comments      Exercises     Assessment/Plan    PT Assessment Patient needs continued PT services  PT Problem List Decreased strength;Decreased activity tolerance;Decreased balance;Decreased mobility;Decreased knowledge of use of DME       PT Treatment Interventions DME instruction;Gait training;Stair training;Functional mobility training;Therapeutic activities;Therapeutic exercise;Balance training;Patient/family education    PT Goals (Current goals can be found in the Care Plan  section)  Acute Rehab PT Goals Patient Stated Goal: Regain strength PT Goal Formulation: With patient Time For Goal Achievement: 02/22/17 Potential to Achieve Goals: Fair    Frequency Min 3X/week   Barriers to discharge Decreased caregiver support Pt lives alone and states he has no assistance    Co-evaluation               AM-PAC PT "6 Clicks" Daily Activity  Outcome Measure Difficulty turning over in bed (including adjusting bedclothes, sheets and blankets)?: A Little Difficulty moving from lying on back to sitting on the side of the bed? : A Little Difficulty sitting down on and standing up from a chair with arms (e.g., wheelchair, bedside commode, etc,.)?: A Little Help needed moving to and from a bed to chair (including a wheelchair)?: A Little Help needed walking in hospital room?: A Little Help needed climbing 3-5 steps with a railing? : A Lot 6 Click Score: 17    End of Session Equipment Utilized During Treatment: Gait belt Activity Tolerance: Patient limited by fatigue Patient left: in chair;with call bell/phone within reach Nurse Communication: Mobility status PT Visit Diagnosis: Unsteadiness on feet (R26.81);Muscle weakness (generalized) (M62.81);Difficulty in walking, not elsewhere classified (R26.2)    Time: 6962-9528 PT Time Calculation (min) (ACUTE ONLY): 21 min   Charges:   PT Evaluation $PT Eval Low Complexity: 1 Low     PT G Codes:        Pg [phone]   [name] 02/08/2017, 5:02 PM

## 2017-02-08 NOTE — Progress Notes (Addendum)
PROGRESS NOTE  [name]  GMW:[id number] DOB: 1932-04-26 DOA: 02/06/2017 PCP: [name], MD  Brief Narrative:   [name] a 84 y.o.malewith medical history significant ofa-fib(was on Coumadin but this was stopped in 2013 due to subdural hematoma), pt follows with Dr. Crissie Sickles, AICD,chronic systolicCHF EF 44-03%, CAD, DM, HLD, presented with sudden onset severe epigastric and lower quadrants abd pain associated with mixed episodes of bloody and non bloody diarrhea.  Patient underwent flexible sigmoidoscopy on 1/9 which revealed a 6 mm ulcer that did not have the stigmata of bleeding.  Despite his bloody stools ceasing and abdominal pain improving, he has been very weak and having frequent A. fib with RVR.  He attempted to work with physical therapy but became very weak after walking only 40 feet and had to be placed in a chair and wheeled back to his room.  His heart rate got up to the 160s and took 20-30 minutes before trended down to the 120s.    Assessment & Plan:  Abdominal Pain, Bloody Diarrhea, due to rectal ulcer:  CT with presacral stranding and indistinct appearance of rectum.  Pain continues to improve.   -  Continue Ceftriaxone, will add on flagyl -  GI path panel negative -  Having formed stool -  D/c enteric precautions -  Continue PPI daily  UTI: UA with + WBC, RBC's, neg nitrite.  Cx insignificant growth. Continue ceftriaxone.  Chronic Systolic HF EF 47-42% Continue carvdedilol Continue lasix Continue lisinopril Telemetry  CAD, chest pain free -  Continue BB, ACEI, statin -  Resume aspirin  Hx of VT, s/p AICD placement  Afib with RVR, HR persistent in 110s-120s and increases to 160s with minimal exertion - paged Dr. Rayann Heman, EP for assistance with management, awaiting call back -  Do not want to start dilt or CCB due to sHF -  Patient previously did not tolerate amiodarone -  Could consider metoprolol +/- dig -  no coumadin after  subdural hematoma  Thrombocytopenia:  - continue to monitor  CKD stage III: creatinine below baseline  Elevated Bilirubin: no gallbladder, no ductal dilatiation.  Ctm.  Ectatic Abdominal Aorta: f/u US recommended in 5 years  Generalized weakness -  PT evaluation  DVT prophylaxis: SCD Code Status: full  Family Communication: none at bedside Disposition Plan:  may need SNF, PT evaluation pending.  HR elevated again.      Consultants:   GI  Procedures:   Flex sig    Antimicrobials:  Anti-infectives (From admission, onward)   Start     Dose/Rate Route Frequency Ordered Stop   02/07/17 0830  metroNIDAZOLE (FLAGYL) tablet 500 mg     500 mg Oral Every 8 hours 02/07/17 0828     02/06/17 2315  cefTRIAXone (ROCEPHIN) 2 g in dextrose 5 % 50 mL IVPB     2 g 100 mL/hr over 30 Minutes Intravenous Daily at bedtime 02/06/17 2314         Subjective:  Feeling tired/not rested and overall not very good.  Feels a little weak.    Objective: Vitals:   02/08/17 0826 02/08/17 0856 02/08/17 0918 02/08/17 1317  BP: 118/71 (!) 112/58 115/67 114/72  Pulse: (!) 106  (!) 118 (!) 113  Resp: 20     Temp: (!) 97.2 F (36.2 C)  97.8 F (36.6 C) 97.7 F (36.5 C)  TempSrc: Oral  Oral Oral  SpO2: 97%  96% 97%  Weight:  Height:        Intake/Output Summary (Last 24 hours) at 02/08/2017 1621 Last data filed at 02/08/2017 1100 Gross per 24 hour  Intake 170 ml  Output -  Net 170 ml   Filed Weights   02/06/17 0941 02/06/17 2304 02/07/17 0612  Weight: 86.6 kg (191 lb) 87.1 kg (192 lb 0.3 oz) 87.1 kg (192 lb 0.3 oz)    Examination:  General exam:  Adult male.  No acute distress.  HEENT:  NCAT, MMM Respiratory system: Clear to auscultation bilaterally Cardiovascular system: IRRR and tachycardic, normal S1/S2. No murmurs, rubs, gallops or clicks.  Warm extremities Gastrointestinal system: Normal active bowel sounds, soft, nondistended, nontender. MSK:  Normal tone and bulk,  no lower extremity edema Neuro:  Grossly intact    Data Reviewed: I have personally reviewed following labs and imaging studies  CBC: Recent Labs  Lab 02/06/17 1039 02/07/17 0519 02/08/17 0503  WBC 8.2 5.8 5.4  NEUTROABS 3.9  --   --   HGB 13.9 12.9* 12.6*  HCT 41.8 39.5 37.8*  MCV 88.7 89.2 88.7  PLT 143* 123* 502*   Basic Metabolic Panel: Recent Labs  Lab 02/06/17 1039 02/07/17 0519 02/08/17 0503  NA 137 138 138  K 4.4 4.0 3.3*  CL 102 107 105  CO2 24 23 24   GLUCOSE 107* 90 99  BUN 25* 22* 17  CREATININE 1.38* 1.18 1.18  CALCIUM 9.6 8.8* 8.6*  MG  --   --  1.7   GFR: Estimated Creatinine Clearance: 53 mL/min (by C-G formula based on SCr of 1.18 mg/dL). Liver Function Tests: Recent Labs  Lab 02/06/17 1039  AST 36  ALT 14*  ALKPHOS 73  BILITOT 2.1*  PROT 7.3  ALBUMIN 4.3   Recent Labs  Lab 02/06/17 1039  LIPASE 27   No results for input(s): AMMONIA in the last 168 hours. Coagulation Profile: No results for input(s): INR, PROTIME in the last 168 hours. Cardiac Enzymes: No results for input(s): CKTOTAL, CKMB, CKMBINDEX, TROPONINI in the last 168 hours. BNP (last 3 results) No results for input(s): PROBNP in the last 8760 hours. HbA1C: No results for input(s): HGBA1C in the last 72 hours. CBG: No results for input(s): GLUCAP in the last 168 hours. Lipid Profile: No results for input(s): CHOL, HDL, LDLCALC, TRIG, CHOLHDL, LDLDIRECT in the last 72 hours. Thyroid Function Tests: Recent Labs    02/06/17 2315  TSH 0.553   Anemia Panel: No results for input(s): VITAMINB12, FOLATE, FERRITIN, TIBC, IRON, RETICCTPCT in the last 72 hours. Urine analysis:    Component Value Date/Time   COLORURINE YELLOW 02/06/2017 1128   APPEARANCEUR HAZY (A) 02/06/2017 1128   LABSPEC 1.018 02/06/2017 1128   PHURINE 5.0 02/06/2017 1128   GLUCOSEU NEGATIVE 02/06/2017 1128   HGBUR SMALL (A) 02/06/2017 1128   BILIRUBINUR NEGATIVE 02/06/2017 1128   KETONESUR 20 (A)  02/06/2017 1128   PROTEINUR 30 (A) 02/06/2017 1128   UROBILINOGEN 0.2 09/09/2012 1501   NITRITE NEGATIVE 02/06/2017 1128   LEUKOCYTESUR MODERATE (A) 02/06/2017 1128   Sepsis Labs: @LABRCNTIP (procalcitonin:4,lacticidven:4)  ) Recent Results (from the past 240 hour(s))  Urine culture     Status: Abnormal   Collection Time: 02/07/17 12:39 AM  Result Value Ref Range Status   Specimen Description URINE, RANDOM  Final   Special Requests NONE  Final   Culture (A)  Final    <10,000 COLONIES/mL INSIGNIFICANT GROWTH Performed at [facility], [place] [address], [place], [place] [address]  Report Status 02/08/2017 FINAL  Final  Gastrointestinal Panel by PCR , Stool     Status: None   Collection Time: 02/07/17 12:39 AM  Result Value Ref Range Status   Campylobacter species NOT DETECTED NOT DETECTED Final   Plesimonas shigelloides NOT DETECTED NOT DETECTED Final   Salmonella species NOT DETECTED NOT DETECTED Final   Yersinia enterocolitica NOT DETECTED NOT DETECTED Final   Vibrio species NOT DETECTED NOT DETECTED Final   Vibrio cholerae NOT DETECTED NOT DETECTED Final   Enteroaggregative E coli (EAEC) NOT DETECTED NOT DETECTED Final   Enteropathogenic E coli (EPEC) NOT DETECTED NOT DETECTED Final   Enterotoxigenic E coli (ETEC) NOT DETECTED NOT DETECTED Final   Shiga like toxin producing E coli (STEC) NOT DETECTED NOT DETECTED Final   Shigella/Enteroinvasive E coli (EIEC) NOT DETECTED NOT DETECTED Final   Cryptosporidium NOT DETECTED NOT DETECTED Final   Cyclospora cayetanensis NOT DETECTED NOT DETECTED Final   Entamoeba histolytica NOT DETECTED NOT DETECTED Final   Giardia lamblia NOT DETECTED NOT DETECTED Final   Adenovirus F40/41 NOT DETECTED NOT DETECTED Final   Astrovirus NOT DETECTED NOT DETECTED Final   Norovirus GI/GII NOT DETECTED NOT DETECTED Final   Rotavirus A NOT DETECTED NOT DETECTED Final   Sapovirus (I, II, IV, and V) NOT DETECTED NOT DETECTED Final    Comment:  Performed at [facility], [address] [place], [place] [address]  MRSA PCR Screening     Status: None   Collection Time: 02/07/17  2:03 AM  Result Value Ref Range Status   MRSA by PCR NEGATIVE NEGATIVE Final    Comment:        The GeneXpert MRSA Assay (FDA approved for NASAL specimens only), is one component of a comprehensive MRSA colonization surveillance program. It is not intended to diagnose MRSA infection nor to guide or monitor treatment for MRSA infections.       Radiology Studies: No results found.   Scheduled Meds: . cycloSPORINE  1 drop Both Eyes BID  . famotidine  20 mg Oral Daily  . fluticasone  2 spray Each Nare Daily  . furosemide  40 mg Oral Daily  . gabapentin  200 mg Oral QHS  . lisinopril  10 mg Oral Daily  . metoprolol tartrate  50 mg Oral BID  . metroNIDAZOLE  500 mg Oral Q8H  . mometasone-formoterol  2 puff Inhalation BID   Continuous Infusions: . cefTRIAXone (ROCEPHIN)  IV Stopped (02/07/17 2228)     LOS: 2 days    Time spent: 30 min    Janece Canterbury, MD [facility] Pager [phone]  If 7PM-7AM, please contact night-coverage [email or web address] Password [email or web address] 02/08/2017, 4:21 PM

## 2017-02-08 NOTE — Progress Notes (Signed)
Spoke with PT about seeing patient today. Pt does not have therapist assigned.

## 2017-02-08 NOTE — Interval H&P Note (Signed)
History and Physical Interval Note:  02/08/2017 8:42 AM  [name]  has presented today for surgery, with the diagnosis of Hemaotchezia, Abnormal Ct Abdomen/Pelvis  The various methods of treatment have been discussed with the patient and family. After consideration of risks, benefits and other options for treatment, the patient has consented to  Procedure(s): FLEXIBLE SIGMOIDOSCOPY (N/A) as a surgical intervention .  The patient's history has been reviewed, patient examined, no change in status, stable for surgery.  I have reviewed the patient's chart and labs.  Questions were answered to the patient's satisfaction.     Scarlette Shorts

## 2017-02-08 NOTE — Op Note (Signed)
[facility] Patient Name: [name] Procedure Date: 02/08/2017 MRN: [id number] Attending MD: [name] , MD Date of Birth: 05/29/1932 CSN: [id number] Age: 84 Admit Type: Inpatient Procedure:                Flexible Sigmoidoscopy Indications:              Rectal hemorrhage, Abnormal CT of the GI tract Providers:                Joshua Chuck. Henrene Pastor, MD, Joshua Daub, RN, Joshua Raddle, RN, [name], Technician Referring MD:             [name] Medicines:                None Complications:            No immediate complications. Estimated Blood Loss:     Estimated blood loss: none. Procedure:                Pre-Anesthesia Assessment:                           - Prior to the procedure, a History and Physical                            was performed, and patient medications and                            allergies were reviewed. The patient's tolerance of                            previous anesthesia was also reviewed. The risks                            and benefits of the procedure and the sedation                            options and risks were discussed with the patient.                            All questions were answered, and informed consent                            was obtained. Prior Anticoagulants: The patient has                            taken no previous anticoagulant or antiplatelet                            agents. ASA Grade Assessment: III - A patient with                            severe systemic disease. After reviewing the risks  and benefits, the patient was deemed in                            satisfactory condition to undergo the procedure.                           After obtaining informed consent, the scope was                            passed under direct vision. The EC-[id number] ([id number])                            scope was introduced through the anus and advanced                             to the the sigmoid colon. The flexible                            sigmoidoscopy was accomplished without difficulty.                            The patient tolerated the procedure well. The                            quality of the bowel preparation was excellent. Scope In: Scope Out: Findings:      The perianal and digital rectal examinations were normal.      A single (solitary) six mm ulcer was found in the distal rectum. No       bleeding was present. No stigmata of recent bleeding were seen.      The sigmoid colon appeared normal. Impression:               - A single (solitary) ulcer in the distal rectum.                           - The sigmoid colon is normal.                           - Abdominal exam benign. Patient denying any                            abdominal complaints. Moderate Sedation:      none Recommendation:           1. Resume diet                           2. Prescribe omeprazole 40 mg daily for adequate                            treatment of his GERD. He tells me that he has had                            Prilosec in the past without problems.  3. Solitary rectal ulcer will heal on its own. Keep                            Bowels regular with stool softeners or fiber                           4. Okay for discharge from GI standpoint if                            tolerating diet without problems                           5. Outpatient GI follow-up on an as-needed basis                            with Dr. Verlan [name] WITH PATIENT. Procedure report provided                            to him. GI will sign off. Thank you Procedure Code(s):        --- Professional ---                           [phone], Sigmoidoscopy, flexible; diagnostic,                            including collection of specimen(s) by brushing or                            washing, when performed (separate  procedure) Diagnosis Code(s):        --- Professional ---                           K62.6, Ulcer of anus and rectum                           K62.5, Hemorrhage of anus and rectum                           R93.3, Abnormal findings on diagnostic imaging of                            other parts of digestive tract CPT copyright 2016 [employer]. All rights reserved. The codes documented in this report are preliminary and upon coder review may  be revised to meet current compliance requirements. Joshua Chuck. Henrene Pastor, MD 02/08/2017 9:04:32 AM This report has been signed electronically. Number of Addenda: 0

## 2017-02-09 ENCOUNTER — Encounter (HOSPITAL_COMMUNITY): Payer: Self-pay | Admitting: Internal Medicine

## 2017-02-09 DIAGNOSIS — I482 Chronic atrial fibrillation: Secondary | ICD-10-CM

## 2017-02-09 LAB — BASIC METABOLIC PANEL
Anion gap: 10 (ref 5–15)
BUN: 14 mg/dL (ref 6–20)
CHLORIDE: 104 mmol/L (ref 101–111)
CO2: 24 mmol/L (ref 22–32)
Calcium: 8.8 mg/dL — ABNORMAL LOW (ref 8.9–10.3)
Creatinine, Ser: 1.27 mg/dL — ABNORMAL HIGH (ref 0.61–1.24)
GFR calc non Af Amer: 51 mL/min — ABNORMAL LOW (ref >60)
GFR, EST AFRICAN AMERICAN: 59 mL/min — AB (ref >60)
Glucose, Bld: 112 mg/dL — ABNORMAL HIGH (ref 65–99)
POTASSIUM: 3.3 mmol/L — AB (ref 3.5–5.1)
SODIUM: 138 mmol/L (ref 135–145)

## 2017-02-09 LAB — CBC
HCT: 40.3 % (ref 39.0–52.0)
Hemoglobin: 13.4 g/dL (ref 13.0–17.0)
MCH: 29.6 pg (ref 26.0–34.0)
MCHC: 33.3 g/dL (ref 30.0–36.0)
MCV: 89.2 fL (ref 78.0–100.0)
Platelets: 132 10*3/uL — ABNORMAL LOW (ref 150–400)
RBC: 4.52 MIL/uL (ref 4.22–5.81)
RDW: 15.3 % (ref 11.5–15.5)
WBC: 6.2 10*3/uL (ref 4.0–10.5)

## 2017-02-09 MED ORDER — CARVEDILOL 25 MG PO TABS: 25.0000 mg | ORAL_TABLET | Freq: Two times a day (BID) | ORAL | Status: DC

## 2017-02-09 MED ORDER — POTASSIUM CHLORIDE CRYS ER 20 MEQ PO TBCR
40.0000 meq | EXTENDED_RELEASE_TABLET | Freq: Once | ORAL | Status: AC
  Administered 2017-02-09: 40 meq via ORAL
  Filled 2017-02-09: qty 2

## 2017-02-09 MED ORDER — METOPROLOL SUCCINATE ER 25 MG PO TB24
25.0000 mg | ORAL_TABLET | Freq: Every day | ORAL | Status: DC
  Administered 2017-02-09 – 2017-02-10 (×2): 25 mg via ORAL
  Filled 2017-02-09 (×2): qty 1

## 2017-02-09 MED ORDER — DIGOXIN 0.25 MG/ML IJ SOLN
0.2500 mg | Freq: Every day | INTRAMUSCULAR | Status: DC
Start: 2017-02-09 — End: 2017-02-10
  Administered 2017-02-09: 0.25 mg via INTRAVENOUS
  Filled 2017-02-09 (×3): qty 1

## 2017-02-09 NOTE — Progress Notes (Addendum)
PROGRESS NOTE  [name]  JOA:[id number] DOB: 11-Mar-1932 DOA: 02/06/2017 PCP: [name], MD  Brief Narrative:   Joshua Grinder Ludwickis a 84 y.o.malewith medical history significant ofa-fib(was on Coumadin but this was stopped in 2013 due to subdural hematoma), pt follows with Dr. Crissie Sickles, AICD,chronic systolicCHF EF 60-10%, CAD, DM, HLD, presented with sudden onset severe epigastric and lower quadrants abd pain associated with mixed episodes of bloody and non bloody diarrhea.  Patient underwent flexible sigmoidoscopy on 1/9 which revealed a 6 mm ulcer that did not have the stigmata of bleeding.  Despite his bloody stools ceasing and abdominal pain improving, he has been very weak and having frequent A. fib with RVR.  He attempted to work with physical therapy but became very weak and tachycardic to the 160s after walking only 40 feet and had to be placed in a chair and wheeled back to his room.    Assessment & Plan:  Abdominal Pain, Bloody Diarrhea, due to rectal ulcer:  CT with presacral stranding and indistinct appearance of rectum.  Pain continues to improve.    Had some streaks of blood in the toilet paper this morning after he formed brown bowel movement, but minimal amount and hemoglobin stable -  D/c Ceftriaxone and flagyl -  GI path panel negative -  Continue PPI daily  Afib with RVR, HR persistently in 110s-120s and increases to 160s with minimal exertion -  Do not want to start dilt or CCB due to sHF -  Patient previously did not tolerate amiodarone and is allergic to digoxin -  D/c metoprolol and change back to carvedilol 25mg  BID (holding due to hypotension this morning) -  no coumadin after subdural hematoma -  EP consult, Dr. Lovena Le to see later today to assist   Chronic Systolic HF EF 93-23% Carvedilol to 25mg  BID (held due to hypotension this morning) Continue lasix  Continue lisinopril  (held due to hypotension this morning)  CAD, chest pain free -   Resume BB and ACEI when BP improved -  continue statin -  Resume aspirin  Hx of VT, s/p AICD placement  UTI: UA with + WBC, RBC's, neg nitrite.  Cx insignificant growth.  -  D/c ceftriaxone.  Thrombocytopenia:  - continue to monitor  CKD stage III: creatinine below baseline  Elevated Bilirubin: no gallbladder, no ductal dilatiation.  Ctm.  Ectatic Abdominal Aorta: f/u US recommended in 5 years  Generalized weakness -  PT evaluation:  Anticipate home with home health  DVT prophylaxis: SCD Code Status: full  Family Communication: none at bedside Disposition Plan:  Awaiting EP consult and possibly better HR control, then home tomorrow with [facility]    Consultants:   GI  Cardiology  Procedures:   Flex sig    Antimicrobials:  Anti-infectives (From admission, onward)   Start     Dose/Rate Route Frequency Ordered Stop   02/07/17 0830  metroNIDAZOLE (FLAGYL) tablet 500 mg     500 mg Oral Every 8 hours 02/07/17 0828     02/06/17 2315  cefTRIAXone (ROCEPHIN) 2 g in dextrose 5 % 50 mL IVPB     2 g 100 mL/hr over 30 Minutes Intravenous Daily at bedtime 02/06/17 2314         Subjective:  Feeling better today would like to go home.  Although the report yesterday was that he became fatigued and somewhat dyspneic with PT yesterday, the patient states that he felt fine when he work with physical  therapy yesterday and he was not sure why they made him sit down to return to his room after only 40 feet.  He denies any chest pains, shortness of breath.  He had a formed bowel movement this morning with a streak of blood.  Objective: Vitals:   02/08/17 2205 02/09/17 0418 02/09/17 0917 02/09/17 0919  BP: 114/70 120/82 (!) 86/54 94/69  Pulse: (!) 102 98 (!) 116   Resp: 17 18 16    Temp: 97.7 F (36.5 C) 98.3 F (36.8 C)    TempSrc: Oral Oral    SpO2: 97% 97% 98%   Weight:  83.3 kg (183 lb 10.3 oz)    Height:        Intake/Output Summary (Last 24 hours) at 02/09/2017  1434 Last data filed at 02/09/2017 0200 Gross per 24 hour  Intake 170 ml  Output -  Net 170 ml   Filed Weights   02/06/17 2304 02/07/17 0612 02/09/17 0418  Weight: 87.1 kg (192 lb 0.3 oz) 87.1 kg (192 lb 0.3 oz) 83.3 kg (183 lb 10.3 oz)    Examination:  General exam:  Adult male.  No acute distress.  HEENT:  NCAT, MMM Respiratory system: Clear to auscultation bilaterally Cardiovascular system: IRRR and tachycardic, normal S1/S2. No murmurs, rubs, gallops or clicks.  Warm extremities Gastrointestinal system: Normal active bowel sounds, soft, nondistended, nontender. MSK:  Normal tone and bulk, no lower extremity edema Neuro:  Grossly intact   Data Reviewed: I have personally reviewed following labs and imaging studies  CBC: Recent Labs  Lab 02/06/17 1039 02/07/17 0519 02/08/17 0503 02/09/17 0538  WBC 8.2 5.8 5.4 6.2  NEUTROABS 3.9  --   --   --   HGB 13.9 12.9* 12.6* 13.4  HCT 41.8 39.5 37.8* 40.3  MCV 88.7 89.2 88.7 89.2  PLT 143* 123* 113* 073*   Basic Metabolic Panel: Recent Labs  Lab 02/06/17 1039 02/07/17 0519 02/08/17 0503 02/09/17 0538  NA 137 138 138 138  K 4.4 4.0 3.3* 3.3*  CL 102 107 105 104  CO2 24 23 24 24   GLUCOSE 107* 90 99 112*  BUN 25* 22* 17 14  CREATININE 1.38* 1.18 1.18 1.27*  CALCIUM 9.6 8.8* 8.6* 8.8*  MG  --   --  1.7  --    GFR: Estimated Creatinine Clearance: 49.2 mL/min (A) (by C-G formula based on SCr of 1.27 mg/dL (H)). Liver Function Tests: Recent Labs  Lab 02/06/17 1039  AST 36  ALT 14*  ALKPHOS 73  BILITOT 2.1*  PROT 7.3  ALBUMIN 4.3   Recent Labs  Lab 02/06/17 1039  LIPASE 27   No results for input(s): AMMONIA in the last 168 hours. Coagulation Profile: No results for input(s): INR, PROTIME in the last 168 hours. Cardiac Enzymes: No results for input(s): CKTOTAL, CKMB, CKMBINDEX, TROPONINI in the last 168 hours. BNP (last 3 results) No results for input(s): PROBNP in the last 8760 hours. HbA1C: No results  for input(s): HGBA1C in the last 72 hours. CBG: No results for input(s): GLUCAP in the last 168 hours. Lipid Profile: No results for input(s): CHOL, HDL, LDLCALC, TRIG, CHOLHDL, LDLDIRECT in the last 72 hours. Thyroid Function Tests: Recent Labs    02/06/17 2315  TSH 0.553   Anemia Panel: No results for input(s): VITAMINB12, FOLATE, FERRITIN, TIBC, IRON, RETICCTPCT in the last 72 hours. Urine analysis:    Component Value Date/Time   COLORURINE YELLOW 02/06/2017 1128   APPEARANCEUR HAZY (A) 02/06/2017 1128  LABSPEC 1.018 02/06/2017 1128   PHURINE 5.0 02/06/2017 1128   GLUCOSEU NEGATIVE 02/06/2017 1128   HGBUR SMALL (A) 02/06/2017 1128   BILIRUBINUR NEGATIVE 02/06/2017 1128   KETONESUR 20 (A) 02/06/2017 1128   PROTEINUR 30 (A) 02/06/2017 1128   UROBILINOGEN 0.2 09/09/2012 1501   NITRITE NEGATIVE 02/06/2017 1128   LEUKOCYTESUR MODERATE (A) 02/06/2017 1128   Sepsis Labs: @LABRCNTIP (procalcitonin:4,lacticidven:4)  ) Recent Results (from the past 240 hour(s))  Urine culture     Status: Abnormal   Collection Time: 02/07/17 12:39 AM  Result Value Ref Range Status   Specimen Description URINE, RANDOM  Final   Special Requests NONE  Final   Culture (A)  Final    <10,000 COLONIES/mL INSIGNIFICANT GROWTH Performed at [facility], [place] [address], [place], [place] [address]    Report Status 02/08/2017 FINAL  Final  Gastrointestinal Panel by PCR , Stool     Status: None   Collection Time: 02/07/17 12:39 AM  Result Value Ref Range Status   Campylobacter species NOT DETECTED NOT DETECTED Final   Plesimonas shigelloides NOT DETECTED NOT DETECTED Final   Salmonella species NOT DETECTED NOT DETECTED Final   Yersinia enterocolitica NOT DETECTED NOT DETECTED Final   Vibrio species NOT DETECTED NOT DETECTED Final   Vibrio cholerae NOT DETECTED NOT DETECTED Final   Enteroaggregative E coli (EAEC) NOT DETECTED NOT DETECTED Final   Enteropathogenic E coli (EPEC) NOT DETECTED NOT  DETECTED Final   Enterotoxigenic E coli (ETEC) NOT DETECTED NOT DETECTED Final   Shiga like toxin producing E coli (STEC) NOT DETECTED NOT DETECTED Final   Shigella/Enteroinvasive E coli (EIEC) NOT DETECTED NOT DETECTED Final   Cryptosporidium NOT DETECTED NOT DETECTED Final   Cyclospora cayetanensis NOT DETECTED NOT DETECTED Final   Entamoeba histolytica NOT DETECTED NOT DETECTED Final   Giardia lamblia NOT DETECTED NOT DETECTED Final   Adenovirus F40/41 NOT DETECTED NOT DETECTED Final   Astrovirus NOT DETECTED NOT DETECTED Final   Norovirus GI/GII NOT DETECTED NOT DETECTED Final   Rotavirus A NOT DETECTED NOT DETECTED Final   Sapovirus (I, II, IV, and V) NOT DETECTED NOT DETECTED Final    Comment: Performed at [facility], [address] [place], [place] [address]  MRSA PCR Screening     Status: None   Collection Time: 02/07/17  2:03 AM  Result Value Ref Range Status   MRSA by PCR NEGATIVE NEGATIVE Final    Comment:        The GeneXpert MRSA Assay (FDA approved for NASAL specimens only), is one component of a comprehensive MRSA colonization surveillance program. It is not intended to diagnose MRSA infection nor to guide or monitor treatment for MRSA infections.       Radiology Studies: No results found.   Scheduled Meds: . aspirin EC  81 mg Oral Daily  . carvedilol  25 mg Oral BID WC  . cycloSPORINE  1 drop Both Eyes BID  . famotidine  20 mg Oral Daily  . fluticasone  2 spray Each Nare Daily  . furosemide  40 mg Oral Daily  . gabapentin  200 mg Oral QHS  . lisinopril  10 mg Oral Daily  . metroNIDAZOLE  500 mg Oral Q8H  . mometasone-formoterol  2 puff Inhalation BID   Continuous Infusions: . cefTRIAXone (ROCEPHIN)  IV Stopped (02/08/17 2236)     LOS: 3 days    Time spent: 30 min    Janece Canterbury, MD [facility] Pager [phone]  If 7PM-7AM, please contact night-coverage [email or web address] Password [email or web address] 02/09/2017, 2:34 PM

## 2017-02-09 NOTE — Progress Notes (Signed)
Taking over care of patient. Agree with previous RN assessment. Denies any needs at this time. Will continue to monitor.  

## 2017-02-09 NOTE — Care Management Important Message (Signed)
Important Message  Patient Details  Name: [name] MRN: [id number] Date of Birth: [birth year]   Medicare Important Message Given:  Yes    Kerin Salen 02/09/2017, 11:04 AM

## 2017-02-09 NOTE — Plan of Care (Signed)
  Nutrition: Adequate nutrition will be maintained 02/09/2017 0838 - Progressing by Dorene Sorrow, RN   Elimination: Will not experience complications related to bowel motility 02/09/2017 0838 - Progressing by Dorene Sorrow, RN   Pain Managment: General experience of comfort will improve 02/09/2017 0838 - Progressing by Dorene Sorrow, RN

## 2017-02-09 NOTE — Consult Note (Addendum)
   [facility] CM Inpatient Consult   02/09/2017  IFEANYICHUKWU WICKHAM [birth year] [id number]    Patient screened for potential [facility] Care Management services due to increased hospital/ED risk readmission score of 91%. He has a history of CHF, AFIB, CAD, DM, HLD, AICD, GERD.  Spoke with Mr. Hoey at bedside to discuss [place] Management services. Mr. Bushey is agreeable and [facility] Care Management written consent obtained. He states " I need all the help I can get".   Mr. Vantrease endorses he lives alone. States he usually drives to the MD office himself. States he would use a cab if he needed to. His son lives in [place]. States he is not very close with his neighbors and denies having much of a support system.  Confirms Primary Care MD is Dr. Sarajane Jews. Confirmed best contact number is [phone] Still drives but may need transportation assistance. Does not weigh daily but has a scale. Denies concerns with taking his medications.  Mr. Nuzum expressed appreciation of [name] Management follow up. States "sometimes people just do not want to be bothered with old people anymore". Writer expressed encouragement and support as Mr. Schoenherr became a little teary.   Discussed [facility] Care Management referral for [facility] RNCM. Will ask [facility] RNCM to assess the need for [facility] LCSW for transportation needs.   Provided Mr. Shrout with [place] Management folder along with contact information. Also provided a reminder card with [place] Management office number to call if transportation is needed to PCP office Velora Heckler at [place].  Mr. Matherne expressed appreciation of the visit.  Will alert PCP office that [place] Management to follow post dc.   Will make inpatient RNCM aware [place] Management to follow.    Marthenia Rolling, MSN-Ed, RN,BSN [facility] Liaison [phone]

## 2017-02-09 NOTE — Consult Note (Signed)
Reason for Consult:atrial fib with a RVR  Referring Physician: Dr. Bonnita Hollow is an 84 y.o. male.   HPI: The patient is a 84 yo man well known to me who was admitted with diarrhea and abdominal pain and noted to have atrial fib with a RVR, particularly with exertion which resulted in sob and weakness. He has undergone extensive workup. He carries an allergy which is not well characterized and review of old records describes that he had abdominal pain and nausea but was also on Multaq. Both drugs were stopped. He now has chronic atrial fib and cannot be anti-coagulated due to old SDH. He does not feel bad at rest and wants to go home.   PMH: Past Medical History:  Diagnosis Date  . Abnormal thyroid scan    Abnormal thyroid imaging studies from 11/09/2010, status post ultrasound guided fine needle aspiration of the dominant left inferior thyroid nodule on 12/15/2010. Cytology report showed rare follicular epithelial cells and hemosiderin laden macrophages.  Marland Kitchen AICD (automatic cardioverter/defibrillator) present    fx lead   . Arthritis    "all over"  . Asthma   . Atrial fibrillation ([place])    on chronic Coumadin; stopped July 2013 due to subdural hematomas  . CHF (congestive heart failure) (HCC)    EF 35-40% s/p most recent ICD generator change-out with Medtronic dual-chamber ICD 05/20/11 with explantation of previous abdominally-implanted device  . Coronary artery disease    s/p CABG 1983 and PCI/stent 2004.   . Diabetes mellitus    diet controlled  . Dyslipidemia   . Erythrocytosis   . GERD (gastroesophageal reflux disease)   . Hypertension   . Ischemic cardiomyopathy    WITH CHF  . Monocytosis 04/17/2013  . Myocardial infarction ([place]) 1983; ~ 1990  . 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 Pneumonia August 2013  . Subdural hematoma [facility]) July 2013   Anticoagulation stopped.   . VT (ventricular tachycardia) ([place])   . Wears glasses     PSHX: Past Surgical History:  Procedure  Laterality Date  . CHOLECYSTECTOMY    . COLONOSCOPY  07/07/2011   Procedure: COLONOSCOPY;  Surgeon: Jerene Bears, MD;  Location: [facility] ENDOSCOPY;  Service: Gastroenterology;  Laterality: N/A;  . CORONARY ANGIOPLASTY WITH STENT PLACEMENT  2004   Tandem Cypher stents LAD  . CORONARY ARTERY BYPASS GRAFT  1983   SVG-mLAD  . ESOPHAGOGASTRODUODENOSCOPY  02/11/2011   Procedure: ESOPHAGOGASTRODUODENOSCOPY (EGD);  Surgeon: Beryle Beams, MD;  Location: [facility];  Service: Endoscopy;  Laterality: N/A;  . FLEXIBLE SIGMOIDOSCOPY N/A 02/08/2017   Procedure: FLEXIBLE SIGMOIDOSCOPY;  Surgeon: Irene Shipper, MD;  Location: [facility];  Service: Endoscopy;  Laterality: N/A;  . ICD GENERATOR CHANGEOUT N/A 04/08/2016   Procedure: ICD Generator Changeout;  Surgeon: Evans Lance, MD;  Location: [facility] CV LAB;  Service: Cardiovascular;  Laterality: N/A;  . IMPLANTABLE CARDIOVERTER DEFIBRILLATOR (ICD) GENERATOR CHANGE N/A 05/20/2011   Procedure: ICD GENERATOR CHANGE;  Surgeon: Evans Lance, MD;  Medtronic secure dual-chamber ICD serial number [id number]   . KNEE ARTHROSCOPY     right; "just went in and scraped it"  . MASS EXCISION Right 05/10/2013   Procedure: EXCISION MASS RIGHT THUMB;  Surgeon: Wynonia Sours, MD;  Location: [place];  Service: Orthopedics;  Laterality: Right;  . PROXIMAL INTERPHALANGEAL FUSION (PIP) Right 05/10/2013   Procedure: DEBRIDEMENT PROXIMAL INTERPHALANGEAL FUSION (PIP);  Surgeon: Wynonia Sours, MD;  Location: [facility];  Service: Orthopedics;  Laterality: Right;  . TONSILLECTOMY          FAMHX: Family History  Problem Relation Age of Onset  . Tuberculosis Mother   . Tuberculosis Father   . Heart disease Brother   . Diabetes Sister   . Diabetes Brother   . Clotting disorder Brother     Social History:  reports that he quit smoking about 40 years ago. His smoking use included cigarettes. He has a 60.00 pack-year smoking history. he has never  used smokeless tobacco. He reports that he does not drink alcohol or use drugs.  Allergies:  Allergies  Allergen Reactions  . Celebrex [Celecoxib] Hives and Other (See Comments)    Gi upset  . Esomeprazole Magnesium Hives and Other (See Comments)    Other reaction(s): Hives "don't really remember"  . Tamsulosin Other (See Comments)    Dizziness, Made BP very low and weakness  . Dronedarone Other (See Comments)    Other reaction(s): GI upset, abdominal pain  . Digoxin Other (See Comments)    Unknown   . Hydrocodone-Acetaminophen Other (See Comments)    Bad headache  . Protonix [Pantoprazole Sodium] Nausea And Vomiting    Tolerates Dexilant    Medications: I have reviewed the patient's current medications.  No results found.  ROS  As stated in the HPI and negative for all other systems.  Physical Exam  Vitals:Blood pressure 94/69, pulse (!) 116, temperature 98.3 F (36.8 C), temperature source Oral, resp. rate 16, height 6' (1.829 m), weight 183 lb 10.3 oz (83.3 kg), SpO2 98 %.  Well appearing NAD HEENT: Unremarkable Neck:  No JVD, no thyromegally Lymphatics:  No adenopathy Back:  No CVA tenderness Lungs:  Clear HEART:  Regular rate rhythm, no murmurs, no rubs, no clicks Abd:  Flat, positive bowel sounds, no organomegally, no rebound, no guarding Ext:  2 plus pulses, no edema, no cyanosis, no clubbing Skin:  No rashes no nodules Neuro:  CN II through XII intact, motor grossly intact  Tele - atrial fib with a RVR Labs - reviewed Assessment/Plan: 1. Atrial fib with a RVR - at this point, I would suggest adding a single dose of IV digoxin and if tolerated begin oral digoxin 0.125 mg daily with a digoxin level in 1-2 weeks. Would also use toprol to help control his ventricular rates. 2. Chronic systolic heart failure - he appears to be well compensated. Continue his current meds.  Samyra Limb,M.D.  Carleene Overlie TaylorMD 02/09/2017, 3:38 PM

## 2017-02-09 NOTE — Care Management Note (Signed)
Case Management Note  Patient Details  Name: [name] MRN: [id number] Date of Birth: [birth year]  Subjective/Objective:   PT recc HHPT;patient chose [facility] rep [facility] aware of HHPT order, await d/c.                 Action/Plan:d/c home w/HHPT.   Expected Discharge Date:  (unknown)               Expected Discharge Plan:  [place]  In-House Referral:     Discharge planning Services  CM Consult  Post Acute Care Choice:  Durable Medical Equipment(cane,rw) Choice offered to:  Patient  DME Arranged:    DME Agency:     HH Arranged:  PT [place]:  [place]  Status of Service:  In process, will continue to follow  If discussed at Long Length of Stay Meetings, dates discussed:    Additional Comments:  Dessa Phi, RN 02/09/2017, 12:01 PM

## 2017-02-09 NOTE — Progress Notes (Signed)
Physical Therapy Treatment Patient Details Name: [name] MRN: [id number] DOB: [birth year] Today's Date: 02/09/2017    History of Present Illness Pt admitted by ambulance 2* abdominal pain.  Pt with hx of CHF, a-fib, CABG and ischemic cardiomyopathy    PT Comments    Assisted OOB to amb to bathroom then in hallway with walker vs cane for increases safety.  Tolerated well.   Follow Up Recommendations  Home health PT;SNF     Equipment Recommendations  None recommended by PT    Recommendations for Other Services       Precautions / Restrictions Precautions Precautions: Fall Restrictions Weight Bearing Restrictions: No    Mobility  Bed Mobility Overal bed mobility: Modified Independent             General bed mobility comments: With increased time   Transfers Overall transfer level: Needs assistance Equipment used: None Transfers: Sit to/from [name] Transfers Sit to Stand: Supervision;Min guard Stand pivot transfers: Supervision;Min guard       General transfer comment: steady assist only   also assisted in bathroom   Ambulation/Gait Ambulation/Gait assistance: Supervision;Min guard Ambulation Distance (Feet): 52 Feet Assistive device: Rolling walker (2 wheeled) Gait Pattern/deviations: Step-through pattern;Decreased step length - right;Decreased step length - left;Shuffle;Trunk flexed     General Gait Details: amb with RW this session to increase safety and balance.  Tolerated well in hallway but in room pt used furnitture.    Stairs            Wheelchair Mobility    Modified Rankin (Stroke Patients Only)       Balance                                            Cognition Arousal/Alertness: Awake/alert Behavior During Therapy: WFL for tasks assessed/performed Overall Cognitive Status: Within Functional Limits for tasks assessed                                 General Comments: pleasant      Exercises      General Comments        Pertinent Vitals/Pain Pain Assessment: No/denies pain    Home Living                      Prior Function            PT Goals (current goals can now be found in the care plan section) Progress towards PT goals: Progressing toward goals    Frequency    Min 3X/week      PT Plan Current plan remains appropriate    Co-evaluation              AM-PAC PT "6 Clicks" Daily Activity  Outcome Measure  Difficulty turning over in bed (including adjusting bedclothes, sheets and blankets)?: A Little Difficulty moving from lying on back to sitting on the side of the bed? : A Little   Help needed moving to and from a bed to chair (including a wheelchair)?: A Little Help needed walking in hospital room?: A Little Help needed climbing 3-5 steps with a railing? : A Lot 6 Click Score: 14    End of Session Equipment Utilized During Treatment: Gait belt Activity Tolerance: Patient tolerated treatment well Patient left: in chair;with  call bell/phone within reach Nurse Communication: Mobility status PT Visit Diagnosis: Unsteadiness on feet (R26.81);Muscle weakness (generalized) (M62.81);Difficulty in walking, not elsewhere classified (R26.2)     Time: 9774-1423 PT Time Calculation (min) (ACUTE ONLY): 26 min  Charges:  $Gait Training: 8-22 mins $Therapeutic Activity: 8-22 mins                    G Codes:       Rica Koyanagi  PTA [facility]  Acute  Rehab Pager      [phone]

## 2017-02-10 DIAGNOSIS — I5022 Chronic systolic (congestive) heart failure: Secondary | ICD-10-CM

## 2017-02-10 DIAGNOSIS — I4891 Unspecified atrial fibrillation: Secondary | ICD-10-CM

## 2017-02-10 LAB — CBC
HEMATOCRIT: 42.5 % (ref 39.0–52.0)
HEMOGLOBIN: 13.9 g/dL (ref 13.0–17.0)
MCH: 29.4 pg (ref 26.0–34.0)
MCHC: 32.7 g/dL (ref 30.0–36.0)
MCV: 90 fL (ref 78.0–100.0)
Platelets: 135 10*3/uL — ABNORMAL LOW (ref 150–400)
RBC: 4.72 MIL/uL (ref 4.22–5.81)
RDW: 15.4 % (ref 11.5–15.5)
WBC: 7.7 10*3/uL (ref 4.0–10.5)

## 2017-02-10 LAB — BASIC METABOLIC PANEL
ANION GAP: 8 (ref 5–15)
BUN: 14 mg/dL (ref 6–20)
CHLORIDE: 104 mmol/L (ref 101–111)
CO2: 28 mmol/L (ref 22–32)
Calcium: 9.2 mg/dL (ref 8.9–10.3)
Creatinine, Ser: 1.15 mg/dL (ref 0.61–1.24)
GFR calc Af Amer: >60 mL/min (ref >60)
GFR, EST NON AFRICAN AMERICAN: 57 mL/min — AB (ref >60)
Glucose, Bld: 106 mg/dL — ABNORMAL HIGH (ref 65–99)
POTASSIUM: 3.5 mmol/L (ref 3.5–5.1)
Sodium: 140 mmol/L (ref 135–145)

## 2017-02-10 MED ORDER — DIGOXIN 125 MCG PO TABS
0.1250 mg | ORAL_TABLET | Freq: Every day | ORAL | Status: DC
  Administered 2017-02-10: 0.125 mg via ORAL
  Filled 2017-02-10: qty 1

## 2017-02-10 MED ORDER — DIGOXIN 125 MCG PO TABS: 0.1250 mg | ORAL_TABLET | Freq: Every day | ORAL | 0 refills | Status: DC

## 2017-02-10 MED ORDER — POLYETHYLENE GLYCOL 3350 17 G PO PACK: 17.0000 g | PACK | Freq: Every day | ORAL | 0 refills | Status: DC | PRN

## 2017-02-10 MED ORDER — METOPROLOL SUCCINATE ER 25 MG PO TB24: 25.0000 mg | ORAL_TABLET | Freq: Every day | ORAL | 0 refills | Status: DC

## 2017-02-10 MED ORDER — OXYCODONE-ACETAMINOPHEN 5-325 MG PO TABS
1.0000 | ORAL_TABLET | Freq: Once | ORAL | Status: AC
  Administered 2017-02-10: 1 via ORAL
  Filled 2017-02-10: qty 1

## 2017-02-10 MED ORDER — CYCLOBENZAPRINE HCL 5 MG PO TABS
7.5000 mg | ORAL_TABLET | Freq: Once | ORAL | Status: AC
  Administered 2017-02-10: 7.5 mg via ORAL
  Filled 2017-02-10: qty 2

## 2017-02-10 NOTE — Care Management Note (Signed)
Case Management Note  Patient Details  Name: [name] MRN: [id number] Date of Birth: [birth year]  Subjective/Objective:  [facility] aware of d/c today-HHPT. Patient has all dme already prior to admission. No further CM needs.                  Action/Plan:d/c home w/HHC.   Expected Discharge Date:  02/10/17               Expected Discharge Plan:  [place]  In-House Referral:     Discharge planning Services  CM Consult  Post Acute Care Choice:  Durable Medical Equipment(cane,rw) Choice offered to:  Patient  DME Arranged:    DME Agency:     HH Arranged:  PT [place]:  [place]  Status of Service:  Completed, signed off  If discussed at [place] of Stay Meetings, dates discussed:    Additional Comments:  Dessa Phi, RN 02/10/2017, 9:18 AM

## 2017-02-10 NOTE — Progress Notes (Signed)
Physical Therapy Treatment Patient Details Name: [name] MRN: [id number] DOB: 1932/08/15 Today's Date: 02/10/2017    History of Present Illness Pt admitted by ambulance 2* abdominal pain.  Pt with hx of CHF, a-fib, CABG and ischemic cardiomyopathy    PT Comments    Assisted OOB to amb to bathroom then an increased distance in hallway using his personal cane.    Follow Up Recommendations  Home health PT;SNF(pt plans to return home)     Equipment Recommendations  None recommended by PT    Recommendations for Other Services       Precautions / Restrictions Precautions Precautions: Fall Restrictions Weight Bearing Restrictions: No    Mobility  Bed Mobility Overal bed mobility: Modified Independent             General bed mobility comments: With increased time   Transfers Overall transfer level: Modified independent Equipment used: None Transfers: Sit to/from [employer] Sit to Stand: Modified independent (Device/Increase time) Stand pivot transfers: Modified independent (Device/Increase time)       General transfer comment: good safety cognition and use of hands to steady self  Ambulation/Gait Ambulation/Gait assistance: Modified independent (Device/Increase time);Supervision Ambulation Distance (Feet): 125 Feet Assistive device: Rolling walker (2 wheeled) Gait Pattern/deviations: Step-through pattern;Decreased step length - right;Decreased step length - left;Shuffle;Trunk flexed Gait velocity: decr   General Gait Details: amb with his cane well.  Mild R knee pain   Stairs            Wheelchair Mobility    Modified Rankin (Stroke Patients Only)       Balance                                            Cognition Arousal/Alertness: Awake/alert Behavior During Therapy: WFL for tasks assessed/performed Overall Cognitive Status: Within Functional Limits for tasks assessed                                        Exercises      General Comments        Pertinent Vitals/Pain Pain Assessment: Faces Faces Pain Scale: Hurts a little bit Pain Location: R knee Pain Descriptors / Indicators: Discomfort Pain Intervention(s): Monitored during session    Home Living                      Prior Function            PT Goals (current goals can now be found in the care plan section) Progress towards PT goals: Progressing toward goals    Frequency    Min 3X/week      PT Plan Current plan remains appropriate    Co-evaluation              AM-PAC PT "6 Clicks" Daily Activity  Outcome Measure  Difficulty turning over in bed (including adjusting bedclothes, sheets and blankets)?: A Little Difficulty moving from lying on back to sitting on the side of the bed? : A Little Difficulty sitting down on and standing up from a chair with arms (e.g., wheelchair, bedside commode, etc,.)?: A Little Help needed moving to and from a bed to chair (including a wheelchair)?: A Little Help needed walking in hospital room?: A Little Help needed climbing 3-5  steps with a railing? : A Lot 6 Click Score: 17    End of Session Equipment Utilized During Treatment: Gait belt Activity Tolerance: Patient tolerated treatment well Patient left: in chair;with call bell/phone within reach Nurse Communication: Mobility status PT Visit Diagnosis: Unsteadiness on feet (R26.81);Muscle weakness (generalized) (M62.81);Difficulty in walking, not elsewhere classified (R26.2)     Time: 7893-8101 PT Time Calculation (min) (ACUTE ONLY): 14 min  Charges:  $Gait Training: 8-22 mins                    G Codes:       Rica Koyanagi  PTA [facility]  Acute  Rehab Pager      [phone]

## 2017-02-10 NOTE — Discharge Summary (Signed)
Physician Discharge Summary  [name] KWI:[id number] DOB: 11-Jun-1932 DOA: 02/06/2017  PCP: [name], MD  Admit date: 02/06/2017 Discharge date: 02/10/2017  Admitted From: home  Disposition:  home  Recommendations for Outpatient Follow-up:  1. Follow up with Dr. Lovena Le in 1 weeks for digoxin level and heart rate evaluation.  If blood pressure tolerates, consider resuming ACEI.   2. PCP in 1 month or sooner for follow up of rectal bleeding 3. PCP:  Repeat Abd [place] to eval for AAA in 5 years  Home Health:  PT  Equipment/Devices:  Cane, rolling walker  Discharge Condition:  Stable, improved CODE STATUS:  Full code  Diet recommendation:  Healthy heart   Brief/Interim Summary:  [name] a 84 y.o.malewith medical history significant ofa-fib(was on Coumadin but this was stopped in 2013 due to subdural hematoma), pt follows with Dr. Crissie Sickles, AICD,chronic systolicCHF EF 24-26%, CAD, DM, HLD, presented with sudden onset severe epigastric and lower quadrants abd pain associated with mixed episodes of bloody and non bloody diarrhea.  CT demonstrated presacral stranding and indistinct appearance of rectum.  GI was consulted.  Patient underwent flexible sigmoidoscopy on 1/9 which revealed a 6 mm ulcer that did not have the stigmata of bleeding.  Despite his bloody stools and abdominal pain improving, he became weak and started having A. fib with RVR.  He attempted to work with physical therapy but became tachycardic to the 160s after walking only 40 feet and had to be placed in a chair and wheeled back to his room.    Patient denied that he felt unwell while ambulating with physical therapy.  His beta-blocker was increased and ultimately changed to metoprolol.  He was also started on digoxin at the recommendation of his cardiologist.  He will follow-up with Dr. Lovena Le in clinic in 1 week for a digoxin level and reevaluation of his A. Fib.  Although digoxin was listed as an  allergy on his medication list, the patient had no recollection of what his allergy was.  After reviewing the notes, I found a hospitalization about 9 years ago during which he was started on numerous new medications and he developed abdominal pains and diarrhea.  The medications that he had been most recently started on including digoxin were listed as allergies.  I think it is reasonable to retry this medication with close monitoring for side effects.    Discharge Diagnoses:  Active Problems:   Abdominal pain   Proctitis   Rectal ulcer   Rectal bleeding  Abdominal Pain with bloody diarrhea due to rectal ulcer -  Minimal residual bleeding with BMs (streaks on toilet paper) -  Hemoglobin remained stable -  GI path panel negative -  Continue PPI daily -  Repeat CBC in a few weeks if bleeding continues.  Afib with RVR, HR increased to 110s-120s and increased to 160s with minimal exertion -  Do not want to start dilt or CCB due to sHF -  Patient previously did not tolerate amiodarone -  EP changed carvedilol to metoprolol XL and added digoxin -  F/u with EP in 1 week for blood work and reevaluation -  no coumadin after subdural hematoma  Chronic Systolic HF EF 83-41% Carvedilol changed to metoprolol Continued lasix  Stopped lisinopril due to hypotension and can be resumed as an outpatient at discretion of cardiologist  CAD, chest pain free -  Resume BB and ACEI when BP improved -  continue statin -  Resume aspirin  Hx of VT, s/p AICD placement  UTI: UA with + WBC, RBC's, neg nitrite. Cx insignificant growth.  -  D/c ceftriaxone.  Thrombocytopenia:  - continue to monitor  CKD stage HCW:CBJSEGBTDV below baseline  Elevated Bilirubin:no signs of cholecystitis and no ductal dilatation on [place].    Ectatic Abdominal Aorta: f/u US recommended in 5 years  Generalized weakness -  PT evaluation:  Anticipate home with home health   Discharge Instructions  Discharge  Instructions    (HEART FAILURE PATIENTS) Call MD:  Anytime you have any of the following symptoms: 1) 3 pound weight gain in 24 hours or 5 pounds in 1 week 2) shortness of breath, with or without a dry hacking cough 3) swelling in the hands, feet or stomach 4) if you have to sleep on extra pillows at night in order to breathe.   Complete by:  As directed    AMB Referral to [facility]   Complete by:  As directed    Please assign to [facility] for transition of care. Has increased ED/hospital readmission score. Lives alone. History of CHF, DM, HLD, HTN, AICD, AFIB. Written consent obtained. Hospital liaison to contact PCP office contact to make aware [facility] to follow post discharge. Please call with questions. Marthenia Rolling, [place], RN,BSN-[facility]N [facility] [phone]   Reason for consult:  Please assign to [facility] RNCM   Diagnoses of:   Diabetes Heart Failure     Expected date of contact:  1-3 days (reserved for hospital discharges)   Call MD for:  difficulty breathing, headache or visual disturbances   Complete by:  As directed    Call MD for:  extreme fatigue   Complete by:  As directed    Call MD for:  hives   Complete by:  As directed    Call MD for:  persistant dizziness or light-headedness   Complete by:  As directed    Call MD for:  persistant nausea and vomiting   Complete by:  As directed    Call MD for:  severe uncontrolled pain   Complete by:  As directed    Call MD for:  temperature >100.4   Complete by:  As directed    Diet - low sodium heart healthy   Complete by:  As directed    Increase activity slowly   Complete by:  As directed      Allergies as of 02/10/2017      Reactions   Celebrex [celecoxib] Hives, Other (See Comments)   Gi upset   Esomeprazole Magnesium Hives, Other (See Comments)   Other reaction(s): Hives "don't really remember"   Tamsulosin Other (See Comments)   Dizziness, Made BP very low and weakness    Dronedarone Other (See Comments)   Other reaction(s): GI upset, abdominal pain   Digoxin Other (See Comments)   May have caused some diarrhea   Hydrocodone-acetaminophen Other (See Comments)   Bad headache   Protonix [pantoprazole Sodium] Nausea And Vomiting   Tolerates Dexilant      Medication List    STOP taking these medications   carvedilol 12.5 MG tablet Commonly known as:  COREG   lisinopril 10 MG tablet Commonly known as:  PRINIVIL,ZESTRIL     TAKE these medications   ADVAIR DISKUS 250-50 MCG/DOSE Aepb Generic drug:  Fluticasone-Salmeterol Inhale 1 puff into the lungs 2 (two) times daily.   aspirin 81 MG tablet Take 81-325 mg daily as needed by mouth for headache.  calcium carbonate 500 MG chewable tablet Commonly known as:  TUMS - dosed in mg elemental calcium Chew 3 tablets by mouth daily as needed for heartburn.   digoxin 0.125 MG tablet Commonly known as:  LANOXIN Take 1 tablet (0.125 mg total) by mouth daily.   fluticasone 50 MCG/ACT nasal spray Commonly known as:  FLONASE Place 2 sprays into both nostrils daily.   furosemide 40 MG tablet Commonly known as:  LASIX Take 1 tablet (40 mg total) by mouth daily. May take an extra tab daily as needed for swelling   gabapentin 100 MG capsule Commonly known as:  NEURONTIN TAKE 1 CAPSULE (100MG ) TO 3 CAPSULES (300MG ) EACH NIGHT AS NEEDED FOR MUSCLE PAINS What changed:    how much to take  how to take this  when to take this  additional instructions   LINZESS 72 MCG capsule Generic drug:  linaclotide Take 72 mcg by mouth daily.   loratadine 10 MG tablet Commonly known as:  CLARITIN Take 10 mg by mouth daily.   metoprolol succinate 25 MG 24 hr tablet Commonly known as:  TOPROL-XL Take 1 tablet (25 mg total) by mouth daily.   nitroGLYCERIN 0.4 MG SL tablet Commonly known as:  NITROSTAT PLACE ONE UNDER TONGUE FOR CHEST PAIN.   polyethylene glycol packet Commonly known as:  MIRALAX /  GLYCOLAX Take 17 g by mouth daily as needed for mild constipation.   potassium chloride 10 MEQ tablet Commonly known as:  K-DUR,KLOR-CON Take 1 tablet (10 mEq total) by mouth daily.   ranitidine 150 MG tablet Commonly known as:  ZANTAC Take 1 tablet (150 mg total) 2 (two) times daily by mouth.   RESTASIS 0.05 % ophthalmic emulsion Generic drug:  cycloSPORINE INSTILL 1 DROP INTO BOTH EYES TWICE A DAY   traMADol 50 MG tablet Commonly known as:  ULTRAM TAKE 2 TABLETS BY MOUTH EVERY 8 HOURS AS NEEDED What changed:  See the new instructions.      Follow-up Information    [facility].   Specialty:  [place] Why:  [facility] physical therapy Contact information: [place] [address] [phone]        [name], MD. Schedule an appointment as soon as possible for a visit.   Specialty:  Family Medicine Why:  as needed Contact information: [place] [address] [phone]        Evans Lance, MD. Schedule an appointment as soon as possible for a visit in 1 week(s).   Specialty:  Cardiology Why:  heart rate check and bloodwork (digoxin level) Contact information: [address] [place] [address] [phone]          Allergies  Allergen Reactions  . Celebrex [Celecoxib] Hives and Other (See Comments)    Gi upset  . Esomeprazole Magnesium Hives and Other (See Comments)    Other reaction(s): Hives "don't really remember"  . Tamsulosin Other (See Comments)    Dizziness, Made BP very low and weakness  . Dronedarone Other (See Comments)    Other reaction(s): GI upset, abdominal pain  . Digoxin Other (See Comments)    May have caused some diarrhea  . Hydrocodone-Acetaminophen Other (See Comments)    Bad headache  . Protonix [Pantoprazole Sodium] Nausea And Vomiting    Tolerates Dexilant    Consultations: Cardiology (Electrophysiology) Dr.  Lovena Le  Procedures/Studies: Ct Abdomen Pelvis W Contrast  Result Date: 02/06/2017 CLINICAL DATA:  84 year old male with 2 days of abdominal pain and diarrhea. EXAM: CT ABDOMEN AND PELVIS WITH CONTRAST TECHNIQUE: Multidetector CT imaging of the abdomen and pelvis was performed using the standard protocol following bolus administration of intravenous contrast. CONTRAST:  130mL ISOVUE-300 IOPAMIDOL (ISOVUE-300) INJECTION 61% COMPARISON:  CT Abdomen and Pelvis 12/23/2016 and earlier. FINDINGS: Lower chest: Partially visible AICD leads. Stable cardiomegaly. No pericardial effusion. Stable lung base ventilation. Chronic lung base scarring and opacity, maximal in the medial basal segment of the right lower lobe where chronic round atelectasis is suspected. Unchanged trace right pleural fluid. Hepatobiliary: Surgically absent gallbladder. Stable liver including lobulated contour. No biliary ductal enlargement. Pancreas: Negative. Spleen: Stable lobulated spleen with benign calcification. Adrenals/Urinary Tract: Normal adrenal glands. Bilateral renal enhancement and contrast excretion is symmetric and within normal limits. Chronic bilateral nephrolithiasis is stable measuring 5-6 mm in each lower pole. No acute perinephric stranding. Negative course of both ureters. Mildly to moderately distended but otherwise unremarkable urinary bladder. Stomach/Bowel: Indistinct appearance of the rectum which appears to be fluid-filled but may be opacified due to mucosal thickening/inflammation (series 2, image 76). The sigmoid colon is decompressed. There is chronic proximal sigmoid diverticula without active inflammation. Decompressed descending colon. Negative transverse colon. Negative right colon and appendix (coronal image 44). Negative terminal ileum. Intermittent fluid and gas-filled small bowel loops, but no abnormally dilated small bowel. Decompressed stomach and duodenum. No abdominal free fluid or free air.  Vascular/Lymphatic: Aortoiliac calcified atherosclerosis. Major arterial structures remain patent. Stable ectasia of the infrarenal abdominal aorta up to 25 mm diameter. Similar stable bilateral iliac artery ectasia. Portal venous system appears patent. No lymphadenopathy. Reproductive: Negative. Other: No pelvic free fluid, but mild presacral stranding since November (series 2, image 74). Musculoskeletal: Prior sternotomy. Stable visualized osseous structures. Chronic epicardial pacer leads in the left ventral abdominal wall are stable. IMPRESSION: 1. Possible acute proctitis, with presacral stranding and indistinct appearance of the rectum. No associated free fluid or complicating features. 2. Elsewhere the bowel appears stable and noninflamed. 3. No other acute finding identified in the abdomen or pelvis. 4. Chronic findings including right lower lobe round atelectasis, nephrolithiasis, Aortic Atherosclerosis (ICD10-I70.0) and Ectatic abdominal aorta at risk for aneurysm development. Recommend followup by Aortic Ultrasound in 5 years. This recommendation follows ACR consensus guidelines: White Paper of the ACR Incidental Findings Committee II on Vascular Findings. J Am Coll Radiol 2013; 10:789-794. Electronically Signed   By: Genevie Ann M.D.   On: 02/06/2017 13:46    Subjective:  Feeling well.  Had a BM with a streak of blood on TP when wiping . Denies SOB, chest pains, palpitations.  Feels steady on his feet today and able to ambulate to the bathroom with minimal assistance.    Discharge Exam: Vitals:   02/09/17 2030 02/10/17 0358  BP: 108/67 137/85  Pulse: 99 86  Resp: 18 18  Temp: 97.7 F (36.5 C) 97.9 F (36.6 C)  SpO2: 98% 98%   Vitals:   02/09/17 1718 02/09/17 1926 02/09/17 2030 02/10/17 0358  BP: 118/77  108/67 137/85  Pulse: 90  99 86  Resp: 19  18 18   Temp: 98 F (36.7 C)  97.7 F (36.5 C) 97.9 F (36.6 C)  TempSrc: Oral  Oral Oral  SpO2: 97% 98% 98% 98%  Weight:    81.2 kg (179  lb 0.2 oz)  Height:        General: Pt is alert, awake, not in acute distress Cardiovascular: IRRR but no longer tachycardic,  S1/S2 +, no rubs, no gallops Respiratory: CTA bilaterally, no wheezing, no rhonchi Abdominal: Soft, NT, ND, bowel sounds + Extremities: no edema, no cyanosis    The results of significant diagnostics from this hospitalization (including imaging, microbiology, ancillary and laboratory) are listed below for reference.     Microbiology: Recent Results (from the past 240 hour(s))  Urine culture     Status: Abnormal   Collection Time: 02/07/17 12:39 AM  Result Value Ref Range Status   Specimen Description URINE, RANDOM  Final   Special Requests NONE  Final   Culture (A)  Final    <10,000 COLONIES/mL INSIGNIFICANT GROWTH Performed at [facility], [address] [place], [place] [address]    Report Status 02/08/2017 FINAL  Final  Gastrointestinal Panel by PCR , Stool     Status: None   Collection Time: 02/07/17 12:39 AM  Result Value Ref Range Status   Campylobacter species NOT DETECTED NOT DETECTED Final   Plesimonas shigelloides NOT DETECTED NOT DETECTED Final   Salmonella species NOT DETECTED NOT DETECTED Final   Yersinia enterocolitica NOT DETECTED NOT DETECTED Final   Vibrio species NOT DETECTED NOT DETECTED Final   Vibrio cholerae NOT DETECTED NOT DETECTED Final   Enteroaggregative E coli (EAEC) NOT DETECTED NOT DETECTED Final   Enteropathogenic E coli (EPEC) NOT DETECTED NOT DETECTED Final   Enterotoxigenic E coli (ETEC) NOT DETECTED NOT DETECTED Final   Shiga like toxin producing E coli (STEC) NOT DETECTED NOT DETECTED Final   Shigella/Enteroinvasive E coli (EIEC) NOT DETECTED NOT DETECTED Final   Cryptosporidium NOT DETECTED NOT DETECTED Final   Cyclospora cayetanensis NOT DETECTED NOT DETECTED Final   Entamoeba histolytica NOT DETECTED NOT DETECTED Final   Giardia lamblia NOT DETECTED NOT DETECTED Final   Adenovirus F40/41 NOT DETECTED  NOT DETECTED Final   Astrovirus NOT DETECTED NOT DETECTED Final   Norovirus GI/GII NOT DETECTED NOT DETECTED Final   Rotavirus A NOT DETECTED NOT DETECTED Final   Sapovirus (I, II, IV, and V) NOT DETECTED NOT DETECTED Final    Comment: Performed at [facility], [address] [place], [place] [address]  MRSA PCR Screening     Status: None   Collection Time: 02/07/17  2:03 AM  Result Value Ref Range Status   MRSA by PCR NEGATIVE NEGATIVE Final    Comment:        The GeneXpert MRSA Assay (FDA approved for NASAL specimens only), is one component of a comprehensive MRSA colonization surveillance program. It is not intended to diagnose MRSA infection nor to guide or monitor treatment for MRSA infections.      Labs: BNP (last 3 results) No results for input(s): BNP in the last 8760 hours. Basic Metabolic Panel: Recent Labs  Lab 02/06/17 1039 02/07/17 0519 02/08/17 0503 02/09/17 0538 02/10/17 0546  NA 137 138 138 138 140  K 4.4 4.0 3.3* 3.3* 3.5  CL 102 107 105 104 104  CO2 24 23 24 24 28   GLUCOSE 107* 90 99 112* 106*  BUN 25* 22* 17 14 14   CREATININE 1.38* 1.18 1.18 1.27* 1.15  CALCIUM 9.6 8.8* 8.6* 8.8* 9.2  MG  --   --  1.7  --   --    Liver Function Tests: Recent Labs  Lab 02/06/17 1039  AST 36  ALT 14*  ALKPHOS 73  BILITOT 2.1*  PROT 7.3  ALBUMIN 4.3   Recent Labs  Lab 02/06/17 1039  LIPASE 27   No  results for input(s): AMMONIA in the last 168 hours. CBC: Recent Labs  Lab 02/06/17 1039 02/07/17 0519 02/08/17 0503 02/09/17 0538 02/10/17 0546  WBC 8.2 5.8 5.4 6.2 7.7  NEUTROABS 3.9  --   --   --   --   HGB 13.9 12.9* 12.6* 13.4 13.9  HCT 41.8 39.5 37.8* 40.3 42.5  MCV 88.7 89.2 88.7 89.2 90.0  PLT 143* 123* 113* 132* 135*   Cardiac Enzymes: No results for input(s): CKTOTAL, CKMB, CKMBINDEX, TROPONINI in the last 168 hours. BNP: Invalid input(s): POCBNP CBG: No results for input(s): GLUCAP in the last 168 hours. D-Dimer No  results for input(s): DDIMER in the last 72 hours. Hgb A1c No results for input(s): HGBA1C in the last 72 hours. Lipid Profile No results for input(s): CHOL, HDL, LDLCALC, TRIG, CHOLHDL, LDLDIRECT in the last 72 hours. Thyroid function studies No results for input(s): TSH, T4TOTAL, T3FREE, THYROIDAB in the last 72 hours.  Invalid input(s): FREET3 Anemia work up No results for input(s): VITAMINB12, FOLATE, FERRITIN, TIBC, IRON, RETICCTPCT in the last 72 hours. Urinalysis    Component Value Date/Time   COLORURINE YELLOW 02/06/2017 1128   APPEARANCEUR HAZY (A) 02/06/2017 1128   LABSPEC 1.018 02/06/2017 1128   PHURINE 5.0 02/06/2017 1128   GLUCOSEU NEGATIVE 02/06/2017 1128   HGBUR SMALL (A) 02/06/2017 1128   BILIRUBINUR NEGATIVE 02/06/2017 1128   KETONESUR 20 (A) 02/06/2017 1128   PROTEINUR 30 (A) 02/06/2017 1128   UROBILINOGEN 0.2 09/09/2012 1501   NITRITE NEGATIVE 02/06/2017 1128   LEUKOCYTESUR MODERATE (A) 02/06/2017 1128   Sepsis Labs Invalid input(s): PROCALCITONIN,  WBC,  LACTICIDVEN   Time coordinating discharge: Over 30 minutes  SIGNED:   Janece Canterbury, MD  [facility] 02/10/2017, 5:21 PM Pager   If 7PM-7AM, please contact night-coverage [email or web address] Password [email or web address]

## 2017-02-13 ENCOUNTER — Other Ambulatory Visit: Payer: Self-pay | Admitting: *Deleted

## 2017-02-13 ENCOUNTER — Telehealth: Payer: Self-pay | Admitting: Family Medicine

## 2017-02-13 DIAGNOSIS — K625 Hemorrhage of anus and rectum: Secondary | ICD-10-CM | POA: Diagnosis not present

## 2017-02-13 DIAGNOSIS — I4891 Unspecified atrial fibrillation: Secondary | ICD-10-CM | POA: Diagnosis not present

## 2017-02-13 DIAGNOSIS — I2510 Atherosclerotic heart disease of native coronary artery without angina pectoris: Secondary | ICD-10-CM | POA: Diagnosis not present

## 2017-02-13 DIAGNOSIS — Z95810 Presence of automatic (implantable) cardiac defibrillator: Secondary | ICD-10-CM | POA: Diagnosis not present

## 2017-02-13 DIAGNOSIS — Z7982 Long term (current) use of aspirin: Secondary | ICD-10-CM | POA: Diagnosis not present

## 2017-02-13 DIAGNOSIS — E119 Type 2 diabetes mellitus without complications: Secondary | ICD-10-CM | POA: Diagnosis not present

## 2017-02-13 DIAGNOSIS — M1990 Unspecified osteoarthritis, unspecified site: Secondary | ICD-10-CM | POA: Diagnosis not present

## 2017-02-13 DIAGNOSIS — I110 Hypertensive heart disease with heart failure: Secondary | ICD-10-CM | POA: Diagnosis not present

## 2017-02-13 DIAGNOSIS — I255 Ischemic cardiomyopathy: Secondary | ICD-10-CM | POA: Diagnosis not present

## 2017-02-13 DIAGNOSIS — J45909 Unspecified asthma, uncomplicated: Secondary | ICD-10-CM | POA: Diagnosis not present

## 2017-02-13 DIAGNOSIS — E785 Hyperlipidemia, unspecified: Secondary | ICD-10-CM | POA: Diagnosis not present

## 2017-02-13 DIAGNOSIS — Z87440 Personal history of urinary (tract) infections: Secondary | ICD-10-CM | POA: Diagnosis not present

## 2017-02-13 DIAGNOSIS — K219 Gastro-esophageal reflux disease without esophagitis: Secondary | ICD-10-CM | POA: Diagnosis not present

## 2017-02-13 DIAGNOSIS — Z87891 Personal history of nicotine dependence: Secondary | ICD-10-CM | POA: Diagnosis not present

## 2017-02-13 DIAGNOSIS — I5022 Chronic systolic (congestive) heart failure: Secondary | ICD-10-CM | POA: Diagnosis not present

## 2017-02-13 NOTE — Telephone Encounter (Signed)
Sent to PCP for approval for verbal orders  

## 2017-02-13 NOTE — Patient Outreach (Signed)
[name]) Care Management  02/13/2017  RAPHEL STICKLES 1932-06-13 [id number]    Rn attempted the initial outreach call post-op hospitalization and was able to briefly speak with the pt who verified identifiers. RN introduced the [facility] services and purpose for today's call. Pt sound SOB and RN inquired further. Pt states he was shopping at [place] today. RN offered to follow up at a more convenient time or day. Pt requested a call tomorrow morning. RN will rescheduled another follow up call for tomorrow and requested pt to have his discharge paperwork available to review (pt was receptive). Will follow up and completed the transition of care template accordingly.   Raina Mina, RN Care Management Coordinator [place] Office [phone]

## 2017-02-13 NOTE — Telephone Encounter (Signed)
Copied from [place]. Topic: Quick Communication - See Telephone Encounter >> Feb 13, 2017  1:59 PM Percell Belt A wrote: CRM for notification. See Telephone encounter for: Claiborne Billings [facility] with [facility] [phone] [place] for PT  2 week 4   02/13/17.

## 2017-02-14 ENCOUNTER — Other Ambulatory Visit: Payer: Self-pay | Admitting: *Deleted

## 2017-02-14 NOTE — Patient Outreach (Signed)
[facility]) Care Management  02/14/2017  ZUHAIR LARICCIA [birth year] [id number]    RN attempted outreach call to pt today as scheduled however unsuccessful. RN able to leave a HIPAA approved voice message and will rescheduled another call this week.  Raina Mina, RN Care Management Coordinator [place] Office [phone]

## 2017-02-15 ENCOUNTER — Other Ambulatory Visit: Payer: Self-pay | Admitting: *Deleted

## 2017-02-15 DIAGNOSIS — I110 Hypertensive heart disease with heart failure: Secondary | ICD-10-CM | POA: Diagnosis not present

## 2017-02-15 DIAGNOSIS — I5022 Chronic systolic (congestive) heart failure: Secondary | ICD-10-CM | POA: Diagnosis not present

## 2017-02-15 DIAGNOSIS — K625 Hemorrhage of anus and rectum: Secondary | ICD-10-CM | POA: Diagnosis not present

## 2017-02-15 DIAGNOSIS — I2510 Atherosclerotic heart disease of native coronary artery without angina pectoris: Secondary | ICD-10-CM | POA: Diagnosis not present

## 2017-02-15 DIAGNOSIS — I255 Ischemic cardiomyopathy: Secondary | ICD-10-CM | POA: Diagnosis not present

## 2017-02-15 DIAGNOSIS — E119 Type 2 diabetes mellitus without complications: Secondary | ICD-10-CM | POA: Diagnosis not present

## 2017-02-15 NOTE — Telephone Encounter (Signed)
Called [name] and left a detailed VM with pt's name and DOB gave her the OK for the verbal orders that were requested for the pt.

## 2017-02-15 NOTE — Patient Outreach (Signed)
[facility]) Care Management  02/15/2017  SMOKEY MELOTT 1932/09/15 [id number]    RN attempted once again outreach call to pt today however unsuccessful and only able to leave a HIPAA approved voice message requesting a call back. Will rescheduled for another outreach tomorrow for pending services with this pt.  Raina Mina, RN Care Management Coordinator [place] Office [phone]

## 2017-02-15 NOTE — Telephone Encounter (Signed)
Please okay the orders for PT

## 2017-02-16 ENCOUNTER — Encounter: Payer: Self-pay | Admitting: *Deleted

## 2017-02-16 ENCOUNTER — Other Ambulatory Visit: Payer: Self-pay | Admitting: *Deleted

## 2017-02-16 NOTE — Patient Outreach (Signed)
[facility]) Care Management  02/16/2017  CHARLES ANDRINGA March 17, 1932 [id number]    Several outreach attempts unsuccessful and today's last attempt remains unsuccessful. RN able t leave HIPAA approved voice message requesting a call back however unsuccessful return calls. Will sent outreach letter and awaiting a timely response. If no response will close this case accordingly.  Raina Mina, RN Care Management Coordinator [place] Office [phone]

## 2017-02-20 DIAGNOSIS — K625 Hemorrhage of anus and rectum: Secondary | ICD-10-CM | POA: Diagnosis not present

## 2017-02-20 DIAGNOSIS — E119 Type 2 diabetes mellitus without complications: Secondary | ICD-10-CM | POA: Diagnosis not present

## 2017-02-20 DIAGNOSIS — I2510 Atherosclerotic heart disease of native coronary artery without angina pectoris: Secondary | ICD-10-CM | POA: Diagnosis not present

## 2017-02-20 DIAGNOSIS — I110 Hypertensive heart disease with heart failure: Secondary | ICD-10-CM | POA: Diagnosis not present

## 2017-02-20 DIAGNOSIS — I255 Ischemic cardiomyopathy: Secondary | ICD-10-CM | POA: Diagnosis not present

## 2017-02-20 DIAGNOSIS — I5022 Chronic systolic (congestive) heart failure: Secondary | ICD-10-CM | POA: Diagnosis not present

## 2017-02-21 ENCOUNTER — Telehealth: Payer: Self-pay

## 2017-02-21 DIAGNOSIS — T82110A Breakdown (mechanical) of cardiac electrode, initial encounter: Secondary | ICD-10-CM

## 2017-02-21 NOTE — Telephone Encounter (Signed)
Call placed to Pt.  Confirmed Pt for lead extraction with lead reimplant on January 31,2019.  Pt to come in for lab work and Materials engineer on February 24, 2017.  Scheduled with OR.  Per ancillary surgeons ok to start at 1400.

## 2017-02-22 DIAGNOSIS — I2510 Atherosclerotic heart disease of native coronary artery without angina pectoris: Secondary | ICD-10-CM | POA: Diagnosis not present

## 2017-02-22 DIAGNOSIS — I110 Hypertensive heart disease with heart failure: Secondary | ICD-10-CM | POA: Diagnosis not present

## 2017-02-22 DIAGNOSIS — E119 Type 2 diabetes mellitus without complications: Secondary | ICD-10-CM | POA: Diagnosis not present

## 2017-02-22 DIAGNOSIS — I255 Ischemic cardiomyopathy: Secondary | ICD-10-CM | POA: Diagnosis not present

## 2017-02-22 DIAGNOSIS — I5022 Chronic systolic (congestive) heart failure: Secondary | ICD-10-CM | POA: Diagnosis not present

## 2017-02-22 DIAGNOSIS — K625 Hemorrhage of anus and rectum: Secondary | ICD-10-CM | POA: Diagnosis not present

## 2017-02-24 ENCOUNTER — Other Ambulatory Visit: Payer: Medicare Other | Admitting: *Deleted

## 2017-02-24 DIAGNOSIS — T82110A Breakdown (mechanical) of cardiac electrode, initial encounter: Secondary | ICD-10-CM | POA: Diagnosis not present

## 2017-02-24 NOTE — Pre-Procedure Instructions (Signed)
[facility]  02/24/2017      [id number] [address], [place] [address] Phone: [phone] Fax: [place], [place] [place] [place] [address] Phone: [phone] Fax: [phone]    Your procedure is scheduled on Thursday, Jan. 31st   Report to [facility] Admitting at [employer]             (posted surgery time 2:00 p - 5:00 p)   Call this number if you have problems the morning of surgery:  [phone]   Remember:  Do not eat food or drink liquids after midnight, Wednesday.   Take these medicines the morning of surgery with A SIP OF WATER : Metoprolol, Zantac   Do not wear jewelry - no rings or watches.  Do not wear lotions, colognes or deodorant.   Men may shave face and neck.   Do not bring valuables to the hospital.  [facility] is not responsible for any belongings or valuables.  Contacts, dentures or bridgework may not be worn into surgery.  Leave your suitcase in the car.  After surgery it may be brought to your room. For patients admitted to the hospital, discharge time will be determined by your treatment team.  Please read over the following fact sheets that you were given. Pain Booklet and Surgical Site Infection Prevention    [place]- Preparing For Surgery  Before surgery, you can play an important role. Because skin is not sterile, your skin needs to be as free of germs as possible. You can reduce the number of germs on your skin by washing with CHG (chlorahexidine gluconate) Soap before surgery.  CHG is an antiseptic cleaner which kills germs and bonds with the skin to continue killing germs even after washing.  Please do not use if you have an allergy to CHG or antibacterial soaps. If your skin becomes reddened/irritated stop using the CHG.  Do not shave (including legs and underarms) for at  least 48 hours prior to first CHG shower. It is OK to shave your face.  Please follow these instructions carefully.   1. Shower the NIGHT BEFORE SURGERY and the MORNING OF SURGERY with CHG.   2. If you chose to wash your hair, wash your hair first as usual with your normal shampoo.  3. After you shampoo, rinse your hair and body thoroughly to remove the shampoo.  4. Use CHG as you would any other liquid soap. You can apply CHG directly to the skin and wash gently with a scrungie or a clean washcloth.   5. Apply the CHG Soap to your body ONLY FROM THE NECK DOWN.  Do not use on open wounds or open sores. Avoid contact with your eyes, ears, mouth and genitals (private parts). Wash Face and genitals (private parts)  with your normal soap.  6. Wash thoroughly, paying special attention to the area where your surgery will be performed.  7. Thoroughly rinse your body with warm water from the neck down.  8. DO NOT shower/wash with your normal soap after using and rinsing off the CHG Soap.  9. Pat yourself dry with a CLEAN TOWEL.  10. Wear CLEAN PAJAMAS to bed the night before surgery, wear comfortable clothes the morning of surgery  11. Place CLEAN SHEETS on your bed the night of your first shower and DO NOT SLEEP  WITH PETS.    Day of Surgery: Do not apply any deodorants/lotions. Please wear clean clothes to the hospital/surgery center.

## 2017-02-25 LAB — BASIC METABOLIC PANEL
BUN / CREAT RATIO: 14 (ref 10–24)
BUN: 20 mg/dL (ref 8–27)
CO2: 24 mmol/L (ref 20–29)
CREATININE: 1.38 mg/dL — AB (ref 0.76–1.27)
Calcium: 9.6 mg/dL (ref 8.6–10.2)
Chloride: 95 mmol/L — ABNORMAL LOW (ref 96–106)
GFR calc Af Amer: 55 mL/min/{1.73_m2} — ABNORMAL LOW (ref >59)
GFR calc non Af Amer: 47 mL/min/{1.73_m2} — ABNORMAL LOW (ref >59)
Glucose: 127 mg/dL — ABNORMAL HIGH (ref 65–99)
POTASSIUM: 5.2 mmol/L (ref 3.5–5.2)
SODIUM: 137 mmol/L (ref 134–144)

## 2017-02-25 LAB — CBC WITH DIFFERENTIAL/PLATELET
BASOS ABS: 0 10*3/uL (ref 0.0–0.2)
Basos: 0 %
EOS (ABSOLUTE): 0.1 10*3/uL (ref 0.0–0.4)
Eos: 1 %
HEMOGLOBIN: 15.2 g/dL (ref 13.0–17.7)
Hematocrit: 44.1 % (ref 37.5–51.0)
IMMATURE GRANULOCYTES: 1 %
Immature Grans (Abs): 0.1 10*3/uL (ref 0.0–0.1)
LYMPHS: 21 %
Lymphocytes Absolute: 2 10*3/uL (ref 0.7–3.1)
MCH: 30.5 pg (ref 26.6–33.0)
MCHC: 34.5 g/dL (ref 31.5–35.7)
MCV: 88 fL (ref 79–97)
MONOCYTES: 24 %
Monocytes Absolute: 2.4 10*3/uL — ABNORMAL HIGH (ref 0.1–0.9)
NEUTROS ABS: 5.3 10*3/uL (ref 1.4–7.0)
NEUTROS PCT: 53 %
Platelets: 171 10*3/uL (ref 150–379)
RBC: 4.99 x10E6/uL (ref 4.14–5.80)
RDW: 15.2 % (ref 12.3–15.4)
WBC: 9.8 10*3/uL (ref 3.4–10.8)

## 2017-02-27 ENCOUNTER — Encounter (HOSPITAL_COMMUNITY): Payer: Self-pay

## 2017-02-27 ENCOUNTER — Other Ambulatory Visit: Payer: Self-pay

## 2017-02-27 ENCOUNTER — Encounter (HOSPITAL_COMMUNITY)
Admission: RE | Admit: 2017-02-27 | Discharge: 2017-02-27 | Disposition: A | Discharge status: Home / Self Care | Payer: Medicare Other | Source: Ambulatory Visit | Attending: Internal Medicine | Admitting: Internal Medicine

## 2017-02-27 DIAGNOSIS — E119 Type 2 diabetes mellitus without complications: Secondary | ICD-10-CM | POA: Diagnosis not present

## 2017-02-27 DIAGNOSIS — K625 Hemorrhage of anus and rectum: Secondary | ICD-10-CM | POA: Diagnosis not present

## 2017-02-27 DIAGNOSIS — I5022 Chronic systolic (congestive) heart failure: Secondary | ICD-10-CM | POA: Diagnosis not present

## 2017-02-27 DIAGNOSIS — I255 Ischemic cardiomyopathy: Secondary | ICD-10-CM | POA: Diagnosis not present

## 2017-02-27 DIAGNOSIS — I2510 Atherosclerotic heart disease of native coronary artery without angina pectoris: Secondary | ICD-10-CM | POA: Diagnosis not present

## 2017-02-27 DIAGNOSIS — I110 Hypertensive heart disease with heart failure: Secondary | ICD-10-CM | POA: Diagnosis not present

## 2017-02-27 LAB — BASIC METABOLIC PANEL
Anion gap: 14 (ref 5–15)
BUN: 15 mg/dL (ref 6–20)
CALCIUM: 9.8 mg/dL (ref 8.9–10.3)
CO2: 23 mmol/L (ref 22–32)
CREATININE: 1.39 mg/dL — AB (ref 0.61–1.24)
Chloride: 100 mmol/L — ABNORMAL LOW (ref 101–111)
GFR calc Af Amer: 53 mL/min — ABNORMAL LOW (ref >60)
GFR, EST NON AFRICAN AMERICAN: 46 mL/min — AB (ref >60)
GLUCOSE: 144 mg/dL — AB (ref 65–99)
POTASSIUM: 4.6 mmol/L (ref 3.5–5.1)
Sodium: 137 mmol/L (ref 135–145)

## 2017-02-27 LAB — SURGICAL PCR SCREEN
MRSA, PCR: NEGATIVE
STAPHYLOCOCCUS AUREUS: POSITIVE — AB

## 2017-02-27 LAB — CBC
HEMATOCRIT: 47.4 % (ref 39.0–52.0)
Hemoglobin: 15.9 g/dL (ref 13.0–17.0)
MCH: 30.3 pg (ref 26.0–34.0)
MCHC: 33.5 g/dL (ref 30.0–36.0)
MCV: 90.5 fL (ref 78.0–100.0)
Platelets: 167 10*3/uL (ref 150–400)
RBC: 5.24 MIL/uL (ref 4.22–5.81)
RDW: 15.8 % — AB (ref 11.5–15.5)
WBC: 9.7 10*3/uL (ref 4.0–10.5)

## 2017-02-27 NOTE — [facility] Notes (Signed)
PCP - Alysia Penna Cardiologist - Lovena Le  Chest x-ray - 12/2016 EKG -01/2017  Stress Test - patient could not remember ECHO - 2014 Cardiac Cath - 2004   Blood Thinner Instructions: denies being on blood thinner   Anesthesia review: YES  Patient denies shortness of breath, fever, cough and chest pain at PAT appointment   Patient verbalized understanding of instructions that were given to them at the PAT appointment. Patient was also instructed that they will need to review over the PAT instructions again at home before surgery.

## 2017-02-28 ENCOUNTER — Inpatient Hospital Stay: Payer: Medicare Other | Admitting: Family Medicine

## 2017-02-28 DIAGNOSIS — Z0289 Encounter for other administrative examinations: Secondary | ICD-10-CM

## 2017-02-28 NOTE — [facility] Notes (Signed)
Anesthesia Chart Review: Patient is a 84 year old male scheduled for implantable cardioverter defibrillator lead extraction with new lead implant on 03/02/17. (CT surgeon back up is Dr. Darylene Price from 2-5 PM and after 5 PM, Dr. Lanelle Bal.) Procedure was initially scheduled for 12/15/16, but patient cancelled due to not feeling well. He was seen by his PCP and several ED visits for epigastric pain. On 02/06/17, he was actually admitted to [facility] for abdominal pain with acute proctitis and afib with RVR. GI Dr. Henrene Pastor and EP Dr. Lovena Le consulted. Digoxin added for afib. Solitary distal rectum ulcer noted on sigmoidoscopy and PPI prescribed. He was discharged home on 02/10/17. Urine culture showed insignificant growth.  Other history includes former smoker (quit '78), MI '83 and '90, CAD, (CABG '83, stent '04) ischemic cardiomyopathy, chronic systolic CHF, VT (~ 10 years ago), Medtronic ICD (originally place 1990 in [employer]/t  VT; generator 04/08/16), afib (no anticoagulation d/t SDH history), HTN, dyslipidemia, DM2, GERD, erythrocytosis, asthma, SDH 08/2011, cholecystectomy, tonsillectomy. Hospitalized 11/10/16 - 11/14/16 for fractured ICD lead. Replacement of lead was considered, but ultimately postponed until he could be re-evaluated by Dr. Lovena Le to determine if lead replacement was appropriate.   - PCP is Dr. Alysia Penna.  - Primary cardiologist is Dr. Peter [name]. EP cardiologist is Dr. Cristopher Peru. - HEM-ONC is Dr. 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 Continued yearly monitoring recommended.   Meds include Advair, digoxin, Flonase, Lasix, Neurontin, Linzess, Claritin, Toprol XL, Dulera, Nitro, KCl, Zantac, tramadol.   BP (!) 153/81   Pulse 95   Temp (!) 36.3 C   Resp 18   Ht 6' (1.829 m)   Wt 180 lb 8 oz (81.9 kg)   SpO2 98%   BMI 24.48 kg/m   EKG 02/06/17: Afib at 135 bpm, borderline LAD, low voltage, extremity leads, non-specific T  abnormalities, lateral leads. Prolonged QT interval (QT 335 ms, QTc 503 ms). HR was 95 bpm at PAT.  Echo 09/10/12: Impressions: - Extremely poor acoustic windows Structures are not seen well enough to evaluate function. Would reorder when patient can cooperate/move to help optimize.  Echo 08/13/11: Study Conclusions - Left ventricle: Technically severely limited study.There is motion of the apical septal and apical lateral walls. The anterior septum and inferior septum seem to move.The inferior wall and inferolateral walls can not be assessed. There is motion of the mid-lateral segment, but hypokinesis of the basal lateral segment. Anterior wall cannot be assessed. The EF can not be estimated. The cavity size was mildly to moderately dilated. - Aortic valve: Poorly visualized. The valve appears to be grossly normal. - Mitral valve: Poorly visualized. The valve appears to be grossly normal. - Right ventricle: Pacer wire or catheter noted in right ventricle. (TTE 05/14/09: LVEF 35-40%.)  Cardiac cath 10/17/06: - 3V native CAD.  - SVG-LAD His SVG to his LAD was patent.was widely patent with an excellent anastomosis to the mid-LAD after the second diagonal branch. There wasretrograde filling of two moderate size diagonal branches. The mostsuperior one had a 60-70% narrowing in the proximal portion which wasunchanged. Retrograde to this, there was 70% narrowing of the LADbefore a large septal perforator. The septal perforator trifurcated andprovided collaterals to a large RV bifurcating marginal branch and tothe distal circumflex. The distal LAD provided collaterals to thecircumflex.did have -  The previously placed tandem DESCypher stents from October 29, 2002 were widely patent with less than 10% narrowing. Several small diagonal branches were visualized in the  distal third of the LAD. This was essentially unchanged from post- stenting angiogram  after PCI of October 29, 2002. - RCA totally occluded in its proximal third with no antegrade filling. - EF ~ 40%. Moderately severe global hypokinesis. Akinesis of a small area of the apex and hypokinesis of the basilar third of the inferior wall.  - Medical treatment.  Carotid U/S 09/10/12: Summary: Mild intimal thickening with mild plaque in bifurcation areas. No significant ICA stenosis, 1-39% stenosis.  CXR 12/23/16: IMPRESSION: No active cardiopulmonary disease.  Aortic atherosclerosis.  Preoperative labs noted. Cr 1.39 (previously 1.15-1.48 since 12/08/16), glucose 144, H/H 15.9/47.4. PLT 167K. Orders pending from Dr. Lovena Le, so may need additional orders on the day of surgery. I will at least enter order for T&S.    Based on currently available information, I anticipate that he can proceed as planned if no acute changes.  [facility] Short Stay Center/Anesthesiology Phone [phone] 02/28/2017 3:23 PM

## 2017-03-01 DIAGNOSIS — I110 Hypertensive heart disease with heart failure: Secondary | ICD-10-CM | POA: Diagnosis not present

## 2017-03-01 DIAGNOSIS — I255 Ischemic cardiomyopathy: Secondary | ICD-10-CM | POA: Diagnosis not present

## 2017-03-01 DIAGNOSIS — I5022 Chronic systolic (congestive) heart failure: Secondary | ICD-10-CM | POA: Diagnosis not present

## 2017-03-01 DIAGNOSIS — E119 Type 2 diabetes mellitus without complications: Secondary | ICD-10-CM | POA: Diagnosis not present

## 2017-03-01 DIAGNOSIS — I2510 Atherosclerotic heart disease of native coronary artery without angina pectoris: Secondary | ICD-10-CM | POA: Diagnosis not present

## 2017-03-01 DIAGNOSIS — K625 Hemorrhage of anus and rectum: Secondary | ICD-10-CM | POA: Diagnosis not present

## 2017-03-02 ENCOUNTER — Encounter (HOSPITAL_COMMUNITY)
Admission: RE | Disposition: A | Discharge status: Skilled Nursing Facility | Payer: Self-pay | Source: Ambulatory Visit | Attending: Internal Medicine

## 2017-03-02 ENCOUNTER — Inpatient Hospital Stay (HOSPITAL_COMMUNITY): Payer: Medicare Other | Admitting: Vascular Surgery

## 2017-03-02 ENCOUNTER — Inpatient Hospital Stay (HOSPITAL_COMMUNITY)
Admission: RE | Admit: 2017-03-02 | Discharge: 2017-03-05 | DRG: 265 | Disposition: A | Discharge status: Skilled Nursing Facility | Payer: Medicare Other | Source: Ambulatory Visit | Attending: Internal Medicine | Admitting: Internal Medicine

## 2017-03-02 ENCOUNTER — Other Ambulatory Visit: Payer: Self-pay

## 2017-03-02 ENCOUNTER — Inpatient Hospital Stay (HOSPITAL_COMMUNITY): Payer: Medicare Other | Admitting: Emergency Medicine

## 2017-03-02 ENCOUNTER — Encounter (HOSPITAL_COMMUNITY): Payer: Self-pay | Admitting: *Deleted

## 2017-03-02 ENCOUNTER — Inpatient Hospital Stay (HOSPITAL_COMMUNITY): Payer: Medicare Other

## 2017-03-02 DIAGNOSIS — I130 Hypertensive heart and chronic kidney disease with heart failure and stage 1 through stage 4 chronic kidney disease, or unspecified chronic kidney disease: Secondary | ICD-10-CM | POA: Diagnosis present

## 2017-03-02 DIAGNOSIS — K219 Gastro-esophageal reflux disease without esophagitis: Secondary | ICD-10-CM | POA: Diagnosis present

## 2017-03-02 DIAGNOSIS — J45909 Unspecified asthma, uncomplicated: Secondary | ICD-10-CM | POA: Diagnosis not present

## 2017-03-02 DIAGNOSIS — I255 Ischemic cardiomyopathy: Secondary | ICD-10-CM | POA: Diagnosis not present

## 2017-03-02 DIAGNOSIS — Z951 Presence of aortocoronary bypass graft: Secondary | ICD-10-CM

## 2017-03-02 DIAGNOSIS — T82110D Breakdown (mechanical) of cardiac electrode, subsequent encounter: Secondary | ICD-10-CM | POA: Diagnosis not present

## 2017-03-02 DIAGNOSIS — T82110A Breakdown (mechanical) of cardiac electrode, initial encounter: Secondary | ICD-10-CM | POA: Diagnosis present

## 2017-03-02 DIAGNOSIS — E1122 Type 2 diabetes mellitus with diabetic chronic kidney disease: Secondary | ICD-10-CM | POA: Diagnosis not present

## 2017-03-02 DIAGNOSIS — Y848 Other medical procedures as the cause of abnormal reaction of the patient, or of later complication, without mention of misadventure at the time of the procedure: Secondary | ICD-10-CM | POA: Diagnosis present

## 2017-03-02 DIAGNOSIS — I472 Ventricular tachycardia, unspecified: Secondary | ICD-10-CM

## 2017-03-02 DIAGNOSIS — I110 Hypertensive heart disease with heart failure: Secondary | ICD-10-CM | POA: Diagnosis not present

## 2017-03-02 DIAGNOSIS — T82118A Breakdown (mechanical) of other cardiac electronic device, initial encounter: Principal | ICD-10-CM | POA: Diagnosis present

## 2017-03-02 DIAGNOSIS — I252 Old myocardial infarction: Secondary | ICD-10-CM | POA: Diagnosis not present

## 2017-03-02 DIAGNOSIS — I2510 Atherosclerotic heart disease of native coronary artery without angina pectoris: Secondary | ICD-10-CM | POA: Diagnosis present

## 2017-03-02 DIAGNOSIS — I5022 Chronic systolic (congestive) heart failure: Secondary | ICD-10-CM | POA: Diagnosis not present

## 2017-03-02 DIAGNOSIS — I482 Chronic atrial fibrillation, unspecified: Secondary | ICD-10-CM | POA: Diagnosis present

## 2017-03-02 DIAGNOSIS — M1990 Unspecified osteoarthritis, unspecified site: Secondary | ICD-10-CM | POA: Diagnosis not present

## 2017-03-02 DIAGNOSIS — R2681 Unsteadiness on feet: Secondary | ICD-10-CM | POA: Diagnosis not present

## 2017-03-02 DIAGNOSIS — Z87891 Personal history of nicotine dependence: Secondary | ICD-10-CM

## 2017-03-02 DIAGNOSIS — E785 Hyperlipidemia, unspecified: Secondary | ICD-10-CM | POA: Diagnosis not present

## 2017-03-02 DIAGNOSIS — I4891 Unspecified atrial fibrillation: Secondary | ICD-10-CM | POA: Diagnosis not present

## 2017-03-02 DIAGNOSIS — I499 Cardiac arrhythmia, unspecified: Secondary | ICD-10-CM | POA: Diagnosis not present

## 2017-03-02 DIAGNOSIS — Y710 Diagnostic and monitoring cardiovascular devices associated with adverse incidents: Secondary | ICD-10-CM | POA: Diagnosis present

## 2017-03-02 DIAGNOSIS — E119 Type 2 diabetes mellitus without complications: Secondary | ICD-10-CM | POA: Diagnosis not present

## 2017-03-02 DIAGNOSIS — N183 Chronic kidney disease, stage 3 (moderate): Secondary | ICD-10-CM | POA: Diagnosis not present

## 2017-03-02 DIAGNOSIS — Z95810 Presence of automatic (implantable) cardiac defibrillator: Secondary | ICD-10-CM | POA: Diagnosis not present

## 2017-03-02 DIAGNOSIS — R488 Other symbolic dysfunctions: Secondary | ICD-10-CM | POA: Diagnosis not present

## 2017-03-02 DIAGNOSIS — Z7901 Long term (current) use of anticoagulants: Secondary | ICD-10-CM

## 2017-03-02 DIAGNOSIS — R079 Chest pain, unspecified: Secondary | ICD-10-CM | POA: Diagnosis not present

## 2017-03-02 DIAGNOSIS — K625 Hemorrhage of anus and rectum: Secondary | ICD-10-CM | POA: Diagnosis not present

## 2017-03-02 DIAGNOSIS — M6281 Muscle weakness (generalized): Secondary | ICD-10-CM | POA: Diagnosis not present

## 2017-03-02 DIAGNOSIS — R410 Disorientation, unspecified: Secondary | ICD-10-CM

## 2017-03-02 DIAGNOSIS — Z5189 Encounter for other specified aftercare: Secondary | ICD-10-CM | POA: Diagnosis not present

## 2017-03-02 DIAGNOSIS — I4729 Other ventricular tachycardia: Secondary | ICD-10-CM

## 2017-03-02 DIAGNOSIS — I427 Cardiomyopathy due to drug and external agent: Secondary | ICD-10-CM | POA: Diagnosis not present

## 2017-03-02 DIAGNOSIS — Z87440 Personal history of urinary (tract) infections: Secondary | ICD-10-CM | POA: Diagnosis not present

## 2017-03-02 HISTORY — PX: LEAD INSERTION: EP1212

## 2017-03-02 HISTORY — PX: ICD LEAD REMOVAL: SHX5855

## 2017-03-02 HISTORY — DX: Delirium due to known physiological condition: F05

## 2017-03-02 LAB — GLUCOSE, CAPILLARY: GLUCOSE-CAPILLARY: 96 mg/dL (ref 65–99)

## 2017-03-02 LAB — TYPE AND SCREEN
ABO/RH(D): O POS
ANTIBODY SCREEN: NEGATIVE

## 2017-03-02 SURGERY — REMOVAL, ELECTRODE LEAD, ICD
Anesthesia: General | Site: Chest

## 2017-03-02 MED ORDER — LIDOCAINE HCL (PF) 1 % IJ SOLN
INTRAMUSCULAR | Status: DC | PRN
  Administered 2017-03-02: 20 mL

## 2017-03-02 MED ORDER — PROPOFOL 10 MG/ML IV BOLUS
INTRAVENOUS | Status: AC
  Filled 2017-03-02: qty 20

## 2017-03-02 MED ORDER — HALOPERIDOL LACTATE 5 MG/ML IJ SOLN
INTRAMUSCULAR | Status: AC
  Administered 2017-03-02: 5 mg via INTRAVENOUS
  Filled 2017-03-02: qty 1

## 2017-03-02 MED ORDER — POLYETHYLENE GLYCOL 3350 17 G PO PACK: 17.0000 g | PACK | Freq: Every day | ORAL | Status: DC | PRN

## 2017-03-02 MED ORDER — CEFAZOLIN SODIUM-DEXTROSE 2-4 GM/100ML-% IV SOLN
INTRAVENOUS | Status: AC
  Filled 2017-03-02: qty 100

## 2017-03-02 MED ORDER — POTASSIUM CHLORIDE CRYS ER 10 MEQ PO TBCR
10.0000 meq | EXTENDED_RELEASE_TABLET | Freq: Every day | ORAL | Status: DC
  Administered 2017-03-02 – 2017-03-05 (×4): 10 meq via ORAL
  Filled 2017-03-02 (×4): qty 1

## 2017-03-02 MED ORDER — DIGOXIN 125 MCG PO TABS
0.1250 mg | ORAL_TABLET | Freq: Every day | ORAL | Status: DC
  Administered 2017-03-02 – 2017-03-05 (×4): 0.125 mg via ORAL
  Filled 2017-03-02 (×4): qty 1

## 2017-03-02 MED ORDER — SODIUM CHLORIDE 0.9 % IV SOLN
INTRAVENOUS | Status: DC | PRN
  Administered 2017-03-02: 15:00:00

## 2017-03-02 MED ORDER — PHENYLEPHRINE HCL 10 MG/ML IJ SOLN
INTRAMUSCULAR | Status: DC | PRN
  Administered 2017-03-02: 40 ug via INTRAVENOUS

## 2017-03-02 MED ORDER — LIDOCAINE HCL (PF) 1 % IJ SOLN
INTRAMUSCULAR | Status: AC
  Filled 2017-03-02: qty 30

## 2017-03-02 MED ORDER — FENTANYL CITRATE (PF) 250 MCG/5ML IJ SOLN
INTRAMUSCULAR | Status: AC
Start: 2017-03-02 — End: ?
  Filled 2017-03-02: qty 5

## 2017-03-02 MED ORDER — EPHEDRINE SULFATE 50 MG/ML IJ SOLN
INTRAMUSCULAR | Status: DC | PRN
  Administered 2017-03-02: 15 mg via INTRAVENOUS
  Administered 2017-03-02: 10 mg via INTRAVENOUS

## 2017-03-02 MED ORDER — HALOPERIDOL LACTATE 5 MG/ML IJ SOLN
5.0000 mg | Freq: Once | INTRAMUSCULAR | Status: AC
  Administered 2017-03-02: 5 mg via INTRAVENOUS

## 2017-03-02 MED ORDER — MUPIROCIN 2 % EX OINT
1.0000 "application " | TOPICAL_OINTMENT | Freq: Two times a day (BID) | CUTANEOUS | Status: DC
  Administered 2017-03-02: 1 via TOPICAL

## 2017-03-02 MED ORDER — CEFAZOLIN SODIUM-DEXTROSE 1-4 GM/50ML-% IV SOLN
1.0000 g | Freq: Four times a day (QID) | INTRAVENOUS | Status: AC
  Administered 2017-03-02 – 2017-03-03 (×3): 1 g via INTRAVENOUS
  Filled 2017-03-02 (×3): qty 50

## 2017-03-02 MED ORDER — ONDANSETRON HCL 4 MG/2ML IJ SOLN: 4.0000 mg | Freq: Four times a day (QID) | INTRAMUSCULAR | Status: DC | PRN

## 2017-03-02 MED ORDER — LINACLOTIDE 72 MCG PO CAPS
72.0000 ug | ORAL_CAPSULE | Freq: Every day | ORAL | Status: DC | PRN
  Filled 2017-03-02: qty 1

## 2017-03-02 MED ORDER — ESMOLOL HCL 100 MG/10ML IV SOLN
INTRAVENOUS | Status: DC | PRN
  Administered 2017-03-02: 40 mg via INTRAVENOUS

## 2017-03-02 MED ORDER — LIDOCAINE HCL (CARDIAC) 20 MG/ML IV SOLN
INTRAVENOUS | Status: DC | PRN
  Administered 2017-03-02: 100 mg via INTRAVENOUS

## 2017-03-02 MED ORDER — LORATADINE 10 MG PO TABS: 10.0000 mg | ORAL_TABLET | Freq: Every day | ORAL | Status: DC | PRN

## 2017-03-02 MED ORDER — LACTATED RINGERS IV SOLN
INTRAVENOUS | Status: DC
Start: 2017-03-02 — End: 2017-03-02
  Administered 2017-03-02: 09:00:00 via INTRAVENOUS

## 2017-03-02 MED ORDER — PHENYLEPHRINE HCL 10 MG/ML IJ SOLN
INTRAMUSCULAR | Status: DC | PRN
  Administered 2017-03-02: 20 ug/min via INTRAVENOUS

## 2017-03-02 MED ORDER — MOMETASONE FURO-FORMOTEROL FUM 200-5 MCG/ACT IN AERO
2.0000 | INHALATION_SPRAY | Freq: Two times a day (BID) | RESPIRATORY_TRACT | Status: DC
  Administered 2017-03-02 – 2017-03-05 (×6): 2 via RESPIRATORY_TRACT
  Filled 2017-03-02: qty 8.8

## 2017-03-02 MED ORDER — LACTATED RINGERS IV SOLN
INTRAVENOUS | Status: DC | PRN
  Administered 2017-03-02: 13:00:00 via INTRAVENOUS

## 2017-03-02 MED ORDER — SUGAMMADEX SODIUM 200 MG/2ML IV SOLN
INTRAVENOUS | Status: DC | PRN
  Administered 2017-03-02: 200 mg via INTRAVENOUS

## 2017-03-02 MED ORDER — MUPIROCIN 2 % EX OINT
TOPICAL_OINTMENT | CUTANEOUS | Status: AC
  Filled 2017-03-02: qty 22

## 2017-03-02 MED ORDER — ACETAMINOPHEN 325 MG PO TABS
325.0000 mg | ORAL_TABLET | ORAL | Status: DC | PRN
  Administered 2017-03-02 – 2017-03-04 (×4): 650 mg via ORAL
  Administered 2017-03-04: 325 mg via ORAL
  Administered 2017-03-05 (×2): 650 mg via ORAL
  Filled 2017-03-02 (×7): qty 2

## 2017-03-02 MED ORDER — CALCIUM CARBONATE ANTACID 500 MG PO CHEW
1.0000 | CHEWABLE_TABLET | Freq: Every day | ORAL | Status: DC | PRN
  Administered 2017-03-04: 200 mg via ORAL
  Filled 2017-03-02: qty 1

## 2017-03-02 MED ORDER — ROCURONIUM BROMIDE 100 MG/10ML IV SOLN
INTRAVENOUS | Status: DC | PRN
  Administered 2017-03-02: 20 mg via INTRAVENOUS
  Administered 2017-03-02: 50 mg via INTRAVENOUS

## 2017-03-02 MED ORDER — FENTANYL CITRATE (PF) 100 MCG/2ML IJ SOLN
INTRAMUSCULAR | Status: DC | PRN
  Administered 2017-03-02 (×2): 50 ug via INTRAVENOUS
  Administered 2017-03-02: 100 ug via INTRAVENOUS

## 2017-03-02 MED ORDER — CEFAZOLIN SODIUM-DEXTROSE 2-3 GM-%(50ML) IV SOLR
INTRAVENOUS | Status: DC | PRN
  Administered 2017-03-02: 2 g via INTRAVENOUS

## 2017-03-02 MED ORDER — ONDANSETRON HCL 4 MG/2ML IJ SOLN
INTRAMUSCULAR | Status: DC | PRN
  Administered 2017-03-02: 4 mg via INTRAVENOUS

## 2017-03-02 MED ORDER — METOPROLOL SUCCINATE ER 25 MG PO TB24
25.0000 mg | ORAL_TABLET | Freq: Every day | ORAL | Status: DC
  Administered 2017-03-03 – 2017-03-05 (×3): 25 mg via ORAL
  Filled 2017-03-02 (×3): qty 1

## 2017-03-02 MED ORDER — PROPOFOL 10 MG/ML IV BOLUS
INTRAVENOUS | Status: DC | PRN
  Administered 2017-03-02: 70 mg via INTRAVENOUS

## 2017-03-02 MED ORDER — SODIUM CHLORIDE 0.9 % IR SOLN
Status: AC
  Administered 2017-03-02: 15:00:00
  Filled 2017-03-02: qty 2

## 2017-03-02 SURGICAL SUPPLY — 63 items
ACCESS TOOL PINCH ON ×2 IMPLANT
ADH SKN CLS APL DERMABOND .7 (GAUZE/BANDAGES/DRESSINGS) ×2
BAG BANDED W/RUBBER/TAPE 36X54 (MISCELLANEOUS) IMPLANT
BAG DECANTER FOR FLEXI CONT (MISCELLANEOUS) IMPLANT
BAG EQP BAND 135X91 W/RBR TAPE (MISCELLANEOUS)
BLADE 10 SAFETY STRL DISP (BLADE) ×4 IMPLANT
BLADE CLIPPER SURG (BLADE) IMPLANT
BLADE OSCILLATING /SAGITTAL (BLADE) ×2 IMPLANT
BLADE STERNUM SYSTEM 6 (BLADE) IMPLANT
BNDG ADH 5X4 AIR PERM ELC (GAUZE/BANDAGES/DRESSINGS) ×2
BNDG COHESIVE 4X5 WHT NS (GAUZE/BANDAGES/DRESSINGS) ×4 IMPLANT
CANISTER SUCT 3000ML PPV (MISCELLANEOUS) ×4 IMPLANT
CLOSURE STERI-STRIP 1/2X4 (GAUZE/BANDAGES/DRESSINGS) ×1
CLSR STERI-STRIP ANTIMIC 1/2X4 (GAUZE/BANDAGES/DRESSINGS) ×1 IMPLANT
COIL ONE TIE COMPRESSION (MISCELLANEOUS) ×4 IMPLANT
COVER BACK TABLE 60X90IN (DRAPES) ×4 IMPLANT
DERMABOND ADVANCED (GAUZE/BANDAGES/DRESSINGS) ×2
DERMABOND ADVANCED .7 DNX12 (GAUZE/BANDAGES/DRESSINGS) IMPLANT
DRAPE C-ARM 42X72 X-RAY (DRAPES) IMPLANT
DRAPE CARDIOVASCULAR INCISE (DRAPES) ×4
DRAPE HALF SHEET 40X57 (DRAPES) ×8 IMPLANT
DRAPE INCISE IOBAN 66X45 STRL (DRAPES) IMPLANT
DRAPE SRG 135X102X78XABS (DRAPES) ×2 IMPLANT
DRSG TEGADERM 4X4.75 (GAUZE/BANDAGES/DRESSINGS) ×4 IMPLANT
ELECT REM PT RETURN 9FT ADLT (ELECTROSURGICAL) ×8
ELECTRODE REM PT RTRN 9FT ADLT (ELECTROSURGICAL) ×4 IMPLANT
FELT TEFLON 1X6 (MISCELLANEOUS) IMPLANT
GAUZE SPONGE 4X4 12PLY STRL (GAUZE/BANDAGES/DRESSINGS) ×6 IMPLANT
GAUZE SPONGE 4X4 16PLY XRAY LF (GAUZE/BANDAGES/DRESSINGS) IMPLANT
GLOVE BIOGEL PI IND STRL 7.5 (GLOVE) ×2 IMPLANT
GLOVE BIOGEL PI INDICATOR 7.5 (GLOVE) ×2
GLOVE ECLIPSE 8.0 STRL XLNG CF (GLOVE) ×4 IMPLANT
GOWN STRL REUS W/ TWL LRG LVL3 (GOWN DISPOSABLE) ×2 IMPLANT
GOWN STRL REUS W/ TWL XL LVL3 (GOWN DISPOSABLE) ×2 IMPLANT
GOWN STRL REUS W/TWL LRG LVL3 (GOWN DISPOSABLE) ×4
GOWN STRL REUS W/TWL XL LVL3 (GOWN DISPOSABLE) ×4
GUIDEWIRE ANGLED .035X150CM (WIRE) ×2 IMPLANT
KIT ROOM TURNOVER OR (KITS) ×4 IMPLANT
LEAD SPRINT QUAT SEC 6935-65CM (Lead) ×2 IMPLANT
Medtronic Pin-Plug Kit 6719 ×2 IMPLANT
NDL PERC 18GX7CM (NEEDLE) IMPLANT
NEEDLE PERC 18GX7CM (NEEDLE) ×4 IMPLANT
NS IRRIG 1000ML POUR BTL (IV SOLUTION) IMPLANT
PACEMAKER PLUG DF-1 (MISCELLANEOUS) ×1 IMPLANT
PAD ARMBOARD 7.5X6 YLW CONV (MISCELLANEOUS) ×8 IMPLANT
PAD ELECT DEFIB RADIOL ZOLL (MISCELLANEOUS) ×4 IMPLANT
SHEATH CLASSIC 9F (SHEATH) ×2 IMPLANT
SHEATH EVOLUTION RL 11F (SHEATH) ×2 IMPLANT
SHEATH EVOLUTION SHORTE RL 11F (SHEATH) ×2 IMPLANT
SHEATH PINNACLE 6F 10CM (SHEATH) ×2 IMPLANT
STYLET LIBERATOR LOCKING (MISCELLANEOUS) ×4 IMPLANT
SUT PROLENE 2 0 CT2 30 (SUTURE) IMPLANT
SUT PROLENE 2 0 SH DA (SUTURE) IMPLANT
SUT VIC AB 2-0 CT1 27 (SUTURE) ×8
SUT VIC AB 2-0 CT1 TAPERPNT 27 (SUTURE) IMPLANT
SUT VIC AB 2-0 CT2 18 VCP726D (SUTURE) IMPLANT
SUT VIC AB 3-0 X1 27 (SUTURE) ×4 IMPLANT
TOWEL OR 17X24 6PK STRL BLUE (TOWEL DISPOSABLE) ×8 IMPLANT
TOWEL OR 17X26 10 PK STRL BLUE (TOWEL DISPOSABLE) ×8 IMPLANT
TRAY FOLEY W/METER SILVER 16FR (SET/KITS/TRAYS/PACK) ×4 IMPLANT
TUBE CONNECTING 12'X1/4 (SUCTIONS)
TUBE CONNECTING 12X1/4 (SUCTIONS) IMPLANT
YANKAUER SUCT BULB TIP NO VENT (SUCTIONS) IMPLANT

## 2017-03-02 NOTE — Op Note (Signed)
EP procedure note  Procedure performed: Extraction of a previously implanted atrial and defibrillation lead secondary to nonfunction of the defibrillation lead in the setting of an occluded left subclavian vein, and insertion of a new active-fixation defibrillation lead.  Preoperative diagnosis: Broken ICD lead in the setting of an ischemic cardiomyopathy and chronic systolic heart failure, and ventricular tachycardia  Postoperative diagnosis: Same as preoperative diagnosis  Description of the procedure: After informed consent was obtained, the patient was taken to the operating room in the fasting state.  The anesthesia service was utilized to provide general endotracheal anesthesia.  Invasive arterial monitoring was also provided by the anesthesia service with a catheter in the left radial artery.  After the usual timeout, a 6 French sheath was placed percutaneously in the right femoral vein.  Attention was then turned to the ICD pocket.  The left subclavian vein was initially punctured, but the Glidewire could not be advanced through the occluded left subclavian vein.  At this point attention was turned to removal of the old atrial and defibrillation lead.  20 cc of lidocaine was infiltrated into the left pectoral region.  A 7 cm incision was made and electrocautery utilized to dissect down to the pectoralis major muscle.  The fibrous scar tissue in the muscle was reexcised and the area underneath the pectoralis major was opened and the old Medtronic single-chamber ICD was removed with gentle traction.  The ICD was disconnected from the defibrillator lead.  The dense fibrous scar tissue was removed with electrocautery and the old atrial and defibrillator lead were freed up from the dense fibrous scar tissue.  The atrial lead had been capped because of the patient's atrial fibrillation.  The stylette was inserted into the lead and the helix retracted.  The lead was cut.  A Cook liberator locking stylette  was inserted into the lead and advanced to the right atrial lead tip.  The [place] 1 tie proximal suture was placed at the proximal portion of the lead.  The [facility] 11 [place] short RL dissection sheath was advanced over the lead and gentle traction placed on the lead, and it was removed in total.  At this point attention was turned to removal of the defibrillator lead.  A stylet was first inserted into the lead and attempts to retract the lead helix were unsuccessful.  The lead was cut.  The [facility] liberator locking stylette was inserted into the body of the lead advanced to the tip of the lead in the right ventricle.  The proximal portion of the lead was also secured with a Cook 1 tie suture.  At this point, the [facility] short RL dissection sheath was advanced over the defibrillator lead all the way to the junction of the innominate vein.  The short Cook dissection sheath was removed and the Cook RL dissection sheath was advanced over the liberator locking stylette and lead body and into the left subclavian vein.  Traction and countertraction as well as pressure and counterpressure were applied to the body of the lead.  There is dense fibrous scar tissue at the subclavian and innominate vein junction as well as along the innominate vein and superior vena cava junction particularly at the distal portion of the proximal shocking coil.  With gentle traction, the distal portion of the lead was pulled free of the right ventricle.  There was dense scar tissue still present in the outer sheath was advanced over the proximal and distal coils until the lead was removed in  total.  The outer sheath was maintained in the superior vena cava.  At this point the Glidewire was inserted through the 11 French outer sheath and the outer sheath was removed.  Pressure was held and then a 9 [place] long peel-away sheath was inserted over the Glidewire into the central circulation.  The Medtronic active fixation defibrillation lead, serial  [id number], was advanced through the 9 French sheath and into the right ventricle.  Along the right ventricular apical septum, R waves measured 14 mV, the lead was actively fixed and the pacing impedance was 6 7 4  ohms.  The pacing threshold 0.5 V at 0.5 ms.  The lead was secured to the fascia with silk suture.  The sewing sleeve was also secured with silk suture.  At this point, the old Medtronic defibrillator which had adequate battery longevity was reconnected to the new defibrillator lead and placed in the old subpectoral pocket.  The pocket was irrigated extensively with antibiotic irrigation.  The incision was closed with 3 layers of Vicryl suture.  Benzoin and Steri-Strips were painted on the skin and the patient returned to the recovery area in stable condition.  The old right femoral venous sheath was removed and pressure held and hemostasis assured.  Complications: There were no immediate procedure complications  Conclusion: Successful extraction of a previously implanted atrial as well as active-fixation defibrillation lead with insertion of a new defibrillation lead using the access to the superior vena cava by way of the extraction sheath.  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  Cristopher Peru, MD

## 2017-03-02 NOTE — Anesthesia Procedure Notes (Signed)
Arterial Line Insertion Start/End1/31/2019 1:00 PM Performed by: Verdie Drown, CRNA, CRNA  Patient location: Pre-op. Preanesthetic checklist: patient identified, IV checked, site marked, risks and benefits discussed, surgical consent, monitors and equipment checked, pre-op evaluation, timeout performed and anesthesia consent Lidocaine 1% used for infiltration Left, radial was placed Catheter size: 20 G Hand hygiene performed  and maximum sterile barriers used  Allen's test indicative of satisfactory collateral circulation Attempts: 1 Procedure performed without using ultrasound guided technique. Following insertion, Biopatch and dressing applied. Post procedure assessment: normal  Patient tolerated the procedure well with no immediate complications.

## 2017-03-02 NOTE — [facility] Notes (Signed)
Pt received to room 4e19. CHG completed. Telemetry applied. VSS. Pt alert only to self. Pt agitated and refusing to keep left arm still, taking off sling.Trying to pull out foley and take off telemetry. Paged MD. Orders to give haldol. Will continue to monitor.   Clyde Canterbury, RN

## 2017-03-02 NOTE — Transfer of Care (Signed)
Immediate Anesthesia Transfer of Care Note  [name]  Procedure(s) Performed: ICD LEAD REMOVAL (N/A Chest) LEAD INSERTION (N/A )  Patient Location: PACU  Anesthesia Type:General  Level of Consciousness: awake, alert  and patient cooperative  Airway & Oxygen Therapy: Patient Spontanous Breathing  Post-op Assessment: Report given to RN and Post -op Vital signs reviewed and stable  Post vital signs: Reviewed and stable  Last Vitals:  Vitals:   03/02/17 0826  BP: (!) 166/85  Pulse: (!) 109  Resp: 18  Temp: 36.4 C  SpO2: 98%    Last Pain:  Vitals:   03/02/17 0826  TempSrc: Oral      Patients Stated Pain Goal: 4 (47/18/55 0158)  Complications: No apparent anesthesia complications

## 2017-03-02 NOTE — Anesthesia Procedure Notes (Addendum)
Procedure Name: Intubation Date/Time: 03/02/2017 1:49 PM Performed by: White, Amedeo Plenty, CRNA Pre-anesthesia Checklist: Patient identified, Emergency Drugs available, Suction available and Patient being monitored Patient Re-evaluated:Patient Re-evaluated prior to induction Oxygen Delivery Method: Circle System Utilized Preoxygenation: Pre-oxygenation with 100% oxygen Induction Type: IV induction Ventilation: Mask ventilation without difficulty Laryngoscope Size: Mac and 4 Grade View: Grade I Tube type: Oral Tube size: 7.5 mm Number of attempts: 1 Airway Equipment and Method: Stylet and Oral airway Placement Confirmation: ETT inserted through vocal cords under direct vision,  positive ETCO2 and breath sounds checked- equal and bilateral Secured at: 24 cm Tube secured with: Tape Dental Injury: Teeth and Oropharynx as per pre-operative assessment

## 2017-03-02 NOTE — Progress Notes (Signed)
  Echocardiogram Echocardiogram Transesophageal has been performed.  August Longest G Tammra Pressman 03/02/2017, 2:53 PM

## 2017-03-02 NOTE — Anesthesia Postprocedure Evaluation (Signed)
Anesthesia Post Note  [name]  Procedure(s) Performed: ICD LEAD REMOVAL (N/A Chest) LEAD INSERTION (N/A )     Patient location during evaluation: PACU Anesthesia Type: General Level of consciousness: sedated and patient cooperative Pain management: pain level controlled Vital Signs Assessment: post-procedure vital signs reviewed and stable Respiratory status: spontaneous breathing Cardiovascular status: stable Anesthetic complications: no    Last Vitals:  Vitals:   03/02/17 1630 03/02/17 1655  BP: (!) 144/96 138/88  Pulse: 82 (!) 28  Resp: 13 12  Temp: (!) 36.4 C (!) 36.4 C  SpO2: 97% 96%    Last Pain:  Vitals:   03/02/17 1655  TempSrc: Oral  PainSc:                  [name]

## 2017-03-02 NOTE — Anesthesia Preprocedure Evaluation (Signed)
Anesthesia Evaluation  Patient identified by MRN, date of birth, ID band Patient awake    Reviewed: Allergy & Precautions, H&P , NPO status , Patient's Chart, lab work & pertinent test results, reviewed documented beta blocker date and time   History of Anesthesia Complications Negative for: history of anesthetic complications  Airway Mallampati: II  TM Distance: >3 FB Neck ROM: Full    Dental  (+) Edentulous Upper, Edentulous Lower   Pulmonary shortness of breath, asthma , pneumonia, former smoker,    breath sounds clear to auscultation       Cardiovascular hypertension, Pt. on medications and Pt. 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) and +CHF  + dysrhythmias Atrial Fibrillation and Ventricular Tachycardia + pacemaker + Cardiac Defibrillator (for VT)  Rhythm:Regular Rate:Normal  '13 EF 35-40%   Neuro/Psych H/o SDH '13    GI/Hepatic Neg liver ROS, PUD, GERD  ,  Endo/Other  diabetes  Renal/GU Renal InsufficiencyRenal disease (creat 1.50)     Musculoskeletal  (+) Arthritis ,   Abdominal   Peds  Hematology  (+) anemia ,   Anesthesia Other Findings   Reproductive/Obstetrics                             Anesthesia Physical  Anesthesia Plan  ASA: IV  Anesthesia Plan: General   Post-op Pain Management:    Induction: Intravenous  PONV Risk Score and Plan: 2 and Ondansetron  Airway Management Planned: Oral ETT  Additional Equipment: Arterial line and TEE  Intra-op Plan:   Post-operative Plan: Extubation in OR  Informed Consent: I have reviewed the patients History and Physical, chart, labs and discussed the procedure including the risks, benefits and alternatives for the proposed anesthesia with the patient or authorized representative who has indicated his/her understanding and acceptance.   Dental advisory given  Plan  Discussed with: CRNA  Anesthesia Plan Comments: (Plan routine monitors, IV regional Lidocaine Cards recommends magnet deactivation of AICD intra-op )        Anesthesia Quick Evaluation

## 2017-03-02 NOTE — H&P (Signed)
HPI Mr. Cerney returns today for followup of his broken ICD lead. He is a pleasant 84 yo man with chronic systolic heart failure, VT remotely, s/p ICD insertion. He developed a broken ICD lead and was admitted. The decision was made initially to place a new lead but then it was decided to have him come back to see me to discuss ICD lead extraction and insertion of a new ICD lead. He has not had an ICD shock. He denies chest pain or sob but notes that his device has been alerting him several times a day.       Allergies  Allergen Reactions  . Celebrex [Celecoxib] Hives and Other (See Comments)    Gi upset  . Esomeprazole Magnesium Hives and Other (See Comments)    Other reaction(s): Hives "don't really remember"  . Tamsulosin Other (See Comments)    Dizziness, Made BP very low and weakness  . Dronedarone Other (See Comments)    Other reaction(s): GI upset, abdominal pain  . Digoxin Other (See Comments)    Unknown   . Hydrocodone-Acetaminophen Other (See Comments)    Bad headache  . Protonix [Pantoprazole Sodium] Nausea And Vomiting    Tolerates Dexilant           Current Outpatient Prescriptions  Medication Sig Dispense Refill  . ADVAIR DISKUS 250-50 MCG/DOSE AEPB Inhale 1 puff into the lungs 2 (two) times daily.   4  . calcium carbonate (TUMS - DOSED IN MG ELEMENTAL CALCIUM) 500 MG chewable tablet Chew 3 tablets by mouth daily as needed for heartburn.     . carvedilol (COREG) 12.5 MG tablet TAKE 1 TABLET (12.5 MG TOTAL) BY MOUTH 2 (TWO) TIMES DAILY. 180 tablet 3  . DEXILANT 60 MG capsule Take 1 capsule by mouth daily.  3  . diclofenac sodium (VOLTAREN) 1 % GEL Apply 2 g topically 4 (four) times daily. As needed 100 g 11  . fluticasone (FLONASE) 50 MCG/ACT nasal spray Place 2 sprays into the nose daily as needed for allergies.     . furosemide (LASIX) 40 MG tablet TAKE 1 TABLET (40 MG TOTAL) BY MOUTH DAILY. MAY TAKE AN EXTRA TAB DAILY AS NEEDED  FOR SWELLING 100 tablet 2  . gabapentin (NEURONTIN) 100 MG capsule TAKE 1 CAPSULE (100MG ) TO 3 CAPSULES (300MG ) EACH NIGHT AS NEEDED FOR MUSCLE PAINS 90 capsule 5  . LINZESS 72 MCG capsule Take 72 mcg by mouth daily.    Marland Kitchen lisinopril (PRINIVIL,ZESTRIL) 10 MG tablet Take 10 mg by mouth daily.    Marland Kitchen loratadine (CLARITIN) 10 MG tablet Take 10 mg by mouth daily.     . nitroGLYCERIN (NITROSTAT) 0.4 MG SL tablet PLACE ONE UNDER TONGUE FOR CHEST PAIN. 25 tablet 11  . polyethylene glycol (MIRALAX / GLYCOLAX) packet Take 17 g by mouth daily. 14 each 0  . potassium chloride (K-DUR,KLOR-CON) 10 MEQ tablet Take 1 tablet (10 mEq total) by mouth daily. 30 tablet 6  . ranitidine (ZANTAC) 300 MG tablet Take 1 tablet (300 mg total) by mouth daily. 90 tablet 3  . RESTASIS 0.05 % ophthalmic emulsion INSTILL 1 DROP INTO BOTH EYES TWICE A DAY 180 mL 3  . traMADol (ULTRAM) 50 MG tablet TAKE 2 TABLETS BY MOUTH EVERY 8 HOURS AS NEEDED 540 tablet 1  . VENTOLIN HFA 108 (90 Base) MCG/ACT inhaler Inhale 2 puffs into the lungs 4 (four) times daily as needed.  9   No current facility-administered medications for  this visit.          Past Medical History:  Diagnosis Date  . Abnormal thyroid scan    Abnormal thyroid imaging studies from 11/09/2010, status post ultrasound guided fine needle aspiration of the dominant left inferior thyroid nodule on 12/15/2010. Cytology report showed rare follicular epithelial cells and hemosiderin laden macrophages.  . Arthritis    "all over"  . Asthma   . Atrial fibrillation ([place])    on chronic Coumadin; stopped July 2013 due to subdural hematomas  . CHF (congestive heart failure) (HCC)    EF 35-40% s/p most recent ICD generator change-out with Medtronic dual-chamber ICD 05/20/11 with explantation of previous abdominally-implanted device  . Coronary artery disease    s/p CABG 1983 and PCI/stent 2004.   . Diabetes mellitus    diet controlled  . Dyslipidemia     . Erythrocytosis   . GERD (gastroesophageal reflux disease)   . Hypertension   . Ischemic cardiomyopathy    WITH CHF  . Monocytosis 04/17/2013  . Myocardial infarction ([place]) 1983; ~ 1990  . 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 Pneumonia August 2013  . Subdural hematoma [facility]) July 2013   Anticoagulation stopped.   . VT (ventricular tachycardia) ([place])   . Wears glasses     ROS:   All systems reviewed and negative except as noted in the HPI.        Past Surgical History:  Procedure Laterality Date  . CHOLECYSTECTOMY    . COLONOSCOPY  07/07/2011   Procedure: COLONOSCOPY;  Surgeon: Jerene Bears, MD;  Location: [facility];  Service: Gastroenterology;  Laterality: N/A;  . CORONARY ANGIOPLASTY WITH STENT PLACEMENT  2004   Tandem Cypher stents LAD  . CORONARY ARTERY BYPASS GRAFT  1983   SVG-mLAD  . ESOPHAGOGASTRODUODENOSCOPY  02/11/2011   Procedure: ESOPHAGOGASTRODUODENOSCOPY (EGD);  Surgeon: Beryle Beams, MD;  Location: [facility];  Service: Endoscopy;  Laterality: N/A;  . ICD GENERATOR CHANGEOUT N/A 04/08/2016   Procedure: ICD Generator Changeout;  Surgeon: Evans Lance, MD;  Location: [facility];  Service: Cardiovascular;  Laterality: N/A;  . IMPLANTABLE CARDIOVERTER DEFIBRILLATOR (ICD) GENERATOR CHANGE N/A 05/20/2011   Procedure: ICD GENERATOR CHANGE;  Surgeon: Evans Lance, MD;  Medtronic secure dual-chamber ICD serial number [id number]   . KNEE ARTHROSCOPY     right; "just went in and scraped it"  . MASS EXCISION Right 05/10/2013   Procedure: EXCISION MASS RIGHT THUMB;  Surgeon: Wynonia Sours, MD;  Location: [place];  Service: Orthopedics;  Laterality: Right;  . PROXIMAL INTERPHALANGEAL FUSION (PIP) Right 05/10/2013   Procedure: DEBRIDEMENT PROXIMAL INTERPHALANGEAL FUSION (PIP);  Surgeon: Wynonia Sours, MD;  Location: [place];  Service: Orthopedics;  Laterality: Right;  . TONSILLECTOMY                Family  History  Problem Relation Age of Onset  . Tuberculosis Mother   . Tuberculosis Father   . Heart disease Brother   . Diabetes Sister   . Diabetes Brother   . Clotting disorder Brother      Social History        Social History  . Marital status: Divorced    Spouse name: N/A  . Number of children: 2  . Years of education: N/A       Occupational History  . real estate Retired        Social History Main Topics  . Smoking status: Former Smoker  Packs/day: 2.00    Years: 30.00    Types: Cigarettes    Quit date: 06/23/1976  . Smokeless tobacco: Never Used  . Alcohol use No  . Drug use: No  . Sexual activity: No       Other Topics Concern  . Not on file      Social History Narrative  . No narrative on file     BP 140/80   Pulse 80   Ht 6' (1.829 m)   Wt 196 lb (88.9 kg)   BMI 26.58 kg/m   Physical Exam:  Well appearing 83 NAD HEENT: Unremarkable Neck:  No JVD, no thyromegally Lymphatics:  No adenopathy Back:  No CVA tenderness Lungs:  Clear with no wheezes HEART:  Regular rate rhythm, no murmurs, no rubs, no clicks Abd:  soft, positive bowel sounds, no organomegally, no rebound, no guarding Ext:  2 plus pulses, no edema, no cyanosis, no clubbing Skin:  No rashes no nodules Neuro:  CN II through XII intact, motor grossly intact    DEVICE  Normal device function.  See PaceArt for details. Broken ICD lead with noise resulting in oversensing  Assess/Plan: 1. ICD lead dysfunction - I have discussed the treatment options with the patient. He would like to undergo removal of his old and broken lead and insertion of a new ICD lead. I have carefully discussed the risks/benefits/goals/expectations of the procedure with the patient and he wishes to proceed.  2. Chronic systolic heart failure - His symptoms are class 2. He will continue his current meds. 3. HTN - his blood pressure is minimally elevated. He will maintain a low  sodium diet. 4. VT - he has a h/o sustained VT but has not had any in over 10 years by his report.  Ponciano Ort.  EP Attending  Patient seen and examined. Agree with above. The patient developed diarrhea several months ago and his diarrhea has finally improved. I have reviewed the findings again as noted above. He wishes to proceed with extraction of his broken ICD lead and insertion of a new ICD lead. The risks/benefits/goals/expectations of the procedure were reviewed and we will proceed.   Mikle Bosworth.D.

## 2017-03-02 NOTE — Progress Notes (Signed)
CT surgery  I  Was present in the OR and provided surgical backup[cardiac surgical backup] to Dr Lovena Le for  AICD lead extraction on R [place] - 45 minutes

## 2017-03-02 NOTE — Progress Notes (Signed)
Patient stated he had a "small place on the left foot" that he would like someone to look at. Found a small, circular red area to left heel with bandaid in place. When placing sacral foam on his sacrum, found that his right buttock was pink as well. Both places were blanchable.

## 2017-03-03 ENCOUNTER — Inpatient Hospital Stay (HOSPITAL_COMMUNITY): Payer: Medicare Other

## 2017-03-03 ENCOUNTER — Other Ambulatory Visit: Payer: Self-pay | Admitting: *Deleted

## 2017-03-03 ENCOUNTER — Encounter: Payer: Self-pay | Admitting: *Deleted

## 2017-03-03 ENCOUNTER — Encounter (HOSPITAL_COMMUNITY): Payer: Self-pay | Admitting: Internal Medicine

## 2017-03-03 DIAGNOSIS — I255 Ischemic cardiomyopathy: Secondary | ICD-10-CM | POA: Diagnosis not present

## 2017-03-03 DIAGNOSIS — J45909 Unspecified asthma, uncomplicated: Secondary | ICD-10-CM | POA: Diagnosis not present

## 2017-03-03 DIAGNOSIS — E119 Type 2 diabetes mellitus without complications: Secondary | ICD-10-CM

## 2017-03-03 DIAGNOSIS — I4891 Unspecified atrial fibrillation: Secondary | ICD-10-CM

## 2017-03-03 DIAGNOSIS — I5022 Chronic systolic (congestive) heart failure: Secondary | ICD-10-CM | POA: Diagnosis not present

## 2017-03-03 DIAGNOSIS — I110 Hypertensive heart disease with heart failure: Secondary | ICD-10-CM | POA: Diagnosis not present

## 2017-03-03 DIAGNOSIS — I2510 Atherosclerotic heart disease of native coronary artery without angina pectoris: Secondary | ICD-10-CM | POA: Diagnosis not present

## 2017-03-03 DIAGNOSIS — K625 Hemorrhage of anus and rectum: Secondary | ICD-10-CM | POA: Diagnosis not present

## 2017-03-03 DIAGNOSIS — K219 Gastro-esophageal reflux disease without esophagitis: Secondary | ICD-10-CM | POA: Diagnosis not present

## 2017-03-03 DIAGNOSIS — E785 Hyperlipidemia, unspecified: Secondary | ICD-10-CM

## 2017-03-03 DIAGNOSIS — M1990 Unspecified osteoarthritis, unspecified site: Secondary | ICD-10-CM | POA: Diagnosis not present

## 2017-03-03 DIAGNOSIS — Z87440 Personal history of urinary (tract) infections: Secondary | ICD-10-CM

## 2017-03-03 NOTE — Patient Outreach (Signed)
[facility]) Care Management  03/03/2017  [name] 06-Nov-1932 [id number]    Case Closure  Case will be closed based upon unsuccessful calls and no response to the outreach letter sent to pt on 1/17. Provided will be notified and CMA.   Raina Mina, RN Care Management Coordinator [place] Office [phone]

## 2017-03-03 NOTE — Care Management Note (Signed)
Case Management Note Marvetta Gibbons RN, BSN Unit 4E-Case Manager [phone]  Patient Details  Name: CHARLEE WHITEBREAD MRN: [id number] Date of Birth: [birth year]  Subjective/Objective:  Pt admitted with ICD failed lead with replacement of lead                   Action/Plan: PTA pt lived at home alone, independent (still drives)- pt was active with [facility] and also had HHPT with [facility]- post procedure confusion- per PT eval recommendation for SNF- pt's son lives in [place] consulted for possible placement- if pt improves and can return home will need resumption orders for [facility] services- CM to follow for transition of care needs.   Expected Discharge Date:                  Expected Discharge Plan:  Skilled Nursing Facility  In-House Referral:  Clinical Social Work  Discharge planning Services  CM Consult  Post Acute Care Choice:  Home Health, Resumption of Svcs/PTA Provider Choice offered to:     DME Arranged:    DME Agency:     HH Arranged:    [place] Agency:  [facility] [place]  Status of Service:  In process, will continue to follow  If discussed at Long Length of Stay Meetings, dates discussed:    Discharge Disposition:   Additional Comments:  Dawayne Patricia, RN 03/03/2017, 4:13 PM

## 2017-03-03 NOTE — Progress Notes (Signed)
Pt refused xray and to wear sling. Pt educated but continue to refuse. Will continue to monitor. [name]

## 2017-03-03 NOTE — Evaluation (Signed)
Physical Therapy Evaluation Patient Details Name: [name] MRN: [id number] DOB: 25-Dec-1932 Today's Date: 03/03/2017   History of Present Illness  Pt is an 84 y/o male admitted secondary to ICD failure. Pt is s/p ICD lead removal with insertion of new leads on 1/31. PMH includes a fib, CHF, ischemic cardiomyopathy, HTN, CAD, and MI s/p CABG.   Clinical Impression  Pt s/p procedure above with deficits below. Pt presenting with increased confusion, unsteadiness, and weakness. Required mod A for basic mobility this session. Pt also required education about  precautions with LUE multiple times throughout session, as pt could not remember precautions. Pt at a very high risk for falls. Per pt he lives alone; unsure of assist able to be provided at home. Feel pt will need post acute rehab at d/c prior to admission. Will continue to follow acutely to maximize functional mobility independence and safety.     Follow Up Recommendations SNF;Supervision/Assistance - 24 hour    Equipment Recommendations  3in1 (PT)    Recommendations for Other Services       Precautions / Restrictions Precautions Precautions: Fall;ICD/Pacemaker Required Braces or Orthoses: Sling(LUE ) Restrictions Weight Bearing Restrictions: Yes LUE Weight Bearing: Non weight bearing Other Position/Activity Restrictions: Pacemaker precautions       Mobility  Bed Mobility Overal bed mobility: Needs Assistance Bed Mobility: Sit to Supine;Supine to Sit     Supine to sit: Mod assist Sit to supine: Mod assist   General bed mobility comments: Mod A for trunk elevation and to assist with scooting hips to EOB. Required education about why he could not use his LUE. Mod A for LE lift assist to return to supine.   Transfers Overall transfer level: Needs assistance Equipment used: 1 person hand held assist Transfers: Sit to/from [facility] Sit to Stand: Mod assist Stand pivot transfers: Mod assist        General transfer comment: Mod A to for lift assist and steadying. Mod A for steadying throughout stand pivot to [facility] and back to bed. Increased difficulty sequencing and required verbal and tactile cues for sequencing during transfer.   Ambulation/Gait             General Gait Details: Unsafe to attempt.   Stairs            Wheelchair Mobility    Modified Rankin (Stroke Patients Only)       Balance Overall balance assessment: Needs assistance Sitting-balance support: No upper extremity supported;Feet supported Sitting balance-Leahy Scale: Good     Standing balance support: Single extremity supported;During functional activity Standing balance-Leahy Scale: Poor Standing balance comment: Reliant on RUE support and external support for steadying.                              Pertinent Vitals/Pain Pain Assessment: Faces Faces Pain Scale: Hurts a little bit Pain Location: L shouder  Pain Descriptors / Indicators: Discomfort;Grimacing Pain Intervention(s): Limited activity within patient's tolerance;Monitored during session;Repositioned    Home Living Family/patient expects to be discharged to:: Private residence Living Arrangements: Alone   Type of Home: House Home Access: Level entry     Home Layout: One level Home Equipment: Environmental consultant - 2 wheels;Walker - 4 wheels;Cane - single point Additional Comments: Pt confused, so will need to ensure accuracy of information with family.     Prior Function Level of Independence: Independent with assistive device(s)  Comments: Reports he uses cane and rollator.      Hand Dominance        Extremity/Trunk Assessment   Upper Extremity Assessment Upper Extremity Assessment: LUE deficits/detail LUE Deficits / Details: LUE in sling secondary to pacemaker precautions.     Lower Extremity Assessment Lower Extremity Assessment: Generalized weakness    Cervical / Trunk Assessment Cervical / Trunk  Assessment: Kyphotic  Communication   Communication: No difficulties  Cognition Arousal/Alertness: Awake/alert Behavior During Therapy: WFL for tasks assessed/performed Overall Cognitive Status: Impaired/Different from baseline Area of Impairment: Orientation;Attention;Memory;Following commands;Safety/judgement;Awareness;Problem solving                 Orientation Level: Disoriented to;Time;Situation Current Attention Level: Sustained Memory: Decreased recall of precautions;Decreased short-term memory Following Commands: Follows one step commands with increased time Safety/Judgement: Decreased awareness of safety;Decreased awareness of deficits Awareness: Emergent Problem Solving: Slow processing;Decreased initiation;Difficulty sequencing;Requires verbal cues;Requires tactile cues General Comments: Pt had to be reminded multiple times of precautions with LUE. Asking to come out of sling, but had to remind pt of his precautions. Could not remember why he was here in the hospital and states it is 1991. Reoriented to time and situation. Pt asking for urinal as well, even though he had catheter. Educated that pt had catheter, however, pt insisted on using urinal.       General Comments      Exercises     Assessment/Plan    PT Assessment Patient needs continued PT services  PT Problem List Decreased strength;Decreased balance;Decreased mobility;Decreased coordination;Decreased safety awareness;Decreased knowledge of precautions;Decreased knowledge of use of DME;Decreased cognition       PT Treatment Interventions DME instruction;Gait training;Stair training;Functional mobility training;Therapeutic activities;Therapeutic exercise;Balance training;Patient/family education;Cognitive remediation;Neuromuscular re-education    PT Goals (Current goals can be found in the Care Plan section)  Acute Rehab PT Goals PT Goal Formulation: Patient unable to participate in goal setting Time For  Goal Achievement: 03/17/17 Potential to Achieve Goals: Fair    Frequency Min 2X/week   Barriers to discharge Decreased caregiver support Lives alone     Co-evaluation               AM-PAC PT "6 Clicks" Daily Activity  Outcome Measure Difficulty turning over in bed (including adjusting bedclothes, sheets and blankets)?: Unable Difficulty moving from lying on back to sitting on the side of the bed? : Unable Difficulty sitting down on and standing up from a chair with arms (e.g., wheelchair, bedside commode, etc,.)?: Unable Help needed moving to and from a bed to chair (including a wheelchair)?: A Lot Help needed walking in hospital room?: A Lot Help needed climbing 3-5 steps with a railing? : Total 6 Click Score: 8    End of Session Equipment Utilized During Treatment: Gait belt;Other (comment)(sling ) Activity Tolerance: Patient tolerated treatment well Patient left: in bed;with call bell/phone within reach;with nursing/sitter in room Nurse Communication: Mobility status PT Visit Diagnosis: Unsteadiness on feet (R26.81);Muscle weakness (generalized) (M62.81)    Time: 2703-5009 PT Time Calculation (min) (ACUTE ONLY): 26 min   Charges:   PT Evaluation $PT Eval Moderate Complexity: 1 Mod PT Treatments $Therapeutic Activity: 8-22 mins   PT G Codes:        Leighton Ruff, PT, DPT  Acute Rehabilitation Services  Pager: [phone]   Rudean Hitt 03/03/2017, 11:27 AM

## 2017-03-03 NOTE — Progress Notes (Signed)
Progress Note  Patient Name: [name] Date of Encounter: 03/03/2017  Primary Cardiologist: Dr Lovena Le   Subjective   Pt denies CP or SOB. He is confused, had earlier refused CXR, he now agrees. Seems very weak, has not been OOB since device inserted.  Inpatient Medications    Scheduled Meds: . digoxin  0.125 mg Oral Daily  . metoprolol succinate  25 mg Oral Daily  . mometasone-formoterol  2 puff Inhalation BID  . potassium chloride  10 mEq Oral Daily   Continuous Infusions:  PRN Meds: acetaminophen, calcium carbonate, linaclotide, loratadine, ondansetron (ZOFRAN) IV, polyethylene glycol   Vital Signs    Vitals:   03/02/17 2300 03/03/17 0315 03/03/17 0759 03/03/17 0838  BP: (!) 101/59 (!) 123/57  126/68  Pulse: 82 80  84  Resp: 17 18    Temp: 97.6 F (36.4 C) 97.8 F (36.6 C)    TempSrc: Oral Oral    SpO2: 96%  97%   Weight:      Height:        Intake/Output Summary (Last 24 hours) at 03/03/2017 4034 Last data filed at 03/03/2017 0700 Gross per 24 hour  Intake 1500 ml  Output 1395 ml  Net 105 ml   Filed Weights   03/02/17 0831  Weight: 180 lb 8 oz (81.9 kg)    Telemetry    Atrial fib, HR generally <100, had some bigeminy earlier. - Personally Reviewed  ECG    None today - Personally Reviewed  Physical Exam   General: Well developed, well nourished, male appearing in no acute distress. Head: Normocephalic, atraumatic.  Neck: Supple without bruits, JVD not elevated. Lungs:  Resp regular and unlabored, decreased BS bases. Small hematoma axillary area, mod ecchymosis around site, pressure dressing left in place. Heart: Irreg R&R, S1, S2, no S3, S4, or murmur; no rub. Abdomen: Soft, non-tender, non-distended with normoactive bowel sounds. No hepatomegaly. No rebound/guarding. No obvious abdominal masses. Extremities: No clubbing, cyanosis, no edema. Distal pedal pulses are 2+ bilaterally. Neuro: Alert and oriented X 3. Moves all extremities  spontaneously. Psych: Normal affect.  Labs    Hematology Recent Labs  Lab 02/24/17 1054 02/27/17 1321  WBC 9.8 9.7  RBC 4.99 5.24  HGB 15.2 15.9  HCT 44.1 47.4  MCV 88 90.5  MCH 30.5 30.3  MCHC 34.5 33.5  RDW 15.2 15.8*  PLT 171 167    Chemistry Recent Labs  Lab 02/24/17 1054 02/27/17 1321  NA 137 137  K 5.2 4.6  CL 95* 100*  CO2 24 23  GLUCOSE 127* 144*  BUN 20 15  CREATININE 1.38* 1.39*  CALCIUM 9.6 9.8  GFRNONAA 47* 46*  GFRAA 55* 53*  ANIONGAP  --  14    Lab Results  Component Value Date   DIGOXIN <0.3 (L) 11/27/2010     Radiology    No results found.   Cardiac Studies   Procedure performed: Extraction of a previously implanted atrial and defibrillation lead secondary to nonfunction of the defibrillation lead in the setting of an occluded left subclavian vein, and insertion of a new active-fixation defibrillation lead.  Preoperative diagnosis: Broken ICD lead in the setting of an ischemic cardiomyopathy and chronic systolic heart failure, and ventricular tachycardia  Postoperative diagnosis: Same as preoperative diagnosis  Description of the procedure: After informed consent was obtained, the patient was taken to the operating room in the fasting state.  The anesthesia service was utilized to provide general endotracheal anesthesia.  Invasive arterial  monitoring was also provided by the anesthesia service with a catheter in the left radial artery.  After the usual timeout, a 6 French sheath was placed percutaneously in the right femoral vein.  Attention was then turned to the ICD pocket.  The left subclavian vein was initially punctured, but the Glidewire could not be advanced through the occluded left subclavian vein.  At this point attention was turned to removal of the old atrial and defibrillation lead.  20 cc of lidocaine was infiltrated into the left pectoral region.  A 7 cm incision was made and electrocautery utilized to dissect down to the  pectoralis major muscle.  The fibrous scar tissue in the muscle was reexcised and the area underneath the pectoralis major was opened and the old Medtronic single-chamber ICD was removed with gentle traction.  The ICD was disconnected from the defibrillator lead.  The dense fibrous scar tissue was removed with electrocautery and the old atrial and defibrillator lead were freed up from the dense fibrous scar tissue.  The atrial lead had been capped because of the patient's atrial fibrillation.  The stylette was inserted into the lead and the helix retracted.  The lead was cut.  A Cook liberator locking stylette was inserted into the lead and advanced to the right atrial lead tip.  The [place] 1 tie proximal suture was placed at the proximal portion of the lead.  The [facility] 11 [place] short RL dissection sheath was advanced over the lead and gentle traction placed on the lead, and it was removed in total.  At this point attention was turned to removal of the defibrillator lead.  A stylet was first inserted into the lead and attempts to retract the lead helix were unsuccessful.  The lead was cut.  The [facility] liberator locking stylette was inserted into the body of the lead advanced to the tip of the lead in the right ventricle.  The proximal portion of the lead was also secured with a Cook 1 tie suture.  At this point, the [facility] short RL dissection sheath was advanced over the defibrillator lead all the way to the junction of the innominate vein.  The short Cook dissection sheath was removed and the Cook RL dissection sheath was advanced over the liberator locking stylette and lead body and into the left subclavian vein.  Traction and countertraction as well as pressure and counterpressure were applied to the body of the lead.  There is dense fibrous scar tissue at the subclavian and innominate vein junction as well as along the innominate vein and superior vena cava junction particularly at the distal portion of the proximal  shocking coil.  With gentle traction, the distal portion of the lead was pulled free of the right ventricle.  There was dense scar tissue still present in the outer sheath was advanced over the proximal and distal coils until the lead was removed in total.  The outer sheath was maintained in the superior vena cava.  At this point the Glidewire was inserted through the 11 French outer sheath and the outer sheath was removed.  Pressure was held and then a 9 [place] long peel-away sheath was inserted over the Glidewire into the central circulation.  The Medtronic active fixation defibrillation lead, serial [id number] V, was advanced through the 9 French sheath and into the right ventricle.  Along the right ventricular apical septum, R waves measured 14 mV, the lead was actively fixed and the pacing impedance was 6 7 4  ohms.  The  pacing threshold 0.5 V at 0.5 ms.  The lead was secured to the fascia with silk suture.  The sewing sleeve was also secured with silk suture.  At this point, the old Medtronic defibrillator which had adequate battery longevity was reconnected to the new defibrillator lead and placed in the old subpectoral pocket.  The pocket was irrigated extensively with antibiotic irrigation.  The incision was closed with 3 layers of Vicryl suture.  Benzoin and Steri-Strips were painted on the skin and the patient returned to the recovery area in stable condition.  The old right femoral venous sheath was removed and pressure held and hemostasis assured.  Complications: There were no immediate procedure complications  Conclusion: Successful extraction of a previously implanted atrial as well as active-fixation defibrillation lead with insertion of a new defibrillation lead using the access to the superior vena cava by way of the extraction sheath.  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  Cristopher Peru, MD   Patient Profile     84 y.o. male w/ hx chronic systolic heart failure, VT  remotely, s/p ICD insertion. He developed a broken lead. The decision was made to have ICD lead extraction and insertion of a new ICD lead. He has not had an ICD shock.  He was admitted for this 03/02/2017  Assessment & Plan    Principal Problem: 1.  Failure of implantable cardioverter-defibrillator (ICD) lead  - s/p Successful extraction of a previously implanted atrial as well as active-fixation defibrillation lead with insertion of a new defibrillation lead, connected to his MDT ICD - pt now agrees to CXR, f/u on results  Active Problems: 2.  ventricular tachycardia - none on telemetry  3.  Chronic atrial fibrillation (HCC) - rate generally ok - continue dig, check level  4.  Chronic anticoagulation discontinued July 2013 after developed subdural hematoma - SCDs while here  5. MS issues - pt was disoriented and confused last pm, trying to hit staff - ?sundowning - however, he appears very weak, lives alone in an apartment in [place] - will ask PT to see.    Jonetta Speak , PA-C 9:29 AM 03/03/2017 Pager: [phone]  EP Attending  Patient seen and examined. Agree with above. He is s/p lead extraction. Last night after he went to his room he had a little confusion and this morning he is a little weak. He is pending PT eval. My bias would be to allow him to go home. He may be a little weak for a day or two from his anesthesia but I expect he will be back to his baseline in 24-36 hours.  Mikle Bosworth.D.

## 2017-03-03 NOTE — Consult Note (Signed)
   [facility] CM Inpatient Consult   03/03/2017  [name] 1932-08-07 [id number]    [name] recently signed up with [facility] services during last hospitalization at [facility]. It appears [facility] RNCM was able to make brief contact with him post discharge but was unable to reach [name] any further. Please see chart review tab then encounters for those patient outreach details.   Spoke with inpatient RNCM to make aware that [name] with [facility]. Discussed that [name] does live alone and has very little support. His son lives in [place]. Made aware that PT is pending. He has had some confusion as well.  Will continue to follow along and update [place] team as needed.    Joshua Rolling, MSN-Ed, RN,BSN [facility] Liaison [phone]

## 2017-03-04 ENCOUNTER — Encounter (HOSPITAL_COMMUNITY): Payer: Self-pay | Admitting: Cardiology

## 2017-03-04 DIAGNOSIS — R410 Disorientation, unspecified: Secondary | ICD-10-CM

## 2017-03-04 MED ORDER — TRAMADOL HCL 50 MG PO TABS: ORAL_TABLET | ORAL | 0 refills | Status: DC

## 2017-03-04 MED ORDER — FUROSEMIDE 40 MG PO TABS
40.0000 mg | ORAL_TABLET | Freq: Every day | ORAL | Status: DC
  Administered 2017-03-04 – 2017-03-05 (×2): 40 mg via ORAL
  Filled 2017-03-04 (×2): qty 1

## 2017-03-04 NOTE — NC FL2 (Signed)
[place] LEVEL OF CARE SCREENING TOOL     IDENTIFICATION  Patient Name: [name] Birthdate: 1932-06-26 Sex: male Admission Date (Current Location): 03/02/2017  [facility] and [place] Number:  Herbalist and Address:  [facility], [place] [address] [place],  [address]      Provider Number: [id number]  Attending Physician Name and Address:  Evans Lance, MD  Relative Name and Phone Number:  Aran, son, [phone]    Current Level of Care: Hospital Recommended Level of Care: [place] Prior Approval Number:    Date Approved/Denied:   PASRR Number: [id number]  Discharge Plan: SNF    Current Diagnoses: Patient Active Problem List   Diagnosis Date Noted  . Failure of implantable cardioverter-defibrillator (ICD) lead 03/02/2017  . Rectal ulcer   . Rectal bleeding   . Proctitis 02/07/2017  . Abdominal pain 02/06/2017  . Acute radiation proctitis 02/06/2017  . Failure of implantable cardioverter-defibrillator lead 11/10/2016  . ICD (implantable cardioverter-defibrillator) in place 04/08/2016  . Gastroesophageal reflux disease without esophagitis 12/31/2013  . Hypotension 09/23/2011  . HCAP (healthcare-associated pneumonia) 09/23/2011  . SIRS (systemic inflammatory response syndrome) ([place]) 09/23/2011  . Thrombocytopenia ([place]) 09/23/2011  . History of subdural hemorrhage 09/08/2011  . Ataxia 08/17/2011  . Dehydration 08/17/2011  . Atrial fibrillation with controlled ventricular response ([place]) 08/13/2011  . Colon polyp 07/07/2011  . Angiodysplasia of colon 07/07/2011  . GERD (gastroesophageal reflux disease) 05/31/2011  . Anemia, iron deficiency 12/29/2010  . Chronic anticoagulation discontinued July 2013 after developed subdural hematoma 09/22/2010  . Bradycardia 09/22/2010  . CAD (coronary artery disease) 06/25/2010  . ventricular tachycardia 12/30/2009  . Chronic atrial fibrillation ([place])  12/30/2009  . Chronic systolic heart failure- EF 35-40% 12/30/2009  . Automatic implantable cardioverter-defibrillator in situ 12/30/2009    Orientation RESPIRATION BLADDER Height & Weight     Self, Place  Normal Continent, Indwelling catheter Weight: 81.9 kg (180 lb 8 oz) Height:  6' (182.9 cm)  BEHAVIORAL SYMPTOMS/MOOD NEUROLOGICAL BOWEL NUTRITION STATUS      Continent Diet(Please see DC Summary)  AMBULATORY STATUS COMMUNICATION OF NEEDS Skin   Limited Assist Verbally Surgical wounds(Closed incision on chest;)                       Personal Care Assistance Level of Assistance  Bathing, Feeding, Dressing Bathing Assistance: Limited assistance Feeding assistance: Independent Dressing Assistance: Limited assistance     Functional Limitations Info  Sight Sight Info: Impaired        SPECIAL CARE FACTORS FREQUENCY  PT (By licensed PT)     PT Frequency: 5x/week              Contractures      Additional Factors Info  Code Status, Allergies, Isolation Code Status Info: Full Allergies Info: Tamsulosin, Celebrex Celecoxib, Dronedarone, Esomeprazole Magnesium, Digoxin, Hydrocodone-acetaminophen, Protonix Pantoprazole Sodium  Isolation: MRSA           Current Medications (03/04/2017):  This is the current hospital active medication list Current Facility-Administered Medications  Medication Dose Route Frequency Provider Last Rate Last Dose  . acetaminophen (TYLENOL) tablet 325-650 mg  325-650 mg Oral Q4H PRN Evans Lance, MD   650 mg at 03/03/17 0342  . calcium carbonate (TUMS - dosed in mg elemental calcium) chewable tablet 200 mg of elemental calcium  1 tablet Oral Daily PRN Evans Lance, MD      . digoxin Fonnie [place])  tablet 0.125 mg  0.125 mg Oral Daily Evans Lance, MD   0.125 mg at 03/03/17 5825  . linaclotide (LINZESS) capsule 72 mcg  72 mcg Oral Daily PRN Evans Lance, MD      . loratadine (CLARITIN) tablet 10 mg  10 mg Oral Daily PRN Evans Lance, MD      . metoprolol succinate (TOPROL-XL) 24 hr tablet 25 mg  25 mg Oral Daily Evans Lance, MD   25 mg at 03/03/17 0839  . mometasone-formoterol (DULERA) 200-5 MCG/ACT inhaler 2 puff  2 puff Inhalation BID Evans Lance, MD   2 puff at 03/04/17 [phone]  . ondansetron (ZOFRAN) injection 4 mg  4 mg Intravenous Q6H PRN Evans Lance, MD      . polyethylene glycol (MIRALAX / GLYCOLAX) packet 17 g  17 g Oral Daily PRN Evans Lance, MD      . potassium chloride (K-DUR,KLOR-CON) CR tablet 10 mEq  10 mEq Oral Daily Evans Lance, MD   10 mEq at 03/03/17 4210     Discharge Medications: Please see discharge summary for a list of discharge medications.  Relevant Imaging Results:  Relevant Lab Results:   Additional Information SSN: Donnelly [place], [place]

## 2017-03-04 NOTE — Clinical Social Work Note (Signed)
Clinical Social Work Assessment  Patient Details  Name: [name] MRN: [id number] Date of Birth: 02-17-1932  Date of referral:  03/04/17               Reason for consult:  Facility Placement                Permission sought to share information with:  Facility Sport and exercise psychologist, Family Supports Permission granted to share information::  Yes, Verbal Permission Granted  Name::        Agency::  SNFs  Relationship::     Contact Information:     Housing/Transportation Living arrangements for the past 2 months:  Apartment Source of Information:  Patient, Adult Children Patient Interpreter Needed:  None Criminal Activity/Legal Involvement Pertinent to Current Situation/Hospitalization:  No - Comment as needed Significant Relationships:  Friend Lives with:  Self Do you feel safe going back to the place where you live?  No Need for family participation in patient care:  Yes (Comment)  Care giving concerns:  CSW received consult for possible SNF placement at time of discharge. CSW spoke with patient's son regarding PT recommendation of SNF placement at time of discharge. He preferred for patient to make the decision. Patient reported that given patient's current physical needs and fall risk he would like rehab. Patient expressed understanding of PT recommendation and is agreeable to SNF placement at time of discharge. CSW to continue to follow and assist with discharge planning needs.   Social Worker assessment / plan:  CSW spoke with patient concerning possibility of rehab at [facility] before returning home.  Employment status:  Retired Forensic scientist:  Medicare PT Recommendations:  [place] / Referral to community resources:  [place]  Patient/Family's Response to care:  Patient recognizes need for rehab before returning home and is agreeable to a SNF in [place]. CSW provided offers.  Patient/Family's Understanding of and  Emotional Response to Diagnosis, Current Treatment, and Prognosis:  Patient/family is realistic regarding therapy needs and expressed being hopeful for SNF placement. Patient expressed understanding of CSW role and discharge process as well as medical condition. No questions/concerns about plan or treatment.    Emotional Assessment Appearance:  Appears stated age Attitude/Demeanor/Rapport:  Gracious Affect (typically observed):  Accepting, Appropriate, Quiet Orientation:  Oriented to Self, Oriented to Place Alcohol / Substance use:  Not Applicable Psych involvement (Current and /or in the community):  No (Comment)  Discharge Needs  Concerns to be addressed:  Care Coordination Readmission within the last 30 days:  Yes Current discharge risk:  None Barriers to Discharge:  No Barriers Identified   Benard Halsted, [place] 03/04/2017, 6:12 PM

## 2017-03-04 NOTE — Progress Notes (Signed)
Patient's son contacted CSW back. He stated that he would prefer for patient to make the decision of SNF choice. If patient is unable to do so, he can make the decision. CSW spoke with patient who appeared more oriented. CSW provided bed offers. Patient is unfamiliar with facilities but asked for the one that was rated the highest. CSW explained the CMS ratings and patient in Agreement with [facility]. [place] aware. Patient able to transfer tomorrow by PTAR. Patient will ask friends to bring him clothes at the facility.   Percell Locus Camdan Burdi LCSW [phone]

## 2017-03-04 NOTE — Progress Notes (Signed)
Progress Note  Patient Name: [name] Date of Encounter: 03/04/2017  Primary Cardiologist: Lovena Le  Subjective   No chest pain or sob.   Inpatient Medications    Scheduled Meds: . digoxin  0.125 mg Oral Daily  . metoprolol succinate  25 mg Oral Daily  . mometasone-formoterol  2 puff Inhalation BID  . potassium chloride  10 mEq Oral Daily   Continuous Infusions:  PRN Meds: acetaminophen, calcium carbonate, linaclotide, loratadine, ondansetron (ZOFRAN) IV, polyethylene glycol   Vital Signs    Vitals:   03/03/17 0838 03/03/17 1208 03/04/17 0349 03/04/17 0842  BP: 126/68 (!) 166/92 (!) 141/94   Pulse: 84  93   Resp:   (!) 24   Temp:  98.2 F (36.8 C) (!) 97.5 F (36.4 C)   TempSrc:  Oral Oral   SpO2:    93%  Weight:      Height:        Intake/Output Summary (Last 24 hours) at 03/04/2017 0851 Last data filed at 03/04/2017 4196 Gross per 24 hour  Intake 240 ml  Output 625 ml  Net -385 ml   Filed Weights   03/02/17 0831  Weight: 180 lb 8 oz (81.9 kg)    Telemetry    Atrial fib with a controlled VR - Personally Reviewed  ECG    none - Personally Reviewed  Physical Exam   GEN: No acute distress.   Neck: 6 cm JVD Cardiac: IRIRR, no murmurs, rubs, or gallops.  Respiratory: Clear to auscultation bilaterally. GI: Soft, nontender, non-distended  MS: No edema; No deformity. Neuro:  Nonfocal  Psych: Normal affect   Labs    Chemistry Recent Labs  Lab 02/27/17 1321  NA 137  K 4.6  CL 100*  CO2 23  GLUCOSE 144*  BUN 15  CREATININE 1.39*  CALCIUM 9.8  GFRNONAA 46*  GFRAA 53*  ANIONGAP 14     Hematology Recent Labs  Lab 02/27/17 1321  WBC 9.7  RBC 5.24  HGB 15.9  HCT 47.4  MCV 90.5  MCH 30.3  MCHC 33.5  RDW 15.8*  PLT 167    Cardiac EnzymesNo results for input(s): TROPONINI in the last 168 hours. No results for input(s): TROPIPOC in the last 168 hours.   BNPNo results for input(s): BNP, PROBNP in the last 168 hours.   DDimer  No results for input(s): DDIMER in the last 168 hours.   Radiology    Dg Chest 2 View  Result Date: 03/03/2017 CLINICAL DATA:  Status post defibrillator exchange EXAM: CHEST  2 VIEW COMPARISON:  12/23/2016 FINDINGS: Cardiac shadow is stable. Defibrillator is again noted with interval change in the leads consistent with the given clinical history. No pneumothorax is noted. The lungs are well aerated bilaterally. Chronic blunting of the costophrenic angles is seen. No bony abnormality is seen. IMPRESSION: Chronic changes without acute abnormality. Electronically Signed   By: [name] M.D.   On: 03/03/2017 11:55    Cardiac Studies   none  Patient Profile     84 y.o. male admitted for ICD lead extraction due to a broken ICD lead, with weakness postop and deemed by PT service to need SNF  Assessment & Plan    1. Broken ICD lead - he is s/p extraction. His new device is working normally. Pocket looks good with minimal ecchymosis 2. Atrial fib - his ventricular rate is controlled. He is not on an anti-coag due to prior SDH. 3. Post op confusion -  this has resolved 4. Weakness - he lives alone and has been deemed by PT service to require SNF. He is medically stable and is ready for discharge from the hospital. Please DC to SNF.   [name],M.D.  For questions or updates, please contact [place] Please consult [email or web address] for contact info under Cardiology/STEMI.      Signed, Joshua Peru, MD  03/04/2017, 8:51 AM  Patient ID: [name], male   DOB: 10-22-1932, 84 y.o.   MRN: [id number]

## 2017-03-04 NOTE — Progress Notes (Signed)
CSW left patient's son a voicemail to discuss skilled nursing placement.   Percell Locus Sharnetta Gielow LCSW [phone]

## 2017-03-04 NOTE — Discharge Instructions (Signed)
° °  Supplemental Discharge Instructions for  Pacemaker/Defibrillator Patients  Activity No heavy lifting or vigorous activity with your left/right arm for 6 to 8 weeks.  Do not raise your left/right arm above your head for one week.  Gradually raise your affected arm as drawn below.                     03/07/2017            03/08/2017              03/09/2017                03/10/2017            NO DRIVING until cleared by your doctor and care team.  [place] the wound area clean and dry.  Do not get this area wet for one week. No showers for one week; you may shower on      03/10/2017        . - The tape/steri-strips on your wound will fall off; do not pull them off.  No bandage is needed on the site.  DO  NOT apply any creams, oils, or ointments to the wound area. - If you notice any drainage or discharge from the wound, any swelling or bruising at the site, or you develop a fever > 101? F after you are discharged home, call the office at once.  Special Instructions - You are still able to use cellular telephones; use the ear opposite the side where you have your pacemaker/defibrillator.  Avoid carrying your cellular phone near your device. - When traveling through airports, show security personnel your identification card to avoid being screened in the metal detectors.  Ask the security personnel to use the hand wand. - Avoid arc welding equipment, MRI testing (magnetic resonance imaging), TENS units (transcutaneous nerve stimulators).  Call the office for questions about other devices. - Avoid electrical appliances that are in poor condition or are not properly grounded. - Microwave ovens are safe to be near or to operate.  Additional information for defibrillator patients should your device go off: - If your device goes off ONCE and you feel fine afterward, notify the device clinic nurses. - If your device goes off ONCE and you do not feel well afterward, call 911. - If your  device goes off TWICE, call 911. - If your device goes off THREE times in one day, call 911.  DO NOT DRIVE YOURSELF OR A FAMILY MEMBER WITH A DEFIBRILLATOR TO THE HOSPITAL--CALL 911.

## 2017-03-04 NOTE — Discharge Summary (Signed)
Discharge Summary    Patient ID: [name],  MRN: [id number], DOB/AGE: February 15, 1932 84 y.o.  Admit date: 03/02/2017 Discharge date: 03/04/2017  Primary Care Provider: [name] Primary Cardiologist: Peter [name], MD Primary Electrophysiologist: Dr. Lovena Le  Discharge Diagnoses    Principal Problem:   Failure of implantable cardioverter-defibrillator (ICD) lead Active Problems:   ventricular tachycardia   Chronic atrial fibrillation [facility])   Chronic anticoagulation discontinued July 2013 after developed subdural hematoma   Acute confusion  Allergies Allergies  Allergen Reactions  . Tamsulosin Other (See Comments)    Dizziness, Made BP very low and weakness  . Celebrex [Celecoxib] Hives and Nausea And Vomiting    Gi upset  . Dronedarone Nausea And Vomiting and Other (See Comments)    GI upset, abdominal pain  . Esomeprazole Magnesium Hives and Other (See Comments)    "don't really remember"  . Digoxin Diarrhea    May have caused some diarrhea  . Hydrocodone-Acetaminophen Other (See Comments)    Bad headache  . Protonix [Pantoprazole Sodium] Nausea And Vomiting and Other (See Comments)    Tolerates Dexilant    Diagnostic Studies/Procedures    ECHO TEE with lead removal 03/02/17 _____________   History of Present Illness     84 yo M with a hx of chronic systolic heart failure, chronic atrial fibrillation (anticoagualtion stopped in 2013 after subdural hematoma), ventricular tachycardia s/p ICD, CAD s/p CABG 1983 and stenting, DM, HLD, GERD, HTN, CKD III per labs, and recent admission for rectal bleeding who was admitted with malfunctioning ICD lead.   Hospital Course     On 03/02/17 he underwent extraction of a previously implanted atrial and defibrillation lead secondary to nonfunction of the defibrillation lead in the setting of an occluded left subclavian vein, and insertion of a new active-fixation defibrillation lead.  Post-op course was notable for  agitation and confusion. He would not comply with testing intermittently and was trying to hit staff at times. He required a dose of Haldol. This resolved by day of discharge. Post-op chest xray was acceptable. Interrogation reveals normal device function. Dr. Lovena Le reviewed his home medications and wants him to resume Lasix, as well as current dose of Digoxin. The pt was seen by PT and noted to have weakness and unsteadiness and is considered a high fall risk. It is recommended that he pursue SNF at discharge.   Dr. Lovena Le has seen an examined the pt who feels that he is stable for discharge to SNF when a bed is available. Of note the patient is on Tramadol chronically for pain control. Review of opiate database shows no inappropriate fills of this medication recently. A paper prescription was provided short term due to regulations for SNF; recommend SNF physician take over prescribing once at facility.  Consultants: None _____________  Discharge Vitals Blood pressure 126/73, pulse 79, temperature 98.1 F (36.7 C), temperature source Oral, resp. rate 17, height 6' (1.829 m), weight 180 lb 8 oz (81.9 kg), SpO2 97 %.  Filed Weights   03/02/17 0831  Weight: 180 lb 8 oz (81.9 kg)    Labs & Radiologic Studies   ____________  Dg Chest 2 View  Result Date: 03/03/2017 CLINICAL DATA:  Status post defibrillator exchange EXAM: CHEST  2 VIEW COMPARISON:  12/23/2016 FINDINGS: Cardiac shadow is stable. Defibrillator is again noted with interval change in the leads consistent with the given clinical history. No pneumothorax is noted. The lungs are well aerated bilaterally. Chronic blunting of  the costophrenic angles is seen. No bony abnormality is seen. IMPRESSION: Chronic changes without acute abnormality. Electronically Signed   By: Inez Catalina M.D.   On: 03/03/2017 11:55   Ct Abdomen Pelvis W Contrast  Result Date: 02/06/2017 CLINICAL DATA:  84 year old male with 2 days of abdominal pain and diarrhea.  EXAM: CT ABDOMEN AND PELVIS WITH CONTRAST TECHNIQUE: Multidetector CT imaging of the abdomen and pelvis was performed using the standard protocol following bolus administration of intravenous contrast. CONTRAST:  171mL ISOVUE-300 IOPAMIDOL (ISOVUE-300) INJECTION 61% COMPARISON:  CT Abdomen and Pelvis 12/23/2016 and earlier. FINDINGS: Lower chest: Partially visible AICD leads. Stable cardiomegaly. No pericardial effusion. Stable lung base ventilation. Chronic lung base scarring and opacity, maximal in the medial basal segment of the right lower lobe where chronic round atelectasis is suspected. Unchanged trace right pleural fluid. Hepatobiliary: Surgically absent gallbladder. Stable liver including lobulated contour. No biliary ductal enlargement. Pancreas: Negative. Spleen: Stable lobulated spleen with benign calcification. Adrenals/Urinary Tract: Normal adrenal glands. Bilateral renal enhancement and contrast excretion is symmetric and within normal limits. Chronic bilateral nephrolithiasis is stable measuring 5-6 mm in each lower pole. No acute perinephric stranding. Negative course of both ureters. Mildly to moderately distended but otherwise unremarkable urinary bladder. Stomach/Bowel: Indistinct appearance of the rectum which appears to be fluid-filled but may be opacified due to mucosal thickening/inflammation (series 2, image 76). The sigmoid colon is decompressed. There is chronic proximal sigmoid diverticula without active inflammation. Decompressed descending colon. Negative transverse colon. Negative right colon and appendix (coronal image 44). Negative terminal ileum. Intermittent fluid and gas-filled small bowel loops, but no abnormally dilated small bowel. Decompressed stomach and duodenum. No abdominal free fluid or free air. Vascular/Lymphatic: Aortoiliac calcified atherosclerosis. Major arterial structures remain patent. Stable ectasia of the infrarenal abdominal aorta up to 25 mm diameter. Similar  stable bilateral iliac artery ectasia. Portal venous system appears patent. No lymphadenopathy. Reproductive: Negative. Other: No pelvic free fluid, but mild presacral stranding since November (series 2, image 74). Musculoskeletal: Prior sternotomy. Stable visualized osseous structures. Chronic epicardial pacer leads in the left ventral abdominal wall are stable. IMPRESSION: 1. Possible acute proctitis, with presacral stranding and indistinct appearance of the rectum. No associated free fluid or complicating features. 2. Elsewhere the bowel appears stable and noninflamed. 3. No other acute finding identified in the abdomen or pelvis. 4. Chronic findings including right lower lobe round atelectasis, nephrolithiasis, Aortic Atherosclerosis (ICD10-I70.0) and Ectatic abdominal aorta at risk for aneurysm development. Recommend followup by Aortic Ultrasound in 5 years. This recommendation follows ACR consensus guidelines: White Paper of the ACR Incidental Findings Committee II on Vascular Findings. J Am Coll Radiol 2013; 10:789-794. Electronically Signed   By: Genevie Ann M.D.   On: 02/06/2017 13:46   Disposition   Pt is being discharged to SNF today in good condition.  Follow-up Plans & Appointments    Follow-up Information    [facility] up.   Specialty:  Cardiology Why:  Follow up as below on 2/13 at 2pm for wound check and 05/31/17 at 1045am with Dr. Lovena Le as below.  Contact information: [address], [place] [phone]          Discharge Medications   Allergies as of 03/04/2017      Reactions   Tamsulosin Other (See Comments)   Dizziness, Made BP very low and weakness   Celebrex [celecoxib] Hives, Nausea And Vomiting   Gi upset   Dronedarone Nausea  And Vomiting, Other (See Comments)   GI upset, abdominal pain   Esomeprazole Magnesium Hives, Other (See Comments)   "don't really remember"   Digoxin Diarrhea   May have  caused some diarrhea   Hydrocodone-acetaminophen Other (See Comments)   Bad headache   Protonix [pantoprazole Sodium] Nausea And Vomiting, Other (See Comments)   Tolerates Dexilant      Medication List    STOP taking these medications   DULERA 200-5 MCG/ACT Aero Generic drug:  mometasone-formoterol     TAKE these medications   ADVAIR DISKUS 250-50 MCG/DOSE Aepb Generic drug:  Fluticasone-Salmeterol Inhale 1 puff into the lungs 2 (two) times daily.   calcium carbonate 500 MG chewable tablet Commonly known as:  TUMS - dosed in mg elemental calcium Chew 1 tablet by mouth daily as needed for heartburn.   digoxin 0.125 MG tablet Commonly known as:  LANOXIN Take 1 tablet (0.125 mg total) by mouth daily.   fluticasone 50 MCG/ACT nasal spray Commonly known as:  FLONASE Place 2 sprays into both nostrils daily. What changed:    how much to take  when to take this  reasons to take this   furosemide 40 MG tablet Commonly known as:  LASIX Take 1 tablet (40 mg total) by mouth daily. May take an extra tab daily as needed for swelling   gabapentin 100 MG capsule Commonly known as:  NEURONTIN TAKE 1 CAPSULE (100MG ) TO 3 CAPSULES (300MG ) EACH NIGHT AS NEEDED FOR MUSCLE PAINS What changed:    how much to take  how to take this  when to take this  additional instructions   LINZESS 72 MCG capsule Generic drug:  linaclotide Take 72 mcg by mouth daily as needed (for constipation).   loratadine 10 MG tablet Commonly known as:  CLARITIN Take 10 mg by mouth daily as needed for allergies.   metoprolol succinate 25 MG 24 hr tablet Commonly known as:  TOPROL-XL Take 1 tablet (25 mg total) by mouth daily.   nitroGLYCERIN 0.4 MG SL tablet Commonly known as:  NITROSTAT PLACE ONE UNDER TONGUE FOR CHEST PAIN. What changed:  See the new instructions.   polyethylene glycol packet Commonly known as:  MIRALAX / GLYCOLAX Take 17 g by mouth daily as needed for mild constipation.     potassium chloride 10 MEQ tablet Commonly known as:  K-DUR,KLOR-CON Take 1 tablet (10 mEq total) by mouth daily.   ranitidine 150 MG tablet Commonly known as:  ZANTAC Take 1 tablet (150 mg total) 2 (two) times daily by mouth.   RESTASIS 0.05 % ophthalmic emulsion Generic drug:  cycloSPORINE INSTILL 1 DROP INTO BOTH EYES TWICE A DAY   traMADol 50 MG tablet Commonly known as:  ULTRAM Take 100 mg by mouth every 8 hours as needed for pain       Outstanding Labs/Studies   None  Duration of Discharge Encounter   Greater than 30 minutes including physician time.  SignedKathyrn Drown NP 03/04/2017, 12:24 PM  Addendum: 10:22 AM 03/05/2017 - bed is now available. HR slightly elevated with activity this AM but patient asymptomatic. Labs nonacute. Mildly elevated WBC but Dr. Lovena Le has reviewed and no obvious signs of infection. Most recent HR 83. OK to DC to SNF per Dr. Lovena Le. Burna Mortimer  EP Attending  Patient seen and examined. Agree with above. He is stable for DC home. See my note as well.   Mikle Bosworth.D

## 2017-03-04 NOTE — Progress Notes (Signed)
Patient's son returned CSW's call. He stated he was unaware of what was going on with patient. CSW explained that patient is documented as only oriented to person and place and that CSW is required to contact family to help make discharge decisions since patient lives alone. CSW provided him with nursing station number. He will contact CSW back.   Percell Locus Italo Banton LCSW [phone]

## 2017-03-05 DIAGNOSIS — I482 Chronic atrial fibrillation: Secondary | ICD-10-CM

## 2017-03-05 DIAGNOSIS — G894 Chronic pain syndrome: Secondary | ICD-10-CM | POA: Diagnosis not present

## 2017-03-05 DIAGNOSIS — I499 Cardiac arrhythmia, unspecified: Secondary | ICD-10-CM | POA: Diagnosis not present

## 2017-03-05 DIAGNOSIS — Z5309 Procedure and treatment not carried out because of other contraindication: Secondary | ICD-10-CM | POA: Diagnosis not present

## 2017-03-05 DIAGNOSIS — I4891 Unspecified atrial fibrillation: Secondary | ICD-10-CM | POA: Diagnosis not present

## 2017-03-05 DIAGNOSIS — M6281 Muscle weakness (generalized): Secondary | ICD-10-CM | POA: Diagnosis not present

## 2017-03-05 DIAGNOSIS — R2681 Unsteadiness on feet: Secondary | ICD-10-CM | POA: Diagnosis not present

## 2017-03-05 DIAGNOSIS — Z9889 Other specified postprocedural states: Secondary | ICD-10-CM | POA: Diagnosis not present

## 2017-03-05 DIAGNOSIS — M549 Dorsalgia, unspecified: Secondary | ICD-10-CM | POA: Diagnosis not present

## 2017-03-05 DIAGNOSIS — R079 Chest pain, unspecified: Secondary | ICD-10-CM | POA: Diagnosis not present

## 2017-03-05 DIAGNOSIS — T82110A Breakdown (mechanical) of cardiac electrode, initial encounter: Secondary | ICD-10-CM | POA: Diagnosis not present

## 2017-03-05 DIAGNOSIS — R488 Other symbolic dysfunctions: Secondary | ICD-10-CM | POA: Diagnosis not present

## 2017-03-05 DIAGNOSIS — Z95810 Presence of automatic (implantable) cardiac defibrillator: Secondary | ICD-10-CM | POA: Diagnosis not present

## 2017-03-05 DIAGNOSIS — Z5189 Encounter for other specified aftercare: Secondary | ICD-10-CM | POA: Diagnosis not present

## 2017-03-05 LAB — BASIC METABOLIC PANEL
ANION GAP: 12 (ref 5–15)
BUN: 11 mg/dL (ref 6–20)
CALCIUM: 9.3 mg/dL (ref 8.9–10.3)
CO2: 27 mmol/L (ref 22–32)
Chloride: 100 mmol/L — ABNORMAL LOW (ref 101–111)
Creatinine, Ser: 1.26 mg/dL — ABNORMAL HIGH (ref 0.61–1.24)
GFR calc Af Amer: 60 mL/min — ABNORMAL LOW (ref >60)
GFR calc non Af Amer: 51 mL/min — ABNORMAL LOW (ref >60)
Glucose, Bld: 175 mg/dL — ABNORMAL HIGH (ref 65–99)
Potassium: 3.7 mmol/L (ref 3.5–5.1)
Sodium: 139 mmol/L (ref 135–145)

## 2017-03-05 LAB — CBC
HCT: 44.9 % (ref 39.0–52.0)
HEMOGLOBIN: 14.7 g/dL (ref 13.0–17.0)
MCH: 30.1 pg (ref 26.0–34.0)
MCHC: 32.7 g/dL (ref 30.0–36.0)
MCV: 91.8 fL (ref 78.0–100.0)
Platelets: 125 10*3/uL — ABNORMAL LOW (ref 150–400)
RBC: 4.89 MIL/uL (ref 4.22–5.81)
RDW: 16.5 % — ABNORMAL HIGH (ref 11.5–15.5)
WBC: 11.5 10*3/uL — AB (ref 4.0–10.5)

## 2017-03-05 LAB — DIGOXIN LEVEL: DIGOXIN LVL: 0.6 ng/mL — AB (ref 0.8–2.0)

## 2017-03-05 NOTE — Progress Notes (Signed)
Progress Note  Patient Name: [name] Date of Encounter: 03/05/2017  Primary Care Provider: [name] Primary Cardiologist: Peter [name], MD Primary Electrophysiologist: Dr. Lovena Le  Subjective   No complaints, except he doesn't like the food here. No CP, SOB. A+Ox3. HR up somewhat today.  Inpatient Medications    Scheduled Meds: . digoxin  0.125 mg Oral Daily  . furosemide  40 mg Oral Daily  . metoprolol succinate  25 mg Oral Daily  . mometasone-formoterol  2 puff Inhalation BID  . potassium chloride  10 mEq Oral Daily   Continuous Infusions:  PRN Meds: acetaminophen, calcium carbonate, linaclotide, loratadine, ondansetron (ZOFRAN) IV, polyethylene glycol   Vital Signs    Vitals:   03/04/17 2010 03/04/17 2056 03/04/17 2105 03/05/17 0527  BP: 126/79 117/62  126/73  Pulse: 100 88 81 79  Resp: (!) 21 (!) 21 18 17   Temp: 97.8 F (36.6 C) 97.8 F (36.6 C)  98.1 F (36.7 C)  TempSrc: Oral Oral  Oral  SpO2: 95%  98%   Weight:      Height:        Intake/Output Summary (Last 24 hours) at 03/05/2017 0726 Last data filed at 03/04/2017 2010 Gross per 24 hour  Intake 720 ml  Output 1050 ml  Net -330 ml   Filed Weights   03/02/17 0831  Weight: 180 lb 8 oz (81.9 kg)    Telemetry    Atrial fib rates 80-90s overnight, 95-115 this AM, occ PVCs, brief bigeminy - Personally Reviewed  Physical Exam   GEN: No acute distress, frail, elderly HEENT: Normocephalic, atraumatic, sclera non-icteric. Neck: No JVD or bruits. Cardiac: Irregularly irregular, no murmurs, rubs, or gallops.  Radials/DP/PT 1+ and equal bilaterally.  Respiratory: Clear to auscultation bilaterally. Breathing is unlabored. GI: Soft, nontender, non-distended, BS +x 4. MS: no deformity. Extremities: Senile purpura noted. No clubbing or cyanosis. No edema. Distal pedal pulses are 2+ and equal bilaterally. Neuro:  AAOx3. Follows commands. Psych:  Responds to questions appropriately with a  normal affect.  Labs    Chemistry Recent Labs  Lab 02/27/17 1321  NA 137  K 4.6  CL 100*  CO2 23  GLUCOSE 144*  BUN 15  CREATININE 1.39*  CALCIUM 9.8  GFRNONAA 46*  GFRAA 53*  ANIONGAP 14     Hematology Recent Labs  Lab 02/27/17 1321  WBC 9.7  RBC 5.24  HGB 15.9  HCT 47.4  MCV 90.5  MCH 30.3  MCHC 33.5  RDW 15.8*  PLT 167    Radiology    Dg Chest 2 View  Result Date: 03/03/2017 CLINICAL DATA:  Status post defibrillator exchange EXAM: CHEST  2 VIEW COMPARISON:  12/23/2016 FINDINGS: Cardiac shadow is stable. Defibrillator is again noted with interval change in the leads consistent with the given clinical history. No pneumothorax is noted. The lungs are well aerated bilaterally. Chronic blunting of the costophrenic angles is seen. No bony abnormality is seen. IMPRESSION: Chronic changes without acute abnormality. Electronically Signed   By: Inez Catalina M.D.   On: 03/03/2017 11:55    Cardiac Studies   ECHO TEE with lead removal 03/02/17  Patient Profile     84 y.o. male with hx of chronic systolic heart failure, chronic atrial fibrillation (anticoagualtion stopped in 2013 after subdural hematoma), ventricular tachycardia s/p ICD, CAD s/p CABG 1983 and stenting, DM, HLD, GERD, HTN, CKD III per labs, and recent admission for rectal bleeding who was admitted with malfunctioning ICD lead.  Assessment & Plan    1. VT with ICD lead malfunction s/p revision 2. Chronic systolic CHF 3. Chronic atrial fib (not on anticoag due to h/o SDH) 4. CKD stage III 5. Confusion/AMS 6. Recent admission for rectal bleeding with digoxin added for rate control at that time  Patient felt stable for dc yesterday but bed was not yet available. Today he is A+O and has no complaints, but HR is noted to be around 100-115 at rest this AM. I'm not sure if this was because he was struggling to open a carton of milk, but will update CBC, BMET, digoxin level prior to formally releasing him just  to ensure stability.  If these are stable, anticipate dc to SNF today. Volume status looks stable.  For questions or updates, please contact [place] Please consult [email or web address] for contact info under Cardiology/STEMI.  Signed, Charlie Pitter, PA-C 03/05/2017, 7:26 AM

## 2017-03-05 NOTE — Clinical Social Work Placement (Signed)
   CLINICAL SOCIAL WORK PLACEMENT  NOTE  Date:  03/05/2017  Patient Details  Name: XAIVER ROSKELLEY MRN: [id number] Date of Birth: 24-May-1932  Clinical Social Work is seeking post-discharge placement for this patient at the [place] level of care (*CSW will initial, date and re-position this form in  chart as items are completed):  Yes   Patient/family provided with [place] Work Department's list of facilities offering this level of care within the geographic area requested by the patient (or if unable, by the patient's family).  Yes   Patient/family informed of their freedom to choose among providers that offer the needed level of care, that participate in Medicare, Medicaid or managed care program needed by the patient, have an available bed and are willing to accept the patient.  Yes   Patient/family informed of [facility]'s ownership interest in [facility], as well as of the fact that they are under no obligation to receive care at these facilities.  PASRR submitted to EDS on       PASRR number received on       Existing PASRR number confirmed on 03/04/17     FL2 transmitted to all facilities in geographic area requested by pt/family on 03/04/17     FL2 transmitted to all facilities within larger geographic area on       Patient informed that his/her managed care company has contracts with or will negotiate with certain facilities, including the following:        Yes   Patient/family informed of bed offers received.  Patient chooses bed at [facility]     Physician recommends and patient chooses bed at      Patient to be transferred to [facility] on 03/05/17.  Patient to be transferred to facility by [facility]     Patient family notified on 03/05/17 of transfer.  Name of family member notified:  Patient states he will notify his own family and friends     PHYSICIAN Please sign FL2     Additional Comment:    Barbette Or, [place]

## 2017-03-05 NOTE — Progress Notes (Signed)
Successfully called report to receiving RN at [facility].

## 2017-03-05 NOTE — Progress Notes (Signed)
Progress Note  Patient Name: CLARKE PERETZ Date of Encounter: 03/05/2017  Primary Cardiologist: Peter [name], MD   Subjective   No chest pain or sob. Ambulating in room. Mentation is back to baseline.  Inpatient Medications    Scheduled Meds: . digoxin  0.125 mg Oral Daily  . furosemide  40 mg Oral Daily  . metoprolol succinate  25 mg Oral Daily  . mometasone-formoterol  2 puff Inhalation BID  . potassium chloride  10 mEq Oral Daily   Continuous Infusions:  PRN Meds: acetaminophen, calcium carbonate, linaclotide, loratadine, ondansetron (ZOFRAN) IV, polyethylene glycol   Vital Signs    Vitals:   03/04/17 2056 03/04/17 2105 03/05/17 0527 03/05/17 0810  BP: 117/62  126/73   Pulse: 88 81 79   Resp: (!) 21 18 17    Temp: 97.8 F (36.6 C)  98.1 F (36.7 C)   TempSrc: Oral  Oral   SpO2:  98%  97%  Weight:      Height:        Intake/Output Summary (Last 24 hours) at 03/05/2017 0939 Last data filed at 03/05/2017 0859 Gross per 24 hour  Intake 720 ml  Output 925 ml  Net -205 ml   Filed Weights   03/02/17 0831  Weight: 180 lb 8 oz (81.9 kg)    Telemetry    Atrial fib with a CVR/RVR - Personally Reviewed  ECG    none - Personally Reviewed  Physical Exam   GEN: No acute distress.   Neck: No JVD Cardiac: IRIR, no murmurs, rubs, or gallops.  Respiratory: Clear to auscultation bilaterally. Incision is clean and dry GI: Soft, nontender, non-distended  MS: No edema; No deformity. Neuro:  Nonfocal  Psych: Normal affect   Labs    Chemistry Recent Labs  Lab 02/27/17 1321  NA 137  K 4.6  CL 100*  CO2 23  GLUCOSE 144*  BUN 15  CREATININE 1.39*  CALCIUM 9.8  GFRNONAA 46*  GFRAA 53*  ANIONGAP 14     Hematology Recent Labs  Lab 02/27/17 1321  WBC 9.7  RBC 5.24  HGB 15.9  HCT 47.4  MCV 90.5  MCH 30.3  MCHC 33.5  RDW 15.8*  PLT 167    Cardiac EnzymesNo results for input(s): TROPONINI in the last 168 hours. No results for input(s):  TROPIPOC in the last 168 hours.   BNPNo results for input(s): BNP, PROBNP in the last 168 hours.   DDimer No results for input(s): DDIMER in the last 168 hours.   Radiology    Dg Chest 2 View  Result Date: 03/03/2017 CLINICAL DATA:  Status post defibrillator exchange EXAM: CHEST  2 VIEW COMPARISON:  12/23/2016 FINDINGS: Cardiac shadow is stable. Defibrillator is again noted with interval change in the leads consistent with the given clinical history. No pneumothorax is noted. The lungs are well aerated bilaterally. Chronic blunting of the costophrenic angles is seen. No bony abnormality is seen. IMPRESSION: Chronic changes without acute abnormality. Electronically Signed   By: Inez Catalina M.D.   On: 03/03/2017 11:55    Cardiac Studies   none  Patient Profile     84 y.o. male admitted for ICD lead removal and insertion of a new lead due to a fractured ICD lead.  Assessment & Plan    1. ICD lead fracture - he is s/p removal. He is ready for dc 2. immobilty - he was thought not to be safe for DC by PT service and is awaiting  a SNF. He is ambulating with a cane. 3. Chronic atrial fib - his rate is controlled. He will continue his current meds. He is not a candidate for systemic anti-coagulation due to SDH.  For questions or updates, please contact [place] Please consult [email or web address] for contact info under Cardiology/STEMI.      Signed, Cristopher Peru, MD  03/05/2017, 9:39 AM  Patient ID: Elmo Putt, male   DOB: 1932-05-31, 84 y.o.   MRN: [id number]

## 2017-03-05 NOTE — Progress Notes (Signed)
Attempted to call report to [facility].

## 2017-03-05 NOTE — Clinical Social Work Note (Signed)
Clinical Social Worker facilitated patient discharge including contacting patient and facility to confirm patient discharge plans.  Clinical information faxed to facility and patient agreeable with plan.  CSW arranged ambulance transport via [facility] to [facility]  RN to call report prior to discharge.  Patient states he will update family and friends of discharge plans for today.  Clinical Social Worker will sign off for now as social work intervention is no longer needed. Please consult [place] again if new need arises.  Barbette Or, [place]

## 2017-03-05 NOTE — Care Management Note (Signed)
Case Management Note Marvetta Gibbons RN, BSN Unit 4E-Case Manager [phone]  Patient Details  Name: [name] MRN: [id number] Date of Birth: 10-20-1932  Subjective/Objective:  Pt admitted with ICD failed lead with replacement of lead                   Action/Plan: PTA pt lived at home alone, independent (still drives)- pt was active with [facility] and also had HHPT with [facility]- post procedure confusion- per PT eval recommendation for SNF- pt's son lives in [place] consulted for possible placement- if pt improves and can return home will need resumption orders for [facility] services- CM to follow for transition of care needs.   Expected Discharge Date:  03/05/17               Expected Discharge Plan:  [place]  In-House Referral:  Clinical Social Work  Discharge planning Services  CM Consult  Post Acute Care Choice:  Home Health, Resumption of Svcs/PTA Provider Choice offered to:     DME Arranged:    DME Agency:     HH Arranged:    [place] Agency:  [place]  Status of Service:  Completed, signed off  If discussed at [employer] of Stay Meetings, dates discussed:    Discharge Disposition: skilled facility   Additional Comments:  03/05/17- 1025- Cabella Kimm RN, CM- pt for d/c today to SNF- CSW following for placement needs  Dawayne Patricia, RN 03/05/2017, 10:28 AM

## 2017-03-05 NOTE — Progress Notes (Signed)
Attempted to call report to [facility].

## 2017-03-06 ENCOUNTER — Other Ambulatory Visit: Payer: Self-pay | Admitting: *Deleted

## 2017-03-06 DIAGNOSIS — I5022 Chronic systolic (congestive) heart failure: Secondary | ICD-10-CM

## 2017-03-06 DIAGNOSIS — M6281 Muscle weakness (generalized): Secondary | ICD-10-CM | POA: Diagnosis not present

## 2017-03-06 DIAGNOSIS — G894 Chronic pain syndrome: Secondary | ICD-10-CM | POA: Diagnosis not present

## 2017-03-06 NOTE — Progress Notes (Signed)
This encounter was created in error - please disregard.

## 2017-03-06 NOTE — Consult Note (Signed)
[facility] Care Management follow up.  Chart reviewed. Noted [name] discharged to [facility] SNF on 03/05/17.  Will make referral to [facility] for follow up while at [facility].   Marthenia Rolling, MSN-Ed, RN,BSN [facility] Liaison [phone]

## 2017-03-08 ENCOUNTER — Encounter (HOSPITAL_COMMUNITY): Payer: Self-pay | Admitting: Internal Medicine

## 2017-03-08 DIAGNOSIS — G894 Chronic pain syndrome: Secondary | ICD-10-CM | POA: Diagnosis not present

## 2017-03-08 DIAGNOSIS — I4891 Unspecified atrial fibrillation: Secondary | ICD-10-CM | POA: Diagnosis not present

## 2017-03-08 DIAGNOSIS — M6281 Muscle weakness (generalized): Secondary | ICD-10-CM | POA: Diagnosis not present

## 2017-03-08 DIAGNOSIS — Z5309 Procedure and treatment not carried out because of other contraindication: Secondary | ICD-10-CM | POA: Diagnosis not present

## 2017-03-08 DIAGNOSIS — T82110A Breakdown (mechanical) of cardiac electrode, initial encounter: Secondary | ICD-10-CM | POA: Diagnosis not present

## 2017-03-08 DIAGNOSIS — Z9889 Other specified postprocedural states: Secondary | ICD-10-CM | POA: Diagnosis not present

## 2017-03-09 ENCOUNTER — Encounter: Payer: Self-pay | Admitting: Licensed Clinical Social Worker

## 2017-03-09 ENCOUNTER — Other Ambulatory Visit: Payer: Self-pay | Admitting: Licensed Clinical Social Worker

## 2017-03-09 NOTE — Patient Outreach (Signed)
; [name]) Care Management  [facility] Social Work  03/09/2017  [name] 09-Jun-1932 [id number]  Subjective:    Objective:   Encounter Medications:  Outpatient Encounter Medications as of 03/09/2017  Medication Sig  . ADVAIR DISKUS 250-50 MCG/DOSE AEPB Inhale 1 puff into the lungs 2 (two) times daily.   . calcium carbonate (TUMS - DOSED IN MG ELEMENTAL CALCIUM) 500 MG chewable tablet Chew 1 tablet by mouth daily as needed for heartburn.   . digoxin (LANOXIN) 0.125 MG tablet Take 1 tablet (0.125 mg total) by mouth daily.  . fluticasone (FLONASE) 50 MCG/ACT nasal spray Place 2 sprays into both nostrils daily. (Patient taking differently: Place 1 spray into both nostrils daily as needed for allergies. )  . furosemide (LASIX) 40 MG tablet Take 1 tablet (40 mg total) by mouth daily. May take an extra tab daily as needed for swelling  . gabapentin (NEURONTIN) 100 MG capsule TAKE 1 CAPSULE (100MG) TO 3 CAPSULES (300MG) EACH NIGHT AS NEEDED FOR MUSCLE PAINS (Patient taking differently: Take 200 mg by mouth at bedtime. )  . LINZESS 72 MCG capsule Take 72 mcg by mouth daily as needed (for constipation).   Marland Kitchen loratadine (CLARITIN) 10 MG tablet Take 10 mg by mouth daily as needed for allergies.   . metoprolol succinate (TOPROL-XL) 25 MG 24 hr tablet Take 1 tablet (25 mg total) by mouth daily.  . nitroGLYCERIN (NITROSTAT) 0.4 MG SL tablet PLACE ONE UNDER TONGUE FOR CHEST PAIN. (Patient taking differently: Place 0.4 mg under tongue as needed for chest pain)  . polyethylene glycol (MIRALAX / GLYCOLAX) packet Take 17 g by mouth daily as needed for mild constipation.  . potassium chloride (K-DUR,KLOR-CON) 10 MEQ tablet Take 1 tablet (10 mEq total) by mouth daily.  . ranitidine (ZANTAC) 150 MG tablet Take 1 tablet (150 mg total) 2 (two) times daily by mouth.  . RESTASIS 0.05 % ophthalmic emulsion INSTILL 1 DROP INTO BOTH EYES TWICE A DAY  . traMADol (ULTRAM) 50 MG tablet Take 100 mg by  mouth every 8 hours as needed for pain   No facility-administered encounter medications on file as of 03/09/2017.     Functional Status:  In your present state of health, do you have any difficulty performing the following activities: 03/09/2017 02/27/2017  Hearing? N N  Vision? N N  Difficulty concentrating or making decisions? Y Y  Comment - -  Walking or climbing stairs? Y Y  Dressing or bathing? Y N  Doing errands, shopping? Y N  Preparing Food and eating ? Y -  Using the Toilet? N -  In the past six months, have you accidently leaked urine? N -  Do you have problems with loss of bowel control? N -  Managing your Medications? Y -  Managing your Finances? Y -  Housekeeping or managing your Housekeeping? Y -  Some recent data might be hidden    Fall/Depression Screening:  PHQ 2/9 Scores 12/20/2016  PHQ - 2 Score 0    Assessment:    CSW received referral on [name] CSW completed chart review on client. Client was previously living alone at his home in the community. He was hospitalized recently and following hospitalization, he was discharged to [place] facility.  Client has been receiving nursing care and physical therapy support at [place] facility.  CSW traveled to [place] facility on 03/09/17 to visit client. CSW met with client on 03/09/17  at client's room at [facility] facility. CSW introduced self .  CSW received verbal permission from client on 03/09/17 for CSW to speak with client about client needs and status. CSW and client completed needed [facility] assessments for client.  CSW and client spoke of client care plan. CSW encouraged client to participate in scheduled client physical therapy sessions in next 30 days at nursing center.  Client said that physical therapy sessions were helpful to client. Client said he uses either a cane or a walker to ambulate.  Client uses glasses to increase vision.Client is  eating adequately and sleeping adequately.  Client said previously he had been driving himself to appointments and to do errands in the community. Client said he had needed equipment for client at his home. Client said he has a son who lives in [place] and a daughter who lives in [place].  [name] said he hopes to receive physical therapy as needed for a short while at facility and then hopes to return home with supports in place. . CSW encouraged [name] to communicate with facility social worker as needed to finalize client discharge plan. CSW gave [name] [facility] CSW card and encouraged him to call CSW as needed to discuss social work needs of client. CSW thanked [name] for allowing CSW to visit him at nursing home on 03/09/17. [name] was appreciative of CSW visit.     Plan:   Client to participate in scheduled client physical therapy sessions for client in next 30 days at nursing center.  CSW to call client in 2 weeks to assess client needs.  [name].Aarianna Hoadley MSW, LCSW Licensed Clinical Social Worker [facility] Care Management [phone]

## 2017-03-10 ENCOUNTER — Encounter: Payer: Self-pay | Admitting: Internal Medicine

## 2017-03-10 DIAGNOSIS — M6281 Muscle weakness (generalized): Secondary | ICD-10-CM | POA: Diagnosis not present

## 2017-03-10 DIAGNOSIS — G894 Chronic pain syndrome: Secondary | ICD-10-CM | POA: Diagnosis not present

## 2017-03-15 ENCOUNTER — Telehealth: Payer: Self-pay

## 2017-03-15 ENCOUNTER — Ambulatory Visit: Payer: Medicare Other

## 2017-03-15 NOTE — Telephone Encounter (Signed)
Called and spoke with pt.  Pt stated that he is ready to leave the hospital and they wont let him leave. Told pt that if they advise him to stay he should but the can not hold him agisit his will. Pt wanted Dr. Sarajane Jews to just know he is at the hospital and he is sure he will do a f/u visit with Dr. Sarajane Jews soon.   Sent to PCP as an FYI   Copied from [place]. Topic: Inquiry >> Mar 13, 2017  2:02 PM Vernona Rieger wrote: Patient said he is at the hospital and wants Dr Sarajane Jews to call him [phone]

## 2017-03-16 DIAGNOSIS — M6281 Muscle weakness (generalized): Secondary | ICD-10-CM | POA: Diagnosis not present

## 2017-03-16 DIAGNOSIS — T82110A Breakdown (mechanical) of cardiac electrode, initial encounter: Secondary | ICD-10-CM | POA: Diagnosis not present

## 2017-03-16 DIAGNOSIS — G894 Chronic pain syndrome: Secondary | ICD-10-CM | POA: Diagnosis not present

## 2017-03-16 DIAGNOSIS — M549 Dorsalgia, unspecified: Secondary | ICD-10-CM | POA: Diagnosis not present

## 2017-03-16 DIAGNOSIS — I4891 Unspecified atrial fibrillation: Secondary | ICD-10-CM | POA: Diagnosis not present

## 2017-03-17 NOTE — Telephone Encounter (Signed)
Sir, please note.

## 2017-03-17 NOTE — Telephone Encounter (Signed)
Patient said he is still in the hospital & wants to know what he needs to do. I advised him of the message that [place] put in. He wants Dr Barbie Banner opinion. Call back is [phone]. He said he he is not taking the pills they are giving him, he is sticking them in his pocket.

## 2017-03-17 NOTE — Telephone Encounter (Signed)
Pt was called and made aware, verbalized he would start to take the medication while in the hospital.

## 2017-03-17 NOTE — Telephone Encounter (Signed)
Tell him that I do not have access to his notes while he is in the hospital. My best advice is to follow the doctors' orders

## 2017-03-21 ENCOUNTER — Other Ambulatory Visit: Payer: Self-pay | Admitting: Family Medicine

## 2017-03-22 ENCOUNTER — Other Ambulatory Visit: Payer: Self-pay | Admitting: Licensed Clinical Social Worker

## 2017-03-22 NOTE — Telephone Encounter (Signed)
Last OV 01/04/2017  Last refilled rx was last refilled 03-04-2017 disp 3 with no refills  Sent to PCP for approval

## 2017-03-22 NOTE — Patient Outreach (Signed)
Assessment;  CSW spoke via phone with client. CSW verified client identity. CSW received verbal permission from client on 03/22/17 for CSW to speak with client about client needs and status.  Client had been residing at [facility] for a few weeks. He had been receiving nursing care and physical therapy support at nursing facility. He  was recently admitted to the hospital for care. Client discharged from the hospital on 03/21/17 and returned to his home in the community on 03/21/17 with needed supports in place. Client said that a raised toilet seat had been ordered for him to use in his home. He said he is independent in the home for the most part. He walks via use of a cane.  CSW spoke with client about client care plan. CSW encouraged client to communicate with CSW in next 30 days to discuss community resources of assistance for client. Client did not mention any pain issues.  Client has reduced family support.  He said he was readjusting to being home. He said he had adequate food supply and is sleeping adequately. CSW encouraged Reynard to call CSW at [phone] as needed to discuss social work needs of client. Chancelor has [facility] CSW card and said he would call CSW as needed to discuss social work needs of client. CSW thanked Rajan for phone call with CSW on 03/22/17.    Plan:  Client to communicate with CSW in next 30 days to discuss community resources of assistance for client.   CSW to call client in 4 weeks to assess client needs at that time.  [name].Liborio Saccente MSW, LCSW Licensed Clinical Social Worker [facility] Care Management [phone]

## 2017-03-23 ENCOUNTER — Emergency Department (HOSPITAL_COMMUNITY)
Admission: EM | Admit: 2017-03-23 | Discharge: 2017-03-23 | Disposition: A | Discharge status: Home / Self Care | Payer: Medicare Other | Attending: Emergency Medicine | Admitting: Emergency Medicine

## 2017-03-23 ENCOUNTER — Encounter: Payer: Medicare Other | Admitting: Internal Medicine

## 2017-03-23 ENCOUNTER — Encounter (HOSPITAL_COMMUNITY): Payer: Self-pay

## 2017-03-23 ENCOUNTER — Other Ambulatory Visit: Payer: Self-pay | Admitting: Licensed Clinical Social Worker

## 2017-03-23 ENCOUNTER — Ambulatory Visit: Payer: Self-pay | Admitting: Licensed Clinical Social Worker

## 2017-03-23 ENCOUNTER — Emergency Department (HOSPITAL_COMMUNITY): Payer: Medicare Other

## 2017-03-23 DIAGNOSIS — Z95810 Presence of automatic (implantable) cardiac defibrillator: Secondary | ICD-10-CM | POA: Insufficient documentation

## 2017-03-23 DIAGNOSIS — N200 Calculus of kidney: Secondary | ICD-10-CM | POA: Diagnosis not present

## 2017-03-23 DIAGNOSIS — Z87891 Personal history of nicotine dependence: Secondary | ICD-10-CM | POA: Diagnosis not present

## 2017-03-23 DIAGNOSIS — K2970 Gastritis, unspecified, without bleeding: Secondary | ICD-10-CM | POA: Diagnosis not present

## 2017-03-23 DIAGNOSIS — K5900 Constipation, unspecified: Secondary | ICD-10-CM | POA: Diagnosis not present

## 2017-03-23 DIAGNOSIS — R0602 Shortness of breath: Secondary | ICD-10-CM | POA: Diagnosis not present

## 2017-03-23 DIAGNOSIS — I10 Essential (primary) hypertension: Secondary | ICD-10-CM | POA: Diagnosis not present

## 2017-03-23 DIAGNOSIS — I4891 Unspecified atrial fibrillation: Secondary | ICD-10-CM | POA: Insufficient documentation

## 2017-03-23 DIAGNOSIS — I509 Heart failure, unspecified: Secondary | ICD-10-CM | POA: Diagnosis not present

## 2017-03-23 DIAGNOSIS — Z79899 Other long term (current) drug therapy: Secondary | ICD-10-CM | POA: Insufficient documentation

## 2017-03-23 DIAGNOSIS — R110 Nausea: Secondary | ICD-10-CM | POA: Insufficient documentation

## 2017-03-23 DIAGNOSIS — R1013 Epigastric pain: Secondary | ICD-10-CM | POA: Diagnosis not present

## 2017-03-23 DIAGNOSIS — K529 Noninfective gastroenteritis and colitis, unspecified: Secondary | ICD-10-CM | POA: Insufficient documentation

## 2017-03-23 LAB — COMPREHENSIVE METABOLIC PANEL
ALBUMIN: 4.1 g/dL (ref 3.5–5.0)
ALT: 10 U/L — ABNORMAL LOW (ref 17–63)
ANION GAP: 15 (ref 5–15)
AST: 22 U/L (ref 15–41)
Alkaline Phosphatase: 64 U/L (ref 38–126)
BILIRUBIN TOTAL: 1.3 mg/dL — AB (ref 0.3–1.2)
BUN: 19 mg/dL (ref 6–20)
CHLORIDE: 102 mmol/L (ref 101–111)
CO2: 21 mmol/L — AB (ref 22–32)
Calcium: 9.1 mg/dL (ref 8.9–10.3)
Creatinine, Ser: 1.47 mg/dL — ABNORMAL HIGH (ref 0.61–1.24)
GFR calc Af Amer: 49 mL/min — ABNORMAL LOW (ref >60)
GFR calc non Af Amer: 43 mL/min — ABNORMAL LOW (ref >60)
GLUCOSE: 145 mg/dL — AB (ref 65–99)
POTASSIUM: 3.8 mmol/L (ref 3.5–5.1)
SODIUM: 138 mmol/L (ref 135–145)
TOTAL PROTEIN: 7 g/dL (ref 6.5–8.1)

## 2017-03-23 LAB — CBC WITH DIFFERENTIAL/PLATELET
Basophils Absolute: 0 10*3/uL (ref 0.0–0.1)
Basophils Relative: 0 %
Eosinophils Absolute: 0.1 10*3/uL (ref 0.0–0.7)
Eosinophils Relative: 2 %
HCT: 41.9 % (ref 39.0–52.0)
HEMOGLOBIN: 13.7 g/dL (ref 13.0–17.0)
LYMPHS ABS: 1.1 10*3/uL (ref 0.7–4.0)
LYMPHS PCT: 16 %
MCH: 30.3 pg (ref 26.0–34.0)
MCHC: 32.7 g/dL (ref 30.0–36.0)
MCV: 92.7 fL (ref 78.0–100.0)
Monocytes Absolute: 2.2 10*3/uL — ABNORMAL HIGH (ref 0.1–1.0)
Monocytes Relative: 31 %
NEUTROS PCT: 51 %
Neutro Abs: 3.7 10*3/uL (ref 1.7–7.7)
Platelets: 176 10*3/uL (ref 150–400)
RBC: 4.52 MIL/uL (ref 4.22–5.81)
RDW: 16.3 % — ABNORMAL HIGH (ref 11.5–15.5)
WBC: 7.1 10*3/uL (ref 4.0–10.5)

## 2017-03-23 LAB — TROPONIN I
Troponin I: <0.03 ng/mL (ref <0.03)
Troponin I: 0.03 ng/mL (ref <0.03)

## 2017-03-23 LAB — LIPASE, BLOOD: LIPASE: 27 U/L (ref 11–51)

## 2017-03-23 NOTE — ED Provider Notes (Signed)
[facility] DEPT Provider Note   CSN: [id number] Arrival date & time: 03/23/17  8676     History   Chief Complaint Chief Complaint  Patient presents with  . Abdominal Pain    HPI [name] is a 84 y.o. male with past medical history of A. fib, CHF, CAD, hypertension, AICD present, who presents to ED for evaluation of 3 month history of epigastric abdominal pain, shortness of breath, nausea. He also reports constipation for the past 3 days. Describes the pain as aching, does not radiate. Cannot recall any inciting event that may have triggered the pain, and when asked why presenting today for symptoms he states "I just cannot take it anymore, I should have come in sooner."  Denies any urinary symptoms including dysuria, hematuria, back pain, hematemesis, hematochezia or melena, fever, chest pain, cough, URI symptoms, sick contacts. He was recently seen and evaluated, admitted for failure of ICD lead, confusion on Feb 3rd.   HPI  Past Medical History:  Diagnosis Date  . Abnormal thyroid scan    Abnormal thyroid imaging studies from 11/09/2010, status post ultrasound guided fine needle aspiration of the dominant left inferior thyroid nodule on 12/15/2010. Cytology report showed rare follicular epithelial cells and hemosiderin laden macrophages.  Marland Kitchen AICD (automatic cardioverter/defibrillator) present    a. fx lead; a. s/p lead extraction 03/02/17  . Arthritis    "all over"  . Asthma   . Atrial fibrillation ([place])    on chronic Coumadin; stopped July 2013 due to subdural hematomas  . CHF (congestive heart failure) (HCC)    EF 35-40% s/p most recent ICD generator change-out with Medtronic dual-chamber ICD 05/20/11 with explantation of previous abdominally-implanted device  . Coronary artery disease    s/p CABG 1983 and PCI/stent 2004.   . Diabetes mellitus    diet controlled  . Dyslipidemia   . Erythrocytosis   . GERD (gastroesophageal reflux  disease)   . Hypertension   . Ischemic cardiomyopathy    WITH CHF  . Monocytosis 04/17/2013  . Myocardial infarction ([place]) 1983; ~ 1990  . Pneumonia August 2013  . Subdural hematoma [facility]) July 2013   Anticoagulation stopped.   . SunDown syndrome   . VT (ventricular tachycardia) [facility])     Patient Active Problem List   Diagnosis Date Noted  . Acute confusion 03/04/2017  . Failure of implantable cardioverter-defibrillator (ICD) lead 03/02/2017  . Rectal ulcer   . Rectal bleeding   . Proctitis 02/07/2017  . Abdominal pain 02/06/2017  . Acute radiation proctitis 02/06/2017  . Failure of implantable cardioverter-defibrillator lead 11/10/2016  . ICD (implantable cardioverter-defibrillator) in place 04/08/2016  . Gastroesophageal reflux disease without esophagitis 12/31/2013  . Hypotension 09/23/2011  . HCAP (healthcare-associated pneumonia) 09/23/2011  . SIRS (systemic inflammatory response syndrome) ([place]) 09/23/2011  . Thrombocytopenia ([place]) 09/23/2011  . History of subdural hemorrhage 09/08/2011  . Ataxia 08/17/2011  . Dehydration 08/17/2011  . Atrial fibrillation with controlled ventricular response ([place]) 08/13/2011  . Colon polyp 07/07/2011  . Angiodysplasia of colon 07/07/2011  . GERD (gastroesophageal reflux disease) 05/31/2011  . Anemia, iron deficiency 12/29/2010  . Chronic anticoagulation discontinued July 2013 after developed subdural hematoma 09/22/2010  . Bradycardia 09/22/2010  . CAD (coronary artery disease) 06/25/2010  . ventricular tachycardia 12/30/2009  . Chronic atrial fibrillation ([place]) 12/30/2009  . Chronic systolic heart failure- EF 35-40% 12/30/2009  . Automatic implantable cardioverter-defibrillator in situ 12/30/2009    Past Surgical History:  Procedure Laterality Date  .  CHOLECYSTECTOMY    . COLONOSCOPY  07/07/2011   Procedure: COLONOSCOPY;  Surgeon: Jerene Bears, MD;  Location: [facility] ENDOSCOPY;  Service: Gastroenterology;  Laterality: N/A;  . CORONARY  ANGIOPLASTY WITH STENT PLACEMENT  2004   Tandem Cypher stents LAD  . CORONARY ARTERY BYPASS GRAFT  1983   SVG-mLAD  . ESOPHAGOGASTRODUODENOSCOPY  02/11/2011   Procedure: ESOPHAGOGASTRODUODENOSCOPY (EGD);  Surgeon: Beryle Beams, MD;  Location: [facility];  Service: Endoscopy;  Laterality: N/A;  . FLEXIBLE SIGMOIDOSCOPY N/A 02/08/2017   Procedure: FLEXIBLE SIGMOIDOSCOPY;  Surgeon: Irene Shipper, MD;  Location: [facility] ENDOSCOPY;  Service: Endoscopy;  Laterality: N/A;  . ICD GENERATOR CHANGEOUT N/A 04/08/2016   Procedure: ICD Generator Changeout;  Surgeon: Evans Lance, MD;  Location: [place] CV LAB;  Service: Cardiovascular;  Laterality: N/A;  . ICD LEAD REMOVAL N/A 03/02/2017   Procedure: ICD LEAD REMOVAL;  Surgeon: Evans Lance, MD;  Location: [place];  Service: Cardiovascular;  Laterality: N/A;  . IMPLANTABLE CARDIOVERTER DEFIBRILLATOR (ICD) GENERATOR CHANGE N/A 05/20/2011   Procedure: ICD GENERATOR CHANGE;  Surgeon: Evans Lance, MD;  Medtronic secure dual-chamber ICD serial number [id number]   . KNEE ARTHROSCOPY     right; "just went in and scraped it"  . LEAD INSERTION N/A 03/02/2017   Procedure: LEAD INSERTION;  Surgeon: Evans Lance, MD;  Location: [place];  Service: Cardiovascular;  Laterality: N/A;  . MASS EXCISION Right 05/10/2013   Procedure: EXCISION MASS RIGHT THUMB;  Surgeon: Wynonia Sours, MD;  Location: [place];  Service: Orthopedics;  Laterality: Right;  . PROXIMAL INTERPHALANGEAL FUSION (PIP) Right 05/10/2013   Procedure: DEBRIDEMENT PROXIMAL INTERPHALANGEAL FUSION (PIP);  Surgeon: Wynonia Sours, MD;  Location: [place];  Service: Orthopedics;  Laterality: Right;  . TONSILLECTOMY             Home Medications    Prior to Admission medications   Medication Sig Start Date End Date Taking? Authorizing Provider  ADVAIR DISKUS 250-50 MCG/DOSE AEPB Inhale 1 puff into the lungs 2 (two) times daily.  11/13/14  Yes [provider]    calcium carbonate (TUMS - DOSED IN MG ELEMENTAL CALCIUM) 500 MG chewable tablet Chew 1 tablet by mouth daily as needed for heartburn.    Yes [provider]  digoxin (LANOXIN) 0.125 MG tablet Take 1 tablet (0.125 mg total) by mouth daily. 02/10/17  Yes Short, Noah Delaine, MD  fluticasone (FLONASE) 50 MCG/ACT nasal spray Place 2 sprays into both nostrils daily. Patient taking differently: Place 1 spray into both nostrils daily as needed for allergies.  01/04/17  Yes [name], MD  furosemide (LASIX) 40 MG tablet Take 1 tablet (40 mg total) by mouth daily. May take an extra tab daily as needed for swelling 01/04/17  Yes [name], MD  LINZESS 72 MCG capsule Take 72 mcg by mouth daily as needed (for constipation).  03/07/16  Yes [provider]  loratadine (CLARITIN) 10 MG tablet Take 10 mg by mouth daily as needed for allergies.    Yes [provider]  metoprolol succinate (TOPROL-XL) 25 MG 24 hr tablet Take 1 tablet (25 mg total) by mouth daily. 02/10/17  Yes Short, Noah Delaine, MD  nitroGLYCERIN (NITROSTAT) 0.4 MG SL tablet PLACE ONE UNDER TONGUE FOR CHEST PAIN. Patient taking differently: Place 0.4 mg under tongue as needed for chest pain 11/22/16  Yes [name], Peter M, MD  potassium chloride (K-DUR,KLOR-CON) 10 MEQ tablet Take 1 tablet (10  mEq total) by mouth daily. 01/09/13  Yes [name], Peter M, MD  ranitidine (ZANTAC) 150 MG tablet Take 1 tablet (150 mg total) 2 (two) times daily by mouth. 12/20/16  Yes [name], MD  RESTASIS 0.05 % ophthalmic emulsion INSTILL 1 DROP INTO BOTH EYES TWICE A DAY 11/17/16  Yes [name], MD  gabapentin (NEURONTIN) 100 MG capsule TAKE 1 CAPSULE (100MG ) TO 3 CAPSULES (300MG ) EACH NIGHT AS NEEDED FOR MUSCLE PAINS 10/05/16   [name], MD  polyethylene glycol (MIRALAX / GLYCOLAX) packet Take 17 g by mouth daily as needed for mild constipation. 02/10/17   Janece Canterbury, MD  traMADol Veatrice Bourbon) 50 MG tablet Take 100 mg by mouth  every 8 hours as needed for pain 03/04/17   Tommie Raymond, NP    Family History Family History  Problem Relation Age of Onset  . Tuberculosis Mother   . Tuberculosis Father   . Heart disease Brother   . Diabetes Sister   . Diabetes Brother   . Clotting disorder Brother     Social History Social History   Tobacco Use  . Smoking status: Former Smoker    Packs/day: 2.00    Years: 30.00    Pack years: 60.00    Types: Cigarettes    Last attempt to quit: 06/23/1976    Years since quitting: 40.7  . Smokeless tobacco: Never Used  Substance Use Topics  . Alcohol use: No  . Drug use: No     Allergies   Tamsulosin; Celebrex [celecoxib]; Dronedarone; Esomeprazole magnesium; Digoxin; Hydrocodone-acetaminophen; and Protonix [pantoprazole sodium]   Review of Systems Review of Systems  Constitutional: Negative for appetite change, chills and fever.  HENT: Negative for ear pain, rhinorrhea, sneezing and sore throat.   Eyes: Negative for photophobia and visual disturbance.  Respiratory: Positive for shortness of breath. Negative for cough, chest tightness and wheezing.   Cardiovascular: Negative for chest pain and palpitations.  Gastrointestinal: Positive for abdominal pain, constipation and nausea. Negative for blood in stool, diarrhea and vomiting.  Genitourinary: Negative for dysuria, hematuria and urgency.  Musculoskeletal: Negative for myalgias.  Skin: Negative for rash.  Neurological: Negative for dizziness, weakness and light-headedness.     Physical Exam Updated Vital Signs BP 123/76   Pulse (!) 101   Temp (!) 97.4 F (36.3 C) (Oral)   Resp 16   Ht 6' (1.829 m)   Wt 81.6 kg (180 lb)   SpO2 94%   BMI 24.41 kg/m   Physical Exam  Constitutional: He appears well-developed and well-nourished. No distress.  Nontoxic appearing and in no acute distress. Dry heaving.  HENT:  Head: Normocephalic and atraumatic.  Nose: Nose normal.  Eyes: Conjunctivae and EOM are  normal. Left eye exhibits no discharge. No scleral icterus.  Neck: Normal range of motion. Neck supple.  Cardiovascular: Normal rate, regular rhythm, normal heart sounds and intact distal pulses. Exam reveals no gallop and no friction rub.  No murmur heard. Pulmonary/Chest: Effort normal and breath sounds normal. No respiratory distress.  Abdominal: Soft. Bowel sounds are normal. He exhibits no distension. There is tenderness. There is no guarding.    Musculoskeletal: Normal range of motion. He exhibits no edema.  Neurological: He is alert. He exhibits normal muscle tone. Coordination normal.  Skin: Skin is warm and dry. No rash noted.  Psychiatric: He has a normal mood and affect.  Nursing note and vitals reviewed.    ED Treatments / Results  Labs (all labs ordered  are listed, but only abnormal results are displayed) Labs Reviewed  COMPREHENSIVE METABOLIC PANEL - Abnormal; Notable for the following components:      Result Value   CO2 21 (*)    Glucose, Bld 145 (*)    Creatinine, Ser 1.47 (*)    ALT 10 (*)    Total Bilirubin 1.3 (*)    GFR calc non Af Amer 43 (*)    GFR calc Af Amer 49 (*)    All other components within normal limits  CBC WITH DIFFERENTIAL/PLATELET - Abnormal; Notable for the following components:   RDW 16.3 (*)    Monocytes Absolute 2.2 (*)    All other components within normal limits  TROPONIN I - Abnormal; Notable for the following components:   Troponin I 0.03 (*)    All other components within normal limits  LIPASE, BLOOD  TROPONIN I  URINALYSIS, ROUTINE W REFLEX MICROSCOPIC    EKG  EKG Interpretation  Date/Time:  Thursday March 23 2017 06:43:21 EST Ventricular Rate:  85 PR Interval:    QRS Duration: 92 QT Interval:  352 QTC Calculation: 418 R Axis:   -23 Text Interpretation:  Atrial fibrillation with a competing junctional pacemaker Possible Anterior infarct , age undetermined Abnormal ECG No significant change was found Confirmed by  Shanon Rosser [phone]) on 03/23/2017 6:48:51 AM       Radiology Dg Abdomen Acute W/chest  Result Date: 03/23/2017 CLINICAL DATA:  Abdominal pain with nausea, vomiting, and constipation. EXAM: DG ABDOMEN ACUTE W/ 1V CHEST COMPARISON:  Chest x-ray dated March 03, 2017. CT abdomen pelvis dated February 06, 2017. Abdominal x-rays dated April 14, 2016. FINDINGS: Unchanged left chest wall AICD. Stable cardiomediastinal silhouette. Normal pulmonary vascularity. No focal consolidation, pleural effusion, or pneumothorax. No acute osseous abnormality. There is no evidence of dilated bowel loops or free intraperitoneal air. There are few air-fluid levels in the small bowel on the upright view. Air and stool are seen within the colon and rectum. Bilateral renal calculi, 8 mm on the left, 5 mm on the right. Degenerative changes of the lumbar spine and bilateral hip joints. IMPRESSION: 1. A few air-fluid levels within nondilated small bowel favor enteritis. No evidence of obstruction. 2. Bilateral renal calculi. 3.  No active cardiopulmonary disease. Electronically Signed   By: Titus Dubin M.D.   On: 03/23/2017 08:44    Procedures Procedures (including critical care time)  Medications Ordered in ED Medications - No data to display   Initial Impression / Assessment and Plan / ED Course  I have reviewed the triage vital signs and the nursing notes.  Pertinent labs & imaging results that were available during my care of the patient were reviewed by me and considered in my medical decision making (see chart for details).     Patient presents to ED for evaluation of 45-month history of epigastric abdominal pain, shortness of breath, nausea.  He also reports constipation for the past 3 days.  Denies any chest pain, back pain, urinary symptoms, URI symptoms, fever.  Physical exam patient is overall well-appearing.  He is satting at 96-97% on room air without difficulty.  He is afebrile with no use of  antipyretics.  He is not tachycardic or tachypneic.  He does have some epigastric tenderness to palpation but no distention, rebound tenderness.  CBC, CMP, lipase unremarkable.  Initial troponin was negative and 4-hour troponin was slightly elevated at 0.03.  Abdominal and chest x-ray shows possible findings related to enteritis with no  evidence of obstruction or cardiopulmonary disease.  EKG with tracing similar to previous.  I suspect that this could be the cause of his symptoms.  He continues to deny any chest pain, hemoptysis.  We will refer him to GI specialist for further evaluation if symptoms persist.  He states that he has a Linzess at home which he takes for his constipation as needed. Patient appears stable for discharge at this time. Strict return precautions given.  Patient discussed with and seen by Dr. Florina Ou.  Final Clinical Impressions(s) / ED Diagnoses   Final diagnoses:  Enteritis    ED Discharge Orders    None       Delia Heady, [place] 03/23/17 1221

## 2017-03-23 NOTE — ED Triage Notes (Signed)
Pt arrives from home with complaints of abdominal pain for the last few months, but tonight has gotten worse. Pt complains of N/V and constipation.

## 2017-03-23 NOTE — ED Notes (Signed)
[facility] called for the patient.

## 2017-03-23 NOTE — ED Notes (Signed)
RN notified of abnormal lab 

## 2017-03-23 NOTE — ED Notes (Signed)
Pt aware a urine sample is needed and has tried to urinate but unable to do so at this time.

## 2017-03-23 NOTE — Discharge Instructions (Signed)
Please read attached information regarding your condition. Continue your home medications as previously prescribed. Follow-up with your primary care provider for further evaluation. Return to ED for worsening symptoms, chest pain, increased shortness of breath, increased vomiting.

## 2017-03-23 NOTE — ED Notes (Signed)
Patient given water. Patient states he can not urinate at this time.

## 2017-03-23 NOTE — ED Provider Notes (Signed)
Medical screening examination/treatment/procedure(s) were conducted as a shared visit with non-physician practitioner(s) and myself.  I personally evaluated the patient during the encounter.   EKG Interpretation  Date/Time:  Thursday March 23 2017 06:43:21 EST Ventricular Rate:  85 PR Interval:    QRS Duration: 92 QT Interval:  352 QTC Calculation: 418 R Axis:   -23 Text Interpretation:  Atrial fibrillation with a competing junctional pacemaker Possible Anterior infarct , age undetermined Abnormal ECG No significant change was found Confirmed by Vaneta Hammontree [phone]) on 03/23/2017 6:48:51 AM      6:51 AM Abdomen soft with mild epigastric tenderness.  No masses palpated.     Wafa Martes, Jenny Reichmann, MD 03/23/17 [phone]

## 2017-03-24 ENCOUNTER — Other Ambulatory Visit: Payer: Self-pay

## 2017-03-24 ENCOUNTER — Encounter (HOSPITAL_COMMUNITY): Payer: Self-pay | Admitting: *Deleted

## 2017-03-24 ENCOUNTER — Other Ambulatory Visit: Payer: Self-pay | Admitting: *Deleted

## 2017-03-24 DIAGNOSIS — I10 Essential (primary) hypertension: Secondary | ICD-10-CM | POA: Insufficient documentation

## 2017-03-24 DIAGNOSIS — W010XXA Fall on same level from slipping, tripping and stumbling without subsequent striking against object, initial encounter: Secondary | ICD-10-CM | POA: Insufficient documentation

## 2017-03-24 DIAGNOSIS — I130 Hypertensive heart and chronic kidney disease with heart failure and stage 1 through stage 4 chronic kidney disease, or unspecified chronic kidney disease: Secondary | ICD-10-CM | POA: Diagnosis not present

## 2017-03-24 DIAGNOSIS — Y939 Activity, unspecified: Secondary | ICD-10-CM | POA: Insufficient documentation

## 2017-03-24 DIAGNOSIS — Z951 Presence of aortocoronary bypass graft: Secondary | ICD-10-CM | POA: Insufficient documentation

## 2017-03-24 DIAGNOSIS — I509 Heart failure, unspecified: Secondary | ICD-10-CM

## 2017-03-24 DIAGNOSIS — E119 Type 2 diabetes mellitus without complications: Secondary | ICD-10-CM | POA: Insufficient documentation

## 2017-03-24 DIAGNOSIS — S3991XA Unspecified injury of abdomen, initial encounter: Secondary | ICD-10-CM | POA: Diagnosis not present

## 2017-03-24 DIAGNOSIS — J45909 Unspecified asthma, uncomplicated: Secondary | ICD-10-CM

## 2017-03-24 DIAGNOSIS — R1030 Lower abdominal pain, unspecified: Secondary | ICD-10-CM | POA: Diagnosis not present

## 2017-03-24 DIAGNOSIS — R51 Headache: Secondary | ICD-10-CM | POA: Diagnosis not present

## 2017-03-24 DIAGNOSIS — S0990XA Unspecified injury of head, initial encounter: Secondary | ICD-10-CM | POA: Diagnosis not present

## 2017-03-24 DIAGNOSIS — R0789 Other chest pain: Secondary | ICD-10-CM | POA: Diagnosis not present

## 2017-03-24 DIAGNOSIS — I2699 Other pulmonary embolism without acute cor pulmonale: Secondary | ICD-10-CM | POA: Diagnosis not present

## 2017-03-24 DIAGNOSIS — R109 Unspecified abdominal pain: Secondary | ICD-10-CM | POA: Diagnosis not present

## 2017-03-24 DIAGNOSIS — R079 Chest pain, unspecified: Secondary | ICD-10-CM | POA: Diagnosis not present

## 2017-03-24 DIAGNOSIS — I2510 Atherosclerotic heart disease of native coronary artery without angina pectoris: Secondary | ICD-10-CM | POA: Diagnosis not present

## 2017-03-24 DIAGNOSIS — Z79899 Other long term (current) drug therapy: Secondary | ICD-10-CM | POA: Insufficient documentation

## 2017-03-24 DIAGNOSIS — E785 Hyperlipidemia, unspecified: Secondary | ICD-10-CM | POA: Diagnosis not present

## 2017-03-24 DIAGNOSIS — Z87891 Personal history of nicotine dependence: Secondary | ICD-10-CM

## 2017-03-24 DIAGNOSIS — Y999 Unspecified external cause status: Secondary | ICD-10-CM | POA: Insufficient documentation

## 2017-03-24 DIAGNOSIS — S20219A Contusion of unspecified front wall of thorax, initial encounter: Secondary | ICD-10-CM | POA: Insufficient documentation

## 2017-03-24 DIAGNOSIS — Y929 Unspecified place or not applicable: Secondary | ICD-10-CM | POA: Insufficient documentation

## 2017-03-24 DIAGNOSIS — I2782 Chronic pulmonary embolism: Secondary | ICD-10-CM | POA: Diagnosis not present

## 2017-03-24 DIAGNOSIS — I472 Ventricular tachycardia: Secondary | ICD-10-CM | POA: Diagnosis not present

## 2017-03-24 DIAGNOSIS — I5022 Chronic systolic (congestive) heart failure: Secondary | ICD-10-CM | POA: Diagnosis not present

## 2017-03-24 DIAGNOSIS — S299XXA Unspecified injury of thorax, initial encounter: Secondary | ICD-10-CM | POA: Diagnosis not present

## 2017-03-24 DIAGNOSIS — R1013 Epigastric pain: Secondary | ICD-10-CM | POA: Diagnosis not present

## 2017-03-24 LAB — COMPREHENSIVE METABOLIC PANEL
ALK PHOS: 61 U/L (ref 38–126)
ALT: 9 U/L — AB (ref 17–63)
AST: 23 U/L (ref 15–41)
Albumin: 4 g/dL (ref 3.5–5.0)
Anion gap: 15 (ref 5–15)
BUN: 22 mg/dL — AB (ref 6–20)
CO2: 20 mmol/L — AB (ref 22–32)
CREATININE: 1.44 mg/dL — AB (ref 0.61–1.24)
Calcium: 9.1 mg/dL (ref 8.9–10.3)
Chloride: 100 mmol/L — ABNORMAL LOW (ref 101–111)
GFR calc Af Amer: 51 mL/min — ABNORMAL LOW (ref >60)
GFR, EST NON AFRICAN AMERICAN: 44 mL/min — AB (ref >60)
Glucose, Bld: 117 mg/dL — ABNORMAL HIGH (ref 65–99)
Potassium: 3.7 mmol/L (ref 3.5–5.1)
Sodium: 135 mmol/L (ref 135–145)
Total Bilirubin: 1.9 mg/dL — ABNORMAL HIGH (ref 0.3–1.2)
Total Protein: 7 g/dL (ref 6.5–8.1)

## 2017-03-24 LAB — CBC
HCT: 42 % (ref 39.0–52.0)
Hemoglobin: 14.1 g/dL (ref 13.0–17.0)
MCH: 30.5 pg (ref 26.0–34.0)
MCHC: 33.6 g/dL (ref 30.0–36.0)
MCV: 90.9 fL (ref 78.0–100.0)
PLATELETS: 245 10*3/uL (ref 150–400)
RBC: 4.62 MIL/uL (ref 4.22–5.81)
RDW: 16.1 % — AB (ref 11.5–15.5)
WBC: 7.3 10*3/uL (ref 4.0–10.5)

## 2017-03-24 LAB — LIPASE, BLOOD: Lipase: 26 U/L (ref 11–51)

## 2017-03-24 NOTE — Patient Outreach (Signed)
[facility]) Care Management  03/24/2017  [name] 1932-02-16 [id number]    Transition of care (d/c SNF 2/21 initial)  RN attempted the initial outreach call to pt however only able to leave a HIPAA approved voice message pending a requested call back. Will further engage on pt's needs at that time.  Raina Mina, RN Care Management Coordinator [place] Office [phone]

## 2017-03-24 NOTE — Telephone Encounter (Signed)
Call in #[phone] with one rf  

## 2017-03-24 NOTE — ED Triage Notes (Signed)
Pt has hx of constipation. Arrived by [facility] for left side abd pain. Was seen at [facility] last night for same. Has mild nausea. Last bm was today.

## 2017-03-25 ENCOUNTER — Emergency Department (HOSPITAL_COMMUNITY): Payer: Medicare Other

## 2017-03-25 ENCOUNTER — Emergency Department (HOSPITAL_COMMUNITY)
Admission: EM | Admit: 2017-03-25 | Discharge: 2017-03-25 | Disposition: A | Discharge status: Home / Self Care | Payer: Medicare Other | Source: Home / Self Care | Attending: Emergency Medicine | Admitting: Emergency Medicine

## 2017-03-25 DIAGNOSIS — R0789 Other chest pain: Secondary | ICD-10-CM | POA: Diagnosis not present

## 2017-03-25 DIAGNOSIS — S20219A Contusion of unspecified front wall of thorax, initial encounter: Secondary | ICD-10-CM

## 2017-03-25 DIAGNOSIS — W19XXXA Unspecified fall, initial encounter: Secondary | ICD-10-CM

## 2017-03-25 DIAGNOSIS — S299XXA Unspecified injury of thorax, initial encounter: Secondary | ICD-10-CM | POA: Diagnosis not present

## 2017-03-25 NOTE — ED Provider Notes (Signed)
[facility] EMERGENCY DEPARTMENT Provider Note   CSN: [id number] Arrival date & time: 03/24/17  1508     History   Chief Complaint Chief Complaint  Patient presents with  . Abdominal Pain    HPI [name] is a 84 y.o. male.  Patient is an 84 year old male with past medical history of atrial fibrillation, AICD placement, coronary artery disease, diabetes.  He presents today for evaluation of pain in his upper abdomen and chest that began after a fall.  This fall occurred yesterday evening while walking down the hallway.  He states he lost his balance and fell, landing on top of his cane.  The cane struck him in the chest.  He also reports hitting his head with no loss of consciousness.  He reports headache yesterday, however this has since resolved.  He takes no blood thinners.   The history is provided by the patient.  Abdominal Pain   This is a new problem. The current episode started yesterday. The problem occurs constantly. The problem has not changed since onset.The pain is located in the epigastric region. The quality of the pain is sharp. The pain is moderate. Pertinent negatives include fever, hematochezia, vomiting and constipation. Nothing relieves the symptoms.    Past Medical History:  Diagnosis Date  . Abnormal thyroid scan    Abnormal thyroid imaging studies from 11/09/2010, status post ultrasound guided fine needle aspiration of the dominant left inferior thyroid nodule on 12/15/2010. Cytology report showed rare follicular epithelial cells and hemosiderin laden macrophages.  Marland Kitchen AICD (automatic cardioverter/defibrillator) present    a. fx lead; a. s/p lead extraction 03/02/17  . Arthritis    "all over"  . Asthma   . Atrial fibrillation ([place])    on chronic Coumadin; stopped July 2013 due to subdural hematomas  . CHF (congestive heart failure) (HCC)    EF 35-40% s/p most recent ICD generator change-out with Medtronic dual-chamber ICD 05/20/11  with explantation of previous abdominally-implanted device  . Coronary artery disease    s/p CABG 1983 and PCI/stent 2004.   . Diabetes mellitus    diet controlled  . Dyslipidemia   . Erythrocytosis   . GERD (gastroesophageal reflux disease)   . Hypertension   . Ischemic cardiomyopathy    WITH CHF  . Monocytosis 04/17/2013  . Myocardial infarction ([place]) 1983; ~ 1990  . Pneumonia August 2013  . Subdural hematoma [facility]) July 2013   Anticoagulation stopped.   . SunDown syndrome   . VT (ventricular tachycardia) [facility])     Patient Active Problem List   Diagnosis Date Noted  . Acute confusion 03/04/2017  . Failure of implantable cardioverter-defibrillator (ICD) lead 03/02/2017  . Rectal ulcer   . Rectal bleeding   . Proctitis 02/07/2017  . Abdominal pain 02/06/2017  . Acute radiation proctitis 02/06/2017  . Failure of implantable cardioverter-defibrillator lead 11/10/2016  . ICD (implantable cardioverter-defibrillator) in place 04/08/2016  . Gastroesophageal reflux disease without esophagitis 12/31/2013  . Hypotension 09/23/2011  . HCAP (healthcare-associated pneumonia) 09/23/2011  . SIRS (systemic inflammatory response syndrome) ([place]) 09/23/2011  . Thrombocytopenia ([place]) 09/23/2011  . History of subdural hemorrhage 09/08/2011  . Ataxia 08/17/2011  . Dehydration 08/17/2011  . Atrial fibrillation with controlled ventricular response ([place]) 08/13/2011  . Colon polyp 07/07/2011  . Angiodysplasia of colon 07/07/2011  . GERD (gastroesophageal reflux disease) 05/31/2011  . Anemia, iron deficiency 12/29/2010  . Chronic anticoagulation discontinued July 2013 after developed subdural hematoma 09/22/2010  . Bradycardia 09/22/2010  .  CAD (coronary artery disease) 06/25/2010  . ventricular tachycardia 12/30/2009  . Chronic atrial fibrillation ([place]) 12/30/2009  . Chronic systolic heart failure- EF 35-40% 12/30/2009  . Automatic implantable cardioverter-defibrillator in situ 12/30/2009     Past Surgical History:  Procedure Laterality Date  . CHOLECYSTECTOMY    . COLONOSCOPY  07/07/2011   Procedure: COLONOSCOPY;  Surgeon: Jerene Bears, MD;  Location: [facility] ENDOSCOPY;  Service: Gastroenterology;  Laterality: N/A;  . CORONARY ANGIOPLASTY WITH STENT PLACEMENT  2004   Tandem Cypher stents LAD  . CORONARY ARTERY BYPASS GRAFT  1983   SVG-mLAD  . ESOPHAGOGASTRODUODENOSCOPY  02/11/2011   Procedure: ESOPHAGOGASTRODUODENOSCOPY (EGD);  Surgeon: Beryle Beams, MD;  Location: [facility];  Service: Endoscopy;  Laterality: N/A;  . FLEXIBLE SIGMOIDOSCOPY N/A 02/08/2017   Procedure: FLEXIBLE SIGMOIDOSCOPY;  Surgeon: Irene Shipper, MD;  Location: [facility] ENDOS[facility];  Service: Endoscopy;  Laterality: N/A;  . ICD GENERATOR CHANGEOUT N/A 04/08/2016   Procedure: ICD Generator Changeout;  Surgeon: Evans Lance, MD;  Location: [facility] CV LAB;  Service: Cardiovascular;  Laterality: N/A;  . ICD LEAD REMOVAL N/A 03/02/2017   Procedure: ICD LEAD REMOVAL;  Surgeon: Evans Lance, MD;  Location: [facility];  Service: Cardiovascular;  Laterality: N/A;  . IMPLANTABLE CARDIOVERTER DEFIBRILLATOR (ICD) GENERATOR CHANGE N/A 05/20/2011   Procedure: ICD GENERATOR CHANGE;  Surgeon: Evans Lance, MD;  Medtronic secure dual-chamber ICD serial number [id number]   . KNEE ARTHROSCOPY     right; "just went in and scraped it"  . LEAD INSERTION N/A 03/02/2017   Procedure: LEAD INSERTION;  Surgeon: Evans Lance, MD;  Location: [facility] [place];  Service: Cardiovascular;  Laterality: N/A;  . MASS EXCISION Right 05/10/2013   Procedure: EXCISION MASS RIGHT THUMB;  Surgeon: Wynonia Sours, MD;  Location: [facility];  Service: Orthopedics;  Laterality: Right;  . PROXIMAL INTERPHALANGEAL FUSION (PIP) Right 05/10/2013   Procedure: DEBRIDEMENT PROXIMAL INTERPHALANGEAL FUSION (PIP);  Surgeon: Wynonia Sours, MD;  Location: [facility];  Service: Orthopedics;  Laterality: Right;  . TONSILLECTOMY             Home  Medications    Prior to Admission medications   Medication Sig Start Date End Date Taking? Authorizing Provider  ADVAIR DISKUS 250-50 MCG/DOSE AEPB Inhale 1 puff into the lungs 2 (two) times daily.  11/13/14   [provider]  calcium carbonate (TUMS - DOSED IN MG ELEMENTAL CALCIUM) 500 MG chewable tablet Chew 1 tablet by mouth daily as needed for heartburn.     [provider]  digoxin (LANOXIN) 0.125 MG tablet Take 1 tablet (0.125 mg total) by mouth daily. 02/10/17   Janece Canterbury, MD  fluticasone (FLONASE) 50 MCG/ACT nasal spray Place 2 sprays into both nostrils daily. Patient taking differently: Place 1 spray into both nostrils daily as needed for allergies.  01/04/17   [name], MD  furosemide (LASIX) 40 MG tablet Take 1 tablet (40 mg total) by mouth daily. May take an extra tab daily as needed for swelling 01/04/17   [name], MD  gabapentin (NEURONTIN) 100 MG capsule TAKE 1 CAPSULE (100MG ) TO 3 CAPSULES (300MG ) EACH NIGHT AS NEEDED FOR MUSCLE PAINS 10/05/16   [name], MD  LINZESS 72 MCG capsule Take 72 mcg by mouth daily as needed (for constipation).  03/07/16   [provider]  loratadine (CLARITIN) 10 MG tablet Take 10 mg by mouth daily as needed for allergies.  [provider]  metoprolol succinate (TOPROL-XL) 25 MG 24 hr tablet Take 1 tablet (25 mg total) by mouth daily. 02/10/17   Janece Canterbury, MD  nitroGLYCERIN (NITROSTAT) 0.4 MG SL tablet PLACE ONE UNDER TONGUE FOR CHEST PAIN. Patient taking differently: Place 0.4 mg under tongue as needed for chest pain 11/22/16   [name], Peter M, MD  polyethylene glycol [facility] / Floria Raveling) packet Take 17 g by mouth daily as needed for mild constipation. 02/10/17   Janece Canterbury, MD  potassium chloride (K-DUR,KLOR-CON) 10 MEQ tablet Take 1 tablet (10 mEq total) by mouth daily. 01/09/13   [name], Peter M, MD  ranitidine (ZANTAC) 150 MG tablet Take 1 tablet (150 mg total) 2 (two) times daily  by mouth. 12/20/16   [name], MD  RESTASIS 0.05 % ophthalmic emulsion INSTILL 1 DROP INTO BOTH EYES TWICE A DAY 11/17/16   [name], MD  traMADol (ULTRAM) 50 MG tablet TAKE 2 TABLETS BY MOUTH EVERY 8 HOURS AS NEEDED FOR PAIN 03/24/17   [name], MD    Family History Family History  Problem Relation Age of Onset  . Tuberculosis Mother   . Tuberculosis Father   . Heart disease Brother   . Diabetes Sister   . Diabetes Brother   . Clotting disorder Brother     Social History Social History   Tobacco Use  . Smoking status: Former Smoker    Packs/day: 2.00    Years: 30.00    Pack years: 60.00    Types: Cigarettes    Last attempt to quit: 06/23/1976    Years since quitting: 40.7  . Smokeless tobacco: Never Used  Substance Use Topics  . Alcohol use: No  . Drug use: No     Allergies   Tamsulosin; Celebrex [celecoxib]; Dronedarone; Esomeprazole magnesium; Digoxin; Hydrocodone-acetaminophen; and Protonix [pantoprazole sodium]   Review of Systems Review of Systems  Constitutional: Negative for fever.  Gastrointestinal: Positive for abdominal pain. Negative for constipation, hematochezia and vomiting.  All other systems reviewed and are negative.    Physical Exam Updated Vital Signs BP (!) 142/91 (BP Location: Right Arm)   Pulse 68   Temp 98.8 F (37.1 C) (Oral)   Resp 18   SpO2 97%   Physical Exam  Constitutional: He is oriented to person, place, and time. He appears well-developed and well-nourished. No distress.  HENT:  Head: Normocephalic and atraumatic.  Mouth/Throat: Oropharynx is clear and moist.  Neck: Normal range of motion. Neck supple.  Cardiovascular: Normal rate and regular rhythm. Exam reveals no friction rub.  No murmur heard. Pulmonary/Chest: Effort normal and breath sounds normal. No respiratory distress. He has no wheezes. He has no rales.  Abdominal: Soft. Bowel sounds are normal. He exhibits no distension. There is no tenderness.   Musculoskeletal: Normal range of motion. He exhibits no edema.  Neurological: He is alert and oriented to person, place, and time. Coordination normal.  Skin: Skin is warm and dry. He is not diaphoretic.  Nursing note and vitals reviewed.    ED Treatments / Results  Labs (all labs ordered are listed, but only abnormal results are displayed) Labs Reviewed  COMPREHENSIVE METABOLIC PANEL - Abnormal; Notable for the following components:      Result Value   Chloride 100 (*)    CO2 20 (*)    Glucose, Bld 117 (*)    BUN 22 (*)    Creatinine, Ser 1.44 (*)    ALT 9 (*)  Total Bilirubin 1.9 (*)    GFR calc non Af Amer 44 (*)    GFR calc Af Amer 51 (*)    All other components within normal limits  CBC - Abnormal; Notable for the following components:   RDW 16.1 (*)    All other components within normal limits  LIPASE, BLOOD  URINALYSIS, ROUTINE W REFLEX MICROSCOPIC    EKG  EKG Interpretation None       Radiology Dg Abdomen Acute W/chest  Result Date: 03/23/2017 CLINICAL DATA:  Abdominal pain with nausea, vomiting, and constipation. EXAM: DG ABDOMEN ACUTE W/ 1V CHEST COMPARISON:  Chest x-ray dated March 03, 2017. CT abdomen pelvis dated February 06, 2017. Abdominal x-rays dated April 14, 2016. FINDINGS: Unchanged left chest wall AICD. Stable cardiomediastinal silhouette. Normal pulmonary vascularity. No focal consolidation, pleural effusion, or pneumothorax. No acute osseous abnormality. There is no evidence of dilated bowel loops or free intraperitoneal air. There are few air-fluid levels in the small bowel on the upright view. Air and stool are seen within the colon and rectum. Bilateral renal calculi, 8 mm on the left, 5 mm on the right. Degenerative changes of the lumbar spine and bilateral hip joints. IMPRESSION: 1. A few air-fluid levels within nondilated small bowel favor enteritis. No evidence of obstruction. 2. Bilateral renal calculi. 3.  No active cardiopulmonary disease.  Electronically Signed   By: Titus Dubin M.D.   On: 03/23/2017 08:44    Procedures Procedures (including critical care time)  Medications Ordered in ED Medications - No data to display   Initial Impression / Assessment and Plan / ED Course  I have reviewed the triage vital signs and the nursing notes.  Pertinent labs & imaging results that were available during my care of the patient were reviewed by me and considered in my medical decision making (see chart for details).  Patient presents after tripping and falling on his cane 2 days ago.  He is having pain in the epigastric region.  His laboratory studies are unremarkable and his chest x-ray is negative.  I doubt any significant internal injuries.  His vital signs are stable and his hemoglobin is unchanged.  At this point, I see no indication for further workup.  He will be discharged, to follow-up as needed for any problems.  Final Clinical Impressions(s) / ED Diagnoses   Final diagnoses:  None    ED Discharge Orders    None       Veryl Speak, MD 03/25/17 [phone]

## 2017-03-25 NOTE — ED Notes (Signed)
Patient transported to CT 

## 2017-03-25 NOTE — Discharge Instructions (Signed)
Ibuprofen 600 mg every 6 hours as needed for pain.  Return to the emergency department if symptoms significantly worsen or change.

## 2017-03-26 ENCOUNTER — Emergency Department (HOSPITAL_COMMUNITY): Payer: Medicare Other

## 2017-03-26 ENCOUNTER — Encounter (HOSPITAL_COMMUNITY): Payer: Self-pay | Admitting: Nurse Practitioner

## 2017-03-26 ENCOUNTER — Inpatient Hospital Stay (HOSPITAL_COMMUNITY)
Admission: EM | Admit: 2017-03-26 | Discharge: 2017-03-29 | DRG: 176 | Disposition: A | Discharge status: Home Health | Payer: Medicare Other | Attending: Nephrology | Admitting: Nephrology

## 2017-03-26 DIAGNOSIS — I2510 Atherosclerotic heart disease of native coronary artery without angina pectoris: Secondary | ICD-10-CM | POA: Diagnosis present

## 2017-03-26 DIAGNOSIS — K219 Gastro-esophageal reflux disease without esophagitis: Secondary | ICD-10-CM | POA: Diagnosis present

## 2017-03-26 DIAGNOSIS — Z888 Allergy status to other drugs, medicaments and biological substances status: Secondary | ICD-10-CM

## 2017-03-26 DIAGNOSIS — R451 Restlessness and agitation: Secondary | ICD-10-CM | POA: Diagnosis not present

## 2017-03-26 DIAGNOSIS — Z8249 Family history of ischemic heart disease and other diseases of the circulatory system: Secondary | ICD-10-CM

## 2017-03-26 DIAGNOSIS — Z87820 Personal history of traumatic brain injury: Secondary | ICD-10-CM

## 2017-03-26 DIAGNOSIS — G4489 Other headache syndrome: Secondary | ICD-10-CM | POA: Diagnosis not present

## 2017-03-26 DIAGNOSIS — Z951 Presence of aortocoronary bypass graft: Secondary | ICD-10-CM

## 2017-03-26 DIAGNOSIS — Z95810 Presence of automatic (implantable) cardiac defibrillator: Secondary | ICD-10-CM | POA: Diagnosis present

## 2017-03-26 DIAGNOSIS — W010XXA Fall on same level from slipping, tripping and stumbling without subsequent striking against object, initial encounter: Secondary | ICD-10-CM | POA: Diagnosis present

## 2017-03-26 DIAGNOSIS — I2699 Other pulmonary embolism without acute cor pulmonale: Secondary | ICD-10-CM | POA: Diagnosis not present

## 2017-03-26 DIAGNOSIS — I255 Ischemic cardiomyopathy: Secondary | ICD-10-CM | POA: Diagnosis present

## 2017-03-26 DIAGNOSIS — S3991XA Unspecified injury of abdomen, initial encounter: Secondary | ICD-10-CM | POA: Diagnosis not present

## 2017-03-26 DIAGNOSIS — E1122 Type 2 diabetes mellitus with diabetic chronic kidney disease: Secondary | ICD-10-CM | POA: Diagnosis present

## 2017-03-26 DIAGNOSIS — I252 Old myocardial infarction: Secondary | ICD-10-CM

## 2017-03-26 DIAGNOSIS — R030 Elevated blood-pressure reading, without diagnosis of hypertension: Secondary | ICD-10-CM | POA: Diagnosis not present

## 2017-03-26 DIAGNOSIS — I5022 Chronic systolic (congestive) heart failure: Secondary | ICD-10-CM | POA: Diagnosis present

## 2017-03-26 DIAGNOSIS — R410 Disorientation, unspecified: Secondary | ICD-10-CM | POA: Diagnosis not present

## 2017-03-26 DIAGNOSIS — S0990XA Unspecified injury of head, initial encounter: Secondary | ICD-10-CM | POA: Diagnosis not present

## 2017-03-26 DIAGNOSIS — R0789 Other chest pain: Secondary | ICD-10-CM | POA: Diagnosis not present

## 2017-03-26 DIAGNOSIS — Z008 Encounter for other general examination: Secondary | ICD-10-CM

## 2017-03-26 DIAGNOSIS — Z885 Allergy status to narcotic agent status: Secondary | ICD-10-CM

## 2017-03-26 DIAGNOSIS — I472 Ventricular tachycardia: Secondary | ICD-10-CM | POA: Diagnosis present

## 2017-03-26 DIAGNOSIS — Z955 Presence of coronary angioplasty implant and graft: Secondary | ICD-10-CM

## 2017-03-26 DIAGNOSIS — Z87891 Personal history of nicotine dependence: Secondary | ICD-10-CM

## 2017-03-26 DIAGNOSIS — R109 Unspecified abdominal pain: Secondary | ICD-10-CM | POA: Diagnosis not present

## 2017-03-26 DIAGNOSIS — R51 Headache: Secondary | ICD-10-CM | POA: Diagnosis not present

## 2017-03-26 DIAGNOSIS — R531 Weakness: Secondary | ICD-10-CM | POA: Diagnosis present

## 2017-03-26 DIAGNOSIS — Z781 Physical restraint status: Secondary | ICD-10-CM

## 2017-03-26 DIAGNOSIS — I482 Chronic atrial fibrillation: Secondary | ICD-10-CM | POA: Diagnosis present

## 2017-03-26 DIAGNOSIS — N183 Chronic kidney disease, stage 3 (moderate): Secondary | ICD-10-CM | POA: Diagnosis present

## 2017-03-26 DIAGNOSIS — Z7901 Long term (current) use of anticoagulants: Secondary | ICD-10-CM

## 2017-03-26 DIAGNOSIS — I130 Hypertensive heart and chronic kidney disease with heart failure and stage 1 through stage 4 chronic kidney disease, or unspecified chronic kidney disease: Secondary | ICD-10-CM | POA: Diagnosis present

## 2017-03-26 DIAGNOSIS — S299XXA Unspecified injury of thorax, initial encounter: Secondary | ICD-10-CM | POA: Diagnosis not present

## 2017-03-26 DIAGNOSIS — J45909 Unspecified asthma, uncomplicated: Secondary | ICD-10-CM | POA: Diagnosis present

## 2017-03-26 DIAGNOSIS — I2782 Chronic pulmonary embolism: Secondary | ICD-10-CM | POA: Diagnosis present

## 2017-03-26 DIAGNOSIS — E785 Hyperlipidemia, unspecified: Secondary | ICD-10-CM | POA: Diagnosis present

## 2017-03-26 LAB — I-STAT CG4 LACTIC ACID, ED
LACTIC ACID, VENOUS: 1.18 mmol/L (ref 0.5–1.9)
Lactic Acid, Venous: 1.09 mmol/L (ref 0.5–1.9)

## 2017-03-26 LAB — COMPREHENSIVE METABOLIC PANEL
ALK PHOS: 62 U/L (ref 38–126)
ALT: 10 U/L — ABNORMAL LOW (ref 17–63)
ANION GAP: 16 — AB (ref 5–15)
AST: 23 U/L (ref 15–41)
Albumin: 4.2 g/dL (ref 3.5–5.0)
BILIRUBIN TOTAL: 1.7 mg/dL — AB (ref 0.3–1.2)
BUN: 24 mg/dL — ABNORMAL HIGH (ref 6–20)
CALCIUM: 9.5 mg/dL (ref 8.9–10.3)
CO2: 24 mmol/L (ref 22–32)
CREATININE: 1.32 mg/dL — AB (ref 0.61–1.24)
Chloride: 99 mmol/L — ABNORMAL LOW (ref 101–111)
GFR, EST AFRICAN AMERICAN: 56 mL/min — AB (ref >60)
GFR, EST NON AFRICAN AMERICAN: 49 mL/min — AB (ref >60)
Glucose, Bld: 101 mg/dL — ABNORMAL HIGH (ref 65–99)
Potassium: 3.8 mmol/L (ref 3.5–5.1)
Sodium: 139 mmol/L (ref 135–145)
TOTAL PROTEIN: 7 g/dL (ref 6.5–8.1)

## 2017-03-26 LAB — PROTIME-INR
INR: 1.48
PROTHROMBIN TIME: 17.8 s — AB (ref 11.4–15.2)

## 2017-03-26 LAB — I-STAT TROPONIN, ED: TROPONIN I, POC: 0.01 ng/mL (ref 0.00–0.08)

## 2017-03-26 LAB — LIPASE, BLOOD: LIPASE: 28 U/L (ref 11–51)

## 2017-03-26 LAB — DIGOXIN LEVEL: DIGOXIN LVL: 0.4 ng/mL — AB (ref 0.8–2.0)

## 2017-03-26 MED ORDER — SODIUM CHLORIDE 0.9 % IJ SOLN
INTRAMUSCULAR | Status: AC
  Filled 2017-03-26: qty 50

## 2017-03-26 MED ORDER — IOPAMIDOL (ISOVUE-300) INJECTION 61%
INTRAVENOUS | Status: AC
  Administered 2017-03-26: 75 mL
  Filled 2017-03-26: qty 75

## 2017-03-26 MED ORDER — IOPAMIDOL (ISOVUE-300) INJECTION 61%
INTRAVENOUS | Status: AC
  Filled 2017-03-26: qty 75

## 2017-03-26 NOTE — ED Notes (Signed)
Bed: WA18 Expected date:  Expected time:  Means of arrival:  Comments: 

## 2017-03-26 NOTE — ED Notes (Signed)
Unsuccessful lab draw x2. 

## 2017-03-26 NOTE — ED Triage Notes (Signed)
Pt is c/o generalized soreness secondary to a fall. Collaborates chart review for being evaluated yesterday for this fall adding that he was advised to return "if he felt worse."

## 2017-03-26 NOTE — ED Provider Notes (Signed)
[facility] DEPT Provider Note   CSN: [id number] Arrival date & time: 03/26/17  1523     History   Chief Complaint Chief Complaint  Patient presents with  . Fall    Last Week  . Soreness    HPI [name] is a 84 y.o. male.  The history is provided by the patient and medical records. No language interpreter was used.  Chest Pain   This is a new problem. The current episode started 2 days ago. The problem occurs constantly. The problem has not changed since onset.Pain location: lower. The pain is at a severity of 8/10. The pain is severe. The quality of the pain is described as sharp and pressure-like. The pain radiates to the epigastrium. Duration of episode(s) is 2 days. Associated symptoms include abdominal pain, headaches, malaise/fatigue, nausea and shortness of breath. Pertinent negatives include no back pain, no cough, no diaphoresis, no fever, no irregular heartbeat, no near-syncope, no numbness, no palpitations, no sputum production, no syncope, no vomiting and no weakness. He has tried nothing for the symptoms. The treatment provided no relief.  His past medical history is significant for arrhythmia and CHF.    Past Medical History:  Diagnosis Date  . Abnormal thyroid scan    Abnormal thyroid imaging studies from 11/09/2010, status post ultrasound guided fine needle aspiration of the dominant left inferior thyroid nodule on 12/15/2010. Cytology report showed rare follicular epithelial cells and hemosiderin laden macrophages.  Marland Kitchen AICD (automatic cardioverter/defibrillator) present    a. fx lead; a. s/p lead extraction 03/02/17  . Arthritis    "all over"  . Asthma   . Atrial fibrillation ([place])    on chronic Coumadin; stopped July 2013 due to subdural hematomas  . CHF (congestive heart failure) (HCC)    EF 35-40% s/p most recent ICD generator change-out with Medtronic dual-chamber ICD 05/20/11 with explantation of previous  abdominally-implanted device  . Coronary artery disease    s/p CABG 1983 and PCI/stent 2004.   . Diabetes mellitus    diet controlled  . Dyslipidemia   . Erythrocytosis   . GERD (gastroesophageal reflux disease)   . Hypertension   . Ischemic cardiomyopathy    WITH CHF  . Monocytosis 04/17/2013  . Myocardial infarction ([place]) 1983; ~ 1990  . Pneumonia August 2013  . Subdural hematoma [facility]) July 2013   Anticoagulation stopped.   . SunDown syndrome   . VT (ventricular tachycardia) [facility])     Patient Active Problem List   Diagnosis Date Noted  . Acute confusion 03/04/2017  . Failure of implantable cardioverter-defibrillator (ICD) lead 03/02/2017  . Rectal ulcer   . Rectal bleeding   . Proctitis 02/07/2017  . Abdominal pain 02/06/2017  . Acute radiation proctitis 02/06/2017  . Failure of implantable cardioverter-defibrillator lead 11/10/2016  . ICD (implantable cardioverter-defibrillator) in place 04/08/2016  . Gastroesophageal reflux disease without esophagitis 12/31/2013  . Hypotension 09/23/2011  . HCAP (healthcare-associated pneumonia) 09/23/2011  . SIRS (systemic inflammatory response syndrome) ([place]) 09/23/2011  . Thrombocytopenia ([place]) 09/23/2011  . History of subdural hemorrhage 09/08/2011  . Ataxia 08/17/2011  . Dehydration 08/17/2011  . Atrial fibrillation with controlled ventricular response ([place]) 08/13/2011  . Colon polyp 07/07/2011  . Angiodysplasia of colon 07/07/2011  . GERD (gastroesophageal reflux disease) 05/31/2011  . Anemia, iron deficiency 12/29/2010  . Chronic anticoagulation discontinued July 2013 after developed subdural hematoma 09/22/2010  . Bradycardia 09/22/2010  . CAD (coronary artery disease) 06/25/2010  . ventricular tachycardia 12/30/2009  .  Chronic atrial fibrillation ([place]) 12/30/2009  . Chronic systolic heart failure- EF 35-40% 12/30/2009  . Automatic implantable cardioverter-defibrillator in situ 12/30/2009    Past Surgical History:    Procedure Laterality Date  . CHOLECYSTECTOMY    . COLONOSCOPY  07/07/2011   Procedure: COLONOSCOPY;  Surgeon: Jerene Bears, MD;  Location: [facility] ENDOSCOPY;  Service: Gastroenterology;  Laterality: N/A;  . CORONARY ANGIOPLASTY WITH STENT PLACEMENT  2004   Tandem Cypher stents LAD  . CORONARY ARTERY BYPASS GRAFT  1983   SVG-mLAD  . ESOPHAGOGASTRODUODENOSCOPY  02/11/2011   Procedure: ESOPHAGOGASTRODUODENOSCOPY (EGD);  Surgeon: Beryle Beams, MD;  Location: [facility];  Service: Endoscopy;  Laterality: N/A;  . FLEXIBLE SIGMOIDOSCOPY N/A 02/08/2017   Procedure: FLEXIBLE SIGMOIDOSCOPY;  Surgeon: Irene Shipper, MD;  Location: [facility] ENDOS[facility];  Service: Endoscopy;  Laterality: N/A;  . ICD GENERATOR CHANGEOUT N/A 04/08/2016   Procedure: ICD Generator Changeout;  Surgeon: Evans Lance, MD;  Location: [facility] CV LAB;  Service: Cardiovascular;  Laterality: N/A;  . ICD LEAD REMOVAL N/A 03/02/2017   Procedure: ICD LEAD REMOVAL;  Surgeon: Evans Lance, MD;  Location: [facility];  Service: Cardiovascular;  Laterality: N/A;  . IMPLANTABLE CARDIOVERTER DEFIBRILLATOR (ICD) GENERATOR CHANGE N/A 05/20/2011   Procedure: ICD GENERATOR CHANGE;  Surgeon: Evans Lance, MD;  Medtronic secure dual-chamber ICD serial number [id number]   . KNEE ARTHROSCOPY     right; "just went in and scraped it"  . LEAD INSERTION N/A 03/02/2017   Procedure: LEAD INSERTION;  Surgeon: Evans Lance, MD;  Location: [facility];  Service: Cardiovascular;  Laterality: N/A;  . MASS EXCISION Right 05/10/2013   Procedure: EXCISION MASS RIGHT THUMB;  Surgeon: Wynonia Sours, MD;  Location: [facility];  Service: Orthopedics;  Laterality: Right;  . PROXIMAL INTERPHALANGEAL FUSION (PIP) Right 05/10/2013   Procedure: DEBRIDEMENT PROXIMAL INTERPHALANGEAL FUSION (PIP);  Surgeon: Wynonia Sours, MD;  Location: [facility];  Service: Orthopedics;  Laterality: Right;  . TONSILLECTOMY             Home Medications    Prior to  Admission medications   Medication Sig Start Date End Date Taking? Authorizing Provider  ADVAIR DISKUS 250-50 MCG/DOSE AEPB Inhale 1 puff into the lungs 2 (two) times daily.  11/13/14   [provider]  calcium carbonate (TUMS - DOSED IN MG ELEMENTAL CALCIUM) 500 MG chewable tablet Chew 1 tablet by mouth daily as needed for heartburn.     [provider]  digoxin (LANOXIN) 0.125 MG tablet Take 1 tablet (0.125 mg total) by mouth daily. 02/10/17   Janece Canterbury, MD  fluticasone (FLONASE) 50 MCG/ACT nasal spray Place 2 sprays into both nostrils daily. Patient taking differently: Place 1 spray into both nostrils daily as needed for allergies.  01/04/17   [name], MD  furosemide (LASIX) 40 MG tablet Take 1 tablet (40 mg total) by mouth daily. May take an extra tab daily as needed for swelling 01/04/17   [name], MD  gabapentin (NEURONTIN) 100 MG capsule TAKE 1 CAPSULE (100MG ) TO 3 CAPSULES (300MG ) EACH NIGHT AS NEEDED FOR MUSCLE PAINS 10/05/16   [name], MD  LINZESS 72 MCG capsule Take 72 mcg by mouth daily as needed (for constipation).  03/07/16   [provider]  loratadine (CLARITIN) 10 MG tablet Take 10 mg by mouth daily as needed for allergies.     [provider]  metoprolol succinate (TOPROL-XL) 25 MG 24  hr tablet Take 1 tablet (25 mg total) by mouth daily. 02/10/17   Janece Canterbury, MD  nitroGLYCERIN (NITROSTAT) 0.4 MG SL tablet PLACE ONE UNDER TONGUE FOR CHEST PAIN. Patient taking differently: Place 0.4 mg under tongue as needed for chest pain 11/22/16   [name], Peter M, MD  polyethylene glycol [facility] / Floria Raveling) packet Take 17 g by mouth daily as needed for mild constipation. 02/10/17   Janece Canterbury, MD  potassium chloride (K-DUR,KLOR-CON) 10 MEQ tablet Take 1 tablet (10 mEq total) by mouth daily. 01/09/13   [name], Peter M, MD  ranitidine (ZANTAC) 150 MG tablet Take 1 tablet (150 mg total) 2 (two) times daily by mouth. 12/20/16   [name], MD  RESTASIS 0.05 % ophthalmic emulsion INSTILL 1 DROP INTO BOTH EYES TWICE A DAY 11/17/16   [name], MD  traMADol (ULTRAM) 50 MG tablet TAKE 2 TABLETS BY MOUTH EVERY 8 HOURS AS NEEDED FOR PAIN 03/24/17   [name], MD    Family History Family History  Problem Relation Age of Onset  . Tuberculosis Mother   . Tuberculosis Father   . Heart disease Brother   . Diabetes Sister   . Diabetes Brother   . Clotting disorder Brother     Social History Social History   Tobacco Use  . Smoking status: Former Smoker    Packs/day: 2.00    Years: 30.00    Pack years: 60.00    Types: Cigarettes    Last attempt to quit: 06/23/1976    Years since quitting: 40.7  . Smokeless tobacco: Never Used  Substance Use Topics  . Alcohol use: No  . Drug use: No     Allergies   Tamsulosin; Celebrex [celecoxib]; Dronedarone; Esomeprazole magnesium; Digoxin; Hydrocodone-acetaminophen; and Protonix [pantoprazole sodium]   Review of Systems Review of Systems  Constitutional: Positive for malaise/fatigue. Negative for chills, diaphoresis, fatigue and fever.  HENT: Negative for congestion.   Eyes: Negative for visual disturbance.  Respiratory: Positive for chest tightness and shortness of breath. Negative for cough, sputum production, wheezing and stridor.   Cardiovascular: Positive for chest pain. Negative for palpitations, syncope and near-syncope.  Gastrointestinal: Positive for abdominal pain and nausea. Negative for diarrhea and vomiting.  Genitourinary: Negative for dysuria.  Musculoskeletal: Negative for back pain and neck pain.  Neurological: Positive for light-headedness and headaches. Negative for weakness and numbness.  Psychiatric/Behavioral: Negative for agitation.  All other systems reviewed and are negative.    Physical Exam Updated Vital Signs BP (!) 157/83 (BP Location: Left Arm)   Pulse 92   Temp 98 F (36.7 C) (Oral)   Resp 18   SpO2 95%   Physical  Exam  Constitutional: He is oriented to person, place, and time. He appears well-developed and well-nourished. No distress.  HENT:  Head: Normocephalic and atraumatic.  Nose: Nose normal.  Mouth/Throat: Oropharynx is clear and moist. No oropharyngeal exudate.  Eyes: Conjunctivae and EOM are normal. Pupils are equal, round, and reactive to light.  Neck: Normal range of motion.  Cardiovascular: Normal rate and intact distal pulses.  No murmur heard. Pulmonary/Chest: Effort normal. No stridor. No respiratory distress. He has no wheezes. He exhibits no tenderness.  Abdominal: He exhibits no distension. There is no tenderness.  Musculoskeletal: Normal range of motion. He exhibits no edema or tenderness.  Neurological: He is alert and oriented to person, place, and time. No sensory deficit. He exhibits normal muscle tone.  Skin: Capillary refill takes less than 2  seconds. He is not diaphoretic. No erythema. No pallor.  Psychiatric: He has a normal mood and affect.  Nursing note and vitals reviewed.    ED Treatments / Results  Labs (all labs ordered are listed, but only abnormal results are displayed) Labs Reviewed  URINALYSIS, ROUTINE W REFLEX MICROSCOPIC - Abnormal; Notable for the following components:      Result Value   Specific Gravity, Urine >1.046 (*)    Hgb urine dipstick SMALL (*)    Ketones, ur 20 (*)    Squamous Epithelial / LPF 0-5 (*)    All other components within normal limits  PROTIME-INR - Abnormal; Notable for the following components:   Prothrombin Time 17.8 (*)    All other components within normal limits  COMPREHENSIVE METABOLIC PANEL - Abnormal; Notable for the following components:   Chloride 99 (*)    Glucose, Bld 101 (*)    BUN 24 (*)    Creatinine, Ser 1.32 (*)    ALT 10 (*)    Total Bilirubin 1.7 (*)    GFR calc non Af Amer 49 (*)    GFR calc Af Amer 56 (*)    Anion gap 16 (*)    All other components within normal limits  DIGOXIN LEVEL - Abnormal;  Notable for the following components:   Digoxin Level 0.4 (*)    All other components within normal limits  URINE CULTURE  LIPASE, BLOOD  CBC WITH DIFFERENTIAL/PLATELET  I-STAT CG4 LACTIC ACID, ED  I-STAT TROPONIN, ED  I-STAT CG4 LACTIC ACID, ED    EKG  EKG Interpretation  Date/Time:  Sunday March 26 2017 17:05:03 EST Ventricular Rate:  92 PR Interval:    QRS Duration: 108 QT Interval:  395 QTC Calculation: 473 R Axis:   23 Text Interpretation:  Atrial fibrillation Low voltage, extremity leads Nonspecific T abnormalities, lateral leads When compared to prior, no signifiacnt changes seen.  No STEMI Confirmed by Antony Blackbird [id number]) on 03/26/2017 5:26:50 PM       Radiology Ct Head Wo Contrast  Result Date: 03/26/2017 CLINICAL DATA:  Fall today. Posttraumatic headache. Initial encounter. Personal history of subdural hematoma. EXAM: CT HEAD WITHOUT CONTRAST TECHNIQUE: Contiguous axial images were obtained from the base of the skull through the vertex without intravenous contrast. COMPARISON:  03/24/2015 FINDINGS: Brain: No evidence of acute infarction, hemorrhage, hydrocephalus, extra-axial collection, or mass lesion/mass effect. Stable mild cerebral atrophy and chronic small vessel disease. Vascular:  No hyperdense vessel or other acute findings. Skull: No evidence of fracture or other significant bone abnormality. Sinuses/Orbits:  No acute findings. Other: None. IMPRESSION: No acute intracranial abnormality. Stable mild cerebral atrophy and chronic small vessel disease. Electronically Signed   By: Earle Gell M.D.   On: 03/26/2017 20:52   Ct Chest W Contrast  Result Date: 03/26/2017 CLINICAL DATA:  84 year old male with abdominal trauma. Generalized soreness secondary to fall. EXAM: CT CHEST, ABDOMEN, AND PELVIS WITH CONTRAST TECHNIQUE: Multidetector CT imaging of the chest, abdomen and pelvis was performed following the standard protocol during bolus administration of intravenous  contrast. CONTRAST:  32mL ISOVUE-300 IOPAMIDOL (ISOVUE-300) INJECTION 61% COMPARISON:  Chest radiograph dated 03/25/2017 and CT of the abdomen pelvis dated 02/06/2017 FINDINGS: CT CHEST FINDINGS Cardiovascular: Mild cardiomegaly with dilatation of the right atrium. No pericardial effusion. Postsurgical changes and epicardial wires noted. Left pectoral pacemaker device. There is moderate atherosclerotic calcification of the thoracic aorta. No aneurysmal dilatation or dissection. There is thickened appearance of the wall of the left brachiocephalic  trunk. There is partially occlusive thrombus in the subsegmental right lower lobe pulmonary artery branch which may represent residual thrombus prior PE or scarring with acute nonocclusive thrombus is less likely but not excluded. Correlation with history of previous PE recommended. No large or occlusive pulmonary artery embolus identified. Mediastinum/Nodes: No hilar or mediastinal adenopathy. The esophagus is grossly unremarkable. Bilateral hypodense thyroid nodules measure up to 3.7 cm in the inferior pole of the left thyroid gland. Lungs/Pleura: There is emphysematous changes of the lungs. Postsurgical changes of resection of the previously seen right upper lobe mass. Right lung base subpleural rounded atelectasis or scarring. No new consolidative changes. Small chronic right pleural effusion. No pneumothorax. The central airways are patent. Musculoskeletal: There is osteopenia with degenerative changes of the spine. No acute osseous pathology. Median sternotomy wires noted. CT ABDOMEN PELVIS FINDINGS No intra-abdominal free air or free fluid. Hepatobiliary: Cholecystectomy. Focal area of hypodensity along the falciform ligament in the left lobe of the liver similar to prior CT and may represent focal fatty infiltration. A smaller subcentimeter hypodense lesion in the inferior aspect of the right lobe of the liver (series 3 image 489) is not well characterized. No  intrahepatic biliary ductal dilatation. The gallbladder is surgically absent. Pancreas: Unremarkable. No pancreatic ductal dilatation or surrounding inflammatory changes. Spleen: Dystrophic calcification of the spleen as before. Adrenals/Urinary Tract: Subcentimeter left adrenal adenoma. The right adrenal gland is unremarkable. Probable 1 cm right renal inferior pole cyst. Right renal upper pole subcentimeter hypodensity is not characterized. There is no hydronephrosis on either side. There is symmetric enhancement and excretion of contrast by both kidneys. The visualized ureters and urinary bladder appear unremarkable. Stomach/Bowel: There is sigmoid diverticulosis without active inflammatory changes. No bowel obstruction or active inflammation. Normal appendix. Vascular/Lymphatic: Moderate aortoiliac atherosclerotic disease. The origins of the celiac axis, SMA, IMA are patent. No portal venous gas. There is no adenopathy. Reproductive: The prostate is grossly unremarkable. Other: Epicardial wires noted in the left anterior abdominal wall Musculoskeletal: Degenerative changes of the spine and hips. Large anterior osteophyte at L2-L3 abutting the posterior aorta. No acute osseous pathology. IMPRESSION: 1. No acute/traumatic intrathoracic, abdominal, or pelvic pathology. 2. Nonocclusive pulmonary embolus in the subsegmental right lower lobe pulmonary artery branches likely old PE or scarring. Nonocclusive acute PE is not entirely excluded. Clinical correlation is recommended. 3. Mild cardiomegaly with dilatation of the right atrium. 4. 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 Small right pleural effusion and stable right lung base round atelectasis/scarring. 5. Aortic Atherosclerosis (ICD10-I70.0) and Emphysema (ICD10-J43.9). These results were called by telephone at the time of interpretation on 03/26/2017 at 11:27 pm to Dr. Marda Stalker , who verbally acknowledged these results.  Electronically Signed   By: Anner Crete M.D.   On: 03/26/2017 23:32   Ct Abdomen Pelvis W Contrast  Result Date: 03/26/2017 CLINICAL DATA:  84 year old male with abdominal trauma. Generalized soreness secondary to fall. EXAM: CT CHEST, ABDOMEN, AND PELVIS WITH CONTRAST TECHNIQUE: Multidetector CT imaging of the chest, abdomen and pelvis was performed following the standard protocol during bolus administration of intravenous contrast. CONTRAST:  32mL ISOVUE-300 IOPAMIDOL (ISOVUE-300) INJECTION 61% COMPARISON:  Chest radiograph dated 03/25/2017 and CT of the abdomen pelvis dated 02/06/2017 FINDINGS: CT CHEST FINDINGS Cardiovascular: Mild cardiomegaly with dilatation of the right atrium. No pericardial effusion. Postsurgical changes and epicardial wires noted. Left pectoral pacemaker device. There is moderate atherosclerotic calcification of the thoracic aorta. No aneurysmal dilatation or dissection. There is thickened appearance  of the wall of the left brachiocephalic trunk. There is partially occlusive thrombus in the subsegmental right lower lobe pulmonary artery branch which may represent residual thrombus prior PE or scarring with acute nonocclusive thrombus is less likely but not excluded. Correlation with history of previous PE recommended. No large or occlusive pulmonary artery embolus identified. Mediastinum/Nodes: No hilar or mediastinal adenopathy. The esophagus is grossly unremarkable. Bilateral hypodense thyroid nodules measure up to 3.7 cm in the inferior pole of the left thyroid gland. Lungs/Pleura: There is emphysematous changes of the lungs. Postsurgical changes of resection of the previously seen right upper lobe mass. Right lung base subpleural rounded atelectasis or scarring. No new consolidative changes. Small chronic right pleural effusion. No pneumothorax. The central airways are patent. Musculoskeletal: There is osteopenia with degenerative changes of the spine. No acute osseous  pathology. Median sternotomy wires noted. CT ABDOMEN PELVIS FINDINGS No intra-abdominal free air or free fluid. Hepatobiliary: Cholecystectomy. Focal area of hypodensity along the falciform ligament in the left lobe of the liver similar to prior CT and may represent focal fatty infiltration. A smaller subcentimeter hypodense lesion in the inferior aspect of the right lobe of the liver (series 3 image 489) is not well characterized. No intrahepatic biliary ductal dilatation. The gallbladder is surgically absent. Pancreas: Unremarkable. No pancreatic ductal dilatation or surrounding inflammatory changes. Spleen: Dystrophic calcification of the spleen as before. Adrenals/Urinary Tract: Subcentimeter left adrenal adenoma. The right adrenal gland is unremarkable. Probable 1 cm right renal inferior pole cyst. Right renal upper pole subcentimeter hypodensity is not characterized. There is no hydronephrosis on either side. There is symmetric enhancement and excretion of contrast by both kidneys. The visualized ureters and urinary bladder appear unremarkable. Stomach/Bowel: There is sigmoid diverticulosis without active inflammatory changes. No bowel obstruction or active inflammation. Normal appendix. Vascular/Lymphatic: Moderate aortoiliac atherosclerotic disease. The origins of the celiac axis, SMA, IMA are patent. No portal venous gas. There is no adenopathy. Reproductive: The prostate is grossly unremarkable. Other: Epicardial wires noted in the left anterior abdominal wall Musculoskeletal: Degenerative changes of the spine and hips. Large anterior osteophyte at L2-L3 abutting the posterior aorta. No acute osseous pathology. IMPRESSION: 1. No acute/traumatic intrathoracic, abdominal, or pelvic pathology. 2. Nonocclusive pulmonary embolus in the subsegmental right lower lobe pulmonary artery branches likely old PE or scarring. Nonocclusive acute PE is not entirely excluded. Clinical correlation is recommended. 3. Mild  cardiomegaly with dilatation of the right atrium. 4. 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 Small right pleural effusion and stable right lung base round atelectasis/scarring. 5. Aortic Atherosclerosis (ICD10-I70.0) and Emphysema (ICD10-J43.9). These results were called by telephone at the time of interpretation on 03/26/2017 at 11:27 pm to Dr. Marda Stalker , who verbally acknowledged these results. Electronically Signed   By: Anner Crete M.D.   On: 03/26/2017 23:32    Procedures Procedures (including critical care time)  Medications Ordered in ED Medications  sodium chloride 0.9 % injection (not administered)  iopamidol (ISOVUE-300) 61 % injection (not administered)  mometasone-formoterol (DULERA) 200-5 MCG/ACT inhaler 2 puff (2 puffs Inhalation Not Given 03/27/17 0143)  digoxin (LANOXIN) tablet 0.125 mg (not administered)  fluticasone (FLONASE) 50 MCG/ACT nasal spray 1 spray (not administered)  furosemide (LASIX) tablet 40 mg (not administered)  gabapentin (NEURONTIN) capsule 100-300 mg (not administered)  linaclotide (LINZESS) capsule 72 mcg (not administered)  loratadine (CLARITIN) tablet 10 mg (not administered)  metoprolol succinate (TOPROL-XL) 24 hr tablet 25 mg (not administered)  potassium chloride (K-DUR,KLOR-CON) CR  tablet 10 mEq (not administered)  polyethylene glycol (MIRALAX / GLYCOLAX) packet 17 g (not administered)  traMADol (ULTRAM) tablet 50 mg (not administered)  cycloSPORINE (RESTASIS) 0.05 % ophthalmic emulsion 1 drop (not administered)  famotidine (PEPCID) tablet 20 mg (not administered)  calcium carbonate (TUMS - dosed in mg elemental calcium) chewable tablet 200 mg of elemental calcium (not administered)  acetaminophen (TYLENOL) tablet 650 mg (not administered)    Or  acetaminophen (TYLENOL) suppository 650 mg (not administered)  ondansetron (ZOFRAN) tablet 4 mg (not administered)    Or  ondansetron (ZOFRAN) injection 4 mg (not  administered)  iopamidol (ISOVUE-300) 61 % injection (75 mLs  Contrast Given 03/26/17 2024)     Initial Impression / Assessment and Plan / ED Course  I have reviewed the triage vital signs and the nursing notes.  Pertinent labs & imaging results that were available during my care of the patient were reviewed by me and considered in my medical decision making (see chart for details).     [name] is a 84 y.o. male with a past medical history with asthma, CHF on digoxin and Lasix, CAD status post CABG, atrial fibrillation with defibrillator, diabetes, prior subdural hematoma, and GI bleed who presents with fall.  Patient reports that he had a fall 2 days ago where he got tripped up on his cane in his cane hit him in his upper abdomen lower chest.  He reports that he came to the ED yesterday and had a reassuring workup of chest x-ray and labs and was sent home.  Patient says that today he is having worsened pain that is described as an 8 out of 10 in severity.  He is having nausea and dry heaving vomiting.  He has had no p.o. intake.  He reports constipation and has not had a bowel movement since the fall.  He denies any diarrhea.  He reports his urine is decreased but no dysuria.  He reports he is still passing flatus.  He denies any chest pain but does report shortness of breath and discomfort in the lower chest.  He denies fevers, chills, rhinorrhea, congestion, or productive cough.  He reports that today he also began having headache that is moderate to severe.  He reports intermittent diplopia that has resolved.  He also feels lightheaded but no room spinning dizziness.  He thinks he had a mechanical fall where he got tripped up on his cane.  On exam, patient has tenderness across his abdomen primarily the upper abdomen.  Patient's lungs were clear.  Chest was overall nontender however there was some tenderness at the xiphoid area.  Patient had no neck tenderness and no head tenderness.   Patient had normal extraocular movements and pupils reactive bilaterally.  No focal neurologic deficits on my exam.  No evidence of trauma in the extremities and no tenderness present.  No significant back tenderness.  Patient was seen yesterday and was given return precautions for worsening symptoms which is not having.  Patient will have more blood work as well as workup to look for traumatic injuries that may been missed on initial x-rays.  Patient will have CT imaging of the chest abdomen pelvis.  He will also have CT of the head given history of subdural hematoma and the reported diplopia with headache.  Patient denies blood thinner use.  Patient will also have urinalysis to look for UTI with his reported decreased urine output.  Anticipate reassessment after workup.  12:18 AM Patient's diagnostic  imaging returned showing no evidence of fracture but instead revealed pulmonary embolism.  Given the patient's pain in his upper abdomen and lower chest, this may be related to his discomfort and shortness of breath.  It does not appear to be a traumatic injury causing his symptoms.  A review of the patient's chart revealed that patient had a history of a spontaneous subarachnoid's and GI bleed when he was on anticoagulation previously for his atrial fibrillation, so it is a difficult position he is in as to anticoagulation choices.  Pulmonary was called and they agreed that the patient likely need admission to have further discussions of anticoagulation and for further observation of his symptoms.  If the patient was to be started on blood thinners, he would likely need to be watched for symptom development.  The patient was having headaches on arrival but his CT head showed no intracranial injury at this time.   Hospitalist team will be called for admission given the patient's pulmonary embolism, intermittent shortness of breath and intermittent chest pain in the setting of prior complications of  anticoagulant.    Final Clinical Impressions(s) / ED Diagnoses   Final diagnoses:  Other pulmonary embolism without acute cor pulmonale, unspecified chronicity (HCC)     Clinical Impression: 1. Other pulmonary embolism without acute cor pulmonale, unspecified chronicity ([place])     Disposition: Admit  This note was prepared with assistance of Dragon voice recognition software. Occasional wrong-word or sound-a-like substitutions may have occurred due to the inherent limitations of voice recognition software.      Elandra Powell, Gwenyth Allegra, MD 03/27/17 [phone]

## 2017-03-26 NOTE — ED Notes (Addendum)
Pt. Attempted urine specimen but unsuccessful.

## 2017-03-27 ENCOUNTER — Observation Stay (HOSPITAL_BASED_OUTPATIENT_CLINIC_OR_DEPARTMENT_OTHER): Payer: Medicare Other

## 2017-03-27 ENCOUNTER — Other Ambulatory Visit (HOSPITAL_COMMUNITY): Payer: Medicare Other

## 2017-03-27 ENCOUNTER — Ambulatory Visit: Payer: Self-pay | Admitting: *Deleted

## 2017-03-27 DIAGNOSIS — R410 Disorientation, unspecified: Secondary | ICD-10-CM | POA: Diagnosis not present

## 2017-03-27 DIAGNOSIS — Z95810 Presence of automatic (implantable) cardiac defibrillator: Secondary | ICD-10-CM | POA: Diagnosis not present

## 2017-03-27 DIAGNOSIS — R451 Restlessness and agitation: Secondary | ICD-10-CM | POA: Diagnosis not present

## 2017-03-27 DIAGNOSIS — E1122 Type 2 diabetes mellitus with diabetic chronic kidney disease: Secondary | ICD-10-CM | POA: Diagnosis present

## 2017-03-27 DIAGNOSIS — I482 Chronic atrial fibrillation: Secondary | ICD-10-CM | POA: Diagnosis present

## 2017-03-27 DIAGNOSIS — Z87891 Personal history of nicotine dependence: Secondary | ICD-10-CM | POA: Diagnosis not present

## 2017-03-27 DIAGNOSIS — I2782 Chronic pulmonary embolism: Secondary | ICD-10-CM | POA: Diagnosis present

## 2017-03-27 DIAGNOSIS — Z7901 Long term (current) use of anticoagulants: Secondary | ICD-10-CM | POA: Diagnosis not present

## 2017-03-27 DIAGNOSIS — I2699 Other pulmonary embolism without acute cor pulmonale: Secondary | ICD-10-CM

## 2017-03-27 DIAGNOSIS — I10 Essential (primary) hypertension: Secondary | ICD-10-CM | POA: Diagnosis not present

## 2017-03-27 DIAGNOSIS — W010XXA Fall on same level from slipping, tripping and stumbling without subsequent striking against object, initial encounter: Secondary | ICD-10-CM | POA: Diagnosis present

## 2017-03-27 DIAGNOSIS — Z8249 Family history of ischemic heart disease and other diseases of the circulatory system: Secondary | ICD-10-CM | POA: Diagnosis not present

## 2017-03-27 DIAGNOSIS — J45909 Unspecified asthma, uncomplicated: Secondary | ICD-10-CM | POA: Diagnosis present

## 2017-03-27 DIAGNOSIS — Z955 Presence of coronary angioplasty implant and graft: Secondary | ICD-10-CM | POA: Diagnosis not present

## 2017-03-27 DIAGNOSIS — Z008 Encounter for other general examination: Secondary | ICD-10-CM | POA: Diagnosis not present

## 2017-03-27 DIAGNOSIS — R079 Chest pain, unspecified: Secondary | ICD-10-CM | POA: Diagnosis not present

## 2017-03-27 DIAGNOSIS — I2510 Atherosclerotic heart disease of native coronary artery without angina pectoris: Secondary | ICD-10-CM | POA: Diagnosis present

## 2017-03-27 DIAGNOSIS — I5022 Chronic systolic (congestive) heart failure: Secondary | ICD-10-CM | POA: Diagnosis not present

## 2017-03-27 DIAGNOSIS — I472 Ventricular tachycardia: Secondary | ICD-10-CM | POA: Diagnosis present

## 2017-03-27 DIAGNOSIS — I255 Ischemic cardiomyopathy: Secondary | ICD-10-CM | POA: Diagnosis present

## 2017-03-27 DIAGNOSIS — I130 Hypertensive heart and chronic kidney disease with heart failure and stage 1 through stage 4 chronic kidney disease, or unspecified chronic kidney disease: Secondary | ICD-10-CM | POA: Diagnosis present

## 2017-03-27 DIAGNOSIS — Z781 Physical restraint status: Secondary | ICD-10-CM | POA: Diagnosis not present

## 2017-03-27 DIAGNOSIS — K219 Gastro-esophageal reflux disease without esophagitis: Secondary | ICD-10-CM | POA: Diagnosis present

## 2017-03-27 DIAGNOSIS — I252 Old myocardial infarction: Secondary | ICD-10-CM | POA: Diagnosis not present

## 2017-03-27 DIAGNOSIS — Z87820 Personal history of traumatic brain injury: Secondary | ICD-10-CM | POA: Diagnosis not present

## 2017-03-27 DIAGNOSIS — Z951 Presence of aortocoronary bypass graft: Secondary | ICD-10-CM | POA: Diagnosis not present

## 2017-03-27 DIAGNOSIS — N183 Chronic kidney disease, stage 3 (moderate): Secondary | ICD-10-CM | POA: Diagnosis not present

## 2017-03-27 DIAGNOSIS — Z885 Allergy status to narcotic agent status: Secondary | ICD-10-CM | POA: Diagnosis not present

## 2017-03-27 DIAGNOSIS — E785 Hyperlipidemia, unspecified: Secondary | ICD-10-CM | POA: Diagnosis present

## 2017-03-27 DIAGNOSIS — R531 Weakness: Secondary | ICD-10-CM | POA: Diagnosis present

## 2017-03-27 LAB — CBC
HCT: 43.4 % (ref 39.0–52.0)
Hemoglobin: 14.4 g/dL (ref 13.0–17.0)
MCH: 30.5 pg (ref 26.0–34.0)
MCHC: 33.2 g/dL (ref 30.0–36.0)
MCV: 91.9 fL (ref 78.0–100.0)
PLATELETS: 165 10*3/uL (ref 150–400)
RBC: 4.72 MIL/uL (ref 4.22–5.81)
RDW: 15.9 % — AB (ref 11.5–15.5)
WBC: 7.6 10*3/uL (ref 4.0–10.5)

## 2017-03-27 LAB — URINALYSIS, ROUTINE W REFLEX MICROSCOPIC
Bacteria, UA: NONE SEEN
Bilirubin Urine: NEGATIVE
Glucose, UA: NEGATIVE mg/dL
Ketones, ur: 20 mg/dL — AB
LEUKOCYTES UA: NEGATIVE
Nitrite: NEGATIVE
PH: 6 (ref 5.0–8.0)
Protein, ur: NEGATIVE mg/dL

## 2017-03-27 LAB — CREATININE, SERUM
CREATININE: 1.19 mg/dL (ref 0.61–1.24)
GFR calc Af Amer: >60 mL/min (ref >60)
GFR, EST NON AFRICAN AMERICAN: 55 mL/min — AB (ref >60)

## 2017-03-27 MED ORDER — POTASSIUM CHLORIDE CRYS ER 10 MEQ PO TBCR
10.0000 meq | EXTENDED_RELEASE_TABLET | Freq: Every day | ORAL | Status: DC
  Administered 2017-03-27 – 2017-03-29 (×3): 10 meq via ORAL
  Filled 2017-03-27 (×3): qty 1

## 2017-03-27 MED ORDER — CYCLOSPORINE 0.05 % OP EMUL
1.0000 [drp] | Freq: Two times a day (BID) | OPHTHALMIC | Status: DC
  Administered 2017-03-27 – 2017-03-29 (×5): 1 [drp] via OPHTHALMIC
  Filled 2017-03-27 (×5): qty 1

## 2017-03-27 MED ORDER — FLUTICASONE PROPIONATE 50 MCG/ACT NA SUSP: 1.0000 | Freq: Every day | NASAL | Status: DC | PRN

## 2017-03-27 MED ORDER — METOPROLOL SUCCINATE ER 25 MG PO TB24
25.0000 mg | ORAL_TABLET | Freq: Every day | ORAL | Status: DC
  Administered 2017-03-27 – 2017-03-29 (×3): 25 mg via ORAL
  Filled 2017-03-27 (×3): qty 1

## 2017-03-27 MED ORDER — FUROSEMIDE 40 MG PO TABS
40.0000 mg | ORAL_TABLET | Freq: Every day | ORAL | Status: DC
  Administered 2017-03-27 – 2017-03-29 (×3): 40 mg via ORAL
  Filled 2017-03-27 (×3): qty 1

## 2017-03-27 MED ORDER — LINACLOTIDE 72 MCG PO CAPS
72.0000 ug | ORAL_CAPSULE | Freq: Every day | ORAL | Status: DC | PRN
  Filled 2017-03-27: qty 1

## 2017-03-27 MED ORDER — ACETAMINOPHEN 325 MG PO TABS: 650.0000 mg | ORAL_TABLET | Freq: Four times a day (QID) | ORAL | Status: DC | PRN

## 2017-03-27 MED ORDER — ONDANSETRON HCL 4 MG/2ML IJ SOLN: 4.0000 mg | Freq: Four times a day (QID) | INTRAMUSCULAR | Status: DC | PRN

## 2017-03-27 MED ORDER — MOMETASONE FURO-FORMOTEROL FUM 200-5 MCG/ACT IN AERO
2.0000 | INHALATION_SPRAY | Freq: Two times a day (BID) | RESPIRATORY_TRACT | Status: DC
  Administered 2017-03-27 – 2017-03-29 (×5): 2 via RESPIRATORY_TRACT
  Filled 2017-03-27: qty 8.8

## 2017-03-27 MED ORDER — DIGOXIN 125 MCG PO TABS
0.1250 mg | ORAL_TABLET | Freq: Every day | ORAL | Status: DC
  Administered 2017-03-27 – 2017-03-29 (×3): 0.125 mg via ORAL
  Filled 2017-03-27 (×3): qty 1

## 2017-03-27 MED ORDER — GABAPENTIN 100 MG PO CAPS
100.0000 mg | ORAL_CAPSULE | Freq: Two times a day (BID) | ORAL | Status: DC
  Administered 2017-03-27: 200 mg via ORAL
  Administered 2017-03-27: 300 mg via ORAL
  Administered 2017-03-28 – 2017-03-29 (×3): 100 mg via ORAL
  Filled 2017-03-27 (×4): qty 1
  Filled 2017-03-27: qty 3
  Filled 2017-03-27: qty 1

## 2017-03-27 MED ORDER — CALCIUM CARBONATE ANTACID 500 MG PO CHEW: 1.0000 | CHEWABLE_TABLET | Freq: Every day | ORAL | Status: DC | PRN

## 2017-03-27 MED ORDER — ONDANSETRON HCL 4 MG PO TABS: 4.0000 mg | ORAL_TABLET | Freq: Four times a day (QID) | ORAL | Status: DC | PRN

## 2017-03-27 MED ORDER — LORATADINE 10 MG PO TABS
10.0000 mg | ORAL_TABLET | Freq: Every day | ORAL | Status: DC | PRN
  Filled 2017-03-27: qty 1

## 2017-03-27 MED ORDER — HEPARIN SODIUM (PORCINE) 5000 UNIT/ML IJ SOLN
5000.0000 [IU] | Freq: Three times a day (TID) | INTRAMUSCULAR | Status: DC
  Administered 2017-03-27 – 2017-03-29 (×5): 5000 [IU] via SUBCUTANEOUS
  Filled 2017-03-27 (×5): qty 1

## 2017-03-27 MED ORDER — FAMOTIDINE 20 MG PO TABS
20.0000 mg | ORAL_TABLET | Freq: Two times a day (BID) | ORAL | Status: DC
  Administered 2017-03-27 – 2017-03-29 (×5): 20 mg via ORAL
  Filled 2017-03-27 (×5): qty 1

## 2017-03-27 MED ORDER — ACETAMINOPHEN 650 MG RE SUPP: 650.0000 mg | Freq: Four times a day (QID) | RECTAL | Status: DC | PRN

## 2017-03-27 MED ORDER — TRAMADOL HCL 50 MG PO TABS
50.0000 mg | ORAL_TABLET | Freq: Four times a day (QID) | ORAL | Status: DC | PRN
  Administered 2017-03-27: 50 mg via ORAL
  Filled 2017-03-27: qty 1

## 2017-03-27 MED ORDER — POLYETHYLENE GLYCOL 3350 17 G PO PACK: 17.0000 g | PACK | Freq: Every day | ORAL | Status: DC | PRN

## 2017-03-27 NOTE — ED Notes (Addendum)
ED TO INPATIENT HANDOFF REPORT  Name/Age/Gender [name] 84 y.o. male  Code Status    Code Status Orders  (From admission, onward)        Start     Ordered   03/27/17 0037  Full code  Continuous     03/27/17 0037    Code Status History    Date Active Date Inactive Code Status Order ID Comments User Context   03/02/2017 16:57 03/05/2017 17:04 Full Code [id number]  Evans Lance, MD Inpatient   02/06/2017 23:06 02/10/2017 15:31 Full Code [id number]  Theodis Blaze, MD Inpatient   11/10/2016 20:31 11/14/2016 14:56 Full Code [id number]  Jacolyn Reedy, MD ED   04/08/2016 14:28 04/09/2016 14:11 Full Code [id number]  Evans Lance, MD Inpatient   09/26/2011 12:52 10/11/2011 19:49 Full Code [id number]  Samella Parr, NP Inpatient      Home/SNF/Other Home  Chief Complaint head aches  Level of Care/Admitting Diagnosis ED Disposition    ED Disposition Condition [facility] Area: [facility] [[id number]]  Level of Care: Telemetry [5]  Admit to tele based on following criteria: Other see comments  Comments: PE  Diagnosis: Pulmonary embolism ([place]) [573220]  Admitting Physician: Etta Quill [phone]  Attending Physician: Etta Quill [[id number]]  PT Class (Do Not Modify): Observation [104]  PT Acc Code (Do Not Modify): Observation [10022]       Medical History Past Medical History:  Diagnosis Date  . Abnormal thyroid scan    Abnormal thyroid imaging studies from 11/09/2010, status post ultrasound guided fine needle aspiration of the dominant left inferior thyroid nodule on 12/15/2010. Cytology report showed rare follicular epithelial cells and hemosiderin laden macrophages.  Marland Kitchen AICD (automatic cardioverter/defibrillator) present    a. fx lead; a. s/p lead extraction 03/02/17  . Arthritis    "all over"  . Asthma   . Atrial fibrillation ([place])    on chronic Coumadin; stopped July 2013 due to subdural hematomas  . CHF (congestive heart  failure) (HCC)    EF 35-40% s/p most recent ICD generator change-out with [employer] dual-chamber ICD 05/20/11 with explantation of previous abdominally-implanted device  . Coronary artery disease    s/p CABG 1983 and PCI/stent 2004.   . Diabetes mellitus    diet controlled  . Dyslipidemia   . Erythrocytosis   . GERD (gastroesophageal reflux disease)   . Hypertension   . Ischemic cardiomyopathy    WITH CHF  . Monocytosis 04/17/2013  . Myocardial infarction ([place]) 1983; ~ 1990  . Pneumonia August 2013  . Subdural hematoma [facility]) July 2013   Anticoagulation stopped.   . SunDown syndrome   . VT (ventricular tachycardia) (HCC)     Allergies Allergies  Allergen Reactions  . Tamsulosin Other (See Comments)    Dizziness, Made BP very low and weakness  . Celebrex [Celecoxib] Hives and Nausea And Vomiting    Gi upset  . Dronedarone Nausea And Vomiting and Other (See Comments)    GI upset, abdominal pain  . Esomeprazole Magnesium Hives and Other (See Comments)    "don't really remember"  . Digoxin Diarrhea    May have caused some diarrhea  . Hydrocodone-Acetaminophen Other (See Comments)    Bad headache  . Protonix [Pantoprazole Sodium] Nausea And Vomiting and Other (See Comments)    Tolerates Dexilant    IV Location/Drains/Wounds Patient Lines/Drains/Airways Status   Active Line/Drains/Airways    Name:   Placement date:  Placement time:   Site:   Days:   Peripheral IV 03/26/17 Left;Upper Arm   03/26/17    2219    Arm   1   Incision (Closed) 03/02/17 Chest Left   03/02/17    1521     25   Incision (Closed) 03/02/17 Groin Right   03/02/17    1543     25          Labs/Imaging Results for orders placed or performed during the hospital encounter of 03/26/17 (from the past 48 hour(s))  Protime-INR     Status: Abnormal   Collection Time: 03/26/17  5:20 PM  Result Value Ref Range   Prothrombin Time 17.8 (H) 11.4 - 15.2 seconds   INR 1.48     Comment: Performed at [facility], [address] [place], [place] [address]  I-Stat Troponin, ED (not at [facility])     Status: None   Collection Time: 03/26/17  5:34 PM  Result Value Ref Range   Troponin i, poc 0.01 0.00 - 0.08 ng/mL   Comment 3            Comment: Due to the release kinetics of cTnI, a negative result within the first hours of the onset of symptoms does not rule out myocardial infarction with certainty. If myocardial infarction is still suspected, repeat the test at appropriate intervals.   I-Stat CG4 Lactic Acid, ED     Status: None   Collection Time: 03/26/17  5:36 PM  Result Value Ref Range   Lactic Acid, Venous 1.18 0.5 - 1.9 mmol/L  Comprehensive metabolic panel     Status: Abnormal   Collection Time: 03/26/17  6:58 PM  Result Value Ref Range   Sodium 139 135 - 145 mmol/L   Potassium 3.8 3.5 - 5.1 mmol/L   Chloride 99 (L) 101 - 111 mmol/L   CO2 24 22 - 32 mmol/L   Glucose, Bld 101 (H) 65 - 99 mg/dL   BUN 24 (H) 6 - 20 mg/dL   Creatinine, Ser 1.32 (H) 0.61 - 1.24 mg/dL   Calcium 9.5 8.9 - 10.3 mg/dL   Total Protein 7.0 6.5 - 8.1 g/dL   Albumin 4.2 3.5 - 5.0 g/dL   AST 23 15 - 41 U/L   ALT 10 (L) 17 - 63 U/L   Alkaline Phosphatase 62 38 - 126 U/L   Total Bilirubin 1.7 (H) 0.3 - 1.2 mg/dL   GFR calc non Af Amer 49 (L) >60 mL/min   GFR calc Af Amer 56 (L) >60 mL/min    Comment: (NOTE) The eGFR has been calculated using the CKD EPI equation. This calculation has not been validated in all clinical situations. eGFR's persistently <60 mL/min signify possible Chronic Kidney Disease.    Anion gap 16 (H) 5 - 15    Comment: Performed at [facility], [address] [place], [place] [address]  Lipase, blood     Status: None   Collection Time: 03/26/17  6:58 PM  Result Value Ref Range   Lipase 28 11 - 51 U/L    Comment: Performed at [facility], [address] [place], [place] [address]  Digoxin level     Status: Abnormal   Collection  Time: 03/26/17  6:58 PM  Result Value Ref Range   Digoxin Level 0.4 (L) 0.8 - 2.0 ng/mL    Comment: Performed at [facility], [address] [place],  [address]    I-Stat CG4 Lactic Acid, ED     Status: None   Collection Time: 03/26/17  7:09 PM  Result Value Ref Range   Lactic Acid, Venous 1.09 0.5 - 1.9 mmol/L   Dg Chest 2 View  Result Date: 03/25/2017 CLINICAL DATA:  Fall with left-sided pain EXAM: CHEST  2 VIEW COMPARISON:  03/23/2017, 03/03/2017, 12/23/2016 FINDINGS: Post sternotomy changes. Left-sided pacing device as before. Hyperinflation with tiny pleural effusion or pleural scarring. Multiple epicardial leads. No consolidation. No pneumothorax. Stable cardiomediastinal silhouette with aortic atherosclerosis. Degenerative changes of the spine IMPRESSION: No active cardiopulmonary disease. Stable tiny pleural effusions or pleural thickening. Hyperinflation. Electronically Signed   By: Kim  Fujinaga M.D.   On: 03/25/2017 02:43   Ct Head Wo Contrast  Result Date: 03/26/2017 CLINICAL DATA:  Fall today. Posttraumatic headache. Initial encounter. Personal history of subdural hematoma. EXAM: CT HEAD WITHOUT CONTRAST TECHNIQUE: Contiguous axial images were obtained from the base of the skull through the vertex without intravenous contrast. COMPARISON:  03/24/2015 FINDINGS: Brain: No evidence of acute infarction, hemorrhage, hydrocephalus, extra-axial collection, or mass lesion/mass effect. Stable mild cerebral atrophy and chronic small vessel disease. Vascular:  No hyperdense vessel or other acute findings. Skull: No evidence of fracture or other significant bone abnormality. Sinuses/Orbits:  No acute findings. Other: None. IMPRESSION: No acute intracranial abnormality. Stable mild cerebral atrophy and chronic small vessel disease. Electronically Signed   By: John  Stahl M.D.   On: 03/26/2017 20:52   Ct Chest W Contrast  Result Date: 03/26/2017 CLINICAL DATA:  84-year-old  male with abdominal trauma. Generalized soreness secondary to fall. EXAM: CT CHEST, ABDOMEN, AND PELVIS WITH CONTRAST TECHNIQUE: Multidetector CT imaging of the chest, abdomen and pelvis was performed following the standard protocol during bolus administration of intravenous contrast. CONTRAST:  75mL ISOVUE-300 IOPAMIDOL (ISOVUE-300) INJECTION 61% COMPARISON:  Chest radiograph dated 03/25/2017 and CT of the abdomen pelvis dated 02/06/2017 FINDINGS: CT CHEST FINDINGS Cardiovascular: Mild cardiomegaly with dilatation of the right atrium. No pericardial effusion. Postsurgical changes and epicardial wires noted. Left pectoral pacemaker device. There is moderate atherosclerotic calcification of the thoracic aorta. No aneurysmal dilatation or dissection. There is thickened appearance of the wall of the left brachiocephalic trunk. There is partially occlusive thrombus in the subsegmental right lower lobe pulmonary artery branch which may represent residual thrombus prior PE or scarring with acute nonocclusive thrombus is less likely but not excluded. Correlation with history of previous PE recommended. No large or occlusive pulmonary artery embolus identified. Mediastinum/Nodes: No hilar or mediastinal adenopathy. The esophagus is grossly unremarkable. Bilateral hypodense thyroid nodules measure up to 3.7 cm in the inferior pole of the left thyroid gland. Lungs/Pleura: There is emphysematous changes of the lungs. Postsurgical changes of resection of the previously seen right upper lobe mass. Right lung base subpleural rounded atelectasis or scarring. No new consolidative changes. Small chronic right pleural effusion. No pneumothorax. The central airways are patent. Musculoskeletal: There is osteopenia with degenerative changes of the spine. No acute osseous pathology. Median sternotomy wires noted. CT ABDOMEN PELVIS FINDINGS No intra-abdominal free air or free fluid. Hepatobiliary: Cholecystectomy. Focal area of  hypodensity along the falciform ligament in the left lobe of the liver similar to prior CT and may represent focal fatty infiltration. A smaller subcentimeter hypodense lesion in the inferior aspect of the right lobe of the liver (series 3 image 489) is not well characterized. No intrahepatic biliary ductal dilatation. The gallbladder is surgically absent. Pancreas: Unremarkable. No pancreatic ductal dilatation or surrounding inflammatory   changes. Spleen: Dystrophic calcification of the spleen as before. Adrenals/Urinary Tract: Subcentimeter left adrenal adenoma. The right adrenal gland is unremarkable. Probable 1 cm right renal inferior pole cyst. Right renal upper pole subcentimeter hypodensity is not characterized. There is no hydronephrosis on either side. There is symmetric enhancement and excretion of contrast by both kidneys. The visualized ureters and urinary bladder appear unremarkable. Stomach/Bowel: There is sigmoid diverticulosis without active inflammatory changes. No bowel obstruction or active inflammation. Normal appendix. Vascular/Lymphatic: Moderate aortoiliac atherosclerotic disease. The origins of the celiac axis, SMA, IMA are patent. No portal venous gas. There is no adenopathy. Reproductive: The prostate is grossly unremarkable. Other: Epicardial wires noted in the left anterior abdominal wall Musculoskeletal: Degenerative changes of the spine and hips. Large anterior osteophyte at L2-L3 abutting the posterior aorta. No acute osseous pathology. IMPRESSION: 1. No acute/traumatic intrathoracic, abdominal, or pelvic pathology. 2. Nonocclusive pulmonary embolus in the subsegmental right lower lobe pulmonary artery branches likely old PE or scarring. Nonocclusive acute PE is not entirely excluded. Clinical correlation is recommended. 3. Mild cardiomegaly with dilatation of the right atrium. 4. 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 Small right pleural effusion and stable  right lung base round atelectasis/scarring. 5. Aortic Atherosclerosis (ICD10-I70.0) and Emphysema (ICD10-J43.9). These results were called by telephone at the time of interpretation on 03/26/2017 at 11:27 pm to Dr. Marda Stalker , who verbally acknowledged these results. Electronically Signed   By: Anner Crete M.D.   On: 03/26/2017 23:32   Ct Abdomen Pelvis W Contrast  Result Date: 03/26/2017 CLINICAL DATA:  84 year old male with abdominal trauma. Generalized soreness secondary to fall. EXAM: CT CHEST, ABDOMEN, AND PELVIS WITH CONTRAST TECHNIQUE: Multidetector CT imaging of the chest, abdomen and pelvis was performed following the standard protocol during bolus administration of intravenous contrast. CONTRAST:  4m ISOVUE-300 IOPAMIDOL (ISOVUE-300) INJECTION 61% COMPARISON:  Chest radiograph dated 03/25/2017 and CT of the abdomen pelvis dated 02/06/2017 FINDINGS: CT CHEST FINDINGS Cardiovascular: Mild cardiomegaly with dilatation of the right atrium. No pericardial effusion. Postsurgical changes and epicardial wires noted. Left pectoral pacemaker device. There is moderate atherosclerotic calcification of the thoracic aorta. No aneurysmal dilatation or dissection. There is thickened appearance of the wall of the left brachiocephalic trunk. There is partially occlusive thrombus in the subsegmental right lower lobe pulmonary artery branch which may represent residual thrombus prior PE or scarring with acute nonocclusive thrombus is less likely but not excluded. Correlation with history of previous PE recommended. No large or occlusive pulmonary artery embolus identified. Mediastinum/Nodes: No hilar or mediastinal adenopathy. The esophagus is grossly unremarkable. Bilateral hypodense thyroid nodules measure up to 3.7 cm in the inferior pole of the left thyroid gland. Lungs/Pleura: There is emphysematous changes of the lungs. Postsurgical changes of resection of the previously seen right upper lobe mass.  Right lung base subpleural rounded atelectasis or scarring. No new consolidative changes. Small chronic right pleural effusion. No pneumothorax. The central airways are patent. Musculoskeletal: There is osteopenia with degenerative changes of the spine. No acute osseous pathology. Median sternotomy wires noted. CT ABDOMEN PELVIS FINDINGS No intra-abdominal free air or free fluid. Hepatobiliary: Cholecystectomy. Focal area of hypodensity along the falciform ligament in the left lobe of the liver similar to prior CT and may represent focal fatty infiltration. A smaller subcentimeter hypodense lesion in the inferior aspect of the right lobe of the liver (series 3 image 489) is not well characterized. No intrahepatic biliary ductal dilatation. The gallbladder is surgically absent. Pancreas: Unremarkable.  No pancreatic ductal dilatation or surrounding inflammatory changes. Spleen: Dystrophic calcification of the spleen as before. Adrenals/Urinary Tract: Subcentimeter left adrenal adenoma. The right adrenal gland is unremarkable. Probable 1 cm right renal inferior pole cyst. Right renal upper pole subcentimeter hypodensity is not characterized. There is no hydronephrosis on either side. There is symmetric enhancement and excretion of contrast by both kidneys. The visualized ureters and urinary bladder appear unremarkable. Stomach/Bowel: There is sigmoid diverticulosis without active inflammatory changes. No bowel obstruction or active inflammation. Normal appendix. Vascular/Lymphatic: Moderate aortoiliac atherosclerotic disease. The origins of the celiac axis, SMA, IMA are patent. No portal venous gas. There is no adenopathy. Reproductive: The prostate is grossly unremarkable. Other: Epicardial wires noted in the left anterior abdominal wall Musculoskeletal: Degenerative changes of the spine and hips. Large anterior osteophyte at L2-L3 abutting the posterior aorta. No acute osseous pathology. IMPRESSION: 1. No  acute/traumatic intrathoracic, abdominal, or pelvic pathology. 2. Nonocclusive pulmonary embolus in the subsegmental right lower lobe pulmonary artery branches likely old PE or scarring. Nonocclusive acute PE is not entirely excluded. Clinical correlation is recommended. 3. Mild cardiomegaly with dilatation of the right atrium. 4. 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 Small right pleural effusion and stable right lung base round atelectasis/scarring. 5. Aortic Atherosclerosis (ICD10-I70.0) and Emphysema (ICD10-J43.9). These results were called by telephone at the time of interpretation on 03/26/2017 at 11:27 pm to Dr. Marda Stalker , who verbally acknowledged these results. Electronically Signed   By: Anner Crete M.D.   On: 03/26/2017 23:32    Pending Labs Unresulted Labs (From admission, onward)   Start     Ordered   03/26/17 1632  Urinalysis, Routine w reflex microscopic  Once,   R     03/26/17 1634   03/26/17 1632  Urine culture  STAT,   STAT     03/26/17 1634   03/26/17 1631  CBC with Differential  STAT,   STAT     03/26/17 1634      Vitals/Pain Today's Vitals   03/26/17 2000 03/26/17 2342 03/27/17 0000 03/27/17 0100  BP: (!) 128/91 124/75 (!) 122/51 131/79  Pulse: 82 73 72 77  Resp: 17 18    Temp:      TempSrc:      SpO2: 96% 97% 97% 95%  PainSc:        Isolation Precautions No active isolations  Medications Medications  sodium chloride 0.9 % injection (not administered)  iopamidol (ISOVUE-300) 61 % injection (not administered)  mometasone-formoterol (DULERA) 200-5 MCG/ACT inhaler 2 puff (not administered)  digoxin (LANOXIN) tablet 0.125 mg (not administered)  fluticasone (FLONASE) 50 MCG/ACT nasal spray 1 spray (not administered)  furosemide (LASIX) tablet 40 mg (not administered)  gabapentin (NEURONTIN) capsule 100-300 mg (not administered)  linaclotide (LINZESS) capsule 72 mcg (not administered)  loratadine (CLARITIN) tablet 10 mg  (not administered)  metoprolol succinate (TOPROL-XL) 24 hr tablet 25 mg (not administered)  potassium chloride (K-DUR,KLOR-CON) CR tablet 10 mEq (not administered)  polyethylene glycol (MIRALAX / GLYCOLAX) packet 17 g (not administered)  traMADol (ULTRAM) tablet 50 mg (not administered)  cycloSPORINE (RESTASIS) 0.05 % ophthalmic emulsion 1 drop (not administered)  famotidine (PEPCID) tablet 20 mg (not administered)  calcium carbonate (TUMS - dosed in mg elemental calcium) chewable tablet 200 mg of elemental calcium (not administered)  acetaminophen (TYLENOL) tablet 650 mg (not administered)    Or  acetaminophen (TYLENOL) suppository 650 mg (not administered)  ondansetron (ZOFRAN) tablet 4 mg (not administered)  Or  ondansetron (ZOFRAN) injection 4 mg (not administered)  iopamidol (ISOVUE-300) 61 % injection (75 mLs  Contrast Given 03/26/17 2024)    Mobility Walks with device 

## 2017-03-27 NOTE — Progress Notes (Signed)
PROGRESS NOTE    [name]     PCP: [name], MD                    DOB: December 31, 1932            DOA: 03/26/2017 OQH:[id number]             DOS: 03/27/2017, 2:14 PM   LOS: 0 days   Date of Service: The patient was seen and examined on 03/27/2017  Subjective:  Morning, stable no acute distress.  Not complaining of chest pain or shortness of breath.  ----------------------------------------------------------------------------------------------------------------------  Brief Narrative:   [name] is a 84 y.o. male with medical history significant of a.fib, AICD placement, CAD.  Patient presents to the ED with c/o pain in upper abd and lower chest that onset after a fall.  He states he lost his balance and fell, landing on top of his cane. The cane struck him in the chest.   Principal Problem:   Pulmonary embolism (HCC) Active Problems:   Chronic systolic heart failure- EF 35-40%   Automatic implantable cardioverter-defibrillator in situ   Chronic anticoagulation discontinued July 2013 after developed subdural hematoma   Assessment & Plan:  PE - chronic vs acute, small-agreed with the findings most likely chronic, no acute distress Discussed with Dr. Oletta Darter: Probably chronic DVTs we suspect, treated bilateral lower extremity Dopplers were negative for DVT  2D Echo - pending  This patient does not have a DVT and PE seems to be chronic were reluctant to start any chronic anticoagulant therapy or IVC filter placement given the risks and benefits. Patient may be seen by his cardiologist and PCP to determine if a candidate for chronic anticoagulation in the near future  Chronic systolic CHF - continue home meds  H/o A.fib, AICD placement, CAD.  HTN - his blood pressure is minimally elevated. He will maintain a low sodium diet.   Fall/weakness PT/OT will be consulted for evaluation and recommendations Social worker to assist with safe discharge  DVT  prophylaxis: SCD/ Code Status: Full Family Communication: No family in room Disposition Plan:  If asymptomatic in a.m., 2D echo at baseline may be discharged to home Consults called:  PT/OT/social worker Admission status: Place in obs   Procedures:    CT of the chest bilateral lower extremity Doppler study  Antimicrobials:  Anti-infectives (From admission, onward)   None       Objective: Vitals:   03/27/17 0201 03/27/17 0236 03/27/17 1141 03/27/17 1306  BP: 131/79 (!) 144/84  123/76  Pulse: 91 87  77  Resp: 18 18  18   Temp: 97.7 F (36.5 C) 97.6 F (36.4 C)  97.7 F (36.5 C)  TempSrc: Oral Oral  Oral  SpO2: 98% 97% 98% 97%    Intake/Output Summary (Last 24 hours) at 03/27/2017 1414 Last data filed at 03/27/2017 1235 Gross per 24 hour  Intake 50 ml  Output -  Net 50 ml   There were no vitals filed for this visit.  Examination:  General exam: Appears calm and comfortable  Psychiatry: Judgement and insight appear normal. Mood & affect appropriate. HEENT: WNLs Respiratory system: Clear to auscultation. Respiratory effort normal. Cardiovascular system: S1 & S2 heard, RRR. No JVD, murmurs, rubs, gallops or clicks. No pedal edema. Gastrointestinal system: Abd. nondistended, soft and nontender. No organomegaly or masses felt. Normal bowel sounds heard. Central nervous system: Alert and oriented. No focal neurological deficits. Extremities: Symmetric 5 x  5 power.  Generalized weakness is noted Skin: No rashes, lesions or ulcers Wounds:   Data Reviewed: I have personally reviewed following labs and imaging studies  CBC: Recent Labs  Lab 03/23/17 0622 03/24/17 1540  WBC 7.1 7.3  NEUTROABS 3.7  --   HGB 13.7 14.1  HCT 41.9 42.0  MCV 92.7 90.9  PLT 176 267   Basic Metabolic Panel: Recent Labs  Lab 03/23/17 0622 03/24/17 1540 03/26/17 1858  NA 138 135 139  K 3.8 3.7 3.8  CL 102 100* 99*  CO2 21* 20* 24  GLUCOSE 145* 117* 101*  BUN 19 22* 24*    CREATININE 1.47* 1.44* 1.32*  CALCIUM 9.1 9.1 9.5   GFR: Estimated Creatinine Clearance: 47.4 mL/min (A) (by C-G formula based on SCr of 1.32 mg/dL (H)). Liver Function Tests: Recent Labs  Lab 03/23/17 0622 03/24/17 1540 03/26/17 1858  AST 22 23 23   ALT 10* 9* 10*  ALKPHOS 64 61 62  BILITOT 1.3* 1.9* 1.7*  PROT 7.0 7.0 7.0  ALBUMIN 4.1 4.0 4.2   Recent Labs  Lab 03/23/17 0622 03/24/17 1540 03/26/17 1858  LIPASE 27 26 28    No results for input(s): AMMONIA in the last 168 hours. Coagulation Profile: Recent Labs  Lab 03/26/17 1720  INR 1.48   Cardiac Enzymes: Recent Labs  Lab 03/23/17 0622 03/23/17 1012  TROPONINI <0.03 0.03*  Sepsis Labs: Recent Labs  Lab 03/26/17 1736 03/26/17 1909  LATICACIDVEN 1.18 1.09    No results found for this or any previous visit (from the past 240 hour(s)).    Radiology Studies: Ct Head Wo Contrast  Result Date: 03/26/2017 CLINICAL DATA:  Fall today. Posttraumatic headache. Initial encounter. Personal history of subdural hematoma. EXAM: CT HEAD WITHOUT CONTRAST TECHNIQUE: Contiguous axial images were obtained from the base of the skull through the vertex without intravenous contrast. COMPARISON:  03/24/2015 FINDINGS: Brain: No evidence of acute infarction, hemorrhage, hydrocephalus, extra-axial collection, or mass lesion/mass effect. Stable mild cerebral atrophy and chronic small vessel disease. Vascular:  No hyperdense vessel or other acute findings. Skull: No evidence of fracture or other significant bone abnormality. Sinuses/Orbits:  No acute findings. Other: None. IMPRESSION: No acute intracranial abnormality. Stable mild cerebral atrophy and chronic small vessel disease. Electronically Signed   By: Earle Gell M.D.   On: 03/26/2017 20:52   Ct Chest W Contrast  Result Date: 03/26/2017 CLINICAL DATA:  84 year old male with abdominal trauma. Generalized soreness secondary to fall. EXAM: CT CHEST, ABDOMEN, AND PELVIS WITH CONTRAST  TECHNIQUE: Multidetector CT imaging of the chest, abdomen and pelvis was performed following the standard protocol during bolus administration of intravenous contrast. CONTRAST:  56mL ISOVUE-300 IOPAMIDOL (ISOVUE-300) INJECTION 61% COMPARISON:  Chest radiograph dated 03/25/2017 and CT of the abdomen pelvis dated 02/06/2017 FINDINGS: CT CHEST FINDINGS Cardiovascular: Mild cardiomegaly with dilatation of the right atrium. No pericardial effusion. Postsurgical changes and epicardial wires noted. Left pectoral pacemaker device. There is moderate atherosclerotic calcification of the thoracic aorta. No aneurysmal dilatation or dissection. There is thickened appearance of the wall of the left brachiocephalic trunk. There is partially occlusive thrombus in the subsegmental right lower lobe pulmonary artery branch which may represent residual thrombus prior PE or scarring with acute nonocclusive thrombus is less likely but not excluded. Correlation with history of previous PE recommended. No large or occlusive pulmonary artery embolus identified. Mediastinum/Nodes: No hilar or mediastinal adenopathy. The esophagus is grossly unremarkable. Bilateral hypodense thyroid nodules measure up to 3.7 cm in the inferior  pole of the left thyroid gland. Lungs/Pleura: There is emphysematous changes of the lungs. Postsurgical changes of resection of the previously seen right upper lobe mass. Right lung base subpleural rounded atelectasis or scarring. No new consolidative changes. Small chronic right pleural effusion. No pneumothorax. The central airways are patent. Musculoskeletal: There is osteopenia with degenerative changes of the spine. No acute osseous pathology. Median sternotomy wires noted. CT ABDOMEN PELVIS FINDINGS No intra-abdominal free air or free fluid. Hepatobiliary: Cholecystectomy. Focal area of hypodensity along the falciform ligament in the left lobe of the liver similar to prior CT and may represent focal fatty  infiltration. A smaller subcentimeter hypodense lesion in the inferior aspect of the right lobe of the liver (series 3 image 489) is not well characterized. No intrahepatic biliary ductal dilatation. The gallbladder is surgically absent. Pancreas: Unremarkable. No pancreatic ductal dilatation or surrounding inflammatory changes. Spleen: Dystrophic calcification of the spleen as before. Adrenals/Urinary Tract: Subcentimeter left adrenal adenoma. The right adrenal gland is unremarkable. Probable 1 cm right renal inferior pole cyst. Right renal upper pole subcentimeter hypodensity is not characterized. There is no hydronephrosis on either side. There is symmetric enhancement and excretion of contrast by both kidneys. The visualized ureters and urinary bladder appear unremarkable. Stomach/Bowel: There is sigmoid diverticulosis without active inflammatory changes. No bowel obstruction or active inflammation. Normal appendix. Vascular/Lymphatic: Moderate aortoiliac atherosclerotic disease. The origins of the celiac axis, SMA, IMA are patent. No portal venous gas. There is no adenopathy. Reproductive: The prostate is grossly unremarkable. Other: Epicardial wires noted in the left anterior abdominal wall Musculoskeletal: Degenerative changes of the spine and hips. Large anterior osteophyte at L2-L3 abutting the posterior aorta. No acute osseous pathology. IMPRESSION: 1. No acute/traumatic intrathoracic, abdominal, or pelvic pathology. 2. Nonocclusive pulmonary embolus in the subsegmental right lower lobe pulmonary artery branches likely old PE or scarring. Nonocclusive acute PE is not entirely excluded. Clinical correlation is recommended. 3. Mild cardiomegaly with dilatation of the right atrium. 4. 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 Small right pleural effusion and stable right lung base round atelectasis/scarring. 5. Aortic Atherosclerosis (ICD10-I70.0) and Emphysema (ICD10-J43.9). These  results were called by telephone at the time of interpretation on 03/26/2017 at 11:27 pm to Dr. Marda Stalker , who verbally acknowledged these results. Electronically Signed   By: Anner Crete M.D.   On: 03/26/2017 23:32   Ct Abdomen Pelvis W Contrast  Result Date: 03/26/2017 CLINICAL DATA:  84 year old male with abdominal trauma. Generalized soreness secondary to fall. EXAM: CT CHEST, ABDOMEN, AND PELVIS WITH CONTRAST TECHNIQUE: Multidetector CT imaging of the chest, abdomen and pelvis was performed following the standard protocol during bolus administration of intravenous contrast. CONTRAST:  45mL ISOVUE-300 IOPAMIDOL (ISOVUE-300) INJECTION 61% COMPARISON:  Chest radiograph dated 03/25/2017 and CT of the abdomen pelvis dated 02/06/2017 FINDINGS: CT CHEST FINDINGS Cardiovascular: Mild cardiomegaly with dilatation of the right atrium. No pericardial effusion. Postsurgical changes and epicardial wires noted. Left pectoral pacemaker device. There is moderate atherosclerotic calcification of the thoracic aorta. No aneurysmal dilatation or dissection. There is thickened appearance of the wall of the left brachiocephalic trunk. There is partially occlusive thrombus in the subsegmental right lower lobe pulmonary artery branch which may represent residual thrombus prior PE or scarring with acute nonocclusive thrombus is less likely but not excluded. Correlation with history of previous PE recommended. No large or occlusive pulmonary artery embolus identified. Mediastinum/Nodes: No hilar or mediastinal adenopathy. The esophagus is grossly unremarkable. Bilateral hypodense thyroid nodules measure  up to 3.7 cm in the inferior pole of the left thyroid gland. Lungs/Pleura: There is emphysematous changes of the lungs. Postsurgical changes of resection of the previously seen right upper lobe mass. Right lung base subpleural rounded atelectasis or scarring. No new consolidative changes. Small chronic right pleural  effusion. No pneumothorax. The central airways are patent. Musculoskeletal: There is osteopenia with degenerative changes of the spine. No acute osseous pathology. Median sternotomy wires noted. CT ABDOMEN PELVIS FINDINGS No intra-abdominal free air or free fluid. Hepatobiliary: Cholecystectomy. Focal area of hypodensity along the falciform ligament in the left lobe of the liver similar to prior CT and may represent focal fatty infiltration. A smaller subcentimeter hypodense lesion in the inferior aspect of the right lobe of the liver (series 3 image 489) is not well characterized. No intrahepatic biliary ductal dilatation. The gallbladder is surgically absent. Pancreas: Unremarkable. No pancreatic ductal dilatation or surrounding inflammatory changes. Spleen: Dystrophic calcification of the spleen as before. Adrenals/Urinary Tract: Subcentimeter left adrenal adenoma. The right adrenal gland is unremarkable. Probable 1 cm right renal inferior pole cyst. Right renal upper pole subcentimeter hypodensity is not characterized. There is no hydronephrosis on either side. There is symmetric enhancement and excretion of contrast by both kidneys. The visualized ureters and urinary bladder appear unremarkable. Stomach/Bowel: There is sigmoid diverticulosis without active inflammatory changes. No bowel obstruction or active inflammation. Normal appendix. Vascular/Lymphatic: Moderate aortoiliac atherosclerotic disease. The origins of the celiac axis, SMA, IMA are patent. No portal venous gas. There is no adenopathy. Reproductive: The prostate is grossly unremarkable. Other: Epicardial wires noted in the left anterior abdominal wall Musculoskeletal: Degenerative changes of the spine and hips. Large anterior osteophyte at L2-L3 abutting the posterior aorta. No acute osseous pathology. IMPRESSION: 1. No acute/traumatic intrathoracic, abdominal, or pelvic pathology. 2. Nonocclusive pulmonary embolus in the subsegmental right lower  lobe pulmonary artery branches likely old PE or scarring. Nonocclusive acute PE is not entirely excluded. Clinical correlation is recommended. 3. Mild cardiomegaly with dilatation of the right atrium. 4. 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 Small right pleural effusion and stable right lung base round atelectasis/scarring. 5. Aortic Atherosclerosis (ICD10-I70.0) and Emphysema (ICD10-J43.9). These results were called by telephone at the time of interpretation on 03/26/2017 at 11:27 pm to Dr. Marda Stalker , who verbally acknowledged these results. Electronically Signed   By: Anner Crete M.D.   On: 03/26/2017 23:32    Scheduled Meds: . cycloSPORINE  1 drop Both Eyes BID  . digoxin  0.125 mg Oral Daily  . famotidine  20 mg Oral BID  . furosemide  40 mg Oral Daily  . gabapentin  100-300 mg Oral BID  . metoprolol succinate  25 mg Oral Daily  . mometasone-formoterol  2 puff Inhalation BID  . potassium chloride  10 mEq Oral Daily   Continuous Infusions:  Time spent: >25 minutes  Deatra James, MD [facility],  Pager [phone]  If 7PM-7AM, please contact night-coverage [email or web address]   Password [facility]  03/27/2017, 2:14 PM

## 2017-03-27 NOTE — Progress Notes (Signed)
Bilateral lower extremity venous duplex has been completed. Negative for DVT.  03/27/17 9:13 AM [name] RVT

## 2017-03-27 NOTE — H&P (Signed)
History and Physical    [name] BDZ:[id number] DOB: 21-Feb-1932 DOA: 03/26/2017  PCP: [name], MD  Patient coming from: Home  I have personally briefly reviewed patient's old medical records in [place]  Chief Complaint: Abd pain / chest pain  HPI: [name] is a 84 y.o. male with medical history significant of a.fib, AICD placement, CAD.  Patient presents to the ED with c/o pain in upper abd and lower chest that onset after a fall.  He states he lost his balance and fell, landing on top of his cane.  The cane struck him in the chest.  He was seen in the ED yesterday, discharged, but returned today with nausea and dry heaving and worse pain in severity.   ED Course: For reasons that arnt entirely clear to me still.  The EDP elected (among other work up) to obtain CTA chest for PE.  CTA chest does show BLL PEs but these seem to be more chronic radiology suspects but acute PE cant be ruled out.  No evidence of R heart strain.  EDP spoke with pulmonology, they agreed that the patient likely need admission to have further discussions of anticoagulation and for further observation of his symptoms.   Review of Systems: As per HPI otherwise 10 point review of systems negative.   Past Medical History:  Diagnosis Date  . Abnormal thyroid scan    Abnormal thyroid imaging studies from 11/09/2010, status post ultrasound guided fine needle aspiration of the dominant left inferior thyroid nodule on 12/15/2010. Cytology report showed rare follicular epithelial cells and hemosiderin laden macrophages.  Marland Kitchen AICD (automatic cardioverter/defibrillator) present    a. fx lead; a. s/p lead extraction 03/02/17  . Arthritis    "all over"  . Asthma   . Atrial fibrillation ([place])    on chronic Coumadin; stopped July 2013 due to subdural hematomas  . CHF (congestive heart failure) (HCC)    EF 35-40% s/p most recent ICD generator change-out with Medtronic dual-chamber ICD 05/20/11  with explantation of previous abdominally-implanted device  . Coronary artery disease    s/p CABG 1983 and PCI/stent 2004.   . Diabetes mellitus    diet controlled  . Dyslipidemia   . Erythrocytosis   . GERD (gastroesophageal reflux disease)   . Hypertension   . Ischemic cardiomyopathy    WITH CHF  . Monocytosis 04/17/2013  . Myocardial infarction ([place]) 1983; ~ 1990  . Pneumonia August 2013  . Subdural hematoma [facility]) July 2013   Anticoagulation stopped.   . SunDown syndrome   . VT (ventricular tachycardia) ([place])     Past Surgical History:  Procedure Laterality Date  . CHOLECYSTECTOMY    . COLONOSCOPY  07/07/2011   Procedure: COLONOSCOPY;  Surgeon: Jerene Bears, MD;  Location: [facility] ENDOSCOPY;  Service: Gastroenterology;  Laterality: N/A;  . CORONARY ANGIOPLASTY WITH STENT PLACEMENT  2004   Tandem Cypher stents LAD  . CORONARY ARTERY BYPASS GRAFT  1983   SVG-mLAD  . ESOPHAGOGASTRODUODENOSCOPY  02/11/2011   Procedure: ESOPHAGOGASTRODUODENOSCOPY (EGD);  Surgeon: Beryle Beams, MD;  Location: [facility];  Service: Endoscopy;  Laterality: N/A;  . FLEXIBLE SIGMOIDOSCOPY N/A 02/08/2017   Procedure: FLEXIBLE SIGMOIDOSCOPY;  Surgeon: Irene Shipper, MD;  Location: [facility];  Service: Endoscopy;  Laterality: N/A;  . ICD GENERATOR CHANGEOUT N/A 04/08/2016   Procedure: ICD Generator Changeout;  Surgeon: Evans Lance, MD;  Location: [place];  Service: Cardiovascular;  Laterality: N/A;  .  ICD LEAD REMOVAL N/A 03/02/2017   Procedure: ICD LEAD REMOVAL;  Surgeon: Evans Lance, MD;  Location: [place];  Service: Cardiovascular;  Laterality: N/A;  . IMPLANTABLE CARDIOVERTER DEFIBRILLATOR (ICD) GENERATOR CHANGE N/A 05/20/2011   Procedure: ICD GENERATOR CHANGE;  Surgeon: Evans Lance, MD;  Medtronic secure dual-chamber ICD serial number [id number]   . KNEE ARTHROSCOPY     right; "just went in and scraped it"  . LEAD INSERTION N/A 03/02/2017   Procedure: LEAD INSERTION;  Surgeon: Evans Lance, MD;  Location: [place];  Service: Cardiovascular;  Laterality: N/A;  . MASS EXCISION Right 05/10/2013   Procedure: EXCISION MASS RIGHT THUMB;  Surgeon: Wynonia Sours, MD;  Location: [place];  Service: Orthopedics;  Laterality: Right;  . PROXIMAL INTERPHALANGEAL FUSION (PIP) Right 05/10/2013   Procedure: DEBRIDEMENT PROXIMAL INTERPHALANGEAL FUSION (PIP);  Surgeon: Wynonia Sours, MD;  Location: [place];  Service: Orthopedics;  Laterality: Right;  . TONSILLECTOMY           reports that he quit smoking about 40 years ago. His smoking use included cigarettes. He has a 60.00 pack-year smoking history. he has never used smokeless tobacco. He reports that he does not drink alcohol or use drugs.  Allergies  Allergen Reactions  . Tamsulosin Other (See Comments)    Dizziness, Made BP very low and weakness  . Celebrex [Celecoxib] Hives and Nausea And Vomiting    Gi upset  . Dronedarone Nausea And Vomiting and Other (See Comments)    GI upset, abdominal pain  . Esomeprazole Magnesium Hives and Other (See Comments)    "don't really remember"  . Digoxin Diarrhea    May have caused some diarrhea  . Hydrocodone-Acetaminophen Other (See Comments)    Bad headache  . Protonix [Pantoprazole Sodium] Nausea And Vomiting and Other (See Comments)    Tolerates Dexilant    Family History  Problem Relation Age of Onset  . Tuberculosis Mother   . Tuberculosis Father   . Heart disease Brother   . Diabetes Sister   . Diabetes Brother   . Clotting disorder Brother      Prior to Admission medications   Medication Sig Start Date End Date Taking? Authorizing Provider  ADVAIR DISKUS 250-50 MCG/DOSE AEPB Inhale 1 puff into the lungs 2 (two) times daily.  11/13/14  Yes [provider]  calcium carbonate (TUMS - DOSED IN MG ELEMENTAL CALCIUM) 500 MG chewable tablet Chew 1 tablet by mouth daily as needed for heartburn.    Yes [provider]  digoxin  (LANOXIN) 0.125 MG tablet Take 1 tablet (0.125 mg total) by mouth daily. 02/10/17  Yes Short, Noah Delaine, MD  fluticasone (FLONASE) 50 MCG/ACT nasal spray Place 2 sprays into both nostrils daily. Patient taking differently: Place 1 spray into both nostrils daily as needed for allergies.  01/04/17  Yes [name], MD  furosemide (LASIX) 40 MG tablet Take 1 tablet (40 mg total) by mouth daily. May take an extra tab daily as needed for swelling 01/04/17  Yes [name], MD  gabapentin (NEURONTIN) 100 MG capsule TAKE 1 CAPSULE (100MG ) TO 3 CAPSULES (300MG ) EACH NIGHT AS NEEDED FOR MUSCLE PAINS 10/05/16  Yes [name], MD  LINZESS 72 MCG capsule Take 72 mcg by mouth daily as needed (for constipation).  03/07/16  Yes [provider]  loratadine (CLARITIN) 10 MG tablet Take 10 mg by mouth daily as needed for allergies.    Yes  [provider]  metoprolol succinate (TOPROL-XL) 25 MG 24 hr tablet Take 1 tablet (25 mg total) by mouth daily. 02/10/17  Yes Short, Noah Delaine, MD  nitroGLYCERIN (NITROSTAT) 0.4 MG SL tablet PLACE ONE UNDER TONGUE FOR CHEST PAIN. Patient taking differently: Place 0.4 mg under tongue as needed for chest pain 11/22/16  Yes [name], Peter M, MD  polyethylene glycol [facility] / Floria Raveling) packet Take 17 g by mouth daily as needed for mild constipation. 02/10/17  Yes Short, Noah Delaine, MD  potassium chloride (K-DUR,KLOR-CON) 10 MEQ tablet Take 1 tablet (10 mEq total) by mouth daily. 01/09/13  Yes [name], Peter M, MD  ranitidine (ZANTAC) 150 MG tablet Take 1 tablet (150 mg total) 2 (two) times daily by mouth. 12/20/16  Yes [name], MD  RESTASIS 0.05 % ophthalmic emulsion INSTILL 1 DROP INTO BOTH EYES TWICE A DAY 11/17/16  Yes [name], MD  traMADol (ULTRAM) 50 MG tablet TAKE 2 TABLETS BY MOUTH EVERY 8 HOURS AS NEEDED FOR PAIN 03/24/17  Yes [name], MD    Physical Exam: Vitals:   03/26/17 1845 03/26/17 2000 03/26/17 2342 03/27/17 0000  BP:  (!) 128/91  124/75 (!) 122/51  Pulse: 87 82 73 72  Resp: 14 17 18    Temp:      TempSrc:      SpO2: 97% 96% 97% 97%    Constitutional: NAD, calm, comfortable Eyes: PERRL, lids and conjunctivae normal ENMT: Mucous membranes are moist. Posterior pharynx clear of any exudate or lesions.Normal dentition.  Neck: normal, supple, no masses, no thyromegaly Respiratory: clear to auscultation bilaterally, no wheezing, no crackles. Normal respiratory effort. No accessory muscle use.  Cardiovascular: Regular rate and rhythm, no murmurs / rubs / gallops. No extremity edema. 2+ pedal pulses. No carotid bruits.  Abdomen: no tenderness, no masses palpated. No hepatosplenomegaly. Bowel sounds positive.  Musculoskeletal: no clubbing / cyanosis. No joint deformity upper and lower extremities. Good ROM, no contractures. Normal muscle tone.  Skin: no rashes, lesions, ulcers. No induration Neurologic: CN 2-12 grossly intact. Sensation intact, DTR normal. Strength 5/5 in all 4.  Psychiatric: Normal judgment and insight. Alert and oriented x 3. Normal mood.    Labs on Admission: I have personally reviewed following labs and imaging studies  CBC: Recent Labs  Lab 03/23/17 0622 03/24/17 1540  WBC 7.1 7.3  NEUTROABS 3.7  --   HGB 13.7 14.1  HCT 41.9 42.0  MCV 92.7 90.9  PLT 176 694   Basic Metabolic Panel: Recent Labs  Lab 03/23/17 0622 03/24/17 1540 03/26/17 1858  NA 138 135 139  K 3.8 3.7 3.8  CL 102 100* 99*  CO2 21* 20* 24  GLUCOSE 145* 117* 101*  BUN 19 22* 24*  CREATININE 1.47* 1.44* 1.32*  CALCIUM 9.1 9.1 9.5   GFR: Estimated Creatinine Clearance: 47.4 mL/min (A) (by C-G formula based on SCr of 1.32 mg/dL (H)). Liver Function Tests: Recent Labs  Lab 03/23/17 0622 03/24/17 1540 03/26/17 1858  AST 22 23 23   ALT 10* 9* 10*  ALKPHOS 64 61 62  BILITOT 1.3* 1.9* 1.7*  PROT 7.0 7.0 7.0  ALBUMIN 4.1 4.0 4.2   Recent Labs  Lab 03/23/17 0622 03/24/17 1540 03/26/17 1858  LIPASE 27 26 28     No results for input(s): AMMONIA in the last 168 hours. Coagulation Profile: Recent Labs  Lab 03/26/17 1720  INR 1.48   Cardiac Enzymes: Recent Labs  Lab 03/23/17 0622 03/23/17 1012  TROPONINI <0.03 0.03*  BNP (last 3 results) No results for input(s): PROBNP in the last 8760 hours. HbA1C: No results for input(s): HGBA1C in the last 72 hours. CBG: No results for input(s): GLUCAP in the last 168 hours. Lipid Profile: No results for input(s): CHOL, HDL, LDLCALC, TRIG, CHOLHDL, LDLDIRECT in the last 72 hours. Thyroid Function Tests: No results for input(s): TSH, T4TOTAL, FREET4, T3FREE, THYROIDAB in the last 72 hours. Anemia Panel: No results for input(s): VITAMINB12, FOLATE, FERRITIN, TIBC, IRON, RETICCTPCT in the last 72 hours. Urine analysis:    Component Value Date/Time   COLORURINE YELLOW 02/06/2017 1128   APPEARANCEUR HAZY (A) 02/06/2017 1128   LABSPEC 1.018 02/06/2017 1128   PHURINE 5.0 02/06/2017 1128   GLUCOSEU NEGATIVE 02/06/2017 1128   HGBUR SMALL (A) 02/06/2017 1128   BILIRUBINUR NEGATIVE 02/06/2017 1128   KETONESUR 20 (A) 02/06/2017 1128   PROTEINUR 30 (A) 02/06/2017 1128   UROBILINOGEN 0.2 09/09/2012 1501   NITRITE NEGATIVE 02/06/2017 1128   LEUKOCYTESUR MODERATE (A) 02/06/2017 1128    Radiological Exams on Admission: Dg Chest 2 View  Result Date: 03/25/2017 CLINICAL DATA:  Fall with left-sided pain EXAM: CHEST  2 VIEW COMPARISON:  03/23/2017, 03/03/2017, 12/23/2016 FINDINGS: Post sternotomy changes. Left-sided pacing device as before. Hyperinflation with tiny pleural effusion or pleural scarring. Multiple epicardial leads. No consolidation. No pneumothorax. Stable cardiomediastinal silhouette with aortic atherosclerosis. Degenerative changes of the spine IMPRESSION: No active cardiopulmonary disease. Stable tiny pleural effusions or pleural thickening. Hyperinflation. Electronically Signed   By: Donavan Foil M.D.   On: 03/25/2017 02:43   Ct Head Wo  Contrast  Result Date: 03/26/2017 CLINICAL DATA:  Fall today. Posttraumatic headache. Initial encounter. Personal history of subdural hematoma. EXAM: CT HEAD WITHOUT CONTRAST TECHNIQUE: Contiguous axial images were obtained from the base of the skull through the vertex without intravenous contrast. COMPARISON:  03/24/2015 FINDINGS: Brain: No evidence of acute infarction, hemorrhage, hydrocephalus, extra-axial collection, or mass lesion/mass effect. Stable mild cerebral atrophy and chronic small vessel disease. Vascular:  No hyperdense vessel or other acute findings. Skull: No evidence of fracture or other significant bone abnormality. Sinuses/Orbits:  No acute findings. Other: None. IMPRESSION: No acute intracranial abnormality. Stable mild cerebral atrophy and chronic small vessel disease. Electronically Signed   By: Earle Gell M.D.   On: 03/26/2017 20:52   Ct Chest W Contrast  Result Date: 03/26/2017 CLINICAL DATA:  84 year old male with abdominal trauma. Generalized soreness secondary to fall. EXAM: CT CHEST, ABDOMEN, AND PELVIS WITH CONTRAST TECHNIQUE: Multidetector CT imaging of the chest, abdomen and pelvis was performed following the standard protocol during bolus administration of intravenous contrast. CONTRAST:  53mL ISOVUE-300 IOPAMIDOL (ISOVUE-300) INJECTION 61% COMPARISON:  Chest radiograph dated 03/25/2017 and CT of the abdomen pelvis dated 02/06/2017 FINDINGS: CT CHEST FINDINGS Cardiovascular: Mild cardiomegaly with dilatation of the right atrium. No pericardial effusion. Postsurgical changes and epicardial wires noted. Left pectoral pacemaker device. There is moderate atherosclerotic calcification of the thoracic aorta. No aneurysmal dilatation or dissection. There is thickened appearance of the wall of the left brachiocephalic trunk. There is partially occlusive thrombus in the subsegmental right lower lobe pulmonary artery branch which may represent residual thrombus prior PE or scarring with  acute nonocclusive thrombus is less likely but not excluded. Correlation with history of previous PE recommended. No large or occlusive pulmonary artery embolus identified. Mediastinum/Nodes: No hilar or mediastinal adenopathy. The esophagus is grossly unremarkable. Bilateral hypodense thyroid nodules measure up to 3.7 cm in the inferior pole of the left thyroid gland.  Lungs/Pleura: There is emphysematous changes of the lungs. Postsurgical changes of resection of the previously seen right upper lobe mass. Right lung base subpleural rounded atelectasis or scarring. No new consolidative changes. Small chronic right pleural effusion. No pneumothorax. The central airways are patent. Musculoskeletal: There is osteopenia with degenerative changes of the spine. No acute osseous pathology. Median sternotomy wires noted. CT ABDOMEN PELVIS FINDINGS No intra-abdominal free air or free fluid. Hepatobiliary: Cholecystectomy. Focal area of hypodensity along the falciform ligament in the left lobe of the liver similar to prior CT and may represent focal fatty infiltration. A smaller subcentimeter hypodense lesion in the inferior aspect of the right lobe of the liver (series 3 image 489) is not well characterized. No intrahepatic biliary ductal dilatation. The gallbladder is surgically absent. Pancreas: Unremarkable. No pancreatic ductal dilatation or surrounding inflammatory changes. Spleen: Dystrophic calcification of the spleen as before. Adrenals/Urinary Tract: Subcentimeter left adrenal adenoma. The right adrenal gland is unremarkable. Probable 1 cm right renal inferior pole cyst. Right renal upper pole subcentimeter hypodensity is not characterized. There is no hydronephrosis on either side. There is symmetric enhancement and excretion of contrast by both kidneys. The visualized ureters and urinary bladder appear unremarkable. Stomach/Bowel: There is sigmoid diverticulosis without active inflammatory changes. No bowel  obstruction or active inflammation. Normal appendix. Vascular/Lymphatic: Moderate aortoiliac atherosclerotic disease. The origins of the celiac axis, SMA, IMA are patent. No portal venous gas. There is no adenopathy. Reproductive: The prostate is grossly unremarkable. Other: Epicardial wires noted in the left anterior abdominal wall Musculoskeletal: Degenerative changes of the spine and hips. Large anterior osteophyte at L2-L3 abutting the posterior aorta. No acute osseous pathology. IMPRESSION: 1. No acute/traumatic intrathoracic, abdominal, or pelvic pathology. 2. Nonocclusive pulmonary embolus in the subsegmental right lower lobe pulmonary artery branches likely old PE or scarring. Nonocclusive acute PE is not entirely excluded. Clinical correlation is recommended. 3. Mild cardiomegaly with dilatation of the right atrium. 4. 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 Small right pleural effusion and stable right lung base round atelectasis/scarring. 5. Aortic Atherosclerosis (ICD10-I70.0) and Emphysema (ICD10-J43.9). These results were called by telephone at the time of interpretation on 03/26/2017 at 11:27 pm to Dr. Marda Stalker , who verbally acknowledged these results. Electronically Signed   By: Anner Crete M.D.   On: 03/26/2017 23:32   Ct Abdomen Pelvis W Contrast  Result Date: 03/26/2017 CLINICAL DATA:  83 year old male with abdominal trauma. Generalized soreness secondary to fall. EXAM: CT CHEST, ABDOMEN, AND PELVIS WITH CONTRAST TECHNIQUE: Multidetector CT imaging of the chest, abdomen and pelvis was performed following the standard protocol during bolus administration of intravenous contrast. CONTRAST:  46mL ISOVUE-300 IOPAMIDOL (ISOVUE-300) INJECTION 61% COMPARISON:  Chest radiograph dated 03/25/2017 and CT of the abdomen pelvis dated 02/06/2017 FINDINGS: CT CHEST FINDINGS Cardiovascular: Mild cardiomegaly with dilatation of the right atrium. No pericardial effusion.  Postsurgical changes and epicardial wires noted. Left pectoral pacemaker device. There is moderate atherosclerotic calcification of the thoracic aorta. No aneurysmal dilatation or dissection. There is thickened appearance of the wall of the left brachiocephalic trunk. There is partially occlusive thrombus in the subsegmental right lower lobe pulmonary artery branch which may represent residual thrombus prior PE or scarring with acute nonocclusive thrombus is less likely but not excluded. Correlation with history of previous PE recommended. No large or occlusive pulmonary artery embolus identified. Mediastinum/Nodes: No hilar or mediastinal adenopathy. The esophagus is grossly unremarkable. Bilateral hypodense thyroid nodules measure up to 3.7 cm in the  inferior pole of the left thyroid gland. Lungs/Pleura: There is emphysematous changes of the lungs. Postsurgical changes of resection of the previously seen right upper lobe mass. Right lung base subpleural rounded atelectasis or scarring. No new consolidative changes. Small chronic right pleural effusion. No pneumothorax. The central airways are patent. Musculoskeletal: There is osteopenia with degenerative changes of the spine. No acute osseous pathology. Median sternotomy wires noted. CT ABDOMEN PELVIS FINDINGS No intra-abdominal free air or free fluid. Hepatobiliary: Cholecystectomy. Focal area of hypodensity along the falciform ligament in the left lobe of the liver similar to prior CT and may represent focal fatty infiltration. A smaller subcentimeter hypodense lesion in the inferior aspect of the right lobe of the liver (series 3 image 489) is not well characterized. No intrahepatic biliary ductal dilatation. The gallbladder is surgically absent. Pancreas: Unremarkable. No pancreatic ductal dilatation or surrounding inflammatory changes. Spleen: Dystrophic calcification of the spleen as before. Adrenals/Urinary Tract: Subcentimeter left adrenal adenoma. The  right adrenal gland is unremarkable. Probable 1 cm right renal inferior pole cyst. Right renal upper pole subcentimeter hypodensity is not characterized. There is no hydronephrosis on either side. There is symmetric enhancement and excretion of contrast by both kidneys. The visualized ureters and urinary bladder appear unremarkable. Stomach/Bowel: There is sigmoid diverticulosis without active inflammatory changes. No bowel obstruction or active inflammation. Normal appendix. Vascular/Lymphatic: Moderate aortoiliac atherosclerotic disease. The origins of the celiac axis, SMA, IMA are patent. No portal venous gas. There is no adenopathy. Reproductive: The prostate is grossly unremarkable. Other: Epicardial wires noted in the left anterior abdominal wall Musculoskeletal: Degenerative changes of the spine and hips. Large anterior osteophyte at L2-L3 abutting the posterior aorta. No acute osseous pathology. IMPRESSION: 1. No acute/traumatic intrathoracic, abdominal, or pelvic pathology. 2. Nonocclusive pulmonary embolus in the subsegmental right lower lobe pulmonary artery branches likely old PE or scarring. Nonocclusive acute PE is not entirely excluded. Clinical correlation is recommended. 3. Mild cardiomegaly with dilatation of the right atrium. 4. 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 Small right pleural effusion and stable right lung base round atelectasis/scarring. 5. Aortic Atherosclerosis (ICD10-I70.0) and Emphysema (ICD10-J43.9). These results were called by telephone at the time of interpretation on 03/26/2017 at 11:27 pm to Dr. Marda Stalker , who verbally acknowledged these results. Electronically Signed   By: Anner Crete M.D.   On: 03/26/2017 23:32    EKG: Independently reviewed.  Assessment/Plan Principal Problem:   Pulmonary embolism (HCC) Active Problems:   Chronic systolic heart failure- EF 35-40%   Automatic implantable cardioverter-defibrillator in situ    Chronic anticoagulation discontinued July 2013 after developed subdural hematoma    1. PE - chronic vs acute, small 1. Discussed with Dr. Oletta Darter: 2. Probably chronic DVTs we suspect, but cant take chance that they arnt acute 3. 2d echo 4. BLE venous Duplex 5. If patient has DVTs, then consider IR consult for IVC filter placement in leiu of anticoagulation. 6. If no DVT then will be a tough decision on wether to re attempt anticoagulation with this patient or not, but at the very least we will be reassured that there isnt an active DVT ready to embolize and cause more severe PE. 7. Tele monitor 2. Chronic systolic CHF - continue home meds  DVT prophylaxis: None for now pending BLE [place]. Code Status: Full Family Communication: No family in room Disposition Plan: Home after admit Consults called: None Admission status: Place in obs   [place], [facility] Pager  [phone]  If 7AM-7PM, please contact day team taking care of patient [email or web address] Password [email or web address]  03/27/2017, 1:11 AM

## 2017-03-28 ENCOUNTER — Other Ambulatory Visit (HOSPITAL_COMMUNITY): Payer: Medicare Other

## 2017-03-28 ENCOUNTER — Other Ambulatory Visit: Payer: Self-pay

## 2017-03-28 DIAGNOSIS — N183 Chronic kidney disease, stage 3 (moderate): Secondary | ICD-10-CM

## 2017-03-28 DIAGNOSIS — I10 Essential (primary) hypertension: Secondary | ICD-10-CM

## 2017-03-28 DIAGNOSIS — I2782 Chronic pulmonary embolism: Principal | ICD-10-CM

## 2017-03-28 DIAGNOSIS — Z7901 Long term (current) use of anticoagulants: Secondary | ICD-10-CM

## 2017-03-28 DIAGNOSIS — Z95810 Presence of automatic (implantable) cardiac defibrillator: Secondary | ICD-10-CM

## 2017-03-28 DIAGNOSIS — I5022 Chronic systolic (congestive) heart failure: Secondary | ICD-10-CM

## 2017-03-28 LAB — URINE CULTURE: CULTURE: NO GROWTH

## 2017-03-28 MED ORDER — HALOPERIDOL LACTATE 5 MG/ML IJ SOLN
5.0000 mg | Freq: Once | INTRAMUSCULAR | Status: AC
  Administered 2017-03-28: 5 mg via INTRAMUSCULAR

## 2017-03-28 MED ORDER — HALOPERIDOL LACTATE 5 MG/ML IJ SOLN
2.0000 mg | Freq: Four times a day (QID) | INTRAMUSCULAR | Status: DC | PRN
  Administered 2017-03-29: 2 mg via INTRAMUSCULAR
  Filled 2017-03-28: qty 1

## 2017-03-28 MED ORDER — LORAZEPAM 2 MG/ML IJ SOLN: 1.0000 mg | INTRAMUSCULAR | Status: DC | PRN

## 2017-03-28 MED ORDER — HALOPERIDOL LACTATE 5 MG/ML IJ SOLN
INTRAMUSCULAR | Status: AC
  Filled 2017-03-28: qty 1

## 2017-03-28 NOTE — Progress Notes (Signed)
LCSW acknowledges CSW consult.   Patient has been seen by OT. Awaiting PT rec.   LCSW will continue to follow for disposition.   [name]

## 2017-03-28 NOTE — Progress Notes (Signed)
Patient ID: [name], male   DOB: May 20, 1932, 84 y.o.   MRN: [id number] I was called by the nurse with concerns that patient is trying to leave [place].  I came up to the floor and spoke to the patient.  He was all dressed up and ready to leave.  He was sitting outside his room.  He states that he will leave no matter what.  I tried to explain to him that he is not yet ready for discharge.  PT/OT recommended nursing home placement.  Patient apparently lives by himself.  He is currently confused and does not remember which year or month this is.  I explained to him that he is not safe to go home at this point.  He started becoming very agitated and was trying to leave.  We will have a bedside sitter.  He might need restraints if his agitation does not improve.  He cannot leave the hospital today [place].  Will order Haldol and/or Ativan if needed.  I have left message for psychiatrist on call for consultation.

## 2017-03-28 NOTE — Progress Notes (Signed)
PT Cancellation Note  Patient Details Name: [name] MRN: [id number] DOB: Jul 15, 1932   Cancelled Treatment:    Reason Eval/Treat Not Completed: Medical issues which prohibited therapy. Pt became agitated and combative. He is now in restraints. Spoke with RN who recommended PT be held for today. Will check back another day.    Weston Anna, MPT Pager: [phone]

## 2017-03-28 NOTE — Evaluation (Signed)
Occupational Therapy Evaluation Patient Details Name: [name] MRN: [id number] DOB: March 25, 1932 Today's Date: 03/28/2017    History of Present Illness [name] is a 84 y.o. male with medical history significant of a.fib, AICD placement, CAD.  Patient presents to the ED with c/o pain in upper abd and lower chest that onset after a fall.  He states he lost his balance and fell, landing on top of his cane.  The cane struck him in the chest.   Clinical Impression   Pt admitted as above currently demonstrates decreased cognition related to safety awareness, decreased awareness of deficits impacting his ability to perform ADL, self care tasks and functional transfers (See OT problem list below). He is overall Min guard to min A for LB ADL's and should benefit from acute OT followed by SNF Rehab/24/7 Assist prior to d/c home. Will follow acutely.      Follow Up Recommendations  SNF;Supervision/Assistance - 24 hour    Equipment Recommendations  Other (comment)(TBD in next venue)    Recommendations for Other Services       Precautions / Restrictions Precautions Precautions: Fall;ICD/Pacemaker Restrictions Weight Bearing Restrictions: No Other Position/Activity Restrictions: Pacemaker precautions       Mobility Bed Mobility Overal bed mobility: Needs Assistance Bed Mobility: Supine to Sit     Supine to sit: Supervision        Transfers Overall transfer level: Needs assistance Equipment used: Rolling walker (2 wheeled) Transfers: Sit to/from [facility] Sit to Stand: Min assist Stand pivot transfers: Min assist       General transfer comment: Min A for lift and to steady, min guard during ambulation and transfers as well as consistent vc's for safety and RW hand placement throuhgout session.    Balance Overall balance assessment: Needs assistance Sitting-balance support: Bilateral upper extremity supported;Single extremity supported;Feet  supported;No upper extremity supported Sitting balance-Leahy Scale: Good     Standing balance support: Bilateral upper extremity supported;Single extremity supported;During functional activity Standing balance-Leahy Scale: Poor Standing balance comment: Reliant on RUE support and external support for steadying.                            ADL either performed or assessed with clinical judgement   ADL Overall ADL's : Needs assistance/impaired Eating/Feeding: Set up;Sitting   Grooming: Wash/dry hands;Wash/dry face;Standing;Min guard   Upper Body Bathing: Minimal assistance;Standing;Cueing for safety;Cueing for sequencing Upper Body Bathing Details (indicate cue type and reason): Min Assist for safety secondary to decreased balance and h/o falls. Pt with noted swaying during activities, required reminders for safety and RW placement throughout session. Pt is most likely Mod I with set-up seated for UB bathing. Lower Body Bathing: Minimal assistance;Sit to/from stand;Sitting/lateral leans;Cueing for safety   Upper Body Dressing : Set up;Sitting   Lower Body Dressing: Min guard;Sit to/from stand Lower Body Dressing Details (indicate cue type and reason): Close supervision-Min guard assist for safety Toilet Transfer: Min guard;RW;Comfort height toilet;Ambulation;Grab bars;Cueing for safety;Cueing for sequencing Toilet Transfer Details (indicate cue type and reason): VC's for safety and sequencing with RW, hand placement Toileting- Clothing Manipulation and Hygiene: Supervision/safety;Sitting/lateral lean   Tub/ Shower Transfer: Walk-in shower;Minimal assistance;Ambulation;Grab bars;Rolling walker;Cueing for safety;Cueing for sequencing Tub/Shower Transfer Details (indicate cue type and reason): Pt requires consistent vc's for safety and sequencing with RW, hand placement. H/o falls, decreased awareness of deficits and decreased safety awareness impacting safety Functional mobility  during ADLs: Min guard;Cueing for  safety;Cueing for sequencing;Rolling walker General ADL Comments: Pt was assessed by OT followed by participation in ADL retraining session to include toileting, shower transfer, standing at sink for grooming and UB bathing. Recommend sitting for bathing and LB ADL's - pt states that he does not have assist at home. Discussd recommendation of 24/7 Assist and SNF Rehab which pt was initially against, but then open to the possibility. Overall decreased safety awareness and awareness of deficits impacts his ability to perform ADL/self care tasks at this time.     Vision Baseline Vision/History: Wears glasses Wears Glasses: At all times Patient Visual Report: No change from baseline       Perception     Praxis      Pertinent Vitals/Pain Pain Assessment: No/denies pain Pain Score: 0-No pain Faces Pain Scale: No hurt     Hand Dominance Right   Extremity/Trunk Assessment Upper Extremity Assessment Upper Extremity Assessment: Overall WFL for tasks assessed;Generalized weakness(AROM bilateral UE's is WFL's assessed grip, biceps bilaterally 3+/5)   Lower Extremity Assessment Lower Extremity Assessment: Defer to PT evaluation   Cervical / Trunk Assessment Cervical / Trunk Assessment: Kyphotic   Communication Communication Communication: No difficulties   Cognition Arousal/Alertness: Awake/alert Behavior During Therapy: WFL for tasks assessed/performed Overall Cognitive Status: No family/caregiver present to determine baseline cognitive functioning(Pt is foegetful/confused at times during assessment) Area of Impairment: Attention;Memory;Safety/judgement;Awareness;Problem solving                 Orientation Level: Disoriented to;Time   Memory: Decreased short-term memory;Decreased recall of precautions Following Commands: Follows one step commands with increased time Safety/Judgement: Decreased awareness of safety;Decreased awareness of  deficits Awareness: Emergent Problem Solving: Slow processing;Decreased initiation;Difficulty sequencing;Requires verbal cues;Requires tactile cues     General Comments       Exercises     Shoulder Instructions      Home Living Family/patient expects to be discharged to:: Private residence Living Arrangements: Alone   Type of Home: House Home Access: Level entry     Home Layout: One level     Bathroom Shower/Tub: Occupational psychologist: Handicapped height Bathroom Accessibility: Yes   Home Equipment: Environmental consultant - 2 wheels;Walker - 4 wheels;Cane - single point;Grab bars - toilet;Grab bars - tub/shower          Prior Functioning/Environment Level of Independence: Independent with assistive device(s)        Comments: Reports he uses cane and rollator. Pt recently was in SNF Rehab, but cannot recall why "They kidnapped me and brought me there"        OT Problem List: Decreased knowledge of use of DME or AE;Decreased knowledge of precautions;Decreased safety awareness;Impaired balance (sitting and/or standing)      OT Treatment/Interventions: Self-care/ADL training;DME and/or AE instruction;Cognitive remediation/compensation;Patient/family education;Other (comment)(Balance to be addressed in context of ADL's)    OT Goals(Current goals can be found in the care plan section) Acute Rehab OT Goals Patient Stated Goal: Go home Time For Goal Achievement: 04/11/17 Potential to Achieve Goals: Fair  OT Frequency: Min 2X/week   Barriers to D/C: Other (comment)  Pt lives alone; No family available to assist in home setting per his report       Co-evaluation              AM-PAC PT "6 Clicks" Daily Activity     Outcome Measure Help from another person eating meals?: None Help from another person taking care of personal grooming?: A Little Help from another person  toileting, which includes using toliet, bedpan, or urinal?: A Little Help from another person  bathing (including washing, rinsing, drying)?: A Little Help from another person to put on and taking off regular upper body clothing?: None Help from another person to put on and taking off regular lower body clothing?: A Little 6 Click Score: 20   End of Session Equipment Utilized During Treatment: Gait belt;Rolling walker;Other (comment)(Pt wears knee brace on R knee) Nurse Communication: Mobility status;Precautions;Other (comment)(ADl's performed and pt sitting up in chair; OT recommendations)  Activity Tolerance: Patient tolerated treatment well Patient left: in chair;with call bell/phone within reach;with chair alarm set  OT Visit Diagnosis: Other abnormalities of gait and mobility (R26.89);History of falling (Z91.81)                Time: 2449-7530 OT Time Calculation (min): 40 min Charges:  OT General Charges $OT Visit: 1 Visit OT Evaluation $OT Eval Low Complexity: 1 Low(15 min) OT Treatments $Self Care/Home Management : 23-37 mins(25 min) G-Codes:      Almyra Deforest, OTR/L 03/28/2017, 9:17 AM

## 2017-03-28 NOTE — Progress Notes (Signed)
Patient ID: [name], male   DOB: 12-May-1932, 84 y.o.   MRN: [id number]  PROGRESS NOTE    [name]  KWI:[id number] DOB: 17-Mar-1932 DOA: 03/26/2017 PCP: [name], MD   Brief Narrative:  84 year old male with history of chronic atrial fibrillation not on anticoagulation due to history of SDH, chronic systolic heart failure, CAD status post CABG in 1983 and stenting, ventricular tachycardia status post ICD, diabetes mellitus, hyperlipidemia, hypertension, chronic kidney disease stage III, GERD presented with pain in upper abdomen and lower chest after a fall.  CT chest was suggestive of probable chronic PE, cannot rule out acute PE.  Assessment & Plan:   Principal Problem:   Pulmonary embolism (HCC) Active Problems:   Chronic systolic heart failure- EF 35-40%   Automatic implantable cardioverter-defibrillator in situ   Chronic anticoagulation discontinued July 2013 after developed subdural hematoma    Right sided PE - chronic vs acute Case discussed with Dr. Oletta Darter on admission. probably chronic.  No acute distress currently.  On room air.   Lower extremity ultrasound duplex was negative for DVT.  No need for IVC filter 2D Echo - pending  I would be hesitant to start anticoagulation on this patient with history of SDH.  Will consult cardiology for the same.   Chronic systolic CHF -stable.  Continue digoxin, Lasix and metoprolol.  Strict input and output.  Daily weights.  Follow echocardiogram.  Repeat a.m. labs  Chronic kidney disease stage III -Stable.  Monitor creatinine  Chronic A.fib-rate controlled.  Continue digoxin and metoprolol  History of ventricular tachycardia status post AICD placement  History of CAD and CABG-continue meds as above  Hypertension-monitor blood pressure.  Continue metoprolol and Lasix.  Fall/weakness OT recommends nursing home placement.  Follow PT recommendations.  Social worker consult   DVT prophylaxis: SCDs Code  Status: Full Family Communication: None at bedside Disposition Plan: Nursing home in 1-2 days  Consultants: Called  cardiology  Procedures: Echo pending Lower extremity duplex ultrasound negative for DVT  Antimicrobials: None   Subjective: Patient seen and examined at bedside.  Patient denies any overnight fever, nausea or vomiting.  No current chest pain.  Objective: Vitals:   03/27/17 2253 03/28/17 0602 03/28/17 0919 03/28/17 0925  BP:  134/70    Pulse:  (!) 58 82   Resp:  17    Temp:  97.6 F (36.4 C)    TempSrc:  Oral    SpO2: 96% 93%  93%    Intake/Output Summary (Last 24 hours) at 03/28/2017 1006 Last data filed at 03/28/2017 0745 Gross per 24 hour  Intake 290 ml  Output -  Net 290 ml   There were no vitals filed for this visit.  Examination:  General exam: Appears calm and comfortable  Respiratory system: Bilateral decreased breath sound at bases Cardiovascular system: S1 & S2 heard, rate controlled  gastrointestinal system: Abdomen is nondistended, soft and nontender. Normal bowel sounds heard. Extremities: No cyanosis, clubbing, edema   Data Reviewed: I have personally reviewed following labs and imaging studies  CBC: Recent Labs  Lab 03/23/17 0622 03/24/17 1540 03/27/17 1445  WBC 7.1 7.3 7.6  NEUTROABS 3.7  --   --   HGB 13.7 14.1 14.4  HCT 41.9 42.0 43.4  MCV 92.7 90.9 91.9  PLT 176 245 242   Basic Metabolic Panel: Recent Labs  Lab 03/23/17 0622 03/24/17 1540 03/26/17 1858 03/27/17 1445  NA 138 135 139  --   K 3.8 3.7 3.8  --  CL 102 100* 99*  --   CO2 21* 20* 24  --   GLUCOSE 145* 117* 101*  --   BUN 19 22* 24*  --   CREATININE 1.47* 1.44* 1.32* 1.19  CALCIUM 9.1 9.1 9.5  --    GFR: Estimated Creatinine Clearance: 52.5 mL/min (by C-G formula based on SCr of 1.19 mg/dL). Liver Function Tests: Recent Labs  Lab 03/23/17 0622 03/24/17 1540 03/26/17 1858  AST 22 23 23   ALT 10* 9* 10*  ALKPHOS 64 61 62  BILITOT 1.3* 1.9* 1.7*    PROT 7.0 7.0 7.0  ALBUMIN 4.1 4.0 4.2   Recent Labs  Lab 03/23/17 0622 03/24/17 1540 03/26/17 1858  LIPASE 27 26 28    No results for input(s): AMMONIA in the last 168 hours. Coagulation Profile: Recent Labs  Lab 03/26/17 1720  INR 1.48   Cardiac Enzymes: Recent Labs  Lab 03/23/17 0622 03/23/17 1012  TROPONINI <0.03 0.03*   BNP (last 3 results) No results for input(s): PROBNP in the last 8760 hours. HbA1C: No results for input(s): HGBA1C in the last 72 hours. CBG: No results for input(s): GLUCAP in the last 168 hours. Lipid Profile: No results for input(s): CHOL, HDL, LDLCALC, TRIG, CHOLHDL, LDLDIRECT in the last 72 hours. Thyroid Function Tests: No results for input(s): TSH, T4TOTAL, FREET4, T3FREE, THYROIDAB in the last 72 hours. Anemia Panel: No results for input(s): VITAMINB12, FOLATE, FERRITIN, TIBC, IRON, RETICCTPCT in the last 72 hours. Sepsis Labs: Recent Labs  Lab 03/26/17 1736 03/26/17 1909  LATICACIDVEN 1.18 1.09    Recent Results (from the past 240 hour(s))  Urine culture     Status: None   Collection Time: 03/26/17  5:20 PM  Result Value Ref Range Status   Specimen Description   Final    URINE, CLEAN CATCH Performed at [facility], [place] [address], [place], [place] [address]    Special Requests   Final    NONE Performed at [facility], [place] [address] [place], [place] [address]    Culture   Final    NO GROWTH Performed at [facility], [place] [address] [place], [place] [address]    Report Status 03/28/2017 FINAL  Final         Radiology Studies: Ct Head Wo Contrast  Result Date: 03/26/2017 CLINICAL DATA:  Fall today. Posttraumatic headache. Initial encounter. Personal history of subdural hematoma. EXAM: CT HEAD WITHOUT CONTRAST TECHNIQUE: Contiguous axial images were obtained from the base of the skull through the vertex without intravenous contrast. COMPARISON:  03/24/2015 FINDINGS: Brain:  No evidence of acute infarction, hemorrhage, hydrocephalus, extra-axial collection, or mass lesion/mass effect. Stable mild cerebral atrophy and chronic small vessel disease. Vascular:  No hyperdense vessel or other acute findings. Skull: No evidence of fracture or other significant bone abnormality. Sinuses/Orbits:  No acute findings. Other: None. IMPRESSION: No acute intracranial abnormality. Stable mild cerebral atrophy and chronic small vessel disease. Electronically Signed   By: Earle Gell M.D.   On: 03/26/2017 20:52   Ct Chest W Contrast  Result Date: 03/26/2017 CLINICAL DATA:  84 year old male with abdominal trauma. Generalized soreness secondary to fall. EXAM: CT CHEST, ABDOMEN, AND PELVIS WITH CONTRAST TECHNIQUE: Multidetector CT imaging of the chest, abdomen and pelvis was performed following the standard protocol during bolus administration of intravenous contrast. CONTRAST:  18mL ISOVUE-300 IOPAMIDOL (ISOVUE-300) INJECTION 61% COMPARISON:  Chest radiograph dated 03/25/2017 and CT of the abdomen pelvis dated 02/06/2017 FINDINGS: CT CHEST FINDINGS  Cardiovascular: Mild cardiomegaly with dilatation of the right atrium. No pericardial effusion. Postsurgical changes and epicardial wires noted. Left pectoral pacemaker device. There is moderate atherosclerotic calcification of the thoracic aorta. No aneurysmal dilatation or dissection. There is thickened appearance of the wall of the left brachiocephalic trunk. There is partially occlusive thrombus in the subsegmental right lower lobe pulmonary artery branch which may represent residual thrombus prior PE or scarring with acute nonocclusive thrombus is less likely but not excluded. Correlation with history of previous PE recommended. No large or occlusive pulmonary artery embolus identified. Mediastinum/Nodes: No hilar or mediastinal adenopathy. The esophagus is grossly unremarkable. Bilateral hypodense thyroid nodules measure up to 3.7 cm in the inferior  pole of the left thyroid gland. Lungs/Pleura: There is emphysematous changes of the lungs. Postsurgical changes of resection of the previously seen right upper lobe mass. Right lung base subpleural rounded atelectasis or scarring. No new consolidative changes. Small chronic right pleural effusion. No pneumothorax. The central airways are patent. Musculoskeletal: There is osteopenia with degenerative changes of the spine. No acute osseous pathology. Median sternotomy wires noted. CT ABDOMEN PELVIS FINDINGS No intra-abdominal free air or free fluid. Hepatobiliary: Cholecystectomy. Focal area of hypodensity along the falciform ligament in the left lobe of the liver similar to prior CT and may represent focal fatty infiltration. A smaller subcentimeter hypodense lesion in the inferior aspect of the right lobe of the liver (series 3 image 489) is not well characterized. No intrahepatic biliary ductal dilatation. The gallbladder is surgically absent. Pancreas: Unremarkable. No pancreatic ductal dilatation or surrounding inflammatory changes. Spleen: Dystrophic calcification of the spleen as before. Adrenals/Urinary Tract: Subcentimeter left adrenal adenoma. The right adrenal gland is unremarkable. Probable 1 cm right renal inferior pole cyst. Right renal upper pole subcentimeter hypodensity is not characterized. There is no hydronephrosis on either side. There is symmetric enhancement and excretion of contrast by both kidneys. The visualized ureters and urinary bladder appear unremarkable. Stomach/Bowel: There is sigmoid diverticulosis without active inflammatory changes. No bowel obstruction or active inflammation. Normal appendix. Vascular/Lymphatic: Moderate aortoiliac atherosclerotic disease. The origins of the celiac axis, SMA, IMA are patent. No portal venous gas. There is no adenopathy. Reproductive: The prostate is grossly unremarkable. Other: Epicardial wires noted in the left anterior abdominal wall  Musculoskeletal: Degenerative changes of the spine and hips. Large anterior osteophyte at L2-L3 abutting the posterior aorta. No acute osseous pathology. IMPRESSION: 1. No acute/traumatic intrathoracic, abdominal, or pelvic pathology. 2. Nonocclusive pulmonary embolus in the subsegmental right lower lobe pulmonary artery branches likely old PE or scarring. Nonocclusive acute PE is not entirely excluded. Clinical correlation is recommended. 3. Mild cardiomegaly with dilatation of the right atrium. 4. 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 Small right pleural effusion and stable right lung base round atelectasis/scarring. 5. Aortic Atherosclerosis (ICD10-I70.0) and Emphysema (ICD10-J43.9). These results were called by telephone at the time of interpretation on 03/26/2017 at 11:27 pm to Dr. Marda Stalker , who verbally acknowledged these results. Electronically Signed   By: Anner Crete M.D.   On: 03/26/2017 23:32   Ct Abdomen Pelvis W Contrast  Result Date: 03/26/2017 CLINICAL DATA:  84 year old male with abdominal trauma. Generalized soreness secondary to fall. EXAM: CT CHEST, ABDOMEN, AND PELVIS WITH CONTRAST TECHNIQUE: Multidetector CT imaging of the chest, abdomen and pelvis was performed following the standard protocol during bolus administration of intravenous contrast. CONTRAST:  47mL ISOVUE-300 IOPAMIDOL (ISOVUE-300) INJECTION 61% COMPARISON:  Chest radiograph dated 03/25/2017 and CT of the abdomen  pelvis dated 02/06/2017 FINDINGS: CT CHEST FINDINGS Cardiovascular: Mild cardiomegaly with dilatation of the right atrium. No pericardial effusion. Postsurgical changes and epicardial wires noted. Left pectoral pacemaker device. There is moderate atherosclerotic calcification of the thoracic aorta. No aneurysmal dilatation or dissection. There is thickened appearance of the wall of the left brachiocephalic trunk. There is partially occlusive thrombus in the subsegmental right  lower lobe pulmonary artery branch which may represent residual thrombus prior PE or scarring with acute nonocclusive thrombus is less likely but not excluded. Correlation with history of previous PE recommended. No large or occlusive pulmonary artery embolus identified. Mediastinum/Nodes: No hilar or mediastinal adenopathy. The esophagus is grossly unremarkable. Bilateral hypodense thyroid nodules measure up to 3.7 cm in the inferior pole of the left thyroid gland. Lungs/Pleura: There is emphysematous changes of the lungs. Postsurgical changes of resection of the previously seen right upper lobe mass. Right lung base subpleural rounded atelectasis or scarring. No new consolidative changes. Small chronic right pleural effusion. No pneumothorax. The central airways are patent. Musculoskeletal: There is osteopenia with degenerative changes of the spine. No acute osseous pathology. Median sternotomy wires noted. CT ABDOMEN PELVIS FINDINGS No intra-abdominal free air or free fluid. Hepatobiliary: Cholecystectomy. Focal area of hypodensity along the falciform ligament in the left lobe of the liver similar to prior CT and may represent focal fatty infiltration. A smaller subcentimeter hypodense lesion in the inferior aspect of the right lobe of the liver (series 3 image 489) is not well characterized. No intrahepatic biliary ductal dilatation. The gallbladder is surgically absent. Pancreas: Unremarkable. No pancreatic ductal dilatation or surrounding inflammatory changes. Spleen: Dystrophic calcification of the spleen as before. Adrenals/Urinary Tract: Subcentimeter left adrenal adenoma. The right adrenal gland is unremarkable. Probable 1 cm right renal inferior pole cyst. Right renal upper pole subcentimeter hypodensity is not characterized. There is no hydronephrosis on either side. There is symmetric enhancement and excretion of contrast by both kidneys. The visualized ureters and urinary bladder appear unremarkable.  Stomach/Bowel: There is sigmoid diverticulosis without active inflammatory changes. No bowel obstruction or active inflammation. Normal appendix. Vascular/Lymphatic: Moderate aortoiliac atherosclerotic disease. The origins of the celiac axis, SMA, IMA are patent. No portal venous gas. There is no adenopathy. Reproductive: The prostate is grossly unremarkable. Other: Epicardial wires noted in the left anterior abdominal wall Musculoskeletal: Degenerative changes of the spine and hips. Large anterior osteophyte at L2-L3 abutting the posterior aorta. No acute osseous pathology. IMPRESSION: 1. No acute/traumatic intrathoracic, abdominal, or pelvic pathology. 2. Nonocclusive pulmonary embolus in the subsegmental right lower lobe pulmonary artery branches likely old PE or scarring. Nonocclusive acute PE is not entirely excluded. Clinical correlation is recommended. 3. Mild cardiomegaly with dilatation of the right atrium. 4. 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 Small right pleural effusion and stable right lung base round atelectasis/scarring. 5. Aortic Atherosclerosis (ICD10-I70.0) and Emphysema (ICD10-J43.9). These results were called by telephone at the time of interpretation on 03/26/2017 at 11:27 pm to Dr. Marda Stalker , who verbally acknowledged these results. Electronically Signed   By: Anner Crete M.D.   On: 03/26/2017 23:32        Scheduled Meds: . cycloSPORINE  1 drop Both Eyes BID  . digoxin  0.125 mg Oral Daily  . famotidine  20 mg Oral BID  . furosemide  40 mg Oral Daily  . gabapentin  100-300 mg Oral BID  . heparin  5,000 Units Subcutaneous Q8H  . metoprolol succinate  25 mg Oral Daily  .  mometasone-formoterol  2 puff Inhalation BID  . potassium chloride  10 mEq Oral Daily   Continuous Infusions:   LOS: 1 day        Aline August, MD [facility] Pager [phone]  If 7PM-7AM, please contact night-coverage [email or web address] Password  [email or web address] 03/28/2017, 10:06 AM

## 2017-03-28 NOTE — CV Procedure (Signed)
Patient is refusing his echocardiogram at this time. He says he has his discharge papers he is just waiting for someone to take him off his monitor.   Darlina Sicilian RDCS

## 2017-03-28 NOTE — Consult Note (Addendum)
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 celiac axis, SMA, IMA are patent. No portal venous gas. There is no adenopathy. Reproductive: The prostate is grossly unremarkable. Other: Epicardial wires noted in the left anterior abdominal wall Musculoskeletal: Degenerative changes of the spine and hips. Large anterior osteophyte at L2-L3 abutting the posterior aorta. No acute osseous pathology. IMPRESSION: 1. No acute/traumatic intrathoracic, abdominal, or pelvic pathology. 2. Nonocclusive pulmonary embolus in the subsegmental right lower lobe pulmonary artery branches likely old PE or scarring. Nonocclusive acute PE is not entirely excluded. Clinical correlation is recommended. 3. Mild cardiomegaly with dilatation of the right atrium. 4. 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 Small right pleural effusion and stable right lung base round atelectasis/scarring. 5. Aortic Atherosclerosis (ICD10-I70.0) and Emphysema (ICD10-J43.9). These results were called by telephone at the time of interpretation on  03/26/2017 at 11:27 pm to Dr. Marda Stalker , who verbally acknowledged these results. Electronically Signed  By: Anner Crete M.D.   On: 03/26/2017 23:32    Assessment and Plan:   Chest/abdominal pain after a fall -Pt lost balance and fell hitting his chest on his cane. No traumatic injuries noted on chest CT or chest xray. -Pt denies chest pain, no tenderness to palpation  Ischemic cardiomyopathy with chronic systolic CHF -Hx EF 42-35%. Updated echo pending.  -Has ICD in place. Recent lead revision on 03/05/17 -Medical management includes lasix 40 mg daily with extra dose prn swelling and Toprol 25 mg daily -Does not appear volume overloaded on exam  Permanent afib -not a candidate for anticoagulation due to a history of subdural hematoma.  -Rate is controlled with metoprolol and digoxin. Had afib with RVR in January and cardveilol was switched to metoprolol and digoxin added. Dig level on 2/24 was 0.4. Will check dig level.  -continue current therapy  Pulmonary embolus -Chest CT is suggestive of PE, question whether chronic or acute. Pt without respiratory difficulty.  -Lower extremity ultrasound negative for DVT bilaterally -Echo pending to evaluate for RV strain. No strain pattern on EKG -Given his current stability and lack of acute symptoms he is likely still not a good candidate for anticoagulation especially with his recent fall and confusion. Will discuss with Dr. Tamala Julian  Confusion and agitation -Pt became confused, agitated and combative today. He also did this in February with his ICD lead revision -Lives alone. SNF placement recommended and pt refusing. Psych consulted to assess for ability to make decisions.  -Calmer after haldol  Hx of Vtach -He has a remote history of Vtach, now with ICD in place. No documented recurrences of VT and no ICD shocks. Amiodarone was discontinued in the past due to tremors and nightmares.   CAD  -S/P CABG 1983, stent 2004. No  anginal symptoms appreciated.   CKD stage III SCt stable, 1.32 on admission, at [facility]line. Today 1.19.     For questions or updates, please contact [place] Please consult [email or web address] for contact info under Cardiology/STEMI.   SignedDaune Perch, NP  03/28/2017 12:33 PM

## 2017-03-28 NOTE — Progress Notes (Signed)
Patient became very agitated, wanting to leave AMA, saying he has been kidnapped.  MD notified.  IV taken because patient was trying to rip it out.  MD gave verbal order to IVC patient and call security.  Security called.  Patient swinging at staff with his cane.  While he was swinging at staff, he obtained a small skin tear on his right forearm.  Security arrived and MD gave verbal orders.  See new orders.  Bilateral wrist and ankle restraints applied per MD.  Patient placed back in bed.  PRNs given. MD notified family.  AC notified about need for sitter.  Tech placed at bedside until Merchandiser, retail.  Patient now resting quietly. Virginia Rochester, RN

## 2017-03-28 NOTE — Clinical Social Work Note (Signed)
Clinical Social Work Assessment  Patient Details  Name: [name] MRN: [id number] Date of Birth: 10/31/1932  Date of referral:  03/28/17               Reason for consult:  Facility Placement                Permission sought to share information with:  Case Manager Permission granted to share information::  No  Name::     (Patient refused)  Agency::     Relationship::     Contact Information:     Housing/Transportation Living arrangements for the past 2 months:  Apartment Source of Information:  Patient Patient Interpreter Needed:  None Criminal Activity/Legal Involvement Pertinent to Current Situation/Hospitalization:  No - Comment as needed Significant Relationships:  Warehouse manager, Friend Lives with:  Self Do you feel safe going back to the place where you live?  Yes Need for family participation in patient care:  No (Coment)  Care giving concerns:  Patient lives alone. Son livesin [place].    Social Worker assessment / plan:  LCSW consulted for SNF placement.   Patient admitted for chest pain.   LCSW met with patient at bedside. No family present. Patient did not give permission for LCSW to speak with son.   OT recommending SNF. Awaiting PT rec.   Patient refusing SNF at this time. Patient stated that he was just dc's from [facility]. Stated he stayed there for 3 weeks.   Patient reports that he ambulates with a rolling walker. Patient reports that he his independent in his ADLs. Patient stated that he prepares his own meals, bathes and dresses without assistance and still drives.   Patient states that he has friends all around him to help. Patient stated that he prefers home health. LCSW notified RNCM of patients wishes for home health.  PLAN: Patient prefers home health at dc.   Employment status:  Retired Forensic scientist:  Medicare PT Recommendations:  Not assessed at this time Information / Referral to community resources:     Patient/Family's  Response to care:  Patient states that he needs to speak with Dr. Sharlene Motts. LCSW notified RN.   Patient/Family's Understanding of and Emotional Response to Diagnosis, Current Treatment, and Prognosis:  Patient is understanding of diagnosis. Patient is not agreeable to SNF.   Emotional Assessment Appearance:  Appears stated age Attitude/Demeanor/Rapport:    Affect (typically observed):  Calm Orientation:  Oriented to Self, Oriented to Place, Oriented to  Time, Oriented to Situation Alcohol / Substance use:  Not Applicable Psych involvement (Current and /or in the community):  No (Comment)  Discharge Needs  Concerns to be addressed:  No discharge needs identified Readmission within the last 30 days:  Yes Current discharge risk:  Lives alone Barriers to Discharge:  Continued Medical Work up   [employer], LCSW 03/28/2017, 11:24 AM

## 2017-03-28 NOTE — Consult Note (Signed)
   [facility] Inpatient Consult   03/28/2017  [name] 03/07/1932 [id number]   [name] is followed by [facility] Management program.   [name] to bedside. He is confused and in restraints.   Will make inpatient RNCM aware [name] is active.   Will update [facility] team as well.    Marthenia Rolling, MSN-Ed, RN,BSN [facility] Liaison [phone]

## 2017-03-29 ENCOUNTER — Inpatient Hospital Stay (HOSPITAL_COMMUNITY): Payer: Medicare Other

## 2017-03-29 ENCOUNTER — Ambulatory Visit: Payer: Medicare Other

## 2017-03-29 DIAGNOSIS — Z008 Encounter for other general examination: Secondary | ICD-10-CM

## 2017-03-29 DIAGNOSIS — I2782 Chronic pulmonary embolism: Secondary | ICD-10-CM

## 2017-03-29 DIAGNOSIS — Z87891 Personal history of nicotine dependence: Secondary | ICD-10-CM

## 2017-03-29 LAB — BASIC METABOLIC PANEL
ANION GAP: 11 (ref 5–15)
BUN: 16 mg/dL (ref 6–20)
CALCIUM: 9.4 mg/dL (ref 8.9–10.3)
CO2: 30 mmol/L (ref 22–32)
CREATININE: 1.15 mg/dL (ref 0.61–1.24)
Chloride: 99 mmol/L — ABNORMAL LOW (ref 101–111)
GFR calc Af Amer: >60 mL/min (ref >60)
GFR, EST NON AFRICAN AMERICAN: 57 mL/min — AB (ref >60)
GLUCOSE: 125 mg/dL — AB (ref 65–99)
Potassium: 3.5 mmol/L (ref 3.5–5.1)
Sodium: 140 mmol/L (ref 135–145)

## 2017-03-29 LAB — DIGOXIN LEVEL: Digoxin Level: 0.5 ng/mL — ABNORMAL LOW (ref 0.8–2.0)

## 2017-03-29 LAB — ECHOCARDIOGRAM COMPLETE

## 2017-03-29 LAB — MAGNESIUM: Magnesium: 1.6 mg/dL — ABNORMAL LOW (ref 1.7–2.4)

## 2017-03-29 NOTE — Discharge Summary (Signed)
Physician Discharge Summary  Patient ID: [name] MRN: [id number] DOB/AGE: Jun 15, 1932 84 y.o.  Admit date: 03/26/2017 Discharge date: 03/29/2017  Admission Diagnoses: Principal Problem: Pulmonary embolism [name]) Active Problems: Chronic systolic heart failure- EF 35-40% Automatic implantable cardioverter-defibrillator in situ Chronic anticoagulation discontinued July 2013 after developed subdural hematoma  Discharge Diagnoses:  Active Problems:   Chronic systolic heart failure- EF 35-40%   Automatic implantable cardioverter-defibrillator in situ   Chronic anticoagulation discontinued July 2013 after developed subdural hematoma   Chronic pulmonary embolism (HCC)         Impression/Plan:       Discharged Condition: good  Hospital Course:  84 yo w hx chronic atrial fibrillation not on anticoagulation due to history of SDH, chronic systolic heart failure, CAD status post CABG in 1983 and stenting, vent tachycardia status post ICD, diabetes mellitus, hyperlipidemia, hypertension, chronic kidney disease stage III, GERD presented with pain in upper abdomen and lower chest after a fall.CT chest was suggestive of probable chronic PE, cannot rule out acute PE.  Pt admitted for further discussions regarding anticoagulating or not.  Pt was admitted.     Problems: 1) Right sidedPE - had one area of subsegmental RLL PE, prob chronic (old), can't r/o acute though. Pt was asyptomatic.  - case d/w CCM on admission, probably old PE - no resp symptoms or hypoxemia - lower extremity ultrasound duplex was negative for DVT.No need for IVC filter - 2D Echoshowed mildly decreased LV EF, not good study, pt refused definity - cardiology consulted for this issue >>  they felt pt poor candidate for anticoagulation given hx of falls and SDH. Pt will dc home, no change meds, no anticoagulation  2) Chronic systolic CHF -stable. Continue digoxin, Lasix and metoprolol at  dc.   3)  Chronic kidney disease stage III -stable here  4)  ChronicA.fib-rate controlled. Continue digoxin and metoprolol  5)  History of ventricular tachycardia status postAICD placement  6)  History ofCADand CABG-continue meds as above  7)  Hypertension-monitor blood pressure. Continue metoprolol and Lasix.  8)  Fall/weakness - OT recommends nursing home placement but pt refused SNF. Pt will be dc'd home.   9)  Psych - seen by psych for threatening to leave AMA.  According to psych pt is not capable of making decisions for himself.  Therefore we contacted patient's family member to see if they would consent for patient to be dc'd home and the son [name]) said that he would consent for patient to be dc'd home.     Code Status:Full  Consultants:seen by cardiology  Procedures:Echo done, LE dopplers negative for DVT  Discharge Exam: Blood pressure (!) 144/82, pulse 89, temperature (!) 97.4 F (36.3 C), temperature source Axillary, resp. rate 18, SpO2 97 %. General exam: Appears calm and comfortable  Respiratory system: Bilateral decreased breath sound at bases Cardiovascular system: S1 & S2 heard, rate controlled  gastrointestinal system: Abdomen is nondistended, soft and nontender. Normal bowel sounds heard. Extremities: No cyanosis, clubbing, edema  Neuro: gets up to walk with walker w/o assist.  Oriented to place, "Trump", 2018, "Wednesday".     Disposition: 01-Home or Self Care   Allergies as of 03/29/2017      Reactions   Tamsulosin Other (See Comments)   Dizziness, Made BP very low and weakness   Celebrex [celecoxib] Hives, Nausea And Vomiting   Gi upset   Dronedarone Nausea And Vomiting, Other (See Comments)   GI upset, abdominal pain  Esomeprazole Magnesium Hives, Other (See Comments)   "don't really remember"   Digoxin Diarrhea   May have caused some diarrhea   Hydrocodone-acetaminophen Other (See Comments)   Bad headache    Protonix [pantoprazole Sodium] Nausea And Vomiting, Other (See Comments)   Tolerates Dexilant      Medication List    TAKE these medications   ADVAIR DISKUS 250-50 MCG/DOSE Aepb Generic drug:  Fluticasone-Salmeterol Inhale 1 puff into the lungs 2 (two) times daily.   calcium carbonate 500 MG chewable tablet Commonly known as:  TUMS - dosed in mg elemental calcium Chew 1 tablet by mouth daily as needed for heartburn.   digoxin 0.125 MG tablet Commonly known as:  LANOXIN Take 1 tablet (0.125 mg total) by mouth daily.   fluticasone 50 MCG/ACT nasal spray Commonly known as:  FLONASE Place 2 sprays into both nostrils daily. What changed:    how much to take  when to take this  reasons to take this   furosemide 40 MG tablet Commonly known as:  LASIX Take 1 tablet (40 mg total) by mouth daily. May take an extra tab daily as needed for swelling   gabapentin 100 MG capsule Commonly known as:  NEURONTIN TAKE 1 CAPSULE (100MG ) TO 3 CAPSULES (300MG ) EACH NIGHT AS NEEDED FOR MUSCLE PAINS   LINZESS 72 MCG capsule Generic drug:  linaclotide Take 72 mcg by mouth daily as needed (for constipation).   loratadine 10 MG tablet Commonly known as:  CLARITIN Take 10 mg by mouth daily as needed for allergies.   metoprolol succinate 25 MG 24 hr tablet Commonly known as:  TOPROL-XL Take 1 tablet (25 mg total) by mouth daily.   nitroGLYCERIN 0.4 MG SL tablet Commonly known as:  NITROSTAT PLACE ONE UNDER TONGUE FOR CHEST PAIN. What changed:  See the new instructions.   polyethylene glycol packet Commonly known as:  MIRALAX / GLYCOLAX Take 17 g by mouth daily as needed for mild constipation.   potassium chloride 10 MEQ tablet Commonly known as:  K-DUR,KLOR-CON Take 1 tablet (10 mEq total) by mouth daily.   ranitidine 150 MG tablet Commonly known as:  ZANTAC Take 1 tablet (150 mg total) 2 (two) times daily by mouth.   RESTASIS 0.05 % ophthalmic emulsion Generic drug:   cycloSPORINE INSTILL 1 DROP INTO BOTH EYES TWICE A DAY   traMADol 50 MG tablet Commonly known as:  ULTRAM TAKE 2 TABLETS BY MOUTH EVERY 8 HOURS AS NEEDED FOR PAIN        Signed: RAYNE [name] 03/29/2017, 3:49 PM

## 2017-03-29 NOTE — Progress Notes (Signed)
  Echocardiogram 2D Echocardiogram has been performed.  Darlina Sicilian M 03/29/2017, 10:29 AM

## 2017-03-29 NOTE — Progress Notes (Signed)
Occupational Therapy Treatment Patient Details Name: [name] MRN: [id number] DOB: [birth year] Today's Date: 03/29/2017    History of present illness 84 y.o. male with medical history significant of a.fib, AICD placement, CAD.  Patient presents to the ED with c/o pain in upper abd and lower chest that onset after a fall.  He states he lost his balance and fell, landing on top of his cane.  The cane struck him in the chest.   OT comments  Pt likely very close to his baseline. Walked with a cane and performed ADL with supervision for safety. Pt educated in fall prevention. Pt with intact cognition this visit. Updated d/c recommendation to [place], pt in agreement.  Follow Up Recommendations  Home health OT , Home health aide   Equipment Recommendations  None recommended by OT    Recommendations for Other Services      Precautions / Restrictions Precautions Precautions: Fall Restrictions Weight Bearing Restrictions: No LUE Weight Bearing: Non weight bearing       Mobility Bed Mobility Overal bed mobility: Modified Independent             General bed mobility comments: HOB up  Transfers Overall transfer level: Modified independent Equipment used: Straight cane                  Balance Overall balance assessment: Needs assistance   Sitting balance-Leahy Scale: Good       Standing balance-Leahy Scale: Fair Standing balance comment: reliant on cane for dynamic balance                           ADL either performed or assessed with clinical judgement   ADL Overall ADL's : Needs assistance/impaired     Grooming: Supervision/safety;Standing;Wash/dry hands;Brushing hair           Upper Body Dressing : Set up;Sitting   Lower Body Dressing: Supervision/safety;Sit to/from stand   Toilet Transfer: Supervision/safety;Ambulation           Functional mobility during ADLs: Supervision/safety;Cane General ADL Comments: Pt initiating rest  break during ambulation     Vision       Perception     Praxis      Cognition Arousal/Alertness: Awake/alert Behavior During Therapy: WFL for tasks assessed/performed Overall Cognitive Status: Within Functional Limits for tasks assessed                                 General Comments: pt answered all questions appropriately.         Exercises     Shoulder Instructions       General Comments      Pertinent Vitals/ Pain       Pain Assessment: No/denies pain  Home Living                                          Prior Functioning/Environment              Frequency  Min 2X/week        Progress Toward Goals  OT Goals(current goals can now be found in the care plan section)  Progress towards OT goals: Progressing toward goals  Acute Rehab OT Goals Patient Stated Goal: Go home Time For Goal Achievement: 04/11/17 Potential to Achieve Goals: Good  Plan Discharge plan needs to be updated    Co-evaluation                 AM-PAC PT "6 Clicks" Daily Activity     Outcome Measure   Help from another person eating meals?: None Help from another person taking care of personal grooming?: A Little Help from another person toileting, which includes using toliet, bedpan, or urinal?: A Little Help from another person bathing (including washing, rinsing, drying)?: A Little Help from another person to put on and taking off regular upper body clothing?: None Help from another person to put on and taking off regular lower body clothing?: A Little 6 Click Score: 20    End of Session Equipment Utilized During Treatment: Gait belt  OT Visit Diagnosis: Other abnormalities of gait and mobility (R26.89);History of falling (Z91.81)   Activity Tolerance Patient tolerated treatment well   Patient Left in bed;with call bell/phone within reach   Nurse Communication          Time: 1700-1749 OT Time Calculation (min): 23  min  Charges: OT General Charges $OT Visit: 1 Visit OT Treatments $Self Care/Home Management : 8-22 mins  03/29/2017 Nestor Lewandowsky, OTR/L Pager: [phone]   Werner Lean, Haze Boyden 03/29/2017, 2:22 PM

## 2017-03-29 NOTE — Progress Notes (Signed)
LCSW spoke with patients son, [name].   [name] wanted to speak with patient. LCSW provided son with patients room number and floor number.   Per attending patients son is agreeable for patient to return home.   Carolin Coy [place]

## 2017-03-29 NOTE — Care Management Note (Signed)
Case Management Note  Patient Details  Name: [name] MRN: [id number] Date of Birth: Jan 14, 1933  Subjective/Objective:                    Action/Plan:   Expected Discharge Date:  03/29/17               Expected Discharge Plan:  [place]  In-House Referral:  Clinical Social Work  Discharge planning Services  CM Consult  Post Acute Care Choice:    Choice offered to:  Patient  DME Arranged:    DME Agency:     HH Arranged:    [place] Agency:  [facility]  Status of Service:  In process, will continue to follow  If discussed at Long Length of Stay Meetings, dates discussed:    Additional CommentsPurcell Mouton, RN 03/29/2017, 4:28 PM
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
## 2017-03-29 NOTE — Progress Notes (Addendum)
Physical Therapy Evaluation Patient Details Name: [name] MRN: [id number] DOB: 12/25/1932 Today's Date: 03/29/2017    History of Present Illness 84 y.o. male with medical history significant of a.fib, AICD placement, CAD.  Patient presents to the ED with c/o pain in upper abd and lower chest that onset after a fall.  He states he lost his balance and fell, landing on top of his cane.  The cane struck him in the chest.    PT Comments    On eval, pt was supervision level assist for mobility. He walked ~115 feet with his straight cane. No LOB during session. Pt appeared to be cognitively intact. He answered all questions appropriately. He acknowledges that he is weaker than his baseline. Discussed d/c plan-he declines placement. He is agreeable to HHPT. Recommend setting pt up with as much assistance as he can have in the home- [place], [place], home health aide, RN. Will continue to follow and progress activity as tolerated.     Follow Up Recommendations  Home health PT;Home Health OT; Home Health Aide     Equipment Recommendations  None recommended by PT    Recommendations for Other Services       Precautions / Restrictions Precautions Precautions: Fall Restrictions Weight Bearing Restrictions: No    Mobility  Bed Mobility Overal bed mobility: Modified Independent                Transfers Overall transfer level: Modified independent                  Ambulation/Gait Ambulation/Gait assistance: Supervision Ambulation Distance (Feet): 115 Feet Assistive device: Straight cane Gait Pattern/deviations: Step-through pattern;Decreased stride length     General Gait Details: for safety. no LOB. no physical assist given. 1 brief standing rest break mid distance.    Stairs            Wheelchair Mobility    Modified Rankin (Stroke Patients Only)       Balance Overall balance assessment: Needs assistance           Standing balance-Leahy Scale:  Fair                              Cognition Arousal/Alertness: Awake/alert Behavior During Therapy: WFL for tasks assessed/performed Overall Cognitive Status: Within Functional Limits for tasks assessed                                 General Comments: pt answered all questions appropriately.       Exercises      General Comments        Pertinent Vitals/Pain Pain Assessment: No/denies pain    Home Living                      Prior Function            PT Goals (current goals can now be found in the care plan section) Progress towards PT goals: Progressing toward goals    Frequency    Min 3X/week      PT Plan      Co-evaluation              AM-PAC PT "6 Clicks" Daily Activity  Outcome Measure  Difficulty turning over in bed (including adjusting bedclothes, sheets and blankets)?: A Little Difficulty moving from lying on back to sitting  on the side of the bed? : A Little Difficulty sitting down on and standing up from a chair with arms (e.g., wheelchair, bedside commode, etc,.)?: A Little Help needed moving to and from a bed to chair (including a wheelchair)?: A Little Help needed walking in hospital room?: A Little Help needed climbing 3-5 steps with a railing? : A Little 6 Click Score: 18    End of Session Equipment Utilized During Treatment: Gait belt Activity Tolerance: Patient tolerated treatment well Patient left: in bed;with call bell/phone within reach;with nursing/sitter in room   PT Visit Diagnosis: Unsteadiness on feet (R26.81);Muscle weakness (generalized) (M62.81)     Time: 8115-7262 PT Time Calculation (min) (ACUTE ONLY): 23 min  Charges:  $Evaluation-Mod level                    G Codes:          Weston Anna, MPT Pager: [phone]

## 2017-03-29 NOTE — Progress Notes (Signed)
LCSW consulted for SNF placement.   LCSW assessed patient on 2/26. Patient refused SNF and prefers home health. LCSW notified RNCM on 2/26 of patients home health needs.   PT rec home health.   LCSW signing off.   Carolin Coy [place]

## 2017-03-30 ENCOUNTER — Telehealth: Payer: Self-pay | Admitting: Family Medicine

## 2017-03-30 ENCOUNTER — Other Ambulatory Visit: Payer: Self-pay | Admitting: *Deleted

## 2017-03-30 DIAGNOSIS — K625 Hemorrhage of anus and rectum: Secondary | ICD-10-CM | POA: Diagnosis not present

## 2017-03-30 DIAGNOSIS — E119 Type 2 diabetes mellitus without complications: Secondary | ICD-10-CM | POA: Diagnosis not present

## 2017-03-30 DIAGNOSIS — I255 Ischemic cardiomyopathy: Secondary | ICD-10-CM | POA: Diagnosis not present

## 2017-03-30 DIAGNOSIS — I110 Hypertensive heart disease with heart failure: Secondary | ICD-10-CM | POA: Diagnosis not present

## 2017-03-30 DIAGNOSIS — I2510 Atherosclerotic heart disease of native coronary artery without angina pectoris: Secondary | ICD-10-CM | POA: Diagnosis not present

## 2017-03-30 DIAGNOSIS — I5022 Chronic systolic (congestive) heart failure: Secondary | ICD-10-CM | POA: Diagnosis not present

## 2017-03-30 NOTE — Patient Outreach (Signed)
[facility]) Care Management  03/30/2017  JASIRI HANAWALT [birth year] [id number]    Transition of care (initial D/C 2/27)  RN attempted the initial outreach call to pt however unsuccessful but able to leave a HIPAA approved voice message requesting a call back. Will attempt another engagement tomorrow with the social worker for possible engagement for ongoing [facility] services.  Raina Mina, RN Care Management Coordinator [place] Office [phone]

## 2017-03-30 NOTE — Telephone Encounter (Signed)
Copied from [place]. Topic: General - Other >> Mar 30, 2017  2:30 PM Yvette Rack wrote: Reason for CRM: Claiborne Billings from [facility] [phone] calling stating that this patient is doing awful he shouldn't be home alone he had just gotten out of the hospital and the son that hasn't seen his father in 7 yrs signed out of hospital to go home alone she would like verbal orders  for Home PT 2 times a week for 2 weeks Home Health Aid to help with bathing for the next 2 weeks A nurse evaluation for rt arm skin care and to help with medicine management

## 2017-03-31 ENCOUNTER — Other Ambulatory Visit: Payer: Self-pay | Admitting: *Deleted

## 2017-03-31 ENCOUNTER — Other Ambulatory Visit: Payer: Self-pay | Admitting: Licensed Clinical Social Worker

## 2017-03-31 DIAGNOSIS — I255 Ischemic cardiomyopathy: Secondary | ICD-10-CM | POA: Diagnosis not present

## 2017-03-31 DIAGNOSIS — I5022 Chronic systolic (congestive) heart failure: Secondary | ICD-10-CM | POA: Diagnosis not present

## 2017-03-31 DIAGNOSIS — E119 Type 2 diabetes mellitus without complications: Secondary | ICD-10-CM | POA: Diagnosis not present

## 2017-03-31 DIAGNOSIS — I110 Hypertensive heart disease with heart failure: Secondary | ICD-10-CM | POA: Diagnosis not present

## 2017-03-31 DIAGNOSIS — I2510 Atherosclerotic heart disease of native coronary artery without angina pectoris: Secondary | ICD-10-CM | POA: Diagnosis not present

## 2017-03-31 DIAGNOSIS — K625 Hemorrhage of anus and rectum: Secondary | ICD-10-CM | POA: Diagnosis not present

## 2017-03-31 NOTE — Telephone Encounter (Signed)
>>   Mar 31, 2017 11:45 AM Vernona Rieger wrote: Madelynn Done from [facility] called back and said that they can not get a hold of the patient and are worried so they are going to go over and check and make sure he is living. Just a heads up.  Advised her that her verbal orders would be forwarded to Dr Sarajane Jews today and someone would call her back before end of day.  [phone]

## 2017-03-31 NOTE — Patient Outreach (Signed)
Assessment;  CSW Theadore Nan and RN Osborn Coho called client on conference call from [facility] office on 03/31/17. CSW called client mobile and home phone numbers several times on 03/31/17 but was not able to speak via phone with client.  CSW left phone message for client on 03/31/17 stating that Lattie Haw and [place] called and requested client return call to CSW at [phone] to discuss client needs.  CSW is awaiting return call from client to assess current client needs.      Plan:  Client to call CSW at [phone] to discuss client needs.  CSW to call client as scheduled to assess client needs.   [name].Olina Melfi MSW, LCSW Licensed Clinical Social Worker [facility] Care Management [phone]

## 2017-03-31 NOTE — Telephone Encounter (Signed)
Kelly called back. She is concerned about the patient and would like orders before the weekend.  Explained that the initial request was made late yesterday afternoon and provider has not been able to review yet, but we will make every effort to get back to her prior to the weekend.

## 2017-03-31 NOTE — Telephone Encounter (Signed)
Done

## 2017-03-31 NOTE — Patient Outreach (Signed)
[facility]) Care Management  03/31/2017  JEROD MCQUAIN [birth year] [id number]    Transition of care (2nd attempt)/Coordination of care with involved [place] agency.  [facility] social worker Nicki Reaper F.) attempted to contact pt several times today however unsuccessful but able to leave a HIPAA approval voice message requesting a call back. Will send outreach letter accordingly and re-attempt another follow up call in 10 days for possible services. Epic notes indicated [facility] service as pt agreed but pt refused SNF placement. RN will contacted [facility] ( spoke w/ Iris Pert PT [phone]) verified documentation in Epic on services pending for RN/PT/Aide with a call to Dr. Sarajane Jews. Representative confirms they are awaiting orders back from Dr. Sarajane Jews for pending services as noted above along with wound care orders for right arm skin tears and medication issues with missing Toprol (pharmacy called awaiting pt to pick up this medication). States several medication pt was not to take were "put away".  Reports pt had a visit from PT yesterday and today pending OT however pt is not answering the phone but indicated pt's truck in the driveways and OT will attempt a visit. States on yesterday when she visits pt was "disheveled and stains on his shirt and he was nausated" unusual for this pt. States he was not using his cane which was later found outside the home. Pt's primary provider was contacted with this request and update. Reports the agency is currently pending a call back. RN requested to interact with the visiting nurse once established however requested to contact her as the representative for this pt at this time if there was any successful outreach to this pt (receptive). Will follow up next week accordingly.  Raina Mina, RN Care Management Coordinator [place] Office [phone]

## 2017-03-31 NOTE — Telephone Encounter (Signed)
Discussed w/ Dr. Sarajane Jews, okay for verbal orders as specified below.  Returned call to [place] and left verbal order on voicemail with instructions to return call if questions.

## 2017-04-01 ENCOUNTER — Emergency Department (HOSPITAL_COMMUNITY): Payer: Medicare Other

## 2017-04-01 ENCOUNTER — Inpatient Hospital Stay (HOSPITAL_COMMUNITY)
Admission: EM | Admit: 2017-04-01 | Discharge: 2017-04-04 | DRG: 312 | Disposition: A | Discharge status: Home Health | Payer: Medicare Other | Attending: Internal Medicine | Admitting: Internal Medicine

## 2017-04-01 DIAGNOSIS — S3993XA Unspecified injury of pelvis, initial encounter: Secondary | ICD-10-CM | POA: Diagnosis not present

## 2017-04-01 DIAGNOSIS — K219 Gastro-esophageal reflux disease without esophagitis: Secondary | ICD-10-CM | POA: Diagnosis present

## 2017-04-01 DIAGNOSIS — S299XXA Unspecified injury of thorax, initial encounter: Secondary | ICD-10-CM | POA: Diagnosis not present

## 2017-04-01 DIAGNOSIS — N179 Acute kidney failure, unspecified: Secondary | ICD-10-CM | POA: Diagnosis present

## 2017-04-01 DIAGNOSIS — M1990 Unspecified osteoarthritis, unspecified site: Secondary | ICD-10-CM | POA: Diagnosis present

## 2017-04-01 DIAGNOSIS — E041 Nontoxic single thyroid nodule: Secondary | ICD-10-CM | POA: Diagnosis present

## 2017-04-01 DIAGNOSIS — R945 Abnormal results of liver function studies: Secondary | ICD-10-CM

## 2017-04-01 DIAGNOSIS — Z951 Presence of aortocoronary bypass graft: Secondary | ICD-10-CM

## 2017-04-01 DIAGNOSIS — E876 Hypokalemia: Secondary | ICD-10-CM | POA: Diagnosis not present

## 2017-04-01 DIAGNOSIS — I2782 Chronic pulmonary embolism: Secondary | ICD-10-CM | POA: Diagnosis present

## 2017-04-01 DIAGNOSIS — I5022 Chronic systolic (congestive) heart failure: Secondary | ICD-10-CM | POA: Diagnosis not present

## 2017-04-01 DIAGNOSIS — R55 Syncope and collapse: Secondary | ICD-10-CM | POA: Diagnosis not present

## 2017-04-01 DIAGNOSIS — I110 Hypertensive heart disease with heart failure: Secondary | ICD-10-CM | POA: Diagnosis present

## 2017-04-01 DIAGNOSIS — Z888 Allergy status to other drugs, medicaments and biological substances status: Secondary | ICD-10-CM

## 2017-04-01 DIAGNOSIS — R7989 Other specified abnormal findings of blood chemistry: Secondary | ICD-10-CM

## 2017-04-01 DIAGNOSIS — R2681 Unsteadiness on feet: Secondary | ICD-10-CM | POA: Diagnosis present

## 2017-04-01 DIAGNOSIS — E785 Hyperlipidemia, unspecified: Secondary | ICD-10-CM | POA: Diagnosis present

## 2017-04-01 DIAGNOSIS — Z95810 Presence of automatic (implantable) cardiac defibrillator: Secondary | ICD-10-CM

## 2017-04-01 DIAGNOSIS — Z8679 Personal history of other diseases of the circulatory system: Secondary | ICD-10-CM

## 2017-04-01 DIAGNOSIS — R6889 Other general symptoms and signs: Secondary | ICD-10-CM | POA: Diagnosis not present

## 2017-04-01 DIAGNOSIS — R079 Chest pain, unspecified: Secondary | ICD-10-CM | POA: Diagnosis not present

## 2017-04-01 DIAGNOSIS — E119 Type 2 diabetes mellitus without complications: Secondary | ICD-10-CM | POA: Diagnosis present

## 2017-04-01 DIAGNOSIS — Z23 Encounter for immunization: Secondary | ICD-10-CM

## 2017-04-01 DIAGNOSIS — R531 Weakness: Secondary | ICD-10-CM | POA: Diagnosis not present

## 2017-04-01 DIAGNOSIS — J45909 Unspecified asthma, uncomplicated: Secondary | ICD-10-CM | POA: Diagnosis present

## 2017-04-01 DIAGNOSIS — R296 Repeated falls: Secondary | ICD-10-CM | POA: Diagnosis present

## 2017-04-01 DIAGNOSIS — R17 Unspecified jaundice: Secondary | ICD-10-CM | POA: Diagnosis not present

## 2017-04-01 DIAGNOSIS — Z833 Family history of diabetes mellitus: Secondary | ICD-10-CM

## 2017-04-01 DIAGNOSIS — E860 Dehydration: Secondary | ICD-10-CM | POA: Diagnosis not present

## 2017-04-01 DIAGNOSIS — I482 Chronic atrial fibrillation, unspecified: Secondary | ICD-10-CM | POA: Diagnosis present

## 2017-04-01 DIAGNOSIS — Z79899 Other long term (current) drug therapy: Secondary | ICD-10-CM

## 2017-04-01 DIAGNOSIS — W1830XA Fall on same level, unspecified, initial encounter: Secondary | ICD-10-CM | POA: Diagnosis present

## 2017-04-01 DIAGNOSIS — Z8249 Family history of ischemic heart disease and other diseases of the circulatory system: Secondary | ICD-10-CM

## 2017-04-01 DIAGNOSIS — H9190 Unspecified hearing loss, unspecified ear: Secondary | ICD-10-CM | POA: Diagnosis present

## 2017-04-01 DIAGNOSIS — D509 Iron deficiency anemia, unspecified: Secondary | ICD-10-CM | POA: Diagnosis present

## 2017-04-01 DIAGNOSIS — I2510 Atherosclerotic heart disease of native coronary artery without angina pectoris: Secondary | ICD-10-CM | POA: Diagnosis present

## 2017-04-01 DIAGNOSIS — Z87891 Personal history of nicotine dependence: Secondary | ICD-10-CM

## 2017-04-01 DIAGNOSIS — D696 Thrombocytopenia, unspecified: Secondary | ICD-10-CM | POA: Diagnosis present

## 2017-04-01 DIAGNOSIS — Y92000 Kitchen of unspecified non-institutional (private) residence as  the place of occurrence of the external cause: Secondary | ICD-10-CM

## 2017-04-01 DIAGNOSIS — I252 Old myocardial infarction: Secondary | ICD-10-CM

## 2017-04-01 DIAGNOSIS — R102 Pelvic and perineal pain: Secondary | ICD-10-CM | POA: Diagnosis not present

## 2017-04-01 LAB — COMPREHENSIVE METABOLIC PANEL
ALBUMIN: 3.7 g/dL (ref 3.5–5.0)
ALT: 11 U/L — ABNORMAL LOW (ref 17–63)
ANION GAP: 15 (ref 5–15)
AST: 31 U/L (ref 15–41)
Alkaline Phosphatase: 55 U/L (ref 38–126)
BILIRUBIN TOTAL: 2.6 mg/dL — AB (ref 0.3–1.2)
BUN: 31 mg/dL — ABNORMAL HIGH (ref 6–20)
CO2: 22 mmol/L (ref 22–32)
Calcium: 9 mg/dL (ref 8.9–10.3)
Chloride: 101 mmol/L (ref 101–111)
Creatinine, Ser: 1.88 mg/dL — ABNORMAL HIGH (ref 0.61–1.24)
GFR calc Af Amer: 37 mL/min — ABNORMAL LOW (ref >60)
GFR, EST NON AFRICAN AMERICAN: 32 mL/min — AB (ref >60)
Glucose, Bld: 133 mg/dL — ABNORMAL HIGH (ref 65–99)
POTASSIUM: 3.6 mmol/L (ref 3.5–5.1)
Sodium: 138 mmol/L (ref 135–145)
TOTAL PROTEIN: 6.5 g/dL (ref 6.5–8.1)

## 2017-04-01 LAB — CBC WITH DIFFERENTIAL/PLATELET
Basophils Absolute: 0 10*3/uL (ref 0.0–0.1)
Basophils Relative: 0 %
EOS PCT: 0 %
Eosinophils Absolute: 0 10*3/uL (ref 0.0–0.7)
HCT: 42.7 % (ref 39.0–52.0)
Hemoglobin: 14.1 g/dL (ref 13.0–17.0)
Lymphocytes Relative: 25 %
Lymphs Abs: 2.3 10*3/uL (ref 0.7–4.0)
MCH: 30.6 pg (ref 26.0–34.0)
MCHC: 33 g/dL (ref 30.0–36.0)
MCV: 92.6 fL (ref 78.0–100.0)
MONOS PCT: 19 %
Monocytes Absolute: 1.7 10*3/uL — ABNORMAL HIGH (ref 0.1–1.0)
NEUTROS PCT: 56 %
Neutro Abs: 5.1 10*3/uL (ref 1.7–7.7)
PLATELETS: 113 10*3/uL — AB (ref 150–400)
RBC: 4.61 MIL/uL (ref 4.22–5.81)
RDW: 16.3 % — ABNORMAL HIGH (ref 11.5–15.5)
WBC: 9.1 10*3/uL (ref 4.0–10.5)

## 2017-04-01 LAB — URINALYSIS, ROUTINE W REFLEX MICROSCOPIC
Bilirubin Urine: NEGATIVE
Glucose, UA: NEGATIVE mg/dL
Ketones, ur: 5 mg/dL — AB
Nitrite: NEGATIVE
Protein, ur: 100 mg/dL — AB
SPECIFIC GRAVITY, URINE: 1.025 (ref 1.005–1.030)
pH: 5 (ref 5.0–8.0)

## 2017-04-01 LAB — TROPONIN I: Troponin I: 0.06 ng/mL (ref <0.03)

## 2017-04-01 LAB — CK: CK TOTAL: 262 U/L (ref 49–397)

## 2017-04-01 LAB — DIGOXIN LEVEL: DIGOXIN LVL: 0.3 ng/mL — AB (ref 0.8–2.0)

## 2017-04-01 LAB — I-STAT TROPONIN, ED: TROPONIN I, POC: 0 ng/mL (ref 0.00–0.08)

## 2017-04-01 MED ORDER — ONDANSETRON HCL 4 MG PO TABS: 4.0000 mg | ORAL_TABLET | Freq: Four times a day (QID) | ORAL | Status: DC | PRN

## 2017-04-01 MED ORDER — SODIUM CHLORIDE 0.9 % IV BOLUS (SEPSIS)
1000.0000 mL | Freq: Once | INTRAVENOUS | Status: AC
  Administered 2017-04-01: 1000 mL via INTRAVENOUS

## 2017-04-01 MED ORDER — ACETAMINOPHEN 650 MG RE SUPP: 650.0000 mg | Freq: Four times a day (QID) | RECTAL | Status: DC | PRN

## 2017-04-01 MED ORDER — SODIUM CHLORIDE 0.9 % IV SOLN
INTRAVENOUS | Status: AC
  Administered 2017-04-01: 18:00:00 via INTRAVENOUS

## 2017-04-01 MED ORDER — ONDANSETRON HCL 4 MG/2ML IJ SOLN: 4.0000 mg | Freq: Four times a day (QID) | INTRAMUSCULAR | Status: DC | PRN

## 2017-04-01 MED ORDER — TETANUS-DIPHTH-ACELL PERTUSSIS 5-2.5-18.5 LF-MCG/0.5 IM SUSP
0.5000 mL | Freq: Once | INTRAMUSCULAR | Status: AC
  Administered 2017-04-01: 0.5 mL via INTRAMUSCULAR
  Filled 2017-04-01: qty 0.5

## 2017-04-01 MED ORDER — ACETAMINOPHEN 325 MG PO TABS
650.0000 mg | ORAL_TABLET | Freq: Four times a day (QID) | ORAL | Status: DC | PRN
  Administered 2017-04-01 – 2017-04-02 (×2): 650 mg via ORAL
  Filled 2017-04-01 (×2): qty 2

## 2017-04-01 NOTE — Progress Notes (Signed)
Patient placed on low bed  Rails x 4, bed alarm on. SCD's in place bilaterally.

## 2017-04-01 NOTE — ED Notes (Signed)
SN attempted In and Out cath with Prom, EMT; unsuccessful attempt; SN performed bladder scan with results yielding 52ml of urine in bladder.

## 2017-04-01 NOTE — ED Provider Notes (Signed)
[facility] EMERGENCY DEPARTMENT Provider Note   CSN: [id number] Arrival date & time: 04/01/17  1206     History   Chief Complaint Chief Complaint  Patient presents with  . Fall    HPI [name] is a 84 y.o. male history of A. fib not on anticoagulation due to previous subdural hemorrhage, diabetes, hypertension here presenting with unwitnessed fall.  Patient lives at home by himself and has a friend that visits him regularly.  Last normal was sometime yesterday afternoon when a friend came to visit.  This morning, the friend came over and he was noted to be on the floor in his kitchen.  Patient cannot tell me how he fell and was noted to be confused per EMS.  Patient was admitted several weeks ago for pulmonary embolus but was not started on blood thinners due to recurrent fall risks and previous subdural hemorrhage.  Patient denies any active chest pain or trouble breathing.   The history is provided by the patient.    Past Medical History:  Diagnosis Date  . Abnormal thyroid scan    Abnormal thyroid imaging studies from 11/09/2010, status post ultrasound guided fine needle aspiration of the dominant left inferior thyroid nodule on 12/15/2010. Cytology report showed rare follicular epithelial cells and hemosiderin laden macrophages.  Marland Kitchen AICD (automatic cardioverter/defibrillator) present    a. fx lead; a. s/p lead extraction 03/02/17  . Arthritis    "all over"  . Asthma   . Atrial fibrillation ([place])    on chronic Coumadin; stopped July 2013 due to subdural hematomas  . CHF (congestive heart failure) (HCC)    EF 35-40% s/p most recent ICD generator change-out with Medtronic dual-chamber ICD 05/20/11 with explantation of previous abdominally-implanted device  . Coronary artery disease    s/p CABG 1983 and PCI/stent 2004.   . Diabetes mellitus    diet controlled  . Dyslipidemia   . Erythrocytosis   . GERD (gastroesophageal reflux disease)   .  Hypertension   . Ischemic cardiomyopathy    WITH CHF  . Monocytosis 04/17/2013  . Myocardial infarction ([place]) 1983; ~ 1990  . Pneumonia August 2013  . Subdural hematoma [facility]) July 2013   Anticoagulation stopped.   . SunDown syndrome   . VT (ventricular tachycardia) [facility])     Patient Active Problem List   Diagnosis Date Noted  . Chronic pulmonary embolism ([place]) 03/27/2017  . Acute confusion 03/04/2017  . Failure of implantable cardioverter-defibrillator (ICD) lead 03/02/2017  . Rectal ulcer   . Rectal bleeding   . Proctitis 02/07/2017  . Abdominal pain 02/06/2017  . Acute radiation proctitis 02/06/2017  . Failure of implantable cardioverter-defibrillator lead 11/10/2016  . ICD (implantable cardioverter-defibrillator) in place 04/08/2016  . Gastroesophageal reflux disease without esophagitis 12/31/2013  . Hypotension 09/23/2011  . HCAP (healthcare-associated pneumonia) 09/23/2011  . SIRS (systemic inflammatory response syndrome) ([place]) 09/23/2011  . Thrombocytopenia ([place]) 09/23/2011  . History of subdural hemorrhage 09/08/2011  . Ataxia 08/17/2011  . Dehydration 08/17/2011  . Atrial fibrillation with controlled ventricular response ([place]) 08/13/2011  . Colon polyp 07/07/2011  . Angiodysplasia of colon 07/07/2011  . GERD (gastroesophageal reflux disease) 05/31/2011  . Anemia, iron deficiency 12/29/2010  . Chronic anticoagulation discontinued July 2013 after developed subdural hematoma 09/22/2010  . Bradycardia 09/22/2010  . CAD (coronary artery disease) 06/25/2010  . ventricular tachycardia 12/30/2009  . Chronic atrial fibrillation ([place]) 12/30/2009  . Chronic systolic heart failure- EF 35-40% 12/30/2009  . Automatic implantable cardioverter-defibrillator  in situ 12/30/2009    Past Surgical History:  Procedure Laterality Date  . CHOLECYSTECTOMY    . COLONOSCOPY  07/07/2011   Procedure: COLONOSCOPY;  Surgeon: Jerene Bears, MD;  Location: [facility] ENDOSCOPY;  Service:  Gastroenterology;  Laterality: N/A;  . CORONARY ANGIOPLASTY WITH STENT PLACEMENT  2004   Tandem Cypher stents LAD  . CORONARY ARTERY BYPASS GRAFT  1983   SVG-mLAD  . ESOPHAGOGASTRODUODENOSCOPY  02/11/2011   Procedure: ESOPHAGOGASTRODUODENOSCOPY (EGD);  Surgeon: Beryle Beams, MD;  Location: [facility];  Service: Endoscopy;  Laterality: N/A;  . FLEXIBLE SIGMOIDOSCOPY N/A 02/08/2017   Procedure: FLEXIBLE SIGMOIDOSCOPY;  Surgeon: Irene Shipper, MD;  Location: [facility];  Service: Endoscopy;  Laterality: N/A;  . ICD GENERATOR CHANGEOUT N/A 04/08/2016   Procedure: ICD Generator Changeout;  Surgeon: Evans Lance, MD;  Location: [facility];  Service: Cardiovascular;  Laterality: N/A;  . ICD LEAD REMOVAL N/A 03/02/2017   Procedure: ICD LEAD REMOVAL;  Surgeon: Evans Lance, MD;  Location: [facility];  Service: Cardiovascular;  Laterality: N/A;  . IMPLANTABLE CARDIOVERTER DEFIBRILLATOR (ICD) GENERATOR CHANGE N/A 05/20/2011   Procedure: ICD GENERATOR CHANGE;  Surgeon: Evans Lance, MD;  Medtronic secure dual-chamber ICD serial number [id number]   . KNEE ARTHROSCOPY     right; "just went in and scraped it"  . LEAD INSERTION N/A 03/02/2017   Procedure: LEAD INSERTION;  Surgeon: Evans Lance, MD;  Location: [facility];  Service: Cardiovascular;  Laterality: N/A;  . MASS EXCISION Right 05/10/2013   Procedure: EXCISION MASS RIGHT THUMB;  Surgeon: Wynonia Sours, MD;  Location: [facility];  Service: Orthopedics;  Laterality: Right;  . PROXIMAL INTERPHALANGEAL FUSION (PIP) Right 05/10/2013   Procedure: DEBRIDEMENT PROXIMAL INTERPHALANGEAL FUSION (PIP);  Surgeon: Wynonia Sours, MD;  Location: [place];  Service: Orthopedics;  Laterality: Right;  . TONSILLECTOMY             Home Medications    Prior to Admission medications   Medication Sig Start Date End Date Taking? Authorizing Provider  ADVAIR DISKUS 250-50 MCG/DOSE AEPB Inhale 1 puff into the lungs 2 (two) times  daily.  11/13/14   [provider]  calcium carbonate (TUMS - DOSED IN MG ELEMENTAL CALCIUM) 500 MG chewable tablet Chew 1 tablet by mouth daily as needed for heartburn.     [provider]  digoxin (LANOXIN) 0.125 MG tablet Take 1 tablet (0.125 mg total) by mouth daily. 02/10/17   Janece Canterbury, MD  fluticasone (FLONASE) 50 MCG/ACT nasal spray Place 2 sprays into both nostrils daily. Patient taking differently: Place 1 spray into both nostrils daily as needed for allergies.  01/04/17   [name], MD  furosemide (LASIX) 40 MG tablet Take 1 tablet (40 mg total) by mouth daily. May take an extra tab daily as needed for swelling 01/04/17   [name], MD  gabapentin (NEURONTIN) 100 MG capsule TAKE 1 CAPSULE (100MG ) TO 3 CAPSULES (300MG ) EACH NIGHT AS NEEDED FOR MUSCLE PAINS 10/05/16   [name], MD  LINZESS 72 MCG capsule Take 72 mcg by mouth daily as needed (for constipation).  03/07/16   [provider]  loratadine (CLARITIN) 10 MG tablet Take 10 mg by mouth daily as needed for allergies.     [provider]  metoprolol succinate (TOPROL-XL) 25 MG 24 hr tablet Take 1 tablet (25 mg total) by mouth daily. 02/10/17   Janece Canterbury, MD  nitroGLYCERIN (NITROSTAT)  0.4 MG SL tablet PLACE ONE UNDER TONGUE FOR CHEST PAIN. Patient taking differently: Place 0.4 mg under tongue as needed for chest pain 11/22/16   [name], Peter M, MD  polyethylene glycol [facility]) packet Take 17 g by mouth daily as needed for mild constipation. 02/10/17   Janece Canterbury, MD  potassium chloride (K-DUR,KLOR-CON) 10 MEQ tablet Take 1 tablet (10 mEq total) by mouth daily. 01/09/13   [name], Peter M, MD  ranitidine (ZANTAC) 150 MG tablet Take 1 tablet (150 mg total) 2 (two) times daily by mouth. 12/20/16   [name], MD  RESTASIS 0.05 % ophthalmic emulsion INSTILL 1 DROP INTO BOTH EYES TWICE A DAY 11/17/16   [name], MD  traMADol (ULTRAM) 50 MG tablet TAKE 2  TABLETS BY MOUTH EVERY 8 HOURS AS NEEDED FOR PAIN 03/24/17   [name], MD    Family History Family History  Problem Relation Age of Onset  . Tuberculosis Mother   . Tuberculosis Father   . Heart disease Brother   . Diabetes Sister   . Diabetes Brother   . Clotting disorder Brother     Social History Social History   Tobacco Use  . Smoking status: Former Smoker    Packs/day: 2.00    Years: 30.00    Pack years: 60.00    Types: Cigarettes    Last attempt to quit: 06/23/1976    Years since quitting: 40.8  . Smokeless tobacco: Never Used  Substance Use Topics  . Alcohol use: No  . Drug use: No     Allergies   Tamsulosin; Celebrex [celecoxib]; Dronedarone; Esomeprazole magnesium; Digoxin; Hydrocodone-acetaminophen; and Protonix [pantoprazole sodium]   Review of Systems Review of Systems  Neurological: Positive for weakness.  All other systems reviewed and are negative.    Physical Exam Updated Vital Signs BP 126/74   Pulse (!) 118   Temp 98 F (36.7 C) (Oral)   Resp (!) 23   SpO2 96%   Physical Exam  Constitutional:  Chronically ill   HENT:  Head: Normocephalic.  No obvious scalp hematoma   Eyes: EOM are normal. Pupils are equal, round, and reactive to light.  Neck:  C collar in place   Cardiovascular: Normal rate, regular rhythm and normal heart sounds.  Pulmonary/Chest: Effort normal and breath sounds normal. No stridor. No respiratory distress. He has no wheezes.  Abdominal: Soft. Bowel sounds are normal. He exhibits no distension. There is no tenderness. There is no guarding.  Musculoskeletal:  No obvious midline tenderness. Able to range bilateral hips. Skin tears on R forearm but no obvious bony tenderness or deformity   Neurological:  A & O x 2. Moving all extremities. No obvious facial droop   Skin: Skin is warm.  Psychiatric: He has a normal mood and affect.  Nursing note and vitals reviewed.    ED Treatments / Results  Labs (all labs  ordered are listed, but only abnormal results are displayed) Labs Reviewed  COMPREHENSIVE METABOLIC PANEL - Abnormal; Notable for the following components:      Result Value   Glucose, Bld 133 (*)    BUN 31 (*)    Creatinine, Ser 1.88 (*)    ALT 11 (*)    Total Bilirubin 2.6 (*)    GFR calc non Af Amer 32 (*)    GFR calc Af Amer 37 (*)    All other components within normal limits  DIGOXIN LEVEL - Abnormal; Notable for the following  components:   Digoxin Level 0.3 (*)    All other components within normal limits  CBC WITH DIFFERENTIAL/PLATELET - Abnormal; Notable for the following components:   RDW 16.3 (*)    All other components within normal limits  CK  CBC WITH DIFFERENTIAL/PLATELET  URINALYSIS, ROUTINE W REFLEX MICROSCOPIC  I-STAT TROPONIN, ED    EKG  EKG Interpretation  Date/Time:  Saturday April 01 2017 12:12:31 EST Ventricular Rate:  101 PR Interval:    QRS Duration: 95 QT Interval:  373 QTC Calculation: 484 R Axis:   48 Text Interpretation:  Atrial fibrillation Ventricular premature complex Low voltage, extremity leads Borderline repolarization abnormality Borderline prolonged QT interval Artifact in lead(s) I II III aVR aVL aVF V1 V2 No significant change since last tracing Confirmed by Wandra Arthurs [phone]) on 04/01/2017 12:19:34 PM Also confirmed by Wandra Arthurs [phone]), editor Laurena Spies [phone])  on 04/01/2017 2:35:35 PM       Radiology Dg Chest 2 View  Result Date: 04/01/2017 CLINICAL DATA:  Pain after fall EXAM: CHEST  2 VIEW COMPARISON:  March 25, 2017 FINDINGS: Postsurgical changes in the chest with sternotomy wires. Mild cardiomegaly. The hila and mediastinum are normal. No pneumothorax. No nodules or masses. No focal infiltrates. IMPRESSION: No active cardiopulmonary disease. Electronically Signed   By: Dorise Bullion III M.D   On: 04/01/2017 12:42   Dg Pelvis 1-2 Views  Result Date: 04/01/2017 CLINICAL DATA:  Fall.  Pain. EXAM: PELVIS - 1-2 VIEW  COMPARISON:  None. FINDINGS: Degenerative changes in the hips, right greater than left. No acute fractures are seen. IMPRESSION: Degenerative changes in the hips, right greater than left. Electronically Signed   By: Dorise Bullion III M.D   On: 04/01/2017 12:43   Ct Head Wo Contrast  Result Date: 04/01/2017 CLINICAL DATA:  Found down by a friend who last saw the patient yesterday. EXAM: CT HEAD WITHOUT CONTRAST CT CERVICAL SPINE WITHOUT CONTRAST TECHNIQUE: Multidetector CT imaging of the head and cervical spine was performed following the standard protocol without intravenous contrast. Multiplanar CT image reconstructions of the cervical spine were also generated. COMPARISON:  Head CT - 03/26/2017; 03/24/2015; 09/10/2012 FINDINGS: CT HEAD FINDINGS Brain: Similar findings atrophy with sulcal prominence. Scattered periventricular hypodensities compatible microvascular ischemic disease, unchanged. Given background parenchymal abnormalities, there is no CT evidence of superimposed acute large territory infarct. No intraparenchymal or extra-axial mass or hemorrhage. Unchanged size and configuration of the ventricles and the basilar cisterns. No midline shift. Vascular: Intracranial atherosclerosis. Skull: No displaced calvarial fracture. Sinuses/Orbits: Limited visualization of the paranasal sinuses and mastoid air cells is normal. No air-fluid levels. Other: Regional soft tissues appear normal. CT CERVICAL SPINE FINDINGS Alignment: C1 to the superior endplate of T2 is imaged. There is straightening expected cervical lordosis. No anterolisthesis or retrolisthesis. The dens is normally positioned between the lateral masses of C1. Mild-to-moderate degenerative change of the atlantodental articulation. Normal atlantoaxial articulations. Skull base and vertebrae: Cervical vertebral body heights are preserved. Prevertebral soft tissues are normal. Soft tissues and spinal canal: Regional soft tissues appear normal. No  bulky cervical lymphadenopathy on this noncontrast examination. There is partial ossification the nuchal ligament. Disc levels: There is multi level flowing osteophytosis and ankylosis throughout the cervical spine. Prominent posteriorly directed disc osteophyte complexes are seen most conspicuously at C3-C4 (resulting in at least moderate spinal stenosis at this location-axial image 39, series 8; sagittal image 28, series 4), and to a lesser extent, C5-C6 and C4-C5. Upper chest: Limited  visualization the lung apices demonstrates minimal biapical pleuroparenchymal thickening. Other: Note is made of a large (approximately 3.8 x 2.8 cm nodule/mass nearly replacing the left lobe of the thyroid which is noted to contain several internal calcifications. Heterogeneous appearance of the imaged portions of the right lobe of the thyroid. IMPRESSION: 1. Similar findings of atrophy and microvascular ischemic disease without superimposed acute intracranial process. 2. No fracture or static subluxation of the cervical spine. 3. Multi level flowing osteophytosis and ankylosis throughout the cervical spine compatible with DISH. 4. Prominent posteriorly directed disc osteophyte complexes are seen throughout the cervical spine, worse at C3-C4, resulting at least moderate spinal stenosis at this location. Further evaluation with dedicated nonemergent cervical spine MRI performed as indicated. 5. Incidentally noted approximately 3.8 cm nodule/mass nearly replacing the left lobe of the thyroid. Further evaluation with dedicated nonemergent thyroid ultrasound could be performed as clinically indicated. Electronically Signed   By: Sandi Mariscal M.D.   On: 04/01/2017 13:21   Ct Cervical Spine Wo Contrast  Result Date: 04/01/2017 CLINICAL DATA:  Found down by a friend who last saw the patient yesterday. EXAM: CT HEAD WITHOUT CONTRAST CT CERVICAL SPINE WITHOUT CONTRAST TECHNIQUE: Multidetector CT imaging of the head and cervical spine was  performed following the standard protocol without intravenous contrast. Multiplanar CT image reconstructions of the cervical spine were also generated. COMPARISON:  Head CT - 03/26/2017; 03/24/2015; 09/10/2012 FINDINGS: CT HEAD FINDINGS Brain: Similar findings atrophy with sulcal prominence. Scattered periventricular hypodensities compatible microvascular ischemic disease, unchanged. Given background parenchymal abnormalities, there is no CT evidence of superimposed acute large territory infarct. No intraparenchymal or extra-axial mass or hemorrhage. Unchanged size and configuration of the ventricles and the basilar cisterns. No midline shift. Vascular: Intracranial atherosclerosis. Skull: No displaced calvarial fracture. Sinuses/Orbits: Limited visualization of the paranasal sinuses and mastoid air cells is normal. No air-fluid levels. Other: Regional soft tissues appear normal. CT CERVICAL SPINE FINDINGS Alignment: C1 to the superior endplate of T2 is imaged. There is straightening expected cervical lordosis. No anterolisthesis or retrolisthesis. The dens is normally positioned between the lateral masses of C1. Mild-to-moderate degenerative change of the atlantodental articulation. Normal atlantoaxial articulations. Skull base and vertebrae: Cervical vertebral body heights are preserved. Prevertebral soft tissues are normal. Soft tissues and spinal canal: Regional soft tissues appear normal. No bulky cervical lymphadenopathy on this noncontrast examination. There is partial ossification the nuchal ligament. Disc levels: There is multi level flowing osteophytosis and ankylosis throughout the cervical spine. Prominent posteriorly directed disc osteophyte complexes are seen most conspicuously at C3-C4 (resulting in at least moderate spinal stenosis at this location-axial image 39, series 8; sagittal image 28, series 4), and to a lesser extent, C5-C6 and C4-C5. Upper chest: Limited visualization the lung apices  demonstrates minimal biapical pleuroparenchymal thickening. Other: Note is made of a large (approximately 3.8 x 2.8 cm nodule/mass nearly replacing the left lobe of the thyroid which is noted to contain several internal calcifications. Heterogeneous appearance of the imaged portions of the right lobe of the thyroid. IMPRESSION: 1. Similar findings of atrophy and microvascular ischemic disease without superimposed acute intracranial process. 2. No fracture or static subluxation of the cervical spine. 3. Multi level flowing osteophytosis and ankylosis throughout the cervical spine compatible with DISH. 4. Prominent posteriorly directed disc osteophyte complexes are seen throughout the cervical spine, worse at C3-C4, resulting at least moderate spinal stenosis at this location. Further evaluation with dedicated nonemergent cervical spine MRI performed as indicated. 5. Incidentally noted approximately 3.8  cm nodule/mass nearly replacing the left lobe of the thyroid. Further evaluation with dedicated nonemergent thyroid ultrasound could be performed as clinically indicated. Electronically Signed   By: Sandi Mariscal M.D.   On: 04/01/2017 13:21    Procedures Procedures (including critical care time)  Medications Ordered in ED Medications  sodium chloride 0.9 % bolus 1,000 mL (not administered)  sodium chloride 0.9 % bolus 1,000 mL (0 mLs Intravenous Stopped 04/01/17 1408)  Tdap (BOOSTRIX) injection 0.5 mL (0.5 mLs Intramuscular Given 04/01/17 1309)     Initial Impression / Assessment and Plan / ED Course  I have reviewed the triage vital signs and the nursing notes.  Pertinent labs & imaging results that were available during my care of the patient were reviewed by me and considered in my medical decision making (see chart for details).     HALVOR BEHREND is a 84 y.o. male here with fall. Unwitnessed fall at hall and has been on the floor since yesterday. Consider rhabdomyolysis vs syncope. PE is possible  as well but patient was recently admitted for PE and wasn't started on blood thinners due to recurrent falls so I don't think that another CT PE study would change management. He is not hypoxic and is just borderline tachycardic. Will get labs, CT head/neck, xrays, CK level. Will hydrate and reassess.   3:03 PM Labs showed AKI with Cr 1.88. CT head/neck unremarkable. CK level normal. Will admit for possible syncope, AKI. I noticed social work notes regarding placement to SNF. Will have social work and case management talk to him again about it.    Final Clinical Impressions(s) / ED Diagnoses   Final diagnoses:  None    ED Discharge Orders    None       Drenda Freeze, MD 04/01/17 1504

## 2017-04-01 NOTE — H&P (Signed)
History and Physical    [name] UUV:[id number] DOB: 29-May-1932 DOA: 04/01/2017  PCP: [name], MD Patient coming from: home  Chief Complaint: fall  HPI: [name] is a very pleasant 84 y.o. male with medical history significant for afib not on anticoagulation due to prior subdural hemorrhage, diabetes, hypertension presents to emergency Department chief complaint unwitnessed fall. Initial evaluation reveals acute kidney injury dehydration. [facility] are asked to admit  Information is obtained from the patient and his neighbor who is at the bedside. Neighbor says he goes over to the house a couple days a week to "check on him". He went this morning and patient did not answer the door. The neighbor no solid patient on the kitchen floor. Patient was unable to call to door to unlock it but he was conscious and conversing with the neighbor. Neighbor found and then locked window and entered the house. EMS was called and patient was transported to the emergency department. Patient has no recollection of how he ended up on the floor and is not sure how long he was on the floor. He denies headache dizziness syncope or near-syncope. He denies chest pain palpitations shortness of breath. He denies nausea vomiting diarrhea. Neighbor indicates there was no indication that patient had been eating and the house was "dirty".  Of note patient was admitted several weeks ago for pulmonary embolus but was not started on blood thinners at that time due to high fall risk and previous subdural hemorrhage.  ED Course: In the emergency department he's afebrile hemodynamically stable with a blood pressure the high and of normal he is tachycardic  Review of Systems: As per HPI otherwise all other systems reviewed and are negative.   Ambulatory Status: Patient is unsteady on his feet needs a walker and/or cane. Recently evaluated by physical therapy who recommended snuff and patient  refused  Past Medical History:  Diagnosis Date  . Abnormal thyroid scan    Abnormal thyroid imaging studies from 11/09/2010, status post ultrasound guided fine needle aspiration of the dominant left inferior thyroid nodule on 12/15/2010. Cytology report showed rare follicular epithelial cells and hemosiderin laden macrophages.  Marland Kitchen AICD (automatic cardioverter/defibrillator) present    a. fx lead; a. s/p lead extraction 03/02/17  . Arthritis    "all over"  . Asthma   . Atrial fibrillation ([place])    on chronic Coumadin; stopped July 2013 due to subdural hematomas  . CHF (congestive heart failure) (HCC)    EF 35-40% s/p most recent ICD generator change-out with Medtronic dual-chamber ICD 05/20/11 with explantation of previous abdominally-implanted device  . Coronary artery disease    s/p CABG 1983 and PCI/stent 2004.   . Diabetes mellitus    diet controlled  . Dyslipidemia   . Erythrocytosis   . GERD (gastroesophageal reflux disease)   . Hypertension   . Ischemic cardiomyopathy    WITH CHF  . Monocytosis 04/17/2013  . Myocardial infarction ([place]) 1983; ~ 1990  . Pneumonia August 2013  . Subdural hematoma [facility]) July 2013   Anticoagulation stopped.   . SunDown syndrome   . VT (ventricular tachycardia) ([place])     Past Surgical History:  Procedure Laterality Date  . CHOLECYSTECTOMY    . COLONOSCOPY  07/07/2011   Procedure: COLONOSCOPY;  Surgeon: Jerene Bears, MD;  Location: [facility];  Service: Gastroenterology;  Laterality: N/A;  . CORONARY ANGIOPLASTY WITH STENT PLACEMENT  2004   Tandem Cypher stents LAD  . CORONARY ARTERY  BYPASS GRAFT  1983   SVG-mLAD  . ESOPHAGOGASTRODUODENOSCOPY  02/11/2011   Procedure: ESOPHAGOGASTRODUODENOSCOPY (EGD);  Surgeon: Beryle Beams, MD;  Location: [facility];  Service: Endoscopy;  Laterality: N/A;  . FLEXIBLE SIGMOIDOSCOPY N/A 02/08/2017   Procedure: FLEXIBLE SIGMOIDOSCOPY;  Surgeon: Irene Shipper, MD;  Location: [facility];  Service: Endoscopy;   Laterality: N/A;  . ICD GENERATOR CHANGEOUT N/A 04/08/2016   Procedure: ICD Generator Changeout;  Surgeon: Evans Lance, MD;  Location: [facility];  Service: Cardiovascular;  Laterality: N/A;  . ICD LEAD REMOVAL N/A 03/02/2017   Procedure: ICD LEAD REMOVAL;  Surgeon: Evans Lance, MD;  Location: [place];  Service: Cardiovascular;  Laterality: N/A;  . IMPLANTABLE CARDIOVERTER DEFIBRILLATOR (ICD) GENERATOR CHANGE N/A 05/20/2011   Procedure: ICD GENERATOR CHANGE;  Surgeon: Evans Lance, MD;  Medtronic secure dual-chamber ICD serial number [id number]   . KNEE ARTHROSCOPY     right; "just went in and scraped it"  . LEAD INSERTION N/A 03/02/2017   Procedure: LEAD INSERTION;  Surgeon: Evans Lance, MD;  Location: [place];  Service: Cardiovascular;  Laterality: N/A;  . MASS EXCISION Right 05/10/2013   Procedure: EXCISION MASS RIGHT THUMB;  Surgeon: Wynonia Sours, MD;  Location: [place];  Service: Orthopedics;  Laterality: Right;  . PROXIMAL INTERPHALANGEAL FUSION (PIP) Right 05/10/2013   Procedure: DEBRIDEMENT PROXIMAL INTERPHALANGEAL FUSION (PIP);  Surgeon: Wynonia Sours, MD;  Location: [place];  Service: Orthopedics;  Laterality: Right;  . TONSILLECTOMY          Social History   Socioeconomic History  . Marital status: Divorced    Spouse name: Not on file  . Number of children: 2  . Years of education: Not on file  . Highest education level: Not on file  Social Needs  . Financial resource strain: Not on file  . Food insecurity - worry: Not on file  . Food insecurity - inability: Not on file  . Transportation needs - medical: Not on file  . Transportation needs - non-medical: Not on file  Occupational History  . Occupation: Sports coach: RETIRED  Tobacco Use  . Smoking status: Former Smoker    Packs/day: 2.00    Years: 30.00    Pack years: 60.00    Types: Cigarettes    Last attempt to quit: 06/23/1976    Years since quitting: 40.8   . Smokeless tobacco: Never Used  Substance and Sexual Activity  . Alcohol use: No  . Drug use: No  . Sexual activity: No  Other Topics Concern  . Not on file  Social History Narrative  . Not on file    Allergies  Allergen Reactions  . Tamsulosin Other (See Comments)    Dizziness, Made BP very low and weakness  . Celebrex [Celecoxib] Hives and Nausea And Vomiting    Gi upset  . Dronedarone Nausea And Vomiting and Other (See Comments)    GI upset, abdominal pain  . Esomeprazole Magnesium Hives and Other (See Comments)    "don't really remember"  . Digoxin Diarrhea    May have caused some diarrhea  . Hydrocodone-Acetaminophen Other (See Comments)    Bad headache  . Protonix [Pantoprazole Sodium] Nausea And Vomiting and Other (See Comments)    Tolerates Dexilant    Family History  Problem Relation Age of Onset  . Tuberculosis Mother   . Tuberculosis Father   . Heart disease Brother   .  Diabetes Sister   . Diabetes Brother   . Clotting disorder Brother     Prior to Admission medications   Medication Sig Start Date End Date Taking? Authorizing Provider  ADVAIR DISKUS 250-50 MCG/DOSE AEPB Inhale 1 puff into the lungs 2 (two) times daily.  11/13/14   [provider]  calcium carbonate (TUMS - DOSED IN MG ELEMENTAL CALCIUM) 500 MG chewable tablet Chew 1 tablet by mouth daily as needed for heartburn.     [provider]  digoxin (LANOXIN) 0.125 MG tablet Take 1 tablet (0.125 mg total) by mouth daily. 02/10/17   Janece Canterbury, MD  fluticasone (FLONASE) 50 MCG/ACT nasal spray Place 2 sprays into both nostrils daily. Patient taking differently: Place 1 spray into both nostrils daily as needed for allergies.  01/04/17   [name], MD  furosemide (LASIX) 40 MG tablet Take 1 tablet (40 mg total) by mouth daily. May take an extra tab daily as needed for swelling 01/04/17   [name], MD  gabapentin (NEURONTIN) 100 MG capsule TAKE 1 CAPSULE (100MG ) TO 3  CAPSULES (300MG ) EACH NIGHT AS NEEDED FOR MUSCLE PAINS 10/05/16   [name], MD  LINZESS 72 MCG capsule Take 72 mcg by mouth daily as needed (for constipation).  03/07/16   [provider]  loratadine (CLARITIN) 10 MG tablet Take 10 mg by mouth daily as needed for allergies.     [provider]  metoprolol succinate (TOPROL-XL) 25 MG 24 hr tablet Take 1 tablet (25 mg total) by mouth daily. 02/10/17   Janece Canterbury, MD  nitroGLYCERIN (NITROSTAT) 0.4 MG SL tablet PLACE ONE UNDER TONGUE FOR CHEST PAIN. Patient taking differently: Place 0.4 mg under tongue as needed for chest pain 11/22/16   [name], Peter M, MD  polyethylene glycol [employer] / Floria Raveling) packet Take 17 g by mouth daily as needed for mild constipation. 02/10/17   Janece Canterbury, MD  potassium chloride (K-DUR,KLOR-CON) 10 MEQ tablet Take 1 tablet (10 mEq total) by mouth daily. 01/09/13   [name], Peter M, MD  ranitidine (ZANTAC) 150 MG tablet Take 1 tablet (150 mg total) 2 (two) times daily by mouth. 12/20/16   [name], MD  RESTASIS 0.05 % ophthalmic emulsion INSTILL 1 DROP INTO BOTH EYES TWICE A DAY 11/17/16   [name], MD  traMADol (ULTRAM) 50 MG tablet TAKE 2 TABLETS BY MOUTH EVERY 8 HOURS AS NEEDED FOR PAIN 03/24/17   [name], MD    Physical Exam: Vitals:   04/01/17 1315 04/01/17 1415 04/01/17 1430 04/01/17 1500  BP: 138/60 126/74 136/75 133/62  Pulse: (!) 197 (!) 118 100 (!) 114  Resp: (!) 23 (!) 23 (!) 25 (!) 25  Temp:      TempSrc:      SpO2: 100% 96% 97% 100%     General:  Appears calm and comfortable sitting up in bed slightly pale Eyes:  PERRL, EOMI, normal lids, iris ENT:  grossly normal hearing, lips & tongue, mucous membranes of his mouth are pink but dry skin on his lips is peeling Neck:  no LAD, masses or thyromegaly Cardiovascular:  Irregularly irregular, no m/r/g. No LE edema.  Respiratory:  CTA bilaterally, no w/r/r. Normal respiratory effort. Abdomen:  soft, ntnd,  positive bowel sounds throughout no guarding or rebounding Skin:  no rash or induration seen on limited exam skin tears on bilateral forearms bruising Musculoskeletal:  grossly normal tone BUE/BLE, good ROM, no bony abnormality Psychiatric:  grossly  normal mood and affect, speech fluent and appropriate, AOx3 Neurologic:  CN 2-12 grossly intact, moves all extremities in coordinated fashion, sensation intact  Labs on Admission: I have personally reviewed following labs and imaging studies  CBC: Recent Labs  Lab 03/27/17 1445 04/01/17 1424  WBC 7.6 9.1  NEUTROABS  --  5.1  HGB 14.4 14.1  HCT 43.4 42.7  MCV 91.9 92.6  PLT 165 784*   Basic Metabolic Panel: Recent Labs  Lab 03/26/17 1858 03/27/17 1445 03/29/17 0505 04/01/17 1209  NA 139  --  140 138  K 3.8  --  3.5 3.6  CL 99*  --  99* 101  CO2 24  --  30 22  GLUCOSE 101*  --  125* 133*  BUN 24*  --  16 31*  CREATININE 1.32* 1.19 1.15 1.88*  CALCIUM 9.5  --  9.4 9.0  MG  --   --  1.6*  --    GFR: Estimated Creatinine Clearance: 33.3 mL/min (A) (by C-G formula based on SCr of 1.88 mg/dL (H)). Liver Function Tests: Recent Labs  Lab 03/26/17 1858 04/01/17 1209  AST 23 31  ALT 10* 11*  ALKPHOS 62 55  BILITOT 1.7* 2.6*  PROT 7.0 6.5  ALBUMIN 4.2 3.7   Recent Labs  Lab 03/26/17 1858  LIPASE 28   No results for input(s): AMMONIA in the last 168 hours. Coagulation Profile: Recent Labs  Lab 03/26/17 1720  INR 1.48   Cardiac Enzymes: Recent Labs  Lab 04/01/17 1209  CKTOTAL 262   BNP (last 3 results) No results for input(s): PROBNP in the last 8760 hours. HbA1C: No results for input(s): HGBA1C in the last 72 hours. CBG: No results for input(s): GLUCAP in the last 168 hours. Lipid Profile: No results for input(s): CHOL, HDL, LDLCALC, TRIG, CHOLHDL, LDLDIRECT in the last 72 hours. Thyroid Function Tests: No results for input(s): TSH, T4TOTAL, FREET4, T3FREE, THYROIDAB in the last 72 hours. Anemia Panel: No  results for input(s): VITAMINB12, FOLATE, FERRITIN, TIBC, IRON, RETICCTPCT in the last 72 hours. Urine analysis:    Component Value Date/Time   COLORURINE YELLOW 03/26/2017 1720   APPEARANCEUR CLEAR 03/26/2017 1720   LABSPEC >1.046 (H) 03/26/2017 1720   PHURINE 6.0 03/26/2017 1720   GLUCOSEU NEGATIVE 03/26/2017 1720   HGBUR SMALL (A) 03/26/2017 1720   BILIRUBINUR NEGATIVE 03/26/2017 1720   KETONESUR 20 (A) 03/26/2017 1720   PROTEINUR NEGATIVE 03/26/2017 1720   UROBILINOGEN 0.2 09/09/2012 1501   NITRITE NEGATIVE 03/26/2017 1720   LEUKOCYTESUR NEGATIVE 03/26/2017 1720    Creatinine Clearance: Estimated Creatinine Clearance: 33.3 mL/min (A) (by C-G formula based on SCr of 1.88 mg/dL (H)).  Sepsis Labs: @LABRCNTIP (procalcitonin:4,lacticidven:4) ) Recent Results (from the past 240 hour(s))  Urine culture     Status: None   Collection Time: 03/26/17  5:20 PM  Result Value Ref Range Status   Specimen Description   Final    URINE, CLEAN CATCH Performed at [facility] [address] [place], [place] [address]    Special Requests   Final    NONE Performed at [facility], [place] [address], [place], [place] [address]    Culture   Final    NO GROWTH Performed at [facility], [place] [address], [place], [place] [address]    Report Status 03/28/2017 FINAL  Final     Radiological Exams on Admission: Dg Chest 2 View  Result Date: 04/01/2017 CLINICAL DATA:  Pain after fall  EXAM: CHEST  2 VIEW COMPARISON:  March 25, 2017 FINDINGS: Postsurgical changes in the chest with sternotomy wires. Mild cardiomegaly. The hila and mediastinum are normal. No pneumothorax. No nodules or masses. No focal infiltrates. IMPRESSION: No active cardiopulmonary disease. Electronically Signed   By: Dorise Bullion III M.D   On: 04/01/2017 12:42   Dg Pelvis 1-2 Views  Result Date: 04/01/2017 CLINICAL DATA:  Fall.  Pain. EXAM: PELVIS - 1-2 VIEW COMPARISON:  None.  FINDINGS: Degenerative changes in the hips, right greater than left. No acute fractures are seen. IMPRESSION: Degenerative changes in the hips, right greater than left. Electronically Signed   By: Dorise Bullion III M.D   On: 04/01/2017 12:43   Ct Head Wo Contrast  Result Date: 04/01/2017 CLINICAL DATA:  Found down by a friend who last saw the patient yesterday. EXAM: CT HEAD WITHOUT CONTRAST CT CERVICAL SPINE WITHOUT CONTRAST TECHNIQUE: Multidetector CT imaging of the head and cervical spine was performed following the standard protocol without intravenous contrast. Multiplanar CT image reconstructions of the cervical spine were also generated. COMPARISON:  Head CT - 03/26/2017; 03/24/2015; 09/10/2012 FINDINGS: CT HEAD FINDINGS Brain: Similar findings atrophy with sulcal prominence. Scattered periventricular hypodensities compatible microvascular ischemic disease, unchanged. Given background parenchymal abnormalities, there is no CT evidence of superimposed acute large territory infarct. No intraparenchymal or extra-axial mass or hemorrhage. Unchanged size and configuration of the ventricles and the basilar cisterns. No midline shift. Vascular: Intracranial atherosclerosis. Skull: No displaced calvarial fracture. Sinuses/Orbits: Limited visualization of the paranasal sinuses and mastoid air cells is normal. No air-fluid levels. Other: Regional soft tissues appear normal. CT CERVICAL SPINE FINDINGS Alignment: C1 to the superior endplate of T2 is imaged. There is straightening expected cervical lordosis. No anterolisthesis or retrolisthesis. The dens is normally positioned between the lateral masses of C1. Mild-to-moderate degenerative change of the atlantodental articulation. Normal atlantoaxial articulations. Skull base and vertebrae: Cervical vertebral body heights are preserved. Prevertebral soft tissues are normal. Soft tissues and spinal canal: Regional soft tissues appear normal. No bulky cervical  lymphadenopathy on this noncontrast examination. There is partial ossification the nuchal ligament. Disc levels: There is multi level flowing osteophytosis and ankylosis throughout the cervical spine. Prominent posteriorly directed disc osteophyte complexes are seen most conspicuously at C3-C4 (resulting in at least moderate spinal stenosis at this location-axial image 39, series 8; sagittal image 28, series 4), and to a lesser extent, C5-C6 and C4-C5. Upper chest: Limited visualization the lung apices demonstrates minimal biapical pleuroparenchymal thickening. Other: Note is made of a large (approximately 3.8 x 2.8 cm nodule/mass nearly replacing the left lobe of the thyroid which is noted to contain several internal calcifications. Heterogeneous appearance of the imaged portions of the right lobe of the thyroid. IMPRESSION: 1. Similar findings of atrophy and microvascular ischemic disease without superimposed acute intracranial process. 2. No fracture or static subluxation of the cervical spine. 3. Multi level flowing osteophytosis and ankylosis throughout the cervical spine compatible with DISH. 4. Prominent posteriorly directed disc osteophyte complexes are seen throughout the cervical spine, worse at C3-C4, resulting at least moderate spinal stenosis at this location. Further evaluation with dedicated nonemergent cervical spine MRI performed as indicated. 5. Incidentally noted approximately 3.8 cm nodule/mass nearly replacing the left lobe of the thyroid. Further evaluation with dedicated nonemergent thyroid ultrasound could be performed as clinically indicated. Electronically Signed   By: Sandi Mariscal M.D.   On: 04/01/2017 13:21   Ct Cervical Spine Wo Contrast  Result Date: 04/01/2017 CLINICAL  DATA:  Found down by a friend who last saw the patient yesterday. EXAM: CT HEAD WITHOUT CONTRAST CT CERVICAL SPINE WITHOUT CONTRAST TECHNIQUE: Multidetector CT imaging of the head and cervical spine was performed  following the standard protocol without intravenous contrast. Multiplanar CT image reconstructions of the cervical spine were also generated. COMPARISON:  Head CT - 03/26/2017; 03/24/2015; 09/10/2012 FINDINGS: CT HEAD FINDINGS Brain: Similar findings atrophy with sulcal prominence. Scattered periventricular hypodensities compatible microvascular ischemic disease, unchanged. Given background parenchymal abnormalities, there is no CT evidence of superimposed acute large territory infarct. No intraparenchymal or extra-axial mass or hemorrhage. Unchanged size and configuration of the ventricles and the basilar cisterns. No midline shift. Vascular: Intracranial atherosclerosis. Skull: No displaced calvarial fracture. Sinuses/Orbits: Limited visualization of the paranasal sinuses and mastoid air cells is normal. No air-fluid levels. Other: Regional soft tissues appear normal. CT CERVICAL SPINE FINDINGS Alignment: C1 to the superior endplate of T2 is imaged. There is straightening expected cervical lordosis. No anterolisthesis or retrolisthesis. The dens is normally positioned between the lateral masses of C1. Mild-to-moderate degenerative change of the atlantodental articulation. Normal atlantoaxial articulations. Skull base and vertebrae: Cervical vertebral body heights are preserved. Prevertebral soft tissues are normal. Soft tissues and spinal canal: Regional soft tissues appear normal. No bulky cervical lymphadenopathy on this noncontrast examination. There is partial ossification the nuchal ligament. Disc levels: There is multi level flowing osteophytosis and ankylosis throughout the cervical spine. Prominent posteriorly directed disc osteophyte complexes are seen most conspicuously at C3-C4 (resulting in at least moderate spinal stenosis at this location-axial image 39, series 8; sagittal image 28, series 4), and to a lesser extent, C5-C6 and C4-C5. Upper chest: Limited visualization the lung apices demonstrates  minimal biapical pleuroparenchymal thickening. Other: Note is made of a large (approximately 3.8 x 2.8 cm nodule/mass nearly replacing the left lobe of the thyroid which is noted to contain several internal calcifications. Heterogeneous appearance of the imaged portions of the right lobe of the thyroid. IMPRESSION: 1. Similar findings of atrophy and microvascular ischemic disease without superimposed acute intracranial process. 2. No fracture or static subluxation of the cervical spine. 3. Multi level flowing osteophytosis and ankylosis throughout the cervical spine compatible with DISH. 4. Prominent posteriorly directed disc osteophyte complexes are seen throughout the cervical spine, worse at C3-C4, resulting at least moderate spinal stenosis at this location. Further evaluation with dedicated nonemergent cervical spine MRI performed as indicated. 5. Incidentally noted approximately 3.8 cm nodule/mass nearly replacing the left lobe of the thyroid. Further evaluation with dedicated nonemergent thyroid ultrasound could be performed as clinically indicated. Electronically Signed   By: Sandi Mariscal M.D.   On: 04/01/2017 13:21    EKG: Atrial fibrillation Ventricular premature complex Low voltage, extremity leads Borderline repolarization abnormality Borderline prolonged QT interval    Assessment/Plan Principal Problem:   Acute kidney injury ([place]) Active Problems:   Chronic atrial fibrillation (HCC)   Chronic systolic heart failure- EF 35-40%   Anemia, iron deficiency   Dehydration   History of subdural hemorrhage   ICD (implantable cardioverter-defibrillator) in place   Chronic pulmonary embolism ([place])   Syncope   #1. Acute kidney injury. Likely related to dehydration secondary to decreased oral intake. Creatinine 1.88. 2 days ago creatinine within the limits of normal. -Admit -Gentle IV fluids -Hold nephrotoxins -Monitor urine output -Recheck in the morning  #2. Fall/syncope. Patient had an  unwitnessed fall but was conscious when found. His recollection of circumstances around fall unclear. Does not believe he syncopized  or hit his head. He has had an unsteady gait. Initial troponin negative. EKG without acute changes. No signs of infection. No metabolic derangement. CT of the head without acute abnormality. He was evaluated by physical therapy during recent hospitalization who recommended snuff and he refused. He has no family members friends who can assist him. -Gentle IV fluids -Obtain orthostatics -Cycle troponin -hold altering meds -Social worker to assist with placement  #3. Chronic systolic heart failure. EF 35%. Home medications include digoxin, metoprolol. Dig level below therapeutic -Continue dig -continue metoprolol -Hold Lasix for now -Monitor intake and output -obtain daily weights  #4. Chronic atrial fibrillation. Automatic implantable cardioverter-defibrillator in situ. Not on anticoagulation due to history of subdural hemorrhage and high fall risk. Heart rate 112 the point of admission. EKG as noted above. -Continue home meds -monitor  #5. Chronic pulmonary embolism. Patient recently hospitalized fall. Her cup revealed right-sided PE thought to be chronic but could not rule out acute. CCM opines likely old. He has no respiratory symptoms no hypoxemia. Recent lower shin many ultrasound duplex negative for DVT. Cardiology also consulted and opined patient poor candidate for anticoagulation -Monitor oxygen saturation level  #6. Hypertension. Controlled the emergency department. Home medications include metoprolol and Lasix -Holding Lasix for now -Continue beta blocker  #7. Review indicates patient threatened to leave AMA last hospitalization. He was evaluated by psych opined not capable of making decisions for himself. At that point his son was contacted and gave consent for patient to be discharged home    DVT prophylaxis: scd  Code Status: full  Family  Communication: none neighbor  Disposition Plan: needs snf  Consults called: none  Admission status: obs    Radene Gunning MD [facility]  If 7PM-7AM, please contact night-coverage [email or web address] Password [facility]  04/01/2017, 3:40 PM

## 2017-04-01 NOTE — ED Triage Notes (Signed)
Patient brought in by [facility] after being found on floor by friend, patient lives alone. Friend last spoke to patient and he seemed to be normal on 03/31/17 at 1500. Patient alert but slow to respond, oriented to self and situation. Arrives in c-collar with 20g saline lock in left forearm.

## 2017-04-01 NOTE — Progress Notes (Signed)
Eye glasses present with pt. No dentures. Also, regarding skin integrilty: bruise , purple in color noted to left lateral ankle.

## 2017-04-01 NOTE — Progress Notes (Signed)
Bed alarm on and secured and verified by off going and oncoming RN

## 2017-04-02 DIAGNOSIS — Z79899 Other long term (current) drug therapy: Secondary | ICD-10-CM | POA: Diagnosis not present

## 2017-04-02 DIAGNOSIS — I482 Chronic atrial fibrillation: Secondary | ICD-10-CM | POA: Diagnosis present

## 2017-04-02 DIAGNOSIS — D509 Iron deficiency anemia, unspecified: Secondary | ICD-10-CM | POA: Diagnosis present

## 2017-04-02 DIAGNOSIS — N179 Acute kidney failure, unspecified: Secondary | ICD-10-CM

## 2017-04-02 DIAGNOSIS — Z23 Encounter for immunization: Secondary | ICD-10-CM | POA: Diagnosis not present

## 2017-04-02 DIAGNOSIS — E119 Type 2 diabetes mellitus without complications: Secondary | ICD-10-CM | POA: Diagnosis present

## 2017-04-02 DIAGNOSIS — D696 Thrombocytopenia, unspecified: Secondary | ICD-10-CM | POA: Diagnosis present

## 2017-04-02 DIAGNOSIS — E876 Hypokalemia: Secondary | ICD-10-CM | POA: Diagnosis not present

## 2017-04-02 DIAGNOSIS — W1830XA Fall on same level, unspecified, initial encounter: Secondary | ICD-10-CM | POA: Diagnosis not present

## 2017-04-02 DIAGNOSIS — Z95810 Presence of automatic (implantable) cardiac defibrillator: Secondary | ICD-10-CM | POA: Diagnosis not present

## 2017-04-02 DIAGNOSIS — I110 Hypertensive heart disease with heart failure: Secondary | ICD-10-CM | POA: Diagnosis present

## 2017-04-02 DIAGNOSIS — J45909 Unspecified asthma, uncomplicated: Secondary | ICD-10-CM | POA: Diagnosis present

## 2017-04-02 DIAGNOSIS — E785 Hyperlipidemia, unspecified: Secondary | ICD-10-CM | POA: Diagnosis present

## 2017-04-02 DIAGNOSIS — K7689 Other specified diseases of liver: Secondary | ICD-10-CM | POA: Diagnosis not present

## 2017-04-02 DIAGNOSIS — I252 Old myocardial infarction: Secondary | ICD-10-CM | POA: Diagnosis not present

## 2017-04-02 DIAGNOSIS — I5022 Chronic systolic (congestive) heart failure: Secondary | ICD-10-CM | POA: Diagnosis present

## 2017-04-02 DIAGNOSIS — E860 Dehydration: Secondary | ICD-10-CM | POA: Diagnosis present

## 2017-04-02 DIAGNOSIS — R55 Syncope and collapse: Secondary | ICD-10-CM | POA: Diagnosis present

## 2017-04-02 DIAGNOSIS — Y92000 Kitchen of unspecified non-institutional (private) residence as  the place of occurrence of the external cause: Secondary | ICD-10-CM | POA: Diagnosis not present

## 2017-04-02 DIAGNOSIS — I2510 Atherosclerotic heart disease of native coronary artery without angina pectoris: Secondary | ICD-10-CM | POA: Diagnosis present

## 2017-04-02 DIAGNOSIS — M1990 Unspecified osteoarthritis, unspecified site: Secondary | ICD-10-CM | POA: Diagnosis present

## 2017-04-02 DIAGNOSIS — I2782 Chronic pulmonary embolism: Secondary | ICD-10-CM | POA: Diagnosis present

## 2017-04-02 DIAGNOSIS — R296 Repeated falls: Secondary | ICD-10-CM | POA: Diagnosis present

## 2017-04-02 DIAGNOSIS — H9190 Unspecified hearing loss, unspecified ear: Secondary | ICD-10-CM | POA: Diagnosis present

## 2017-04-02 DIAGNOSIS — R17 Unspecified jaundice: Secondary | ICD-10-CM | POA: Diagnosis not present

## 2017-04-02 DIAGNOSIS — K219 Gastro-esophageal reflux disease without esophagitis: Secondary | ICD-10-CM | POA: Diagnosis present

## 2017-04-02 DIAGNOSIS — R2681 Unsteadiness on feet: Secondary | ICD-10-CM | POA: Diagnosis present

## 2017-04-02 DIAGNOSIS — Z951 Presence of aortocoronary bypass graft: Secondary | ICD-10-CM | POA: Diagnosis not present

## 2017-04-02 LAB — BASIC METABOLIC PANEL
Anion gap: 11 (ref 5–15)
BUN: 24 mg/dL — ABNORMAL HIGH (ref 6–20)
CALCIUM: 8.6 mg/dL — AB (ref 8.9–10.3)
CO2: 23 mmol/L (ref 22–32)
CREATININE: 1.36 mg/dL — AB (ref 0.61–1.24)
Chloride: 106 mmol/L (ref 101–111)
GFR calc non Af Amer: 47 mL/min — ABNORMAL LOW (ref >60)
GFR, EST AFRICAN AMERICAN: 54 mL/min — AB (ref >60)
Glucose, Bld: 95 mg/dL (ref 65–99)
Potassium: 3.2 mmol/L — ABNORMAL LOW (ref 3.5–5.1)
SODIUM: 140 mmol/L (ref 135–145)

## 2017-04-02 LAB — CBC
HCT: 38.5 % — ABNORMAL LOW (ref 39.0–52.0)
Hemoglobin: 12.5 g/dL — ABNORMAL LOW (ref 13.0–17.0)
MCH: 30.4 pg (ref 26.0–34.0)
MCHC: 32.5 g/dL (ref 30.0–36.0)
MCV: 93.7 fL (ref 78.0–100.0)
PLATELETS: 100 10*3/uL — AB (ref 150–400)
RBC: 4.11 MIL/uL — AB (ref 4.22–5.81)
RDW: 16.5 % — AB (ref 11.5–15.5)
WBC: 6 10*3/uL (ref 4.0–10.5)

## 2017-04-02 MED ORDER — POLYETHYLENE GLYCOL 3350 17 G PO PACK: 17.0000 g | PACK | Freq: Every day | ORAL | Status: DC | PRN

## 2017-04-02 MED ORDER — METOPROLOL SUCCINATE ER 25 MG PO TB24
25.0000 mg | ORAL_TABLET | Freq: Every day | ORAL | Status: DC
  Administered 2017-04-02 – 2017-04-04 (×3): 25 mg via ORAL
  Filled 2017-04-02 (×3): qty 1

## 2017-04-02 MED ORDER — MOMETASONE FURO-FORMOTEROL FUM 200-5 MCG/ACT IN AERO
2.0000 | INHALATION_SPRAY | Freq: Two times a day (BID) | RESPIRATORY_TRACT | Status: DC
  Administered 2017-04-03 – 2017-04-04 (×2): 2 via RESPIRATORY_TRACT
  Filled 2017-04-02: qty 8.8

## 2017-04-02 MED ORDER — DIGOXIN 125 MCG PO TABS
0.1250 mg | ORAL_TABLET | Freq: Every day | ORAL | Status: DC
  Administered 2017-04-02 – 2017-04-04 (×3): 0.125 mg via ORAL
  Filled 2017-04-02 (×3): qty 1

## 2017-04-02 MED ORDER — CALCIUM CARBONATE ANTACID 500 MG PO CHEW: 1.0000 | CHEWABLE_TABLET | Freq: Every day | ORAL | Status: DC | PRN

## 2017-04-02 MED ORDER — LOPERAMIDE HCL 2 MG PO CAPS
2.0000 mg | ORAL_CAPSULE | Freq: Four times a day (QID) | ORAL | Status: DC | PRN
  Administered 2017-04-02: 2 mg via ORAL
  Filled 2017-04-02: qty 1

## 2017-04-02 MED ORDER — CYCLOSPORINE 0.05 % OP EMUL
1.0000 [drp] | Freq: Two times a day (BID) | OPHTHALMIC | Status: DC
Start: 2017-04-02 — End: 2017-04-02

## 2017-04-02 MED ORDER — FAMOTIDINE 20 MG PO TABS
10.0000 mg | ORAL_TABLET | Freq: Every day | ORAL | Status: DC
  Administered 2017-04-02 – 2017-04-04 (×3): 10 mg via ORAL
  Filled 2017-04-02 (×3): qty 1

## 2017-04-02 MED ORDER — POTASSIUM CHLORIDE CRYS ER 20 MEQ PO TBCR
40.0000 meq | EXTENDED_RELEASE_TABLET | Freq: Once | ORAL | Status: AC
  Administered 2017-04-02: 40 meq via ORAL
  Filled 2017-04-02: qty 2

## 2017-04-02 MED ORDER — FLUTICASONE PROPIONATE 50 MCG/ACT NA SUSP: 1.0000 | Freq: Every day | NASAL | Status: DC | PRN

## 2017-04-02 NOTE — Care Management Obs Status (Signed)
[place] NOTIFICATION   Patient Details  Name: [name] MRN: [id number] Date of Birth: [birth year]   Medicare Observation Status Notification Given:  Yes    Carles Collet, RN 04/02/2017, 8:50 AM

## 2017-04-02 NOTE — Progress Notes (Addendum)
PROGRESS NOTE    [name]  JXB:[id number] DOB: 01-02-1933 DOA: 04/01/2017 PCP: [name], MD   Brief Narrative:84 y.o. male with medical history significant for afib not on anticoagulation due to prior subdural hemorrhage, diabetes, hypertension presents to emergency Department chief complaint unwitnessed fall. Initial evaluation reveals acute kidney injury dehydration. [facility] are asked to admit  Information is obtained from the patient and his neighbor who is at the bedside. Neighbor says he goes over to the house a couple days a week to "check on him". He went this morning and patient did not answer the door. The neighbor no solid patient on the kitchen floor. Patient was unable to call to door to unlock it but he was conscious and conversing with the neighbor. Neighbor found and then locked window and entered the house. EMS was called and patient was transported to the emergency department. Patient has no recollection of how he ended up on the floor and is not sure how long he was on the floor. He denies headache dizziness syncope or near-syncope. He denies chest pain palpitations shortness of breath. He denies nausea vomiting diarrhea. Neighbor indicates there was no indication that patient had been eating and the house was "dirty".  Of note patient was admitted several weeks ago for pulmonary embolus but was not started on blood thinners at that time due to high fall risk and previous subdural hemorrhage.  ED Course: In the emergency department he's afebrile hemodynamically stable with a blood pressure the high and of normal he is tachycardic    Assessment & Plan:   Principal Problem:   Acute kidney injury ([place]) Active Problems:   Chronic atrial fibrillation (HCC)   Chronic systolic heart failure- EF 35-40%   Anemia, iron deficiency   Dehydration   History of subdural hemorrhage   Thrombocytopenia (HCC)   ICD (implantable cardioverter-defibrillator) in  place   Chronic pulmonary embolism (HCC)   Syncope   Thyroid nodule  #1. Acute kidney injury. Likely related to dehydration secondary to decreased oral intake. Creatinine 1.88 upon admission now down to 1.36.. 2 days ago creatinine within the limits of normal. -Gentle IV fluids -Hold nephrotoxins -Monitor urine output -Recheck in the morning  #2. Fall/syncope. Patient had an unwitnessed fall but was conscious when found. His recollection of circumstances around fall unclear. Does not believe he syncopized or hit his head. He has had an unsteady gait. Initial troponin negative. EKG without acute changes. No signs of infection. No metabolic derangement. CT of the head without acute abnormality. He was evaluated by physical therapy during recent hospitalization who recommended snuff and he refused. He has no family members friends who can assist him. -Gentle IV fluids -Obtain orthostatics -Cycle troponin troponin 0 0.06 will follow the third level. -hold altering meds -Social worker to assist with placement  #3. Chronic systolic heart failure. EF 35%. Home medications include digoxin, metoprolol. Dig level below therapeutic -Continue dig, dig level 0.3 -Decrease the dose of metoprolol -Hold Lasix for now -Monitor intake and output -obtain daily weights  #4. Chronic atrial fibrillation. Automatic implantable cardioverter-defibrillator in situ. Not on anticoagulation due to history of subdural hemorrhage and high fall risk. Heart rate 112 the point of admission. EKG as noted above. -Continue home meds -monitor  #5. Chronic pulmonary embolism. Patient recently hospitalized fall. Her cup revealed right-sided PE thought to be chronic but could not rule out acute. CCM opines likely old. He has no respiratory symptoms no hypoxemia. Recent lower shin  many ultrasound duplex negative for DVT. Cardiology also consulted and opined patient poor candidate for anticoagulation -Monitor oxygen  saturation level  #6. Hypertension. Controlled the emergency department. Home medications include metoprolol and Lasix.  Lasix currently on hold decrease the dose of metoprolol his blood pressure is low to low normal. -Holding Lasix for now -Continue beta blocker  #7. Review indicates patient threatened to leave AMA last hospitalization. He was evaluated by psych opined not capable of making decisions for himself. At that point his son was contacted and gave consent for patient to be discharged home  8] hypokalemia replete and recheck.  9] elevated bilirubin question etiology looks like his bilirubin has gone up from 1.6-2.7 today.  We will follow-up levels tomorrow.      DVT prophylaxis: SCD Code Status full code Family Communication: No family available.  Disposition Plan: PT evaluation possible rehab placement.  None none Consultants:  None Procedures: None Antimicrobials: None  Subjective: Awake very hard of hearing.  Wanting to go home.  Objective: Vitals:   04/02/17 0013 04/02/17 0526 04/02/17 0722 04/02/17 0807  BP: 121/61 (!) 104/54  121/74  Pulse: 89 82  (!) 104  Resp:  18  18  Temp: 98.7 F (37.1 C) 98 F (36.7 C)  98.3 F (36.8 C)  TempSrc: Oral Oral  Oral  SpO2: 99% 97%  97%  Weight:   76.1 kg (167 lb 12.8 oz)   Height:        Intake/Output Summary (Last 24 hours) at 04/02/2017 1125 Last data filed at 04/01/2017 2300 Gross per 24 hour  Intake 1275.83 ml  Output 100 ml  Net 1175.83 ml   Filed Weights   04/01/17 1640 04/02/17 0722  Weight: 75.9 kg (167 lb 5.3 oz) 76.1 kg (167 lb 12.8 oz)    Examination:  General exam: Appears calm and comfortable  Respiratory system: Clear to auscultation. Respiratory effort normal. Cardiovascular system: S1 & S2 heard, RRR. No JVD, murmurs, rubs, gallops or clicks. No pedal edema. Gastrointestinal system: Abdomen is nondistended, soft and nontender. No organomegaly or masses felt. Normal bowel sounds  heard. Central nervous system: Alert and oriented. No focal neurological deficits. Extremities: Symmetric 5 x 5 power. Skin: No rashes, lesions or ulcers Psychiatry: Judgement and insight appear normal. Mood & affect appropriate.     Data Reviewed: I have personally reviewed following labs and imaging studies  CBC: Recent Labs  Lab 03/27/17 1445 04/01/17 1424 04/02/17 0656  WBC 7.6 9.1 6.0  NEUTROABS  --  5.1  --   HGB 14.4 14.1 12.5*  HCT 43.4 42.7 38.5*  MCV 91.9 92.6 93.7  PLT 165 113* 774*   Basic Metabolic Panel: Recent Labs  Lab 03/26/17 1858 03/27/17 1445 03/29/17 0505 04/01/17 1209 04/02/17 0656  NA 139  --  140 138 140  K 3.8  --  3.5 3.6 3.2*  CL 99*  --  99* 101 106  CO2 24  --  30 22 23   GLUCOSE 101*  --  125* 133* 95  BUN 24*  --  16 31* 24*  CREATININE 1.32* 1.19 1.15 1.88* 1.36*  CALCIUM 9.5  --  9.4 9.0 8.6*  MG  --   --  1.6*  --   --    GFR: Estimated Creatinine Clearance: 45.1 mL/min (A) (by C-G formula based on SCr of 1.36 mg/dL (H)). Liver Function Tests: Recent Labs  Lab 03/26/17 1858 04/01/17 1209  AST 23 31  ALT 10* 11*  ALKPHOS  62 55  BILITOT 1.7* 2.6*  PROT 7.0 6.5  ALBUMIN 4.2 3.7   Recent Labs  Lab 03/26/17 1858  LIPASE 28   No results for input(s): AMMONIA in the last 168 hours. Coagulation Profile: Recent Labs  Lab 03/26/17 1720  INR 1.48   Cardiac Enzymes: Recent Labs  Lab 04/01/17 1209 04/01/17 2101  CKTOTAL 262  --   TROPONINI  --  0.06*   BNP (last 3 results) No results for input(s): PROBNP in the last 8760 hours. HbA1C: No results for input(s): HGBA1C in the last 72 hours. CBG: No results for input(s): GLUCAP in the last 168 hours. Lipid Profile: No results for input(s): CHOL, HDL, LDLCALC, TRIG, CHOLHDL, LDLDIRECT in the last 72 hours. Thyroid Function Tests: No results for input(s): TSH, T4TOTAL, FREET4, T3FREE, THYROIDAB in the last 72 hours. Anemia Panel: No results for input(s): VITAMINB12,  FOLATE, FERRITIN, TIBC, IRON, RETICCTPCT in the last 72 hours. Sepsis Labs: Recent Labs  Lab 03/26/17 1736 03/26/17 1909  LATICACIDVEN 1.18 1.09    Recent Results (from the past 240 hour(s))  Urine culture     Status: None   Collection Time: 03/26/17  5:20 PM  Result Value Ref Range Status   Specimen Description   Final    URINE, CLEAN CATCH Performed at [facility], [place] [address], [place], [place] [address]    Special Requests   Final    NONE Performed at [facility], [place] [address] [place], [place] [address]    Culture   Final    NO GROWTH Performed at [facility], [place] [address] [place], [place] [address]    Report Status 03/28/2017 FINAL  Final         Radiology Studies: Dg Chest 2 View  Result Date: 04/01/2017 CLINICAL DATA:  Pain after fall EXAM: CHEST  2 VIEW COMPARISON:  March 25, 2017 FINDINGS: Postsurgical changes in the chest with sternotomy wires. Mild cardiomegaly. The hila and mediastinum are normal. No pneumothorax. No nodules or masses. No focal infiltrates. IMPRESSION: No active cardiopulmonary disease. Electronically Signed   By: Dorise Bullion III M.D   On: 04/01/2017 12:42   Dg Pelvis 1-2 Views  Result Date: 04/01/2017 CLINICAL DATA:  Fall.  Pain. EXAM: PELVIS - 1-2 VIEW COMPARISON:  None. FINDINGS: Degenerative changes in the hips, right greater than left. No acute fractures are seen. IMPRESSION: Degenerative changes in the hips, right greater than left. Electronically Signed   By: Dorise Bullion III M.D   On: 04/01/2017 12:43   Ct Head Wo Contrast  Result Date: 04/01/2017 CLINICAL DATA:  Found down by a friend who last saw the patient yesterday. EXAM: CT HEAD WITHOUT CONTRAST CT CERVICAL SPINE WITHOUT CONTRAST TECHNIQUE: Multidetector CT imaging of the head and cervical spine was performed following the standard protocol without intravenous contrast. Multiplanar CT image reconstructions of the  cervical spine were also generated. COMPARISON:  Head CT - 03/26/2017; 03/24/2015; 09/10/2012 FINDINGS: CT HEAD FINDINGS Brain: Similar findings atrophy with sulcal prominence. Scattered periventricular hypodensities compatible microvascular ischemic disease, unchanged. Given background parenchymal abnormalities, there is no CT evidence of superimposed acute large territory infarct. No intraparenchymal or extra-axial mass or hemorrhage. Unchanged size and configuration of the ventricles and the basilar cisterns. No midline shift. Vascular: Intracranial atherosclerosis. Skull: No displaced calvarial fracture. Sinuses/Orbits: Limited visualization of the paranasal sinuses and mastoid air cells is normal. No air-fluid levels. Other: Regional soft tissues appear normal. CT CERVICAL SPINE FINDINGS  Alignment: C1 to the superior endplate of T2 is imaged. There is straightening expected cervical lordosis. No anterolisthesis or retrolisthesis. The dens is normally positioned between the lateral masses of C1. Mild-to-moderate degenerative change of the atlantodental articulation. Normal atlantoaxial articulations. Skull base and vertebrae: Cervical vertebral body heights are preserved. Prevertebral soft tissues are normal. Soft tissues and spinal canal: Regional soft tissues appear normal. No bulky cervical lymphadenopathy on this noncontrast examination. There is partial ossification the nuchal ligament. Disc levels: There is multi level flowing osteophytosis and ankylosis throughout the cervical spine. Prominent posteriorly directed disc osteophyte complexes are seen most conspicuously at C3-C4 (resulting in at least moderate spinal stenosis at this location-axial image 39, series 8; sagittal image 28, series 4), and to a lesser extent, C5-C6 and C4-C5. Upper chest: Limited visualization the lung apices demonstrates minimal biapical pleuroparenchymal thickening. Other: Note is made of a large (approximately 3.8 x 2.8 cm  nodule/mass nearly replacing the left lobe of the thyroid which is noted to contain several internal calcifications. Heterogeneous appearance of the imaged portions of the right lobe of the thyroid. IMPRESSION: 1. Similar findings of atrophy and microvascular ischemic disease without superimposed acute intracranial process. 2. No fracture or static subluxation of the cervical spine. 3. Multi level flowing osteophytosis and ankylosis throughout the cervical spine compatible with DISH. 4. Prominent posteriorly directed disc osteophyte complexes are seen throughout the cervical spine, worse at C3-C4, resulting at least moderate spinal stenosis at this location. Further evaluation with dedicated nonemergent cervical spine MRI performed as indicated. 5. Incidentally noted approximately 3.8 cm nodule/mass nearly replacing the left lobe of the thyroid. Further evaluation with dedicated nonemergent thyroid ultrasound could be performed as clinically indicated. Electronically Signed   By: Sandi Mariscal M.D.   On: 04/01/2017 13:21   Ct Cervical Spine Wo Contrast  Result Date: 04/01/2017 CLINICAL DATA:  Found down by a friend who last saw the patient yesterday. EXAM: CT HEAD WITHOUT CONTRAST CT CERVICAL SPINE WITHOUT CONTRAST TECHNIQUE: Multidetector CT imaging of the head and cervical spine was performed following the standard protocol without intravenous contrast. Multiplanar CT image reconstructions of the cervical spine were also generated. COMPARISON:  Head CT - 03/26/2017; 03/24/2015; 09/10/2012 FINDINGS: CT HEAD FINDINGS Brain: Similar findings atrophy with sulcal prominence. Scattered periventricular hypodensities compatible microvascular ischemic disease, unchanged. Given background parenchymal abnormalities, there is no CT evidence of superimposed acute large territory infarct. No intraparenchymal or extra-axial mass or hemorrhage. Unchanged size and configuration of the ventricles and the basilar cisterns. No  midline shift. Vascular: Intracranial atherosclerosis. Skull: No displaced calvarial fracture. Sinuses/Orbits: Limited visualization of the paranasal sinuses and mastoid air cells is normal. No air-fluid levels. Other: Regional soft tissues appear normal. CT CERVICAL SPINE FINDINGS Alignment: C1 to the superior endplate of T2 is imaged. There is straightening expected cervical lordosis. No anterolisthesis or retrolisthesis. The dens is normally positioned between the lateral masses of C1. Mild-to-moderate degenerative change of the atlantodental articulation. Normal atlantoaxial articulations. Skull base and vertebrae: Cervical vertebral body heights are preserved. Prevertebral soft tissues are normal. Soft tissues and spinal canal: Regional soft tissues appear normal. No bulky cervical lymphadenopathy on this noncontrast examination. There is partial ossification the nuchal ligament. Disc levels: There is multi level flowing osteophytosis and ankylosis throughout the cervical spine. Prominent posteriorly directed disc osteophyte complexes are seen most conspicuously at C3-C4 (resulting in at least moderate spinal stenosis at this location-axial image 39, series 8; sagittal image 28, series 4), and to a lesser extent,  C5-C6 and C4-C5. Upper chest: Limited visualization the lung apices demonstrates minimal biapical pleuroparenchymal thickening. Other: Note is made of a large (approximately 3.8 x 2.8 cm nodule/mass nearly replacing the left lobe of the thyroid which is noted to contain several internal calcifications. Heterogeneous appearance of the imaged portions of the right lobe of the thyroid. IMPRESSION: 1. Similar findings of atrophy and microvascular ischemic disease without superimposed acute intracranial process. 2. No fracture or static subluxation of the cervical spine. 3. Multi level flowing osteophytosis and ankylosis throughout the cervical spine compatible with DISH. 4. Prominent posteriorly directed  disc osteophyte complexes are seen throughout the cervical spine, worse at C3-C4, resulting at least moderate spinal stenosis at this location. Further evaluation with dedicated nonemergent cervical spine MRI performed as indicated. 5. Incidentally noted approximately 3.8 cm nodule/mass nearly replacing the left lobe of the thyroid. Further evaluation with dedicated nonemergent thyroid ultrasound could be performed as clinically indicated. Electronically Signed   By: Sandi Mariscal M.D.   On: 04/01/2017 13:21        Scheduled Meds: Continuous Infusions: . sodium chloride 50 mL/hr at 04/01/17 1730     LOS: 0 days      Georgette Shell, MD [facility]  If 7PM-7AM, please contact night-coverage [email or web address] Password [facility] 04/02/2017, 11:25 AM

## 2017-04-02 NOTE — Care Management Note (Signed)
Case Management Note  Patient Details  Name: KVION SHAPLEY MRN: [id number] Date of Birth: Jul 13, 1932  Subjective/Objective:              Patient DC'd from the hospital 5 days ago to home w [facility] for [facility] services. Patient lives at home alone, is checked on by neighbors and has frequent falls. Patient with 5 admissions in the past 6 months with 1 stay at [facility] during this time. Patient not eligible for ST SNF at this time due to OBS and not appropriate for Inpatient status at this time. Patient would be a good candidate for Home First with [facility].    Action/Plan:   Expected Discharge Date:                  Expected Discharge Plan:  [place]  In-House Referral:     Discharge planning Services  CM Consult  Post Acute Care Choice:    Choice offered to:     DME Arranged:    DME Agency:     HH Arranged:    [place] Agency:     Status of Service:  In process, will continue to follow  If discussed at Long Length of Stay Meetings, dates discussed:    Additional Comments:  Carles Collet, RN 04/02/2017, 12:02 PM

## 2017-04-02 NOTE — Evaluation (Signed)
Physical Therapy Evaluation Patient Details Name: [name] MRN: [id number] DOB: [birth year] Today's Date: 04/02/2017   History of Present Illness  [name] is a 84 y.o. male with a Past Medical History significant for afib, DM, htn who presents with fall. Found down by neighbor  Clinical Impression   Pt admitted with above diagnosis. Pt currently with functional limitations due to the deficits listed below (see PT Problem List). Presents with decr independence and safety with mobility and ADLs;  Pt will benefit from skilled PT to increase their independence and safety with mobility to allow discharge to the venue listed below.       Follow Up Recommendations SNF;Supervision/Assistance - 24 hour; If unable to go back to SNF, I agree with Home First program with [facility]; It is also time to start discussions about more sustainable long-term living options/ looking into ALFs    Equipment Recommendations  Rolling walker with 5" wheels;3in1 (PT)    Recommendations for Other Services       Precautions / Restrictions Precautions Precautions: Fall Restrictions Weight Bearing Restrictions: No      Mobility  Bed Mobility Overal bed mobility: Needs Assistance Bed Mobility: Supine to Sit     Supine to sit: Min assist     General bed mobility comments: Min assist to elevate trunk to sit  Transfers Overall transfer level: Needs assistance Equipment used: Rolling walker (2 wheeled) Transfers: Sit to/from Stand Sit to Stand: Min assist         General transfer comment: Min A for lift and to steady, min guard during ambulation and transfers as well as consistent vc's for safety and RW hand placement throuhgout session.  Ambulation/Gait Ambulation/Gait assistance: Min guard;Min assist Ambulation Distance (Feet): (pivotal steps bed to recliner to [facility]) Assistive device: Rolling walker (2 wheeled) Gait Pattern/deviations: Shuffle     General Gait Details: Cues to  self-monitor for activity tolerance; min assist for RW management  Stairs            Wheelchair Mobility    Modified Rankin (Stroke Patients Only)       Balance             Standing balance-Leahy Scale: Poor                               Pertinent Vitals/Pain Pain Assessment: No/denies pain Faces Pain Scale: No hurt    Home Living Family/patient expects to be discharged to:: Private residence Living Arrangements: Alone Available Help at Discharge: Neighbor;Other (Comment)(checks in a few times a week) Type of Home: House Home Access: Level entry     Home Layout: One level Home Equipment: Walker - 2 wheels;Walker - 4 wheels;Cane - single point;Grab bars - toilet;Grab bars - tub/shower Additional Comments: Pt confused, so will need to ensure accuracy of information with family.     Prior Function Level of Independence: Independent with assistive device(s)               Hand Dominance   Dominant Hand: Right    Extremity/Trunk Assessment   Upper Extremity Assessment Upper Extremity Assessment: Generalized weakness    Lower Extremity Assessment Lower Extremity Assessment: Generalized weakness    Cervical / Trunk Assessment Cervical / Trunk Assessment: Kyphotic  Communication   Communication: No difficulties  Cognition Arousal/Alertness: Awake/alert Behavior During Therapy: WFL for tasks assessed/performed Overall Cognitive Status: No family/caregiver present to determine baseline cognitive functioning  Orientation Level: Disoriented to;Time("1989")                    General Comments General comments (skin integrity, edema, etc.):   04/02/17 0952 04/02/17 1000  Orthostatic Lying   BP- Lying 121/69 --   Pulse- Lying 85 --   Orthostatic Sitting  BP- Sitting 135/79 154/87  Pulse- Sitting 116 107  Orthostatic Standing at 0 minutes  BP- Standing at 0 minutes 130/67 --   Pulse- Standing at 0  minutes 110 --        Exercises     Assessment/Plan    PT Assessment Patient needs continued PT services  PT Problem List Decreased strength;Decreased range of motion;Decreased activity tolerance;Decreased balance;Decreased mobility;Decreased coordination;Decreased cognition;Decreased knowledge of use of DME;Decreased safety awareness;Decreased knowledge of precautions       PT Treatment Interventions DME instruction;Gait training;Stair training;Functional mobility training;Therapeutic activities;Therapeutic exercise;Balance training;Neuromuscular re-education;Cognitive remediation;Patient/family education    PT Goals (Current goals can be found in the Care Plan section)  Acute Rehab PT Goals Patient Stated Goal: Did not state PT Goal Formulation: With patient Time For Goal Achievement: 04/16/17 Potential to Achieve Goals: Good    Frequency Min 3X/week   Barriers to discharge Decreased caregiver support      Co-evaluation               AM-PAC PT "6 Clicks" Daily Activity  Outcome Measure Difficulty turning over in bed (including adjusting bedclothes, sheets and blankets)?: A Little Difficulty moving from lying on back to sitting on the side of the bed? : A Lot Difficulty sitting down on and standing up from a chair with arms (e.g., wheelchair, bedside commode, etc,.)?: A Lot Help needed moving to and from a bed to chair (including a wheelchair)?: A Little Help needed walking in hospital room?: A Little Help needed climbing 3-5 steps with a railing? : A Lot 6 Click Score: 15    End of Session Equipment Utilized During Treatment: Gait belt Activity Tolerance: Patient tolerated treatment well Patient left: with call bell/phone within reach;Other (comment)(On BSC) Nurse Communication: Mobility status PT Visit Diagnosis: Unsteadiness on feet (R26.81);Muscle weakness (generalized) (M62.81)    Time: 0071-2197 PT Time Calculation (min) (ACUTE ONLY): 19  min   Charges:   PT Evaluation $PT Eval Moderate Complexity: 1 Mod     PT G Codes:        Roney Marion, PT  Acute Rehabilitation Services Pager [phone] Office [phone]   [place] 04/02/2017, 3:24 PM

## 2017-04-03 ENCOUNTER — Encounter: Payer: Self-pay | Admitting: *Deleted

## 2017-04-03 ENCOUNTER — Encounter (HOSPITAL_COMMUNITY): Payer: Self-pay

## 2017-04-03 ENCOUNTER — Other Ambulatory Visit: Payer: Self-pay

## 2017-04-03 LAB — HEPATIC FUNCTION PANEL
ALBUMIN: 3.6 g/dL (ref 3.5–5.0)
ALT: 11 U/L — AB (ref 17–63)
AST: 35 U/L (ref 15–41)
Alkaline Phosphatase: 55 U/L (ref 38–126)
BILIRUBIN DIRECT: 0.7 mg/dL — AB (ref 0.1–0.5)
Indirect Bilirubin: 1.9 mg/dL — ABNORMAL HIGH (ref 0.3–0.9)
Total Bilirubin: 2.6 mg/dL — ABNORMAL HIGH (ref 0.3–1.2)
Total Protein: 6.4 g/dL — ABNORMAL LOW (ref 6.5–8.1)

## 2017-04-03 LAB — COMPREHENSIVE METABOLIC PANEL
ALBUMIN: 3.4 g/dL — AB (ref 3.5–5.0)
ALT: 13 U/L — ABNORMAL LOW (ref 17–63)
ANION GAP: 12 (ref 5–15)
AST: 33 U/L (ref 15–41)
Alkaline Phosphatase: 49 U/L (ref 38–126)
BILIRUBIN TOTAL: 2.7 mg/dL — AB (ref 0.3–1.2)
BUN: 20 mg/dL (ref 6–20)
CO2: 23 mmol/L (ref 22–32)
Calcium: 8.9 mg/dL (ref 8.9–10.3)
Chloride: 104 mmol/L (ref 101–111)
Creatinine, Ser: 1.13 mg/dL (ref 0.61–1.24)
GFR calc non Af Amer: 59 mL/min — ABNORMAL LOW (ref >60)
GLUCOSE: 115 mg/dL — AB (ref 65–99)
POTASSIUM: 3.8 mmol/L (ref 3.5–5.1)
SODIUM: 139 mmol/L (ref 135–145)
TOTAL PROTEIN: 6.3 g/dL — AB (ref 6.5–8.1)

## 2017-04-03 MED ORDER — LISINOPRIL 10 MG PO TABS: 5.0000 mg | ORAL_TABLET | Freq: Every day | ORAL | 0 refills | Status: DC

## 2017-04-03 MED ORDER — ACETAMINOPHEN 325 MG PO TABS: 650.0000 mg | ORAL_TABLET | Freq: Four times a day (QID) | ORAL | Status: DC | PRN

## 2017-04-03 MED ORDER — FUROSEMIDE 40 MG PO TABS: 20.0000 mg | ORAL_TABLET | Freq: Every day | ORAL | 3 refills | Status: DC

## 2017-04-03 NOTE — Progress Notes (Addendum)
Physical Therapy Treatment Patient Details Name: [name] MRN: [id number] DOB: 20-Mar-1932 Today's Date: 04/03/2017    History of Present Illness [name] is a 84 y.o. male with a Past Medical History significant for afib, DM, htn who presents with fall. Found down by neighbor    PT Comments    Pt progressing towards goals. Ambulated within the room this session and required min to min guard A with RW. One instance of R knee buckling requiring min to mod A for steadying. Pt reports this as the source of falls at home. Initiated seated/standing HEP. Feel current recommendations appropriate as pt a high fall risk. Will continue to follow acutely to maximize functional mobility independence and safety.   Follow Up Recommendations  SNF;Supervision/Assistance - 24 hour     Equipment Recommendations  Rolling walker with 5" wheels;3in1 (PT)    Recommendations for Other Services OT consult     Precautions / Restrictions Precautions Precautions: Fall Precaution Comments: Hx of falls at home  Restrictions Weight Bearing Restrictions: No LUE Weight Bearing: Weight bearing as tolerated    Mobility  Bed Mobility               General bed mobility comments: In chair upon entry   Transfers Overall transfer level: Needs assistance Equipment used: Rolling walker (2 wheeled) Transfers: Sit to/from Stand Sit to Stand: Min assist         General transfer comment: Min A for lift assist and steadying assist. Demonstrated safe hand placement.   Ambulation/Gait Ambulation/Gait assistance: Min guard;Min assist Ambulation Distance (Feet): 10 Feet Assistive device: Rolling walker (2 wheeled) Gait Pattern/deviations: Step-through pattern;Decreased stride length;Shuffle Gait velocity: Decreased Gait velocity interpretation: Below normal speed for age/gender General Gait Details: Slow, unsteady gait. Min guard to min A. One instance of R knee buckling requiring min-mod A  for steadying. Further mobility deferred. Cues for sequencing with RW. Educated about increasing safety with use of RW instead of rollator, however, pt reports he does not agree.    Stairs            Wheelchair Mobility    Modified Rankin (Stroke Patients Only)       Balance Overall balance assessment: Needs assistance Sitting-balance support: No upper extremity supported;Feet supported Sitting balance-Leahy Scale: Good     Standing balance support: Bilateral upper extremity supported;During functional activity Standing balance-Leahy Scale: Poor Standing balance comment: Reliant on RW for BUE suport                             Cognition Arousal/Alertness: Awake/alert Behavior During Therapy: WFL for tasks assessed/performed Overall Cognitive Status: No family/caregiver present to determine baseline cognitive functioning                   Orientation Level: Disoriented to;Place;Situation   Memory: Decreased short-term memory Following Commands: Follows one step commands with increased time Safety/Judgement: Decreased awareness of safety;Decreased awareness of deficits     General Comments: Was not sure where he was, but knew he was in [name].       Exercises General Exercises - Lower Extremity Long Arc Quad: AROM;Both;10 reps;Seated Hip ABduction/ADduction: AROM;Both;10 reps;Seated(with PT resistance ) Hip Flexion/Marching: AROM;Both;10 reps;Standing(with RW ) Toe Raises: AROM;Both;10 reps;Seated Heel Raises: AROM;Both;10 reps;Seated    General Comments        Pertinent Vitals/Pain Pain Assessment: No/denies pain    Home Living Family/patient expects to be discharged to:: Private  residence Living Arrangements: Alone                  Prior Function            PT Goals (current goals can now be found in the care plan section) Acute Rehab PT Goals Patient Stated Goal: To go home  PT Goal Formulation: With patient Time For  Goal Achievement: 04/16/17 Potential to Achieve Goals: Good Progress towards PT goals: Progressing toward goals    Frequency    Min 3X/week      PT Plan Current plan remains appropriate    Co-evaluation              AM-PAC PT "6 Clicks" Daily Activity  Outcome Measure  Difficulty turning over in bed (including adjusting bedclothes, sheets and blankets)?: A Little Difficulty moving from lying on back to sitting on the side of the bed? : Unable Difficulty sitting down on and standing up from a chair with arms (e.g., wheelchair, bedside commode, etc,.)?: Unable Help needed moving to and from a bed to chair (including a wheelchair)?: A Little Help needed walking in hospital room?: A Little Help needed climbing 3-5 steps with a railing? : A Lot 6 Click Score: 13    End of Session Equipment Utilized During Treatment: Gait belt Activity Tolerance: Patient tolerated treatment well Patient left: in chair;with call bell/phone within reach;with chair alarm set Nurse Communication: Mobility status PT Visit Diagnosis: Unsteadiness on feet (R26.81);Muscle weakness (generalized) (M62.81);History of falling (Z91.81)     Time: 3734-2876 PT Time Calculation (min) (ACUTE ONLY): 18 min  Charges:  $Therapeutic Activity: 8-22 mins                    G Codes:       Leighton Ruff, PT, DPT  Acute Rehabilitation Services  Pager: [phone]    Rudean Hitt 04/03/2017, 12:37 PM

## 2017-04-03 NOTE — Progress Notes (Signed)
RN placed floor mats on both sides of pt's bed. Bed alarm is on. Pt refuses to lay in bed. Pt is sitting in bed.

## 2017-04-03 NOTE — Progress Notes (Signed)
Pt called for nurse to take him to the bathroom. Pt did not try to get up by himself.

## 2017-04-03 NOTE — Plan of Care (Signed)
  Progressing Education: Knowledge of General Education information will improve 04/03/2017 1027 - Progressing by Ottie Glazier, RN Health Behavior/Discharge Planning: Ability to manage health-related needs will improve 04/03/2017 1027 - Progressing by Ottie Glazier, RN Clinical Measurements: Respiratory complications will improve 04/03/2017 1027 - Progressing by Ottie Glazier, RN Skin Integrity: Risk for impaired skin integrity will decrease 04/03/2017 1027 - Progressing by Ottie Glazier, RN

## 2017-04-03 NOTE — Progress Notes (Signed)
Received message from Annye Rusk, pt does not want SNF at this time; see full CM consult note from 04/02/2017 from D Swist RN CM; patient is active with [facility] as prior to admission, they cannot accept the patient back at this time; Referral made over the weekend to [facility]; Cory with [facility] will see the patient in the am. Aneta Mins [phone]

## 2017-04-03 NOTE — Discharge Summary (Addendum)
Physician Discharge Summary  [name]:[id number] DOB: 05/27/1932 DOA: 04/01/2017  PCP: [name], MD  Admit date: 04/01/2017 Discharge date: 04/03/2017  Admitted From:home Disposition:  Nursing home Recommendations for Outpatient Follow-up:  1. Follow up with PCP in 1-2 weeks 2. Please obtain BMP/CBC in one week  Home Health none Equipment/Devices:none Discharge Conditionstable CODE STATUS full  Diet recommendation cardiac Brief/Interim Summary: 84 y.o.malewith medical history significantfor afibnot on anticoagulation due to prior subdural hemorrhage, diabetes, hypertension presents to emergency Department chief complaint unwitnessed fall. Initial evaluation reveals acute kidney injury dehydration. [facility] hospitalists are asked to admit  Information is obtained from the patient and his neighbor who is at the bedside. Neighbor says he goes over to the house a couple days a week to "check on him". He went this morning and patient did not answer the door. The neighbor no solid patient on the kitchen floor. Patient was unable to call to door to unlock it but he was conscious and conversing with the neighbor. Neighbor found and then locked window and entered the house. EMS was called and patient was transported to the emergency department. Patient has no recollection of how he ended up on the floor and is not sure how long he was on the floor. He denies headache dizziness syncope or near-syncope. He denies chest pain palpitations shortness of breath. He denies nausea vomiting diarrhea. Neighbor indicates there was no indication that patient had been eatingand the house was "dirty".  Of note patient was admitted several weeks ago for pulmonary embolus but was not started on blood thinners at that time due to high fall risk and previous subdural hemorrhage.    Discharge Diagnoses:  Principal Problem:   Acute kidney injury ([place]) Active Problems:   Chronic atrial fibrillation  (HCC)   Chronic systolic heart failure- EF 35-40%   Anemia, iron deficiency   Dehydration   History of subdural hemorrhage   Thrombocytopenia (HCC)   ICD (implantable cardioverter-defibrillator) in place   Chronic pulmonary embolism (HCC)   Syncope   Thyroid nodule  1]#1. Acute kidney injury. Likely related to dehydration secondary to decreased oral intake. Creatinine 1.88upon admission now down to 1.36..2 days ago creatinine within the limits of normal.  #2. Fall/syncope. Patient had an unwitnessed fall but was conscious when found. His recollection of circumstances around fall unclear.Does not believe he syncopized or hit his head. He has had an unsteady gait.Initial troponin negative. EKG without acute changes. No signs of infection. No metabolic derangement. CT of the head without acute abnormality. He was evaluated by physical therapy during recent hospitalization who recommended snuff and he refused.He has no family members friends who can assist him.  Patient had 5 admissions in the last 6 months.  Last time he was here he was told to go to skilled nursing facility but he refused.  Again this time he refuses to go to rehab or a skilled nursing facility.  He is adamant about going home.  #3. Chronic systolic heart failure. EF 35%. Home medications include digoxin, metoprolol. Dig level below therapeutic.  Restart Lasix at a lower dose.  Decrease the dose of lisinopril.  Restart metoprolol.  #4. Chronic atrial fibrillation.Automatic implantable cardioverter-defibrillator in situ.Not on anticoagulation due to history of subdural hemorrhage and high fall risk  5] chronic PE not a candidate for anticoagulation due to recurrent falls.  6] hypertension blood pressure soft.  Restarted Lasix and lisinopril at a lower dose.  7] isolated elevation in indirect bilirubin  1.9 patient follow-up with [name] as an outpatient.  Patient was asymptomatic.  Discussed with GI on  call.     Discharge Instructions  Discharge Instructions    Call MD for:  difficulty breathing, headache or visual disturbances   Complete by:  As directed    Call MD for:  persistant dizziness or light-headedness   Complete by:  As directed    Call MD for:  persistant nausea and vomiting   Complete by:  As directed    Call MD for:  severe uncontrolled pain   Complete by:  As directed    Call MD for:  temperature >100.4   Complete by:  As directed    Diet - low sodium heart healthy   Complete by:  As directed    Increase activity slowly   Complete by:  As directed      Allergies as of 04/03/2017      Reactions   Tamsulosin Other (See Comments)   Dizziness, Made BP very low and weakness   Celebrex [celecoxib] Hives, Nausea And Vomiting   Gi upset   Dronedarone Nausea And Vomiting, Other (See Comments)   GI upset, abdominal pain   Esomeprazole Magnesium Hives, Other (See Comments)   "don't really remember"   Digoxin Diarrhea   May have caused some diarrhea   Hydrocodone-acetaminophen Other (See Comments)   Bad headache   Protonix [pantoprazole Sodium] Nausea And Vomiting, Other (See Comments)   Tolerates Dexilant      Medication List    STOP taking these medications   metoprolol succinate 25 MG 24 hr tablet Commonly known as:  TOPROL-XL   polyethylene glycol packet Commonly known as:  MIRALAX / GLYCOLAX   promethazine 25 MG tablet Commonly known as:  PHENERGAN   ranitidine 150 MG tablet Commonly known as:  ZANTAC     TAKE these medications   acetaminophen 325 MG tablet Commonly known as:  TYLENOL Take 2 tablets (650 mg total) by mouth every 6 (six) hours as needed for mild pain (or Fever >/= 101).   ADVAIR DISKUS 250-50 MCG/DOSE Aepb Generic drug:  Fluticasone-Salmeterol Inhale 1 puff into the lungs 2 (two) times daily.   calcium carbonate 500 MG chewable tablet Commonly known as:  TUMS - dosed in mg elemental calcium Chew 500 mg by mouth daily as  needed for heartburn.   digoxin 0.125 MG tablet Commonly known as:  LANOXIN Take 1 tablet (0.125 mg total) by mouth daily.   fluticasone 50 MCG/ACT nasal spray Commonly known as:  FLONASE Place 2 sprays into both nostrils daily. What changed:    how much to take  when to take this  reasons to take this   furosemide 40 MG tablet Commonly known as:  LASIX Take 0.5 tablets (20 mg total) by mouth daily. May take an extra tab daily as needed for swelling What changed:  how much to take   gabapentin 100 MG capsule Commonly known as:  NEURONTIN TAKE 1 CAPSULE (100MG ) TO 3 CAPSULES (300MG ) EACH NIGHT AS NEEDED FOR MUSCLE PAINS What changed:    how much to take  how to take this  when to take this  reasons to take this  additional instructions   lisinopril 10 MG tablet Commonly known as:  PRINIVIL,ZESTRIL Take 0.5 tablets (5 mg total) by mouth daily. What changed:  how much to take   loratadine 10 MG tablet Commonly known as:  CLARITIN Take 10 mg by mouth daily as needed  for allergies.   nitroGLYCERIN 0.4 MG SL tablet Commonly known as:  NITROSTAT PLACE ONE UNDER TONGUE FOR CHEST PAIN. What changed:  See the new instructions.   potassium chloride 10 MEQ tablet Commonly known as:  K-DUR,KLOR-CON Take 1 tablet (10 mEq total) by mouth daily.   RESTASIS 0.05 % ophthalmic emulsion Generic drug:  cycloSPORINE INSTILL 1 DROP INTO BOTH EYES TWICE A DAY   traMADol 50 MG tablet Commonly known as:  ULTRAM TAKE 2 TABLETS BY MOUTH EVERY 8 HOURS AS NEEDED FOR PAIN What changed:    how much to take  how to take this  when to take this  reasons to take this  additional instructions       Allergies  Allergen Reactions  . Tamsulosin Other (See Comments)    Dizziness, Made BP very low and weakness  . Celebrex [Celecoxib] Hives and Nausea And Vomiting    Gi upset  . Dronedarone Nausea And Vomiting and Other (See Comments)    GI upset, abdominal pain  .  Esomeprazole Magnesium Hives and Other (See Comments)    "don't really remember"  . Digoxin Diarrhea    May have caused some diarrhea  . Hydrocodone-Acetaminophen Other (See Comments)    Bad headache  . Protonix [Pantoprazole Sodium] Nausea And Vomiting and Other (See Comments)    Tolerates Dexilant    Consultations:   Procedures/Studies: Dg Chest 2 View  Result Date: 04/01/2017 CLINICAL DATA:  Pain after fall EXAM: CHEST  2 VIEW COMPARISON:  March 25, 2017 FINDINGS: Postsurgical changes in the chest with sternotomy wires. Mild cardiomegaly. The hila and mediastinum are normal. No pneumothorax. No nodules or masses. No focal infiltrates. IMPRESSION: No active cardiopulmonary disease. Electronically Signed   By: Dorise Bullion III M.D   On: 04/01/2017 12:42   Dg Chest 2 View  Result Date: 03/25/2017 CLINICAL DATA:  Fall with left-sided pain EXAM: CHEST  2 VIEW COMPARISON:  03/23/2017, 03/03/2017, 12/23/2016 FINDINGS: Post sternotomy changes. Left-sided pacing device as before. Hyperinflation with tiny pleural effusion or pleural scarring. Multiple epicardial leads. No consolidation. No pneumothorax. Stable cardiomediastinal silhouette with aortic atherosclerosis. Degenerative changes of the spine IMPRESSION: No active cardiopulmonary disease. Stable tiny pleural effusions or pleural thickening. Hyperinflation. Electronically Signed   By: Donavan Foil M.D.   On: 03/25/2017 02:43   Dg Pelvis 1-2 Views  Result Date: 04/01/2017 CLINICAL DATA:  Fall.  Pain. EXAM: PELVIS - 1-2 VIEW COMPARISON:  None. FINDINGS: Degenerative changes in the hips, right greater than left. No acute fractures are seen. IMPRESSION: Degenerative changes in the hips, right greater than left. Electronically Signed   By: Dorise Bullion III M.D   On: 04/01/2017 12:43   Ct Head Wo Contrast  Result Date: 04/01/2017 CLINICAL DATA:  Found down by a friend who last saw the patient yesterday. EXAM: CT HEAD WITHOUT CONTRAST CT  CERVICAL SPINE WITHOUT CONTRAST TECHNIQUE: Multidetector CT imaging of the head and cervical spine was performed following the standard protocol without intravenous contrast. Multiplanar CT image reconstructions of the cervical spine were also generated. COMPARISON:  Head CT - 03/26/2017; 03/24/2015; 09/10/2012 FINDINGS: CT HEAD FINDINGS Brain: Similar findings atrophy with sulcal prominence. Scattered periventricular hypodensities compatible microvascular ischemic disease, unchanged. Given background parenchymal abnormalities, there is no CT evidence of superimposed acute large territory infarct. No intraparenchymal or extra-axial mass or hemorrhage. Unchanged size and configuration of the ventricles and the basilar cisterns. No midline shift. Vascular: Intracranial atherosclerosis. Skull: No displaced calvarial fracture. Sinuses/Orbits: Limited  visualization of the paranasal sinuses and mastoid air cells is normal. No air-fluid levels. Other: Regional soft tissues appear normal. CT CERVICAL SPINE FINDINGS Alignment: C1 to the superior endplate of T2 is imaged. There is straightening expected cervical lordosis. No anterolisthesis or retrolisthesis. The dens is normally positioned between the lateral masses of C1. Mild-to-moderate degenerative change of the atlantodental articulation. Normal atlantoaxial articulations. Skull base and vertebrae: Cervical vertebral body heights are preserved. Prevertebral soft tissues are normal. Soft tissues and spinal canal: Regional soft tissues appear normal. No bulky cervical lymphadenopathy on this noncontrast examination. There is partial ossification the nuchal ligament. Disc levels: There is multi level flowing osteophytosis and ankylosis throughout the cervical spine. Prominent posteriorly directed disc osteophyte complexes are seen most conspicuously at C3-C4 (resulting in at least moderate spinal stenosis at this location-axial image 39, series 8; sagittal image 28, series  4), and to a lesser extent, C5-C6 and C4-C5. Upper chest: Limited visualization the lung apices demonstrates minimal biapical pleuroparenchymal thickening. Other: Note is made of a large (approximately 3.8 x 2.8 cm nodule/mass nearly replacing the left lobe of the thyroid which is noted to contain several internal calcifications. Heterogeneous appearance of the imaged portions of the right lobe of the thyroid. IMPRESSION: 1. Similar findings of atrophy and microvascular ischemic disease without superimposed acute intracranial process. 2. No fracture or static subluxation of the cervical spine. 3. Multi level flowing osteophytosis and ankylosis throughout the cervical spine compatible with DISH. 4. Prominent posteriorly directed disc osteophyte complexes are seen throughout the cervical spine, worse at C3-C4, resulting at least moderate spinal stenosis at this location. Further evaluation with dedicated nonemergent cervical spine MRI performed as indicated. 5. Incidentally noted approximately 3.8 cm nodule/mass nearly replacing the left lobe of the thyroid. Further evaluation with dedicated nonemergent thyroid ultrasound could be performed as clinically indicated. Electronically Signed   By: Sandi Mariscal M.D.   On: 04/01/2017 13:21   Ct Head Wo Contrast  Result Date: 03/26/2017 CLINICAL DATA:  Fall today. Posttraumatic headache. Initial encounter. Personal history of subdural hematoma. EXAM: CT HEAD WITHOUT CONTRAST TECHNIQUE: Contiguous axial images were obtained from the base of the skull through the vertex without intravenous contrast. COMPARISON:  03/24/2015 FINDINGS: Brain: No evidence of acute infarction, hemorrhage, hydrocephalus, extra-axial collection, or mass lesion/mass effect. Stable mild cerebral atrophy and chronic small vessel disease. Vascular:  No hyperdense vessel or other acute findings. Skull: No evidence of fracture or other significant bone abnormality. Sinuses/Orbits:  No acute findings.  Other: None. IMPRESSION: No acute intracranial abnormality. Stable mild cerebral atrophy and chronic small vessel disease. Electronically Signed   By: Earle Gell M.D.   On: 03/26/2017 20:52   Ct Chest W Contrast  Result Date: 03/26/2017 CLINICAL DATA:  84 year old male with abdominal trauma. Generalized soreness secondary to fall. EXAM: CT CHEST, ABDOMEN, AND PELVIS WITH CONTRAST TECHNIQUE: Multidetector CT imaging of the chest, abdomen and pelvis was performed following the standard protocol during bolus administration of intravenous contrast. CONTRAST:  30mL ISOVUE-300 IOPAMIDOL (ISOVUE-300) INJECTION 61% COMPARISON:  Chest radiograph dated 03/25/2017 and CT of the abdomen pelvis dated 02/06/2017 FINDINGS: CT CHEST FINDINGS Cardiovascular: Mild cardiomegaly with dilatation of the right atrium. No pericardial effusion. Postsurgical changes and epicardial wires noted. Left pectoral pacemaker device. There is moderate atherosclerotic calcification of the thoracic aorta. No aneurysmal dilatation or dissection. There is thickened appearance of the wall of the left brachiocephalic trunk. There is partially occlusive thrombus in the subsegmental right lower lobe pulmonary artery branch  which may represent residual thrombus prior PE or scarring with acute nonocclusive thrombus is less likely but not excluded. Correlation with history of previous PE recommended. No large or occlusive pulmonary artery embolus identified. Mediastinum/Nodes: No hilar or mediastinal adenopathy. The esophagus is grossly unremarkable. Bilateral hypodense thyroid nodules measure up to 3.7 cm in the inferior pole of the left thyroid gland. Lungs/Pleura: There is emphysematous changes of the lungs. Postsurgical changes of resection of the previously seen right upper lobe mass. Right lung base subpleural rounded atelectasis or scarring. No new consolidative changes. Small chronic right pleural effusion. No pneumothorax. The central airways are  patent. Musculoskeletal: There is osteopenia with degenerative changes of the spine. No acute osseous pathology. Median sternotomy wires noted. CT ABDOMEN PELVIS FINDINGS No intra-abdominal free air or free fluid. Hepatobiliary: Cholecystectomy. Focal area of hypodensity along the falciform ligament in the left lobe of the liver similar to prior CT and may represent focal fatty infiltration. A smaller subcentimeter hypodense lesion in the inferior aspect of the right lobe of the liver (series 3 image 489) is not well characterized. No intrahepatic biliary ductal dilatation. The gallbladder is surgically absent. Pancreas: Unremarkable. No pancreatic ductal dilatation or surrounding inflammatory changes. Spleen: Dystrophic calcification of the spleen as before. Adrenals/Urinary Tract: Subcentimeter left adrenal adenoma. The right adrenal gland is unremarkable. Probable 1 cm right renal inferior pole cyst. Right renal upper pole subcentimeter hypodensity is not characterized. There is no hydronephrosis on either side. There is symmetric enhancement and excretion of contrast by both kidneys. The visualized ureters and urinary bladder appear unremarkable. Stomach/Bowel: There is sigmoid diverticulosis without active inflammatory changes. No bowel obstruction or active inflammation. Normal appendix. Vascular/Lymphatic: Moderate aortoiliac atherosclerotic disease. The origins of the celiac axis, SMA, IMA are patent. No portal venous gas. There is no adenopathy. Reproductive: The prostate is grossly unremarkable. Other: Epicardial wires noted in the left anterior abdominal wall Musculoskeletal: Degenerative changes of the spine and hips. Large anterior osteophyte at L2-L3 abutting the posterior aorta. No acute osseous pathology. IMPRESSION: 1. No acute/traumatic intrathoracic, abdominal, or pelvic pathology. 2. Nonocclusive pulmonary embolus in the subsegmental right lower lobe pulmonary artery branches likely old PE or  scarring. Nonocclusive acute PE is not entirely excluded. Clinical correlation is recommended. 3. Mild cardiomegaly with dilatation of the right atrium. 4. 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 Small right pleural effusion and stable right lung base round atelectasis/scarring. 5. Aortic Atherosclerosis (ICD10-I70.0) and Emphysema (ICD10-J43.9). These results were called by telephone at the time of interpretation on 03/26/2017 at 11:27 pm to Dr. Marda Stalker , who verbally acknowledged these results. Electronically Signed   By: Anner Crete M.D.   On: 03/26/2017 23:32   Ct Cervical Spine Wo Contrast  Result Date: 04/01/2017 CLINICAL DATA:  Found down by a friend who last saw the patient yesterday. EXAM: CT HEAD WITHOUT CONTRAST CT CERVICAL SPINE WITHOUT CONTRAST TECHNIQUE: Multidetector CT imaging of the head and cervical spine was performed following the standard protocol without intravenous contrast. Multiplanar CT image reconstructions of the cervical spine were also generated. COMPARISON:  Head CT - 03/26/2017; 03/24/2015; 09/10/2012 FINDINGS: CT HEAD FINDINGS Brain: Similar findings atrophy with sulcal prominence. Scattered periventricular hypodensities compatible microvascular ischemic disease, unchanged. Given background parenchymal abnormalities, there is no CT evidence of superimposed acute large territory infarct. No intraparenchymal or extra-axial mass or hemorrhage. Unchanged size and configuration of the ventricles and the basilar cisterns. No midline shift. Vascular: Intracranial atherosclerosis. Skull: No displaced calvarial  fracture. Sinuses/Orbits: Limited visualization of the paranasal sinuses and mastoid air cells is normal. No air-fluid levels. Other: Regional soft tissues appear normal. CT CERVICAL SPINE FINDINGS Alignment: C1 to the superior endplate of T2 is imaged. There is straightening expected cervical lordosis. No anterolisthesis or retrolisthesis.  The dens is normally positioned between the lateral masses of C1. Mild-to-moderate degenerative change of the atlantodental articulation. Normal atlantoaxial articulations. Skull base and vertebrae: Cervical vertebral body heights are preserved. Prevertebral soft tissues are normal. Soft tissues and spinal canal: Regional soft tissues appear normal. No bulky cervical lymphadenopathy on this noncontrast examination. There is partial ossification the nuchal ligament. Disc levels: There is multi level flowing osteophytosis and ankylosis throughout the cervical spine. Prominent posteriorly directed disc osteophyte complexes are seen most conspicuously at C3-C4 (resulting in at least moderate spinal stenosis at this location-axial image 39, series 8; sagittal image 28, series 4), and to a lesser extent, C5-C6 and C4-C5. Upper chest: Limited visualization the lung apices demonstrates minimal biapical pleuroparenchymal thickening. Other: Note is made of a large (approximately 3.8 x 2.8 cm nodule/mass nearly replacing the left lobe of the thyroid which is noted to contain several internal calcifications. Heterogeneous appearance of the imaged portions of the right lobe of the thyroid. IMPRESSION: 1. Similar findings of atrophy and microvascular ischemic disease without superimposed acute intracranial process. 2. No fracture or static subluxation of the cervical spine. 3. Multi level flowing osteophytosis and ankylosis throughout the cervical spine compatible with DISH. 4. Prominent posteriorly directed disc osteophyte complexes are seen throughout the cervical spine, worse at C3-C4, resulting at least moderate spinal stenosis at this location. Further evaluation with dedicated nonemergent cervical spine MRI performed as indicated. 5. Incidentally noted approximately 3.8 cm nodule/mass nearly replacing the left lobe of the thyroid. Further evaluation with dedicated nonemergent thyroid ultrasound could be performed as  clinically indicated. Electronically Signed   By: Sandi Mariscal M.D.   On: 04/01/2017 13:21   Ct Abdomen Pelvis W Contrast  Result Date: 03/26/2017 CLINICAL DATA:  84 year old male with abdominal trauma. Generalized soreness secondary to fall. EXAM: CT CHEST, ABDOMEN, AND PELVIS WITH CONTRAST TECHNIQUE: Multidetector CT imaging of the chest, abdomen and pelvis was performed following the standard protocol during bolus administration of intravenous contrast. CONTRAST:  60mL ISOVUE-300 IOPAMIDOL (ISOVUE-300) INJECTION 61% COMPARISON:  Chest radiograph dated 03/25/2017 and CT of the abdomen pelvis dated 02/06/2017 FINDINGS: CT CHEST FINDINGS Cardiovascular: Mild cardiomegaly with dilatation of the right atrium. No pericardial effusion. Postsurgical changes and epicardial wires noted. Left pectoral pacemaker device. There is moderate atherosclerotic calcification of the thoracic aorta. No aneurysmal dilatation or dissection. There is thickened appearance of the wall of the left brachiocephalic trunk. There is partially occlusive thrombus in the subsegmental right lower lobe pulmonary artery branch which may represent residual thrombus prior PE or scarring with acute nonocclusive thrombus is less likely but not excluded. Correlation with history of previous PE recommended. No large or occlusive pulmonary artery embolus identified. Mediastinum/Nodes: No hilar or mediastinal adenopathy. The esophagus is grossly unremarkable. Bilateral hypodense thyroid nodules measure up to 3.7 cm in the inferior pole of the left thyroid gland. Lungs/Pleura: There is emphysematous changes of the lungs. Postsurgical changes of resection of the previously seen right upper lobe mass. Right lung base subpleural rounded atelectasis or scarring. No new consolidative changes. Small chronic right pleural effusion. No pneumothorax. The central airways are patent. Musculoskeletal: There is osteopenia with degenerative changes of the spine. No  acute osseous pathology. Median sternotomy  wires noted. CT ABDOMEN PELVIS FINDINGS No intra-abdominal free air or free fluid. Hepatobiliary: Cholecystectomy. Focal area of hypodensity along the falciform ligament in the left lobe of the liver similar to prior CT and may represent focal fatty infiltration. A smaller subcentimeter hypodense lesion in the inferior aspect of the right lobe of the liver (series 3 image 489) is not well characterized. No intrahepatic biliary ductal dilatation. The gallbladder is surgically absent. Pancreas: Unremarkable. No pancreatic ductal dilatation or surrounding inflammatory changes. Spleen: Dystrophic calcification of the spleen as before. Adrenals/Urinary Tract: Subcentimeter left adrenal adenoma. The right adrenal gland is unremarkable. Probable 1 cm right renal inferior pole cyst. Right renal upper pole subcentimeter hypodensity is not characterized. There is no hydronephrosis on either side. There is symmetric enhancement and excretion of contrast by both kidneys. The visualized ureters and urinary bladder appear unremarkable. Stomach/Bowel: There is sigmoid diverticulosis without active inflammatory changes. No bowel obstruction or active inflammation. Normal appendix. Vascular/Lymphatic: Moderate aortoiliac atherosclerotic disease. The origins of the celiac axis, SMA, IMA are patent. No portal venous gas. There is no adenopathy. Reproductive: The prostate is grossly unremarkable. Other: Epicardial wires noted in the left anterior abdominal wall Musculoskeletal: Degenerative changes of the spine and hips. Large anterior osteophyte at L2-L3 abutting the posterior aorta. No acute osseous pathology. IMPRESSION: 1. No acute/traumatic intrathoracic, abdominal, or pelvic pathology. 2. Nonocclusive pulmonary embolus in the subsegmental right lower lobe pulmonary artery branches likely old PE or scarring. Nonocclusive acute PE is not entirely excluded. Clinical correlation is  recommended. 3. Mild cardiomegaly with dilatation of the right atrium. 4. 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 Small right pleural effusion and stable right lung base round atelectasis/scarring. 5. Aortic Atherosclerosis (ICD10-I70.0) and Emphysema (ICD10-J43.9). These results were called by telephone at the time of interpretation on 03/26/2017 at 11:27 pm to Dr. Marda Stalker , who verbally acknowledged these results. Electronically Signed   By: Anner Crete M.D.   On: 03/26/2017 23:32   Dg Abdomen Acute W/chest  Result Date: 03/23/2017 CLINICAL DATA:  Abdominal pain with nausea, vomiting, and constipation. EXAM: DG ABDOMEN ACUTE W/ 1V CHEST COMPARISON:  Chest x-ray dated March 03, 2017. CT abdomen pelvis dated February 06, 2017. Abdominal x-rays dated April 14, 2016. FINDINGS: Unchanged left chest wall AICD. Stable cardiomediastinal silhouette. Normal pulmonary vascularity. No focal consolidation, pleural effusion, or pneumothorax. No acute osseous abnormality. There is no evidence of dilated bowel loops or free intraperitoneal air. There are few air-fluid levels in the small bowel on the upright view. Air and stool are seen within the colon and rectum. Bilateral renal calculi, 8 mm on the left, 5 mm on the right. Degenerative changes of the lumbar spine and bilateral hip joints. IMPRESSION: 1. A few air-fluid levels within nondilated small bowel favor enteritis. No evidence of obstruction. 2. Bilateral renal calculi. 3.  No active cardiopulmonary disease. Electronically Signed   By: Titus Dubin M.D.   On: 03/23/2017 08:44    (Echo, Carotid, EGD, Colonoscopy, ERCP)    Subjective:no complaints   Discharge Exam: Vitals:   04/03/17 0830 04/03/17 0849  BP:  106/73  Pulse:  88  Resp:    Temp:    SpO2: 95%    Vitals:   04/02/17 1225 04/03/17 0506 04/03/17 0830 04/03/17 0849  BP: 139/81 136/78  106/73  Pulse: (!) 117 (!) 103  88  Resp: 18 18    Temp: 98.3  F (36.8 C) 98.4 F (36.9 C)  TempSrc: Oral Oral    SpO2: 97% 98% 95%   Weight:  76.2 kg (167 lb 14.4 oz)    Height:        General: Pt is alert, awake, not in acute distress Cardiovascular: RRR, S1/S2 +, no rubs, no gallops Respiratory: CTA bilaterally, no wheezing, no rhonchi Abdominal: Soft, NT, ND, bowel sounds + Extremities: no edema, no cyanosis    The results of significant diagnostics from this hospitalization (including imaging, microbiology, ancillary and laboratory) are listed below for reference.     Microbiology: Recent Results (from the past 240 hour(s))  Urine culture     Status: None   Collection Time: 03/26/17  5:20 PM  Result Value Ref Range Status   Specimen Description   Final    URINE, CLEAN CATCH Performed at [facility], [place] [address], [place], [place] [address]    Special Requests   Final    NONE Performed at [facility], [place] [address], [place], [place] [address]    Culture   Final    NO GROWTH Performed at [facility], [place] [address] [place],  [address]    Report Status 03/28/2017 FINAL  Final     Labs: BNP (last 3 results) No results for input(s): BNP in the last 8760 hours. Basic Metabolic Panel: Recent Labs  Lab 03/27/17 1445 03/29/17 0505 04/01/17 1209 04/02/17 0656 04/03/17 0836  NA  --  140 138 140 139  K  --  3.5 3.6 3.2* 3.8  CL  --  99* 101 106 104  CO2  --  30 22 23 23   GLUCOSE  --  125* 133* 95 115*  BUN  --  16 31* 24* 20  CREATININE 1.19 1.15 1.88* 1.36* 1.13  CALCIUM  --  9.4 9.0 8.6* 8.9  MG  --  1.6*  --   --   --    Liver Function Tests: Recent Labs  Lab 04/01/17 1209 04/03/17 0836  AST 31 33  ALT 11* 13*  ALKPHOS 55 49  BILITOT 2.6* 2.7*  PROT 6.5 6.3*  ALBUMIN 3.7 3.4*   No results for input(s): LIPASE, AMYLASE in the last 168 hours. No results for input(s): AMMONIA in the last 168 hours. CBC: Recent Labs  Lab 03/27/17 1445 04/01/17 1424  04/02/17 0656  WBC 7.6 9.1 6.0  NEUTROABS  --  5.1  --   HGB 14.4 14.1 12.5*  HCT 43.4 42.7 38.5*  MCV 91.9 92.6 93.7  PLT 165 113* 100*   Cardiac Enzymes: Recent Labs  Lab 04/01/17 1209 04/01/17 2101  CKTOTAL 262  --   TROPONINI  --  0.06*   BNP: Invalid input(s): POCBNP CBG: No results for input(s): GLUCAP in the last 168 hours. D-Dimer No results for input(s): DDIMER in the last 72 hours. Hgb A1c No results for input(s): HGBA1C in the last 72 hours. Lipid Profile No results for input(s): CHOL, HDL, LDLCALC, TRIG, CHOLHDL, LDLDIRECT in the last 72 hours. Thyroid function studies No results for input(s): TSH, T4TOTAL, T3FREE, THYROIDAB in the last 72 hours.  Invalid input(s): FREET3 Anemia work up No results for input(s): VITAMINB12, FOLATE, FERRITIN, TIBC, IRON, RETICCTPCT in the last 72 hours. Urinalysis    Component Value Date/Time   COLORURINE AMBER (A) 04/01/2017 2046   APPEARANCEUR CLOUDY (A) 04/01/2017 2046   LABSPEC 1.025 04/01/2017 2046   PHURINE 5.0 04/01/2017 2046   GLUCOSEU NEGATIVE 04/01/2017 2046   HGBUR LARGE (A) 04/01/2017  2046   [place] NEGATIVE 04/01/2017 2046   KETONESUR 5 (A) 04/01/2017 2046   PROTEINUR 100 (A) 04/01/2017 2046   UROBILINOGEN 0.2 09/09/2012 1501   NITRITE NEGATIVE 04/01/2017 2046   LEUKOCYTESUR TRACE (A) 04/01/2017 2046   Sepsis Labs Invalid input(s): PROCALCITONIN,  WBC,  LACTICIDVEN Microbiology Recent Results (from the past 240 hour(s))  Urine culture     Status: None   Collection Time: 03/26/17  5:20 PM  Result Value Ref Range Status   Specimen Description   Final    URINE, CLEAN CATCH Performed at [facility], [place] [address] [place], [place] [address]    Special Requests   Final    NONE Performed at [facility], [place] [address] [place], [place] [address]    Culture   Final    NO GROWTH Performed at [facility], [place] [address], [place], [place] [address]     Report Status 03/28/2017 FINAL  Final     Time coordinating discharge: Over 30 minutes  SIGNED:   Georgette Shell, MD  [facility] 04/03/2017, 11:59 AM  If 7PM-7AM, please contact night-coverage [email or web address] Password [email or web address]

## 2017-04-03 NOTE — Progress Notes (Signed)
Pt is back in bed. Bed alarm on. Bed is in lowest positions. Mats are on both sides of bed. Call light and side table within pt's reach.

## 2017-04-03 NOTE — Progress Notes (Signed)
RN applied tegaderm to skin tear on pt's right and left forearm. RN placed a bandaid on pt's pointer finger on right hand.

## 2017-04-03 NOTE — Consult Note (Signed)
   [facility] CM Inpatient Consult   04/03/2017  [name] Sep 05, 1932 [id number]  Patient is currently active with [facility] for chronic disease management services.  Patient has been engaged by a [employer] and CSW.  Currently, patient is being recommended for skilled nursing facility [SNF].  Patient is currently declining SNF.  Patient may be a candidate for Home First program with [facility]    Our community based plan of care has focused on disease management and community resource support.  Patient will receive a post discharge transition of care call and will be evaluated for monthly home visits for assessments and disease process education.  Made Inpatient Case Manager aware that [facility] following. Of note, [facility] Care Management services does not replace or interfere with any services that are needed or arranged by inpatient case management or social work. Will follow recommendations and follow up with inpatient RNCM/CSW.    For additional questions or referrals please contact:  [name], RN BSN [facility] Liaison  [phone] business mobile phone Toll free office [phone]

## 2017-04-03 NOTE — Clinical Social Work Note (Signed)
Clinical Social Work Assessment  Patient Details  Name: [name] MRN: [id number] Date of Birth: [birth year]  Date of referral:  04/03/17               Reason for consult:  Facility Placement, Discharge Planning                Permission sought to share information with:  Facility Sport and exercise psychologist, Other Permission granted to share information::  Yes, Verbal Permission Granted  Name::     Engineer, maintenance (IT)::     Relationship::  Friend  Contact Information:  [phone]  Housing/Transportation Living arrangements for the past 2 months:  Apartment Source of Information:  Patient, Medical Team, Other (Comment Required)(Friend) Patient Interpreter Needed:  None Criminal Activity/Legal Involvement Pertinent to Current Situation/Hospitalization:  No - Comment as needed Significant Relationships:  Adult Children, Friend Lives with:  Self Do you feel safe going back to the place where you live?  Yes Need for family participation in patient care:  Yes (Comment)  Care giving concerns:  PT recommending SNF once medically stable for discharge.   Social Worker assessment / plan:  CSW met with patient. Laverda Sorenson, at bedside. Patient is agreeable to CSW discussing PT recommendations in front of his friend. Patient stated he prefers to go home with home health rather than SNF due to a previous bad experience at a SNF. [name] confirmed that they are working on patient having increased assistance at home. Patient is not currently set up with a home health company. No further concerns. CSW signing off as social work intervention is no longer needed.  Employment status:  Retired Forensic scientist:  Medicare PT Recommendations:  [place] / Referral to community resources:  [place]  Patient/Family's Response to care:  Patient and his friend prefer home health. Patient's family and friends supportive and involved in patient's care. Patient and  his friend appreciated social work intervention.  Patient/Family's Understanding of and Emotional Response to Diagnosis, Current Treatment, and Prognosis:  Patient and his friend have a good understanding of the reason for admission and his need for therapy after discharge. Patient and his friend appear happy with hospital care.  Emotional Assessment Appearance:  Appears stated age Attitude/Demeanor/Rapport:  Engaged, Gracious Affect (typically observed):  Accepting, Appropriate, Calm, Pleasant Orientation:  Oriented to Self, Oriented to Place, Oriented to  Time, Oriented to Situation Alcohol / Substance use:  Never Used Psych involvement (Current and /or in the community):  No (Comment)  Discharge Needs  Concerns to be addressed:  Care Coordination Readmission within the last 30 days:  Yes Current discharge risk:  Dependent with Mobility, Lives alone Barriers to Discharge:  Continued Medical Work up   Candie Chroman, LCSW 04/03/2017, 1:33 PM

## 2017-04-03 NOTE — Progress Notes (Signed)
PROGRESS NOTE    [name]  QMV:[id number] DOB: 07/05/1932 DOA: 04/01/2017 PCP: [name], MD  Brief Narrative:84 y.o.malewith medical history significantfor afibnot on anticoagulation due to prior subdural hemorrhage, diabetes, hypertension presents to emergency Department chief complaint unwitnessed fall. Initial evaluation reveals acute kidney injury dehydration. [facility] are asked to admit  Information is obtained from the patient and his neighbor who is at the bedside. Neighbor says he goes over to the house a couple days a week to "check on him". He went this morning and patient did not answer the door. The neighbor no solid patient on the kitchen floor. Patient was unable to call to door to unlock it but he was conscious and conversing with the neighbor. Neighbor found and then locked window and entered the house. EMS was called and patient was transported to the emergency department. Patient has no recollection of how he ended up on the floor and is not sure how long he was on the floor. He denies headache dizziness syncope or near-syncope. He denies chest pain palpitations shortness of breath. He denies nausea vomiting diarrhea. Neighbor indicates there was no indication that patient had been eatingand the house was "dirty".  Of note patient was admitted several weeks ago for pulmonary embolus but was not started on blood thinners at that time due to high fall risk and previous subdural hemorrhage.  ED Course:In the emergency department he's afebrile hemodynamically stable with a blood pressure the high and of normal he is tachycardic    Assessment & Plan:   Principal Problem:   Acute kidney injury ([place]) Active Problems:   Chronic atrial fibrillation (HCC)   Chronic systolic heart failure- EF 35-40%   Anemia, iron deficiency   Dehydration   History of subdural hemorrhage   Thrombocytopenia (HCC)   ICD (implantable cardioverter-defibrillator) in  place   Chronic pulmonary embolism (HCC)   Syncope   Thyroid nodule   #1. Acute kidney injury. Likely related to dehydration secondary to decreased oral intake. Creatinine 1.88 upon admission now down to 1.36..2 days ago creatinine within the limits of normal. -Gentle IV fluids -Hold nephrotoxins -Monitor urine output -Recheck in the morning  #2. Fall/syncope. Patient had an unwitnessed fall but was conscious when found. His recollection of circumstances around fall unclear.Does not believe he syncopized or hit his head. He has had an unsteady gait.Initial troponin negative. EKG without acute changes. No signs of infection. No metabolic derangement. CT of the head without acute abnormality. He was evaluated by physical therapy during recent hospitalization who recommended snuff and he refused.He has no family members friends who can assist him. -Gentle IV fluids -Obtain orthostatics -Cycle troponin troponin 0 0.06 will follow the third level. -hold altering meds -Social worker to assist with placement  #3. Chronic systolic heart failure. EF 35%. Home medications include digoxin, metoprolol. Dig level below therapeutic -Continue dig, dig level 0.3 -Decrease the dose of metoprolol -Hold Lasix for now -Monitor intake and output -obtain daily weights  #4. Chronic atrial fibrillation.Automatic implantable cardioverter-defibrillator in situ.Not on anticoagulation due to history of subdural hemorrhage and high fall risk. Heart rate 112 the point of admission. EKG as noted above. -Continue home meds -monitor  #5. Chronic pulmonary embolism. Patient recently hospitalized fall. Her cup revealed right-sided PE thought to be chronic but could not rule out acute. CCM opines likely old. He has no respiratory symptoms no hypoxemia.Recent lower shin many ultrasound duplex negative for DVT.Cardiology also consulted and opined patient poor candidate  for anticoagulation -Monitor oxygen  saturation level  #6. Hypertension. Controlled the emergency department. Home medications include metoprolol and Lasix.  Lasix currently on hold decrease the dose of metoprolol his blood pressure is low to low normal. -Holding Lasix for now -Continue beta blocker  #7. Review indicates patient threatened to leave AMA last hospitalization. He was evaluated by psych opined not capable of making decisions for himself. At that point his son was contacted and gave consent for patient to be discharged home  8] hypokalemia replete and recheck.  9] elevated bilirubin question etiology looks like his bilirubin has gone up from 1.6-2.7 today.  We will follow-up levels tomorrow.  Pending today.      DVT prophylaxisscd Code Status:full Family Communication:none Disposition Plan: Discharge to skilled nursing facility in 24 hours.  Consultants: None  Procedures: None Antimicrobials none Subjective: Sitting by the side of the bed awake alert in no acute distress denies any specific complaints today.  Objective: Vitals:   04/02/17 1225 04/03/17 0506 04/03/17 0830 04/03/17 0849  BP: 139/81 136/78  106/73  Pulse: (!) 117 (!) 103  88  Resp: 18 18    Temp: 98.3 F (36.8 C) 98.4 F (36.9 C)    TempSrc: Oral Oral    SpO2: 97% 98% 95%   Weight:  76.2 kg (167 lb 14.4 oz)    Height:        Intake/Output Summary (Last 24 hours) at 04/03/2017 1011 Last data filed at 04/02/2017 2130 Gross per 24 hour  Intake 120 ml  Output 100 ml  Net 20 ml   Filed Weights   04/01/17 1640 04/02/17 0722 04/03/17 0506  Weight: 75.9 kg (167 lb 5.3 oz) 76.1 kg (167 lb 12.8 oz) 76.2 kg (167 lb 14.4 oz)    Examination:  General exam: Appears calm and comfortable  Respiratory system: Clear to auscultation. Respiratory effort normal. Cardiovascular system: S1 & S2 heard, RRR. No JVD, murmurs, rubs, gallops or clicks. No pedal edema. Gastrointestinal system: Abdomen is nondistended, soft and nontender. No  organomegaly or masses felt. Normal bowel sounds heard. Central nervous system: Alert and oriented. No focal neurological deficits. Extremities: Symmetric 5 x 5 power. Skin: No rashes, lesions or ulcers Psychiatry: Judgement and insight appear normal. Mood & affect appropriate.     Data Reviewed: I have personally reviewed following labs and imaging studies  CBC: Recent Labs  Lab 03/27/17 1445 04/01/17 1424 04/02/17 0656  WBC 7.6 9.1 6.0  NEUTROABS  --  5.1  --   HGB 14.4 14.1 12.5*  HCT 43.4 42.7 38.5*  MCV 91.9 92.6 93.7  PLT 165 113* 431*   Basic Metabolic Panel: Recent Labs  Lab 03/27/17 1445 03/29/17 0505 04/01/17 1209 04/02/17 0656  NA  --  140 138 140  K  --  3.5 3.6 3.2*  CL  --  99* 101 106  CO2  --  30 22 23   GLUCOSE  --  125* 133* 95  BUN  --  16 31* 24*  CREATININE 1.19 1.15 1.88* 1.36*  CALCIUM  --  9.4 9.0 8.6*  MG  --  1.6*  --   --    GFR: Estimated Creatinine Clearance: 45.1 mL/min (A) (by C-G formula based on SCr of 1.36 mg/dL (H)). Liver Function Tests: Recent Labs  Lab 04/01/17 1209  AST 31  ALT 11*  ALKPHOS 55  BILITOT 2.6*  PROT 6.5  ALBUMIN 3.7   No results for input(s): LIPASE, AMYLASE in the last 168  hours. No results for input(s): AMMONIA in the last 168 hours. Coagulation Profile: No results for input(s): INR, PROTIME in the last 168 hours. Cardiac Enzymes: Recent Labs  Lab 04/01/17 1209 04/01/17 2101  CKTOTAL 262  --   TROPONINI  --  0.06*   BNP (last 3 results) No results for input(s): PROBNP in the last 8760 hours. HbA1C: No results for input(s): HGBA1C in the last 72 hours. CBG: No results for input(s): GLUCAP in the last 168 hours. Lipid Profile: No results for input(s): CHOL, HDL, LDLCALC, TRIG, CHOLHDL, LDLDIRECT in the last 72 hours. Thyroid Function Tests: No results for input(s): TSH, T4TOTAL, FREET4, T3FREE, THYROIDAB in the last 72 hours. Anemia Panel: No results for input(s): VITAMINB12, FOLATE,  FERRITIN, TIBC, IRON, RETICCTPCT in the last 72 hours. Sepsis Labs: No results for input(s): PROCALCITON, LATICACIDVEN in the last 168 hours.  Recent Results (from the past 240 hour(s))  Urine culture     Status: None   Collection Time: 03/26/17  5:20 PM  Result Value Ref Range Status   Specimen Description   Final    URINE, CLEAN CATCH Performed at [facility], [place] [address], [place], [place] [address]    Special Requests   Final    NONE Performed at [facility], [place] [address], [place], [place] [address]    Culture   Final    NO GROWTH Performed at [facility], [place] [address], [place],  [address]    Report Status 03/28/2017 FINAL  Final         Radiology Studies: Dg Chest 2 View  Result Date: 04/01/2017 CLINICAL DATA:  Pain after fall EXAM: CHEST  2 VIEW COMPARISON:  March 25, 2017 FINDINGS: Postsurgical changes in the chest with sternotomy wires. Mild cardiomegaly. The hila and mediastinum are normal. No pneumothorax. No nodules or masses. No focal infiltrates. IMPRESSION: No active cardiopulmonary disease. Electronically Signed   By: Dorise Bullion III M.D   On: 04/01/2017 12:42   Dg Pelvis 1-2 Views  Result Date: 04/01/2017 CLINICAL DATA:  Fall.  Pain. EXAM: PELVIS - 1-2 VIEW COMPARISON:  None. FINDINGS: Degenerative changes in the hips, right greater than left. No acute fractures are seen. IMPRESSION: Degenerative changes in the hips, right greater than left. Electronically Signed   By: Dorise Bullion III M.D   On: 04/01/2017 12:43   Ct Head Wo Contrast  Result Date: 04/01/2017 CLINICAL DATA:  Found down by a friend who last saw the patient yesterday. EXAM: CT HEAD WITHOUT CONTRAST CT CERVICAL SPINE WITHOUT CONTRAST TECHNIQUE: Multidetector CT imaging of the head and cervical spine was performed following the standard protocol without intravenous contrast. Multiplanar CT image reconstructions of the cervical spine  were also generated. COMPARISON:  Head CT - 03/26/2017; 03/24/2015; 09/10/2012 FINDINGS: CT HEAD FINDINGS Brain: Similar findings atrophy with sulcal prominence. Scattered periventricular hypodensities compatible microvascular ischemic disease, unchanged. Given background parenchymal abnormalities, there is no CT evidence of superimposed acute large territory infarct. No intraparenchymal or extra-axial mass or hemorrhage. Unchanged size and configuration of the ventricles and the basilar cisterns. No midline shift. Vascular: Intracranial atherosclerosis. Skull: No displaced calvarial fracture. Sinuses/Orbits: Limited visualization of the paranasal sinuses and mastoid air cells is normal. No air-fluid levels. Other: Regional soft tissues appear normal. CT CERVICAL SPINE FINDINGS Alignment: C1 to the superior endplate of T2 is imaged. There is straightening expected cervical lordosis. No anterolisthesis or retrolisthesis. The dens is normally positioned between the lateral masses  of C1. Mild-to-moderate degenerative change of the atlantodental articulation. Normal atlantoaxial articulations. Skull base and vertebrae: Cervical vertebral body heights are preserved. Prevertebral soft tissues are normal. Soft tissues and spinal canal: Regional soft tissues appear normal. No bulky cervical lymphadenopathy on this noncontrast examination. There is partial ossification the nuchal ligament. Disc levels: There is multi level flowing osteophytosis and ankylosis throughout the cervical spine. Prominent posteriorly directed disc osteophyte complexes are seen most conspicuously at C3-C4 (resulting in at least moderate spinal stenosis at this location-axial image 39, series 8; sagittal image 28, series 4), and to a lesser extent, C5-C6 and C4-C5. Upper chest: Limited visualization the lung apices demonstrates minimal biapical pleuroparenchymal thickening. Other: Note is made of a large (approximately 3.8 x 2.8 cm nodule/mass nearly  replacing the left lobe of the thyroid which is noted to contain several internal calcifications. Heterogeneous appearance of the imaged portions of the right lobe of the thyroid. IMPRESSION: 1. Similar findings of atrophy and microvascular ischemic disease without superimposed acute intracranial process. 2. No fracture or static subluxation of the cervical spine. 3. Multi level flowing osteophytosis and ankylosis throughout the cervical spine compatible with DISH. 4. Prominent posteriorly directed disc osteophyte complexes are seen throughout the cervical spine, worse at C3-C4, resulting at least moderate spinal stenosis at this location. Further evaluation with dedicated nonemergent cervical spine MRI performed as indicated. 5. Incidentally noted approximately 3.8 cm nodule/mass nearly replacing the left lobe of the thyroid. Further evaluation with dedicated nonemergent thyroid ultrasound could be performed as clinically indicated. Electronically Signed   By: Sandi Mariscal M.D.   On: 04/01/2017 13:21   Ct Cervical Spine Wo Contrast  Result Date: 04/01/2017 CLINICAL DATA:  Found down by a friend who last saw the patient yesterday. EXAM: CT HEAD WITHOUT CONTRAST CT CERVICAL SPINE WITHOUT CONTRAST TECHNIQUE: Multidetector CT imaging of the head and cervical spine was performed following the standard protocol without intravenous contrast. Multiplanar CT image reconstructions of the cervical spine were also generated. COMPARISON:  Head CT - 03/26/2017; 03/24/2015; 09/10/2012 FINDINGS: CT HEAD FINDINGS Brain: Similar findings atrophy with sulcal prominence. Scattered periventricular hypodensities compatible microvascular ischemic disease, unchanged. Given background parenchymal abnormalities, there is no CT evidence of superimposed acute large territory infarct. No intraparenchymal or extra-axial mass or hemorrhage. Unchanged size and configuration of the ventricles and the basilar cisterns. No midline shift. Vascular:  Intracranial atherosclerosis. Skull: No displaced calvarial fracture. Sinuses/Orbits: Limited visualization of the paranasal sinuses and mastoid air cells is normal. No air-fluid levels. Other: Regional soft tissues appear normal. CT CERVICAL SPINE FINDINGS Alignment: C1 to the superior endplate of T2 is imaged. There is straightening expected cervical lordosis. No anterolisthesis or retrolisthesis. The dens is normally positioned between the lateral masses of C1. Mild-to-moderate degenerative change of the atlantodental articulation. Normal atlantoaxial articulations. Skull base and vertebrae: Cervical vertebral body heights are preserved. Prevertebral soft tissues are normal. Soft tissues and spinal canal: Regional soft tissues appear normal. No bulky cervical lymphadenopathy on this noncontrast examination. There is partial ossification the nuchal ligament. Disc levels: There is multi level flowing osteophytosis and ankylosis throughout the cervical spine. Prominent posteriorly directed disc osteophyte complexes are seen most conspicuously at C3-C4 (resulting in at least moderate spinal stenosis at this location-axial image 39, series 8; sagittal image 28, series 4), and to a lesser extent, C5-C6 and C4-C5. Upper chest: Limited visualization the lung apices demonstrates minimal biapical pleuroparenchymal thickening. Other: Note is made of a large (approximately 3.8 x 2.8 cm nodule/mass nearly  replacing the left lobe of the thyroid which is noted to contain several internal calcifications. Heterogeneous appearance of the imaged portions of the right lobe of the thyroid. IMPRESSION: 1. Similar findings of atrophy and microvascular ischemic disease without superimposed acute intracranial process. 2. No fracture or static subluxation of the cervical spine. 3. Multi level flowing osteophytosis and ankylosis throughout the cervical spine compatible with DISH. 4. Prominent posteriorly directed disc osteophyte complexes  are seen throughout the cervical spine, worse at C3-C4, resulting at least moderate spinal stenosis at this location. Further evaluation with dedicated nonemergent cervical spine MRI performed as indicated. 5. Incidentally noted approximately 3.8 cm nodule/mass nearly replacing the left lobe of the thyroid. Further evaluation with dedicated nonemergent thyroid ultrasound could be performed as clinically indicated. Electronically Signed   By: Sandi Mariscal M.D.   On: 04/01/2017 13:21        Scheduled Meds: . digoxin  0.125 mg Oral Daily  . famotidine  10 mg Oral Daily  . metoprolol succinate  25 mg Oral Daily  . mometasone-formoterol  2 puff Inhalation BID   Continuous Infusions:   LOS: 1 day       Georgette Shell, MD [facility] If 7PM-7AM, please contact night-coverage [email or web address] Password [email or web address] 04/03/2017, 10:11 AM

## 2017-04-04 ENCOUNTER — Encounter (HOSPITAL_COMMUNITY): Payer: Self-pay

## 2017-04-04 ENCOUNTER — Encounter: Payer: Self-pay | Admitting: *Deleted

## 2017-04-04 ENCOUNTER — Inpatient Hospital Stay: Payer: Medicare Other | Admitting: Family Medicine

## 2017-04-04 ENCOUNTER — Inpatient Hospital Stay (HOSPITAL_COMMUNITY): Payer: Medicare Other

## 2017-04-04 ENCOUNTER — Other Ambulatory Visit: Payer: Self-pay | Admitting: *Deleted

## 2017-04-04 LAB — COMPREHENSIVE METABOLIC PANEL
ALK PHOS: 46 U/L (ref 38–126)
ALT: 11 U/L — AB (ref 17–63)
AST: 24 U/L (ref 15–41)
Albumin: 3.1 g/dL — ABNORMAL LOW (ref 3.5–5.0)
Anion gap: 11 (ref 5–15)
BUN: 15 mg/dL (ref 6–20)
CALCIUM: 8.5 mg/dL — AB (ref 8.9–10.3)
CHLORIDE: 107 mmol/L (ref 101–111)
CO2: 22 mmol/L (ref 22–32)
Creatinine, Ser: 1 mg/dL (ref 0.61–1.24)
GFR calc Af Amer: >60 mL/min (ref >60)
Glucose, Bld: 82 mg/dL (ref 65–99)
Potassium: 3.5 mmol/L (ref 3.5–5.1)
Sodium: 140 mmol/L (ref 135–145)
Total Bilirubin: 1.6 mg/dL — ABNORMAL HIGH (ref 0.3–1.2)
Total Protein: 5.9 g/dL — ABNORMAL LOW (ref 6.5–8.1)

## 2017-04-04 LAB — MRSA PCR SCREENING: MRSA by PCR: NEGATIVE

## 2017-04-04 MED ORDER — PHENOL 1.4 % MT LIQD: 1.0000 | OROMUCOSAL | 0 refills | Status: DC | PRN

## 2017-04-04 MED ORDER — METOPROLOL SUCCINATE ER 25 MG PO TB24: 25.0000 mg | ORAL_TABLET | Freq: Every day | ORAL | Status: DC

## 2017-04-04 MED ORDER — PHENOL 1.4 % MT LIQD
1.0000 | OROMUCOSAL | Status: DC | PRN
  Administered 2017-04-04: 1 via OROMUCOSAL
  Filled 2017-04-04: qty 177

## 2017-04-04 NOTE — Progress Notes (Signed)
Pt swabbed for MRSA pcr per protocol, pt has a history of MRSA +. Pt was educated with understanding. Pt placed on contact precaution for now u til final result is up. Will continue to monitor pt.

## 2017-04-04 NOTE — Progress Notes (Signed)
Pt HR is now 126.

## 2017-04-04 NOTE — Progress Notes (Signed)
MD states for pt to receive scheduled 25mg  metoprolol. Pt received this at 0850. MD is aware. No new orders.

## 2017-04-04 NOTE — Progress Notes (Signed)
Pt discharge instructions reviewed with pt. Pt verbalizes understanding. Pt belongings with pt. Pt is not in distress. Pt discharged via wheelchair. Pt's friend [name] is driving him home.

## 2017-04-04 NOTE — Progress Notes (Signed)
Pt heart rate is sustaining in 140's. Pt is not asymptomatic. Pt is in the bathroom having a bowel movement. MD is aware. No new orders. Will continue to monitor.

## 2017-04-04 NOTE — Progress Notes (Signed)
Rolling walker ordered and to be delivered to his room today prior to dc home today; Aneta Mins [phone]

## 2017-04-04 NOTE — Patient Outreach (Signed)
[facility]) Care Management  04/04/2017  REY DANSBY 1932-11-18 [id number]  Case [facility] liaison reports pt will be with [facility] and no longer eligible for [facility] services at this time. Case will be closure due to another case management services involved. Will alert CMA, team involved and provider of pt's disposition with [place].  Raina Mina, RN Care Management Coordinator [place] Office [phone]

## 2017-04-04 NOTE — Progress Notes (Signed)
Discontinued contact precaution, MRSA pcr was negative. Will continue to monitor pt.

## 2017-04-04 NOTE — Patient Outreach (Signed)
[facility]) Care Management  04/04/2017  [name] 04-27-1932 [id number]    Transition of care  RN attempt outreach call today however unsuccessful and unable to leave a voice message (mailbox full). Will attempt outreach call again tomorrow for pending [facility] services.   Raina Mina, RN Care Management Coordinator [place] Office [phone]

## 2017-04-04 NOTE — Evaluation (Signed)
Occupational Therapy Evaluation Patient Details Name: [name] MRN: [id number] DOB: [birth year] Today's Date: 04/04/2017    History of Present Illness [name] is a 84 y.o. male with a Past Medical History significant for afib, DM, htn who presents with fall. Found down by neighbor.  Admitted with dehydration and history of ICH.   Clinical Impression   Pt completes functional mobility and selfcare tasks at a min guard to supervision level overall with use of DME sit to stand.  He will benefit from acute care OT to help increase strength and balance for safer performance of these tasks.  Feel he will benefit from short SNF rehab, but he will not agree.  HHOT recommended at discharge.     Follow Up Recommendations  Home health OT    Equipment Recommendations  None recommended by OT       Precautions / Restrictions Precautions Precautions: Fall Precaution Comments: Hx of falls at home  Restrictions Weight Bearing Restrictions: No LUE Weight Bearing: Weight bearing as tolerated      Mobility Bed Mobility Overal bed mobility: Needs Assistance Bed Mobility: Supine to Sit     Supine to sit: Min assist     General bed mobility comments: Increased difficulty with transitioning trunk upright to sitting.   Transfers Overall transfer level: Needs assistance Equipment used: Rolling walker (2 wheeled) Transfers: Sit to/from Stand Sit to Stand: Min guard         General transfer comment: Pt able to complete sit to stand with supervision from EOB.      Balance Overall balance assessment: Needs assistance Sitting-balance support: No upper extremity supported;Feet supported Sitting balance-Leahy Scale: Good     Standing balance support: During functional activity Standing balance-Leahy Scale: Fair Standing balance comment: Pt needs use of UEs on walker for mobility but could stand statically with supervision during grooming tasks using BUEs.                             ADL either performed or assessed with clinical judgement   ADL Overall ADL's : Needs assistance/impaired Eating/Feeding: Set up;Sitting   Grooming: Wash/dry face;Wash/dry hands;Supervision/safety;Standing   Upper Body Bathing: Supervision/ safety;Sitting   Lower Body Bathing: Supervison/ safety;Sit to/from stand   Upper Body Dressing : Set up;Sitting   Lower Body Dressing: Supervision/safety;Sit to/from stand   Toilet Transfer: Supervision/safety;Comfort height toilet;RW   Toileting- Water quality scientist and Hygiene: Supervision/safety;Sit to/from stand       Functional mobility during ADLs: Min guard;Rolling walker General ADL Comments: Pt supervision to min guard assist for all tasks with use of the RW for support.  Lives alone and would benefit from short term SNF level therapy before transfer home, however will not agree.  Recommend HHOT eval and treat for safety.   Pt's HR increased to 142 bpm with mobility.  At rest would vary from 130-114.      Vision Baseline Vision/History: Wears glasses Wears Glasses: At all times Patient Visual Report: No change from baseline Vision Assessment?: No apparent visual deficits            Pertinent Vitals/Pain Pain Assessment: Faces Faces Pain Scale: Hurts a little bit Pain Location: right knee Pain Descriptors / Indicators: Aching Pain Intervention(s): Monitored during session;Limited activity within patient's tolerance;Repositioned     Hand Dominance Right   Extremity/Trunk Assessment Upper Extremity Assessment Upper Extremity Assessment: Generalized weakness   Lower Extremity Assessment Lower Extremity Assessment: Defer to  PT evaluation   Cervical / Trunk Assessment Cervical / Trunk Assessment: Kyphotic   Communication Communication Communication: No difficulties   Cognition Arousal/Alertness: Awake/alert Behavior During Therapy: WFL for tasks assessed/performed Overall Cognitive Status: No  family/caregiver present to determine baseline cognitive functioning Area of Impairment: Orientation                 Orientation Level: Time   Memory: Decreased short-term memory Following Commands: Follows one step commands consistently Safety/Judgement: Decreased awareness of safety;Decreased awareness of deficits Awareness: Anticipatory Problem Solving: Requires verbal cues General Comments: He was able to state place and that he had a fall, but continued asking X @ if "they figured out what happened to him".               Home Living Family/patient expects to be discharged to:: Private residence Living Arrangements: Alone Available Help at Discharge: Neighbor;Other (Comment)(checks in a few times a week) Type of Home: House(condiminium) Home Access: Level entry     Home Layout: One level     Bathroom Shower/Tub: Walk-in shower(built in shower seat)   Bathroom Toilet: Handicapped height Bathroom Accessibility: Yes   Home Equipment: Environmental consultant - 2 wheels;Walker - 4 wheels;Cane - single point;Grab bars - toilet;Grab bars - tub/shower          Prior Functioning/Environment Level of Independence: Independent with assistive device(s)                 OT Problem List: Decreased strength;Impaired balance (sitting and/or standing);Decreased safety awareness;Decreased activity tolerance;Pain      OT Treatment/Interventions: Self-care/ADL training;DME and/or AE instruction;Cognitive remediation/compensation;Patient/family education;Other (comment);Balance training;Therapeutic activities;Therapeutic exercise    OT Goals(Current goals can be found in the care plan section) Acute Rehab OT Goals Patient Stated Goal: To go home  Time For Goal Achievement: 04/11/17 Potential to Achieve Goals: Good  OT Frequency: Min 2X/week   Barriers to D/C: Decreased caregiver support  Pt lives alone and relies on neighbors if he needs assistance.           AM-PAC PT "6 Clicks"  Daily Activity     Outcome Measure Help from another person eating meals?: None Help from another person taking care of personal grooming?: None Help from another person toileting, which includes using toliet, bedpan, or urinal?: A Little Help from another person bathing (including washing, rinsing, drying)?: A Little Help from another person to put on and taking off regular upper body clothing?: None Help from another person to put on and taking off regular lower body clothing?: A Little 6 Click Score: 21   End of Session Equipment Utilized During Treatment: Rolling walker Nurse Communication: Mobility status  Activity Tolerance: Patient tolerated treatment well Patient left: in chair;with call bell/phone within reach;with chair alarm set  OT Visit Diagnosis: Unsteadiness on feet (R26.81);Repeated falls (R29.6);Muscle weakness (generalized) (M62.81)                Time: 7209-4709 OT Time Calculation (min): 21 min Charges:  OT Evaluation $OT Eval Low Complexity: 1 Low OT Treatments $Self Care/Home Management : 8-22 mins   Reigan Tolliver OTR/L 04/04/2017, 8:41 AM

## 2017-04-04 NOTE — Progress Notes (Signed)
RN called pt's friend [name] @ [phone]) per pt request to let him know pt will be discharged. [name] states he will arrive shortly to drive pt home.

## 2017-04-04 NOTE — Progress Notes (Signed)
Pt states his throat is sore. RN placed standing order for chloraseptic spray.

## 2017-04-05 ENCOUNTER — Encounter: Payer: Self-pay | Admitting: Internal Medicine

## 2017-04-05 DIAGNOSIS — N179 Acute kidney failure, unspecified: Secondary | ICD-10-CM | POA: Diagnosis not present

## 2017-04-05 DIAGNOSIS — I482 Chronic atrial fibrillation: Secondary | ICD-10-CM | POA: Diagnosis not present

## 2017-04-06 ENCOUNTER — Telehealth: Payer: Self-pay | Admitting: Family Medicine

## 2017-04-06 DIAGNOSIS — I482 Chronic atrial fibrillation: Secondary | ICD-10-CM | POA: Diagnosis not present

## 2017-04-06 DIAGNOSIS — N179 Acute kidney failure, unspecified: Secondary | ICD-10-CM | POA: Diagnosis not present

## 2017-04-06 NOTE — Telephone Encounter (Signed)
Called pt for TCM.  No answer.  Voicemail full.  Unable to leave message.

## 2017-04-07 ENCOUNTER — Emergency Department (HOSPITAL_COMMUNITY): Payer: Medicare Other

## 2017-04-07 ENCOUNTER — Other Ambulatory Visit: Payer: Self-pay

## 2017-04-07 ENCOUNTER — Encounter (HOSPITAL_COMMUNITY): Payer: Self-pay | Admitting: Emergency Medicine

## 2017-04-07 ENCOUNTER — Inpatient Hospital Stay (HOSPITAL_COMMUNITY)
Admission: EM | Admit: 2017-04-07 | Discharge: 2017-04-26 | DRG: 871 | Disposition: A | Discharge status: Skilled Nursing Facility | Payer: Medicare Other | Attending: Internal Medicine | Admitting: Internal Medicine

## 2017-04-07 ENCOUNTER — Inpatient Hospital Stay: Payer: Self-pay | Admitting: Family Medicine

## 2017-04-07 DIAGNOSIS — I130 Hypertensive heart and chronic kidney disease with heart failure and stage 1 through stage 4 chronic kidney disease, or unspecified chronic kidney disease: Secondary | ICD-10-CM | POA: Diagnosis not present

## 2017-04-07 DIAGNOSIS — D696 Thrombocytopenia, unspecified: Secondary | ICD-10-CM | POA: Diagnosis not present

## 2017-04-07 DIAGNOSIS — I255 Ischemic cardiomyopathy: Secondary | ICD-10-CM | POA: Diagnosis present

## 2017-04-07 DIAGNOSIS — Z95810 Presence of automatic (implantable) cardiac defibrillator: Secondary | ICD-10-CM | POA: Diagnosis not present

## 2017-04-07 DIAGNOSIS — Z431 Encounter for attention to gastrostomy: Secondary | ICD-10-CM

## 2017-04-07 DIAGNOSIS — Z7189 Other specified counseling: Secondary | ICD-10-CM | POA: Diagnosis not present

## 2017-04-07 DIAGNOSIS — Z7951 Long term (current) use of inhaled steroids: Secondary | ICD-10-CM

## 2017-04-07 DIAGNOSIS — J440 Chronic obstructive pulmonary disease with acute lower respiratory infection: Secondary | ICD-10-CM | POA: Diagnosis present

## 2017-04-07 DIAGNOSIS — E1122 Type 2 diabetes mellitus with diabetic chronic kidney disease: Secondary | ICD-10-CM | POA: Diagnosis present

## 2017-04-07 DIAGNOSIS — E440 Moderate protein-calorie malnutrition: Secondary | ICD-10-CM | POA: Diagnosis not present

## 2017-04-07 DIAGNOSIS — I499 Cardiac arrhythmia, unspecified: Secondary | ICD-10-CM | POA: Diagnosis not present

## 2017-04-07 DIAGNOSIS — E1142 Type 2 diabetes mellitus with diabetic polyneuropathy: Secondary | ICD-10-CM | POA: Diagnosis present

## 2017-04-07 DIAGNOSIS — Z955 Presence of coronary angioplasty implant and graft: Secondary | ICD-10-CM

## 2017-04-07 DIAGNOSIS — I2782 Chronic pulmonary embolism: Secondary | ICD-10-CM | POA: Diagnosis present

## 2017-04-07 DIAGNOSIS — J189 Pneumonia, unspecified organism: Secondary | ICD-10-CM | POA: Diagnosis present

## 2017-04-07 DIAGNOSIS — K5900 Constipation, unspecified: Secondary | ICD-10-CM | POA: Diagnosis present

## 2017-04-07 DIAGNOSIS — I482 Chronic atrial fibrillation, unspecified: Secondary | ICD-10-CM | POA: Diagnosis present

## 2017-04-07 DIAGNOSIS — Y95 Nosocomial condition: Secondary | ICD-10-CM | POA: Diagnosis present

## 2017-04-07 DIAGNOSIS — J9601 Acute respiratory failure with hypoxia: Secondary | ICD-10-CM | POA: Diagnosis present

## 2017-04-07 DIAGNOSIS — D631 Anemia in chronic kidney disease: Secondary | ICD-10-CM | POA: Diagnosis present

## 2017-04-07 DIAGNOSIS — J45909 Unspecified asthma, uncomplicated: Secondary | ICD-10-CM | POA: Diagnosis not present

## 2017-04-07 DIAGNOSIS — E785 Hyperlipidemia, unspecified: Secondary | ICD-10-CM | POA: Diagnosis not present

## 2017-04-07 DIAGNOSIS — Z515 Encounter for palliative care: Secondary | ICD-10-CM | POA: Diagnosis not present

## 2017-04-07 DIAGNOSIS — Z9181 History of falling: Secondary | ICD-10-CM

## 2017-04-07 DIAGNOSIS — K219 Gastro-esophageal reflux disease without esophagitis: Secondary | ICD-10-CM | POA: Diagnosis present

## 2017-04-07 DIAGNOSIS — R1310 Dysphagia, unspecified: Secondary | ICD-10-CM

## 2017-04-07 DIAGNOSIS — J90 Pleural effusion, not elsewhere classified: Secondary | ICD-10-CM | POA: Diagnosis not present

## 2017-04-07 DIAGNOSIS — Z8679 Personal history of other diseases of the circulatory system: Secondary | ICD-10-CM

## 2017-04-07 DIAGNOSIS — M6281 Muscle weakness (generalized): Secondary | ICD-10-CM | POA: Diagnosis present

## 2017-04-07 DIAGNOSIS — R0902 Hypoxemia: Secondary | ICD-10-CM

## 2017-04-07 DIAGNOSIS — E119 Type 2 diabetes mellitus without complications: Secondary | ICD-10-CM | POA: Diagnosis not present

## 2017-04-07 DIAGNOSIS — Z87891 Personal history of nicotine dependence: Secondary | ICD-10-CM

## 2017-04-07 DIAGNOSIS — Z008 Encounter for other general examination: Secondary | ICD-10-CM

## 2017-04-07 DIAGNOSIS — I5022 Chronic systolic (congestive) heart failure: Secondary | ICD-10-CM | POA: Diagnosis present

## 2017-04-07 DIAGNOSIS — N179 Acute kidney failure, unspecified: Secondary | ICD-10-CM | POA: Diagnosis not present

## 2017-04-07 DIAGNOSIS — A419 Sepsis, unspecified organism: Secondary | ICD-10-CM | POA: Diagnosis not present

## 2017-04-07 DIAGNOSIS — N183 Chronic kidney disease, stage 3 unspecified: Secondary | ICD-10-CM | POA: Diagnosis present

## 2017-04-07 DIAGNOSIS — Z66 Do not resuscitate: Secondary | ICD-10-CM | POA: Diagnosis not present

## 2017-04-07 DIAGNOSIS — J1000 Influenza due to other identified influenza virus with unspecified type of pneumonia: Secondary | ICD-10-CM | POA: Diagnosis not present

## 2017-04-07 DIAGNOSIS — J101 Influenza due to other identified influenza virus with other respiratory manifestations: Secondary | ICD-10-CM | POA: Diagnosis present

## 2017-04-07 DIAGNOSIS — G9341 Metabolic encephalopathy: Secondary | ICD-10-CM | POA: Diagnosis present

## 2017-04-07 DIAGNOSIS — R079 Chest pain, unspecified: Secondary | ICD-10-CM | POA: Diagnosis not present

## 2017-04-07 DIAGNOSIS — E876 Hypokalemia: Secondary | ICD-10-CM | POA: Diagnosis not present

## 2017-04-07 DIAGNOSIS — D509 Iron deficiency anemia, unspecified: Secondary | ICD-10-CM | POA: Diagnosis not present

## 2017-04-07 DIAGNOSIS — Z8249 Family history of ischemic heart disease and other diseases of the circulatory system: Secondary | ICD-10-CM

## 2017-04-07 DIAGNOSIS — R1314 Dysphagia, pharyngoesophageal phase: Secondary | ICD-10-CM | POA: Diagnosis present

## 2017-04-07 DIAGNOSIS — R05 Cough: Secondary | ICD-10-CM | POA: Diagnosis not present

## 2017-04-07 DIAGNOSIS — Z885 Allergy status to narcotic agent status: Secondary | ICD-10-CM

## 2017-04-07 DIAGNOSIS — I25119 Atherosclerotic heart disease of native coronary artery with unspecified angina pectoris: Secondary | ICD-10-CM | POA: Diagnosis present

## 2017-04-07 DIAGNOSIS — F0390 Unspecified dementia without behavioral disturbance: Secondary | ICD-10-CM | POA: Diagnosis present

## 2017-04-07 DIAGNOSIS — I2510 Atherosclerotic heart disease of native coronary artery without angina pectoris: Secondary | ICD-10-CM | POA: Diagnosis present

## 2017-04-07 DIAGNOSIS — Z931 Gastrostomy status: Secondary | ICD-10-CM

## 2017-04-07 DIAGNOSIS — Z833 Family history of diabetes mellitus: Secondary | ICD-10-CM

## 2017-04-07 DIAGNOSIS — Z888 Allergy status to other drugs, medicaments and biological substances status: Secondary | ICD-10-CM

## 2017-04-07 DIAGNOSIS — I252 Old myocardial infarction: Secondary | ICD-10-CM

## 2017-04-07 DIAGNOSIS — R627 Adult failure to thrive: Secondary | ICD-10-CM | POA: Diagnosis not present

## 2017-04-07 DIAGNOSIS — Z951 Presence of aortocoronary bypass graft: Secondary | ICD-10-CM

## 2017-04-07 DIAGNOSIS — I110 Hypertensive heart disease with heart failure: Secondary | ICD-10-CM | POA: Diagnosis not present

## 2017-04-07 DIAGNOSIS — M150 Primary generalized (osteo)arthritis: Secondary | ICD-10-CM | POA: Diagnosis not present

## 2017-04-07 DIAGNOSIS — Z6821 Body mass index (BMI) 21.0-21.9, adult: Secondary | ICD-10-CM

## 2017-04-07 DIAGNOSIS — Z748 Other problems related to care provider dependency: Secondary | ICD-10-CM | POA: Diagnosis not present

## 2017-04-07 LAB — CBC WITH DIFFERENTIAL/PLATELET
Basophils Absolute: 0 10*3/uL (ref 0.0–0.1)
Basophils Relative: 0 %
Eosinophils Absolute: 0 10*3/uL (ref 0.0–0.7)
Eosinophils Relative: 0 %
HEMATOCRIT: 41.9 % (ref 39.0–52.0)
Hemoglobin: 13.4 g/dL (ref 13.0–17.0)
LYMPHS ABS: 2.3 10*3/uL (ref 0.7–4.0)
Lymphocytes Relative: 24 %
MCH: 30.1 pg (ref 26.0–34.0)
MCHC: 32 g/dL (ref 30.0–36.0)
MCV: 94.2 fL (ref 78.0–100.0)
MONO ABS: 1.8 10*3/uL — AB (ref 0.1–1.0)
Monocytes Relative: 19 %
NEUTROS ABS: 5.4 10*3/uL (ref 1.7–7.7)
Neutrophils Relative %: 57 %
PLATELETS: 101 10*3/uL — AB (ref 150–400)
RBC: 4.45 MIL/uL (ref 4.22–5.81)
RDW: 16.6 % — ABNORMAL HIGH (ref 11.5–15.5)
WBC: 9.5 10*3/uL (ref 4.0–10.5)

## 2017-04-07 LAB — BRAIN NATRIURETIC PEPTIDE: B Natriuretic Peptide: 241.6 pg/mL — ABNORMAL HIGH (ref 0.0–100.0)

## 2017-04-07 LAB — URINALYSIS, ROUTINE W REFLEX MICROSCOPIC
BILIRUBIN URINE: NEGATIVE
Glucose, UA: NEGATIVE mg/dL
KETONES UR: 20 mg/dL — AB
LEUKOCYTES UA: NEGATIVE
Nitrite: NEGATIVE
PROTEIN: 100 mg/dL — AB
SQUAMOUS EPITHELIAL / LPF: NONE SEEN
Specific Gravity, Urine: 1.028 (ref 1.005–1.030)
pH: 5 (ref 5.0–8.0)

## 2017-04-07 LAB — LACTIC ACID, PLASMA: LACTIC ACID, VENOUS: 1.3 mmol/L (ref 0.5–1.9)

## 2017-04-07 LAB — COMPREHENSIVE METABOLIC PANEL
ALBUMIN: 3.2 g/dL — AB (ref 3.5–5.0)
ALT: 16 U/L — ABNORMAL LOW (ref 17–63)
ANION GAP: 17 — AB (ref 5–15)
AST: 33 U/L (ref 15–41)
Alkaline Phosphatase: 50 U/L (ref 38–126)
BUN: 18 mg/dL (ref 6–20)
CHLORIDE: 100 mmol/L — AB (ref 101–111)
CO2: 19 mmol/L — ABNORMAL LOW (ref 22–32)
Calcium: 8.2 mg/dL — ABNORMAL LOW (ref 8.9–10.3)
Creatinine, Ser: 1.28 mg/dL — ABNORMAL HIGH (ref 0.61–1.24)
GFR calc Af Amer: 58 mL/min — ABNORMAL LOW (ref >60)
GFR calc non Af Amer: 50 mL/min — ABNORMAL LOW (ref >60)
GLUCOSE: 84 mg/dL (ref 65–99)
Potassium: 4 mmol/L (ref 3.5–5.1)
Sodium: 136 mmol/L (ref 135–145)
Total Bilirubin: 2.5 mg/dL — ABNORMAL HIGH (ref 0.3–1.2)
Total Protein: 6.2 g/dL — ABNORMAL LOW (ref 6.5–8.1)

## 2017-04-07 LAB — I-STAT TROPONIN, ED: Troponin i, poc: 0.04 ng/mL (ref 0.00–0.08)

## 2017-04-07 LAB — INFLUENZA PANEL BY PCR (TYPE A & B)
INFLAPCR: POSITIVE — AB
Influenza B By PCR: NEGATIVE

## 2017-04-07 LAB — I-STAT CG4 LACTIC ACID, ED: LACTIC ACID, VENOUS: 1.81 mmol/L (ref 0.5–1.9)

## 2017-04-07 LAB — STREP PNEUMONIAE URINARY ANTIGEN: Strep Pneumo Urinary Antigen: NEGATIVE

## 2017-04-07 LAB — DIGOXIN LEVEL: DIGOXIN LVL: 0.7 ng/mL — AB (ref 0.8–2.0)

## 2017-04-07 LAB — HIV ANTIBODY (ROUTINE TESTING W REFLEX): HIV Screen 4th Generation wRfx: NONREACTIVE

## 2017-04-07 LAB — PROCALCITONIN: Procalcitonin: 0.25 ng/mL

## 2017-04-07 MED ORDER — VANCOMYCIN HCL IN DEXTROSE 1-5 GM/200ML-% IV SOLN: 1000.0000 mg | Freq: Once | INTRAVENOUS | Status: DC

## 2017-04-07 MED ORDER — ACETAMINOPHEN 325 MG PO TABS
650.0000 mg | ORAL_TABLET | Freq: Once | ORAL | Status: AC
  Administered 2017-04-07: 650 mg via ORAL
  Filled 2017-04-07: qty 2

## 2017-04-07 MED ORDER — ENOXAPARIN SODIUM 40 MG/0.4ML ~~LOC~~ SOLN: 40.0000 mg | SUBCUTANEOUS | Status: DC

## 2017-04-07 MED ORDER — FLUTICASONE PROPIONATE 50 MCG/ACT NA SUSP: 1.0000 | Freq: Every day | NASAL | Status: DC | PRN

## 2017-04-07 MED ORDER — LORATADINE 10 MG PO TABS: 10.0000 mg | ORAL_TABLET | Freq: Every day | ORAL | Status: DC | PRN

## 2017-04-07 MED ORDER — OSELTAMIVIR PHOSPHATE 30 MG PO CAPS
30.0000 mg | ORAL_CAPSULE | Freq: Two times a day (BID) | ORAL | Status: DC
  Administered 2017-04-07 – 2017-04-09 (×6): 30 mg via ORAL
  Filled 2017-04-07 (×6): qty 1

## 2017-04-07 MED ORDER — CALCIUM CARBONATE ANTACID 500 MG PO CHEW: 500.0000 mg | CHEWABLE_TABLET | Freq: Every day | ORAL | Status: DC | PRN

## 2017-04-07 MED ORDER — METOPROLOL SUCCINATE ER 25 MG PO TB24
25.0000 mg | ORAL_TABLET | Freq: Every day | ORAL | Status: DC
  Administered 2017-04-07 – 2017-04-14 (×5): 25 mg via ORAL
  Filled 2017-04-07 (×6): qty 1

## 2017-04-07 MED ORDER — SODIUM CHLORIDE 0.9 % IV SOLN
1.0000 g | INTRAVENOUS | Status: DC
  Administered 2017-04-08: 1 g via INTRAVENOUS
  Filled 2017-04-07: qty 1

## 2017-04-07 MED ORDER — SODIUM CHLORIDE 0.9 % IV BOLUS (SEPSIS)
1000.0000 mL | Freq: Once | INTRAVENOUS | Status: AC
  Administered 2017-04-07: 1000 mL via INTRAVENOUS

## 2017-04-07 MED ORDER — ACETAMINOPHEN 325 MG PO TABS: 650.0000 mg | ORAL_TABLET | Freq: Four times a day (QID) | ORAL | Status: DC | PRN

## 2017-04-07 MED ORDER — VANCOMYCIN HCL IN DEXTROSE 750-5 MG/150ML-% IV SOLN
750.0000 mg | Freq: Two times a day (BID) | INTRAVENOUS | Status: DC
  Administered 2017-04-07 – 2017-04-08 (×3): 750 mg via INTRAVENOUS
  Filled 2017-04-07 (×4): qty 150

## 2017-04-07 MED ORDER — VANCOMYCIN HCL 10 G IV SOLR
1500.0000 mg | Freq: Once | INTRAVENOUS | Status: AC
  Administered 2017-04-07: 1500 mg via INTRAVENOUS
  Filled 2017-04-07: qty 1500

## 2017-04-07 MED ORDER — SODIUM CHLORIDE 0.9 % IV SOLN
2.0000 g | Freq: Once | INTRAVENOUS | Status: AC
  Administered 2017-04-07: 2 g via INTRAVENOUS
  Filled 2017-04-07: qty 2

## 2017-04-07 MED ORDER — PHENOL 1.4 % MT LIQD
1.0000 | OROMUCOSAL | Status: DC | PRN
  Administered 2017-04-15 – 2017-04-18 (×2): 1 via OROMUCOSAL
  Filled 2017-04-07: qty 177

## 2017-04-07 MED ORDER — TRAMADOL HCL 50 MG PO TABS
50.0000 mg | ORAL_TABLET | Freq: Three times a day (TID) | ORAL | Status: DC | PRN
  Administered 2017-04-07 – 2017-04-08 (×3): 50 mg via ORAL
  Filled 2017-04-07 (×3): qty 1

## 2017-04-07 MED ORDER — DIGOXIN 125 MCG PO TABS
0.1250 mg | ORAL_TABLET | Freq: Every day | ORAL | Status: DC
  Administered 2017-04-07 – 2017-04-26 (×15): 0.125 mg via ORAL
  Filled 2017-04-07 (×18): qty 1

## 2017-04-07 MED ORDER — NITROGLYCERIN 0.4 MG SL SUBL
0.4000 mg | SUBLINGUAL_TABLET | SUBLINGUAL | Status: DC | PRN
  Administered 2017-04-17 (×3): 0.4 mg via SUBLINGUAL
  Filled 2017-04-07: qty 1

## 2017-04-07 MED ORDER — GABAPENTIN 100 MG PO CAPS: 100.0000 mg | ORAL_CAPSULE | Freq: Every evening | ORAL | Status: DC | PRN

## 2017-04-07 MED ORDER — SODIUM CHLORIDE 0.9 % IV SOLN
INTRAVENOUS | Status: DC
  Administered 2017-04-07 – 2017-04-08 (×2): via INTRAVENOUS

## 2017-04-07 MED ORDER — DM-GUAIFENESIN ER 30-600 MG PO TB12
1.0000 | ORAL_TABLET | Freq: Two times a day (BID) | ORAL | Status: DC | PRN
  Administered 2017-04-07 – 2017-04-08 (×3): 1 via ORAL
  Filled 2017-04-07 (×3): qty 1

## 2017-04-07 MED ORDER — ALBUTEROL SULFATE (2.5 MG/3ML) 0.083% IN NEBU
2.5000 mg | INHALATION_SOLUTION | RESPIRATORY_TRACT | Status: DC | PRN
  Administered 2017-04-07: 2.5 mg via RESPIRATORY_TRACT
  Filled 2017-04-07: qty 3

## 2017-04-07 MED ORDER — OSELTAMIVIR PHOSPHATE 75 MG PO CAPS: 75.0000 mg | ORAL_CAPSULE | Freq: Two times a day (BID) | ORAL | Status: DC

## 2017-04-07 NOTE — Progress Notes (Signed)
[name] is a 84 y.o. male with medical history significant of afibnot on anticoagulation due to prior subdural hemorrhage, PE, hypertension, hyperlipidemia, diet-controlled diabetes, asthma, GERD, SDH, CAD, s/p of CABG and stent placement, sCHF with EF 35%, AICD placement, who presents with cough, chest pain and fever.  T-max 102.1.  Right lower lobe infiltrates. Admitted for HCAP complicated by influenza A infection.  04/07/2017: Patient was seen and examined at his bedside.  Has no new complaints.  Please refer to H&P dictated by Dr. Blaine Hamper on 04/07/2017 for further details of the assessment and plan.

## 2017-04-07 NOTE — ED Notes (Signed)
Patient transported to X-ray 

## 2017-04-07 NOTE — ED Triage Notes (Signed)
Pt BIB [facility] for c/o chest pain and shortness of breath. CP is in the middle of his chest, non radiating, worsens with movement. EMS reports rhonchi. 20G L wrist  Vitals PTA: Temp 101.2  HR 110 afib (hx of same) BP118/66 SPO2 96% RA RR 20

## 2017-04-07 NOTE — Care Management Note (Signed)
Case Management Note  Patient Details  Name: [name] MRN: [id number] Date of Birth: Aug 19, 1932  Subjective/Objective:    Influenza A               Action/Plan: 04/07/2017- Noted patient recently discharged; is active with [facility] for their Home First Program; CM will continue to follow for progression of care; B Pennie Rushing   04/02/2017 - Subjective/Objective:              Patient DC'd from the hospital 5 days ago to home w [facility] for [facility] services. Patient lives at home alone, is checked on by neighbors and has frequent falls. Patient with 5 admissions in the past 6 months with 1 stay at [facility] during this time. Patient not eligible for ST SNF at this time due to OBS and not appropriate for Inpatient status at this time. Patient would be a good candidate for Home First with [facility]. D Swist RN CM  Expected Discharge Date:  Possibly 04/11/2017             Expected Discharge Plan:  [place]  In-House Referral:   [facility]  Discharge planning Services  CM Consult  HH Arranged:  RN, Disease Management, PT, OT, Nurse's Aide [place] Agency:  [place]  Status of Service:  In process, will continue to follow  Sherrilyn Rist [phone] 04/07/2017, 3:56 PM

## 2017-04-07 NOTE — Consult Note (Signed)
   [facility] CM Inpatient Consult   04/07/2017  Aviraj Kentner [facility] 1932/06/19 [id number]   Chart reviewed for re-admission.  Patient re-admitted with Influenza A and with HCAP.   Patient is in the [place] [facility] ACO and recently active with [facility] First program. Made inpatient aware that the patient is active in the external program.   Anticipate patient to return home with [place].  Will monitor as appropriate for post hospital follow up needs.  Primary Care Provider is Dr. Alysia Penna, with [place] at [facility] office.  This office is listed to provide the transition of care calls and follow up.  [name], RN BSN [facility] Liaison  [phone] business mobile phone Toll free office [phone]

## 2017-04-07 NOTE — Progress Notes (Signed)
Pharmacy Antibiotic Note  [name] is a 84 y.o. male admitted on 04/07/2017 with cough and chest discomfort. Starting empiric antibiotics for pneumonia. Pt is also flu positive - tamiflu has been started. SCr 1 > 1.28.   Plan: -Vancomycin 1500 mg IV x1 then 750 mg IV q12h -Cefepime 1 g IV q24h -Monitor renal fx, cultures, VT as needed   Height: 6' (182.9 cm) Weight: 169 lb (76.7 kg) IBW/kg (Calculated) : 77.6  Temp (24hrs), Avg:102.1 F (38.9 C), Min:102.1 F (38.9 C), Max:102.1 F (38.9 C)  Recent Labs  Lab 04/01/17 1209 04/01/17 1424 04/02/17 0656 04/03/17 0836 04/04/17 0449 04/07/17 0236 04/07/17 0249  WBC  --  9.1 6.0  --   --  9.5  --   CREATININE 1.88*  --  1.36* 1.13 1.00 1.28*  --   LATICACIDVEN  --   --   --   --   --   --  1.81    Estimated Creatinine Clearance: 48.3 mL/min (A) (by C-G formula based on SCr of 1.28 mg/dL (H)).    Allergies  Allergen Reactions  . Tamsulosin Other (See Comments)    Dizziness, Made BP very low and weakness  . Celebrex [Celecoxib] Hives and Nausea And Vomiting    Gi upset  . Dronedarone Nausea And Vomiting and Other (See Comments)    GI upset, abdominal pain  . Esomeprazole Magnesium Hives and Other (See Comments)    "don't really remember"  . Digoxin Diarrhea    May have caused some diarrhea  . Hydrocodone-Acetaminophen Other (See Comments)    Bad headache  . Protonix [Pantoprazole Sodium] Nausea And Vomiting and Other (See Comments)    Tolerates Dexilant    Antimicrobials this admission: 3/8 vancomycin > 3/8 cefepime >  Dose adjustments this admission: N/A  Microbiology results: 3/8 blood cx: 3/8 resp cx:   Harvel Quale 04/07/2017 6:31 AM

## 2017-04-07 NOTE — H&P (Signed)
History and Physical    [name]:[id number] DOB: 1932/05/31 DOA: 04/07/2017  Referring MD/NP/PA:   PCP: [name], MD   Patient coming from:  The patient is coming from home.  At baseline, pt is independent for most of ADL.   Chief Complaint: cough, chest pain and fever  HPI: [name] is a 84 y.o. male with medical history significant of afib not on anticoagulation due to prior subdural hemorrhage, PE, hypertension, hyperlipidemia, diet-controlled diabetes, asthma, GERD, SDH, CAD, s/p of CABG and stent placement, sCHF with EF 35%, AICD placement, who presents with cough, chest pain or fever.  Patient states that he started having cough and chest pain yesterday. The chest pain is located in the central chest, mild, sharp, nonradiating, currently chest has resolved. He coughs up a little mucus. He denies fever, but his temperature is 102.1 in ED. No chills. No runny nose or sore throat. Patient denies nausea, vomiting, diarrhea, abdominal pain, symptoms of UTI or unilateral weakness.  ED Course: pt was found to have positive Flu A PCR, WBC 9.5, lactic acid 1.81, negative troponin, pending urinalysis, stable renal function, tachycardia, tachypnea, oxygen saturation section 83% on room air, temperature 102.1, chest x-ray showed possible patch infiltration in right base. Patient is admitted to telemetry bed as inpatient.  Review of Systems:   General: has fevers, no chills, no body weight gain, has poor appetite, has fatigue HEENT: no blurry vision, hearing changes or sore throat Respiratory: no dyspnea, has coughing, no wheezing CV: has chest pain, no palpitations GI: no nausea, vomiting, abdominal pain, diarrhea, constipation GU: no dysuria, burning on urination, increased urinary frequency, hematuria  Ext: no leg edema Neuro: no unilateral weakness, numbness, or tingling, no vision change or hearing loss Skin: no rash, no skin tear. MSK: No muscle spasm, no  deformity, no limitation of range of movement in spin Heme: No easy bruising.  Travel history: No recent long distant travel.  Allergy:  Allergies  Allergen Reactions  . Tamsulosin Other (See Comments)    Dizziness, Made BP very low and weakness  . Celebrex [Celecoxib] Hives and Nausea And Vomiting    Gi upset  . Dronedarone Nausea And Vomiting and Other (See Comments)    GI upset, abdominal pain  . Esomeprazole Magnesium Hives and Other (See Comments)    "don't really remember"  . Digoxin Diarrhea    May have caused some diarrhea  . Hydrocodone-Acetaminophen Other (See Comments)    Bad headache  . Protonix [Pantoprazole Sodium] Nausea And Vomiting and Other (See Comments)    Tolerates Dexilant    Past Medical History:  Diagnosis Date  . Abnormal thyroid scan    Abnormal thyroid imaging studies from 11/09/2010, status post ultrasound guided fine needle aspiration of the dominant left inferior thyroid nodule on 12/15/2010. Cytology report showed rare follicular epithelial cells and hemosiderin laden macrophages.  Marland Kitchen AICD (automatic cardioverter/defibrillator) present    a. fx lead; a. s/p lead extraction 03/02/17  . Arthritis    "all over"  . Asthma   . Atrial fibrillation ([place])    on chronic Coumadin; stopped July 2013 due to subdural hematomas  . CHF (congestive heart failure) (HCC)    EF 35-40% s/p most recent ICD generator change-out with Medtronic dual-chamber ICD 05/20/11 with explantation of previous abdominally-implanted device  . Coronary artery disease    s/p CABG 1983 and PCI/stent 2004.   . Diabetes mellitus    diet controlled  . Dyslipidemia   .  Erythrocytosis   . GERD (gastroesophageal reflux disease)   . Hypertension   . Ischemic cardiomyopathy    WITH CHF  . Monocytosis 04/17/2013  . Myocardial infarction ([place]) 1983; ~ 1990  . Pneumonia August 2013  . Subdural hematoma [facility]) July 2013   Anticoagulation stopped.   . SunDown syndrome   . VT (ventricular  tachycardia) ([place])     Past Surgical History:  Procedure Laterality Date  . CHOLECYSTECTOMY    . COLONOSCOPY  07/07/2011   Procedure: COLONOSCOPY;  Surgeon: Jerene Bears, MD;  Location: [facility];  Service: Gastroenterology;  Laterality: N/A;  . CORONARY ANGIOPLASTY WITH STENT PLACEMENT  2004   Tandem Cypher stents LAD  . CORONARY ARTERY BYPASS GRAFT  1983   SVG-mLAD  . ESOPHAGOGASTRODUODENOSCOPY  02/11/2011   Procedure: ESOPHAGOGASTRODUODENOSCOPY (EGD);  Surgeon: Beryle Beams, MD;  Location: [facility];  Service: Endoscopy;  Laterality: N/A;  . FLEXIBLE SIGMOIDOSCOPY N/A 02/08/2017   Procedure: FLEXIBLE SIGMOIDOSCOPY;  Surgeon: Irene Shipper, MD;  Location: [facility];  Service: Endoscopy;  Laterality: N/A;  . ICD GENERATOR CHANGEOUT N/A 04/08/2016   Procedure: ICD Generator Changeout;  Surgeon: Evans Lance, MD;  Location: [place] [facility];  Service: Cardiovascular;  Laterality: N/A;  . ICD LEAD REMOVAL N/A 03/02/2017   Procedure: ICD LEAD REMOVAL;  Surgeon: Evans Lance, MD;  Location: [place];  Service: Cardiovascular;  Laterality: N/A;  . IMPLANTABLE CARDIOVERTER DEFIBRILLATOR (ICD) GENERATOR CHANGE N/A 05/20/2011   Procedure: ICD GENERATOR CHANGE;  Surgeon: Evans Lance, MD;  Medtronic secure dual-chamber ICD serial number [id number]   . KNEE ARTHROSCOPY     right; "just went in and scraped it"  . LEAD INSERTION N/A 03/02/2017   Procedure: LEAD INSERTION;  Surgeon: Evans Lance, MD;  Location: [place];  Service: Cardiovascular;  Laterality: N/A;  . MASS EXCISION Right 05/10/2013   Procedure: EXCISION MASS RIGHT THUMB;  Surgeon: Wynonia Sours, MD;  Location: [place];  Service: Orthopedics;  Laterality: Right;  . PROXIMAL INTERPHALANGEAL FUSION (PIP) Right 05/10/2013   Procedure: DEBRIDEMENT PROXIMAL INTERPHALANGEAL FUSION (PIP);  Surgeon: Wynonia Sours, MD;  Location: [place];  Service: Orthopedics;  Laterality: Right;  . TONSILLECTOMY            Social History:  reports that he quit smoking about 40 years ago. His smoking use included cigarettes. He has a 60.00 pack-year smoking history. he has never used smokeless tobacco. He reports that he does not drink alcohol or use drugs.  Family History:  Family History  Problem Relation Age of Onset  . Tuberculosis Mother   . Tuberculosis Father   . Heart disease Brother   . Diabetes Sister   . Diabetes Brother   . Clotting disorder Brother      Prior to Admission medications   Medication Sig Start Date End Date Taking? Authorizing Provider  acetaminophen (TYLENOL) 325 MG tablet Take 2 tablets (650 mg total) by mouth every 6 (six) hours as needed for mild pain (or Fever >/= 101). 04/03/17  Yes Georgette Shell, MD  ADVAIR DISKUS 250-50 MCG/DOSE AEPB Inhale 1 puff into the lungs 2 (two) times daily.  11/13/14  Yes [provider]  calcium carbonate (TUMS - DOSED IN MG ELEMENTAL CALCIUM) 500 MG chewable tablet Chew 500 mg by mouth daily as needed for heartburn.    Yes [provider]  digoxin (LANOXIN) 0.125 MG tablet Take 1 tablet (0.125 mg total) by  mouth daily. 02/10/17  Yes Short, Noah Delaine, MD  fluticasone (FLONASE) 50 MCG/ACT nasal spray Place 2 sprays into both nostrils daily. Patient taking differently: Place 1 spray into both nostrils daily as needed for allergies.  01/04/17  Yes [name], MD  furosemide (LASIX) 40 MG tablet Take 0.5 tablets (20 mg total) by mouth daily. May take an extra tab daily as needed for swelling 04/03/17  Yes Georgette Shell, MD  gabapentin (NEURONTIN) 100 MG capsule TAKE 1 CAPSULE (100MG ) TO 3 CAPSULES (300MG ) EACH NIGHT AS NEEDED FOR MUSCLE PAINS Patient taking differently: Take 100-300 mg by mouth at bedtime as needed (for mucle pains).  10/05/16  Yes [name], MD  lisinopril (PRINIVIL,ZESTRIL) 10 MG tablet Take 0.5 tablets (5 mg total) by mouth daily. 04/03/17  Yes Georgette Shell, MD  loratadine (CLARITIN) 10 MG  tablet Take 10 mg by mouth daily as needed for allergies.    Yes [provider]  metoprolol succinate (TOPROL-XL) 25 MG 24 hr tablet Take 1 tablet (25 mg total) by mouth daily. 02/10/17  Yes Short, Noah Delaine, MD  nitroGLYCERIN (NITROSTAT) 0.4 MG SL tablet PLACE ONE UNDER TONGUE FOR CHEST PAIN. Patient taking differently: Place 0.4 mg under tongue as needed for chest pain 11/22/16  Yes [name], Peter M, MD  phenol (CHLORASEPTIC) 1.4 % LIQD Use as directed 1 spray in the mouth or throat as needed for throat irritation / pain. 04/04/17  Yes Georgette Shell, MD  potassium chloride (K-DUR,KLOR-CON) 10 MEQ tablet Take 1 tablet (10 mEq total) by mouth daily. 01/09/13  Yes [name], Peter M, MD  traMADol (ULTRAM) 50 MG tablet TAKE 2 TABLETS BY MOUTH EVERY 8 HOURS AS NEEDED FOR PAIN Patient taking differently: Take 100 mg by mouth every 8 (eight) hours as needed for moderate pain.  03/24/17  Yes [name], MD  RESTASIS 0.05 % ophthalmic emulsion INSTILL 1 DROP INTO BOTH EYES TWICE A DAY Patient not taking: Reported on 04/01/2017 11/17/16   [name], MD    Physical Exam: Vitals:   04/07/17 0530 04/07/17 0545 04/07/17 0615 04/07/17 0630  BP: (!) 103/57 117/69 113/70 115/64  Pulse: 80 93 (!) 105 (!) 106  Resp: (!) 26 (!) 21 (!) 23 (!) 25  Temp:      TempSrc:      SpO2: 94% 98% 98% 99%  Weight:      Height:       General: Not in acute distress HEENT:       Eyes: PERRL, EOMI, no scleral icterus.       ENT: No discharge from the ears and nose, no pharynx injection, no tonsillar enlargement.        Neck: No JVD, no bruit, no mass felt. Heme: No neck lymph node enlargement. Cardiac: S1/S2, RRR, No murmurs, No gallops or rubs. Respiratory: No rales, wheezing, rhonchi or rubs. GI: Soft, nondistended, nontender, no rebound pain, no organomegaly, BS present. GU: No hematuria Ext: No pitting leg edema bilaterally. 2+DP/PT pulse bilaterally. Musculoskeletal: No joint deformities, No  joint redness or warmth, no limitation of ROM in spin. Skin: No rashes.  Neuro: Alert, oriented X3, cranial nerves II-XII grossly intact, moves all extremities normally Psych: Patient is not psychotic, no suicidal or hemocidal ideation.  Labs on Admission: I have personally reviewed following labs and imaging studies  CBC: Recent Labs  Lab 04/01/17 1424 04/02/17 0656 04/07/17 0236  WBC 9.1 6.0 9.5  NEUTROABS 5.1  --  5.4  HGB 14.1 12.5* 13.4  HCT 42.7 38.5* 41.9  MCV 92.6 93.7 94.2  PLT 113* 100* 563*   Basic Metabolic Panel: Recent Labs  Lab 04/01/17 1209 04/02/17 0656 04/03/17 0836 04/04/17 0449 04/07/17 0236  NA 138 140 139 140 136  K 3.6 3.2* 3.8 3.5 4.0  CL 101 106 104 107 100*  CO2 22 23 23 22  19*  GLUCOSE 133* 95 115* 82 84  BUN 31* 24* 20 15 18   CREATININE 1.88* 1.36* 1.13 1.00 1.28*  CALCIUM 9.0 8.6* 8.9 8.5* 8.2*   GFR: Estimated Creatinine Clearance: 48.3 mL/min (A) (by C-G formula based on SCr of 1.28 mg/dL (H)). Liver Function Tests: Recent Labs  Lab 04/01/17 1209 04/03/17 0836 04/04/17 0449 04/07/17 0236  AST 31 35  33 24 33  ALT 11* 11*  13* 11* 16*  ALKPHOS 55 55  49 46 50  BILITOT 2.6* 2.6*  2.7* 1.6* 2.5*  PROT 6.5 6.4*  6.3* 5.9* 6.2*  ALBUMIN 3.7 3.6  3.4* 3.1* 3.2*   No results for input(s): LIPASE, AMYLASE in the last 168 hours. No results for input(s): AMMONIA in the last 168 hours. Coagulation Profile: No results for input(s): INR, PROTIME in the last 168 hours. Cardiac Enzymes: Recent Labs  Lab 04/01/17 1209 04/01/17 2101  CKTOTAL 262  --   TROPONINI  --  0.06*   BNP (last 3 results) No results for input(s): PROBNP in the last 8760 hours. HbA1C: No results for input(s): HGBA1C in the last 72 hours. CBG: No results for input(s): GLUCAP in the last 168 hours. Lipid Profile: No results for input(s): CHOL, HDL, LDLCALC, TRIG, CHOLHDL, LDLDIRECT in the last 72 hours. Thyroid Function Tests: No results for input(s):  TSH, T4TOTAL, FREET4, T3FREE, THYROIDAB in the last 72 hours. Anemia Panel: No results for input(s): VITAMINB12, FOLATE, FERRITIN, TIBC, IRON, RETICCTPCT in the last 72 hours. Urine analysis:    Component Value Date/Time   COLORURINE AMBER (A) 04/01/2017 2046   APPEARANCEUR CLOUDY (A) 04/01/2017 2046   LABSPEC 1.025 04/01/2017 2046   PHURINE 5.0 04/01/2017 2046   GLUCOSEU NEGATIVE 04/01/2017 2046   HGBUR LARGE (A) 04/01/2017 2046   BILIRUBINUR NEGATIVE 04/01/2017 2046   KETONESUR 5 (A) 04/01/2017 2046   PROTEINUR 100 (A) 04/01/2017 2046   UROBILINOGEN 0.2 09/09/2012 1501   NITRITE NEGATIVE 04/01/2017 2046   LEUKOCYTESUR TRACE (A) 04/01/2017 2046   Sepsis Labs: @LABRCNTIP (procalcitonin:4,lacticidven:4) ) Recent Results (from the past 240 hour(s))  MRSA PCR Screening     Status: None   Collection Time: 04/04/17  1:21 AM  Result Value Ref Range Status   MRSA by PCR NEGATIVE NEGATIVE Final    Comment:        The GeneXpert MRSA Assay (FDA approved for NASAL specimens only), is one component of a comprehensive MRSA colonization surveillance program. It is not intended to diagnose MRSA infection nor to guide or monitor treatment for MRSA infections. Performed at [facility], [place] [address] [place], [place] [address]      Radiological Exams on Admission: Dg Chest 2 View  Result Date: 04/07/2017 CLINICAL DATA:  Fever and cough EXAM: CHEST - 2 VIEW COMPARISON:  04/01/2017 CT chest 03/26/2017, radiographs 03/25/2017, 03/03/2017, 12/23/2016 FINDINGS: Post sternotomy changes. Left-sided pacing device as before. Borderline to mild cardiomegaly. Aortic atherosclerosis. Patchy infiltrate at the right base. No pneumothorax. Degenerative changes of the spine. IMPRESSION: Suspected patchy infiltrate at the right base, short interval radiographic follow-up suggested to ensure clearing. Cardiomegaly. Electronically  Signed   By: Donavan Foil M.D.   On: 04/07/2017 03:33     EKG:  Independently reviewed.  Not done in ED, will get one.   Assessment/Plan Principal Problem:   Influenza A Active Problems:   Chronic atrial fibrillation (HCC)   Chronic systolic heart failure- EF 35-40%   CAD (coronary artery disease)   HCAP (healthcare-associated pneumonia)   Thrombocytopenia (HCC)   ICD (implantable cardioverter-defibrillator) in place   Sepsis ([place])   CKD (chronic kidney disease), stage III ([place])   Acute respiratory failure with hypoxia (HCC)  Acute respiratory failure with hypoxia due to Flu A and sepsis: Patient's symptoms are most likely due to Flu. Patient meets criteria for sepsis with fever, tachycardia and tachypnea. Chest x-ray showed possible patchy infiltration in right base, but cannot completely rule out HCAP. ABx was started in ED, Will continue Abx until sepsis is controlled. Currently hemodynamically stable. Lactic acid is normal.  - will admit to tele bed as inpt - Tamiflu - continue IV Vancomycin and cefepime - Mucinex for cough  - prn Albuterol Nebs for SOB - Urine legionella and S. pneumococcal antigen - Follow up blood culture x2, sputum culture - will get Procalcitonin and trend lactic acid level per sepsis protocol - IVF: 1L of NS bolus in ED, followed by 75 mL per hour of NS  Chronic atrial fibrillation ([place]): CHA2DS2-VASc Score is 6, needs oral anticoagulation, but pt is not on AC due to hx of SDH. Heart rate is 90-120. -continue metoprolol and digoxin (digoxin level 0.7)  Chronic systolic heart failure- EF 35-40%: The patient does not have leg edema JVD. CHF is compensated. -Check BNP -Hold Lasix due to sepsis.  CAD: s/p of CABG and stent placement. He has mild CP which has resolved, likely due to pneumonia. Trop negative. -prn NTG -continue metoprolol  Thrombocytopenia ([place]): this is a chronic issue. Patient is a chronic issue. No bleeding tendency -Follow-up CBC  CKD (chronic kidney disease), stage III ([place]): Stable. Baseline  creatinine 1.0-1.4. His creatinine is 1.28, BUN 18. -Follow-up renal function by BMP   Chronic pulmonary embolism:  Chart review revealed that pt likely has chronic PE, and is a poor candidate for anticoagulation. Recent LE doppler was negative for PE. Today his symptoms are most likely due to Flu A and possible pneumonia. -observe   DVT ppx: SCD Code Status: Full code Family Communication: None at bed side.   Disposition Plan:  Anticipate discharge back to previous home environment Consults called:  none Admission status:   Inpatient/tele     Date of Service 04/07/2017    Ivor Costa [facility] Pager [phone]  If 7PM-7AM, please contact night-coverage [email or web address] Password [facility] 04/07/2017, 6:52 AM

## 2017-04-07 NOTE — ED Provider Notes (Addendum)
[place] EMERGENCY DEPARTMENT Provider Note   CSN: [id number] Arrival date & time: 04/07/17  0201     History   Chief Complaint Chief Complaint  Patient presents with  . Chest Pain  . Shortness of Breath    HPI [name] is a 84 y.o. male.  Patient presents with chest pain.  Patient reports that yesterday afternoon he started having discomfort in the center of his chest that was accompanied by cough.  Patient reports mild shortness of breath.  He has not had abdominal pain, nausea, vomiting, diarrhea.  He is unaware if he has had a fever.     Past Medical History:  Diagnosis Date  . Abnormal thyroid scan    Abnormal thyroid imaging studies from 11/09/2010, status post ultrasound guided fine needle aspiration of the dominant left inferior thyroid nodule on 12/15/2010. Cytology report showed rare follicular epithelial cells and hemosiderin laden macrophages.  Marland Kitchen AICD (automatic cardioverter/defibrillator) present    a. fx lead; a. s/p lead extraction 03/02/17  . Arthritis    "all over"  . Asthma   . Atrial fibrillation ([place])    on chronic Coumadin; stopped July 2013 due to subdural hematomas  . CHF (congestive heart failure) (HCC)    EF 35-40% s/p most recent ICD generator change-out with Medtronic dual-chamber ICD 05/20/11 with explantation of previous abdominally-implanted device  . Coronary artery disease    s/p CABG 1983 and PCI/stent 2004.   . Diabetes mellitus    diet controlled  . Dyslipidemia   . Erythrocytosis   . GERD (gastroesophageal reflux disease)   . Hypertension   . Ischemic cardiomyopathy    WITH CHF  . Monocytosis 04/17/2013  . Myocardial infarction ([place]) 1983; ~ 1990  . Pneumonia August 2013  . Subdural hematoma [facility]) July 2013   Anticoagulation stopped.   . SunDown syndrome   . VT (ventricular tachycardia) [facility])     Patient Active Problem List   Diagnosis Date Noted  . Sepsis ([place]) 04/07/2017  . Influenza A  04/07/2017  . CKD (chronic kidney disease), stage III ([place]) 04/07/2017  . Syncope 04/01/2017  . Acute kidney injury ([place]) 04/01/2017  . Thyroid nodule 04/01/2017  . Chronic pulmonary embolism ([place]) 03/27/2017  . Acute confusion 03/04/2017  . Failure of implantable cardioverter-defibrillator (ICD) lead 03/02/2017  . Rectal ulcer   . Rectal bleeding   . Proctitis 02/07/2017  . Abdominal pain 02/06/2017  . Acute radiation proctitis 02/06/2017  . Failure of implantable cardioverter-defibrillator lead 11/10/2016  . ICD (implantable cardioverter-defibrillator) in place 04/08/2016  . Gastroesophageal reflux disease without esophagitis 12/31/2013  . Hypotension 09/23/2011  . HCAP (healthcare-associated pneumonia) 09/23/2011  . SIRS (systemic inflammatory response syndrome) ([place]) 09/23/2011  . Thrombocytopenia ([place]) 09/23/2011  . History of subdural hemorrhage 09/08/2011  . Ataxia 08/17/2011  . Dehydration 08/17/2011  . Atrial fibrillation with controlled ventricular response ([place]) 08/13/2011  . Colon polyp 07/07/2011  . Angiodysplasia of colon 07/07/2011  . GERD (gastroesophageal reflux disease) 05/31/2011  . Anemia, iron deficiency 12/29/2010  . Chronic anticoagulation discontinued July 2013 after developed subdural hematoma 09/22/2010  . Bradycardia 09/22/2010  . CAD (coronary artery disease) 06/25/2010  . ventricular tachycardia 12/30/2009  . Chronic atrial fibrillation ([place]) 12/30/2009  . Chronic systolic heart failure- EF 35-40% 12/30/2009  . Automatic implantable cardioverter-defibrillator in situ 12/30/2009    Past Surgical History:  Procedure Laterality Date  . CHOLECYSTECTOMY    . COLONOSCOPY  07/07/2011   Procedure: COLONOSCOPY;  Surgeon: Ulice Dash  Everitt Amber, MD;  Location: [facility];  Service: Gastroenterology;  Laterality: N/A;  . CORONARY ANGIOPLASTY WITH STENT PLACEMENT  2004   Tandem Cypher stents LAD  . CORONARY ARTERY BYPASS GRAFT  1983   SVG-mLAD  .  ESOPHAGOGASTRODUODENOSCOPY  02/11/2011   Procedure: ESOPHAGOGASTRODUODENOSCOPY (EGD);  Surgeon: Beryle Beams, MD;  Location: [facility];  Service: Endoscopy;  Laterality: N/A;  . FLEXIBLE SIGMOIDOSCOPY N/A 02/08/2017   Procedure: FLEXIBLE SIGMOIDOSCOPY;  Surgeon: Irene Shipper, MD;  Location: [facility];  Service: Endoscopy;  Laterality: N/A;  . ICD GENERATOR CHANGEOUT N/A 04/08/2016   Procedure: ICD Generator Changeout;  Surgeon: Evans Lance, MD;  Location: [place] CV LAB;  Service: Cardiovascular;  Laterality: N/A;  . ICD LEAD REMOVAL N/A 03/02/2017   Procedure: ICD LEAD REMOVAL;  Surgeon: Evans Lance, MD;  Location: [place];  Service: Cardiovascular;  Laterality: N/A;  . IMPLANTABLE CARDIOVERTER DEFIBRILLATOR (ICD) GENERATOR CHANGE N/A 05/20/2011   Procedure: ICD GENERATOR CHANGE;  Surgeon: Evans Lance, MD;  Medtronic secure dual-chamber ICD serial number [id number]   . KNEE ARTHROSCOPY     right; "just went in and scraped it"  . LEAD INSERTION N/A 03/02/2017   Procedure: LEAD INSERTION;  Surgeon: Evans Lance, MD;  Location: [place];  Service: Cardiovascular;  Laterality: N/A;  . MASS EXCISION Right 05/10/2013   Procedure: EXCISION MASS RIGHT THUMB;  Surgeon: Wynonia Sours, MD;  Location: [place];  Service: Orthopedics;  Laterality: Right;  . PROXIMAL INTERPHALANGEAL FUSION (PIP) Right 05/10/2013   Procedure: DEBRIDEMENT PROXIMAL INTERPHALANGEAL FUSION (PIP);  Surgeon: Wynonia Sours, MD;  Location: [place];  Service: Orthopedics;  Laterality: Right;  . TONSILLECTOMY             Home Medications    Prior to Admission medications   Medication Sig Start Date End Date Taking? Authorizing Provider  acetaminophen (TYLENOL) 325 MG tablet Take 2 tablets (650 mg total) by mouth every 6 (six) hours as needed for mild pain (or Fever >/= 101). 04/03/17  Yes Georgette Shell, MD  ADVAIR DISKUS 250-50 MCG/DOSE AEPB Inhale 1 puff into the lungs 2 (two)  times daily.  11/13/14  Yes [provider]  calcium carbonate (TUMS - DOSED IN MG ELEMENTAL CALCIUM) 500 MG chewable tablet Chew 500 mg by mouth daily as needed for heartburn.    Yes [provider]  digoxin (LANOXIN) 0.125 MG tablet Take 1 tablet (0.125 mg total) by mouth daily. 02/10/17  Yes Short, Noah Delaine, MD  fluticasone (FLONASE) 50 MCG/ACT nasal spray Place 2 sprays into both nostrils daily. Patient taking differently: Place 1 spray into both nostrils daily as needed for allergies.  01/04/17  Yes [name], MD  furosemide (LASIX) 40 MG tablet Take 0.5 tablets (20 mg total) by mouth daily. May take an extra tab daily as needed for swelling 04/03/17  Yes Georgette Shell, MD  gabapentin (NEURONTIN) 100 MG capsule TAKE 1 CAPSULE (100MG) TO 3 CAPSULES (300MG) EACH NIGHT AS NEEDED FOR MUSCLE PAINS Patient taking differently: Take 100-300 mg by mouth at bedtime as needed (for mucle pains).  10/05/16  Yes [name], MD  lisinopril (PRINIVIL,ZESTRIL) 10 MG tablet Take 0.5 tablets (5 mg total) by mouth daily. 04/03/17  Yes Georgette Shell, MD  loratadine (CLARITIN) 10 MG tablet Take 10 mg by mouth daily as needed for allergies.    Yes [provider]  metoprolol succinate (TOPROL-XL) 25 MG 24  hr tablet Take 1 tablet (25 mg total) by mouth daily. 02/10/17  Yes Short, Noah Delaine, MD  nitroGLYCERIN (NITROSTAT) 0.4 MG SL tablet PLACE ONE UNDER TONGUE FOR CHEST PAIN. Patient taking differently: Place 0.4 mg under tongue as needed for chest pain 11/22/16  Yes [name], Peter M, MD  phenol (CHLORASEPTIC) 1.4 % LIQD Use as directed 1 spray in the mouth or throat as needed for throat irritation / pain. 04/04/17  Yes Georgette Shell, MD  potassium chloride (K-DUR,KLOR-CON) 10 MEQ tablet Take 1 tablet (10 mEq total) by mouth daily. 01/09/13  Yes [name], Peter M, MD  traMADol (ULTRAM) 50 MG tablet TAKE 2 TABLETS BY MOUTH EVERY 8 HOURS AS NEEDED FOR PAIN Patient taking  differently: Take 100 mg by mouth every 8 (eight) hours as needed for moderate pain.  03/24/17  Yes [name], MD  RESTASIS 0.05 % ophthalmic emulsion INSTILL 1 DROP INTO BOTH EYES TWICE A DAY Patient not taking: Reported on 04/01/2017 11/17/16   [name], MD    Family History Family History  Problem Relation Age of Onset  . Tuberculosis Mother   . Tuberculosis Father   . Heart disease Brother   . Diabetes Sister   . Diabetes Brother   . Clotting disorder Brother     Social History Social History   Tobacco Use  . Smoking status: Former Smoker    Packs/day: 2.00    Years: 30.00    Pack years: 60.00    Types: Cigarettes    Last attempt to quit: 06/23/1976    Years since quitting: 40.8  . Smokeless tobacco: Never Used  Substance Use Topics  . Alcohol use: No  . Drug use: No     Allergies   Tamsulosin; Celebrex [celecoxib]; Dronedarone; Esomeprazole magnesium; Digoxin; Hydrocodone-acetaminophen; and Protonix [pantoprazole sodium]   Review of Systems Review of Systems  Respiratory: Positive for cough and shortness of breath.   Cardiovascular: Positive for chest pain.  All other systems reviewed and are negative.    Physical Exam Updated Vital Signs BP (!) 112/52   Pulse 100   Temp (!) 102.1 F (38.9 C) (Rectal)   Resp (!) 27   Ht 6' (1.829 m)   Wt 76.7 kg (169 lb)   SpO2 93%   BMI 22.92 kg/m   Physical Exam  Constitutional: He is oriented to person, place, and time. He appears well-developed and well-nourished. No distress.  HENT:  Head: Normocephalic and atraumatic.  Right Ear: Hearing normal.  Left Ear: Hearing normal.  Nose: Nose normal.  Mouth/Throat: Oropharynx is clear and moist and mucous membranes are normal.  Eyes: Pupils are equal, round, and reactive to light. Conjunctivae and EOM are normal.  Neck: Normal range of motion. Neck supple.  Cardiovascular: Regular rhythm, S1 normal and S2 normal. Tachycardia present. Exam reveals no gallop  and no friction rub.  No murmur heard. Pulmonary/Chest: Effort normal and breath sounds normal. Tachypnea noted. No respiratory distress. He exhibits no tenderness.  Abdominal: Soft. Normal appearance and bowel sounds are normal. There is no hepatosplenomegaly. There is no tenderness. There is no rebound, no guarding, no tenderness at McBurney's point and negative Murphy's sign. No hernia.  Musculoskeletal: Normal range of motion.  Neurological: He is alert and oriented to person, place, and time. He has normal strength. No cranial nerve deficit or sensory deficit. Coordination normal. GCS eye subscore is 4. GCS verbal subscore is 5. GCS motor subscore is 6.  Skin: Skin is warm, dry  and intact. No rash noted. No cyanosis.  Psychiatric: He has a normal mood and affect. His speech is normal and behavior is normal. Thought content normal.  Nursing note and vitals reviewed.    ED Treatments / Results  Labs (all labs ordered are listed, but only abnormal results are displayed) Labs Reviewed  COMPREHENSIVE METABOLIC PANEL - Abnormal; Notable for the following components:      Result Value   Chloride 100 (*)    CO2 19 (*)    Creatinine, Ser 1.28 (*)    Calcium 8.2 (*)    Total Protein 6.2 (*)    Albumin 3.2 (*)    ALT 16 (*)    Total Bilirubin 2.5 (*)    GFR calc non Af Amer 50 (*)    GFR calc Af Amer 58 (*)    Anion gap 17 (*)    All other components within normal limits  CBC WITH DIFFERENTIAL/PLATELET - Abnormal; Notable for the following components:   RDW 16.6 (*)    Platelets 101 (*)    Monocytes Absolute 1.8 (*)    All other components within normal limits  DIGOXIN LEVEL - Abnormal; Notable for the following components:   Digoxin Level 0.7 (*)    All other components within normal limits  INFLUENZA PANEL BY PCR (TYPE A & B) - Abnormal; Notable for the following components:   Influenza A By PCR POSITIVE (*)    All other components within normal limits  CULTURE, BLOOD (ROUTINE X  2)  CULTURE, BLOOD (ROUTINE X 2)  URINALYSIS, ROUTINE W REFLEX MICROSCOPIC  BRAIN NATRIURETIC PEPTIDE  I-STAT CG4 LACTIC ACID, ED  I-STAT TROPONIN, ED    EKG  EKG Interpretation None       Radiology Dg Chest 2 View  Result Date: 04/07/2017 CLINICAL DATA:  Fever and cough EXAM: CHEST - 2 VIEW COMPARISON:  04/01/2017 CT chest 03/26/2017, radiographs 03/25/2017, 03/03/2017, 12/23/2016 FINDINGS: Post sternotomy changes. Left-sided pacing device as before. Borderline to mild cardiomegaly. Aortic atherosclerosis. Patchy infiltrate at the right base. No pneumothorax. Degenerative changes of the spine. IMPRESSION: Suspected patchy infiltrate at the right base, short interval radiographic follow-up suggested to ensure clearing. Cardiomegaly. Electronically Signed   By: Donavan Foil M.D.   On: 04/07/2017 03:33    Procedures Procedures (including critical care time)  Medications Ordered in ED Medications  oseltamivir (TAMIFLU) capsule 75 mg (not administered)  ceFEPIme (MAXIPIME) 2 g in sodium chloride 0.9 % 100 mL IVPB (0 g Intravenous Stopped 04/07/17 0314)  sodium chloride 0.9 % bolus 1,000 mL (0 mLs Intravenous Stopped 04/07/17 0344)  vancomycin (VANCOCIN) 1,500 mg in sodium chloride 0.9 % 500 mL IVPB (0 mg Intravenous Stopped 04/07/17 0524)  acetaminophen (TYLENOL) tablet 650 mg (650 mg Oral Given 04/07/17 0254)     Initial Impression / Assessment and Plan / ED Course  I have reviewed the triage vital signs and the nursing notes.  Pertinent labs & imaging results that were available during my care of the patient were reviewed by me and considered in my medical decision making (see chart for details).     Patient presented with complaints of cough and chest discomfort.  Patient found to be febrile at 102.1 at arrival.  He does have persistent cough and is requiring slight oxygen by nasal cannula.  Patient was tachycardic and tachypneic at arrival.  He therefore met Sirs criteria and he does  have evidence of pneumonia on x-ray, consistent with sepsis.  Patient covered with  broad-spectrum antibiotics.  He is not hypotensive and does not have a significantly elevated lactic acid, therefore did not require aggressive fluid resuscitation.  Heart rate has improved with 1 L bolus of fluids.  His influenza A has just come back positive, Tamiflu added to his antibiotic regimen.  Will admit to hospital.  Final Clinical Impressions(s) / ED Diagnoses   Final diagnoses:  Influenza A  HCAP (healthcare-associated pneumonia)    ED Discharge Orders    None       Pollina, Gwenyth Allegra, MD 04/07/17 8590    Orpah Greek, MD 05/03/17 [phone]

## 2017-04-07 NOTE — ED Notes (Signed)
ED Provider at bedside. 

## 2017-04-07 NOTE — Progress Notes (Signed)
Pt is stable throughout the day, is on IVABX, vitals stable, denies chest pain and distress, will continue to monitor.

## 2017-04-08 DIAGNOSIS — I2510 Atherosclerotic heart disease of native coronary artery without angina pectoris: Secondary | ICD-10-CM

## 2017-04-08 LAB — EXPECTORATED SPUTUM ASSESSMENT W REFEX TO RESP CULTURE

## 2017-04-08 LAB — EXPECTORATED SPUTUM ASSESSMENT W GRAM STAIN, RFLX TO RESP C

## 2017-04-08 LAB — BASIC METABOLIC PANEL
Anion gap: 10 (ref 5–15)
BUN: 10 mg/dL (ref 6–20)
CALCIUM: 7.7 mg/dL — AB (ref 8.9–10.3)
CO2: 22 mmol/L (ref 22–32)
CREATININE: 0.92 mg/dL (ref 0.61–1.24)
Chloride: 103 mmol/L (ref 101–111)
GFR calc Af Amer: >60 mL/min (ref >60)
GLUCOSE: 98 mg/dL (ref 65–99)
Potassium: 3.4 mmol/L — ABNORMAL LOW (ref 3.5–5.1)
SODIUM: 135 mmol/L (ref 135–145)

## 2017-04-08 LAB — LEGIONELLA PNEUMOPHILA SEROGP 1 UR AG: L. PNEUMOPHILA SEROGP 1 UR AG: NEGATIVE

## 2017-04-08 LAB — CBC
HEMATOCRIT: 35.8 % — AB (ref 39.0–52.0)
Hemoglobin: 11.6 g/dL — ABNORMAL LOW (ref 13.0–17.0)
MCH: 30.1 pg (ref 26.0–34.0)
MCHC: 32.4 g/dL (ref 30.0–36.0)
MCV: 92.7 fL (ref 78.0–100.0)
PLATELETS: 102 10*3/uL — AB (ref 150–400)
RBC: 3.86 MIL/uL — ABNORMAL LOW (ref 4.22–5.81)
RDW: 16.2 % — AB (ref 11.5–15.5)
WBC: 9.9 10*3/uL (ref 4.0–10.5)

## 2017-04-08 MED ORDER — SENNOSIDES-DOCUSATE SODIUM 8.6-50 MG PO TABS
1.0000 | ORAL_TABLET | Freq: Two times a day (BID) | ORAL | Status: DC
  Administered 2017-04-08 – 2017-04-09 (×3): 1 via ORAL
  Filled 2017-04-08 (×5): qty 1

## 2017-04-08 MED ORDER — SODIUM CHLORIDE 0.9 % IV SOLN
1.0000 g | Freq: Two times a day (BID) | INTRAVENOUS | Status: DC
  Administered 2017-04-08 – 2017-04-11 (×6): 1 g via INTRAVENOUS
  Filled 2017-04-08 (×6): qty 1

## 2017-04-08 MED ORDER — MAGNESIUM SULFATE 2 GM/50ML IV SOLN
2.0000 g | Freq: Once | INTRAVENOUS | Status: AC
  Administered 2017-04-08: 2 g via INTRAVENOUS
  Filled 2017-04-08: qty 50

## 2017-04-08 MED ORDER — POTASSIUM CHLORIDE CRYS ER 20 MEQ PO TBCR
40.0000 meq | EXTENDED_RELEASE_TABLET | Freq: Two times a day (BID) | ORAL | Status: AC
  Administered 2017-04-08 – 2017-04-09 (×2): 40 meq via ORAL
  Filled 2017-04-08 (×2): qty 2

## 2017-04-08 NOTE — Progress Notes (Addendum)
PROGRESS NOTE  [name] YQM:[id number] DOB: 09-16-1932 DOA: 04/07/2017 PCP: [name], MD  HPI/Recap of past 24 hours: [name] is a 84 y.o. male with medical history significant of afibnot on anticoagulation due to prior subdural hemorrhage, PE, hypertension, hyperlipidemia, diet-controlled diabetes, asthma, GERD, SDH, CAD, s/p of CABG and stent placement, sCHF with EF 35%, AICD placement, who presents with cough, chest pain and fever.  T-max 102.1.  Right lower lobe infiltrates. Admitted for HCAP complicated by influenza A infection.  04/08/17: seen and examined. Reports constipation. Bowel regimen added with sennokot. Denies dyspnea or chest pain. A&O x 3.States he is from home and lives alone.   Assessment/Plan: Principal Problem:   Influenza A Active Problems:   Chronic atrial fibrillation (HCC)   Chronic systolic heart failure- EF 35-40%   CAD (coronary artery disease)   HCAP (healthcare-associated pneumonia)   Thrombocytopenia (HCC)   ICD (implantable cardioverter-defibrillator) in place   Sepsis ([place])   CKD (chronic kidney disease), stage III (HCC)   Acute respiratory failure with hypoxia (HCC)  Acute hypoxic respiratory failure 2/2 to HCAP  And Influenza A, poa -continue IV cefepime -continue tamiflu BID for total 5 days -wean off oxygen Repeat cxr in the morning -continue breathing treatments -home O2 eval  Chronic a-fib -not on OAC due to hx GI bleed -continue digoxin and metoprolol succi -rate controlled -continue monitoring  Hypokalemia -K+ 3.4 -repleted with po kcl and iV mag  Normocytic anemia -hg 11.6 -Baseline 13 -possibly dilutional   Polyneuropathy -continue gabapentin  Constipation -started senokot BID -monitor stool output  Chronic HFREF 35% post AICD -not in exacerbation  Thrombocytopenia -unclear etiology -no overt bleeding -plt 102k  Code Status: full  Family Communication: none at bedside   Disposition  Plan: Home with home health services    Consultants:  none  Procedures:  none  Antimicrobials:  IV cefepime Tamiflu  DVT prophylaxis:  SCDs   Objective: Vitals:   04/07/17 2100 04/08/17 0048 04/08/17 0559 04/08/17 0821  BP: 130/60 (!) 116/58 (!) 109/50 110/60  Pulse: 92 78 75 74  Resp: 18 18 18    Temp: (!) 97.5 F (36.4 C) 97.8 F (36.6 C) 97.8 F (36.6 C)   TempSrc: Oral Oral Oral   SpO2: 98% 95% 94%   Weight:   76 kg (167 lb 8 oz)   Height:        Intake/Output Summary (Last 24 hours) at 04/08/2017 1127 Last data filed at 04/08/2017 0742 Gross per 24 hour  Intake 2362.5 ml  Output 850 ml  Net 1512.5 ml   Filed Weights   04/07/17 0208 04/07/17 0756 04/08/17 0559  Weight: 76.7 kg (169 lb) 76.4 kg (168 lb 6.4 oz) 76 kg (167 lb 8 oz)    Exam:   General:  84 yo CM WDWN NAD A&O x3   Cardiovascular: IRR no rubs or gallops  Respiratory: CTA no wheezes or rales  Abdomen: soft NT ND NBS x4   Musculoskeletal: non focal deficits   Skin: no rash  Psychiatry: Mood is appropriate    Data Reviewed: CBC: Recent Labs  Lab 04/01/17 1424 04/02/17 0656 04/07/17 0236  WBC 9.1 6.0 9.5  NEUTROABS 5.1  --  5.4  HGB 14.1 12.5* 13.4  HCT 42.7 38.5* 41.9  MCV 92.6 93.7 94.2  PLT 113* 100* 528*   Basic Metabolic Panel: Recent Labs  Lab 04/01/17 1209 04/02/17 0656 04/03/17 0836 04/04/17 0449 04/07/17 0236  NA 138 140 139  140 136  K 3.6 3.2* 3.8 3.5 4.0  CL 101 106 104 107 100*  CO2 22 23 23 22  19*  GLUCOSE 133* 95 115* 82 84  BUN 31* 24* 20 15 18   CREATININE 1.88* 1.36* 1.13 1.00 1.28*  CALCIUM 9.0 8.6* 8.9 8.5* 8.2*   GFR: Estimated Creatinine Clearance: 47.8 mL/min (A) (by C-G formula based on SCr of 1.28 mg/dL (H)). Liver Function Tests: Recent Labs  Lab 04/01/17 1209 04/03/17 0836 04/04/17 0449 04/07/17 0236  AST 31 35  33 24 33  ALT 11* 11*  13* 11* 16*  ALKPHOS 55 55  49 46 50  BILITOT 2.6* 2.6*  2.7* 1.6* 2.5*  PROT 6.5 6.4*   6.3* 5.9* 6.2*  ALBUMIN 3.7 3.6  3.4* 3.1* 3.2*   No results for input(s): LIPASE, AMYLASE in the last 168 hours. No results for input(s): AMMONIA in the last 168 hours. Coagulation Profile: No results for input(s): INR, PROTIME in the last 168 hours. Cardiac Enzymes: Recent Labs  Lab 04/01/17 1209 04/01/17 2101  CKTOTAL 262  --   TROPONINI  --  0.06*   BNP (last 3 results) No results for input(s): PROBNP in the last 8760 hours. HbA1C: No results for input(s): HGBA1C in the last 72 hours. CBG: No results for input(s): GLUCAP in the last 168 hours. Lipid Profile: No results for input(s): CHOL, HDL, LDLCALC, TRIG, CHOLHDL, LDLDIRECT in the last 72 hours. Thyroid Function Tests: No results for input(s): TSH, T4TOTAL, FREET4, T3FREE, THYROIDAB in the last 72 hours. Anemia Panel: No results for input(s): VITAMINB12, FOLATE, FERRITIN, TIBC, IRON, RETICCTPCT in the last 72 hours. Urine analysis:    Component Value Date/Time   COLORURINE AMBER (A) 04/07/2017 0218   APPEARANCEUR HAZY (A) 04/07/2017 0218   LABSPEC 1.028 04/07/2017 0218   PHURINE 5.0 04/07/2017 0218   GLUCOSEU NEGATIVE 04/07/2017 0218   HGBUR SMALL (A) 04/07/2017 0218   BILIRUBINUR NEGATIVE 04/07/2017 0218   KETONESUR 20 (A) 04/07/2017 0218   PROTEINUR 100 (A) 04/07/2017 0218   UROBILINOGEN 0.2 09/09/2012 1501   NITRITE NEGATIVE 04/07/2017 0218   LEUKOCYTESUR NEGATIVE 04/07/2017 0218   Sepsis Labs: @LABRCNTIP (procalcitonin:4,lacticidven:4)  ) Recent Results (from the past 240 hour(s))  MRSA PCR Screening     Status: None   Collection Time: 04/04/17  1:21 AM  Result Value Ref Range Status   MRSA by PCR NEGATIVE NEGATIVE Final    Comment:        The GeneXpert MRSA Assay (FDA approved for NASAL specimens only), is one component of a comprehensive MRSA colonization surveillance program. It is not intended to diagnose MRSA infection nor to guide or monitor treatment for MRSA infections. Performed at  [facility], [place] [address] [place], [place] [address]       Studies: No results found.  Scheduled Meds: . digoxin  0.125 mg Oral Daily  . metoprolol succinate  25 mg Oral Daily  . oseltamivir  30 mg Oral BID    Continuous Infusions: . sodium chloride 75 mL/hr at 04/08/17 0711  . ceFEPime (MAXIPIME) IV Stopped (04/08/17 0054)  . vancomycin Stopped (04/08/17 0601)     LOS: 1 day     Kayleen Memos, MD [facility] Pager [phone]  If 7PM-7AM, please contact night-coverage [email or web address] Password [facility] 04/08/2017, 11:27 AM

## 2017-04-08 NOTE — Progress Notes (Signed)
Lab called RN that the sputum and gram stain sent earlier has too much sample and need to be recollected. RN will recollect and send it out again.

## 2017-04-08 NOTE — Progress Notes (Signed)
SATURATION QUALIFICATIONS: (This note is used to comply with regulatory documentation for home oxygen)  Patient Saturations on Room Air at Rest = 96%  Patient Saturations on Room Air while Ambulating =94%  Pt walk just a bit to the doorway and denies any distress

## 2017-04-09 ENCOUNTER — Inpatient Hospital Stay (HOSPITAL_COMMUNITY): Payer: Medicare Other

## 2017-04-09 DIAGNOSIS — R0902 Hypoxemia: Secondary | ICD-10-CM

## 2017-04-09 LAB — BASIC METABOLIC PANEL
Anion gap: 10 (ref 5–15)
BUN: 9 mg/dL (ref 6–20)
CALCIUM: 7.5 mg/dL — AB (ref 8.9–10.3)
CO2: 19 mmol/L — AB (ref 22–32)
Chloride: 104 mmol/L (ref 101–111)
Creatinine, Ser: 0.8 mg/dL (ref 0.61–1.24)
GFR calc Af Amer: >60 mL/min (ref >60)
GLUCOSE: 88 mg/dL (ref 65–99)
Potassium: 3.5 mmol/L (ref 3.5–5.1)
Sodium: 133 mmol/L — ABNORMAL LOW (ref 135–145)

## 2017-04-09 LAB — CBC
HEMATOCRIT: 35.1 % — AB (ref 39.0–52.0)
Hemoglobin: 11.5 g/dL — ABNORMAL LOW (ref 13.0–17.0)
MCH: 30.1 pg (ref 26.0–34.0)
MCHC: 32.8 g/dL (ref 30.0–36.0)
MCV: 91.9 fL (ref 78.0–100.0)
PLATELETS: 120 10*3/uL — AB (ref 150–400)
RBC: 3.82 MIL/uL — ABNORMAL LOW (ref 4.22–5.81)
RDW: 16.1 % — AB (ref 11.5–15.5)
WBC: 9 10*3/uL (ref 4.0–10.5)

## 2017-04-09 LAB — URINE CULTURE: Culture: <10000 — AB

## 2017-04-09 LAB — PROCALCITONIN: Procalcitonin: 0.17 ng/mL

## 2017-04-09 LAB — MAGNESIUM: Magnesium: 1.7 mg/dL (ref 1.7–2.4)

## 2017-04-09 MED ORDER — FUROSEMIDE 10 MG/ML IJ SOLN
40.0000 mg | Freq: Once | INTRAMUSCULAR | Status: AC
  Administered 2017-04-09: 40 mg via INTRAVENOUS
  Filled 2017-04-09: qty 4

## 2017-04-09 MED ORDER — IPRATROPIUM-ALBUTEROL 0.5-2.5 (3) MG/3ML IN SOLN: 3.0000 mL | RESPIRATORY_TRACT | Status: DC | PRN

## 2017-04-09 MED ORDER — GUAIFENESIN-DM 100-10 MG/5ML PO SYRP
10.0000 mL | ORAL_SOLUTION | Freq: Four times a day (QID) | ORAL | Status: DC | PRN
  Administered 2017-04-09 – 2017-04-13 (×3): 10 mL via ORAL
  Filled 2017-04-09 (×3): qty 10

## 2017-04-09 MED ORDER — ALBUTEROL SULFATE (2.5 MG/3ML) 0.083% IN NEBU
2.5000 mg | INHALATION_SOLUTION | Freq: Four times a day (QID) | RESPIRATORY_TRACT | Status: DC
  Administered 2017-04-09 – 2017-04-10 (×3): 2.5 mg via RESPIRATORY_TRACT
  Filled 2017-04-09 (×3): qty 3

## 2017-04-09 MED ORDER — SODIUM CHLORIDE 3 % IN NEBU
4.0000 mL | INHALATION_SOLUTION | Freq: Three times a day (TID) | RESPIRATORY_TRACT | Status: AC
  Administered 2017-04-09 – 2017-04-12 (×9): 4 mL via RESPIRATORY_TRACT
  Filled 2017-04-09 (×10): qty 4

## 2017-04-09 MED ORDER — DM-GUAIFENESIN ER 30-600 MG PO TB12
1.0000 | ORAL_TABLET | Freq: Two times a day (BID) | ORAL | Status: DC
  Administered 2017-04-09 – 2017-04-14 (×4): 1 via ORAL
  Filled 2017-04-09 (×5): qty 1

## 2017-04-09 NOTE — Progress Notes (Signed)
PROGRESS NOTE  [name] URK:[id number] DOB: 25-Dec-1932 DOA: 04/07/2017 PCP: [name], MD  HPI/Recap of past 24 hours: [name] is a 84 y.o. male with medical history significant of afibnot on anticoagulation due to prior subdural hemorrhage, PE, hypertension, hyperlipidemia, diet-controlled diabetes, asthma, GERD, SDH, CAD, s/p of CABG and stent placement, sCHF with EF 35%, AICD placement, who presents with cough, chest pain and fever.  T-max 102.1.  Right lower lobe infiltrates. Admitted for HCAP complicated by influenza A infection.  04/08/17: seen and examined. Reports constipation. Bowel regimen added with sennokot. Denies dyspnea or chest pain. A&O x 3.States he is from home and lives alone.  04/09/17: Patient seen and examined at his bedside.  RN reports persistent cough.  Patient started on aggressive pulmonary toilet.  Patient denies dyspnea or chest pain or palpitations.   Assessment/Plan: Principal Problem:   Influenza A Active Problems:   Chronic atrial fibrillation (HCC)   Chronic systolic heart failure- EF 35-40%   CAD (coronary artery disease)   HCAP (healthcare-associated pneumonia)   Thrombocytopenia (HCC)   ICD (implantable cardioverter-defibrillator) in place   Sepsis ([place])   CKD (chronic kidney disease), stage III (HCC)   Acute respiratory failure with hypoxia (HCC)  Acute hypoxic respiratory failure 2/2 to HCAP  And Influenza A, poa -continue IV cefepime -continue tamiflu BID for total 5 days -wean off oxygen -Repeat cxr done on 04/09/2017 reveals patchy infiltrate in the right lower lobe.  Some increase in pulmonary vascularity. -1 dose IV Lasix 40 mg given -Started aggressive pulmonary toilet -Duo nebs every 6 hours every 2 hours as needed hypersaline nebs 3 times daily chest PT Mucinex twice daily -continue around-the-clock breathing treatments -Past home O2 evaluation done on 04/08/2017.  Will now require any additional O2  supplementation.  Intractable cough most likely secondary to HCAP Versus influenza A -Management as stated above  Chronic a-fib -not on [place] due to hx GI bleed -continue digoxin and metoprolol succi -rate controlled -continue monitoring  Hypokalemia -K+ 3.4 -repleted with po kcl and iV mag -Ordered BMP magnesium this morning awaiting results  Normocytic anemia -hg 11.6 -Baseline 13 -possibly dilutional  -Ordered CBC this morning awaiting results  Polyneuropathy -continue gabapentin  Constipation, resolved - continue Senokot BID -Had one bowel movement yesterday 04/08/2017 -monitor stool output  Chronic HFREF 35% post AICD -Continue cardiac meds -Lasix given today -Monitor urine output, I&Os, daily weight  Thrombocytopenia -unclear etiology -no overt bleeding -plt 102k -CBC repeated today  Code Status: full  Family Communication: none at bedside   Disposition Plan: Home with home health services    Consultants:  none  Procedures:  none  Antimicrobials:  IV cefepime Tamiflu  DVT prophylaxis:  SCDs   Objective: Vitals:   04/08/17 1221 04/08/17 2016 04/09/17 0433 04/09/17 0731  BP: 133/80 115/60 104/76 (!) 100/50  Pulse: 100 63 93 92  Resp: 18 18 16    Temp: 97.7 F (36.5 C) 97.8 F (36.6 C) 97.8 F (36.6 C)   TempSrc: Oral Oral Tympanic   SpO2: 94% 99% 96%   Weight:   77.6 kg (171 lb 1.6 oz)   Height:        Intake/Output Summary (Last 24 hours) at 04/09/2017 1125 Last data filed at 04/09/2017 0900 Gross per 24 hour  Intake 1397.5 ml  Output 900 ml  Net 497.5 ml   Filed Weights   04/07/17 0756 04/08/17 0559 04/09/17 0433  Weight: 76.4 kg (168 lb 6.4 oz) 76  kg (167 lb 8 oz) 77.6 kg (171 lb 1.6 oz)    Exam: 04/09/2017.  Patient seen and examined.  Physical exam is unchanged from previous.  Except for mentioned below.   General:  84 yo CM WDWN NAD   Cardiovascular: IRR no rubs or gallops  Respiratory: Mild rales at bases.  No  wheezes.  Abdomen: soft NT ND NBS x4   Musculoskeletal: non focal deficits   Skin: no rash  Psychiatry: Mood is appropriate    Data Reviewed: CBC: Recent Labs  Lab 04/07/17 0236 04/08/17 1134  WBC 9.5 9.9  NEUTROABS 5.4  --   HGB 13.4 11.6*  HCT 41.9 35.8*  MCV 94.2 92.7  PLT 101* 409*   Basic Metabolic Panel: Recent Labs  Lab 04/03/17 0836 04/04/17 0449 04/07/17 0236 04/08/17 1134  NA 139 140 136 135  K 3.8 3.5 4.0 3.4*  CL 104 107 100* 103  CO2 23 22 19* 22  GLUCOSE 115* 82 84 98  BUN 20 15 18 10   CREATININE 1.13 1.00 1.28* 0.92  CALCIUM 8.9 8.5* 8.2* 7.7*   GFR: Estimated Creatinine Clearance: 67.9 mL/min (by C-G formula based on SCr of 0.92 mg/dL). Liver Function Tests: Recent Labs  Lab 04/03/17 0836 04/04/17 0449 04/07/17 0236  AST 35  33 24 33  ALT 11*  13* 11* 16*  ALKPHOS 55  49 46 50  BILITOT 2.6*  2.7* 1.6* 2.5*  PROT 6.4*  6.3* 5.9* 6.2*  ALBUMIN 3.6  3.4* 3.1* 3.2*   No results for input(s): LIPASE, AMYLASE in the last 168 hours. No results for input(s): AMMONIA in the last 168 hours. Coagulation Profile: No results for input(s): INR, PROTIME in the last 168 hours. Cardiac Enzymes: No results for input(s): CKTOTAL, CKMB, CKMBINDEX, TROPONINI in the last 168 hours. BNP (last 3 results) No results for input(s): PROBNP in the last 8760 hours. HbA1C: No results for input(s): HGBA1C in the last 72 hours. CBG: No results for input(s): GLUCAP in the last 168 hours. Lipid Profile: No results for input(s): CHOL, HDL, LDLCALC, TRIG, CHOLHDL, LDLDIRECT in the last 72 hours. Thyroid Function Tests: No results for input(s): TSH, T4TOTAL, FREET4, T3FREE, THYROIDAB in the last 72 hours. Anemia Panel: No results for input(s): VITAMINB12, FOLATE, FERRITIN, TIBC, IRON, RETICCTPCT in the last 72 hours. Urine analysis:    Component Value Date/Time   COLORURINE AMBER (A) 04/07/2017 0218   APPEARANCEUR HAZY (A) 04/07/2017 0218   LABSPEC 1.028  04/07/2017 0218   PHURINE 5.0 04/07/2017 0218   GLUCOSEU NEGATIVE 04/07/2017 0218   HGBUR SMALL (A) 04/07/2017 0218   BILIRUBINUR NEGATIVE 04/07/2017 0218   KETONESUR 20 (A) 04/07/2017 0218   PROTEINUR 100 (A) 04/07/2017 0218   UROBILINOGEN 0.2 09/09/2012 1501   NITRITE NEGATIVE 04/07/2017 0218   LEUKOCYTESUR NEGATIVE 04/07/2017 0218   Sepsis Labs: @LABRCNTIP (procalcitonin:4,lacticidven:4)  ) Recent Results (from the past 240 hour(s))  MRSA PCR Screening     Status: None   Collection Time: 04/04/17  1:21 AM  Result Value Ref Range Status   MRSA by PCR NEGATIVE NEGATIVE Final    Comment:        The GeneXpert MRSA Assay (FDA approved for NASAL specimens only), is one component of a comprehensive MRSA colonization surveillance program. It is not intended to diagnose MRSA infection nor to guide or monitor treatment for MRSA infections. Performed at [facility], [place] [address] [place], [place] [address]   Blood Culture (routine x 2)  Status: None (Preliminary result)   Collection Time: 04/07/17  2:20 AM  Result Value Ref Range Status   Specimen Description BLOOD RIGHT ANTECUBITAL  Final   Special Requests IN PEDIATRIC BOTTLE Blood Culture adequate volume  Final   Culture   Final    NO GROWTH 1 DAY Performed at [facility], [place] [address], [place], [place] [address]    Report Status PENDING  Incomplete  Blood Culture (routine x 2)     Status: None (Preliminary result)   Collection Time: 04/07/17  2:35 AM  Result Value Ref Range Status   Specimen Description BLOOD RIGHT HAND  Final   Special Requests IN PEDIATRIC BOTTLE Blood Culture adequate volume  Final   Culture   Final    NO GROWTH 1 DAY Performed at [facility], [place] [address] [place], [place] [address]    Report Status PENDING  Incomplete  Culture, Urine     Status: Abnormal   Collection Time: 04/07/17  6:06 PM  Result Value Ref Range Status   Specimen Description URINE, RANDOM  Final     Special Requests NONE  Final   Culture (A)  Final    <10,000 COLONIES/mL Performed at [facility], [place] [address], [place], [place] [address]    Report Status 04/09/2017 FINAL  Final  Culture, sputum-assessment     Status: None   Collection Time: 04/08/17 12:51 PM  Result Value Ref Range Status   Specimen Description SPUTUM  Final   Special Requests NONE  Final   Sputum evaluation   Final    Sputum specimen not acceptable for testing.  Please recollect.   Gram Stain Report Called to,Read Back By and Verified With: R ARYAL,RN AT 1507 04/08/17 BY L BENFIELD Performed at [facility], [place] [address] [place], [place] [address]    Report Status 04/08/2017 FINAL  Final      Studies: Dg Chest Port 1 View  Result Date: 04/09/2017 CLINICAL DATA:  Hypoxia EXAM: PORTABLE CHEST 1 VIEW COMPARISON:  04/07/2017 FINDINGS: Left AICD remains in place, unchanged. Epicardial pacer wires noted over the left heart, stable. Cardiomegaly. There is hyperinflation of the lungs compatible with COPD. Increasing right basilar airspace opacity and small right effusion concerning for pneumonia. IMPRESSION: COPD, cardiomegaly, stable. Increasing right basilar airspace opacity and small right effusion concerning for pneumonia. Electronically Signed   By: Rolm Baptise M.D.   On: 04/09/2017 07:44    Scheduled Meds: . digoxin  0.125 mg Oral Daily  . metoprolol succinate  25 mg Oral Daily  . oseltamivir  30 mg Oral BID  . senna-docusate  1 tablet Oral BID    Continuous Infusions: . ceFEPime (MAXIPIME) IV Stopped (04/09/17 6599)     LOS: 2 days     Kayleen Memos, MD [facility] Pager [phone]  If 7PM-7AM, please contact night-coverage [email or web address] Password [email or web address] 04/09/2017, 11:25 AM

## 2017-04-09 NOTE — Progress Notes (Signed)
Swallow evaluation done, barium swallow test is due, until then pt will be in NPO except sips of water with cups and medicine crushed with puree, MD aware, pt's family members called and informed about his discharge status by Chauncey Fischer, RN

## 2017-04-09 NOTE — Evaluation (Signed)
Clinical/Bedside Swallow Evaluation Patient Details  Name: [name] MRN: [id number] Date of Birth: [birth year]  Today's Date: 04/09/2017 Time: SLP Start Time (ACUTE ONLY): 1400 SLP Stop Time (ACUTE ONLY): 1425 SLP Time Calculation (min) (ACUTE ONLY): 25 min  Past Medical History:  Past Medical History:  Diagnosis Date  . Abnormal thyroid scan    Abnormal thyroid imaging studies from 11/09/2010, status post ultrasound guided fine needle aspiration of the dominant left inferior thyroid nodule on 12/15/2010. Cytology report showed rare follicular epithelial cells and hemosiderin laden macrophages.  Marland Kitchen AICD (automatic cardioverter/defibrillator) present    a. fx lead; a. s/p lead extraction 03/02/17  . Arthritis    "all over"  . Asthma   . Atrial fibrillation ([place])    on chronic Coumadin; stopped July 2013 due to subdural hematomas  . CHF (congestive heart failure) (HCC)    EF 35-40% s/p most recent ICD generator change-out with Medtronic dual-chamber ICD 05/20/11 with explantation of previous abdominally-implanted device  . Coronary artery disease    s/p CABG 1983 and PCI/stent 2004.   . Diabetes mellitus    diet controlled  . Dyslipidemia   . Erythrocytosis   . GERD (gastroesophageal reflux disease)   . Hypertension   . Ischemic cardiomyopathy    WITH CHF  . Monocytosis 04/17/2013  . Myocardial infarction ([place]) 1983; ~ 1990  . Pneumonia August 2013  . Subdural hematoma [facility]) July 2013   Anticoagulation stopped.   . SunDown syndrome   . VT (ventricular tachycardia) ([place])    Past Surgical History:  Past Surgical History:  Procedure Laterality Date  . CHOLECYSTECTOMY    . COLONOSCOPY  07/07/2011   Procedure: COLONOSCOPY;  Surgeon: Jerene Bears, MD;  Location: [facility] END[facility]COPY;  Service: Gastroenterology;  Laterality: N/A;  . CORONARY ANGIOPLASTY WITH STENT PLACEMENT  2004   Tandem Cypher stents LAD  . CORONARY ARTERY BYPASS GRAFT  1983   SVG-mLAD  .  ESOPHAGOGASTRODUODENOSCOPY  02/11/2011   Procedure: ESOPHAGOGASTRODUODENOSCOPY (EGD);  Surgeon: Beryle Beams, MD;  Location: [facility];  Service: Endoscopy;  Laterality: N/A;  . FLEXIBLE SIGMOIDOSCOPY N/A 02/08/2017   Procedure: FLEXIBLE SIGMOIDOSCOPY;  Surgeon: Irene Shipper, MD;  Location: [facility];  Service: Endoscopy;  Laterality: N/A;  . ICD GENERATOR CHANGEOUT N/A 04/08/2016   Procedure: ICD Generator Changeout;  Surgeon: Evans Lance, MD;  Location: [place] [facility] LAB;  Service: Cardiovascular;  Laterality: N/A;  . ICD LEAD REMOVAL N/A 03/02/2017   Procedure: ICD LEAD REMOVAL;  Surgeon: Evans Lance, MD;  Location: [place];  Service: Cardiovascular;  Laterality: N/A;  . IMPLANTABLE CARDIOVERTER DEFIBRILLATOR (ICD) GENERATOR CHANGE N/A 05/20/2011   Procedure: ICD GENERATOR CHANGE;  Surgeon: Evans Lance, MD;  Medtronic secure dual-chamber ICD serial number [id number]   . KNEE ARTHROSCOPY     right; "just went in and scraped it"  . LEAD INSERTION N/A 03/02/2017   Procedure: LEAD INSERTION;  Surgeon: Evans Lance, MD;  Location: [place];  Service: Cardiovascular;  Laterality: N/A;  . MASS EXCISION Right 05/10/2013   Procedure: EXCISION MASS RIGHT THUMB;  Surgeon: Wynonia Sours, MD;  Location: [facility];  Service: Orthopedics;  Laterality: Right;  . PROXIMAL INTERPHALANGEAL FUSION (PIP) Right 05/10/2013   Procedure: DEBRIDEMENT PROXIMAL INTERPHALANGEAL FUSION (PIP);  Surgeon: Wynonia Sours, MD;  Location: [place];  Service: Orthopedics;  Laterality: Right;  . TONSILLECTOMY         HPI:  Joshua Teare Ludwickis a  84 y.o.malewith medical history significant ofafibnot on anticoagulation due to prior subdural hemorrhage,PE,hypertension, hyperlipidemia, diet-controlled diabetes, asthma, GERD, SDH, CAD,s/p ofCABGandstent placement,sCHFwith EF 35%, AICD placement, who presents with cough, chest pain and fever. T-max 102.1. Right lower lobe infiltrates.  Admitted for HCAP complicated by influenza A infection. Pt has a history of mild oropharyngeal and cervical esophageal dysphagia per MBS 02/07/14; pharyngeal residuals noted across consistencies that liquid swallow help to clear but with resultant trace aspiration of liquids (thin and nectar). Regular diet with thin liquids was recommended as pt's weight was stable and no PNA since 2013.   Assessment / Plan / Recommendation Clinical Impression   Pt presents with moderate risk for aspiration and suspected acute on chronic dysphagia. Pt has a known history of silent aspiration per MBS in 2016. Pt reports increased coughing with PO recently; he is unsure of when this started. He is coughing at baseline, but there is increased coughing with PO. He presents with signs of aspiration across consistencies; there is throat clearing, wet coughing after thin liquids (worse with straw), multiple swallows with purees, and significant coughing with solids, and pt ultimately expectorates. Given complaints, clinical signs of aspiraiton and right lower lobe infiltrates on CXR, recommend NPO with the exception of cup sips of water after oral care, medications crushed in puree. Will follow up next date for MBS.      SLP Visit Diagnosis: Dysphagia, oropharyngeal phase (R13.12);Dysphagia, pharyngoesophageal phase (R13.14)    Aspiration Risk  Moderate aspiration risk    Diet Recommendation Free water protocol after oral care;NPO   Liquid Administration via: Cup;No straw Medication Administration: Crushed with puree Supervision: Full supervision/cueing for compensatory strategies Compensations: Slow rate;Small sips/bites    Other  Recommendations Oral Care Recommendations: Oral care QID;Oral care prior to ice chip/H20   Follow up Recommendations Other (comment)(tbd)      Frequency and Duration            Prognosis Prognosis for Safe Diet Advancement: Fair Barriers to Reach Goals: Other (Comment)(acute on  chronic dysphagia)      Swallow Study   General Date of Onset: 04/07/17 HPI: Joshua Lindfors Ludwickis a 84 y.o.malewith medical history significant ofafibnot on anticoagulation due to prior subdural hemorrhage,PE,hypertension, hyperlipidemia, diet-controlled diabetes, asthma, GERD, SDH, CAD,s/p ofCABGandstent placement,sCHFwith EF 35%, AICD placement, who presents with cough, chest pain and fever. T-max 102.1. Right lower lobe infiltrates. Admitted for HCAP complicated by influenza A infection. Pt has a history of mild oropharyngeal and cervical esophageal dysphagia per MBS 02/07/14; pharyngeal residuals noted across consistencies that liquid swallow help to clear but with resultant trace aspiration of liquids (thin and nectar). Regular diet with thin liquids was recommended as pt's weight was stable and no PNA since 2013. Type of Study: Bedside Swallow Evaluation Previous Swallow Assessment: see HPI Diet Prior to this Study: Regular;Thin liquids Temperature Spikes Noted: No Respiratory Status: Room air History of Recent Intubation: No Behavior/Cognition: Alert;Cooperative;Confused Oral Cavity Assessment: Within Functional Limits Oral Care Completed by SLP: No Oral Cavity - Dentition: Edentulous Vision: Functional for self-feeding Self-Feeding Abilities: Able to feed self Patient Positioning: Upright in bed Baseline Vocal Quality: Normal Volitional Cough: Weak;Congested Volitional Swallow: Able to elicit    Oral/Motor/Sensory Function Overall Oral Motor/Sensory Function: Within functional limits   Ice Chips Ice chips: Within functional limits   Thin Liquid Thin Liquid: Impaired Pharyngeal  Phase Impairments: Throat Clearing - Immediate;Cough - Immediate;Multiple swallows(immediate cough with straw)    Nectar Thick Nectar Thick Liquid: Not tested  Honey Thick Honey Thick Liquid: Not tested   Puree Puree: Impaired Presentation: Self Fed;Spoon Pharyngeal Phase Impairments:  Multiple swallows   Solid   GO    [name], [place], CCC-SLP Speech-Language Pathologist [phone] Solid: Impaired Presentation: Self Fed Oral Phase Functional Implications: Impaired mastication;Prolonged oral transit Pharyngeal Phase Impairments: Cough - Immediate(expectorated bolus after coughing)        Aliene Altes 04/09/2017,2:31 PM

## 2017-04-10 ENCOUNTER — Encounter: Payer: Medicare Other | Admitting: *Deleted

## 2017-04-10 ENCOUNTER — Telehealth: Payer: Self-pay | Admitting: Cardiology

## 2017-04-10 ENCOUNTER — Inpatient Hospital Stay (HOSPITAL_COMMUNITY): Payer: Medicare Other

## 2017-04-10 DIAGNOSIS — G9341 Metabolic encephalopathy: Secondary | ICD-10-CM

## 2017-04-10 DIAGNOSIS — R1310 Dysphagia, unspecified: Secondary | ICD-10-CM

## 2017-04-10 LAB — BASIC METABOLIC PANEL
Anion gap: 13 (ref 5–15)
BUN: 10 mg/dL (ref 6–20)
CHLORIDE: 106 mmol/L (ref 101–111)
CO2: 19 mmol/L — ABNORMAL LOW (ref 22–32)
CREATININE: 0.96 mg/dL (ref 0.61–1.24)
Calcium: 8.2 mg/dL — ABNORMAL LOW (ref 8.9–10.3)
GFR calc Af Amer: >60 mL/min (ref >60)
GFR calc non Af Amer: >60 mL/min (ref >60)
Glucose, Bld: 89 mg/dL (ref 65–99)
POTASSIUM: 3.4 mmol/L — AB (ref 3.5–5.1)
SODIUM: 138 mmol/L (ref 135–145)

## 2017-04-10 LAB — CBC
HEMATOCRIT: 37.2 % — AB (ref 39.0–52.0)
Hemoglobin: 12.5 g/dL — ABNORMAL LOW (ref 13.0–17.0)
MCH: 30.9 pg (ref 26.0–34.0)
MCHC: 33.6 g/dL (ref 30.0–36.0)
MCV: 91.9 fL (ref 78.0–100.0)
PLATELETS: 138 10*3/uL — AB (ref 150–400)
RBC: 4.05 MIL/uL — ABNORMAL LOW (ref 4.22–5.81)
RDW: 16.4 % — AB (ref 11.5–15.5)
WBC: 10.3 10*3/uL (ref 4.0–10.5)

## 2017-04-10 MED ORDER — ALBUTEROL SULFATE (2.5 MG/3ML) 0.083% IN NEBU
2.5000 mg | INHALATION_SOLUTION | Freq: Two times a day (BID) | RESPIRATORY_TRACT | Status: DC
Start: 2017-04-10 — End: 2017-04-12
  Administered 2017-04-10 – 2017-04-12 (×4): 2.5 mg via RESPIRATORY_TRACT
  Filled 2017-04-10 (×4): qty 3

## 2017-04-10 MED ORDER — MORPHINE SULFATE (PF) 2 MG/ML IV SOLN: 1.0000 mg | INTRAVENOUS | Status: DC | PRN

## 2017-04-10 MED ORDER — OSELTAMIVIR PHOSPHATE 75 MG PO CAPS
75.0000 mg | ORAL_CAPSULE | Freq: Two times a day (BID) | ORAL | Status: DC
  Filled 2017-04-10 (×3): qty 1

## 2017-04-10 NOTE — Evaluation (Signed)
Physical Therapy Evaluation Patient Details Name: [name] MRN: [id number] DOB: 1932/03/22 Today's Date: 04/10/2017   History of Present Illness  84 y.o. male with medical history significant of afib not on anticoagulation due to prior subdural hemorrhage, PE, hypertension, hyperlipidemia, diet-controlled diabetes, asthma, GERD, SDH, CAD, s/p of CABG and stent placement, sCHF with EF 35%, AICD placement, who presents with cough, chest pain and fever.  Admitted for HCAP complicated by influenza A infection.    Clinical Impression  Pt admitted with above diagnosis. Pt currently with functional limitations due to the deficits listed below (see PT Problem List). On eval, pt required min assist bed mobility, min assist transfers, and min guard assist ambulation 100 feet with RW. Per previous admission, pt with occasional R knee buckling. No buckling noted this session.  Pt will benefit from skilled PT to increase their independence and safety with mobility to allow discharge to the venue listed below.       Follow Up Recommendations SNF;Supervision/Assistance - 24 hour    Equipment Recommendations  None recommended by PT    Recommendations for Other Services       Precautions / Restrictions Precautions Precautions: Fall Precaution Comments: Hx of falls at home  Restrictions LUE Weight Bearing: Non weight bearing      Mobility  Bed Mobility Overal bed mobility: Needs Assistance Bed Mobility: Supine to Sit;Sit to Supine     Supine to sit: Min assist;HOB elevated Sit to supine: Min assist;HOB elevated   General bed mobility comments: +rail, assist elevating trunk sup to sit, assist with BLE sit to sup  Transfers Overall transfer level: Needs assistance Equipment used: Rolling walker (2 wheeled) Transfers: Sit to/from Stand Sit to Stand: Min assist         General transfer comment: verbal cues for hand placement. Assist to power up.    Ambulation/Gait Ambulation/Gait assistance: Min guard Ambulation Distance (Feet): 100 Feet Assistive device: Rolling walker (2 wheeled) Gait Pattern/deviations: Step-through pattern;Decreased stride length;Shuffle;Trunk flexed Gait velocity: Decreased Gait velocity interpretation: Below normal speed for age/gender General Gait Details: Slow, shuffle gait. No LOB. Poor activity tolerace.   Stairs            Wheelchair Mobility    Modified Rankin (Stroke Patients Only)       Balance Overall balance assessment: Needs assistance Sitting-balance support: No upper extremity supported;Feet supported Sitting balance-Leahy Scale: Good     Standing balance support: During functional activity;Bilateral upper extremity supported Standing balance-Leahy Scale: Fair                               Pertinent Vitals/Pain Pain Assessment: No/denies pain    Home Living Family/patient expects to be discharged to:: Private residence Living Arrangements: Alone Available Help at Discharge: Neighbor;Other (Comment)(checks in a few times a week) Type of Home: House Home Access: Level entry     Home Layout: One level Home Equipment: Walker - 2 wheels;Walker - 4 wheels;Cane - single point;Grab bars - toilet;Grab bars - tub/shower      Prior Function Level of Independence: Independent with assistive device(s)         Comments: cane vs rollator     Hand Dominance   Dominant Hand: Right    Extremity/Trunk Assessment   Upper Extremity Assessment Upper Extremity Assessment: Generalized weakness    Lower Extremity Assessment Lower Extremity Assessment: Generalized weakness    Cervical / Trunk Assessment Cervical / Trunk Assessment:  Kyphotic  Communication   Communication: No difficulties  Cognition Arousal/Alertness: Awake/alert Behavior During Therapy: WFL for tasks assessed/performed Overall Cognitive Status: No family/caregiver present to determine  baseline cognitive functioning Area of Impairment: Orientation                 Orientation Level: Time;Place;Situation   Memory: Decreased short-term memory Following Commands: Follows one step commands consistently Safety/Judgement: Decreased awareness of safety;Decreased awareness of deficits Awareness: Anticipatory Problem Solving: Requires verbal cues        General Comments      Exercises     Assessment/Plan    PT Assessment Patient needs continued PT services  PT Problem List Decreased strength;Decreased range of motion;Decreased activity tolerance;Decreased balance;Decreased mobility;Decreased coordination;Decreased cognition;Decreased knowledge of use of DME;Decreased safety awareness;Decreased knowledge of precautions       PT Treatment Interventions DME instruction;Gait training;Stair training;Functional mobility training;Therapeutic activities;Therapeutic exercise;Balance training;Neuromuscular re-education;Cognitive remediation;Patient/family education    PT Goals (Current goals can be found in the Care Plan section)  Acute Rehab PT Goals Patient Stated Goal: feel better PT Goal Formulation: With patient Time For Goal Achievement: 04/24/17 Potential to Achieve Goals: Good    Frequency Min 3X/week   Barriers to discharge Decreased caregiver support      Co-evaluation               AM-PAC PT "6 Clicks" Daily Activity  Outcome Measure Difficulty turning over in bed (including adjusting bedclothes, sheets and blankets)?: A Little Difficulty moving from lying on back to sitting on the side of the bed? : Unable Difficulty sitting down on and standing up from a chair with arms (e.g., wheelchair, bedside commode, etc,.)?: A Lot Help needed moving to and from a bed to chair (including a wheelchair)?: A Little Help needed walking in hospital room?: A Little Help needed climbing 3-5 steps with a railing? : A Lot 6 Click Score: 14    End of Session  Equipment Utilized During Treatment: Gait belt Activity Tolerance: Patient tolerated treatment well Patient left: in bed;with call bell/phone within reach;with nursing/sitter in room Nurse Communication: Mobility status PT Visit Diagnosis: Unsteadiness on feet (R26.81);Muscle weakness (generalized) (M62.81);History of falling (Z91.81)    Time: 9373-4287 PT Time Calculation (min) (ACUTE ONLY): 17 min   Charges:   PT Evaluation $PT Eval Moderate Complexity: 1 Mod     PT G Codes:        Lorrin Goodell, PT  Office # [phone] Pager [phone]   Lorriane Shire 04/10/2017, 12:29 PM

## 2017-04-10 NOTE — Progress Notes (Signed)
Sitter at bedside.

## 2017-04-10 NOTE — Plan of Care (Signed)
  Education: Knowledge of General Education information will improve 04/10/2017 0300 - Progressing by Theador Hawthorne, RN   Clinical Measurements: Ability to maintain clinical measurements within normal limits will improve 04/10/2017 0300 - Progressing by Theador Hawthorne, RN   Activity: Risk for activity intolerance will decrease 04/10/2017 0300 - Progressing by Theador Hawthorne, RN   Nutrition: Adequate nutrition will be maintained 04/10/2017 0300 - Progressing by Theador Hawthorne, RN   Safety: Ability to remain free from injury will improve 04/10/2017 0300 - Progressing by Theador Hawthorne, RN   Skin Integrity: Risk for impaired skin integrity will decrease 04/10/2017 0300 - Progressing by Theador Hawthorne, RN

## 2017-04-10 NOTE — Progress Notes (Signed)
Results of swallow eval not back yet. RN unable to give scheduled oral medications until results seen. Will check back.

## 2017-04-10 NOTE — Progress Notes (Signed)
RN spoke with SLP. SLP recommends that pt not eat or take oral medications based on evaluation. MD notified.

## 2017-04-10 NOTE — Plan of Care (Signed)
  Progressing Activity: Risk for activity intolerance will decrease 04/10/2017 2208 - Progressing by Barton Dubois, RN Pt. Ambulates in the room with assistance.    Not Progressing Safety: Ability to remain free from injury will improve Pt. Has safety sitter at bedside. Pt. Is impulsive and High fall risk.  Pt. tries to get out of bed unassisted.  04/10/2017 2208 - Not Progressing by Barton Dubois, RN

## 2017-04-10 NOTE — Progress Notes (Signed)
Modified Barium Swallow Progress Note  Patient Details  Name: [name] MRN: [id number] Date of Birth: 23-May-1932  Today's Date: 04/10/2017  Modified Barium Swallow completed.  Full report located under Chart Review in the Imaging Section.  Brief recommendations include the following:  Clinical Impression  Pt exhibits moderate pharyngeal dysphagia resulting in laryngeal penetration and aspiration with all consistencies. Impairments encompass weakness and decreased timing and initiation of protective mechanisms. Decreased pharyngeal contraction, laryngeal elevation and incomplete epiglottic deflection led to penetration to vocal cords and eventual aspiration (silent) with ineffective chin tuck posture attempted. Maximum pyriform sinus and min-mild vallecuar retention post swallow not adequately reduced with cued second swallows. Penetrated during second swallow attempts intermittently. Lingual pumping and lingual residue present. Aspiration risk too high to recommend any safe consistency at present. He has history of dysphagia now exacerbated with current illness and multiple recent hospitalizations. Recommend NPO with short term alternative nutrition and continued ST.    Swallow Evaluation Recommendations       SLP Diet Recommendations: NPO       Medication Administration: Via alternative means               Oral Care Recommendations: Oral care QID        [name] 04/10/2017,2:15 PM   Orbie Pyo Leonard.Ed [employer] [phone]

## 2017-04-10 NOTE — Telephone Encounter (Signed)
Attempted to confirm remote transmission with pt. No answer and was unable to leave a message.  Automated message stating that pt is not accepting calls at this time.

## 2017-04-10 NOTE — Progress Notes (Addendum)
PROGRESS NOTE  SAMUAL [name] FSF:[id number] DOB: Jul 14, 1932 DOA: 04/07/2017 PCP: [name], MD  HPI/Recap of past 24 hours: [name] is a 84 y.o. male with medical history significant of afibnot on anticoagulation due to prior subdural hemorrhage, PE, hypertension, hyperlipidemia, diet-controlled diabetes, asthma, GERD, SDH, CAD, s/p of CABG and stent placement, sCHF with EF 35%, AICD placement, who presents with cough, chest pain and fever.  T-max 102.1.  Right lower lobe infiltrates. Admitted for HCAP complicated by influenza A infection.  04/08/17: seen and examined. Reports constipation. Bowel regimen added with sennokot. Denies dyspnea or chest pain. A&O x 3.States he is from home and lives alone.  04/09/17: Patient seen and examined at his bedside.  RN reports persistent cough.  Patient started on aggressive pulmonary toilet.  Patient denies dyspnea or chest pain or palpitations.  04/10/2017: Patient seen and examined at his bedside.  RN reports agitation and getting out of bed often with high risk of fall.  Sitter ordered at bedside.  Patient is alert and oriented to place only.  No focal motor deficits or speech deficits.   Assessment/Plan: Principal Problem:   Influenza A Active Problems:   Chronic atrial fibrillation (HCC)   Chronic systolic heart failure- EF 35-40%   CAD (coronary artery disease)   HCAP (healthcare-associated pneumonia)   Thrombocytopenia (HCC)   ICD (implantable cardioverter-defibrillator) in place   Sepsis ([place])   CKD (chronic kidney disease), stage III (HCC)   Acute respiratory failure with hypoxia (HCC)  Acute hypoxic respiratory failure 2/2 to HCAP  And Influenza A, poa -continue IV cefepime, day 4 out of 5 will DC cefepime in the morning. -continue tamiflu BID for total 5 days.  Day 4 out of 5 will DC Tamiflu in the morning. - passed home O2 evaluation on 04/08/2017 -Repeated Cxr done on 04/09/2017 revealed patchy infiltrate in the right  lower lobe with suspicion for pneumonitis. -Continue aggressive pulmonary toilet. -Duo nebs every 6 hours every 2 hours as needed hypersaline nebs 3 times daily chest PT Mucinex twice daily  Acute metabolic encephalopathy most likely multifactorial -Suspect underlying mild dementia versus infective process with hcap/influenza A -Reorient as needed -Sitter at bedside ordered today -No focal motor deficits or speech deficit  Acute on chronic dysphagia, worsening -Speech therapist recommended n.p.o. -Modified barium swallow evaluation ordered today, awaiting results. -N.p.o. until passes swallow evaluation -Monitor blood sugar closely every 4 hours when n.p.o.  Intractable cough most likely secondary to HCAP Versus influenza A, improving -Management as stated above  Chronic a-fib -not on [place] due to hx GI bleed -continue digoxin and metoprolol succi -rate controlled -continue monitoring  Hypokalemia, possibly secondary to diuretics -K+ 3.4 -repleted with po kcl and iV mag -Ordered BMP magnesium this morning awaiting results  Normocytic anemia -hg 12.5 from 11.6 -Baseline 13  Polyneuropathy -continue gabapentin  Constipation, resolved - continue Senokot BID -Had one bowel movement 04/08/2017 -monitor stool output  Chronic HFREF 35% post AICD -Continue cardiac meds -Lasix given today -Monitor urine output, I&Os, daily weight  Thrombocytopenia, improving -unclear etiology -no overt bleeding -plt 138 from 120 from 102k  Code Status: full  Family Communication: none at bedside   Disposition Plan: Patient refuses skilled nursing facility.  Home with home health services possibly tomorrow 04/11/2017   Consultants:  none  Procedures:  none  Antimicrobials:  IV cefepime Tamiflu  DVT prophylaxis:  SCDs   Objective: Vitals:   04/10/17 3343 04/10/17 0229 04/10/17 0454 04/10/17 0750  BP:   (!) 144/74   Pulse:   88   Resp:   16   Temp:   98.2 F (36.8 C)     TempSrc:   Oral   SpO2:  96% 93% 95%  Weight: 75.9 kg (167 lb 4.8 oz)     Height:        Intake/Output Summary (Last 24 hours) at 04/10/2017 1032 Last data filed at 04/10/2017 0930 Gross per 24 hour  Intake 440 ml  Output 1250 ml  Net -810 ml   Filed Weights   04/08/17 0559 04/09/17 0433 04/10/17 0226  Weight: 76 kg (167 lb 8 oz) 77.6 kg (171 lb 1.6 oz) 75.9 kg (167 lb 4.8 oz)    Exam: 04/10/2017.  Patient seen and examined.  Physical exam is unchanged from previous.  Except for mentioned below.   General:  84 yo CM WDWN NAD.  Alert and oriented x1.  Cardiovascular: IRR no rubs or gallops  Respiratory: Mild rales at bases.  No wheezes.  Abdomen: soft NT ND NBS x4   Musculoskeletal: non focal deficits   Skin: no rash  Psychiatry: Mood is appropriate for condition and setting.   Data Reviewed: CBC: Recent Labs  Lab 04/07/17 0236 04/08/17 1134 04/09/17 0317 04/10/17 0927  WBC 9.5 9.9 9.0 10.3  NEUTROABS 5.4  --   --   --   HGB 13.4 11.6* 11.5* 12.5*  HCT 41.9 35.8* 35.1* 37.2*  MCV 94.2 92.7 91.9 91.9  PLT 101* 102* 120* 937*   Basic Metabolic Panel: Recent Labs  Lab 04/04/17 0449 04/07/17 0236 04/08/17 1134 04/09/17 0317  NA 140 136 135 133*  K 3.5 4.0 3.4* 3.5  CL 107 100* 103 104  CO2 22 19* 22 19*  GLUCOSE 82 84 98 88  BUN 15 18 10 9   CREATININE 1.00 1.28* 0.92 0.80  CALCIUM 8.5* 8.2* 7.7* 7.5*  MG  --   --   --  1.7   GFR: Estimated Creatinine Clearance: 76.4 mL/min (by C-G formula based on SCr of 0.8 mg/dL). Liver Function Tests: Recent Labs  Lab 04/04/17 0449 04/07/17 0236  AST 24 33  ALT 11* 16*  ALKPHOS 46 50  BILITOT 1.6* 2.5*  PROT 5.9* 6.2*  ALBUMIN 3.1* 3.2*   No results for input(s): LIPASE, AMYLASE in the last 168 hours. No results for input(s): AMMONIA in the last 168 hours. Coagulation Profile: No results for input(s): INR, PROTIME in the last 168 hours. Cardiac Enzymes: No results for input(s): CKTOTAL, CKMB,  CKMBINDEX, TROPONINI in the last 168 hours. BNP (last 3 results) No results for input(s): PROBNP in the last 8760 hours. HbA1C: No results for input(s): HGBA1C in the last 72 hours. CBG: No results for input(s): GLUCAP in the last 168 hours. Lipid Profile: No results for input(s): CHOL, HDL, LDLCALC, TRIG, CHOLHDL, LDLDIRECT in the last 72 hours. Thyroid Function Tests: No results for input(s): TSH, T4TOTAL, FREET4, T3FREE, THYROIDAB in the last 72 hours. Anemia Panel: No results for input(s): VITAMINB12, FOLATE, FERRITIN, TIBC, IRON, RETICCTPCT in the last 72 hours. Urine analysis:    Component Value Date/Time   COLORURINE AMBER (A) 04/07/2017 0218   APPEARANCEUR HAZY (A) 04/07/2017 0218   LABSPEC 1.028 04/07/2017 0218   PHURINE 5.0 04/07/2017 0218   GLUCOSEU NEGATIVE 04/07/2017 0218   HGBUR SMALL (A) 04/07/2017 0218   BILIRUBINUR NEGATIVE 04/07/2017 0218   KETONESUR 20 (A) 04/07/2017 0218   PROTEINUR 100 (A) 04/07/2017 0218   UROBILINOGEN 0.2  09/09/2012 1501   NITRITE NEGATIVE 04/07/2017 0218   LEUKOCYTESUR NEGATIVE 04/07/2017 0218   Sepsis Labs: @LABRCNTIP (procalcitonin:4,lacticidven:4)  ) Recent Results (from the past 240 hour(s))  MRSA PCR Screening     Status: None   Collection Time: 04/04/17  1:21 AM  Result Value Ref Range Status   MRSA by PCR NEGATIVE NEGATIVE Final    Comment:        The GeneXpert MRSA Assay (FDA approved for NASAL specimens only), is one component of a comprehensive MRSA colonization surveillance program. It is not intended to diagnose MRSA infection nor to guide or monitor treatment for MRSA infections. Performed at [facility], [place] [address] [place], [place] [address]   Blood Culture (routine x 2)     Status: None (Preliminary result)   Collection Time: 04/07/17  2:20 AM  Result Value Ref Range Status   Specimen Description BLOOD RIGHT ANTECUBITAL  Final   Special Requests IN PEDIATRIC BOTTLE Blood Culture adequate volume   Final   Culture   Final    NO GROWTH 2 DAYS Performed at [facility], [place] [address], [place], [place] [address]    Report Status PENDING  Incomplete  Blood Culture (routine x 2)     Status: None (Preliminary result)   Collection Time: 04/07/17  2:35 AM  Result Value Ref Range Status   Specimen Description BLOOD RIGHT HAND  Final   Special Requests IN PEDIATRIC BOTTLE Blood Culture adequate volume  Final   Culture   Final    NO GROWTH 2 DAYS Performed at [facility], [place] [address], [place], [place] [address]    Report Status PENDING  Incomplete  Culture, Urine     Status: Abnormal   Collection Time: 04/07/17  6:06 PM  Result Value Ref Range Status   Specimen Description URINE, RANDOM  Final   Special Requests NONE  Final   Culture (A)  Final    <10,000 COLONIES/mL Performed at [facility], [place] [address] [place], [place] [address]    Report Status 04/09/2017 FINAL  Final  Culture, sputum-assessment     Status: None   Collection Time: 04/08/17 12:51 PM  Result Value Ref Range Status   Specimen Description SPUTUM  Final   Special Requests NONE  Final   Sputum evaluation   Final    Sputum specimen not acceptable for testing.  Please recollect.   Gram Stain Report Called to,Read Back By and Verified With: R ARYAL,RN AT 1507 04/08/17 BY L BENFIELD Performed at [facility], [place] [address], [place],  [address]    Report Status 04/08/2017 FINAL  Final      Studies: No results found.  Scheduled Meds: . albuterol  2.5 mg Nebulization BID  . dextromethorphan-guaiFENesin  1 tablet Oral BID  . digoxin  0.125 mg Oral Daily  . metoprolol succinate  25 mg Oral Daily  . oseltamivir  30 mg Oral BID  . senna-docusate  1 tablet Oral BID  . sodium chloride HYPERTONIC  4 mL Nebulization TID    Continuous Infusions: . ceFEPime (MAXIPIME) IV Stopped (04/10/17 0354)     LOS: 3 days     Kayleen Memos, MD [facility] Pager  [phone]  If 7PM-7AM, please contact night-coverage [email or web address] Password [facility] 04/10/2017, 10:32 AM

## 2017-04-10 NOTE — Progress Notes (Signed)
Pt. Assessed by Speech and language pathologist. Recommending pt. Be NPO short term with alternative nutrition. Per SLP, Aspiration risk too high to recommend any safe consistency at present. On call for TRH made aware to inquire about pts. Night time PO meds. RN instructed to hold all PRN meds for tonight until pt. In reevaluated by SLP.

## 2017-04-10 NOTE — Progress Notes (Signed)
Pt refused chair alarm belt. Pt chair alarm is on. Call light within pt's reach. Pt informed to call for assistance if he wants to get up. Pt states that he will not get up by himself.

## 2017-04-10 NOTE — Progress Notes (Signed)
Pt has left the bed 5 times. Bed alarm is on. Pt is now in the chair with chair alarm on. Pt would benefit from a sitter at bedside. MD notified.

## 2017-04-11 ENCOUNTER — Encounter: Payer: Self-pay | Admitting: *Deleted

## 2017-04-11 MED ORDER — MORPHINE SULFATE (PF) 2 MG/ML IV SOLN
1.0000 mg | INTRAVENOUS | Status: DC | PRN
  Administered 2017-04-11: 1 mg via INTRAVENOUS
  Filled 2017-04-11: qty 1

## 2017-04-11 MED ORDER — ACETAMINOPHEN 650 MG RE SUPP: 650.0000 mg | Freq: Four times a day (QID) | RECTAL | Status: DC | PRN

## 2017-04-11 NOTE — Progress Notes (Signed)
The writer spoke with the patient again about the importance of having NG tube in place to administer medications, hydration, and feeding. Patient refuses. Attempted to contact his son unsuccessfully. Asked RN to attempt to call family also.

## 2017-04-11 NOTE — Progress Notes (Signed)
  Speech Language Pathology Treatment: Dysphagia  Patient Details Name: [name] MRN: [id number] DOB: [birth year] Today's Date: 04/11/2017 Time: 6440-3474 SLP Time Calculation (min) (ACUTE ONLY): 26 min  Assessment / Plan / Recommendation Clinical Impression  Pt was seen for skilled ST targeting dysphagia goals.  Therapist provided thorough oral care via suction toothette prior to administering PO trials to minimize bacterial load and risk of infection from oral bacteria.  Oral mucosa is dry and pt complains of sore throat.  Per report from sitter, pt has been using chloraseptic spray for sore throat frequently this morning.  Pt had coughing at baseline which seemed to increase slightly with PO intake of ice chips, thin liquids via teaspoon, and puree.  Coughing was delayed, and given that pt is known to silently aspirate, these s/s are concerning for episodes of airway invasion.   Attempted education for return demonstration of chin tuck against resistance exercise for pharyngeal strengthening; however at this time pt needs max cues to complete 5 repetitions.   As a result, continue to recommend that pt remain NPO with short term alternative means of nutrition and aggressive ST follow up while inpatient.  Pt left in bed with sitter at bedside.    HPI HPI: Joshua Schewe Ludwickis a 84 y.o.malewith medical history significant ofafibnot on anticoagulation due to prior subdural hemorrhage,PE,hypertension, hyperlipidemia, diet-controlled diabetes, asthma, GERD, SDH, CAD,s/p ofCABGandstent placement,sCHFwith EF 35%, AICD placement, who presents with cough, chest pain and fever. T-max 102.1. Right lower lobe infiltrates. Admitted for HCAP complicated by influenza A infection. Pt has a history of mild oropharyngeal and cervical esophageal dysphagia per MBS 02/07/14; pharyngeal residuals noted across consistencies that liquid swallow help to clear but with resultant trace aspiration of liquids (thin  and nectar). Regular diet with thin liquids was recommended as pt's weight was stable and no PNA since 2013. MBS recommended to fully evaluate oropharyngeal swallow.       SLP Plan  Continue with current plan of care       Recommendations  Diet recommendations: NPO Medication Administration: Via alternative means Compensations: Slow rate;Small sips/bites                Oral Care Recommendations: Oral care QID;Oral care prior to ice chip/H20 Follow up Recommendations: Skilled Nursing facility SLP Visit Diagnosis: Dysphagia, oropharyngeal phase (R13.12) Plan: Continue with current plan of care       GO                Shermeka Rutt, Selinda Orion 04/11/2017, 8:52 AM

## 2017-04-11 NOTE — Clinical Social Work Note (Signed)
Clinical Social Work Assessment  Patient Details  Name: [name] MRN: [id number] Date of Birth: Jan 14, 1933  Date of referral:  04/11/17               Reason for consult:  Facility Placement, Discharge Planning                Permission sought to share information with:  Facility Art therapist granted to share information::     Name::        Agency::  SNF's  Relationship::     Contact Information:     Housing/Transportation Living arrangements for the past 2 months:  Apartment Source of Information:  Patient, Medical Team Patient Interpreter Needed:  None Criminal Activity/Legal Involvement Pertinent to Current Situation/Hospitalization:  No - Comment as needed Significant Relationships:  Adult Children, Friend Lives with:  Self Do you feel safe going back to the place where you live?  Yes Need for family participation in patient care:  Yes (Comment)  Care giving concerns:  PT recommending SNF once medically stable for discharge.   Social Worker assessment / plan:  CSW met with patient. Sitter at bedside. CSW introduced role and explained that PT recommendations would be discussed. CSW familiar with patient from last admission. Patient had refused SNF and [facility] with [facility]. Patient continues to prefer home health. He is agreeable to seeing what his SNF options are. SNF list provided. Patient currently has a sitter due to trying to get out of bed multiple times. Per RN in morning progression meeting, patient failed two speech evaluations and the speech therapist is recommending an NG tube. Will wait to send out referral until sitter is being dc'ed and diet determined to avoid denials. No further concerns. CSW encouraged patient to contact CSW as needed. CSW will continue to follow patient for support and facilitate discharge to SNF, if agreeable, once medically stable.  Employment status:  Retired Forensic scientist:  [employer] PT  Recommendations:  [place] / Referral to community resources:  [place]  Patient/Family's Response to care:  Patient prefers home health but is agreeable to [facility] sending out referral. Patient's son and friend supportive and involved in patient's care. Patient appreciated social work intervention.  Patient/Family's Understanding of and Emotional Response to Diagnosis, Current Treatment, and Prognosis:  Patient has a good understanding of the reason for admission and his need for continued therapy at discharge. Patient appears pleased with hospital care.  Emotional Assessment Appearance:  Appears stated age Attitude/Demeanor/Rapport:  Engaged Affect (typically observed):  Appropriate, Calm Orientation:  Oriented to Self, Oriented to Place, Oriented to  Time, Oriented to Situation Alcohol / Substance use:  Never Used Psych involvement (Current and /or in the community):  No (Comment)  Discharge Needs  Concerns to be addressed:  Care Coordination Readmission within the last 30 days:  Yes Current discharge risk:  Dependent with Mobility, Lives alone Barriers to Discharge:  Continued Medical Work up, Requiring sitter/restraints   Candie Chroman, LCSW 04/11/2017, 12:17 PM

## 2017-04-11 NOTE — Progress Notes (Signed)
PROGRESS NOTE  [name] YBO:[id number] DOB: 1932-05-14 DOA: 04/07/2017 PCP: [name], MD  HPI/Recap of past 24 hours: [name] is a 84 y.o. male with medical history significant of afibnot on anticoagulation due to prior subdural hemorrhage, PE, hypertension, hyperlipidemia, diet-controlled diabetes, asthma, GERD, SDH, CAD, s/p of CABG and stent placement, sCHF with EF 35%, AICD placement, who presents with cough, chest pain and fever.  T-max 102.1.  Right lower lobe infiltrates. Admitted for HCAP complicated by influenza A infection.  04/08/17: seen and examined. Reports constipation. Bowel regimen added with sennokot. Denies dyspnea or chest pain. A&O x 3.States he is from home and lives alone.  04/09/17: Patient seen and examined at his bedside.  RN reports persistent cough.  Patient started on aggressive pulmonary toilet.  Patient denies dyspnea or chest pain or palpitations.  04/10/2017: Patient seen and examined at his bedside.  RN reports agitation and getting out of bed often with high risk of fall.  Sitter ordered at bedside.  Patient is alert and oriented to place only.  No focal motor deficits or speech deficits.  04/11/17: Patient seen and examined at his bedside one-to-one sitter in the room.  The patient is alert but confused.  Repeated swallow evaluation by barium swallow revealed severe dysphagia.  Patient will remain n.p.o.  The writer attempted to call family members to obtain consent regarding placing an NG tube with no success.  RN will also attempt to contact family.  Needs NG tube to provide medication and hydration.   Assessment/Plan: Principal Problem:   Influenza A Active Problems:   Chronic atrial fibrillation (HCC)   Chronic systolic heart failure- EF 35-40%   CAD (coronary artery disease)   HCAP (healthcare-associated pneumonia)   Thrombocytopenia (HCC)   ICD (implantable cardioverter-defibrillator) in place   Sepsis ([place])   CKD (chronic  kidney disease), stage III (HCC)   Acute respiratory failure with hypoxia (HCC)  Acute hypoxic respiratory failure 2/2 to HCAP, Influenza A, and acute on chronic aspiration -Completed 5 days of IV cefepime. -Completed 5 days of Tamiflu. - passed home O2 evaluation on 04/08/2017 -Repeated Cxr done on 04/09/2017 revealed patchy infiltrate in the right lower lobe with suspicion for pneumonitis. -Continue aggressive pulmonary toilet. -Duo nebs every 6 hours every 2 hours as needed hypersaline nebs 3 times daily chest PT Mucinex twice daily  Acute metabolic encephalopathy most likely multifactorial -Suspect underlying dementia versus infective process with hcap/influenza A -Reorient as needed -Sitter at bedside ordered today -No focal motor deficits or speech deficits -Attempted to contact family members but unsuccessful  Acute on chronic dysphagia, worsening -Modified barium swallow evaluation 04/10/2017 -N.p.o. recommended by speech therapy 04/11/2017 -Monitor blood sugar closely every 4 hours when n.p.o. -Unable to reach family -We will order core track for medication and hydration  Intractable cough most likely secondary to HCAP Versus influenza A versus silent aspiration -Management as stated above  Chronic a-fib -not on [place] due to hx GI bleed -continue digoxin and metoprolol succi -rate controlled -continue monitoring  Hypokalemia, possibly secondary to diuretics -K+ 3.4 -repleted with po kcl and iV mag  Normocytic anemia -hg 12.5 from 11.6 -Baseline 13  Polyneuropathy -continue gabapentin  Constipation, resolved - continue Senokot BID -Had one bowel movement 04/08/2017 -monitor stool output  Chronic systolic HFREF 27% post AICD -Continue cardiac meds -Lasix given today -Monitor urine output, I&Os, daily weight  Thrombocytopenia, improving -unclear etiology -no overt bleeding -plt 138 from 120 from 102k  Code  Status: full  Family Communication: none at bedside     Disposition Plan: We will attempt to get skilled nursing facility however patient had refused in the past.  Attempting to contact family/find and medical power of attorney to make that decision.  Consultants:  Speech therapy  Social worker  Procedures:  Modified barium swallow eval  Antimicrobials: Completed 5 days of IV cefepime and IV Tamiflu  DVT prophylaxis:  SCDs   Objective: Vitals:   04/10/17 1944 04/10/17 2018 04/11/17 0607 04/11/17 0801  BP:  138/74 (!) 145/86   Pulse:  89 92   Resp:  18 18   Temp:  98.2 F (36.8 C) 97.7 F (36.5 C)   TempSrc:  Oral Oral   SpO2: 95% 92% 94% 94%  Weight:   74.8 kg (165 lb)   Height:        Intake/Output Summary (Last 24 hours) at 04/11/2017 1142 Last data filed at 04/11/2017 0614 Gross per 24 hour  Intake 200 ml  Output 200 ml  Net 0 ml   Filed Weights   04/09/17 0433 04/10/17 0226 04/11/17 0607  Weight: 77.6 kg (171 lb 1.6 oz) 75.9 kg (167 lb 4.8 oz) 74.8 kg (165 lb)    Exam: 04/11/2017.  Patient seen and examined.  Physical exam is unchanged from previous.  Except for mentioned below.   General:  84 yo CM WDWN NAD.  Alert and oriented x1.  Cardiovascular: IRR no rubs or gallops  Respiratory: Mild rales at bases.  No wheezes.  Abdomen: soft NT ND NBS x4   Musculoskeletal: non focal deficits   Skin: no rash  Psychiatry: Mood is appropriate for condition and setting.   Data Reviewed: CBC: Recent Labs  Lab 04/07/17 0236 04/08/17 1134 04/09/17 0317 04/10/17 0927  WBC 9.5 9.9 9.0 10.3  NEUTROABS 5.4  --   --   --   HGB 13.4 11.6* 11.5* 12.5*  HCT 41.9 35.8* 35.1* 37.2*  MCV 94.2 92.7 91.9 91.9  PLT 101* 102* 120* 295*   Basic Metabolic Panel: Recent Labs  Lab 04/07/17 0236 04/08/17 1134 04/09/17 0317 04/10/17 0927  NA 136 135 133* 138  K 4.0 3.4* 3.5 3.4*  CL 100* 103 104 106  CO2 19* 22 19* 19*  GLUCOSE 84 98 88 89  BUN 18 10 9 10   CREATININE 1.28* 0.92 0.80 0.96  CALCIUM 8.2* 7.7*  7.5* 8.2*  MG  --   --  1.7  --    GFR: Estimated Creatinine Clearance: 62.8 mL/min (by C-G formula based on SCr of 0.96 mg/dL). Liver Function Tests: Recent Labs  Lab 04/07/17 0236  AST 33  ALT 16*  ALKPHOS 50  BILITOT 2.5*  PROT 6.2*  ALBUMIN 3.2*   No results for input(s): LIPASE, AMYLASE in the last 168 hours. No results for input(s): AMMONIA in the last 168 hours. Coagulation Profile: No results for input(s): INR, PROTIME in the last 168 hours. Cardiac Enzymes: No results for input(s): CKTOTAL, CKMB, CKMBINDEX, TROPONINI in the last 168 hours. BNP (last 3 results) No results for input(s): PROBNP in the last 8760 hours. HbA1C: No results for input(s): HGBA1C in the last 72 hours. CBG: No results for input(s): GLUCAP in the last 168 hours. Lipid Profile: No results for input(s): CHOL, HDL, LDLCALC, TRIG, CHOLHDL, LDLDIRECT in the last 72 hours. Thyroid Function Tests: No results for input(s): TSH, T4TOTAL, FREET4, T3FREE, THYROIDAB in the last 72 hours. Anemia Panel: No results for input(s): VITAMINB12, FOLATE, FERRITIN, TIBC,  IRON, RETICCTPCT in the last 72 hours. Urine analysis:    Component Value Date/Time   COLORURINE AMBER (A) 04/07/2017 0218   APPEARANCEUR HAZY (A) 04/07/2017 0218   LABSPEC 1.028 04/07/2017 0218   PHURINE 5.0 04/07/2017 0218   GLUCOSEU NEGATIVE 04/07/2017 0218   HGBUR SMALL (A) 04/07/2017 0218   BILIRUBINUR NEGATIVE 04/07/2017 0218   KETONESUR 20 (A) 04/07/2017 0218   PROTEINUR 100 (A) 04/07/2017 0218   UROBILINOGEN 0.2 09/09/2012 1501   NITRITE NEGATIVE 04/07/2017 0218   LEUKOCYTESUR NEGATIVE 04/07/2017 0218   Sepsis Labs: @LABRCNTIP (procalcitonin:4,lacticidven:4)  ) Recent Results (from the past 240 hour(s))  MRSA PCR Screening     Status: None   Collection Time: 04/04/17  1:21 AM  Result Value Ref Range Status   MRSA by PCR NEGATIVE NEGATIVE Final    Comment:        The GeneXpert MRSA Assay (FDA approved for NASAL  specimens only), is one component of a comprehensive MRSA colonization surveillance program. It is not intended to diagnose MRSA infection nor to guide or monitor treatment for MRSA infections. Performed at [facility], [place] [address] [place], [place] [address]   Blood Culture (routine x 2)     Status: None (Preliminary result)   Collection Time: 04/07/17  2:20 AM  Result Value Ref Range Status   Specimen Description BLOOD RIGHT ANTECUBITAL  Final   Special Requests IN PEDIATRIC BOTTLE Blood Culture adequate volume  Final   Culture   Final    NO GROWTH 3 DAYS Performed at [facility], [place] [address], [place], [place] [address]    Report Status PENDING  Incomplete  Blood Culture (routine x 2)     Status: None (Preliminary result)   Collection Time: 04/07/17  2:35 AM  Result Value Ref Range Status   Specimen Description BLOOD RIGHT HAND  Final   Special Requests IN PEDIATRIC BOTTLE Blood Culture adequate volume  Final   Culture   Final    NO GROWTH 3 DAYS Performed at [facility], [place] [address] [place], [place] [address]    Report Status PENDING  Incomplete  Culture, Urine     Status: Abnormal   Collection Time: 04/07/17  6:06 PM  Result Value Ref Range Status   Specimen Description URINE, RANDOM  Final   Special Requests NONE  Final   Culture (A)  Final    <10,000 COLONIES/mL Performed at [facility], [place] [address] [place], [place] [address]    Report Status 04/09/2017 FINAL  Final  Culture, sputum-assessment     Status: None   Collection Time: 04/08/17 12:51 PM  Result Value Ref Range Status   Specimen Description SPUTUM  Final   Special Requests NONE  Final   Sputum evaluation   Final    Sputum specimen not acceptable for testing.  Please recollect.   Gram Stain Report Called to,Read Back By and Verified With: R ARYAL,RN AT 1507 04/08/17 BY L BENFIELD Performed at [facility], [place] [address] [place], [place] [address]    Report  Status 04/08/2017 FINAL  Final      Studies: No results found.  Scheduled Meds: . albuterol  2.5 mg Nebulization BID  . dextromethorphan-guaiFENesin  1 tablet Oral BID  . digoxin  0.125 mg Oral Daily  . metoprolol succinate  25 mg Oral Daily  . oseltamivir  75 mg Oral BID  . senna-docusate  1 tablet Oral BID  .  sodium chloride HYPERTONIC  4 mL Nebulization TID    Continuous Infusions: . ceFEPime (MAXIPIME) IV Stopped (04/11/17 1137)     LOS: 4 days     Kayleen Memos, MD [facility] Pager [phone]  If 7PM-7AM, please contact night-coverage [email or web address] Password [facility] 04/11/2017, 11:42 AM

## 2017-04-11 NOTE — Progress Notes (Signed)
Pt. Requesting medication for back pain. On call for [facility], Tylene Fantasia, NP, Paged to make aware. New orders received and implemented.

## 2017-04-11 NOTE — Progress Notes (Signed)
Patient has order for NG tube placement due to failing swallowing evaluation twice.  Nurse explained procedure to patient before attempting to insert the NG tube.  Patient refused the NG tube stating "let me talk to the doctor first, I don't want that in my nose."  Nurse explained to the patient that this is needed for him to take his medicine and get hydration.  Patient stated he still wanted to talk to the doctor first.  MD notified. Marcille Blanco, RN

## 2017-04-12 ENCOUNTER — Encounter: Payer: Self-pay | Admitting: Licensed Clinical Social Worker

## 2017-04-12 ENCOUNTER — Other Ambulatory Visit: Payer: Self-pay | Admitting: Licensed Clinical Social Worker

## 2017-04-12 DIAGNOSIS — N179 Acute kidney failure, unspecified: Secondary | ICD-10-CM

## 2017-04-12 DIAGNOSIS — I482 Chronic atrial fibrillation: Secondary | ICD-10-CM

## 2017-04-12 DIAGNOSIS — I5022 Chronic systolic (congestive) heart failure: Secondary | ICD-10-CM

## 2017-04-12 DIAGNOSIS — Z951 Presence of aortocoronary bypass graft: Secondary | ICD-10-CM

## 2017-04-12 DIAGNOSIS — K219 Gastro-esophageal reflux disease without esophagitis: Secondary | ICD-10-CM

## 2017-04-12 DIAGNOSIS — R55 Syncope and collapse: Secondary | ICD-10-CM

## 2017-04-12 DIAGNOSIS — E785 Hyperlipidemia, unspecified: Secondary | ICD-10-CM

## 2017-04-12 DIAGNOSIS — Z95810 Presence of automatic (implantable) cardiac defibrillator: Secondary | ICD-10-CM

## 2017-04-12 DIAGNOSIS — I2510 Atherosclerotic heart disease of native coronary artery without angina pectoris: Secondary | ICD-10-CM

## 2017-04-12 DIAGNOSIS — D509 Iron deficiency anemia, unspecified: Secondary | ICD-10-CM

## 2017-04-12 DIAGNOSIS — E119 Type 2 diabetes mellitus without complications: Secondary | ICD-10-CM

## 2017-04-12 DIAGNOSIS — I2782 Chronic pulmonary embolism: Secondary | ICD-10-CM

## 2017-04-12 DIAGNOSIS — W19XXXA Unspecified fall, initial encounter: Secondary | ICD-10-CM

## 2017-04-12 DIAGNOSIS — D696 Thrombocytopenia, unspecified: Secondary | ICD-10-CM

## 2017-04-12 DIAGNOSIS — M150 Primary generalized (osteo)arthritis: Secondary | ICD-10-CM

## 2017-04-12 DIAGNOSIS — E041 Nontoxic single thyroid nodule: Secondary | ICD-10-CM

## 2017-04-12 DIAGNOSIS — Z9181 History of falling: Secondary | ICD-10-CM

## 2017-04-12 DIAGNOSIS — J45909 Unspecified asthma, uncomplicated: Secondary | ICD-10-CM

## 2017-04-12 DIAGNOSIS — Z955 Presence of coronary angioplasty implant and graft: Secondary | ICD-10-CM

## 2017-04-12 DIAGNOSIS — I110 Hypertensive heart disease with heart failure: Secondary | ICD-10-CM

## 2017-04-12 LAB — CULTURE, BLOOD (ROUTINE X 2)
Culture: NO GROWTH
Culture: NO GROWTH
SPECIAL REQUESTS: ADEQUATE
SPECIAL REQUESTS: ADEQUATE

## 2017-04-12 LAB — GLUCOSE, CAPILLARY
GLUCOSE-CAPILLARY: 72 mg/dL (ref 65–99)
Glucose-Capillary: 79 mg/dL (ref 65–99)
Glucose-Capillary: 79 mg/dL (ref 65–99)
Glucose-Capillary: 84 mg/dL (ref 65–99)

## 2017-04-12 MED ORDER — IPRATROPIUM-ALBUTEROL 0.5-2.5 (3) MG/3ML IN SOLN: 3.0000 mL | RESPIRATORY_TRACT | Status: DC | PRN

## 2017-04-12 NOTE — Patient Outreach (Signed)
Assessment:  CSW spoke via phone with Joshua Coho RN regarding client needs and status.  [name] reported to CSW that she had talked with RN at [facility] and that [name] was assigning client to their [name] program in nursing with [name] services. As such, [name] was informed by Joshua Lemmings RN that client would be discharged from all [name] program services and would be under care of [name] program. Joshua Lemmings RN informed [name] that when [name] program was completed with client that [name] would then have the option of referring or not referring client back to [name] program services. Thus, Joshua Coho RN informed CSW that she would discharge client from [name] Thus, CSW is discharging client from [name] on 04/12/17 since client is now under care with [name] program.  Plan:  CSW is discharging client on 04/12/17 from [name] services since client is now receiving care from [name] program.  CSW to inform [name], that [name] discharged client from [name] services on 04/12/17.  CSW to fax physician case closure letter to [name] informing [name] that [name] discharged client on 04/12/17 from [name] CSW services.  [name] MSW, [name] Care Management [phone]

## 2017-04-12 NOTE — Progress Notes (Signed)
Physical Therapy Treatment Patient Details Name: [name] MRN: [id number] DOB: 05/05/1932 Today's Date: 04/12/2017    History of Present Illness 84 y.o. male with medical history significant of afib not on anticoagulation due to prior subdural hemorrhage, PE, hypertension, hyperlipidemia, diet-controlled diabetes, asthma, GERD, SDH, CAD, s/p of CABG and stent placement, sCHF with EF 35%, AICD placement, who presents with cough, chest pain and fever.  Admitted for HCAP complicated by influenza A infection.    PT Comments    Pt performed gait training and functional mobility and remains to require min assistance at this time.  Plan for SNF remains appropriate.  Continues to progress at a slower pace due to weakness and illness durign hospitalization.    Follow Up Recommendations  SNF;Supervision/Assistance - 24 hour     Equipment Recommendations  None recommended by PT    Recommendations for Other Services OT consult     Precautions / Restrictions Precautions Precautions: Fall Precaution Comments: Hx of falls at home  Restrictions Weight Bearing Restrictions: No LUE Weight Bearing: Non weight bearing    Mobility  Bed Mobility Overal bed mobility: Needs Assistance Bed Mobility: Supine to Sit     Supine to sit: Min assist;HOB elevated     General bed mobility comments: +rail, assist elevating trunk sup to sit, assist with BLE sit to sup  Transfers Overall transfer level: Needs assistance Equipment used: Rolling walker (2 wheeled) Transfers: Sit to/from Stand Sit to Stand: Min assist         General transfer comment: verbal cues for hand placement. Assist to power up.   Ambulation/Gait Ambulation/Gait assistance: Min guard Ambulation Distance (Feet): 100 Feet Assistive device: Rolling walker (2 wheeled)   Gait velocity: Decreased Gait velocity interpretation: Below normal speed for age/gender General Gait Details: Slow, shuffle gait. No LOB. Poor  activity tolerace. Required x1 standing break in which he leaned against the wall.  Pt SPo2 decreased to 84% on RA but when returned to seated position and rested post gait resolved to 92% on RA.     Stairs            Wheelchair Mobility    Modified Rankin (Stroke Patients Only)       Balance Overall balance assessment: Needs assistance   Sitting balance-Leahy Scale: Good       Standing balance-Leahy Scale: Fair Standing balance comment: Pt needs use of UEs on walker for mobility but could stand statically with supervision during grooming tasks using BUEs.                             Cognition Arousal/Alertness: Awake/alert Behavior During Therapy: WFL for tasks assessed/performed Overall Cognitive Status: No family/caregiver present to determine baseline cognitive functioning                                        Exercises      General Comments        Pertinent Vitals/Pain Pain Assessment: No/denies pain    Home Living                      Prior Function            PT Goals (current goals can now be found in the care plan section) Acute Rehab PT Goals Patient Stated Goal: feel better Potential to Achieve  Goals: Good Progress towards PT goals: Progressing toward goals    Frequency    Min 3X/week      PT Plan Current plan remains appropriate    Co-evaluation              AM-PAC PT "6 Clicks" Daily Activity  Outcome Measure  Difficulty turning over in bed (including adjusting bedclothes, sheets and blankets)?: A Little Difficulty moving from lying on back to sitting on the side of the bed? : Unable Difficulty sitting down on and standing up from a chair with arms (e.g., wheelchair, bedside commode, etc,.)?: A Lot Help needed moving to and from a bed to chair (including a wheelchair)?: A Little Help needed walking in hospital room?: A Little Help needed climbing 3-5 steps with a railing? : A Lot 6  Click Score: 14    End of Session Equipment Utilized During Treatment: Gait belt Activity Tolerance: Patient tolerated treatment well Patient left: in bed;with call bell/phone within reach;with nursing/sitter in room Nurse Communication: Mobility status PT Visit Diagnosis: Unsteadiness on feet (R26.81);Muscle weakness (generalized) (M62.81);History of falling (Z91.81)     Time: 5465-0354 PT Time Calculation (min) (ACUTE ONLY): 14 min  Charges:  $Gait Training: 8-22 mins                    G Codes:       [name], PTA pager [phone]    [name] 04/12/2017, 4:50 PM

## 2017-04-12 NOTE — Progress Notes (Signed)
  Speech Language Pathology Treatment: Dysphagia  Patient Details Name: [name] MRN: [id number] DOB: [birth year] Today's Date: 04/12/2017 Time: 3009-2330 SLP Time Calculation (min) (ACUTE ONLY): 22 min  Assessment / Plan / Recommendation Clinical Impression  Pt was seen for skilled ST targeting dysphagia goals.  SLP facilitated the session with trials of ice chips and thin liquids via teaspoon following oral care to continue working towards PO diet.  Pt again presents with increased coughing during limited trials and had increased expectoration of mucus and residual thins with complaints of boluses "not wanting to go down" and ice chips getting "hung at the back of my throat" when compared to yesterday's therapy session.  The above deficits appear consistent with most recent MBS and an acute on chronic pharyngoesophageal dysphagia.  Continue to recommend short term alternative means of nutrition as pt does not appear safe for a PO diet at this time.   HPI HPI: Joshua Bozzi Ludwickis a 84 y.o.malewith medical history significant ofafibnot on anticoagulation due to prior subdural hemorrhage,PE,hypertension, hyperlipidemia, diet-controlled diabetes, asthma, GERD, SDH, CAD,s/p ofCABGandstent placement,sCHFwith EF 35%, AICD placement, who presents with cough, chest pain and fever. T-max 102.1. Right lower lobe infiltrates. Admitted for HCAP complicated by influenza A infection. Pt has a history of mild oropharyngeal and cervical esophageal dysphagia per MBS 02/07/14; pharyngeal residuals noted across consistencies that liquid swallow help to clear but with resultant trace aspiration of liquids (thin and nectar). Regular diet with thin liquids was recommended as pt's weight was stable and no PNA since 2013. MBS recommended to fully evaluate oropharyngeal swallow.       SLP Plan  Continue with current plan of care       Recommendations  Diet recommendations: NPO Medication  Administration: Via alternative means Compensations: Slow rate;Small sips/bites                Oral Care Recommendations: Oral care QID Follow up Recommendations: Skilled Nursing facility SLP Visit Diagnosis: Dysphagia, oropharyngeal phase (R13.12) Plan: Continue with current plan of care       GO                PageSelinda Orion 04/12/2017, 8:46 AM

## 2017-04-12 NOTE — Progress Notes (Addendum)
Patient to remain on droplet precaution per infection prevention team

## 2017-04-12 NOTE — Progress Notes (Signed)
Initial Nutrition Assessment  DOCUMENTATION CODES:   Non-severe (moderate) malnutrition in context of chronic illness  INTERVENTION:   -When feeding access is established, recommend:  Initiate Jevity 1.5 @ 20 ml/hr and increase by 10 ml every 8 hours to goal rate of 50 ml/hr.   30 ml Prostat daily.    Recommend 150 ml free water flush every 4 hours  Tube feeding regimen provides 1900 kcal (100% of needs), 92 grams of protein, and 912 ml of H2O. (Total free water: 1812 ml)  NUTRITION DIAGNOSIS:   Moderate Malnutrition related to chronic illness(CHF, SDH) as evidenced by mild fat depletion, moderate fat depletion, mild muscle depletion, moderate muscle depletion, percent weight loss.  GOAL:   Patient will meet greater than or equal to 90% of their needs  MONITOR:   Diet advancement, Labs, Weight trends, TF tolerance, Skin, I & O's  REASON FOR ASSESSMENT:   Consult Enteral/tube feeding initiation and management  ASSESSMENT:   [name] is a 84 y.o. male with medical history significant of afib not on anticoagulation due to prior subdural hemorrhage, PE, hypertension, hyperlipidemia, diet-controlled diabetes, asthma, GERD, SDH, CAD, s/p of CABG and stent placement, sCHF with EF 35%, AICD placement, who presents with cough, chest pain or fever.  Pt admitted with acute respiratory failure with hypoxia due to influenza A and sepsis.   3/10- s/p BSE- recommend NPO 3/11- s/p MBSS- recommend continue NPO 3/12- s/p repeat BSE- recommend continued NPO with nutrition via alternative needs  Pt very somnolent at time of visit. Spoke with Air cabin crew at bedside, who reports SLP assessed earlier this morning. Pt did not arise for either interview or exam.   Reviewed wt hx; noted pt has experienced a 13.6% wt loss over the past 3 months, which is significant for time frame.  Per nursing notes, pt has refused attempts at placing NGT; attempting to contact family. Pt with no  current feeding access. MD recommending cortrak tube, however, no order currently in place. Cortrak service is not on service today, however, tube team will be available for placement on 04/13/17, 04/14/17, and 04/15/17 if cortrak tube is desired.   Labs reviewed: CBGS WDL. K: 3.4.   NUTRITION - FOCUSED PHYSICAL EXAM:    Most Recent Value  Orbital Region  Moderate depletion  Upper Arm Region  Mild depletion  Thoracic and Lumbar Region  No depletion  Buccal Region  Mild depletion  Temple Region  Moderate depletion  Clavicle Bone Region  Mild depletion  Clavicle and Acromion Bone Region  Mild depletion  Scapular Bone Region  Mild depletion  Dorsal Hand  Mild depletion  Patellar Region  Moderate depletion  Anterior Thigh Region  Mild depletion  Posterior Calf Region  Mild depletion  Edema (RD Assessment)  Mild  Hair  Reviewed  Eyes  Reviewed  Mouth  Reviewed  Skin  Reviewed  Nails  Reviewed       Diet Order:  Fall precautions Diet NPO time specified  EDUCATION NEEDS:   Not appropriate for education at this time  Skin:  Skin Assessment: Reviewed RN Assessment  Last BM:  04/11/17  Height:   Ht Readings from Last 1 Encounters:  04/07/17 6\' 1"  (1.854 m)    Weight:   Wt Readings from Last 1 Encounters:  04/11/17 165 lb (74.8 kg)    Ideal Body Weight:  83.6 kg  BMI:  Body mass index is 21.77 kg/m.  Estimated Nutritional Needs:   Kcal:  1850-2050  Protein:  90-105 grams  Fluid:  1.8-2.0 L    Branch Pacitti A. Jimmye Norman, RD, LDN, CDE Pager: [phone] After hours Pager: [phone]

## 2017-04-12 NOTE — Progress Notes (Signed)
Informed by CCMD patient in Afib with RVR rate at 150s. Rate decreased 100-120s Afib.EKG done and MD made aware

## 2017-04-12 NOTE — Progress Notes (Signed)
PROGRESS NOTE  [name] HUD:[id number] DOB: Feb 24, 1932 DOA: 04/07/2017 PCP: [name], MD  HPI/Recap of past 24 hours: [name] is a 84 y.o. male with medical history significant of afibnot on anticoagulation due to prior subdural hemorrhage, PE, hypertension, hyperlipidemia, diet-controlled diabetes, asthma, GERD, SDH, CAD, s/p of CABG and stent placement, sCHF with EF 35%, AICD placement, who presents with cough, chest pain and fever.  T-max 102.1.  Right lower lobe infiltrates. Admitted for HCAP complicated by influenza A infection.  04/08/17: seen and examined. Reports constipation. Bowel regimen added with sennokot. Denies dyspnea or chest pain. A&O x 3.States he is from home and lives alone.  04/09/17: Patient seen and examined at his bedside.  RN reports persistent cough.  Patient started on aggressive pulmonary toilet.  Patient denies dyspnea or chest pain or palpitations.  04/10/2017: Patient seen and examined at his bedside.  RN reports agitation and getting out of bed often with high risk of fall.  Sitter ordered at bedside.  Patient is alert and oriented to place only.  No focal motor deficits or speech deficits.  04/11/17: Patient seen and examined at his bedside one-to-one sitter in the room.  The patient is alert but confused.  Repeated swallow evaluation by barium swallow revealed severe dysphagia.  Patient will remain n.p.o.  The writer attempted to call family members to obtain consent regarding placing an NG tube with no success.  RN will also attempt to contact family.  Needs NG tube to provide medication and hydration.  04/12/2017: Patient seen and examined at his bedside with one-to-one sitter in the room.  Patient initially is agreeable to getting NG tube placement.  Later RN called stating that the patient refuses NG tube placement.  Attempted to call family, his son, but unsuccessfully.  Patient has no new complaints.  He is alert and oriented  x1.   Assessment/Plan: Principal Problem:   Influenza A Active Problems:   Chronic atrial fibrillation (HCC)   Chronic systolic heart failure- EF 35-40%   CAD (coronary artery disease)   HCAP (healthcare-associated pneumonia)   Thrombocytopenia (HCC)   ICD (implantable cardioverter-defibrillator) in place   Sepsis ([place])   CKD (chronic kidney disease), stage III (HCC)   Acute respiratory failure with hypoxia (HCC)  Acute hypoxic respiratory failure 2/2 to HCAP, Influenza A, and acute on chronic aspiration -Completed 5 days of IV cefepime. -Completed 5 days of Tamiflu. - passed home O2 evaluation on 04/08/2017 -Repeated Cxr done on 04/09/2017 revealed patchy infiltrate in the right lower lobe with suspicion for pneumonitis. -Continue aggressive pulmonary toilet. -Duo nebs every 6 hours every 2 hours as needed hypersaline nebs 3 times daily chest PT Mucinex twice daily  Acute metabolic encephalopathy most likely multifactorial -Suspect underlying dementia versus infective process with hcap/influenza A -Reorient as needed -Sitter at bedside ordered today -No focal motor deficits or speech deficits -Attempted to contact family members but unsuccessful  Acute on chronic severe dysphagia, worsening -Modified barium swallow evaluation 04/10/2017 -N.p.o. recommended by speech therapy 04/11/2017 -Monitor blood sugar closely every 4 hours when n.p.o. -Unable to reach family -We will order core track for medication and hydration -Patient refuses NG tube placement -N.p.o.  Intractable cough most likely secondary to HCAP Versus influenza A versus silent aspiration -Management as stated above  Chronic a-fib -not on [place] due to hx GI bleed -continue digoxin and metoprolol succi -rate controlled -continue monitoring  Hypokalemia, possibly secondary to diuretics -K+ 3.4 -repleted with po kcl  and iV mag  Normocytic anemia -hg 12.5 from 11.6 -Baseline 13  Polyneuropathy -continue  gabapentin  Constipation, resolved - continue Senokot BID -Had one bowel movement 04/08/2017 -monitor stool output  Chronic systolic HFREF 40% post AICD -Continue cardiac meds -Lasix given today -Monitor urine output, I&Os, daily weight  Thrombocytopenia, improving -unclear etiology -no overt bleeding -plt 138 from 120 from 102k  Code Status: full  Family Communication: none at bedside   Disposition Plan: We will attempt to get skilled nursing facility however patient had refused in the past.  Attempting to contact family/find and medical power of attorney to make that decision.  Consultants:  Speech therapy  Social worker  Procedures:  Modified barium swallow eval  Antimicrobials: Completed 5 days of IV cefepime and IV Tamiflu  DVT prophylaxis:  SCDs   Objective: Vitals:   04/11/17 2012 04/12/17 0614 04/12/17 0750 04/12/17 1108  BP:  (!) 147/80  (!) 156/79  Pulse: 86 96  (!) 107  Resp: 18 20  20   Temp:  (!) 97.5 F (36.4 C)  98 F (36.7 C)  TempSrc:  Axillary  Oral  SpO2: 99% 94% 95% 99%  Weight:      Height:        Intake/Output Summary (Last 24 hours) at 04/12/2017 1438 Last data filed at 04/12/2017 0500 Gross per 24 hour  Intake 0 ml  Output -  Net 0 ml   Filed Weights   04/09/17 0433 04/10/17 0226 04/11/17 0607  Weight: 77.6 kg (171 lb 1.6 oz) 75.9 kg (167 lb 4.8 oz) 74.8 kg (165 lb)    Exam: 04/12/2017.  Patient seen and examined.  Physical exam is unchanged from previous.  Except for mentioned below.   General: 84 year old Caucasian male well-developed well-nourished sitting up in bed in no acute distress.  Breathing comfortably on ambient air.  Alert and oriented x1.   Cardiovascular: Irregular rate and rhythm with no rubs or gallops. Respiratory: Mild rales at bases.  No wheezes.  Abdomen: soft NT ND NBS x4   Musculoskeletal: non focal deficits   Skin: no rash  Psychiatry: Mood is appropriate for condition and setting.   Data  Reviewed: CBC: Recent Labs  Lab 04/07/17 0236 04/08/17 1134 04/09/17 0317 04/10/17 0927  WBC 9.5 9.9 9.0 10.3  NEUTROABS 5.4  --   --   --   HGB 13.4 11.6* 11.5* 12.5*  HCT 41.9 35.8* 35.1* 37.2*  MCV 94.2 92.7 91.9 91.9  PLT 101* 102* 120* 086*   Basic Metabolic Panel: Recent Labs  Lab 04/07/17 0236 04/08/17 1134 04/09/17 0317 04/10/17 0927  NA 136 135 133* 138  K 4.0 3.4* 3.5 3.4*  CL 100* 103 104 106  CO2 19* 22 19* 19*  GLUCOSE 84 98 88 89  BUN 18 10 9 10   CREATININE 1.28* 0.92 0.80 0.96  CALCIUM 8.2* 7.7* 7.5* 8.2*  MG  --   --  1.7  --    GFR: Estimated Creatinine Clearance: 62.8 mL/min (by C-G formula based on SCr of 0.96 mg/dL). Liver Function Tests: Recent Labs  Lab 04/07/17 0236  AST 33  ALT 16*  ALKPHOS 50  BILITOT 2.5*  PROT 6.2*  ALBUMIN 3.2*   No results for input(s): LIPASE, AMYLASE in the last 168 hours. No results for input(s): AMMONIA in the last 168 hours. Coagulation Profile: No results for input(s): INR, PROTIME in the last 168 hours. Cardiac Enzymes: No results for input(s): CKTOTAL, CKMB, CKMBINDEX, TROPONINI in the last 168  hours. BNP (last 3 results) No results for input(s): PROBNP in the last 8760 hours. HbA1C: No results for input(s): HGBA1C in the last 72 hours. CBG: Recent Labs  Lab 04/12/17 0520 04/12/17 1342  GLUCAP 79 72   Lipid Profile: No results for input(s): CHOL, HDL, LDLCALC, TRIG, CHOLHDL, LDLDIRECT in the last 72 hours. Thyroid Function Tests: No results for input(s): TSH, T4TOTAL, FREET4, T3FREE, THYROIDAB in the last 72 hours. Anemia Panel: No results for input(s): VITAMINB12, FOLATE, FERRITIN, TIBC, IRON, RETICCTPCT in the last 72 hours. Urine analysis:    Component Value Date/Time   COLORURINE AMBER (A) 04/07/2017 0218   APPEARANCEUR HAZY (A) 04/07/2017 0218   LABSPEC 1.028 04/07/2017 0218   PHURINE 5.0 04/07/2017 0218   GLUCOSEU NEGATIVE 04/07/2017 0218   HGBUR SMALL (A) 04/07/2017 0218    BILIRUBINUR NEGATIVE 04/07/2017 0218   KETONESUR 20 (A) 04/07/2017 0218   PROTEINUR 100 (A) 04/07/2017 0218   UROBILINOGEN 0.2 09/09/2012 1501   NITRITE NEGATIVE 04/07/2017 0218   LEUKOCYTESUR NEGATIVE 04/07/2017 0218   Sepsis Labs: @LABRCNTIP (procalcitonin:4,lacticidven:4)  ) Recent Results (from the past 240 hour(s))  MRSA PCR Screening     Status: None   Collection Time: 04/04/17  1:21 AM  Result Value Ref Range Status   MRSA by PCR NEGATIVE NEGATIVE Final    Comment:        The GeneXpert MRSA Assay (FDA approved for NASAL specimens only), is one component of a comprehensive MRSA colonization surveillance program. It is not intended to diagnose MRSA infection nor to guide or monitor treatment for MRSA infections. Performed at [facility], [place] [address] [place], [place] [address]   Blood Culture (routine x 2)     Status: None (Preliminary result)   Collection Time: 04/07/17  2:20 AM  Result Value Ref Range Status   Specimen Description BLOOD RIGHT ANTECUBITAL  Final   Special Requests IN PEDIATRIC BOTTLE Blood Culture adequate volume  Final   Culture   Final    NO GROWTH 4 DAYS Performed at [facility], [place] [address], [place], [place] [address]    Report Status PENDING  Incomplete  Blood Culture (routine x 2)     Status: None (Preliminary result)   Collection Time: 04/07/17  2:35 AM  Result Value Ref Range Status   Specimen Description BLOOD RIGHT HAND  Final   Special Requests IN PEDIATRIC BOTTLE Blood Culture adequate volume  Final   Culture   Final    NO GROWTH 4 DAYS Performed at [facility], [place] [address] [place], [place] [address]    Report Status PENDING  Incomplete  Culture, Urine     Status: Abnormal   Collection Time: 04/07/17  6:06 PM  Result Value Ref Range Status   Specimen Description URINE, RANDOM  Final   Special Requests NONE  Final   Culture (A)  Final    <10,000 COLONIES/mL Performed at [facility], [place] [address] [place], [place] [address]    Report Status 04/09/2017 FINAL  Final  Culture, sputum-assessment     Status: None   Collection Time: 04/08/17 12:51 PM  Result Value Ref Range Status   Specimen Description SPUTUM  Final   Special Requests NONE  Final   Sputum evaluation   Final    Sputum specimen not acceptable for testing.  Please recollect.   Gram Stain Report Called to,Read Back By and Verified With: R ARYAL,RN AT 1507 04/08/17 BY L  BENFIELD Performed at [facility], [place] [address] [place], [place] [address]    Report Status 04/08/2017 FINAL  Final      Studies: No results found.  Scheduled Meds: . dextromethorphan-guaiFENesin  1 tablet Oral BID  . digoxin  0.125 mg Oral Daily  . metoprolol succinate  25 mg Oral Daily  . senna-docusate  1 tablet Oral BID    Continuous Infusions:    LOS: 5 days     Kayleen Memos, MD [facility] Pager [phone]  If 7PM-7AM, please contact night-coverage [email or web address] Password [facility] 04/12/2017, 2:38 PM

## 2017-04-12 NOTE — Progress Notes (Signed)
Patient has refused to have NGT tube placed despite explaining the necessity. MD made aware. Attempts to contact family unsuccessful.

## 2017-04-13 ENCOUNTER — Encounter: Payer: Self-pay | Admitting: Cardiology

## 2017-04-13 DIAGNOSIS — E440 Moderate protein-calorie malnutrition: Secondary | ICD-10-CM

## 2017-04-13 LAB — GLUCOSE, CAPILLARY
Glucose-Capillary: 81 mg/dL (ref 65–99)
Glucose-Capillary: 72 mg/dL (ref 65–99)
Glucose-Capillary: 87 mg/dL (ref 65–99)

## 2017-04-13 LAB — BASIC METABOLIC PANEL
ANION GAP: 15 (ref 5–15)
BUN: 11 mg/dL (ref 6–20)
CALCIUM: 8.7 mg/dL — AB (ref 8.9–10.3)
CO2: 22 mmol/L (ref 22–32)
Chloride: 107 mmol/L (ref 101–111)
Creatinine, Ser: 1.04 mg/dL (ref 0.61–1.24)
GFR calc non Af Amer: >60 mL/min (ref >60)
Glucose, Bld: 115 mg/dL — ABNORMAL HIGH (ref 65–99)
Potassium: 3.3 mmol/L — ABNORMAL LOW (ref 3.5–5.1)
Sodium: 144 mmol/L (ref 135–145)

## 2017-04-13 LAB — CBC
HCT: 42.6 % (ref 39.0–52.0)
HEMOGLOBIN: 13.8 g/dL (ref 13.0–17.0)
MCH: 30.5 pg (ref 26.0–34.0)
MCHC: 32.4 g/dL (ref 30.0–36.0)
MCV: 94 fL (ref 78.0–100.0)
PLATELETS: 217 10*3/uL (ref 150–400)
RBC: 4.53 MIL/uL (ref 4.22–5.81)
RDW: 16.1 % — ABNORMAL HIGH (ref 11.5–15.5)
WBC: 9.4 10*3/uL (ref 4.0–10.5)

## 2017-04-13 MED ORDER — JEVITY 1.2 CAL PO LIQD: 1000.0000 mL | ORAL | Status: DC

## 2017-04-13 MED ORDER — DOCUSATE SODIUM 50 MG/5ML PO LIQD
50.0000 mg | Freq: Two times a day (BID) | ORAL | Status: DC
  Administered 2017-04-14 – 2017-04-26 (×14): 50 mg
  Filled 2017-04-13 (×27): qty 10

## 2017-04-13 MED ORDER — JEVITY 1.5 CAL/FIBER PO LIQD
1000.0000 mL | ORAL | Status: DC
  Administered 2017-04-13 – 2017-04-18 (×5): 1000 mL
  Filled 2017-04-13 (×10): qty 1000

## 2017-04-13 MED ORDER — SENNOSIDES 8.8 MG/5ML PO SYRP
5.0000 mL | ORAL_SOLUTION | Freq: Two times a day (BID) | ORAL | Status: DC
  Administered 2017-04-14 – 2017-04-26 (×14): 5 mL
  Filled 2017-04-13 (×27): qty 5

## 2017-04-13 MED ORDER — PRO-STAT SUGAR FREE PO LIQD
30.0000 mL | Freq: Every day | ORAL | Status: DC
  Administered 2017-04-13 – 2017-04-19 (×6): 30 mL
  Filled 2017-04-13 (×7): qty 30

## 2017-04-13 MED ORDER — FREE WATER
150.0000 mL | Status: DC
  Administered 2017-04-13 – 2017-04-18 (×31): 150 mL

## 2017-04-13 NOTE — Progress Notes (Signed)
  Speech Language Pathology Treatment: Dysphagia  Patient Details Name: [name] MRN: [id number] DOB: [birth year] Today's Date: 04/13/2017 Time: 0370-4888 SLP Time Calculation (min) (ACUTE ONLY): 20 min  Assessment / Plan / Recommendation Clinical Impression  Pt was seen for skilled ST targeting dysphagia goals.  Pt is more alert and lucid today in comparison to previous therapy sessions; therefore, therapist attempted to obtain more information regarding baseline swallowing function but pt remains an inconsistent and unreliable historian.  Clinical presentation at bedside remains unchanged with thin liquids and pt demonstrates immediate and delayed coughing with expectoration of secretions mixed with residuals.  Pt looked slightly better with trials of purees; however, he did eventually exhibit delayed coughing with changes in positioning which reinforces ongoing concerns for pharyngeal residue.  Spoke in depth with RN and MD regarding concerns with implementing alternative means of nutrition given that pt will likely have a prolonged recovery course with uncertain prognosis.   Even if NG tube can be placed (pt refusing and no family can be reached for consent), pt may still need PEG placement.   If pt has capacity to make decisions on his own behalf, a palliative care consult would be tremendously helpful in determining goals of care.  Pt was left in bed with bed alarm set and call bell within reach.    HPI HPI: Joshua Juhasz Ludwickis a 84 y.o.malewith medical history significant ofafibnot on anticoagulation due to prior subdural hemorrhage,PE,hypertension, hyperlipidemia, diet-controlled diabetes, asthma, GERD, SDH, CAD,s/p ofCABGandstent placement,sCHFwith EF 35%, AICD placement, who presents with cough, chest pain and fever. T-max 102.1. Right lower lobe infiltrates. Admitted for HCAP complicated by influenza A infection. Pt has a history of mild oropharyngeal and cervical esophageal  dysphagia per MBS 02/07/14; pharyngeal residuals noted across consistencies that liquid swallow help to clear but with resultant trace aspiration of liquids (thin and nectar). Regular diet with thin liquids was recommended as pt's weight was stable and no PNA since 2013. MBS recommended to fully evaluate oropharyngeal swallow.       SLP Plan  Continue with current plan of care       Recommendations  Diet recommendations: NPO Medication Administration: Via alternative means Compensations: Slow rate;Small sips/bites                Oral Care Recommendations: Oral care QID Follow up Recommendations: Skilled Nursing facility SLP Visit Diagnosis: Dysphagia, oropharyngeal phase (R13.12) Plan: Continue with current plan of care       GO                PageSelinda Orion 04/13/2017, 10:57 AM

## 2017-04-13 NOTE — Progress Notes (Signed)
PROGRESS NOTE  [name] JKD:[id number] DOB: 03/20/1932 DOA: 04/07/2017 PCP: [name], MD  HPI/Recap of past 24 hours: [name] is a 84 y.o. male with medical history significant of afibnot on anticoagulation due to prior subdural hemorrhage, PE, hypertension, hyperlipidemia, diet-controlled diabetes, asthma, GERD, SDH, CAD, s/p of CABG and stent placement, sCHF with EF 35%, AICD placement, who presents with cough, chest pain and fever.  T-max 102.1.  Right lower lobe infiltrates. Admitted for HCAP complicated by influenza A infection.  04/08/17: seen and examined. Reports constipation. Bowel regimen added with sennokot. Denies dyspnea or chest pain. A&O x 3.States he is from home and lives alone.  04/09/17: Patient seen and examined at his bedside.  RN reports persistent cough.  Patient started on aggressive pulmonary toilet.  Patient denies dyspnea or chest pain or palpitations.  04/10/2017: Patient seen and examined at his bedside.  RN reports agitation and getting out of bed often with high risk of fall.  Sitter ordered at bedside.  Patient is alert and oriented to place only.  No focal motor deficits or speech deficits.  04/11/17: Patient seen and examined at his bedside one-to-one sitter in the room.  The patient is alert but confused.  Repeated swallow evaluation by barium swallow revealed severe dysphagia.  Patient will remain n.p.o.  The writer attempted to call family members to obtain consent regarding placing an NG tube with no success.  RN will also attempt to contact family.  Needs NG tube to provide medication and hydration.  04/12/2017: Patient seen and examined at his bedside with one-to-one sitter in the room.  Patient initially is agreeable to getting NG tube placement.  Later RN called stating that the patient refuses NG tube placement.  Attempted to call family, his son, but unsuccessfully.  Patient has no new complaints.  He is alert and oriented  x1.  04/13/2017: Patient seen and examined at his bedside.  He is agreeable to getting a cor tract placement in.  Which was done today.  Dietary consulted for tube feeding recommendations.  Start feeding tube today.  Spoke with the patient's son today to update him on the patient's current condition.  Palliative care has been consulted for goals of care.   Assessment/Plan: Principal Problem:   Influenza A Active Problems:   Chronic atrial fibrillation (HCC)   Chronic systolic heart failure- EF 35-40%   CAD (coronary artery disease)   HCAP (healthcare-associated pneumonia)   Thrombocytopenia (HCC)   ICD (implantable cardioverter-defibrillator) in place   Sepsis ([place])   CKD (chronic kidney disease), stage III (HCC)   Acute respiratory failure with hypoxia (HCC)   Malnutrition of moderate degree  Acute hypoxic respiratory failure 2/2 to HCAP, Influenza A, and acute on chronic aspiration -Completed 5 days of IV cefepime. -Completed 5 days of Tamiflu. - passed home O2 evaluation on 04/08/2017 -Repeated Cxr done on 04/09/2017 revealed patchy infiltrate in the right lower lobe with suspicion for pneumonitis. -Continue aggressive pulmonary toilet. -Duo nebs every 6 hours every 2 hours as needed hypersaline nebs 3 times daily chest PT Mucinex twice daily  Acute metabolic encephalopathy most likely multifactorial -Suspect underlying dementia versus infective process with hcap/influenza A -Reorient as needed -Sitter at bedside ordered today -No focal motor deficits or speech deficits -Attempted to contact family members but unsuccessful  Acute on chronic severe dysphagia, worsening -Modified barium swallow evaluation 04/10/2017 -N.p.o. recommended by speech therapy 04/11/2017 -Monitor blood sugar closely every 4 hours when n.p.o. -Unable  to reach family -Started core track today 04/13/2017 for medication, nutrition and hydration -Dietary consult for tube feeding recommendations  Intractable  cough most likely secondary to HCAP Versus influenza A versus silent aspiration -Management as stated above  Chronic a-fib -not on Carrick due to hx GI bleed -continue digoxin and metoprolol succi -rate controlled -continue monitoring  Hypokalemia, possibly secondary to diuretics -K+ 3.4 -repleted with po kcl and iV mag -Repeat BMP today  Normocytic anemia -hg 12.5 from 11.6 -Baseline 13  Polyneuropathy -continue gabapentin  Constipation, resolved - continue Senokot BID -Had one bowel movement 04/08/2017 -monitor stool output  Chronic systolic HFREF 35% post AICD -Continue cardiac meds -Lasix given today -Monitor urine output, I&Os, daily weight  Thrombocytopenia, improving -unclear etiology -no overt bleeding -plt 138 from 120 from 102k  Code Status: full  Family Communication: Spoke with the patient's son over the phone on 04/13/2017 to update him on current condition.  Disposition Plan: We will attempt to get skilled nursing facility however patient had refused in the past.  Consultants:  Speech therapy  Social worker  Palliative care team  Dietitian  Procedures: Modified barium swallow eval Cor track placement  Antimicrobials: Completed 5 days of IV cefepime and IV Tamiflu  DVT prophylaxis:  SCDs   Objective: Vitals:   04/13/17 0603 04/13/17 0758 04/13/17 1157 04/13/17 1533  BP: (!) 150/78 138/89 131/72 131/83  Pulse: (!) 102 97 89 (!) 106  Resp: 20 20 20    Temp: 97.7 F (36.5 C) 97.6 F (36.4 C) (!) 97.4 F (36.3 C)   TempSrc: Oral Oral Oral   SpO2: 100% 96% 91%   Weight: 73.3 kg (161 lb 9.6 oz)     Height:        Intake/Output Summary (Last 24 hours) at 04/13/2017 1626 Last data filed at 04/13/2017 1600 Gross per 24 hour  Intake 120 ml  Output 75 ml  Net 45 ml   Filed Weights   04/10/17 0226 04/11/17 0607 04/13/17 0603  Weight: 75.9 kg (167 lb 4.8 oz) 74.8 kg (165 lb) 73.3 kg (161 lb 9.6 oz)    Exam: 04/13/2017.  Patient seen and  examined.  Physical exam is unchanged from previous.  Except for mentioned below.   General: 84 year old Caucasian male well-developed well-nourished in no acute distress alert and oriented x2   Cardiovascular: Irregular rate and rhythm with no rubs or gallops. Respiratory: Mild rales at bases.  No wheezes.  Abdomen: soft NT ND NBS x4   Musculoskeletal: non focal deficits   Skin: no rash  Psychiatry: Mood is appropriate for condition and setting.   Data Reviewed: CBC: Recent Labs  Lab 04/07/17 0236 04/08/17 1134 04/09/17 0317 04/10/17 0927  WBC 9.5 9.9 9.0 10.3  NEUTROABS 5.4  --   --   --   HGB 13.4 11.6* 11.5* 12.5*  HCT 41.9 35.8* 35.1* 37.2*  MCV 94.2 92.7 91.9 91.9  PLT 101* 102* 120* 329*   Basic Metabolic Panel: Recent Labs  Lab 04/07/17 0236 04/08/17 1134 04/09/17 0317 04/10/17 0927  NA 136 135 133* 138  K 4.0 3.4* 3.5 3.4*  CL 100* 103 104 106  CO2 19* 22 19* 19*  GLUCOSE 84 98 88 89  BUN 18 10 9 10   CREATININE 1.28* 0.92 0.80 0.96  CALCIUM 8.2* 7.7* 7.5* 8.2*  MG  --   --  1.7  --    GFR: Estimated Creatinine Clearance: 61.5 mL/min (by C-G formula based on SCr of 0.96 mg/dL). Liver  Function Tests: Recent Labs  Lab 04/07/17 0236  AST 33  ALT 16*  ALKPHOS 50  BILITOT 2.5*  PROT 6.2*  ALBUMIN 3.2*   No results for input(s): LIPASE, AMYLASE in the last 168 hours. No results for input(s): AMMONIA in the last 168 hours. Coagulation Profile: No results for input(s): INR, PROTIME in the last 168 hours. Cardiac Enzymes: No results for input(s): CKTOTAL, CKMB, CKMBINDEX, TROPONINI in the last 168 hours. BNP (last 3 results) No results for input(s): PROBNP in the last 8760 hours. HbA1C: No results for input(s): HGBA1C in the last 72 hours. CBG: Recent Labs  Lab 04/12/17 1640 04/12/17 1953 04/12/17 2356 04/13/17 0353 04/13/17 0729  GLUCAP 81 84 79 72 87   Lipid Profile: No results for input(s): CHOL, HDL, LDLCALC, TRIG, CHOLHDL,  LDLDIRECT in the last 72 hours. Thyroid Function Tests: No results for input(s): TSH, T4TOTAL, FREET4, T3FREE, THYROIDAB in the last 72 hours. Anemia Panel: No results for input(s): VITAMINB12, FOLATE, FERRITIN, TIBC, IRON, RETICCTPCT in the last 72 hours. Urine analysis:    Component Value Date/Time   COLORURINE AMBER (A) 04/07/2017 0218   APPEARANCEUR HAZY (A) 04/07/2017 0218   LABSPEC 1.028 04/07/2017 0218   PHURINE 5.0 04/07/2017 0218   GLUCOSEU NEGATIVE 04/07/2017 0218   HGBUR SMALL (A) 04/07/2017 0218   BILIRUBINUR NEGATIVE 04/07/2017 0218   KETONESUR 20 (A) 04/07/2017 0218   PROTEINUR 100 (A) 04/07/2017 0218   UROBILINOGEN 0.2 09/09/2012 1501   NITRITE NEGATIVE 04/07/2017 0218   LEUKOCYTESUR NEGATIVE 04/07/2017 0218   Sepsis Labs: @LABRCNTIP (procalcitonin:4,lacticidven:4)  ) Recent Results (from the past 240 hour(s))  MRSA PCR Screening     Status: None   Collection Time: 04/04/17  1:21 AM  Result Value Ref Range Status   MRSA by PCR NEGATIVE NEGATIVE Final    Comment:        The GeneXpert MRSA Assay (FDA approved for NASAL specimens only), is one component of a comprehensive MRSA colonization surveillance program. It is not intended to diagnose MRSA infection nor to guide or monitor treatment for MRSA infections. Performed at [facility], [place] [address], [place], [place] [address]   Blood Culture (routine x 2)     Status: None   Collection Time: 04/07/17  2:20 AM  Result Value Ref Range Status   Specimen Description BLOOD RIGHT ANTECUBITAL  Final   Special Requests IN PEDIATRIC BOTTLE Blood Culture adequate volume  Final   Culture   Final    NO GROWTH 5 DAYS Performed at [facility], [place] [address], [place], [place] [address]    Report Status 04/12/2017 FINAL  Final  Blood Culture (routine x 2)     Status: None   Collection Time: 04/07/17  2:35 AM  Result Value Ref Range Status   Specimen Description BLOOD RIGHT HAND  Final   Special  Requests IN PEDIATRIC BOTTLE Blood Culture adequate volume  Final   Culture   Final    NO GROWTH 5 DAYS Performed at [facility], [place] [address], [place], [place] [address]    Report Status 04/12/2017 FINAL  Final  Culture, Urine     Status: Abnormal   Collection Time: 04/07/17  6:06 PM  Result Value Ref Range Status   Specimen Description URINE, RANDOM  Final   Special Requests NONE  Final   Culture (A)  Final    <10,000 COLONIES/mL Performed at [facility], [place] [address] [place], [place] [address]  Report Status 04/09/2017 FINAL  Final  Culture, sputum-assessment     Status: None   Collection Time: 04/08/17 12:51 PM  Result Value Ref Range Status   Specimen Description SPUTUM  Final   Special Requests NONE  Final   Sputum evaluation   Final    Sputum specimen not acceptable for testing.  Please recollect.   Gram Stain Report Called to,Read Back By and Verified With: R ARYAL,RN AT 1507 04/08/17 BY L BENFIELD Performed at [facility], [place] [address] [place], [place] [address]    Report Status 04/08/2017 FINAL  Final      Studies: No results found.  Scheduled Meds: . dextromethorphan-guaiFENesin  1 tablet Oral BID  . digoxin  0.125 mg Oral Daily  . feeding supplement (PRO-STAT SUGAR FREE 64)  30 mL Per Tube Daily  . free water  150 mL Per Tube Q4H  . metoprolol succinate  25 mg Oral Daily  . senna-docusate  1 tablet Oral BID    Continuous Infusions: . feeding supplement (JEVITY 1.5 CAL/FIBER)       LOS: 6 days     Kayleen Memos, MD [facility] Pager [phone]  If 7PM-7AM, please contact night-coverage [email or web address] Password [facility] 04/13/2017, 4:26 PM

## 2017-04-13 NOTE — Progress Notes (Signed)
Palliative Medicine consult noted. Due to high referral volume, there may be a delay seeing this patient. Please call the Palliative Medicine Team office at [phone] if recommendations are needed in the interim.  Thank you for inviting [place] to see this patient.  Marjie Skiff Birda Didonato, RN, BSN, [facility] Palliative Medicine Team 04/13/2017 11:06 AM Office [phone]

## 2017-04-13 NOTE — Progress Notes (Signed)
Nutrition Follow-up  DOCUMENTATION CODES:   Non-severe (moderate) malnutrition in context of chronic illness  INTERVENTION:   Initiate Jevity 1.5 @ 20 ml/hr and increase by 10 ml every 8 hours to goal rate of 50 ml/hr.   30 ml Prostat daily.    Recommend 150 ml free water flush every 4 hours  Tube feeding regimen provides 1900 kcal (100% of needs), 92 grams of protein, and 912 ml of H2O. (Total free water: 1812 ml)  -Recommend monitoring K, Mg, and Phos daily x 3 days and repleting as necessary due to high refeeding risk  NUTRITION DIAGNOSIS:   Moderate Malnutrition related to chronic illness(CHF, SDH) as evidenced by mild fat depletion, moderate fat depletion, mild muscle depletion, moderate muscle depletion, percent weight loss.  Ongoing  GOAL:   Patient will meet greater than or equal to 90% of their needs  Progressing  MONITOR:   Diet advancement, Weight trends, TF tolerance, Skin, I & O's  REASON FOR ASSESSMENT:   Consult Enteral/tube feeding initiation and management  ASSESSMENT:   [name] is a 84 y.o. male with medical history significant of afib not on anticoagulation due to prior subdural hemorrhage, PE, hypertension, hyperlipidemia, diet-controlled diabetes, asthma, GERD, SDH, CAD, s/p of CABG and stent placement, sCHF with EF 35%, AICD placement, who presents with cough, chest pain or fever.  3/10- s/p BSE- recommend NPO 3/11- s/p MBSS- recommend continue NPO 3/12- s/p repeat BSE- recommend continued NPO with nutrition via alternative needs 3/14- cortrak tube placed; tip of tube confirmed post-pyloric  Case discussed with RN and RD form cortrak tube team; pt amenable to cortrak tube placement (which was placed prior to RD visit). Plan to start feedings today.   Spoke with pt at bedside, who was significantly more alert today than at previous visit. Pt very inquisitive about TF ("I want to know everything"); RD explained how pt willex receive  nutrition until able to take PO's safely. RD even showed picture of feeding pump and formula to put pt at ease.   Pt reports he was eating well PTA: he denies any difficulty swallowing PTA. He is unsure if he has lost weight.   Labs reviewed.   Diet Order:  Fall precautions Diet NPO time specified  EDUCATION NEEDS:   Education needs have been addressed  Skin:  Skin Assessment: Skin Integrity Issues: Skin Integrity Issues:: Other (Comment) Other: skin tear rt and lt elbows  Last BM:  04/11/17  Height:   Ht Readings from Last 1 Encounters:  04/07/17 6\' 1"  (1.854 m)    Weight:   Wt Readings from Last 1 Encounters:  04/13/17 161 lb 9.6 oz (73.3 kg)    Ideal Body Weight:  83.6 kg  BMI:  Body mass index is 21.32 kg/m.  Estimated Nutritional Needs:   Kcal:  1850-2050  Protein:  90-105 grams  Fluid:  1.8-2.0 L    Toniesha Zellner A. Jimmye Norman, RD, LDN, CDE Pager: [phone] After hours Pager: [phone]

## 2017-04-13 NOTE — Progress Notes (Signed)
Clarified orders for Cortrak placement with MD. Paged Heather with [facility] Team [phone] and stated we don't need consent for placement.

## 2017-04-13 NOTE — Progress Notes (Signed)
Dr. Nevada Crane made aware that Cortrak tube is inplace and ready for use.

## 2017-04-13 NOTE — Progress Notes (Signed)
Tube Feeding started Jevity 1.5 @ 8ml per hr.Will monitor if ptatient tolerates TF.

## 2017-04-13 NOTE — Progress Notes (Signed)
Cortrak Tube Team Note:  Consult received to place a Cortrak feeding tube.   A 10 F Cortrak tube was placed in the R nare and secured with a nasal bridle at 88 cm. Per the Cortrak monitor reading the tube tip is post pyloric.   No x-ray is required. RN may begin using tube.   If the tube becomes dislodged please keep the tube and contact the Cortrak team at [email or web address] (password [email or web address]) for replacement.  If after hours and replacement cannot be delayed, place a NG tube and confirm placement with an abdominal x-ray.    [name], Joshua, [name] Pager [phone] After Hours Pager

## 2017-04-13 NOTE — Clinical Social Work Note (Signed)
CSW called patient's son to discuss SNF placement since patient is not only oriented to self. Patient's son wants more information on his medical condition before making the decision for SNF vs. Returning home. He stated he tried calling MD back the other day but the phone number did not work. He asked that CSW try to facilitate MD calling him again. CSW paged MD to notify.  Dayton Scrape, [place]

## 2017-04-14 DIAGNOSIS — J9601 Acute respiratory failure with hypoxia: Secondary | ICD-10-CM

## 2017-04-14 DIAGNOSIS — J101 Influenza due to other identified influenza virus with other respiratory manifestations: Secondary | ICD-10-CM

## 2017-04-14 DIAGNOSIS — Z7189 Other specified counseling: Secondary | ICD-10-CM

## 2017-04-14 DIAGNOSIS — Z515 Encounter for palliative care: Secondary | ICD-10-CM

## 2017-04-14 LAB — GLUCOSE, CAPILLARY
GLUCOSE-CAPILLARY: 150 mg/dL — AB (ref 65–99)
GLUCOSE-CAPILLARY: 158 mg/dL — AB (ref 65–99)
Glucose-Capillary: 140 mg/dL — ABNORMAL HIGH (ref 65–99)
Glucose-Capillary: 181 mg/dL — ABNORMAL HIGH (ref 65–99)
Glucose-Capillary: 155 mg/dL — ABNORMAL HIGH (ref 65–99)

## 2017-04-14 LAB — BASIC METABOLIC PANEL
Anion gap: 13 (ref 5–15)
BUN: 12 mg/dL (ref 6–20)
CO2: 21 mmol/L — ABNORMAL LOW (ref 22–32)
Calcium: 8.4 mg/dL — ABNORMAL LOW (ref 8.9–10.3)
Chloride: 110 mmol/L (ref 101–111)
Creatinine, Ser: 0.94 mg/dL (ref 0.61–1.24)
GFR calc Af Amer: >60 mL/min (ref >60)
GFR calc non Af Amer: >60 mL/min (ref >60)
Glucose, Bld: 158 mg/dL — ABNORMAL HIGH (ref 65–99)
Potassium: 3.1 mmol/L — ABNORMAL LOW (ref 3.5–5.1)
Sodium: 144 mmol/L (ref 135–145)

## 2017-04-14 LAB — MAGNESIUM: Magnesium: 1.5 mg/dL — ABNORMAL LOW (ref 1.7–2.4)

## 2017-04-14 MED ORDER — POTASSIUM CHLORIDE 20 MEQ/15ML (10%) PO SOLN
40.0000 meq | Freq: Three times a day (TID) | ORAL | Status: AC
  Administered 2017-04-14 – 2017-04-15 (×2): 40 meq via ORAL
  Filled 2017-04-14 (×2): qty 30

## 2017-04-14 MED ORDER — MAGNESIUM SULFATE 2 GM/50ML IV SOLN
2.0000 g | Freq: Once | INTRAVENOUS | Status: AC
  Administered 2017-04-14: 2 g via INTRAVENOUS
  Filled 2017-04-14: qty 50

## 2017-04-14 MED ORDER — POTASSIUM CHLORIDE CRYS ER 20 MEQ PO TBCR
40.0000 meq | EXTENDED_RELEASE_TABLET | Freq: Three times a day (TID) | ORAL | Status: DC
  Filled 2017-04-14: qty 2

## 2017-04-14 NOTE — Consult Note (Signed)
   [facility] CM Inpatient Consult   04/14/2017  Erika [facility] 06/24/1932 [id number]  Follow up: [facility] First program prior to admission -   Patient has been refusing care NG placement, needed sitters for a few days,  and refusing SNF. Patient discussed in unit progression meeting regarding an assessment for capacity.  Patient now has the NG (Cortrac) feedings.  He also has a palliative care consult note in about the referral ha been received. Will follow for update and disposition.  For questions,   [name], RN BSN [facility] Liaison  [phone] business mobile phone Toll free office [phone]

## 2017-04-14 NOTE — Progress Notes (Signed)
Nutrition Follow-up  DOCUMENTATION CODES:   Non-severe (moderate) malnutrition in context of chronic illness  INTERVENTION:   -Continue Jevity 1.5@ 65ml/hr and increase by 10 ml every 8hours to goal rate of 5ml/hr.   37ml Prostat daily.  150 ml free water flush every 4 hours  Tube feeding regimen provides1900kcal (100% of needs),92grams of protein, and 978ml of H2O. (Total free water: 1812 ml)  -Recommend monitoring K, Mg, and Phos daily x 3 days and repleting as necessary due to high refeeding risk  -Will transition to bolus feedings once PEG is placed; bolus feedings through post-pyloric small bore feeding tube are contrainidcated  NUTRITION DIAGNOSIS:   Moderate Malnutrition related to chronic illness(CHF, SDH) as evidenced by mild fat depletion, moderate fat depletion, mild muscle depletion, moderate muscle depletion, percent weight loss.  Ongoing  GOAL:   Patient will meet greater than or equal to 90% of their needs  Progressing  MONITOR:   Diet advancement, Weight trends, TF tolerance, Skin, I & O's  REASON FOR ASSESSMENT:   Consult Enteral/tube feeding initiation and management  ASSESSMENT:   [name] is a 84 y.o. male with medical history significant of afib not on anticoagulation due to prior subdural hemorrhage, PE, hypertension, hyperlipidemia, diet-controlled diabetes, asthma, GERD, SDH, CAD, s/p of CABG and stent placement, sCHF with EF 35%, AICD placement, who presents with cough, chest pain or fever.  3/10- s/p BSE- recommend NPO 3/11- s/p MBSS- recommend continue NPO 3/12- s/p repeat BSE- recommend continued NPO with nutrition via alternative needs 3/14- cortrak tube placed; tip of tube confirmed post-pyloric  Paged by Dr. Nevada Crane, who is requesting transition to bolus feedings. Per discussion with Dr. Nevada Crane, pt is open to PEG placement and GI/IR service will discuss procedure with pt. Pt is currently receiving tube feedings via  post-pyloric cortrak tube. It is not recommended to provide bolus feedings via post-pyloric small bore feeding tube. Post-pyloric feedings may also decrease risk of aspiration. RD recommended to MD to transition to bolus feedings once PEG is placed; MD requested transition to bolus feedings as soon as possible.    Labs reviewed: K: 3.1, Mg: 1.5 CBGS: 87-150.   Diet Order:  Fall precautions Diet NPO time specified  EDUCATION NEEDS:   Education needs have been addressed  Skin:  Skin Assessment: Skin Integrity Issues: Skin Integrity Issues:: Other (Comment) Other: skin tear rt and lt elbows  Last BM:  04/13/17  Height:   Ht Readings from Last 1 Encounters:  04/07/17 6\' 1"  (1.854 m)    Weight:   Wt Readings from Last 1 Encounters:  04/14/17 161 lb 1.6 oz (73.1 kg)    Ideal Body Weight:  83.6 kg  BMI:  Body mass index is 21.25 kg/m.  Estimated Nutritional Needs:   Kcal:  1850-2050  Protein:  90-105 grams  Fluid:  1.8-2.0 L    Leoni Goodness A. Jimmye Norman, RD, LDN, CDE Pager: [phone] After hours Pager: [phone]

## 2017-04-14 NOTE — Progress Notes (Signed)
Called pharmacy regarding pt oral potassium order  Pt NPO, requesting liquid for tube compatibility  Pharmacy stated he will change

## 2017-04-14 NOTE — Progress Notes (Addendum)
PROGRESS NOTE  [name] FUX:[id number] DOB: [birth year] DOA: 04/07/2017 PCP: [name], MD  HPI/Recap of past 24 hours: [name] is a 84 y.o. male with medical history significant of afibnot on anticoagulation due to prior subdural hemorrhage, PE, hypertension, hyperlipidemia, diet-controlled diabetes, asthma, GERD, SDH, CAD, s/p of CABG and stent placement, sCHF with EF 35%, AICD placement, who presents with cough, chest pain and fever.  T-max 102.1.  Right lower lobe infiltrates. Admitted for HCAP complicated by influenza A infection.  04/08/17: seen and examined. Reports constipation. Bowel regimen added with sennokot. Denies dyspnea or chest pain. A&O x 3.States he is from home and lives alone.  04/09/17: Patient seen and examined at his bedside.  RN reports persistent cough.  Patient started on aggressive pulmonary toilet.  Patient denies dyspnea or chest pain or palpitations.  04/10/2017: Patient seen and examined at his bedside.  RN reports agitation and getting out of bed often with high risk of fall.  Sitter ordered at bedside.  Patient is alert and oriented to place only.  No focal motor deficits or speech deficits.  04/11/17: Patient seen and examined at his bedside one-to-one sitter in the room.  The patient is alert but confused.  Repeated swallow evaluation by barium swallow revealed severe dysphagia.  Patient will remain n.p.o.  The writer attempted to call family members to obtain consent regarding placing an NG tube with no success.  RN will also attempt to contact family.  Needs NG tube to provide medication and hydration.  04/12/2017: Patient seen and examined at his bedside with one-to-one sitter in the room.  Patient initially is agreeable to getting NG tube placement.  Later RN called stating that the patient refuses NG tube placement.  Attempted to call family, his son, but unsuccessfully.  Patient has no new complaints.  He is alert and oriented  x1.  04/13/2017: Patient seen and examined at his bedside.  He is agreeable to getting a cor tract placement in.  Which was done today.  Dietary consulted for tube feeding recommendations.  Start feeding tube today.  Spoke with the patient's son today to update him on the patient's current condition.  Palliative care has been consulted for goals of care.  04/14/17: Patient seen and examined at his bedside.  He has no new complaints.  He is alert and oriented x3.  Denies nausea or abdominal pain.  Denies chest pain shortness of breath or palpitations.   Assessment/Plan: Principal Problem:   Influenza A Active Problems:   Chronic atrial fibrillation (HCC)   Chronic systolic heart failure- EF 35-40%   CAD (coronary artery disease)   HCAP (healthcare-associated pneumonia)   Thrombocytopenia (HCC)   ICD (implantable cardioverter-defibrillator) in place   Sepsis ([place])   CKD (chronic kidney disease), stage III ([place])   Acute respiratory failure with hypoxia (HCC)   Malnutrition of moderate degree   Goals of care, counseling/discussion   Palliative care by specialist  Acute hypoxic respiratory failure 2/2 to HCAP, Influenza A, and acute on chronic aspiration -Completed 5 days of IV cefepime. -Completed 5 days of Tamiflu. - passed home O2 evaluation on 04/08/2017 -Repeated Cxr done on 04/09/2017 revealed patchy infiltrate in the right lower lobe with suspicion for pneumonitis. -Continue aggressive pulmonary toilet. -Duo nebs every 6 hours every 2 hours as needed hypersaline nebs 3 times daily chest PT Mucinex twice daily  Acute metabolic encephalopathy most likely multifactorial, resolved -Suspect underlying dementia versus infective process with hcap/influenza A -  Reorient as needed -Sitter at bedside ordered today -No focal motor deficits or speech deficits -Attempted to contact family members but unsuccessful  Acute on chronic severe dysphagia/feeding tube in place -Modified barium swallow  evaluation 04/10/2017 -N.p.o. recommended by speech therapy 04/11/2017 -Monitor blood sugar closely every 4 hours when n.p.o. -Spoke with the patient's son on the phone 04/13/2017 -Started core track 04/13/2017 for medication, nutrition and hydration -Dietary consult for tube feeding recommendations -Feeding tube in place on 04/14/2017.  Recommend tube feeding 4 times a day.  Maintain the head of the bed 35 degree or above during feedings. -Patient still undecided about getting a PEG tube placement.  Intractable cough most likely secondary to HCAP Versus influenza A versus silent aspiration, improving -Management as stated above  Chronic a-fib -not on Portal due to hx GI bleed -continue digoxin and metoprolol succi -rate controlled -continue monitoring  Hypokalemia/ hypomagnesemia, possibly secondary to diuretics -K+ 3.1 from 3.4 -Magnesium 1.5 -repleted with po kcl and iV mag -Repeat BMP today  Normocytic anemia -hg 12.5 from 11.6 -Baseline 13  Polyneuropathy -continue gabapentin  Constipation, resolved - continue Senokot BID -Had one bowel movement 04/08/2017 -monitor stool output  Chronic systolic HFREF 42% post AICD -Continue cardiac meds -Lasix given today -Monitor urine output, I&Os, daily weight  Thrombocytopenia, improving -unclear etiology -no overt bleeding -plt 138 from 120 from 102k  Code Status: full  Family Communication: Spoke with the patient's son over the phone on 04/13/2017 to update him on current condition.  Disposition Plan: We will attempt to get skilled nursing facility however patient had refused in the past.  Consultants:  Speech therapy  Social worker  Palliative care team  Dietitian  Procedures: Modified barium swallow eval Cor track placement  Antimicrobials: Completed 5 days of IV cefepime and IV Tamiflu  DVT prophylaxis:  SCDs   Objective: Vitals:   04/13/17 2100 04/14/17 0513 04/14/17 0600 04/14/17 1123  BP: (!) 142/69  139/68  124/71  Pulse: 98 89  88  Resp: 20 20  20   Temp: 97.8 F (36.6 C) 97.9 F (36.6 C)  98 F (36.7 C)  TempSrc: Oral Oral  Oral  SpO2: 97% 98%  96%  Weight:   73.1 kg (161 lb 1.6 oz)   Height:        Intake/Output Summary (Last 24 hours) at 04/14/2017 1511 Last data filed at 04/14/2017 0914 Gross per 24 hour  Intake 450 ml  Output 2 ml  Net 448 ml   Filed Weights   04/11/17 0607 04/13/17 0603 04/14/17 0600  Weight: 74.8 kg (165 lb) 73.3 kg (161 lb 9.6 oz) 73.1 kg (161 lb 1.6 oz)    Exam: 04/14/2017.  Patient seen and examined.  Physical exam is unchanged from previous.  Except for mentioned below.   General: 84 year old Caucasian male well-developed and well-nourished in no acute distress alert oriented x3.  Cardiovascular: Irregular rate and rhythm with no rubs or gallops. Respiratory: Mild rales at bases.  No wheezes.  Abdomen: soft NT ND NBS x4   Musculoskeletal: non focal deficits   Skin: no rash  Psychiatry: Mood is appropriate for condition and setting.   Data Reviewed: CBC: Recent Labs  Lab 04/08/17 1134 04/09/17 0317 04/10/17 0927 04/13/17 1634  WBC 9.9 9.0 10.3 9.4  HGB 11.6* 11.5* 12.5* 13.8  HCT 35.8* 35.1* 37.2* 42.6  MCV 92.7 91.9 91.9 94.0  PLT 102* 120* 138* 595   Basic Metabolic Panel: Recent Labs  Lab 04/08/17 1134 04/09/17 0317  04/10/17 0927 04/13/17 1634 04/14/17 0724  NA 135 133* 138 144 144  K 3.4* 3.5 3.4* 3.3* 3.1*  CL 103 104 106 107 110  CO2 22 19* 19* 22 21*  GLUCOSE 98 88 89 115* 158*  BUN 10 9 10 11 12   CREATININE 0.92 0.80 0.96 1.04 0.94  CALCIUM 7.7* 7.5* 8.2* 8.7* 8.4*  MG  --  1.7  --   --  1.5*   GFR: Estimated Creatinine Clearance: 62.6 mL/min (by C-G formula based on SCr of 0.94 mg/dL). Liver Function Tests: No results for input(s): AST, ALT, ALKPHOS, BILITOT, PROT, ALBUMIN in the last 168 hours. No results for input(s): LIPASE, AMYLASE in the last 168 hours. No results for input(s): AMMONIA in the last  168 hours. Coagulation Profile: No results for input(s): INR, PROTIME in the last 168 hours. Cardiac Enzymes: No results for input(s): CKTOTAL, CKMB, CKMBINDEX, TROPONINI in the last 168 hours. BNP (last 3 results) No results for input(s): PROBNP in the last 8760 hours. HbA1C: No results for input(s): HGBA1C in the last 72 hours. CBG: Recent Labs  Lab 04/13/17 0353 04/13/17 0729 04/14/17 0659 04/14/17 0740 04/14/17 1109  GLUCAP 72 87 150* 140* 158*   Lipid Profile: No results for input(s): CHOL, HDL, LDLCALC, TRIG, CHOLHDL, LDLDIRECT in the last 72 hours. Thyroid Function Tests: No results for input(s): TSH, T4TOTAL, FREET4, T3FREE, THYROIDAB in the last 72 hours. Anemia Panel: No results for input(s): VITAMINB12, FOLATE, FERRITIN, TIBC, IRON, RETICCTPCT in the last 72 hours. Urine analysis:    Component Value Date/Time   COLORURINE AMBER (A) 04/07/2017 0218   APPEARANCEUR HAZY (A) 04/07/2017 0218   LABSPEC 1.028 04/07/2017 0218   PHURINE 5.0 04/07/2017 0218   GLUCOSEU NEGATIVE 04/07/2017 0218   HGBUR SMALL (A) 04/07/2017 0218   BILIRUBINUR NEGATIVE 04/07/2017 0218   KETONESUR 20 (A) 04/07/2017 0218   PROTEINUR 100 (A) 04/07/2017 0218   UROBILINOGEN 0.2 09/09/2012 1501   NITRITE NEGATIVE 04/07/2017 0218   LEUKOCYTESUR NEGATIVE 04/07/2017 0218   Sepsis Labs: @LABRCNTIP (procalcitonin:4,lacticidven:4)  ) Recent Results (from the past 240 hour(s))  Blood Culture (routine x 2)     Status: None   Collection Time: 04/07/17  2:20 AM  Result Value Ref Range Status   Specimen Description BLOOD RIGHT ANTECUBITAL  Final   Special Requests IN PEDIATRIC BOTTLE Blood Culture adequate volume  Final   Culture   Final    NO GROWTH 5 DAYS Performed at [facility], [place] [address] [place], [place] [address]    Report Status 04/12/2017 FINAL  Final  Blood Culture (routine x 2)     Status: None   Collection Time: 04/07/17  2:35 AM  Result Value Ref Range Status   Specimen  Description BLOOD RIGHT HAND  Final   Special Requests IN PEDIATRIC BOTTLE Blood Culture adequate volume  Final   Culture   Final    NO GROWTH 5 DAYS Performed at [facility], [place] [address], [place], [place] [address]    Report Status 04/12/2017 FINAL  Final  Culture, Urine     Status: Abnormal   Collection Time: 04/07/17  6:06 PM  Result Value Ref Range Status   Specimen Description URINE, RANDOM  Final   Special Requests NONE  Final   Culture (A)  Final    <10,000 COLONIES/mL Performed at [facility], [place] [address] [place], [place] [address]    Report Status 04/09/2017 FINAL  Final  Culture, sputum-assessment  Status: None   Collection Time: 04/08/17 12:51 PM  Result Value Ref Range Status   Specimen Description SPUTUM  Final   Special Requests NONE  Final   Sputum evaluation   Final    Sputum specimen not acceptable for testing.  Please recollect.   Gram Stain Report Called to,Read Back By and Verified With: R ARYAL,RN AT 1507 04/08/17 BY L BENFIELD Performed at [facility], [place] [address], [place], [place] [address]    Report Status 04/08/2017 FINAL  Final      Studies: No results found.  Scheduled Meds: . dextromethorphan-guaiFENesin  1 tablet Oral BID  . digoxin  0.125 mg Oral Daily  . docusate  50 mg Per Tube BID  . feeding supplement (PRO-STAT SUGAR FREE 64)  30 mL Per Tube Daily  . free water  150 mL Per Tube Q4H  . metoprolol succinate  25 mg Oral Daily  . sennosides  5 mL Per Tube BID    Continuous Infusions: . feeding supplement (JEVITY 1.5 CAL/FIBER) 1,000 mL (04/14/17 0933)     LOS: 7 days     Kayleen Memos, MD [facility] Pager [phone]  If 7PM-7AM, please contact night-coverage [email or web address] Password [email or web address] 04/14/2017, 3:11 PM

## 2017-04-14 NOTE — Consult Note (Signed)
                                                                                 Consultation Note Date: 04/14/2017   Patient Name: [name]  DOB: 06/21/1932  MRN: [id number]  Age / Sex: 84 y.o., male  PCP: Fry, Stephen A, MD Referring Physician: Hall, Carole N, DO  Reason for Consultation: Establishing goals of care and Psychosocial/spiritual support  HPI/Patient Profile: 84 y.o. male  with past medical history of atrial fib, subdural hematoma, PE, hypertension, hyperlipidemia, diabetes, GERD, coronary artery disease status post CABG, AICD, CHF with an ejection fraction of 35% admitted on 04/07/2017 with cough, weakness.  Patient was found to have right lower lobe pneumonia and was positive for flu a.  Patient has had 6 hospitalizations in the past 6 months as well as 4 emergency room visits secondary to declining health.  This is the first time palliative medicine team has met with [name]  Consult ordered for goals of care.   Clinical Assessment and Goals of Care: Patient is oriented to self, understands he is in the hospital and remembers being told that he has pneumonia and the flu.  He tells me he has never filled out any advanced directives and has not given any "thought to those things".  He does not feel like he is getting much better.  When I asked him how he was doing at home by himself he said "fine".  He tells me he has 2 children, a son who resides in [place] and a daughter in [place].  He reiterates that he is not talked to them about advanced directives.  Per chart review when patient was admitted in February 2019 he was seen by psychiatry and deemed not to have capacity at this point  He currently has a cortrak in.  I did open conversations regarding artificial feeding, that this measure that is inserted through his nose is temporary.  He again states that he has not given any thought to artificial feeding, CODE STATUS or AICD deactivation  He is extremely  weak.  And answers most questions with "I do not know".  Patient has 2 children per his report.  There is one emergency contact listed in the chart as his son, [name] at [phone]  Patient is attempting to work with physical therapy and can follow some simple commands.  He is desaturating with any sort of activity.  Skilled nursing home placement is being recommended    SUMMARY OF RECOMMENDATIONS   Continue full scope of treatment for now Patient needs help with healthcare decision making at this point Palliative medicine provider left message with patient's son to discuss goals of care Palliative medicine recommendations if patient and family were agreeable would be DNR given his comorbidities as well as congestive heart failure with an EF of 35%, de activation of AICD, no permanent feeding tube.  Given patient's multiple comorbidities, specifically degree of heart failure, weakness, functional decline as evidenced by multiple hospitalizations, full aggressive scope of treatment that includes CPR, intubation, defibrillation would likely cause more distress than benefit Would recommend to continue to treat the treatable, pneumonia with antibiotics   IV fluids as indicated ; recommend SNF as he would have a great deal of difficulty at home just with ADL's Code Status/Advance Care Planning:  Full code    Symptom Management:   Dyspnea: Continue with targeted pulmonary treatments, oxygen, Mucinex, nebulizer treatments.  Patient has an order for morphine IV 1 mg every 3 hours as needed for pain or shortness of breath  Palliative Prophylaxis:   Aspiration, Bowel Regimen, Delirium Protocol, Eye Care, Frequent Pain Assessment, Oral Care and Turn Reposition  Additional Recommendations (Limitations, Scope, Preferences):  Full Scope Treatment  Psycho-social/Spiritual:   Desire for further Chaplaincy support:no  Additional Recommendations: Referral to Community Resources    Prognosis:   Unable to determine  Discharge Planning: To Be Determined      Primary Diagnoses: Present on Admission: . Thrombocytopenia ([place]) . ICD (implantable cardioverter-defibrillator) in place . Chronic systolic heart failure- EF 35-40% . Chronic atrial fibrillation ([place]) . CAD (coronary artery disease) . Sepsis ([place]) . Influenza A . HCAP (healthcare-associated pneumonia) . CKD (chronic kidney disease), stage III ([place]) . Acute respiratory failure with hypoxia ([place])   I have reviewed the medical record, interviewed the patient and family, and examined the patient. The following aspects are pertinent.  Past Medical History:  Diagnosis Date  . Abnormal thyroid scan    Abnormal thyroid imaging studies from 11/09/2010, status post ultrasound guided fine needle aspiration of the dominant left inferior thyroid nodule on 12/15/2010. Cytology report showed rare follicular epithelial cells and hemosiderin laden macrophages.  . Adenomatous colon polyp   . AICD (automatic cardioverter/defibrillator) present    a. fx lead; a. s/p lead extraction 03/02/17  . Arthritis    "all over"  . Asthma   . Atrial fibrillation ([place])    on chronic Coumadin; stopped July 2013 due to subdural hematomas  . CHF (congestive heart failure) ([facility])    EF 35-40% s/p most recent ICD generator change-out with [employer] dual-chamber ICD 05/20/11 with explantation of previous abdominally-implanted device  . Coronary artery disease    s/p CABG 1983 and PCI/stent 2004.   . Diabetes mellitus    diet controlled  . Diverticulosis   . Dyslipidemia   . Enteritis   . Erythrocytosis   . GERD (gastroesophageal reflux disease)   . Hepatic steatosis   . Hypertension   . Ischemic cardiomyopathy    WITH CHF  . Monocytosis 04/17/2013  . Myocardial infarction ([place]) 1983; ~ 1990  . Pleural effusion    right  . Pneumonia August 2013  . Portal hypertensive gastropathy ([place])   . Rectal ulcer   . Renal calculi    . Subdural hematoma [facility]) July 2013   Anticoagulation stopped.   . SunDown syndrome   . VT (ventricular tachycardia) ([facility])    Social History   Socioeconomic History  . Marital status: Divorced    Spouse name: None  . Number of children: 2  . Years of education: None  . Highest education level: None  Social Needs  . Financial resource strain: None  . Food insecurity - worry: None  . Food insecurity - inability: None  . Transportation needs - medical: None  . Transportation needs - non-medical: None  Occupational History  . Occupation: Sports coach: RETIRED  Tobacco Use  . Smoking status: Former Smoker    Packs/day: 2.00    Years: 30.00    Pack years: 60.00    Types: Cigarettes    Last attempt to quit: 06/23/1976  Years since quitting: 40.8  . Smokeless tobacco: Never Used  Substance and Sexual Activity  . Alcohol use: No  . Drug use: No  . Sexual activity: No  Other Topics Concern  . None  Social History Narrative  . None   Family History  Problem Relation Age of Onset  . Tuberculosis Mother   . Tuberculosis Father   . Heart disease Brother   . Diabetes Sister   . Diabetes Brother   . Clotting disorder Brother    Scheduled Meds: . dextromethorphan-guaiFENesin  1 tablet Oral BID  . digoxin  0.125 mg Oral Daily  . docusate  50 mg Per Tube BID  . feeding supplement (PRO-STAT SUGAR FREE 64)  30 mL Per Tube Daily  . free water  150 mL Per Tube Q4H  . metoprolol succinate  25 mg Oral Daily  . sennosides  5 mL Per Tube BID   Continuous Infusions: . feeding supplement (JEVITY 1.5 CAL/FIBER) 1,000 mL (04/14/17 0933)   PRN Meds:.acetaminophen, calcium carbonate, fluticasone, gabapentin, guaiFENesin-dextromethorphan, ipratropium-albuterol, loratadine, morphine injection, nitroGLYCERIN, phenol Medications Prior to Admission:  Prior to Admission medications   Medication Sig Start Date End Date Taking? Authorizing Provider  acetaminophen (TYLENOL) 325  MG tablet Take 2 tablets (650 mg total) by mouth every 6 (six) hours as needed for mild pain (or Fever >/= 101). 04/03/17  Yes Mathews, Elizabeth G, MD  ADVAIR DISKUS 250-50 MCG/DOSE AEPB Inhale 1 puff into the lungs 2 (two) times daily.  11/13/14  Yes [provider]  calcium carbonate (TUMS - DOSED IN MG ELEMENTAL CALCIUM) 500 MG chewable tablet Chew 500 mg by mouth daily as needed for heartburn.    Yes [provider]  digoxin (LANOXIN) 0.125 MG tablet Take 1 tablet (0.125 mg total) by mouth daily. 02/10/17  Yes Short, Mackenzie, MD  fluticasone (FLONASE) 50 MCG/ACT nasal spray Place 2 sprays into both nostrils daily. Patient taking differently: Place 1 spray into both nostrils daily as needed for allergies.  01/04/17  Yes Fry, Stephen A, MD  furosemide (LASIX) 40 MG tablet Take 0.5 tablets (20 mg total) by mouth daily. May take an extra tab daily as needed for swelling 04/03/17  Yes Mathews, Elizabeth G, MD  gabapentin (NEURONTIN) 100 MG capsule TAKE 1 CAPSULE (100MG) TO 3 CAPSULES (300MG) EACH NIGHT AS NEEDED FOR MUSCLE PAINS Patient taking differently: Take 100-300 mg by mouth at bedtime as needed (for mucle pains).  10/05/16  Yes Fry, Stephen A, MD  lisinopril (PRINIVIL,ZESTRIL) 10 MG tablet Take 0.5 tablets (5 mg total) by mouth daily. 04/03/17  Yes Mathews, Elizabeth G, MD  loratadine (CLARITIN) 10 MG tablet Take 10 mg by mouth daily as needed for allergies.    Yes [provider]  metoprolol succinate (TOPROL-XL) 25 MG 24 hr tablet Take 1 tablet (25 mg total) by mouth daily. 02/10/17  Yes Short, Mackenzie, MD  nitroGLYCERIN (NITROSTAT) 0.4 MG SL tablet PLACE ONE UNDER TONGUE FOR CHEST PAIN. Patient taking differently: Place 0.4 mg under tongue as needed for chest pain 11/22/16  Yes Jordan, Peter M, MD  phenol (CHLORASEPTIC) 1.4 % LIQD Use as directed 1 spray in the mouth or throat as needed for throat irritation / pain. 04/04/17  Yes Mathews, Elizabeth G, MD  potassium chloride  (K-DUR,KLOR-CON) 10 MEQ tablet Take 1 tablet (10 mEq total) by mouth daily. 01/09/13  Yes Jordan, Peter M, MD  traMADol (ULTRAM) 50 MG tablet TAKE 2 TABLETS BY MOUTH EVERY 8 HOURS AS NEEDED   FOR PAIN Patient taking differently: Take 100 mg by mouth every 8 (eight) hours as needed for moderate pain.  03/24/17  Yes [name], MD  RESTASIS 0.05 % ophthalmic emulsion INSTILL 1 DROP INTO BOTH EYES TWICE A DAY Patient not taking: Reported on 04/01/2017 11/17/16   [name], MD   Allergies  Allergen Reactions  . Tamsulosin Other (See Comments)    Dizziness, Made BP very low and weakness  . Celebrex [Celecoxib] Hives and Nausea And Vomiting    Gi upset  . Dronedarone Nausea And Vomiting and Other (See Comments)    GI upset, abdominal pain  . Esomeprazole Magnesium Hives and Other (See Comments)    "don't really remember"  . Digoxin Diarrhea    May have caused some diarrhea  . Hydrocodone-Acetaminophen Other (See Comments)    Bad headache  . Protonix [Pantoprazole Sodium] Nausea And Vomiting and Other (See Comments)    Tolerates Dexilant   Review of Systems  Unable to perform ROS: Acuity of condition    Physical Exam  Constitutional: He appears well-developed.  Elderly male, very weak, no acute distress; appears ill  HENT:  Head: Normocephalic and atraumatic.  Neck: Normal range of motion.  Cardiovascular: Normal rate.  Pulmonary/Chest:  Short of breath with any exertion  Abdominal: Soft.  Neurological: He is alert.  Oriented to self, understands he is in the hospital and remembers being told that he has pneumonia and the flu  Skin: Skin is warm and dry. There is pallor.  Psychiatric:  Affect constricted  Nursing note and vitals reviewed.   Vital Signs: BP 124/71 (BP Location: Left Arm)   Pulse 88   Temp 98 F (36.7 C) (Oral)   Resp 20   Ht 6' 1" (1.854 m)   Wt 73.1 kg (161 lb 1.6 oz)   SpO2 96%   BMI 21.25 kg/m  Pain Assessment: No/denies pain   Pain Score: 0-No  pain   SpO2: SpO2: 96 % O2 Device:SpO2: 96 % O2 Flow Rate: .   IO: Intake/output summary:   Intake/Output Summary (Last 24 hours) at 04/14/2017 1216 Last data filed at 04/14/2017 1308 Gross per 24 hour  Intake 450 ml  Output 2 ml  Net 448 ml    LBM: Last BM Date: 04/13/17 Baseline Weight: Weight: 76.7 kg (169 lb) Most recent weight: Weight: 73.1 kg (161 lb 1.6 oz)     Palliative Assessment/Data:   Flowsheet Rows     Most Recent Value  Intake Tab  Referral Department  Hospitalist  Unit at Time of Referral  Med/Surg Unit  Palliative Care Primary Diagnosis  Sepsis/Infectious Disease  Date Notified  04/13/17  Palliative Care Type  New Palliative care  Reason for referral  Clarify Goals of Care, Psychosocial or Spiritual support  Date of Admission  04/07/17  Date first seen by Palliative Care  04/14/17  # of days Palliative referral response time  1 Day(s)  # of days IP prior to Palliative referral  6  Clinical Assessment  Palliative Performance Scale Score  40%  Pain Max last 24 hours  Not able to report  Pain Min Last 24 hours  Not able to report  Dyspnea Max Last 24 Hours  Not able to report  Dyspnea Min Last 24 hours  Not able to report  Nausea Max Last 24 Hours  Not able to report  Nausea Min Last 24 Hours  Not able to report  Anxiety Max Last 24 Hours  Not  able to report  Anxiety Min Last 24 Hours  Not able to report  Other Max Last 24 Hours  Not able to report  Psychosocial & Spiritual Assessment  Palliative Care Outcomes  Patient/Family meeting held?  Yes  Who was at the meeting?  pt  Palliative Care follow-up planned  Yes, Facility      Time In: 1130 Time Out: 1230 Time Total: 60 min Greater than 50%  of this time was spent counseling and coordinating care related to the above assessment and plan.  Signed by:  Grace , NP   Please contact Palliative Medicine Team phone at [phone] for questions and concerns.  For individual provider: See  Amion             

## 2017-04-14 NOTE — Progress Notes (Addendum)
Physical Therapy Treatment Patient Details Name: [name] MRN: [id number] DOB: [birth year] Today's Date: 04/14/2017    History of Present Illness 84 y.o. male with medical history significant of afib not on anticoagulation due to prior subdural hemorrhage, PE, hypertension, hyperlipidemia, diet-controlled diabetes, asthma, GERD, SDH, CAD, s/p of CABG and stent placement, sCHF with EF 35%, AICD placement, who presents with cough, chest pain and fever.  Admitted for HCAP complicated by influenza A infection.    PT Comments    Pt performed increased activity during session including repeated sit to stand reps.  Pt remains to require min assistance with mobility and continues to benefit from short term rehab to improve strength and function.  Plan for SNF remains appropriate.  Increased WOB during gait training, decreased SPO2 to 84% on RA, increased to 93% on RA with seated rest break.     Follow Up Recommendations  SNF;Supervision/Assistance - 24 hour     Equipment Recommendations  None recommended by PT    Recommendations for Other Services       Precautions / Restrictions Precautions Precautions: Fall Precaution Comments: Hx of falls at home  Restrictions Weight Bearing Restrictions: No    Mobility  Bed Mobility Overal bed mobility: Needs Assistance Bed Mobility: Supine to Sit     Supine to sit: Min assist;HOB elevated     General bed mobility comments: +rail, assist elevating trunk sup to sit, assist with BLE sit to sup  Transfers Overall transfer level: Needs assistance Equipment used: Rolling walker (2 wheeled) Transfers: Sit to/from Stand Sit to Stand: Min assist         General transfer comment: verbal cues for hand placement. Assist to power up. Pt performed from edge of bed and from commode.  Use of grab bar on L side of wall.  Pt required multiple attempts to complete transfers.    Ambulation/Gait Ambulation/Gait assistance: Min guard Ambulation  Distance (Feet): 100 Feet Assistive device: Rolling walker (2 wheeled) Gait Pattern/deviations: Step-through pattern;Decreased stride length;Shuffle;Trunk flexed Gait velocity: Decreased Gait velocity interpretation: Below normal speed for age/gender General Gait Details: Pt remains to require cues for upper trunk control and safety with RW including obstacle negotiation.     Stairs            Wheelchair Mobility    Modified Rankin (Stroke Patients Only)       Balance Overall balance assessment: Needs assistance Sitting-balance support: No upper extremity supported;Feet supported Sitting balance-Leahy Scale: Good       Standing balance-Leahy Scale: Fair Standing balance comment: Pt able to stand statically to pull his pants up.                              Cognition Arousal/Alertness: Awake/alert Behavior During Therapy: WFL for tasks assessed/performed Overall Cognitive Status: No family/caregiver present to determine baseline cognitive functioning                                 General Comments: He appears appropriate during session this am.        Exercises      General Comments        Pertinent Vitals/Pain Pain Assessment: 0-10 Pain Score: 5  Pain Location: headache Pain Descriptors / Indicators: Aching Pain Intervention(s): Monitored during session;Repositioned    Home Living  Prior Function            PT Goals (current goals can now be found in the care plan section) Acute Rehab PT Goals Patient Stated Goal: feel better Potential to Achieve Goals: Good Progress towards PT goals: Progressing toward goals    Frequency    Min 3X/week      PT Plan Current plan remains appropriate    Co-evaluation              AM-PAC PT "6 Clicks" Daily Activity  Outcome Measure    Difficulty moving from lying on back to sitting on the side of the bed? : Unable Difficulty sitting down on  and standing up from a chair with arms (e.g., wheelchair, bedside commode, etc,.)?: Unable Help needed moving to and from a bed to chair (including a wheelchair)?: A Little Help needed walking in hospital room?: A Little Help needed climbing 3-5 steps with a railing? : A Lot 6 Click Score: 10    End of Session Equipment Utilized During Treatment: Gait belt Activity Tolerance: Patient tolerated treatment well Patient left: in bed;with call bell/phone within reach;with nursing/sitter in room Nurse Communication: Mobility status PT Visit Diagnosis: Unsteadiness on feet (R26.81);Muscle weakness (generalized) (M62.81);History of falling (Z91.81)     Time: 7096-2836 PT Time Calculation (min) (ACUTE ONLY): 29 min  Charges:  $Gait Training: 8-22 mins $Therapeutic Activity: 8-22 mins                    G Codes:       [name], PTA pager [phone]    [name] 04/14/2017, 10:17 AM

## 2017-04-15 DIAGNOSIS — I5022 Chronic systolic (congestive) heart failure: Secondary | ICD-10-CM

## 2017-04-15 DIAGNOSIS — I482 Chronic atrial fibrillation: Secondary | ICD-10-CM

## 2017-04-15 LAB — GLUCOSE, CAPILLARY
GLUCOSE-CAPILLARY: 146 mg/dL — AB (ref 65–99)
GLUCOSE-CAPILLARY: 138 mg/dL — AB (ref 65–99)
Glucose-Capillary: 147 mg/dL — ABNORMAL HIGH (ref 65–99)
Glucose-Capillary: 141 mg/dL — ABNORMAL HIGH (ref 65–99)
Glucose-Capillary: 156 mg/dL — ABNORMAL HIGH (ref 65–99)

## 2017-04-15 LAB — BASIC METABOLIC PANEL
ANION GAP: 9 (ref 5–15)
BUN: 11 mg/dL (ref 6–20)
CHLORIDE: 109 mmol/L (ref 101–111)
CO2: 24 mmol/L (ref 22–32)
Calcium: 8 mg/dL — ABNORMAL LOW (ref 8.9–10.3)
Creatinine, Ser: 0.88 mg/dL (ref 0.61–1.24)
GFR calc Af Amer: >60 mL/min (ref >60)
GFR calc non Af Amer: >60 mL/min (ref >60)
GLUCOSE: 138 mg/dL — AB (ref 65–99)
Potassium: 3.8 mmol/L (ref 3.5–5.1)
SODIUM: 142 mmol/L (ref 135–145)

## 2017-04-15 LAB — MAGNESIUM: MAGNESIUM: 1.9 mg/dL (ref 1.7–2.4)

## 2017-04-15 MED ORDER — METOPROLOL TARTRATE 25 MG/10 ML ORAL SUSPENSION
12.5000 mg | Freq: Two times a day (BID) | ORAL | Status: DC
  Administered 2017-04-15 – 2017-04-20 (×10): 12.5 mg
  Filled 2017-04-15 (×12): qty 5

## 2017-04-15 MED ORDER — GUAIFENESIN-DM 100-10 MG/5ML PO SYRP
15.0000 mL | ORAL_SOLUTION | Freq: Three times a day (TID) | ORAL | Status: DC
  Administered 2017-04-15 – 2017-04-26 (×42): 15 mL
  Filled 2017-04-15 (×42): qty 15

## 2017-04-15 NOTE — Progress Notes (Signed)
Patients HR goes into 150's when ambulating.

## 2017-04-15 NOTE — Progress Notes (Signed)
Daily Progress Note   Patient Name: MAHER SHON       Date: 04/15/2017 DOB: Aug 12, 1932  Age: 84 y.o. MRN#: [id number] Attending Physician: Dessa Phi, DO Primary Care Physician: [name], MD Admit Date: 04/07/2017  Reason for Consultation/Follow-up: Establishing goals of care and Psychosocial/spiritual support  Subjective: Chart reviewed, met with patient.  Patient is alert and appears more focused today.  He understood he was in the hospital with pneumonia, flu, year and month.  He does have some short-term memory deficits but overall participated in goals of care specifically regarding PEG tubes appropriately.  Mr. Marban and I conversed at length about how he viewed his quality of life, where he finds meaning, in the context of aggressive medical interventions such as artificial feeding.  Mr. Isabella Stalling initially talked about a PEG tube in terms of "I do not really have a choice".  I did explain to him that in fact he does have a choice, and that would be eating for pleasure, comfort which a lot of people elect to do knowing the risk of aspirating versus artificial feeding.  He asked good questions in terms of would a feeding tube be permanent and I prepared him that it would be.  He asked questions regarding the procedure that were appropriate.  I also explained to him that the insertion of a PEG tube would not fully eliminate the risk of aspiration nor would it provide the enjoyment of eating.  I expressed to him the benefit may be, that it was not an assurance however, extend his life.  He stated clearly that his quality of life, where he finds meaning, has diminished greatly as he has aged.  He showed good insight in that he is having trouble managing at home today and states "I would  go some place today if it would make me feel more secure".  He does understand that administering tube feedings on his own would likely be beyond his capacity.  We did talk about skilled nursing facilities initially for rehab as well as a place for him to consider residing.  He was receptive to this and was able to verbalize that he might even find benefit in that  he would not be is isolated  Length of Stay: 8  Current Medications: Scheduled Meds:  . digoxin  0.125 mg Oral Daily  . docusate  50 mg Per Tube BID  . feeding supplement (PRO-STAT SUGAR FREE 64)  30 mL Per Tube Daily  . free water  150 mL Per Tube Q4H  . guaiFENesin-dextromethorphan  15 mL Per Tube TID AC & HS  . metoprolol tartrate  12.5 mg Per Tube BID  . sennosides  5 mL Per Tube BID    Continuous Infusions: . feeding supplement (JEVITY 1.5 CAL/FIBER) 1,000 mL (04/15/17 0445)    PRN Meds: acetaminophen, calcium carbonate, fluticasone, gabapentin, ipratropium-albuterol, loratadine, morphine injection, nitroGLYCERIN, phenol  Physical Exam  Constitutional: He is oriented to person, place, and time.  Frail, elderly man; in no acute distress.  Appears very weak  HENT:  Head: Normocephalic and atraumatic.  Cardiovascular:  Tachycardic with exertion  Pulmonary/Chest:  Short of breath with exertion  Neurological: He is alert and oriented to person, place, and time.  Skin: Skin is warm and dry. There is pallor.  Psychiatric:  Affect constricted Thought processes organized  Nursing note and vitals reviewed.           Vital Signs: BP 114/71 (BP Location: Right Arm)   Pulse 87   Temp (!) 97.5 F (36.4 C) (Oral)   Resp 20   Ht _0  (1.854 m)   Wt 73.5 kg (162 lb 1.6 oz)   SpO2 97%   BMI 21.39 kg/m  SpO2: SpO2: 97 % O2 Device: O2 Device: Room Air O2 Flow Rate:    Intake/output summary:   Intake/Output Summary (Last 24 hours) at 04/15/2017 1722 Last data filed at 04/15/2017 1652 Gross per 24 hour  Intake 1500  ml  Output 1025 ml  Net 475 ml   LBM: Last BM Date: 04/14/17 Baseline Weight: Weight: 76.7 kg (169 lb) Most recent weight: Weight: 73.5 kg (162 lb 1.6 oz)       Palliative Assessment/Data:    Flowsheet Rows     Most Recent Value  Intake Tab  Referral Department  Hospitalist  Unit at Time of Referral  Med/Surg Unit  Palliative Care Primary Diagnosis  Sepsis/Infectious Disease  Date Notified  04/13/17  Palliative Care Type  New Palliative care  Reason for referral  Clarify Goals of Care, Psychosocial or Spiritual support  Date of Admission  04/07/17  Date first seen by Palliative Care  04/14/17  # of days Palliative referral response time  1 Day(s)  # of days IP prior to Palliative referral  6  Clinical Assessment  Palliative Performance Scale Score  40%  Pain Max last 24 hours  Not able to report  Pain Min Last 24 hours  Not able to report  Dyspnea Max Last 24 Hours  Not able to report  Dyspnea Min Last 24 hours  Not able to report  Nausea Max Last 24 Hours  Not able to report  Nausea Min Last 24 Hours  Not able to report  Anxiety Max Last 24 Hours  Not able to report  Anxiety Min Last 24 Hours  Not able to report  Other Max Last 24 Hours  Not able to report  Psychosocial & Spiritual Assessment  Palliative Care Outcomes  Patient/Family meeting held?  Yes  Who was at the meeting?  pt  Palliative Care follow-up planned  Yes, Facility      Patient Active Problem List   Diagnosis Date Noted  . Goals of care, counseling/discussion   . Palliative care by specialist   . Malnutrition of moderate degree 04/13/2017  .  Sepsis ([place]) 04/07/2017  . Influenza A 04/07/2017  . CKD (chronic kidney disease), stage III ([place]) 04/07/2017  . Acute respiratory failure with hypoxia ([place]) 04/07/2017  . Syncope 04/01/2017  . Acute kidney injury ([place]) 04/01/2017  . Thyroid nodule 04/01/2017  . Chronic pulmonary embolism ([place]) 03/27/2017  . Acute confusion 03/04/2017  . Failure of  implantable cardioverter-defibrillator (ICD) lead 03/02/2017  . Rectal ulcer   . Rectal bleeding   . Proctitis 02/07/2017  . Abdominal pain 02/06/2017  . Acute radiation proctitis 02/06/2017  . Failure of implantable cardioverter-defibrillator lead 11/10/2016  . ICD (implantable cardioverter-defibrillator) in place 04/08/2016  . Gastroesophageal reflux disease without esophagitis 12/31/2013  . Hypotension 09/23/2011  . HCAP (healthcare-associated pneumonia) 09/23/2011  . SIRS (systemic inflammatory response syndrome) ([place]) 09/23/2011  . Thrombocytopenia ([place]) 09/23/2011  . History of subdural hemorrhage 09/08/2011  . Ataxia 08/17/2011  . Dehydration 08/17/2011  . Atrial fibrillation with controlled ventricular response ([place]) 08/13/2011  . Colon polyp 07/07/2011  . Angiodysplasia of colon 07/07/2011  . GERD (gastroesophageal reflux disease) 05/31/2011  . Anemia, iron deficiency 12/29/2010  . Chronic anticoagulation discontinued July 2013 after developed subdural hematoma 09/22/2010  . Bradycardia 09/22/2010  . CAD (coronary artery disease) 06/25/2010  . ventricular tachycardia 12/30/2009  . Chronic atrial fibrillation ([place]) 12/30/2009  . Chronic systolic heart failure- EF 35-40% 12/30/2009  . Automatic implantable cardioverter-defibrillator in situ 12/30/2009    Palliative Care Assessment & Plan   Patient Profile: 84 y.o. male  with past medical history of atrial fib, subdural hematoma, PE, hypertension, hyperlipidemia, diabetes, GERD, coronary artery disease status post CABG, AICD, CHF with an ejection fraction of 35% admitted on 04/07/2017 with cough, weakness.  Patient was found to have right lower lobe pneumonia and was positive for flu a.  Patient has had 6 hospitalizations in the past 6 months as well as 4 emergency room visits secondary to declining health.  This is the first time palliative medicine team has met with Mr. Coppedge  Consult ordered for goals of care.      Assessment: I attempted to reach his son Adryan, who lives in [place].  I was able to reach his wife, Amy.  She and I spoke briefly and arranged for a time on 04/16/2017 for a telephone conference at 3 PM [place] time    Recommendations/Plan:  At this point it appears as though patient is wanting to proceed with a PEG tube if medically safe to do so.  I will discuss this with his son on 04/16/2017 but I do not anticipate him overriding his father's decisions  Mr. Isabella Stalling is going to need further help with advanced directives specifically CODE STATUS as well as AICD.  This will also be something that I will discuss with his son in an attempt to prepare him for the type of medical decisions that his father will need help with  Discharge to a skilled nursing facility appears to be agreeable to the patient and would recommend that a palliative medicine provider in the community continue to follow patient in facility to continue with goals of care discussion regarding CODE STATUS as well as the activation of defibrillator  Goals of Care and Additional Recommendations:  Limitations on Scope of Treatment: Full Scope Treatment  Code Status:    Code Status Orders  (From admission, onward)        Start     Ordered   04/07/17 0615  Full code  Continuous     04/07/17 0615  Code Status History    Date Active Date Inactive Code Status Order ID Comments User Context   04/01/2017 15:39 04/04/2017 15:26 Full Code [id number]  Radene Gunning, NP ED   03/27/2017 00:37 03/29/2017 19:58 Full Code [id number]  Etta Quill, DO ED   03/02/2017 16:57 03/05/2017 17:04 Full Code [id number]  Evans Lance, MD Inpatient   02/06/2017 23:06 02/10/2017 15:31 Full Code [id number]  Theodis Blaze, MD Inpatient   11/10/2016 20:31 11/14/2016 14:56 Full Code [id number]  Jacolyn Reedy, MD ED   04/08/2016 14:28 04/09/2016 14:11 Full Code [id number]  Evans Lance, MD Inpatient   09/26/2011 12:52 10/11/2011 19:49 Full  Code [id number]  Samella Parr, NP Inpatient       Prognosis:   Unable to determine  Discharge Planning:  [facility] for rehab with Palliative care service follow-up  Care plan was discussed with Dr. Maylene Roes  Thank you for allowing the Palliative Medicine Team to assist in the care of this patient.   Time In: 1700 Time Out: 1730 Total Time 30 min Prolonged Time Billed  no       Greater than 50%  of this time was spent counseling and coordinating care related to the above assessment and plan.  Dory Horn, NP  Please contact Palliative Medicine Team phone at [phone] for questions and concerns.

## 2017-04-15 NOTE — Progress Notes (Signed)
PROGRESS NOTE    [name]  ERX:[id number] DOB: 1932/02/16 DOA: 04/07/2017 PCP: [name], MD     Brief Narrative:  Joshua Grinder Ludwickis a 84 y.o.malewith medical history significant ofafibnot on anticoagulation due to prior subdural hemorrhage,PE,hypertension, hyperlipidemia, diet-controlled diabetes, asthma, GERD, SDH, CAD,s/p ofCABGandstent placement,sCHFwith EF 35%, AICD placement, who presents with cough, chest pain and fever of 102.1. He was found to have right lower lobe infiltrates. He was admitted for HCAP complicated by influenza A infection.  He has been treated with IV antibiotics and Tamiflu.  Throughout hospitalization, he has been intermittently confused.  Swallow evaluation revealed severe dysphagia.  He was kept n.p.o.  Eventually, cortrack was placed on 3/14 for nutrition.  Palliative care consulted for goals of care.  Assessment & Plan:   Principal Problem:   Influenza A Active Problems:   Chronic atrial fibrillation (H[facility])   Chronic systolic heart failure- EF 35-40%   CAD (coronary artery disease)   HCAP (healthcare-associated pneumonia)   Thrombocytopenia (H[facility])   ICD (implantable cardioverter-defibrillator) in place   Sepsis ([place])   CKD (chronic kidney disease), stage III ([place])   Acute respiratory failure with hypoxia ([facility])   Malnutrition of moderate degree   Goals of care, counseling/discussion   Palliative care by specialist   Acute hypoxic respiratory failure 2/2 to HCAP, Influenza A, and acute on chronic aspiration -Completed 5 days of IV cefepime and Tamiflu -Now on room air   Acute metabolic encephalopathy most likely multifactorial, resolved -Suspect underlying dementia plus infective process with HCAP/influenza A -Stable  Acute on chronic severe dysphagia -Modified barium swallow evaluation 04/10/2017 -N.p.o. recommended by speech therapy 04/11/2017 -Started cortrack 04/13/2017 for medication, nutrition and hydration -Dietary  consult for tube feeding recommendations -?PEG. Today, patient tells me to "do whatever we need to do" to get him home.  I attempted to address what the PEG tube would entail, how he would be nothing by mouth and would be dependent on PEG tube for feeding indefinitely due to his aspiration risk.  I am not sure the patient comprehends the long-term consequence of this decision.  Attempted to contact son without success.  Palliative care is involved  Chronic A Fib -Not on anticoagulation due to hx GI bleed -Continue digoxin and metoprolol  -Rate controlled  Polyneuropathy -Continue gabapentin  Chronic systolic HFREF 95% post AICD -Continue lopressor  -Monitor urine output, I&Os, daily weight   DVT prophylaxis: SCD Code Status: Full Family Communication: No family at bedside, called son but no answer Disposition Plan: Pending further goals of care discussion.  Patient has severe dysphagia, is currently getting nutrition via cortrack.  I suspect he is a very poor candidate for PEG tube.  Family has been attempted to be contacted without success.  Palliative care has been consulted. At the very least, he will need SNF on discharge, but may be hospice appropriate    Consultants:   none  Procedures:   none  Antimicrobials:  Anti-infectives (From admission, onward)   Start     Dose/Rate Route Frequency Ordered Stop   04/10/17 1300  oseltamivir (TAMIFLU) capsule 75 mg  Status:  Discontinued     75 mg Oral 2 times daily 04/10/17 1257 04/11/17 1247   04/08/17 2200  ceFEPIme (MAXIPIME) 1 g in sodium chloride 0.9 % 100 mL IVPB  Status:  Discontinued     1 g 200 mL/hr over 30 Minutes Intravenous Every 12 hours 04/08/17 1212 04/11/17 1247   04/08/17 0000  ceFEPIme (MAXIPIME)  1 g in sodium chloride 0.9 % 100 mL IVPB  Status:  Discontinued     1 g 200 mL/hr over 30 Minutes Intravenous Every 24 hours 04/07/17 0615 04/08/17 1212   04/07/17 1600  vancomycin (VANCOCIN) IVPB 750 mg/150 ml  premix  Status:  Discontinued     750 mg 150 mL/hr over 60 Minutes Intravenous Every 12 hours 04/07/17 0638 04/08/17 1657   04/07/17 1000  oseltamivir (TAMIFLU) capsule 75 mg  Status:  Discontinued     75 mg Oral 2 times daily 04/07/17 0602 04/07/17 0636   04/07/17 1000  oseltamivir (TAMIFLU) capsule 30 mg  Status:  Discontinued     30 mg Oral 2 times daily 04/07/17 0636 04/10/17 1257   04/07/17 0300  vancomycin (VANCOCIN) 1,500 mg in sodium chloride 0.9 % 500 mL IVPB     1,500 mg 250 mL/hr over 120 Minutes Intravenous  Once 04/07/17 0220 04/07/17 0524   04/07/17 0230  ceFEPIme (MAXIPIME) 2 g in sodium chloride 0.9 % 100 mL IVPB     2 g 200 mL/hr over 30 Minutes Intravenous  Once 04/07/17 0218 04/07/17 0314   04/07/17 0230  vancomycin (VANCOCIN) IVPB 1000 mg/200 mL premix  Status:  Discontinued     1,000 mg 200 mL/hr over 60 Minutes Intravenous  Once 04/07/17 0218 04/07/17 0220       Subjective: Without any complaints today.  Objective: Vitals:   04/14/17 1123 04/14/17 2015 04/15/17 0449 04/15/17 0501  BP: 124/71 135/70 129/71   Pulse: 88 92 98   Resp: 20 20 18    Temp: 98 F (36.7 C) (!) 97.3 F (36.3 C) 97.6 F (36.4 C)   TempSrc: Oral Oral Oral   SpO2: 96% 96% 94%   Weight:    73.5 kg (162 lb 1.6 oz)  Height:        Intake/Output Summary (Last 24 hours) at 04/15/2017 1321 Last data filed at 04/15/2017 1130 Gross per 24 hour  Intake 860 ml  Output 225 ml  Net 635 ml   Filed Weights   04/13/17 0603 04/14/17 0600 04/15/17 0501  Weight: 73.3 kg (161 lb 9.6 oz) 73.1 kg (161 lb 1.6 oz) 73.5 kg (162 lb 1.6 oz)    Examination:  General exam: Appears calm and comfortable  Respiratory system: Clear to auscultation. Respiratory effort normal. Cardiovascular system: S1 & S2 heard, Irreg rhythm. No JVD, murmurs, rubs, gallops or clicks. No pedal edema. Gastrointestinal system: Abdomen is nondistended, soft and nontender. No organomegaly or masses felt. Normal bowel sounds  heard. +Cortrak in place  Central nervous system: Alert. No focal neurological deficits. Extremities: Symmetric 5 x 5 power. Skin: No rashes, lesions or ulcers Psychiatry: ?Dementia, appears slightly confused   Data Reviewed: I have personally reviewed following labs and imaging studies  CBC: Recent Labs  Lab 04/09/17 0317 04/10/17 0927 04/13/17 1634  WBC 9.0 10.3 9.4  HGB 11.5* 12.5* 13.8  HCT 35.1* 37.2* 42.6  MCV 91.9 91.9 94.0  PLT 120* 138* 008   Basic Metabolic Panel: Recent Labs  Lab 04/09/17 0317 04/10/17 0927 04/13/17 1634 04/14/17 0724  NA 133* 138 144 144  K 3.5 3.4* 3.3* 3.1*  CL 104 106 107 110  CO2 19* 19* 22 21*  GLUCOSE 88 89 115* 158*  BUN 9 10 11 12   CREATININE 0.80 0.96 1.04 0.94  CALCIUM 7.5* 8.2* 8.7* 8.4*  MG 1.7  --   --  1.5*   GFR: Estimated Creatinine Clearance: 63 mL/min (by  C-G formula based on SCr of 0.94 mg/dL). Liver Function Tests: No results for input(s): AST, ALT, ALKPHOS, BILITOT, PROT, ALBUMIN in the last 168 hours. No results for input(s): LIPASE, AMYLASE in the last 168 hours. No results for input(s): AMMONIA in the last 168 hours. Coagulation Profile: No results for input(s): INR, PROTIME in the last 168 hours. Cardiac Enzymes: No results for input(s): CKTOTAL, CKMB, CKMBINDEX, TROPONINI in the last 168 hours. BNP (last 3 results) No results for input(s): PROBNP in the last 8760 hours. HbA1C: No results for input(s): HGBA1C in the last 72 hours. CBG: Recent Labs  Lab 04/14/17 1615 04/14/17 2320 04/15/17 0501 04/15/17 0756 04/15/17 1143  GLUCAP 181* 155* 146* 147* 141*   Lipid Profile: No results for input(s): CHOL, HDL, LDLCALC, TRIG, CHOLHDL, LDLDIRECT in the last 72 hours. Thyroid Function Tests: No results for input(s): TSH, T4TOTAL, FREET4, T3FREE, THYROIDAB in the last 72 hours. Anemia Panel: No results for input(s): VITAMINB12, FOLATE, FERRITIN, TIBC, IRON, RETICCTPCT in the last 72 hours. Sepsis  Labs: Recent Labs  Lab 04/09/17 0317  PROCALCITON 0.17    Recent Results (from the past 240 hour(s))  Blood Culture (routine x 2)     Status: None   Collection Time: 04/07/17  2:20 AM  Result Value Ref Range Status   Specimen Description BLOOD RIGHT ANTECUBITAL  Final   Special Requests IN PEDIATRIC BOTTLE Blood Culture adequate volume  Final   Culture   Final    NO GROWTH 5 DAYS Performed at [facility], [address] [place], [place] [address]    Report Status 04/12/2017 FINAL  Final  Blood Culture (routine x 2)     Status: None   Collection Time: 04/07/17  2:35 AM  Result Value Ref Range Status   Specimen Description BLOOD RIGHT HAND  Final   Special Requests IN PEDIATRIC BOTTLE Blood Culture adequate volume  Final   Culture   Final    NO GROWTH 5 DAYS Performed at [facility], [place] [address] [place], [place] [address]    Report Status 04/12/2017 FINAL  Final  Culture, Urine     Status: Abnormal   Collection Time: 04/07/17  6:06 PM  Result Value Ref Range Status   Specimen Description URINE, RANDOM  Final   Special Requests NONE  Final   Culture (A)  Final    <10,000 COLONIES/mL Performed at [facility], [place] [address], [place], [place] [address]    Report Status 04/09/2017 FINAL  Final  Culture, sputum-assessment     Status: None   Collection Time: 04/08/17 12:51 PM  Result Value Ref Range Status   Specimen Description SPUTUM  Final   Special Requests NONE  Final   Sputum evaluation   Final    Sputum specimen not acceptable for testing.  Please recollect.   Gram Stain Report Called to,Read Back By and Verified With: R ARYAL,RN AT 1507 04/08/17 BY L BENFIELD Performed at [facility], [place] [address] [place], [place] [address]    Report Status 04/08/2017 FINAL  Final       Radiology Studies: No results found.    Scheduled Meds: . digoxin  0.125 mg Oral Daily  . docusate  50 mg Per Tube BID  . feeding supplement (PRO-STAT  SUGAR FREE 64)  30 mL Per Tube Daily  . free water  150 mL Per Tube Q4H  . guaiFENesin-dextromethorphan  15 mL Per Tube TID AC & HS  .  metoprolol tartrate  12.5 mg Per Tube BID  . sennosides  5 mL Per Tube BID   Continuous Infusions: . feeding supplement (JEVITY 1.5 CAL/FIBER) 1,000 mL (04/15/17 0445)     LOS: 8 days    Time spent: 25 minutes   Dessa Phi, DO [facility] [email or web address] Password [email or web address] 04/15/2017, 1:21 PM

## 2017-04-15 NOTE — Plan of Care (Signed)
  Nutrition: Adequate nutrition will be maintained 04/15/2017 0257 - Progressing by Tristan Schroeder, RN   Clinical Measurements: Ability to maintain clinical measurements within normal limits will improve 04/15/2017 0257 - Progressing by Tristan Schroeder, RN   Health Behavior/Discharge Planning: Ability to manage health-related needs will improve 04/15/2017 0257 - Progressing by Tristan Schroeder, RN   Education: Knowledge of General Education information will improve 04/15/2017 0257 - Progressing by Tristan Schroeder, RN

## 2017-04-16 LAB — GLUCOSE, CAPILLARY
GLUCOSE-CAPILLARY: 122 mg/dL — AB (ref 65–99)
GLUCOSE-CAPILLARY: 132 mg/dL — AB (ref 65–99)
GLUCOSE-CAPILLARY: 142 mg/dL — AB (ref 65–99)
Glucose-Capillary: 130 mg/dL — ABNORMAL HIGH (ref 65–99)
Glucose-Capillary: 107 mg/dL — ABNORMAL HIGH (ref 65–99)
Glucose-Capillary: 111 mg/dL — ABNORMAL HIGH (ref 65–99)

## 2017-04-16 LAB — CBC
HCT: 38.5 % — ABNORMAL LOW (ref 39.0–52.0)
Hemoglobin: 12.2 g/dL — ABNORMAL LOW (ref 13.0–17.0)
MCH: 30.2 pg (ref 26.0–34.0)
MCHC: 31.7 g/dL (ref 30.0–36.0)
MCV: 95.3 fL (ref 78.0–100.0)
PLATELETS: 185 10*3/uL (ref 150–400)
RBC: 4.04 MIL/uL — AB (ref 4.22–5.81)
RDW: 16.6 % — AB (ref 11.5–15.5)
WBC: 6.5 10*3/uL (ref 4.0–10.5)

## 2017-04-16 LAB — BASIC METABOLIC PANEL
Anion gap: 7 (ref 5–15)
BUN: 11 mg/dL (ref 6–20)
CALCIUM: 8 mg/dL — AB (ref 8.9–10.3)
CO2: 25 mmol/L (ref 22–32)
CREATININE: 0.78 mg/dL (ref 0.61–1.24)
Chloride: 111 mmol/L (ref 101–111)
GFR calc Af Amer: >60 mL/min (ref >60)
Glucose, Bld: 115 mg/dL — ABNORMAL HIGH (ref 65–99)
POTASSIUM: 4 mmol/L (ref 3.5–5.1)
SODIUM: 143 mmol/L (ref 135–145)

## 2017-04-16 LAB — MAGNESIUM: Magnesium: 1.7 mg/dL (ref 1.7–2.4)

## 2017-04-16 MED ORDER — LORAZEPAM 2 MG/ML IJ SOLN
0.5000 mg | Freq: Every day | INTRAMUSCULAR | Status: DC | PRN
  Administered 2017-04-16 – 2017-04-25 (×8): 0.5 mg via INTRAVENOUS
  Filled 2017-04-16 (×8): qty 1

## 2017-04-16 MED ORDER — LORAZEPAM 2 MG/ML IJ SOLN
0.5000 mg | Freq: Once | INTRAMUSCULAR | Status: AC
  Administered 2017-04-16: 0.5 mg via INTRAVENOUS
  Filled 2017-04-16: qty 1

## 2017-04-16 NOTE — Progress Notes (Addendum)
PROGRESS NOTE    [name]  PXT:[id number] DOB: Oct 05, 1932 DOA: 04/07/2017 PCP: [name], MD     Brief Narrative:  Joshua Grinder Ludwickis a 84 y.o.malewith medical history significant ofafibnot on anticoagulation due to prior subdural hemorrhage,PE,hypertension, hyperlipidemia, diet-controlled diabetes, asthma, GERD, SDH, CAD,s/p ofCABGandstent placement,sCHFwith EF 35%, AICD placement, who presents with cough, chest pain and fever of 102.1. He was found to have right lower lobe infiltrates. He was admitted for HCAP complicated by influenza A infection.  He has been treated with IV antibiotics and Tamiflu.  Throughout hospitalization, he has been intermittently confused.  Swallow evaluation revealed severe dysphagia.  He was kept n.p.o.  Eventually, cortrack was placed on 3/14 for nutrition.  Palliative care consulted for goals of care.  Assessment & Plan:   Principal Problem:   Influenza A Active Problems:   Chronic atrial fibrillation (HCC)   Chronic systolic heart failure- EF 35-40%   CAD (coronary artery disease)   HCAP (healthcare-associated pneumonia)   Thrombocytopenia (HCC)   ICD (implantable cardioverter-defibrillator) in place   Sepsis ([place])   CKD (chronic kidney disease), stage III ([place])   Acute respiratory failure with hypoxia (HCC)   Malnutrition of moderate degree   Goals of care, counseling/discussion   Palliative care by specialist   Acute hypoxic respiratory failure 2/2 to HCAP, Influenza A, and acute on chronic aspiration -Completed 5 days of IV cefepime and Tamiflu -Now on room air   Acute metabolic encephalopathy most likely multifactorial, resolved -Suspect underlying dementia plus infective process with HCAP/influenza A -Stable  Acute on chronic severe dysphagia -Modified barium swallow evaluation 04/10/2017 -N.p.o. recommended by speech therapy 04/11/2017 -Started cortrack 04/13/2017 for medication, nutrition and hydration -Dietary  consult for tube feeding recommendations -?PEG.  Yesterday, patient seemed to be agreeable for PEG tube.  This morning however, he is not consistent with his decision.  When asked about PEG, he states that he needs more information and does not know anything about it.  I am not sure that he has good understanding regarding his prognosis, PEG tube care, and what this ultimately means for him.  Palliative care is involved goals of care  Chronic A Fib -Not on anticoagulation due to hx GI bleed -Continue digoxin and metoprolol  -Rate controlled  Polyneuropathy -Continue gabapentin  Chronic systolic HFREF 62% post AICD -Continue lopressor  -Monitor urine output, I&Os, daily weight  Goals of care -Spoke with son over the phone 3/17 afternoon. I discussed with him patient's medical condition, hospitalization course, severe dysphagia, NPO status, cortrak, and decision regarding PEG tube. Patient basically has an option of PEG tube placement which would lead to poor quality of life as he would be kept NPO to decrease (but not eliminate) aspiration risk. The other option is for comfort care, comfort feeding, knowing and accepting risk of aspiration and shortened life span, but with better quality of life. All questions were answered. Son wants to think about the decision, speak to patient, and will let us know in the next 1-2 days regarding decision.    DVT prophylaxis: SCD Code Status: Full Family Communication: No family at bedside, spoke with son over the phone Disposition Plan: Pending further goals of care discussion.  Patient has severe dysphagia, is currently getting nutrition via cortrack.  I suspect he is a very poor candidate for PEG tube. Palliative care has been consulted. At the very least, he will need SNF on discharge, but may be hospice appropriate    Consultants:  none  Procedures:   none  Antimicrobials:  Anti-infectives (From admission, onward)   Start     Dose/Rate  Route Frequency Ordered Stop   04/10/17 1300  oseltamivir (TAMIFLU) capsule 75 mg  Status:  Discontinued     75 mg Oral 2 times daily 04/10/17 1257 04/11/17 1247   04/08/17 2200  ceFEPIme (MAXIPIME) 1 g in sodium chloride 0.9 % 100 mL IVPB  Status:  Discontinued     1 g 200 mL/hr over 30 Minutes Intravenous Every 12 hours 04/08/17 1212 04/11/17 1247   04/08/17 0000  ceFEPIme (MAXIPIME) 1 g in sodium chloride 0.9 % 100 mL IVPB  Status:  Discontinued     1 g 200 mL/hr over 30 Minutes Intravenous Every 24 hours 04/07/17 0615 04/08/17 1212   04/07/17 1600  vancomycin (VANCOCIN) IVPB 750 mg/150 ml premix  Status:  Discontinued     750 mg 150 mL/hr over 60 Minutes Intravenous Every 12 hours 04/07/17 0638 04/08/17 1657   04/07/17 1000  oseltamivir (TAMIFLU) capsule 75 mg  Status:  Discontinued     75 mg Oral 2 times daily 04/07/17 0602 04/07/17 0636   04/07/17 1000  oseltamivir (TAMIFLU) capsule 30 mg  Status:  Discontinued     30 mg Oral 2 times daily 04/07/17 0636 04/10/17 1257   04/07/17 0300  vancomycin (VANCOCIN) 1,500 mg in sodium chloride 0.9 % 500 mL IVPB     1,500 mg 250 mL/hr over 120 Minutes Intravenous  Once 04/07/17 0220 04/07/17 0524   04/07/17 0230  ceFEPIme (MAXIPIME) 2 g in sodium chloride 0.9 % 100 mL IVPB     2 g 200 mL/hr over 30 Minutes Intravenous  Once 04/07/17 0218 04/07/17 0314   04/07/17 0230  vancomycin (VANCOCIN) IVPB 1000 mg/200 mL premix  Status:  Discontinued     1,000 mg 200 mL/hr over 60 Minutes Intravenous  Once 04/07/17 0218 04/07/17 0220       Subjective: No acute physical complaints.  Objective: Vitals:   04/15/17 2049 04/16/17 0406 04/16/17 1043 04/16/17 1117  BP: 118/74 119/83 137/72 140/85  Pulse: 84 79 97   Resp: 20 20    Temp: 97.6 F (36.4 C) 98.6 F (37 C)  97.8 F (36.6 C)  TempSrc: Oral Oral  Oral  SpO2: 95% 93%  94%  Weight:  71.2 kg (157 lb)    Height:        Intake/Output Summary (Last 24 hours) at 04/16/2017 1205 Last data  filed at 04/15/2017 2321 Gross per 24 hour  Intake 670 ml  Output 800 ml  Net -130 ml   Filed Weights   04/14/17 0600 04/15/17 0501 04/16/17 0406  Weight: 73.1 kg (161 lb 1.6 oz) 73.5 kg (162 lb 1.6 oz) 71.2 kg (157 lb)    Examination: General exam: Appears calm and comfortable  Respiratory system: Clear to auscultation. Respiratory effort normal. Cardiovascular system: S1 & S2 heard, Irreg rhythm. No JVD, murmurs, rubs, gallops or clicks. No pedal edema. Gastrointestinal system: Abdomen is nondistended, soft and nontender. No organomegaly or masses felt. Normal bowel sounds heard. +cortrak in place  Central nervous system: Alert. No focal neurological deficits. Extremities: Symmetric 5 x 5 power. Skin: No rashes, lesions or ulcers Psychiatry: Poor insight    Data Reviewed: I have personally reviewed following labs and imaging studies  CBC: Recent Labs  Lab 04/10/17 0927 04/13/17 1634 04/16/17 0721  WBC 10.3 9.4 6.5  HGB 12.5* 13.8 12.2*  HCT 37.2* 42.6 38.5*  MCV 91.9 94.0 95.3  PLT 138* 217 734   Basic Metabolic Panel: Recent Labs  Lab 04/10/17 0927 04/13/17 1634 04/14/17 0724 04/15/17 1344 04/16/17 0721  NA 138 144 144 142 143  K 3.4* 3.3* 3.1* 3.8 4.0  CL 106 107 110 109 111  CO2 19* 22 21* 24 25  GLUCOSE 89 115* 158* 138* 115*  BUN 10 11 12 11 11   CREATININE 0.96 1.04 0.94 0.88 0.78  CALCIUM 8.2* 8.7* 8.4* 8.0* 8.0*  MG  --   --  1.5* 1.9 1.7   GFR: Estimated Creatinine Clearance: 71.7 mL/min (by C-G formula based on SCr of 0.78 mg/dL). Liver Function Tests: No results for input(s): AST, ALT, ALKPHOS, BILITOT, PROT, ALBUMIN in the last 168 hours. No results for input(s): LIPASE, AMYLASE in the last 168 hours. No results for input(s): AMMONIA in the last 168 hours. Coagulation Profile: No results for input(s): INR, PROTIME in the last 168 hours. Cardiac Enzymes: No results for input(s): CKTOTAL, CKMB, CKMBINDEX, TROPONINI in the last 168 hours. BNP  (last 3 results) No results for input(s): PROBNP in the last 8760 hours. HbA1C: No results for input(s): HGBA1C in the last 72 hours. CBG: Recent Labs  Lab 04/15/17 1709 04/15/17 2044 04/16/17 0012 04/16/17 0404 04/16/17 0725  GLUCAP 156* 138* 132* 130* 107*   Lipid Profile: No results for input(s): CHOL, HDL, LDLCALC, TRIG, CHOLHDL, LDLDIRECT in the last 72 hours. Thyroid Function Tests: No results for input(s): TSH, T4TOTAL, FREET4, T3FREE, THYROIDAB in the last 72 hours. Anemia Panel: No results for input(s): VITAMINB12, FOLATE, FERRITIN, TIBC, IRON, RETICCTPCT in the last 72 hours. Sepsis Labs: No results for input(s): PROCALCITON, LATICACIDVEN in the last 168 hours.  Recent Results (from the past 240 hour(s))  Blood Culture (routine x 2)     Status: None   Collection Time: 04/07/17  2:20 AM  Result Value Ref Range Status   Specimen Description BLOOD RIGHT ANTECUBITAL  Final   Special Requests IN PEDIATRIC BOTTLE Blood Culture adequate volume  Final   Culture   Final    NO GROWTH 5 DAYS Performed at [facility], [address] [place], [place] [address]    Report Status 04/12/2017 FINAL  Final  Blood Culture (routine x 2)     Status: None   Collection Time: 04/07/17  2:35 AM  Result Value Ref Range Status   Specimen Description BLOOD RIGHT HAND  Final   Special Requests IN PEDIATRIC BOTTLE Blood Culture adequate volume  Final   Culture   Final    NO GROWTH 5 DAYS Performed at [facility], [place] [address], [place], [place] [address]    Report Status 04/12/2017 FINAL  Final  Culture, Urine     Status: Abnormal   Collection Time: 04/07/17  6:06 PM  Result Value Ref Range Status   Specimen Description URINE, RANDOM  Final   Special Requests NONE  Final   Culture (A)  Final    <10,000 COLONIES/mL Performed at [facility], [place] [address] [place], [place] [address]    Report Status 04/09/2017 FINAL  Final  Culture, sputum-assessment     Status:  None   Collection Time: 04/08/17 12:51 PM  Result Value Ref Range Status   Specimen Description SPUTUM  Final   Special Requests NONE  Final   Sputum evaluation   Final    Sputum specimen not acceptable for testing.  Please recollect.   Gram Stain Report Called  to,Read Back By and Verified With: R Ardell Isaacs AT 1507 04/08/17 BY L BENFIELD Performed at [facility], [place] [address], [place], [place] [address]    Report Status 04/08/2017 FINAL  Final       Radiology Studies: No results found.    Scheduled Meds: . digoxin  0.125 mg Oral Daily  . docusate  50 mg Per Tube BID  . feeding supplement (PRO-STAT SUGAR FREE 64)  30 mL Per Tube Daily  . free water  150 mL Per Tube Q4H  . guaiFENesin-dextromethorphan  15 mL Per Tube TID AC & HS  . metoprolol tartrate  12.5 mg Per Tube BID  . sennosides  5 mL Per Tube BID   Continuous Infusions: . feeding supplement (JEVITY 1.5 CAL/FIBER) 1,000 mL (04/16/17 0452)     LOS: 9 days    Time spent: 25 minutes   Dessa Phi, DO [facility] [email or web address] Password [facility] [place] 04/16/2017, 12:05 PM

## 2017-04-17 DIAGNOSIS — R1314 Dysphagia, pharyngoesophageal phase: Secondary | ICD-10-CM

## 2017-04-17 DIAGNOSIS — N183 Chronic kidney disease, stage 3 (moderate): Secondary | ICD-10-CM

## 2017-04-17 LAB — BASIC METABOLIC PANEL
ANION GAP: 11 (ref 5–15)
BUN: 12 mg/dL (ref 6–20)
CALCIUM: 8.2 mg/dL — AB (ref 8.9–10.3)
CO2: 23 mmol/L (ref 22–32)
Chloride: 105 mmol/L (ref 101–111)
Creatinine, Ser: 0.84 mg/dL (ref 0.61–1.24)
GFR calc Af Amer: >60 mL/min (ref >60)
GFR calc non Af Amer: >60 mL/min (ref >60)
GLUCOSE: 132 mg/dL — AB (ref 65–99)
Potassium: 4.4 mmol/L (ref 3.5–5.1)
Sodium: 139 mmol/L (ref 135–145)

## 2017-04-17 LAB — GLUCOSE, CAPILLARY
GLUCOSE-CAPILLARY: 95 mg/dL (ref 65–99)
GLUCOSE-CAPILLARY: 102 mg/dL — AB (ref 65–99)
GLUCOSE-CAPILLARY: 126 mg/dL — AB (ref 65–99)
GLUCOSE-CAPILLARY: 141 mg/dL — AB (ref 65–99)
Glucose-Capillary: 114 mg/dL — ABNORMAL HIGH (ref 65–99)

## 2017-04-17 LAB — TROPONIN I: Troponin I: <0.03 ng/mL (ref <0.03)

## 2017-04-17 LAB — MAGNESIUM: Magnesium: 1.5 mg/dL — ABNORMAL LOW (ref 1.7–2.4)

## 2017-04-17 MED ORDER — MORPHINE SULFATE (PF) 2 MG/ML IV SOLN: 2.0000 mg | Freq: Once | INTRAVENOUS | Status: DC

## 2017-04-17 MED ORDER — MAGNESIUM SULFATE 2 GM/50ML IV SOLN
2.0000 g | Freq: Once | INTRAVENOUS | Status: AC
  Administered 2017-04-17: 2 g via INTRAVENOUS
  Filled 2017-04-17: qty 50

## 2017-04-17 NOTE — Progress Notes (Signed)
Pt request to speak to daughter Mickel Baas. RN called pt's daughter Mickel Baas and gave her his phone number.

## 2017-04-17 NOTE — Progress Notes (Signed)
Physical Therapy Treatment Patient Details Name: [name] MRN: [id number] DOB: [birth year] Today's Date: 04/17/2017    History of Present Illness 84 y.o. male with medical history significant of afib not on anticoagulation due to prior subdural hemorrhage, PE, hypertension, hyperlipidemia, diet-controlled diabetes, asthma, GERD, SDH, CAD, s/p of CABG and stent placement, sCHF with EF 35%, AICD placement, who presents with cough, chest pain and fever.  Admitted for HCAP complicated by influenza A infection.    PT Comments    Pt performed supine exercises after refusing OOB mobility.  Pt reports he does not feel well today and he is just not up for it now.  He did agree to supine exercises and reports feeling better after moving his limbs.  Pt continues to benefit from short term rehab at SNF to improve strength and function before returning home.    Follow Up Recommendations  SNF;Supervision/Assistance - 24 hour     Equipment Recommendations  None recommended by PT    Recommendations for Other Services OT consult     Precautions / Restrictions Precautions Precautions: Fall Precaution Comments: Hx of falls at home  Restrictions Weight Bearing Restrictions: No LUE Weight Bearing: Non weight bearing    Mobility  Bed Mobility Overal bed mobility: Needs Assistance             General bed mobility comments: Pt performed bridging x2 to scoot to head of bed for repositioning.  Pt declined to sit edge of bed and he reports he does not feel well.  Pt did agree to supine exercises.    Transfers Overall transfer level: (NT)                  Ambulation/Gait Ambulation/Gait assistance: (NT)               Stairs            Wheelchair Mobility    Modified Rankin (Stroke Patients Only)       Balance                                            Cognition Arousal/Alertness: Awake/alert Behavior During Therapy: WFL for tasks  assessed/performed Overall Cognitive Status: No family/caregiver present to determine baseline cognitive functioning                                        Exercises General Exercises - Lower Extremity Ankle Circles/Pumps: AROM;Both;10 reps;Supine Heel Slides: AAROM;Both;10 reps;Supine Hip ABduction/ADduction: AAROM;Both;10 reps;Supine Straight Leg Raises: AAROM;Both;10 reps;Supine    General Comments        Pertinent Vitals/Pain Pain Assessment: No/denies pain    Home Living                      Prior Function            PT Goals (current goals can now be found in the care plan section) Acute Rehab PT Goals Patient Stated Goal: feel better Potential to Achieve Goals: Good Progress towards PT goals: Progressing toward goals    Frequency    Min 3X/week      PT Plan Current plan remains appropriate    Co-evaluation              AM-PAC PT "6 Clicks"  Daily Activity  Outcome Measure  Difficulty turning over in bed (including adjusting bedclothes, sheets and blankets)?: A Little Difficulty moving from lying on back to sitting on the side of the bed? : Unable Difficulty sitting down on and standing up from a chair with arms (e.g., wheelchair, bedside commode, etc,.)?: Unable Help needed moving to and from a bed to chair (including a wheelchair)?: A Little Help needed walking in hospital room?: A Little Help needed climbing 3-5 steps with a railing? : A Lot 6 Click Score: 13    End of Session   Activity Tolerance: Patient limited by lethargy Patient left: in bed;with call bell/phone within reach;with nursing/sitter in room Nurse Communication: Mobility status PT Visit Diagnosis: Unsteadiness on feet (R26.81);Muscle weakness (generalized) (M62.81);History of falling (Z91.81)     Time: 1747-1595 PT Time Calculation (min) (ACUTE ONLY): 13 min  Charges:  $Therapeutic Exercise: 8-22 mins                    G Codes:       [name], PTA pager [phone]    [name] 04/17/2017, 5:45 PM

## 2017-04-17 NOTE — Clinical Social Work Note (Signed)
CSW continues to follow for discharge needs. CSW waiting on improvement in diet status before sending out SNF referrals to avoid denials.  Dayton Scrape, [place]

## 2017-04-17 NOTE — Progress Notes (Signed)
RN attempted to call pt's son per son's request. No answer and no voicemail option.

## 2017-04-17 NOTE — Progress Notes (Signed)
Pt had 16 beat run of vtach. Pt was asleep. RN woke pt and pt is asymptomatic. MD notified. No new orders.

## 2017-04-17 NOTE — Progress Notes (Signed)
Patient ID: [name], male   DOB: [birth year], 84 y.o.   MRN: [id number]  This NP visited patient at the bedside as a follow for palliative medcine needs and emotional suppport.  At bedside with patient and had conversation regarding current medical situation.  Patient is overwhelmed with advanced directive decisions, treatment option decisions and anticipatory care needs.  He has very little social support and lives alone in his own duplex.  It sounds like he may have a few neighbors who are support.  He tells me he has a son who lives in [place].  They talk on the phone once a week but basically "stay out of each other's lives".    We discussed possible placement of a PEG tube for nutritional and hydration support.  He has lots of questions and concerns and is having a difficulty making any definitive decision regarding but states "I guess I have no choice if I want to live".  I offered education and will follow up with [name] in the morning.   Call to son and left a message with return contact, await callback   Time in  0900        Time out 0935    Total time spent on the unit was 35 minutes  Greater than 50% of the time was spent in counseling and coordination of care  Wadie Lessen NP  Palliative Medicine Team Team Phone # [phone] Pager [phone]

## 2017-04-17 NOTE — Progress Notes (Addendum)
Nutrition Follow-up  DOCUMENTATION CODES:   Non-severe (moderate) malnutrition in context of chronic illness  INTERVENTION:   Continue Jevity 1.5@ 16m/hr and increase by 10 ml every 8hours to goal rate of 582mhr.   3056mrostat daily.  150 ml free water flush every 4 hours  Tube feeding regimen provides1900kcal (100% of needs),92grams of protein, and 912m47m H2O. (Total free water: 1812 ml)  -If pt desires PEG, will transition to bolus feedings once PEG is placed; bolus feedings through post-pyloric small bore feeding tube are contrainidcated  NUTRITION DIAGNOSIS:   Moderate Malnutrition related to chronic illness(CHF, SDH) as evidenced by mild fat depletion, moderate fat depletion, mild muscle depletion, moderate muscle depletion, percent weight loss.  Ongoing  GOAL:   Patient will meet greater than or equal to 90% of their needs  Met with TF  MONITOR:   Diet advancement, Weight trends, TF tolerance, Skin, I & O's  REASON FOR ASSESSMENT:   Consult Enteral/tube feeding initiation and management  ASSESSMENT:   [name] 84 y36. male with medical history significant of afib not on anticoagulation due to prior subdural hemorrhage, PE, hypertension, hyperlipidemia, diet-controlled diabetes, asthma, GERD, SDH, CAD, s/p of CABG and stent placement, sCHF with EF 35%, AICD placement, who presents with cough, chest pain or fever.  3/10- s/p BSE- recommend NPO 3/11- s/p MBSS- recommend continue NPO 3/12- s/p repeat BSE- recommend continued NPO with nutrition via alternative needs 3/14- cortrak tube placed; tip of tube confirmed post-pyloric, TF initiated  Pt sleeping soundly at time of visit. Jevity 1.5 currently infusing via cortrak tube at goal rate of 50 ml/hr.   Case discussed with RN; pt is tolerating TF well. She confirms that palliative care team is working on family to determine goals of care (comfort feedings vs undergoing PEG). Pt  continues to receive feedings via post-pyloric cortrak tube. It is not recommended to provide bolus feedings via post-pyloric small bore feeding tube. Post-pyloric feedings may also decrease risk of aspiration. If PEG tube placement is pursued, will transition to bolus feedings once PEG is placed.  Labs reviewed: K WDL. Mg: 1.5 CBGS: 95-114.   Diet Order:  Fall precautions Diet NPO time specified  EDUCATION NEEDS:   Education needs have been addressed  Skin:  Skin Assessment: Skin Integrity Issues: Skin Integrity Issues:: Other (Comment) Other: skin tear rt and lt elbows  Last BM:  04/16/17  Height:   Ht Readings from Last 1 Encounters:  04/07/17 '6\' 1"'  (1.854 m)    Weight:   Wt Readings from Last 1 Encounters:  04/17/17 165 lb 11.2 oz (75.2 kg)    Ideal Body Weight:  83.6 kg  BMI:  Body mass index is 21.86 kg/m.  Estimated Nutritional Needs:   Kcal:  1850-2050  Protein:  90-105 grams  Fluid:  1.8-2.0 L    [name], LDN, CDE Pager: [phone] hours Pager: [phone]

## 2017-04-17 NOTE — Progress Notes (Signed)
Pt refused pink foam for sacrum. RN educated. Pt still refused.

## 2017-04-17 NOTE — Progress Notes (Addendum)
PROGRESS NOTE    [name]  DPO:[id number] DOB: 06/14/1932 DOA: 04/07/2017 PCP: [name], MD     Brief Narrative:  Joshua Grinder Ludwickis a 84 y.o.malewith medical history significant ofafibnot on anticoagulation due to prior subdural hemorrhage,PE,hypertension, hyperlipidemia, diet-controlled diabetes, asthma, GERD, SDH, CAD,s/p ofCABGandstent placement,sCHFwith EF 35%, AICD placement, who presents with cough, chest pain and fever of 102.1. He was found to have right lower lobe infiltrates. He was admitted for HCAP complicated by influenza A infection.  He has been treated with IV antibiotics and Tamiflu.  Throughout hospitalization, he has been intermittently confused.  Swallow evaluation revealed severe dysphagia.  He was kept n.p.o.  Eventually, cortrack was placed on 3/14 for nutrition.  Palliative care consulted for goals of care.  Assessment & Plan:   Principal Problem:   Influenza A Active Problems:   Chronic atrial fibrillation (HCC)   Chronic systolic heart failure- EF 35-40%   CAD (coronary artery disease)   HCAP (healthcare-associated pneumonia)   Thrombocytopenia (HCC)   ICD (implantable cardioverter-defibrillator) in place   Sepsis ([place])   CKD (chronic kidney disease), stage III ([place])   Acute respiratory failure with hypoxia (HCC)   Malnutrition of moderate degree   Goals of care, counseling/discussion   Palliative care by specialist   Acute hypoxic respiratory failure 2/2 to HCAP, Influenza A, and acute on chronic aspiration -Completed 5 days of IV cefepime and Tamiflu -Now on room air   Acute metabolic encephalopathy most likely multifactorial, resolved -Suspect underlying dementia plus infective process with HCAP/influenza A -Stable  Acute on chronic severe dysphagia -Modified barium swallow evaluation 04/10/2017 -N.p.o. recommended by speech therapy 04/11/2017 -Started cortrack 04/13/2017 for medication, nutrition and hydration -Dietary  consult for tube feeding recommendations -?PEG. Palliative care is involved goals of care and discussion with patient and son   Chronic A Fib -Not on anticoagulation due to hx GI bleed -Continue digoxin and metoprolol  -Rate controlled  Polyneuropathy -Continue gabapentin  Chronic systolic HFREF 43% post AICD -Continue lopressor  -Monitor urine output, I&Os, daily weight  Hypomagnesemia -Replace, trend   Goals of care -Spoke with son over the phone 3/17 afternoon. I discussed with him patient's medical condition, hospitalization course, severe dysphagia, NPO status, cortrak, and decision regarding PEG tube. Patient basically has an option of PEG tube placement which would lead to poor quality of life as he would be kept NPO to decrease (but not eliminate) aspiration risk. The other option is for comfort care, comfort feeding, knowing and accepting risk of aspiration and shortened life span, but with better quality of life. All questions were answered. Son wants to think about the decision, speak to patient, and will let us know in the next 1-2 days regarding decision.  -Spoke with patient today. He has had inconsistencies in his decision for PEG. Couple days ago, he stated he will do whatever he needs to do to leave the hospital. Yesterday, he stated he didn't know anything about it and asked me for details of PEG (which had already been discussed previously). Today, he states that he felt like he had no other choice and asked me when the procedure will be done. He really does not seem to understand the gravity of his diagnosis and prognosis as well as quality of life. I offered him choice of hospice and comfort feeding but he stated he needs more information on hospice.    DVT prophylaxis: SCD Code Status: Full Family Communication: No family at bedside, spoke with son  over the phone 3/17  Disposition Plan: Pending further goals of care discussion.  Patient has severe dysphagia, is  currently getting nutrition via cortrack.  I suspect he is a very poor candidate for PEG tube. Palliative care has been consulted. At the very least, he will need SNF on discharge, but may be hospice appropriate    Consultants:   none  Procedures:   none  Antimicrobials:  Anti-infectives (From admission, onward)   Start     Dose/Rate Route Frequency Ordered Stop   04/10/17 1300  oseltamivir (TAMIFLU) capsule 75 mg  Status:  Discontinued     75 mg Oral 2 times daily 04/10/17 1257 04/11/17 1247   04/08/17 2200  ceFEPIme (MAXIPIME) 1 g in sodium chloride 0.9 % 100 mL IVPB  Status:  Discontinued     1 g 200 mL/hr over 30 Minutes Intravenous Every 12 hours 04/08/17 1212 04/11/17 1247   04/08/17 0000  ceFEPIme (MAXIPIME) 1 g in sodium chloride 0.9 % 100 mL IVPB  Status:  Discontinued     1 g 200 mL/hr over 30 Minutes Intravenous Every 24 hours 04/07/17 0615 04/08/17 1212   04/07/17 1600  vancomycin (VANCOCIN) IVPB 750 mg/150 ml premix  Status:  Discontinued     750 mg 150 mL/hr over 60 Minutes Intravenous Every 12 hours 04/07/17 0638 04/08/17 1657   04/07/17 1000  oseltamivir (TAMIFLU) capsule 75 mg  Status:  Discontinued     75 mg Oral 2 times daily 04/07/17 0602 04/07/17 0636   04/07/17 1000  oseltamivir (TAMIFLU) capsule 30 mg  Status:  Discontinued     30 mg Oral 2 times daily 04/07/17 0636 04/10/17 1257   04/07/17 0300  vancomycin (VANCOCIN) 1,500 mg in sodium chloride 0.9 % 500 mL IVPB     1,500 mg 250 mL/hr over 120 Minutes Intravenous  Once 04/07/17 0220 04/07/17 0524   04/07/17 0230  ceFEPIme (MAXIPIME) 2 g in sodium chloride 0.9 % 100 mL IVPB     2 g 200 mL/hr over 30 Minutes Intravenous  Once 04/07/17 0218 04/07/17 0314   04/07/17 0230  vancomycin (VANCOCIN) IVPB 1000 mg/200 mL premix  Status:  Discontinued     1,000 mg 200 mL/hr over 60 Minutes Intravenous  Once 04/07/17 0218 04/07/17 0220       Subjective: No new issues   Objective: Vitals:   04/16/17 1117  04/16/17 2020 04/17/17 0355 04/17/17 0833  BP: 140/85 137/72 130/75 (!) 115/58  Pulse:  (!) 105 93 88  Resp:  20 20   Temp: 97.8 F (36.6 C) 97.8 F (36.6 C) 98.1 F (36.7 C)   TempSrc: Oral Oral Oral   SpO2: 94% 98% 95%   Weight:   75.2 kg (165 lb 11.2 oz)   Height:        Intake/Output Summary (Last 24 hours) at 04/17/2017 1115 Last data filed at 04/17/2017 1000 Gross per 24 hour  Intake 50 ml  Output 200 ml  Net -150 ml   Filed Weights   04/15/17 0501 04/16/17 0406 04/17/17 0355  Weight: 73.5 kg (162 lb 1.6 oz) 71.2 kg (157 lb) 75.2 kg (165 lb 11.2 oz)    Examination: General exam: Appears calm and comfortable  Respiratory system: Clear to auscultation. Respiratory effort normal. Cardiovascular system: S1 & S2 heard, RRR. No JVD, murmurs, rubs, gallops or clicks. No pedal edema. Gastrointestinal system: Abdomen is nondistended, soft and nontender. No organomegaly or masses felt. Normal bowel sounds heard. +Cortrak  Central nervous  system: Alert. No focal neurological deficits. Extremities: Symmetric 5 x 5 power. Skin: No rashes, lesions or ulcers Psychiatry: Judgement and insight appear poor     Data Reviewed: I have personally reviewed following labs and imaging studies  CBC: Recent Labs  Lab 04/13/17 1634 04/16/17 0721  WBC 9.4 6.5  HGB 13.8 12.2*  HCT 42.6 38.5*  MCV 94.0 95.3  PLT 217 950   Basic Metabolic Panel: Recent Labs  Lab 04/13/17 1634 04/14/17 0724 04/15/17 1344 04/16/17 0721 04/17/17 0633  NA 144 144 142 143 139  K 3.3* 3.1* 3.8 4.0 4.4  CL 107 110 109 111 105  CO2 22 21* 24 25 23   GLUCOSE 115* 158* 138* 115* 132*  BUN 11 12 11 11 12   CREATININE 1.04 0.94 0.88 0.78 0.84  CALCIUM 8.7* 8.4* 8.0* 8.0* 8.2*  MG  --  1.5* 1.9 1.7 1.5*   GFR: Estimated Creatinine Clearance: 72.1 mL/min (by C-G formula based on SCr of 0.84 mg/dL). Liver Function Tests: No results for input(s): AST, ALT, ALKPHOS, BILITOT, PROT, ALBUMIN in the last 168  hours. No results for input(s): LIPASE, AMYLASE in the last 168 hours. No results for input(s): AMMONIA in the last 168 hours. Coagulation Profile: No results for input(s): INR, PROTIME in the last 168 hours. Cardiac Enzymes: No results for input(s): CKTOTAL, CKMB, CKMBINDEX, TROPONINI in the last 168 hours. BNP (last 3 results) No results for input(s): PROBNP in the last 8760 hours. HbA1C: No results for input(s): HGBA1C in the last 72 hours. CBG: Recent Labs  Lab 04/16/17 1213 04/16/17 1713 04/16/17 2046 04/17/17 0025 04/17/17 0439  GLUCAP 142* 122* 111* 114* 95   Lipid Profile: No results for input(s): CHOL, HDL, LDLCALC, TRIG, CHOLHDL, LDLDIRECT in the last 72 hours. Thyroid Function Tests: No results for input(s): TSH, T4TOTAL, FREET4, T3FREE, THYROIDAB in the last 72 hours. Anemia Panel: No results for input(s): VITAMINB12, FOLATE, FERRITIN, TIBC, IRON, RETICCTPCT in the last 72 hours. Sepsis Labs: No results for input(s): PROCALCITON, LATICACIDVEN in the last 168 hours.  Recent Results (from the past 240 hour(s))  Culture, Urine     Status: Abnormal   Collection Time: 04/07/17  6:06 PM  Result Value Ref Range Status   Specimen Description URINE, RANDOM  Final   Special Requests NONE  Final   Culture (A)  Final    <10,000 COLONIES/mL Performed at [facility], [address] [place], [place] [address]    Report Status 04/09/2017 FINAL  Final  Culture, sputum-assessment     Status: None   Collection Time: 04/08/17 12:51 PM  Result Value Ref Range Status   Specimen Description SPUTUM  Final   Special Requests NONE  Final   Sputum evaluation   Final    Sputum specimen not acceptable for testing.  Please recollect.   Gram Stain Report Called to,Read Back By and Verified With: R ARYAL,RN AT 1507 04/08/17 BY L BENFIELD Performed at [facility], [place] [address] [place], [place] [address]    Report Status 04/08/2017 FINAL  Final       Radiology  Studies: No results found.    Scheduled Meds: . digoxin  0.125 mg Oral Daily  . docusate  50 mg Per Tube BID  . feeding supplement (PRO-STAT SUGAR FREE 64)  30 mL Per Tube Daily  . free water  150 mL Per Tube Q4H  . guaiFENesin-dextromethorphan  15 mL Per Tube TID AC & HS  . metoprolol tartrate  12.5 mg Per Tube BID  . sennosides  5 mL Per Tube BID   Continuous Infusions: . feeding supplement (JEVITY 1.5 CAL/FIBER) 1,000 mL (04/17/17 0454)     LOS: 10 days    Time spent: 20 minutes   Dessa Phi, DO [facility] [email or web address] Password [facility] 04/17/2017, 11:15 AM

## 2017-04-18 DIAGNOSIS — Z66 Do not resuscitate: Secondary | ICD-10-CM

## 2017-04-18 DIAGNOSIS — Z748 Other problems related to care provider dependency: Secondary | ICD-10-CM

## 2017-04-18 DIAGNOSIS — J1000 Influenza due to other identified influenza virus with unspecified type of pneumonia: Secondary | ICD-10-CM

## 2017-04-18 LAB — BASIC METABOLIC PANEL
Anion gap: 9 (ref 5–15)
BUN: 12 mg/dL (ref 6–20)
CALCIUM: 8.3 mg/dL — AB (ref 8.9–10.3)
CO2: 26 mmol/L (ref 22–32)
Chloride: 100 mmol/L — ABNORMAL LOW (ref 101–111)
Creatinine, Ser: 0.81 mg/dL (ref 0.61–1.24)
GFR calc Af Amer: >60 mL/min (ref >60)
GFR calc non Af Amer: >60 mL/min (ref >60)
GLUCOSE: 125 mg/dL — AB (ref 65–99)
POTASSIUM: 4.3 mmol/L (ref 3.5–5.1)
Sodium: 135 mmol/L (ref 135–145)

## 2017-04-18 LAB — GLUCOSE, CAPILLARY
GLUCOSE-CAPILLARY: 121 mg/dL — AB (ref 65–99)
GLUCOSE-CAPILLARY: 136 mg/dL — AB (ref 65–99)
GLUCOSE-CAPILLARY: 141 mg/dL — AB (ref 65–99)
GLUCOSE-CAPILLARY: 142 mg/dL — AB (ref 65–99)
Glucose-Capillary: 105 mg/dL — ABNORMAL HIGH (ref 65–99)
Glucose-Capillary: 127 mg/dL — ABNORMAL HIGH (ref 65–99)

## 2017-04-18 LAB — MAGNESIUM: Magnesium: 1.7 mg/dL (ref 1.7–2.4)

## 2017-04-18 LAB — TROPONIN I
Troponin I: <0.03 ng/mL (ref <0.03)
Troponin I: <0.03 ng/mL (ref <0.03)

## 2017-04-18 LAB — PROTIME-INR
INR: 1.11
PROTHROMBIN TIME: 14.2 s (ref 11.4–15.2)

## 2017-04-18 MED ORDER — MENTHOL 3 MG MT LOZG
1.0000 | LOZENGE | OROMUCOSAL | Status: DC | PRN
  Filled 2017-04-18: qty 9

## 2017-04-18 NOTE — Progress Notes (Signed)
Pt needs MD signature on DNR form. RN notified MD.

## 2017-04-18 NOTE — Progress Notes (Signed)
  Speech Language Pathology Treatment: Dysphagia  Patient Details Name: [name] MRN: [id number] DOB: 1932-09-02 Today's Date: 04/18/2017 Time: 4098-1191 SLP Time Calculation (min) (ACUTE ONLY): 25 min  Assessment / Plan / Recommendation Clinical Impression  Pt presents with no changes in swallowing function.  Pt had immediate expectoration of boluses in 100% of trials followed by delayed coughing which remains concerning for pharyngeal residue.  Pt states boluses "just won't go down."  Pt declined further trials stating he was worried he was going to be sick.  Attempted to have a discussion with pt educating him on the difference of PEG versus comfort feedings but pt does not appear to have any carryover of this information from therapist's or medical team's previous conversations.  Pt even asked for ice water as therapist was ending session and appeared to have forgotten declining earlier trials.  Discharge to SNF or Hospice remains appropriate at this time.  Awaiting family input for further decision making.    HPI HPI: Joshua Spirito Ludwickis a 84 y.o.malewith medical history significant ofafibnot on anticoagulation due to prior subdural hemorrhage,PE,hypertension, hyperlipidemia, diet-controlled diabetes, asthma, GERD, SDH, CAD,s/p ofCABGandstent placement,sCHFwith EF 35%, AICD placement, who presents with cough, chest pain and fever. T-max 102.1. Right lower lobe infiltrates. Admitted for HCAP complicated by influenza A infection. Pt has a history of mild oropharyngeal and cervical esophageal dysphagia per MBS 02/07/14; pharyngeal residuals noted across consistencies that liquid swallow help to clear but with resultant trace aspiration of liquids (thin and nectar). Regular diet with thin liquids was recommended as pt's weight was stable and no PNA since 2013. MBS recommended to fully evaluate oropharyngeal swallow.       SLP Plan  Continue with current plan of care        Recommendations  Diet recommendations: NPO Medication Administration: Via alternative means                Oral Care Recommendations: Oral care QID Follow up Recommendations: Skilled Nursing facility SLP Visit Diagnosis: Dysphagia, oropharyngeal phase (R13.12) Plan: Continue with current plan of care       GO                PageSelinda Orion 04/18/2017, 2:24 PM

## 2017-04-18 NOTE — Clinical Social Work Placement (Signed)
   CLINICAL SOCIAL WORK PLACEMENT  NOTE  Date:  04/18/2017  Patient Details  Name: [name] MRN: [id number] Date of Birth: 05/12/1932  Clinical Social Work is seeking post-discharge placement for this patient at the [facility] level of care (*CSW will initial, date and re-position this form in  chart as items are completed):  Yes   Patient/family provided with [place] Work Department's list of facilities offering this level of care within the geographic area requested by the patient (or if unable, by the patient's family).  Yes   Patient/family informed of their freedom to choose among providers that offer the needed level of care, that participate in Medicare, Medicaid or managed care program needed by the patient, have an available bed and are willing to accept the patient.  Yes   Patient/family informed of [facility]'s ownership interest in [facility] and [facility], as well as of the fact that they are under no obligation to receive care at these facilities.  PASRR submitted to EDS on 04/18/17     PASRR number received on       Existing PASRR number confirmed on 04/18/17     FL2 transmitted to all facilities in geographic area requested by pt/family on 04/18/17     FL2 transmitted to all facilities within larger geographic area on       Patient informed that his/her managed care company has contracts with or will negotiate with certain facilities, including the following:            Patient/family informed of bed offers received.  Patient chooses bed at       Physician recommends and patient chooses bed at      Patient to be transferred to   on  .  Patient to be transferred to facility by       Patient family notified on   of transfer.  Name of family member notified:        PHYSICIAN Please sign FL2     Additional Comment:    _______________________________________________ Candie Chroman, LCSW 04/18/2017,  4:32 PM

## 2017-04-18 NOTE — Consult Note (Signed)
[facility] Consult   Reason for Consult:  Capacity evaluation  Referring Physician:  Dr. Maylene Roes Patient Identification: [name] MRN:  [id number] Principal Diagnosis: Evaluation by psychiatric service required Diagnosis:   Patient Active Problem List   Diagnosis Date Noted  . Goals of care, counseling/discussion [Z71.89]   . Palliative care by specialist [Z51.5]   . Malnutrition of moderate degree [E44.0] 04/13/2017  . Sepsis ([place]) [A41.9] 04/07/2017  . Influenza A [J10.1] 04/07/2017  . CKD (chronic kidney disease), stage III ([place]) [N18.3] 04/07/2017  . Acute respiratory failure with hypoxia ([place]) [J96.01] 04/07/2017  . Syncope [R55] 04/01/2017  . Acute kidney injury ([place]) [N17.9] 04/01/2017  . Thyroid nodule [E04.1] 04/01/2017  . Chronic pulmonary embolism ([place]) [I27.82] 03/27/2017  . Acute confusion [R41.0] 03/04/2017  . Failure of implantable cardioverter-defibrillator (ICD) lead [T82.110A] 03/02/2017  . Rectal ulcer [K62.6]   . Rectal bleeding [K62.5]   . Proctitis [K62.89] 02/07/2017  . Abdominal pain [R10.9] 02/06/2017  . Acute radiation proctitis [K62.89, T66.XXXA] 02/06/2017  . Failure of implantable cardioverter-defibrillator lead [T82.110A] 11/10/2016  . ICD (implantable cardioverter-defibrillator) in place [Z95.810] 04/08/2016  . Gastroesophageal reflux disease without esophagitis [K21.9] 12/31/2013  . Hypotension [I95.9] 09/23/2011  . HCAP (healthcare-associated pneumonia) [J18.9] 09/23/2011  . SIRS (systemic inflammatory response syndrome) ([place]) [R65.10] 09/23/2011  . Thrombocytopenia ([place]) [D69.6] 09/23/2011  . History of subdural hemorrhage [Z86.79] 09/08/2011  . Ataxia [R27.0] 08/17/2011  . Dehydration [E86.0] 08/17/2011  . Atrial fibrillation with controlled ventricular response ([place]) [I48.91] 08/13/2011  . Colon polyp [K63.5] 07/07/2011  . Angiodysplasia of colon [K55.20] 07/07/2011  . GERD (gastroesophageal reflux disease) [K21.9]  05/31/2011  . Anemia, iron deficiency [D50.9] 12/29/2010  . Chronic anticoagulation discontinued July 2013 after developed subdural hematoma [Z79.01] 09/22/2010  . Bradycardia [R00.1] 09/22/2010  . CAD (coronary artery disease) [I25.10] 06/25/2010  . ventricular tachycardia [I47.2] 12/30/2009  . Chronic atrial fibrillation ([place]) [I48.2] 12/30/2009  . Chronic systolic heart failure- EF 35-40% [I50.22] 12/30/2009  . Automatic implantable cardioverter-defibrillator in situ [Z95.810] 12/30/2009    Total Time spent with patient: 1 hour  Subjective:   [name] is a 84 y.o. male patient admitted with acute hypoxic respiratory failure secondary to HCAP, influenza A and acute on chronic aspiration.  HPI:   Per chart review, patient has acute on chronic severe dysphagia and is currently NPO. He has been seen by palliative care to discuss goals are care. He has not been able to sustain a choice regarding PEG placement versus hospice and comfort feeding. He does not seem to grap a good understand of PEG, post-PEG care, quality of life, NPO status indefinitely due to severe dysphagia and aspiration risk. Psychiatry was consulted to evaluate for capacity to make this decision.   Of note, patient was last seen on 2/27 for capacity evaluation to refuse SNF placement. He did not demonstrate capacity at that time to make this decision. He was [facility] with [facility].  On interview, [name] is unable to explain the purpose of a PEG tube. He does not understand the purpose of hospice and comfort feeding. He was educated about the purpose of both and he was unable to repeat the information back to the notewriter. He is able to state that he wants a PEG placed but he is unable to explain why. He reports no mood problems. He reports sleeping well. He denies SI, HI or AVH. He is oriented to self and place. He believes that the date is 03/[birth year]/1997  and that he is 84 y/o.     Past Psychiatric  History: Denies   Risk to Self: Is patient at risk for suicide?: No Risk to Others:  None. Denies HI.  Prior Inpatient Therapy:  Denies  Prior Outpatient Therapy:  Denies   Past Medical History:  Past Medical History:  Diagnosis Date  . Abnormal thyroid scan    Abnormal thyroid imaging studies from 11/09/2010, status post ultrasound guided fine needle aspiration of the dominant left inferior thyroid nodule on 12/15/2010. Cytology report showed rare follicular epithelial cells and hemosiderin laden macrophages.  . Adenomatous colon polyp   . AICD (automatic cardioverter/defibrillator) present    a. fx lead; a. s/p lead extraction 03/02/17  . Arthritis    "all over"  . Asthma   . Atrial fibrillation ([place])    on chronic Coumadin; stopped July 2013 due to subdural hematomas  . CHF (congestive heart failure) (HCC)    EF 35-40% s/p most recent ICD generator change-out with Medtronic dual-chamber ICD 05/20/11 with explantation of previous abdominally-implanted device  . Coronary artery disease    s/p CABG 1983 and PCI/stent 2004.   . Diabetes mellitus    diet controlled  . Diverticulosis   . Dyslipidemia   . Enteritis   . Erythrocytosis   . GERD (gastroesophageal reflux disease)   . Hepatic steatosis   . Hypertension   . Ischemic cardiomyopathy    WITH CHF  . Monocytosis 04/17/2013  . Myocardial infarction ([place]) 1983; ~ 1990  . Pleural effusion    right  . Pneumonia August 2013  . Portal hypertensive gastropathy ([place])   . Rectal ulcer   . Renal calculi   . Subdural hematoma [facility]) July 2013   Anticoagulation stopped.   . SunDown syndrome   . VT (ventricular tachycardia) ([place])     Past Surgical History:  Procedure Laterality Date  . CHOLECYSTECTOMY    . COLONOSCOPY  07/07/2011   Procedure: COLONOSCOPY;  Surgeon: Jerene Bears, MD;  Location: [facility];  Service: Gastroenterology;  Laterality: N/A;  . CORONARY ANGIOPLASTY WITH STENT PLACEMENT  2004   Tandem Cypher stents LAD   . CORONARY ARTERY BYPASS GRAFT  1983   SVG-mLAD  . ESOPHAGOGASTRODUODENOSCOPY  02/11/2011   Procedure: ESOPHAGOGASTRODUODENOSCOPY (EGD);  Surgeon: Beryle Beams, MD;  Location: [facility];  Service: Endoscopy;  Laterality: N/A;  . FLEXIBLE SIGMOIDOSCOPY N/A 02/08/2017   Procedure: FLEXIBLE SIGMOIDOSCOPY;  Surgeon: Irene Shipper, MD;  Location: [facility];  Service: Endoscopy;  Laterality: N/A;  . ICD GENERATOR CHANGEOUT N/A 04/08/2016   Procedure: ICD Generator Changeout;  Surgeon: Evans Lance, MD;  Location: [facility];  Service: Cardiovascular;  Laterality: N/A;  . ICD LEAD REMOVAL N/A 03/02/2017   Procedure: ICD LEAD REMOVAL;  Surgeon: Evans Lance, MD;  Location: [facility];  Service: Cardiovascular;  Laterality: N/A;  . IMPLANTABLE CARDIOVERTER DEFIBRILLATOR (ICD) GENERATOR CHANGE N/A 05/20/2011   Procedure: ICD GENERATOR CHANGE;  Surgeon: Evans Lance, MD;  Medtronic secure dual-chamber ICD serial number [id number]   . KNEE ARTHROSCOPY     right; "just went in and scraped it"  . LEAD INSERTION N/A 03/02/2017   Procedure: LEAD INSERTION;  Surgeon: Evans Lance, MD;  Location: [facility];  Service: Cardiovascular;  Laterality: N/A;  . MASS EXCISION Right 05/10/2013   Procedure: EXCISION MASS RIGHT THUMB;  Surgeon: Wynonia Sours, MD;  Location: [facility];  Service: Orthopedics;  Laterality: Right;  . PROXIMAL INTERPHALANGEAL  FUSION (PIP) Right 05/10/2013   Procedure: DEBRIDEMENT PROXIMAL INTERPHALANGEAL FUSION (PIP);  Surgeon: Wynonia Sours, MD;  Location: [facility];  Service: Orthopedics;  Laterality: Right;  . TONSILLECTOMY         Family History:  Family History  Problem Relation Age of Onset  . Tuberculosis Mother   . Tuberculosis Father   . Heart disease Brother   . Diabetes Sister   . Diabetes Brother   . Clotting disorder Brother    Family Psychiatric  History: Denies  Social History:  Social History   Substance and Sexual Activity   Alcohol Use No     Social History   Substance and Sexual Activity  Drug Use No    Social History   Socioeconomic History  . Marital status: Divorced    Spouse name: None  . Number of children: 2  . Years of education: None  . Highest education level: None  Social Needs  . Financial resource strain: None  . Food insecurity - worry: None  . Food insecurity - inability: None  . Transportation needs - medical: None  . Transportation needs - non-medical: None  Occupational History  . Occupation: Sports coach: RETIRED  Tobacco Use  . Smoking status: Former Smoker    Packs/day: 2.00    Years: 30.00    Pack years: 60.00    Types: Cigarettes    Last attempt to quit: 06/23/1976    Years since quitting: 40.8  . Smokeless tobacco: Never Used  Substance and Sexual Activity  . Alcohol use: No  . Drug use: No  . Sexual activity: No  Other Topics Concern  . None  Social History Narrative  . None   Additional Social History: He lives at home alone. He has [facility]. He is divorced. He has a daughter who lives on the [place] and a son who lives in [place]. 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 He denies alcohol or illicit substance use.     Allergies:   Allergies  Allergen Reactions  . Tamsulosin Other (See Comments)    Dizziness, Made BP very low and weakness  . Celebrex [Celecoxib] Hives and Nausea And Vomiting    Gi upset  . Dronedarone Nausea And Vomiting and Other (See Comments)    GI upset, abdominal pain  . Esomeprazole Magnesium Hives and Other (See Comments)    "don't really remember"  . Digoxin Diarrhea    May have caused some diarrhea  . Hydrocodone-Acetaminophen Other (See Comments)    Bad headache  . Protonix [Pantoprazole Sodium] Nausea And Vomiting and Other (See Comments)    Tolerates Dexilant    Labs:  Results for orders placed or performed during the hospital encounter of 04/07/17 (from the past 48  hour(s))  Glucose, capillary     Status: Abnormal   Collection Time: 04/16/17  5:13 PM  Result Value Ref Range   Glucose-Capillary 122 (H) 65 - 99 mg/dL   Comment 1 Notify RN    Comment 2 Document in Chart   Glucose, capillary     Status: Abnormal   Collection Time: 04/16/17  8:46 PM  Result Value Ref Range   Glucose-Capillary 111 (H) 65 - 99 mg/dL   Comment 1 Notify RN    Comment 2 Document in Chart   Glucose, capillary     Status: Abnormal   Collection Time: 04/17/17 12:25 AM  Result Value Ref Range  Glucose-Capillary 114 (H) 65 - 99 mg/dL   Comment 1 Notify RN    Comment 2 Document in Chart   Glucose, capillary     Status: None   Collection Time: 04/17/17  4:39 AM  Result Value Ref Range   Glucose-Capillary 95 65 - 99 mg/dL  Basic metabolic panel     Status: Abnormal   Collection Time: 04/17/17  6:33 AM  Result Value Ref Range   Sodium 139 135 - 145 mmol/L   Potassium 4.4 3.5 - 5.1 mmol/L   Chloride 105 101 - 111 mmol/L   CO2 23 22 - 32 mmol/L   Glucose, Bld 132 (H) 65 - 99 mg/dL   BUN 12 6 - 20 mg/dL   Creatinine, Ser 0.84 0.61 - 1.24 mg/dL   Calcium 8.2 (L) 8.9 - 10.3 mg/dL   GFR calc non Af Amer >60 >60 mL/min   GFR calc Af Amer >60 >60 mL/min    Comment: (NOTE) The eGFR has been calculated using the CKD EPI equation. This calculation has not been validated in all clinical situations. eGFR's persistently <60 mL/min signify possible Chronic Kidney Disease.    Anion gap 11 5 - 15    Comment: Performed at [facility] [address] [place], [place] [address]  Magnesium     Status: Abnormal   Collection Time: 04/17/17  6:33 AM  Result Value Ref Range   Magnesium 1.5 (L) 1.7 - 2.4 mg/dL    Comment: Performed at [facility] [address], [place], [place] [address]  Glucose, capillary     Status: Abnormal   Collection Time: 04/17/17 11:22 AM  Result Value Ref Range   Glucose-Capillary 102 (H) 65 - 99 mg/dL  Glucose, capillary     Status:  Abnormal   Collection Time: 04/17/17  3:19 PM  Result Value Ref Range   Glucose-Capillary 126 (H) 65 - 99 mg/dL   Comment 1 Notify RN   Troponin I (q 6hr x 3)     Status: None   Collection Time: 04/17/17  8:35 PM  Result Value Ref Range   Troponin I <0.03 <0.03 ng/mL    Comment: Performed at [facility], [place] [address], [place], [place] [address]  Glucose, capillary     Status: Abnormal   Collection Time: 04/17/17  9:44 PM  Result Value Ref Range   Glucose-Capillary 141 (H) 65 - 99 mg/dL   Comment 1 Notify RN    Comment 2 Document in Chart   Glucose, capillary     Status: Abnormal   Collection Time: 04/18/17 12:09 AM  Result Value Ref Range   Glucose-Capillary 142 (H) 65 - 99 mg/dL   Comment 1 Notify RN    Comment 2 Document in Chart   Basic metabolic panel     Status: Abnormal   Collection Time: 04/18/17  2:06 AM  Result Value Ref Range   Sodium 135 135 - 145 mmol/L   Potassium 4.3 3.5 - 5.1 mmol/L   Chloride 100 (L) 101 - 111 mmol/L   CO2 26 22 - 32 mmol/L   Glucose, Bld 125 (H) 65 - 99 mg/dL   BUN 12 6 - 20 mg/dL   Creatinine, Ser 0.81 0.61 - 1.24 mg/dL   Calcium 8.3 (L) 8.9 - 10.3 mg/dL   GFR calc non Af Amer >60 >60 mL/min   GFR calc Af Amer >60 >60 mL/min    Comment: (NOTE) The eGFR has been calculated  using the CKD EPI equation. This calculation has not been validated in all clinical situations. eGFR's persistently <60 mL/min signify possible Chronic Kidney Disease.    Anion gap 9 5 - 15    Comment: Performed at [facility] [address], [place], [place] [address]  Magnesium     Status: None   Collection Time: 04/18/17  2:06 AM  Result Value Ref Range   Magnesium 1.7 1.7 - 2.4 mg/dL    Comment: Performed at [facility] [address], [place], [place] [address]  Troponin I (q 6hr x 3)     Status: None   Collection Time: 04/18/17  2:06 AM  Result Value Ref Range   Troponin I <0.03 <0.03 ng/mL    Comment: Performed at [facility] [address] [place], [place] [address]  Glucose, capillary     Status: Abnormal   Collection Time: 04/18/17  4:08 AM  Result Value Ref Range   Glucose-Capillary 105 (H) 65 - 99 mg/dL   Comment 1 Notify RN    Comment 2 Document in Chart   Troponin I (q 6hr x 3)     Status: None   Collection Time: 04/18/17  7:44 AM  Result Value Ref Range   Troponin I <0.03 <0.03 ng/mL    Comment: Performed at [facility], [place] [address], [place], [place] [address]  Glucose, capillary     Status: Abnormal   Collection Time: 04/18/17  8:22 AM  Result Value Ref Range   Glucose-Capillary 136 (H) 65 - 99 mg/dL  Glucose, capillary     Status: Abnormal   Collection Time: 04/18/17 11:42 AM  Result Value Ref Range   Glucose-Capillary 121 (H) 65 - 99 mg/dL    Current Facility-Administered Medications  Medication Dose Route Frequency Provider Last Rate Last Dose  . acetaminophen (TYLENOL) suppository 650 mg  650 mg Rectal Q6H PRN Gardiner Barefoot, NP      . calcium carbonate (TUMS - dosed in mg elemental calcium) chewable tablet 500 mg  500 mg Oral Daily PRN Ivor Costa, MD      . digoxin Fonnie Birkenhead) tablet 0.125 mg  0.125 mg Oral Daily Ivor Costa, MD   0.125 mg at 04/18/17 0810  . docusate (COLACE) 50 MG/5ML liquid 50 mg  50 mg Per Tube BID Irene Pap N, DO   50 mg at 04/17/17 2324  . feeding supplement (JEVITY 1.5 CAL/FIBER) liquid 1,000 mL  1,000 mL Per Tube Continuous Irene Pap N, DO 50 mL/hr at 04/18/17 0253 1,000 mL at 04/18/17 0253  . feeding supplement (PRO-STAT SUGAR FREE 64) liquid 30 mL  30 mL Per Tube Daily Irene Pap N, DO   30 mL at 04/18/17 0810  . fluticasone (FLONASE) 50 MCG/ACT nasal spray 1 spray  1 spray Each Nare Daily PRN Ivor Costa, MD      . free water 150 mL  150 mL Per Tube Q4H Hall, Carole N, DO   150 mL at 04/18/17 1134  . gabapentin (NEURONTIN) capsule 100-300 mg  100-300 mg Oral QHS PRN Ivor Costa, MD      . guaiFENesin-dextromethorphan [facility] DM)  100-10 MG/5ML syrup 15 mL  15 mL Per Tube TID AC & HS Dessa Phi, DO   15 mL at 04/18/17 0911  . ipratropium-albuterol (DUONEB) 0.5-2.5 (3) MG/3ML nebulizer solution 3 mL  3 mL Nebulization Q4H PRN Irene Pap N, DO      . loratadine (  CLARITIN) tablet 10 mg  10 mg Oral Daily PRN Ivor Costa, MD      . LORazepam (ATIVAN) injection 0.5 mg  0.5 mg Intravenous Daily PRN Bodenheimer, Charles A, NP   0.5 mg at 04/18/17 0031  . menthol-cetylpyridinium (CEPACOL) lozenge 3 mg  1 lozenge Oral PRN Bodenheimer, Charles A, NP      . metoprolol tartrate (LOPRESSOR) 25 mg/10 mL oral suspension 12.5 mg  12.5 mg Per Tube BID Dessa Phi, DO   12.5 mg at 04/18/17 0810  . morphine 2 MG/ML injection 1 mg  1 mg Intravenous Q3H PRN Gardiner Barefoot, NP   1 mg at 04/11/17 0031  . morphine 2 MG/ML injection 2 mg  2 mg Intravenous Once Bodenheimer, Charles A, NP      . nitroGLYCERIN (NITROSTAT) SL tablet 0.4 mg  0.4 mg Sublingual Q5 min PRN Ivor Costa, MD   0.4 mg at 04/17/17 2043  . phenol (CHLORASEPTIC) mouth spray 1 spray  1 spray Mouth/Throat PRN Ivor Costa, MD   1 spray at 04/18/17 0125  . sennosides (SENOKOT) 8.8 MG/5ML syrup 5 mL  5 mL Per Tube BID Irene Pap N, DO   5 mL at 04/17/17 2325    Musculoskeletal: Strength & Muscle Tone: decreased due to physical deconditioning.  Gait & Station: UTA due to patient lying in bed. Patient leans: N/A  Psychiatric Specialty Exam: Physical Exam  Nursing note and vitals reviewed. Constitutional: He appears well-developed and well-nourished.  HENT:  Head: Normocephalic and atraumatic.  Neck: Normal range of motion.  Respiratory: Effort normal.  Musculoskeletal: Normal range of motion.  Neurological: He is alert.  Oriented to person and place.  Psychiatric: He has a normal mood and affect. His speech is normal and behavior is normal. Judgment and thought content normal. Cognition and memory are impaired.    Review of Systems  Constitutional: Negative  for chills and fever.  Cardiovascular: Negative for chest pain.  Gastrointestinal: Negative for abdominal pain, constipation, diarrhea, nausea and vomiting.  Psychiatric/Behavioral: Negative for depression, hallucinations, substance abuse and suicidal ideas. The patient is not nervous/anxious and does not have insomnia.   All other systems reviewed and are negative.   Blood pressure 134/79, pulse (!) 106, temperature 97.6 F (36.4 C), temperature source Oral, resp. rate 18, height _0  (1.854 m), weight 75.2 kg (165 lb 11.2 oz), SpO2 95 %.Body mass index is 21.86 kg/m.  General Appearance: Fairly Groomed, elderly, tall, Caucasian male with thinning hair, wearing a hospital gown and lying in bed. NAD.   Eye Contact:  Good  Speech:  Clear and Coherent and Normal Rate  Volume:  Normal  Mood:  Euthymic  Affect:  Appropriate  Thought Process:  Goal Directed, Linear and Descriptions of Associations: Intact  Orientation:  Other:  Person and place.  Thought Content:  Logical  Suicidal Thoughts:  No  Homicidal Thoughts:  No  Memory:  Immediate;   Poor Recent;   Fair Remote;   Fair  Judgement:  Poor  Insight:  Shallow  Psychomotor Activity:  Normal  Concentration:  Concentration: Good and Attention Span: Good  Recall:  [employer] of Knowledge:  Fair  Language:  Good  Akathisia:  No  Handed:  Right  AIMS (if indicated):   N/A  Assets:  Communication Skills Desire for Improvement Housing Social Support  ADL's:  Impaired  Cognition:  Impaired with short term memory deficits.   Sleep:   Okay   Assessment:  Joshua Grinder  [name] is a 84 y.o. male who was admitted with acute hypoxic respiratory failure secondary to HCAP, influenza A and acute on chronic aspiration. He does not demonstrate capacity at this time to make decisions regarding PEG placement versus hospice and comfort feeding since he is unable to rationally manipulate information or identify the risk versus benefits of each  treatment. He will need an authorized representative to help him make decisions regarding his current care.   Treatment Plan Summary: -Patient does not demonstrate capacity to make decisions regarding PEG placement versus hospice and comfort feeding at this time. Please see assessment for further details.  -Psychiatry will sign off on patient at this time. Please consult psychiatry again as needed.   Disposition: No evidence of imminent risk to self or others at present.   Patient does not meet criteria for psychiatric inpatient admission.  Faythe Dingwall, DO 04/18/2017 1:46 PM

## 2017-04-18 NOTE — Progress Notes (Signed)
RN called [employer] to reprogram pt's ICD device setting to "off" per order. [employer] representative states they will turn the device off and call the RN back at [phone].

## 2017-04-18 NOTE — Progress Notes (Signed)
RN applied moisturizer to pt's lips. Pt's mouth swabbed with medline.

## 2017-04-18 NOTE — Progress Notes (Signed)
Pt tugged on feeding tube while moving to the foot of the bed. Pump now states, "Flow error: clog in line." RN flushed tube twice and pump still states flow error. MD notified for guidance.

## 2017-04-18 NOTE — Consult Note (Signed)
   [facility] CM Inpatient Consult   04/18/2017  [name] [facility] Jun 27, 1932 [id number]  Follow up:  Referral from office regarding follow up with [name], NP with Palliative Care.  Spoke with [name] regarding [facility] involvement with the patient and difficulty maintaining contact with him.  Patient was confused last week and she states now he is alert and oriented.  [name] verbalizes concerns for the patient's limited support in the area. Spoke about he was in a Care Management program with [name] for services at his last discharge, with an agreement with [facility].  Expressed that we realize his son lives in [place] and that at his last hospitalization he had a male friend who was in agreement to help.  Follow up visit with the patient at the bedside.  Patient alert and oriented.  Patient states, "I don't know what I have, I don't think I have voice mail on his phone at home."  He states, "I have a cell phone but I don't know where it is most of the time."  He states that his friend [name] has a lot going on and knows he will not be able to help because [name]'s mom is not expecting to live.  He confirms that his son lives in [place].  States, "He has his own life out there and he's pretty independent like me."  Asked him if he had ever considered going to [place] or if his son visits often.  He states, "No, I have never thought of going out there.  He [son] has his life set up out there they way he wants it. He rarely comes back this way."   Asked patient his thoughts of his needs post hospitalization.  He states, "I know I will need some rehab cause I am now unable to even really stand up good."  He states he has a neighbor, [name], who lives a couple of doors down who checks on him.  He states, "she's just a kind soul who's husband has passed away but, she just checks in on me."  Explained to patient that if he goes to rehab, [name] can follow for transition needs and likely a  Education officer, museum.  Patient verbalized agreement.  Patient asked if the brochure with contact number be placed in his duffle bag and done.  Will follow up for appropriate needs and services with [name], if applicable.  [name], RN BSN [facility] Liaison  [phone] business mobile phone Toll free office [phone]

## 2017-04-18 NOTE — Progress Notes (Addendum)
PROGRESS NOTE    [name]  XFG:[id number] DOB: Jun 22, 1932 DOA: 04/07/2017 PCP: [name], MD     Brief Narrative:  Joshua Grinder Ludwickis a 84 y.o.malewith medical history significant ofafibnot on anticoagulation due to prior subdural hemorrhage,PE,hypertension, hyperlipidemia, diet-controlled diabetes, asthma, GERD, SDH, CAD,s/p ofCABGandstent placement,sCHFwith EF 35%, AICD placement, who presents with cough, chest pain and fever of 102.1. He was found to have right lower lobe infiltrates. He was admitted for HCAP complicated by influenza A infection.  He has been treated with IV antibiotics and Tamiflu.  Throughout hospitalization, he has been intermittently confused.  Swallow evaluation revealed severe dysphagia.  He was kept n.p.o.  Eventually, cortrack was placed on 3/14 for nutrition.  Palliative care consulted for goals of care.  Assessment & Plan:   Principal Problem:   Influenza A Active Problems:   Chronic atrial fibrillation (HCC)   Chronic systolic heart failure- EF 35-40%   CAD (coronary artery disease)   HCAP (healthcare-associated pneumonia)   Thrombocytopenia (HCC)   ICD (implantable cardioverter-defibrillator) in place   Sepsis ([place])   CKD (chronic kidney disease), stage III ([place])   Acute respiratory failure with hypoxia (HCC)   Malnutrition of moderate degree   Goals of care, counseling/discussion   Palliative care by specialist   Acute hypoxic respiratory failure 2/2 to HCAP, Influenza A, and acute on chronic aspiration -Completed 5 days of IV cefepime and Tamiflu -Now on room air   Acute on chronic severe dysphagia -Modified barium swallow evaluation 04/10/2017 -N.p.o. recommended by speech therapy 04/11/2017 -Started cortrack 04/13/2017 for medication, nutrition and hydration -Dietary consult for tube feeding recommendations -?PEG. Palliative care is involved goals of care and discussion with patient and son   Acute metabolic  encephalopathy most likely multifactorial, resolved -Suspect underlying dementia plus infective process with HCAP/influenza A -Stable  Chronic A Fib -Not on anticoagulation due to hx GI bleed -Continue digoxin and metoprolol  -Rate controlled  Polyneuropathy -Continue gabapentin  Chronic systolic HFREF 96% post AICD -Continue lopressor  -Monitor urine output, I&Os, daily weight  Atypical chest pain -Left-sided chest pain overnight 3/18. Troponin x3 negative. EKG reviewed independently, A fib with nonspecific T wave changes, unchanged from previous EKG.   Goals of care -Discussed with palliative care team today. [name] has discussed with patient and son over the phone; they're in agreement to move forward with PEG. IR consult placed.    DVT prophylaxis: SCD Code Status: DNR after palliative care discussion with patient and son  Family Communication: No family at bedside, spoke with son over the phone 3/18 Disposition Plan: Pending PEG placement, will need SNF on discharge   Consultants:   none  Procedures:   none  Antimicrobials:  Anti-infectives (From admission, onward)   Start     Dose/Rate Route Frequency Ordered Stop   04/10/17 1300  oseltamivir (TAMIFLU) capsule 75 mg  Status:  Discontinued     75 mg Oral 2 times daily 04/10/17 1257 04/11/17 1247   04/08/17 2200  ceFEPIme (MAXIPIME) 1 g in sodium chloride 0.9 % 100 mL IVPB  Status:  Discontinued     1 g 200 mL/hr over 30 Minutes Intravenous Every 12 hours 04/08/17 1212 04/11/17 1247   04/08/17 0000  ceFEPIme (MAXIPIME) 1 g in sodium chloride 0.9 % 100 mL IVPB  Status:  Discontinued     1 g 200 mL/hr over 30 Minutes Intravenous Every 24 hours 04/07/17 0615 04/08/17 1212   04/07/17 1600  vancomycin (VANCOCIN) IVPB 750 mg/150  ml premix  Status:  Discontinued     750 mg 150 mL/hr over 60 Minutes Intravenous Every 12 hours 04/07/17 0638 04/08/17 1657   04/07/17 1000  oseltamivir (TAMIFLU) capsule 75 mg  Status:   Discontinued     75 mg Oral 2 times daily 04/07/17 0602 04/07/17 0636   04/07/17 1000  oseltamivir (TAMIFLU) capsule 30 mg  Status:  Discontinued     30 mg Oral 2 times daily 04/07/17 0636 04/10/17 1257   04/07/17 0300  vancomycin (VANCOCIN) 1,500 mg in sodium chloride 0.9 % 500 mL IVPB     1,500 mg 250 mL/hr over 120 Minutes Intravenous  Once 04/07/17 0220 04/07/17 0524   04/07/17 0230  ceFEPIme (MAXIPIME) 2 g in sodium chloride 0.9 % 100 mL IVPB     2 g 200 mL/hr over 30 Minutes Intravenous  Once 04/07/17 0218 04/07/17 0314   04/07/17 0230  vancomycin (VANCOCIN) IVPB 1000 mg/200 mL premix  Status:  Discontinued     1,000 mg 200 mL/hr over 60 Minutes Intravenous  Once 04/07/17 0218 04/07/17 0220       Subjective: Chest pain overnight, points to left upper chest when asked about chest pain. No issues this morning, feeling well.   Objective: Vitals:   04/17/17 2100 04/18/17 0403 04/18/17 0808 04/18/17 0823  BP: 138/70 123/81 134/79   Pulse: 86 83 (!) 115 (!) 106  Resp: 18 18    Temp: 98.2 F (36.8 C) 97.9 F (36.6 C)  97.6 F (36.4 C)  TempSrc: Oral Oral  Oral  SpO2: 98% 96%  95%  Weight:      Height:        Intake/Output Summary (Last 24 hours) at 04/18/2017 0926 Last data filed at 04/18/2017 0857 Gross per 24 hour  Intake 630 ml  Output 902 ml  Net -272 ml   Filed Weights   04/15/17 0501 04/16/17 0406 04/17/17 0355  Weight: 73.5 kg (162 lb 1.6 oz) 71.2 kg (157 lb) 75.2 kg (165 lb 11.2 oz)    Examination: General exam: Appears calm and comfortable  Respiratory system: Clear to auscultation. Respiratory effort normal. Cardiovascular system: S1 & S2 heard, Irreg rhythm. No JVD, murmurs, rubs, gallops or clicks. No pedal edema. Gastrointestinal system: Abdomen is nondistended, soft and nontender. No organomegaly or masses felt. Normal bowel sounds heard. +Cortrak  Central nervous system: Alert. No focal neurological deficits. Extremities: Symmetric 5 x 5 power. Skin:  No rashes, lesions or ulcers Psychiatry: Judgement and insight appear poor     Data Reviewed: I have personally reviewed following labs and imaging studies  CBC: Recent Labs  Lab 04/13/17 1634 04/16/17 0721  WBC 9.4 6.5  HGB 13.8 12.2*  HCT 42.6 38.5*  MCV 94.0 95.3  PLT 217 096   Basic Metabolic Panel: Recent Labs  Lab 04/14/17 0724 04/15/17 1344 04/16/17 0721 04/17/17 0633 04/18/17 0206  NA 144 142 143 139 135  K 3.1* 3.8 4.0 4.4 4.3  CL 110 109 111 105 100*  CO2 21* 24 25 23 26   GLUCOSE 158* 138* 115* 132* 125*  BUN 12 11 11 12 12   CREATININE 0.94 0.88 0.78 0.84 0.81  CALCIUM 8.4* 8.0* 8.0* 8.2* 8.3*  MG 1.5* 1.9 1.7 1.5* 1.7   GFR: Estimated Creatinine Clearance: 74.8 mL/min (by C-G formula based on SCr of 0.81 mg/dL). Liver Function Tests: No results for input(s): AST, ALT, ALKPHOS, BILITOT, PROT, ALBUMIN in the last 168 hours. No results for input(s): LIPASE, AMYLASE in  the last 168 hours. No results for input(s): AMMONIA in the last 168 hours. Coagulation Profile: No results for input(s): INR, PROTIME in the last 168 hours. Cardiac Enzymes: Recent Labs  Lab 04/17/17 2035 04/18/17 0206 04/18/17 0744  TROPONINI <0.03 <0.03 <0.03   BNP (last 3 results) No results for input(s): PROBNP in the last 8760 hours. HbA1C: No results for input(s): HGBA1C in the last 72 hours. CBG: Recent Labs  Lab 04/17/17 1519 04/17/17 2144 04/18/17 0009 04/18/17 0408 04/18/17 0822  GLUCAP 126* 141* 142* 105* 136*   Lipid Profile: No results for input(s): CHOL, HDL, LDLCALC, TRIG, CHOLHDL, LDLDIRECT in the last 72 hours. Thyroid Function Tests: No results for input(s): TSH, T4TOTAL, FREET4, T3FREE, THYROIDAB in the last 72 hours. Anemia Panel: No results for input(s): VITAMINB12, FOLATE, FERRITIN, TIBC, IRON, RETICCTPCT in the last 72 hours. Sepsis Labs: No results for input(s): PROCALCITON, LATICACIDVEN in the last 168 hours.  Recent Results (from the past 240  hour(s))  Culture, sputum-assessment     Status: None   Collection Time: 04/08/17 12:51 PM  Result Value Ref Range Status   Specimen Description SPUTUM  Final   Special Requests NONE  Final   Sputum evaluation   Final    Sputum specimen not acceptable for testing.  Please recollect.   Gram Stain Report Called to,Read Back By and Verified With: R ARYAL,RN AT 1507 04/08/17 BY L BENFIELD Performed at [facility], [place] [address] [place], [place] [address]    Report Status 04/08/2017 FINAL  Final       Radiology Studies: No results found.    Scheduled Meds: . digoxin  0.125 mg Oral Daily  . docusate  50 mg Per Tube BID  . feeding supplement (PRO-STAT SUGAR FREE 64)  30 mL Per Tube Daily  . free water  150 mL Per Tube Q4H  . guaiFENesin-dextromethorphan  15 mL Per Tube TID AC & HS  . metoprolol tartrate  12.5 mg Per Tube BID  .  morphine injection  2 mg Intravenous Once  . sennosides  5 mL Per Tube BID   Continuous Infusions: . feeding supplement (JEVITY 1.5 CAL/FIBER) 1,000 mL (04/18/17 0253)     LOS: 11 days    Time spent: 20 minutes   Dessa Phi, DO [facility] [email or web address] Password [facility] 04/18/2017, 9:26 AM

## 2017-04-18 NOTE — NC FL2 (Signed)
[place] LEVEL OF CARE SCREENING TOOL     IDENTIFICATION  Patient Name: [name] Birthdate: November 11, 1932 Sex: male Admission Date (Current Location): 04/07/2017  [facility] and [place] Number:  Herbalist and Address:  [facility], [place] [address] [place], [place] [address]      Provider Number: [id number]  Attending Physician Name and Address:  Dessa Phi, DO  Relative Name and Phone Number:       Current Level of Care: Hospital Recommended Level of Care: [place] Prior Approval Number:    Date Approved/Denied:   PASRR Number: [id number]  Discharge Plan: SNF    Current Diagnoses: Patient Active Problem List   Diagnosis Date Noted  . Goals of care, counseling/discussion   . Palliative care by specialist   . Malnutrition of moderate degree 04/13/2017  . Sepsis ([place]) 04/07/2017  . Influenza A 04/07/2017  . CKD (chronic kidney disease), stage III ([place]) 04/07/2017  . Acute respiratory failure with hypoxia ([place]) 04/07/2017  . Syncope 04/01/2017  . Acute kidney injury ([place]) 04/01/2017  . Thyroid nodule 04/01/2017  . Chronic pulmonary embolism ([place]) 03/27/2017  . Acute confusion 03/04/2017  . Failure of implantable cardioverter-defibrillator (ICD) lead 03/02/2017  . Rectal ulcer   . Rectal bleeding   . Proctitis 02/07/2017  . Abdominal pain 02/06/2017  . Acute radiation proctitis 02/06/2017  . Failure of implantable cardioverter-defibrillator lead 11/10/2016  . ICD (implantable cardioverter-defibrillator) in place 04/08/2016  . Gastroesophageal reflux disease without esophagitis 12/31/2013  . Hypotension 09/23/2011  . HCAP (healthcare-associated pneumonia) 09/23/2011  . SIRS (systemic inflammatory response syndrome) ([place]) 09/23/2011  . Thrombocytopenia ([place]) 09/23/2011  . History of subdural hemorrhage 09/08/2011  . Ataxia 08/17/2011  . Dehydration 08/17/2011  . Atrial fibrillation with  controlled ventricular response ([place]) 08/13/2011  . Colon polyp 07/07/2011  . Angiodysplasia of colon 07/07/2011  . GERD (gastroesophageal reflux disease) 05/31/2011  . Anemia, iron deficiency 12/29/2010  . Chronic anticoagulation discontinued July 2013 after developed subdural hematoma 09/22/2010  . Bradycardia 09/22/2010  . CAD (coronary artery disease) 06/25/2010  . ventricular tachycardia 12/30/2009  . Chronic atrial fibrillation ([place]) 12/30/2009  . Chronic systolic heart failure- EF 35-40% 12/30/2009  . Automatic implantable cardioverter-defibrillator in situ 12/30/2009    Orientation RESPIRATION BLADDER Height & Weight     Self, Time, Situation, Place    Continent Weight: 165 lb 11.2 oz (75.2 kg)(scale c) Height:  6\' 1"  (185.4 cm)  BEHAVIORAL SYMPTOMS/MOOD NEUROLOGICAL BOWEL NUTRITION STATUS  Other (Comment)(Anxiety) (None) Continent Feeding tube(Currently has a cortrack. Patient agreed to PEG placement today. )  AMBULATORY STATUS COMMUNICATION OF NEEDS Skin   Limited Assist Verbally Skin abrasions, Bruising, Other (Comment)(MASD, Skin tear.)                       Personal Care Assistance Level of Assistance  Bathing, Dressing Bathing Assistance: (Supervision)   Dressing Assistance: (Supervision)     Functional Limitations Info  Sight, Hearing, Speech Sight Info: Adequate Hearing Info: Adequate Speech Info: Adequate    SPECIAL CARE FACTORS FREQUENCY  PT (By licensed PT), OT (By licensed OT), Speech therapy     PT Frequency: 5 x week OT Frequency: 5 x week     Speech Therapy Frequency: 5 x week      Contractures Contractures Info: Not present    Additional Factors Info  Code Status, Allergies Code Status Info: DNR Allergies Info: Tamsulosin, Celebrex (Celecoxib), Dronedarone, Esomeprazole  Magnesium, Digoxin, Hydrocodone-acetaminophen, Protonix (Pantoprazole Sodium)           Current Medications (04/18/2017):  This is the current hospital active  medication list Current Facility-Administered Medications  Medication Dose Route Frequency Provider Last Rate Last Dose  . acetaminophen (TYLENOL) suppository 650 mg  650 mg Rectal Q6H PRN Gardiner Barefoot, NP      . calcium carbonate (TUMS - dosed in mg elemental calcium) chewable tablet 500 mg  500 mg Oral Daily PRN Ivor Costa, MD      . digoxin Fonnie Birkenhead) tablet 0.125 mg  0.125 mg Oral Daily Ivor Costa, MD   0.125 mg at 04/18/17 0810  . docusate (COLACE) 50 MG/5ML liquid 50 mg  50 mg Per Tube BID Irene Pap N, DO   50 mg at 04/17/17 2324  . feeding supplement (JEVITY 1.5 CAL/FIBER) liquid 1,000 mL  1,000 mL Per Tube Continuous Irene Pap N, DO 50 mL/hr at 04/18/17 0253 1,000 mL at 04/18/17 0253  . feeding supplement (PRO-STAT SUGAR FREE 64) liquid 30 mL  30 mL Per Tube Daily Irene Pap N, DO   30 mL at 04/18/17 0810  . fluticasone (FLONASE) 50 MCG/ACT nasal spray 1 spray  1 spray Each Nare Daily PRN Ivor Costa, MD      . free water 150 mL  150 mL Per Tube Q4H Hall, Carole N, DO   150 mL at 04/18/17 1529  . gabapentin (NEURONTIN) capsule 100-300 mg  100-300 mg Oral QHS PRN Ivor Costa, MD      . guaiFENesin-dextromethorphan [facility] DM) 100-10 MG/5ML syrup 15 mL  15 mL Per Tube TID AC & HS Dessa Phi, DO   15 mL at 04/18/17 1529  . ipratropium-albuterol (DUONEB) 0.5-2.5 (3) MG/3ML nebulizer solution 3 mL  3 mL Nebulization Q4H PRN Hall, Carole N, DO      . loratadine (CLARITIN) tablet 10 mg  10 mg Oral Daily PRN Ivor Costa, MD      . LORazepam (ATIVAN) injection 0.5 mg  0.5 mg Intravenous Daily PRN Bodenheimer, Charles A, NP   0.5 mg at 04/18/17 0031  . menthol-cetylpyridinium (CEPACOL) lozenge 3 mg  1 lozenge Oral PRN Bodenheimer, Charles A, NP      . metoprolol tartrate (LOPRESSOR) 25 mg/10 mL oral suspension 12.5 mg  12.5 mg Per Tube BID Dessa Phi, DO   12.5 mg at 04/18/17 0810  . morphine 2 MG/ML injection 1 mg  1 mg Intravenous Q3H PRN Gardiner Barefoot, NP   1 mg at  04/11/17 0031  . morphine 2 MG/ML injection 2 mg  2 mg Intravenous Once Bodenheimer, Charles A, NP      . nitroGLYCERIN (NITROSTAT) SL tablet 0.4 mg  0.4 mg Sublingual Q5 min PRN Ivor Costa, MD   0.4 mg at 04/17/17 2043  . phenol (CHLORASEPTIC) mouth spray 1 spray  1 spray Mouth/Throat PRN Ivor Costa, MD   1 spray at 04/18/17 0125  . sennosides (SENOKOT) 8.8 MG/5ML syrup 5 mL  5 mL Per Tube BID Irene Pap N, DO   5 mL at 04/17/17 2325     Discharge Medications: Please see discharge summary for a list of discharge medications.  Relevant Imaging Results:  Relevant Lab Results:   Additional Information SS#: [id number]  Candie Chroman, LCSW

## 2017-04-18 NOTE — Progress Notes (Signed)
Patient complained of mid chest pain. MD has been notified and patient received an EKG and 3 sublingual Nitro's. After 3 Nitros patient stated that pain was 0/0. Will continue to monitor.    Drue Flirt, RN

## 2017-04-18 NOTE — Progress Notes (Signed)
Patient ID: [name], male   DOB: 07/14/1932, 84 y.o.   MRN: [id number]  This NP visited patient at the bedside as a follow for palliative medcine needs and emotional suppport.  He has very little social support and lives alone in his own duplex.  It sounds like he may have a few neighbors who are support.  He tells me he has a son who lives in [place].  They talk on the phone once a week but basically "stay out of each other's lives".    At bedside with patient and continued  conversation regarding current medical situation.  Patient today is able to have  a conversation with me regarding his current medicial situation.  Currently he is able to take in information, manipulate information and speaks to the pros and cons of decisions being posed to him specifically regarding artificial feeding and resuscitation in the event of a cardiac arrest.  He understands that he has multiple co-morbidites and the importance of making healthcare decisions and consideration of long-term care and prognosis.  States "I guess at this time I start thinking about these things".  Patient clearly is able to speak to the importance to him of comfort and dignity at end of life and agrees to DNR/DNI status and the activating ICD  He also speaks to his desire to have a PEG tube placed for artificial feeding and hydration.  States "I guess I have to do this if I want to live".  We discussed the risks and benefits to artificial feeding via PEG tube.  I strongly recommended that this intervention be considered on a trial basis and to reexamine continued utilization of the PEG tube in 4-6 weeks depending on outcomes at the skilled nursing facility.  Fortunately his son called me back and we were able to have a conference call in the patient's room addressing all the above decisions.  Patient again was able to verbalize his wishes to his son and the son is in full agreement.  His son now realizes the seriousness of the  situation and the importance of his involvement in his father's life.   He hopes to come to the [facility] in the near future  PMT will continue to support holistically discussed with Dr. Maylene Roes  Time in   1420      Time out  1520  Total time spent on the unit was 60  minutes  Greater than 50% of the time was spent in counseling and coordination of care  Wadie Lessen NP  Palliative Medicine Team Team Phone # [phone] Pager [phone]

## 2017-04-18 NOTE — Progress Notes (Signed)
[employer] representative states they will see pt in the morning to turn ICD device off.

## 2017-04-18 NOTE — Progress Notes (Signed)
MD states to cap TF until further notified.

## 2017-04-19 ENCOUNTER — Encounter (HOSPITAL_COMMUNITY): Payer: Self-pay | Admitting: Physician Assistant

## 2017-04-19 ENCOUNTER — Inpatient Hospital Stay (HOSPITAL_COMMUNITY): Payer: Medicare Other

## 2017-04-19 DIAGNOSIS — R627 Adult failure to thrive: Secondary | ICD-10-CM

## 2017-04-19 DIAGNOSIS — R1310 Dysphagia, unspecified: Secondary | ICD-10-CM

## 2017-04-19 DIAGNOSIS — E440 Moderate protein-calorie malnutrition: Secondary | ICD-10-CM

## 2017-04-19 HISTORY — PX: IR GASTROSTOMY TUBE MOD SED: IMG625

## 2017-04-19 LAB — BASIC METABOLIC PANEL
Anion gap: 11 (ref 5–15)
BUN: 11 mg/dL (ref 6–20)
CHLORIDE: 101 mmol/L (ref 101–111)
CO2: 24 mmol/L (ref 22–32)
CREATININE: 0.84 mg/dL (ref 0.61–1.24)
Calcium: 8.5 mg/dL — ABNORMAL LOW (ref 8.9–10.3)
GFR calc non Af Amer: >60 mL/min (ref >60)
Glucose, Bld: 126 mg/dL — ABNORMAL HIGH (ref 65–99)
POTASSIUM: 4.1 mmol/L (ref 3.5–5.1)
SODIUM: 136 mmol/L (ref 135–145)

## 2017-04-19 LAB — MAGNESIUM: MAGNESIUM: 1.5 mg/dL — AB (ref 1.7–2.4)

## 2017-04-19 LAB — GLUCOSE, CAPILLARY
GLUCOSE-CAPILLARY: 111 mg/dL — AB (ref 65–99)
GLUCOSE-CAPILLARY: 125 mg/dL — AB (ref 65–99)
GLUCOSE-CAPILLARY: 147 mg/dL — AB (ref 65–99)
Glucose-Capillary: 123 mg/dL — ABNORMAL HIGH (ref 65–99)
Glucose-Capillary: 115 mg/dL — ABNORMAL HIGH (ref 65–99)
Glucose-Capillary: 139 mg/dL — ABNORMAL HIGH (ref 65–99)

## 2017-04-19 MED ORDER — CEFAZOLIN SODIUM-DEXTROSE 2-4 GM/100ML-% IV SOLN
INTRAVENOUS | Status: AC
  Filled 2017-04-19: qty 100

## 2017-04-19 MED ORDER — IOPAMIDOL (ISOVUE-300) INJECTION 61%
INTRAVENOUS | Status: AC
  Administered 2017-04-19: 10 mL
  Filled 2017-04-19: qty 50

## 2017-04-19 MED ORDER — GLUCAGON HCL RDNA (DIAGNOSTIC) 1 MG IJ SOLR
INTRAMUSCULAR | Status: AC
  Filled 2017-04-19: qty 1

## 2017-04-19 MED ORDER — MIDAZOLAM HCL 2 MG/2ML IJ SOLN
INTRAMUSCULAR | Status: AC
  Filled 2017-04-19: qty 2

## 2017-04-19 MED ORDER — FENTANYL CITRATE (PF) 100 MCG/2ML IJ SOLN
INTRAMUSCULAR | Status: AC
  Filled 2017-04-19: qty 2

## 2017-04-19 MED ORDER — FENTANYL CITRATE (PF) 100 MCG/2ML IJ SOLN
INTRAMUSCULAR | Status: AC | PRN
  Administered 2017-04-19: 12.5 ug via INTRAVENOUS

## 2017-04-19 MED ORDER — HYOSCYAMINE SULFATE 0.125 MG SL SUBL
0.2500 mg | SUBLINGUAL_TABLET | Freq: Once | SUBLINGUAL | Status: AC
  Administered 2017-04-19: 0.25 mg via SUBLINGUAL
  Filled 2017-04-19: qty 2

## 2017-04-19 MED ORDER — CEFAZOLIN SODIUM-DEXTROSE 2-4 GM/100ML-% IV SOLN
2.0000 g | INTRAVENOUS | Status: AC
  Administered 2017-04-19: 2 g via INTRAVENOUS

## 2017-04-19 MED ORDER — KETOROLAC TROMETHAMINE 30 MG/ML IJ SOLN
30.0000 mg | Freq: Four times a day (QID) | INTRAMUSCULAR | Status: AC | PRN
  Administered 2017-04-19 – 2017-04-21 (×2): 30 mg via INTRAVENOUS
  Filled 2017-04-19 (×2): qty 1

## 2017-04-19 MED ORDER — ONDANSETRON HCL 4 MG/2ML IJ SOLN: 4.0000 mg | INTRAMUSCULAR | Status: DC | PRN

## 2017-04-19 MED ORDER — MIDAZOLAM HCL 2 MG/2ML IJ SOLN
INTRAMUSCULAR | Status: AC | PRN
  Administered 2017-04-19: 1 mg via INTRAVENOUS

## 2017-04-19 MED ORDER — MAGNESIUM SULFATE 4 GM/100ML IV SOLN
4.0000 g | Freq: Once | INTRAVENOUS | Status: AC
  Administered 2017-04-19: 4 g via INTRAVENOUS
  Filled 2017-04-19: qty 100

## 2017-04-19 MED ORDER — FREE WATER
180.0000 mL | Freq: Four times a day (QID) | Status: DC
  Administered 2017-04-20 – 2017-04-21 (×5): 180 mL

## 2017-04-19 MED ORDER — JEVITY 1.5 CAL/FIBER PO LIQD
360.0000 mL | Freq: Four times a day (QID) | ORAL | Status: DC
  Administered 2017-04-20 – 2017-04-21 (×4): 360 mL
  Filled 2017-04-19 (×10): qty 1000

## 2017-04-19 MED ORDER — LIDOCAINE HCL 1 % IJ SOLN
INTRAMUSCULAR | Status: AC
  Filled 2017-04-19: qty 20

## 2017-04-19 MED ORDER — LIDOCAINE HCL (PF) 1 % IJ SOLN
INTRAMUSCULAR | Status: AC | PRN
  Administered 2017-04-19: 8 mL

## 2017-04-19 NOTE — Progress Notes (Signed)
Physical Therapy Treatment Patient Details Name: [name] MRN: [id number] DOB: 08-23-1932 Today's Date: 04/19/2017    History of Present Illness 84 y.o. male with medical history significant of afib not on anticoagulation due to prior subdural hemorrhage, PE, hypertension, hyperlipidemia, diet-controlled diabetes, asthma, GERD, SDH, CAD, s/p of CABG and stent placement, sCHF with EF 35%, AICD placement, who presents with cough, chest pain and fever.  Admitted for HCAP complicated by influenza A infection.    PT Comments    Pt performed standing and LE exercises.  Pt is status post PEG placement on 3/20 and limited due to procedure.  Pt tolerated session.     Follow Up Recommendations  SNF;Supervision/Assistance - 24 hour     Equipment Recommendations  None recommended by PT    Recommendations for Other Services       Precautions / Restrictions Precautions Precautions: Fall;Other (comment) Precaution Comments: Hx of falls at home, PEG tube placement on 3/20 Required Braces or Orthoses: Other Brace/Splint Other Brace/Splint: abdominal binder Restrictions Weight Bearing Restrictions: No LUE Weight Bearing: (Pt does not have weight bearing restrictions. )    Mobility  Bed Mobility Overal bed mobility: Needs Assistance Bed Mobility: Supine to Sit     Supine to sit: Min assist;HOB elevated Sit to supine: HOB elevated;Min guard   General bed mobility comments: Pt performed bed mobility with cues for technique.  Pre bed mobility performed rolling to place abdominal binder.    Transfers Overall transfer level: Needs assistance Equipment used: Rolling walker (2 wheeled) Transfers: Sit to/from Stand           General transfer comment: verbal cues for hand placement. Assist to power up. Pt performed from edge of bed x2.  Pt reports mild dizziness in standing.  Ambulation/Gait Ambulation/Gait assistance: Min assist(assistance to move RW sideways.  ) Ambulation  Distance (Feet): (side stepping to HOB around 4 ft.  ) Assistive device: Rolling walker (2 wheeled) Gait Pattern/deviations: Step-through pattern;Shuffle;Trunk flexed     General Gait Details: Cues for upper trunk control and assistance with RW.    Stairs            Wheelchair Mobility    Modified Rankin (Stroke Patients Only)       Balance   Sitting-balance support: No upper extremity supported;Feet supported Sitting balance-Leahy Scale: Good       Standing balance-Leahy Scale: Fair                              Cognition Arousal/Alertness: Awake/alert Behavior During Therapy: WFL for tasks assessed/performed Overall Cognitive Status: Within Functional Limits for tasks assessed                                 General Comments: He appears appropriate during session this am.  Able to recall past event accurately and he is alert and oriented x4.        Exercises Other Exercises Other Exercises: Pt performed 1x20 APs, 1x10 B hip abd, 1x10 B SLRs.      General Comments        Pertinent Vitals/Pain Pain Assessment: Faces Pain Score: 5  Faces Pain Scale: Hurts little more Pain Location: surgical site Pain Descriptors / Indicators: Aching Pain Intervention(s): Monitored during session;Repositioned    Home Living  Prior Function            PT Goals (current goals can now be found in the care plan section) Acute Rehab PT Goals Patient Stated Goal: feel better Potential to Achieve Goals: Good Progress towards PT goals: Progressing toward goals    Frequency    Min 3X/week      PT Plan Current plan remains appropriate    Co-evaluation              AM-PAC PT "6 Clicks" Daily Activity  Outcome Measure  Difficulty turning over in bed (including adjusting bedclothes, sheets and blankets)?: A Little Difficulty moving from lying on back to sitting on the side of the bed? : Unable Difficulty  sitting down on and standing up from a chair with arms (e.g., wheelchair, bedside commode, etc,.)?: Unable Help needed moving to and from a bed to chair (including a wheelchair)?: A Little Help needed walking in hospital room?: A Little Help needed climbing 3-5 steps with a railing? : A Lot 6 Click Score: 13    End of Session Equipment Utilized During Treatment: Gait belt Activity Tolerance: Patient limited by lethargy Patient left: in bed;with call bell/phone within reach;with nursing/sitter in room Nurse Communication: Mobility status PT Visit Diagnosis: Unsteadiness on feet (R26.81);Muscle weakness (generalized) (M62.81);History of falling (Z91.81)     Time: 6387-5643 PT Time Calculation (min) (ACUTE ONLY): 37 min  Charges:  $Therapeutic Activity: 23-37 mins                    G CodesGovernor Rooks, PTA pager [phone]    [name] 04/19/2017, 6:10 PM

## 2017-04-19 NOTE — Procedures (Signed)
  Procedure: 43f gastrostomy placed   EBL:   minimal Complications:  none immediate  See full dictation in [facility].  Dillard [name] MD Main # [phone] Pager  [phone]

## 2017-04-19 NOTE — Progress Notes (Signed)
Patient pulled out IV. MD is okay with patient not having IV access at this time.   Drue Flirt, RN

## 2017-04-19 NOTE — Progress Notes (Signed)
Pt arrived from IR, within the last hour.

## 2017-04-19 NOTE — Progress Notes (Signed)
Nutrition Follow-up  DOCUMENTATION CODES:   Non-severe (moderate) malnutrition in context of chronic illness  INTERVENTION:   -Once PEG is deemed appropriate to use:   Initiate bolus feedings of 360 ml Jevity 1.5 via PEG  90 ml free water flush before and after each feeding administration   Tube feeding regimen provides 2160 kcal (>100% of needs), 92 grams of protein, and 1095 ml of H2O. (Total free water: 1815 ml)  NUTRITION DIAGNOSIS:   Moderate Malnutrition related to chronic illness(CHF, SDH) as evidenced by mild fat depletion, moderate fat depletion, mild muscle depletion, moderate muscle depletion, percent weight loss.  Ongoing  GOAL:   Patient will meet greater than or equal to 90% of their needs  Progressing  MONITOR:   Diet advancement, Weight trends, TF tolerance, Skin, I & O's  REASON FOR ASSESSMENT:   Consult Assessment of nutrition requirement/status, Enteral/tube feeding initiation and management  ASSESSMENT:   [name] is a 84 y.o. male with medical history significant of afib not on anticoagulation due to prior subdural hemorrhage, PE, hypertension, hyperlipidemia, diet-controlled diabetes, asthma, GERD, SDH, CAD, s/p of CABG and stent placement, sCHF with EF 35%, AICD placement, who presents with cough, chest pain or fever.  3/10- s/p BSE- recommend NPO 3/11- s/p MBSS- recommend continue NPO 3/12- s/p repeat BSE- recommend continued NPO with nutrition via alternative needs 3/14- cortrak tube placed; tip of tube confirmed post-pyloric, TF initiated 3/19- repeat BSE, recommended continued NPO with nutrition via alternative means; TF stopped PM due to clogged tube 3/20- PEG placed today via IR, cortrak tube removed  Palliative met with pt on 3/19; pt still desires PEG, which was placed earlier today.   Noted pt no longer with cortrak tube.   Case discussed with RN and Dr. Grandville Silos (hospitalist). Pt just returned from PEG placement and  confirmed plan to hold off on feedings until tomorrow morning per IR instructions.   Labs reviewed: CBGS: 115-147, Mg: 1.5 (on IV supplementation).   Diet Order:  Fall precautions Diet NPO time specified Except for: [employer]  EDUCATION NEEDS:   Education needs have been addressed  Skin:  Skin Assessment: Skin Integrity Issues: Skin Integrity Issues:: Other (Comment) Other: skin tear rt and lt elbows  Last BM:  04/16/17  Height:   Ht Readings from Last 1 Encounters:  04/07/17 _0  (1.854 m)    Weight:   Wt Readings from Last 1 Encounters:  04/19/17 160 lb 1.6 oz (72.6 kg)    Ideal Body Weight:  83.6 kg  BMI:  Body mass index is 21.12 kg/m.  Estimated Nutritional Needs:   Kcal:  1850-2050  Protein:  90-105 grams  Fluid:  1.8-2.0 L    Kaley Jutras A. Jimmye Norman, RD, LDN, CDE Pager: [phone] After hours Pager: [phone]

## 2017-04-19 NOTE — Progress Notes (Signed)
Patient ID: [name], male   DOB: [birth year], 84 y.o.   MRN: [id number]  This NP visited patient at the bedside as a follow for palliative medcine needs and emotional suppport.  Post PEG placement, patient reports no complaints or issues.  Placed call to son/main support person [name] in [place] and updated him.    Ultimate goal is for patient to transition to skilled nursing facility for rehab once he is medically stable. Discussed with both patient and his son my concern regarding the patient's ability to rehab to a level that he would be safe to live independently.  Both recognize his high risk for decompensation.  Discussed with patient the importance of continued conversation with family and their  medical providers regarding overall plan of care and treatment options,  ensuring decisions are within the context of the patients values and GOCs.  Decisions related to treatment options, advance directives and anticipatory care needs will remain on the forefront for this patient and family.  PMT will continue to support holistically.  Recommend palliative care services on discharge.  Time in   1730      Time out  1800  Total time spent on the unit was 30  minutes  Greater than 50% of the time was spent in counseling and coordination of care  Wadie Lessen NP  Palliative Medicine Team Team Phone # [phone] Pager [phone]

## 2017-04-19 NOTE — H&P (Signed)
Chief Complaint: Dysphagia  Referring Physician(s): Dessa Phi  Supervising Physician: Arne Cleveland  Patient Status: [facility] - In-pt  History of Present Illness: [name] is a 84 y.o. male with extensive PMHx significant for A-fib (not on anticoagulation due to prior subdural hemorrhage),PE,hypertension, hyperlipidemia, diet-controlled diabetes, asthma, GERD, SDH, CAD,s/p ofCABGandstent placement,sCHFwith EF 35%, and AICD placement.  He intially presented with cough, chest pain and fever of 102.1.   He was found to have right lower lobe infiltrates.   He was admitted for HCAP complicated by influenza A infection.    He has been treated with IV antibiotics and Tamiflu.    Throughout hospitalization, he has been intermittently confused.    Swallow evaluation revealed severe dysphagia.    He has been kept NPO.  Eventually, cortrack was placed on 3/14 for nutrition.    We are asked to place a percutaneous Gastrostomy tube.  Past Medical History:  Diagnosis Date  . Abnormal thyroid scan    Abnormal thyroid imaging studies from 11/09/2010, status post ultrasound guided fine needle aspiration of the dominant left inferior thyroid nodule on 12/15/2010. Cytology report showed rare follicular epithelial cells and hemosiderin laden macrophages.  . Adenomatous colon polyp   . AICD (automatic cardioverter/defibrillator) present    a. fx lead; a. s/p lead extraction 03/02/17  . Arthritis    "all over"  . Asthma   . Atrial fibrillation ([place])    on chronic Coumadin; stopped July 2013 due to subdural hematomas  . CHF (congestive heart failure) (HCC)    EF 35-40% s/p most recent ICD generator change-out with Medtronic dual-chamber ICD 05/20/11 with explantation of previous abdominally-implanted device  . Coronary artery disease    s/p CABG 1983 and PCI/stent 2004.   . Diabetes mellitus    diet controlled  . Diverticulosis   . Dyslipidemia   . Enteritis   .  Erythrocytosis   . GERD (gastroesophageal reflux disease)   . Hepatic steatosis   . Hypertension   . Ischemic cardiomyopathy    WITH CHF  . Monocytosis 04/17/2013  . Myocardial infarction ([place]) 1983; ~ 1990  . Pleural effusion    right  . Pneumonia August 2013  . Portal hypertensive gastropathy ([place])   . Rectal ulcer   . Renal calculi   . Subdural hematoma [facility]) July 2013   Anticoagulation stopped.   . SunDown syndrome   . VT (ventricular tachycardia) ([place])     Past Surgical History:  Procedure Laterality Date  . CHOLECYSTECTOMY    . COLONOSCOPY  07/07/2011   Procedure: COLONOSCOPY;  Surgeon: Jerene Bears, MD;  Location: [facility];  Service: Gastroenterology;  Laterality: N/A;  . CORONARY ANGIOPLASTY WITH STENT PLACEMENT  2004   Tandem Cypher stents LAD  . CORONARY ARTERY BYPASS GRAFT  1983   SVG-mLAD  . ESOPHAGOGASTRODUODENOSCOPY  02/11/2011   Procedure: ESOPHAGOGASTRODUODENOSCOPY (EGD);  Surgeon: Beryle Beams, MD;  Location: [facility];  Service: Endoscopy;  Laterality: N/A;  . FLEXIBLE SIGMOIDOSCOPY N/A 02/08/2017   Procedure: FLEXIBLE SIGMOIDOSCOPY;  Surgeon: Irene Shipper, MD;  Location: [facility];  Service: Endoscopy;  Laterality: N/A;  . ICD GENERATOR CHANGEOUT N/A 04/08/2016   Procedure: ICD Generator Changeout;  Surgeon: Evans Lance, MD;  Location: [place] [facility];  Service: Cardiovascular;  Laterality: N/A;  . ICD LEAD REMOVAL N/A 03/02/2017   Procedure: ICD LEAD REMOVAL;  Surgeon: Evans Lance, MD;  Location: [place];  Service: Cardiovascular;  Laterality: N/A;  . IMPLANTABLE CARDIOVERTER  DEFIBRILLATOR (ICD) GENERATOR CHANGE N/A 05/20/2011   Procedure: ICD GENERATOR CHANGE;  Surgeon: Evans Lance, MD;  Medtronic secure dual-chamber ICD serial number [id number]   . KNEE ARTHROSCOPY     right; "just went in and scraped it"  . LEAD INSERTION N/A 03/02/2017   Procedure: LEAD INSERTION;  Surgeon: Evans Lance, MD;  Location: [place];  Service: Cardiovascular;   Laterality: N/A;  . MASS EXCISION Right 05/10/2013   Procedure: EXCISION MASS RIGHT THUMB;  Surgeon: Wynonia Sours, MD;  Location: [place];  Service: Orthopedics;  Laterality: Right;  . PROXIMAL INTERPHALANGEAL FUSION (PIP) Right 05/10/2013   Procedure: DEBRIDEMENT PROXIMAL INTERPHALANGEAL FUSION (PIP);  Surgeon: Wynonia Sours, MD;  Location: [place];  Service: Orthopedics;  Laterality: Right;  . TONSILLECTOMY          Allergies: Tamsulosin; Celebrex [celecoxib]; Dronedarone; Esomeprazole magnesium; Digoxin; Hydrocodone-acetaminophen; and Protonix [pantoprazole sodium]  Medications: Prior to Admission medications   Medication Sig Start Date End Date Taking? Authorizing Provider  acetaminophen (TYLENOL) 325 MG tablet Take 2 tablets (650 mg total) by mouth every 6 (six) hours as needed for mild pain (or Fever >/= 101). 04/03/17  Yes Georgette Shell, MD  ADVAIR DISKUS 250-50 MCG/DOSE AEPB Inhale 1 puff into the lungs 2 (two) times daily.  11/13/14  Yes [provider]  calcium carbonate (TUMS - DOSED IN MG ELEMENTAL CALCIUM) 500 MG chewable tablet Chew 500 mg by mouth daily as needed for heartburn.    Yes [provider]  digoxin (LANOXIN) 0.125 MG tablet Take 1 tablet (0.125 mg total) by mouth daily. 02/10/17  Yes Short, Noah Delaine, MD  fluticasone (FLONASE) 50 MCG/ACT nasal spray Place 2 sprays into both nostrils daily. Patient taking differently: Place 1 spray into both nostrils daily as needed for allergies.  01/04/17  Yes [name], MD  furosemide (LASIX) 40 MG tablet Take 0.5 tablets (20 mg total) by mouth daily. May take an extra tab daily as needed for swelling 04/03/17  Yes Georgette Shell, MD  gabapentin (NEURONTIN) 100 MG capsule TAKE 1 CAPSULE (100MG ) TO 3 CAPSULES (300MG ) EACH NIGHT AS NEEDED FOR MUSCLE PAINS Patient taking differently: Take 100-300 mg by mouth at bedtime as needed (for mucle pains).  10/05/16  Yes [name], MD  lisinopril (PRINIVIL,ZESTRIL) 10 MG tablet Take 0.5 tablets (5 mg total) by mouth daily. 04/03/17  Yes Georgette Shell, MD  loratadine (CLARITIN) 10 MG tablet Take 10 mg by mouth daily as needed for allergies.    Yes [provider]  metoprolol succinate (TOPROL-XL) 25 MG 24 hr tablet Take 1 tablet (25 mg total) by mouth daily. 02/10/17  Yes Short, Noah Delaine, MD  nitroGLYCERIN (NITROSTAT) 0.4 MG SL tablet PLACE ONE UNDER TONGUE FOR CHEST PAIN. Patient taking differently: Place 0.4 mg under tongue as needed for chest pain 11/22/16  Yes [name], Peter M, MD  phenol (CHLORASEPTIC) 1.4 % LIQD Use as directed 1 spray in the mouth or throat as needed for throat irritation / pain. 04/04/17  Yes Georgette Shell, MD  potassium chloride (K-DUR,KLOR-CON) 10 MEQ tablet Take 1 tablet (10 mEq total) by mouth daily. 01/09/13  Yes [name], Peter M, MD  traMADol (ULTRAM) 50 MG tablet TAKE 2 TABLETS BY MOUTH EVERY 8 HOURS AS NEEDED FOR PAIN Patient taking differently: Take 100 mg by mouth every 8 (eight) hours as needed for moderate pain.  03/24/17  Yes [name], MD  RESTASIS  0.05 % ophthalmic emulsion INSTILL 1 DROP INTO BOTH EYES TWICE A DAY Patient not taking: Reported on 04/01/2017 11/17/16   [name], MD     Family History  Problem Relation Age of Onset  . Tuberculosis Mother   . Tuberculosis Father   . Heart disease Brother   . Diabetes Sister   . Diabetes Brother   . Clotting disorder Brother     Social History   Socioeconomic History  . Marital status: Divorced    Spouse name: None  . Number of children: 2  . Years of education: None  . Highest education level: None  Social Needs  . Financial resource strain: None  . Food insecurity - worry: None  . Food insecurity - inability: None  . Transportation needs - medical: None  . Transportation needs - non-medical: None  Occupational History  . Occupation: Sports coach: RETIRED  Tobacco Use  . Smoking  status: Former Smoker    Packs/day: 2.00    Years: 30.00    Pack years: 60.00    Types: Cigarettes    Last attempt to quit: 06/23/1976    Years since quitting: 40.8  . Smokeless tobacco: Never Used  Substance and Sexual Activity  . Alcohol use: No  . Drug use: No  . Sexual activity: No  Other Topics Concern  . None  Social History Narrative  . None   Review of Systems  Unable to perform ROS: Mental status change    Vital Signs: BP 132/72 (BP Location: Right Arm)   Pulse (!) 102   Temp 98 F (36.7 C) (Oral)   Resp 18   Ht 6\' 1"  (1.854 m)   Wt 160 lb 1.6 oz (72.6 kg) Comment: scale c  SpO2 91%   BMI 21.12 kg/m   Physical Exam  Constitutional: He is oriented to person, place, and time. He appears well-developed.  HENT:  Head: Normocephalic and atraumatic.  Eyes: EOM are normal.  Neck: Normal range of motion.  Cardiovascular: Normal rate, regular rhythm and normal heart sounds.  Pulmonary/Chest: Effort normal and breath sounds normal.  Abdominal: Soft.  Scar over the right upper quadrant, but not the left. Anatomy ok for Gtube  Musculoskeletal: Normal range of motion.  Neurological: He is alert and oriented to person, place, and time.  Skin: Skin is warm and dry.  Psychiatric: He has a normal mood and affect. His behavior is normal. Judgment and thought content normal.  Vitals reviewed.   Imaging: Dg Chest 2 View  Result Date: 04/07/2017 CLINICAL DATA:  Fever and cough EXAM: CHEST - 2 VIEW COMPARISON:  04/01/2017 CT chest 03/26/2017, radiographs 03/25/2017, 03/03/2017, 12/23/2016 FINDINGS: Post sternotomy changes. Left-sided pacing device as before. Borderline to mild cardiomegaly. Aortic atherosclerosis. Patchy infiltrate at the right base. No pneumothorax. Degenerative changes of the spine. IMPRESSION: Suspected patchy infiltrate at the right base, short interval radiographic follow-up suggested to ensure clearing. Cardiomegaly. Electronically Signed   By: Donavan Foil M.D.   On: 04/07/2017 03:33   Dg Chest 2 View  Result Date: 04/01/2017 CLINICAL DATA:  Pain after fall EXAM: CHEST  2 VIEW COMPARISON:  March 25, 2017 FINDINGS: Postsurgical changes in the chest with sternotomy wires. Mild cardiomegaly. The hila and mediastinum are normal. No pneumothorax. No nodules or masses. No focal infiltrates. IMPRESSION: No active cardiopulmonary disease. Electronically Signed   By: Dorise Bullion III M.D   On: 04/01/2017 12:42   Dg Chest 2 View  Result Date: 03/25/2017 CLINICAL DATA:  Fall with left-sided pain EXAM: CHEST  2 VIEW COMPARISON:  03/23/2017, 03/03/2017, 12/23/2016 FINDINGS: Post sternotomy changes. Left-sided pacing device as before. Hyperinflation with tiny pleural effusion or pleural scarring. Multiple epicardial leads. No consolidation. No pneumothorax. Stable cardiomediastinal silhouette with aortic atherosclerosis. Degenerative changes of the spine IMPRESSION: No active cardiopulmonary disease. Stable tiny pleural effusions or pleural thickening. Hyperinflation. Electronically Signed   By: Donavan Foil M.D.   On: 03/25/2017 02:43   Dg Pelvis 1-2 Views  Result Date: 04/01/2017 CLINICAL DATA:  Fall.  Pain. EXAM: PELVIS - 1-2 VIEW COMPARISON:  None. FINDINGS: Degenerative changes in the hips, right greater than left. No acute fractures are seen. IMPRESSION: Degenerative changes in the hips, right greater than left. Electronically Signed   By: Dorise Bullion III M.D   On: 04/01/2017 12:43   Ct Head Wo Contrast  Result Date: 04/01/2017 CLINICAL DATA:  Found down by a friend who last saw the patient yesterday. EXAM: CT HEAD WITHOUT CONTRAST CT CERVICAL SPINE WITHOUT CONTRAST TECHNIQUE: Multidetector CT imaging of the head and cervical spine was performed following the standard protocol without intravenous contrast. Multiplanar CT image reconstructions of the cervical spine were also generated. COMPARISON:  Head CT - 03/26/2017; 03/24/2015; 09/10/2012  FINDINGS: CT HEAD FINDINGS Brain: Similar findings atrophy with sulcal prominence. Scattered periventricular hypodensities compatible microvascular ischemic disease, unchanged. Given background parenchymal abnormalities, there is no CT evidence of superimposed acute large territory infarct. No intraparenchymal or extra-axial mass or hemorrhage. Unchanged size and configuration of the ventricles and the basilar cisterns. No midline shift. Vascular: Intracranial atherosclerosis. Skull: No displaced calvarial fracture. Sinuses/Orbits: Limited visualization of the paranasal sinuses and mastoid air cells is normal. No air-fluid levels. Other: Regional soft tissues appear normal. CT CERVICAL SPINE FINDINGS Alignment: C1 to the superior endplate of T2 is imaged. There is straightening expected cervical lordosis. No anterolisthesis or retrolisthesis. The dens is normally positioned between the lateral masses of C1. Mild-to-moderate degenerative change of the atlantodental articulation. Normal atlantoaxial articulations. Skull base and vertebrae: Cervical vertebral body heights are preserved. Prevertebral soft tissues are normal. Soft tissues and spinal canal: Regional soft tissues appear normal. No bulky cervical lymphadenopathy on this noncontrast examination. There is partial ossification the nuchal ligament. Disc levels: There is multi level flowing osteophytosis and ankylosis throughout the cervical spine. Prominent posteriorly directed disc osteophyte complexes are seen most conspicuously at C3-C4 (resulting in at least moderate spinal stenosis at this location-axial image 39, series 8; sagittal image 28, series 4), and to a lesser extent, C5-C6 and C4-C5. Upper chest: Limited visualization the lung apices demonstrates minimal biapical pleuroparenchymal thickening. Other: Note is made of a large (approximately 3.8 x 2.8 cm nodule/mass nearly replacing the left lobe of the thyroid which is noted to contain several  internal calcifications. Heterogeneous appearance of the imaged portions of the right lobe of the thyroid. IMPRESSION: 1. Similar findings of atrophy and microvascular ischemic disease without superimposed acute intracranial process. 2. No fracture or static subluxation of the cervical spine. 3. Multi level flowing osteophytosis and ankylosis throughout the cervical spine compatible with DISH. 4. Prominent posteriorly directed disc osteophyte complexes are seen throughout the cervical spine, worse at C3-C4, resulting at least moderate spinal stenosis at this location. Further evaluation with dedicated nonemergent cervical spine MRI performed as indicated. 5. Incidentally noted approximately 3.8 cm nodule/mass nearly replacing the left lobe of the thyroid. Further evaluation with dedicated nonemergent thyroid ultrasound could be performed as clinically indicated.  Electronically Signed   By: Sandi Mariscal M.D.   On: 04/01/2017 13:21   Ct Head Wo Contrast  Result Date: 03/26/2017 CLINICAL DATA:  Fall today. Posttraumatic headache. Initial encounter. Personal history of subdural hematoma. EXAM: CT HEAD WITHOUT CONTRAST TECHNIQUE: Contiguous axial images were obtained from the base of the skull through the vertex without intravenous contrast. COMPARISON:  03/24/2015 FINDINGS: Brain: No evidence of acute infarction, hemorrhage, hydrocephalus, extra-axial collection, or mass lesion/mass effect. Stable mild cerebral atrophy and chronic small vessel disease. Vascular:  No hyperdense vessel or other acute findings. Skull: No evidence of fracture or other significant bone abnormality. Sinuses/Orbits:  No acute findings. Other: None. IMPRESSION: No acute intracranial abnormality. Stable mild cerebral atrophy and chronic small vessel disease. Electronically Signed   By: Earle Gell M.D.   On: 03/26/2017 20:52   Ct Chest W Contrast  Result Date: 03/26/2017 CLINICAL DATA:  84 year old male with abdominal trauma. Generalized  soreness secondary to fall. EXAM: CT CHEST, ABDOMEN, AND PELVIS WITH CONTRAST TECHNIQUE: Multidetector CT imaging of the chest, abdomen and pelvis was performed following the standard protocol during bolus administration of intravenous contrast. CONTRAST:  67mL ISOVUE-300 IOPAMIDOL (ISOVUE-300) INJECTION 61% COMPARISON:  Chest radiograph dated 03/25/2017 and CT of the abdomen pelvis dated 02/06/2017 FINDINGS: CT CHEST FINDINGS Cardiovascular: Mild cardiomegaly with dilatation of the right atrium. No pericardial effusion. Postsurgical changes and epicardial wires noted. Left pectoral pacemaker device. There is moderate atherosclerotic calcification of the thoracic aorta. No aneurysmal dilatation or dissection. There is thickened appearance of the wall of the left brachiocephalic trunk. There is partially occlusive thrombus in the subsegmental right lower lobe pulmonary artery branch which may represent residual thrombus prior PE or scarring with acute nonocclusive thrombus is less likely but not excluded. Correlation with history of previous PE recommended. No large or occlusive pulmonary artery embolus identified. Mediastinum/Nodes: No hilar or mediastinal adenopathy. The esophagus is grossly unremarkable. Bilateral hypodense thyroid nodules measure up to 3.7 cm in the inferior pole of the left thyroid gland. Lungs/Pleura: There is emphysematous changes of the lungs. Postsurgical changes of resection of the previously seen right upper lobe mass. Right lung base subpleural rounded atelectasis or scarring. No new consolidative changes. Small chronic right pleural effusion. No pneumothorax. The central airways are patent. Musculoskeletal: There is osteopenia with degenerative changes of the spine. No acute osseous pathology. Median sternotomy wires noted. CT ABDOMEN PELVIS FINDINGS No intra-abdominal free air or free fluid. Hepatobiliary: Cholecystectomy. Focal area of hypodensity along the falciform ligament in the  left lobe of the liver similar to prior CT and may represent focal fatty infiltration. A smaller subcentimeter hypodense lesion in the inferior aspect of the right lobe of the liver (series 3 image 489) is not well characterized. No intrahepatic biliary ductal dilatation. The gallbladder is surgically absent. Pancreas: Unremarkable. No pancreatic ductal dilatation or surrounding inflammatory changes. Spleen: Dystrophic calcification of the spleen as before. Adrenals/Urinary Tract: Subcentimeter left adrenal adenoma. The right adrenal gland is unremarkable. Probable 1 cm right renal inferior pole cyst. Right renal upper pole subcentimeter hypodensity is not characterized. There is no hydronephrosis on either side. There is symmetric enhancement and excretion of contrast by both kidneys. The visualized ureters and urinary bladder appear unremarkable. Stomach/Bowel: There is sigmoid diverticulosis without active inflammatory changes. No bowel obstruction or active inflammation. Normal appendix. Vascular/Lymphatic: Moderate aortoiliac atherosclerotic disease. The origins of the celiac axis, SMA, IMA are patent. No portal venous gas. There is no adenopathy. Reproductive: The prostate is grossly  unremarkable. Other: Epicardial wires noted in the left anterior abdominal wall Musculoskeletal: Degenerative changes of the spine and hips. Large anterior osteophyte at L2-L3 abutting the posterior aorta. No acute osseous pathology. IMPRESSION: 1. No acute/traumatic intrathoracic, abdominal, or pelvic pathology. 2. Nonocclusive pulmonary embolus in the subsegmental right lower lobe pulmonary artery branches likely old PE or scarring. Nonocclusive acute PE is not entirely excluded. Clinical correlation is recommended. 3. Mild cardiomegaly with dilatation of the right atrium. 4. 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 Small right pleural effusion and stable right lung base round atelectasis/scarring. 5.  Aortic Atherosclerosis (ICD10-I70.0) and Emphysema (ICD10-J43.9). These results were called by telephone at the time of interpretation on 03/26/2017 at 11:27 pm to Dr. Marda Stalker , who verbally acknowledged these results. Electronically Signed   By: Anner Crete M.D.   On: 03/26/2017 23:32   Ct Cervical Spine Wo Contrast  Result Date: 04/01/2017 CLINICAL DATA:  Found down by a friend who last saw the patient yesterday. EXAM: CT HEAD WITHOUT CONTRAST CT CERVICAL SPINE WITHOUT CONTRAST TECHNIQUE: Multidetector CT imaging of the head and cervical spine was performed following the standard protocol without intravenous contrast. Multiplanar CT image reconstructions of the cervical spine were also generated. COMPARISON:  Head CT - 03/26/2017; 03/24/2015; 09/10/2012 FINDINGS: CT HEAD FINDINGS Brain: Similar findings atrophy with sulcal prominence. Scattered periventricular hypodensities compatible microvascular ischemic disease, unchanged. Given background parenchymal abnormalities, there is no CT evidence of superimposed acute large territory infarct. No intraparenchymal or extra-axial mass or hemorrhage. Unchanged size and configuration of the ventricles and the basilar cisterns. No midline shift. Vascular: Intracranial atherosclerosis. Skull: No displaced calvarial fracture. Sinuses/Orbits: Limited visualization of the paranasal sinuses and mastoid air cells is normal. No air-fluid levels. Other: Regional soft tissues appear normal. CT CERVICAL SPINE FINDINGS Alignment: C1 to the superior endplate of T2 is imaged. There is straightening expected cervical lordosis. No anterolisthesis or retrolisthesis. The dens is normally positioned between the lateral masses of C1. Mild-to-moderate degenerative change of the atlantodental articulation. Normal atlantoaxial articulations. Skull base and vertebrae: Cervical vertebral body heights are preserved. Prevertebral soft tissues are normal. Soft tissues and spinal  canal: Regional soft tissues appear normal. No bulky cervical lymphadenopathy on this noncontrast examination. There is partial ossification the nuchal ligament. Disc levels: There is multi level flowing osteophytosis and ankylosis throughout the cervical spine. Prominent posteriorly directed disc osteophyte complexes are seen most conspicuously at C3-C4 (resulting in at least moderate spinal stenosis at this location-axial image 39, series 8; sagittal image 28, series 4), and to a lesser extent, C5-C6 and C4-C5. Upper chest: Limited visualization the lung apices demonstrates minimal biapical pleuroparenchymal thickening. Other: Note is made of a large (approximately 3.8 x 2.8 cm nodule/mass nearly replacing the left lobe of the thyroid which is noted to contain several internal calcifications. Heterogeneous appearance of the imaged portions of the right lobe of the thyroid. IMPRESSION: 1. Similar findings of atrophy and microvascular ischemic disease without superimposed acute intracranial process. 2. No fracture or static subluxation of the cervical spine. 3. Multi level flowing osteophytosis and ankylosis throughout the cervical spine compatible with DISH. 4. Prominent posteriorly directed disc osteophyte complexes are seen throughout the cervical spine, worse at C3-C4, resulting at least moderate spinal stenosis at this location. Further evaluation with dedicated nonemergent cervical spine MRI performed as indicated. 5. Incidentally noted approximately 3.8 cm nodule/mass nearly replacing the left lobe of the thyroid. Further evaluation with dedicated nonemergent thyroid ultrasound could be performed  as clinically indicated. Electronically Signed   By: Sandi Mariscal M.D.   On: 04/01/2017 13:21   Ct Abdomen Pelvis W Contrast  Result Date: 03/26/2017 CLINICAL DATA:  84 year old male with abdominal trauma. Generalized soreness secondary to fall. EXAM: CT CHEST, ABDOMEN, AND PELVIS WITH CONTRAST TECHNIQUE:  Multidetector CT imaging of the chest, abdomen and pelvis was performed following the standard protocol during bolus administration of intravenous contrast. CONTRAST:  34mL ISOVUE-300 IOPAMIDOL (ISOVUE-300) INJECTION 61% COMPARISON:  Chest radiograph dated 03/25/2017 and CT of the abdomen pelvis dated 02/06/2017 FINDINGS: CT CHEST FINDINGS Cardiovascular: Mild cardiomegaly with dilatation of the right atrium. No pericardial effusion. Postsurgical changes and epicardial wires noted. Left pectoral pacemaker device. There is moderate atherosclerotic calcification of the thoracic aorta. No aneurysmal dilatation or dissection. There is thickened appearance of the wall of the left brachiocephalic trunk. There is partially occlusive thrombus in the subsegmental right lower lobe pulmonary artery branch which may represent residual thrombus prior PE or scarring with acute nonocclusive thrombus is less likely but not excluded. Correlation with history of previous PE recommended. No large or occlusive pulmonary artery embolus identified. Mediastinum/Nodes: No hilar or mediastinal adenopathy. The esophagus is grossly unremarkable. Bilateral hypodense thyroid nodules measure up to 3.7 cm in the inferior pole of the left thyroid gland. Lungs/Pleura: There is emphysematous changes of the lungs. Postsurgical changes of resection of the previously seen right upper lobe mass. Right lung base subpleural rounded atelectasis or scarring. No new consolidative changes. Small chronic right pleural effusion. No pneumothorax. The central airways are patent. Musculoskeletal: There is osteopenia with degenerative changes of the spine. No acute osseous pathology. Median sternotomy wires noted. CT ABDOMEN PELVIS FINDINGS No intra-abdominal free air or free fluid. Hepatobiliary: Cholecystectomy. Focal area of hypodensity along the falciform ligament in the left lobe of the liver similar to prior CT and may represent focal fatty infiltration. A  smaller subcentimeter hypodense lesion in the inferior aspect of the right lobe of the liver (series 3 image 489) is not well characterized. No intrahepatic biliary ductal dilatation. The gallbladder is surgically absent. Pancreas: Unremarkable. No pancreatic ductal dilatation or surrounding inflammatory changes. Spleen: Dystrophic calcification of the spleen as before. Adrenals/Urinary Tract: Subcentimeter left adrenal adenoma. The right adrenal gland is unremarkable. Probable 1 cm right renal inferior pole cyst. Right renal upper pole subcentimeter hypodensity is not characterized. There is no hydronephrosis on either side. There is symmetric enhancement and excretion of contrast by both kidneys. The visualized ureters and urinary bladder appear unremarkable. Stomach/Bowel: There is sigmoid diverticulosis without active inflammatory changes. No bowel obstruction or active inflammation. Normal appendix. Vascular/Lymphatic: Moderate aortoiliac atherosclerotic disease. The origins of the celiac axis, SMA, IMA are patent. No portal venous gas. There is no adenopathy. Reproductive: The prostate is grossly unremarkable. Other: Epicardial wires noted in the left anterior abdominal wall Musculoskeletal: Degenerative changes of the spine and hips. Large anterior osteophyte at L2-L3 abutting the posterior aorta. No acute osseous pathology. IMPRESSION: 1. No acute/traumatic intrathoracic, abdominal, or pelvic pathology. 2. Nonocclusive pulmonary embolus in the subsegmental right lower lobe pulmonary artery branches likely old PE or scarring. Nonocclusive acute PE is not entirely excluded. Clinical correlation is recommended. 3. Mild cardiomegaly with dilatation of the right atrium. 4. 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 Small right pleural effusion and stable right lung base round atelectasis/scarring. 5. Aortic Atherosclerosis (ICD10-I70.0) and Emphysema (ICD10-J43.9). These results were called  by telephone at the time of interpretation on 03/26/2017 at 11:27  pm to Dr. Marda Stalker , who verbally acknowledged these results. Electronically Signed   By: Anner Crete M.D.   On: 03/26/2017 23:32   Dg Chest Port 1 View  Result Date: 04/09/2017 CLINICAL DATA:  Hypoxia EXAM: PORTABLE CHEST 1 VIEW COMPARISON:  04/07/2017 FINDINGS: Left AICD remains in place, unchanged. Epicardial pacer wires noted over the left heart, stable. Cardiomegaly. There is hyperinflation of the lungs compatible with COPD. Increasing right basilar airspace opacity and small right effusion concerning for pneumonia. IMPRESSION: COPD, cardiomegaly, stable. Increasing right basilar airspace opacity and small right effusion concerning for pneumonia. Electronically Signed   By: Rolm Baptise M.D.   On: 04/09/2017 07:44   Dg Abdomen Acute W/chest  Result Date: 03/23/2017 CLINICAL DATA:  Abdominal pain with nausea, vomiting, and constipation. EXAM: DG ABDOMEN ACUTE W/ 1V CHEST COMPARISON:  Chest x-ray dated March 03, 2017. CT abdomen pelvis dated February 06, 2017. Abdominal x-rays dated April 14, 2016. FINDINGS: Unchanged left chest wall AICD. Stable cardiomediastinal silhouette. Normal pulmonary vascularity. No focal consolidation, pleural effusion, or pneumothorax. No acute osseous abnormality. There is no evidence of dilated bowel loops or free intraperitoneal air. There are few air-fluid levels in the small bowel on the upright view. Air and stool are seen within the colon and rectum. Bilateral renal calculi, 8 mm on the left, 5 mm on the right. Degenerative changes of the lumbar spine and bilateral hip joints. IMPRESSION: 1. A few air-fluid levels within nondilated small bowel favor enteritis. No evidence of obstruction. 2. Bilateral renal calculi. 3.  No active cardiopulmonary disease. Electronically Signed   By: Titus Dubin M.D.   On: 03/23/2017 08:44   Dg Swallowing Func-speech Pathology  Result Date:  04/10/2017 Objective Swallowing Evaluation: Type of Study: MBS-Modified Barium Swallow Study  Patient Details Name: [name] MRN: [id number] Date of Birth: 1932/07/21 Today's Date: 04/10/2017 Time: SLP Start Time (ACUTE ONLY): 1033 -SLP Stop Time (ACUTE ONLY): 1048 SLP Time Calculation (min) (ACUTE ONLY): 15 min Past Medical History: Past Medical History: Diagnosis Date . Abnormal thyroid scan   Abnormal thyroid imaging studies from 11/09/2010, status post ultrasound guided fine needle aspiration of the dominant left inferior thyroid nodule on 12/15/2010. Cytology report showed rare follicular epithelial cells and hemosiderin laden macrophages. Marland Kitchen AICD (automatic cardioverter/defibrillator) present   a. fx lead; a. s/p lead extraction 03/02/17 . Arthritis   "all over" . Asthma  . Atrial fibrillation ([place])   on chronic Coumadin; stopped July 2013 due to subdural hematomas . CHF (congestive heart failure) (HCC)   EF 35-40% s/p most recent ICD generator change-out with Medtronic dual-chamber ICD 05/20/11 with explantation of previous abdominally-implanted device . Coronary artery disease   s/p CABG 1983 and PCI/stent 2004.  . Diabetes mellitus   diet controlled . Dyslipidemia  . Erythrocytosis  . GERD (gastroesophageal reflux disease)  . Hypertension  . Ischemic cardiomyopathy   WITH CHF . Monocytosis 04/17/2013 . Myocardial infarction ([place]) 1983; ~ 1990 . Pneumonia August 2013 . Subdural hematoma [facility]) July 2013  Anticoagulation stopped.  . SunDown syndrome  . VT (ventricular tachycardia) ([place])  Past Surgical History: Past Surgical History: Procedure Laterality Date . CHOLECYSTECTOMY   . COLONOSCOPY  07/07/2011  Procedure: COLONOSCOPY;  Surgeon: Jerene Bears, MD;  Location: [facility];  Service: Gastroenterology;  Laterality: N/A; . CORONARY ANGIOPLASTY WITH STENT PLACEMENT  2004  Tandem Cypher stents LAD . CORONARY ARTERY BYPASS GRAFT  1983  SVG-mLAD . ESOPHAGOGASTRODUODENOSCOPY  02/11/2011  Procedure:  ESOPHAGOGASTRODUODENOSCOPY (EGD);  Surgeon: Beryle Beams, MD;  Location: [facility];  Service: Endoscopy;  Laterality: N/A; . FLEXIBLE SIGMOIDOSCOPY N/A 02/08/2017  Procedure: FLEXIBLE SIGMOIDOSCOPY;  Surgeon: Irene Shipper, MD;  Location: [facility];  Service: Endoscopy;  Laterality: N/A; . ICD GENERATOR CHANGEOUT N/A 04/08/2016  Procedure: ICD Generator Changeout;  Surgeon: Evans Lance, MD;  Location: [place] [facility];  Service: Cardiovascular;  Laterality: N/A; . ICD LEAD REMOVAL N/A 03/02/2017  Procedure: ICD LEAD REMOVAL;  Surgeon: Evans Lance, MD;  Location: [place];  Service: Cardiovascular;  Laterality: N/A; . IMPLANTABLE CARDIOVERTER DEFIBRILLATOR (ICD) GENERATOR CHANGE N/A 05/20/2011  Procedure: ICD GENERATOR CHANGE;  Surgeon: Evans Lance, MD;  Medtronic secure dual-chamber ICD serial number [id number]  . KNEE ARTHROSCOPY    right; "just went in and scraped it" . LEAD INSERTION N/A 03/02/2017  Procedure: LEAD INSERTION;  Surgeon: Evans Lance, MD;  Location: [place];  Service: Cardiovascular;  Laterality: N/A; . MASS EXCISION Right 05/10/2013  Procedure: EXCISION MASS RIGHT THUMB;  Surgeon: Wynonia Sours, MD;  Location: [place];  Service: Orthopedics;  Laterality: Right; . PROXIMAL INTERPHALANGEAL FUSION (PIP) Right 05/10/2013  Procedure: DEBRIDEMENT PROXIMAL INTERPHALANGEAL FUSION (PIP);  Surgeon: Wynonia Sours, MD;  Location: [place];  Service: Orthopedics;  Laterality: Right; . TONSILLECTOMY      HPI: Joshua Wojtkiewicz Ludwickis a 84 y.o.malewith medical history significant ofafibnot on anticoagulation due to prior subdural hemorrhage,PE,hypertension, hyperlipidemia, diet-controlled diabetes, asthma, GERD, SDH, CAD,s/p ofCABGandstent placement,sCHFwith EF 35%, AICD placement, who presents with cough, chest pain and fever. T-max 102.1. Right lower lobe infiltrates. Admitted for HCAP complicated by influenza A infection. Pt has a history of mild  oropharyngeal and cervical esophageal dysphagia per MBS 02/07/14; pharyngeal residuals noted across consistencies that liquid swallow help to clear but with resultant trace aspiration of liquids (thin and nectar). Regular diet with thin liquids was recommended as pt's weight was stable and no PNA since 2013. MBS recommended to fully evaluate oropharyngeal swallow.  Subjective: pt in bed, coughing Assessment / Plan / Recommendation CHL IP CLINICAL IMPRESSIONS 04/10/2017 Clinical Impression Pt exhibits moderate pharyngeal dysphagia resulting in laryngeal penetration and aspiration with all consistencies. Impairments encompass weakness and decreased timing and initiation of protective mechanisms. Decreased pharyngeal contraction, laryngeal elevation and incomplete epiglottic deflection led to penetration to vocal cords and eventual aspiration (silent) with ineffective chin tuck posture attempted. Maximum pyriform sinus and min-mild vallecuar retention post swallow not adequately reduced with cued second swallows. Penetrated during second swallow attempts intermittently. Lingual pumping and lingual residue present. Aspiration risk too high to recommend any safe consistency at present. He has history of dysphagia now exacerbated with current illness and multiple recent hospitalizations. Recommend NPO with short term alternative nutrition and continued ST.  SLP Visit Diagnosis Dysphagia, oropharyngeal phase (R13.12) Attention and concentration deficit following -- Frontal lobe and executive function deficit following -- Impact on safety and function Moderate aspiration risk   CHL IP TREATMENT RECOMMENDATION 04/10/2017 Treatment Recommendations Therapy as outlined in treatment plan below   Prognosis 04/10/2017 Prognosis for Safe Diet Advancement (No Data) Barriers to Reach Goals -- Barriers/Prognosis Comment -- CHL IP DIET RECOMMENDATION 04/10/2017 SLP Diet Recommendations NPO Liquid Administration via -- Medication  Administration Via alternative means Compensations -- Postural Changes --   CHL IP OTHER RECOMMENDATIONS 04/10/2017 Recommended Consults -- Oral Care Recommendations Oral care QID Other Recommendations --   CHL IP FOLLOW UP RECOMMENDATIONS 04/10/2017 Follow up Recommendations Other (comment)   CHL IP  FREQUENCY AND DURATION 04/10/2017 Speech Therapy Frequency (ACUTE ONLY) min 2x/week Treatment Duration 2 weeks      CHL IP ORAL PHASE 04/10/2017 Oral Phase Impaired Oral - Pudding Teaspoon -- Oral - Pudding Cup -- Oral - Honey Teaspoon -- Oral - Honey Cup Lingual pumping;Lingual/palatal residue Oral - Nectar Teaspoon -- Oral - Nectar Cup -- Oral - Nectar Straw -- Oral - Thin Teaspoon -- Oral - Thin Cup Decreased bolus cohesion Oral - Thin Straw -- Oral - Puree Lingual pumping Oral - Mech Soft -- Oral - Regular -- Oral - Multi-Consistency -- Oral - Pill -- Oral Phase - Comment --  CHL IP PHARYNGEAL PHASE 04/10/2017 Pharyngeal Phase Impaired Pharyngeal- Pudding Teaspoon -- Pharyngeal -- Pharyngeal- Pudding Cup -- Pharyngeal -- Pharyngeal- Honey Teaspoon -- Pharyngeal -- Pharyngeal- Honey Cup Penetration/Aspiration during swallow;Pharyngeal residue - valleculae;Pharyngeal residue - pyriform;Reduced epiglottic inversion;Reduced pharyngeal peristalsis;Reduced laryngeal elevation;Reduced airway/laryngeal closure Pharyngeal Material enters airway, remains ABOVE vocal cords and not ejected out Pharyngeal- Nectar Teaspoon -- Pharyngeal -- Pharyngeal- Nectar Cup -- Pharyngeal -- Pharyngeal- Nectar Straw -- Pharyngeal -- Pharyngeal- Thin Teaspoon -- Pharyngeal -- Pharyngeal- Thin Cup Reduced pharyngeal peristalsis;Reduced epiglottic inversion;Reduced anterior laryngeal mobility;Reduced laryngeal elevation;Reduced airway/laryngeal closure;Penetration/Aspiration during swallow;Pharyngeal residue - valleculae;Pharyngeal residue - pyriform Pharyngeal Material enters airway, passes BELOW cords without attempt by patient to eject out (silent  aspiration);Material enters airway, CONTACTS cords and not ejected out Pharyngeal- Thin Straw -- Pharyngeal -- Pharyngeal- Puree Penetration/Aspiration during swallow;Pharyngeal residue - valleculae;Pharyngeal residue - pyriform;Reduced epiglottic inversion;Reduced laryngeal elevation;Reduced airway/laryngeal closure Pharyngeal Material enters airway, remains ABOVE vocal cords and not ejected out Pharyngeal- Mechanical Soft -- Pharyngeal -- Pharyngeal- Regular -- Pharyngeal -- Pharyngeal- Multi-consistency -- Pharyngeal -- Pharyngeal- Pill -- Pharyngeal -- Pharyngeal Comment --  CHL IP CERVICAL ESOPHAGEAL PHASE 04/10/2017 Cervical Esophageal Phase WFL Pudding Teaspoon -- Pudding Cup -- Honey Teaspoon -- Honey Cup -- Nectar Teaspoon -- Nectar Cup -- Nectar Straw -- Thin Teaspoon -- Thin Cup -- Thin Straw -- Puree -- Mechanical Soft -- Regular -- Multi-consistency -- Pill -- Cervical Esophageal Comment -- CHL IP GO 02/07/2014 Functional Assessment Tool Used mbs. clinical judgement Functional Limitations Swallowing Swallow Current Status [phone]) CI Swallow Goal Status (F5732) CI Swallow Discharge Status [phone]) CI Motor Speech Current Status [phone]) (None) Motor Speech Goal Status [phone]) (None) Motor Speech Goal Status (B1517) (None) Spoken Language Comprehension Current Status [phone]) (None) Spoken Language Comprehension Goal Status (P7106) (None) Spoken Language Comprehension Discharge Status [phone]) (None) Spoken Language Expression Current Status [phone]) (None) Spoken Language Expression Goal Status [phone]) (None) Spoken Language Expression Discharge Status [phone]) (None) Attention Current Status (E9937) (None) Attention Goal Status (J6967) (None) Attention Discharge Status [phone]) (None) Memory Current Status (O1751) (None) Memory Goal Status (W2585) (None) Memory Discharge Status (I7782) (None) Voice Current Status (U2353) (None) Voice Goal Status (I1443) (None) Voice Discharge Status (X5400) (None) Other  Speech-Language Pathology Functional Limitation Current Status (Q6761) (None) Other Speech-Language Pathology Functional Limitation Goal Status (P5093) (None) Other Speech-Language Pathology Functional Limitation Discharge Status [phone]) (None) [name] 04/10/2017, 2:15 PM [name] M.Ed CCC-SLP Pager [phone]              US Abdomen Limited Ruq  Result Date: 04/04/2017 CLINICAL DATA:  Elevated LFTs. EXAM: ULTRASOUND ABDOMEN LIMITED RIGHT UPPER QUADRANT COMPARISON:  Contrast-enhanced CT 03/26/2017 FINDINGS: Gallbladder: Surgically absent. Common bile duct: Diameter: 5 mm, normal. Liver: Echotexture is heterogeneous increased. There is a hyperechoic 1.9 x 1.6 x 1.4 cm lesion in the subcapsular left hepatic  lobe corresponding to lesion seen on prior CT. Portal vein is patent on color Doppler imaging with normal direction of blood flow towards the liver. Right pleural effusion is noted. IMPRESSION: 1. Heterogeneous increased hepatic echotexture suggests hepatic steatosis. 2. Hyperechoic subcapsular lesion in the left lobe of the liver measuring 1.9 cm, corresponding to lesion seen on prior CT. While this may reflect focal fatty infiltration, this is not definitively seen on more remote exams. Consider MRI characterization, however exam will only be diagnostic and only performed if patient is able tolerate breath hold technique. Alternatively, recommend follow-up ultrasound in 6 months. 3. Postcholecystectomy without biliary dilatation. Electronically Signed   By: Jeb Levering M.D.   On: 04/04/2017 02:25    Labs:  CBC: Recent Labs    04/09/17 0317 04/10/17 0927 04/13/17 1634 04/16/17 0721  WBC 9.0 10.3 9.4 6.5  HGB 11.5* 12.5* 13.8 12.2*  HCT 35.1* 37.2* 42.6 38.5*  PLT 120* 138* 217 185    COAGS: Recent Labs    11/10/16 1921 03/26/17 1720 04/18/17 1658  INR 1.10 1.48 1.11  APTT 34  --   --     BMP: Recent Labs    04/15/17 1344 04/16/17 0721 04/17/17 0633  04/18/17 0206  NA 142 143 139 135  K 3.8 4.0 4.4 4.3  CL 109 111 105 100*  CO2 24 25 23 26   GLUCOSE 138* 115* 132* 125*  BUN 11 11 12 12   CALCIUM 8.0* 8.0* 8.2* 8.3*  CREATININE 0.88 0.78 0.84 0.81  GFRNONAA >60 >60 >60 >60  GFRAA >60 >60 >60 >60    LIVER FUNCTION TESTS: Recent Labs    04/01/17 1209 04/03/17 0836 04/04/17 0449 04/07/17 0236  BILITOT 2.6* 2.6*  2.7* 1.6* 2.5*  AST 31 35  33 24 33  ALT 11* 11*  13* 11* 16*  ALKPHOS 55 55  49 46 50  PROT 6.5 6.4*  6.3* 5.9* 6.2*  ALBUMIN 3.7 3.6  3.4* 3.1* 3.2*    TUMOR MARKERS: No results for input(s): AFPTM, CEA, CA199, CHROMGRNA in the last 8760 hours.  Assessment and Plan:  Dysphagia  Will proceed with placement of image guided percutaneous gastrostomy tube.  Risks and benefits discussed with the patient and his daughter including, but not limited to the need for a barium enema during the procedure, bleeding, infection, peritonitis, or damage to adjacent structures.  All of the patient's and daughters questions were answered, patient is agreeable to proceed. Consent signed and in chart.  Thank you for this interesting consult.  I greatly enjoyed meeting SIDDH VANDEVENTER and look forward to participating in their care.  A copy of this report was sent to the requesting provider on this date.  Electronically Signed: Murrell Redden, PA-C 04/19/2017, 7:58 AM   I spent a total of 40 Minutes in face to face in clinical consultation, greater than 50% of which was counseling/coordinating care for gtube.

## 2017-04-19 NOTE — Sedation Documentation (Signed)
Patient is resting comfortably. 

## 2017-04-19 NOTE — Progress Notes (Signed)
PROGRESS NOTE    TRAPPER MEECH  KVQ:[id number] DOB: 07-16-1932 DOA: 04/07/2017 PCP: [name], MD    Brief Narrative:  [name] a 84 y.o.malewith medical history significant ofafibnot on anticoagulation due to prior subdural hemorrhage,PE,hypertension, hyperlipidemia, diet-controlled diabetes, asthma, GERD, SDH, CAD,s/p ofCABGandstent placement,sCHFwith EF 35%, AICD placement, who presents with cough, chest pain and fever of 102.1. He was found to have right lower lobe infiltrates. He was admitted for HCAP complicated by influenza A infection.  He has been treated with IV antibiotics and Tamiflu.  Throughout hospitalization, he has been intermittently confused.  Swallow evaluation revealed severe dysphagia.  He was kept n.p.o.  Eventually, cortrack was placed on 3/14 for nutrition.  Palliative care consulted for goals of care     Assessment & Plan:   Principal Problem:   Evaluation by psychiatric service required Active Problems:   Chronic atrial fibrillation (HCC)   Chronic systolic heart failure- EF 35-40%   CAD (coronary artery disease)   HCAP (healthcare-associated pneumonia)   Thrombocytopenia (HCC)   Pharyngoesophageal dysphagia   ICD (implantable cardioverter-defibrillator) in place   Sepsis ([place])   Influenza A   CKD (chronic kidney disease), stage III ([place])   Acute respiratory failure with hypoxia ([place])   Malnutrition of moderate degree   Goals of care, counseling/discussion   Palliative care by specialist   DNR (do not resuscitate)   Dysphagia  #1 acute hypoxic respiratory failure secondary to healthcare associated pneumonia/influenza A/acute on chronic aspiration Patient with clinical improvement. Patient with sats of 95% on room air.  Patient status post 5 days of empiric IV cefepime and Tamiflu.  Follow.  2.  Acute on chronic severe dysphagia Patient has been assessed by speech therapist and underwent a modified barium swallow  04/10/2017 that showed moderate aspiration risk.  N.p.o. was recommended by speech therapy.  Core track was placed 04/13/2017 for medications, nutrition and hydration however core track was dislodged.  Patient was seen by palliative care was recommended for PEG tube placement.  PEG tube placed 04/19/2017 per interventional radiology.  Consult with dietitian for nutritional assessment and initiation of tube feeds.  3.  Acute metabolic encephalopathy Likely secondary to problem #1 with probable underlying dementia.  Patient likely currently at baseline.  Follow.  4.  Chronic A. fib Continue digoxin and metoprolol for rate control.  Patient not a anticoagulation candidate due to prior history of GI bleed.  Follow.  5.  Polyneuropathy Gabapentin.  6.  Chronic systolic heart failure/EF 35%/status post AICD Continue beta-blocker.  Supportive care.  Currently euvolemic.  7.  Atypical chest pain Patient with complaints of left-sided chest pain overnight 04/17/2017.  Cardiac enzymes which were cycled were negative x3.  EKG with no ischemic changes noted unchanged from prior EKG.  10.  Goals of care Patient seen by palliative care who discussed with patient and son over the phone who are in agreement with PEG tube.  Patient status post PEG tube placement per interventional radiology 04/19/2017.  Patient will likely need palliative care to follow in the outpatient setting.  Palliative care following.   DVT prophylaxis: SCDs Code Status: DNR Family Communication: No family at bedside. Disposition Plan: Likely skilled nursing facility with palliative care following versus hospice following when medically stable.   Consultants:   Psychiatry: Dr. Mariea Clonts 04/18/2017  Palliative care [name], NP 04/14/2017  Procedures:   PEG tube placement 04/19/2017 per Dr. Vernard Gambles  Chest x-ray 04/07/2017, 04/09/2017  Modified barium swallow 04/10/2017  Antimicrobials:  IV cefepime 04/08/2017>>>>  04/11/2017     Subjective: Patient laying in bed after PEG tube placement.  Patient suctioning himself.  Patient complaining of abdominal pain around PEG tube site placement.  Denies any chest pain or shortness of breath.  Coretrack dislodged.  Objective: Vitals:   04/19/17 1101 04/19/17 1109 04/19/17 1144 04/19/17 2006  BP: (!) 137/99 (!) 159/89 112/84 119/63  Pulse: (!) 104 (!) 121 (!) 104 (!) 101  Resp: (!) 23 (!) 22 20 20   Temp:   98.4 F (36.9 C) (!) 97.5 F (36.4 C)  TempSrc:   Oral Oral  SpO2: 96% 93% 96% 95%  Weight:      Height:        Intake/Output Summary (Last 24 hours) at 04/19/2017 2045 Last data filed at 04/19/2017 1515 Gross per 24 hour  Intake 150 ml  Output 150 ml  Net 0 ml   Filed Weights   04/16/17 0406 04/17/17 0355 04/19/17 0448  Weight: 71.2 kg (157 lb) 75.2 kg (165 lb 11.2 oz) 72.6 kg (160 lb 1.6 oz)    Examination:  General exam: Appears calm and comfortable  Respiratory system: Clear to auscultation. Respiratory effort normal. Cardiovascular system: Irregularly irregular. No JVD, murmurs, rubs, gallops or clicks. No pedal edema. Gastrointestinal system: Abdomen is nondistended, soft and some tenderness to palpation around PEG site. No organomegaly or masses felt. Normal bowel sounds heard. Central nervous system: Alert and oriented. No focal neurological deficits. Extremities: Symmetric 5 x 5 power. Skin: No rashes, lesions or ulcers Psychiatry: Judgement and insight appear normal. Mood & affect appropriate.     Data Reviewed: I have personally reviewed following labs and imaging studies  CBC: Recent Labs  Lab 04/13/17 1634 04/16/17 0721  WBC 9.4 6.5  HGB 13.8 12.2*  HCT 42.6 38.5*  MCV 94.0 95.3  PLT 217 026   Basic Metabolic Panel: Recent Labs  Lab 04/15/17 1344 04/16/17 0721 04/17/17 0633 04/18/17 0206 04/19/17 0642  NA 142 143 139 135 136  K 3.8 4.0 4.4 4.3 4.1  CL 109 111 105 100* 101  CO2 24 25 23 26 24   GLUCOSE  138* 115* 132* 125* 126*  BUN 11 11 12 12 11   CREATININE 0.88 0.78 0.84 0.81 0.84  CALCIUM 8.0* 8.0* 8.2* 8.3* 8.5*  MG 1.9 1.7 1.5* 1.7 1.5*   GFR: Estimated Creatinine Clearance: 69.6 mL/min (by C-G formula based on SCr of 0.84 mg/dL). Liver Function Tests: No results for input(s): AST, ALT, ALKPHOS, BILITOT, PROT, ALBUMIN in the last 168 hours. No results for input(s): LIPASE, AMYLASE in the last 168 hours. No results for input(s): AMMONIA in the last 168 hours. Coagulation Profile: Recent Labs  Lab 04/18/17 1658  INR 1.11   Cardiac Enzymes: Recent Labs  Lab 04/17/17 2035 04/18/17 0206 04/18/17 0744  TROPONINI <0.03 <0.03 <0.03   BNP (last 3 results) No results for input(s): PROBNP in the last 8760 hours. HbA1C: No results for input(s): HGBA1C in the last 72 hours. CBG: Recent Labs  Lab 04/19/17 0530 04/19/17 0744 04/19/17 1147 04/19/17 1559 04/19/17 2003  GLUCAP 123* 115* 147* 139* 111*   Lipid Profile: No results for input(s): CHOL, HDL, LDLCALC, TRIG, CHOLHDL, LDLDIRECT in the last 72 hours. Thyroid Function Tests: No results for input(s): TSH, T4TOTAL, FREET4, T3FREE, THYROIDAB in the last 72 hours. Anemia Panel: No results for input(s): VITAMINB12, FOLATE, FERRITIN, TIBC, IRON, RETICCTPCT in the last 72 hours. Sepsis Labs: No results for input(s): PROCALCITON, LATICACIDVEN in the last  168 hours.  No results found for this or any previous visit (from the past 240 hour(s)).       Radiology Studies: Ir Gastrostomy Tube Mod Sed  Result Date: 04/19/2017 CLINICAL DATA:  Dysphagia, needs enteral feeding support EXAM: PERC PLACEMENT GASTROSTOMY FLUOROSCOPY TIME:  3 minutes 24 seconds; 13 mGy TECHNIQUE: The procedure, risks, benefits, and alternatives were explained to the patient. Questions regarding the procedure were encouraged and answered. The patient understands and consents to the procedure. As antibiotic prophylaxis, cefazolin 2 g was ordered  pre-procedure and administered intravenously within one hour of incision. A safe percutaneous approach was confirmed on recent CT abdomen. A 5 French angiographic catheter was placed as orogastric tube. The upper abdomen was prepped with Betadine, draped in usual sterile fashion, and infiltrated locally with 1% lidocaine. Intravenous Fentanyl and Versed were administered as conscious sedation during continuous monitoring of the patient's level of consciousness and physiological / cardiorespiratory status by the radiology RN, with a total moderate sedation time of 10 minutes. Stomach was insufflated using air through the orogastric tube. An 48 French needle was advanced percutaneously into the gastric lumen under fluoroscopy. Gas could be aspirated and a small contrast injection confirmed intraluminal spread. The needle was exchanged over a guidewire for a 9 [place] vascular sheath, through which the snare device was advanced and used to snare a guidewire passed through the orogastric tube. This was withdrawn, and the snare attached to the 20 French pull-through gastrostomy tube, which was advanced antegrade, positioned with the internal bumper securing the anterior gastric wall to the anterior abdominal wall. Small contrast injection confirms appropriate positioning. The external bumper was applied and the catheter was flushed. COMPLICATIONS: COMPLICATIONS none IMPRESSION: 1. Technically successful 20 French pull-through gastrostomy placement under fluoroscopy. Electronically Signed   By: Lucrezia Europe M.D.   On: 04/19/2017 11:12        Scheduled Meds: . digoxin  0.125 mg Oral Daily  . docusate  50 mg Per Tube BID  . [START ON 04/20/2017] feeding supplement (JEVITY 1.5 CAL/FIBER)  360 mL Per Tube QID  . fentaNYL      . [START ON 04/20/2017] free water  180 mL Per Tube QID  . glucagon (human recombinant)      . guaiFENesin-dextromethorphan  15 mL Per Tube TID AC & HS  . lidocaine      . metoprolol tartrate   12.5 mg Per Tube BID  . midazolam      .  morphine injection  2 mg Intravenous Once  . sennosides  5 mL Per Tube BID   Continuous Infusions: . ceFAZolin       LOS: 12 days    Time spent: 35 mins    Irine Seal, MD [facility] Pager [phone]  If 7PM-7AM, please contact night-coverage [email or web address] Password [facility] 04/19/2017, 8:45 PM

## 2017-04-19 NOTE — Progress Notes (Signed)
Pt awake for report, [employer] rep at bedside to turn off ICD simultaneously. Per report cortrak got snagged on something last night and placement is currently questionable; therefore, this nurse will not be administering medications this morning until verification is received.

## 2017-04-19 NOTE — Progress Notes (Signed)
Tube irrigation supplies available now, per order; pt is not to receive any pills per tube, crushed or otherwise.  Dr Grandville Silos informed of this order detail, advised to switch any critical pills to liquid form so they may be given.

## 2017-04-19 NOTE — Sedation Documentation (Signed)
Pt tolerating procedure well.

## 2017-04-19 NOTE — Progress Notes (Signed)
Abdominal binder requested; pt returned to floor without.

## 2017-04-20 DIAGNOSIS — R627 Adult failure to thrive: Secondary | ICD-10-CM

## 2017-04-20 LAB — GLUCOSE, CAPILLARY
GLUCOSE-CAPILLARY: 117 mg/dL — AB (ref 65–99)
GLUCOSE-CAPILLARY: 119 mg/dL — AB (ref 65–99)
GLUCOSE-CAPILLARY: 125 mg/dL — AB (ref 65–99)
GLUCOSE-CAPILLARY: 136 mg/dL — AB (ref 65–99)
Glucose-Capillary: 163 mg/dL — ABNORMAL HIGH (ref 65–99)
Glucose-Capillary: 127 mg/dL — ABNORMAL HIGH (ref 65–99)

## 2017-04-20 LAB — BASIC METABOLIC PANEL
ANION GAP: 10 (ref 5–15)
BUN: 18 mg/dL (ref 6–20)
CALCIUM: 8.4 mg/dL — AB (ref 8.9–10.3)
CO2: 25 mmol/L (ref 22–32)
CREATININE: 0.95 mg/dL (ref 0.61–1.24)
Chloride: 101 mmol/L (ref 101–111)
GFR calc Af Amer: >60 mL/min (ref >60)
GFR calc non Af Amer: >60 mL/min (ref >60)
Glucose, Bld: 110 mg/dL — ABNORMAL HIGH (ref 65–99)
Potassium: 4.7 mmol/L (ref 3.5–5.1)
Sodium: 136 mmol/L (ref 135–145)

## 2017-04-20 LAB — CBC
HCT: 38 % — ABNORMAL LOW (ref 39.0–52.0)
Hemoglobin: 12.2 g/dL — ABNORMAL LOW (ref 13.0–17.0)
MCH: 30 pg (ref 26.0–34.0)
MCHC: 32.1 g/dL (ref 30.0–36.0)
MCV: 93.6 fL (ref 78.0–100.0)
PLATELETS: 209 10*3/uL (ref 150–400)
RBC: 4.06 MIL/uL — AB (ref 4.22–5.81)
RDW: 16.6 % — ABNORMAL HIGH (ref 11.5–15.5)
WBC: 12.4 10*3/uL — ABNORMAL HIGH (ref 4.0–10.5)

## 2017-04-20 LAB — MAGNESIUM: Magnesium: 2.7 mg/dL — ABNORMAL HIGH (ref 1.7–2.4)

## 2017-04-20 MED ORDER — METOPROLOL TARTRATE 25 MG/10 ML ORAL SUSPENSION
25.0000 mg | Freq: Two times a day (BID) | ORAL | Status: DC
  Administered 2017-04-20 – 2017-04-26 (×12): 25 mg
  Filled 2017-04-20 (×13): qty 10

## 2017-04-20 MED ORDER — METOPROLOL TARTRATE 25 MG/10 ML ORAL SUSPENSION
12.5000 mg | Freq: Once | ORAL | Status: AC
  Administered 2017-04-20: 12.5 mg
  Filled 2017-04-20: qty 5

## 2017-04-20 NOTE — Clinical Social Work Note (Addendum)
CSW updated FL2 noting peg placement and sent to local SNF's. Will provide list of bed offers today once obtained.  Dayton Scrape, [place]  12:39 pm CSW provided patient with bed offers. Discussed locations and star ratings. Patient has chosen [facility] (Now called [facility]). Clinical liaison notified. CSW notified her that once patient discharges from the facility, he is [place] with [facility].  Dayton Scrape, [place]

## 2017-04-20 NOTE — Progress Notes (Signed)
Referring Physician(s): Dr. Maylene Roes  Supervising Physician: Arne Cleveland  Patient Status:  [facility] - In-pt  Chief Complaint: Dysphagia  Subjective: Mild nausea today.  No complaints pertaining to G-tube.   Allergies: Tamsulosin; Celebrex [celecoxib]; Dronedarone; Esomeprazole magnesium; Digoxin; Hydrocodone-acetaminophen; and Protonix [pantoprazole sodium]  Medications: Prior to Admission medications   Medication Sig Start Date End Date Taking? Authorizing Provider  acetaminophen (TYLENOL) 325 MG tablet Take 2 tablets (650 mg total) by mouth every 6 (six) hours as needed for mild pain (or Fever >/= 101). 04/03/17  Yes Georgette Shell, MD  ADVAIR DISKUS 250-50 MCG/DOSE AEPB Inhale 1 puff into the lungs 2 (two) times daily.  11/13/14  Yes [provider]  calcium carbonate (TUMS - DOSED IN MG ELEMENTAL CALCIUM) 500 MG chewable tablet Chew 500 mg by mouth daily as needed for heartburn.    Yes [provider]  digoxin (LANOXIN) 0.125 MG tablet Take 1 tablet (0.125 mg total) by mouth daily. 02/10/17  Yes Short, Noah Delaine, MD  fluticasone (FLONASE) 50 MCG/ACT nasal spray Place 2 sprays into both nostrils daily. Patient taking differently: Place 1 spray into both nostrils daily as needed for allergies.  01/04/17  Yes [name], MD  furosemide (LASIX) 40 MG tablet Take 0.5 tablets (20 mg total) by mouth daily. May take an extra tab daily as needed for swelling 04/03/17  Yes Georgette Shell, MD  gabapentin (NEURONTIN) 100 MG capsule TAKE 1 CAPSULE (100MG ) TO 3 CAPSULES (300MG ) EACH NIGHT AS NEEDED FOR MUSCLE PAINS Patient taking differently: Take 100-300 mg by mouth at bedtime as needed (for mucle pains).  10/05/16  Yes [name], MD  lisinopril (PRINIVIL,ZESTRIL) 10 MG tablet Take 0.5 tablets (5 mg total) by mouth daily. 04/03/17  Yes Georgette Shell, MD  loratadine (CLARITIN) 10 MG tablet Take 10 mg by mouth daily as needed for allergies.    Yes [provider]  metoprolol succinate (TOPROL-XL) 25 MG 24 hr tablet Take 1 tablet (25 mg total) by mouth daily. 02/10/17  Yes Short, Noah Delaine, MD  nitroGLYCERIN (NITROSTAT) 0.4 MG SL tablet PLACE ONE UNDER TONGUE FOR CHEST PAIN. Patient taking differently: Place 0.4 mg under tongue as needed for chest pain 11/22/16  Yes [name], Peter M, MD  phenol (CHLORASEPTIC) 1.4 % LIQD Use as directed 1 spray in the mouth or throat as needed for throat irritation / pain. 04/04/17  Yes Georgette Shell, MD  potassium chloride (K-DUR,KLOR-CON) 10 MEQ tablet Take 1 tablet (10 mEq total) by mouth daily. 01/09/13  Yes [name], Peter M, MD  traMADol (ULTRAM) 50 MG tablet TAKE 2 TABLETS BY MOUTH EVERY 8 HOURS AS NEEDED FOR PAIN Patient taking differently: Take 100 mg by mouth every 8 (eight) hours as needed for moderate pain.  03/24/17  Yes [name], MD  RESTASIS 0.05 % ophthalmic emulsion INSTILL 1 DROP INTO BOTH EYES TWICE A DAY Patient not taking: Reported on 04/01/2017 11/17/16   [name], MD     Vital Signs: BP 137/73 (BP Location: Right Arm)   Pulse 81   Temp 97.9 F (36.6 C) (Oral)   Resp 18   Ht 6\' 1"  (1.854 m)   Wt 159 lb 12.8 oz (72.5 kg)   SpO2 94%   BMI 21.08 kg/m   Physical Exam  NAD, alert Abdomen:  Percutaneous gastrostomy in place.  Insertion site intact.  No erythema or warmth.  No active bleeding.   Imaging: Ir Gastrostomy Tube Mod  Sed  Result Date: 04/19/2017 CLINICAL DATA:  Dysphagia, needs enteral feeding support EXAM: PERC PLACEMENT GASTROSTOMY FLUOROSCOPY TIME:  3 minutes 24 seconds; 13 mGy TECHNIQUE: The procedure, risks, benefits, and alternatives were explained to the patient. Questions regarding the procedure were encouraged and answered. The patient understands and consents to the procedure. As antibiotic prophylaxis, cefazolin 2 g was ordered pre-procedure and administered intravenously within one hour of incision. A safe percutaneous approach was confirmed on  recent CT abdomen. A 5 French angiographic catheter was placed as orogastric tube. The upper abdomen was prepped with Betadine, draped in usual sterile fashion, and infiltrated locally with 1% lidocaine. Intravenous Fentanyl and Versed were administered as conscious sedation during continuous monitoring of the patient's level of consciousness and physiological / cardiorespiratory status by the radiology RN, with a total moderate sedation time of 10 minutes. Stomach was insufflated using air through the orogastric tube. An 74 French needle was advanced percutaneously into the gastric lumen under fluoroscopy. Gas could be aspirated and a small contrast injection confirmed intraluminal spread. The needle was exchanged over a guidewire for a 9 [place] vascular sheath, through which the snare device was advanced and used to snare a guidewire passed through the orogastric tube. This was withdrawn, and the snare attached to the 20 French pull-through gastrostomy tube, which was advanced antegrade, positioned with the internal bumper securing the anterior gastric wall to the anterior abdominal wall. Small contrast injection confirms appropriate positioning. The external bumper was applied and the catheter was flushed. COMPLICATIONS: COMPLICATIONS none IMPRESSION: 1. Technically successful 20 French pull-through gastrostomy placement under fluoroscopy. Electronically Signed   By: Lucrezia Europe M.D.   On: 04/19/2017 11:12    Labs:  CBC: Recent Labs    04/10/17 0927 04/13/17 1634 04/16/17 0721 04/20/17 0305  WBC 10.3 9.4 6.5 12.4*  HGB 12.5* 13.8 12.2* 12.2*  HCT 37.2* 42.6 38.5* 38.0*  PLT 138* 217 185 209    COAGS: Recent Labs    11/10/16 1921 03/26/17 1720 04/18/17 1658  INR 1.10 1.48 1.11  APTT 34  --   --     BMP: Recent Labs    04/17/17 0633 04/18/17 0206 04/19/17 0642 04/20/17 0305  NA 139 135 136 136  K 4.4 4.3 4.1 4.7  CL 105 100* 101 101  CO2 23 26 24 25   GLUCOSE 132* 125* 126* 110*   BUN 12 12 11 18   CALCIUM 8.2* 8.3* 8.5* 8.4*  CREATININE 0.84 0.81 0.84 0.95  GFRNONAA >60 >60 >60 >60  GFRAA >60 >60 >60 >60    LIVER FUNCTION TESTS: Recent Labs    04/01/17 1209 04/03/17 0836 04/04/17 0449 04/07/17 0236  BILITOT 2.6* 2.6*  2.7* 1.6* 2.5*  AST 31 35  33 24 33  ALT 11* 11*  13* 11* 16*  ALKPHOS 55 55  49 46 50  PROT 6.5 6.4*  6.3* 5.9* 6.2*  ALBUMIN 3.7 3.6  3.4* 3.1* 3.2*    Assessment and Plan: Dysphagia s/p gastrostomy tube placement 3/20 G-tube in place today without issue.  Patient complains of mild nausea, however no vomiting.  G-tube ready for use.  RN aware.  IR available if needed.   Electronically Signed: Docia Barrier, PA 04/20/2017, 2:01 PM   I spent a total of 15 Minutes at the the patient's bedside AND on the patient's hospital floor or unit, greater than 50% of which was counseling/coordinating care for dysphagia.

## 2017-04-20 NOTE — NC FL2 (Signed)
Onset LEVEL OF CARE SCREENING TOOL     IDENTIFICATION  Patient Name: [name] Birthdate: [birth year] Sex: male Admission Date (Current Location): 04/07/2017  [facility] and [place] Number:  Herbalist and Address:  [facility] [place], [place] [address] [place], [place] [address]      Provider Number: [id number]  Attending Physician Name and Address:  Eugenie Filler, MD  Relative Name and Phone Number:       Current Level of Care: Hospital Recommended Level of Care: [place] Prior Approval Number:    Date Approved/Denied:   PASRR Number: [id number]  Discharge Plan: SNF    Current Diagnoses: Patient Active Problem List   Diagnosis Date Noted  . Dysphagia   . DNR (do not resuscitate)   . Goals of care, counseling/discussion   . Palliative care by specialist   . Malnutrition of moderate degree 04/13/2017  . Sepsis ([place]) 04/07/2017  . Influenza A 04/07/2017  . CKD (chronic kidney disease), stage III ([place]) 04/07/2017  . Acute respiratory failure with hypoxia ([place]) 04/07/2017  . Syncope 04/01/2017  . Acute kidney injury ([place]) 04/01/2017  . Thyroid nodule 04/01/2017  . Evaluation by psychiatric service required   . Chronic pulmonary embolism ([place]) 03/27/2017  . Acute confusion 03/04/2017  . Failure of implantable cardioverter-defibrillator (ICD) lead 03/02/2017  . Rectal ulcer   . Rectal bleeding   . Proctitis 02/07/2017  . Abdominal pain 02/06/2017  . Acute radiation proctitis 02/06/2017  . Failure of implantable cardioverter-defibrillator lead 11/10/2016  . ICD (implantable cardioverter-defibrillator) in place 04/08/2016  . Pharyngoesophageal dysphagia 12/31/2013  . Gastroesophageal reflux disease without esophagitis 12/31/2013  . Hypotension 09/23/2011  . HCAP (healthcare-associated pneumonia) 09/23/2011  . SIRS (systemic inflammatory response syndrome) ([place]) 09/23/2011  . Thrombocytopenia ([place])  09/23/2011  . History of subdural hemorrhage 09/08/2011  . Ataxia 08/17/2011  . Dehydration 08/17/2011  . Atrial fibrillation with controlled ventricular response ([place]) 08/13/2011  . Colon polyp 07/07/2011  . Angiodysplasia of colon 07/07/2011  . GERD (gastroesophageal reflux disease) 05/31/2011  . Anemia, iron deficiency 12/29/2010  . Chronic anticoagulation discontinued July 2013 after developed subdural hematoma 09/22/2010  . Bradycardia 09/22/2010  . CAD (coronary artery disease) 06/25/2010  . ventricular tachycardia 12/30/2009  . Chronic atrial fibrillation ([place]) 12/30/2009  . Chronic systolic heart failure- EF 35-40% 12/30/2009  . Automatic implantable cardioverter-defibrillator in situ 12/30/2009    Orientation RESPIRATION BLADDER Height & Weight     Self, Time, Situation, Place   Room air Continent Weight: 159 lb 12.8 oz (72.5 kg) Height:  6\' 1"  (185.4 cm)  BEHAVIORAL SYMPTOMS/MOOD NEUROLOGICAL BOWEL NUTRITION STATUS  Other (Comment)(Anxiety) (None) Continent Feeding tube(Peg placed 3/20.)  AMBULATORY STATUS COMMUNICATION OF NEEDS Skin   Limited Assist Verbally Skin abrasions, Bruising, Other (Comment)(MASD, Skin tear.)                       Personal Care Assistance Level of Assistance  Bathing, Dressing Bathing Assistance: (Supervision)   Dressing Assistance: (Supervision)     Functional Limitations Info  Sight, Hearing, Speech Sight Info: Adequate Hearing Info: Adequate Speech Info: Adequate    SPECIAL CARE FACTORS FREQUENCY  PT (By licensed PT), OT (By licensed OT), Speech therapy     PT Frequency: 5 x week OT Frequency: 5 x week     Speech Therapy Frequency: 5 x week      Contractures Contractures Info: Not present    Additional  Factors Info  Code Status, Allergies Code Status Info: DNR Allergies Info: Tamsulosin, Celebrex (Celecoxib), Dronedarone, Esomeprazole Magnesium, Digoxin, Hydrocodone-acetaminophen, Protonix (Pantoprazole Sodium)            Current Medications (04/20/2017):  This is the current hospital active medication list Current Facility-Administered Medications  Medication Dose Route Frequency Provider Last Rate Last Dose  . acetaminophen (TYLENOL) suppository 650 mg  650 mg Rectal Q6H PRN Gardiner Barefoot, NP      . calcium carbonate (TUMS - dosed in mg elemental calcium) chewable tablet 500 mg  500 mg Oral Daily PRN Ivor Costa, MD      . digoxin Fonnie Birkenhead) tablet 0.125 mg  0.125 mg Oral Daily Ivor Costa, MD   0.125 mg at 04/18/17 0810  . docusate (COLACE) 50 MG/5ML liquid 50 mg  50 mg Per Tube BID Irene Pap N, DO   50 mg at 04/17/17 2324  . feeding supplement (JEVITY 1.5 CAL/FIBER) liquid 360 mL  360 mL Per Tube QID Eugenie Filler, MD      . fluticasone (FLONASE) 50 MCG/ACT nasal spray 1 spray  1 spray Each Nare Daily PRN Ivor Costa, MD      . free water 180 mL  180 mL Per Tube QID Eugenie Filler, MD      . gabapentin (NEURONTIN) capsule 100-300 mg  100-300 mg Oral QHS PRN Ivor Costa, MD      . guaiFENesin-dextromethorphan [facility] DM) 100-10 MG/5ML syrup 15 mL  15 mL Per Tube TID AC & HS Dessa Phi, DO   15 mL at 04/20/17 0600  . ipratropium-albuterol (DUONEB) 0.5-2.5 (3) MG/3ML nebulizer solution 3 mL  3 mL Nebulization Q4H PRN Irene Pap N, DO      . ketorolac (TORADOL) 30 MG/ML injection 30 mg  30 mg Intravenous Q6H PRN Eugenie Filler, MD   30 mg at 04/19/17 1320  . loratadine (CLARITIN) tablet 10 mg  10 mg Oral Daily PRN Ivor Costa, MD      . LORazepam (ATIVAN) injection 0.5 mg  0.5 mg Intravenous Daily PRN Bodenheimer, Charles A, NP   0.5 mg at 04/18/17 2247  . menthol-cetylpyridinium (CEPACOL) lozenge 3 mg  1 lozenge Oral PRN Bodenheimer, Charles A, NP      . metoprolol tartrate (LOPRESSOR) 25 mg/10 mL oral suspension 12.5 mg  12.5 mg Per Tube BID Dessa Phi, DO   12.5 mg at 04/19/17 2140  . morphine 2 MG/ML injection 1 mg  1 mg Intravenous Q3H PRN Gardiner Barefoot, NP   1  mg at 04/11/17 0031  . morphine 2 MG/ML injection 2 mg  2 mg Intravenous Once Bodenheimer, Charles A, NP      . nitroGLYCERIN (NITROSTAT) SL tablet 0.4 mg  0.4 mg Sublingual Q5 min PRN Ivor Costa, MD   0.4 mg at 04/17/17 2043  . ondansetron (ZOFRAN) injection 4 mg  4 mg Intravenous Q4H PRN Arne Cleveland, MD      . phenol (CHLORASEPTIC) mouth spray 1 spray  1 spray Mouth/Throat PRN Ivor Costa, MD   1 spray at 04/18/17 0125  . sennosides (SENOKOT) 8.8 MG/5ML syrup 5 mL  5 mL Per Tube BID Irene Pap N, DO   5 mL at 04/17/17 2325     Discharge Medications: Please see discharge summary for a list of discharge medications.  Relevant Imaging Results:  Relevant Lab Results:   Additional Information SS#: [id number]  Candie Chroman, LCSW

## 2017-04-20 NOTE — Progress Notes (Signed)
PROGRESS NOTE    [name]  EGB:[id number] DOB: 05-Mar-1932 DOA: 04/07/2017 PCP: [name], MD    Brief Narrative:  [name] a 84 y.o.malewith medical history significant ofafibnot on anticoagulation due to prior subdural hemorrhage,PE,hypertension, hyperlipidemia, diet-controlled diabetes, asthma, GERD, SDH, CAD,s/p ofCABGandstent placement,sCHFwith EF 35%, AICD placement, who presents with cough, chest pain and fever of 102.1. He was found to have right lower lobe infiltrates. He was admitted for HCAP complicated by influenza A infection.  He has been treated with IV antibiotics and Tamiflu.  Throughout hospitalization, he has been intermittently confused.  Swallow evaluation revealed severe dysphagia.  He was kept n.p.o.  Eventually, cortrack was placed on 3/14 for nutrition.  Palliative care consulted for goals of care     Assessment & Plan:   Principal Problem:   Evaluation by psychiatric service required Active Problems:   Chronic atrial fibrillation (HCC)   Chronic systolic heart failure- EF 35-40%   CAD (coronary artery disease)   HCAP (healthcare-associated pneumonia)   Thrombocytopenia (HCC)   Pharyngoesophageal dysphagia   ICD (implantable cardioverter-defibrillator) in place   Sepsis ([name])   Influenza A   CKD (chronic kidney disease), stage III ([name])   Acute respiratory failure with hypoxia ([name])   Malnutrition of moderate degree   Goals of care, counseling/discussion   Palliative care by specialist   DNR (do not resuscitate)   Dysphagia  #1 acute hypoxic respiratory failure secondary to healthcare associated pneumonia/influenza A/acute on chronic aspiration Patient with clinical improvement. Patient with sats of 94% on room air.  Patient status post 5 days of empiric IV cefepime and Tamiflu.  Currently stable.  Follow.  2.  Acute on chronic severe dysphagia Patient has been assessed by speech therapist and underwent a modified  barium swallow 04/10/2017 that showed moderate aspiration risk.  N.p.o. was recommended by speech therapy.  Coretrack was placed 04/13/2017 for medications, nutrition and hydration however core track was dislodged.  Patient was seen by palliative care was recommended for PEG tube placement.  PEG tube placed 04/19/2017 per interventional radiology.  Dietitian consulted and patient started on tube feeds.  3.  Acute metabolic encephalopathy Likely secondary to problem #1 with probable underlying dementia.  Patient likely currently at baseline.  Follow.  4.  Chronic A. fib Continue digoxin and metoprolol for rate control.  Patient not a anticoagulation candidate due to prior history of GI bleed.  Follow.  5.  Polyneuropathy Continue current regimen of gabapentin.  6.  Chronic systolic heart failure/EF 35%/status post AICD Patient is currently euvolemic.  Continue beta-blocker.  Supportive care.   7.  Atypical chest pain Patient with complaints of left-sided chest pain overnight of 04/17/2017.  Cardiac enzymes were negative x3.  EKG with no ischemic changes noted.  No further chest pain.   10.  Goals of care Patient seen by palliative care who discussed with patient and son over the phone who are in agreement with PEG tube.  Patient status post PEG tube placement per interventional radiology 04/19/2017.  Patient will likely need palliative care vs hospice to follow in the outpatient setting.  Palliative care following.   DVT prophylaxis: SCDs Code Status: DNR Family Communication: No family at bedside. Disposition Plan: Likely skilled nursing facility with palliative care following versus hospice following when medically stable.   Consultants:   Psychiatry: Dr. Mariea Clonts 04/18/2017  Palliative care [name], NP 04/14/2017  Procedures:   PEG tube placement 04/19/2017 per Dr. Vernard Gambles  Chest x-ray 04/07/2017,  04/09/2017  Modified barium swallow 04/10/2017  Antimicrobials:   IV cefepime  04/08/2017>>>> 04/11/2017     Subjective: Patient complaining of some pain around PEG tube site placement.  No chest pain.  No shortness of breath.   Objective: Vitals:   04/19/17 2006 04/20/17 0339 04/20/17 0425 04/20/17 1000  BP: 119/63  127/70 135/66  Pulse: (!) 101  98 (!) 112  Resp: 20  20   Temp: (!) 97.5 F (36.4 C)  97.8 F (36.6 C)   TempSrc: Oral  Oral   SpO2: 95%  93%   Weight:  72.5 kg (159 lb 12.8 oz)    Height:        Intake/Output Summary (Last 24 hours) at 04/20/2017 1214 Last data filed at 04/20/2017 1000 Gross per 24 hour  Intake 410 ml  Output 175 ml  Net 235 ml   Filed Weights   04/17/17 0355 04/19/17 0448 04/20/17 0339  Weight: 75.2 kg (165 lb 11.2 oz) 72.6 kg (160 lb 1.6 oz) 72.5 kg (159 lb 12.8 oz)    Examination:  General exam: NAD. Respiratory system: Lungs clear to auscultation bilaterally no wheezes, no crackles, no rhonchi.  Respiratory effort normal. Cardiovascular system: Irregularly irregular. No JVD, murmurs, rubs, gallops or clicks. No pedal edema. Gastrointestinal system: Abdomen is soft, nondistended, positive bowel sounds, some tenderness to palpation around the exam.  No guarding.  No rebound.  Central nervous system: Alert and oriented. No focal neurological deficits. Extremities: Symmetric 5 x 5 power. Skin: No rashes, lesions or ulcers Psychiatry: Judgement and insight appear normal. Mood & affect appropriate.     Data Reviewed: I have personally reviewed following labs and imaging studies  CBC: Recent Labs  Lab 04/13/17 1634 04/16/17 0721 04/20/17 0305  WBC 9.4 6.5 12.4*  HGB 13.8 12.2* 12.2*  HCT 42.6 38.5* 38.0*  MCV 94.0 95.3 93.6  PLT 217 185 466   Basic Metabolic Panel: Recent Labs  Lab 04/16/17 0721 04/17/17 0633 04/18/17 0206 04/19/17 0642 04/20/17 0305  NA 143 139 135 136 136  K 4.0 4.4 4.3 4.1 4.7  CL 111 105 100* 101 101  CO2 25 23 26 24 25   GLUCOSE 115* 132* 125* 126* 110*  BUN 11 12 12 11 18     CREATININE 0.78 0.84 0.81 0.84 0.95  CALCIUM 8.0* 8.2* 8.3* 8.5* 8.4*  MG 1.7 1.5* 1.7 1.5* 2.7*   GFR: Estimated Creatinine Clearance: 61.5 mL/min (by C-G formula based on SCr of 0.95 mg/dL). Liver Function Tests: No results for input(s): AST, ALT, ALKPHOS, BILITOT, PROT, ALBUMIN in the last 168 hours. No results for input(s): LIPASE, AMYLASE in the last 168 hours. No results for input(s): AMMONIA in the last 168 hours. Coagulation Profile: Recent Labs  Lab 04/18/17 1658  INR 1.11   Cardiac Enzymes: Recent Labs  Lab 04/17/17 2035 04/18/17 0206 04/18/17 0744  TROPONINI <0.03 <0.03 <0.03   BNP (last 3 results) No results for input(s): PROBNP in the last 8760 hours. HbA1C: No results for input(s): HGBA1C in the last 72 hours. CBG: Recent Labs  Lab 04/19/17 2003 04/20/17 0011 04/20/17 0424 04/20/17 0729 04/20/17 1131  GLUCAP 111* 119* 117* 125* 136*   Lipid Profile: No results for input(s): CHOL, HDL, LDLCALC, TRIG, CHOLHDL, LDLDIRECT in the last 72 hours. Thyroid Function Tests: No results for input(s): TSH, T4TOTAL, FREET4, T3FREE, THYROIDAB in the last 72 hours. Anemia Panel: No results for input(s): VITAMINB12, FOLATE, FERRITIN, TIBC, IRON, RETICCTPCT in the last 72 hours. Sepsis Labs:  No results for input(s): PROCALCITON, LATICACIDVEN in the last 168 hours.  No results found for this or any previous visit (from the past 240 hour(s)).       Radiology Studies: Ir Gastrostomy Tube Mod Sed  Result Date: 04/19/2017 CLINICAL DATA:  Dysphagia, needs enteral feeding support EXAM: PERC PLACEMENT GASTROSTOMY FLUOROSCOPY TIME:  3 minutes 24 seconds; 13 mGy TECHNIQUE: The procedure, risks, benefits, and alternatives were explained to the patient. Questions regarding the procedure were encouraged and answered. The patient understands and consents to the procedure. As antibiotic prophylaxis, cefazolin 2 g was ordered pre-procedure and administered intravenously within  one hour of incision. A safe percutaneous approach was confirmed on recent CT abdomen. A 5 French angiographic catheter was placed as orogastric tube. The upper abdomen was prepped with Betadine, draped in usual sterile fashion, and infiltrated locally with 1% lidocaine. Intravenous Fentanyl and Versed were administered as conscious sedation during continuous monitoring of the patient's level of consciousness and physiological / cardiorespiratory status by the radiology RN, with a total moderate sedation time of 10 minutes. Stomach was insufflated using air through the orogastric tube. An 60 French needle was advanced percutaneously into the gastric lumen under fluoroscopy. Gas could be aspirated and a small contrast injection confirmed intraluminal spread. The needle was exchanged over a guidewire for a 9 [place] vascular sheath, through which the snare device was advanced and used to snare a guidewire passed through the orogastric tube. This was withdrawn, and the snare attached to the 20 French pull-through gastrostomy tube, which was advanced antegrade, positioned with the internal bumper securing the anterior gastric wall to the anterior abdominal wall. Small contrast injection confirms appropriate positioning. The external bumper was applied and the catheter was flushed. COMPLICATIONS: COMPLICATIONS none IMPRESSION: 1. Technically successful 20 French pull-through gastrostomy placement under fluoroscopy. Electronically Signed   By: Lucrezia Europe M.D.   On: 04/19/2017 11:12        Scheduled Meds: . digoxin  0.125 mg Oral Daily  . docusate  50 mg Per Tube BID  . feeding supplement (JEVITY 1.5 CAL/FIBER)  360 mL Per Tube QID  . free water  180 mL Per Tube QID  . guaiFENesin-dextromethorphan  15 mL Per Tube TID AC & HS  . metoprolol tartrate  12.5 mg Per Tube Once  . metoprolol tartrate  25 mg Per Tube BID  .  morphine injection  2 mg Intravenous Once  . sennosides  5 mL Per Tube BID   Continuous  Infusions:    LOS: 13 days    Time spent: 40 mins    Irine Seal, MD [facility] Pager [phone]  If 7PM-7AM, please contact night-coverage [email or web address] Password [facility] 04/20/2017, 12:14 PM

## 2017-04-20 NOTE — Progress Notes (Signed)
Patient tolerating tube feeding well. No complaints of n/v.

## 2017-04-21 ENCOUNTER — Ambulatory Visit: Payer: Self-pay | Admitting: Licensed Clinical Social Worker

## 2017-04-21 DIAGNOSIS — Z008 Encounter for other general examination: Secondary | ICD-10-CM

## 2017-04-21 LAB — BASIC METABOLIC PANEL
Anion gap: 11 (ref 5–15)
BUN: 16 mg/dL (ref 6–20)
CALCIUM: 8.4 mg/dL — AB (ref 8.9–10.3)
CO2: 23 mmol/L (ref 22–32)
CREATININE: 0.86 mg/dL (ref 0.61–1.24)
Chloride: 102 mmol/L (ref 101–111)
GFR calc non Af Amer: >60 mL/min (ref >60)
Glucose, Bld: 111 mg/dL — ABNORMAL HIGH (ref 65–99)
Potassium: 4 mmol/L (ref 3.5–5.1)
SODIUM: 136 mmol/L (ref 135–145)

## 2017-04-21 LAB — GLUCOSE, CAPILLARY
GLUCOSE-CAPILLARY: 106 mg/dL — AB (ref 65–99)
GLUCOSE-CAPILLARY: 174 mg/dL — AB (ref 65–99)
Glucose-Capillary: 107 mg/dL — ABNORMAL HIGH (ref 65–99)
Glucose-Capillary: 107 mg/dL — ABNORMAL HIGH (ref 65–99)
Glucose-Capillary: 107 mg/dL — ABNORMAL HIGH (ref 65–99)
Glucose-Capillary: 156 mg/dL — ABNORMAL HIGH (ref 65–99)

## 2017-04-21 LAB — CBC
HEMATOCRIT: 37.2 % — AB (ref 39.0–52.0)
Hemoglobin: 11.9 g/dL — ABNORMAL LOW (ref 13.0–17.0)
MCH: 30.1 pg (ref 26.0–34.0)
MCHC: 32 g/dL (ref 30.0–36.0)
MCV: 93.9 fL (ref 78.0–100.0)
Platelets: 235 10*3/uL (ref 150–400)
RBC: 3.96 MIL/uL — ABNORMAL LOW (ref 4.22–5.81)
RDW: 16.7 % — AB (ref 11.5–15.5)
WBC: 8.5 10*3/uL (ref 4.0–10.5)

## 2017-04-21 LAB — MAGNESIUM: MAGNESIUM: 2.2 mg/dL (ref 1.7–2.4)

## 2017-04-21 MED ORDER — FREE WATER
120.0000 mL | Freq: Every day | Status: DC
  Administered 2017-04-21 – 2017-04-26 (×30): 120 mL

## 2017-04-21 MED ORDER — FREE WATER: 120.0000 mL | Freq: Four times a day (QID) | Status: DC

## 2017-04-21 MED ORDER — BACITRACIN-NEOMYCIN-POLYMYXIN OINTMENT TUBE
TOPICAL_OINTMENT | Freq: Every day | CUTANEOUS | Status: DC
  Administered 2017-04-21 – 2017-04-26 (×6): via TOPICAL
  Filled 2017-04-21: qty 14.17

## 2017-04-21 MED ORDER — DIPHENHYDRAMINE HCL 50 MG/ML IJ SOLN
25.0000 mg | Freq: Once | INTRAMUSCULAR | Status: AC
  Administered 2017-04-22: 25 mg via INTRAVENOUS
  Filled 2017-04-21: qty 1

## 2017-04-21 MED ORDER — JEVITY 1.5 CAL/FIBER PO LIQD
240.0000 mL | Freq: Every day | ORAL | Status: DC
  Administered 2017-04-21 – 2017-04-26 (×29): 240 mL
  Filled 2017-04-21 (×39): qty 1000

## 2017-04-21 MED ORDER — JEVITY 1.5 CAL/FIBER PO LIQD
360.0000 mL | Freq: Every day | ORAL | Status: DC
  Administered 2017-04-21: 240 mL
  Filled 2017-04-21 (×7): qty 1000

## 2017-04-21 MED ORDER — QUETIAPINE FUMARATE 25 MG PO TABS
12.5000 mg | ORAL_TABLET | Freq: Every day | ORAL | Status: DC
  Administered 2017-04-22: 12.5 mg via ORAL
  Filled 2017-04-21: qty 1

## 2017-04-21 NOTE — Progress Notes (Signed)
Nutrition Follow-up  DOCUMENTATION CODES:   Non-severe (moderate) malnutrition in context of chronic illness  INTERVENTION:   Adjust bolus feedings to 240 ml Jevity 1.5 via PEG 6 times daily  60 ml free water flush before and after each feeding administration   Tube feeding regimen provides 2160 kcal (>100% of needs), 92 grams of protein, and 1095 ml of H2O. (Total free water: 1815 ml)  NUTRITION DIAGNOSIS:   Moderate Malnutrition related to chronic illness(CHF, SDH) as evidenced by mild fat depletion, moderate fat depletion, mild muscle depletion, moderate muscle depletion, percent weight loss.  Ongoing  GOAL:   Patient will meet greater than or equal to 90% of their needs  Progressing  MONITOR:   Diet advancement, Weight trends, TF tolerance, Skin, I & O's  REASON FOR ASSESSMENT:   Consult Assessment of nutrition requirement/status, Enteral/tube feeding initiation and management  ASSESSMENT:   [name] is a 84 y.o. male with medical history significant of afib not on anticoagulation due to prior subdural hemorrhage, PE, hypertension, hyperlipidemia, diet-controlled diabetes, asthma, GERD, SDH, CAD, s/p of CABG and stent placement, sCHF with EF 35%, AICD placement, who presents with cough, chest pain or fever.  3/10- s/p BSE- recommend NPO 3/11- s/p MBSS- recommend continue NPO 3/12- s/p repeat BSE- recommend continued NPO with nutrition via alternative needs 3/14- cortrak tube placed; tip of tube confirmed post-pyloric, TF initiated 3/19- repeat BSE, recommended continued NPO with nutrition via alternative means; TF stopped PM due to clogged tube 3/20- PEG placed today via IR, cortrak tube removed 3/21- bolus feedings initiated  Case discussed with RN, who reports pt is only tolerating 260 ml bolus; complains of abdominal fullness after tube feeding. Spoke with pt, who confirmed this finding. Discussed adjusting feeding schedule to receive decreased volume  of feed more frequently; pt amenable. RN informed of plan and change in orders.   Per RN, plan to d/c to either SNF or CIR once medically stable.  Labs reviewed: CBGS: 106-157.   Diet Order:  Fall precautions Diet NPO time specified Except for: [employer]  EDUCATION NEEDS:   Education needs have been addressed  Skin:  Skin Assessment: Skin Integrity Issues: Skin Integrity Issues:: Other (Comment) Other: skin tear rt and lt elbows  Last BM:  04/20/17  Height:   Ht Readings from Last 1 Encounters:  04/07/17 6\' 1"  (1.854 m)    Weight:   Wt Readings from Last 1 Encounters:  04/21/17 159 lb 9.6 oz (72.4 kg)    Ideal Body Weight:  83.6 kg  BMI:  Body mass index is 21.06 kg/m.  Estimated Nutritional Needs:   Kcal:  1850-2050  Protein:  90-105 grams  Fluid:  1.8-2.0 L    Seham Gardenhire A. Jimmye Norman, RD, LDN, CDE Pager: [phone] After hours Pager: [phone]

## 2017-04-21 NOTE — Progress Notes (Signed)
Patient complaining of back itching, RN observed small red bumps in back, NP notified for itch cream.

## 2017-04-21 NOTE — Plan of Care (Signed)
  Problem: Clinical Measurements: Goal: Ability to maintain clinical measurements within normal limits will improve Outcome: Adequate for Discharge   Problem: Clinical Measurements: Goal: Respiratory complications will improve Outcome: Adequate for Discharge   Problem: Nutrition: Goal: Adequate nutrition will be maintained Outcome: Adequate for Discharge

## 2017-04-21 NOTE — Progress Notes (Signed)
Patient is supposed to have triple antibiotic ointment ordered for PEG site x7 days?

## 2017-04-21 NOTE — Progress Notes (Signed)
Daily Progress Note   Patient Name: [name]       Date: 04/21/2017 DOB: 03-Nov-1932  Age: 83 y.o. MRN#: [id number] Attending Physician: Eugenie Filler, MD Primary Care Physician: [name], MD Admit Date: 04/07/2017  Reason for Consultation/Follow-up: Establishing goals of care and Psychosocial/spiritual support  Subjective: Patient had called the palliative medicine team asking for me on 04/20/2017.  Went to see patient first thing this morning, and his issue is that he now is feeling better, and wants to go back home versus rehab.  He states rehab in the past has not helped him.  Even though he has a new PEG tube and acknowledges he does not know how to use it, he still feels like he is safe to go home.  I attempted to talk through the issues with him in terms of how to use his tube, that he is now weaker and that he does not have 24/7 caregivers in the home.  He does not show good awareness of his self-care abilities and he is been very inconsistent in his appreciation of his clinical status.  Apparently, he has seen his daughter but tells me she lives in [place] and is working full-time.  He describes his other source of support is "I have good neighbors around me that we will check on me".  Patient was seen by psychiatry on 3/19 and felt not to have capacity.  DNR status was obtained through conversations by palliative medicine provider as well as patient's son on 04/18/2017.  He is still working on handling food boluses via PEG  Length of Stay: 14  Current Medications: Scheduled Meds:  . digoxin  0.125 mg Oral Daily  . docusate  50 mg Per Tube BID  . feeding supplement (JEVITY 1.5 CAL/FIBER)  360 mL Per Tube QID  . free water  180 mL Per Tube QID  .  guaiFENesin-dextromethorphan  15 mL Per Tube TID AC & HS  . metoprolol tartrate  25 mg Per Tube BID  .  morphine injection  2 mg Intravenous Once  . QUEtiapine  12.5 mg Oral QHS  . sennosides  5 mL Per Tube BID    Continuous Infusions:   PRN Meds: acetaminophen, calcium carbonate, fluticasone, gabapentin, ipratropium-albuterol, ketorolac, loratadine, LORazepam, menthol-cetylpyridinium, morphine injection, nitroGLYCERIN, ondansetron (ZOFRAN) IV,  phenol  Physical Exam  Constitutional:  Frail, elderly man; in no acute distress, sitting up in a recliner  HENT:  Head: Normocephalic and atraumatic.  Neck: Normal range of motion.  Cardiovascular: Normal rate.  Pulmonary/Chest: Effort normal.  Abdominal: Soft.  Neurological: He is alert.  He is oriented to person place and situation however he does not show a good understanding of the severity of his clinical status nor his self-care abilities  Skin: Skin is warm and dry. There is pallor.  Psychiatric:  He reports feeling better Affect brighter Short-term memory deficits are evident Despite not knowing how to use his feeding tube he is still verbalizing that he will be able to be able to manage this at home  Nursing note and vitals reviewed.           Vital Signs: BP 112/76   Pulse (!) 108   Temp (!) 97.5 F (36.4 C) (Oral)   Resp 20   Ht '6\' 1"'  (1.854 m)   Wt 72.4 kg (159 lb 9.6 oz)   SpO2 91%   BMI 21.06 kg/m  SpO2: SpO2: 91 % O2 Device: O2 Device: Room Air O2 Flow Rate: O2 Flow Rate (L/min): 2 L/min  Intake/output summary:   Intake/Output Summary (Last 24 hours) at 04/21/2017 1102 Last data filed at 04/21/2017 0900 Gross per 24 hour  Intake 1510 ml  Output 701 ml  Net 809 ml   LBM: Last BM Date: 04/20/17 Baseline Weight: Weight: 76.7 kg (169 lb) Most recent weight: Weight: 72.4 kg (159 lb 9.6 oz)       Palliative Assessment/Data:    Flowsheet Rows     Most Recent Value  Intake Tab  Referral Department   Hospitalist  Unit at Time of Referral  Med/Surg Unit  Palliative Care Primary Diagnosis  Sepsis/Infectious Disease  Date Notified  04/13/17  Palliative Care Type  New Palliative care  Reason for referral  Clarify Goals of Care, Psychosocial or Spiritual support  Date of Admission  04/07/17  Date first seen by Palliative Care  04/14/17  # of days Palliative referral response time  1 Day(s)  # of days IP prior to Palliative referral  6  Clinical Assessment  Palliative Performance Scale Score  40%  Pain Max last 24 hours  Not able to report  Pain Min Last 24 hours  Not able to report  Dyspnea Max Last 24 Hours  Not able to report  Dyspnea Min Last 24 hours  Not able to report  Nausea Max Last 24 Hours  Not able to report  Nausea Min Last 24 Hours  Not able to report  Anxiety Max Last 24 Hours  Not able to report  Anxiety Min Last 24 Hours  Not able to report  Other Max Last 24 Hours  Not able to report  Psychosocial & Spiritual Assessment  Palliative Care Outcomes  Patient/Family meeting held?  Yes  Who was at the meeting?  pt  Palliative Care follow-up planned  Yes, Facility      Patient Active Problem List   Diagnosis Date Noted  . Adult failure to thrive   . Dysphagia   . DNR (do not resuscitate)   . Goals of care, counseling/discussion   . Palliative care by specialist   . Malnutrition of moderate degree 04/13/2017  . Sepsis ([place]) 04/07/2017  . Influenza A 04/07/2017  . CKD (chronic kidney disease), stage III ([place]) 04/07/2017  . Acute respiratory failure with hypoxia ([place]) 04/07/2017  .  Syncope 04/01/2017  . Acute kidney injury ([place]) 04/01/2017  . Thyroid nodule 04/01/2017  . Evaluation by psychiatric service required   . Chronic pulmonary embolism ([place]) 03/27/2017  . Acute confusion 03/04/2017  . Failure of implantable cardioverter-defibrillator (ICD) lead 03/02/2017  . Rectal ulcer   . Rectal bleeding   . Proctitis 02/07/2017  . Abdominal pain 02/06/2017  .  Acute radiation proctitis 02/06/2017  . Failure of implantable cardioverter-defibrillator lead 11/10/2016  . ICD (implantable cardioverter-defibrillator) in place 04/08/2016  . Pharyngoesophageal dysphagia 12/31/2013  . Gastroesophageal reflux disease without esophagitis 12/31/2013  . Hypotension 09/23/2011  . HCAP (healthcare-associated pneumonia) 09/23/2011  . SIRS (systemic inflammatory response syndrome) ([place]) 09/23/2011  . Thrombocytopenia ([place]) 09/23/2011  . History of subdural hemorrhage 09/08/2011  . Ataxia 08/17/2011  . Dehydration 08/17/2011  . Atrial fibrillation with controlled ventricular response ([place]) 08/13/2011  . Colon polyp 07/07/2011  . Angiodysplasia of colon 07/07/2011  . GERD (gastroesophageal reflux disease) 05/31/2011  . Anemia, iron deficiency 12/29/2010  . Chronic anticoagulation discontinued July 2013 after developed subdural hematoma 09/22/2010  . Bradycardia 09/22/2010  . CAD (coronary artery disease) 06/25/2010  . ventricular tachycardia 12/30/2009  . Chronic atrial fibrillation ([place]) 12/30/2009  . Chronic systolic heart failure- EF 35-40% 12/30/2009  . Automatic implantable cardioverter-defibrillator in situ 12/30/2009    Palliative Care Assessment & Plan   Patient Profile: 84 y.o.malewith past medical history of atrial fib, subdural hematoma, PE, hypertension, hyperlipidemia, diabetes, GERD, coronary artery disease status post CABG, AICD, CHF with an ejection fraction of 35%admitted on 3/8/2019with cough, weakness. Patient was found to have right lower lobe pneumonia and was positive for flu a. Patient has had 6 hospitalizations in the past 6 months as well as 4 emergency room visits secondary to declining health. This is the first time palliative medicine team has met with Mr. Molyneux  Consult ordered for goals of care.   Since admission patient has had a PEG tube placed.  He was seen by psychiatry on 04/18/2017 and deemed not to have  capacity.  At this point he needs teaching in terms of management of PEG tube as well as skilled nursing facility for deconditioning.  He is currently stating that he wishes to go home versus rehab   Assessment: Spoke to case management regarding patient's change of heart regarding rehab as well as Dr. Grandville Silos who is his attending.  Patient as noted was deemed not to have capacity on 04/18/2017 by psychiatric services.  He does show poor insight into his ability to care for himself especially now that he has additional needs such as a PEG.  He has very limited social support.  He has 1 daughter who resides in [place] as well as a son in [place]  Recommendations/Plan:  Is CIR a possibility?  Otherwise, discharge to sniff with rehab with palliative medicine to follow in the facility  At this point patient is deemed not to have capacity and he does show a lack of awareness, as well as consistency.  At this point, if he was reevaluated by psychiatry, I do not feel like he would have evidence that he does now have capacity although he is feeling better.  He still would have the same care needs that he needs support with in terms of PEG as well as deconditioning  Goals of Care and Additional Recommendations:  Limitations on Scope of Treatment: Patient is now a DNR but otherwise other goals of care will need to be addressed  Code Status:  Code Status Orders  (From admission, onward)        Start     Ordered   04/18/17 1503  Do not attempt resuscitation (DNR)  Continuous    Question Answer Comment  In the event of cardiac or respiratory ARREST Do not call a "code blue"   In the event of cardiac or respiratory ARREST Do not perform Intubation, CPR, defibrillation or ACLS   In the event of cardiac or respiratory ARREST Use medication by any route, position, wound care, and other measures to relive pain and suffering. May use oxygen, suction and manual treatment of airway obstruction as  needed for comfort.      04/18/17 1502    Code Status History    Date Active Date Inactive Code Status Order ID Comments User Context   04/07/2017 0615 04/18/2017 1502 Full Code [id number]  Ivor Costa, MD ED   04/01/2017 1539 04/04/2017 1526 Full Code [id number]  Radene Gunning, NP ED   03/27/2017 0037 03/29/2017 1958 Full Code [id number]  Etta Quill, DO ED   03/02/2017 1657 03/05/2017 1704 Full Code [id number]  Evans Lance, MD Inpatient   02/06/2017 2306 02/10/2017 1531 Full Code [id number]  Theodis Blaze, MD Inpatient   11/10/2016 2031 11/14/2016 1456 Full Code [id number]  Jacolyn Reedy, MD ED   04/08/2016 1428 04/09/2016 1411 Full Code [id number]  Evans Lance, MD Inpatient   09/26/2011 1252 10/11/2011 1949 Full Code [id number]  Samella Parr, NP Inpatient       Prognosis:   Unable to determine  Discharge Planning:  [place] for rehab with Palliative care service follow-up  Care plan was discussed with CM, as well as Dr. Grandville Silos  Thank you for allowing the Palliative Medicine Team to assist in the care of this patient.   Time In: 0930 Time Out: 1000 Total Time 30 min Prolonged Time Billed  no       Greater than 50%  of this time was spent counseling and coordinating care related to the above assessment and plan.  Dory Horn, NP  Please contact Palliative Medicine Team phone at [phone] for questions and concerns.

## 2017-04-21 NOTE — Progress Notes (Signed)
Patients scheduled seroquel is a pill  There are orders to not crush pills for patients tube- pharmacy notified and there is no liquid suspension for this med- NP notified.

## 2017-04-21 NOTE — Progress Notes (Signed)
  Speech Language Pathology Treatment: Dysphagia  Patient Details Name: [name] MRN: [id number] DOB: [birth year] Today's Date: 04/21/2017 Time: 4287-6811 SLP Time Calculation (min) (ACUTE ONLY): 17 min  Assessment / Plan / Recommendation Clinical Impression  Pt alert,cooperative during dysphagia treatment. Needed moderate verbal and visual cues for skilled therapeutic exercise consisting of chin tuck against resistance, laryngeal elevation with pitch modulation, effortful swallow, hard volitional cough and tongue base/pharngeal wall contraction (Masako maneuver). Immediate and delayed throat clears and coughs with ice chip trials expectorating mucous. Pt has PEG for nutrition, agree with SNF for further rehab and hopeful return to po's.    HPI HPI: Joshua Sacks Ludwickis a 84 y.o.malewith medical history significant ofafibnot on anticoagulation due to prior subdural hemorrhage,PE,hypertension, hyperlipidemia, diet-controlled diabetes, asthma, GERD, SDH, CAD,s/p ofCABGandstent placement,sCHFwith EF 35%, AICD placement, who presents with cough, chest pain and fever. T-max 102.1. Right lower lobe infiltrates. Admitted for HCAP complicated by influenza A infection. Pt has a history of mild oropharyngeal and cervical esophageal dysphagia per MBS 02/07/14; pharyngeal residuals noted across consistencies that liquid swallow help to clear but with resultant trace aspiration of liquids (thin and nectar). Regular diet with thin liquids was recommended as pt's weight was stable and no PNA since 2013. MBS recommended to fully evaluate oropharyngeal swallow.       SLP Plan  Continue with current plan of care       Recommendations  Diet recommendations: NPO Medication Administration: Via alternative means                Oral Care Recommendations: Oral care QID Follow up Recommendations: Skilled Nursing facility SLP Visit Diagnosis: Dysphagia, oropharyngeal phase (R13.12) Plan:  Continue with current plan of care                      [name] 04/21/2017, 10:11 AM   [name].[employer] [phone]

## 2017-04-21 NOTE — Consult Note (Signed)
Physical Medicine and Rehabilitation Consult Reason for Consult: Decreased functional mobility Referring Physician: [facility]   HPI: [name] is a 84 y.o. right-handed male with history of sundowning syndrome atrial fibrillation not on anticoagulation due to prior subdural hemorrhage, hypertension, chronic dysphasia, diabetes mellitus, CAD with CABG with AICD placement.  Per chart review patient lives alone.  Used a walker prior to admission.  One level home.  He was still driving prior to admission.  No local family.  He has a neighbor who checks on him as needed to help provide meals when needed.  Presented 04/01/2017 after unwitnessed fall.  Cranial CT scan showed no acute processes.  CT cervical spine negative.  Troponin 0 0.06, creatinine 1.88.  Chest x-ray no active disease.  Hospital course patient developed cough with low-grade fever.  Influenza A screen positive.  Lactic acid 1.81.  Chest x-ray follow-up showed possible patchy infiltration right base.  Noted dysphagia reported to be chronic with follow-up per speech therapy Swallow study completed advise n.p.o.  Recommendations made for PEG tube however psychiatry services consulted did not feel patient could demonstrate capacity to make decisions.  Family was contacted and patient  underwent placement of gastrostomy tube 04/20/2017 per interventional radiology for nutritional support.  Palliative care consulted speculation need for skilled nursing facility as well as patient request for discharge to home.  Therapies have been initiated.  MD has requested physical medicine rehab consult   Review of Systems  Constitutional: Negative for fever.  HENT: Negative for hearing loss and tinnitus.   Eyes: Negative for blurred vision and double vision.  Respiratory: Positive for cough.   Cardiovascular: Positive for chest pain, palpitations and leg swelling.  Gastrointestinal: Positive for constipation. Negative for nausea and vomiting.    GERD with chronic dysphasia  Genitourinary: Positive for urgency. Negative for dysuria and hematuria.  Musculoskeletal: Positive for falls.  Neurological: Positive for weakness. Negative for seizures.  Psychiatric/Behavioral:       Sundowning syndrome  All other systems reviewed and are negative.  Past Medical History:  Diagnosis Date  . Abnormal thyroid scan    Abnormal thyroid imaging studies from 11/09/2010, status post ultrasound guided fine needle aspiration of the dominant left inferior thyroid nodule on 12/15/2010. Cytology report showed rare follicular epithelial cells and hemosiderin laden macrophages.  . Adenomatous colon polyp   . AICD (automatic cardioverter/defibrillator) present    a. fx lead; a. s/p lead extraction 03/02/17  . Arthritis    "all over"  . Asthma   . Atrial fibrillation ([place])    on chronic Coumadin; stopped July 2013 due to subdural hematomas  . CHF (congestive heart failure) (HCC)    EF 35-40% s/p most recent ICD generator change-out with Medtronic dual-chamber ICD 05/20/11 with explantation of previous abdominally-implanted device  . Coronary artery disease    s/p CABG 1983 and PCI/stent 2004.   . Diabetes mellitus    diet controlled  . Diverticulosis   . Dyslipidemia   . Enteritis   . Erythrocytosis   . GERD (gastroesophageal reflux disease)   . Hepatic steatosis   . Hypertension   . Ischemic cardiomyopathy    WITH CHF  . Monocytosis 04/17/2013  . Myocardial infarction ([place]) 1983; ~ 1990  . Pleural effusion    right  . Pneumonia August 2013  . Portal hypertensive gastropathy ([place])   . Rectal ulcer   . Renal calculi   . Subdural hematoma [facility]) July 2013   Anticoagulation stopped.   Marland Kitchen  SunDown syndrome   . VT (ventricular tachycardia) ([place])    Past Surgical History:  Procedure Laterality Date  . CHOLECYSTECTOMY    . COLONOSCOPY  07/07/2011   Procedure: COLONOSCOPY;  Surgeon: Jerene Bears, MD;  Location: [facility];  Service:  Gastroenterology;  Laterality: N/A;  . CORONARY ANGIOPLASTY WITH STENT PLACEMENT  2004   Tandem Cypher stents LAD  . CORONARY ARTERY BYPASS GRAFT  1983   SVG-mLAD  . ESOPHAGOGASTRODUODENOSCOPY  02/11/2011   Procedure: ESOPHAGOGASTRODUODENOSCOPY (EGD);  Surgeon: Beryle Beams, MD;  Location: [facility];  Service: Endoscopy;  Laterality: N/A;  . FLEXIBLE SIGMOIDOSCOPY N/A 02/08/2017   Procedure: FLEXIBLE SIGMOIDOSCOPY;  Surgeon: Irene Shipper, MD;  Location: [facility];  Service: Endoscopy;  Laterality: N/A;  . ICD GENERATOR CHANGEOUT N/A 04/08/2016   Procedure: ICD Generator Changeout;  Surgeon: Evans Lance, MD;  Location: [place] [facility];  Service: Cardiovascular;  Laterality: N/A;  . ICD LEAD REMOVAL N/A 03/02/2017   Procedure: ICD LEAD REMOVAL;  Surgeon: Evans Lance, MD;  Location: [place];  Service: Cardiovascular;  Laterality: N/A;  . IMPLANTABLE CARDIOVERTER DEFIBRILLATOR (ICD) GENERATOR CHANGE N/A 05/20/2011   Procedure: ICD GENERATOR CHANGE;  Surgeon: Evans Lance, MD;  Medtronic secure dual-chamber ICD serial number [id number]   . IR GASTROSTOMY TUBE MOD SED  04/19/2017  . KNEE ARTHROSCOPY     right; "just went in and scraped it"  . LEAD INSERTION N/A 03/02/2017   Procedure: LEAD INSERTION;  Surgeon: Evans Lance, MD;  Location: [place];  Service: Cardiovascular;  Laterality: N/A;  . MASS EXCISION Right 05/10/2013   Procedure: EXCISION MASS RIGHT THUMB;  Surgeon: Wynonia Sours, MD;  Location: [place];  Service: Orthopedics;  Laterality: Right;  . PROXIMAL INTERPHALANGEAL FUSION (PIP) Right 05/10/2013   Procedure: DEBRIDEMENT PROXIMAL INTERPHALANGEAL FUSION (PIP);  Surgeon: Wynonia Sours, MD;  Location: [place];  Service: Orthopedics;  Laterality: Right;  . TONSILLECTOMY         Family History  Problem Relation Age of Onset  . Tuberculosis Mother   . Tuberculosis Father   . Heart disease Brother   . Diabetes Sister   . Diabetes Brother     . Clotting disorder Brother    Social History:  reports that he quit smoking about 40 years ago. His smoking use included cigarettes. He has a 60.00 pack-year smoking history. He has never used smokeless tobacco. He reports that he does not drink alcohol or use drugs. Allergies:  Allergies  Allergen Reactions  . Tamsulosin Other (See Comments)    Dizziness, Made BP very low and weakness  . Celebrex [Celecoxib] Hives and Nausea And Vomiting    Gi upset  . Dronedarone Nausea And Vomiting and Other (See Comments)    GI upset, abdominal pain  . Esomeprazole Magnesium Hives and Other (See Comments)    "don't really remember"  . Digoxin Diarrhea    May have caused some diarrhea  . Hydrocodone-Acetaminophen Other (See Comments)    Bad headache  . Protonix [Pantoprazole Sodium] Nausea And Vomiting and Other (See Comments)    Tolerates Dexilant   Medications Prior to Admission  Medication Sig Dispense Refill  . acetaminophen (TYLENOL) 325 MG tablet Take 2 tablets (650 mg total) by mouth every 6 (six) hours as needed for mild pain (or Fever >/= 101).    . ADVAIR DISKUS 250-50 MCG/DOSE AEPB Inhale 1 puff into the lungs 2 (two) times daily.  4  . calcium carbonate (TUMS - DOSED IN MG ELEMENTAL CALCIUM) 500 MG chewable tablet Chew 500 mg by mouth daily as needed for heartburn.     . digoxin (LANOXIN) 0.125 MG tablet Take 1 tablet (0.125 mg total) by mouth daily. 30 tablet 0  . fluticasone (FLONASE) 50 MCG/ACT nasal spray Place 2 sprays into both nostrils daily. (Patient taking differently: Place 1 spray into both nostrils daily as needed for allergies. ) 16 g 11  . furosemide (LASIX) 40 MG tablet Take 0.5 tablets (20 mg total) by mouth daily. May take an extra tab daily as needed for swelling 90 tablet 3  . gabapentin (NEURONTIN) 100 MG capsule TAKE 1 CAPSULE (100MG ) TO 3 CAPSULES (300MG ) EACH NIGHT AS NEEDED FOR MUSCLE PAINS (Patient taking differently: Take 100-300 mg by mouth at bedtime as  needed (for mucle pains). ) 90 capsule 5  . lisinopril (PRINIVIL,ZESTRIL) 10 MG tablet Take 0.5 tablets (5 mg total) by mouth daily. 30 tablet 0  . loratadine (CLARITIN) 10 MG tablet Take 10 mg by mouth daily as needed for allergies.     . metoprolol succinate (TOPROL-XL) 25 MG 24 hr tablet Take 1 tablet (25 mg total) by mouth daily. 30 tablet 0  . nitroGLYCERIN (NITROSTAT) 0.4 MG SL tablet PLACE ONE UNDER TONGUE FOR CHEST PAIN. (Patient taking differently: Place 0.4 mg under tongue as needed for chest pain) 25 tablet 11  . phenol (CHLORASEPTIC) 1.4 % LIQD Use as directed 1 spray in the mouth or throat as needed for throat irritation / pain.  0  . potassium chloride (K-DUR,KLOR-CON) 10 MEQ tablet Take 1 tablet (10 mEq total) by mouth daily. 30 tablet 6  . traMADol (ULTRAM) 50 MG tablet TAKE 2 TABLETS BY MOUTH EVERY 8 HOURS AS NEEDED FOR PAIN (Patient taking differently: Take 100 mg by mouth every 8 (eight) hours as needed for moderate pain. ) 180 tablet 1  . RESTASIS 0.05 % ophthalmic emulsion INSTILL 1 DROP INTO BOTH EYES TWICE A DAY (Patient not taking: Reported on 04/01/2017) 180 mL 3    Home: Home Living Family/patient expects to be discharged to:: Private residence Living Arrangements: Alone Available Help at Discharge: Neighbor, Other (Comment)(checks in a few times a week) Type of Home: House Home Access: Level entry Home Layout: One level Bathroom Shower/Tub: Multimedia programmer: Handicapped height Bathroom Accessibility: Yes Home Equipment: Environmental consultant - 2 wheels, Walker - 4 wheels, Cane - single point, Grab bars - toilet, Grab bars - tub/shower  Functional History: Prior Function Level of Independence: Independent with assistive device(s) Comments: cane vs rollator Functional Status:  Mobility: Bed Mobility Overal bed mobility: Needs Assistance Bed Mobility: Supine to Sit Supine to sit: Min assist, HOB elevated Sit to supine: HOB elevated, Min guard General bed  mobility comments: Pt performed bed mobility with cues for technique.  Pre bed mobility performed rolling to place abdominal binder.   Transfers Overall transfer level: Needs assistance Equipment used: Rolling walker (2 wheeled) Transfers: Sit to/from Stand Sit to Stand: Min assist General transfer comment: verbal cues for hand placement. Assist to power up. Pt performed from edge of bed x2.  Pt reports mild dizziness in standing. Ambulation/Gait Ambulation/Gait assistance: Min assist(assistance to move RW sideways.  ) Ambulation Distance (Feet): (side stepping to HOB around 4 ft.  ) Assistive device: Rolling walker (2 wheeled) Gait Pattern/deviations: Step-through pattern, Shuffle, Trunk flexed General Gait Details: Cues for upper trunk control and assistance with RW.  Gait velocity: Decreased  Gait velocity interpretation: Below normal speed for age/gender    ADL:    Cognition: Cognition Overall Cognitive Status: Within Functional Limits for tasks assessed Orientation Level: Oriented to time, Oriented to person, Oriented to place, Disoriented to situation Cognition Arousal/Alertness: Awake/alert Behavior During Therapy: WFL for tasks assessed/performed Overall Cognitive Status: Within Functional Limits for tasks assessed Area of Impairment: Orientation Orientation Level: Time, Place, Situation Memory: Decreased short-term memory Following Commands: Follows one step commands consistently Safety/Judgement: Decreased awareness of safety, Decreased awareness of deficits Awareness: Anticipatory Problem Solving: Requires verbal cues General Comments: He appears appropriate during session this am.  Able to recall past event accurately and he is alert and oriented x4.    Blood pressure 112/76, pulse (!) 108, temperature (!) 97.5 F (36.4 C), temperature source Oral, resp. rate 20, height 6\' 1"  (1.854 m), weight 72.4 kg (159 lb 9.6 oz), SpO2 91 %. Physical Exam  Vitals  reviewed. Constitutional:  84 year old right-handed male sitting up in chair.  HENT:  Head: Normocephalic.  Eyes: EOM are normal.  Neck: Normal range of motion. Neck supple. No thyromegaly present.  Cardiovascular:  Cardiac rate controlled  Respiratory: Effort normal and breath sounds normal. No respiratory distress.  GI: Soft. Bowel sounds are normal. He exhibits no distension.  PEG tube with abdominal binder in place  Neurological: He is alert.  Patient makes good eye contact with examiner.  Followed full commands.  He was able to recall his age date of birth and his children and their place of residence.    Results for orders placed or performed during the hospital encounter of 04/07/17 (from the past 24 hour(s))  Glucose, capillary     Status: Abnormal   Collection Time: 04/20/17 11:31 AM  Result Value Ref Range   Glucose-Capillary 136 (H) 65 - 99 mg/dL   Comment 1 Notify RN    Comment 2 Document in Chart   Glucose, capillary     Status: Abnormal   Collection Time: 04/20/17  4:32 PM  Result Value Ref Range   Glucose-Capillary 163 (H) 65 - 99 mg/dL   Comment 1 Notify RN    Comment 2 Document in Chart   Glucose, capillary     Status: Abnormal   Collection Time: 04/20/17  7:57 PM  Result Value Ref Range   Glucose-Capillary 127 (H) 65 - 99 mg/dL   Comment 1 Notify RN    Comment 2 Document in Chart   Glucose, capillary     Status: Abnormal   Collection Time: 04/21/17 12:08 AM  Result Value Ref Range   Glucose-Capillary 156 (H) 65 - 99 mg/dL   Comment 1 Notify RN    Comment 2 Document in Chart   Glucose, capillary     Status: Abnormal   Collection Time: 04/21/17  4:35 AM  Result Value Ref Range   Glucose-Capillary 107 (H) 65 - 99 mg/dL   Comment 1 Notify RN    Comment 2 Document in Chart   Magnesium     Status: None   Collection Time: 04/21/17  5:38 AM  Result Value Ref Range   Magnesium 2.2 1.7 - 2.4 mg/dL  CBC     Status: Abnormal   Collection Time: 04/21/17  5:38  AM  Result Value Ref Range   WBC 8.5 4.0 - 10.5 K/uL   RBC 3.96 (L) 4.22 - 5.81 MIL/uL   Hemoglobin 11.9 (L) 13.0 - 17.0 g/dL   HCT 37.2 (L) 39.0 - 52.0 %   MCV 93.9  78.0 - 100.0 fL   MCH 30.1 26.0 - 34.0 pg   MCHC 32.0 30.0 - 36.0 g/dL   RDW 16.7 (H) 11.5 - 15.5 %   Platelets 235 150 - 400 K/uL  Basic metabolic panel     Status: Abnormal   Collection Time: 04/21/17  5:38 AM  Result Value Ref Range   Sodium 136 135 - 145 mmol/L   Potassium 4.0 3.5 - 5.1 mmol/L   Chloride 102 101 - 111 mmol/L   CO2 23 22 - 32 mmol/L   Glucose, Bld 111 (H) 65 - 99 mg/dL   BUN 16 6 - 20 mg/dL   Creatinine, Ser 0.86 0.61 - 1.24 mg/dL   Calcium 8.4 (L) 8.9 - 10.3 mg/dL   GFR calc non Af Amer >60 >60 mL/min   GFR calc Af Amer >60 >60 mL/min   Anion gap 11 5 - 15  Glucose, capillary     Status: Abnormal   Collection Time: 04/21/17  7:55 AM  Result Value Ref Range   Glucose-Capillary 107 (H) 65 - 99 mg/dL   Ir Gastrostomy Tube Mod Sed  Result Date: 04/19/2017 CLINICAL DATA:  Dysphagia, needs enteral feeding support EXAM: PERC PLACEMENT GASTROSTOMY FLUOROSCOPY TIME:  3 minutes 24 seconds; 13 mGy TECHNIQUE: The procedure, risks, benefits, and alternatives were explained to the patient. Questions regarding the procedure were encouraged and answered. The patient understands and consents to the procedure. As antibiotic prophylaxis, cefazolin 2 g was ordered pre-procedure and administered intravenously within one hour of incision. A safe percutaneous approach was confirmed on recent CT abdomen. A 5 French angiographic catheter was placed as orogastric tube. The upper abdomen was prepped with Betadine, draped in usual sterile fashion, and infiltrated locally with 1% lidocaine. Intravenous Fentanyl and Versed were administered as conscious sedation during continuous monitoring of the patient's level of consciousness and physiological / cardiorespiratory status by the radiology RN, with a total moderate sedation time  of 10 minutes. Stomach was insufflated using air through the orogastric tube. An 16 French needle was advanced percutaneously into the gastric lumen under fluoroscopy. Gas could be aspirated and a small contrast injection confirmed intraluminal spread. The needle was exchanged over a guidewire for a 9 [place] vascular sheath, through which the snare device was advanced and used to snare a guidewire passed through the orogastric tube. This was withdrawn, and the snare attached to the 20 French pull-through gastrostomy tube, which was advanced antegrade, positioned with the internal bumper securing the anterior gastric wall to the anterior abdominal wall. Small contrast injection confirms appropriate positioning. The external bumper was applied and the catheter was flushed. COMPLICATIONS: COMPLICATIONS none IMPRESSION: 1. Technically successful 20 French pull-through gastrostomy placement under fluoroscopy. Electronically Signed   By: Lucrezia Europe M.D.   On: 04/19/2017 11:12    Assessment/Plan: Diagnosis: debility related to influenza A and multiple medical issues 1. Does the need for close, 24 hr/day medical supervision in concert with the patient's rehab needs make it unreasonable for this patient to be served in a less intensive setting? No 2. Co-Morbidities requiring supervision/potential complications:   3. Due to bladder management, bowel management, safety, disease management and pain management, does the patient require 24 hr/day rehab nursing? Potentially 4. Does the patient require coordinated care of a physician, rehab nurse, PT, OT to address physical and functional deficits in the context of the above medical diagnosis(es)? Potentially Addressing deficits in the following areas: balance, endurance, locomotion, transferring, bowel/bladder control, bathing and grooming 5.  Can the patient actively participate in an intensive therapy program of at least 3 hrs of therapy per day at least 5 days per  week? Potentially 6. The potential for patient to make measurable gains while on inpatient rehab is fair 7. Anticipated functional outcomes upon discharge from inpatient rehab are n/a  with PT, n/a with OT, n/a with SLP. 8. Estimated rehab length of stay to reach the above functional goals is: n/a 9. Anticipated D/C setting: Home 10. Anticipated post D/C treatments: [place] therapy 11. Overall Rehab/Functional Prognosis: good  RECOMMENDATIONS: This patient's condition is appropriate for continued rehabilitative care in the following setting: SNF Patient has agreed to participate in recommended program. Yes Note that insurance prior authorization may be required for reimbursement for recommended care.  Comment: Pt prefers to go to SNF which is close to his home.   Meredith Staggers, MD, [place] [facility] 04/21/2017    Lavon Paganini Casper, PA-C 04/21/2017

## 2017-04-21 NOTE — Progress Notes (Signed)
Tube site care done.

## 2017-04-21 NOTE — Progress Notes (Signed)
PROGRESS NOTE    [name]  ZOX:[id number] DOB: 04-22-1932 DOA: 04/07/2017 PCP: [name], MD    Brief Narrative:  [name] a 84 y.o.malewith medical history significant ofafibnot on anticoagulation due to prior subdural hemorrhage,PE,hypertension, hyperlipidemia, diet-controlled diabetes, asthma, GERD, SDH, CAD,s/p ofCABGandstent placement,sCHFwith EF 35%, AICD placement, who presents with cough, chest pain and fever of 102.1. He was found to have right lower lobe infiltrates. He was admitted for HCAP complicated by influenza A infection.  He has been treated with IV antibiotics and Tamiflu.  Throughout hospitalization, he has been intermittently confused.  Swallow evaluation revealed severe dysphagia.  He was kept n.p.o.  Eventually, cortrack was placed on 3/14 for nutrition.  Palliative care consulted for goals of care     Assessment & Plan:   Principal Problem:   Evaluation by psychiatric service required Active Problems:   Chronic atrial fibrillation (HCC)   Chronic systolic heart failure- EF 35-40%   CAD (coronary artery disease)   HCAP (healthcare-associated pneumonia)   Thrombocytopenia (HCC)   Pharyngoesophageal dysphagia   ICD (implantable cardioverter-defibrillator) in place   Sepsis ([place])   Influenza A   CKD (chronic kidney disease), stage III ([place])   Acute respiratory failure with hypoxia ([place])   Malnutrition of moderate degree   Goals of care, counseling/discussion   Palliative care by specialist   DNR (do not resuscitate)   Dysphagia   Adult failure to thrive  #1 acute hypoxic respiratory failure secondary to healthcare associated pneumonia/influenza A/acute on chronic aspiration Patient improving daily. Patient with sats of 93-95% on room air.  Patient status post 5 days of empiric IV cefepime and Tamiflu.  Currently stable.  Follow.  2.  Acute on chronic severe dysphagia Patient has been assessed by speech therapist and  underwent a modified barium swallow 04/10/2017 that showed moderate aspiration risk.  N.p.o. was recommended by speech therapy.  Coretrack was placed 04/13/2017 for medications, nutrition and hydration however core track was dislodged.  Patient was seen by palliative care was recommended for PEG tube placement.  PEG tube placed 04/19/2017 per interventional radiology.  Dietitian consulted and patient started on tube feeds.  3.  Acute metabolic encephalopathy Likely secondary to problem #1 with probable underlying dementia.  Likely at baseline. Follow.  4.  Chronic A. fib Continue digoxin and metoprolol for rate control.  Patient not a anticoagulation candidate due to prior history of GI bleed.  Follow.  5.  Polyneuropathy Continue current regimen of gabapentin.  6.  Chronic systolic heart failure/EF 35%/status post AICD Compensated.  Continue current regimen of beta-blocker.   7.  Atypical chest pain Patient with complaints of left-sided chest pain overnight of 04/17/2017.  Cardiac enzymes were negative x3.  EKG with no ischemic changes noted.  No further chest pain.   10.  Goals of care Patient seen by palliative care who discussed with patient and son over the phone who are in agreement with PEG tube.  Patient status post PEG tube placement per interventional radiology 04/19/2017.  Patient initially told palliative care will be interested in inpatient rehab and then patient subsequently changed his mind and wants to go to [facility] so his friends are closer and can visit him.  Patient will likely need palliative care vs hospice to follow in the outpatient setting.  Palliative care following.   DVT prophylaxis: SCDs Code Status: DNR Family Communication: Updated patient and neighbor at bedside. Disposition Plan: Likely skilled nursing facility with palliative care following versus hospice  following when medically stable fully in the next 24 hours.   Consultants:   Psychiatry: Dr. Mariea Clonts  04/18/2017  Palliative care [name], NP 04/14/2017  Inpatient rehab: Dr. Tessa Lerner 04/21/2017  Procedures:   PEG tube placement 04/19/2017 per Dr. Vernard Gambles  Chest x-ray 04/07/2017, 04/09/2017  Modified barium swallow 04/10/2017  Antimicrobials:   IV cefepime 04/08/2017>>>> 04/11/2017     Subjective: Patient denies any further pain around take tube sites.  No chest pain.  No shortness of breath.  Patient states now he wants to go to skilled nursing facility so his friends can be closer to visit with him.   Objective: Vitals:   04/20/17 2247 04/21/17 0517 04/21/17 0815 04/21/17 1209  BP: 136/81 115/76 112/76 99/79  Pulse: (!) 109 (!) 103 (!) 108 82  Resp:  20  18  Temp:  (!) 97.5 F (36.4 C)  97.7 F (36.5 C)  TempSrc:  Oral  Oral  SpO2:  91%  95%  Weight:  72.4 kg (159 lb 9.6 oz)    Height:        Intake/Output Summary (Last 24 hours) at 04/21/2017 1213 Last data filed at 04/21/2017 0900 Gross per 24 hour  Intake 1510 ml  Output 701 ml  Net 809 ml   Filed Weights   04/19/17 0448 04/20/17 0339 04/21/17 0517  Weight: 72.6 kg (160 lb 1.6 oz) 72.5 kg (159 lb 12.8 oz) 72.4 kg (159 lb 9.6 oz)    Examination:  General exam: NAD. Respiratory system: Lungs are clear to auscultation bilaterally anterior lung fields.  Normal respiratory effort.   Cardiovascular system: Irregularly irregular. No JVD, murmurs, rubs, gallops or clicks. No pedal edema. Gastrointestinal system: Abdomen is nondistended, nontender, soft, positive bowel sounds.  No rebound.  No guarding.  PEG site intact.   Central nervous system: Alert and oriented. No focal neurological deficits. Extremities: Symmetric 5 x 5 power. Skin: No rashes, lesions or ulcers Psychiatry: Judgement and insight appear poor to fair. Mood & affect appropriate.     Data Reviewed: I have personally reviewed following labs and imaging studies  CBC: Recent Labs  Lab 04/16/17 0721 04/20/17 0305 04/21/17 0538  WBC 6.5 12.4*  8.5  HGB 12.2* 12.2* 11.9*  HCT 38.5* 38.0* 37.2*  MCV 95.3 93.6 93.9  PLT 185 209 767   Basic Metabolic Panel: Recent Labs  Lab 04/17/17 0633 04/18/17 0206 04/19/17 0642 04/20/17 0305 04/21/17 0538  NA 139 135 136 136 136  K 4.4 4.3 4.1 4.7 4.0  CL 105 100* 101 101 102  CO2 23 26 24 25 23   GLUCOSE 132* 125* 126* 110* 111*  BUN 12 12 11 18 16   CREATININE 0.84 0.81 0.84 0.95 0.86  CALCIUM 8.2* 8.3* 8.5* 8.4* 8.4*  MG 1.5* 1.7 1.5* 2.7* 2.2   GFR: Estimated Creatinine Clearance: 67.8 mL/min (by C-G formula based on SCr of 0.86 mg/dL). Liver Function Tests: No results for input(s): AST, ALT, ALKPHOS, BILITOT, PROT, ALBUMIN in the last 168 hours. No results for input(s): LIPASE, AMYLASE in the last 168 hours. No results for input(s): AMMONIA in the last 168 hours. Coagulation Profile: Recent Labs  Lab 04/18/17 1658  INR 1.11   Cardiac Enzymes: Recent Labs  Lab 04/17/17 2035 04/18/17 0206 04/18/17 0744  TROPONINI <0.03 <0.03 <0.03   BNP (last 3 results) No results for input(s): PROBNP in the last 8760 hours. HbA1C: No results for input(s): HGBA1C in the last 72 hours. CBG: Recent Labs  Lab 04/20/17 1957 04/21/17  0008 04/21/17 0435 04/21/17 0755 04/21/17 1146  GLUCAP 127* 156* 107* 107* 106*   Lipid Profile: No results for input(s): CHOL, HDL, LDLCALC, TRIG, CHOLHDL, LDLDIRECT in the last 72 hours. Thyroid Function Tests: No results for input(s): TSH, T4TOTAL, FREET4, T3FREE, THYROIDAB in the last 72 hours. Anemia Panel: No results for input(s): VITAMINB12, FOLATE, FERRITIN, TIBC, IRON, RETICCTPCT in the last 72 hours. Sepsis Labs: No results for input(s): PROCALCITON, LATICACIDVEN in the last 168 hours.  No results found for this or any previous visit (from the past 240 hour(s)).       Radiology Studies: No results found.      Scheduled Meds: . digoxin  0.125 mg Oral Daily  . docusate  50 mg Per Tube BID  . feeding supplement (JEVITY 1.5  CAL/FIBER)  360 mL Per Tube QID  . free water  180 mL Per Tube QID  . guaiFENesin-dextromethorphan  15 mL Per Tube TID AC & HS  . metoprolol tartrate  25 mg Per Tube BID  .  morphine injection  2 mg Intravenous Once  . QUEtiapine  12.5 mg Oral QHS  . sennosides  5 mL Per Tube BID   Continuous Infusions:    LOS: 14 days    Time spent: 35 mins    Irine Seal, MD [facility] Pager [phone]  If 7PM-7AM, please contact night-coverage [email or web address] Password [facility] 04/21/2017, 12:13 PM

## 2017-04-21 NOTE — Progress Notes (Signed)
Patient more confused tonight and has been saying he "feels terrible", but can't pinpoint exactly how he feels. Patient showed signs of anxiousness and restlessness- PRN medication given. Patient also trying to get out of specially made safety bed without calling for assistance (like he usually does).

## 2017-04-21 NOTE — Progress Notes (Signed)
Physical Therapy Treatment Patient Details Name: [name] MRN: [id number] DOB: [birth year] Today's Date: 04/21/2017    History of Present Illness 84 y.o. male with medical history significant of afib not on anticoagulation due to prior subdural hemorrhage, PE, hypertension, hyperlipidemia, diet-controlled diabetes, asthma, GERD, SDH, CAD, s/p of CABG and stent placement, sCHF with EF 35%, AICD placement, who presents with cough, chest pain and fever.  Admitted for HCAP complicated by influenza A infection.    PT Comments    Pt performed short bouts of gait training but c/o fatigue and refused gait training in halls.  Pt perseverating on tube feed schedule and seems preoccupied with the process.  Pt however did require decreased assistance during functional mobility and continues to progress well in this aspect.  At this time SNF remains appropriate as patient lives alone and still requires supervision to min guard assist.  Pt also requires supervision due to poor safety with use of DME.  Plan next session to progress activity to pt tolerance.    Follow Up Recommendations  SNF;Supervision/Assistance - 24 hour     Equipment Recommendations  None recommended by PT    Recommendations for Other Services OT consult     Precautions / Restrictions Precautions Precautions: Fall;Other (comment) Precaution Comments: Hx of falls at home, PEG tube placement on 3/20 Required Braces or Orthoses: Other Brace/Splint Other Brace/Splint: abdominal binder Restrictions Weight Bearing Restrictions: No    Mobility  Bed Mobility Overal bed mobility: Needs Assistance Bed Mobility: Supine to Sit     Supine to sit: HOB elevated;Supervision Sit to supine: Supervision   General bed mobility comments: Pt required supervision for safety due to impulsivity but no physical assistance required during session this afternoon.    Transfers Overall transfer level: Needs assistance Equipment used:  Rolling walker (2 wheeled) Transfers: Sit to/from Stand Sit to Stand: Min guard Stand pivot transfers: Min guard       General transfer comment: Cues for hand placement.  Pt with increased strength and good progression with transfer teachiniques.    Ambulation/Gait Ambulation/Gait assistance: Min guard Ambulation Distance (Feet): 15 Feet(x2 trials.  ) Assistive device: Rolling walker (2 wheeled) Gait Pattern/deviations: Step-through pattern;Shuffle;Trunk flexed Gait velocity: Decreased Gait velocity interpretation: Below normal speed for age/gender General Gait Details: Cues for upper trunk control and forward gaze.  Pt presents with poor safety awareness in regards to body position in RW.  Pt remains to require cues for safety awareness.     Stairs            Wheelchair Mobility    Modified Rankin (Stroke Patients Only)       Balance Overall balance assessment: Needs assistance   Sitting balance-Leahy Scale: Good       Standing balance-Leahy Scale: Fair Standing balance comment: Pt able to stand statically to pull his pants up.                              Cognition Arousal/Alertness: Awake/alert Behavior During Therapy: WFL for tasks assessed/performed Overall Cognitive Status: Within Functional Limits for tasks assessed                                 General Comments: He appears appropriate during session this am.  Able to recall past event accurately and he is alert and oriented x4.  Pt concerned with his tube feeding  schedule.        Exercises      General Comments        Pertinent Vitals/Pain Pain Assessment: No/denies pain    Home Living                      Prior Function            PT Goals (current goals can now be found in the care plan section) Acute Rehab PT Goals Patient Stated Goal: feel better Potential to Achieve Goals: Good Progress towards PT goals: Progressing toward goals     Frequency    Min 3X/week      PT Plan Current plan remains appropriate    Co-evaluation              AM-PAC PT "6 Clicks" Daily Activity  Outcome Measure  Difficulty turning over in bed (including adjusting bedclothes, sheets and blankets)?: A Little Difficulty moving from lying on back to sitting on the side of the bed? : A Lot Difficulty sitting down on and standing up from a chair with arms (e.g., wheelchair, bedside commode, etc,.)?: A Lot Help needed moving to and from a bed to chair (including a wheelchair)?: A Little Help needed walking in hospital room?: A Little Help needed climbing 3-5 steps with a railing? : A Lot 6 Click Score: 15    End of Session Equipment Utilized During Treatment: Gait belt Activity Tolerance: Patient limited by lethargy Patient left: in bed;with call bell/phone within reach;with nursing/sitter in room Nurse Communication: Mobility status PT Visit Diagnosis: Unsteadiness on feet (R26.81);Muscle weakness (generalized) (M62.81);History of falling (Z91.81)     Time: 3785-8850 PT Time Calculation (min) (ACUTE ONLY): 23 min  Charges:  $Gait Training: 8-22 mins $Therapeutic Activity: 8-22 mins                    G Codes:       [name], PTA pager [phone]    [name] 04/21/2017, 1:04 PM

## 2017-04-21 NOTE — Progress Notes (Signed)
Return call made for Joshua Jr.,patients son  [phone].Phone just keeps on ringing. Left message.

## 2017-04-21 NOTE — Progress Notes (Signed)
Neosporin ordered for tube site care x7 days (as per care order), consulted with 2 RN's and pharmacy.

## 2017-04-21 NOTE — Progress Notes (Signed)
Inpatient Rehabilitation  Please see consult by Dr. Naaman Plummer for full details.  Plan is for patient to go to SNF level of post acute rehab.  Will sign of at this time.  Notified nurse case Freight forwarder.    Carmelia Roller., CCC/SLP Admission Coordinator  [place]  Cell [phone]

## 2017-04-22 LAB — GLUCOSE, CAPILLARY
GLUCOSE-CAPILLARY: 131 mg/dL — AB (ref 65–99)
GLUCOSE-CAPILLARY: 162 mg/dL — AB (ref 65–99)
Glucose-Capillary: 119 mg/dL — ABNORMAL HIGH (ref 65–99)
Glucose-Capillary: 134 mg/dL — ABNORMAL HIGH (ref 65–99)
Glucose-Capillary: 155 mg/dL — ABNORMAL HIGH (ref 65–99)
Glucose-Capillary: 160 mg/dL — ABNORMAL HIGH (ref 65–99)
Glucose-Capillary: 93 mg/dL (ref 65–99)

## 2017-04-22 LAB — MAGNESIUM: MAGNESIUM: 1.9 mg/dL (ref 1.7–2.4)

## 2017-04-22 NOTE — Progress Notes (Signed)
PROGRESS NOTE    [name]  KZS:[id number] DOB: 10-Jul-1932 DOA: 04/07/2017 PCP: [name], MD    Brief Narrative:  Joshua Grinder Ludwickis a 84 y.o.malewith medical history significant ofafibnot on anticoagulation due to prior subdural hemorrhage,PE,hypertension, hyperlipidemia, diet-controlled diabetes, asthma, GERD, SDH, CAD,s/p ofCABGandstent placement,sCHFwith EF 35%, AICD placement, who presents with cough, chest pain and fever of 102.1. He was found to have right lower lobe infiltrates. He was admitted for HCAP complicated by influenza A infection.  He has been treated with IV antibiotics and Tamiflu.  Throughout hospitalization, he has been intermittently confused.  Swallow evaluation revealed severe dysphagia.  He was kept n.p.o.  Eventually, cortrack was placed on 3/14 for nutrition.  Palliative care consulted for goals of care     Assessment & Plan:   Principal Problem:   Evaluation by psychiatric service required Active Problems:   Chronic atrial fibrillation (HCC)   Chronic systolic heart failure- EF 35-40%   CAD (coronary artery disease)   HCAP (healthcare-associated pneumonia)   Thrombocytopenia (HCC)   Pharyngoesophageal dysphagia   ICD (implantable cardioverter-defibrillator) in place   Sepsis ([place])   Influenza A   CKD (chronic kidney disease), stage III ([place])   Acute respiratory failure with hypoxia ([place])   Malnutrition of moderate degree   Goals of care, counseling/discussion   Palliative care by specialist   DNR (do not resuscitate)   Dysphagia   Adult failure to thrive  #1 acute hypoxic respiratory failure secondary to healthcare associated pneumonia/influenza A/acute on chronic aspiration Patient improving daily. Patient with sats of 93-95% on room air.  Patient status post 5 days of empiric IV cefepime and Tamiflu.  Currently stable.  Follow.  2.  Acute on chronic severe dysphagia Patient has been assessed by speech therapist and  underwent a modified barium swallow 04/10/2017 that showed moderate aspiration risk.  N.p.o. was recommended by speech therapy.  Coretrack was placed 04/13/2017 for medications, nutrition and hydration however core track was dislodged.  Patient was seen by palliative care was recommended for PEG tube placement.  PEG tube placed 04/19/2017 per interventional radiology.  Dietitian consulted and patient started on tube feeds which are being adjusted.  Will likely need speech therapy follow-up in the outpatient setting for further evaluation of dysphasia.  3.  Acute metabolic encephalopathy Likely secondary to problem #1 with probable underlying dementia.  Likely at baseline. Follow.  4.  Chronic A. fib Rate controlled on digoxin and metoprolol. Patient not a anticoagulation candidate due to prior history of GI bleed.  Follow.  5.  Polyneuropathy Continue current regimen of gabapentin.  6.  Chronic systolic heart failure/EF 35%/status post AICD Stable.  Euvolemic.  Continue beta-blocker.    7.  Atypical chest pain Patient with complaints of left-sided chest pain overnight of 04/17/2017.  Cardiac enzymes were negative x3.  EKG with no ischemic changes noted.  No further chest pain.   10.  Goals of care Patient seen by palliative care who discussed with patient and son over the phone who are in agreement with PEG tube.  Patient status post PEG tube placement per interventional radiology 04/19/2017.  Patient initially told palliative care will be interested in inpatient rehab and then patient subsequently changed his mind and wants to go to [facility] so his friends are closer and can visit him.  Patient will likely need palliative care vs hospice to follow in the outpatient setting.  Palliative care following.   DVT prophylaxis: SCDs Code Status: DNR Family Communication:  Updated patient and neighbor at bedside. Disposition Plan: Likely skilled nursing facility with palliative care following versus hospice  following when medically stable.   Consultants:   Psychiatry: Dr. Mariea Clonts 04/18/2017  Palliative care [name], NP 04/14/2017  Inpatient rehab: Dr. Tessa Lerner 04/21/2017  Procedures:   PEG tube placement 04/19/2017 per Dr. Vernard Gambles  Chest x-ray 04/07/2017, 04/09/2017  Modified barium swallow 04/10/2017  Antimicrobials:   IV cefepime 04/08/2017>>>> 04/11/2017     Subjective: Patient in bed. Denies pain around PEG tube sites. No SOB.  No chest pain.  Objective: Vitals:   04/21/17 2012 04/22/17 0437 04/22/17 0900 04/22/17 1120  BP: 117/73 134/74 111/66 (!) 117/53  Pulse: (!) 102 82 82 81  Resp: 19 18  18   Temp: 98.3 F (36.8 C) 98.7 F (37.1 C)  (!) 97.3 F (36.3 C)  TempSrc: Oral Oral  Oral  SpO2: 93% 98%  94%  Weight:  73 kg (160 lb 15 oz)    Height:        Intake/Output Summary (Last 24 hours) at 04/22/2017 1347 Last data filed at 04/22/2017 1021 Gross per 24 hour  Intake 1000 ml  Output 1300 ml  Net -300 ml   Filed Weights   04/20/17 0339 04/21/17 0517 04/22/17 0437  Weight: 72.5 kg (159 lb 12.8 oz) 72.4 kg (159 lb 9.6 oz) 73 kg (160 lb 15 oz)    Examination:  General exam: NAD. Respiratory system: Lungs are clear to auscultation bilaterally, no wheezes, no crackles, no rhonchi.  Normal respiratory effort.   Cardiovascular system: Irregularly irregular. No murmurs rubs or gallops.  No JVD.  No lower extremity edema.  Gastrointestinal system: Abdomen is soft, nontender, nondistended, positive bowel sounds.  No rebound.  No guarding.  Central nervous system: Alert and oriented. No focal neurological deficits. Extremities: Symmetric 5 x 5 power. Skin: No rashes, lesions or ulcers Psychiatry: Judgement and insight appear poor to fair. Mood & affect appropriate.     Data Reviewed: I have personally reviewed following labs and imaging studies  CBC: Recent Labs  Lab 04/16/17 0721 04/20/17 0305 04/21/17 0538  WBC 6.5 12.4* 8.5  HGB 12.2* 12.2* 11.9*  HCT  38.5* 38.0* 37.2*  MCV 95.3 93.6 93.9  PLT 185 209 664   Basic Metabolic Panel: Recent Labs  Lab 04/17/17 0633 04/18/17 0206 04/19/17 0642 04/20/17 0305 04/21/17 0538 04/22/17 0310  NA 139 135 136 136 136  --   K 4.4 4.3 4.1 4.7 4.0  --   CL 105 100* 101 101 102  --   CO2 23 26 24 25 23   --   GLUCOSE 132* 125* 126* 110* 111*  --   BUN 12 12 11 18 16   --   CREATININE 0.84 0.81 0.84 0.95 0.86  --   CALCIUM 8.2* 8.3* 8.5* 8.4* 8.4*  --   MG 1.5* 1.7 1.5* 2.7* 2.2 1.9   GFR: Estimated Creatinine Clearance: 68.4 mL/min (by C-G formula based on SCr of 0.86 mg/dL). Liver Function Tests: No results for input(s): AST, ALT, ALKPHOS, BILITOT, PROT, ALBUMIN in the last 168 hours. No results for input(s): LIPASE, AMYLASE in the last 168 hours. No results for input(s): AMMONIA in the last 168 hours. Coagulation Profile: Recent Labs  Lab 04/18/17 1658  INR 1.11   Cardiac Enzymes: Recent Labs  Lab 04/17/17 2035 04/18/17 0206 04/18/17 0744  TROPONINI <0.03 <0.03 <0.03   BNP (last 3 results) No results for input(s): PROBNP in the last 8760 hours. HbA1C: No  results for input(s): HGBA1C in the last 72 hours. CBG: Recent Labs  Lab 04/21/17 2126 04/22/17 0010 04/22/17 0430 04/22/17 0732 04/22/17 1123  GLUCAP 107* 162* 93 134* 119*   Lipid Profile: No results for input(s): CHOL, HDL, LDLCALC, TRIG, CHOLHDL, LDLDIRECT in the last 72 hours. Thyroid Function Tests: No results for input(s): TSH, T4TOTAL, FREET4, T3FREE, THYROIDAB in the last 72 hours. Anemia Panel: No results for input(s): VITAMINB12, FOLATE, FERRITIN, TIBC, IRON, RETICCTPCT in the last 72 hours. Sepsis Labs: No results for input(s): PROCALCITON, LATICACIDVEN in the last 168 hours.  No results found for this or any previous visit (from the past 240 hour(s)).       Radiology Studies: No results found.      Scheduled Meds: . digoxin  0.125 mg Oral Daily  . docusate  50 mg Per Tube BID  . feeding  supplement (JEVITY 1.5 CAL/FIBER)  240 mL Per Tube 6 X Daily  . free water  120 mL Per Tube 6 X Daily  . guaiFENesin-dextromethorphan  15 mL Per Tube TID AC & HS  . metoprolol tartrate  25 mg Per Tube BID  .  morphine injection  2 mg Intravenous Once  . neomycin-bacitracin-polymyxin   Topical Daily  . QUEtiapine  12.5 mg Oral QHS  . sennosides  5 mL Per Tube BID   Continuous Infusions:    LOS: 15 days    Time spent: 35 mins    Irine Seal, MD [facility] Pager [phone]  If 7PM-7AM, please contact night-coverage [email or web address] Password [facility] 04/22/2017, 1:47 PM

## 2017-04-22 NOTE — Clinical Social Work Note (Signed)
Patient's son has not sent back SNF admissions paperwork yet.  [name], [place]

## 2017-04-22 NOTE — Plan of Care (Signed)
  Problem: Safety: Goal: Ability to remain free from injury will improve Outcome: Progressing   Problem: Skin Integrity: Goal: Risk for impaired skin integrity will decrease Outcome: Progressing   

## 2017-04-23 LAB — GLUCOSE, CAPILLARY
GLUCOSE-CAPILLARY: 159 mg/dL — AB (ref 65–99)
GLUCOSE-CAPILLARY: 112 mg/dL — AB (ref 65–99)
Glucose-Capillary: 90 mg/dL (ref 65–99)
Glucose-Capillary: 146 mg/dL — ABNORMAL HIGH (ref 65–99)
Glucose-Capillary: 160 mg/dL — ABNORMAL HIGH (ref 65–99)

## 2017-04-23 LAB — CBC
HCT: 36.4 % — ABNORMAL LOW (ref 39.0–52.0)
Hemoglobin: 11.6 g/dL — ABNORMAL LOW (ref 13.0–17.0)
MCH: 30.1 pg (ref 26.0–34.0)
MCHC: 31.9 g/dL (ref 30.0–36.0)
MCV: 94.5 fL (ref 78.0–100.0)
PLATELETS: 252 10*3/uL (ref 150–400)
RBC: 3.85 MIL/uL — ABNORMAL LOW (ref 4.22–5.81)
RDW: 16.9 % — ABNORMAL HIGH (ref 11.5–15.5)
WBC: 7.1 10*3/uL (ref 4.0–10.5)

## 2017-04-23 LAB — BASIC METABOLIC PANEL
Anion gap: 9 (ref 5–15)
BUN: 15 mg/dL (ref 6–20)
CO2: 25 mmol/L (ref 22–32)
CREATININE: 0.85 mg/dL (ref 0.61–1.24)
Calcium: 8.6 mg/dL — ABNORMAL LOW (ref 8.9–10.3)
Chloride: 103 mmol/L (ref 101–111)
GFR calc Af Amer: >60 mL/min (ref >60)
Glucose, Bld: 165 mg/dL — ABNORMAL HIGH (ref 65–99)
Potassium: 3.9 mmol/L (ref 3.5–5.1)
SODIUM: 137 mmol/L (ref 135–145)

## 2017-04-23 LAB — MAGNESIUM: MAGNESIUM: 1.7 mg/dL (ref 1.7–2.4)

## 2017-04-23 MED ORDER — OLANZAPINE 5 MG PO TBDP
5.0000 mg | ORAL_TABLET | Freq: Every day | ORAL | Status: DC
  Administered 2017-04-23 – 2017-04-25 (×3): 5 mg via ORAL
  Filled 2017-04-23 (×4): qty 1

## 2017-04-23 MED ORDER — MAGNESIUM SULFATE 4 GM/100ML IV SOLN
4.0000 g | Freq: Once | INTRAVENOUS | Status: AC
  Administered 2017-04-23: 4 g via INTRAVENOUS
  Filled 2017-04-23: qty 100

## 2017-04-23 NOTE — Plan of Care (Signed)
  Problem: Health Behavior/Discharge Planning: Goal: Ability to manage health-related needs will improve Outcome: Progressing   Problem: Clinical Measurements: Goal: Respiratory complications will improve Outcome: Progressing   Problem: Nutrition: Goal: Adequate nutrition will be maintained Outcome: Progressing   Problem: Elimination: Goal: Will not experience complications related to bowel motility Outcome: Progressing   Problem: Safety: Goal: Ability to remain free from injury will improve Outcome: Progressing   Problem: Skin Integrity: Goal: Risk for impaired skin integrity will decrease Outcome: Progressing

## 2017-04-23 NOTE — Progress Notes (Signed)
PROGRESS NOTE    Joshua CRATTY  NOB:[id number] DOB: 1932-04-14 DOA: 04/07/2017 PCP: [name], MD    Brief Narrative:  [name] a 84 y.o.malewith medical history significant ofafibnot on anticoagulation due to prior subdural hemorrhage,PE,hypertension, hyperlipidemia, diet-controlled diabetes, asthma, GERD, SDH, CAD,s/p ofCABGandstent placement,sCHFwith EF 35%, AICD placement, who presents with cough, chest pain and fever of 102.1. He was found to have right lower lobe infiltrates. He was admitted for HCAP complicated by influenza A infection.  He has been treated with IV antibiotics and Tamiflu.  Throughout hospitalization, he has been intermittently confused.  Swallow evaluation revealed severe dysphagia.  He was kept n.p.o.  Eventually, cortrack was placed on 3/14 for nutrition.  Palliative care consulted for goals of care     Assessment & Plan:   Principal Problem:   Evaluation by psychiatric service required Active Problems:   Chronic atrial fibrillation (HCC)   Chronic systolic heart failure- EF 35-40%   CAD (coronary artery disease)   HCAP (healthcare-associated pneumonia)   Thrombocytopenia (HCC)   Pharyngoesophageal dysphagia   ICD (implantable cardioverter-defibrillator) in place   Sepsis ([place])   Influenza A   CKD (chronic kidney disease), stage III ([place])   Acute respiratory failure with hypoxia ([place])   Malnutrition of moderate degree   Goals of care, counseling/discussion   Palliative care by specialist   DNR (do not resuscitate)   Dysphagia   Adult failure to thrive  #1 acute hypoxic respiratory failure secondary to healthcare associated pneumonia/influenza A/acute on chronic aspiration Patient clinically improved.  Likely at baseline.  Status post 5 days of empiric IV cefepime and Tamiflu.  Satting 99% on room air.   2.  Acute on chronic severe dysphagia Patient has been assessed by speech therapist and underwent a modified barium  swallow 04/10/2017 that showed moderate aspiration risk.  N.p.o. was recommended by speech therapy.  Coretrack was placed 04/13/2017 for medications, nutrition and hydration however core track was dislodged.  Patient was seen by palliative care was recommended for PEG tube placement.  PEG tube placed 04/19/2017 per interventional radiology.  Dietitian consulted and patient started on tube feeds which are being adjusted.  Will likely need speech therapy follow-up in the outpatient setting for further evaluation of dysphasia.  3.  Acute metabolic encephalopathy Likely secondary to problem #1 with probable underlying dementia.  Likely at baseline.  Patient noted to be agitated overnight.  Patient was on Seroquel however not getting it scheduled through PEG tube.  Will discontinue Seroquel.  Place on Zyprexa disintegrating tablet nightly.    4.  Chronic A. fib Rate controlled on digoxin and metoprolol. Patient not a anticoagulation candidate due to prior history of GI bleed.  Follow.  5.  Polyneuropathy Continue  gabapentin.  6.  Chronic systolic heart failure/EF 35%/status post AICD Currently euvolemic.  Continue digoxin, metoprolol.  7.  Atypical chest pain Patient with complaints of left-sided chest pain the night of 04/17/2017.  Cardiac enzymes were negative x3.  EKG with no ischemic changes noted.  No further chest pain.  Outpatient follow-up.  10.  Goals of care Patient seen by palliative care who discussed with patient and son over the phone who are in agreement with PEG tube.  Patient status post PEG tube placement per interventional radiology 04/19/2017.  Patient initially told palliative care will be interested in inpatient rehab and then patient subsequently changed his mind and wants to go to [facility] so his friends are closer and can visit him.  Patient will likely need palliative care vs hospice to follow in the outpatient setting.  Palliative care following.   DVT prophylaxis: SCDs Code  Status: DNR Family Communication: Updated patient.  No family at bedside.  Disposition Plan: Likely skilled nursing facility with palliative care following versus hospice following hopefully in the next 24-48 hours.    Consultants:   Psychiatry: Dr. Mariea Clonts 04/18/2017  Palliative care [name], NP 04/14/2017  Inpatient rehab: Dr. Tessa Lerner 04/21/2017  Procedures:   PEG tube placement 04/19/2017 per Dr. Vernard Gambles  Chest x-ray 04/07/2017, 04/09/2017  Modified barium swallow 04/10/2017  Antimicrobials:   IV cefepime 04/08/2017>>>> 04/11/2017     Subjective: Patient denies any further pain around PEG tube site.  No chest pain or shortness of breath.  Patient asking when he is going to be discharged.  Patient noted to be agitated overnight.  Objective: Vitals:   04/23/17 0152 04/23/17 0321 04/23/17 0901 04/23/17 1133  BP:  (!) 149/89 115/66 115/81  Pulse:  89 73 72  Resp:  18  17  Temp:  (!) 97.4 F (36.3 C)  97.6 F (36.4 C)  TempSrc:  Oral  Oral  SpO2:  98%  99%  Weight: 73.7 kg (162 lb 6.4 oz)     Height:        Intake/Output Summary (Last 24 hours) at 04/23/2017 1328 Last data filed at 04/23/2017 2542 Gross per 24 hour  Intake 1040 ml  Output 1826 ml  Net -786 ml   Filed Weights   04/21/17 0517 04/22/17 0437 04/23/17 0152  Weight: 72.4 kg (159 lb 9.6 oz) 73 kg (160 lb 15 oz) 73.7 kg (162 lb 6.4 oz)    Examination:  General exam: NAD. Respiratory system: Lungs CTAB. Normal respiratory effort.   Cardiovascular system: Irregularly irregular. No murmurs rubs or gallops.  No JVD.  No lower extremity edema.  Gastrointestinal system: Abdomen is tender, nondistended, soft, positive bowel sounds.  No guarding.  No rebound.  Central nervous system: Alert and oriented. No focal neurological deficits. Extremities: Symmetric 5 x 5 power. Skin: No rashes, lesions or ulcers Psychiatry: Judgement and insight appear poor to fair. Mood & affect appropriate.     Data Reviewed:  I have personally reviewed following labs and imaging studies  CBC: Recent Labs  Lab 04/20/17 0305 04/21/17 0538 04/23/17 0655  WBC 12.4* 8.5 7.1  HGB 12.2* 11.9* 11.6*  HCT 38.0* 37.2* 36.4*  MCV 93.6 93.9 94.5  PLT 209 235 706   Basic Metabolic Panel: Recent Labs  Lab 04/18/17 0206 04/19/17 0642 04/20/17 0305 04/21/17 0538 04/22/17 0310 04/23/17 0655  NA 135 136 136 136  --  137  K 4.3 4.1 4.7 4.0  --  3.9  CL 100* 101 101 102  --  103  CO2 26 24 25 23   --  25  GLUCOSE 125* 126* 110* 111*  --  165*  BUN 12 11 18 16   --  15  CREATININE 0.81 0.84 0.95 0.86  --  0.85  CALCIUM 8.3* 8.5* 8.4* 8.4*  --  8.6*  MG 1.7 1.5* 2.7* 2.2 1.9 1.7   GFR: Estimated Creatinine Clearance: 69.8 mL/min (by C-G formula based on SCr of 0.85 mg/dL). Liver Function Tests: No results for input(s): AST, ALT, ALKPHOS, BILITOT, PROT, ALBUMIN in the last 168 hours. No results for input(s): LIPASE, AMYLASE in the last 168 hours. No results for input(s): AMMONIA in the last 168 hours. Coagulation Profile: Recent Labs  Lab 04/18/17 1658  INR  1.11   Cardiac Enzymes: Recent Labs  Lab 04/17/17 2035 04/18/17 0206 04/18/17 0744  TROPONINI <0.03 <0.03 <0.03   BNP (last 3 results) No results for input(s): PROBNP in the last 8760 hours. HbA1C: No results for input(s): HGBA1C in the last 72 hours. CBG: Recent Labs  Lab 04/22/17 2006 04/22/17 2349 04/23/17 0501 04/23/17 0736 04/23/17 1136  GLUCAP 131* 160* 90 146* 160*   Lipid Profile: No results for input(s): CHOL, HDL, LDLCALC, TRIG, CHOLHDL, LDLDIRECT in the last 72 hours. Thyroid Function Tests: No results for input(s): TSH, T4TOTAL, FREET4, T3FREE, THYROIDAB in the last 72 hours. Anemia Panel: No results for input(s): VITAMINB12, FOLATE, FERRITIN, TIBC, IRON, RETICCTPCT in the last 72 hours. Sepsis Labs: No results for input(s): PROCALCITON, LATICACIDVEN in the last 168 hours.  No results found for this or any previous visit  (from the past 240 hour(s)).       Radiology Studies: No results found.      Scheduled Meds: . digoxin  0.125 mg Oral Daily  . docusate  50 mg Per Tube BID  . feeding supplement (JEVITY 1.5 CAL/FIBER)  240 mL Per Tube 6 X Daily  . free water  120 mL Per Tube 6 X Daily  . guaiFENesin-dextromethorphan  15 mL Per Tube TID AC & HS  . metoprolol tartrate  25 mg Per Tube BID  .  morphine injection  2 mg Intravenous Once  . neomycin-bacitracin-polymyxin   Topical Daily  . OLANZapine zydis  5 mg Oral QHS  . sennosides  5 mL Per Tube BID   Continuous Infusions:    LOS: 16 days    Time spent: 35 mins    Irine Seal, MD [facility] Pager [phone]  If 7PM-7AM, please contact night-coverage [email or web address] Password [email or web address] 04/23/2017, 1:28 PM

## 2017-04-23 NOTE — Progress Notes (Signed)
Clinical Social Worker following patient for discharge needs. CSW reached out to the admission coordinator for [facility]) she stated that paperwork has been emailed to patients son and she is waiting for him to send paperwork back.  CSW reached out to patients son Damond Borchers.) and he stated he has not receive any paperwork from the facility. CSW verified his e-mail address ([email or web address]) with son. Admission Coordinator of facility stated she will resend paperwork to the verified email address.   Rhea Pink, MSW,  [place]

## 2017-04-23 NOTE — Progress Notes (Signed)
Patient constantly trying to getting up without assistance. Patient educated on safety. Patient barely gets any sleep at night.

## 2017-04-24 LAB — GLUCOSE, CAPILLARY
GLUCOSE-CAPILLARY: 126 mg/dL — AB (ref 65–99)
GLUCOSE-CAPILLARY: 132 mg/dL — AB (ref 65–99)
Glucose-Capillary: 98 mg/dL (ref 65–99)
Glucose-Capillary: 191 mg/dL — ABNORMAL HIGH (ref 65–99)
Glucose-Capillary: 106 mg/dL — ABNORMAL HIGH (ref 65–99)

## 2017-04-24 LAB — BASIC METABOLIC PANEL
ANION GAP: 13 (ref 5–15)
BUN: 15 mg/dL (ref 6–20)
CALCIUM: 8.6 mg/dL — AB (ref 8.9–10.3)
CHLORIDE: 100 mmol/L — AB (ref 101–111)
CO2: 25 mmol/L (ref 22–32)
CREATININE: 0.81 mg/dL (ref 0.61–1.24)
GFR calc Af Amer: >60 mL/min (ref >60)
GFR calc non Af Amer: >60 mL/min (ref >60)
Glucose, Bld: 147 mg/dL — ABNORMAL HIGH (ref 65–99)
Potassium: 4.1 mmol/L (ref 3.5–5.1)
SODIUM: 138 mmol/L (ref 135–145)

## 2017-04-24 LAB — MAGNESIUM: Magnesium: 2.1 mg/dL (ref 1.7–2.4)

## 2017-04-24 NOTE — Progress Notes (Signed)
PROGRESS NOTE    BJ MORLOCK  GBT:[id number] DOB: 06/01/1932 DOA: 04/07/2017 PCP: [name], MD    Brief Narrative:  [name] a 84 y.o.malewith medical history significant ofafibnot on anticoagulation due to prior subdural hemorrhage,PE,hypertension, hyperlipidemia, diet-controlled diabetes, asthma, GERD, SDH, CAD,s/p ofCABGandstent placement,sCHFwith EF 35%, AICD placement, who presents with cough, chest pain and fever of 102.1. He was found to have right lower lobe infiltrates. He was admitted for HCAP complicated by influenza A infection.  He has been treated with IV antibiotics and Tamiflu.  Throughout hospitalization, he has been intermittently confused.  Swallow evaluation revealed severe dysphagia.  He was kept n.p.o.  Eventually, cortrack was placed on 3/14 for nutrition.  Palliative care consulted for goals of care     Assessment & Plan:   Principal Problem:   Evaluation by psychiatric service required Active Problems:   Chronic atrial fibrillation (HCC)   Chronic systolic heart failure- EF 35-40%   CAD (coronary artery disease)   HCAP (healthcare-associated pneumonia)   Thrombocytopenia (HCC)   Pharyngoesophageal dysphagia   ICD (implantable cardioverter-defibrillator) in place   Sepsis ([place])   Influenza A   CKD (chronic kidney disease), stage III ([place])   Acute respiratory failure with hypoxia ([place])   Malnutrition of moderate degree   Goals of care, counseling/discussion   Palliative care by specialist   DNR (do not resuscitate)   Dysphagia   Adult failure to thrive  #1 acute hypoxic respiratory failure secondary to healthcare associated pneumonia/influenza A/acute on chronic aspiration Patient improved.  Likely at baseline. Status post 5 days of empiric IV cefepime and Tamiflu.   2.  Acute on chronic severe dysphagia Patient has been assessed by speech therapist and underwent a modified barium swallow 04/10/2017 that showed moderate  aspiration risk.  N.p.o. was recommended by speech therapy.  Coretrack was placed 04/13/2017 for medications, nutrition and hydration however core track was dislodged.  Patient was seen by palliative care was recommended for PEG tube placement.  PEG tube placed 04/19/2017 per interventional radiology.  Dietitian consulted and patient started on tube feeds which are being adjusted.  Outpatient follow-up with speech therapy for further evaluation of dysphagia.    3.  Acute metabolic encephalopathy Likely secondary to problem #1 with probable underlying dementia.  Likely at baseline.  Patient noted to be agitated the night of 04/22/2017.  Patient was on Seroquel however not getting it scheduled through PEG tube.  Patient was started on Zyprexa disintegrating tablet nightly.     4.  Chronic A. fib Continue digoxin and metoprolol for rate control.  Patient not a anticoagulation candidate secondary to prior history of GI bleed.  5.  Polyneuropathy Continue  gabapentin.  6.  Chronic systolic heart failure/EF 35%/status post AICD Compensated.  Continue metoprolol and digoxin.   7.  Atypical chest pain Patient with complaints of left-sided chest pain the night of 04/17/2017.  Cardiac enzymes were negative x3.  EKG with no ischemic changes noted.  No further chest pain.  Outpatient follow-up.  10.  Goals of care Patient seen by palliative care who discussed with patient and son over the phone who are in agreement with PEG tube.  Patient status post PEG tube placement per interventional radiology 04/19/2017.  Patient initially told palliative care will be interested in inpatient rehab and then patient subsequently changed his mind and wants to go to [facility] so his friends are closer and can visit him.  Patient will likely need palliative care vs hospice  to follow in the outpatient setting.  Palliative care following.   DVT prophylaxis: SCDs Code Status: DNR Family Communication: Updated patient.  No family at  bedside.  Disposition Plan: Skilled nursing facility with palliative care following versus hospice following when bed available.     Consultants:   Psychiatry: Dr. Mariea Clonts 04/18/2017  Palliative care [name], NP 04/14/2017  Inpatient rehab: Dr. Tessa Lerner 04/21/2017  Procedures:   PEG tube placement 04/19/2017 per Dr. Vernard Gambles  Chest x-ray 04/07/2017, 04/09/2017  Modified barium swallow 04/10/2017  Antimicrobials:   IV cefepime 04/08/2017>>>> 04/11/2017     Subjective: Patient sitting up in chair by the window.  Denies any chest pain or shortness of breath.  Tolerating PEG tube feeds.  Patient asking when he is going to go to skilled nursing facility.   Objective: Vitals:   04/23/17 0901 04/23/17 1133 04/23/17 2046 04/24/17 0436  BP: 115/66 115/81 (!) 145/71   Pulse: 73 72 91   Resp:  17 18   Temp:  97.6 F (36.4 C) 98.1 F (36.7 C)   TempSrc:  Oral Oral   SpO2:  99% 95%   Weight:    74.1 kg (163 lb 6.4 oz)  Height:        Intake/Output Summary (Last 24 hours) at 04/24/2017 1303 Last data filed at 04/24/2017 1100 Gross per 24 hour  Intake 1320 ml  Output 2250 ml  Net -930 ml   Filed Weights   04/22/17 0437 04/23/17 0152 04/24/17 0436  Weight: 73 kg (160 lb 15 oz) 73.7 kg (162 lb 6.4 oz) 74.1 kg (163 lb 6.4 oz)    Examination:  General exam: NAD. Respiratory system: Lungs to auscultation bilaterally.  No wheezes, no crackles, no rhonchi.  Normal respiratory effort.   Cardiovascular system: Irregularly irregular.  No lower extremity edema.  No JVD.  Gastrointestinal system: Abdomen is soft, nontender, nondistended, positive bowel sounds.  No rebound.  No guarding.   Central nervous system: Alert and oriented. No focal neurological deficits. Extremities: Symmetric 5 x 5 power. Skin: No rashes, lesions or ulcers Psychiatry: Judgement and insight appear fair. Mood & affect appropriate.     Data Reviewed: I have personally reviewed following labs and imaging  studies  CBC: Recent Labs  Lab 04/20/17 0305 04/21/17 0538 04/23/17 0655  WBC 12.4* 8.5 7.1  HGB 12.2* 11.9* 11.6*  HCT 38.0* 37.2* 36.4*  MCV 93.6 93.9 94.5  PLT 209 235 009   Basic Metabolic Panel: Recent Labs  Lab 04/19/17 0642 04/20/17 0305 04/21/17 0538 04/22/17 0310 04/23/17 0655 04/24/17 0646  NA 136 136 136  --  137 138  K 4.1 4.7 4.0  --  3.9 4.1  CL 101 101 102  --  103 100*  CO2 24 25 23   --  25 25  GLUCOSE 126* 110* 111*  --  165* 147*  BUN 11 18 16   --  15 15  CREATININE 0.84 0.95 0.86  --  0.85 0.81  CALCIUM 8.5* 8.4* 8.4*  --  8.6* 8.6*  MG 1.5* 2.7* 2.2 1.9 1.7 2.1   GFR: Estimated Creatinine Clearance: 73.7 mL/min (by C-G formula based on SCr of 0.81 mg/dL). Liver Function Tests: No results for input(s): AST, ALT, ALKPHOS, BILITOT, PROT, ALBUMIN in the last 168 hours. No results for input(s): LIPASE, AMYLASE in the last 168 hours. No results for input(s): AMMONIA in the last 168 hours. Coagulation Profile: Recent Labs  Lab 04/18/17 1658  INR 1.11   Cardiac Enzymes: Recent  Labs  Lab 04/17/17 2035 04/18/17 0206 04/18/17 0744  TROPONINI <0.03 <0.03 <0.03   BNP (last 3 results) No results for input(s): PROBNP in the last 8760 hours. HbA1C: No results for input(s): HGBA1C in the last 72 hours. CBG: Recent Labs  Lab 04/23/17 1700 04/23/17 2041 04/24/17 0510 04/24/17 0745 04/24/17 1123  GLUCAP 159* 112* 98 191* 106*   Lipid Profile: No results for input(s): CHOL, HDL, LDLCALC, TRIG, CHOLHDL, LDLDIRECT in the last 72 hours. Thyroid Function Tests: No results for input(s): TSH, T4TOTAL, FREET4, T3FREE, THYROIDAB in the last 72 hours. Anemia Panel: No results for input(s): VITAMINB12, FOLATE, FERRITIN, TIBC, IRON, RETICCTPCT in the last 72 hours. Sepsis Labs: No results for input(s): PROCALCITON, LATICACIDVEN in the last 168 hours.  No results found for this or any previous visit (from the past 240 hour(s)).       Radiology  Studies: No results found.      Scheduled Meds: . digoxin  0.125 mg Oral Daily  . docusate  50 mg Per Tube BID  . feeding supplement (JEVITY 1.5 CAL/FIBER)  240 mL Per Tube 6 X Daily  . free water  120 mL Per Tube 6 X Daily  . guaiFENesin-dextromethorphan  15 mL Per Tube TID AC & HS  . metoprolol tartrate  25 mg Per Tube BID  .  morphine injection  2 mg Intravenous Once  . neomycin-bacitracin-polymyxin   Topical Daily  . OLANZapine zydis  5 mg Oral QHS  . sennosides  5 mL Per Tube BID   Continuous Infusions:    LOS: 17 days    Time spent: 35 mins    Irine Seal, MD [facility] Pager [phone]  If 7PM-7AM, please contact night-coverage [email or web address] Password [facility] 04/24/2017, 1:03 PM

## 2017-04-24 NOTE — Clinical Social Work Note (Signed)
CSW left a voicemail for patient's son to see if he received the SNF admissions paperwork that was emailed to him yesterday.  Dayton Scrape, [place]

## 2017-04-24 NOTE — Care Management Important Message (Signed)
Important Message  Patient Details  Name: [name] MRN: [id number] Date of Birth: 19-Aug-1932   Medicare Important Message Given:  Yes    Clete Kuch P Winston Misner 04/24/2017, 2:20 PM

## 2017-04-24 NOTE — Plan of Care (Signed)
Pt. Alert to self. Needs frequent reorienting for safety. Pt. Impulsive and attempts to get out of bed unassisted. Pt. Is high fall risk. Low bed and safety mats in place. Bed alarm activated. RN will continue to monitor.

## 2017-04-24 NOTE — Progress Notes (Signed)
Physical Therapy Treatment Patient Details Name: [name] MRN: [id number] DOB: September 25, 1932 Today's Date: 04/24/2017    History of Present Illness 84 y.o. male with medical history significant of afib not on anticoagulation due to prior subdural hemorrhage, PE, hypertension, hyperlipidemia, diet-controlled diabetes, asthma, GERD, SDH, CAD, s/p of CABG and stent placement, sCHF with EF 35%, AICD placement, who presents with cough, chest pain and fever.  Admitted for HCAP complicated by influenza A infection.    PT Comments    Patient received in bed, pleasant and willing to work with skilled PT services today. His mobility appears to be improving significantly, and he also was able to consistently apply correct safety awareness during transfers and gait today with only occasional cues. Able to ambulate approximately 73f today with RW but limited by fatigue. He was left up in the recliner chair with belt alarm applied, all needs otherwise met this morning.     Follow Up Recommendations  SNF;Supervision/Assistance - 24 hour     Equipment Recommendations  None recommended by PT    Recommendations for Other Services OT consult     Precautions / Restrictions Precautions Precautions: Fall;Other (comment) Precaution Comments: Hx of falls at home, PEG tube placement on 3/20 Required Braces or Orthoses: Other Brace/Splint Other Brace/Splint: abdominal binder Restrictions Weight Bearing Restrictions: No    Mobility  Bed Mobility Overal bed mobility: Needs Assistance Bed Mobility: Supine to Sit     Supine to sit: Supervision     General bed mobility comments: S for safety and extended time   Transfers Overall transfer level: Needs assistance Equipment used: Rolling walker (2 wheeled) Transfers: Sit to/from Stand Sit to Stand: Min guard         General transfer comment: safety awareness is improving today, only requires occasional cues for safety    Ambulation/Gait Ambulation/Gait assistance: Min guard Ambulation Distance (Feet): 50 Feet Assistive device: Rolling walker (2 wheeled) Gait Pattern/deviations: Step-through pattern;Shuffle;Trunk flexed     General Gait Details: patient with much improved safety and mobility with RW today, gait distance limited by fatigue today however    Stairs            Wheelchair Mobility    Modified Rankin (Stroke Patients Only)       Balance Overall balance assessment: Needs assistance Sitting-balance support: No upper extremity supported;Feet supported Sitting balance-Leahy Scale: Good     Standing balance support: During functional activity;Bilateral upper extremity supported Standing balance-Leahy Scale: Fair Standing balance comment: able to statically satnd with S-Min guard                             Cognition Arousal/Alertness: Awake/alert Behavior During Therapy: WFL for tasks assessed/performed   Area of Impairment: Orientation                 Orientation Level: Time;Place;Situation   Memory: Decreased short-term memory Following Commands: Follows one step commands consistently Safety/Judgement: Decreased awareness of safety;Decreased awareness of deficits Awareness: Anticipatory Problem Solving: Requires verbal cues General Comments: appropriate today but confused      Exercises      General Comments        Pertinent Vitals/Pain Pain Assessment: 0-10 Pain Score: 5  Pain Location: stomach in general  Pain Descriptors / Indicators: Aching Pain Intervention(s): Limited activity within patient's tolerance;Repositioned;Monitored during session    Home Living  Prior Function            PT Goals (current goals can now be found in the care plan section) Acute Rehab PT Goals Patient Stated Goal: feel better PT Goal Formulation: With patient Time For Goal Achievement: 04/24/17 Potential to Achieve  Goals: Good Progress towards PT goals: Progressing toward goals    Frequency    Min 3X/week      PT Plan Current plan remains appropriate    Co-evaluation              AM-PAC PT "6 Clicks" Daily Activity  Outcome Measure  Difficulty turning over in bed (including adjusting bedclothes, sheets and blankets)?: None Difficulty moving from lying on back to sitting on the side of the bed? : None Difficulty sitting down on and standing up from a chair with arms (e.g., wheelchair, bedside commode, etc,.)?: None Help needed moving to and from a bed to chair (including a wheelchair)?: A Little Help needed walking in hospital room?: A Little Help needed climbing 3-5 steps with a railing? : A Lot 6 Click Score: 20    End of Session Equipment Utilized During Treatment: Gait belt Activity Tolerance: Patient tolerated treatment well Patient left: in chair;with call bell/phone within reach;with chair alarm set   PT Visit Diagnosis: Unsteadiness on feet (R26.81);Muscle weakness (generalized) (M62.81);History of falling (Z91.81)     Time: 4287-6811 PT Time Calculation (min) (ACUTE ONLY): 23 min  Charges:  $Gait Training: 8-22 mins $Therapeutic Activity: 8-22 mins                    G Codes:       Deniece Ree PT, DPT, CBIS  Supplemental Physical Therapist [place]   Pager [phone]

## 2017-04-25 ENCOUNTER — Ambulatory Visit: Payer: Self-pay | Admitting: Internal Medicine

## 2017-04-25 LAB — GLUCOSE, CAPILLARY
GLUCOSE-CAPILLARY: 150 mg/dL — AB (ref 65–99)
GLUCOSE-CAPILLARY: 214 mg/dL — AB (ref 65–99)
Glucose-Capillary: 120 mg/dL — ABNORMAL HIGH (ref 65–99)
Glucose-Capillary: 129 mg/dL — ABNORMAL HIGH (ref 65–99)
Glucose-Capillary: 149 mg/dL — ABNORMAL HIGH (ref 65–99)
Glucose-Capillary: 98 mg/dL (ref 65–99)

## 2017-04-25 LAB — MAGNESIUM: MAGNESIUM: 1.9 mg/dL (ref 1.7–2.4)

## 2017-04-25 NOTE — Progress Notes (Signed)
Nutrition Follow-up  DOCUMENTATION CODES:   Non-severe (moderate) malnutrition in context of chronic illness  INTERVENTION:   -Continue bolus feedings to 240 ml Jevity 1.5via PEG 6 times daily  60 ml free water flush before and after each feeding administration  Tube feeding regimen provides2160kcal (>100% of needs),92grams of protein, and 1067m of H2O. (Total free water: 1815 ml)  NUTRITION DIAGNOSIS:   Moderate Malnutrition related to chronic illness(CHF, SDH) as evidenced by mild fat depletion, moderate fat depletion, mild muscle depletion, moderate muscle depletion, percent weight loss.  Ongoing  GOAL:   Patient will meet greater than or equal to 90% of their needs  Met with TF  MONITOR:   Diet advancement, Weight trends, TF tolerance, Skin, I & O's  REASON FOR ASSESSMENT:   Consult Assessment of nutrition requirement/status, Enteral/tube feeding initiation and management  ASSESSMENT:   RRAI SEVERNSis a 84y.o. male with medical history significant of afib not on anticoagulation due to prior subdural hemorrhage, PE, hypertension, hyperlipidemia, diet-controlled diabetes, asthma, GERD, SDH, CAD, s/p of CABG and stent placement, sCHF with EF 35%, AICD placement, who presents with cough, chest pain or fever.  3/10- s/p BSE- recommend NPO 3/11- s/p MBSS- recommend continue NPO 3/12- s/p repeat BSE- recommend continued NPO with nutrition via alternative needs 3/14- cortrak tube placed; tip of tube confirmed post-pyloric, TF initiated 3/19- repeat BSE, recommended continued NPO with nutrition via alternative means; TF stopped PM due to clogged tube 3/20- PEG placed today via IR, cortrak tube removed 3/21- bolus feedings initiated  Case discussed with RN; pt is tolerating TF well and has improved greatly since TF regimen was adjusted on 04/21/17 (6 boluses daily).   Per CSW notes, pt for SNF placement at discharge.  Labs reviewed: CBGS: 132-214.   Diet  Order:  Fall precautions  EDUCATION NEEDS:   Education needs have been addressed  Skin:  Skin Assessment: Skin Integrity Issues: Skin Integrity Issues:: Other (Comment) Other: skin tear rt and lt elbows  Last BM:  04/23/17  Height:   Ht Readings from Last 1 Encounters:  04/07/17 '6\' 1"'  (1.854 m)    Weight:   Wt Readings from Last 1 Encounters:  04/25/17 161 lb 1.6 oz (73.1 kg)    Ideal Body Weight:  83.6 kg  BMI:  Body mass index is 21.25 kg/m.  Estimated Nutritional Needs:   Kcal:  1850-2050  Protein:  90-105 grams  Fluid:  1.8-2.0 L    Meg Niemeier A. WJimmye Norman RD, LDN, CDE Pager: [phone] hours Pager: [phone]

## 2017-04-25 NOTE — Plan of Care (Signed)
  Problem: Safety: Goal: Ability to remain free from injury will improve Outcome: Not Progressing  Pt. Confused, high fall risk, impulsive, continues to get out of bed unassisted.

## 2017-04-25 NOTE — Clinical Social Work Note (Addendum)
Per clinical liaison for [facility]), patient's son emailed them back last night at 1:00 am and stated he had some questions about the contract. He will call them around 10:15 am (assuming his time in [place]) to discuss contract.  Dayton Scrape, CSW [phone]  4:00 pm SNF has not heard from patient's son. Clinical liaison is going to try calling him.  Dayton Scrape, [place]

## 2017-04-25 NOTE — Progress Notes (Signed)
PROGRESS NOTE    [name]  GUR:[id number] DOB: [birth year] DOA: 04/07/2017 PCP: [name], MD    Brief Narrative:  Joshua Grinder Ludwickis a 84 y.o.malewith medical history significant ofafibnot on anticoagulation due to prior subdural hemorrhage,PE,hypertension, hyperlipidemia, diet-controlled diabetes, asthma, GERD, SDH, CAD,s/p ofCABGandstent placement,sCHFwith EF 35%, AICD placement, who presents with cough, chest pain and fever of 102.1. He was found to have right lower lobe infiltrates. He was admitted for HCAP complicated by influenza A infection.  He has been treated with IV antibiotics and Tamiflu.  Throughout hospitalization, he has been intermittently confused.  Swallow evaluation revealed severe dysphagia.  He was kept n.p.o.  Eventually, cortrack was placed on 3/14 for nutrition.  Palliative care consulted for goals of care     Assessment & Plan:   Principal Problem:   Evaluation by psychiatric service required Active Problems:   Chronic atrial fibrillation (HCC)   Chronic systolic heart failure- EF 35-40%   CAD (coronary artery disease)   HCAP (healthcare-associated pneumonia)   Thrombocytopenia (HCC)   Pharyngoesophageal dysphagia   ICD (implantable cardioverter-defibrillator) in place   Sepsis ([name])   Influenza A   CKD (chronic kidney disease), stage III ([name])   Acute respiratory failure with hypoxia ([name])   Malnutrition of moderate degree   Goals of care, counseling/discussion   Palliative care by specialist   DNR (do not resuscitate)   Dysphagia   Adult failure to thrive  #1 acute hypoxic respiratory failure secondary to healthcare associated pneumonia/influenza A/acute on chronic aspiration Patient clinically improved and at baseline.  Patient with sats of 95-97% on room air.  Status post 5 days of empiric IV cefepime and Tamiflu.   2.  Acute on chronic severe dysphagia Patient has been assessed by speech therapist and underwent a  modified barium swallow 04/10/2017 that showed moderate aspiration risk.  N.p.o. was recommended by speech therapy.  Coretrack was placed 04/13/2017 for medications, nutrition and hydration however core track was dislodged.  Patient was seen by palliative care was recommended for PEG tube placement.  PEG tube placed 04/19/2017 per interventional radiology.  Dietitian consulted and patient started on tube feeds which are being adjusted.  Outpatient follow-up with speech therapy for further evaluation of dysphagia and possible advancement of diet.    3.  Acute metabolic encephalopathy Likely secondary to problem #1 with probable underlying dementia.  Mentation probably at baseline now.  Patient noted to be agitated the night of 04/22/2017.  Patient was on Seroquel however not getting it scheduled through PEG tube.  Request has been discontinued and patient started on Zyprexa disintegrating tablet nightly.     4.  Chronic A. fib On digoxin and metoprolol for rate control.  Patient not a anticoagulation candidate secondary to history of GI bleed.   5.  Polyneuropathy Stable.  On gabapentin.    6.  Chronic systolic heart failure/EF 35%/status post AICD Compensated.  Continue current regimen of metoprolol and digoxin.   7.  Atypical chest pain Patient with complaints of left-sided chest pain the night of 04/17/2017.  Cardiac enzymes were negative x3.  EKG with no ischemic changes noted.  No further chest pain.  Outpatient follow-up.  10.  Goals of care Patient seen by palliative care who discussed with patient and son over the phone who are in agreement with PEG tube.  Patient status post PEG tube placement per interventional radiology 04/19/2017.  Patient initially told palliative care will be interested in inpatient rehab and then patient subsequently  changed his mind and wants to go to [place] so his friends are closer and can visit him.  Patient will likely need palliative care vs hospice to follow in the  outpatient setting.  Palliative care following.   DVT prophylaxis: SCDs Code Status: DNR Family Communication: Updated patient.  No family at bedside.  Disposition Plan: Skilled nursing facility with palliative care following versus hospice following when bed available.  Patient medically stable.   Consultants:   Psychiatry: Dr. Mariea Clonts 04/18/2017  Palliative care [name], NP 04/14/2017  Inpatient rehab: Dr. Tessa Lerner 04/21/2017  Procedures:   PEG tube placement 04/19/2017 per Dr. Vernard Gambles  Chest x-ray 04/07/2017, 04/09/2017  Modified barium swallow 04/10/2017  Antimicrobials:   IV cefepime 04/08/2017>>>> 04/11/2017     Subjective: Patient in bed with friends in the room.  Patient denies any chest pain or shortness of breath.  Tolerating tube feeds.  Patient asking when he is going to skilled nursing facility.  Patient getting frustrated.   Objective: Vitals:   04/24/17 2100 04/25/17 0640 04/25/17 0740 04/25/17 1147  BP: 118/67 131/63 (!) 145/86 (!) 113/58  Pulse: 84 83 (!) 106 75  Resp: 18 18 20 18   Temp: (!) 97.4 F (36.3 C) 98 F (36.7 C) (!) 97.5 F (36.4 C) 98 F (36.7 C)  TempSrc: Oral Oral Oral Oral  SpO2: 94% 96% 98% 97%  Weight:  73.1 kg (161 lb 1.6 oz)    Height:        Intake/Output Summary (Last 24 hours) at 04/25/2017 1446 Last data filed at 04/25/2017 0734 Gross per 24 hour  Intake 0 ml  Output 702 ml  Net -702 ml   Filed Weights   04/23/17 0152 04/24/17 0436 04/25/17 0640  Weight: 73.7 kg (162 lb 6.4 oz) 74.1 kg (163 lb 6.4 oz) 73.1 kg (161 lb 1.6 oz)    Examination:  General exam: NAD. Respiratory system: Lungs are clear to auscultation bilaterally.  No wheezes, no crackles, no rhonchi.  Normal respiratory effort.  Cardiovascular system: Irregularly irregular.  No murmurs rubs or gallops.  No JVD.  No lower extremity edema.  Gastrointestinal system: Abdomen is nontender, nondistended, soft, positive bowel sounds.  No rebound.  No guarding.     Central nervous system: Alert and oriented. No focal neurological deficits. Extremities: Symmetric 5 x 5 power. Skin: No rashes, lesions or ulcers Psychiatry: Judgement and insight appear fair. Mood & affect appropriate.     Data Reviewed: I have personally reviewed following labs and imaging studies  CBC: Recent Labs  Lab 04/20/17 0305 04/21/17 0538 04/23/17 0655  WBC 12.4* 8.5 7.1  HGB 12.2* 11.9* 11.6*  HCT 38.0* 37.2* 36.4*  MCV 93.6 93.9 94.5  PLT 209 235 950   Basic Metabolic Panel: Recent Labs  Lab 04/19/17 0642 04/20/17 0305 04/21/17 0538 04/22/17 0310 04/23/17 0655 04/24/17 0646 04/25/17 0559  NA 136 136 136  --  137 138  --   K 4.1 4.7 4.0  --  3.9 4.1  --   CL 101 101 102  --  103 100*  --   CO2 24 25 23   --  25 25  --   GLUCOSE 126* 110* 111*  --  165* 147*  --   BUN 11 18 16   --  15 15  --   CREATININE 0.84 0.95 0.86  --  0.85 0.81  --   CALCIUM 8.5* 8.4* 8.4*  --  8.6* 8.6*  --   MG 1.5* 2.7* 2.2  1.9 1.7 2.1 1.9   GFR: Estimated Creatinine Clearance: 72.7 mL/min (by C-G formula based on SCr of 0.81 mg/dL). Liver Function Tests: No results for input(s): AST, ALT, ALKPHOS, BILITOT, PROT, ALBUMIN in the last 168 hours. No results for input(s): LIPASE, AMYLASE in the last 168 hours. No results for input(s): AMMONIA in the last 168 hours. Coagulation Profile: Recent Labs  Lab 04/18/17 1658  INR 1.11   Cardiac Enzymes: No results for input(s): CKTOTAL, CKMB, CKMBINDEX, TROPONINI in the last 168 hours. BNP (last 3 results) No results for input(s): PROBNP in the last 8760 hours. HbA1C: No results for input(s): HGBA1C in the last 72 hours. CBG: Recent Labs  Lab 04/24/17 1634 04/24/17 2104 04/25/17 0129 04/25/17 0747 04/25/17 1155  GLUCAP 126* 132* 149* 150* 214*   Lipid Profile: No results for input(s): CHOL, HDL, LDLCALC, TRIG, CHOLHDL, LDLDIRECT in the last 72 hours. Thyroid Function Tests: No results for input(s): TSH, T4TOTAL, FREET4,  T3FREE, THYROIDAB in the last 72 hours. Anemia Panel: No results for input(s): VITAMINB12, FOLATE, FERRITIN, TIBC, IRON, RETICCTPCT in the last 72 hours. Sepsis Labs: No results for input(s): PROCALCITON, LATICACIDVEN in the last 168 hours.  No results found for this or any previous visit (from the past 240 hour(s)).       Radiology Studies: No results found.      Scheduled Meds: . digoxin  0.125 mg Oral Daily  . docusate  50 mg Per Tube BID  . feeding supplement (JEVITY 1.5 CAL/FIBER)  240 mL Per Tube 6 X Daily  . free water  120 mL Per Tube 6 X Daily  . guaiFENesin-dextromethorphan  15 mL Per Tube TID AC & HS  . metoprolol tartrate  25 mg Per Tube BID  .  morphine injection  2 mg Intravenous Once  . neomycin-bacitracin-polymyxin   Topical Daily  . OLANZapine zydis  5 mg Oral QHS  . sennosides  5 mL Per Tube BID   Continuous Infusions:    LOS: 18 days    Time spent: 35 mins    Irine Seal, MD [facility] Pager [phone]  If 7PM-7AM, please contact night-coverage [email or web address] Password [facility] 04/25/2017, 2:46 PM

## 2017-04-26 ENCOUNTER — Other Ambulatory Visit: Payer: Self-pay

## 2017-04-26 DIAGNOSIS — I2510 Atherosclerotic heart disease of native coronary artery without angina pectoris: Secondary | ICD-10-CM | POA: Diagnosis present

## 2017-04-26 DIAGNOSIS — E119 Type 2 diabetes mellitus without complications: Secondary | ICD-10-CM | POA: Diagnosis not present

## 2017-04-26 DIAGNOSIS — G629 Polyneuropathy, unspecified: Secondary | ICD-10-CM | POA: Diagnosis not present

## 2017-04-26 DIAGNOSIS — E041 Nontoxic single thyroid nodule: Secondary | ICD-10-CM | POA: Diagnosis not present

## 2017-04-26 DIAGNOSIS — R1312 Dysphagia, oropharyngeal phase: Secondary | ICD-10-CM | POA: Diagnosis not present

## 2017-04-26 DIAGNOSIS — D696 Thrombocytopenia, unspecified: Secondary | ICD-10-CM | POA: Diagnosis not present

## 2017-04-26 DIAGNOSIS — B370 Candidal stomatitis: Secondary | ICD-10-CM | POA: Diagnosis not present

## 2017-04-26 DIAGNOSIS — R531 Weakness: Secondary | ICD-10-CM | POA: Diagnosis not present

## 2017-04-26 DIAGNOSIS — Z931 Gastrostomy status: Secondary | ICD-10-CM | POA: Diagnosis not present

## 2017-04-26 DIAGNOSIS — J309 Allergic rhinitis, unspecified: Secondary | ICD-10-CM | POA: Diagnosis not present

## 2017-04-26 DIAGNOSIS — A048 Other specified bacterial intestinal infections: Secondary | ICD-10-CM | POA: Diagnosis not present

## 2017-04-26 DIAGNOSIS — E440 Moderate protein-calorie malnutrition: Secondary | ICD-10-CM | POA: Diagnosis present

## 2017-04-26 DIAGNOSIS — I25119 Atherosclerotic heart disease of native coronary artery with unspecified angina pectoris: Secondary | ICD-10-CM | POA: Diagnosis not present

## 2017-04-26 DIAGNOSIS — I10 Essential (primary) hypertension: Secondary | ICD-10-CM | POA: Diagnosis not present

## 2017-04-26 DIAGNOSIS — F4489 Other dissociative and conversion disorders: Secondary | ICD-10-CM | POA: Diagnosis not present

## 2017-04-26 DIAGNOSIS — Z95810 Presence of automatic (implantable) cardiac defibrillator: Secondary | ICD-10-CM | POA: Diagnosis not present

## 2017-04-26 DIAGNOSIS — R262 Difficulty in walking, not elsewhere classified: Secondary | ICD-10-CM | POA: Diagnosis not present

## 2017-04-26 DIAGNOSIS — S41112A Laceration without foreign body of left upper arm, initial encounter: Secondary | ICD-10-CM | POA: Diagnosis not present

## 2017-04-26 DIAGNOSIS — J90 Pleural effusion, not elsewhere classified: Secondary | ICD-10-CM | POA: Diagnosis not present

## 2017-04-26 DIAGNOSIS — I739 Peripheral vascular disease, unspecified: Secondary | ICD-10-CM | POA: Diagnosis not present

## 2017-04-26 DIAGNOSIS — I2782 Chronic pulmonary embolism: Secondary | ICD-10-CM | POA: Diagnosis not present

## 2017-04-26 DIAGNOSIS — I5022 Chronic systolic (congestive) heart failure: Secondary | ICD-10-CM | POA: Diagnosis present

## 2017-04-26 DIAGNOSIS — K219 Gastro-esophageal reflux disease without esophagitis: Secondary | ICD-10-CM | POA: Diagnosis present

## 2017-04-26 DIAGNOSIS — N179 Acute kidney failure, unspecified: Secondary | ICD-10-CM | POA: Diagnosis not present

## 2017-04-26 DIAGNOSIS — J449 Chronic obstructive pulmonary disease, unspecified: Secondary | ICD-10-CM | POA: Diagnosis not present

## 2017-04-26 DIAGNOSIS — J9601 Acute respiratory failure with hypoxia: Secondary | ICD-10-CM | POA: Diagnosis present

## 2017-04-26 DIAGNOSIS — K5909 Other constipation: Secondary | ICD-10-CM | POA: Diagnosis not present

## 2017-04-26 DIAGNOSIS — J181 Lobar pneumonia, unspecified organism: Secondary | ICD-10-CM | POA: Diagnosis not present

## 2017-04-26 DIAGNOSIS — E785 Hyperlipidemia, unspecified: Secondary | ICD-10-CM | POA: Diagnosis not present

## 2017-04-26 DIAGNOSIS — J101 Influenza due to other identified influenza virus with other respiratory manifestations: Secondary | ICD-10-CM | POA: Diagnosis present

## 2017-04-26 DIAGNOSIS — L603 Nail dystrophy: Secondary | ICD-10-CM | POA: Diagnosis not present

## 2017-04-26 DIAGNOSIS — I472 Ventricular tachycardia: Secondary | ICD-10-CM | POA: Diagnosis not present

## 2017-04-26 DIAGNOSIS — J189 Pneumonia, unspecified organism: Secondary | ICD-10-CM | POA: Diagnosis present

## 2017-04-26 DIAGNOSIS — R079 Chest pain, unspecified: Secondary | ICD-10-CM | POA: Diagnosis not present

## 2017-04-26 DIAGNOSIS — N183 Chronic kidney disease, stage 3 (moderate): Secondary | ICD-10-CM | POA: Diagnosis not present

## 2017-04-26 DIAGNOSIS — I499 Cardiac arrhythmia, unspecified: Secondary | ICD-10-CM | POA: Diagnosis not present

## 2017-04-26 DIAGNOSIS — Z8701 Personal history of pneumonia (recurrent): Secondary | ICD-10-CM | POA: Diagnosis not present

## 2017-04-26 DIAGNOSIS — R54 Age-related physical debility: Secondary | ICD-10-CM | POA: Diagnosis not present

## 2017-04-26 DIAGNOSIS — R410 Disorientation, unspecified: Secondary | ICD-10-CM | POA: Diagnosis not present

## 2017-04-26 DIAGNOSIS — G3184 Mild cognitive impairment, so stated: Secondary | ICD-10-CM | POA: Diagnosis not present

## 2017-04-26 DIAGNOSIS — M150 Primary generalized (osteo)arthritis: Secondary | ICD-10-CM | POA: Diagnosis not present

## 2017-04-26 DIAGNOSIS — I482 Chronic atrial fibrillation: Secondary | ICD-10-CM | POA: Diagnosis present

## 2017-04-26 DIAGNOSIS — Z79899 Other long term (current) drug therapy: Secondary | ICD-10-CM | POA: Diagnosis not present

## 2017-04-26 DIAGNOSIS — I110 Hypertensive heart disease with heart failure: Secondary | ICD-10-CM | POA: Diagnosis not present

## 2017-04-26 DIAGNOSIS — I4891 Unspecified atrial fibrillation: Secondary | ICD-10-CM | POA: Diagnosis not present

## 2017-04-26 DIAGNOSIS — J45909 Unspecified asthma, uncomplicated: Secondary | ICD-10-CM | POA: Diagnosis not present

## 2017-04-26 DIAGNOSIS — R1310 Dysphagia, unspecified: Secondary | ICD-10-CM | POA: Diagnosis not present

## 2017-04-26 DIAGNOSIS — R0602 Shortness of breath: Secondary | ICD-10-CM | POA: Diagnosis not present

## 2017-04-26 DIAGNOSIS — D509 Iron deficiency anemia, unspecified: Secondary | ICD-10-CM | POA: Diagnosis not present

## 2017-04-26 DIAGNOSIS — R627 Adult failure to thrive: Secondary | ICD-10-CM | POA: Diagnosis not present

## 2017-04-26 DIAGNOSIS — M6281 Muscle weakness (generalized): Secondary | ICD-10-CM | POA: Diagnosis present

## 2017-04-26 DIAGNOSIS — R1314 Dysphagia, pharyngoesophageal phase: Secondary | ICD-10-CM | POA: Diagnosis present

## 2017-04-26 DIAGNOSIS — J9611 Chronic respiratory failure with hypoxia: Secondary | ICD-10-CM | POA: Diagnosis not present

## 2017-04-26 DIAGNOSIS — R634 Abnormal weight loss: Secondary | ICD-10-CM | POA: Diagnosis not present

## 2017-04-26 DIAGNOSIS — D72821 Monocytosis (symptomatic): Secondary | ICD-10-CM | POA: Diagnosis not present

## 2017-04-26 LAB — GLUCOSE, CAPILLARY
GLUCOSE-CAPILLARY: 180 mg/dL — AB (ref 65–99)
GLUCOSE-CAPILLARY: 190 mg/dL — AB (ref 65–99)
Glucose-Capillary: 155 mg/dL — ABNORMAL HIGH (ref 65–99)

## 2017-04-26 LAB — MAGNESIUM: MAGNESIUM: 1.9 mg/dL (ref 1.7–2.4)

## 2017-04-26 MED ORDER — OLANZAPINE 5 MG PO TBDP: 5.0000 mg | ORAL_TABLET | Freq: Every day | ORAL | 0 refills | Status: DC

## 2017-04-26 MED ORDER — BACITRACIN-NEOMYCIN-POLYMYXIN OINTMENT TUBE: 1.0000 "application " | TOPICAL_OINTMENT | Freq: Every day | CUTANEOUS | 0 refills | Status: AC

## 2017-04-26 MED ORDER — JEVITY 1.5 CAL/FIBER PO LIQD: 240.0000 mL | Freq: Every day | ORAL | 0 refills | Status: DC

## 2017-04-26 MED ORDER — POTASSIUM CHLORIDE 20 MEQ/15ML (10%) PO SOLN: 10.0000 meq | Freq: Every day | ORAL | 0 refills | Status: DC

## 2017-04-26 MED ORDER — ACETAMINOPHEN 650 MG RE SUPP: 650.0000 mg | Freq: Four times a day (QID) | RECTAL | 0 refills | Status: DC | PRN

## 2017-04-26 MED ORDER — MENTHOL 3 MG MT LOZG: 1.0000 | LOZENGE | OROMUCOSAL | 12 refills | Status: DC | PRN

## 2017-04-26 MED ORDER — SENNOSIDES 8.8 MG/5ML PO SYRP: 5.0000 mL | ORAL_SOLUTION | Freq: Two times a day (BID) | ORAL | 0 refills | Status: DC

## 2017-04-26 MED ORDER — LISINOPRIL 1 MG/ML PO SOLN: 2.5000 mg | Freq: Every day | ORAL | 0 refills | Status: DC

## 2017-04-26 MED ORDER — PANTOPRAZOLE SODIUM 40 MG PO PACK: 40.0000 mg | PACK | Freq: Every day | ORAL | 0 refills | Status: DC

## 2017-04-26 MED ORDER — LORATADINE 10 MG PO TABS: 10.0000 mg | ORAL_TABLET | Freq: Every day | ORAL | Status: DC | PRN

## 2017-04-26 MED ORDER — TRAMADOL HCL 50 MG PO TABS: ORAL_TABLET | ORAL | 0 refills | Status: DC

## 2017-04-26 MED ORDER — FREE WATER: 120.0000 mL | Freq: Every day | Status: DC

## 2017-04-26 MED ORDER — TRAMADOL 5 MG/ML ORAL SUSPENSION: 50.0000 mg | Freq: Four times a day (QID) | ORAL | 0 refills | Status: DC | PRN

## 2017-04-26 MED ORDER — NITROGLYCERIN 0.4 MG SL SUBL: 0.4000 mg | SUBLINGUAL_TABLET | SUBLINGUAL | 12 refills | Status: AC | PRN

## 2017-04-26 MED ORDER — METOPROLOL TARTRATE 25 MG/10 ML ORAL SUSPENSION: 25.0000 mg | Freq: Two times a day (BID) | ORAL | 0 refills | Status: DC

## 2017-04-26 MED ORDER — IPRATROPIUM-ALBUTEROL 0.5-2.5 (3) MG/3ML IN SOLN: 3.0000 mL | RESPIRATORY_TRACT | 0 refills | Status: DC | PRN

## 2017-04-26 MED ORDER — FUROSEMIDE 8 MG/ML PO SOLN: 20.0000 mg | Freq: Every day | ORAL | 0 refills | Status: DC

## 2017-04-26 MED ORDER — FLUTICASONE PROPIONATE 50 MCG/ACT NA SUSP: 1.0000 | Freq: Every day | NASAL | 2 refills | Status: DC | PRN

## 2017-04-26 MED ORDER — DIGOXIN 0.05 MG/ML PO SOLN: 0.1250 mg | Freq: Every day | ORAL | 0 refills | Status: DC

## 2017-04-26 NOTE — Clinical Social Work Note (Addendum)
CSW spoke with clinical liaison for [facility]. Patient's son spoke with admissions coordinator late yesterday afternoon and will complete the paperwork today. They will notify CSW once paperwork has been received.  Dayton Scrape, [place] [phone]  11:40 am Admissions coordinator at [facility] recently spoke with patient's son. He will send completed paperwork once he arrives at work.  Dayton Scrape, [place]  2:10 pm Paperwork has not been received yet.  Dayton Scrape, [place] [phone]  2:41 pm Paperwork has been completed and received by the admissions coordinator.  Dayton Scrape, [place]

## 2017-04-26 NOTE — Discharge Summary (Signed)
Physician Discharge Summary  [name] QPY:[id number] DOB: 06-02-1932 DOA: 04/07/2017  PCP: [name], MD  Admit date: 04/07/2017 Discharge date: 04/26/2017  Time spent: 55 minutes  Recommendations for Outpatient Follow-up:  1. He will be discharged to skilled nursing facility.  Follow-up with MD at skilled nursing facility. 2. Palliative care to follow patient at skilled nursing facility. 3. Speech therapy to follow-up with patient at skilled nursing facility.   Discharge Diagnoses:  Principal Problem:   Acute respiratory failure with hypoxia ([place]) Active Problems:   HCAP (healthcare-associated pneumonia)   Influenza A   Chronic atrial fibrillation (HCC)   Chronic systolic heart failure- EF 35-40%   Automatic implantable cardioverter-defibrillator in situ   CAD (coronary artery disease)   Thrombocytopenia (HCC)   Pharyngoesophageal dysphagia   Gastroesophageal reflux disease without esophagitis   ICD (implantable cardioverter-defibrillator) in place   Evaluation by psychiatric service required   Sepsis ([place])   CKD (chronic kidney disease), stage III ([place])   Malnutrition of moderate degree   Goals of care, counseling/discussion   Palliative care by specialist   DNR (do not resuscitate)   Dysphagia   Adult failure to thrive   Discharge Condition: Stable and improved  Diet recommendation: Patient getting PEG tube feeds.  Speech therapy will need to follow-up at the skilled nursing facility to further assess his dysphagia.  Filed Weights   04/24/17 0436 04/25/17 0640 04/26/17 0404  Weight: 74.1 kg (163 lb 6.4 oz) 73.1 kg (161 lb 1.6 oz) 72.3 kg (159 lb 6.3 oz)    History of present illness:  Per Dr. Letta Median is a 84 y.o. male with medical history significant of afibnot on anticoagulation due to prior subdural hemorrhage, PE, hypertension, hyperlipidemia, diet-controlled diabetes, asthma, GERD, SDH, CAD, s/p of CABG and stent placement, sCHF with EF  35%, AICD placement, who presented with cough, chest pain or fever.  Patient stated that he started having cough and chest pain the day prior to admission. The chest pain was located in the central chest, mild, sharp, nonradiating, currently chest has resolved. He coughs up a little mucus. He denied fever, but his temperature was 102.1 in ED. No chills. No runny nose or sore throat. Patient denied nausea, vomiting, diarrhea, abdominal pain, symptoms of UTI or unilateral weakness.  ED Course: pt was found to have positive Flu A PCR, WBC 9.5, lactic acid 1.81, negative troponin, pending urinalysis, stable renal function, tachycardia, tachypnea, oxygen saturation section 83% on room air, temperature 102.1, chest x-ray showed possible patch infiltration in right base. Patient was admitted to telemetry bed as inpatient    Hospital Course:  #1 acute hypoxic respiratory failure secondary to healthcare associated pneumonia/influenza A/acute on chronic aspiration Patient  was admitted with acute hypoxic respiratory failure felt to be secondary to healthcare associated pneumonia, influenza A, acute chronic aspiration.  Patient received a 5-day course of Tamiflu.  Patient was placed empirically on IV cefepime and improved clinically and treat for total of 5 days.  Patient sats improvement was satting 95-97% on room air.  Patient will be discharged in stable and improved condition.    2.  Acute on chronic severe dysphagia Patient was assessed by speech therapist and underwent a modified barium swallow 04/10/2017 that showed moderate aspiration risk.  N.p.o. was recommended by speech therapy.  Coretrack was placed 04/13/2017 for medications, nutrition and hydration however core track was dislodged.  Patient was seen by palliative care was recommended for PEG tube placement.  PEG tube placed 04/19/2017 per interventional radiology.  Dietitian consulted and patient started on tube feeds which are being adjusted.   Outpatient follow-up with speech therapy for further evaluation of dysphagia and possible advancement of diet.    3.  Acute metabolic encephalopathy Likely secondary to problem #1 with probable underlying dementia.  Patient was treated for influenza A, pneumonia and improved clinically during the hospitalization.  Patient noted to be agitated the night of 04/22/2017.  Patient was on Seroquel however not getting it scheduled through PEG tube.  Seroquel was subsequently discontinued and patient started on Zyprexa disintegrating tablet nightly.  Patient improved clinically and was at baseline by day of discharge.    4.  Chronic A. fib Patient was on metoprolol and digoxin for rate control.  Patient not a anticoagulation candidate secondary to history of GI bleed.   5.  Polyneuropathy Remained stable.  Patient was maintained on home regimen of gabapentin.    6.  Chronic systolic heart failure/EF 35%/status post AICD Main compensated throughout the hospitalization.  Patient was maintained on his cardiac medications of metoprolol and digoxin.  Patient's Lasix and lisinopril will be resumed on discharge.  Outpatient follow-up.   7.  Atypical chest pain Patient with complaints of left-sided chest pain the night of 04/17/2017.  Cardiac enzymes were negative x3.  EKG with no ischemic changes noted.  No further chest pain.  No further ischemic workup needed. Outpatient follow-up.  10.  Goals of care Patient seen by palliative care who discussed with patient and son over the phone who are in agreement with PEG tube.  Patient status post PEG tube placement per interventional radiology 04/19/2017.  Patient initially told palliative care will be interested in inpatient rehab and then patient subsequently changed his mind and wants to go to SNF so his friends are closer and can visit him.  Patient will likely need palliative care vs hospice to follow in the outpatient setting.      Procedures:  PEG tube  placement 04/19/2017 per Dr. Vernard Gambles  Chest x-ray 04/07/2017, 04/09/2017  Modified barium swallow 04/10/2017      Consultations:  Psychiatry: Dr. Mariea Clonts 04/18/2017  Palliative care [name], NP 04/14/2017  Inpatient rehab: Dr. Tessa Lerner 04/21/2017      Discharge Exam: Vitals:   04/25/17 2132 04/26/17 0404  BP: 128/68 118/72  Pulse: 97 92  Resp: 16 18  Temp: 97.7 F (36.5 C) 98 F (36.7 C)  SpO2: 99% 98%    General: NAD Cardiovascular: RRR Respiratory: CTAB  Discharge Instructions   Discharge Instructions    Increase activity slowly   Complete by:  As directed      Allergies as of 04/26/2017      Reactions   Tamsulosin Other (See Comments)   Dizziness, Made BP very low and weakness   Celebrex [celecoxib] Hives, Nausea And Vomiting   Gi upset   Dronedarone Nausea And Vomiting, Other (See Comments)   GI upset, abdominal pain   Esomeprazole Magnesium Hives, Other (See Comments)   "don't really remember"   Digoxin Diarrhea   May have caused some diarrhea   Hydrocodone-acetaminophen Other (See Comments)   Bad headache   Protonix [pantoprazole Sodium] Nausea And Vomiting, Other (See Comments)   Tolerates Dexilant      Medication List    STOP taking these medications   acetaminophen 325 MG tablet Commonly known as:  TYLENOL Replaced by:  acetaminophen 650 MG suppository   calcium carbonate 500 MG chewable tablet Commonly known  as:  TUMS - dosed in mg elemental calcium   digoxin 0.125 MG tablet Commonly known as:  LANOXIN Replaced by:  digoxin 0.05 MG/ML solution   furosemide 40 MG tablet Commonly known as:  LASIX Replaced by:  furosemide 8 MG/ML solution   lisinopril 10 MG tablet Commonly known as:  PRINIVIL,ZESTRIL Replaced by:  Lisinopril 1 MG/ML Soln   metoprolol succinate 25 MG 24 hr tablet Commonly known as:  TOPROL-XL   potassium chloride 10 MEQ tablet Commonly known as:  K-DUR,KLOR-CON   traMADol 50 MG tablet Commonly known as:   ULTRAM     TAKE these medications   acetaminophen 650 MG suppository Commonly known as:  TYLENOL Place 1 suppository (650 mg total) rectally every 6 (six) hours as needed for fever or mild pain (T over 101). Replaces:  acetaminophen 325 MG tablet   ADVAIR DISKUS 250-50 MCG/DOSE Aepb Generic drug:  Fluticasone-Salmeterol Inhale 1 puff into the lungs 2 (two) times daily.   digoxin 0.05 MG/ML solution Commonly known as:  LANOXIN Place 2.5 mLs (0.125 mg total) into feeding tube daily. Replaces:  digoxin 0.125 MG tablet   feeding supplement (JEVITY 1.5 CAL/FIBER) Liqd Place 240 mLs into feeding tube 6 (six) times daily.   fluticasone 50 MCG/ACT nasal spray Commonly known as:  FLONASE Place 1 spray into both nostrils daily as needed for allergies.   free water Soln Place 120 mLs into feeding tube 6 (six) times daily.   furosemide 8 MG/ML solution Commonly known as:  LASIX Place 2.5 mLs (20 mg total) into feeding tube daily. Replaces:  furosemide 40 MG tablet   gabapentin 100 MG capsule Commonly known as:  NEURONTIN TAKE 1 CAPSULE (100MG ) TO 3 CAPSULES (300MG ) EACH NIGHT AS NEEDED FOR MUSCLE PAINS What changed:    how much to take  how to take this  when to take this  reasons to take this  additional instructions   ipratropium-albuterol 0.5-2.5 (3) MG/3ML Soln Commonly known as:  DUONEB Take 3 mLs by nebulization every 4 (four) hours as needed.   Lisinopril 1 MG/ML Soln Place 2.5 mg into feeding tube daily. Replaces:  lisinopril 10 MG tablet   loratadine 10 MG tablet Commonly known as:  CLARITIN Take 1 tablet (10 mg total) by mouth daily as needed for allergies.   menthol-cetylpyridinium 3 MG lozenge Commonly known as:  CEPACOL Take 1 lozenge (3 mg total) by mouth as needed for sore throat.   metoprolol tartrate 25 mg/10 mL Susp Commonly known as:  LOPRESSOR Place 10 mLs (25 mg total) into feeding tube 2 (two) times daily.   neomycin-bacitracin-polymyxin  Oint Commonly known as:  NEOSPORIN Apply 1 application topically daily for 4 days. Start taking on:  04/27/2017   nitroGLYCERIN 0.4 MG SL tablet Commonly known as:  NITROSTAT Place 1 tablet (0.4 mg total) under the tongue every 5 (five) minutes as needed for chest pain. What changed:  See the new instructions.   OLANZapine zydis 5 MG disintegrating tablet Commonly known as:  ZYPREXA Take 1 tablet (5 mg total) by mouth at bedtime.   pantoprazole sodium 40 mg/20 mL Pack Commonly known as:  PROTONIX Place 20 mLs (40 mg total) into feeding tube daily.   phenol 1.4 % Liqd Commonly known as:  CHLORASEPTIC Use as directed 1 spray in the mouth or throat as needed for throat irritation / pain.   potassium chloride 20 MEQ/15ML (10%) Soln Place 7.5 mLs (10 mEq total) into feeding tube daily.  RESTASIS 0.05 % ophthalmic emulsion Generic drug:  cycloSPORINE INSTILL 1 DROP INTO BOTH EYES TWICE A DAY   sennosides 8.8 MG/5ML syrup Commonly known as:  SENOKOT Place 5 mLs into feeding tube 2 (two) times daily.   traMADol 5 mg/mL Susp Commonly known as:  ULTRAM Place 10-20 mLs (50-100 mg total) into feeding tube every 6 (six) hours as needed for moderate pain.      Allergies  Allergen Reactions  . Tamsulosin Other (See Comments)    Dizziness, Made BP very low and weakness  . Celebrex [Celecoxib] Hives and Nausea And Vomiting    Gi upset  . Dronedarone Nausea And Vomiting and Other (See Comments)    GI upset, abdominal pain  . Esomeprazole Magnesium Hives and Other (See Comments)    "don't really remember"  . Digoxin Diarrhea    May have caused some diarrhea  . Hydrocodone-Acetaminophen Other (See Comments)    Bad headache  . Protonix [Pantoprazole Sodium] Nausea And Vomiting and Other (See Comments)    Tolerates Dexilant    Contact information for follow-up providers    MD AT SNF.        Palliative care Follow up.   Why:  Palliative care to follow at skilled nursing  facility       Speech therapy Follow up.   Why:  Speech therapy to follow-up at facility to evaluate patient's dysphagia           Contact information for after-discharge care    [facility] .   Service:  Skilled Nursing Contact information: [address] [place] [phone]                   The results of significant diagnostics from this hospitalization (including imaging, microbiology, ancillary and laboratory) are listed below for reference.    Significant Diagnostic Studies: Dg Chest 2 View  Result Date: 04/07/2017 CLINICAL DATA:  Fever and cough EXAM: CHEST - 2 VIEW COMPARISON:  04/01/2017 CT chest 03/26/2017, radiographs 03/25/2017, 03/03/2017, 12/23/2016 FINDINGS: Post sternotomy changes. Left-sided pacing device as before. Borderline to mild cardiomegaly. Aortic atherosclerosis. Patchy infiltrate at the right base. No pneumothorax. Degenerative changes of the spine. IMPRESSION: Suspected patchy infiltrate at the right base, short interval radiographic follow-up suggested to ensure clearing. Cardiomegaly. Electronically Signed   By: Donavan Foil M.D.   On: 04/07/2017 03:33   Dg Chest 2 View  Result Date: 04/01/2017 CLINICAL DATA:  Pain after fall EXAM: CHEST  2 VIEW COMPARISON:  March 25, 2017 FINDINGS: Postsurgical changes in the chest with sternotomy wires. Mild cardiomegaly. The hila and mediastinum are normal. No pneumothorax. No nodules or masses. No focal infiltrates. IMPRESSION: No active cardiopulmonary disease. Electronically Signed   By: Dorise Bullion III M.D   On: 04/01/2017 12:42   Dg Pelvis 1-2 Views  Result Date: 04/01/2017 CLINICAL DATA:  Fall.  Pain. EXAM: PELVIS - 1-2 VIEW COMPARISON:  None. FINDINGS: Degenerative changes in the hips, right greater than left. No acute fractures are seen. IMPRESSION: Degenerative changes in the hips, right greater than left. Electronically  Signed   By: Dorise Bullion III M.D   On: 04/01/2017 12:43   Ct Head Wo Contrast  Result Date: 04/01/2017 CLINICAL DATA:  Found down by a friend who last saw the patient yesterday. EXAM: CT HEAD WITHOUT CONTRAST CT CERVICAL SPINE WITHOUT CONTRAST TECHNIQUE: Multidetector CT imaging of the head and cervical spine was  performed following the standard protocol without intravenous contrast. Multiplanar CT image reconstructions of the cervical spine were also generated. COMPARISON:  Head CT - 03/26/2017; 03/24/2015; 09/10/2012 FINDINGS: CT HEAD FINDINGS Brain: Similar findings atrophy with sulcal prominence. Scattered periventricular hypodensities compatible microvascular ischemic disease, unchanged. Given background parenchymal abnormalities, there is no CT evidence of superimposed acute large territory infarct. No intraparenchymal or extra-axial mass or hemorrhage. Unchanged size and configuration of the ventricles and the basilar cisterns. No midline shift. Vascular: Intracranial atherosclerosis. Skull: No displaced calvarial fracture. Sinuses/Orbits: Limited visualization of the paranasal sinuses and mastoid air cells is normal. No air-fluid levels. Other: Regional soft tissues appear normal. CT CERVICAL SPINE FINDINGS Alignment: C1 to the superior endplate of T2 is imaged. There is straightening expected cervical lordosis. No anterolisthesis or retrolisthesis. The dens is normally positioned between the lateral masses of C1. Mild-to-moderate degenerative change of the atlantodental articulation. Normal atlantoaxial articulations. Skull base and vertebrae: Cervical vertebral body heights are preserved. Prevertebral soft tissues are normal. Soft tissues and spinal canal: Regional soft tissues appear normal. No bulky cervical lymphadenopathy on this noncontrast examination. There is partial ossification the nuchal ligament. Disc levels: There is multi level flowing osteophytosis and ankylosis throughout the  cervical spine. Prominent posteriorly directed disc osteophyte complexes are seen most conspicuously at C3-C4 (resulting in at least moderate spinal stenosis at this location-axial image 39, series 8; sagittal image 28, series 4), and to a lesser extent, C5-C6 and C4-C5. Upper chest: Limited visualization the lung apices demonstrates minimal biapical pleuroparenchymal thickening. Other: Note is made of a large (approximately 3.8 x 2.8 cm nodule/mass nearly replacing the left lobe of the thyroid which is noted to contain several internal calcifications. Heterogeneous appearance of the imaged portions of the right lobe of the thyroid. IMPRESSION: 1. Similar findings of atrophy and microvascular ischemic disease without superimposed acute intracranial process. 2. No fracture or static subluxation of the cervical spine. 3. Multi level flowing osteophytosis and ankylosis throughout the cervical spine compatible with DISH. 4. Prominent posteriorly directed disc osteophyte complexes are seen throughout the cervical spine, worse at C3-C4, resulting at least moderate spinal stenosis at this location. Further evaluation with dedicated nonemergent cervical spine MRI performed as indicated. 5. Incidentally noted approximately 3.8 cm nodule/mass nearly replacing the left lobe of the thyroid. Further evaluation with dedicated nonemergent thyroid ultrasound could be performed as clinically indicated. Electronically Signed   By: Sandi Mariscal M.D.   On: 04/01/2017 13:21   Ct Cervical Spine Wo Contrast  Result Date: 04/01/2017 CLINICAL DATA:  Found down by a friend who last saw the patient yesterday. EXAM: CT HEAD WITHOUT CONTRAST CT CERVICAL SPINE WITHOUT CONTRAST TECHNIQUE: Multidetector CT imaging of the head and cervical spine was performed following the standard protocol without intravenous contrast. Multiplanar CT image reconstructions of the cervical spine were also generated. COMPARISON:  Head CT - 03/26/2017; 03/24/2015;  09/10/2012 FINDINGS: CT HEAD FINDINGS Brain: Similar findings atrophy with sulcal prominence. Scattered periventricular hypodensities compatible microvascular ischemic disease, unchanged. Given background parenchymal abnormalities, there is no CT evidence of superimposed acute large territory infarct. No intraparenchymal or extra-axial mass or hemorrhage. Unchanged size and configuration of the ventricles and the basilar cisterns. No midline shift. Vascular: Intracranial atherosclerosis. Skull: No displaced calvarial fracture. Sinuses/Orbits: Limited visualization of the paranasal sinuses and mastoid air cells is normal. No air-fluid levels. Other: Regional soft tissues appear normal. CT CERVICAL SPINE FINDINGS Alignment: C1 to the superior endplate of T2 is imaged. There is straightening expected cervical  lordosis. No anterolisthesis or retrolisthesis. The dens is normally positioned between the lateral masses of C1. Mild-to-moderate degenerative change of the atlantodental articulation. Normal atlantoaxial articulations. Skull base and vertebrae: Cervical vertebral body heights are preserved. Prevertebral soft tissues are normal. Soft tissues and spinal canal: Regional soft tissues appear normal. No bulky cervical lymphadenopathy on this noncontrast examination. There is partial ossification the nuchal ligament. Disc levels: There is multi level flowing osteophytosis and ankylosis throughout the cervical spine. Prominent posteriorly directed disc osteophyte complexes are seen most conspicuously at C3-C4 (resulting in at least moderate spinal stenosis at this location-axial image 39, series 8; sagittal image 28, series 4), and to a lesser extent, C5-C6 and C4-C5. Upper chest: Limited visualization the lung apices demonstrates minimal biapical pleuroparenchymal thickening. Other: Note is made of a large (approximately 3.8 x 2.8 cm nodule/mass nearly replacing the left lobe of the thyroid which is noted to contain  several internal calcifications. Heterogeneous appearance of the imaged portions of the right lobe of the thyroid. IMPRESSION: 1. Similar findings of atrophy and microvascular ischemic disease without superimposed acute intracranial process. 2. No fracture or static subluxation of the cervical spine. 3. Multi level flowing osteophytosis and ankylosis throughout the cervical spine compatible with DISH. 4. Prominent posteriorly directed disc osteophyte complexes are seen throughout the cervical spine, worse at C3-C4, resulting at least moderate spinal stenosis at this location. Further evaluation with dedicated nonemergent cervical spine MRI performed as indicated. 5. Incidentally noted approximately 3.8 cm nodule/mass nearly replacing the left lobe of the thyroid. Further evaluation with dedicated nonemergent thyroid ultrasound could be performed as clinically indicated. Electronically Signed   By: Sandi Mariscal M.D.   On: 04/01/2017 13:21   Ir Gastrostomy Tube Mod Sed  Result Date: 04/19/2017 CLINICAL DATA:  Dysphagia, needs enteral feeding support EXAM: PERC PLACEMENT GASTROSTOMY FLUOROSCOPY TIME:  3 minutes 24 seconds; 13 mGy TECHNIQUE: The procedure, risks, benefits, and alternatives were explained to the patient. Questions regarding the procedure were encouraged and answered. The patient understands and consents to the procedure. As antibiotic prophylaxis, cefazolin 2 g was ordered pre-procedure and administered intravenously within one hour of incision. A safe percutaneous approach was confirmed on recent CT abdomen. A 5 French angiographic catheter was placed as orogastric tube. The upper abdomen was prepped with Betadine, draped in usual sterile fashion, and infiltrated locally with 1% lidocaine. Intravenous Fentanyl and Versed were administered as conscious sedation during continuous monitoring of the patient's level of consciousness and physiological / cardiorespiratory status by the radiology RN, with a  total moderate sedation time of 10 minutes. Stomach was insufflated using air through the orogastric tube. An 3 French needle was advanced percutaneously into the gastric lumen under fluoroscopy. Gas could be aspirated and a small contrast injection confirmed intraluminal spread. The needle was exchanged over a guidewire for a 9 [place] vascular sheath, through which the snare device was advanced and used to snare a guidewire passed through the orogastric tube. This was withdrawn, and the snare attached to the 20 French pull-through gastrostomy tube, which was advanced antegrade, positioned with the internal bumper securing the anterior gastric wall to the anterior abdominal wall. Small contrast injection confirms appropriate positioning. The external bumper was applied and the catheter was flushed. COMPLICATIONS: COMPLICATIONS none IMPRESSION: 1. Technically successful 20 French pull-through gastrostomy placement under fluoroscopy. Electronically Signed   By: Lucrezia Europe M.D.   On: 04/19/2017 11:12   Dg Chest Port 1 View  Result Date: 04/09/2017 CLINICAL DATA:  Hypoxia  EXAM: PORTABLE CHEST 1 VIEW COMPARISON:  04/07/2017 FINDINGS: Left AICD remains in place, unchanged. Epicardial pacer wires noted over the left heart, stable. Cardiomegaly. There is hyperinflation of the lungs compatible with COPD. Increasing right basilar airspace opacity and small right effusion concerning for pneumonia. IMPRESSION: COPD, cardiomegaly, stable. Increasing right basilar airspace opacity and small right effusion concerning for pneumonia. Electronically Signed   By: Rolm Baptise M.D.   On: 04/09/2017 07:44   Dg Swallowing Func-speech Pathology  Result Date: 04/10/2017 Objective Swallowing Evaluation: Type of Study: MBS-Modified Barium Swallow Study  Patient Details Name: [name] MRN: [id number] Date of Birth: [birth year] Today's Date: 04/10/2017 Time: SLP Start Time (ACUTE ONLY): 1033 -SLP Stop Time (ACUTE ONLY): 1048 SLP  Time Calculation (min) (ACUTE ONLY): 15 min Past Medical History: Past Medical History: Diagnosis Date . Abnormal thyroid scan   Abnormal thyroid imaging studies from 11/09/2010, status post ultrasound guided fine needle aspiration of the dominant left inferior thyroid nodule on 12/15/2010. Cytology report showed rare follicular epithelial cells and hemosiderin laden macrophages. Marland Kitchen AICD (automatic cardioverter/defibrillator) present   a. fx lead; a. s/p lead extraction 03/02/17 . Arthritis   "all over" . Asthma  . Atrial fibrillation ([place])   on chronic Coumadin; stopped July 2013 due to subdural hematomas . CHF (congestive heart failure) (HCC)   EF 35-40% s/p most recent ICD generator change-out with [employer] dual-chamber ICD 05/20/11 with explantation of previous abdominally-implanted device . Coronary artery disease   s/p CABG 1983 and PCI/stent 2004.  . Diabetes mellitus   diet controlled . Dyslipidemia  . Erythrocytosis  . GERD (gastroesophageal reflux disease)  . Hypertension  . Ischemic cardiomyopathy   WITH CHF . Monocytosis 04/17/2013 . Myocardial infarction ([place]) 1983; ~ 1990 . Pneumonia August 2013 . Subdural hematoma [facility]) July 2013  Anticoagulation stopped.  . SunDown syndrome  . VT (ventricular tachycardia) ([place])  Past Surgical History: Past Surgical History: Procedure Laterality Date . CHOLECYSTECTOMY   . COLONOSCOPY  07/07/2011  Procedure: COLONOSCOPY;  Surgeon: Jerene Bears, MD;  [facility];  Service: Gastroenterology;  Laterality: N/A; . CORONARY ANGIOPLASTY WITH STENT PLACEMENT  2004  Tandem Cypher stents LAD . CORONARY ARTERY BYPASS GRAFT  1983  SVG-mLAD . ESOPHAGOGASTRODUODENOSCOPY  02/11/2011  Procedure: ESOPHAGOGASTRODUODENOSCOPY (EGD);  Surgeon: Beryle Beams, MD;  Location: [facility];  Service: Endoscopy;  Laterality: N/A; . FLEXIBLE SIGMOIDOSCOPY N/A 02/08/2017  Procedure: FLEXIBLE SIGMOIDOSCOPY;  Surgeon: Irene Shipper, MD;  [facility];  Service: Endoscopy;   Laterality: N/A; . ICD GENERATOR CHANGEOUT N/A 04/08/2016  Procedure: ICD Generator Changeout;  Surgeon: Evans Lance, MD;  Location: [place] [facility];  Service: Cardiovascular;  Laterality: N/A; . ICD LEAD REMOVAL N/A 03/02/2017  Procedure: ICD LEAD REMOVAL;  Surgeon: Evans Lance, MD;  Location: [place];  Service: Cardiovascular;  Laterality: N/A; . IMPLANTABLE CARDIOVERTER DEFIBRILLATOR (ICD) GENERATOR CHANGE N/A 05/20/2011  Procedure: ICD GENERATOR CHANGE;  Surgeon: Evans Lance, MD;  [employer] secure dual-chamber ICD serial number [id number]  . KNEE ARTHROSCOPY    right; "just went in and scraped it" . LEAD INSERTION N/A 03/02/2017  Procedure: LEAD INSERTION;  Surgeon: Evans Lance, MD;  Location: [place];  Service: Cardiovascular;  Laterality: N/A; . MASS EXCISION Right 05/10/2013  Procedure: EXCISION MASS RIGHT THUMB;  Surgeon: Wynonia Sours, MD;  Location: [place];  Service: Orthopedics;  Laterality: Right; . PROXIMAL INTERPHALANGEAL FUSION (PIP) Right 05/10/2013  Procedure: DEBRIDEMENT PROXIMAL INTERPHALANGEAL FUSION (PIP);  Surgeon:  Wynonia Sours, MD;  Location: [place];  Service: Orthopedics;  Laterality: Right; . TONSILLECTOMY      HPI: Quantavis Obryant Ludwickis a 84 y.o.malewith medical history significant ofafibnot on anticoagulation due to prior subdural hemorrhage,PE,hypertension, hyperlipidemia, diet-controlled diabetes, asthma, GERD, SDH, CAD,s/p ofCABGandstent placement,sCHFwith EF 35%, AICD placement, who presents with cough, chest pain and fever. T-max 102.1. Right lower lobe infiltrates. Admitted for HCAP complicated by influenza A infection. Pt has a history of mild oropharyngeal and cervical esophageal dysphagia per MBS 02/07/14; pharyngeal residuals noted across consistencies that liquid swallow help to clear but with resultant trace aspiration of liquids (thin and nectar). Regular diet with thin liquids was recommended as pt's weight was stable and  no PNA since 2013. MBS recommended to fully evaluate oropharyngeal swallow.  Subjective: pt in bed, coughing Assessment / Plan / Recommendation CHL IP CLINICAL IMPRESSIONS 04/10/2017 Clinical Impression Pt exhibits moderate pharyngeal dysphagia resulting in laryngeal penetration and aspiration with all consistencies. Impairments encompass weakness and decreased timing and initiation of protective mechanisms. Decreased pharyngeal contraction, laryngeal elevation and incomplete epiglottic deflection led to penetration to vocal cords and eventual aspiration (silent) with ineffective chin tuck posture attempted. Maximum pyriform sinus and min-mild vallecuar retention post swallow not adequately reduced with cued second swallows. Penetrated during second swallow attempts intermittently. Lingual pumping and lingual residue present. Aspiration risk too high to recommend any safe consistency at present. He has history of dysphagia now exacerbated with current illness and multiple recent hospitalizations. Recommend NPO with short term alternative nutrition and continued ST.  SLP Visit Diagnosis Dysphagia, oropharyngeal phase (R13.12) Attention and concentration deficit following -- Frontal lobe and executive function deficit following -- Impact on safety and function Moderate aspiration risk   CHL IP TREATMENT RECOMMENDATION 04/10/2017 Treatment Recommendations Therapy as outlined in treatment plan below   Prognosis 04/10/2017 Prognosis for Safe Diet Advancement (No Data) Barriers to Reach Goals -- Barriers/Prognosis Comment -- CHL IP DIET RECOMMENDATION 04/10/2017 SLP Diet Recommendations NPO Liquid Administration via -- Medication Administration Via alternative means Compensations -- Postural Changes --   CHL IP OTHER RECOMMENDATIONS 04/10/2017 Recommended Consults -- Oral Care Recommendations Oral care QID Other Recommendations --   CHL IP FOLLOW UP RECOMMENDATIONS 04/10/2017 Follow up Recommendations Other (comment)   CHL IP  FREQUENCY AND DURATION 04/10/2017 Speech Therapy Frequency (ACUTE ONLY) min 2x/week Treatment Duration 2 weeks      CHL IP ORAL PHASE 04/10/2017 Oral Phase Impaired Oral - Pudding Teaspoon -- Oral - Pudding Cup -- Oral - Honey Teaspoon -- Oral - Honey Cup Lingual pumping;Lingual/palatal residue Oral - Nectar Teaspoon -- Oral - Nectar Cup -- Oral - Nectar Straw -- Oral - Thin Teaspoon -- Oral - Thin Cup Decreased bolus cohesion Oral - Thin Straw -- Oral - Puree Lingual pumping Oral - Mech Soft -- Oral - Regular -- Oral - Multi-Consistency -- Oral - Pill -- Oral Phase - Comment --  CHL IP PHARYNGEAL PHASE 04/10/2017 Pharyngeal Phase Impaired Pharyngeal- Pudding Teaspoon -- Pharyngeal -- Pharyngeal- Pudding Cup -- Pharyngeal -- Pharyngeal- Honey Teaspoon -- Pharyngeal -- Pharyngeal- Honey Cup Penetration/Aspiration during swallow;Pharyngeal residue - valleculae;Pharyngeal residue - pyriform;Reduced epiglottic inversion;Reduced pharyngeal peristalsis;Reduced laryngeal elevation;Reduced airway/laryngeal closure Pharyngeal Material enters airway, remains ABOVE vocal cords and not ejected out Pharyngeal- Nectar Teaspoon -- Pharyngeal -- Pharyngeal- Nectar Cup -- Pharyngeal -- Pharyngeal- Nectar Straw -- Pharyngeal -- Pharyngeal- Thin Teaspoon -- Pharyngeal -- Pharyngeal- Thin Cup Reduced pharyngeal peristalsis;Reduced epiglottic inversion;Reduced anterior laryngeal mobility;Reduced laryngeal elevation;Reduced airway/laryngeal closure;Penetration/Aspiration during  swallow;Pharyngeal residue - valleculae;Pharyngeal residue - pyriform Pharyngeal Material enters airway, passes BELOW cords without attempt by patient to eject out (silent aspiration);Material enters airway, CONTACTS cords and not ejected out Pharyngeal- Thin Straw -- Pharyngeal -- Pharyngeal- Puree Penetration/Aspiration during swallow;Pharyngeal residue - valleculae;Pharyngeal residue - pyriform;Reduced epiglottic inversion;Reduced laryngeal elevation;Reduced  airway/laryngeal closure Pharyngeal Material enters airway, remains ABOVE vocal cords and not ejected out Pharyngeal- Mechanical Soft -- Pharyngeal -- Pharyngeal- Regular -- Pharyngeal -- Pharyngeal- Multi-consistency -- Pharyngeal -- Pharyngeal- Pill -- Pharyngeal -- Pharyngeal Comment --  CHL IP CERVICAL ESOPHAGEAL PHASE 04/10/2017 Cervical Esophageal Phase WFL Pudding Teaspoon -- Pudding Cup -- Honey Teaspoon -- Honey Cup -- Nectar Teaspoon -- Nectar Cup -- Nectar Straw -- Thin Teaspoon -- Thin Cup -- Thin Straw -- Puree -- Mechanical Soft -- Regular -- Multi-consistency -- Pill -- Cervical Esophageal Comment -- CHL IP GO 02/07/2014 Functional Assessment Tool Used mbs. clinical judgement Functional Limitations Swallowing Swallow Current Status [phone]) CI Swallow Goal Status (W2993) CI Swallow Discharge Status [phone]) CI Motor Speech Current Status [phone]) (None) Motor Speech Goal Status [phone]) (None) Motor Speech Goal Status (Z0258) (None) Spoken Language Comprehension Current Status [phone]) (None) Spoken Language Comprehension Goal Status (E4235) (None) Spoken Language Comprehension Discharge Status [phone]) (None) Spoken Language Expression Current Status [phone]) (None) Spoken Language Expression Goal Status [phone]) (None) Spoken Language Expression Discharge Status [phone]) (None) Attention Current Status (T2671) (None) Attention Goal Status (I4580) (None) Attention Discharge Status [phone]) (None) Memory Current Status (A2505) (None) Memory Goal Status (L9767) (None) Memory Discharge Status (H4193) (None) Voice Current Status (X9024) (None) Voice Goal Status (O9735) (None) Voice Discharge Status (H2992) (None) Other Speech-Language Pathology Functional Limitation Current Status (E2683) (None) Other Speech-Language Pathology Functional Limitation Goal Status (M1962) (None) Other Speech-Language Pathology Functional Limitation Discharge Status [phone]) (None) [name] 04/10/2017, 2:15 PM Orbie Pyo  Lit[place] M.Ed CCC-SLP Pager [phone]              US Abdomen Limited Ruq  Result Date: 04/04/2017 CLINICAL DATA:  Elevated LFTs. EXAM: ULTRASOUND ABDOMEN LIMITED RIGHT UPPER QUADRANT COMPARISON:  Contrast-enhanced CT 03/26/2017 FINDINGS: Gallbladder: Surgically absent. Common bile duct: Diameter: 5 mm, normal. Liver: Echotexture is heterogeneous increased. There is a hyperechoic 1.9 x 1.6 x 1.4 cm lesion in the subcapsular left hepatic lobe corresponding to lesion seen on prior CT. Portal vein is patent on color Doppler imaging with normal direction of blood flow towards the liver. Right pleural effusion is noted. IMPRESSION: 1. Heterogeneous increased hepatic echotexture suggests hepatic steatosis. 2. Hyperechoic subcapsular lesion in the left lobe of the liver measuring 1.9 cm, corresponding to lesion seen on prior CT. While this may reflect focal fatty infiltration, this is not definitively seen on more remote exams. Consider MRI characterization, however exam will only be diagnostic and only performed if patient is able tolerate breath hold technique. Alternatively, recommend follow-up ultrasound in 6 months. 3. Postcholecystectomy without biliary dilatation. Electronically Signed   By: Jeb Levering M.D.   On: 04/04/2017 02:25    Microbiology: No results found for this or any previous visit (from the past 240 hour(s)).   Labs: Basic Metabolic Panel: Recent Labs  Lab 04/20/17 0305 04/21/17 1740 04/22/17 0310 04/23/17 8144 04/24/17 0646 04/25/17 0559 04/26/17 0623  NA 136 136  --  137 138  --   --   K 4.7 4.0  --  3.9 4.1  --   --   CL 101 102  --  103 100*  --   --  CO2 25 23  --  25 25  --   --   GLUCOSE 110* 111*  --  165* 147*  --   --   BUN 18 16  --  15 15  --   --   CREATININE 0.95 0.86  --  0.85 0.81  --   --   CALCIUM 8.4* 8.4*  --  8.6* 8.6*  --   --   MG 2.7* 2.2 1.9 1.7 2.1 1.9 1.9   Liver Function Tests: No results for input(s): AST, ALT, ALKPHOS, BILITOT, PROT,  ALBUMIN in the last 168 hours. No results for input(s): LIPASE, AMYLASE in the last 168 hours. No results for input(s): AMMONIA in the last 168 hours. CBC: Recent Labs  Lab 04/20/17 0305 04/21/17 0538 04/23/17 0655  WBC 12.4* 8.5 7.1  HGB 12.2* 11.9* 11.6*  HCT 38.0* 37.2* 36.4*  MCV 93.6 93.9 94.5  PLT 209 235 252   Cardiac Enzymes: No results for input(s): CKTOTAL, CKMB, CKMBINDEX, TROPONINI in the last 168 hours. BNP: BNP (last 3 results) Recent Labs    04/07/17 1042  BNP 241.6*    ProBNP (last 3 results) No results for input(s): PROBNP in the last 8760 hours.  CBG: Recent Labs  Lab 04/25/17 1656 04/25/17 2130 04/25/17 2349 04/26/17 0356 04/26/17 0757  GLUCAP 98 129* 120* 155* 180*       Signed:  Irine Seal MD.  [facility] 04/26/2017, 11:45 AM

## 2017-04-26 NOTE — Progress Notes (Signed)
  Speech Language Pathology Treatment: Dysphagia  Patient Details Name: [name] MRN: [id number] DOB: Jun 12, 1932 Today's Date: 04/26/2017 Time: 7425-9563 SLP Time Calculation (min) (ACUTE ONLY): 8 min  Assessment / Plan / Recommendation Clinical Impression  Pt seen prior to discharge to SNF. Pt in bed with RN changing dressing around PEG; mouth breathing, dry and red mucosa due to what appears to be frequent use of Chloraseptic spray which will increase aspiration risk. Oral care provided removing some of red color/coating. Ice chip trial with delayed swallow initiation; he declined further trials requesting bathroom. Continue oral care and swallow intervention at SNF.    HPI HPI: Joshua Novacek Ludwickis a 84 y.o.malewith medical history significant ofafibnot on anticoagulation due to prior subdural hemorrhage,PE,hypertension, hyperlipidemia, diet-controlled diabetes, asthma, GERD, SDH, CAD,s/p ofCABGandstent placement,sCHFwith EF 35%, AICD placement, who presents with cough, chest pain and fever. T-max 102.1. Right lower lobe infiltrates. Admitted for HCAP complicated by influenza A infection. Pt has a history of mild oropharyngeal and cervical esophageal dysphagia per MBS 02/07/14; pharyngeal residuals noted across consistencies that liquid swallow help to clear but with resultant trace aspiration of liquids (thin and nectar). Regular diet with thin liquids was recommended as pt's weight was stable and no PNA since 2013. MBS recommended to fully evaluate oropharyngeal swallow.       SLP Plan  Continue with current plan of care       Recommendations  Diet recommendations: NPO Medication Administration: Via alternative means                Oral Care Recommendations: Oral care QID Follow up Recommendations: Skilled Nursing facility SLP Visit Diagnosis: Dysphagia, oropharyngeal phase (R13.12) Plan: Continue with current plan of care                       [name] 04/26/2017, 2:56 PM   [name].Ed [employer] [phone]

## 2017-04-26 NOTE — Telephone Encounter (Signed)
RX faxed to [facility] @ [phone], phone number [phone] 

## 2017-04-26 NOTE — Clinical Social Work Note (Signed)
CSW facilitated patient discharge including contacting patient family and facility to confirm patient discharge plans. Clinical information faxed to facility and family agreeable with plan. CSW arranged ambulance transport via [facility] to [facility]. RN to call report prior to discharge [phone] Room 121B).  CSW will sign off for now as social work intervention is no longer needed. Please consult [place] again if new needs arise.  Dayton Scrape, [place]

## 2017-04-26 NOTE — Progress Notes (Signed)
PT Cancellation Note  Patient Details Name: [name] MRN: [id number] DOB: March 16, 1932   Cancelled Treatment:    Reason Eval/Treat Not Completed: (P) Fatigue/lethargy limiting ability to participate(Pt resting comfortably unable to rouse to alert presentation.  Will f/u later this pm as time permits and patient presentation is appropriate.  )   [name] 04/26/2017, 11:36 AM  [name], PTA pager [phone]

## 2017-04-26 NOTE — Progress Notes (Signed)
Patient to be discharge this date to [facility]. All personal belongings with pt. Pt demonstrates no distress or discomfort at this time. G-tube patent and receiving tube feeds without difficulty. Tube flushes with ease.  Report called to facility. Transport will provided transportation for patient.

## 2017-04-26 NOTE — Clinical Social Work Placement (Signed)
   CLINICAL SOCIAL WORK PLACEMENT  NOTE  Date:  04/26/2017  Patient Details  Name: [name] MRN: [id number] Date of Birth: 1932-05-15  Clinical Social Work is seeking post-discharge placement for this patient at the [place] level of care (*CSW will initial, date and re-position this form in  chart as items are completed):  Yes   Patient/family provided with [facility] Work Department's list of facilities offering this level of care within the geographic area requested by the patient (or if unable, by the patient's family).  Yes   Patient/family informed of their freedom to choose among providers that offer the needed level of care, that participate in Medicare, Medicaid or managed care program needed by the patient, have an available bed and are willing to accept the patient.  Yes   Patient/family informed of [facility]'s ownership interest in [facility] and [facility], as well as of the fact that they are under no obligation to receive care at these facilities.  PASRR submitted to EDS on 04/18/17     PASRR number received on       Existing PASRR number confirmed on 04/18/17     FL2 transmitted to all facilities in geographic area requested by pt/family on 04/18/17     FL2 transmitted to all facilities within larger geographic area on       Patient informed that his/her managed care company has contracts with or will negotiate with certain facilities, including the following:        Yes   Patient/family informed of bed offers received.  Patient chooses bed at [facility])     Physician recommends and patient chooses bed at      Patient to be transferred to [facility]) on 04/26/17.  Patient to be transferred to facility by [facility]     Patient family notified on 04/26/17 of transfer.  Name of family member notified:  [name]     PHYSICIAN       Additional Comment:     _______________________________________________ Candie Chroman, LCSW 04/26/2017, 2:28 PM

## 2017-04-27 ENCOUNTER — Encounter: Payer: Self-pay | Admitting: Adult Health

## 2017-04-27 ENCOUNTER — Non-Acute Institutional Stay (SKILLED_NURSING_FACILITY): Payer: Medicare Other | Admitting: Adult Health

## 2017-04-27 ENCOUNTER — Other Ambulatory Visit: Payer: Self-pay | Admitting: Licensed Clinical Social Worker

## 2017-04-27 ENCOUNTER — Other Ambulatory Visit: Payer: Self-pay

## 2017-04-27 DIAGNOSIS — I110 Hypertensive heart disease with heart failure: Secondary | ICD-10-CM

## 2017-04-27 DIAGNOSIS — I4891 Unspecified atrial fibrillation: Secondary | ICD-10-CM

## 2017-04-27 DIAGNOSIS — K219 Gastro-esophageal reflux disease without esophagitis: Secondary | ICD-10-CM

## 2017-04-27 DIAGNOSIS — I25119 Atherosclerotic heart disease of native coronary artery with unspecified angina pectoris: Secondary | ICD-10-CM | POA: Diagnosis not present

## 2017-04-27 DIAGNOSIS — N183 Chronic kidney disease, stage 3 unspecified: Secondary | ICD-10-CM

## 2017-04-27 DIAGNOSIS — R1314 Dysphagia, pharyngoesophageal phase: Secondary | ICD-10-CM

## 2017-04-27 DIAGNOSIS — I5022 Chronic systolic (congestive) heart failure: Secondary | ICD-10-CM

## 2017-04-27 DIAGNOSIS — R410 Disorientation, unspecified: Secondary | ICD-10-CM | POA: Diagnosis not present

## 2017-04-27 NOTE — Patient Outreach (Signed)
Assessment:  CSW Theadore Nan received referral on [name] on 04/27/17.  However, client recently admitted to [facility] facility in [place], [place]. [name] is covering [facility] clients at [facility]. Thus, CSW Theadore Nan is transferring [facility] care for client, [name] [name], to [place] on 04/27/17.   [name].[name] MSW, LCSW Licensed Clinical Social Worker [facility] Care Management [phone]

## 2017-04-27 NOTE — Progress Notes (Signed)
Location:   [facility] Room Number: [place] of Service:  SNF (31)   CODE STATUS: Full Code  Allergies  Allergen Reactions  . Tamsulosin Other (See Comments)    Dizziness, Made BP very low and weakness  . Celebrex [Celecoxib] Hives and Nausea And Vomiting    Gi upset  . Dronedarone Nausea And Vomiting and Other (See Comments)    GI upset, abdominal pain  . Esomeprazole Magnesium Hives and Other (See Comments)    "don't really remember"  . Digoxin Diarrhea    May have caused some diarrhea  . Hydrocodone-Acetaminophen Other (See Comments)    Bad headache  . Protonix [Pantoprazole Sodium] Nausea And Vomiting and Other (See Comments)    Tolerates Dexilant    Chief Complaint  Patient presents with  . Hospitalization Follow-up    Hospital Follow up    HPI:  He is an 84 year old who has been hospitalized for acute respiratory failure secondary to pneumonia, influenza A . He has chronic on acute aspiration, now npo and has had a peg tube placed. This AM he was found to have water in his room. He had drank some; his 02 sats were low on room air and did recover with 02 placed. There are no indications of infectious process present at this time. He denies coughing; no shortness of breath. He is tolerating his tube feeding without difficulty.   He is here for short term rehab with his goal to return back home. He will continue to be followed for his chronic illnesses: afib; cad; dysphagia. No nursing concerns at this time.    Past Medical History:  Diagnosis Date  . Abnormal thyroid scan    Abnormal thyroid imaging studies from 11/09/2010, status post ultrasound guided fine needle aspiration of the dominant left inferior thyroid nodule on 12/15/2010. Cytology report showed rare follicular epithelial cells and hemosiderin laden macrophages.  . Adenomatous colon polyp   . AICD (automatic cardioverter/defibrillator) present    a. fx lead; a. s/p lead extraction  03/02/17  . Arthritis    "all over"  . Asthma   . Atrial fibrillation ([place])    on chronic Coumadin; stopped July 2013 due to subdural hematomas  . CHF (congestive heart failure) (HCC)    EF 35-40% s/p most recent ICD generator change-out with Medtronic dual-chamber ICD 05/20/11 with explantation of previous abdominally-implanted device  . Coronary artery disease    s/p CABG 1983 and PCI/stent 2004.   . Diabetes mellitus    diet controlled  . Diverticulosis   . Dyslipidemia   . Enteritis   . Erythrocytosis   . GERD (gastroesophageal reflux disease)   . Hepatic steatosis   . Hypertension   . Ischemic cardiomyopathy    WITH CHF  . Monocytosis 04/17/2013  . Myocardial infarction ([place]) 1983; ~ 1990  . Pleural effusion    right  . Pneumonia August 2013  . Portal hypertensive gastropathy ([place])   . Rectal ulcer   . Renal calculi   . Subdural hematoma [facility]) July 2013   Anticoagulation stopped.   . SunDown syndrome   . VT (ventricular tachycardia) ([place])     Past Surgical History:  Procedure Laterality Date  . CHOLECYSTECTOMY    . COLONOSCOPY  07/07/2011   Procedure: COLONOSCOPY;  Surgeon: Jerene Bears, MD;  Location: [facility];  Service: Gastroenterology;  Laterality: N/A;  . CORONARY ANGIOPLASTY WITH STENT PLACEMENT  2004   Tandem Cypher stents LAD  .  CORONARY ARTERY BYPASS GRAFT  1983   SVG-mLAD  . ESOPHAGOGASTRODUODENOSCOPY  02/11/2011   Procedure: ESOPHAGOGASTRODUODENOSCOPY (EGD);  Surgeon: Beryle Beams, MD;  Location: [facility];  Service: Endoscopy;  Laterality: N/A;  . FLEXIBLE SIGMOIDOSCOPY N/A 02/08/2017   Procedure: FLEXIBLE SIGMOIDOSCOPY;  Surgeon: Irene Shipper, MD;  Location: [facility];  Service: Endoscopy;  Laterality: N/A;  . ICD GENERATOR CHANGEOUT N/A 04/08/2016   Procedure: ICD Generator Changeout;  Surgeon: Evans Lance, MD;  Location: [place] [facility];  Service: Cardiovascular;  Laterality: N/A;  . ICD LEAD REMOVAL N/A 03/02/2017   Procedure: ICD LEAD  REMOVAL;  Surgeon: Evans Lance, MD;  Location: [place];  Service: Cardiovascular;  Laterality: N/A;  . IMPLANTABLE CARDIOVERTER DEFIBRILLATOR (ICD) GENERATOR CHANGE N/A 05/20/2011   Procedure: ICD GENERATOR CHANGE;  Surgeon: Evans Lance, MD;  Medtronic secure dual-chamber ICD serial number [id number]   . IR GASTROSTOMY TUBE MOD SED  04/19/2017  . KNEE ARTHROSCOPY     right; "just went in and scraped it"  . LEAD INSERTION N/A 03/02/2017   Procedure: LEAD INSERTION;  Surgeon: Evans Lance, MD;  Location: [place];  Service: Cardiovascular;  Laterality: N/A;  . MASS EXCISION Right 05/10/2013   Procedure: EXCISION MASS RIGHT THUMB;  Surgeon: Wynonia Sours, MD;  Location: [place];  Service: Orthopedics;  Laterality: Right;  . PROXIMAL INTERPHALANGEAL FUSION (PIP) Right 05/10/2013   Procedure: DEBRIDEMENT PROXIMAL INTERPHALANGEAL FUSION (PIP);  Surgeon: Wynonia Sours, MD;  Location: [place];  Service: Orthopedics;  Laterality: Right;  . TONSILLECTOMY          Social History   Socioeconomic History  . Marital status: Divorced    Spouse name: Not on file  . Number of children: 2  . Years of education: Not on file  . Highest education level: Not on file  Occupational History  . Occupation: Sports coach: RETIRED  Social Needs  . Financial resource strain: Not on file  . Food insecurity:    Worry: Not on file    Inability: Not on file  . Transportation needs:    Medical: Not on file    Non-medical: Not on file  Tobacco Use  . Smoking status: Former Smoker    Packs/day: 2.00    Years: 30.00    Pack years: 60.00    Types: Cigarettes    Last attempt to quit: 06/23/1976    Years since quitting: 40.8  . Smokeless tobacco: Never Used  Substance and Sexual Activity  . Alcohol use: No  . Drug use: No  . Sexual activity: Never  Lifestyle  . Physical activity:    Days per week: Not on file    Minutes per session: Not on file  . Stress: Not on  file  Relationships  . Social connections:    Talks on phone: Not on file    Gets together: Not on file    Attends religious service: Not on file    Active member of club or organization: Not on file    Attends meetings of clubs or organizations: Not on file    Relationship status: Not on file  . Intimate partner violence:    Fear of current or ex partner: Not on file    Emotionally abused: Not on file    Physically abused: Not on file    Forced sexual activity: Not on file  Other Topics Concern  . Not on file  Social History Narrative  . Not on file   Family History  Problem Relation Age of Onset  . Tuberculosis Mother   . Tuberculosis Father   . Heart disease Brother   . Diabetes Sister   . Diabetes Brother   . Clotting disorder Brother       VITAL SIGNS BP (!) 146/81   Pulse (!) 105   Temp 97.7 F (36.5 C)   Resp (!) 22   Ht 6\' 1"  (1.854 m)   Wt 164 lb 1.6 oz (74.4 kg)   SpO2 94%   BMI 21.65 kg/m   Outpatient Encounter Medications as of 04/27/2017  Medication Sig  . acetaminophen (TYLENOL) 650 MG suppository Place 1 suppository (650 mg total) rectally every 6 (six) hours as needed for fever or mild pain (T over 101).  . ADVAIR DISKUS 250-50 MCG/DOSE AEPB Inhale 1 puff into the lungs 2 (two) times daily.   . digoxin (LANOXIN) 0.05 MG/ML solution Place 2.5 mLs (0.125 mg total) into feeding tube daily.  . fluticasone (FLONASE) 50 MCG/ACT nasal spray Place 1 spray into both nostrils daily as needed for allergies.  . furosemide (LASIX) 8 MG/ML solution Place 2.5 mLs (20 mg total) into feeding tube daily.  Marland Kitchen gabapentin (NEURONTIN) 100 MG capsule Give 1 Capsule (100mg ) to 3 capsules (300mg ) enterally at bedtime as needed  . ipratropium-albuterol (DUONEB) 0.5-2.5 (3) MG/3ML SOLN Take 3 mLs by nebulization every 4 (four) hours as needed.  . Lisinopril 1 MG/ML SOLN Place 2.5 mg into feeding tube daily.  Marland Kitchen loratadine (CLARITIN) 10 MG tablet Take 1 tablet (10 mg total) by  mouth daily as needed for allergies.  Marland Kitchen menthol-cetylpyridinium (CEPACOL) 3 MG lozenge Take 1 lozenge (3 mg total) by mouth as needed for sore throat.  . metoprolol tartrate (LOPRESSOR) 25 mg/10 mL SUSP Place 10 mLs (25 mg total) into feeding tube 2 (two) times daily.  Marland Kitchen neomycin-bacitracin-polymyxin (NEOSPORIN) OINT Apply 1 application topically daily for 4 days.  . nitroGLYCERIN (NITROSTAT) 0.4 MG SL tablet Place 1 tablet (0.4 mg total) under the tongue every 5 (five) minutes as needed for chest pain.  . Nutritional Supplements (FEEDING SUPPLEMENT, JEVITY 1.5 CAL/FIBER,) LIQD Place 240 mLs into feeding tube 6 (six) times daily.  Marland Kitchen OLANZapine zydis (ZYPREXA) 5 MG disintegrating tablet Take 1 tablet (5 mg total) by mouth at bedtime.  . pantoprazole sodium (PROTONIX) 40 mg/20 mL PACK Place 20 mLs (40 mg total) into feeding tube daily.  . phenol (CHLORASEPTIC) 1.4 % LIQD Use as directed 1 spray in the mouth or throat as needed for throat irritation / pain.  . potassium chloride 20 MEQ/15ML (10%) SOLN Place 7.5 mLs (10 mEq total) into feeding tube daily.  . RESTASIS 0.05 % ophthalmic emulsion INSTILL 1 DROP INTO BOTH EYES TWICE A DAY  . sennosides (SENOKOT) 8.8 MG/5ML syrup Place 5 mLs into feeding tube 2 (two) times daily.  . traMADol (ULTRAM) 5 mg/mL SUSP Place 10-20 mLs (50-100 mg total) into feeding tube every 6 (six) hours as needed for moderate pain.  . Water For Irrigation, Sterile (FREE WATER) SOLN Place 120 mLs into feeding tube 6 (six) times daily.  . [DISCONTINUED] gabapentin (NEURONTIN) 100 MG capsule TAKE 1 CAPSULE (100MG ) TO 3 CAPSULES (300MG ) EACH NIGHT AS NEEDED FOR MUSCLE PAINS (Patient not taking: Reported on 04/27/2017)   No facility-administered encounter medications on file as of 04/27/2017.      SIGNIFICANT DIAGNOSTIC EXAMS  TODAY:   04-07-17: chest x-ray: Suspected patchy  infiltrate at the right base, short interval radiographic follow-up suggested to ensure clearing.  Cardiomegaly.  04-09-17: chest x-ray: COPD, cardiomegaly, stable. Increasing right basilar airspace opacity and small right effusion concerning for pneumonia  04-10-17: swallow study: Recommend NPO with short term alternative nutrition and continued ST   04-19-17: PEG tube insertion   LABS REVIEWED: TODAY:   04-07-17: wbc 9.5; hgb 13.4; hct 41.9; mcv 94.2; plt 101; glucose 84; bun 18; creat 1.28 ;k+ 4.0; na++ 136; ca 8.2; total bili 2.5; albumin 3.2; rapid flu: +A; blood culture: no growth; urine culture: <10,000 colonies 04-09-17: wbc 9.0; hgb 11.5; hct 35.1; mcv 91.9; plt 120; glucose 88; bun 9; creat 0.86; k+ 3.5; na++ 133; ca 7.5; mag 1.7 04-13-17: wbc 9.4; hgb 13.8; hct 42.6; mcv 94.0; plt 217; glucose 115; bun 11; creat 1.04 ;k+ 3.3; na++144; ca 8.7 04-16-17: wbc 6.5; hgb 12.2; hct 38.5; mcv 95.3; plt 185; glucose 115; bun 11; creat 0.78; k+ 4.0; na++ 143; ca 8.9; mag 1.7  04-20-17:wbc 12.4; hgb 12.2; hct 38.0; mcv 93.6; plt 209; glucose 110; bun 18; creat 0.95; k+ 4.7; na++ 136; ca 8.4; mag 2.7;  04-22-17: mag 1.9   Review of Systems  Constitutional: Negative for malaise/fatigue.  Respiratory: Negative for cough and shortness of breath.   Cardiovascular: Negative for chest pain, palpitations and leg swelling.  Gastrointestinal: Negative for abdominal pain, constipation and heartburn.  Musculoskeletal: Negative for back pain, joint pain and myalgias.  Skin: Negative.   Neurological: Negative for dizziness.  Psychiatric/Behavioral: The patient is not nervous/anxious.     Physical Exam  Constitutional: He is oriented to person, place, and time. He appears well-developed and well-nourished. No distress.  Thin   Neck: No thyromegaly present.  Cardiovascular: Intact distal pulses.  Murmur heard. 1/6' heart rate irregular and slightly tachycardic at low 100s  Pulmonary/Chest: Effort normal and breath sounds normal. No respiratory distress.  02 sat on room air 84 % on 2 L/[place] 95 %    Abdominal: Soft. Bowel sounds are normal. He exhibits no distension. There is no tenderness.  Peg tube present without signs of infection present   Musculoskeletal: He exhibits no edema.  Is able to move all extremities   Lymphadenopathy:    He has no cervical adenopathy.  Neurological: He is alert and oriented to person, place, and time.  Skin: Skin is warm and dry. He is not diaphoretic.  Bilateral lower extremities discolored   Psychiatric: He has a normal mood and affect.     ASSESSMENT/ PLAN:  TODAY;   1. Chronic systolic heart failure: EF 35-40%: is status post AICD placement:  is stable will continue lasix 20 mg daily lopressor 25 mg twice daily and will monitor his status.   2. Atrial fibrillation with controlled ventricular response: has history of PE  heart rate stable: is not a candidate for anticoagulations due to history of subdural hematoma: will continue lopressor 25 mg twice daily and digoxin 0.125 mg daily   3. Coronary artery disease involving native coronary artery with angina pectoris: is status post CABG/ stent placements: is stable will continue lopressor 25 mg twice daily and has prn ntg.   4. Hypertensive heart disease with heart failure: stable b/p 146/81: will continue lopressor 25 mg twice daily and lisinopril 2.5 mg daily   5. Acute respiratory failure with hypoxia: is worse: is now on 02 at 2L/[place] to keep sats >91 %. Will continue advair 250/50 twice daily duoneb every 4 hours as needed  6. Chronic allergic rhinitis: stable will continue claritin daily as needed and flonase daily as needed  7. Polyneuropathy : is stable has neurontin 300 mg nightly as needed  8. Gastroesophageal reflux disease without esophagitis: stable will continue protonix 40 mg daily    9. Chronic constipation: stable will continue senna twice daily   10.  Pharyngoesophageal dysphagia: has chronic aspiration: is currently NPO and is receiving jevity 240 mL 6 times per peg tube.    11. CKD stage III: stable bun 18/creat 0.95  12. Acute confusion: due to acute metabolic encephalopathy is stable will continue zyprexa 5 mg nightly; will need to monitor his status; and should be able to wean this off slowly  13.  Adult failure to thrive: weight is 164 pounds; albumin is 3.2; without change in status; will continue to monitor    Will setup palliative care consult.    MD is aware of resident's narcotic use and is in agreement with current plan of care. We will attempt to wean resident as apropriate   Ok Edwards NP [facility]  Contact [phone] Monday through Friday 8am- 5pm  After hours call [phone]

## 2017-05-01 ENCOUNTER — Telehealth: Payer: Self-pay

## 2017-05-01 ENCOUNTER — Non-Acute Institutional Stay (SKILLED_NURSING_FACILITY): Payer: Medicare Other | Admitting: Internal Medicine

## 2017-05-01 ENCOUNTER — Encounter: Payer: Self-pay | Admitting: Internal Medicine

## 2017-05-01 DIAGNOSIS — S41112A Laceration without foreign body of left upper arm, initial encounter: Secondary | ICD-10-CM | POA: Diagnosis not present

## 2017-05-01 DIAGNOSIS — E041 Nontoxic single thyroid nodule: Secondary | ICD-10-CM | POA: Diagnosis not present

## 2017-05-01 DIAGNOSIS — F4489 Other dissociative and conversion disorders: Secondary | ICD-10-CM | POA: Diagnosis not present

## 2017-05-01 DIAGNOSIS — Z95810 Presence of automatic (implantable) cardiac defibrillator: Secondary | ICD-10-CM

## 2017-05-01 DIAGNOSIS — I5022 Chronic systolic (congestive) heart failure: Secondary | ICD-10-CM | POA: Diagnosis not present

## 2017-05-01 DIAGNOSIS — Z931 Gastrostomy status: Secondary | ICD-10-CM | POA: Diagnosis not present

## 2017-05-01 DIAGNOSIS — I482 Chronic atrial fibrillation, unspecified: Secondary | ICD-10-CM

## 2017-05-01 DIAGNOSIS — J181 Lobar pneumonia, unspecified organism: Secondary | ICD-10-CM | POA: Diagnosis not present

## 2017-05-01 DIAGNOSIS — K219 Gastro-esophageal reflux disease without esophagitis: Secondary | ICD-10-CM | POA: Diagnosis not present

## 2017-05-01 DIAGNOSIS — J9611 Chronic respiratory failure with hypoxia: Secondary | ICD-10-CM

## 2017-05-01 DIAGNOSIS — I110 Hypertensive heart disease with heart failure: Secondary | ICD-10-CM | POA: Diagnosis not present

## 2017-05-01 DIAGNOSIS — J189 Pneumonia, unspecified organism: Secondary | ICD-10-CM

## 2017-05-01 NOTE — Telephone Encounter (Signed)
Palliative Medicine RN Note: Rec'd a call from pt's friend Melvyn Novas about missing blue walker and electric shavers (she states SNF told her they went missing between [place]). I spoke w [facility] staff, who reports that ambulance would not take equipment and that SNF sent someone to get the belongings the day after d/c (so picked up Thursday 3/28); staff spoke with pt's son at that time.  I called Ann back; no answer. Left message that she needs to call his son to discuss this, as his son is aware of the situation.  Marjie Skiff Sabre Leonetti, RN, BSN, [facility] Palliative Medicine Team 05/01/2017 11:17 AM Office [phone]

## 2017-05-01 NOTE — Progress Notes (Signed)
Patient ID: [name], male   DOB: [birth year], 84 y.o.   MRN: [id number]  Provider:  DR Arletha Grippe Location:  [place] Room Number: [place] of Service:  SNF ([phone])  PCP: [name], MD Patient Care Team: [name], MD as PCP - General (Family Medicine) [name], Peter M, MD as PCP - Cardiology (Cardiology) Heath Lark, MD as Consulting Physician (Hematology and Oncology)  Extended Emergency Contact Information Primary Emergency Contact: [place] Phone: [phone] Relation: Son  Code Status: Full Code Goals of Care: Advanced Directive information Advanced Directives 05/01/2017  Does Patient Have a Medical Advance Directive? No  Type of Advance Directive -  Does patient want to make changes to medical advance directive? -  Copy of [place] in Chart? -  Would patient like information on creating a medical advance directive? No - Patient declined  Pre-existing out of facility DNR order (yellow form or pink MOST form) -      Chief Complaint  Patient presents with  . New Admit To SNF    Admission    HPI: Patient is a 84 y.o. male seen today for admission to SNF following hospital stay for acute respiratory failure with hypoxia, HCAP, influenza A, moderate malnutrition, FTT, chronic afib, chronic sHF (EF 35-40%) s/p AICD, CAD s/p CABG, thrombocytopenia, dysphagia, GERD, sepsis, CKD stage 3. CXR (+) RLL infiltrate with O2 sat 83% on RA. Temp 102.92F. Flu PCR (+) A. He was tx with IV cefepime x 5 days and po tamiflu x 5 days. ST evaluated for dysphagia and MBS 04/10/17 showed moderate aspiration risk. NPO recommended and Coretrack placed 04/13/17 was dislodged. Palliative care consulted and Peg tube recommended. IR placed Peg tube 04/19/17 and TF initiated. Hgb 11.9; WBC peaked 12.4K--->8.5K; Digoxin 0.7; Cr 1.25-->0.81; albumin 3.2; T bili 2.5; K dropped 3.1-->4.1; glucose 130-140s at d/c. He  presents to SNF for short term rehab.  Today he reports left forearm wound sustained earlier this weekend. No falls. He gets TF for nutrition. No issues with d/c or redness at insertion site. No f/c. He still has a cough, nonproductive. He is a poor historian due to confusion. Hx obtained from chart.  Reviewed images from CXR 04/07/17 - enlarged heart, RLL infiltrate, sternotomy changes , AICD visualized  Chronic systolic heart failure - EF 35-40%; s/p AICD; stable on lasix 20 mg daily; lopressor 25 mg twice daily  Chronic afib - rate controlled on lopressor 25 mg twice daily and digoxin 0.125 mg daily; not a candidate for anticoagulations due to history of subdural hematoma.  Dig level 0.7  Coronary artery disease involving native coronary artery with angina pectoris - s/p CABG/ stents; stable on lopressor 25 mg twice daily and prn ntg.   HTN - BP stable on lopressor 25 mg twice daily and lisinopril 2.5 mg daily   Chronic respiratory failure with hypoxia/hx asthma - on  O2 at 2L/min to keep sats >91 %; advair 250/50 twice daily; duoneb every 4 hours prn wheeze/cough  Chronic allergic rhinitis - stable on claritin daily as needed and flonase daily as needed  Polyneuropathy - idiopathic; stable on neurontin 300 mg nightly as needed  GERD - stable on protonix 40 mg daily    Chronic constipation - stable on senna twice daily   Pharyngoesophageal dysphagia with chronic aspiration - remains NPO and gets TF Jevity 240 mL 6 times via Peg tube  CKD - stage  3 initially. Now with nml Cr 0.81  Confusion - improved on zyprexa 5 mg nightly  FTT/dysphagia s/p Peg - weight 164 lbs. Albumin 3.2. He gets TF for nutrition.    Thyroid nodule - stable. TSH 0.553   Past Medical History:  Diagnosis Date  . Abnormal thyroid scan    Abnormal thyroid imaging studies from 11/09/2010, status post ultrasound guided fine needle aspiration of the dominant left inferior thyroid nodule on 12/15/2010. Cytology  report showed rare follicular epithelial cells and hemosiderin laden macrophages.  . Adenomatous colon polyp   . AICD (automatic cardioverter/defibrillator) present    a. fx lead; a. s/p lead extraction 03/02/17  . Arthritis    "all over"  . Asthma   . Atrial fibrillation ([place])    on chronic Coumadin; stopped July 2013 due to subdural hematomas  . CHF (congestive heart failure) (HCC)    EF 35-40% s/p most recent ICD generator change-out with Medtronic dual-chamber ICD 05/20/11 with explantation of previous abdominally-implanted device  . Coronary artery disease    s/p CABG 1983 and PCI/stent 2004.   . Diabetes mellitus    diet controlled  . Diverticulosis   . Dyslipidemia   . Enteritis   . Erythrocytosis   . GERD (gastroesophageal reflux disease)   . Hepatic steatosis   . Hypertension   . Ischemic cardiomyopathy    WITH CHF  . Monocytosis 04/17/2013  . Myocardial infarction ([place]) 1983; ~ 1990  . Pleural effusion    right  . Pneumonia August 2013  . Portal hypertensive gastropathy ([place])   . Rectal ulcer   . Renal calculi   . Subdural hematoma [facility]) July 2013   Anticoagulation stopped.   . SunDown syndrome   . VT (ventricular tachycardia) ([place])    Past Surgical History:  Procedure Laterality Date  . CHOLECYSTECTOMY    . COLONOSCOPY  07/07/2011   Procedure: COLONOSCOPY;  Surgeon: Jerene Bears, MD;  Location: [facility];  Service: Gastroenterology;  Laterality: N/A;  . CORONARY ANGIOPLASTY WITH STENT PLACEMENT  2004   Tandem Cypher stents LAD  . CORONARY ARTERY BYPASS GRAFT  1983   SVG-mLAD  . ESOPHAGOGASTRODUODENOSCOPY  02/11/2011   Procedure: ESOPHAGOGASTRODUODENOSCOPY (EGD);  Surgeon: Beryle Beams, MD;  Location: [facility];  Service: Endoscopy;  Laterality: N/A;  . FLEXIBLE SIGMOIDOSCOPY N/A 02/08/2017   Procedure: FLEXIBLE SIGMOIDOSCOPY;  Surgeon: Irene Shipper, MD;  Location: [facility];  Service: Endoscopy;  Laterality: N/A;  . ICD GENERATOR CHANGEOUT N/A  04/08/2016   Procedure: ICD Generator Changeout;  Surgeon: Evans Lance, MD;  Location: [place] [facility];  Service: Cardiovascular;  Laterality: N/A;  . ICD LEAD REMOVAL N/A 03/02/2017   Procedure: ICD LEAD REMOVAL;  Surgeon: Evans Lance, MD;  Location: [place];  Service: Cardiovascular;  Laterality: N/A;  . IMPLANTABLE CARDIOVERTER DEFIBRILLATOR (ICD) GENERATOR CHANGE N/A 05/20/2011   Procedure: ICD GENERATOR CHANGE;  Surgeon: Evans Lance, MD;  Medtronic secure dual-chamber ICD serial number [id number]   . IR GASTROSTOMY TUBE MOD SED  04/19/2017  . KNEE ARTHROSCOPY     right; "just went in and scraped it"  . LEAD INSERTION N/A 03/02/2017   Procedure: LEAD INSERTION;  Surgeon: Evans Lance, MD;  Location: [place];  Service: Cardiovascular;  Laterality: N/A;  . MASS EXCISION Right 05/10/2013   Procedure: EXCISION MASS RIGHT THUMB;  Surgeon: Wynonia Sours, MD;  Location: [place];  Service: Orthopedics;  Laterality: Right;  . PROXIMAL INTERPHALANGEAL FUSION (  PIP) Right 05/10/2013   Procedure: DEBRIDEMENT PROXIMAL INTERPHALANGEAL FUSION (PIP);  Surgeon: Wynonia Sours, MD;  Location: [place];  Service: Orthopedics;  Laterality: Right;  . TONSILLECTOMY          reports that he quit smoking about 40 years ago. His smoking use included cigarettes. He has a 60.00 pack-year smoking history. He has never used smokeless tobacco. He reports that he does not drink alcohol or use drugs. Social History   Socioeconomic History  . Marital status: Divorced    Spouse name: Not on file  . Number of children: 2  . Years of education: Not on file  . Highest education level: Not on file  Occupational History  . Occupation: Sports coach: RETIRED  Social Needs  . Financial resource strain: Not on file  . Food insecurity:    Worry: Not on file    Inability: Not on file  . Transportation needs:    Medical: Not on file    Non-medical: Not on file  Tobacco Use    . Smoking status: Former Smoker    Packs/day: 2.00    Years: 30.00    Pack years: 60.00    Types: Cigarettes    Last attempt to quit: 06/23/1976    Years since quitting: 40.8  . Smokeless tobacco: Never Used  Substance and Sexual Activity  . Alcohol use: No  . Drug use: No  . Sexual activity: Never  Lifestyle  . Physical activity:    Days per week: Not on file    Minutes per session: Not on file  . Stress: Not on file  Relationships  . Social connections:    Talks on phone: Not on file    Gets together: Not on file    Attends religious service: Not on file    Active member of club or organization: Not on file    Attends meetings of clubs or organizations: Not on file    Relationship status: Not on file  . Intimate partner violence:    Fear of current or ex partner: Not on file    Emotionally abused: Not on file    Physically abused: Not on file    Forced sexual activity: Not on file  Other Topics Concern  . Not on file  Social History Narrative  . Not on file    Functional Status Survey:    Family History  Problem Relation Age of Onset  . Tuberculosis Mother   . Tuberculosis Father   . Heart disease Brother   . Diabetes Sister   . Diabetes Brother   . Clotting disorder Brother     Health Maintenance  Topic Date Due  . INFLUENZA VACCINE  08/31/2017  . TETANUS/TDAP  04/02/2027  . PNA vac Low Risk Adult  Discontinued    Allergies  Allergen Reactions  . Tamsulosin Other (See Comments)    Dizziness, Made BP very low and weakness  . Celebrex [Celecoxib] Hives and Nausea And Vomiting    Gi upset  . Dronedarone Nausea And Vomiting and Other (See Comments)    GI upset, abdominal pain  . Esomeprazole Magnesium Hives and Other (See Comments)    "don't really remember"  . Digoxin Diarrhea    May have caused some diarrhea  . Hydrocodone-Acetaminophen Other (See Comments)    Bad headache  . Protonix [Pantoprazole Sodium] Nausea And Vomiting and Other (See  Comments)    Tolerates Dexilant    Outpatient Encounter  Medications as of 05/01/2017  Medication Sig  . acetaminophen (TYLENOL) 650 MG suppository Place 1 suppository (650 mg total) rectally every 6 (six) hours as needed for fever or mild pain (T over 101).  . ADVAIR DISKUS 250-50 MCG/DOSE AEPB Inhale 1 puff into the lungs 2 (two) times daily.   . digoxin (LANOXIN) 0.05 MG/ML solution Place 2.5 mLs (0.125 mg total) into feeding tube daily.  . fluticasone (FLONASE) 50 MCG/ACT nasal spray Place 1 spray into both nostrils daily as needed for allergies.  . furosemide (LASIX) 8 MG/ML solution Place 2.5 mLs (20 mg total) into feeding tube daily.  Marland Kitchen gabapentin (NEURONTIN) 100 MG capsule Give 1 Capsule (100mg ) to 3 capsules (300mg ) enterally at bedtime as needed  . ipratropium-albuterol (DUONEB) 0.5-2.5 (3) MG/3ML SOLN Take 3 mLs by nebulization every 4 (four) hours as needed.  . Lisinopril 1 MG/ML SOLN Place 2.5 mg into feeding tube daily.  Marland Kitchen loratadine (CLARITIN) 10 MG tablet Take 1 tablet (10 mg total) by mouth daily as needed for allergies.  Marland Kitchen menthol-cetylpyridinium (CEPACOL) 3 MG lozenge Take 1 lozenge (3 mg total) by mouth as needed for sore throat.  . metoprolol tartrate (LOPRESSOR) 25 mg/10 mL SUSP Place 10 mLs (25 mg total) into feeding tube 2 (two) times daily.  Marland Kitchen neomycin-bacitracin-polymyxin (NEOSPORIN) OINT Apply 1 application topically daily for 4 days.  . nitroGLYCERIN (NITROSTAT) 0.4 MG SL tablet Place 1 tablet (0.4 mg total) under the tongue every 5 (five) minutes as needed for chest pain.  . Nutritional Supplements (FEEDING SUPPLEMENT, JEVITY 1.5 CAL,) LIQD 90cc / hr on at 1800 x 16 hours  . Nutritional Supplements (NUTRITIONAL SUPPLEMENT PO) NPO - nothing by mouth diet, Nothing by mouth textrue  . OLANZapine zydis (ZYPREXA) 5 MG disintegrating tablet Take 1 tablet (5 mg total) by mouth at bedtime.  . pantoprazole sodium (PROTONIX) 40 mg/20 mL PACK Place 20 mLs (40 mg total) into  feeding tube daily.  . phenol (CHLORASEPTIC) 1.4 % LIQD Use as directed 1 spray in the mouth or throat as needed for throat irritation / pain.  . potassium chloride 20 MEQ/15ML (10%) SOLN Place 7.5 mLs (10 mEq total) into feeding tube daily.  . RESTASIS 0.05 % ophthalmic emulsion INSTILL 1 DROP INTO BOTH EYES TWICE A DAY  . sennosides (SENOKOT) 8.8 MG/5ML syrup Place 5 mLs into feeding tube 2 (two) times daily.  . traMADol (ULTRAM) 5 mg/mL SUSP Place 10-20 mLs (50-100 mg total) into feeding tube every 6 (six) hours as needed for moderate pain.  . Water For Irrigation, Sterile (FREE WATER) SOLN Place into feeding tube every 8 (eight) hours. 125 ml H2o flushes via tube every 4 hours  . [DISCONTINUED] Nutritional Supplements (FEEDING SUPPLEMENT, JEVITY 1.5 CAL/FIBER,) LIQD Place 240 mLs into feeding tube 6 (six) times daily. (Patient not taking: Reported on 05/01/2017)  . [DISCONTINUED] Water For Irrigation, Sterile (FREE WATER) SOLN Place 120 mLs into feeding tube 6 (six) times daily. (Patient not taking: Reported on 05/01/2017)   No facility-administered encounter medications on file as of 05/01/2017.     Review of Systems  Unable to perform ROS: Other (confusion)    Vitals:   05/01/17 0933  BP: 113/66  Pulse: 92  Resp: 20  Temp: 98.5 F (36.9 C)  SpO2: 93%  Weight: 164 lb 1.6 oz (74.4 kg)  Height: 6\' 1"  (1.854 m)   Body mass index is 21.65 kg/m. Physical Exam  Constitutional: He appears well-developed.    Frail appearing in NAD,sitting  in w/c. [place] O2 not intact  HENT:  Mouth/Throat: Oropharynx is clear and moist.  MM dry; no oral thrush  Eyes: Pupils are equal, round, and reactive to light. No scleral icterus.  Neck: Neck supple. Carotid bruit is not present. Thyromegaly present.  Cardiovascular: Normal rate and intact distal pulses. An irregularly irregular rhythm present. Exam reveals no gallop and no friction rub.  Murmur (1/6 SEM) heard. No distal LE edema. No calf TTP    Pulmonary/Chest: Effort normal. He has no wheezes. He has rales (right). He exhibits no tenderness.  Left ACW AICD palpable  Abdominal: Soft. Normal appearance and bowel sounds are normal. He exhibits no distension, no abdominal bruit, no pulsatile midline mass and no mass. There is no hepatomegaly. There is no tenderness. There is no rigidity, no rebound and no guarding. No hernia.  Peg tube intact with no redness or d/c at insertion site  Musculoskeletal: He exhibits edema (right knee) and tenderness.  Lymphadenopathy:    He has no cervical adenopathy.  Neurological: He is alert.  Skin: Skin is warm and dry. No rash noted.  Psychiatric: He has a normal mood and affect. His behavior is normal.    Labs reviewed: Basic Metabolic Panel: Recent Labs    04/21/17 0538  04/23/17 0655 04/24/17 0646 04/25/17 0559 04/26/17 0623  NA 136  --  137 138  --   --   K 4.0  --  3.9 4.1  --   --   CL 102  --  103 100*  --   --   CO2 23  --  25 25  --   --   GLUCOSE 111*  --  165* 147*  --   --   BUN 16  --  15 15  --   --   CREATININE 0.86  --  0.85 0.81  --   --   CALCIUM 8.4*  --  8.6* 8.6*  --   --   MG 2.2   < > 1.7 2.1 1.9 1.9   < > = values in this interval not displayed.   Liver Function Tests: Recent Labs    04/03/17 0836 04/04/17 0449 04/07/17 0236  AST 35  33 24 33  ALT 11*  13* 11* 16*  ALKPHOS 55  49 46 50  BILITOT 2.6*  2.7* 1.6* 2.5*  PROT 6.4*  6.3* 5.9* 6.2*  ALBUMIN 3.6  3.4* 3.1* 3.2*   Recent Labs    03/23/17 0622 03/24/17 1540 03/26/17 1858  LIPASE 27 26 28    No results for input(s): AMMONIA in the last 8760 hours. CBC: Recent Labs    03/23/17 0622  04/01/17 1424  04/07/17 0236  04/20/17 0305 04/21/17 0538 04/23/17 0655  WBC 7.1   < > 9.1   < > 9.5   < > 12.4* 8.5 7.1  NEUTROABS 3.7  --  5.1  --  5.4  --   --   --   --   HGB 13.7   < > 14.1   < > 13.4   < > 12.2* 11.9* 11.6*  HCT 41.9   < > 42.7   < > 41.9   < > 38.0* 37.2* 36.4*  MCV 92.7    < > 92.6   < > 94.2   < > 93.6 93.9 94.5  PLT 176   < > 113*   < > 101*   < > 209 235 252   < > = values  in this interval not displayed.   Cardiac Enzymes: Recent Labs    04/01/17 1209  04/17/17 2035 04/18/17 0206 04/18/17 0744  CKTOTAL 262  --   --   --   --   TROPONINI  --    < > <0.03 <0.03 <0.03   < > = values in this interval not displayed.   BNP: Invalid input(s): POCBNP Lab Results  Component Value Date   HGBA1C 5.6 08/18/2011   Lab Results  Component Value Date   TSH 0.553 02/06/2017   Lab Results  Component Value Date   SEGBTDVV61 607 06/28/2010   Lab Results  Component Value Date   FOLATE  06/28/2010    >20.0 (NOTE)  Reference Ranges        Deficient:       0.4 - 3.3 ng/mL        Indeterminate:   3.4 - 5.4 ng/mL        Normal:              > 5.4 ng/mL   Lab Results  Component Value Date   IRON 59 10/04/2012   TIBC 310 10/04/2012   FERRITIN 30 04/16/2014    Imaging and Procedures obtained prior to SNF admission: Dg Chest 2 View  Result Date: 04/07/2017 CLINICAL DATA:  Fever and cough EXAM: CHEST - 2 VIEW COMPARISON:  04/01/2017 CT chest 03/26/2017, radiographs 03/25/2017, 03/03/2017, 12/23/2016 FINDINGS: Post sternotomy changes. Left-sided pacing device as before. Borderline to mild cardiomegaly. Aortic atherosclerosis. Patchy infiltrate at the right base. No pneumothorax. Degenerative changes of the spine. IMPRESSION: Suspected patchy infiltrate at the right base, short interval radiographic follow-up suggested to ensure clearing. Cardiomegaly. Electronically Signed   By: Donavan Foil M.D.   On: 04/07/2017 03:33    Assessment/Plan   ICD-10-CM   1. Skin tear of left upper extremity S41.112A    forearm  2. Chronic respiratory failure with hypoxia (HCC) J96.11   3. Confusion state F44.89   4. Chronic systolic heart failure (HCC) I50.22    with reduced EF  5. AICD (automatic cardioverter/defibrillator) present Z95.810   6. S/P percutaneous endoscopic  gastrostomy (PEG) tube placement ([place]) Z93.1    due to dysphagia  7. Chronic atrial fibrillation (HCC) I48.2   8. Hypertensive heart disease with heart failure ([place]) I11.0   9. Gastroesophageal reflux disease without esophagitis K21.9   10. Thyroid nodule E04.1   11. Hyperbilirubinemia E80.6   12. Pneumonia of right lower lobe due to infectious organism ([place]) J18.1    completed IV abx; prob related to Influenza A   Wound care as ordered to left forearm  Peg tube care as indicated. TF as ordered  Cont current meds as ordered  PT/OT/ST as ordered  Palliative care consult pending  GOAL: short term rehab with potential for long term care. Communicated with pt and nursing.  Will follow  Labs/tests ordered: cbc w diff, cmp, TSH,  2 view CXR  Kenae Lindquist S. Perlie Gold  [facility] [address] [place], [place] [address] [phone] Cell (Monday-Friday 8 AM - 5 PM) [phone] After 5 PM and follow prompts

## 2017-05-02 ENCOUNTER — Encounter: Payer: Self-pay | Admitting: *Deleted

## 2017-05-02 ENCOUNTER — Encounter: Payer: Self-pay | Admitting: Adult Health

## 2017-05-02 ENCOUNTER — Non-Acute Institutional Stay (SKILLED_NURSING_FACILITY): Payer: Medicare Other | Admitting: Adult Health

## 2017-05-02 ENCOUNTER — Other Ambulatory Visit: Payer: Self-pay | Admitting: *Deleted

## 2017-05-02 DIAGNOSIS — I5022 Chronic systolic (congestive) heart failure: Secondary | ICD-10-CM | POA: Diagnosis not present

## 2017-05-02 DIAGNOSIS — J90 Pleural effusion, not elsewhere classified: Secondary | ICD-10-CM | POA: Diagnosis not present

## 2017-05-02 DIAGNOSIS — J449 Chronic obstructive pulmonary disease, unspecified: Secondary | ICD-10-CM

## 2017-05-02 DIAGNOSIS — J9601 Acute respiratory failure with hypoxia: Secondary | ICD-10-CM | POA: Diagnosis not present

## 2017-05-02 LAB — CBC AND DIFFERENTIAL
HCT: 34 — AB (ref 41–53)
Hemoglobin: 11.8 — AB (ref 13.5–17.5)
Neutrophils Absolute: 4
PLATELETS: 223 (ref 150–399)
WBC: 7.1

## 2017-05-02 LAB — HEPATIC FUNCTION PANEL
ALT: 8 — AB (ref 10–40)
AST: 17 (ref 14–40)
Alkaline Phosphatase: 132 — AB (ref 25–125)
Bilirubin, Total: 0.8

## 2017-05-02 LAB — BASIC METABOLIC PANEL
BUN: 22 — AB (ref 4–21)
CREATININE: 0.7 (ref 0.6–1.3)
GLUCOSE: 130
Potassium: 4.4 (ref 3.4–5.3)
Sodium: 143 (ref 137–147)

## 2017-05-02 LAB — TSH: TSH: 0.91 (ref 0.41–5.90)

## 2017-05-02 NOTE — Patient Outreach (Signed)
[facility]) Care Management  05/02/2017  [name] 09/07/1932 [id number]   CSW was able to make contact with patient today to perform the initial assessment, as well as assess and assist with social work needs and services.  CSW met with patient at [facility] where patient currently resides to receive short-term rehabilitative services.  CSW introduced self, explained role and types of services provided through [facility]).  CSW further explained to patient that CSW works with Theadore Nan, [place] colleague, also with [facility]. CSW then explained the reason for the visit, indicating that Mr. Forrest thought that patient would benefit from social work services and resources to assist with discharge planning needs and services from the skilled nursing facility.  CSW obtained two HIPAA compliant identifiers from patient, which included patient's name and date of birth. Patient reports doing well and working well with therapies (both physical and occupational).  Patient indicated that he will be returning home to live alone at time of discharge from [facility].  Patient states that he is able to perform all activities of daily living independently.  Patient is agreeable to home health services and durable medical equipment being arrange for him, if medically necessary.  CSW agreed to assist patient and the discharge planning coordinator at the facility with making appropriate discharge arrangements for patient.  CSW agreed to follow-up with patient again in one week.  Patient has been encouraged to contact CSW directly if a tentative discharge date is scheduled in the meantime. [name], BSW, MSW, LCSW  Licensed Education officer, [facility]  Mailing [place] [address]  [place], [place] [address] Physical Address-[address], [place],  [address] Toll Free Main # [phone] Fax # [phone] Cell # [phone]  Office # [phone] [email or web address]

## 2017-05-02 NOTE — Progress Notes (Signed)
Location:   [facility] Room Number: 121 A Place of Service:  SNF (31)   CODE STATUS: Full Code  Allergies  Allergen Reactions  . Tamsulosin Other (See Comments)    Dizziness, Made BP very low and weakness  . Celebrex [Celecoxib] Hives and Nausea And Vomiting    Gi upset  . Dronedarone Nausea And Vomiting and Other (See Comments)    GI upset, abdominal pain  . Esomeprazole Magnesium Hives and Other (See Comments)    "don't really remember"  . Digoxin Diarrhea    May have caused some diarrhea  . Hydrocodone-Acetaminophen Other (See Comments)    Bad headache  . Protonix [Pantoprazole Sodium] Nausea And Vomiting and Other (See Comments)    Tolerates Dexilant    Chief Complaint  Patient presents with  . Acute Visit    Follow up Chest Xray    HPI:  He had a follow up chest x-ray done yesterday demonstrated a mild right infiltrate and trace right pleural effusion. His 02 sats are worse. He has 02 sats in the low 70s to 80s with standing and activity. It is taking him about 5 minutes to recover with 02 at 2L. He continues to have wheezes and rales in right lobe. He has been started on 02 at 3L. He was not 02 dependent prior to his hospitalization.  He does have a family history of COPD. He denies any shortness of breath; no cough; no aspiration. There are no reports of fevers present.    Past Medical History:  Diagnosis Date  . Abnormal thyroid scan    Abnormal thyroid imaging studies from 11/09/2010, status post ultrasound guided fine needle aspiration of the dominant left inferior thyroid nodule on 12/15/2010. Cytology report showed rare follicular epithelial cells and hemosiderin laden macrophages.  . Adenomatous colon polyp   . AICD (automatic cardioverter/defibrillator) present    a. fx lead; a. s/p lead extraction 03/02/17  . Arthritis    "all over"  . Asthma   . Atrial fibrillation ([place])    on chronic Coumadin; stopped July 2013 due to subdural  hematomas  . CHF (congestive heart failure) (HCC)    EF 35-40% s/p most recent ICD generator change-out with Medtronic dual-chamber ICD 05/20/11 with explantation of previous abdominally-implanted device  . Coronary artery disease    s/p CABG 1983 and PCI/stent 2004.   . Diabetes mellitus    diet controlled  . Diverticulosis   . Dyslipidemia   . Enteritis   . Erythrocytosis   . GERD (gastroesophageal reflux disease)   . Hepatic steatosis   . Hypertension   . Ischemic cardiomyopathy    WITH CHF  . Monocytosis 04/17/2013  . Myocardial infarction ([place]) 1983; ~ 1990  . Pleural effusion    right  . Pneumonia August 2013  . Portal hypertensive gastropathy ([place])   . Rectal ulcer   . Renal calculi   . Subdural hematoma [facility]) July 2013   Anticoagulation stopped.   . SunDown syndrome   . VT (ventricular tachycardia) ([place])     Past Surgical History:  Procedure Laterality Date  . CHOLECYSTECTOMY    . COLONOSCOPY  07/07/2011   Procedure: COLONOSCOPY;  Surgeon: Jerene Bears, MD;  Location: [facility];  Service: Gastroenterology;  Laterality: N/A;  . CORONARY ANGIOPLASTY WITH STENT PLACEMENT  2004   Tandem Cypher stents LAD  . CORONARY ARTERY BYPASS GRAFT  1983   SVG-mLAD  . ESOPHAGOGASTRODUODENOSCOPY  02/11/2011   Procedure: ESOPHAGOGASTRODUODENOSCOPY (EGD);  Surgeon: Beryle Beams, MD;  Location: [facility];  Service: Endoscopy;  Laterality: N/A;  . FLEXIBLE SIGMOIDOSCOPY N/A 02/08/2017   Procedure: FLEXIBLE SIGMOIDOSCOPY;  Surgeon: Irene Shipper, MD;  Location: [facility];  Service: Endoscopy;  Laterality: N/A;  . ICD GENERATOR CHANGEOUT N/A 04/08/2016   Procedure: ICD Generator Changeout;  Surgeon: Evans Lance, MD;  Location: [place] [facility];  Service: Cardiovascular;  Laterality: N/A;  . ICD LEAD REMOVAL N/A 03/02/2017   Procedure: ICD LEAD REMOVAL;  Surgeon: Evans Lance, MD;  Location:  [place];  Service: Cardiovascular;  Laterality: N/A;  . IMPLANTABLE CARDIOVERTER  DEFIBRILLATOR (ICD) GENERATOR CHANGE N/A 05/20/2011   Procedure: ICD GENERATOR CHANGE;  Surgeon: Evans Lance, MD;  Medtronic secure dual-chamber ICD serial number [id number]   . IR GASTROSTOMY TUBE MOD SED  04/19/2017  . KNEE ARTHROSCOPY     right; "just went in and scraped it"  . LEAD INSERTION N/A 03/02/2017   Procedure: LEAD INSERTION;  Surgeon: Evans Lance, MD;  Location: [place];  Service: Cardiovascular;  Laterality: N/A;  . MASS EXCISION Right 05/10/2013   Procedure: EXCISION MASS RIGHT THUMB;  Surgeon: Wynonia Sours, MD;  Location: [place];  Service: Orthopedics;  Laterality: Right;  . PROXIMAL INTERPHALANGEAL FUSION (PIP) Right 05/10/2013   Procedure: DEBRIDEMENT PROXIMAL INTERPHALANGEAL FUSION (PIP);  Surgeon: Wynonia Sours, MD;  Location: [place];  Service: Orthopedics;  Laterality: Right;  . TONSILLECTOMY          Social History   Socioeconomic History  . Marital status: Divorced    Spouse name: Not on file  . Number of children: 2  . Years of education: Not on file  . Highest education level: Not on file  Occupational History  . Occupation: Sports coach: RETIRED  Social Needs  . Financial resource strain: Not on file  . Food insecurity:    Worry: Not on file    Inability: Not on file  . Transportation needs:    Medical: Not on file    Non-medical: Not on file  Tobacco Use  . Smoking status: Former Smoker    Packs/day: 2.00    Years: 30.00    Pack years: 60.00    Types: Cigarettes    Last attempt to quit: 06/23/1976    Years since quitting: 40.8  . Smokeless tobacco: Never Used  Substance and Sexual Activity  . Alcohol use: No  . Drug use: No  . Sexual activity: Never  Lifestyle  . Physical activity:    Days per week: Not on file    Minutes per session: Not on file  . Stress: Not on file  Relationships  . Social connections:    Talks on phone: Not on file    Gets together: Not on file    Attends religious  service: Not on file    Active member of club or organization: Not on file    Attends meetings of clubs or organizations: Not on file    Relationship status: Not on file  . Intimate partner violence:    Fear of current or ex partner: Not on file    Emotionally abused: Not on file    Physically abused: Not on file    Forced sexual activity: Not on file  Other Topics Concern  . Not on file  Social History Narrative  . Not on file   Family History  Problem Relation Age of Onset  .  Tuberculosis Mother   . Tuberculosis Father   . Heart disease Brother   . Diabetes Sister   . Diabetes Brother   . Clotting disorder Brother       VITAL SIGNS BP 112/68   Pulse 68   Temp 98 F (36.7 C)   Resp 16   Ht 6\' 1"  (1.854 m)   Wt 164 lb 1.6 oz (74.4 kg)   SpO2 99%   BMI 21.65 kg/m   Outpatient Encounter Medications as of 05/02/2017  Medication Sig  . acetaminophen (TYLENOL) 650 MG suppository Place 1 suppository (650 mg total) rectally every 6 (six) hours as needed for fever or mild pain (T over 101).  . ADVAIR DISKUS 250-50 MCG/DOSE AEPB Inhale 1 puff into the lungs 2 (two) times daily.   . digoxin (LANOXIN) 0.05 MG/ML solution Place 2.5 mLs (0.125 mg total) into feeding tube daily.  . fluticasone (FLONASE) 50 MCG/ACT nasal spray Place 1 spray into both nostrils daily as needed for allergies.  . furosemide (LASIX) 8 MG/ML solution Place 2.5 mLs (20 mg total) into feeding tube daily.  Marland Kitchen gabapentin (NEURONTIN) 100 MG capsule Give 1 Capsule (100mg ) to 3 capsules (300mg ) enterally at bedtime as needed  . ipratropium-albuterol (DUONEB) 0.5-2.5 (3) MG/3ML SOLN Take 3 mLs by nebulization every 4 (four) hours as needed.  . Lisinopril 1 MG/ML SOLN Place 2.5 mg into feeding tube daily.  Marland Kitchen loratadine (CLARITIN) 10 MG tablet Take 1 tablet (10 mg total) by mouth daily as needed for allergies.  Marland Kitchen menthol-cetylpyridinium (CEPACOL) 3 MG lozenge Take 1 lozenge by mouth every 2 (two) hours as needed for  sore throat.  . metoprolol tartrate (LOPRESSOR) 25 mg/10 mL SUSP Place 10 mLs (25 mg total) into feeding tube 2 (two) times daily.  . nitroGLYCERIN (NITROSTAT) 0.4 MG SL tablet Place 1 tablet (0.4 mg total) under the tongue every 5 (five) minutes as needed for chest pain.  . Nutritional Supplements (FEEDING SUPPLEMENT, JEVITY 1.5 CAL,) LIQD 90cc / hr on at 1800 x 16 hours at bedtime  . Nutritional Supplements (NUTRITIONAL SUPPLEMENT PO) NPO - nothing by mouth diet, Nothing by mouth textrue  . OLANZapine zydis (ZYPREXA) 5 MG disintegrating tablet Take 1 tablet (5 mg total) by mouth at bedtime.  . pantoprazole sodium (PROTONIX) 40 mg/20 mL PACK Place 20 mLs (40 mg total) into feeding tube daily.  . potassium chloride 20 MEQ/15ML (10%) SOLN Place 7.5 mLs (10 mEq total) into feeding tube daily.  . RESTASIS 0.05 % ophthalmic emulsion INSTILL 1 DROP INTO BOTH EYES TWICE A DAY  . sennosides (SENOKOT) 8.8 MG/5ML syrup Place 5 mLs into feeding tube 2 (two) times daily.  . traMADol (ULTRAM) 5 mg/mL SUSP Place 10-20 mLs (50-100 mg total) into feeding tube every 6 (six) hours as needed for moderate pain.  . Water For Irrigation, Sterile (FREE WATER) SOLN Place into feeding tube every 8 (eight) hours. 125 ml H2o flushes via tube every 4 hours  . [DISCONTINUED] menthol-cetylpyridinium (CEPACOL) 3 MG lozenge Take 1 lozenge (3 mg total) by mouth as needed for sore throat. (Patient not taking: Reported on 05/02/2017)  . [DISCONTINUED] phenol (CHLORASEPTIC) 1.4 % LIQD Use as directed 1 spray in the mouth or throat as needed for throat irritation / pain. (Patient not taking: Reported on 05/02/2017)   No facility-administered encounter medications on file as of 05/02/2017.      SIGNIFICANT DIAGNOSTIC EXAMS  PREVIOUS:   04-07-17: chest x-ray: Suspected patchy infiltrate at the right base,  short interval radiographic follow-up suggested to ensure clearing. Cardiomegaly.  04-09-17: chest x-ray: COPD, cardiomegaly,  stable. Increasing right basilar airspace opacity and small right effusion concerning for pneumonia  04-10-17: swallow study: Recommend NPO with short term alternative nutrition and continued ST   04-19-17: PEG tube insertion   TODAY:   05-01-17: chest x-ray: mild right basilar infiltrate with trace right sided effusion     LABS REVIEWED: PREVIOUS:   04-07-17: wbc 9.5; hgb 13.4; hct 41.9; mcv 94.2; plt 101; glucose 84; bun 18; creat 1.28 ;k+ 4.0; na++ 136; ca 8.2; total bili 2.5; albumin 3.2; rapid flu: +A; blood culture: no growth; urine culture: <10,000 colonies 04-09-17: wbc 9.0; hgb 11.5; hct 35.1; mcv 91.9; plt 120; glucose 88; bun 9; creat 0.86; k+ 3.5; na++ 133; ca 7.5; mag 1.7 04-13-17: wbc 9.4; hgb 13.8; hct 42.6; mcv 94.0; plt 217; glucose 115; bun 11; creat 1.04 ;k+ 3.3; na++144; ca 8.7 04-16-17: wbc 6.5; hgb 12.2; hct 38.5; mcv 95.3; plt 185; glucose 115; bun 11; creat 0.78; k+ 4.0; na++ 143; ca 8.9; mag 1.7  04-20-17:wbc 12.4; hgb 12.2; hct 38.0; mcv 93.6; plt 209; glucose 110; bun 18; creat 0.95; k+ 4.7; na++ 136; ca 8.4; mag 2.7;  04-22-17: mag 1.9   NO NEW LABS.     Review of Systems  Constitutional: Negative for malaise/fatigue.  Respiratory: Negative for cough and shortness of breath.   Cardiovascular: Negative for chest pain, palpitations and leg swelling.  Gastrointestinal: Negative for abdominal pain, constipation and heartburn.  Musculoskeletal: Negative for back pain, joint pain and myalgias.  Skin: Negative.   Neurological: Negative for dizziness.  Psychiatric/Behavioral: The patient is not nervous/anxious.     Physical Exam  Constitutional: He appears well-developed and well-nourished. No distress.  Neck: No thyromegaly present.  Cardiovascular: Normal rate and intact distal pulses.  Murmur heard. Heart rate irregular   Pulmonary/Chest: Effort normal. No respiratory distress.  02 at 3L/[place] Has wheezes/rales  right bases   Abdominal: Soft. Bowel sounds are  normal. He exhibits no distension. There is no tenderness.  Peg tube present without signs of infection present   Musculoskeletal: He exhibits no edema.  Is able to move all extremities   Lymphadenopathy:    He has no cervical adenopathy.  Neurological: He is alert.  Skin: Skin is warm and dry. He is not diaphoretic.  Bilateral lower extremities discolored   Psychiatric: He has a normal mood and affect.    ASSESSMENT/ PLAN:  TODAY;   1. Chronic systolic heart failure: EF 35-40%: is status post AICD placement: 2. Acute respiratory failure with hypoxia: 3. COPD 4. Right pleural effusion:   His status is worse:   Will begin 02 at 3L/[place] Will change duoneb to every 6 hours routinely for 2 weeks then return to every 4 hours as needed  Will begin spiriva 18 mcg daily  Will give him an extra lasix 20 mg today  Will setup a pulmonary consult for patient with copd; now 02 dependent; with recent pneumonia; influenza; pleural effusion Will monitor his status.     MD is aware of resident's narcotic use and is in agreement with current plan of care. We will attempt to wean resident as apropriate    Ok Edwards NP [facility]  Contact [phone] Monday through Friday 8am- 5pm  After hours call [phone]

## 2017-05-03 ENCOUNTER — Non-Acute Institutional Stay (SKILLED_NURSING_FACILITY): Payer: Medicare Other | Admitting: Adult Health

## 2017-05-03 ENCOUNTER — Encounter: Payer: Self-pay | Admitting: Adult Health

## 2017-05-03 DIAGNOSIS — I25119 Atherosclerotic heart disease of native coronary artery with unspecified angina pectoris: Secondary | ICD-10-CM

## 2017-05-03 DIAGNOSIS — I110 Hypertensive heart disease with heart failure: Secondary | ICD-10-CM | POA: Diagnosis not present

## 2017-05-03 DIAGNOSIS — I5022 Chronic systolic (congestive) heart failure: Secondary | ICD-10-CM | POA: Diagnosis not present

## 2017-05-03 DIAGNOSIS — I4891 Unspecified atrial fibrillation: Secondary | ICD-10-CM | POA: Diagnosis not present

## 2017-05-03 NOTE — Progress Notes (Addendum)
Location:   [facility] Room Number: 121 A Place of Service:  SNF (31)   CODE STATUS: Full Code  Allergies  Allergen Reactions  . Tamsulosin Other (See Comments)    Dizziness, Made BP very low and weakness  . Celebrex [Celecoxib] Hives and Nausea And Vomiting    Gi upset  . Dronedarone Nausea And Vomiting and Other (See Comments)    GI upset, abdominal pain  . Esomeprazole Magnesium Hives and Other (See Comments)    "don't really remember"  . Digoxin Diarrhea    May have caused some diarrhea  . Hydrocodone-Acetaminophen Other (See Comments)    Bad headache  . Protonix [Pantoprazole Sodium] Nausea And Vomiting and Other (See Comments)    Tolerates Dexilant    Chief Complaint  Patient presents with  . Medical Management of Chronic Issues     afib; heart failure; cad; hypertension: weekly follow up     HPI:  He is a short term rehab patient of this facility being seen for the management of his chronic illnesses: afib; heart failure; cad; hypertension.  Therapy reports that he is having decreased 02 sats with activity. The readings have been as low as the 70s he is using 02 at 3L; it can take as long as 5 minutes for him to recover. Therapy also reports that he is having some blood tinged sputum; that he has dried sputum present in his mouth with overgrowth present.  He denies any chest pain; does have shortness of breath present; no complaints of pain present.   He doe shave a history of PE in Feb 2019.  There are no reports of fevers present.   Past Medical History:  Diagnosis Date  . Abnormal thyroid scan    Abnormal thyroid imaging studies from 11/09/2010, status post ultrasound guided fine needle aspiration of the dominant left inferior thyroid nodule on 12/15/2010. Cytology report showed rare follicular epithelial cells and hemosiderin laden macrophages.  . Adenomatous colon polyp   . AICD (automatic cardioverter/defibrillator) present    a. fx lead; a.  s/p lead extraction 03/02/17  . Arthritis    "all over"  . Asthma   . Atrial fibrillation ([place])    on chronic Coumadin; stopped July 2013 due to subdural hematomas  . CHF (congestive heart failure) (HCC)    EF 35-40% s/p most recent ICD generator change-out with Medtronic dual-chamber ICD 05/20/11 with explantation of previous abdominally-implanted device  . Coronary artery disease    s/p CABG 1983 and PCI/stent 2004.   . Diabetes mellitus    diet controlled  . Diverticulosis   . Dyslipidemia   . Enteritis   . Erythrocytosis   . GERD (gastroesophageal reflux disease)   . Hepatic steatosis   . Hypertension   . Ischemic cardiomyopathy    WITH CHF  . Monocytosis 04/17/2013  . Myocardial infarction ([place]) 1983; ~ 1990  . Pleural effusion    right  . Pneumonia August 2013  . Portal hypertensive gastropathy ([place])   . Rectal ulcer   . Renal calculi   . Subdural hematoma [facility]) July 2013   Anticoagulation stopped.   . SunDown syndrome   . VT (ventricular tachycardia) ([place])     Past Surgical History:  Procedure Laterality Date  . CHOLECYSTECTOMY    . COLONOSCOPY  07/07/2011   Procedure: COLONOSCOPY;  Surgeon: Jerene Bears, MD;  Location: [facility] ENDOSCOPY;  Service: Gastroenterology;  Laterality: N/A;  . CORONARY ANGIOPLASTY WITH STENT PLACEMENT  2004  Tandem Cypher stents LAD  . CORONARY ARTERY BYPASS GRAFT  1983   SVG-mLAD  . ESOPHAGOGASTRODUODENOSCOPY  02/11/2011   Procedure: ESOPHAGOGASTRODUODENOSCOPY (EGD);  Surgeon: Beryle Beams, MD;  Location: [facility];  Service: Endoscopy;  Laterality: N/A;  . FLEXIBLE SIGMOIDOSCOPY N/A 02/08/2017   Procedure: FLEXIBLE SIGMOIDOSCOPY;  Surgeon: Irene Shipper, MD;  Location: [facility];  Service: Endoscopy;  Laterality: N/A;  . ICD GENERATOR CHANGEOUT N/A 04/08/2016   Procedure: ICD Generator Changeout;  Surgeon: Evans Lance, MD;  Location: [place] CV LAB;  Service: Cardiovascular;  Laterality: N/A;  . ICD LEAD REMOVAL N/A 03/02/2017    Procedure: ICD LEAD REMOVAL;  Surgeon: Evans Lance, MD;  Location: [place];  Service: Cardiovascular;  Laterality: N/A;  . IMPLANTABLE CARDIOVERTER DEFIBRILLATOR (ICD) GENERATOR CHANGE N/A 05/20/2011   Procedure: ICD GENERATOR CHANGE;  Surgeon: Evans Lance, MD;  Medtronic secure dual-chamber ICD serial number [id number]   . IR GASTROSTOMY TUBE MOD SED  04/19/2017  . KNEE ARTHROSCOPY     right; "just went in and scraped it"  . LEAD INSERTION N/A 03/02/2017   Procedure: LEAD INSERTION;  Surgeon: Evans Lance, MD;  Location: [place];  Service: Cardiovascular;  Laterality: N/A;  . MASS EXCISION Right 05/10/2013   Procedure: EXCISION MASS RIGHT THUMB;  Surgeon: Wynonia Sours, MD;  Location: [place];  Service: Orthopedics;  Laterality: Right;  . PROXIMAL INTERPHALANGEAL FUSION (PIP) Right 05/10/2013   Procedure: DEBRIDEMENT PROXIMAL INTERPHALANGEAL FUSION (PIP);  Surgeon: Wynonia Sours, MD;  Location: [place];  Service: Orthopedics;  Laterality: Right;  . TONSILLECTOMY          Social History   Socioeconomic History  . Marital status: Divorced    Spouse name: Not on file  . Number of children: 2  . Years of education: Not on file  . Highest education level: Not on file  Occupational History  . Occupation: Sports coach: RETIRED  Social Needs  . Financial resource strain: Not on file  . Food insecurity:    Worry: Not on file    Inability: Not on file  . Transportation needs:    Medical: Not on file    Non-medical: Not on file  Tobacco Use  . Smoking status: Former Smoker    Packs/day: 2.00    Years: 30.00    Pack years: 60.00    Types: Cigarettes    Last attempt to quit: 06/23/1976    Years since quitting: 40.8  . Smokeless tobacco: Never Used  Substance and Sexual Activity  . Alcohol use: No  . Drug use: No  . Sexual activity: Never  Lifestyle  . Physical activity:    Days per week: Not on file    Minutes per session: Not on  file  . Stress: Not on file  Relationships  . Social connections:    Talks on phone: Not on file    Gets together: Not on file    Attends religious service: Not on file    Active member of club or organization: Not on file    Attends meetings of clubs or organizations: Not on file    Relationship status: Not on file  . Intimate partner violence:    Fear of current or ex partner: Not on file    Emotionally abused: Not on file    Physically abused: Not on file    Forced sexual activity: Not on file  Other Topics  Concern  . Not on file  Social History Narrative  . Not on file   Family History  Problem Relation Age of Onset  . Tuberculosis Mother   . Tuberculosis Father   . Heart disease Brother   . Diabetes Sister   . Diabetes Brother   . Clotting disorder Brother       VITAL SIGNS BP 110/64   Pulse (!) 106   Temp (!) 97 F (36.1 C)   Resp 18   Ht _0  (1.854 m)   Wt 164 lb 1.6 oz (74.4 kg)   SpO2 (!) 87% Comment: 3 lt/min  BMI 21.65 kg/m   Outpatient Encounter Medications as of 05/03/2017  Medication Sig  . acetaminophen (TYLENOL) 650 MG suppository Place 1 suppository (650 mg total) rectally every 6 (six) hours as needed for fever or mild pain (T over 101).  . ADVAIR DISKUS 250-50 MCG/DOSE AEPB Inhale 1 puff into the lungs 2 (two) times daily.   . digoxin (LANOXIN) 0.05 MG/ML solution Place 2.5 mLs (0.125 mg total) into feeding tube daily.  . fluticasone (FLONASE) 50 MCG/ACT nasal spray Place 1 spray into both nostrils daily as needed for allergies.  . furosemide (LASIX) 8 MG/ML solution Place 2.5 mLs (20 mg total) into feeding tube daily.  Marland Kitchen gabapentin (NEURONTIN) 100 MG capsule Give 1 Capsule (120m) to 3 capsules (3043m enterally at bedtime as needed  . ipratropium-albuterol (DUONEB) 0.5-2.5 (3) MG/3ML SOLN Take 3 mLs by nebulization every 4 (four) hours as needed.  . Marland Kitchenpratropium-albuterol (DUONEB) 0.5-2.5 (3) MG/3ML SOLN Take 3 mLs by nebulization every 6 (six)  hours. X 2 weeks  . Lisinopril 1 MG/ML SOLN Place 2.5 mg into feeding tube daily.  . Marland Kitchenoratadine (CLARITIN) 10 MG tablet Take 1 tablet (10 mg total) by mouth daily as needed for allergies.  . Marland Kitchenenthol-cetylpyridinium (CEPACOL) 3 MG lozenge Take 1 lozenge by mouth every 2 (two) hours as needed for sore throat.  . metoprolol tartrate (LOPRESSOR) 25 mg/10 mL SUSP Place 10 mLs (25 mg total) into feeding tube 2 (two) times daily.  . nitroGLYCERIN (NITROSTAT) 0.4 MG SL tablet Place 1 tablet (0.4 mg total) under the tongue every 5 (five) minutes as needed for chest pain.  . Nutritional Supplements (FEEDING SUPPLEMENT, JEVITY 1.5 CAL,) LIQD 90cc / hr on at 1800 x 16 hours at bedtime  . Nutritional Supplements (NUTRITIONAL SUPPLEMENT PO) NPO - nothing by mouth diet, Nothing by mouth textrue  . OLANZapine zydis (ZYPREXA) 5 MG disintegrating tablet Take 1 tablet (5 mg total) by mouth at bedtime.  . pantoprazole sodium (PROTONIX) 40 mg/20 mL PACK Place 20 mLs (40 mg total) into feeding tube daily.  . potassium chloride 20 MEQ/15ML (10%) SOLN Place 7.5 mLs (10 mEq total) into feeding tube daily.  . RESTASIS 0.05 % ophthalmic emulsion INSTILL 1 DROP INTO BOTH EYES TWICE A DAY  . sennosides (SENOKOT) 8.8 MG/5ML syrup Place 5 mLs into feeding tube 2 (two) times daily.  . Marland Kitcheniotropium (SPIRIVA) 18 MCG inhalation capsule Place 18 mcg into inhaler and inhale daily.  . traMADol (ULTRAM) 5 mg/mL SUSP Place 10-20 mLs (50-100 mg total) into feeding tube every 6 (six) hours as needed for moderate pain.  . Water For Irrigation, Sterile (FREE WATER) SOLN Place into feeding tube every 8 (eight) hours. 125 ml H2o flushes via tube every 4 hours   No facility-administered encounter medications on file as of 05/03/2017.      SIGNIFICANT DIAGNOSTIC EXAMS  PREVIOUS:  03-26-17: ct of chest abdomen and pelvis: 1. No acute/traumatic intrathoracic, abdominal, or pelvic pathology. 2. Nonocclusive pulmonary embolus in the subsegmental  right lower lobe pulmonary artery branches likely old PE or scarring. Nonocclusive acute PE is not entirely excluded. Clinical correlation is recommended. 3. Mild cardiomegaly with dilatation of the right atrium. 4. 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 Small right pleural effusion and stable right lung base round atelectasis/scarring. 5. Aortic Atherosclerosis  and Emphysema    04-07-17: chest x-ray: Suspected patchy infiltrate at the right base, short interval radiographic follow-up suggested to ensure clearing. Cardiomegaly.  04-09-17: chest x-ray: COPD, cardiomegaly, stable. Increasing right basilar airspace opacity and small right effusion concerning for pneumonia  04-10-17: swallow study: Recommend NPO with short term alternative nutrition and continued ST   04-19-17: PEG tube insertion   05-01-17: chest x-ray: mild right basilar infiltrate with trace right sided effusion  NO NEW EXAMS      LABS REVIEWED: PREVIOUS:   04-07-17: wbc 9.5; hgb 13.4; hct 41.9; mcv 94.2; plt 101; glucose 84; bun 18; creat 1.28 ;k+ 4.0; na++ 136; ca 8.2; total bili 2.5; albumin 3.2; rapid flu: +A; blood culture: no growth; urine culture: <10,000 colonies 04-09-17: wbc 9.0; hgb 11.5; hct 35.1; mcv 91.9; plt 120; glucose 88; bun 9; creat 0.86; k+ 3.5; na++ 133; ca 7.5; mag 1.7 04-13-17: wbc 9.4; hgb 13.8; hct 42.6; mcv 94.0; plt 217; glucose 115; bun 11; creat 1.04 ;k+ 3.3; na++144; ca 8.7 04-16-17: wbc 6.5; hgb 12.2; hct 38.5; mcv 95.3; plt 185; glucose 115; bun 11; creat 0.78; k+ 4.0; na++ 143; ca 8.9; mag 1.7  04-20-17:wbc 12.4; hgb 12.2; hct 38.0; mcv 93.6; plt 209; glucose 110; bun 18; creat 0.95; k+ 4.7; na++ 136; ca 8.4; mag 2.7;  04-22-17: mag 1.9   TODAY:   05-02-17: wbc 7.1; hgb 11.8; hct 34.0; mcv 87.6; plt 223; glucose 130; bun 22.2; creat 0.71; k+ 4.4; na++ 143; ca 9.1; alk phos 123; albumin 3.4; tsh 0.91     Review of Systems  Reason unable to perform ROS: poor historian     Constitutional: Negative for malaise/fatigue.  Respiratory: Positive for shortness of breath. Negative for cough.   Cardiovascular: Negative for chest pain.  Gastrointestinal: Negative for abdominal pain.  Musculoskeletal: Negative for back pain.  Skin: Negative.   Psychiatric/Behavioral: The patient is not nervous/anxious.     Physical Exam  Constitutional: He appears well-developed and well-nourished. No distress.  Neck: No thyromegaly present.  Cardiovascular: Normal rate and intact distal pulses.  Murmur heard. 1/6 Heart rate irregular   Pulmonary/Chest: Effort normal. No respiratory distress.  Diminished breath sounds   Abdominal: Soft. Bowel sounds are normal. He exhibits no distension. There is no tenderness.  Peg tube present without signs of infection present   Musculoskeletal: He exhibits no edema.  Is able to move all extremities   Lymphadenopathy:    He has no cervical adenopathy.  Neurological: He is alert.  Skin: Skin is warm and dry. He is not diaphoretic.  Psychiatric: He has a normal mood and affect.    ASSESSMENT/ PLAN:  TODAY;   1. Chronic systolic heart failure: EF 35-40%: is status post AICD placement:  is stable will continue lasix 20 mg daily with k+ 10 meq daily  lopressor 25 mg twice daily and will monitor his status.   2. Atrial fibrillation with controlled ventricular response: has history of PE  heart rate stable: is not a candidate for anticoagulations  due to history of subdural hematoma: will continue lopressor 25 mg twice daily and digoxin 0.125 mg daily   3. Coronary artery disease involving native coronary artery with angina pectoris: is status post CABG/ stent placements: is stable will continue lopressor 25 mg twice daily and has prn ntg.   4. Hypertensive heart disease with heart failure: stable b/p 110/64: will continue lopressor 25 mg twice daily and lisinopril 2.5 mg daily  PREVIOUS    5. COPD( emphysema): is worse: is now on 02 at  3L/[place] to keep sats >91 %. Will continue advair 250/50 twice daily  spiriva 18 mcg daily duoneb every 6 hours for 2 weeks then every 4 hours as needed   6. Chronic allergic rhinitis: stable will continue claritin daily as needed and flonase daily as needed  7. Polyneuropathy : is stable has neurontin 300 mg nightly as needed  8. Gastroesophageal reflux disease without esophagitis: stable will continue protonix 40 mg daily    9. Chronic constipation: stable will continue senna twice daily   10.  Pharyngoesophageal dysphagia: has chronic aspiration: is currently NPO and is receiving jevity tube feeding. For his dry mouth will begin biotene every 4 hours while awake and will monitor his status.   11. CKD stage III: stable bun 22.2/creat 0.71  12. Acute confusion: due to acute metabolic encephalopathy is stable will continue zyprexa 5 mg nightly; will need to monitor his status; and should be able to wean this off slowly  13.  Adult failure to thrive: weight is 164 pounds; albumin is 3.4; without change in status; will continue to monitor  14. History of right lung PE; he is not a candidate for anticoagulation therapy due to history of falls and subdural hematoma.     Will get a chest x-ray in the AM Will palliative care see him.    MD is aware of resident's narcotic use and is in agreement with current plan of care. We will attempt to wean resident as apropriate   Ok Edwards NP [facility]  Contact [phone] Monday through Friday 8am- 5pm  After hours call [phone]

## 2017-05-05 DIAGNOSIS — R531 Weakness: Secondary | ICD-10-CM | POA: Diagnosis not present

## 2017-05-05 DIAGNOSIS — R0602 Shortness of breath: Secondary | ICD-10-CM | POA: Diagnosis not present

## 2017-05-05 DIAGNOSIS — R1310 Dysphagia, unspecified: Secondary | ICD-10-CM | POA: Diagnosis not present

## 2017-05-08 DIAGNOSIS — S069X9D Unspecified intracranial injury with loss of consciousness of unspecified duration, subsequent encounter: Secondary | ICD-10-CM

## 2017-05-08 DIAGNOSIS — I482 Chronic atrial fibrillation: Secondary | ICD-10-CM | POA: Diagnosis not present

## 2017-05-08 DIAGNOSIS — D509 Iron deficiency anemia, unspecified: Secondary | ICD-10-CM | POA: Diagnosis not present

## 2017-05-08 DIAGNOSIS — W19XXXA Unspecified fall, initial encounter: Secondary | ICD-10-CM

## 2017-05-08 DIAGNOSIS — E119 Type 2 diabetes mellitus without complications: Secondary | ICD-10-CM | POA: Diagnosis not present

## 2017-05-08 DIAGNOSIS — Z955 Presence of coronary angioplasty implant and graft: Secondary | ICD-10-CM

## 2017-05-08 DIAGNOSIS — N179 Acute kidney failure, unspecified: Secondary | ICD-10-CM | POA: Diagnosis not present

## 2017-05-08 DIAGNOSIS — D696 Thrombocytopenia, unspecified: Secondary | ICD-10-CM

## 2017-05-08 DIAGNOSIS — Z951 Presence of aortocoronary bypass graft: Secondary | ICD-10-CM

## 2017-05-08 DIAGNOSIS — M150 Primary generalized (osteo)arthritis: Secondary | ICD-10-CM | POA: Diagnosis not present

## 2017-05-08 DIAGNOSIS — E041 Nontoxic single thyroid nodule: Secondary | ICD-10-CM

## 2017-05-08 DIAGNOSIS — Z9181 History of falling: Secondary | ICD-10-CM

## 2017-05-08 DIAGNOSIS — I2782 Chronic pulmonary embolism: Secondary | ICD-10-CM

## 2017-05-08 DIAGNOSIS — J45909 Unspecified asthma, uncomplicated: Secondary | ICD-10-CM | POA: Diagnosis not present

## 2017-05-08 DIAGNOSIS — R55 Syncope and collapse: Secondary | ICD-10-CM

## 2017-05-08 DIAGNOSIS — Z95810 Presence of automatic (implantable) cardiac defibrillator: Secondary | ICD-10-CM

## 2017-05-08 DIAGNOSIS — I5022 Chronic systolic (congestive) heart failure: Secondary | ICD-10-CM | POA: Diagnosis not present

## 2017-05-08 DIAGNOSIS — K219 Gastro-esophageal reflux disease without esophagitis: Secondary | ICD-10-CM | POA: Diagnosis not present

## 2017-05-08 DIAGNOSIS — I110 Hypertensive heart disease with heart failure: Secondary | ICD-10-CM | POA: Diagnosis not present

## 2017-05-08 DIAGNOSIS — E785 Hyperlipidemia, unspecified: Secondary | ICD-10-CM

## 2017-05-08 DIAGNOSIS — I2510 Atherosclerotic heart disease of native coronary artery without angina pectoris: Secondary | ICD-10-CM | POA: Diagnosis not present

## 2017-05-09 ENCOUNTER — Other Ambulatory Visit: Payer: Self-pay | Admitting: *Deleted

## 2017-05-09 NOTE — Patient Outreach (Signed)
[facility]) Care Management  05/09/2017  [name] 21-May-1932 [id number]  CSW was able to meet with patient today at [facility] [place] [place] where patient currently resides to receive short-term rehabilitative services, to perform a routine visit.  Patient was lying in his bed watching television at the time of CSW's arrival.  CSW noted that patient is still receiving oxygen and patient reports that he continues to receive bolus tube feedings.  Patient admits that he is working well with therapies, both physical and occupational, but in talking with patient's physical therapist, the only thing that he could conclude was that patient is "compliant".  Patient's physical therapist also indicated that patient is still not able to ambulate without maximum assistance.  Patient endorses that he will be returning home to live alone at time of discharge, but patient's physical therapist reports that patient has a "ways to go" before he will be able to perform all activities of daily living independently.  No tentative discharge date has been scheduled at this time.  CSW agreed to follow-up with patient again next week to assess and assist with discharge planning needs and services. [name], BSW, MSW, LCSW  Licensed Education officer, [facility]  Mailing [place] [address] [place], [place] [address] Physical Address-[address], [place], [place] [address] Toll Free Main # [phone] Fax # [phone] Cell # [phone]  Office # [phone] [email or web address]

## 2017-05-11 ENCOUNTER — Encounter: Payer: Self-pay | Admitting: Adult Health

## 2017-05-11 ENCOUNTER — Non-Acute Institutional Stay (SKILLED_NURSING_FACILITY): Payer: Medicare Other | Admitting: Adult Health

## 2017-05-11 DIAGNOSIS — K219 Gastro-esophageal reflux disease without esophagitis: Secondary | ICD-10-CM

## 2017-05-11 DIAGNOSIS — G629 Polyneuropathy, unspecified: Secondary | ICD-10-CM | POA: Diagnosis not present

## 2017-05-11 DIAGNOSIS — K5909 Other constipation: Secondary | ICD-10-CM

## 2017-05-11 DIAGNOSIS — J449 Chronic obstructive pulmonary disease, unspecified: Secondary | ICD-10-CM | POA: Diagnosis not present

## 2017-05-11 DIAGNOSIS — J309 Allergic rhinitis, unspecified: Secondary | ICD-10-CM

## 2017-05-11 NOTE — Progress Notes (Signed)
Location:   [facility] Room Number: 121 A Place of Service:  SNF (31)   CODE STATUS: Full Code  Allergies  Allergen Reactions  . Tamsulosin Other (See Comments)    Dizziness, Made BP very low and weakness  . Celebrex [Celecoxib] Hives and Nausea And Vomiting    Gi upset  . Dronedarone Nausea And Vomiting and Other (See Comments)    GI upset, abdominal pain  . Esomeprazole Magnesium Hives and Other (See Comments)    "don't really remember"  . Digoxin Diarrhea    May have caused some diarrhea  . Hydrocodone-Acetaminophen Other (See Comments)    Bad headache  . Protonix [Pantoprazole Sodium] Nausea And Vomiting and Other (See Comments)    Tolerates Dexilant    Chief Complaint  Patient presents with  . Medical Management of Chronic Issues    Copd; constipation; gerd; allergic rhinitis; polyneuropathy; weekly follow for first 30 days post hospitalization     HPI:  He is a rehab patient of this facility being seen for the management of his chronic illnesses: copd; constipation; gerd; allergic rhinitis; polyneuropathy. He is unable to fully participate in the phi or ros; but did complain of constipation; there are no reports of aspiration present; no reports of fevers present. He continues to participate in therapy. There are no nursing concerns at this time.   Past Medical History:  Diagnosis Date  . Abnormal thyroid scan    Abnormal thyroid imaging studies from 11/09/2010, status post ultrasound guided fine needle aspiration of the dominant left inferior thyroid nodule on 12/15/2010. Cytology report showed rare follicular epithelial cells and hemosiderin laden macrophages.  . Adenomatous colon polyp   . AICD (automatic cardioverter/defibrillator) present    a. fx lead; a. s/p lead extraction 03/02/17  . Arthritis    "all over"  . Asthma   . Atrial fibrillation ([place])    on chronic Coumadin; stopped July 2013 due to subdural hematomas  . CHF (congestive  heart failure) (HCC)    EF 35-40% s/p most recent ICD generator change-out with Medtronic dual-chamber ICD 05/20/11 with explantation of previous abdominally-implanted device  . Coronary artery disease    s/p CABG 1983 and PCI/stent 2004.   . Diabetes mellitus    diet controlled  . Diverticulosis   . Dyslipidemia   . Enteritis   . Erythrocytosis   . GERD (gastroesophageal reflux disease)   . Hepatic steatosis   . Hypertension   . Ischemic cardiomyopathy    WITH CHF  . Monocytosis 04/17/2013  . Myocardial infarction ([place]) 1983; ~ 1990  . Pleural effusion    right  . Pneumonia August 2013  . Portal hypertensive gastropathy ([place])   . Rectal ulcer   . Renal calculi   . Subdural hematoma [facility]) July 2013   Anticoagulation stopped.   . SunDown syndrome   . VT (ventricular tachycardia) ([place])     Past Surgical History:  Procedure Laterality Date  . CHOLECYSTECTOMY    . COLONOSCOPY  07/07/2011   Procedure: COLONOSCOPY;  Surgeon: Jerene Bears, MD;  Location: [facility];  Service: Gastroenterology;  Laterality: N/A;  . CORONARY ANGIOPLASTY WITH STENT PLACEMENT  2004   Tandem Cypher stents LAD  . CORONARY ARTERY BYPASS GRAFT  1983   SVG-mLAD  . ESOPHAGOGASTRODUODENOSCOPY  02/11/2011   Procedure: ESOPHAGOGASTRODUODENOSCOPY (EGD);  Surgeon: Beryle Beams, MD;  Location: [facility];  Service: Endoscopy;  Laterality: N/A;  . FLEXIBLE SIGMOIDOSCOPY N/A 02/08/2017   Procedure: FLEXIBLE SIGMOIDOSCOPY;  Surgeon: Irene Shipper, MD;  Location: [facility];  Service: Endoscopy;  Laterality: N/A;  . ICD GENERATOR CHANGEOUT N/A 04/08/2016   Procedure: ICD Generator Changeout;  Surgeon: Evans Lance, MD;  Location: [place] [facility];  Service: Cardiovascular;  Laterality: N/A;  . ICD LEAD REMOVAL N/A 03/02/2017   Procedure: ICD LEAD REMOVAL;  Surgeon: Evans Lance, MD;  Location: [place];  Service: Cardiovascular;  Laterality: N/A;  . IMPLANTABLE CARDIOVERTER DEFIBRILLATOR (ICD) GENERATOR CHANGE  N/A 05/20/2011   Procedure: ICD GENERATOR CHANGE;  Surgeon: Evans Lance, MD;  Medtronic secure dual-chamber ICD serial number [id number]   . IR GASTROSTOMY TUBE MOD SED  04/19/2017  . KNEE ARTHROSCOPY     right; "just went in and scraped it"  . LEAD INSERTION N/A 03/02/2017   Procedure: LEAD INSERTION;  Surgeon: Evans Lance, MD;  Location: [place];  Service: Cardiovascular;  Laterality: N/A;  . MASS EXCISION Right 05/10/2013   Procedure: EXCISION MASS RIGHT THUMB;  Surgeon: Wynonia Sours, MD;  Location: [place];  Service: Orthopedics;  Laterality: Right;  . PROXIMAL INTERPHALANGEAL FUSION (PIP) Right 05/10/2013   Procedure: DEBRIDEMENT PROXIMAL INTERPHALANGEAL FUSION (PIP);  Surgeon: Wynonia Sours, MD;  Location: [place];  Service: Orthopedics;  Laterality: Right;  . TONSILLECTOMY          Social History   Socioeconomic History  . Marital status: Divorced    Spouse name: Not on file  . Number of children: 2  . Years of education: Not on file  . Highest education level: Not on file  Occupational History  . Occupation: Sports coach: RETIRED  Social Needs  . Financial resource strain: Not on file  . Food insecurity:    Worry: Not on file    Inability: Not on file  . Transportation needs:    Medical: Not on file    Non-medical: Not on file  Tobacco Use  . Smoking status: Former Smoker    Packs/day: 2.00    Years: 30.00    Pack years: 60.00    Types: Cigarettes    Last attempt to quit: 06/23/1976    Years since quitting: 40.9  . Smokeless tobacco: Never Used  Substance and Sexual Activity  . Alcohol use: No  . Drug use: No  . Sexual activity: Never  Lifestyle  . Physical activity:    Days per week: Not on file    Minutes per session: Not on file  . Stress: Not on file  Relationships  . Social connections:    Talks on phone: Not on file    Gets together: Not on file    Attends religious service: Not on file    Active  member of club or organization: Not on file    Attends meetings of clubs or organizations: Not on file    Relationship status: Not on file  . Intimate partner violence:    Fear of current or ex partner: Not on file    Emotionally abused: Not on file    Physically abused: Not on file    Forced sexual activity: Not on file  Other Topics Concern  . Not on file  Social History Narrative  . Not on file   Family History  Problem Relation Age of Onset  . Tuberculosis Mother   . Tuberculosis Father   . Heart disease Brother   . Diabetes Sister   . Diabetes Brother   .  Clotting disorder Brother       VITAL SIGNS BP 128/70   Pulse 79   Temp (!) 97.3 F (36.3 C)   Resp 14   Ht _0  (1.854 m)   Wt 158 lb 6.4 oz (71.8 kg)   SpO2 94%   BMI 20.90 kg/m   Outpatient Encounter Medications as of 05/11/2017  Medication Sig  . acetaminophen (TYLENOL) 650 MG suppository Place 1 suppository (650 mg total) rectally every 6 (six) hours as needed for fever or mild pain (T over 101).  . ADVAIR DISKUS 250-50 MCG/DOSE AEPB Inhale 1 puff into the lungs 2 (two) times daily.   . Artificial Saliva (BIOTENE MOISTURIZING MOUTH) SOLN Give 1 swab by mouth every 4 hours while awake  . digoxin (LANOXIN) 0.05 MG/ML solution Place 2.5 mLs (0.125 mg total) into feeding tube daily.  . fluticasone (FLONASE) 50 MCG/ACT nasal spray Place 1 spray into both nostrils daily as needed for allergies.  . furosemide (LASIX) 8 MG/ML solution Place 2.5 mLs (20 mg total) into feeding tube daily.  Marland Kitchen gabapentin (NEURONTIN) 100 MG capsule Give 1 Capsule (138m) to 3 capsules (3029m enterally at bedtime as needed  . ipratropium-albuterol (DUONEB) 0.5-2.5 (3) MG/3ML SOLN Take 3 mLs by nebulization every 4 (four) hours as needed.  . Marland Kitchenpratropium-albuterol (DUONEB) 0.5-2.5 (3) MG/3ML SOLN Take 3 mLs by nebulization every 6 (six) hours. X 2 weeks  . Lisinopril 1 MG/ML SOLN Place 2.5 mg into feeding tube daily.  . Marland Kitchenoratadine  (CLARITIN) 10 MG tablet Take 1 tablet (10 mg total) by mouth daily as needed for allergies.  . Marland Kitchenenthol-cetylpyridinium (CEPACOL) 3 MG lozenge Take 1 lozenge by mouth every 2 (two) hours as needed for sore throat.  . metoprolol tartrate (LOPRESSOR) 25 mg/10 mL SUSP Place 10 mLs (25 mg total) into feeding tube 2 (two) times daily.  . nitroGLYCERIN (NITROSTAT) 0.4 MG SL tablet Place 1 tablet (0.4 mg total) under the tongue every 5 (five) minutes as needed for chest pain.  . Nutritional Supplements (FEEDING SUPPLEMENT, JEVITY 1.5 CAL,) LIQD Jevity 1.5 -  Give 240cc every 4 hours  . Nutritional Supplements (NUTRITIONAL SUPPLEMENT PO) NPO - nothing by mouth diet, Nothing by mouth textrue  . OLANZapine zydis (ZYPREXA) 5 MG disintegrating tablet Take 1 tablet (5 mg total) by mouth at bedtime.  . ondansetron (ZOFRAN) 4 MG tablet Place 4 mg into feeding tube every 6 (six) hours as needed for nausea or vomiting.  . pantoprazole sodium (PROTONIX) 40 mg/20 mL PACK Place 20 mLs (40 mg total) into feeding tube daily.  . potassium chloride 20 MEQ/15ML (10%) SOLN Place 7.5 mLs (10 mEq total) into feeding tube daily.  . RESTASIS 0.05 % ophthalmic emulsion INSTILL 1 DROP INTO BOTH EYES TWICE A DAY  . sennosides (SENOKOT) 8.8 MG/5ML syrup Place 5 mLs into feeding tube 2 (two) times daily.  . Marland Kitcheniotropium (SPIRIVA) 18 MCG inhalation capsule Place 18 mcg into inhaler and inhale daily.  . traMADol (ULTRAM) 5 mg/mL SUSP Place 10-20 mLs (50-100 mg total) into feeding tube every 6 (six) hours as needed for moderate pain.  . Water For Irrigation, Sterile (FREE WATER) SOLN Place into feeding tube every 4 (four) hours. 150 ml H2o flushes via tube every 4 hours   No facility-administered encounter medications on file as of 05/11/2017.      SIGNIFICANT DIAGNOSTIC EXAMS  PREVIOUS:   03-26-17: ct of chest abdomen and pelvis: 1. No acute/traumatic intrathoracic, abdominal, or pelvic pathology. 2. Nonocclusive pulmonary  embolus  in the subsegmental right lower lobe pulmonary artery branches likely old PE or scarring. Nonocclusive acute PE is not entirely excluded. Clinical correlation is recommended. 3. Mild cardiomegaly with dilatation of the right atrium. 4. 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 Small right pleural effusion and stable right lung base round atelectasis/scarring. 5. Aortic Atherosclerosis  and Emphysema    04-07-17: chest x-ray: Suspected patchy infiltrate at the right base, short interval radiographic follow-up suggested to ensure clearing. Cardiomegaly.  04-09-17: chest x-ray: COPD, cardiomegaly, stable. Increasing right basilar airspace opacity and small right effusion concerning for pneumonia  04-10-17: swallow study: Recommend NPO with short term alternative nutrition and continued ST   04-19-17: PEG tube insertion   05-01-17: chest x-ray: mild right basilar infiltrate with trace right sided effusion  NO NEW EXAMS      LABS REVIEWED: PREVIOUS:   04-07-17: wbc 9.5; hgb 13.4; hct 41.9; mcv 94.2; plt 101; glucose 84; bun 18; creat 1.28 ;k+ 4.0; na++ 136; ca 8.2; total bili 2.5; albumin 3.2; rapid flu: +A; blood culture: no growth; urine culture: <10,000 colonies 04-09-17: wbc 9.0; hgb 11.5; hct 35.1; mcv 91.9; plt 120; glucose 88; bun 9; creat 0.86; k+ 3.5; na++ 133; ca 7.5; mag 1.7 04-13-17: wbc 9.4; hgb 13.8; hct 42.6; mcv 94.0; plt 217; glucose 115; bun 11; creat 1.04 ;k+ 3.3; na++144; ca 8.7 04-16-17: wbc 6.5; hgb 12.2; hct 38.5; mcv 95.3; plt 185; glucose 115; bun 11; creat 0.78; k+ 4.0; na++ 143; ca 8.9; mag 1.7  04-20-17:wbc 12.4; hgb 12.2; hct 38.0; mcv 93.6; plt 209; glucose 110; bun 18; creat 0.95; k+ 4.7; na++ 136; ca 8.4; mag 2.7;  04-22-17: mag 1.9 05-02-17: wbc 7.1; hgb 11.8; hct 34.0; mcv 87.6; plt 223; glucose 130; bun 22.2; creat 0.71; k+ 4.4; na++ 143; ca 9.1; alk phos 123; albumin 3.4; tsh 0.91   NO NEW LABS     Review of Systems  Reason unable to perform  ROS: poor historian   Constitutional: Negative for malaise/fatigue.  Respiratory: Negative for cough.   Cardiovascular: Negative for chest pain and leg swelling.  Gastrointestinal: Positive for constipation. Negative for abdominal pain and heartburn.  Musculoskeletal: Negative for back pain and joint pain.  Skin: Negative.   Psychiatric/Behavioral: The patient is not nervous/anxious.     Physical Exam  Constitutional: He appears well-developed and well-nourished. No distress.  Neck: No thyromegaly present.  Cardiovascular: Normal rate and intact distal pulses.  Murmur heard. 1/6 Heart rate slightly irregular   Pulmonary/Chest: Effort normal and breath sounds normal. No respiratory distress.  Abdominal: Soft. Bowel sounds are normal. He exhibits no distension. There is no tenderness.  Peg tube present no signs of infection present   Musculoskeletal: He exhibits no edema.  Is able to move all extremities   Lymphadenopathy:    He has no cervical adenopathy.  Neurological: He is alert.  Skin: Skin is warm and dry. He is not diaphoretic.  Psychiatric: He has a normal mood and affect.      ASSESSMENT/ PLAN:  TODAY;   1. COPD( emphysema): is stable: is now on 02 at 2L/[place] to keep sats >91 %. Will continue advair 250/50 twice daily  spiriva 18 mcg daily duoneb every 6 every 4 hours as needed  Is completing the every 6 hours routinely   2. Chronic allergic rhinitis: stable will continue claritin daily as needed and flonase daily as needed  3. Polyneuropathy : is stable has neurontin 300 mg  nightly as needed  4. Gastroesophageal reflux disease without esophagitis: stable will continue protonix 40 mg daily    5. Chronic constipation: worse; will stop senna and will begin miralax 17 gm daily   PREVIOUS    6.  Pharyngoesophageal dysphagia: has chronic aspiration: is currently NPO and is receiving jevity tube feeding. For his dry mouth will begin biotene every 4 hours while awake and  will monitor his status.   7. CKD stage III: stable bun 22.2/creat 0.71  8. Acute confusion: due to acute metabolic encephalopathy is stable will continue zyprexa 5 mg nightly; will need to monitor his status; and should be able to wean this off slowly he remains confused   9.  Adult failure to thrive: weight is 158 pounds; albumin is 3.4; without change in status; will continue to monitor  10. History of right lung PE; he is not a candidate for anticoagulation therapy due to history of falls and subdural hematoma.     11. Chronic systolic heart failure: EF 35-40%: is status post AICD placement:  is stable will continue lasix 20 mg daily with k+ 10 meq daily  lopressor 25 mg twice daily and will monitor his status.   12. Atrial fibrillation with controlled ventricular response: has history of PE  heart rate stable: is not a candidate for anticoagulations due to history of subdural hematoma: will continue lopressor 25 mg twice daily and digoxin 0.125 mg daily   13. Coronary artery disease involving native coronary artery with angina pectoris: is status post CABG/ stent placements: is stable will continue lopressor 25 mg twice daily and has prn ntg.   14. Hypertensive heart disease with heart failure: stable b/p 128/70: will continue lopressor 25 mg twice daily and lisinopril 2.5 mg daily     MD is aware of resident's narcotic use and is in agreement with current plan of care. We will attempt to wean resident as apropriate   Ok Edwards NP [facility]  Contact [phone] Monday through Friday 8am- 5pm  After hours call [phone]

## 2017-05-14 DIAGNOSIS — G629 Polyneuropathy, unspecified: Secondary | ICD-10-CM | POA: Insufficient documentation

## 2017-05-14 DIAGNOSIS — J309 Allergic rhinitis, unspecified: Secondary | ICD-10-CM | POA: Insufficient documentation

## 2017-05-16 ENCOUNTER — Other Ambulatory Visit: Payer: Self-pay | Admitting: *Deleted

## 2017-05-18 ENCOUNTER — Other Ambulatory Visit: Payer: Self-pay | Admitting: *Deleted

## 2017-05-18 ENCOUNTER — Encounter: Payer: Self-pay | Admitting: Adult Health

## 2017-05-18 ENCOUNTER — Non-Acute Institutional Stay (SKILLED_NURSING_FACILITY): Payer: Medicare Other | Admitting: Adult Health

## 2017-05-18 DIAGNOSIS — R627 Adult failure to thrive: Secondary | ICD-10-CM | POA: Diagnosis not present

## 2017-05-18 DIAGNOSIS — R410 Disorientation, unspecified: Secondary | ICD-10-CM | POA: Diagnosis not present

## 2017-05-18 DIAGNOSIS — N183 Chronic kidney disease, stage 3 unspecified: Secondary | ICD-10-CM

## 2017-05-18 DIAGNOSIS — R1314 Dysphagia, pharyngoesophageal phase: Secondary | ICD-10-CM | POA: Diagnosis not present

## 2017-05-18 NOTE — Patient Outreach (Signed)
[facility]) Care Management  05/18/2017  [name] [birth year] [id number]   CSW was able to make brief contact with patient today to follow-up regarding social work services and resources; however, patient was too confused to speak with CSW; therefore, CSW spoke with patient's attending nurse at [facility] at [address], [place] where patient currently resides to receive short-term rehabilitative services.  According to [name], patient's attending nurse, "some days are better than others", with regards to patient's level of confusion.  [name] agrees that patient really needs long-term care placement, but encouraged CSW to speak with patient's son, [name] regarding patient's discharge planning arrangements.  CSW left a HIPAA compliant message for [name] and is currently awaiting a return call. [name] reports that patient is working well with therapies (physical, occupational and speech), but appears to be somewhat impulsive, which has resulted in several falls, without injury.  [name] stated that patient is being treated with antibiotics for Pneumonia and continues to receive tube feedings.  [name] indicated that patient's progression is very slow, which she attributes to patient's confusion.  [name] stated that patient does not have a tentative discharge date set, as she anticipates that patient will need to remain at [facility] [place] for a while longer, if he does not eventually get moved to a long-term care bed.  CSW agreed to follow-up with patient and [name] again next week to receive an update on patient's status. [name], BSW, MSW, LCSW  Licensed Education officer, [facility]  Mailing [place] [address] [place], [place] [address] Physical Address-[address], [place], [place] [address] Toll Free Main # [phone] Fax # [phone] Cell # [phone]  Office #  [phone] [email or web address]

## 2017-05-18 NOTE — Patient Outreach (Signed)
[facility]) Care Management   05/16/2017  [name] 1933/01/19 [id number]   This CSW attempted to reach pt/family and SNF rep for updates on SNF progress. CSW awaits call back.   Eduard Clos, MSW, [place] Worker  [place] [phone]

## 2017-05-18 NOTE — Progress Notes (Signed)
Location:   [facility] Room Number: 121 A Place of Service:  SNF (31)   CODE STATUS: Full Code  Allergies  Allergen Reactions  . Tamsulosin Other (See Comments)    Dizziness, Made BP very low and weakness  . Celebrex [Celecoxib] Hives and Nausea And Vomiting    Gi upset  . Dronedarone Nausea And Vomiting and Other (See Comments)    GI upset, abdominal pain  . Esomeprazole Magnesium Hives and Other (See Comments)    "don't really remember"  . Digoxin Diarrhea    May have caused some diarrhea  . Hydrocodone-Acetaminophen Other (See Comments)    Bad headache  . Protonix [Pantoprazole Sodium] Nausea And Vomiting and Other (See Comments)    Tolerates Dexilant    Chief Complaint  Patient presents with  . Medical Management of Chronic Issues    Dysphagia; ckd; failure to thrive; confusion; weekly follow up for first 30 days following hospitalization     HPI:  He is a 84 year old rehab resident of this facility being for the management of his chronic illnesses: dysphagia; ckd; failure to thrive; confusion. He is a poor historian; but denies any anxiety; no choking; no pain. There are no nursing concerns at this time.   Past Medical History:  Diagnosis Date  . Abnormal thyroid scan    Abnormal thyroid imaging studies from 11/09/2010, status post ultrasound guided fine needle aspiration of the dominant left inferior thyroid nodule on 12/15/2010. Cytology report showed rare follicular epithelial cells and hemosiderin laden macrophages.  . Adenomatous colon polyp   . AICD (automatic cardioverter/defibrillator) present    a. fx lead; a. s/p lead extraction 03/02/17  . Arthritis    "all over"  . Asthma   . Atrial fibrillation ([place])    on chronic Coumadin; stopped July 2013 due to subdural hematomas  . CHF (congestive heart failure) (HCC)    EF 35-40% s/p most recent ICD generator change-out with Medtronic dual-chamber ICD 05/20/11 with explantation of previous  abdominally-implanted device  . Coronary artery disease    s/p CABG 1983 and PCI/stent 2004.   . Diabetes mellitus    diet controlled  . Diverticulosis   . Dyslipidemia   . Enteritis   . Erythrocytosis   . GERD (gastroesophageal reflux disease)   . Hepatic steatosis   . Hypertension   . Ischemic cardiomyopathy    WITH CHF  . Monocytosis 04/17/2013  . Myocardial infarction ([place]) 1983; ~ 1990  . Pleural effusion    right  . Pneumonia August 2013  . Portal hypertensive gastropathy ([place])   . Rectal ulcer   . Renal calculi   . Subdural hematoma [facility]) July 2013   Anticoagulation stopped.   . SunDown syndrome   . VT (ventricular tachycardia) ([place])     Past Surgical History:  Procedure Laterality Date  . CHOLECYSTECTOMY    . COLONOSCOPY  07/07/2011   Procedure: COLONOSCOPY;  Surgeon: Jerene Bears, MD;  Location: [facility];  Service: Gastroenterology;  Laterality: N/A;  . CORONARY ANGIOPLASTY WITH STENT PLACEMENT  2004   Tandem Cypher stents LAD  . CORONARY ARTERY BYPASS GRAFT  1983   SVG-mLAD  . ESOPHAGOGASTRODUODENOSCOPY  02/11/2011   Procedure: ESOPHAGOGASTRODUODENOSCOPY (EGD);  Surgeon: Beryle Beams, MD;  Location: [facility];  Service: Endoscopy;  Laterality: N/A;  . FLEXIBLE SIGMOIDOSCOPY N/A 02/08/2017   Procedure: FLEXIBLE SIGMOIDOSCOPY;  Surgeon: Irene Shipper, MD;  Location: [facility];  Service: Endoscopy;  Laterality: N/A;  .  ICD GENERATOR CHANGEOUT N/A 04/08/2016   Procedure: ICD Generator Changeout;  Surgeon: Evans Lance, MD;  Location: [place] [facility];  Service: Cardiovascular;  Laterality: N/A;  . ICD LEAD REMOVAL N/A 03/02/2017   Procedure: ICD LEAD REMOVAL;  Surgeon: Evans Lance, MD;  Location: [place];  Service: Cardiovascular;  Laterality: N/A;  . IMPLANTABLE CARDIOVERTER DEFIBRILLATOR (ICD) GENERATOR CHANGE N/A 05/20/2011   Procedure: ICD GENERATOR CHANGE;  Surgeon: Evans Lance, MD;  Medtronic secure dual-chamber ICD serial number [id number]   . IR  GASTROSTOMY TUBE MOD SED  04/19/2017  . KNEE ARTHROSCOPY     right; "just went in and scraped it"  . LEAD INSERTION N/A 03/02/2017   Procedure: LEAD INSERTION;  Surgeon: Evans Lance, MD;  Location: [place];  Service: Cardiovascular;  Laterality: N/A;  . MASS EXCISION Right 05/10/2013   Procedure: EXCISION MASS RIGHT THUMB;  Surgeon: Wynonia Sours, MD;  Location: [place];  Service: Orthopedics;  Laterality: Right;  . PROXIMAL INTERPHALANGEAL FUSION (PIP) Right 05/10/2013   Procedure: DEBRIDEMENT PROXIMAL INTERPHALANGEAL FUSION (PIP);  Surgeon: Wynonia Sours, MD;  Location: [place];  Service: Orthopedics;  Laterality: Right;  . TONSILLECTOMY          Social History   Socioeconomic History  . Marital status: Divorced    Spouse name: Not on file  . Number of children: 2  . Years of education: Not on file  . Highest education level: Not on file  Occupational History  . Occupation: Sports coach: RETIRED  Social Needs  . Financial resource strain: Not on file  . Food insecurity:    Worry: Not on file    Inability: Not on file  . Transportation needs:    Medical: Not on file    Non-medical: Not on file  Tobacco Use  . Smoking status: Former Smoker    Packs/day: 2.00    Years: 30.00    Pack years: 60.00    Types: Cigarettes    Last attempt to quit: 06/23/1976    Years since quitting: 40.9  . Smokeless tobacco: Never Used  Substance and Sexual Activity  . Alcohol use: No  . Drug use: No  . Sexual activity: Never  Lifestyle  . Physical activity:    Days per week: Not on file    Minutes per session: Not on file  . Stress: Not on file  Relationships  . Social connections:    Talks on phone: Not on file    Gets together: Not on file    Attends religious service: Not on file    Active member of club or organization: Not on file    Attends meetings of clubs or organizations: Not on file    Relationship status: Not on file  . Intimate  partner violence:    Fear of current or ex partner: Not on file    Emotionally abused: Not on file    Physically abused: Not on file    Forced sexual activity: Not on file  Other Topics Concern  . Not on file  Social History Narrative  . Not on file   Family History  Problem Relation Age of Onset  . Tuberculosis Mother   . Tuberculosis Father   . Heart disease Brother   . Diabetes Sister   . Diabetes Brother   . Clotting disorder Brother       VITAL SIGNS BP 134/72   Pulse 80  Temp (!) 97.1 F (36.2 C)   Resp 18   Ht _0  (1.854 m)   Wt 158 lb 6.4 oz (71.8 kg)   SpO2 96%   BMI 20.90 kg/m   Outpatient Encounter Medications as of 05/18/2017  Medication Sig  . acetaminophen (TYLENOL) 650 MG suppository Place 1 suppository (650 mg total) rectally every 6 (six) hours as needed for fever or mild pain (T over 101).  . ADVAIR DISKUS 250-50 MCG/DOSE AEPB Inhale 1 puff into the lungs 2 (two) times daily.   . Artificial Saliva (BIOTENE MOISTURIZING MOUTH) SOLN Give 1 swab by mouth every 4 hours while awake  . digoxin (LANOXIN) 0.05 MG/ML solution Place 2.5 mLs (0.125 mg total) into feeding tube daily.  . fluticasone (FLONASE) 50 MCG/ACT nasal spray Place 1 spray into both nostrils daily as needed for allergies.  . furosemide (LASIX) 10 MG/ML solution Take 20 mg by mouth daily.  Marland Kitchen gabapentin (NEURONTIN) 100 MG capsule Give 1 Capsule (125m) to 3 capsules (3039m enterally at bedtime as needed  . ipratropium-albuterol (DUONEB) 0.5-2.5 (3) MG/3ML SOLN Take 3 mLs by nebulization every 4 (four) hours as needed.  . Lisinopril 1 MG/ML SOLN Place 2.5 mg into feeding tube daily.  . Marland Kitchenoratadine (CLARITIN) 10 MG tablet Take 1 tablet (10 mg total) by mouth daily as needed for allergies.  . Marland Kitchenenthol-cetylpyridinium (CEPACOL) 3 MG lozenge Take 1 lozenge by mouth every 2 (two) hours as needed for sore throat.  . metoprolol tartrate (LOPRESSOR) 25 mg/10 mL SUSP Place 10 mLs (25 mg total) into  feeding tube 2 (two) times daily.  . nitroGLYCERIN (NITROSTAT) 0.4 MG SL tablet Place 1 tablet (0.4 mg total) under the tongue every 5 (five) minutes as needed for chest pain.  . Nutritional Supplements (FEEDING SUPPLEMENT, JEVITY 1.5 CAL,) LIQD Jevity 1.5 -  Give 240cc every 4 hours  . Nutritional Supplements (NUTRITIONAL SUPPLEMENT PO) NPO - nothing by mouth diet, Nothing by mouth textrue  . OLANZapine zydis (ZYPREXA) 5 MG disintegrating tablet Take 1 tablet (5 mg total) by mouth at bedtime.  . ondansetron (ZOFRAN) 4 MG tablet Place 4 mg into feeding tube every 6 (six) hours as needed for nausea or vomiting.  . pantoprazole sodium (PROTONIX) 40 mg/20 mL PACK Place 20 mLs (40 mg total) into feeding tube daily.  . polyethylene glycol (MIRALAX / GLYCOLAX) packet Place 17 g into feeding tube daily.  . potassium chloride 20 MEQ/15ML (10%) SOLN Place 7.5 mLs (10 mEq total) into feeding tube daily.  . RESTASIS 0.05 % ophthalmic emulsion INSTILL 1 DROP INTO BOTH EYES TWICE A DAY  . saccharomyces boulardii (FLORASTOR) 250 MG capsule Place 250 mg into feeding tube 2 (two) times daily.  . Marland Kitcheniotropium (SPIRIVA) 18 MCG inhalation capsule Place 18 mcg into inhaler and inhale daily.  . traMADol (ULTRAM) 5 mg/mL SUSP Place 10-20 mLs (50-100 mg total) into feeding tube every 6 (six) hours as needed for moderate pain.  . Water For Irrigation, Sterile (FREE WATER) SOLN Place into feeding tube every 4 (four) hours. 150 ml H2o flushes via tube every 4 hours  . ipratropium-albuterol (DUONEB) 0.5-2.5 (3) MG/3ML SOLN Take 3 mLs by nebulization every 6 (six) hours. X 2 weeks  . [DISCONTINUED] furosemide (LASIX) 8 MG/ML solution Place 2.5 mLs (20 mg total) into feeding tube daily. (Patient not taking: Reported on 05/18/2017)  . [DISCONTINUED] sennosides (SENOKOT) 8.8 MG/5ML syrup Place 5 mLs into feeding tube 2 (two) times daily. (Patient not taking: Reported on 05/18/2017)  No facility-administered encounter medications on  file as of 05/18/2017.      SIGNIFICANT DIAGNOSTIC EXAMS  PREVIOUS:   03-26-17: ct of chest abdomen and pelvis: 1. No acute/traumatic intrathoracic, abdominal, or pelvic pathology. 2. Nonocclusive pulmonary embolus in the subsegmental right lower lobe pulmonary artery branches likely old PE or scarring. Nonocclusive acute PE is not entirely excluded. Clinical correlation is recommended. 3. Mild cardiomegaly with dilatation of the right atrium. 4. 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 Small right pleural effusion and stable right lung base round atelectasis/scarring. 5. Aortic Atherosclerosis  and Emphysema    04-07-17: chest x-ray: Suspected patchy infiltrate at the right base, short interval radiographic follow-up suggested to ensure clearing. Cardiomegaly.  04-09-17: chest x-ray: COPD, cardiomegaly, stable. Increasing right basilar airspace opacity and small right effusion concerning for pneumonia  04-10-17: swallow study: Recommend NPO with short term alternative nutrition and continued ST   04-19-17: PEG tube insertion   05-01-17: chest x-ray: mild right basilar infiltrate with trace right sided effusion  NO NEW EXAMS      LABS REVIEWED: PREVIOUS:   04-07-17: wbc 9.5; hgb 13.4; hct 41.9; mcv 94.2; plt 101; glucose 84; bun 18; creat 1.28 ;k+ 4.0; na++ 136; ca 8.2; total bili 2.5; albumin 3.2; rapid flu: +A; blood culture: no growth; urine culture: <10,000 colonies 04-09-17: wbc 9.0; hgb 11.5; hct 35.1; mcv 91.9; plt 120; glucose 88; bun 9; creat 0.86; k+ 3.5; na++ 133; ca 7.5; mag 1.7 04-13-17: wbc 9.4; hgb 13.8; hct 42.6; mcv 94.0; plt 217; glucose 115; bun 11; creat 1.04 ;k+ 3.3; na++144; ca 8.7 04-16-17: wbc 6.5; hgb 12.2; hct 38.5; mcv 95.3; plt 185; glucose 115; bun 11; creat 0.78; k+ 4.0; na++ 143; ca 8.9; mag 1.7  04-20-17:wbc 12.4; hgb 12.2; hct 38.0; mcv 93.6; plt 209; glucose 110; bun 18; creat 0.95; k+ 4.7; na++ 136; ca 8.4; mag 2.7;  04-22-17: mag  1.9 05-02-17: wbc 7.1; hgb 11.8; hct 34.0; mcv 87.6; plt 223; glucose 130; bun 22.2; creat 0.71; k+ 4.4; na++ 143; ca 9.1; alk phos 123; albumin 3.4; tsh 0.91   NO NEW LABS     Review of Systems  Reason unable to perform ROS: poor historian   Constitutional: Negative for malaise/fatigue.  Respiratory: Negative for cough.   Cardiovascular: Negative for chest pain.  Gastrointestinal: Negative for abdominal pain and constipation.  Musculoskeletal: Negative for back pain.  Skin: Negative.   Neurological: Negative for dizziness.  Psychiatric/Behavioral: The patient is not nervous/anxious.    Physical Exam  Constitutional: He appears well-developed and well-nourished. No distress.  Neck: No thyromegaly present.  Cardiovascular: Normal rate and intact distal pulses.  Murmur heard. 1/6 heart rate slightly irregular   Pulmonary/Chest: Effort normal and breath sounds normal. No respiratory distress.  Abdominal: Soft. Bowel sounds are normal. He exhibits no distension. There is no tenderness.  Peg tube site without signs of infection present.   Musculoskeletal: Normal range of motion. He exhibits no edema.  Lymphadenopathy:    He has no cervical adenopathy.  Neurological: He is alert.  Skin: Skin is warm and dry. He is not diaphoretic.  Psychiatric: He has a normal mood and affect.     ASSESSMENT/ PLAN:  TODAY;  1.  Pharyngoesophageal dysphagia: has chronic aspiration: is currently NPO and is receiving jevity tube feeding. For his dry mouth will begin biotene every 4 hours while awake and will monitor his status.   2. CKD stage III: stable bun 22.2/creat 0.71  3. Acute confusion: due to acute metabolic encephalopathy is stable will continue zyprexa 5 mg nightly; will need to monitor his status; and should be able to wean this off slowly he remains confused   4.  Adult failure to thrive: weight is 158 pounds; albumin is 3.4; without change in status; will continue to  monitor  PREVIOUS    5. History of right lung PE; he is not a candidate for anticoagulation therapy due to history of falls and subdural hematoma.     6. Chronic systolic heart failure: EF 35-40%: is status post AICD placement:  is stable will continue lasix 20 mg daily with k+ 10 meq daily  lopressor 25 mg twice daily and will monitor his status.   7. Atrial fibrillation with controlled ventricular response: has history of PE  heart rate stable: is not a candidate for anticoagulations due to history of subdural hematoma: will continue lopressor 25 mg twice daily and digoxin 0.125 mg daily   8. Coronary artery disease involving native coronary artery with angina pectoris: is status post CABG/ stent placements: is stable will continue lopressor 25 mg twice daily and has prn ntg.   9. Hypertensive heart disease with heart failure: stable b/p 134/72: will continue lopressor 25 mg twice daily and lisinopril 2.5 mg daily  10. COPD( emphysema): is stable: is now on 02 at 2L/[place] as needed to keep sats >91 %. Will continue advair 250/50 twice daily  spiriva 18 mcg daily duoneb every 6 every 4 hours as needed   11. Chronic allergic rhinitis: stable will continue claritin daily as needed and flonase daily as needed  12. Polyneuropathy : is stable has neurontin 300 mg nightly as needed  13. Gastroesophageal reflux disease without esophagitis: stable will continue protonix 40 mg daily    14. Chronic constipation: stable will not make changes will monitor      MD is aware of resident's narcotic use and is in agreement with current plan of care. We will attempt to wean resident as apropriate   Ok Edwards NP [facility]  Contact [phone] Monday through Friday 8am- 5pm  After hours call [phone]

## 2017-05-19 ENCOUNTER — Ambulatory Visit: Payer: Self-pay | Admitting: *Deleted

## 2017-05-22 ENCOUNTER — Encounter: Payer: Self-pay | Admitting: Pulmonary Disease

## 2017-05-22 ENCOUNTER — Ambulatory Visit (INDEPENDENT_AMBULATORY_CARE_PROVIDER_SITE_OTHER)
Admission: RE | Admit: 2017-05-22 | Discharge: 2017-05-22 | Disposition: A | Discharge status: Home / Self Care | Payer: Medicare Other | Source: Ambulatory Visit | Attending: Pulmonary Disease | Admitting: Pulmonary Disease

## 2017-05-22 ENCOUNTER — Ambulatory Visit (INDEPENDENT_AMBULATORY_CARE_PROVIDER_SITE_OTHER): Payer: Medicare Other | Admitting: Pulmonary Disease

## 2017-05-22 VITALS — BP 108/68 | HR 71 | Ht 73.0 in | Wt 159.8 lb

## 2017-05-22 DIAGNOSIS — I25119 Atherosclerotic heart disease of native coronary artery with unspecified angina pectoris: Secondary | ICD-10-CM | POA: Diagnosis not present

## 2017-05-22 DIAGNOSIS — J449 Chronic obstructive pulmonary disease, unspecified: Secondary | ICD-10-CM

## 2017-05-22 DIAGNOSIS — Z8701 Personal history of pneumonia (recurrent): Secondary | ICD-10-CM

## 2017-05-22 DIAGNOSIS — J189 Pneumonia, unspecified organism: Secondary | ICD-10-CM | POA: Diagnosis not present

## 2017-05-22 NOTE — Progress Notes (Signed)
   Subjective:    Patient ID: Elmo Putt, male    DOB: 01/09/1933, 84 y.o.   MRN: [id number]  HPI    Review of Systems  Constitutional: Negative for fever and unexpected weight change.  HENT: Positive for trouble swallowing. Negative for congestion, dental problem, ear pain, nosebleeds, postnasal drip, rhinorrhea, sinus pressure, sneezing and sore throat.   Eyes: Negative for redness and itching.  Respiratory: Positive for cough. Negative for chest tightness, shortness of breath and wheezing.   Cardiovascular: Negative for palpitations and leg swelling.  Gastrointestinal: Positive for nausea. Negative for vomiting.  Genitourinary: Negative for dysuria.  Musculoskeletal: Negative for joint swelling.  Skin: Negative for rash.  Allergic/Immunologic: Negative.  Negative for environmental allergies, food allergies and immunocompromised state.  Neurological: Positive for headaches.  Hematological: Does not bruise/bleed easily.  Psychiatric/Behavioral: Negative for dysphoric mood. The patient is not nervous/anxious.        Objective:   Physical Exam        Assessment & Plan:

## 2017-05-22 NOTE — Patient Instructions (Signed)
Chest xray today  Will set up overnight oxygen test on room air  Follow up in 4 months

## 2017-05-22 NOTE — Progress Notes (Signed)
[facility]  Chief Complaint  Patient presents with  . New Consult    cough, shortness of breath     Vital signs: BP 108/68 (BP Location: Left Arm, Cuff Size: Normal)   Pulse 71   Ht 6\' 1"  (1.854 m)   Wt 159 lb 12.8 oz (72.5 kg)   SpO2 96%   BMI 21.08 kg/m   History of Present Illness: [name] is a 84 y.o. male former smoker with cough.  He is only able to provide limited history.  He is not sure why appointment was made today.  He was in hospital in March for Influenza pneumonia and aspiration pneumonia.  He has history of COPD with emphysema.  He has PEG tube.  He was sent home with oxygen from the hospital.  He no longer smokes.    He brings up clear sputum.  He is not having fever, chest pain, wheeze, or hemoptysis.  He has trouble walking, and gets around in a wheel chair.  He is able to keep up with most of his ADLs otherwise.     Physical Exam:  General - pleasant, sitting in wheel chair Eyes - pupils reactive ENT - no sinus tenderness, no oral exudate, no LAN Cardiac - regular, no murmur Chest - no wheeze, rales Abd - soft, non tender Ext - no edema Skin - no rashes Neuro - normal strength Psych - normal mood   CMP Latest Ref Rng & Units 05/02/2017 04/24/2017 04/23/2017  Glucose 65 - 99 mg/dL - 147(H) 165(H)  BUN 4 - 21 22(A) 15 15  Creatinine 0.6 - 1.3 0.7 0.81 0.85  Sodium 137 - 147 143 138 137  Potassium 3.4 - 5.3 4.4 4.1 3.9  Chloride 101 - 111 mmol/L - 100(L) 103  CO2 22 - 32 mmol/L - 25 25  Calcium 8.9 - 10.3 mg/dL - 8.6(L) 8.6(L)  Total Protein 6.5 - 8.1 g/dL - - -  Total Bilirubin 0.3 - 1.2 mg/dL - - -  Alkaline Phos 25 - 125 132(A) - -  AST 14 - 40 17 - -  ALT 10 - 40 8(A) - -    CBC Latest Ref Rng & Units 05/02/2017 04/23/2017 04/21/2017  WBC - 7.1 7.1 8.5  Hemoglobin 13.5 - 17.5 11.8(A) 11.6(L) 11.9(L)  Hematocrit 41 - 53 34(A) 36.4(L) 37.2(L)  Platelets 150 - 399 223 252 235     Assessment/Plan:  Recent episode of pneumonia from influenza and aspiration. - repeat chest xray today  COPD with emphysema. - continue advair, spiriva - prn duoneb  Acute hypoxic respiratory failure from pneumonia. - SpO2 on room air today acceptable - no longer needs supplemental oxygen during the day - will arrange for ONO with RA to determine if he can have supplemental oxygen d/c'ed   Patient Instructions  Chest xray today  Will set up overnight oxygen test on room air  Follow up in 4 months    Chesley Mires, MD [place] 05/22/2017, 5:40 PM  Flow Sheet  Pulmonary tests: CT chest 03/26/17 >> emphysema, PE  Cardiac tests: Echo 03/29/17 >> reduced systolic function, diffuse hypokinesis  Review of Systems: Review of Systems  Constitutional: Negative for fever and unexpected weight change.  HENT: Positive for trouble swallowing. Negative for congestion, dental problem, ear pain, nosebleeds, postnasal drip, rhinorrhea, sinus pressure, sneezing and sore throat.   Eyes: Negative for redness and itching.  Respiratory: Positive for cough. Negative for chest tightness,  shortness of breath and wheezing.   Cardiovascular: Negative for palpitations and leg swelling.  Gastrointestinal: Positive for nausea. Negative for vomiting.  Genitourinary: Negative for dysuria.  Musculoskeletal: Negative for joint swelling.  Skin: Negative for rash.  Allergic/Immunologic: Negative.  Negative for environmental allergies, food allergies and immunocompromised state.  Neurological: Positive for headaches.  Hematological: Does not bruise/bleed easily.  Psychiatric/Behavioral: Negative for dysphoric mood. The patient is not nervous/anxious.    Past Medical History: He  has a past medical history of Abnormal thyroid scan, Adenomatous colon polyp, AICD (automatic cardioverter/defibrillator) present, Arthritis, Asthma, Atrial fibrillation ([place]), CHF (congestive heart  failure) ([place]), Coronary artery disease, Diabetes mellitus, Diverticulosis, Dyslipidemia, Enteritis, Erythrocytosis, GERD (gastroesophageal reflux disease), Hepatic steatosis, Hypertension, Ischemic cardiomyopathy, Monocytosis (04/17/2013), Myocardial infarction ([place]) (1983; ~ 1990), Pleural effusion, Pneumonia (August 2013), Portal hypertensive gastropathy [facility]), Rectal ulcer, Renal calculi, Subdural hematoma [facility]) (July 2013), SunDown syndrome, and VT (ventricular tachycardia) ([place]).  Past Surgical History: He  has a past surgical history that includes Coronary artery bypass graft (1983); Cholecystectomy; Esophagogastroduodenoscopy (02/11/2011); Colonoscopy (07/07/2011); Tonsillectomy; Coronary angioplasty with stent (2004); Knee arthroscopy; Mass excision (Right, 05/10/2013); Proximal interphalangeal fusion (pip) (Right, 05/10/2013); implantable cardioverter defibrillator (icd) generator change (N/A, 05/20/2011); ICD GENERATOR CHANGEOUT (N/A, 04/08/2016); Flexible sigmoidoscopy (N/A, 02/08/2017); Icd lead removal (N/A, 03/02/2017); LEAD INSERTION (N/A, 03/02/2017); and IR GASTROSTOMY TUBE MOD SED (04/19/2017).  Family History: His family history includes Clotting disorder in his brother; Diabetes in his brother and sister; Heart disease in his brother; Tuberculosis in his father and mother.  Social History: He  reports that he quit smoking about 40 years ago. His smoking use included cigarettes. He has a 60.00 pack-year smoking history. He has never used smokeless tobacco. He reports that he does not drink alcohol or use drugs.  Medications: Allergies as of 05/22/2017      Reactions   Tamsulosin Other (See Comments)   Dizziness, Made BP very low and weakness   Celebrex [celecoxib] Hives, Nausea And Vomiting   Gi upset   Dronedarone Nausea And Vomiting, Other (See Comments)   GI upset, abdominal pain   Esomeprazole Magnesium Hives, Other (See Comments)   "don't really remember"   Digoxin Diarrhea   May  have caused some diarrhea   Hydrocodone-acetaminophen Other (See Comments)   Bad headache   Protonix [pantoprazole Sodium] Nausea And Vomiting, Other (See Comments)   Tolerates Dexilant      Medication List        Accurate as of 05/22/17  5:40 PM. Always use your most recent med list.          acetaminophen 650 MG suppository Commonly known as:  TYLENOL Place 1 suppository (650 mg total) rectally every 6 (six) hours as needed for fever or mild pain (T over 101).   ADVAIR DISKUS 250-50 MCG/DOSE Aepb Generic drug:  Fluticasone-Salmeterol Inhale 1 puff into the lungs 2 (two) times daily.   BIOTENE MOISTURIZING MOUTH Soln Give 1 swab by mouth every 4 hours while awake   digoxin 0.05 MG/ML solution Commonly known as:  LANOXIN Place 2.5 mLs (0.125 mg total) into feeding tube daily.   NUTRITIONAL SUPPLEMENT PO NPO - nothing by mouth diet, Nothing by mouth textrue   feeding supplement (JEVITY 1.5 CAL) Liqd Jevity 1.5 -  Give 240cc every 4 hours   fluticasone 50 MCG/ACT nasal spray Commonly known as:  FLONASE Place 1 spray into both nostrils daily as needed for allergies.   free water Soln Place into feeding tube every 4 (  four) hours. 150 ml H2o flushes via tube every 4 hours   furosemide 10 MG/ML solution Commonly known as:  LASIX Take 20 mg by mouth daily.   gabapentin 100 MG capsule Commonly known as:  NEURONTIN Give 1 Capsule (100mg ) to 3 capsules (300mg ) enterally at bedtime as needed   ipratropium-albuterol 0.5-2.5 (3) MG/3ML Soln Commonly known as:  DUONEB Take 3 mLs by nebulization every 4 (four) hours as needed.   ipratropium-albuterol 0.5-2.5 (3) MG/3ML Soln Commonly known as:  DUONEB Take 3 mLs by nebulization every 6 (six) hours. X 2 weeks   Lisinopril 1 MG/ML Soln Place 2.5 mg into feeding tube daily.   loratadine 10 MG tablet Commonly known as:  CLARITIN Take 1 tablet (10 mg total) by mouth daily as needed for allergies.   menthol-cetylpyridinium  3 MG lozenge Commonly known as:  CEPACOL Take 1 lozenge by mouth every 2 (two) hours as needed for sore throat.   metoprolol tartrate 25 mg/10 mL Susp Commonly known as:  LOPRESSOR Place 10 mLs (25 mg total) into feeding tube 2 (two) times daily.   nitroGLYCERIN 0.4 MG SL tablet Commonly known as:  NITROSTAT Place 1 tablet (0.4 mg total) under the tongue every 5 (five) minutes as needed for chest pain.   OLANZapine zydis 5 MG disintegrating tablet Commonly known as:  ZYPREXA Take 1 tablet (5 mg total) by mouth at bedtime.   ondansetron 4 MG tablet Commonly known as:  ZOFRAN Place 4 mg into feeding tube every 6 (six) hours as needed for nausea or vomiting.   pantoprazole sodium 40 mg/20 mL Pack Commonly known as:  PROTONIX Place 20 mLs (40 mg total) into feeding tube daily.   polyethylene glycol packet Commonly known as:  MIRALAX / GLYCOLAX Place 17 g into feeding tube daily.   potassium chloride 20 MEQ/15ML (10%) Soln Place 7.5 mLs (10 mEq total) into feeding tube daily.   RESTASIS 0.05 % ophthalmic emulsion Generic drug:  cycloSPORINE INSTILL 1 DROP INTO BOTH EYES TWICE A DAY   saccharomyces boulardii 250 MG capsule Commonly known as:  FLORASTOR Place 250 mg into feeding tube 2 (two) times daily.   sennosides 8.8 MG/5ML syrup Commonly known as:  SENOKOT Take 5 mLs by mouth 2 (two) times daily as needed for mild constipation.   tiotropium 18 MCG inhalation capsule Commonly known as:  SPIRIVA Place 18 mcg into inhaler and inhale daily.   traMADol 5 mg/mL Susp Commonly known as:  ULTRAM Place 10-20 mLs (50-100 mg total) into feeding tube every 6 (six) hours as needed for moderate pain.

## 2017-05-23 ENCOUNTER — Telehealth: Payer: Self-pay | Admitting: Pulmonary Disease

## 2017-05-23 NOTE — Telephone Encounter (Signed)
Dg Chest 2 View  Result Date: 05/23/2017 CLINICAL DATA:  Follow-up pneumonia, history asthma, atrial fibrillation, CHF, MI EXAM: CHEST - 2 VIEW COMPARISON:  04/09/2017 FINDINGS: LEFT subclavian AICD lead projects at RIGHT ventricle. Epicardial pacing leads. Normal heart size post CABG. Mediastinal contours and pulmonary vascularity normal. Atherosclerotic calcification aorta. Emphysematous changes with pleural thickening versus small effusion at RIGHT base. Mild RIGHT basilar atelectasis. Improved RIGHT basilar infiltrate since previous exam. No new infiltrate or pneumothorax. Scattered degenerative disc disease changes of the thoracic spine. IMPRESSION: COPD changes with mild atelectasis and small pleural effusion versus pleural thickening at RIGHT base. Improved RIGHT basilar infiltrates since previous exam. Electronically Signed   By: Lavonia Dana M.D.   On: 05/23/2017 08:42     Attempted to call pt.  No answer or number invalid.  Will have my nurse inform pt that CXR shows changes from COPD.  Previous pneumonia has resolved.  No change to current tx plan.

## 2017-05-24 ENCOUNTER — Non-Acute Institutional Stay (SKILLED_NURSING_FACILITY): Payer: Medicare Other | Admitting: Adult Health

## 2017-05-24 ENCOUNTER — Encounter: Payer: Self-pay | Admitting: Adult Health

## 2017-05-24 DIAGNOSIS — B370 Candidal stomatitis: Secondary | ICD-10-CM | POA: Diagnosis not present

## 2017-05-24 DIAGNOSIS — I25119 Atherosclerotic heart disease of native coronary artery with unspecified angina pectoris: Secondary | ICD-10-CM

## 2017-05-24 DIAGNOSIS — I5022 Chronic systolic (congestive) heart failure: Secondary | ICD-10-CM | POA: Diagnosis not present

## 2017-05-24 DIAGNOSIS — I2782 Chronic pulmonary embolism: Secondary | ICD-10-CM | POA: Diagnosis not present

## 2017-05-24 DIAGNOSIS — I482 Chronic atrial fibrillation, unspecified: Secondary | ICD-10-CM

## 2017-05-24 NOTE — Telephone Encounter (Signed)
ATC pt x3 and received recording stating that "the number you have called is not a working number". ATC pt's son, Kanin [facility]) to verify pt's best contact number. Unable to leave vm for [place], due to mailbox not being setup.  Will call back.

## 2017-05-24 NOTE — Progress Notes (Signed)
Location:   [facility] Room Number: 121 A Place of Service:  SNF (31)   CODE STATUS: Full Code  Allergies  Allergen Reactions  . Tamsulosin Other (See Comments)    Dizziness, Made BP very low and weakness  . Celebrex [Celecoxib] Hives and Nausea And Vomiting    Gi upset  . Dronedarone Nausea And Vomiting and Other (See Comments)    GI upset, abdominal pain  . Esomeprazole Magnesium Hives and Other (See Comments)    "don't really remember"  . Digoxin Diarrhea    May have caused some diarrhea  . Hydrocodone-Acetaminophen Other (See Comments)    Bad headache  . Protonix [Pantoprazole Sodium] Nausea And Vomiting and Other (See Comments)    Tolerates Dexilant    Chief Complaint  Patient presents with  . Medical Management of Chronic Issues    Cad; chf; afib; pe; weekly follow up for 30 days post hospitalization     HPI:  He is a 84 year old rehab patient being seen for the management of his chronic illnesses: cad; systolic heart failure; afib; PE. Speech therapy reports that he has a significant thrush on his tongue and mouth. He states this is making it difficult for him to swallow. He denies chest pain; cough or shortness of breath. There are no nursing concerns at this time.   Past Medical History:  Diagnosis Date  . Abnormal thyroid scan    Abnormal thyroid imaging studies from 11/09/2010, status post ultrasound guided fine needle aspiration of the dominant left inferior thyroid nodule on 12/15/2010. Cytology report showed rare follicular epithelial cells and hemosiderin laden macrophages.  . Adenomatous colon polyp   . AICD (automatic cardioverter/defibrillator) present    a. fx lead; a. s/p lead extraction 03/02/17  . Arthritis    "all over"  . Asthma   . Atrial fibrillation ([place])    on chronic Coumadin; stopped July 2013 due to subdural hematomas  . CHF (congestive heart failure) (HCC)    EF 35-40% s/p most recent ICD generator change-out with  Medtronic dual-chamber ICD 05/20/11 with explantation of previous abdominally-implanted device  . Coronary artery disease    s/p CABG 1983 and PCI/stent 2004.   . Diabetes mellitus    diet controlled  . Diverticulosis   . Dyslipidemia   . Enteritis   . Erythrocytosis   . GERD (gastroesophageal reflux disease)   . Hepatic steatosis   . Hypertension   . Ischemic cardiomyopathy    WITH CHF  . Monocytosis 04/17/2013  . Myocardial infarction ([place]) 1983; ~ 1990  . Pleural effusion    right  . Pneumonia August 2013  . Portal hypertensive gastropathy ([place])   . Rectal ulcer   . Renal calculi   . Subdural hematoma [facility]) July 2013   Anticoagulation stopped.   . SunDown syndrome   . VT (ventricular tachycardia) ([place])     Past Surgical History:  Procedure Laterality Date  . CHOLECYSTECTOMY    . COLONOSCOPY  07/07/2011   Procedure: COLONOSCOPY;  Surgeon: Jerene Bears, MD;  Location: [facility] ENDOSCOPY;  Service: Gastroenterology;  Laterality: N/A;  . CORONARY ANGIOPLASTY WITH STENT PLACEMENT  2004   Tandem Cypher stents LAD  . CORONARY ARTERY BYPASS GRAFT  1983   SVG-mLAD  . ESOPHAGOGASTRODUODENOSCOPY  02/11/2011   Procedure: ESOPHAGOGASTRODUODENOSCOPY (EGD);  Surgeon: Beryle Beams, MD;  Location: [facility];  Service: Endoscopy;  Laterality: N/A;  . FLEXIBLE SIGMOIDOSCOPY N/A 02/08/2017   Procedure: FLEXIBLE SIGMOIDOSCOPY;  Surgeon:  Irene Shipper, MD;  Location: [facility];  Service: Endoscopy;  Laterality: N/A;  . ICD GENERATOR CHANGEOUT N/A 04/08/2016   Procedure: ICD Generator Changeout;  Surgeon: Evans Lance, MD;  Location: [place] CV LAB;  Service: Cardiovascular;  Laterality: N/A;  . ICD LEAD REMOVAL N/A 03/02/2017   Procedure: ICD LEAD REMOVAL;  Surgeon: Evans Lance, MD;  Location: [place];  Service: Cardiovascular;  Laterality: N/A;  . IMPLANTABLE CARDIOVERTER DEFIBRILLATOR (ICD) GENERATOR CHANGE N/A 05/20/2011   Procedure: ICD GENERATOR CHANGE;  Surgeon: Evans Lance, MD;   Medtronic secure dual-chamber ICD serial number [id number]   . IR GASTROSTOMY TUBE MOD SED  04/19/2017  . KNEE ARTHROSCOPY     right; "just went in and scraped it"  . LEAD INSERTION N/A 03/02/2017   Procedure: LEAD INSERTION;  Surgeon: Evans Lance, MD;  Location: [place];  Service: Cardiovascular;  Laterality: N/A;  . MASS EXCISION Right 05/10/2013   Procedure: EXCISION MASS RIGHT THUMB;  Surgeon: Wynonia Sours, MD;  Location: [place];  Service: Orthopedics;  Laterality: Right;  . PROXIMAL INTERPHALANGEAL FUSION (PIP) Right 05/10/2013   Procedure: DEBRIDEMENT PROXIMAL INTERPHALANGEAL FUSION (PIP);  Surgeon: Wynonia Sours, MD;  Location: [place];  Service: Orthopedics;  Laterality: Right;  . TONSILLECTOMY          Social History   Socioeconomic History  . Marital status: Divorced    Spouse name: Not on file  . Number of children: 2  . Years of education: Not on file  . Highest education level: Not on file  Occupational History  . Occupation: Sports coach: RETIRED  Social Needs  . Financial resource strain: Not on file  . Food insecurity:    Worry: Not on file    Inability: Not on file  . Transportation needs:    Medical: Not on file    Non-medical: Not on file  Tobacco Use  . Smoking status: Former Smoker    Packs/day: 2.00    Years: 30.00    Pack years: 60.00    Types: Cigarettes    Last attempt to quit: 06/23/1976    Years since quitting: 40.9  . Smokeless tobacco: Never Used  Substance and Sexual Activity  . Alcohol use: No  . Drug use: No  . Sexual activity: Never  Lifestyle  . Physical activity:    Days per week: Not on file    Minutes per session: Not on file  . Stress: Not on file  Relationships  . Social connections:    Talks on phone: Not on file    Gets together: Not on file    Attends religious service: Not on file    Active member of club or organization: Not on file    Attends meetings of clubs or  organizations: Not on file    Relationship status: Not on file  . Intimate partner violence:    Fear of current or ex partner: Not on file    Emotionally abused: Not on file    Physically abused: Not on file    Forced sexual activity: Not on file  Other Topics Concern  . Not on file  Social History Narrative  . Not on file   Family History  Problem Relation Age of Onset  . Tuberculosis Mother   . Tuberculosis Father   . Heart disease Brother   . Diabetes Sister   . Diabetes Brother   . Clotting  disorder Brother       VITAL SIGNS BP 106/67   Pulse 88   Temp (!) 96.6 F (35.9 C)   Resp 18   Ht '6\' 1"'  (1.854 m)   Wt 159 lb (72.1 kg)   SpO2 94%   BMI 20.98 kg/m   Outpatient Encounter Medications as of 05/24/2017  Medication Sig  . acetaminophen (TYLENOL) 650 MG suppository Place 1 suppository (650 mg total) rectally every 6 (six) hours as needed for fever or mild pain (T over 101).  . ADVAIR DISKUS 250-50 MCG/DOSE AEPB Inhale 1 puff into the lungs 2 (two) times daily.   . Artificial Saliva (BIOTENE MOISTURIZING MOUTH) SOLN Give 1 swab by mouth every 4 hours while awake  . digoxin (LANOXIN) 0.05 MG/ML solution Place 2.5 mLs (0.125 mg total) into feeding tube daily.  . fluticasone (FLONASE) 50 MCG/ACT nasal spray Place 1 spray into both nostrils daily as needed for allergies.  . furosemide (LASIX) 10 MG/ML solution Take 20 mg by mouth daily.  Marland Kitchen gabapentin (NEURONTIN) 100 MG capsule Give 1 Capsule (169m) to 3 capsules (3052m enterally at bedtime as needed  . ipratropium-albuterol (DUONEB) 0.5-2.5 (3) MG/3ML SOLN Take 3 mLs by nebulization every 4 (four) hours as needed.  . Lisinopril 1 MG/ML SOLN Place 2.5 mg into feeding tube daily.  . Marland Kitchenoratadine (CLARITIN) 10 MG tablet Take 1 tablet (10 mg total) by mouth daily as needed for allergies.  . Marland Kitchenenthol-cetylpyridinium (CEPACOL) 3 MG lozenge Take 1 lozenge by mouth every 2 (two) hours as needed for sore throat.  . metoprolol  tartrate (LOPRESSOR) 25 mg/10 mL SUSP Place 10 mLs (25 mg total) into feeding tube 2 (two) times daily.  . nitroGLYCERIN (NITROSTAT) 0.4 MG SL tablet Place 1 tablet (0.4 mg total) under the tongue every 5 (five) minutes as needed for chest pain.  . Nutritional Supplements (FEEDING SUPPLEMENT, JEVITY 1.5 CAL,) LIQD Jevity 1.5 -  Give 240cc every 4 hours  . Nutritional Supplements (NUTRITIONAL SUPPLEMENT PO) NPO - nothing by mouth diet, Nothing by mouth textrue  . OLANZapine zydis (ZYPREXA) 5 MG disintegrating tablet Take 1 tablet (5 mg total) by mouth at bedtime.  . ondansetron (ZOFRAN) 4 MG tablet Place 4 mg into feeding tube every 6 (six) hours as needed for nausea or vomiting.  . pantoprazole sodium (PROTONIX) 40 mg/20 mL PACK Place 20 mLs (40 mg total) into feeding tube daily.  . polyethylene glycol (MIRALAX / GLYCOLAX) packet Place 17 g into feeding tube daily.  . potassium chloride 20 MEQ/15ML (10%) SOLN Place 7.5 mLs (10 mEq total) into feeding tube daily.  . RESTASIS 0.05 % ophthalmic emulsion INSTILL 1 DROP INTO BOTH EYES TWICE A DAY  . tiotropium (SPIRIVA) 18 MCG inhalation capsule Place 18 mcg into inhaler and inhale daily.  . traMADol (ULTRAM) 5 mg/mL SUSP Place 10-20 mLs (50-100 mg total) into feeding tube every 6 (six) hours as needed for moderate pain.  . Water For Irrigation, Sterile (FREE WATER) SOLN Place into feeding tube every 4 (four) hours. 150 ml H2o flushes via tube every 4 hours  . ipratropium-albuterol (DUONEB) 0.5-2.5 (3) MG/3ML SOLN Take 3 mLs by nebulization every 6 (six) hours. X 2 weeks  . [DISCONTINUED] sennosides (SENOKOT) 8.8 MG/5ML syrup Take 5 mLs by mouth 2 (two) times daily as needed for mild constipation.   No facility-administered encounter medications on file as of 05/24/2017.      SIGNIFICANT DIAGNOSTIC EXAMS   PREVIOUS:   03-26-17: ct of chest  abdomen and pelvis: 1. No acute/traumatic intrathoracic, abdominal, or pelvic pathology. 2. Nonocclusive  pulmonary embolus in the subsegmental right lower lobe pulmonary artery branches likely old PE or scarring. Nonocclusive acute PE is not entirely excluded. Clinical correlation is recommended. 3. Mild cardiomegaly with dilatation of the right atrium. 4. 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 Small right pleural effusion and stable right lung base round atelectasis/scarring. 5. Aortic Atherosclerosis  and Emphysema    04-07-17: chest x-ray: Suspected patchy infiltrate at the right base, short interval radiographic follow-up suggested to ensure clearing. Cardiomegaly.  04-09-17: chest x-ray: COPD, cardiomegaly, stable. Increasing right basilar airspace opacity and small right effusion concerning for pneumonia  04-10-17: swallow study: Recommend NPO with short term alternative nutrition and continued ST   04-19-17: PEG tube insertion   05-01-17: chest x-ray: mild right basilar infiltrate with trace right sided effusion  NO NEW EXAMS      LABS REVIEWED: PREVIOUS:   04-07-17: wbc 9.5; hgb 13.4; hct 41.9; mcv 94.2; plt 101; glucose 84; bun 18; creat 1.28 ;k+ 4.0; na++ 136; ca 8.2; total bili 2.5; albumin 3.2; rapid flu: +A; blood culture: no growth; urine culture: <10,000 colonies 04-09-17: wbc 9.0; hgb 11.5; hct 35.1; mcv 91.9; plt 120; glucose 88; bun 9; creat 0.86; k+ 3.5; na++ 133; ca 7.5; mag 1.7 04-13-17: wbc 9.4; hgb 13.8; hct 42.6; mcv 94.0; plt 217; glucose 115; bun 11; creat 1.04 ;k+ 3.3; na++144; ca 8.7 04-16-17: wbc 6.5; hgb 12.2; hct 38.5; mcv 95.3; plt 185; glucose 115; bun 11; creat 0.78; k+ 4.0; na++ 143; ca 8.9; mag 1.7  04-20-17:wbc 12.4; hgb 12.2; hct 38.0; mcv 93.6; plt 209; glucose 110; bun 18; creat 0.95; k+ 4.7; na++ 136; ca 8.4; mag 2.7;  04-22-17: mag 1.9 05-02-17: wbc 7.1; hgb 11.8; hct 34.0; mcv 87.6; plt 223; glucose 130; bun 22.2; creat 0.71; k+ 4.4; na++ 143; ca 9.1; alk phos 123; albumin 3.4; tsh 0.91   NO NEW LABS   Review of Systems  Reason  unable to perform ROS: poor historian   Constitutional: Negative for malaise/fatigue.  HENT:       Sore mouth   Respiratory: Negative for cough.   Cardiovascular: Negative for chest pain and leg swelling.  Gastrointestinal: Negative for abdominal pain.  Musculoskeletal: Negative for myalgias.  Skin: Negative.   Neurological: Negative for dizziness.  Psychiatric/Behavioral: The patient is not nervous/anxious.     Physical Exam  Constitutional: He appears well-developed and well-nourished. No distress.  HENT:  Has significant thrush on tongue and mouth   Neck: No thyromegaly present.  Cardiovascular: Normal rate and intact distal pulses.  Murmur heard. 1/6 Hear rate slightly irregular   Pulmonary/Chest: Effort normal. No respiratory distress.  Breath sounds diminished in bases   Abdominal: Soft. Bowel sounds are normal. He exhibits no distension. There is no tenderness.  Peg tube present without signs of infection present.   Musculoskeletal: Normal range of motion. He exhibits no edema.  Lymphadenopathy:    He has no cervical adenopathy.  Neurological: He is alert.  Skin: Skin is warm and dry. He is not diaphoretic.  Psychiatric: He has a normal mood and affect.     ASSESSMENT/ PLAN:  TODAY;  1. History of right lung PE; without change he is not a candidate for anticoagulation therapy due to history of falls and subdural hematoma.     2. Chronic systolic heart failure: EF 35-40%: is status post AICD placement:  is stable will  continue lasix 20 mg daily with k+ 10 meq daily  lopressor 25 mg twice daily and will monitor his status.   3. Atrial fibrillation with controlled ventricular response: has history of PE  heart rate stable: is not a candidate for anticoagulations due to history of subdural hematoma: will continue lopressor 25 mg twice daily and digoxin 0.125 mg daily   4. Coronary artery disease involving native coronary artery with angina pectoris: is status post  CABG/ stent placements: is stable will continue lopressor 25 mg twice daily and has prn ntg.   5. Oral thrush: is worse: will begin diflucan 200 mg daily for 14 days with nystatin 5 cc swish and swallow four times daily for 14 days.   PREVIOUS    6. Hypertensive heart disease with heart failure: stable b/p 106/67: will continue lopressor 25 mg twice daily and lisinopril 2.5 mg daily  7. COPD( emphysema): is stable: is now on 02 at 2L/[place] as needed to keep sats >91 %. Will continue advair 250/50 twice daily  spiriva 18 mcg daily duoneb every 6 every 4 hours as needed   8. Chronic allergic rhinitis: stable will continue claritin daily as needed and flonase daily as needed  9. Polyneuropathy : is stable has neurontin 300 mg nightly as needed  10. Gastroesophageal reflux disease without esophagitis: stable will continue protonix 40 mg daily    11. Chronic constipation: stable will not make changes will monitor   12.  Pharyngoesophageal dysphagia: has chronic aspiration: is currently NPO and is receiving jevity tube feeding. For his dry mouth will begin biotene every 4 hours while awake and will monitor his status.   13. CKD stage III: stable bun 22.2/creat 0.71  14. Acute confusion: due to acute metabolic encephalopathy is stable will continue zyprexa 5 mg nightly; will need to monitor his status; and should be able to wean this off slowly he remains confused   15.  Adult failure to thrive: weight is 159 pounds; albumin is 3.4; without change in status; will continue to monitor    MD is aware of resident's narcotic use and is in agreement with current plan of care. We will attempt to wean resident as apropriate   Ok Edwards NP [facility]  Contact [phone] Monday through Friday 8am- 5pm  After hours call [phone]

## 2017-05-25 ENCOUNTER — Other Ambulatory Visit: Payer: Self-pay | Admitting: *Deleted

## 2017-05-25 NOTE — Patient Outreach (Signed)
[facility]) Care Management  05/25/2017  [name] 1933-01-20 [id number]   CSW was able to make contact with [name], patient's attending nurse at [facility] [place], today to receive an update on patient's status.  [name] admits that patient is still somewhat confused, unable to articulate his medical needs or perform activities of daily living independently.  [name] went on to say that patient continues to work with therapies (occupational, physical and speech).  [name] reported that patient is still receiving oxygen and that they are monitoring his pulse ox while he is sleeping.  [name] indicated that patient continues to receive bolus tube feedings, every 4 hours, which will more than likely always be the case for patient.  Last, patient continues to receive antibiotics for Influenza Pneumonia, as well as Aspiration Pneumonia.   CSW inquired about patient's long-term care plan, understanding that patient has been receiving therapies for over a month and assuming that these services will be terminated soon. [name] was unable to provide this information, admitting that they have been trying to get in contact with patient's son, [name] to discuss patient's plan of care, without success. CSW explained to [name] that CSW has also been trying to make contact with [name], but has been unsuccessful. CSW is unable to leave a HIPAA compliant message for [name] because his mail box has not been set up.  CSW will continue to try and outreach to [name] to discuss patient's discharge plans, as CSW does not feel that patient is stable enough to care for himself independently in the home.  CSW will follow-up with patient again in one week, as patient does not currently have a tentative discharge plan in place. [name], BSW, MSW, LCSW  Licensed Education officer, [facility] [employer] [facility]  Mailing [place] [address]  [address] [place], [place] [address] Physical Address-[address], [place], [place] [address] Toll Free Main # [phone] Fax # [phone] Cell # [phone]  Office # [phone] [name]

## 2017-05-26 NOTE — Telephone Encounter (Signed)
Tried contacting patient several times Mailing letter today to pt needing to contact of regarding results as soon as possible Nothing further needed at this time

## 2017-05-30 ENCOUNTER — Encounter: Payer: Self-pay | Admitting: Adult Health

## 2017-05-30 ENCOUNTER — Non-Acute Institutional Stay (SKILLED_NURSING_FACILITY): Payer: Medicare Other | Admitting: Adult Health

## 2017-05-30 DIAGNOSIS — J449 Chronic obstructive pulmonary disease, unspecified: Secondary | ICD-10-CM | POA: Diagnosis not present

## 2017-05-30 DIAGNOSIS — I110 Hypertensive heart disease with heart failure: Secondary | ICD-10-CM

## 2017-05-30 DIAGNOSIS — J309 Allergic rhinitis, unspecified: Secondary | ICD-10-CM

## 2017-05-30 DIAGNOSIS — G629 Polyneuropathy, unspecified: Secondary | ICD-10-CM

## 2017-05-30 NOTE — Progress Notes (Signed)
Location:   [facility] Room Number: 121 A Place of Service:  SNF (31)   CODE STATUS: Full Code  Allergies  Allergen Reactions  . Tamsulosin Other (See Comments)    Dizziness, Made BP very low and weakness  . Celebrex [Celecoxib] Hives and Nausea And Vomiting    Gi upset  . Dronedarone Nausea And Vomiting and Other (See Comments)    GI upset, abdominal pain  . Esomeprazole Magnesium Hives and Other (See Comments)    "don't really remember"  . Digoxin Diarrhea    May have caused some diarrhea  . Hydrocodone-Acetaminophen Other (See Comments)    Bad headache  . Protonix [Pantoprazole Sodium] Nausea And Vomiting and Other (See Comments)    Tolerates Dexilant    Chief Complaint  Patient presents with  . Medical Management of Chronic Issues    Hypertension; copd; rhinitis; polyneuropathy     HPI:  He is a long term resident of this facility being seen for the management of his chronic illnesses: hypertension; copd; rhinitis; polyneuropathy. He is a poor historian; he denies any cough or shortness of breath; denies any headaches. There are no nursing concerns at this time.   Past Medical History:  Diagnosis Date  . Abnormal thyroid scan    Abnormal thyroid imaging studies from 11/09/2010, status post ultrasound guided fine needle aspiration of the dominant left inferior thyroid nodule on 12/15/2010. Cytology report showed rare follicular epithelial cells and hemosiderin laden macrophages.  . Adenomatous colon polyp   . AICD (automatic cardioverter/defibrillator) present    a. fx lead; a. s/p lead extraction 03/02/17  . Arthritis    "all over"  . Asthma   . Atrial fibrillation ([place])    on chronic Coumadin; stopped July 2013 due to subdural hematomas  . CHF (congestive heart failure) (HCC)    EF 35-40% s/p most recent ICD generator change-out with Medtronic dual-chamber ICD 05/20/11 with explantation of previous abdominally-implanted device  . Coronary artery  disease    s/p CABG 1983 and PCI/stent 2004.   . Diabetes mellitus    diet controlled  . Diverticulosis   . Dyslipidemia   . Enteritis   . Erythrocytosis   . GERD (gastroesophageal reflux disease)   . Hepatic steatosis   . Hypertension   . Ischemic cardiomyopathy    WITH CHF  . Monocytosis 04/17/2013  . Myocardial infarction ([place]) 1983; ~ 1990  . Pleural effusion    right  . Pneumonia August 2013  . Portal hypertensive gastropathy ([place])   . Rectal ulcer   . Renal calculi   . Subdural hematoma [facility]) July 2013   Anticoagulation stopped.   . SunDown syndrome   . VT (ventricular tachycardia) ([place])     Past Surgical History:  Procedure Laterality Date  . CHOLECYSTECTOMY    . COLONOSCOPY  07/07/2011   Procedure: COLONOSCOPY;  Surgeon: Jerene Bears, MD;  Location: [facility];  Service: Gastroenterology;  Laterality: N/A;  . CORONARY ANGIOPLASTY WITH STENT PLACEMENT  2004   Tandem Cypher stents LAD  . CORONARY ARTERY BYPASS GRAFT  1983   SVG-mLAD  . ESOPHAGOGASTRODUODENOSCOPY  02/11/2011   Procedure: ESOPHAGOGASTRODUODENOSCOPY (EGD);  Surgeon: Beryle Beams, MD;  Location: [facility];  Service: Endoscopy;  Laterality: N/A;  . FLEXIBLE SIGMOIDOSCOPY N/A 02/08/2017   Procedure: FLEXIBLE SIGMOIDOSCOPY;  Surgeon: Irene Shipper, MD;  Location: [facility];  Service: Endoscopy;  Laterality: N/A;  . ICD GENERATOR CHANGEOUT N/A 04/08/2016   Procedure: ICD Generator Changeout;  Surgeon: Evans Lance, MD;  Location: [place] [facility];  Service: Cardiovascular;  Laterality: N/A;  . ICD LEAD REMOVAL N/A 03/02/2017   Procedure: ICD LEAD REMOVAL;  Surgeon: Evans Lance, MD;  Location: [place];  Service: Cardiovascular;  Laterality: N/A;  . IMPLANTABLE CARDIOVERTER DEFIBRILLATOR (ICD) GENERATOR CHANGE N/A 05/20/2011   Procedure: ICD GENERATOR CHANGE;  Surgeon: Evans Lance, MD;  Medtronic secure dual-chamber ICD serial number [id number]   . IR GASTROSTOMY TUBE MOD SED  04/19/2017  . KNEE  ARTHROSCOPY     right; "just went in and scraped it"  . LEAD INSERTION N/A 03/02/2017   Procedure: LEAD INSERTION;  Surgeon: Evans Lance, MD;  Location: [facility];  Service: Cardiovascular;  Laterality: N/A;  . MASS EXCISION Right 05/10/2013   Procedure: EXCISION MASS RIGHT THUMB;  Surgeon: Wynonia Sours, MD;  Location: [place];  Service: Orthopedics;  Laterality: Right;  . PROXIMAL INTERPHALANGEAL FUSION (PIP) Right 05/10/2013   Procedure: DEBRIDEMENT PROXIMAL INTERPHALANGEAL FUSION (PIP);  Surgeon: Wynonia Sours, MD;  Location: [place];  Service: Orthopedics;  Laterality: Right;  . TONSILLECTOMY          Social History   Socioeconomic History  . Marital status: Divorced    Spouse name: Not on file  . Number of children: 2  . Years of education: Not on file  . Highest education level: Not on file  Occupational History  . Occupation: Sports coach: RETIRED  Social Needs  . Financial resource strain: Not on file  . Food insecurity:    Worry: Not on file    Inability: Not on file  . Transportation needs:    Medical: Not on file    Non-medical: Not on file  Tobacco Use  . Smoking status: Former Smoker    Packs/day: 2.00    Years: 30.00    Pack years: 60.00    Types: Cigarettes    Last attempt to quit: 06/23/1976    Years since quitting: 40.9  . Smokeless tobacco: Never Used  Substance and Sexual Activity  . Alcohol use: No  . Drug use: No  . Sexual activity: Never  Lifestyle  . Physical activity:    Days per week: Not on file    Minutes per session: Not on file  . Stress: Not on file  Relationships  . Social connections:    Talks on phone: Not on file    Gets together: Not on file    Attends religious service: Not on file    Active member of club or organization: Not on file    Attends meetings of clubs or organizations: Not on file    Relationship status: Not on file  . Intimate partner violence:    Fear of current or ex  partner: Not on file    Emotionally abused: Not on file    Physically abused: Not on file    Forced sexual activity: Not on file  Other Topics Concern  . Not on file  Social History Narrative  . Not on file   Family History  Problem Relation Age of Onset  . Tuberculosis Mother   . Tuberculosis Father   . Heart disease Brother   . Diabetes Sister   . Diabetes Brother   . Clotting disorder Brother       VITAL SIGNS BP 128/80   Pulse 80   Temp 97.8 F (36.6 C)   Resp 18  Ht '6\' 1"'  (1.854 m)   Wt 159 lb (72.1 kg)   SpO2 98%   BMI 20.98 kg/m   Outpatient Encounter Medications as of 05/30/2017  Medication Sig  . acetaminophen (TYLENOL) 650 MG suppository Place 1 suppository (650 mg total) rectally every 6 (six) hours as needed for fever or mild pain (T over 101).  . ADVAIR DISKUS 250-50 MCG/DOSE AEPB Inhale 1 puff into the lungs 2 (two) times daily.   . Artificial Saliva (BIOTENE MOISTURIZING MOUTH) SOLN Give 1 swab by mouth every 4 hours while awake  . digoxin (LANOXIN) 0.05 MG/ML solution Place 2.5 mLs (0.125 mg total) into feeding tube daily.  . fluconazole (DIFLUCAN) 200 MG tablet Place 200 mg into feeding tube daily. X 14 days  . fluticasone (FLONASE) 50 MCG/ACT nasal spray Place 1 spray into both nostrils daily as needed for allergies.  . furosemide (LASIX) 10 MG/ML solution Take 20 mg by mouth daily.  Marland Kitchen gabapentin (NEURONTIN) 100 MG capsule Give 1 Capsule (143m) to 3 capsules (3021m enterally at bedtime as needed  . ipratropium-albuterol (DUONEB) 0.5-2.5 (3) MG/3ML SOLN Take 3 mLs by nebulization every 4 (four) hours as needed.  . Lisinopril 1 MG/ML SOLN Place 2.5 mg into feeding tube daily.  . Marland Kitchenoratadine (CLARITIN) 10 MG tablet Take 1 tablet (10 mg total) by mouth daily as needed for allergies.  . Marland Kitchenenthol-cetylpyridinium (CEPACOL) 3 MG lozenge Take 1 lozenge by mouth every 2 (two) hours as needed for sore throat.  . metoprolol tartrate (LOPRESSOR) 25 mg/10 mL SUSP  Place 10 mLs (25 mg total) into feeding tube 2 (two) times daily.  . nitroGLYCERIN (NITROSTAT) 0.4 MG SL tablet Place 1 tablet (0.4 mg total) under the tongue every 5 (five) minutes as needed for chest pain.  . Nutritional Supplements (FEEDING SUPPLEMENT, JEVITY 1.5 CAL,) LIQD Jevity 1.5 -  Give 240cc every 4 hours  . Nutritional Supplements (NUTRITIONAL SUPPLEMENT PO) NPO - nothing by mouth diet, Nothing by mouth textrue  . nystatin (MYCOSTATIN) 100000 UNIT/ML suspension Take 5 mLs by mouth 4 (four) times daily. X 10 days  . OLANZapine zydis (ZYPREXA) 5 MG disintegrating tablet Take 1 tablet (5 mg total) by mouth at bedtime.  . ondansetron (ZOFRAN) 4 MG tablet Place 4 mg into feeding tube every 6 (six) hours as needed for nausea or vomiting.  . pantoprazole sodium (PROTONIX) 40 mg/20 mL PACK Place 20 mLs (40 mg total) into feeding tube daily.  . polyethylene glycol (MIRALAX / GLYCOLAX) packet Place 17 g into feeding tube daily.  . potassium chloride 20 MEQ/15ML (10%) SOLN Place 7.5 mLs (10 mEq total) into feeding tube daily.  . RESTASIS 0.05 % ophthalmic emulsion INSTILL 1 DROP INTO BOTH EYES TWICE A DAY  . tiotropium (SPIRIVA) 18 MCG inhalation capsule Place 18 mcg into inhaler and inhale daily.  . traMADol (ULTRAM) 5 mg/mL SUSP Place 10-20 mLs (50-100 mg total) into feeding tube every 6 (six) hours as needed for moderate pain.  . Water For Irrigation, Sterile (FREE WATER) SOLN Place into feeding tube every 4 (four) hours. 150 ml H2o flushes via tube every 4 hours  . [DISCONTINUED] ipratropium-albuterol (DUONEB) 0.5-2.5 (3) MG/3ML SOLN Take 3 mLs by nebulization every 6 (six) hours. X 2 weeks   No facility-administered encounter medications on file as of 05/30/2017.      SIGNIFICANT DIAGNOSTIC EXAMS  PREVIOUS:   03-26-17: ct of chest abdomen and pelvis: 1. No acute/traumatic intrathoracic, abdominal, or pelvic pathology. 2. Nonocclusive pulmonary embolus in  the subsegmental right lower lobe  pulmonary artery branches likely old PE or scarring. Nonocclusive acute PE is not entirely excluded. Clinical correlation is recommended. 3. Mild cardiomegaly with dilatation of the right atrium. 4. 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 Small right pleural effusion and stable right lung base round atelectasis/scarring. 5. Aortic Atherosclerosis  and Emphysema    04-07-17: chest x-ray: Suspected patchy infiltrate at the right base, short interval radiographic follow-up suggested to ensure clearing. Cardiomegaly.  04-09-17: chest x-ray: COPD, cardiomegaly, stable. Increasing right basilar airspace opacity and small right effusion concerning for pneumonia  04-10-17: swallow study: Recommend NPO with short term alternative nutrition and continued ST   04-19-17: PEG tube insertion   05-01-17: chest x-ray: mild right basilar infiltrate with trace right sided effusion  NO NEW EXAMS      LABS REVIEWED: PREVIOUS:   04-07-17: wbc 9.5; hgb 13.4; hct 41.9; mcv 94.2; plt 101; glucose 84; bun 18; creat 1.28 ;k+ 4.0; na++ 136; ca 8.2; total bili 2.5; albumin 3.2; rapid flu: +A; blood culture: no growth; urine culture: <10,000 colonies 04-09-17: wbc 9.0; hgb 11.5; hct 35.1; mcv 91.9; plt 120; glucose 88; bun 9; creat 0.86; k+ 3.5; na++ 133; ca 7.5; mag 1.7 04-13-17: wbc 9.4; hgb 13.8; hct 42.6; mcv 94.0; plt 217; glucose 115; bun 11; creat 1.04 ;k+ 3.3; na++144; ca 8.7 04-16-17: wbc 6.5; hgb 12.2; hct 38.5; mcv 95.3; plt 185; glucose 115; bun 11; creat 0.78; k+ 4.0; na++ 143; ca 8.9; mag 1.7  04-20-17:wbc 12.4; hgb 12.2; hct 38.0; mcv 93.6; plt 209; glucose 110; bun 18; creat 0.95; k+ 4.7; na++ 136; ca 8.4; mag 2.7;  04-22-17: mag 1.9 05-02-17: wbc 7.1; hgb 11.8; hct 34.0; mcv 87.6; plt 223; glucose 130; bun 22.2; creat 0.71; k+ 4.4; na++ 143; ca 9.1; alk phos 123; albumin 3.4; tsh 0.91   NO NEW LABS   Review of Systems  Reason unable to perform ROS: poor historian   Constitutional:  Negative for malaise/fatigue.  Respiratory: Negative for cough.   Cardiovascular: Negative for chest pain.  Gastrointestinal: Negative for abdominal pain.  Musculoskeletal: Negative for back pain.  Skin: Negative.   Neurological: Negative for dizziness.  Psychiatric/Behavioral: The patient is not nervous/anxious.      Physical Exam  Constitutional: He appears well-developed and well-nourished. No distress.  Neck: No thyromegaly present.  Cardiovascular: Normal rate, regular rhythm and intact distal pulses.  Murmur heard. 1/6  Pulmonary/Chest: Effort normal and breath sounds normal. No respiratory distress.  Abdominal: Soft. Bowel sounds are normal. He exhibits no distension. There is no tenderness.  Peg tube present without signs of infection present   Musculoskeletal: Normal range of motion. He exhibits no edema.  Lymphadenopathy:    He has no cervical adenopathy.  Neurological: He is alert.  Skin: Skin is warm and dry. He is not diaphoretic.  Psychiatric: He has a normal mood and affect.    ASSESSMENT/ PLAN:  TODAY;  1. Hypertensive heart disease with heart failure: stable b/p 128/80: will continue lopressor 25 mg twice daily and lisinopril 2.5 mg daily  2. COPD( emphysema): is stable: is now on 02 at 2L/[place] as needed to keep sats >91 %. Will continue advair 250/50 twice daily  spiriva 18 mcg daily duoneb every 6 every 4 hours as needed   3. Chronic allergic rhinitis: stable will continue claritin daily as needed and flonase daily as needed  4. Polyneuropathy : is stable has neurontin 300 mg nightly as  needed   PREVIOUS    5. Gastroesophageal reflux disease without esophagitis: stable will continue protonix 40 mg daily    6. Chronic constipation: stable will not make changes will monitor   7.  Pharyngoesophageal dysphagia: has chronic aspiration: is currently NPO and is receiving jevity tube feeding. For his dry mouth will begin biotene every 4 hours while awake and  will monitor his status.   8. CKD stage III: stable bun 22.2/creat 0.71  9.. Acute confusion: due to acute metabolic encephalopathy is stable will continue zyprexa 5 mg nightly; will need to monitor his status; and should be able to wean this off slowly he remains confused   10.  Adult failure to thrive: weight is 159 pounds; albumin is 3.4; without change in status; will continue to monitor  11. History of right lung PE; without change he is not a candidate for anticoagulation therapy due to history of falls and subdural hematoma.     12. Chronic systolic heart failure: EF 35-40%: is status post AICD placement:  is stable will continue lasix 20 mg daily with k+ 10 meq daily  lopressor 25 mg twice daily and will monitor his status.   13. Atrial fibrillation with controlled ventricular response: has history of PE  heart rate stable: is not a candidate for anticoagulations due to history of subdural hematoma: will continue lopressor 25 mg twice daily and digoxin 0.125 mg daily   14. Coronary artery disease involving native coronary artery with angina pectoris: is status post CABG/ stent placements: is stable will continue lopressor 25 mg twice daily and has prn ntg.   15. Oral thrush: is improving will complete diflucan and nystatin      MD is aware of resident's narcotic use and is in agreement with current plan of care. We will attempt to wean resident as apropriate   Ok Edwards NP [facility]  Contact [phone] Monday through Friday 8am- 5pm  After hours call [phone]

## 2017-05-31 ENCOUNTER — Other Ambulatory Visit: Payer: Self-pay

## 2017-05-31 ENCOUNTER — Encounter: Payer: Medicare Other | Admitting: Internal Medicine

## 2017-05-31 MED ORDER — TRAMADOL 5 MG/ML ORAL SUSPENSION: 50.0000 mg | Freq: Four times a day (QID) | ORAL | 0 refills | Status: DC | PRN

## 2017-05-31 NOTE — Telephone Encounter (Signed)
Rx faxed to [facility] (p) [phone], (f) [phone]  

## 2017-06-01 ENCOUNTER — Non-Acute Institutional Stay (SKILLED_NURSING_FACILITY): Payer: Medicare Other | Admitting: Adult Health

## 2017-06-01 ENCOUNTER — Encounter: Payer: Self-pay | Admitting: Adult Health

## 2017-06-01 ENCOUNTER — Other Ambulatory Visit: Payer: Self-pay | Admitting: *Deleted

## 2017-06-01 DIAGNOSIS — I5022 Chronic systolic (congestive) heart failure: Secondary | ICD-10-CM | POA: Diagnosis not present

## 2017-06-01 DIAGNOSIS — J449 Chronic obstructive pulmonary disease, unspecified: Secondary | ICD-10-CM | POA: Diagnosis not present

## 2017-06-01 DIAGNOSIS — R1314 Dysphagia, pharyngoesophageal phase: Secondary | ICD-10-CM

## 2017-06-01 NOTE — Patient Outreach (Signed)
[facility]) Care Management  06/01/2017  [name] 1933/01/20 [id number]   CSW made an attempt to try and contact patient and patient's attending nurse, [name] at [facility] today to receive an update on patient, but neither were available at the time of CSW's call.  Patient is residing at [facility] to receive rehabilitative services, as well as tube feeds and intravenous antibiotics.  CSW left a HIPAA compliant message for [name] and is currently awaiting a return call.  CSW also requested that [name] have patient contact CSW, as well.  CSW will make a second outreach attempt on Monday, Jun 05, 2017, if a return call is not received from patient and/or [name], in the meantime. [name], BSW, MSW, LCSW  Licensed Education officer, [facility]  Mailing [place] [address] [place], [place] [address] Physical Address-[address], [place], [place] [address] Toll Free Main # [phone] Fax # [phone] Cell # [phone]  Office # [phone] [name]

## 2017-06-01 NOTE — Progress Notes (Signed)
Location:   [facility] Room Number: 121 A Place of Service:  SNF (31)   CODE STATUS: Full Code  Allergies  Allergen Reactions  . Tamsulosin Other (See Comments)    Dizziness, Made BP very low and weakness  . Celebrex [Celecoxib] Hives and Nausea And Vomiting    Gi upset  . Dronedarone Nausea And Vomiting and Other (See Comments)    GI upset, abdominal pain  . Esomeprazole Magnesium Hives and Other (See Comments)    "don't really remember"  . Digoxin Diarrhea    May have caused some diarrhea  . Hydrocodone-Acetaminophen Other (See Comments)    Bad headache  . Protonix [Pantoprazole Sodium] Nausea And Vomiting and Other (See Comments)    Tolerates Dexilant    Chief Complaint  Patient presents with  . Acute Visit    Care Plan meeting    HPI:  We have come together for his care plan meeting. His son is present via conference call. His appetite is poor; he continues to require tube feeding. We have discussed appetite stimulant; he cannot take remeron as this medication cannot be crushed. He produces a large amount of mucus with taking po. Will start him on atrovent nasal spray. We have discussed his goals of care; which is for him to go to assisted living; if he can get off the tube feeding. He has passed his fees test.   Past Medical History:  Diagnosis Date  . Abnormal thyroid scan    Abnormal thyroid imaging studies from 11/09/2010, status post ultrasound guided fine needle aspiration of the dominant left inferior thyroid nodule on 12/15/2010. Cytology report showed rare follicular epithelial cells and hemosiderin laden macrophages.  . Adenomatous colon polyp   . AICD (automatic cardioverter/defibrillator) present    a. fx lead; a. s/p lead extraction 03/02/17  . Arthritis    "all over"  . Asthma   . Atrial fibrillation ([place])    on chronic Coumadin; stopped July 2013 due to subdural hematomas  . CHF (congestive heart failure) (HCC)    EF 35-40% s/p  most recent ICD generator change-out with Medtronic dual-chamber ICD 05/20/11 with explantation of previous abdominally-implanted device  . Coronary artery disease    s/p CABG 1983 and PCI/stent 2004.   . Diabetes mellitus    diet controlled  . Diverticulosis   . Dyslipidemia   . Enteritis   . Erythrocytosis   . GERD (gastroesophageal reflux disease)   . Hepatic steatosis   . Hypertension   . Ischemic cardiomyopathy    WITH CHF  . Monocytosis 04/17/2013  . Myocardial infarction ([place]) 1983; ~ 1990  . Pleural effusion    right  . Pneumonia August 2013  . Portal hypertensive gastropathy ([place])   . Rectal ulcer   . Renal calculi   . Subdural hematoma [facility]) July 2013   Anticoagulation stopped.   . SunDown syndrome   . VT (ventricular tachycardia) ([place])     Past Surgical History:  Procedure Laterality Date  . CHOLECYSTECTOMY    . COLONOSCOPY  07/07/2011   Procedure: COLONOSCOPY;  Surgeon: Jerene Bears, MD;  Location: [facility];  Service: Gastroenterology;  Laterality: N/A;  . CORONARY ANGIOPLASTY WITH STENT PLACEMENT  2004   Tandem Cypher stents LAD  . CORONARY ARTERY BYPASS GRAFT  1983   SVG-mLAD  . ESOPHAGOGASTRODUODENOSCOPY  02/11/2011   Procedure: ESOPHAGOGASTRODUODENOSCOPY (EGD);  Surgeon: Beryle Beams, MD;  Location: [facility];  Service: Endoscopy;  Laterality: N/A;  . FLEXIBLE  SIGMOIDOSCOPY N/A 02/08/2017   Procedure: FLEXIBLE SIGMOIDOSCOPY;  Surgeon: Irene Shipper, MD;  Location: [facility];  Service: Endoscopy;  Laterality: N/A;  . ICD GENERATOR CHANGEOUT N/A 04/08/2016   Procedure: ICD Generator Changeout;  Surgeon: Evans Lance, MD;  Location: [place] CV LAB;  Service: Cardiovascular;  Laterality: N/A;  . ICD LEAD REMOVAL N/A 03/02/2017   Procedure: ICD LEAD REMOVAL;  Surgeon: Evans Lance, MD;  Location: [place];  Service: Cardiovascular;  Laterality: N/A;  . IMPLANTABLE CARDIOVERTER DEFIBRILLATOR (ICD) GENERATOR CHANGE N/A 05/20/2011   Procedure: ICD  GENERATOR CHANGE;  Surgeon: Evans Lance, MD;  Medtronic secure dual-chamber ICD serial number [id number]   . IR GASTROSTOMY TUBE MOD SED  04/19/2017  . KNEE ARTHROSCOPY     right; "just went in and scraped it"  . LEAD INSERTION N/A 03/02/2017   Procedure: LEAD INSERTION;  Surgeon: Evans Lance, MD;  Location: [place];  Service: Cardiovascular;  Laterality: N/A;  . MASS EXCISION Right 05/10/2013   Procedure: EXCISION MASS RIGHT THUMB;  Surgeon: Wynonia Sours, MD;  Location: [place];  Service: Orthopedics;  Laterality: Right;  . PROXIMAL INTERPHALANGEAL FUSION (PIP) Right 05/10/2013   Procedure: DEBRIDEMENT PROXIMAL INTERPHALANGEAL FUSION (PIP);  Surgeon: Wynonia Sours, MD;  Location: [place];  Service: Orthopedics;  Laterality: Right;  . TONSILLECTOMY          Social History   Socioeconomic History  . Marital status: Divorced    Spouse name: Not on file  . Number of children: 2  . Years of education: Not on file  . Highest education level: Not on file  Occupational History  . Occupation: Sports coach: RETIRED  Social Needs  . Financial resource strain: Not on file  . Food insecurity:    Worry: Not on file    Inability: Not on file  . Transportation needs:    Medical: Not on file    Non-medical: Not on file  Tobacco Use  . Smoking status: Former Smoker    Packs/day: 2.00    Years: 30.00    Pack years: 60.00    Types: Cigarettes    Last attempt to quit: 06/23/1976    Years since quitting: 40.9  . Smokeless tobacco: Never Used  Substance and Sexual Activity  . Alcohol use: No  . Drug use: No  . Sexual activity: Never  Lifestyle  . Physical activity:    Days per week: Not on file    Minutes per session: Not on file  . Stress: Not on file  Relationships  . Social connections:    Talks on phone: Not on file    Gets together: Not on file    Attends religious service: Not on file    Active member of club or organization: Not  on file    Attends meetings of clubs or organizations: Not on file    Relationship status: Not on file  . Intimate partner violence:    Fear of current or ex partner: Not on file    Emotionally abused: Not on file    Physically abused: Not on file    Forced sexual activity: Not on file  Other Topics Concern  . Not on file  Social History Narrative  . Not on file   Family History  Problem Relation Age of Onset  . Tuberculosis Mother   . Tuberculosis Father   . Heart disease Brother   . Diabetes  Sister   . Diabetes Brother   . Clotting disorder Brother       VITAL SIGNS BP (!) 106/58   Pulse 74   Temp (!) 96.8 F (36 C)   Resp 18   Ht _0  (1.854 m)   Wt 159 lb (72.1 kg)   SpO2 97%   BMI 20.98 kg/m   Outpatient Encounter Medications as of 06/01/2017  Medication Sig  . acetaminophen (TYLENOL) 650 MG suppository Place 1 suppository (650 mg total) rectally every 6 (six) hours as needed for fever or mild pain (T over 101).  . ADVAIR DISKUS 250-50 MCG/DOSE AEPB Inhale 1 puff into the lungs 2 (two) times daily.   . Artificial Saliva (BIOTENE MOISTURIZING MOUTH) SOLN Give 1 swab by mouth every 4 hours while awake  . digoxin (LANOXIN) 0.05 MG/ML solution Place 2.5 mLs (0.125 mg total) into feeding tube daily.  . fluconazole (DIFLUCAN) 200 MG tablet Place 200 mg into feeding tube daily. X 14 days  . fluticasone (FLONASE) 50 MCG/ACT nasal spray Place 1 spray into both nostrils daily as needed for allergies.  . furosemide (LASIX) 10 MG/ML solution Take 20 mg by mouth daily.  Marland Kitchen gabapentin (NEURONTIN) 100 MG capsule Give 1 Capsule (167m) to 3 capsules (3021m enterally at bedtime as needed  . ipratropium-albuterol (DUONEB) 0.5-2.5 (3) MG/3ML SOLN Take 3 mLs by nebulization every 4 (four) hours as needed.  . Lisinopril 1 MG/ML SOLN Place 2.5 mg into feeding tube daily.  . Marland Kitchenoratadine (CLARITIN) 10 MG tablet Take 1 tablet (10 mg total) by mouth daily as needed for allergies.  . Marland Kitchenmenthol-cetylpyridinium (CEPACOL) 3 MG lozenge Take 1 lozenge by mouth every 2 (two) hours as needed for sore throat.  . metoprolol tartrate (LOPRESSOR) 25 mg/10 mL SUSP Place 10 mLs (25 mg total) into feeding tube 2 (two) times daily.  . nitroGLYCERIN (NITROSTAT) 0.4 MG SL tablet Place 1 tablet (0.4 mg total) under the tongue every 5 (five) minutes as needed for chest pain.  . Nutritional Supplements (FEEDING SUPPLEMENT, JEVITY 1.5 CAL,) LIQD Jevity 1.5 -  Give 240cc every 4 hours  . Nutritional Supplements (NUTRITIONAL SUPPLEMENT PO) NPO - nothing by mouth diet, Nothing by mouth textrue  . nystatin (MYCOSTATIN) 100000 UNIT/ML suspension Take 5 mLs by mouth 4 (four) times daily. X 10 days  . OLANZapine zydis (ZYPREXA) 5 MG disintegrating tablet Take 1 tablet (5 mg total) by mouth at bedtime.  . ondansetron (ZOFRAN) 4 MG tablet Place 4 mg into feeding tube every 6 (six) hours as needed for nausea or vomiting.  . pantoprazole sodium (PROTONIX) 40 mg/20 mL PACK Place 20 mLs (40 mg total) into feeding tube daily.  . polyethylene glycol (MIRALAX / GLYCOLAX) packet Place 17 g into feeding tube daily.  . potassium chloride 20 MEQ/15ML (10%) SOLN Place 7.5 mLs (10 mEq total) into feeding tube daily.  . RESTASIS 0.05 % ophthalmic emulsion INSTILL 1 DROP INTO BOTH EYES TWICE A DAY  . tiotropium (SPIRIVA) 18 MCG inhalation capsule Place 18 mcg into inhaler and inhale daily.  . traMADol (ULTRAM) 5 mg/mL SUSP Place 10-20 mLs (50-100 mg total) into feeding tube every 6 (six) hours as needed for moderate pain.  . Water For Irrigation, Sterile (FREE WATER) SOLN Place into feeding tube every 4 (four) hours. 150 ml H2o flushes via tube every 4 hours   No facility-administered encounter medications on file as of 06/01/2017.      SIGNIFICANT DIAGNOSTIC EXAMS   PREVIOUS:  03-26-17: ct of chest abdomen and pelvis: 1. No acute/traumatic intrathoracic, abdominal, or pelvic pathology. 2. Nonocclusive pulmonary  embolus in the subsegmental right lower lobe pulmonary artery branches likely old PE or scarring. Nonocclusive acute PE is not entirely excluded. Clinical correlation is recommended. 3. Mild cardiomegaly with dilatation of the right atrium. 4. 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 Small right pleural effusion and stable right lung base round atelectasis/scarring. 5. Aortic Atherosclerosis  and Emphysema    04-07-17: chest x-ray: Suspected patchy infiltrate at the right base, short interval radiographic follow-up suggested to ensure clearing. Cardiomegaly.  04-09-17: chest x-ray: COPD, cardiomegaly, stable. Increasing right basilar airspace opacity and small right effusion concerning for pneumonia  04-10-17: swallow study: Recommend NPO with short term alternative nutrition and continued ST   04-19-17: PEG tube insertion   05-01-17: chest x-ray: mild right basilar infiltrate with trace right sided effusion  NO NEW EXAMS      LABS REVIEWED: PREVIOUS:   04-07-17: wbc 9.5; hgb 13.4; hct 41.9; mcv 94.2; plt 101; glucose 84; bun 18; creat 1.28 ;k+ 4.0; na++ 136; ca 8.2; total bili 2.5; albumin 3.2; rapid flu: +A; blood culture: no growth; urine culture: <10,000 colonies 04-09-17: wbc 9.0; hgb 11.5; hct 35.1; mcv 91.9; plt 120; glucose 88; bun 9; creat 0.86; k+ 3.5; na++ 133; ca 7.5; mag 1.7 04-13-17: wbc 9.4; hgb 13.8; hct 42.6; mcv 94.0; plt 217; glucose 115; bun 11; creat 1.04 ;k+ 3.3; na++144; ca 8.7 04-16-17: wbc 6.5; hgb 12.2; hct 38.5; mcv 95.3; plt 185; glucose 115; bun 11; creat 0.78; k+ 4.0; na++ 143; ca 8.9; mag 1.7  04-20-17:wbc 12.4; hgb 12.2; hct 38.0; mcv 93.6; plt 209; glucose 110; bun 18; creat 0.95; k+ 4.7; na++ 136; ca 8.4; mag 2.7;  04-22-17: mag 1.9 05-02-17: wbc 7.1; hgb 11.8; hct 34.0; mcv 87.6; plt 223; glucose 130; bun 22.2; creat 0.71; k+ 4.4; na++ 143; ca 9.1; alk phos 123; albumin 3.4; tsh 0.91   NO NEW LABS   Review of Systems  Reason unable to  perform ROS: poor historian   Constitutional: Negative for malaise/fatigue.  Respiratory: Positive for cough.        When he eats   Cardiovascular: Negative for chest pain.  Gastrointestinal: Negative for abdominal pain.  Musculoskeletal: Negative for back pain.  Skin: Negative.   Neurological: Negative for dizziness.  Psychiatric/Behavioral: The patient is not nervous/anxious.      Physical Exam  Constitutional: He appears well-developed and well-nourished. No distress.  Neck: No thyromegaly present.  Cardiovascular: Normal rate, regular rhythm and intact distal pulses.  Murmur heard. 1/6  Pulmonary/Chest: Effort normal and breath sounds normal. No respiratory distress.  Abdominal: Soft. Bowel sounds are normal. He exhibits no distension. There is no tenderness.  Peg tube present without signs of infection present   Musculoskeletal: Normal range of motion. He exhibits no edema.  Lymphadenopathy:    He has no cervical adenopathy.  Neurological: He is alert.  Skin: Skin is warm and dry. He is not diaphoretic.  Psychiatric: He has a normal mood and affect.    ASSESSMENT/ PLAN:  TODAY:  1. Chronic systolic heart failure 2. COPD 3. Pharyngoesophageal dysphagia  His goal is for assisted living placement Will begin atrovent nasal spray before meals Will continue therapy as directed  Time spent with patient and family 30 minutes: discussed medications; medical status; therapy goals and goals of care; tube feeding needs. Verbalized understanding.   MD is aware of  resident's narcotic use and is in agreement with current plan of care. We will attempt to wean resident as apropriate   Ok Edwards NP [facility]  Contact [phone] Monday through Friday 8am- 5pm  After hours call [phone]

## 2017-06-02 ENCOUNTER — Telehealth: Payer: Self-pay | Admitting: Pulmonary Disease

## 2017-06-02 NOTE — Telephone Encounter (Signed)
Spoke with [name] and advised that [name] with [facility] will handle this. Judeen Hammans spoke with her and she will take the order and assist pt with ONO. Nothing further is needed.

## 2017-06-03 DIAGNOSIS — B370 Candidal stomatitis: Secondary | ICD-10-CM | POA: Insufficient documentation

## 2017-06-05 ENCOUNTER — Other Ambulatory Visit: Payer: Self-pay | Admitting: *Deleted

## 2017-06-05 NOTE — Patient Outreach (Signed)
[facility]) Care Management  06/05/2017  DAYTON KENLEY 29-Apr-1932 [id number]   CSW was able to meet with patient today at [facility] [place], [place] where patient currently resides to receive rehabilitative services.  Patient was sitting in his room watching television at the time of CSW's arrival.  Patient reports that he is now able to eat some food but that his diet is being supplemented by bolus tube feeds.  Patient is no longer receiving intravenous antibiotics.  Patient continues to work well with therapies, according to patient's attending Nurse at [facility] [place], [name].  [name] indicated that patient still does not have a tentative discharge date but that patient is much improved.  CSW will continue to follow along to determine whether patient will go home with home health services versus remaining at [facility] [place] for long-term care services.  CSW will contact patient at [facility] on Monday, Jun 12, 2017 to assess needs. [name], BSW, MSW, LCSW  Licensed Education officer, environmental Health System  Mailing [place] [address] [place], [place] [address] Physical Address-[address], [place], [place] [address] Toll Free Main # [phone] Fax # [phone] Cell # [phone]  Office # [phone] [email or web address]

## 2017-06-06 ENCOUNTER — Encounter: Payer: Self-pay | Admitting: *Deleted

## 2017-06-06 NOTE — Patient Outreach (Signed)
[facility]) Care Management  06/06/2017  Madhav Mohon [employer] 1932-02-23 [id number]   Erroneous Encounter.    [name], BSW, MSW, LCSW  Licensed Education officer, environmental Health System  Mailing [place] [address] [place], [place] [address] Physical Address-[address], [place], [place] [address] Toll Free Main # [phone] Fax # [phone] Cell # [phone]  Office # [phone] [email or web address]

## 2017-06-08 ENCOUNTER — Encounter: Payer: Self-pay | Admitting: Adult Health

## 2017-06-08 ENCOUNTER — Non-Acute Institutional Stay (SKILLED_NURSING_FACILITY): Payer: Medicare Other | Admitting: Adult Health

## 2017-06-08 DIAGNOSIS — B370 Candidal stomatitis: Secondary | ICD-10-CM

## 2017-06-08 DIAGNOSIS — G3184 Mild cognitive impairment, so stated: Secondary | ICD-10-CM | POA: Diagnosis not present

## 2017-06-08 NOTE — Progress Notes (Signed)
Location:   [facility] Room Number: 121 A Place of Service:  SNF (31)   CODE STATUS: Full Code  Allergies  Allergen Reactions  . Tamsulosin Other (See Comments)    Dizziness, Made BP very low and weakness  . Celebrex [Celecoxib] Hives and Nausea And Vomiting    Gi upset  . Dronedarone Nausea And Vomiting and Other (See Comments)    GI upset, abdominal pain  . Esomeprazole Magnesium Hives and Other (See Comments)    "don't really remember"  . Digoxin Diarrhea    May have caused some diarrhea  . Hydrocodone-Acetaminophen Other (See Comments)    Bad headache  . Protonix [Pantoprazole Sodium] Nausea And Vomiting and Other (See Comments)    Tolerates Dexilant    Chief Complaint  Patient presents with  . Acute Visit    Thrush    HPI:  Speech therapy reports that he is having increased difficulty with swallowing. He is unable to get his food past his mouth; he is spitting his food back up and does produce a great deal of sputum as well. He does have significant thrush present. There are no reports of fevers present; on difficulty tolerating peg tube feedings.   Past Medical History:  Diagnosis Date  . Abnormal thyroid scan    Abnormal thyroid imaging studies from 11/09/2010, status post ultrasound guided fine needle aspiration of the dominant left inferior thyroid nodule on 12/15/2010. Cytology report showed rare follicular epithelial cells and hemosiderin laden macrophages.  . Adenomatous colon polyp   . AICD (automatic cardioverter/defibrillator) present    a. fx lead; a. s/p lead extraction 03/02/17  . Arthritis    "all over"  . Asthma   . Atrial fibrillation ([place])    on chronic Coumadin; stopped July 2013 due to subdural hematomas  . CHF (congestive heart failure) (HCC)    EF 35-40% s/p most recent ICD generator change-out with Medtronic dual-chamber ICD 05/20/11 with explantation of previous abdominally-implanted device  . Coronary artery disease    s/p CABG 1983 and PCI/stent 2004.   . Diabetes mellitus    diet controlled  . Diverticulosis   . Dyslipidemia   . Enteritis   . Erythrocytosis   . GERD (gastroesophageal reflux disease)   . Hepatic steatosis   . Hypertension   . Ischemic cardiomyopathy    WITH CHF  . Monocytosis 04/17/2013  . Myocardial infarction ([place]) 1983; ~ 1990  . Pleural effusion    right  . Pneumonia August 2013  . Portal hypertensive gastropathy ([place])   . Rectal ulcer   . Renal calculi   . Subdural hematoma [facility]) July 2013   Anticoagulation stopped.   . SunDown syndrome   . VT (ventricular tachycardia) ([place])     Past Surgical History:  Procedure Laterality Date  . CHOLECYSTECTOMY    . COLONOSCOPY  07/07/2011   Procedure: COLONOSCOPY;  Surgeon: Jerene Bears, MD;  Location: [facility];  Service: Gastroenterology;  Laterality: N/A;  . CORONARY ANGIOPLASTY WITH STENT PLACEMENT  2004   Tandem Cypher stents LAD  . CORONARY ARTERY BYPASS GRAFT  1983   SVG-mLAD  . ESOPHAGOGASTRODUODENOSCOPY  02/11/2011   Procedure: ESOPHAGOGASTRODUODENOSCOPY (EGD);  Surgeon: Beryle Beams, MD;  Location: [facility];  Service: Endoscopy;  Laterality: N/A;  . FLEXIBLE SIGMOIDOSCOPY N/A 02/08/2017   Procedure: FLEXIBLE SIGMOIDOSCOPY;  Surgeon: Irene Shipper, MD;  Location: [facility];  Service: Endoscopy;  Laterality: N/A;  . [place] N/A 04/08/2016   Procedure:  ICD Generator Changeout;  Surgeon: Evans Lance, MD;  Location: [facility];  Service: Cardiovascular;  Laterality: N/A;  . ICD LEAD REMOVAL N/A 03/02/2017   Procedure: ICD LEAD REMOVAL;  Surgeon: Evans Lance, MD;  Location: [facility] [employer];  Service: Cardiovascular;  Laterality: N/A;  . IMPLANTABLE CARDIOVERTER DEFIBRILLATOR (ICD) GENERATOR CHANGE N/A 05/20/2011   Procedure: ICD GENERATOR CHANGE;  Surgeon: Evans Lance, MD;  Medtronic secure dual-chamber ICD serial number [id number]   . IR GASTROSTOMY TUBE MOD SED  04/19/2017  . KNEE ARTHROSCOPY       right; "just went in and scraped it"  . LEAD INSERTION N/A 03/02/2017   Procedure: LEAD INSERTION;  Surgeon: Evans Lance, MD;  Location: [facility];  Service: Cardiovascular;  Laterality: N/A;  . MASS EXCISION Right 05/10/2013   Procedure: EXCISION MASS RIGHT THUMB;  Surgeon: Wynonia Sours, MD;  Location: [facility];  Service: Orthopedics;  Laterality: Right;  . PROXIMAL INTERPHALANGEAL FUSION (PIP) Right 05/10/2013   Procedure: DEBRIDEMENT PROXIMAL INTERPHALANGEAL FUSION (PIP);  Surgeon: Wynonia Sours, MD;  Location: [facility];  Service: Orthopedics;  Laterality: Right;  . TONSILLECTOMY          Social History   Socioeconomic History  . Marital status: Divorced    Spouse name: Not on file  . Number of children: 2  . Years of education: Not on file  . Highest education level: Not on file  Occupational History  . Occupation: Sports coach: RETIRED  Social Needs  . Financial resource strain: Not on file  . Food insecurity:    Worry: Not on file    Inability: Not on file  . Transportation needs:    Medical: Not on file    Non-medical: Not on file  Tobacco Use  . Smoking status: Former Smoker    Packs/day: 2.00    Years: 30.00    Pack years: 60.00    Types: Cigarettes    Last attempt to quit: 06/23/1976    Years since quitting: 40.9  . Smokeless tobacco: Never Used  Substance and Sexual Activity  . Alcohol use: No  . Drug use: No  . Sexual activity: Never  Lifestyle  . Physical activity:    Days per week: Not on file    Minutes per session: Not on file  . Stress: Not on file  Relationships  . Social connections:    Talks on phone: Not on file    Gets together: Not on file    Attends religious service: Not on file    Active member of club or organization: Not on file    Attends meetings of clubs or organizations: Not on file    Relationship status: Not on file  . Intimate partner violence:    Fear of current or ex partner: Not  on file    Emotionally abused: Not on file    Physically abused: Not on file    Forced sexual activity: Not on file  Other Topics Concern  . Not on file  Social History Narrative  . Not on file   Family History  Problem Relation Age of Onset  . Tuberculosis Mother   . Tuberculosis Father   . Heart disease Brother   . Diabetes Sister   . Diabetes Brother   . Clotting disorder Brother       VITAL SIGNS BP 130/60   Pulse 72   Temp 97.8 F (36.6 C)  Resp 18   Ht _0  (1.854 m)   Wt 150 lb (68 kg)   SpO2 97%   BMI 19.79 kg/m   Outpatient Encounter Medications as of 06/08/2017  Medication Sig  . acetaminophen (TYLENOL) 650 MG suppository Place 1 suppository (650 mg total) rectally every 6 (six) hours as needed for fever or mild pain (T over 101).  . ADVAIR DISKUS 250-50 MCG/DOSE AEPB Inhale 1 puff into the lungs 2 (two) times daily.   . Artificial Saliva (BIOTENE MOISTURIZING MOUTH) SOLN Give 1 swab by mouth every 4 hours while awake  . digoxin (LANOXIN) 0.05 MG/ML solution Place 2.5 mLs (0.125 mg total) into feeding tube daily.  . fluticasone (FLONASE) 50 MCG/ACT nasal spray Place 1 spray into both nostrils daily as needed for allergies.  . furosemide (LASIX) 10 MG/ML solution Place 20 mg into feeding tube daily.   Marland Kitchen gabapentin (NEURONTIN) 100 MG capsule Give 1 Capsule (114m) to 3 capsules (3092m enterally at bedtime as needed  . ipratropium (ATROVENT) 0.03 % nasal spray Place 1 spray into both nostrils 3 (three) times daily before meals.  . Marland Kitchenpratropium-albuterol (DUONEB) 0.5-2.5 (3) MG/3ML SOLN Take 3 mLs by nebulization every 4 (four) hours as needed.  . Lisinopril 1 MG/ML SOLN Place 2.5 mg into feeding tube daily.  . Marland Kitchenoratadine (CLARITIN) 10 MG tablet Take 1 tablet (10 mg total) by mouth daily as needed for allergies.  . Marland Kitchenenthol-cetylpyridinium (CEPACOL) 3 MG lozenge Take 1 lozenge by mouth every 2 (two) hours as needed for sore throat.  . metoprolol tartrate  (LOPRESSOR) 25 mg/10 mL SUSP Place 10 mLs (25 mg total) into feeding tube 2 (two) times daily.  . nitroGLYCERIN (NITROSTAT) 0.4 MG SL tablet Place 1 tablet (0.4 mg total) under the tongue every 5 (five) minutes as needed for chest pain.  . Nutritional Supplements (FEEDING SUPPLEMENT, JEVITY 1.5 CAL,) LIQD Jevity 1.5 -  Give 240cc after lunch if PO intake is less than 50%  . Nutritional Supplements (NUTRITIONAL SUPPLEMENT PO) NPO - nothing by mouth diet, Nothing by mouth texture HSG Puree diet - HSG Puree texture, thickened Fluids consistency  . OLANZapine zydis (ZYPREXA) 5 MG disintegrating tablet Take 1 tablet (5 mg total) by mouth at bedtime.  . ondansetron (ZOFRAN) 4 MG tablet Place 4 mg into feeding tube every 6 (six) hours as needed for nausea or vomiting.  . pantoprazole sodium (PROTONIX) 40 mg/20 mL PACK Place 20 mLs (40 mg total) into feeding tube daily.  . polyethylene glycol (MIRALAX / GLYCOLAX) packet Place 17 g into feeding tube daily.  . potassium chloride 20 MEQ/15ML (10%) SOLN Place 7.5 mLs (10 mEq total) into feeding tube daily.  . RESTASIS 0.05 % ophthalmic emulsion INSTILL 1 DROP INTO BOTH EYES TWICE A DAY  . tiotropium (SPIRIVA) 18 MCG inhalation capsule Place 18 mcg into inhaler and inhale daily.  . traMADol (ULTRAM) 5 mg/mL SUSP Place 10-20 mLs (50-100 mg total) into feeding tube every 6 (six) hours as needed for moderate pain.  . Water For Irrigation, Sterile (FREE WATER) SOLN Place into feeding tube every 4 (four) hours. 150 ml H2o flushes via tube every 4 hours  . [DISCONTINUED] fluconazole (DIFLUCAN) 200 MG tablet Place 200 mg into feeding tube daily. X 14 days   No facility-administered encounter medications on file as of 06/08/2017.      SIGNIFICANT DIAGNOSTIC EXAMS  PREVIOUS:   03-26-17: ct of chest abdomen and pelvis: 1. No acute/traumatic intrathoracic, abdominal, or pelvic pathology. 2. Nonocclusive  pulmonary embolus in the subsegmental right lower lobe pulmonary  artery branches likely old PE or scarring. Nonocclusive acute PE is not entirely excluded. Clinical correlation is recommended. 3. Mild cardiomegaly with dilatation of the right atrium. 4. 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 Small right pleural effusion and stable right lung base round atelectasis/scarring. 5. Aortic Atherosclerosis  and Emphysema    04-07-17: chest x-ray: Suspected patchy infiltrate at the right base, short interval radiographic follow-up suggested to ensure clearing. Cardiomegaly.  04-09-17: chest x-ray: COPD, cardiomegaly, stable. Increasing right basilar airspace opacity and small right effusion concerning for pneumonia  04-10-17: swallow study: Recommend NPO with short term alternative nutrition and continued ST   04-19-17: PEG tube insertion   05-01-17: chest x-ray: mild right basilar infiltrate with trace right sided effusion  NO NEW EXAMS      LABS REVIEWED: PREVIOUS:   04-07-17: wbc 9.5; hgb 13.4; hct 41.9; mcv 94.2; plt 101; glucose 84; bun 18; creat 1.28 ;k+ 4.0; na++ 136; ca 8.2; total bili 2.5; albumin 3.2; rapid flu: +A; blood culture: no growth; urine culture: <10,000 colonies 04-09-17: wbc 9.0; hgb 11.5; hct 35.1; mcv 91.9; plt 120; glucose 88; bun 9; creat 0.86; k+ 3.5; na++ 133; ca 7.5; mag 1.7 04-13-17: wbc 9.4; hgb 13.8; hct 42.6; mcv 94.0; plt 217; glucose 115; bun 11; creat 1.04 ;k+ 3.3; na++144; ca 8.7 04-16-17: wbc 6.5; hgb 12.2; hct 38.5; mcv 95.3; plt 185; glucose 115; bun 11; creat 0.78; k+ 4.0; na++ 143; ca 8.9; mag 1.7  04-20-17:wbc 12.4; hgb 12.2; hct 38.0; mcv 93.6; plt 209; glucose 110; bun 18; creat 0.95; k+ 4.7; na++ 136; ca 8.4; mag 2.7;  04-22-17: mag 1.9 05-02-17: wbc 7.1; hgb 11.8; hct 34.0; mcv 87.6; plt 223; glucose 130; bun 22.2; creat 0.71; k+ 4.4; na++ 143; ca 9.1; alk phos 123; albumin 3.4; tsh 0.91   NO NEW LABS   Review of Systems  Reason unable to perform ROS: poor historian   Constitutional: Negative for  malaise/fatigue.  HENT:       Can't get food down Great deal of sputum   Respiratory: Positive for cough.   Cardiovascular: Negative for chest pain.  Gastrointestinal: Negative for abdominal pain.  Musculoskeletal: Negative for back pain.  Skin: Negative.   Neurological: Negative for dizziness.  Psychiatric/Behavioral: The patient is not nervous/anxious.      Physical Exam  Constitutional: He appears well-developed and well-nourished. No distress.  HENT:  Has significant oral thrush   Neck: No thyromegaly present.  Cardiovascular: Normal rate, regular rhythm and intact distal pulses.  Murmur heard. 1/6  Pulmonary/Chest: Effort normal and breath sounds normal. No stridor. No respiratory distress.  Abdominal: Soft. Bowel sounds are normal. He exhibits no distension. There is no tenderness.  Peg tube present without signs of infection present   Musculoskeletal: Normal range of motion. He exhibits no edema.  Lymphadenopathy:    He has no cervical adenopathy.  Neurological: He is alert.  Skin: Skin is warm and dry. He is not diaphoretic.  Psychiatric: He has a normal mood and affect.     ASSESSMENT/ PLAN:  TODAY:   1. Oral thrush: will being diflucan 200 mg daily for 14 days. Will setup a GI consult for oropharyngeal severe dysphagia with repetitive thrush.   MD is aware of resident's narcotic use and is in agreement with current plan of care. We will attempt to wean resident as apropriate   Ok Edwards NP [facility]  Contact [phone] Monday through Friday 8am- 5pm  After hours call [phone]

## 2017-06-12 ENCOUNTER — Other Ambulatory Visit: Payer: Self-pay | Admitting: Hematology and Oncology

## 2017-06-12 ENCOUNTER — Other Ambulatory Visit: Payer: Self-pay | Admitting: *Deleted

## 2017-06-12 DIAGNOSIS — D509 Iron deficiency anemia, unspecified: Secondary | ICD-10-CM

## 2017-06-12 NOTE — Patient Outreach (Signed)
[facility]) Care Management  06/12/2017  [name] [birth year] [id number]   CSW was able to make contact with [facility], patient's attending Nurse at [facility] at [place], [place] where patient currently resides to receive short-term rehabilitative services, to receive an update on patient.  Glennis reports that patient is not doing very well today and appears to be more confused than usual.  Glennis went on to say that patient is not tolerating solid foods, he swallows food and then regurgitates it right back up.  Glennis reported that she has requested a GI (Gastrointestinal) consult for patient, as she would like to have a Barium Swallow Study performed.  Patient continues to work with therapies (speech, physical and occupational), but is making minimal progress with speech therapy.  Patient will continue to receive bolus tube feeds until he is able to tolerate solid foods. Patient still does not have a tentative discharge date scheduled at this time.  CSW will follow-up wit patient again next week to assess and assist with discharge planning needs and services. [name], BSW, MSW, LCSW  Licensed Education officer, [facility]  Mailing [place] [address], [place], [place] [address] Physical Address-[address], [place], [place] [address] Toll Free Main # [phone] Fax # [phone] Cell # [phone]  Office # [phone] [email or web address]

## 2017-06-13 ENCOUNTER — Other Ambulatory Visit (HOSPITAL_COMMUNITY): Payer: Self-pay | Admitting: Internal Medicine

## 2017-06-13 DIAGNOSIS — R1319 Other dysphagia: Secondary | ICD-10-CM

## 2017-06-15 ENCOUNTER — Non-Acute Institutional Stay: Payer: Self-pay | Admitting: Internal Medicine

## 2017-06-15 DIAGNOSIS — R531 Weakness: Secondary | ICD-10-CM

## 2017-06-15 DIAGNOSIS — R1310 Dysphagia, unspecified: Secondary | ICD-10-CM

## 2017-06-16 ENCOUNTER — Inpatient Hospital Stay
Payer: No Typology Code available for payment source | Attending: Hematology and Oncology | Admitting: Hematology and Oncology

## 2017-06-16 ENCOUNTER — Inpatient Hospital Stay: Payer: No Typology Code available for payment source

## 2017-06-16 ENCOUNTER — Encounter: Payer: Self-pay | Admitting: Hematology and Oncology

## 2017-06-16 DIAGNOSIS — Z79899 Other long term (current) drug therapy: Secondary | ICD-10-CM | POA: Insufficient documentation

## 2017-06-16 DIAGNOSIS — D696 Thrombocytopenia, unspecified: Secondary | ICD-10-CM | POA: Insufficient documentation

## 2017-06-16 DIAGNOSIS — D509 Iron deficiency anemia, unspecified: Secondary | ICD-10-CM

## 2017-06-16 DIAGNOSIS — I482 Chronic atrial fibrillation, unspecified: Secondary | ICD-10-CM

## 2017-06-16 DIAGNOSIS — D72821 Monocytosis (symptomatic): Secondary | ICD-10-CM

## 2017-06-16 DIAGNOSIS — R54 Age-related physical debility: Secondary | ICD-10-CM | POA: Diagnosis not present

## 2017-06-16 LAB — CBC WITH DIFFERENTIAL/PLATELET
Basophils Absolute: 0.1 10*3/uL (ref 0.0–0.1)
Basophils Relative: 1 %
Eosinophils Absolute: 0.2 10*3/uL (ref 0.0–0.5)
Eosinophils Relative: 2 %
HEMATOCRIT: 42.2 % (ref 38.4–49.9)
HEMOGLOBIN: 13.9 g/dL (ref 13.0–17.1)
LYMPHS ABS: 3.2 10*3/uL (ref 0.9–3.3)
LYMPHS PCT: 38 %
MCH: 30.1 pg (ref 27.2–33.4)
MCHC: 33 g/dL (ref 32.0–36.0)
MCV: 91.3 fL (ref 79.3–98.0)
MONOS PCT: 20 %
Monocytes Absolute: 1.7 10*3/uL — ABNORMAL HIGH (ref 0.1–0.9)
NEUTROS ABS: 3.4 10*3/uL (ref 1.5–6.5)
NEUTROS PCT: 39 %
Platelets: 179 10*3/uL (ref 140–400)
RBC: 4.63 MIL/uL (ref 4.20–5.82)
RDW: 17.9 % — ABNORMAL HIGH (ref 11.0–14.6)
WBC: 8.7 10*3/uL (ref 4.0–10.3)

## 2017-06-16 NOTE — Progress Notes (Signed)
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 He has chronic monocytosis for many years but he is not symptomatic.  There has been no change in his monocyte count over the last 4 years The patient is frail with multiple recurrent admissions to the hospital I do not recommend future follow-up here in hematology clinic as he is unlikely is able to tolerate treatment for CMML  Chronic atrial fibrillation ([place]) He has chronic atrial fibrillation, rate controlled I would defer to his cardiologist for further management   No orders of the defined types were placed in this encounter.   INTERVAL HISTORY: [name] 84 y.o. male returns for further follow-up for CMML and history of iron deficiency anemia with intermittent thrombocytopenia He is very frail He is currently residing in the nursing home He has numerous admissions to the hospital for various reasons He does not know how long he will remain in a skilled nursing facility The patient denies any recent signs or symptoms of bleeding such as spontaneous epistaxis, hematuria or hematochezia.  SUMMARY OF HEMATOLOGIC HISTORY:  This patient was followed by another physician for iron deficiency anemia due to angiodysplasia and possible GI bleed from anticoagulation therapy. The patient was also noted to have chronic monocytosis, suspect undiagnosed CMML. He is on observation for CMML  I have reviewed the past medical history, past surgical history, social history and family history with the patient and they are unchanged from previous note.  ALLERGIES:  is allergic to tamsulosin; celebrex [celecoxib]; dronedarone; esomeprazole magnesium; digoxin; hydrocodone-acetaminophen; and protonix [pantoprazole sodium].  MEDICATIONS:  Current Outpatient Medications  Medication Sig Dispense Refill  . acetaminophen (TYLENOL) 650 MG suppository Place 1  suppository (650 mg total) rectally every 6 (six) hours as needed for fever or mild pain (T over 101). 12 suppository 0  . ADVAIR DISKUS 250-50 MCG/DOSE AEPB Inhale 1 puff into the lungs 2 (two) times daily.   4  . Artificial Saliva (BIOTENE MOISTURIZING MOUTH) SOLN Give 1 swab by mouth every 4 hours while awake    . digoxin (LANOXIN) 0.05 MG/ML solution Place 2.5 mLs (0.125 mg total) into feeding tube daily. 60 mL 0  . fluticasone (FLONASE) 50 MCG/ACT nasal spray Place 1 spray into both nostrils daily as needed for allergies.  2  . furosemide (LASIX) 10 MG/ML solution Place 20 mg into feeding tube daily.     Marland Kitchen gabapentin (NEURONTIN) 100 MG capsule Give 1 Capsule (100mg ) to 3 capsules (300mg ) enterally at bedtime as needed    . ipratropium (ATROVENT) 0.03 % nasal spray Place 1 spray into both nostrils 3 (three) times daily before meals.    Marland Kitchen ipratropium-albuterol (DUONEB) 0.5-2.5 (3) MG/3ML SOLN Take 3 mLs by nebulization every 4 (four) hours as needed. 360 mL 0  . Lisinopril 1 MG/ML SOLN Place 2.5 mg into feeding tube daily. 90 mL 0  . loratadine (CLARITIN) 10 MG tablet Take 1 tablet (10 mg total) by mouth daily as needed for allergies.    Marland Kitchen menthol-cetylpyridinium (CEPACOL) 3 MG lozenge Take 1 lozenge by mouth every 2 (two) hours as needed for sore throat.    . metoprolol tartrate (LOPRESSOR) 25 mg/10 mL SUSP Place 10 mLs (25 mg total) into feeding tube 2 (two) times daily. 600 mL 0  . nitroGLYCERIN (NITROSTAT) 0.4 MG SL tablet Place 1 tablet (0.4 mg total) under the tongue every 5 (five) minutes as needed for  chest pain.  12  . Nutritional Supplements (FEEDING SUPPLEMENT, JEVITY 1.5 CAL,) LIQD Jevity 1.5 -  Give 240cc after lunch if PO intake is less than 50%    . Nutritional Supplements (NUTRITIONAL SUPPLEMENT PO) NPO - nothing by mouth diet, Nothing by mouth texture HSG Puree diet - HSG Puree texture, thickened Fluids consistency    . OLANZapine zydis (ZYPREXA) 5 MG disintegrating tablet Take 1  tablet (5 mg total) by mouth at bedtime. 20 tablet 0  . ondansetron (ZOFRAN) 4 MG tablet Place 4 mg into feeding tube every 6 (six) hours as needed for nausea or vomiting.    . pantoprazole sodium (PROTONIX) 40 mg/20 mL PACK Place 20 mLs (40 mg total) into feeding tube daily. 30 each 0  . polyethylene glycol (MIRALAX / GLYCOLAX) packet Place 17 g into feeding tube daily.    . potassium chloride 20 MEQ/15ML (10%) SOLN Place 7.5 mLs (10 mEq total) into feeding tube daily. 240 mL 0  . RESTASIS 0.05 % ophthalmic emulsion INSTILL 1 DROP INTO BOTH EYES TWICE A DAY 180 mL 3  . tiotropium (SPIRIVA) 18 MCG inhalation capsule Place 18 mcg into inhaler and inhale daily.    . traMADol (ULTRAM) 5 mg/mL SUSP Place 10-20 mLs (50-100 mg total) into feeding tube every 6 (six) hours as needed for moderate pain. 60 mL 0  . Water For Irrigation, Sterile (FREE WATER) SOLN Place into feeding tube every 4 (four) hours. 150 ml H2o flushes via tube every 4 hours     No current facility-administered medications for this visit.      REVIEW OF SYSTEMS:   Constitutional: Denies fevers, chills or night sweats Eyes: Denies blurriness of vision Ears, nose, mouth, throat, and face: Denies mucositis or sore throat Respiratory: Denies cough, dyspnea or wheezes Cardiovascular: Denies palpitation, chest discomfort or lower extremity swelling Gastrointestinal:  Denies nausea, heartburn or change in bowel habits Skin: Denies abnormal skin rashes Lymphatics: Denies new lymphadenopathy or easy bruising Neurological:Denies numbness, tingling or new weaknesses Behavioral/Psych: Mood is stable, no new changes  All other systems were reviewed with the patient and are negative.  PHYSICAL EXAMINATION: ECOG PERFORMANCE STATUS: 2 - Symptomatic, <50% confined to bed  Vitals:   06/16/17 1006  BP: 135/74  Pulse: (!) 109  Resp: 18  Temp: (!) 97.4 F (36.3 C)  SpO2: 96%   Filed Weights   06/16/17 1006  Weight: 151 lb 12.8 oz  (68.9 kg)    GENERAL:alert, no distress and comfortable.  He looks frail, thin and cachectic SKIN: skin color, texture, turgor are normal, no rashes or significant lesions EYES: normal, Conjunctiva are pink and non-injected, sclera clear OROPHARYNX:no exudate, no erythema and lips, buccal mucosa, and tongue normal  NECK: supple, thyroid normal size, non-tender, without nodularity LYMPH:  no palpable lymphadenopathy in the cervical, axillary or inguinal LUNGS: clear to auscultation and percussion with normal breathing effort HEART: Irregular rate and rhythm, no murmurs and no lower extremity edema ABDOMEN:abdomen soft, non-tender and normal bowel sounds Musculoskeletal:no cyanosis of digits and no clubbing  NEURO: alert & oriented x 3 with fluent speech, no focal motor/sensory deficits  LABORATORY DATA:  I have reviewed the data as listed     Component Value Date/Time   NA 143 05/02/2017   NA 143 10/04/2012 0920   K 4.4 05/02/2017   K 4.1 10/04/2012 0920   CL 100 (L) 04/24/2017 0646   CL 105 03/19/2012 1539   CO2 25 04/24/2017 0646   CO2  26 10/04/2012 0920   GLUCOSE 147 (H) 04/24/2017 0646   GLUCOSE 130 10/04/2012 0920   GLUCOSE 110 (H) 03/19/2012 1539   BUN 22 (A) 05/02/2017   BUN 24.9 10/04/2012 0920   CREATININE 0.7 05/02/2017   CREATININE 0.81 04/24/2017 0646   CREATININE 1.6 (H) 10/04/2012 0920   CALCIUM 8.6 (L) 04/24/2017 0646   CALCIUM 9.1 10/04/2012 0920   PROT 6.2 (L) 04/07/2017 0236   PROT 7.1 10/04/2012 0920   ALBUMIN 3.2 (L) 04/07/2017 0236   ALBUMIN 3.2 (L) 10/04/2012 0920   AST 17 05/02/2017   AST 22 10/04/2012 0920   ALT 8 (A) 05/02/2017   ALT 14 10/04/2012 0920   ALKPHOS 132 (A) 05/02/2017   ALKPHOS 96 10/04/2012 0920   BILITOT 2.5 (H) 04/07/2017 0236   BILITOT 0.51 10/04/2012 0920   GFRNONAA >60 04/24/2017 0646   GFRAA >60 04/24/2017 0646    No results found for: SPEP, UPEP  Lab Results  Component Value Date   WBC 8.7 06/16/2017   NEUTROABS  3.4 06/16/2017   HGB 13.9 06/16/2017   HCT 42.2 06/16/2017   MCV 91.3 06/16/2017   PLT 179 06/16/2017      Chemistry      Component Value Date/Time   NA 143 05/02/2017   NA 143 10/04/2012 0920   K 4.4 05/02/2017   K 4.1 10/04/2012 0920   CL 100 (L) 04/24/2017 0646   CL 105 03/19/2012 1539   CO2 25 04/24/2017 0646   CO2 26 10/04/2012 0920   BUN 22 (A) 05/02/2017   BUN 24.9 10/04/2012 0920   CREATININE 0.7 05/02/2017   CREATININE 0.81 04/24/2017 0646   CREATININE 1.6 (H) 10/04/2012 0920   GLU 130 05/02/2017      Component Value Date/Time   CALCIUM 8.6 (L) 04/24/2017 0646   CALCIUM 9.1 10/04/2012 0920   ALKPHOS 132 (A) 05/02/2017   ALKPHOS 96 10/04/2012 0920   AST 17 05/02/2017   AST 22 10/04/2012 0920   ALT 8 (A) 05/02/2017   ALT 14 10/04/2012 0920   BILITOT 2.5 (H) 04/07/2017 0236   BILITOT 0.51 10/04/2012 0920      I spent 10 minutes counseling the patient face to face. The total time spent in the appointment was 15 minutes and more than 50% was on counseling.   All questions were answered. The patient knows to call the clinic with any problems, questions or concerns. No barriers to learning was detected.    Heath Lark, MD 5/17/201910:59 AM

## 2017-06-16 NOTE — Assessment & Plan Note (Signed)
He has chronic atrial fibrillation, rate controlled I would defer to his cardiologist for further management

## 2017-06-16 NOTE — Assessment & Plan Note (Signed)
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 He has chronic monocytosis for many years but he is not symptomatic.  There has been no change in his monocyte count over the last 4 years The patient is frail with multiple recurrent admissions to the hospital I do not recommend future follow-up here in hematology clinic as he is unlikely is able to tolerate treatment for CMML

## 2017-06-19 ENCOUNTER — Ambulatory Visit (HOSPITAL_COMMUNITY)
Admission: RE | Admit: 2017-06-19 | Discharge: 2017-06-19 | Disposition: A | Discharge status: Home / Self Care | Payer: No Typology Code available for payment source | Source: Ambulatory Visit | Attending: Internal Medicine | Admitting: Internal Medicine

## 2017-06-19 ENCOUNTER — Other Ambulatory Visit: Payer: Self-pay | Admitting: *Deleted

## 2017-06-19 DIAGNOSIS — R1310 Dysphagia, unspecified: Secondary | ICD-10-CM | POA: Diagnosis not present

## 2017-06-19 DIAGNOSIS — R1312 Dysphagia, oropharyngeal phase: Secondary | ICD-10-CM | POA: Diagnosis not present

## 2017-06-19 DIAGNOSIS — R1314 Dysphagia, pharyngoesophageal phase: Secondary | ICD-10-CM | POA: Insufficient documentation

## 2017-06-19 DIAGNOSIS — R1319 Other dysphagia: Secondary | ICD-10-CM

## 2017-06-20 NOTE — Patient Outreach (Signed)
[facility]) Care Management  06/20/2017  [name] 02/06/1932 [id number]  CSW covering for [employer], LCSW, received a call back from SNF rep who indicates pt is still in skilled care with an anticipated plan for ALF or long term at [facility].  Pt is not eating and has feeding tube; "he is not safe to return home".   Per SNF rep, there are no definite plans or dates for transition.   CSW will update assigned CSW upon her return and she will follow up for further planning and updates.   Eduard Clos, MSW, [place] Worker  [place] [phone]

## 2017-06-21 NOTE — Progress Notes (Signed)
PALLIATIVE CARE CONSULT VISIT   PATIENT NAME: [name] DOB: 07-21-1932 MRN: [id number]  PRIMARY CARE PROVIDER:    [name], MD [place], [place] [address]  REFERRING PROVIDER:     Dr. Gildardo Cranker  RESPONSIBLE PARTY:   Self   RECOMMENDATIONS and PLAN:  1.Dysphagia R 13.10:  Dependent upon tube feedings and now presenting with thrush.  Pending GI consult.  Frequent oral care.  Continue antifungal tx.  Aspiration risk.  2. Generalized weakness  R53.1:  Unchanged and is baseline. Continue supportive care.  Nutritional evaluation.    3. Advanced Care Planning:  Remains full scope of treatment.  Palliative care will continue to monitor.  I spent 15 minutes providing this consultation at [facility],  from 12:30pm to 12:45pm. More than 50% of the time in this consultation was spent coordinating communication with patient and facility staff.   HISTORY OF PRESENT ILLNESS:  Routine followup with [name] finds that he has been without acute illnesses, falls or hospitalizations.  His tongue has been sensitive and is coated. He reports that he is not eating any foods orally.  CODE STATUS:  FULL CODE  PPS: 40% HOSPICE ELIGIBILITY/DIAGNOSIS: TBD  PAST MEDICAL HISTORY:  Past Medical History:  Diagnosis Date  . Abnormal thyroid scan    Abnormal thyroid imaging studies from 11/09/2010, status post ultrasound guided fine needle aspiration of the dominant left inferior thyroid nodule on 12/15/2010. Cytology report showed rare follicular epithelial cells and hemosiderin laden macrophages.  . Adenomatous colon polyp   . AICD (automatic cardioverter/defibrillator) present    a. fx lead; a. s/p lead extraction 03/02/17  . Arthritis    "all over"  . Asthma   . Atrial fibrillation ([place])    on chronic Coumadin; stopped July 2013 due to subdural hematomas  . CHF (congestive heart failure) (HCC)    EF 35-40% s/p most recent ICD generator change-out  with Medtronic dual-chamber ICD 05/20/11 with explantation of previous abdominally-implanted device  . Coronary artery disease    s/p CABG 1983 and PCI/stent 2004.   . Diabetes mellitus    diet controlled  . Diverticulosis   . Dyslipidemia   . Enteritis   . Erythrocytosis   . GERD (gastroesophageal reflux disease)   . Hepatic steatosis   . Hypertension   . Ischemic cardiomyopathy    WITH CHF  . Monocytosis 04/17/2013  . Myocardial infarction ([place]) 1983; ~ 1990  . Pleural effusion    right  . Pneumonia August 2013  . Portal hypertensive gastropathy ([place])   . Rectal ulcer   . Renal calculi   . Subdural hematoma [facility]) July 2013   Anticoagulation stopped.   . SunDown syndrome   . VT (ventricular tachycardia) ([place])     SOCIAL HX:  Social History   Tobacco Use  . Smoking status: Former Smoker    Packs/day: 2.00    Years: 30.00    Pack years: 60.00    Types: Cigarettes    Last attempt to quit: 06/23/1976    Years since quitting: 41.0  . Smokeless tobacco: Never Used  Substance Use Topics  . Alcohol use: No    ALLERGIES:  Allergies  Allergen Reactions  . Tamsulosin Other (See Comments)    Dizziness, Made BP very low and weakness  . Celebrex [Celecoxib] Hives and Nausea And Vomiting    Gi upset  . Dronedarone Nausea And Vomiting and Other (See Comments)    GI upset,  abdominal pain  . Esomeprazole Magnesium Hives and Other (See Comments)    "don't really remember"  . Digoxin Diarrhea    May have caused some diarrhea  . Hydrocodone-Acetaminophen Other (See Comments)    Bad headache  . Protonix [Pantoprazole Sodium] Nausea And Vomiting and Other (See Comments)    Tolerates Dexilant     PERTINENT MEDICATIONS:  Outpatient Encounter Medications as of 06/15/2017  Medication Sig  . acetaminophen (TYLENOL) 650 MG suppository Place 1 suppository (650 mg total) rectally every 6 (six) hours as needed for fever or mild pain (T over 101).  . ADVAIR DISKUS 250-50 MCG/DOSE AEPB  Inhale 1 puff into the lungs 2 (two) times daily.   . Artificial Saliva (BIOTENE MOISTURIZING MOUTH) SOLN Give 1 swab by mouth every 4 hours while awake  . digoxin (LANOXIN) 0.05 MG/ML solution Place 2.5 mLs (0.125 mg total) into feeding tube daily.  . fluticasone (FLONASE) 50 MCG/ACT nasal spray Place 1 spray into both nostrils daily as needed for allergies.  . furosemide (LASIX) 10 MG/ML solution Place 20 mg into feeding tube daily.   Marland Kitchen gabapentin (NEURONTIN) 100 MG capsule Give 1 Capsule (100mg ) to 3 capsules (300mg ) enterally at bedtime as needed  . ipratropium (ATROVENT) 0.03 % nasal spray Place 1 spray into both nostrils 3 (three) times daily before meals.  Marland Kitchen ipratropium-albuterol (DUONEB) 0.5-2.5 (3) MG/3ML SOLN Take 3 mLs by nebulization every 4 (four) hours as needed.  . Lisinopril 1 MG/ML SOLN Place 2.5 mg into feeding tube daily.  Marland Kitchen loratadine (CLARITIN) 10 MG tablet Take 1 tablet (10 mg total) by mouth daily as needed for allergies.  Marland Kitchen menthol-cetylpyridinium (CEPACOL) 3 MG lozenge Take 1 lozenge by mouth every 2 (two) hours as needed for sore throat.  . metoprolol tartrate (LOPRESSOR) 25 mg/10 mL SUSP Place 10 mLs (25 mg total) into feeding tube 2 (two) times daily.  . nitroGLYCERIN (NITROSTAT) 0.4 MG SL tablet Place 1 tablet (0.4 mg total) under the tongue every 5 (five) minutes as needed for chest pain.  . Nutritional Supplements (FEEDING SUPPLEMENT, JEVITY 1.5 CAL,) LIQD Jevity 1.5 -  Give 240cc after lunch if PO intake is less than 50%  . Nutritional Supplements (NUTRITIONAL SUPPLEMENT PO) NPO - nothing by mouth diet, Nothing by mouth texture HSG Puree diet - HSG Puree texture, thickened Fluids consistency  . OLANZapine zydis (ZYPREXA) 5 MG disintegrating tablet Take 1 tablet (5 mg total) by mouth at bedtime.  . ondansetron (ZOFRAN) 4 MG tablet Place 4 mg into feeding tube every 6 (six) hours as needed for nausea or vomiting.  . pantoprazole sodium (PROTONIX) 40 mg/20 mL PACK Place  20 mLs (40 mg total) into feeding tube daily.  . polyethylene glycol (MIRALAX / GLYCOLAX) packet Place 17 g into feeding tube daily.  . potassium chloride 20 MEQ/15ML (10%) SOLN Place 7.5 mLs (10 mEq total) into feeding tube daily.  . RESTASIS 0.05 % ophthalmic emulsion INSTILL 1 DROP INTO BOTH EYES TWICE A DAY  . tiotropium (SPIRIVA) 18 MCG inhalation capsule Place 18 mcg into inhaler and inhale daily.  . traMADol (ULTRAM) 5 mg/mL SUSP Place 10-20 mLs (50-100 mg total) into feeding tube every 6 (six) hours as needed for moderate pain.  . Water For Irrigation, Sterile (FREE WATER) SOLN Place into feeding tube every 4 (four) hours. 150 ml H2o flushes via tube every 4 hours   No facility-administered encounter medications on file as of 06/15/2017.     PHYSICAL EXAM:   General:  NAD, frail appearing elderly male resting in bed ENT:  Dry mucus membranes with tan consolidation on tongue Cardiovascular: regular rate and rhythm Pulmonary: clear throughout Abdomen: soft, nontender, + bowel sounds. PEG tube noted GU: no suprapubic tenderness Extremities: no edema ir ecchymosis Skin: no rashes Neurological: A&O x 3,  Weakness   Gonzella Lex, NP-C

## 2017-06-22 DIAGNOSIS — G3184 Mild cognitive impairment, so stated: Secondary | ICD-10-CM | POA: Diagnosis not present

## 2017-06-28 ENCOUNTER — Other Ambulatory Visit: Payer: Self-pay | Admitting: *Deleted

## 2017-06-28 ENCOUNTER — Ambulatory Visit (INDEPENDENT_AMBULATORY_CARE_PROVIDER_SITE_OTHER): Payer: Medicare Other | Admitting: Internal Medicine

## 2017-06-28 ENCOUNTER — Encounter: Payer: Self-pay | Admitting: Internal Medicine

## 2017-06-28 VITALS — BP 116/76 | HR 74 | Ht 72.0 in | Wt 152.0 lb

## 2017-06-28 DIAGNOSIS — I482 Chronic atrial fibrillation, unspecified: Secondary | ICD-10-CM

## 2017-06-28 DIAGNOSIS — I25119 Atherosclerotic heart disease of native coronary artery with unspecified angina pectoris: Secondary | ICD-10-CM

## 2017-06-28 DIAGNOSIS — Z95810 Presence of automatic (implantable) cardiac defibrillator: Secondary | ICD-10-CM | POA: Diagnosis not present

## 2017-06-28 DIAGNOSIS — I472 Ventricular tachycardia: Secondary | ICD-10-CM

## 2017-06-28 DIAGNOSIS — I5022 Chronic systolic (congestive) heart failure: Secondary | ICD-10-CM | POA: Diagnosis not present

## 2017-06-28 DIAGNOSIS — I4729 Other ventricular tachycardia: Secondary | ICD-10-CM

## 2017-06-28 LAB — CUP PACEART INCLINIC DEVICE CHECK
Battery Remaining Longevity: 130 mo
Brady Statistic RV Percent Paced: 7.53 %
HIGH POWER IMPEDANCE MEASURED VALUE: 71 Ohm
HighPow Impedance: 254 Ohm
Implantable Lead Location: 753860
Implantable Lead Model: 6935
Lead Channel Impedance Value: 399 Ohm
Lead Channel Impedance Value: 456 Ohm
Lead Channel Setting Pacing Pulse Width: 0.4 ms
Lead Channel Setting Sensing Sensitivity: 0.3 mV
MDC IDC LEAD IMPLANT DT: 20190131
MDC IDC MSMT BATTERY VOLTAGE: 2.97 V
MDC IDC MSMT LEADCHNL RV PACING THRESHOLD AMPLITUDE: 0.75 V
MDC IDC MSMT LEADCHNL RV PACING THRESHOLD PULSEWIDTH: 0.4 ms
MDC IDC MSMT LEADCHNL RV SENSING INTR AMPL: 24.375 mV
MDC IDC PG IMPLANT DT: 20180309
MDC IDC SESS DTM: 20190529113009
MDC IDC SET LEADCHNL RV PACING AMPLITUDE: 2.5 V

## 2017-06-28 NOTE — Progress Notes (Addendum)
HPI [name] returns today for followup. He was in the hospital back in March and his device was turned off. He has had a marked improvement in his symptoms. He denies chest pain and his dyspnea is class 2. He has not passed out and he denies edema. His diarrhea is improved.  Allergies  Allergen Reactions  . Tamsulosin Other (See Comments)    Dizziness, Made BP very low and weakness  . Celebrex [Celecoxib] Hives and Nausea And Vomiting    Gi upset  . Dronedarone Nausea And Vomiting and Other (See Comments)    GI upset, abdominal pain  . Esomeprazole Magnesium Hives and Other (See Comments)    "don't really remember"  . Digoxin Diarrhea    May have caused some diarrhea  . Hydrocodone-Acetaminophen Other (See Comments)    Bad headache  . Protonix [Pantoprazole Sodium] Nausea And Vomiting and Other (See Comments)    Tolerates Dexilant     Current Outpatient Medications  Medication Sig Dispense Refill  . acetaminophen (TYLENOL) 650 MG suppository Place 1 suppository (650 mg total) rectally every 6 (six) hours as needed for fever or mild pain (T over 101). 12 suppository 0  . ADVAIR DISKUS 250-50 MCG/DOSE AEPB Inhale 1 puff into the lungs 2 (two) times daily.   4  . Artificial Saliva (BIOTENE MOISTURIZING MOUTH) SOLN Give 1 swab by mouth every 4 hours while awake    . digoxin (LANOXIN) 0.05 MG/ML solution Place 2.5 mLs (0.125 mg total) into feeding tube daily. 60 mL 0  . fluticasone (FLONASE) 50 MCG/ACT nasal spray Place 1 spray into both nostrils daily as needed for allergies.  2  . furosemide (LASIX) 10 MG/ML solution Place 20 mg into feeding tube daily.     Marland Kitchen gabapentin (NEURONTIN) 100 MG capsule Give 1 Capsule (100mg ) to 3 capsules (300mg ) enterally at bedtime as needed    . ipratropium (ATROVENT) 0.03 % nasal spray Place 1 spray into both nostrils 3 (three) times daily before meals.    Marland Kitchen ipratropium-albuterol (DUONEB) 0.5-2.5 (3) MG/3ML SOLN Take 3 mLs by nebulization  every 4 (four) hours as needed. 360 mL 0  . Lisinopril 1 MG/ML SOLN Place 2.5 mg into feeding tube daily. 90 mL 0  . loratadine (CLARITIN) 10 MG tablet Take 1 tablet (10 mg total) by mouth daily as needed for allergies.    Marland Kitchen menthol-cetylpyridinium (CEPACOL) 3 MG lozenge Take 1 lozenge by mouth every 2 (two) hours as needed for sore throat.    . metoprolol tartrate (LOPRESSOR) 25 mg/10 mL SUSP Place 10 mLs (25 mg total) into feeding tube 2 (two) times daily. 600 mL 0  . nitroGLYCERIN (NITROSTAT) 0.4 MG SL tablet Place 1 tablet (0.4 mg total) under the tongue every 5 (five) minutes as needed for chest pain.  12  . Nutritional Supplements (FEEDING SUPPLEMENT, JEVITY 1.5 CAL,) LIQD Jevity 1.5 -  Give 240cc after lunch if PO intake is less than 50%    . Nutritional Supplements (NUTRITIONAL SUPPLEMENT PO) NPO - nothing by mouth diet, Nothing by mouth texture HSG Puree diet - HSG Puree texture, thickened Fluids consistency    . OLANZapine zydis (ZYPREXA) 5 MG disintegrating tablet Take 1 tablet (5 mg total) by mouth at bedtime. 20 tablet 0  . ondansetron (ZOFRAN) 4 MG tablet Place 4 mg into feeding tube every 6 (six) hours as needed for nausea or vomiting.    . pantoprazole sodium (PROTONIX) 40 mg/20 mL PACK Place 20  mLs (40 mg total) into feeding tube daily. 30 each 0  . polyethylene glycol (MIRALAX / GLYCOLAX) packet Place 17 g into feeding tube daily.    . potassium chloride 20 MEQ/15ML (10%) SOLN Place 7.5 mLs (10 mEq total) into feeding tube daily. 240 mL 0  . ranitidine (ZANTAC) 150 MG tablet Take 150 mg by mouth daily as needed for heartburn.    . RESTASIS 0.05 % ophthalmic emulsion INSTILL 1 DROP INTO BOTH EYES TWICE A DAY 180 mL 3  . tiotropium (SPIRIVA) 18 MCG inhalation capsule Place 18 mcg into inhaler and inhale daily.    . traMADol (ULTRAM) 5 mg/mL SUSP Place 10-20 mLs (50-100 mg total) into feeding tube every 6 (six) hours as needed for moderate pain. 60 mL 0  . Water For Irrigation,  Sterile (FREE WATER) SOLN Place into feeding tube every 4 (four) hours. 150 ml H2o flushes via tube every 4 hours     No current facility-administered medications for this visit.      Past Medical History:  Diagnosis Date  . Abnormal thyroid scan    Abnormal thyroid imaging studies from 11/09/2010, status post ultrasound guided fine needle aspiration of the dominant left inferior thyroid nodule on 12/15/2010. Cytology report showed rare follicular epithelial cells and hemosiderin laden macrophages.  . Adenomatous colon polyp   . AICD (automatic cardioverter/defibrillator) present    a. fx lead; a. s/p lead extraction 03/02/17  . Arthritis    "all over"  . Asthma   . Atrial fibrillation ([place])    on chronic Coumadin; stopped July 2013 due to subdural hematomas  . CHF (congestive heart failure) (HCC)    EF 35-40% s/p most recent ICD generator change-out with Medtronic dual-chamber ICD 05/20/11 with explantation of previous abdominally-implanted device  . Coronary artery disease    s/p CABG 1983 and PCI/stent 2004.   . Diabetes mellitus    diet controlled  . Diverticulosis   . Dyslipidemia   . Enteritis   . Erythrocytosis   . GERD (gastroesophageal reflux disease)   . Hepatic steatosis   . Hypertension   . Ischemic cardiomyopathy    WITH CHF  . Monocytosis 04/17/2013  . Myocardial infarction ([place]) 1983; ~ 1990  . Pleural effusion    right  . Pneumonia August 2013  . Portal hypertensive gastropathy ([place])   . Rectal ulcer   . Renal calculi   . Subdural hematoma [facility]) July 2013   Anticoagulation stopped.   . SunDown syndrome   . VT (ventricular tachycardia) (HCC)     ROS:   All systems reviewed and negative except as noted in the HPI.   Past Surgical History:  Procedure Laterality Date  . CHOLECYSTECTOMY    . COLONOSCOPY  07/07/2011   Procedure: COLONOSCOPY;  Surgeon: Jerene Bears, MD;  Location: [facility] ENDOSCOPY;  Service: Gastroenterology;  Laterality: N/A;  . CORONARY  ANGIOPLASTY WITH STENT PLACEMENT  2004   Tandem Cypher stents LAD  . CORONARY ARTERY BYPASS GRAFT  1983   SVG-mLAD  . ESOPHAGOGASTRODUODENOSCOPY  02/11/2011   Procedure: ESOPHAGOGASTRODUODENOSCOPY (EGD);  Surgeon: Beryle Beams, MD;  Location: [facility];  Service: Endoscopy;  Laterality: N/A;  . FLEXIBLE SIGMOIDOSCOPY N/A 02/08/2017   Procedure: FLEXIBLE SIGMOIDOSCOPY;  Surgeon: Irene Shipper, MD;  Location: [facility];  Service: Endoscopy;  Laterality: N/A;  . ICD GENERATOR CHANGEOUT N/A 04/08/2016   Procedure: ICD Generator Changeout;  Surgeon: Evans Lance, MD;  Location: [place] [facility];  Service: Cardiovascular;  Laterality: N/A;  . ICD LEAD REMOVAL N/A 03/02/2017   Procedure: ICD LEAD REMOVAL;  Surgeon: Evans Lance, MD;  Location: [place];  Service: Cardiovascular;  Laterality: N/A;  . IMPLANTABLE CARDIOVERTER DEFIBRILLATOR (ICD) GENERATOR CHANGE N/A 05/20/2011   Procedure: ICD GENERATOR CHANGE;  Surgeon: Evans Lance, MD;  Medtronic secure dual-chamber ICD serial number [id number]   . IR GASTROSTOMY TUBE MOD SED  04/19/2017  . KNEE ARTHROSCOPY     right; "just went in and scraped it"  . LEAD INSERTION N/A 03/02/2017   Procedure: LEAD INSERTION;  Surgeon: Evans Lance, MD;  Location: [place];  Service: Cardiovascular;  Laterality: N/A;  . MASS EXCISION Right 05/10/2013   Procedure: EXCISION MASS RIGHT THUMB;  Surgeon: Wynonia Sours, MD;  Location: [place];  Service: Orthopedics;  Laterality: Right;  . PROXIMAL INTERPHALANGEAL FUSION (PIP) Right 05/10/2013   Procedure: DEBRIDEMENT PROXIMAL INTERPHALANGEAL FUSION (PIP);  Surgeon: Wynonia Sours, MD;  Location: [place];  Service: Orthopedics;  Laterality: Right;  . TONSILLECTOMY           Family History  Problem Relation Age of Onset  . Tuberculosis Mother   . Tuberculosis Father   . Heart disease Brother   . Diabetes Sister   . Diabetes Brother   . Clotting disorder Brother      Social  History   Socioeconomic History  . Marital status: Divorced    Spouse name: Not on file  . Number of children: 2  . Years of education: Not on file  . Highest education level: Not on file  Occupational History  . Occupation: Sports coach: RETIRED  Social Needs  . Financial resource strain: Not on file  . Food insecurity:    Worry: Not on file    Inability: Not on file  . Transportation needs:    Medical: Not on file    Non-medical: Not on file  Tobacco Use  . Smoking status: Former Smoker    Packs/day: 2.00    Years: 30.00    Pack years: 60.00    Types: Cigarettes    Last attempt to quit: 06/23/1976    Years since quitting: 41.0  . Smokeless tobacco: Never Used  Substance and Sexual Activity  . Alcohol use: No  . Drug use: No  . Sexual activity: Never  Lifestyle  . Physical activity:    Days per week: Not on file    Minutes per session: Not on file  . Stress: Not on file  Relationships  . Social connections:    Talks on phone: Not on file    Gets together: Not on file    Attends religious service: Not on file    Active member of club or organization: Not on file    Attends meetings of clubs or organizations: Not on file    Relationship status: Not on file  . Intimate partner violence:    Fear of current or ex partner: Not on file    Emotionally abused: Not on file    Physically abused: Not on file    Forced sexual activity: Not on file  Other Topics Concern  . Not on file  Social History Narrative  . Not on file     BP 116/76   Pulse 74   Ht 6' (1.829 m)   Wt 152 lb (68.9 kg)   BMI 20.61 kg/m   Physical Exam:  Well appearing NAD HEENT: Unremarkable  Neck:  6 cm JVD, no thyromegally Lymphatics:  No adenopathy Back:  No CVA tenderness Lungs:  Clear with no wheezes HEART:  Regular rate rhythm, no murmurs, no rubs, no clicks Abd:  soft, positive bowel sounds, no organomegally, no rebound, no guarding Ext:  2 plus pulses, no edema, no  cyanosis, no clubbing Skin:  No rashes no nodules Neuro:  CN II through XII intact, motor grossly intact  EKG - atrial fib with a controlled VR  DEVICE  Normal device function.  See PaceArt for details.   Assess/Plan: 1. ICD - his device will be turned back on as per his direction. He will be set at 200/min. 2. Chronic systolic heart failure - his symptoms are class 2. His optivol looks good. He will continue his current meds. 3. Atrial fib - his rates are well controlled. We will follow. 4. CML - his counts have been stable. We will follow.  Mikle Bosworth.D.

## 2017-06-28 NOTE — Patient Outreach (Signed)
[facility]) Care Management  06/28/2017  [name] 1932-08-27 [id number]   CSW was able to make contact with patient today when CSW met with patient at [facility] [place], [place] where patient is residing to receive rehabilitative services.  CSW was only able to meet with patient briefly, as patient was in the process of leaving the facility to attend a physician appointment.  Patient reported that he will be transitioning into a long-term care bed until he decides whether or not he is able to go into an assisted living facility.  According to patient's attending nurse today, patient has almost completed his course of therapies (both physical and occupational) and will be moving into a long-term care bed.  The attending nurse did not make mention of whether or not patient would be going into an assisted living facility, she was only aware of patient being moved into a long-term care bed within the week.   CSW will perform a case closure on patient, as all goals of treatment have been met from social work standpoint and no additional social work needs have been identified at this time. CSW will fax an update to patient's Primary Care Physician, Dr. Alysia Penna to ensure that they are aware of CSW's involvement with patient's plan of care.   [name], BSW, MSW, LCSW  Licensed Education officer, [facility]  Mailing [place] [address], [place], [place] [address] Physical Address-[address], [place],  [address] Toll Free Main # [phone] Fax # [phone] Cell # [phone]  Office # [phone] [email or web address]

## 2017-06-28 NOTE — Patient Instructions (Addendum)
Medication Instructions:  Your physician recommends that you continue on your current medications as directed. Please refer to the Current Medication list given to you today.  Labwork: None ordered.  Testing/Procedures: None ordered.  Follow-Up:  Your physician wants you to follow-up in: 3 months with device clinic.    Your physician wants you to follow-up in: one year with Dr. Lovena Le.   You will receive a reminder letter in the mail two months in advance. If you don't receive a letter, please call our office to schedule the follow-up appointment.  Any Other Special Instructions Will Be Listed Below (If Applicable).  If you need a refill on your cardiac medications before your next appointment, please call your pharmacy.

## 2017-06-29 ENCOUNTER — Encounter: Payer: Self-pay | Admitting: Internal Medicine

## 2017-06-29 ENCOUNTER — Non-Acute Institutional Stay (SKILLED_NURSING_FACILITY): Payer: Medicare Other | Admitting: Internal Medicine

## 2017-06-29 DIAGNOSIS — I482 Chronic atrial fibrillation, unspecified: Secondary | ICD-10-CM

## 2017-06-29 DIAGNOSIS — I2782 Chronic pulmonary embolism: Secondary | ICD-10-CM | POA: Diagnosis not present

## 2017-06-29 DIAGNOSIS — I5022 Chronic systolic (congestive) heart failure: Secondary | ICD-10-CM

## 2017-06-29 DIAGNOSIS — R634 Abnormal weight loss: Secondary | ICD-10-CM | POA: Diagnosis not present

## 2017-06-29 DIAGNOSIS — J449 Chronic obstructive pulmonary disease, unspecified: Secondary | ICD-10-CM

## 2017-06-29 DIAGNOSIS — R627 Adult failure to thrive: Secondary | ICD-10-CM

## 2017-06-29 NOTE — Progress Notes (Signed)
Location:  [place] Room Number: 121 A Place of Service:  SNF (31) Provider:  DR Bennie Hind, MD  Patient Care Team: [name], MD as PCP - General (Family Medicine) [name], Peter M, MD as PCP - Cardiology (Cardiology) Heath Lark, MD as Consulting Physician (Hematology and Oncology)  Extended Emergency Contact Information Primary Emergency Contact: [place] Phone: [phone] Relation: Son  Code Status:  Full Code Goals of care: Advanced Directive information Advanced Directives 06/29/2017  Does Patient Have a Medical Advance Directive? No  Type of Advance Directive -  Does patient want to make changes to medical advance directive? -  Copy of [place] in Chart? -  Would patient like information on creating a medical advance directive? No - Patient declined  Pre-existing out of facility DNR order (yellow form or pink MOST form) -     Chief Complaint  Patient presents with  . Medical Management of Chronic Issues    1 month follow up    HPI:  Pt is a 84 y.o. male seen today for medical management of chronic diseases.  He has no concerns today. CNA reports he actually asked for food yesterday. He has less confusion. No falls. He failed swallowing test. He gets TF (Jevity 1.5 cal) via Peg. He is currently being treated for oral thrush with diflucan (stop date 06/30/17). He is a poor historian due to confusion/memory loss. Hx obtained from chart. He has dry mouth that is stable with OTC med  HTN - stable on lopressor 25 mg twice daily and lisinopril 2.5 mg daily  COPD/emphysema - stable on chronic [place] O2 at 2L/min as needed to keep sats >91%. Takes advair 250/50 twice daily;  spiriva 18 mcg daily; duoneb every 6 every 4 hours as needed   Chronic allergic rhinitis - stable on claritin daily as needed and flonase daily as needed  Polyneuropathy - controlled on neurontin 300 mg  nightly as needed  Gastroesophageal reflux disease  - stable on protonix 40 mg daily    Chronic constipation - controlled  Dysphagia with chronic aspiration - he is NPO and gets TF jevity.   Hx CKD - hx stage 3. Cr now nml at 0.71  Acute confusion 2/2 acute metabolic encephalopathy - mood stable on zyprexa 5 mg nightly. Plan to eventually slowly wean off pending mental status  FTT - weight down 6 lbs since last month and he has progressive wt loss. albumin 3.4. He gets TF  History of right lung PE - unchanged; he is not a candidate for anticoagulation therapy due to history of falls and subdural hematoma.     Chronic systolic heart failure -  EF 35-40%:; s/p AICD placement; stable on lasix 20 mg daily with k+ 10 meq daily;  lopressor 25 mg twice daily.   PAF - rate controlled on digoxin and lopressor; not a candidate for anticoagulations due to history of subdural hematoma and recurrent falls   CAD - s/p CABG and stent placements; stable on lopressor 25 mg twice daily and prn SLNTG.    Past Medical History:  Diagnosis Date  . Abnormal thyroid scan    Abnormal thyroid imaging studies from 11/09/2010, status post ultrasound guided fine needle aspiration of the dominant left inferior thyroid nodule on 12/15/2010. Cytology report showed rare follicular epithelial cells and hemosiderin laden macrophages.  . Adenomatous colon polyp   . AICD (automatic cardioverter/defibrillator) present  a. fx lead; a. s/p lead extraction 03/02/17  . Arthritis    "all over"  . Asthma   . Atrial fibrillation ([place])    on chronic Coumadin; stopped July 2013 due to subdural hematomas  . CHF (congestive heart failure) (HCC)    EF 35-40% s/p most recent ICD generator change-out with Medtronic dual-chamber ICD 05/20/11 with explantation of previous abdominally-implanted device  . Coronary artery disease    s/p CABG 1983 and PCI/stent 2004.   . Diabetes mellitus    diet controlled  . Diverticulosis   .  Dyslipidemia   . Enteritis   . Erythrocytosis   . GERD (gastroesophageal reflux disease)   . Hepatic steatosis   . Hypertension   . Ischemic cardiomyopathy    WITH CHF  . Monocytosis 04/17/2013  . Myocardial infarction ([place]) 1983; ~ 1990  . Pleural effusion    right  . Pneumonia August 2013  . Portal hypertensive gastropathy ([place])   . Rectal ulcer   . Renal calculi   . Subdural hematoma [facility]) July 2013   Anticoagulation stopped.   . SunDown syndrome   . VT (ventricular tachycardia) ([place])    Past Surgical History:  Procedure Laterality Date  . CHOLECYSTECTOMY    . COLONOSCOPY  07/07/2011   Procedure: COLONOSCOPY;  Surgeon: Jerene Bears, MD;  [facility];  Service: Gastroenterology;  Laterality: N/A;  . CORONARY ANGIOPLASTY WITH STENT PLACEMENT  2004   Tandem Cypher stents LAD  . CORONARY ARTERY BYPASS GRAFT  1983   SVG-mLAD  . ESOPHAGOGASTRODUODENOSCOPY  02/11/2011   Procedure: ESOPHAGOGASTRODUODENOSCOPY (EGD);  Surgeon: Beryle Beams, MD;  Location: [facility];  Service: Endoscopy;  Laterality: N/A;  . FLEXIBLE SIGMOIDOSCOPY N/A 02/08/2017   Procedure: FLEXIBLE SIGMOIDOSCOPY;  Surgeon: Irene Shipper, MD;  [facility];  Service: Endoscopy;  Laterality: N/A;  . ICD GENERATOR CHANGEOUT N/A 04/08/2016   Procedure: ICD Generator Changeout;  Surgeon: Evans Lance, MD;  Location: [place] CV LAB;  Service: Cardiovascular;  Laterality: N/A;  . ICD LEAD REMOVAL N/A 03/02/2017   Procedure: ICD LEAD REMOVAL;  Surgeon: Evans Lance, MD;  Location: [place];  Service: Cardiovascular;  Laterality: N/A;  . IMPLANTABLE CARDIOVERTER DEFIBRILLATOR (ICD) GENERATOR CHANGE N/A 05/20/2011   Procedure: ICD GENERATOR CHANGE;  Surgeon: Evans Lance, MD;  Medtronic secure dual-chamber ICD serial number [id number]   . IR GASTROSTOMY TUBE MOD SED  04/19/2017  . KNEE ARTHROSCOPY     right; "just went in and scraped it"  . LEAD INSERTION N/A 03/02/2017   Procedure: LEAD INSERTION;   Surgeon: Evans Lance, MD;  Location: [place];  Service: Cardiovascular;  Laterality: N/A;  . MASS EXCISION Right 05/10/2013   Procedure: EXCISION MASS RIGHT THUMB;  Surgeon: Wynonia Sours, MD;  Location: [place];  Service: Orthopedics;  Laterality: Right;  . PROXIMAL INTERPHALANGEAL FUSION (PIP) Right 05/10/2013   Procedure: DEBRIDEMENT PROXIMAL INTERPHALANGEAL FUSION (PIP);  Surgeon: Wynonia Sours, MD;  Location: [place];  Service: Orthopedics;  Laterality: Right;  . TONSILLECTOMY          Allergies  Allergen Reactions  . Tamsulosin Other (See Comments)    Dizziness, Made BP very low and weakness  . Celebrex [Celecoxib] Hives and Nausea And Vomiting    Gi upset  . Dronedarone Nausea And Vomiting and Other (See Comments)    GI upset, abdominal pain  . Esomeprazole Magnesium Hives and Other (See Comments)    "  don't really remember"  . Digoxin Diarrhea    May have caused some diarrhea  . Hydrocodone-Acetaminophen Other (See Comments)    Bad headache  . Protonix [Pantoprazole Sodium] Nausea And Vomiting and Other (See Comments)    Tolerates Dexilant    Outpatient Encounter Medications as of 06/29/2017  Medication Sig  . acetaminophen (TYLENOL) 650 MG suppository Place 1 suppository (650 mg total) rectally every 6 (six) hours as needed for fever or mild pain (T over 101).  . ADVAIR DISKUS 250-50 MCG/DOSE AEPB Inhale 1 puff into the lungs 2 (two) times daily.   . Artificial Saliva (BIOTENE MOISTURIZING MOUTH) SOLN Give 1 swab by mouth every 4 hours while awake  . digoxin (LANOXIN) 0.05 MG/ML solution Place 2.5 mLs (0.125 mg total) into feeding tube daily.  . fluconazole (DIFLUCAN) 200 MG tablet Take 200 mg by mouth daily.  . fluticasone (FLONASE) 50 MCG/ACT nasal spray Place 1 spray into both nostrils daily as needed for allergies.  . furosemide (LASIX) 10 MG/ML solution Give 2 ml (20mg ) via PEG - Tube daily  . gabapentin (NEURONTIN) 100 MG capsule Give  1 Capsule (100mg ) to 3 capsules (300mg ) enterally at bedtime as needed  . ipratropium (ATROVENT) 0.03 % nasal spray Place 1 spray into both nostrils 3 (three) times daily before meals.  Marland Kitchen ipratropium-albuterol (DUONEB) 0.5-2.5 (3) MG/3ML SOLN Take 3 mLs by nebulization every 4 (four) hours as needed.  . Lisinopril 1 MG/ML SOLN Place 2.5 mg into feeding tube daily.  Marland Kitchen loratadine (CLARITIN) 10 MG tablet Take 1 tablet (10 mg total) by mouth daily as needed for allergies.  Marland Kitchen menthol-cetylpyridinium (CEPACOL) 3 MG lozenge Take 1 lozenge by mouth every 2 (two) hours as needed for sore throat.  . metoprolol tartrate (LOPRESSOR) 25 mg/10 mL SUSP Place 10 mLs (25 mg total) into feeding tube 2 (two) times daily.  . nitroGLYCERIN (NITROSTAT) 0.4 MG SL tablet Place 1 tablet (0.4 mg total) under the tongue every 5 (five) minutes as needed for chest pain.  . Nutritional Supplements (FEEDING SUPPLEMENT, JEVITY 1.5 CAL,) LIQD Jevity 1.5 -  Give 240cc  Six times daily  . Nutritional Supplements (NUTRITIONAL SUPPLEMENT PO) NPO - nothing by mouth diet, Nothing by mouth texture HSG Puree diet - HSG Puree texture, thickened Fluids consistency  . ondansetron (ZOFRAN) 4 MG tablet Place 4 mg into feeding tube every 6 (six) hours as needed for nausea or vomiting.  . polyethylene glycol (MIRALAX / GLYCOLAX) packet Place 17 g into feeding tube daily.  . potassium chloride 20 MEQ/15ML (10%) SOLN Place 7.5 mLs (10 mEq total) into feeding tube daily.  . ranitidine (ZANTAC) 150 MG tablet Take 150 mg by mouth daily as needed for heartburn.  . RESTASIS 0.05 % ophthalmic emulsion INSTILL 1 DROP INTO BOTH EYES TWICE A DAY  . tiotropium (SPIRIVA) 18 MCG inhalation capsule Place 18 mcg into inhaler and inhale daily.  . traMADol (ULTRAM) 5 mg/mL SUSP Place 10-20 mLs (50-100 mg total) into feeding tube every 6 (six) hours as needed for moderate pain.  . Water For Irrigation, Sterile (FREE WATER) SOLN Place into feeding tube every 4  (four) hours. 150 ml H2o flushes via tube every 4 hours  . [DISCONTINUED] OLANZapine zydis (ZYPREXA) 5 MG disintegrating tablet Take 1 tablet (5 mg total) by mouth at bedtime. (Patient not taking: Reported on 06/29/2017)  . [DISCONTINUED] pantoprazole sodium (PROTONIX) 40 mg/20 mL PACK Place 20 mLs (40 mg total) into feeding tube daily. (Patient not taking: Reported  on 06/29/2017)   No facility-administered encounter medications on file as of 06/29/2017.     Review of Systems  Unable to perform ROS: Other (memory loss/confusion)    Immunization History  Administered Date(s) Administered  . Influenza, High Dose Seasonal PF 10/05/2016  . Influenza-Unspecified 10/12/2011  . Tdap 12/20/2016, 04/01/2017   Pertinent  Health Maintenance Due  Topic Date Due  . INFLUENZA VACCINE  08/31/2017  . PNA vac Low Risk Adult  Discontinued   Fall Risk  05/02/2017 03/09/2017 12/20/2016  Falls in the past year? Yes Yes No  Number falls in past yr: 2 or more 1 -  Injury with Fall? Yes No -  Risk Factor Category  High Fall Risk - -  Risk for fall due to : History of fall(s);Impaired balance/gait;Impaired mobility History of fall(s);Impaired balance/gait;Impaired mobility -  Follow up Education provided;Falls prevention discussed Falls prevention discussed -   Functional Status Survey:    Vitals:   06/29/17 1104  BP: (!) 148/80  Pulse: 76  Resp: 20  Temp: (!) 97.4 F (36.3 C)  SpO2: 96%  Weight: 153 lb 3.2 oz (69.5 kg)  Height: 6\' 1"  (1.854 m)   Body mass index is 20.21 kg/m. Physical Exam  Constitutional: He appears well-developed.  Frail appearing in NAD sitting up in bed  HENT:  (+) oral thrush; MM dry  Eyes: Pupils are equal, round, and reactive to light. No scleral icterus.  Neck: Neck supple. Carotid bruit is not present. No thyromegaly present.  Cardiovascular: Normal rate and intact distal pulses. An irregularly irregular rhythm present. Exam reveals no gallop and no friction rub.    Murmur heard.  Systolic murmur is present with a grade of 1/6. No LE edema b/l; no calf TTP  Pulmonary/Chest: Effort normal and breath sounds normal. He has no wheezes. He has no rales. He exhibits no tenderness.  Abdominal: Soft. Bowel sounds are normal. He exhibits no distension, no abdominal bruit, no pulsatile midline mass and no mass. There is no hepatomegaly. There is tenderness (general). There is no rebound and no guarding.  Peg tube intact; clamped; no redness/ d/c at insertion site  Musculoskeletal: He exhibits edema and tenderness.  B/l paravertebral lumbar-thoracic muscle hypertrophy with ropy tissue texture changes  Lymphadenopathy:    He has no cervical adenopathy.  Neurological: He is alert. He has normal reflexes.  Skin: Skin is warm and dry. No rash noted.  Psychiatric: He has a normal mood and affect. His behavior is normal. Thought content normal.    Labs reviewed: Recent Labs    04/21/17 0538  04/23/17 0655 04/24/17 0646 04/25/17 0559 04/26/17 0623 05/02/17  NA 136  --  137 138  --   --  143  K 4.0  --  3.9 4.1  --   --  4.4  CL 102  --  103 100*  --   --   --   CO2 23  --  25 25  --   --   --   GLUCOSE 111*  --  165* 147*  --   --   --   BUN 16  --  15 15  --   --  22*  CREATININE 0.86  --  0.85 0.81  --   --  0.7  CALCIUM 8.4*  --  8.6* 8.6*  --   --   --   MG 2.2   < > 1.7 2.1 1.9 1.9  --    < > =  values in this interval not displayed.   Recent Labs    04/03/17 0836 04/04/17 0449 04/07/17 0236 05/02/17  AST 35  33 24 33 17  ALT 11*  13* 11* 16* 8*  ALKPHOS 55  49 46 50 132*  BILITOT 2.6*  2.7* 1.6* 2.5*  --   PROT 6.4*  6.3* 5.9* 6.2*  --   ALBUMIN 3.6  3.4* 3.1* 3.2*  --    Recent Labs    04/07/17 0236  04/21/17 0538 04/23/17 0655 05/02/17 06/16/17 0931  WBC 9.5   < > 8.5 7.1 7.1 8.7  NEUTROABS 5.4  --   --   --  4 3.4  HGB 13.4   < > 11.9* 11.6* 11.8* 13.9  HCT 41.9   < > 37.2* 36.4* 34* 42.2  MCV 94.2   < > 93.9 94.5  --  91.3   PLT 101*   < > 235 252 223 179   < > = values in this interval not displayed.   Lab Results  Component Value Date   TSH 0.91 05/02/2017   Lab Results  Component Value Date   HGBA1C 5.6 08/18/2011   Lab Results  Component Value Date   CHOL 115 08/18/2011   HDL 33 (L) 08/18/2011   LDLCALC 60 08/18/2011   TRIG 112 08/18/2011   CHOLHDL 3.5 08/18/2011    Significant Diagnostic Results in last 30 days:  Dg Op Swallowing Func-medicare/speech Path  Result Date: 06/19/2017 Objective Swallowing Evaluation: Type of Study: MBS-Modified Barium Swallow Study  Patient Details Name: FATIH STALVEY MRN: [id number] Date of Birth: [birth year] Today's Date: 06/19/2017 Time: SLP Start Time (ACUTE ONLY): 1339 -SLP Stop Time (ACUTE ONLY): 1415 SLP Time Calculation (min) (ACUTE ONLY): 36 min Past Medical History: Past Medical History: Diagnosis Date . Abnormal thyroid scan   Abnormal thyroid imaging studies from 11/09/2010, status post ultrasound guided fine needle aspiration of the dominant left inferior thyroid nodule on 12/15/2010. Cytology report showed rare follicular epithelial cells and hemosiderin laden macrophages. . Adenomatous colon polyp  . AICD (automatic cardioverter/defibrillator) present   a. fx lead; a. s/p lead extraction 03/02/17 . Arthritis   "all over" . Asthma  . Atrial fibrillation ([place])   on chronic Coumadin; stopped July 2013 due to subdural hematomas . CHF (congestive heart failure) (HCC)   EF 35-40% s/p most recent ICD generator change-out with Medtronic dual-chamber ICD 05/20/11 with explantation of previous abdominally-implanted device . Coronary artery disease   s/p CABG 1983 and PCI/stent 2004.  . Diabetes mellitus   diet controlled . Diverticulosis  . Dyslipidemia  . Enteritis  . Erythrocytosis  . GERD (gastroesophageal reflux disease)  . Hepatic steatosis  . Hypertension  . Ischemic cardiomyopathy   WITH CHF . Monocytosis 04/17/2013 . Myocardial infarction ([place]) 1983; ~ 1990 . Pleural  effusion   right . Pneumonia August 2013 . Portal hypertensive gastropathy ([place])  . Rectal ulcer  . Renal calculi  . Subdural hematoma [facility]) July 2013  Anticoagulation stopped.  . SunDown syndrome  . VT (ventricular tachycardia) ([place])  Past Surgical History: Past Surgical History: Procedure Laterality Date . CHOLECYSTECTOMY   . COLONOSCOPY  07/07/2011  Procedure: COLONOSCOPY;  Surgeon: Jerene Bears, MD;  [facility];  Service: Gastroenterology;  Laterality: N/A; . CORONARY ANGIOPLASTY WITH STENT PLACEMENT  2004  Tandem Cypher stents LAD . CORONARY ARTERY BYPASS GRAFT  1983  SVG-mLAD . ESOPHAGOGASTRODUODENOSCOPY  02/11/2011  Procedure: ESOPHAGOGASTRODUODENOSCOPY (EGD);  Surgeon: Beryle Beams, MD;  [facility];  Service: Endoscopy;  Laterality: N/A; . FLEXIBLE SIGMOIDOSCOPY N/A 02/08/2017  Procedure: FLEXIBLE SIGMOIDOSCOPY;  Surgeon: Irene Shipper, MD;  [facility];  Service: Endoscopy;  Laterality: N/A; . ICD GENERATOR CHANGEOUT N/A 04/08/2016  Procedure: ICD Generator Changeout;  Surgeon: Evans Lance, MD;  Location: [place] CV LAB;  Service: Cardiovascular;  Laterality: N/A; . ICD LEAD REMOVAL N/A 03/02/2017  Procedure: ICD LEAD REMOVAL;  Surgeon: Evans Lance, MD;  Location: [place];  Service: Cardiovascular;  Laterality: N/A; . IMPLANTABLE CARDIOVERTER DEFIBRILLATOR (ICD) GENERATOR CHANGE N/A 05/20/2011  Procedure: ICD GENERATOR CHANGE;  Surgeon: Evans Lance, MD;  Medtronic secure dual-chamber ICD serial number [id number]  . IR GASTROSTOMY TUBE MOD SED  04/19/2017 . KNEE ARTHROSCOPY    right; "just went in and scraped it" . LEAD INSERTION N/A 03/02/2017  Procedure: LEAD INSERTION;  Surgeon: Evans Lance, MD;  Location: [place];  Service: Cardiovascular;  Laterality: N/A; . MASS EXCISION Right 05/10/2013  Procedure: EXCISION MASS RIGHT THUMB;  Surgeon: Wynonia Sours, MD;  Location: [place];  Service: Orthopedics;  Laterality: Right; . PROXIMAL INTERPHALANGEAL  FUSION (PIP) Right 05/10/2013  Procedure: DEBRIDEMENT PROXIMAL INTERPHALANGEAL FUSION (PIP);  Surgeon: Wynonia Sours, MD;  Location: [place];  Service: Orthopedics;  Laterality: Right; . TONSILLECTOMY      HPI: 84 yo with h/o asthma, COPD, CHF, pna, SDH, sundown syndrome, VT, DM, Afib, GERD, ICD.  PSH + for EGD 02/11/2011.  Pt has has several prior MBS studies.  Pt underwent 04/10/2017 when in hospital with fever, congseted cough and had right lower lobe infiltrate. Moderate pharyngeal deficits with penetration and aspiration of all consistencies due to weakness and decreased timing of protective mechanisms.  Recommendation was made for npo and pt received PEG.  He also had a FEES study 05/06/2017 with recommendation for puree/nectar - silent penetration of thin, residuals clearing with dry swallows.  He has PEG for nutrition - and receives pleasure feeding of puree/nectar - 25% of intake.  Pt reports to SNF SlP inability to swallow and states food is getting "stuck" and he has frequent expectoration. Pt with xerostomia today and was provided with oral moisture and water which produced a cough immediate post-swallow.   Subjective: pt awake in bed Assessment / Plan / Recommendation CHL IP CLINICAL IMPRESSIONS 06/19/2017 Clinical Impression Pt presents with mild oral and severe pharyngeal dysphagia with impaired musculature contraction/laryngeal elevation resulting in gross residuals and mild aspiration of thin. First bolus of tsp thin barium was all retained in pharynx!  Residuals much worse with increased viscocity.  Patient was able to "hock" to mostly clear vallecular/pyriform sinus residuals. Chin tuck postures and head turn did not aid pharyngeal clearance.  Multiple dry swallows minimally helpful but nearly 50% of boluses remained retained in pharynx.  At this time, pt is high malnutrition risk with po only due to level of dysphagia.  Pt does report h/o coughing and expectorating with intake -  causing SLP to question chronic issues.   Recommend pt continue po and dysphagia treatment with SLP. Once compensation strategies are generalized, suspect pt may tolerate comfort po intake independently.  As his strong "hock" is protective of his airway, suspect care plan may be able to be more liberal.  Anticipate however that pt will continue to require PEG tube for nutrition.   SLP Visit Diagnosis Dysphagia, oropharyngeal phase (R13.12);Dysphagia, pharyngoesophageal phase (R13.14) Attention and concentration deficit following -- Frontal lobe and executive  function deficit following -- Impact on safety and function Severe aspiration risk;Risk for inadequate nutrition/hydration   CHL IP TREATMENT RECOMMENDATION 04/10/2017 Treatment Recommendations Therapy as outlined in treatment plan below   Prognosis 04/10/2017 Prognosis for Safe Diet Advancement (No Data) Barriers to Reach Goals -- Barriers/Prognosis Comment -- CHL IP DIET RECOMMENDATION 06/19/2017 SLP Diet Recommendations Nectar thick liquid;Thin liquid Liquid Administration via Cup Medication Administration Via alternative means Compensations Slow rate;Small sips/bites;Clear throat intermittently;Effortful swallow Postural Changes Remain semi-upright after after feeds/meals (Comment);Seated upright at 90 degrees   CHL IP OTHER RECOMMENDATIONS 06/19/2017 Recommended Consults -- Oral Care Recommendations Oral care QID Other Recommendations --   CHL IP FOLLOW UP RECOMMENDATIONS 04/26/2017 Follow up Recommendations Skilled Nursing facility   [facility] IP FREQUENCY AND DURATION 04/10/2017 Speech Therapy Frequency (ACUTE ONLY) min 2x/week Treatment Duration 2 weeks      CHL IP ORAL PHASE 06/19/2017 Oral Phase Impaired Oral - Pudding Teaspoon -- Oral - Pudding Cup -- Oral - Honey Teaspoon -- Oral - Honey Cup -- Oral - Nectar Teaspoon WFL Oral - Nectar Cup WFL Oral - Nectar Straw -- Oral - Thin Teaspoon WFL Oral - Thin Cup WFL Oral - Thin Straw WFL Oral - Puree WFL Oral - Mech  Soft -- Oral - Regular -- Oral - Multi-Consistency -- Oral - Pill -- Oral Phase - Comment --  CHL IP PHARYNGEAL PHASE 06/19/2017 Pharyngeal Phase Impaired Pharyngeal- Pudding Teaspoon -- Pharyngeal -- Pharyngeal- Pudding Cup -- Pharyngeal -- Pharyngeal- Honey Teaspoon -- Pharyngeal -- Pharyngeal- Honey Cup -- Pharyngeal -- Pharyngeal- Nectar Teaspoon Reduced pharyngeal peristalsis;Reduced epiglottic inversion;Reduced laryngeal elevation;Reduced airway/laryngeal closure;Reduced tongue base retraction;Pharyngeal residue - valleculae;Pharyngeal residue - pyriform Pharyngeal -- Pharyngeal- Nectar Cup Delayed swallow initiation-pyriform sinuses;Reduced pharyngeal peristalsis;Reduced epiglottic inversion;Reduced laryngeal elevation;Reduced airway/laryngeal closure;Reduced tongue base retraction;Penetration/Aspiration during swallow;Penetration/Apiration after swallow;Pharyngeal residue - valleculae;Pharyngeal residue - pyriform;Pharyngeal residue - cp segment Pharyngeal Material enters airway, CONTACTS cords and not ejected out Pharyngeal- Nectar Straw Reduced pharyngeal peristalsis;Reduced epiglottic inversion;Reduced laryngeal elevation;Reduced airway/laryngeal closure;Reduced tongue base retraction;Pharyngeal residue - valleculae;Pharyngeal residue - pyriform Pharyngeal -- Pharyngeal- Thin Teaspoon Reduced pharyngeal peristalsis;Reduced epiglottic inversion;Reduced laryngeal elevation;Reduced airway/laryngeal closure;Reduced tongue base retraction;Pharyngeal residue - valleculae;Pharyngeal residue - pyriform Pharyngeal -- Pharyngeal- Thin Cup Reduced pharyngeal peristalsis;Reduced epiglottic inversion;Reduced laryngeal elevation;Reduced airway/laryngeal closure;Reduced tongue base retraction;Penetration/Aspiration during swallow;Penetration/Apiration after swallow;Moderate aspiration;Pharyngeal residue - valleculae;Pharyngeal residue - pyriform Pharyngeal Material enters airway, passes BELOW cords without attempt by  patient to eject out (silent aspiration) Pharyngeal- Thin Straw Reduced pharyngeal peristalsis;Reduced epiglottic inversion;Reduced laryngeal elevation;Reduced airway/laryngeal closure;Reduced tongue base retraction;Pharyngeal residue - valleculae;Pharyngeal residue - pyriform Pharyngeal -- Pharyngeal- Puree Reduced pharyngeal peristalsis;Reduced epiglottic inversion;Reduced laryngeal elevation;Reduced airway/laryngeal closure;Reduced tongue base retraction;Pharyngeal residue - valleculae;Pharyngeal residue - pyriform;Pharyngeal residue - cp segment Pharyngeal -- Pharyngeal- Mechanical Soft -- Pharyngeal -- Pharyngeal- Regular -- Pharyngeal -- Pharyngeal- Multi-consistency -- Pharyngeal -- Pharyngeal- Pill -- Pharyngeal -- Pharyngeal Comment chin tuck, head turn left or right did not decrease residuals, pt able to "hock" and expectorate barium residuals from pharynx  CH[facility] IP CERVICAL ESOPHAGEAL PHASE 06/19/2017 Cervical Esophageal Phase Impaired Pudding Teaspoon -- Pudding Cup -- Honey Teaspoon -- Honey Cup -- Nectar Teaspoon -- Nectar Cup -- Nectar Straw -- Thin Teaspoon -- Thin Cup -- Thin Straw -- Puree -- Mechanical Soft -- Regular -- Multi-consistency -- Pill -- Cervical Esophageal Comment pt with very poor clearance of barium through [facility] CHL IP GO 02/07/2014 Functional Assessment Tool Used mbs. clinical judgement Functional Limitations Swallowing Swallow Current Status (Z1696) CI Swallow Goal Status (V8938) CI  Swallow Discharge Status [phone]) CI Motor Speech Current Status [phone]) (None) Motor Speech Goal Status [phone]) (None) Motor Speech Goal Status [phone]) (None) Spoken Language Comprehension Current Status [phone]) (None) Spoken Language Comprehension Goal Status (V7846) (None) Spoken Language Comprehension Discharge Status [phone]) (None) Spoken Language Expression Current Status [phone]) (None) Spoken Language Expression Goal Status [phone]) (None) Spoken Language Expression Discharge Status [phone]) (None)  Attention Current Status (D6644) (None) Attention Goal Status (I3474) (None) Attention Discharge Status [phone]) (None) Memory Current Status (L8756) (None) Memory Goal Status (E3329) (None) Memory Discharge Status (J1884) (None) Voice Current Status (Z6606) (None) Voice Goal Status (T0160) (None) Voice Discharge Status (F0932) (None) Other Speech-Language Pathology Functional Limitation Current Status (T5573) (None) Other Speech-Language Pathology Functional Limitation Goal Status (U2025) (None) Other Speech-Language Pathology Functional Limitation Discharge Status [phone]) (None) Macario Golds 06/19/2017, 6:00 PM            CLINICAL DATA:  Dysphagia. EXAM: MODIFIED BARIUM SWALLOW TECHNIQUE: Different consistencies of barium were administered orally to the patient by the Speech Pathologist. Imaging of the pharynx was performed in the lateral projection. FLUOROSCOPY TIME:  Fluoroscopy Time:  2 minutes, 0 seconds Radiation Exposure Index (if provided by the fluoroscopic device): 5.7 mGy Number of Acquired Spot Images: 0 COMPARISON:  None. FINDINGS: Thin liquid: Solid penetration and mild aspiration with vallecular and piriform sinus retention. Good clearing on command, but chin tuck and head turn maneuvers did not help. Nectar thick liquid: Premature spill with laryngeal penetration to the cords and retention. Puree consistency: Penetration and retention. IMPRESSION: 1. Silent penetration and mild aspiration with significant vallecular and piriform retention as detailed above. Please refer to the Speech Pathologists report for complete details and recommendations. Electronically Signed   By: Van Clines M.D.   On: 06/19/2017 15:02    Assessment/Plan   ICD-10-CM   1. Weight loss R63.4   2. Adult failure to thrive R62.7   3. Chronic atrial fibrillation (HCC) I48.2   4. Chronic systolic heart failure (HCC) I50.22   5. Chronic obstructive pulmonary disease, unspecified COPD type ([place]) J44.9   6. Other  chronic pulmonary embolism without acute cor pulmonale (HCC) I27.82     Cont current meds as followed  Psych services to follow  PT/OT/ST as ordered  Cont TF as ordered - Peg tube care as indicated  F/u with GI and other specialists as scheduled  Will follow  Labs/tests ordered: none   Devyon Keator S. Perlie Gold  [facility] [address] [place],  [address] [phone] Cell (Monday-Friday 8 AM - 5 PM) [phone] After 5 PM and follow prompts

## 2017-07-03 ENCOUNTER — Encounter: Payer: Self-pay | Admitting: Adult Health

## 2017-07-03 ENCOUNTER — Non-Acute Institutional Stay (SKILLED_NURSING_FACILITY): Payer: Medicare Other | Admitting: Adult Health

## 2017-07-03 ENCOUNTER — Other Ambulatory Visit: Payer: Self-pay

## 2017-07-03 DIAGNOSIS — R1314 Dysphagia, pharyngoesophageal phase: Secondary | ICD-10-CM | POA: Diagnosis not present

## 2017-07-03 DIAGNOSIS — K219 Gastro-esophageal reflux disease without esophagitis: Secondary | ICD-10-CM

## 2017-07-03 MED ORDER — TRAMADOL HCL 50 MG PO TABS: 100.0000 mg | ORAL_TABLET | Freq: Four times a day (QID) | ORAL | 0 refills | Status: DC | PRN

## 2017-07-03 NOTE — Progress Notes (Signed)
Location:   [facility] Room Number: 121 A Place of Service:  SNF (31)   CODE STATUS: Full Code  Allergies  Allergen Reactions  . Tamsulosin Other (See Comments)    Dizziness, Made BP very low and weakness  . Celebrex [Celecoxib] Hives and Nausea And Vomiting    Gi upset  . Dronedarone Nausea And Vomiting and Other (See Comments)    GI upset, abdominal pain  . Esomeprazole Magnesium Hives and Other (See Comments)    "don't really remember"  . Digoxin Diarrhea    May have caused some diarrhea  . Hydrocodone-Acetaminophen Other (See Comments)    Bad headache  . Protonix [Pantoprazole Sodium] Nausea And Vomiting and Other (See Comments)    Tolerates Dexilant    Chief Complaint  Patient presents with  . Acute Visit    Abdominal Pain    HPI:  He is complaining of abdominal pain. He denies any nausea or vomiting. States the pain is achy; denies any cramping. He states he is having frequent stools. He tells me that he has increased sputum present. He states he still cannot get food down. There are no reports of fevers present. No reports of aspiration present.   Past Medical History:  Diagnosis Date  . Abnormal thyroid scan    Abnormal thyroid imaging studies from 11/09/2010, status post ultrasound guided fine needle aspiration of the dominant left inferior thyroid nodule on 12/15/2010. Cytology report showed rare follicular epithelial cells and hemosiderin laden macrophages.  . Adenomatous colon polyp   . AICD (automatic cardioverter/defibrillator) present    a. fx lead; a. s/p lead extraction 03/02/17  . Arthritis    "all over"  . Asthma   . Atrial fibrillation ([place])    on chronic Coumadin; stopped July 2013 due to subdural hematomas  . CHF (congestive heart failure) (HCC)    EF 35-40% s/p most recent ICD generator change-out with Medtronic dual-chamber ICD 05/20/11 with explantation of previous abdominally-implanted device  . Coronary artery disease    s/p CABG 1983 and PCI/stent 2004.   . Diabetes mellitus    diet controlled  . Diverticulosis   . Dyslipidemia   . Enteritis   . Erythrocytosis   . GERD (gastroesophageal reflux disease)   . Hepatic steatosis   . Hypertension   . Ischemic cardiomyopathy    WITH CHF  . Monocytosis 04/17/2013  . Myocardial infarction ([place]) 1983; ~ 1990  . Pleural effusion    right  . Pneumonia August 2013  . Portal hypertensive gastropathy ([place])   . Rectal ulcer   . Renal calculi   . Subdural hematoma [facility]) July 2013   Anticoagulation stopped.   . SunDown syndrome   . VT (ventricular tachycardia) ([place])     Past Surgical History:  Procedure Laterality Date  . CHOLECYSTECTOMY    . COLONOSCOPY  07/07/2011   Procedure: COLONOSCOPY;  Surgeon: Jerene Bears, MD;  Location: [facility];  Service: Gastroenterology;  Laterality: N/A;  . CORONARY ANGIOPLASTY WITH STENT PLACEMENT  2004   Tandem Cypher stents LAD  . CORONARY ARTERY BYPASS GRAFT  1983   SVG-mLAD  . ESOPHAGOGASTRODUODENOSCOPY  02/11/2011   Procedure: ESOPHAGOGASTRODUODENOSCOPY (EGD);  Surgeon: Beryle Beams, MD;  Location: [facility];  Service: Endoscopy;  Laterality: N/A;  . FLEXIBLE SIGMOIDOSCOPY N/A 02/08/2017   Procedure: FLEXIBLE SIGMOIDOSCOPY;  Surgeon: Irene Shipper, MD;  Location: [facility];  Service: Endoscopy;  Laterality: N/A;  . [place] N/A 04/08/2016   Procedure:  ICD Generator Changeout;  Surgeon: Evans Lance, MD;  Location: [place] CV LAB;  Service: Cardiovascular;  Laterality: N/A;  . ICD LEAD REMOVAL N/A 03/02/2017   Procedure: ICD LEAD REMOVAL;  Surgeon: Evans Lance, MD;  Location: [place];  Service: Cardiovascular;  Laterality: N/A;  . IMPLANTABLE CARDIOVERTER DEFIBRILLATOR (ICD) GENERATOR CHANGE N/A 05/20/2011   Procedure: ICD GENERATOR CHANGE;  Surgeon: Evans Lance, MD;  Medtronic secure dual-chamber ICD serial number [id number]   . IR GASTROSTOMY TUBE MOD SED  04/19/2017  . KNEE ARTHROSCOPY       right; "just went in and scraped it"  . LEAD INSERTION N/A 03/02/2017   Procedure: LEAD INSERTION;  Surgeon: Evans Lance, MD;  Location: [place];  Service: Cardiovascular;  Laterality: N/A;  . MASS EXCISION Right 05/10/2013   Procedure: EXCISION MASS RIGHT THUMB;  Surgeon: Wynonia Sours, MD;  Location: [place];  Service: Orthopedics;  Laterality: Right;  . PROXIMAL INTERPHALANGEAL FUSION (PIP) Right 05/10/2013   Procedure: DEBRIDEMENT PROXIMAL INTERPHALANGEAL FUSION (PIP);  Surgeon: Wynonia Sours, MD;  Location: [place];  Service: Orthopedics;  Laterality: Right;  . TONSILLECTOMY          Social History   Socioeconomic History  . Marital status: Divorced    Spouse name: Not on file  . Number of children: 2  . Years of education: Not on file  . Highest education level: Not on file  Occupational History  . Occupation: Sports coach: RETIRED  Social Needs  . Financial resource strain: Not on file  . Food insecurity:    Worry: Not on file    Inability: Not on file  . Transportation needs:    Medical: Not on file    Non-medical: Not on file  Tobacco Use  . Smoking status: Former Smoker    Packs/day: 2.00    Years: 30.00    Pack years: 60.00    Types: Cigarettes    Last attempt to quit: 06/23/1976    Years since quitting: 41.0  . Smokeless tobacco: Never Used  Substance and Sexual Activity  . Alcohol use: No  . Drug use: No  . Sexual activity: Never  Lifestyle  . Physical activity:    Days per week: Not on file    Minutes per session: Not on file  . Stress: Not on file  Relationships  . Social connections:    Talks on phone: Not on file    Gets together: Not on file    Attends religious service: Not on file    Active member of club or organization: Not on file    Attends meetings of clubs or organizations: Not on file    Relationship status: Not on file  . Intimate partner violence:    Fear of current or ex partner: Not  on file    Emotionally abused: Not on file    Physically abused: Not on file    Forced sexual activity: Not on file  Other Topics Concern  . Not on file  Social History Narrative  . Not on file   Family History  Problem Relation Age of Onset  . Tuberculosis Mother   . Tuberculosis Father   . Heart disease Brother   . Diabetes Sister   . Diabetes Brother   . Clotting disorder Brother       VITAL SIGNS BP 113/71   Pulse 76   Temp (!) 97.4 F (36.3  C)   Resp 20   Ht 6' 1" (1.854 m)   Wt 153 lb 3.2 oz (69.5 kg)   SpO2 96%   BMI 20.21 kg/m   Outpatient Encounter Medications as of 07/03/2017  Medication Sig  . acetaminophen (TYLENOL) 650 MG suppository Place 1 suppository (650 mg total) rectally every 6 (six) hours as needed for fever or mild pain (T over 101).  . ADVAIR DISKUS 250-50 MCG/DOSE AEPB Inhale 1 puff into the lungs 2 (two) times daily.   . Artificial Saliva (BIOTENE MOISTURIZING MOUTH) SOLN Give 1 swab by mouth every 4 hours while awake  . digoxin (LANOXIN) 0.05 MG/ML solution Place 2.5 mLs (0.125 mg total) into feeding tube daily.  . fluticasone (FLONASE) 50 MCG/ACT nasal spray Place 1 spray into both nostrils daily as needed for allergies.  . furosemide (LASIX) 10 MG/ML solution Give 2 ml (53m) via PEG - Tube daily  . gabapentin (NEURONTIN) 100 MG capsule Give 1 Capsule (1023m to 3 capsules (30058menterally at bedtime as needed  . ipratropium (ATROVENT) 0.03 % nasal spray Place 1 spray into both nostrils 3 (three) times daily before meals.  . iMarland Kitchenratropium-albuterol (DUONEB) 0.5-2.5 (3) MG/3ML SOLN Take 3 mLs by nebulization every 4 (four) hours as needed.  . Lisinopril 1 MG/ML SOLN Place 2.5 mg into feeding tube daily.  . lMarland Kitchenratadine (CLARITIN) 10 MG tablet Take 1 tablet (10 mg total) by mouth daily as needed for allergies.  . mMarland Kitchennthol-cetylpyridinium (CEPACOL) 3 MG lozenge Take 1 lozenge by mouth every 2 (two) hours as needed for sore throat.  . metoprolol  tartrate (LOPRESSOR) 25 mg/10 mL SUSP Place 10 mLs (25 mg total) into feeding tube 2 (two) times daily.  . nitroGLYCERIN (NITROSTAT) 0.4 MG SL tablet Place 1 tablet (0.4 mg total) under the tongue every 5 (five) minutes as needed for chest pain.  . Nutritional Supplements (FEEDING SUPPLEMENT, JEVITY 1.5 CAL,) LIQD Jevity 1.5 -  Give 240cc  Six times daily  . Nutritional Supplements (NUTRITIONAL SUPPLEMENT PO) NPO - nothing by mouth diet, Nothing by mouth texture HSG Puree diet - HSG Puree texture, thickened Fluids consistency  . ondansetron (ZOFRAN) 4 MG tablet Place 4 mg into feeding tube every 6 (six) hours as needed for nausea or vomiting.  . polyethylene glycol (MIRALAX / GLYCOLAX) packet Place 17 g into feeding tube daily.  . potassium chloride 20 MEQ/15ML (10%) SOLN Place 7.5 mLs (10 mEq total) into feeding tube daily.  . ranitidine (ZANTAC) 150 MG tablet Take 150 mg by mouth daily as needed for heartburn.  . RESTASIS 0.05 % ophthalmic emulsion INSTILL 1 DROP INTO BOTH EYES TWICE A DAY  . tiotropium (SPIRIVA) 18 MCG inhalation capsule Place 18 mcg into inhaler and inhale daily.  . traMADol (ULTRAM) 50 MG tablet Take 2 tablets (100 mg total) by mouth every 6 (six) hours as needed.  . Water For Irrigation, Sterile (FREE WATER) SOLN Place into feeding tube every 4 (four) hours. 150 ml H2o flushes via tube every 4 hours   No facility-administered encounter medications on file as of 07/03/2017.      SIGNIFICANT DIAGNOSTIC EXAMS  PREVIOUS:   03-26-17: ct of chest abdomen and pelvis: 1. No acute/traumatic intrathoracic, abdominal, or pelvic pathology. 2. Nonocclusive pulmonary embolus in the subsegmental right lower lobe pulmonary artery branches likely old PE or scarring. Nonocclusive acute PE is not entirely excluded. Clinical correlation is recommended. 3. Mild cardiomegaly with dilatation of the right atrium. 4. Postsurgical changes of resection of  the right upper lobe mass. Small right  pleural effusion and stable right lung base round atelectasis/scarring. 5. Aortic Atherosclerosis  and Emphysema    04-07-17: chest x-ray: Suspected patchy infiltrate at the right base, short interval radiographic follow-up suggested to ensure clearing. Cardiomegaly.  04-09-17: chest x-ray: COPD, cardiomegaly, stable. Increasing right basilar airspace opacity and small right effusion concerning for pneumonia  04-10-17: swallow study: Recommend NPO with short term alternative nutrition and continued ST   04-19-17: PEG tube insertion   05-01-17: chest x-ray: mild right basilar infiltrate with trace right sided effusion  TODAY:   05-23-17: chest x-ray: COPD changes with mild atelectasis and small pleural effusion versus pleural thickening at RIGHT base. Improved RIGHT basilar infiltrates since previous exam.  06-19-17: swallow study: Recommend pt continue po and dysphagia treatment with SLP. Once compensation strategies are generalized, suspect pt may tolerate comfort po intake independently. As his strong "hock" is protective of his airway, suspect care plan may be able to be more liberal. Anticipate however that pt will continue to require PEG tube for nutrition.      LABS REVIEWED: PREVIOUS:   04-07-17: wbc 9.5; hgb 13.4; hct 41.9; mcv 94.2; plt 101; glucose 84; bun 18; creat 1.28 ;k+ 4.0; na++ 136; ca 8.2; total bili 2.5; albumin 3.2; rapid flu: +A; blood culture: no growth; urine culture: <10,000 colonies 04-09-17: wbc 9.0; hgb 11.5; hct 35.1; mcv 91.9; plt 120; glucose 88; bun 9; creat 0.86; k+ 3.5; na++ 133; ca 7.5; mag 1.7 04-13-17: wbc 9.4; hgb 13.8; hct 42.6; mcv 94.0; plt 217; glucose 115; bun 11; creat 1.04 ;k+ 3.3; na++144; ca 8.7 04-16-17: wbc 6.5; hgb 12.2; hct 38.5; mcv 95.3; plt 185; glucose 115; bun 11; creat 0.78; k+ 4.0; na++ 143; ca 8.9; mag 1.7  04-20-17:wbc 12.4; hgb 12.2; hct 38.0; mcv 93.6; plt 209; glucose 110; bun 18; creat 0.95; k+ 4.7; na++ 136; ca 8.4; mag 2.7;  04-22-17:  mag 1.9 05-02-17: wbc 7.1; hgb 11.8; hct 34.0; mcv 87.6; plt 223; glucose 130; bun 22.2; creat 0.71; k+ 4.4; na++ 143; ca 9.1; alk phos 123; albumin 3.4; tsh 0.91   NO NEW LABS   Review of Systems  Reason unable to perform ROS: poor historian   Constitutional: Negative for malaise/fatigue.  Respiratory: Positive for cough. Negative for shortness of breath.        Clear sputum production   Cardiovascular: Negative for chest pain.  Gastrointestinal: Positive for abdominal pain and diarrhea. Negative for heartburn, nausea and vomiting.  Musculoskeletal: Negative for back pain and joint pain.  Skin: Negative.   Neurological: Negative for dizziness.  Psychiatric/Behavioral: The patient is not nervous/anxious.     Physical Exam  Constitutional: He appears well-developed and well-nourished. No distress.  Neck: No thyromegaly present.  Cardiovascular: Normal rate, regular rhythm and intact distal pulses.  Murmur heard. 1/6  Pulmonary/Chest: Effort normal and breath sounds normal. No respiratory distress.  Abdominal: Soft. Bowel sounds are normal. He exhibits no distension. There is tenderness. There is no guarding.  Peg tube present without signs of infection prsent Generalized mild tenderness present.   Musculoskeletal: Normal range of motion. He exhibits no edema.  Lymphadenopathy:    He has no cervical adenopathy.  Neurological: He is alert.  Skin: Skin is warm and dry. He is not diaphoretic.  Psychiatric: He has a normal mood and affect.      ASSESSMENT/ PLAN:  TODAY:   1. Gastroesophageal reflux disease without esophagitis 2. Pharyngoesophageal dysphagia.   Will  stop zantac Will begin prilosec 20 mg daily  Will hold miralax for diarrhea Will get kub    MD is aware of resident's narcotic use and is in agreement with current plan of care. We will attempt to wean resident as apropriate   Ok Edwards NP [facility]  Contact [phone] Monday through Friday  8am- 5pm  After hours call [phone]

## 2017-07-03 NOTE — Telephone Encounter (Signed)
Rx faxed to [facility] (P) [phone], (F) [phone] 

## 2017-07-05 ENCOUNTER — Telehealth: Payer: Self-pay | Admitting: Internal Medicine

## 2017-07-13 NOTE — Telephone Encounter (Signed)
Referral reviewed  I called and spoke to Ok Edwards, NP and [facility] regarding referral for [name]  He is known to me from several years ago with oropharyngeal dysphagia.  Barium esophagram was performed after this appointment which showed reflux but also silent aspiration. Multiple studies performed since including modified barium swallow and further visits with speech and swallow therapy. He continues to have oropharyngeal dysphagia with coughing when he tries to eat. He is strict n.p.o. currently at his living facility but is still eating some for comfort and this is resulting in coughing. He is been treated for thrush on 2 occasions with fluconazole 200 mg daily x14 days  After reviewing previous studies including CT chest, modified barium swallow and prior barium esophagram it appears that his dysphagia is oropharyngeal.  I do not think he would benefit from upper endoscopy or dilation.  I discussed this with Neoma Laming today. My recommendation is that he be n.p.o. so as to avoid coughing/aspiration and use his PEG for hydration and nutrition  For recurrent thrush or esophageal candidiasis I would recommend fluconazole 400 mg x 1 day and 200 mg x 20 days Clotrimazole troches may also help as prophylaxis after fluconazole therapy.  I am happy to be recontacted if further questions arise

## 2017-07-17 ENCOUNTER — Non-Acute Institutional Stay (SKILLED_NURSING_FACILITY): Payer: Medicare Other | Admitting: Adult Health

## 2017-07-17 ENCOUNTER — Encounter: Payer: Self-pay | Admitting: Adult Health

## 2017-07-17 DIAGNOSIS — A048 Other specified bacterial intestinal infections: Secondary | ICD-10-CM

## 2017-07-17 NOTE — Progress Notes (Signed)
Location:   [facility] Room Number: 121 A Place of Service:  SNF (31)   CODE STATUS: Full Code  Allergies  Allergen Reactions  . Tamsulosin Other (See Comments)    Dizziness, Made BP very low and weakness  . Celebrex [Celecoxib] Hives and Nausea And Vomiting    Gi upset  . Dronedarone Nausea And Vomiting and Other (See Comments)    GI upset, abdominal pain  . Esomeprazole Magnesium Hives and Other (See Comments)    "don't really remember"  . Digoxin Diarrhea    May have caused some diarrhea  . Hydrocodone-Acetaminophen Other (See Comments)    Bad headache  . Protonix [Pantoprazole Sodium] Nausea And Vomiting and Other (See Comments)    Tolerates Dexilant    Chief Complaint  Patient presents with  . Acute Visit    Lab follow up    HPI:  He has been complaining of abdominal pain; which worsens after his tube feedings. He states that the pain is present all the time; he denies any nausea or vomiting or diarrhea. There are no reports of fever present. His h-pylori test is positive.   Past Medical History:  Diagnosis Date  . Abnormal thyroid scan    Abnormal thyroid imaging studies from 11/09/2010, status post ultrasound guided fine needle aspiration of the dominant left inferior thyroid nodule on 12/15/2010. Cytology report showed rare follicular epithelial cells and hemosiderin laden macrophages.  . Adenomatous colon polyp   . AICD (automatic cardioverter/defibrillator) present    a. fx lead; a. s/p lead extraction 03/02/17  . Arthritis    "all over"  . Asthma   . Atrial fibrillation ([place])    on chronic Coumadin; stopped July 2013 due to subdural hematomas  . CHF (congestive heart failure) (HCC)    EF 35-40% s/p most recent ICD generator change-out with Medtronic dual-chamber ICD 05/20/11 with explantation of previous abdominally-implanted device  . Coronary artery disease    s/p CABG 1983 and PCI/stent 2004.   . Diabetes mellitus    diet controlled    . Diverticulosis   . Dyslipidemia   . Enteritis   . Erythrocytosis   . GERD (gastroesophageal reflux disease)   . Hepatic steatosis   . Hypertension   . Ischemic cardiomyopathy    WITH CHF  . Monocytosis 04/17/2013  . Myocardial infarction ([place]) 1983; ~ 1990  . Pleural effusion    right  . Pneumonia August 2013  . Portal hypertensive gastropathy ([place])   . Rectal ulcer   . Renal calculi   . Subdural hematoma [facility]) July 2013   Anticoagulation stopped.   . SunDown syndrome   . VT (ventricular tachycardia) ([place])     Past Surgical History:  Procedure Laterality Date  . CHOLECYSTECTOMY    . COLONOSCOPY  07/07/2011   Procedure: COLONOSCOPY;  Surgeon: Jerene Bears, MD;  Location: [facility] ENDOSCOPY;  Service: Gastroenterology;  Laterality: N/A;  . CORONARY ANGIOPLASTY WITH STENT PLACEMENT  2004   Tandem Cypher stents LAD  . CORONARY ARTERY BYPASS GRAFT  1983   SVG-mLAD  . ESOPHAGOGASTRODUODENOSCOPY  02/11/2011   Procedure: ESOPHAGOGASTRODUODENOSCOPY (EGD);  Surgeon: Beryle Beams, MD;  Location: [facility];  Service: Endoscopy;  Laterality: N/A;  . FLEXIBLE SIGMOIDOSCOPY N/A 02/08/2017   Procedure: FLEXIBLE SIGMOIDOSCOPY;  Surgeon: Irene Shipper, MD;  Location: [facility]OS[facility];  Service: Endoscopy;  Laterality: N/A;  . ICD GENERATOR CHANGEOUT N/A 04/08/2016   Procedure: ICD Generator Changeout;  Surgeon: Evans Lance, MD;  Location: [place] CV LAB;  Service: Cardiovascular;  Laterality: N/A;  . ICD LEAD REMOVAL N/A 03/02/2017   Procedure: ICD LEAD REMOVAL;  Surgeon: Evans Lance, MD;  Location: [place];  Service: Cardiovascular;  Laterality: N/A;  . IMPLANTABLE CARDIOVERTER DEFIBRILLATOR (ICD) GENERATOR CHANGE N/A 05/20/2011   Procedure: ICD GENERATOR CHANGE;  Surgeon: Evans Lance, MD;  Medtronic secure dual-chamber ICD serial number [id number]   . IR GASTROSTOMY TUBE MOD SED  04/19/2017  . KNEE ARTHROSCOPY     right; "just went in and scraped it"  . LEAD INSERTION N/A 03/02/2017    Procedure: LEAD INSERTION;  Surgeon: Evans Lance, MD;  Location: [place];  Service: Cardiovascular;  Laterality: N/A;  . MASS EXCISION Right 05/10/2013   Procedure: EXCISION MASS RIGHT THUMB;  Surgeon: Wynonia Sours, MD;  Location: [place];  Service: Orthopedics;  Laterality: Right;  . PROXIMAL INTERPHALANGEAL FUSION (PIP) Right 05/10/2013   Procedure: DEBRIDEMENT PROXIMAL INTERPHALANGEAL FUSION (PIP);  Surgeon: Wynonia Sours, MD;  Location: [place];  Service: Orthopedics;  Laterality: Right;  . TONSILLECTOMY          Social History   Socioeconomic History  . Marital status: Divorced    Spouse name: Not on file  . Number of children: 2  . Years of education: Not on file  . Highest education level: Not on file  Occupational History  . Occupation: Sports coach: RETIRED  Social Needs  . Financial resource strain: Not on file  . Food insecurity:    Worry: Not on file    Inability: Not on file  . Transportation needs:    Medical: Not on file    Non-medical: Not on file  Tobacco Use  . Smoking status: Former Smoker    Packs/day: 2.00    Years: 30.00    Pack years: 60.00    Types: Cigarettes    Last attempt to quit: 06/23/1976    Years since quitting: 41.0  . Smokeless tobacco: Never Used  Substance and Sexual Activity  . Alcohol use: No  . Drug use: No  . Sexual activity: Never  Lifestyle  . Physical activity:    Days per week: Not on file    Minutes per session: Not on file  . Stress: Not on file  Relationships  . Social connections:    Talks on phone: Not on file    Gets together: Not on file    Attends religious service: Not on file    Active member of club or organization: Not on file    Attends meetings of clubs or organizations: Not on file    Relationship status: Not on file  . Intimate partner violence:    Fear of current or ex partner: Not on file    Emotionally abused: Not on file    Physically abused: Not on  file    Forced sexual activity: Not on file  Other Topics Concern  . Not on file  Social History Narrative  . Not on file   Family History  Problem Relation Age of Onset  . Tuberculosis Mother   . Tuberculosis Father   . Heart disease Brother   . Diabetes Sister   . Diabetes Brother   . Clotting disorder Brother       VITAL SIGNS BP 132/64   Pulse 73   Temp 98 F (36.7 C)   Resp 20   Ht _0  (1.854 m)  Wt 151 lb 9.6 oz (68.8 kg)   SpO2 97%   BMI 20.00 kg/m   Outpatient Encounter Medications as of 07/17/2017  Medication Sig  . acetaminophen (TYLENOL) 650 MG suppository Place 1 suppository (650 mg total) rectally every 6 (six) hours as needed for fever or mild pain (T over 101).  . ADVAIR DISKUS 250-50 MCG/DOSE AEPB Inhale 1 puff into the lungs 2 (two) times daily.   . Artificial Saliva (BIOTENE MOISTURIZING MOUTH) SOLN Give 1 swab by mouth every 4 hours while awake  . digoxin (LANOXIN) 0.05 MG/ML solution Place 2.5 mLs (0.125 mg total) into feeding tube daily.  . fluticasone (FLONASE) 50 MCG/ACT nasal spray Place 1 spray into both nostrils daily as needed for allergies.  . furosemide (LASIX) 10 MG/ML solution Give 2 ml ('20mg'$ ) via PEG - Tube daily  . gabapentin (NEURONTIN) 100 MG capsule Give 1 Capsule ('100mg'$ ) to 3 capsules ('300mg'$ ) enterally at bedtime as needed  . ipratropium (ATROVENT) 0.03 % nasal spray Place 1 spray into both nostrils 3 (three) times daily before meals.  Marland Kitchen ipratropium-albuterol (DUONEB) 0.5-2.5 (3) MG/3ML SOLN Take 3 mLs by nebulization every 4 (four) hours as needed.  . Lisinopril 1 MG/ML SOLN Place 2.5 mg into feeding tube daily.  Marland Kitchen loratadine (CLARITIN) 10 MG tablet Take 1 tablet (10 mg total) by mouth daily as needed for allergies.  Marland Kitchen menthol-cetylpyridinium (CEPACOL) 3 MG lozenge Take 1 lozenge by mouth every 2 (two) hours as needed for sore throat.  . metoprolol tartrate (LOPRESSOR) 25 mg/10 mL SUSP Place 10 mLs (25 mg total) into feeding tube 2  (two) times daily.  . nitroGLYCERIN (NITROSTAT) 0.4 MG SL tablet Place 1 tablet (0.4 mg total) under the tongue every 5 (five) minutes as needed for chest pain.  . Nutritional Supplements (FEEDING SUPPLEMENT, JEVITY 1.5 CAL,) LIQD Jevity 1.5 -  Give 240cc  Six times daily  . Nutritional Supplements (NUTRITIONAL SUPPLEMENT PO) NPO - nothing by mouth diet, Nothing by mouth texture HSG Puree diet - HSG Puree texture, thickened Fluids consistency  . omeprazole (PRILOSEC) 20 MG capsule Give 1 capsule enterally daily  . ondansetron (ZOFRAN) 4 MG tablet Place 4 mg into feeding tube every 6 (six) hours as needed for nausea or vomiting.  . polyethylene glycol (MIRALAX / GLYCOLAX) packet Place 17 g into feeding tube daily.  . potassium chloride 20 MEQ/15ML (10%) SOLN Place 7.5 mLs (10 mEq total) into feeding tube daily.  . RESTASIS 0.05 % ophthalmic emulsion INSTILL 1 DROP INTO BOTH EYES TWICE A DAY  . tiotropium (SPIRIVA) 18 MCG inhalation capsule Place 18 mcg into inhaler and inhale daily.  . traMADol (ULTRAM) 50 MG tablet Give 1 tablet by mouth for moderate pain or 2 tablets by mouth for severe pain every 6 hours as needed  . Water For Irrigation, Sterile (FREE WATER) SOLN Place into feeding tube every 4 (four) hours. 150 ml H2o flushes via tube every 4 hours  . [DISCONTINUED] ranitidine (ZANTAC) 150 MG tablet Take 150 mg by mouth daily as needed for heartburn.  . [DISCONTINUED] traMADol (ULTRAM) 50 MG tablet Take 2 tablets (100 mg total) by mouth every 6 (six) hours as needed. (Patient not taking: Reported on 07/17/2017)   No facility-administered encounter medications on file as of 07/17/2017.      SIGNIFICANT DIAGNOSTIC EXAMS  PREVIOUS:   03-26-17: ct of chest abdomen and pelvis: 1. No acute/traumatic intrathoracic, abdominal, or pelvic pathology. 2. Nonocclusive pulmonary embolus in the subsegmental right lower lobe  pulmonary artery branches likely old PE or scarring. Nonocclusive acute PE is not  entirely excluded. Clinical correlation is recommended. 3. Mild cardiomegaly with dilatation of the right atrium. 4. 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 Small right pleural effusion and stable right lung base round atelectasis/scarring. 5. Aortic Atherosclerosis  and Emphysema    04-07-17: chest x-ray: Suspected patchy infiltrate at the right base, short interval radiographic follow-up suggested to ensure clearing. Cardiomegaly.  04-09-17: chest x-ray: COPD, cardiomegaly, stable. Increasing right basilar airspace opacity and small right effusion concerning for pneumonia  04-10-17: swallow study: Recommend NPO with short term alternative nutrition and continued ST   04-19-17: PEG tube insertion   05-01-17: chest x-ray: mild right basilar infiltrate with trace right sided effusion  05-23-17: chest x-ray: COPD changes with mild atelectasis and small pleural effusion versus pleural thickening at RIGHT base. Improved RIGHT basilar infiltrates since previous exam.  06-19-17: swallow study: Recommend pt continue po and dysphagia treatment with SLP. Once compensation strategies are generalized, suspect pt may tolerate comfort po intake independently. As his strong "hock" is protective of his airway, suspect care plan may be able to be more liberal. Anticipate however that pt will continue to require PEG tube for nutrition.    NO NEW EXAMS.     LABS REVIEWED: PREVIOUS:   04-07-17: wbc 9.5; hgb 13.4; hct 41.9; mcv 94.2; plt 101; glucose 84; bun 18; creat 1.28 ;k+ 4.0; na++ 136; ca 8.2; total bili 2.5; albumin 3.2; rapid flu: +A; blood culture: no growth; urine culture: <10,000 colonies 04-09-17: wbc 9.0; hgb 11.5; hct 35.1; mcv 91.9; plt 120; glucose 88; bun 9; creat 0.86; k+ 3.5; na++ 133; ca 7.5; mag 1.7 04-13-17: wbc 9.4; hgb 13.8; hct 42.6; mcv 94.0; plt 217; glucose 115; bun 11; creat 1.04 ;k+ 3.3; na++144; ca 8.7 04-16-17: wbc 6.5; hgb 12.2; hct 38.5; mcv 95.3; plt 185;  glucose 115; bun 11; creat 0.78; k+ 4.0; na++ 143; ca 8.9; mag 1.7  04-20-17:wbc 12.4; hgb 12.2; hct 38.0; mcv 93.6; plt 209; glucose 110; bun 18; creat 0.95; k+ 4.7; na++ 136; ca 8.4; mag 2.7;  04-22-17: mag 1.9 05-02-17: wbc 7.1; hgb 11.8; hct 34.0; mcv 87.6; plt 223; glucose 130; bun 22.2; creat 0.71; k+ 4.4; na++ 143; ca 9.1; alk phos 123; albumin 3.4; tsh 0.91   TODAY:   07-11-17: H pylori: IgG AB: 0.43; IgA AB: 20.1 (hi): IgM abs: <9.0  Review of Systems  Constitutional: Negative for malaise/fatigue.  Respiratory: Negative for cough and shortness of breath.   Cardiovascular: Negative for chest pain, palpitations and leg swelling.  Gastrointestinal: Positive for abdominal pain. Negative for constipation, diarrhea, heartburn and nausea.  Musculoskeletal: Negative for back pain, joint pain and myalgias.  Skin: Negative.   Neurological: Negative for dizziness.  Psychiatric/Behavioral: The patient is not nervous/anxious.     Physical Exam  Constitutional: He appears well-developed and well-nourished. No distress.  Neck: No thyromegaly present.  Cardiovascular: Normal rate, regular rhythm and intact distal pulses.  Murmur heard. 1/6  Pulmonary/Chest: Effort normal and breath sounds normal. No respiratory distress.  Abdominal: Soft. Bowel sounds are normal. There is tenderness.  Peg tube present without signs of infection present Has generalized tenderness present.   Musculoskeletal: Normal range of motion. He exhibits no edema.  Lymphadenopathy:    He has no cervical adenopathy.  Neurological: He is alert.  Skin: Skin is warm and dry. He is not diaphoretic.  Psychiatric: He has a normal mood and  affect.      ASSESSMENT/ PLAN:  TODAY:   1. H-pylori  Will stop prilosec Will begin prevacid 15 mg daily; biaxin 500 mg twice daily and amoxicillin 1 gm twice daily for 14 days and will monitor his status.   MD is aware of resident's narcotic use and is in agreement with current  plan of care. We will attempt to wean resident as apropriate   Ok Edwards NP [facility]  Contact [phone] Monday through Friday 8am- 5pm  After hours call [phone]

## 2017-07-19 DIAGNOSIS — J9601 Acute respiratory failure with hypoxia: Secondary | ICD-10-CM | POA: Diagnosis not present

## 2017-07-19 DIAGNOSIS — I5022 Chronic systolic (congestive) heart failure: Secondary | ICD-10-CM | POA: Diagnosis not present

## 2017-07-19 DIAGNOSIS — I2510 Atherosclerotic heart disease of native coronary artery without angina pectoris: Secondary | ICD-10-CM | POA: Diagnosis not present

## 2017-07-19 DIAGNOSIS — K219 Gastro-esophageal reflux disease without esophagitis: Secondary | ICD-10-CM | POA: Diagnosis not present

## 2017-07-19 DIAGNOSIS — R1314 Dysphagia, pharyngoesophageal phase: Secondary | ICD-10-CM | POA: Diagnosis not present

## 2017-07-20 DIAGNOSIS — R1314 Dysphagia, pharyngoesophageal phase: Secondary | ICD-10-CM | POA: Diagnosis not present

## 2017-07-20 DIAGNOSIS — J9601 Acute respiratory failure with hypoxia: Secondary | ICD-10-CM | POA: Diagnosis not present

## 2017-07-20 DIAGNOSIS — K219 Gastro-esophageal reflux disease without esophagitis: Secondary | ICD-10-CM | POA: Diagnosis not present

## 2017-07-20 DIAGNOSIS — I5022 Chronic systolic (congestive) heart failure: Secondary | ICD-10-CM | POA: Diagnosis not present

## 2017-07-20 DIAGNOSIS — I2510 Atherosclerotic heart disease of native coronary artery without angina pectoris: Secondary | ICD-10-CM | POA: Diagnosis not present

## 2017-07-21 DIAGNOSIS — K219 Gastro-esophageal reflux disease without esophagitis: Secondary | ICD-10-CM | POA: Diagnosis not present

## 2017-07-21 DIAGNOSIS — J9601 Acute respiratory failure with hypoxia: Secondary | ICD-10-CM | POA: Diagnosis not present

## 2017-07-21 DIAGNOSIS — I5022 Chronic systolic (congestive) heart failure: Secondary | ICD-10-CM | POA: Diagnosis not present

## 2017-07-21 DIAGNOSIS — R1314 Dysphagia, pharyngoesophageal phase: Secondary | ICD-10-CM | POA: Diagnosis not present

## 2017-07-21 DIAGNOSIS — I2510 Atherosclerotic heart disease of native coronary artery without angina pectoris: Secondary | ICD-10-CM | POA: Diagnosis not present

## 2017-07-24 DIAGNOSIS — A048 Other specified bacterial intestinal infections: Secondary | ICD-10-CM | POA: Insufficient documentation

## 2017-07-24 DIAGNOSIS — I5022 Chronic systolic (congestive) heart failure: Secondary | ICD-10-CM | POA: Diagnosis not present

## 2017-07-24 DIAGNOSIS — R1314 Dysphagia, pharyngoesophageal phase: Secondary | ICD-10-CM | POA: Diagnosis not present

## 2017-07-24 DIAGNOSIS — J9601 Acute respiratory failure with hypoxia: Secondary | ICD-10-CM | POA: Diagnosis not present

## 2017-07-24 DIAGNOSIS — K219 Gastro-esophageal reflux disease without esophagitis: Secondary | ICD-10-CM | POA: Diagnosis not present

## 2017-07-24 DIAGNOSIS — I2510 Atherosclerotic heart disease of native coronary artery without angina pectoris: Secondary | ICD-10-CM | POA: Diagnosis not present

## 2017-07-25 DIAGNOSIS — G3184 Mild cognitive impairment, so stated: Secondary | ICD-10-CM | POA: Diagnosis not present

## 2017-07-26 DIAGNOSIS — R1314 Dysphagia, pharyngoesophageal phase: Secondary | ICD-10-CM | POA: Diagnosis not present

## 2017-07-26 DIAGNOSIS — J9601 Acute respiratory failure with hypoxia: Secondary | ICD-10-CM | POA: Diagnosis not present

## 2017-07-26 DIAGNOSIS — I2510 Atherosclerotic heart disease of native coronary artery without angina pectoris: Secondary | ICD-10-CM | POA: Diagnosis not present

## 2017-07-26 DIAGNOSIS — K219 Gastro-esophageal reflux disease without esophagitis: Secondary | ICD-10-CM | POA: Diagnosis not present

## 2017-07-26 DIAGNOSIS — I5022 Chronic systolic (congestive) heart failure: Secondary | ICD-10-CM | POA: Diagnosis not present

## 2017-07-27 ENCOUNTER — Non-Acute Institutional Stay (SKILLED_NURSING_FACILITY): Payer: Medicare Other | Admitting: Adult Health

## 2017-07-27 ENCOUNTER — Encounter: Payer: Self-pay | Admitting: Adult Health

## 2017-07-27 DIAGNOSIS — A048 Other specified bacterial intestinal infections: Secondary | ICD-10-CM | POA: Diagnosis not present

## 2017-07-27 DIAGNOSIS — N183 Chronic kidney disease, stage 3 unspecified: Secondary | ICD-10-CM

## 2017-07-27 DIAGNOSIS — I2510 Atherosclerotic heart disease of native coronary artery without angina pectoris: Secondary | ICD-10-CM | POA: Diagnosis not present

## 2017-07-27 DIAGNOSIS — K219 Gastro-esophageal reflux disease without esophagitis: Secondary | ICD-10-CM | POA: Diagnosis not present

## 2017-07-27 DIAGNOSIS — K5909 Other constipation: Secondary | ICD-10-CM

## 2017-07-27 DIAGNOSIS — J9601 Acute respiratory failure with hypoxia: Secondary | ICD-10-CM | POA: Diagnosis not present

## 2017-07-27 DIAGNOSIS — R1314 Dysphagia, pharyngoesophageal phase: Secondary | ICD-10-CM | POA: Diagnosis not present

## 2017-07-27 DIAGNOSIS — I5022 Chronic systolic (congestive) heart failure: Secondary | ICD-10-CM | POA: Diagnosis not present

## 2017-07-27 NOTE — Progress Notes (Signed)
Location:   [facility] Room Number: 121 A Place of Service:  SNF (31)   CODE STATUS: Full Code  Allergies  Allergen Reactions  . Tamsulosin Other (See Comments)    Dizziness, Made BP very low and weakness  . Celebrex [Celecoxib] Hives and Nausea And Vomiting    Gi upset  . Dronedarone Nausea And Vomiting and Other (See Comments)    GI upset, abdominal pain  . Esomeprazole Magnesium Hives and Other (See Comments)    "don't really remember"  . Digoxin Diarrhea    May have caused some diarrhea  . Hydrocodone-Acetaminophen Other (See Comments)    Bad headache  . Protonix [Pantoprazole Sodium] Nausea And Vomiting and Other (See Comments)    Tolerates Dexilant    Chief Complaint  Patient presents with  . Medical Management of Chronic Issues    Gerd; constipation; dysphagia; ckd stage 3    HPI:  He is a 84 year old long term resident of this facility being seen for the management of his chronic illnesses: gerd; constipation; dysphagia; ckd stage 3. He is presently being treated for H-pylori. He tells me that his stomach is slightly better. He denies any diarrhea; no heart burn. There are no nursing concerns at this time. There are no reports of him declining his tube feeding.    Past Medical History:  Diagnosis Date  . Abnormal thyroid scan    Abnormal thyroid imaging studies from 11/09/2010, status post ultrasound guided fine needle aspiration of the dominant left inferior thyroid nodule on 12/15/2010. Cytology report showed rare follicular epithelial cells and hemosiderin laden macrophages.  . Adenomatous colon polyp   . AICD (automatic cardioverter/defibrillator) present    a. fx lead; a. s/p lead extraction 03/02/17  . Arthritis    "all over"  . Asthma   . Atrial fibrillation ([place])    on chronic Coumadin; stopped July 2013 due to subdural hematomas  . CHF (congestive heart failure) (HCC)    EF 35-40% s/p most recent ICD generator change-out with  Medtronic dual-chamber ICD 05/20/11 with explantation of previous abdominally-implanted device  . Coronary artery disease    s/p CABG 1983 and PCI/stent 2004.   . Diabetes mellitus    diet controlled  . Diverticulosis   . Dyslipidemia   . Enteritis   . Erythrocytosis   . GERD (gastroesophageal reflux disease)   . Hepatic steatosis   . Hypertension   . Ischemic cardiomyopathy    WITH CHF  . Monocytosis 04/17/2013  . Myocardial infarction ([place]) 1983; ~ 1990  . Pleural effusion    right  . Pneumonia August 2013  . Portal hypertensive gastropathy ([place])   . Rectal ulcer   . Renal calculi   . Subdural hematoma [facility]) July 2013   Anticoagulation stopped.   . SunDown syndrome   . VT (ventricular tachycardia) ([place])     Past Surgical History:  Procedure Laterality Date  . CHOLECYSTECTOMY    . COLONOSCOPY  07/07/2011   Procedure: COLONOSCOPY;  Surgeon: Jerene Bears, MD;  Location: [facility];  Service: Gastroenterology;  Laterality: N/A;  . CORONARY ANGIOPLASTY WITH STENT PLACEMENT  2004   Tandem Cypher stents LAD  . CORONARY ARTERY BYPASS GRAFT  1983   SVG-mLAD  . ESOPHAGOGASTRODUODENOSCOPY  02/11/2011   Procedure: ESOPHAGOGASTRODUODENOSCOPY (EGD);  Surgeon: Beryle Beams, MD;  Location: [facility];  Service: Endoscopy;  Laterality: N/A;  . FLEXIBLE SIGMOIDOSCOPY N/A 02/08/2017   Procedure: FLEXIBLE SIGMOIDOSCOPY;  Surgeon: Irene Shipper,  MD;  Location: [facility];  Service: Endoscopy;  Laterality: N/A;  . ICD GENERATOR CHANGEOUT N/A 04/08/2016   Procedure: ICD Generator Changeout;  Surgeon: Evans Lance, MD;  Location: [place] [facility];  Service: Cardiovascular;  Laterality: N/A;  . ICD LEAD REMOVAL N/A 03/02/2017   Procedure: ICD LEAD REMOVAL;  Surgeon: Evans Lance, MD;  Location: [place];  Service: Cardiovascular;  Laterality: N/A;  . IMPLANTABLE CARDIOVERTER DEFIBRILLATOR (ICD) GENERATOR CHANGE N/A 05/20/2011   Procedure: ICD GENERATOR CHANGE;  Surgeon: Evans Lance, MD;   Medtronic secure dual-chamber ICD serial number [id number]   . IR GASTROSTOMY TUBE MOD SED  04/19/2017  . KNEE ARTHROSCOPY     right; "just went in and scraped it"  . LEAD INSERTION N/A 03/02/2017   Procedure: LEAD INSERTION;  Surgeon: Evans Lance, MD;  Location: [place];  Service: Cardiovascular;  Laterality: N/A;  . MASS EXCISION Right 05/10/2013   Procedure: EXCISION MASS RIGHT THUMB;  Surgeon: Wynonia Sours, MD;  Location: [place];  Service: Orthopedics;  Laterality: Right;  . PROXIMAL INTERPHALANGEAL FUSION (PIP) Right 05/10/2013   Procedure: DEBRIDEMENT PROXIMAL INTERPHALANGEAL FUSION (PIP);  Surgeon: Wynonia Sours, MD;  Location: [place];  Service: Orthopedics;  Laterality: Right;  . TONSILLECTOMY          Social History   Socioeconomic History  . Marital status: Divorced    Spouse name: Not on file  . Number of children: 2  . Years of education: Not on file  . Highest education level: Not on file  Occupational History  . Occupation: Sports coach: RETIRED  Social Needs  . Financial resource strain: Not on file  . Food insecurity:    Worry: Not on file    Inability: Not on file  . Transportation needs:    Medical: Not on file    Non-medical: Not on file  Tobacco Use  . Smoking status: Former Smoker    Packs/day: 2.00    Years: 30.00    Pack years: 60.00    Types: Cigarettes    Last attempt to quit: 06/23/1976    Years since quitting: 41.1  . Smokeless tobacco: Never Used  Substance and Sexual Activity  . Alcohol use: No  . Drug use: No  . Sexual activity: Never  Lifestyle  . Physical activity:    Days per week: Not on file    Minutes per session: Not on file  . Stress: Not on file  Relationships  . Social connections:    Talks on phone: Not on file    Gets together: Not on file    Attends religious service: Not on file    Active member of club or organization: Not on file    Attends meetings of clubs or  organizations: Not on file    Relationship status: Not on file  . Intimate partner violence:    Fear of current or ex partner: Not on file    Emotionally abused: Not on file    Physically abused: Not on file    Forced sexual activity: Not on file  Other Topics Concern  . Not on file  Social History Narrative  . Not on file   Family History  Problem Relation Age of Onset  . Tuberculosis Mother   . Tuberculosis Father   . Heart disease Brother   . Diabetes Sister   . Diabetes Brother   . Clotting disorder Brother  VITAL SIGNS BP 117/65   Pulse 76   Temp (!) 97.1 F (36.2 C)   Resp 20   Ht '6\' 1"'  (1.854 m)   Wt 151 lb 9.6 oz (68.8 kg)   SpO2 98%   BMI 20.00 kg/m   Outpatient Encounter Medications as of 07/27/2017  Medication Sig  . acetaminophen (TYLENOL) 650 MG suppository Place 1 suppository (650 mg total) rectally every 6 (six) hours as needed for fever or mild pain (T over 101).  . ADVAIR DISKUS 250-50 MCG/DOSE AEPB Inhale 1 puff into the lungs 2 (two) times daily.   Marland Kitchen amoxicillin (AMOXIL) 500 MG tablet Take 500 mg by mouth 2 (two) times daily. Give 2 tablets via G-Tube two times daily until 07/31/17  . Artificial Saliva (BIOTENE MOISTURIZING MOUTH) SOLN Give 1 swab by mouth every 4 hours while awake  . clarithromycin (BIAXIN) 500 MG tablet Place 500 mg into feeding tube 2 (two) times daily.  . digoxin (LANOXIN) 0.05 MG/ML solution Place 2.5 mLs (0.125 mg total) into feeding tube daily.  . fluticasone (FLONASE) 50 MCG/ACT nasal spray Place 1 spray into both nostrils daily as needed for allergies.  . furosemide (LASIX) 10 MG/ML solution Give 2 ml (11m) via PEG - Tube daily  . gabapentin (NEURONTIN) 100 MG capsule Give 1 Capsule (1041m to 3 capsules (30033menterally at bedtime as needed  . ipratropium (ATROVENT) 0.03 % nasal spray Place 1 spray into both nostrils 3 (three) times daily before meals.  . iMarland Kitchenratropium-albuterol (DUONEB) 0.5-2.5 (3) MG/3ML SOLN Take 3 mLs  by nebulization every 4 (four) hours as needed.  . lansoprazole (PREVACID) 15 MG capsule Take 15 mg by mouth daily at 12 noon.  . Lisinopril 1 MG/ML SOLN Place 2.5 mg into feeding tube daily.  . lMarland Kitchenratadine (CLARITIN) 10 MG tablet Take 1 tablet (10 mg total) by mouth daily as needed for allergies.  . mMarland Kitchennthol-cetylpyridinium (CEPACOL) 3 MG lozenge Take 1 lozenge by mouth every 2 (two) hours as needed for sore throat.  . metoprolol tartrate (LOPRESSOR) 25 mg/10 mL SUSP Place 10 mLs (25 mg total) into feeding tube 2 (two) times daily.  . nitroGLYCERIN (NITROSTAT) 0.4 MG SL tablet Place 1 tablet (0.4 mg total) under the tongue every 5 (five) minutes as needed for chest pain.  . Nutritional Supplements (FEEDING SUPPLEMENT, JEVITY 1.5 CAL,) LIQD Jevity 1.5 -  Give 240cc  Six times daily  . Nutritional Supplements (NUTRITIONAL SUPPLEMENT PO) NPO - nothing by mouth diet, Nothing by mouth texture HSG Puree diet - HSG Puree texture, thickened Fluids consistency  . omeprazole (PRILOSEC) 20 MG capsule Give 1 capsule enterally daily  . ondansetron (ZOFRAN) 4 MG tablet Place 4 mg into feeding tube every 6 (six) hours as needed for nausea or vomiting.  . polyethylene glycol (MIRALAX / GLYCOLAX) packet Place 17 g into feeding tube daily.  . potassium chloride 20 MEQ/15ML (10%) SOLN Place 7.5 mLs (10 mEq total) into feeding tube daily.  . RESTASIS 0.05 % ophthalmic emulsion INSTILL 1 DROP INTO BOTH EYES TWICE A DAY  . tiotropium (SPIRIVA) 18 MCG inhalation capsule Place 18 mcg into inhaler and inhale daily.  . traMADol (ULTRAM) 50 MG tablet Give 1 tablet by mouth for moderate pain or 2 tablets by mouth for severe pain every 6 hours as needed  . Water For Irrigation, Sterile (FREE WATER) SOLN Place into feeding tube every 4 (four) hours. 120 ml H2o flushes via tube every 4 hours   No facility-administered encounter medications  on file as of 07/27/2017.      SIGNIFICANT DIAGNOSTIC EXAMS  PREVIOUS:   03-26-17:  ct of chest abdomen and pelvis: 1. No acute/traumatic intrathoracic, abdominal, or pelvic pathology. 2. Nonocclusive pulmonary embolus in the subsegmental right lower lobe pulmonary artery branches likely old PE or scarring. Nonocclusive acute PE is not entirely excluded. Clinical correlation is recommended. 3. Mild cardiomegaly with dilatation of the right atrium. 4. 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 Small right pleural effusion and stable right lung base round atelectasis/scarring. 5. Aortic Atherosclerosis  and Emphysema    04-07-17: chest x-ray: Suspected patchy infiltrate at the right base, short interval radiographic follow-up suggested to ensure clearing. Cardiomegaly.  04-09-17: chest x-ray: COPD, cardiomegaly, stable. Increasing right basilar airspace opacity and small right effusion concerning for pneumonia  04-10-17: swallow study: Recommend NPO with short term alternative nutrition and continued ST   04-19-17: PEG tube insertion   05-01-17: chest x-ray: mild right basilar infiltrate with trace right sided effusion  05-23-17: chest x-ray: COPD changes with mild atelectasis and small pleural effusion versus pleural thickening at RIGHT base. Improved RIGHT basilar infiltrates since previous exam.  06-19-17: swallow study: Recommend pt continue po and dysphagia treatment with SLP. Once compensation strategies are generalized, suspect pt may tolerate comfort po intake independently. As his strong "hock" is protective of his airway, suspect care plan may be able to be more liberal. Anticipate however that pt will continue to require PEG tube for nutrition.    NO NEW EXAMS.     LABS REVIEWED: PREVIOUS:   04-07-17: wbc 9.5; hgb 13.4; hct 41.9; mcv 94.2; plt 101; glucose 84; bun 18; creat 1.28 ;k+ 4.0; na++ 136; ca 8.2; total bili 2.5; albumin 3.2; rapid flu: +A; blood culture: no growth; urine culture: <10,000 colonies 04-09-17: wbc 9.0; hgb 11.5; hct 35.1; mcv  91.9; plt 120; glucose 88; bun 9; creat 0.86; k+ 3.5; na++ 133; ca 7.5; mag 1.7 04-13-17: wbc 9.4; hgb 13.8; hct 42.6; mcv 94.0; plt 217; glucose 115; bun 11; creat 1.04 ;k+ 3.3; na++144; ca 8.7 04-16-17: wbc 6.5; hgb 12.2; hct 38.5; mcv 95.3; plt 185; glucose 115; bun 11; creat 0.78; k+ 4.0; na++ 143; ca 8.9; mag 1.7  04-20-17:wbc 12.4; hgb 12.2; hct 38.0; mcv 93.6; plt 209; glucose 110; bun 18; creat 0.95; k+ 4.7; na++ 136; ca 8.4; mag 2.7;  04-22-17: mag 1.9 05-02-17: wbc 7.1; hgb 11.8; hct 34.0; mcv 87.6; plt 223; glucose 130; bun 22.2; creat 0.71; k+ 4.4; na++ 143; ca 9.1; alk phos 123; albumin 3.4; tsh 0.91  07-11-17: H pylori: IgG AB: 0.43; IgA AB: 20.1 (hi): IgM abs: <9.0  NO NEW LABS.   Review of Systems  Constitutional: Negative for malaise/fatigue.  Respiratory: Negative for cough and shortness of breath.   Cardiovascular: Negative for chest pain, palpitations and leg swelling.  Gastrointestinal: Positive for abdominal pain. Negative for constipation and heartburn.       Mild   Musculoskeletal: Negative for back pain, joint pain and myalgias.  Skin: Negative.   Neurological: Negative for dizziness.  Psychiatric/Behavioral: The patient is not nervous/anxious.      Physical Exam  Constitutional: He appears well-developed and well-nourished. No distress.  Neck: No thyromegaly present.  Cardiovascular: Normal rate, regular rhythm and intact distal pulses.  Murmur heard. 1/6  Pulmonary/Chest: Effort normal and breath sounds normal. No respiratory distress.  Abdominal: Soft. Bowel sounds are normal. He exhibits no distension. There is tenderness.  Mild diffuse abdominal  tenderness Peg tube present without signs of infection present.   Musculoskeletal: Normal range of motion. He exhibits no edema.  Using walker   Lymphadenopathy:    He has no cervical adenopathy.  Neurological: He is alert.  Skin: Skin is warm and dry. He is not diaphoretic.  Psychiatric: He has a normal mood and  affect.     ASSESSMENT/ PLAN:   TODAY;  1. Gastroesophageal reflux disease without esophagitis: stable will continue prilosec 20 mg daily    2. Chronic constipation: stable will continue miralax daily   3.  Pharyngoesophageal dysphagia: has chronic aspiration: is currently NPO and is receiving jevity tube feeding. For his dry mouth will continuebiotene every 4 hours while awake and will monitor his status.  Will continue atrovent nasal spray prior to meals   4. CKD stage III: stable bun 22.2/creat 0.71  5. H. pylori infection: will complete treatment and will continue to monitor his status.    PREVIOUS    6.  Adult failure to thrive: weight is 151 pounds; albumin is 3.4; without change in status; will continue to monitor  7. History of right lung PE; without change he is not a candidate for anticoagulation therapy due to history of falls and subdural hematoma.     8. Chronic systolic heart failure: EF 35-40%: is status post AICD placement:  is stable will continue lasix 20 mg daily with k+ 10 meq daily  lopressor 25 mg twice daily and will monitor his status.   9. Atrial fibrillation with controlled ventricular response: has history of PE  heart rate stable: is not a candidate for anticoagulations due to history of subdural hematoma: will continue lopressor 25 mg twice daily and digoxin 0.125 mg daily for rate control   10. Coronary artery disease involving native coronary artery with angina pectoris: is status post CABG/ stent placements: is stable will continue lopressor 25 mg twice daily and has prn ntg.   11. Hypertensive heart disease with heart failure: stable b/p 117/65 will continue lopressor 25 mg twice daily and lisinopril 2.5 mg daily  12. COPD( emphysema): is stable: is now on 02 at 2L/[place] as needed to keep sats >91 %. Will continue advair 250/50 twice daily  spiriva 18 mcg daily duoneb every 6 every 4 hours as needed   13. Chronic allergic rhinitis: stable will continue  claritin daily as needed and flonase daily as needed  14. Polyneuropathy : is stable has neurontin 300 mg nightly as needed    MD is aware of resident's narcotic use and is in agreement with current plan of care. We will attempt to wean resident as apropriate   Ok Edwards NP [facility]  Contact [phone] Monday through Friday 8am- 5pm  After hours call [phone]

## 2017-07-28 DIAGNOSIS — K219 Gastro-esophageal reflux disease without esophagitis: Secondary | ICD-10-CM | POA: Diagnosis not present

## 2017-07-28 DIAGNOSIS — I5022 Chronic systolic (congestive) heart failure: Secondary | ICD-10-CM | POA: Diagnosis not present

## 2017-07-28 DIAGNOSIS — J9601 Acute respiratory failure with hypoxia: Secondary | ICD-10-CM | POA: Diagnosis not present

## 2017-07-28 DIAGNOSIS — I2510 Atherosclerotic heart disease of native coronary artery without angina pectoris: Secondary | ICD-10-CM | POA: Diagnosis not present

## 2017-07-28 DIAGNOSIS — R1314 Dysphagia, pharyngoesophageal phase: Secondary | ICD-10-CM | POA: Diagnosis not present

## 2017-07-30 DIAGNOSIS — I2510 Atherosclerotic heart disease of native coronary artery without angina pectoris: Secondary | ICD-10-CM | POA: Diagnosis not present

## 2017-07-30 DIAGNOSIS — I5022 Chronic systolic (congestive) heart failure: Secondary | ICD-10-CM | POA: Diagnosis not present

## 2017-07-30 DIAGNOSIS — R1314 Dysphagia, pharyngoesophageal phase: Secondary | ICD-10-CM | POA: Diagnosis not present

## 2017-07-30 DIAGNOSIS — K219 Gastro-esophageal reflux disease without esophagitis: Secondary | ICD-10-CM | POA: Diagnosis not present

## 2017-07-30 DIAGNOSIS — J9601 Acute respiratory failure with hypoxia: Secondary | ICD-10-CM | POA: Diagnosis not present

## 2017-07-31 ENCOUNTER — Non-Acute Institutional Stay (SKILLED_NURSING_FACILITY): Payer: Medicare Other | Admitting: Adult Health

## 2017-07-31 ENCOUNTER — Encounter: Payer: Self-pay | Admitting: Adult Health

## 2017-07-31 DIAGNOSIS — R1314 Dysphagia, pharyngoesophageal phase: Secondary | ICD-10-CM | POA: Diagnosis not present

## 2017-07-31 DIAGNOSIS — K219 Gastro-esophageal reflux disease without esophagitis: Secondary | ICD-10-CM | POA: Diagnosis not present

## 2017-07-31 DIAGNOSIS — E440 Moderate protein-calorie malnutrition: Secondary | ICD-10-CM | POA: Diagnosis not present

## 2017-07-31 DIAGNOSIS — T17908A Unspecified foreign body in respiratory tract, part unspecified causing other injury, initial encounter: Secondary | ICD-10-CM | POA: Diagnosis not present

## 2017-07-31 DIAGNOSIS — M6281 Muscle weakness (generalized): Secondary | ICD-10-CM | POA: Diagnosis not present

## 2017-07-31 DIAGNOSIS — J9601 Acute respiratory failure with hypoxia: Secondary | ICD-10-CM | POA: Diagnosis not present

## 2017-07-31 NOTE — Progress Notes (Signed)
Location:   [facility] Room Number: 221 A Place of Service:  SNF (31)   CODE STATUS: Full Code  Allergies  Allergen Reactions  . Tamsulosin Other (See Comments)    Dizziness, Made BP very low and weakness  . Celebrex [Celecoxib] Hives and Nausea And Vomiting    Gi upset  . Dronedarone Nausea And Vomiting and Other (See Comments)    GI upset, abdominal pain  . Esomeprazole Magnesium Hives and Other (See Comments)    "don't really remember"  . Digoxin Diarrhea    May have caused some diarrhea  . Hydrocodone-Acetaminophen Other (See Comments)    Bad headache  . Protonix [Pantoprazole Sodium] Nausea And Vomiting and Other (See Comments)    Tolerates Dexilant    Chief Complaint  Patient presents with  . Acute Visit    Care Plan Meeting    HPI:  We have come together for his care plan meeting. His daughter is present via conference call. He remains a high risk for aspiration. He will continue to need tube feeding to maintain his nutritional status. At times his food will not go down. He will cough the food up. I have spoken to GI and was told they have done all they can. There are no reports of aspiration present. He states is abdominal pain is slowly improving. He will be able to have pleasure foods.   Past Medical History:  Diagnosis Date  . Abnormal thyroid scan    Abnormal thyroid imaging studies from 11/09/2010, status post ultrasound guided fine needle aspiration of the dominant left inferior thyroid nodule on 12/15/2010. Cytology report showed rare follicular epithelial cells and hemosiderin laden macrophages.  . Adenomatous colon polyp   . AICD (automatic cardioverter/defibrillator) present    a. fx lead; a. s/p lead extraction 03/02/17  . Arthritis    "all over"  . Asthma   . Atrial fibrillation ([place])    on chronic Coumadin; stopped July 2013 due to subdural hematomas  . CHF (congestive heart failure) (HCC)    EF 35-40% s/p most recent ICD  generator change-out with Medtronic dual-chamber ICD 05/20/11 with explantation of previous abdominally-implanted device  . Coronary artery disease    s/p CABG 1983 and PCI/stent 2004.   . Diabetes mellitus    diet controlled  . Diverticulosis   . Dyslipidemia   . Enteritis   . Erythrocytosis   . GERD (gastroesophageal reflux disease)   . Hepatic steatosis   . Hypertension   . Ischemic cardiomyopathy    WITH CHF  . Monocytosis 04/17/2013  . Myocardial infarction ([place]) 1983; ~ 1990  . Pleural effusion    right  . Pneumonia August 2013  . Portal hypertensive gastropathy ([place])   . Rectal ulcer   . Renal calculi   . Subdural hematoma [facility]) July 2013   Anticoagulation stopped.   . SunDown syndrome   . VT (ventricular tachycardia) ([place])     Past Surgical History:  Procedure Laterality Date  . CHOLECYSTECTOMY    . COLONOSCOPY  07/07/2011   Procedure: COLONOSCOPY;  Surgeon: Jerene Bears, MD;  Location: [facility] ENDOSCOPY;  Service: Gastroenterology;  Laterality: N/A;  . CORONARY ANGIOPLASTY WITH STENT PLACEMENT  2004   Tandem Cypher stents LAD  . CORONARY ARTERY BYPASS GRAFT  1983   SVG-mLAD  . ESOPHAGOGASTRODUODENOSCOPY  02/11/2011   Procedure: ESOPHAGOGASTRODUODENOSCOPY (EGD);  Surgeon: Beryle Beams, MD;  Location: [facility];  Service: Endoscopy;  Laterality: N/A;  . FLEXIBLE SIGMOIDOSCOPY N/A  02/08/2017   Procedure: FLEXIBLE SIGMOIDOSCOPY;  Surgeon: Irene Shipper, MD;  Location: [facility];  Service: Endoscopy;  Laterality: N/A;  . ICD GENERATOR CHANGEOUT N/A 04/08/2016   Procedure: ICD Generator Changeout;  Surgeon: Evans Lance, MD;  Location: [place] CV LAB;  Service: Cardiovascular;  Laterality: N/A;  . ICD LEAD REMOVAL N/A 03/02/2017   Procedure: ICD LEAD REMOVAL;  Surgeon: Evans Lance, MD;  Location: [place];  Service: Cardiovascular;  Laterality: N/A;  . IMPLANTABLE CARDIOVERTER DEFIBRILLATOR (ICD) GENERATOR CHANGE N/A 05/20/2011   Procedure: ICD GENERATOR CHANGE;   Surgeon: Evans Lance, MD;  Medtronic secure dual-chamber ICD serial number [id number]   . IR GASTROSTOMY TUBE MOD SED  04/19/2017  . KNEE ARTHROSCOPY     right; "just went in and scraped it"  . LEAD INSERTION N/A 03/02/2017   Procedure: LEAD INSERTION;  Surgeon: Evans Lance, MD;  Location: [place];  Service: Cardiovascular;  Laterality: N/A;  . MASS EXCISION Right 05/10/2013   Procedure: EXCISION MASS RIGHT THUMB;  Surgeon: Wynonia Sours, MD;  Location: [place];  Service: Orthopedics;  Laterality: Right;  . PROXIMAL INTERPHALANGEAL FUSION (PIP) Right 05/10/2013   Procedure: DEBRIDEMENT PROXIMAL INTERPHALANGEAL FUSION (PIP);  Surgeon: Wynonia Sours, MD;  Location: [place];  Service: Orthopedics;  Laterality: Right;  . TONSILLECTOMY          Social History   Socioeconomic History  . Marital status: Divorced    Spouse name: Not on file  . Number of children: 2  . Years of education: Not on file  . Highest education level: Not on file  Occupational History  . Occupation: Sports coach: RETIRED  Social Needs  . Financial resource strain: Not on file  . Food insecurity:    Worry: Not on file    Inability: Not on file  . Transportation needs:    Medical: Not on file    Non-medical: Not on file  Tobacco Use  . Smoking status: Former Smoker    Packs/day: 2.00    Years: 30.00    Pack years: 60.00    Types: Cigarettes    Last attempt to quit: 06/23/1976    Years since quitting: 41.1  . Smokeless tobacco: Never Used  Substance and Sexual Activity  . Alcohol use: No  . Drug use: No  . Sexual activity: Never  Lifestyle  . Physical activity:    Days per week: Not on file    Minutes per session: Not on file  . Stress: Not on file  Relationships  . Social connections:    Talks on phone: Not on file    Gets together: Not on file    Attends religious service: Not on file    Active member of club or organization: Not on file    Attends  meetings of clubs or organizations: Not on file    Relationship status: Not on file  . Intimate partner violence:    Fear of current or ex partner: Not on file    Emotionally abused: Not on file    Physically abused: Not on file    Forced sexual activity: Not on file  Other Topics Concern  . Not on file  Social History Narrative  . Not on file   Family History  Problem Relation Age of Onset  . Tuberculosis Mother   . Tuberculosis Father   . Heart disease Brother   . Diabetes Sister   .  Diabetes Brother   . Clotting disorder Brother       VITAL SIGNS BP (!) 97/55   Pulse 71   Temp 97.9 F (36.6 C)   Resp 18   Ht '6\' 1"'  (1.854 m)   Wt 151 lb 9.6 oz (68.8 kg)   SpO2 94%   BMI 20.00 kg/m   Outpatient Encounter Medications as of 07/31/2017  Medication Sig  . acetaminophen (TYLENOL) 650 MG suppository Place 1 suppository (650 mg total) rectally every 6 (six) hours as needed for fever or mild pain (T over 101).  . ADVAIR DISKUS 250-50 MCG/DOSE AEPB Inhale 1 puff into the lungs 2 (two) times daily.   . Artificial Saliva (BIOTENE MOISTURIZING MOUTH) SOLN Give 1 swab by mouth every 4 hours while awake  . clarithromycin (BIAXIN) 500 MG tablet Place 500 mg into feeding tube 2 (two) times daily.  . digoxin (LANOXIN) 0.05 MG/ML solution Place 2.5 mLs (0.125 mg total) into feeding tube daily.  . fluticasone (FLONASE) 50 MCG/ACT nasal spray Place 1 spray into both nostrils daily as needed for allergies.  . furosemide (LASIX) 10 MG/ML solution Give 2 ml (70m) via PEG - Tube daily  . gabapentin (NEURONTIN) 100 MG capsule Give 1 Capsule (1077m to 3 capsules (30022menterally at bedtime as needed  . ipratropium (ATROVENT) 0.03 % nasal spray Place 1 spray into both nostrils 3 (three) times daily before meals.  . iMarland Kitchenratropium-albuterol (DUONEB) 0.5-2.5 (3) MG/3ML SOLN Take 3 mLs by nebulization every 4 (four) hours as needed.  . lansoprazole (PREVACID) 15 MG capsule Take 15 mg by mouth daily  at 12 noon.  . Lisinopril 1 MG/ML SOLN Place 2.5 mg into feeding tube daily.  . lMarland Kitchenratadine (CLARITIN) 10 MG tablet Take 1 tablet (10 mg total) by mouth daily as needed for allergies.  . mMarland Kitchennthol-cetylpyridinium (CEPACOL) 3 MG lozenge Take 1 lozenge by mouth every 2 (two) hours as needed for sore throat.  . metoprolol tartrate (LOPRESSOR) 25 mg/10 mL SUSP Place 10 mLs (25 mg total) into feeding tube 2 (two) times daily.  . nitroGLYCERIN (NITROSTAT) 0.4 MG SL tablet Place 1 tablet (0.4 mg total) under the tongue every 5 (five) minutes as needed for chest pain.  . Nutritional Supplements (FEEDING SUPPLEMENT, JEVITY 1.5 CAL,) LIQD Jevity 1.5 -  Give 240cc  Six times daily  . Nutritional Supplements (NUTRITIONAL SUPPLEMENT PO) NPO - nothing by mouth diet, Nothing by mouth texture HSG Puree diet - HSG Puree texture, thickened Fluids consistency  . omeprazole (PRILOSEC) 20 MG capsule Give 1 capsule enterally daily  . ondansetron (ZOFRAN) 4 MG tablet Place 4 mg into feeding tube every 6 (six) hours as needed for nausea or vomiting.  . polyethylene glycol (MIRALAX / GLYCOLAX) packet Place 17 g into feeding tube daily.  . potassium chloride 20 MEQ/15ML (10%) SOLN Place 7.5 mLs (10 mEq total) into feeding tube daily.  . RESTASIS 0.05 % ophthalmic emulsion INSTILL 1 DROP INTO BOTH EYES TWICE A DAY  . tiotropium (SPIRIVA) 18 MCG inhalation capsule Place 18 mcg into inhaler and inhale daily.  . traMADol (ULTRAM) 50 MG tablet Give 1 tablet by mouth for moderate pain or 2 tablets by mouth for severe pain every 6 hours as needed  . Water For Irrigation, Sterile (FREE WATER) SOLN Place into feeding tube every 4 (four) hours. 120 ml H2o flushes via tube every 4 hours  . [DISCONTINUED] amoxicillin (AMOXIL) 500 MG tablet Take 500 mg by mouth 2 (two) times daily. Give  2 tablets via G-Tube two times daily until 07/31/17   No facility-administered encounter medications on file as of 07/31/2017.      SIGNIFICANT DIAGNOSTIC  EXAMS  PREVIOUS:   03-26-17: ct of chest abdomen and pelvis: 1. No acute/traumatic intrathoracic, abdominal, or pelvic pathology. 2. Nonocclusive pulmonary embolus in the subsegmental right lower lobe pulmonary artery branches likely old PE or scarring. Nonocclusive acute PE is not entirely excluded. Clinical correlation is recommended. 3. Mild cardiomegaly with dilatation of the right atrium. 4. 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 Small right pleural effusion and stable right lung base round atelectasis/scarring. 5. Aortic Atherosclerosis  and Emphysema    04-07-17: chest x-ray: Suspected patchy infiltrate at the right base, short interval radiographic follow-up suggested to ensure clearing. Cardiomegaly.  04-09-17: chest x-ray: COPD, cardiomegaly, stable. Increasing right basilar airspace opacity and small right effusion concerning for pneumonia  04-10-17: swallow study: Recommend NPO with short term alternative nutrition and continued ST   04-19-17: PEG tube insertion   05-01-17: chest x-ray: mild right basilar infiltrate with trace right sided effusion  05-23-17: chest x-ray: COPD changes with mild atelectasis and small pleural effusion versus pleural thickening at RIGHT base. Improved RIGHT basilar infiltrates since previous exam.  06-19-17: swallow study: Recommend pt continue po and dysphagia treatment with SLP. Once compensation strategies are generalized, suspect pt may tolerate comfort po intake independently. As his strong "hock" is protective of his airway, suspect care plan may be able to be more liberal. Anticipate however that pt will continue to require PEG tube for nutrition.    NO NEW EXAMS.     LABS REVIEWED: PREVIOUS:   04-07-17: wbc 9.5; hgb 13.4; hct 41.9; mcv 94.2; plt 101; glucose 84; bun 18; creat 1.28 ;k+ 4.0; na++ 136; ca 8.2; total bili 2.5; albumin 3.2; rapid flu: +A; blood culture: no growth; urine culture: <10,000 colonies 04-09-17:  wbc 9.0; hgb 11.5; hct 35.1; mcv 91.9; plt 120; glucose 88; bun 9; creat 0.86; k+ 3.5; na++ 133; ca 7.5; mag 1.7 04-13-17: wbc 9.4; hgb 13.8; hct 42.6; mcv 94.0; plt 217; glucose 115; bun 11; creat 1.04 ;k+ 3.3; na++144; ca 8.7 04-16-17: wbc 6.5; hgb 12.2; hct 38.5; mcv 95.3; plt 185; glucose 115; bun 11; creat 0.78; k+ 4.0; na++ 143; ca 8.9; mag 1.7  04-20-17:wbc 12.4; hgb 12.2; hct 38.0; mcv 93.6; plt 209; glucose 110; bun 18; creat 0.95; k+ 4.7; na++ 136; ca 8.4; mag 2.7;  04-22-17: mag 1.9 05-02-17: wbc 7.1; hgb 11.8; hct 34.0; mcv 87.6; plt 223; glucose 130; bun 22.2; creat 0.71; k+ 4.4; na++ 143; ca 9.1; alk phos 123; albumin 3.4; tsh 0.91  07-11-17: H pylori: IgG AB: 0.43; IgA AB: 20.1 (hi): IgM abs: <9.0  NO NEW LABS.    Review of Systems  Constitutional: Negative for malaise/fatigue.  Respiratory: Negative for cough and shortness of breath.   Cardiovascular: Negative for chest pain, palpitations and leg swelling.  Gastrointestinal: Negative for abdominal pain, constipation and heartburn.  Musculoskeletal: Negative for back pain, joint pain and myalgias.  Skin: Negative.   Neurological: Negative for dizziness.  Psychiatric/Behavioral: The patient is not nervous/anxious.     Physical Exam  Constitutional: He is oriented to person, place, and time. He appears well-developed and well-nourished. No distress.  Neck: No thyromegaly present.  Cardiovascular: Normal rate, regular rhythm and intact distal pulses.  Murmur heard. 1/6  Pulmonary/Chest: Effort normal and breath sounds normal. No respiratory distress.  Abdominal: Soft. Bowel sounds  are normal. He exhibits no distension. There is no tenderness.  Peg tube present without signs of infection present   Musculoskeletal: Normal range of motion. He exhibits no edema.  Using walker   Lymphadenopathy:    He has no cervical adenopathy.  Neurological: He is alert and oriented to person, place, and time.  Skin: Skin is warm and dry. He is  not diaphoretic.  Psychiatric: He has a normal mood and affect.     ASSESSMENT/ PLAN:   TODAY;  1. Gastroesophageal reflux disease without esophagitis:  2.  Pharyngoesophageal dysphagia: has chronic aspiration:   Will allow him pleasure food with purred and nectar thick and will allow for suckers.    Time spent with patient and family: 30 minutes: discussed code status; aspiration risks; tube feeding. Verbalized understanding.     MD is aware of resident's narcotic use and is in agreement with current plan of care. We will attempt to wean resident as apropriate   Ok Edwards NP [facility]  Contact [phone] Monday through Friday 8am- 5pm  After hours call [phone]

## 2017-08-01 DIAGNOSIS — J9601 Acute respiratory failure with hypoxia: Secondary | ICD-10-CM | POA: Diagnosis not present

## 2017-08-01 DIAGNOSIS — M6281 Muscle weakness (generalized): Secondary | ICD-10-CM | POA: Diagnosis not present

## 2017-08-01 DIAGNOSIS — R1314 Dysphagia, pharyngoesophageal phase: Secondary | ICD-10-CM | POA: Diagnosis not present

## 2017-08-01 DIAGNOSIS — E440 Moderate protein-calorie malnutrition: Secondary | ICD-10-CM | POA: Diagnosis not present

## 2017-08-01 DIAGNOSIS — K219 Gastro-esophageal reflux disease without esophagitis: Secondary | ICD-10-CM | POA: Diagnosis not present

## 2017-08-02 DIAGNOSIS — R1314 Dysphagia, pharyngoesophageal phase: Secondary | ICD-10-CM | POA: Diagnosis not present

## 2017-08-02 DIAGNOSIS — J9601 Acute respiratory failure with hypoxia: Secondary | ICD-10-CM | POA: Diagnosis not present

## 2017-08-02 DIAGNOSIS — E440 Moderate protein-calorie malnutrition: Secondary | ICD-10-CM | POA: Diagnosis not present

## 2017-08-02 DIAGNOSIS — K219 Gastro-esophageal reflux disease without esophagitis: Secondary | ICD-10-CM | POA: Diagnosis not present

## 2017-08-02 DIAGNOSIS — M6281 Muscle weakness (generalized): Secondary | ICD-10-CM | POA: Diagnosis not present

## 2017-08-07 DIAGNOSIS — R1314 Dysphagia, pharyngoesophageal phase: Secondary | ICD-10-CM | POA: Diagnosis not present

## 2017-08-07 DIAGNOSIS — E440 Moderate protein-calorie malnutrition: Secondary | ICD-10-CM | POA: Diagnosis not present

## 2017-08-07 DIAGNOSIS — M6281 Muscle weakness (generalized): Secondary | ICD-10-CM | POA: Diagnosis not present

## 2017-08-07 DIAGNOSIS — K219 Gastro-esophageal reflux disease without esophagitis: Secondary | ICD-10-CM | POA: Diagnosis not present

## 2017-08-07 DIAGNOSIS — J9601 Acute respiratory failure with hypoxia: Secondary | ICD-10-CM | POA: Diagnosis not present

## 2017-08-08 DIAGNOSIS — K219 Gastro-esophageal reflux disease without esophagitis: Secondary | ICD-10-CM | POA: Diagnosis not present

## 2017-08-08 DIAGNOSIS — E440 Moderate protein-calorie malnutrition: Secondary | ICD-10-CM | POA: Diagnosis not present

## 2017-08-08 DIAGNOSIS — M6281 Muscle weakness (generalized): Secondary | ICD-10-CM | POA: Diagnosis not present

## 2017-08-08 DIAGNOSIS — R1314 Dysphagia, pharyngoesophageal phase: Secondary | ICD-10-CM | POA: Diagnosis not present

## 2017-08-08 DIAGNOSIS — J9601 Acute respiratory failure with hypoxia: Secondary | ICD-10-CM | POA: Diagnosis not present

## 2017-08-09 DIAGNOSIS — R1314 Dysphagia, pharyngoesophageal phase: Secondary | ICD-10-CM | POA: Diagnosis not present

## 2017-08-09 DIAGNOSIS — J9601 Acute respiratory failure with hypoxia: Secondary | ICD-10-CM | POA: Diagnosis not present

## 2017-08-09 DIAGNOSIS — E440 Moderate protein-calorie malnutrition: Secondary | ICD-10-CM | POA: Diagnosis not present

## 2017-08-09 DIAGNOSIS — K219 Gastro-esophageal reflux disease without esophagitis: Secondary | ICD-10-CM | POA: Diagnosis not present

## 2017-08-09 DIAGNOSIS — M6281 Muscle weakness (generalized): Secondary | ICD-10-CM | POA: Diagnosis not present

## 2017-08-15 ENCOUNTER — Encounter: Payer: Self-pay | Admitting: Adult Health

## 2017-08-15 ENCOUNTER — Non-Acute Institutional Stay (SKILLED_NURSING_FACILITY): Payer: Medicare Other | Admitting: Adult Health

## 2017-08-15 DIAGNOSIS — Z95810 Presence of automatic (implantable) cardiac defibrillator: Secondary | ICD-10-CM

## 2017-08-15 DIAGNOSIS — I482 Chronic atrial fibrillation, unspecified: Secondary | ICD-10-CM

## 2017-08-15 DIAGNOSIS — I2782 Chronic pulmonary embolism: Secondary | ICD-10-CM

## 2017-08-15 DIAGNOSIS — R627 Adult failure to thrive: Secondary | ICD-10-CM | POA: Diagnosis not present

## 2017-08-15 DIAGNOSIS — I5022 Chronic systolic (congestive) heart failure: Secondary | ICD-10-CM | POA: Diagnosis not present

## 2017-08-15 NOTE — Progress Notes (Signed)
Location:   [facility] Room Number: 205 A Place of Service:  SNF (31)   CODE STATUS: Full Code  Allergies  Allergen Reactions  . Tamsulosin Other (See Comments)    Dizziness, Made BP very low and weakness  . Celebrex [Celecoxib] Hives and Nausea And Vomiting    Gi upset  . Dronedarone Nausea And Vomiting and Other (See Comments)    GI upset, abdominal pain  . Esomeprazole Magnesium Hives and Other (See Comments)    "don't really remember"  . Digoxin Diarrhea    May have caused some diarrhea  . Hydrocodone-Acetaminophen Other (See Comments)    Bad headache  . Protonix [Pantoprazole Sodium] Nausea And Vomiting and Other (See Comments)    Tolerates Dexilant    Chief Complaint  Patient presents with  . Medical Management of Chronic Issues    Chf; FTT: afib; pe    HPI:  He is an 84 year old long term resident of this facility being seen for the management of his chronic illnesses: chf; ftt; afib; pe. He denies any cough; no shortness of breath and no chest pain. There are no nursing concerns at this time.   Past Medical History:  Diagnosis Date  . Abnormal thyroid scan    Abnormal thyroid imaging studies from 11/09/2010, status post ultrasound guided fine needle aspiration of the dominant left inferior thyroid nodule on 12/15/2010. Cytology report showed rare follicular epithelial cells and hemosiderin laden macrophages.  . Adenomatous colon polyp   . AICD (automatic cardioverter/defibrillator) present    a. fx lead; a. s/p lead extraction 03/02/17  . Arthritis    "all over"  . Asthma   . Atrial fibrillation ([place])    on chronic Coumadin; stopped July 2013 due to subdural hematomas  . CHF (congestive heart failure) (HCC)    EF 35-40% s/p most recent ICD generator change-out with Medtronic dual-chamber ICD 05/20/11 with explantation of previous abdominally-implanted device  . Coronary artery disease    s/p CABG 1983 and PCI/stent 2004.   . Diabetes  mellitus    diet controlled  . Diverticulosis   . Dyslipidemia   . Enteritis   . Erythrocytosis   . GERD (gastroesophageal reflux disease)   . Hepatic steatosis   . Hypertension   . Ischemic cardiomyopathy    WITH CHF  . Monocytosis 04/17/2013  . Myocardial infarction ([place]) 1983; ~ 1990  . Pleural effusion    right  . Pneumonia August 2013  . Portal hypertensive gastropathy ([place])   . Rectal ulcer   . Renal calculi   . Subdural hematoma [facility]) July 2013   Anticoagulation stopped.   . SunDown syndrome   . VT (ventricular tachycardia) ([place])     Past Surgical History:  Procedure Laterality Date  . CHOLECYSTECTOMY    . COLONOSCOPY  07/07/2011   Procedure: COLONOSCOPY;  Surgeon: Jerene Bears, MD;  Location: [facility];  Service: Gastroenterology;  Laterality: N/A;  . CORONARY ANGIOPLASTY WITH STENT PLACEMENT  2004   Tandem Cypher stents LAD  . CORONARY ARTERY BYPASS GRAFT  1983   SVG-mLAD  . ESOPHAGOGASTRODUODENOSCOPY  02/11/2011   Procedure: ESOPHAGOGASTRODUODENOSCOPY (EGD);  Surgeon: Beryle Beams, MD;  Location: [facility];  Service: Endoscopy;  Laterality: N/A;  . FLEXIBLE SIGMOIDOSCOPY N/A 02/08/2017   Procedure: FLEXIBLE SIGMOIDOSCOPY;  Surgeon: Irene Shipper, MD;  Location: [facility];  Service: Endoscopy;  Laterality: N/A;  . ICD GENERATOR CHANGEOUT N/A 04/08/2016   Procedure: ICD Nature conservation officer;  Surgeon:  Evans Lance, MD;  Location: [place] [facility];  Service: Cardiovascular;  Laterality: N/A;  . ICD LEAD REMOVAL N/A 03/02/2017   Procedure: ICD LEAD REMOVAL;  Surgeon: Evans Lance, MD;  Location: [place];  Service: Cardiovascular;  Laterality: N/A;  . IMPLANTABLE CARDIOVERTER DEFIBRILLATOR (ICD) GENERATOR CHANGE N/A 05/20/2011   Procedure: ICD GENERATOR CHANGE;  Surgeon: Evans Lance, MD;  Medtronic secure dual-chamber ICD serial number [id number]   . IR GASTROSTOMY TUBE MOD SED  04/19/2017  . KNEE ARTHROSCOPY     right; "just went in and scraped it"  . LEAD  INSERTION N/A 03/02/2017   Procedure: LEAD INSERTION;  Surgeon: Evans Lance, MD;  Location: [place];  Service: Cardiovascular;  Laterality: N/A;  . MASS EXCISION Right 05/10/2013   Procedure: EXCISION MASS RIGHT THUMB;  Surgeon: Wynonia Sours, MD;  Location: [place];  Service: Orthopedics;  Laterality: Right;  . PROXIMAL INTERPHALANGEAL FUSION (PIP) Right 05/10/2013   Procedure: DEBRIDEMENT PROXIMAL INTERPHALANGEAL FUSION (PIP);  Surgeon: Wynonia Sours, MD;  Location: [place];  Service: Orthopedics;  Laterality: Right;  . TONSILLECTOMY          Social History   Socioeconomic History  . Marital status: Divorced    Spouse name: Not on file  . Number of children: 2  . Years of education: Not on file  . Highest education level: Not on file  Occupational History  . Occupation: Sports coach: RETIRED  Social Needs  . Financial resource strain: Not on file  . Food insecurity:    Worry: Not on file    Inability: Not on file  . Transportation needs:    Medical: Not on file    Non-medical: Not on file  Tobacco Use  . Smoking status: Former Smoker    Packs/day: 2.00    Years: 30.00    Pack years: 60.00    Types: Cigarettes    Last attempt to quit: 06/23/1976    Years since quitting: 41.1  . Smokeless tobacco: Never Used  Substance and Sexual Activity  . Alcohol use: No  . Drug use: No  . Sexual activity: Never  Lifestyle  . Physical activity:    Days per week: Not on file    Minutes per session: Not on file  . Stress: Not on file  Relationships  . Social connections:    Talks on phone: Not on file    Gets together: Not on file    Attends religious service: Not on file    Active member of club or organization: Not on file    Attends meetings of clubs or organizations: Not on file    Relationship status: Not on file  . Intimate partner violence:    Fear of current or ex partner: Not on file    Emotionally abused: Not on file     Physically abused: Not on file    Forced sexual activity: Not on file  Other Topics Concern  . Not on file  Social History Narrative  . Not on file   Family History  Problem Relation Age of Onset  . Tuberculosis Mother   . Tuberculosis Father   . Heart disease Brother   . Diabetes Sister   . Diabetes Brother   . Clotting disorder Brother       VITAL SIGNS BP 132/78   Pulse 72   Temp 98 F (36.7 C)   Resp 20  Ht '6\' 1"'  (1.854 m)   Wt 151 lb 9.6 oz (68.8 kg)   SpO2 97%   BMI 20.00 kg/m   Outpatient Encounter Medications as of 08/15/2017  Medication Sig  . acetaminophen (TYLENOL) 650 MG suppository Place 1 suppository (650 mg total) rectally every 6 (six) hours as needed for fever or mild pain (T over 101).  . ADVAIR DISKUS 250-50 MCG/DOSE AEPB Inhale 1 puff into the lungs 2 (two) times daily.   . Artificial Saliva (BIOTENE MOISTURIZING MOUTH) SOLN Give 1 swab by mouth every 4 hours while awake  . digoxin (LANOXIN) 0.05 MG/ML solution Place 2.5 mLs (0.125 mg total) into feeding tube daily.  . furosemide (LASIX) 10 MG/ML solution Give 2 ml (21m) via PEG - Tube daily  . ipratropium (ATROVENT) 0.03 % nasal spray Place 1 spray into both nostrils 3 (three) times daily before meals.  .Marland Kitchenipratropium-albuterol (DUONEB) 0.5-2.5 (3) MG/3ML SOLN Take 3 mLs by nebulization every 4 (four) hours as needed.  . Lisinopril 1 MG/ML SOLN Place 2.5 mg into feeding tube daily.  . metoprolol tartrate (LOPRESSOR) 25 mg/10 mL SUSP Place 10 mLs (25 mg total) into feeding tube 2 (two) times daily.  . nitroGLYCERIN (NITROSTAT) 0.4 MG SL tablet Place 1 tablet (0.4 mg total) under the tongue every 5 (five) minutes as needed for chest pain.  . Nutritional Supplements (FEEDING SUPPLEMENT, JEVITY 1.5 CAL,) LIQD Jevity 1.5 -  Give 240cc  Six times daily  . Nutritional Supplements (NUTRITIONAL SUPPLEMENT PO) NPO - nothing by mouth diet, Nothing by mouth texture HSG Puree diet - HSG Puree texture, thickened  Fluids consistency  . omeprazole (PRILOSEC) 20 MG capsule Give 1 capsule enterally daily  . ondansetron (ZOFRAN) 4 MG tablet Place 4 mg into feeding tube every 6 (six) hours as needed for nausea or vomiting.  . polyethylene glycol (MIRALAX / GLYCOLAX) packet Place 17 g into feeding tube daily.  . potassium chloride 20 MEQ/15ML (10%) SOLN Place 7.5 mLs (10 mEq total) into feeding tube daily.  . RESTASIS 0.05 % ophthalmic emulsion INSTILL 1 DROP INTO BOTH EYES TWICE A DAY  . tiotropium (SPIRIVA) 18 MCG inhalation capsule Place 18 mcg into inhaler and inhale daily.  . traMADol (ULTRAM) 50 MG tablet Give 1 tablet by mouth for moderate pain or 2 tablets by mouth for severe pain every 6 hours as needed  . Water For Irrigation, Sterile (FREE WATER) SOLN Place into feeding tube every 4 (four) hours. 120 ml H2o flushes via tube every 4 hours   No facility-administered encounter medications on file as of 08/15/2017.      SIGNIFICANT DIAGNOSTIC EXAMS  PREVIOUS:   03-26-17: ct of chest abdomen and pelvis: 1. No acute/traumatic intrathoracic, abdominal, or pelvic pathology. 2. Nonocclusive pulmonary embolus in the subsegmental right lower lobe pulmonary artery branches likely old PE or scarring. Nonocclusive acute PE is not entirely excluded. Clinical correlation is recommended. 3. Mild cardiomegaly with dilatation of the right atrium. 4. 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 Small right pleural effusion and stable right lung base round atelectasis/scarring. 5. Aortic Atherosclerosis  and Emphysema    04-07-17: chest x-ray: Suspected patchy infiltrate at the right base, short interval radiographic follow-up suggested to ensure clearing. Cardiomegaly.  04-09-17: chest x-ray: COPD, cardiomegaly, stable. Increasing right basilar airspace opacity and small right effusion concerning for pneumonia  04-10-17: swallow study: Recommend NPO with short term alternative nutrition and  continued ST   04-19-17: PEG tube insertion  05-01-17: chest x-ray: mild right basilar infiltrate with trace right sided effusion  05-23-17: chest x-ray: COPD changes with mild atelectasis and small pleural effusion versus pleural thickening at RIGHT base. Improved RIGHT basilar infiltrates since previous exam.  06-19-17: swallow study: Recommend pt continue po and dysphagia treatment with SLP. Once compensation strategies are generalized, suspect pt may tolerate comfort po intake independently. As his strong "hock" is protective of his airway, suspect care plan may be able to be more liberal. Anticipate however that pt will continue to require PEG tube for nutrition.    NO NEW EXAMS.     LABS REVIEWED: PREVIOUS:   04-07-17: wbc 9.5; hgb 13.4; hct 41.9; mcv 94.2; plt 101; glucose 84; bun 18; creat 1.28 ;k+ 4.0; na++ 136; ca 8.2; total bili 2.5; albumin 3.2; rapid flu: +A; blood culture: no growth; urine culture: <10,000 colonies 04-09-17: wbc 9.0; hgb 11.5; hct 35.1; mcv 91.9; plt 120; glucose 88; bun 9; creat 0.86; k+ 3.5; na++ 133; ca 7.5; mag 1.7 04-13-17: wbc 9.4; hgb 13.8; hct 42.6; mcv 94.0; plt 217; glucose 115; bun 11; creat 1.04 ;k+ 3.3; na++144; ca 8.7 04-16-17: wbc 6.5; hgb 12.2; hct 38.5; mcv 95.3; plt 185; glucose 115; bun 11; creat 0.78; k+ 4.0; na++ 143; ca 8.9; mag 1.7  04-20-17:wbc 12.4; hgb 12.2; hct 38.0; mcv 93.6; plt 209; glucose 110; bun 18; creat 0.95; k+ 4.7; na++ 136; ca 8.4; mag 2.7;  04-22-17: mag 1.9 05-02-17: wbc 7.1; hgb 11.8; hct 34.0; mcv 87.6; plt 223; glucose 130; bun 22.2; creat 0.71; k+ 4.4; na++ 143; ca 9.1; alk phos 123; albumin 3.4; tsh 0.91  06-16-17: wbc 8.7; hgb 13.9; hct 42.2; mcv 91.3; plt 179 07-11-17: H pylori: IgG AB: 0.43; IgA AB: 20.1 (hi): IgM abs: <9.0  NO NEW LABS.    Review of Systems  Constitutional: Negative for malaise/fatigue.  Respiratory: Negative for cough and shortness of breath.   Cardiovascular: Negative for chest pain, palpitations and  leg swelling.  Gastrointestinal: Negative for abdominal pain, constipation and heartburn.  Musculoskeletal: Negative for back pain, joint pain and myalgias.  Skin: Negative.   Neurological: Negative for dizziness.  Psychiatric/Behavioral: The patient is not nervous/anxious.     Physical Exam  Constitutional: He is oriented to person, place, and time. He appears well-developed and well-nourished. No distress.  Neck: No thyromegaly present.  Cardiovascular: Normal rate, regular rhythm and intact distal pulses.  Murmur heard. 1/6  Pulmonary/Chest: Effort normal and breath sounds normal. No respiratory distress.  Abdominal: Soft. Bowel sounds are normal. He exhibits no distension. There is no tenderness.  Peg tube present without signs of infection present   Musculoskeletal: Normal range of motion. He exhibits no edema.  Lymphadenopathy:    He has no cervical adenopathy.  Neurological: He is alert and oriented to person, place, and time.  Uses walker   Skin: Skin is warm and dry. He is not diaphoretic.  Psychiatric: He has a normal mood and affect.      ASSESSMENT/ PLAN:  TODAY;  1.  Adult failure to thrive: weight is 151 pounds; albumin is 3.4; without change in status; will continue to monitor  2. History of right lung PE; without change he is not a candidate for anticoagulation therapy due to history of falls and subdural hematoma.     3. Chronic systolic heart failure: EF 35-40%: is status post AICD placement:  is stable will continue lasix 20 mg daily with k+ 10 meq daily  lopressor 25 mg  twice daily and will monitor his status.   4. Atrial fibrillation with controlled ventricular response: has history of PE  heart rate stable: is not a candidate for anticoagulations due to history of subdural hematoma: will continue lopressor 25 mg twice daily and digoxin 0.125 mg daily for rate control    PREVIOUS    5. Coronary artery disease involving native coronary artery with angina  pectoris: is status post CABG/ stent placements: is stable will continue lopressor 25 mg twice daily and has prn ntg.   6. Hypertensive heart disease with heart failure: stable b/p 132/78 will continue lopressor 25 mg twice daily and lisinopril 2.5 mg daily  7. COPD( emphysema): is stable: is now on 02 at 2L/[place] as needed to keep sats >91 %. Will continue advair 250/50 twice daily  spiriva 18 mcg daily duoneb every 6 hours as needed   8. Chronic allergic rhinitis: stable will monitor   9. Polyneuropathy : is stable will monitor   10. Gastroesophageal reflux disease without esophagitis: stable will continue prilosec 20 mg daily    11. Chronic constipation: stable will continue miralax daily   12.  Pharyngoesophageal dysphagia: has chronic aspiration: is currently NPO and is receiving jevity tube feeding. For his dry mouth will continue biotene every 4 hours while awake and will monitor his status.  Will continue atrovent nasal spray prior to meals   13. CKD stage III: stable bun 22.2/creat 0.71   MD is aware of resident's narcotic use and is in agreement with current plan of care. We will attempt to wean resident as apropriate   Ok Edwards NP [facility]  Contact [phone] Monday through Friday 8am- 5pm  After hours call [phone]

## 2017-08-16 DIAGNOSIS — I4891 Unspecified atrial fibrillation: Secondary | ICD-10-CM | POA: Diagnosis not present

## 2017-08-16 DIAGNOSIS — I10 Essential (primary) hypertension: Secondary | ICD-10-CM | POA: Diagnosis not present

## 2017-08-24 DIAGNOSIS — G3184 Mild cognitive impairment, so stated: Secondary | ICD-10-CM | POA: Diagnosis not present

## 2017-08-28 ENCOUNTER — Non-Acute Institutional Stay (SKILLED_NURSING_FACILITY): Payer: Medicare Other | Admitting: Adult Health

## 2017-08-28 ENCOUNTER — Encounter: Payer: Self-pay | Admitting: Adult Health

## 2017-08-28 DIAGNOSIS — I110 Hypertensive heart disease with heart failure: Secondary | ICD-10-CM

## 2017-08-28 DIAGNOSIS — J449 Chronic obstructive pulmonary disease, unspecified: Secondary | ICD-10-CM | POA: Diagnosis not present

## 2017-08-28 DIAGNOSIS — I25119 Atherosclerotic heart disease of native coronary artery with unspecified angina pectoris: Secondary | ICD-10-CM

## 2017-08-28 DIAGNOSIS — J309 Allergic rhinitis, unspecified: Secondary | ICD-10-CM | POA: Diagnosis not present

## 2017-08-28 NOTE — Progress Notes (Signed)
Location:   [facility] Room Number: 205 A Place of Service:  SNF (31)   CODE STATUS: Full Code  Allergies  Allergen Reactions  . Tamsulosin Other (See Comments)    Dizziness, Made BP very low and weakness  . Celebrex [Celecoxib] Hives and Nausea And Vomiting    Gi upset  . Dronedarone Nausea And Vomiting and Other (See Comments)    GI upset, abdominal pain  . Esomeprazole Magnesium Hives and Other (See Comments)    "don't really remember"  . Digoxin Diarrhea    May have caused some diarrhea  . Hydrocodone-Acetaminophen Other (See Comments)    Bad headache  . Protonix [Pantoprazole Sodium] Nausea And Vomiting and Other (See Comments)    Tolerates Dexilant    Chief Complaint  Patient presents with  . Medical Management of Chronic Issues    Cad; hypertensive heart disease; copd; allergic rhinitis     HPI:  He is a 84 year old long term resident of this facility being seen for the management of his chronic illnesses: cad; hypertensive heart disease; copd; allergic rhinitis. He denies any worsening sputum production; he denies sinus congestion. He complain of having diarrheal stools after his tube feedings. He denies any chest pain; there are no nursing concerns at this time.    Past Medical History:  Diagnosis Date  . Abnormal thyroid scan    Abnormal thyroid imaging studies from 11/09/2010, status post ultrasound guided fine needle aspiration of the dominant left inferior thyroid nodule on 12/15/2010. Cytology report showed rare follicular epithelial cells and hemosiderin laden macrophages.  . Adenomatous colon polyp   . AICD (automatic cardioverter/defibrillator) present    a. fx lead; a. s/p lead extraction 03/02/17  . Arthritis    "all over"  . Asthma   . Atrial fibrillation ([place])    on chronic Coumadin; stopped July 2013 due to subdural hematomas  . CHF (congestive heart failure) (HCC)    EF 35-40% s/p most recent ICD generator change-out with  Medtronic dual-chamber ICD 05/20/11 with explantation of previous abdominally-implanted device  . Coronary artery disease    s/p CABG 1983 and PCI/stent 2004.   . Diabetes mellitus    diet controlled  . Diverticulosis   . Dyslipidemia   . Enteritis   . Erythrocytosis   . GERD (gastroesophageal reflux disease)   . Hepatic steatosis   . Hypertension   . Ischemic cardiomyopathy    WITH CHF  . Monocytosis 04/17/2013  . Myocardial infarction ([place]) 1983; ~ 1990  . Pleural effusion    right  . Pneumonia August 2013  . Portal hypertensive gastropathy ([place])   . Rectal ulcer   . Renal calculi   . Subdural hematoma [facility] July 2013   Anticoagulation stopped.   . SunDown syndrome   . VT (ventricular tachycardia) ([place])     Past Surgical History:  Procedure Laterality Date  . CHOLECYSTECTOMY    . COLONOSCOPY  07/07/2011   Procedure: COLONOSCOPY;  Surgeon: Jerene Bears, MD;  Location: [facility];  Service: Gastroenterology;  Laterality: N/A;  . CORONARY ANGIOPLASTY WITH STENT PLACEMENT  2004   Tandem Cypher stents LAD  . CORONARY ARTERY BYPASS GRAFT  1983   SVG-mLAD  . ESOPHAGOGASTRODUODENOSCOPY  02/11/2011   Procedure: ESOPHAGOGASTRODUODENOSCOPY (EGD);  Surgeon: Beryle Beams, MD;  Location: [facility];  Service: Endoscopy;  Laterality: N/A;  . FLEXIBLE SIGMOIDOSCOPY N/A 02/08/2017   Procedure: FLEXIBLE SIGMOIDOSCOPY;  Surgeon: Irene Shipper, MD;  Location: [facility];  Service: Endoscopy;  Laterality: N/A;  . ICD GENERATOR CHANGEOUT N/A 04/08/2016   Procedure: ICD Generator Changeout;  Surgeon: Evans Lance, MD;  Location: [place] [facility];  Service: Cardiovascular;  Laterality: N/A;  . ICD LEAD REMOVAL N/A 03/02/2017   Procedure: ICD LEAD REMOVAL;  Surgeon: Evans Lance, MD;  Location: [place];  Service: Cardiovascular;  Laterality: N/A;  . IMPLANTABLE CARDIOVERTER DEFIBRILLATOR (ICD) GENERATOR CHANGE N/A 05/20/2011   Procedure: ICD GENERATOR CHANGE;  Surgeon: Evans Lance, MD;   Medtronic secure dual-chamber ICD serial number [id number]   . IR GASTROSTOMY TUBE MOD SED  04/19/2017  . KNEE ARTHROSCOPY     right; "just went in and scraped it"  . LEAD INSERTION N/A 03/02/2017   Procedure: LEAD INSERTION;  Surgeon: Evans Lance, MD;  Location: [place];  Service: Cardiovascular;  Laterality: N/A;  . MASS EXCISION Right 05/10/2013   Procedure: EXCISION MASS RIGHT THUMB;  Surgeon: Wynonia Sours, MD;  Location: [place];  Service: Orthopedics;  Laterality: Right;  . PROXIMAL INTERPHALANGEAL FUSION (PIP) Right 05/10/2013   Procedure: DEBRIDEMENT PROXIMAL INTERPHALANGEAL FUSION (PIP);  Surgeon: Wynonia Sours, MD;  Location: [place];  Service: Orthopedics;  Laterality: Right;  . TONSILLECTOMY          Social History   Socioeconomic History  . Marital status: Divorced    Spouse name: Not on file  . Number of children: 2  . Years of education: Not on file  . Highest education level: Not on file  Occupational History  . Occupation: Sports coach: RETIRED  Social Needs  . Financial resource strain: Not on file  . Food insecurity:    Worry: Not on file    Inability: Not on file  . Transportation needs:    Medical: Not on file    Non-medical: Not on file  Tobacco Use  . Smoking status: Former Smoker    Packs/day: 2.00    Years: 30.00    Pack years: 60.00    Types: Cigarettes    Last attempt to quit: 06/23/1976    Years since quitting: 41.2  . Smokeless tobacco: Never Used  Substance and Sexual Activity  . Alcohol use: No  . Drug use: No  . Sexual activity: Never  Lifestyle  . Physical activity:    Days per week: Not on file    Minutes per session: Not on file  . Stress: Not on file  Relationships  . Social connections:    Talks on phone: Not on file    Gets together: Not on file    Attends religious service: Not on file    Active member of club or organization: Not on file    Attends meetings of clubs or  organizations: Not on file    Relationship status: Not on file  . Intimate partner violence:    Fear of current or ex partner: Not on file    Emotionally abused: Not on file    Physically abused: Not on file    Forced sexual activity: Not on file  Other Topics Concern  . Not on file  Social History Narrative  . Not on file   Family History  Problem Relation Age of Onset  . Tuberculosis Mother   . Tuberculosis Father   . Heart disease Brother   . Diabetes Sister   . Diabetes Brother   . Clotting disorder Brother       VITAL  SIGNS BP 124/70   Pulse 76   Temp 98.2 F (36.8 C)   Resp 18   Ht 6' 1" (1.854 m)   Wt 150 lb (68 kg)   SpO2 97%   BMI 19.79 kg/m   Outpatient Encounter Medications as of 08/28/2017  Medication Sig  . acetaminophen (TYLENOL) 650 MG suppository Place 1 suppository (650 mg total) rectally every 6 (six) hours as needed for fever or mild pain (T over 101).  . ADVAIR DISKUS 250-50 MCG/DOSE AEPB Inhale 1 puff into the lungs 2 (two) times daily.   . Artificial Saliva (BIOTENE MOISTURIZING MOUTH) SOLN Give 1 swab by mouth every 4 hours while awake  . digoxin (LANOXIN) 0.05 MG/ML solution Place 2.5 mLs (0.125 mg total) into feeding tube daily.  . furosemide (LASIX) 10 MG/ML solution Give 2 ml (84m) via PEG - Tube daily  . ipratropium (ATROVENT) 0.03 % nasal spray Place 1 spray into both nostrils 3 (three) times daily before meals.  .Marland Kitchenipratropium-albuterol (DUONEB) 0.5-2.5 (3) MG/3ML SOLN Take 3 mLs by nebulization every 4 (four) hours as needed.  . Lisinopril 1 MG/ML SOLN Place 2.5 mg into feeding tube daily.  . metoprolol tartrate (LOPRESSOR) 25 mg/10 mL SUSP Place 10 mLs (25 mg total) into feeding tube 2 (two) times daily.  . nitroGLYCERIN (NITROSTAT) 0.4 MG SL tablet Place 1 tablet (0.4 mg total) under the tongue every 5 (five) minutes as needed for chest pain.  . Nutritional Supplements (FEEDING SUPPLEMENT, JEVITY 1.5 CAL,) LIQD Jevity 1.5 -  Give 240cc   Six times daily  . Nutritional Supplements (NUTRITIONAL SUPPLEMENT PO) NPO - nothing by mouth diet, Nothing by mouth texture HSG Puree diet - HSG Puree texture, thickened Fluids consistency  . omeprazole (PRILOSEC) 20 MG capsule Give 1 capsule enterally daily  . ondansetron (ZOFRAN) 4 MG tablet Place 4 mg into feeding tube every 6 (six) hours as needed for nausea or vomiting.  . polyethylene glycol (MIRALAX / GLYCOLAX) packet Place 17 g into feeding tube daily.  . potassium chloride 20 MEQ/15ML (10%) SOLN Place 7.5 mLs (10 mEq total) into feeding tube daily.  . RESTASIS 0.05 % ophthalmic emulsion INSTILL 1 DROP INTO BOTH EYES TWICE A DAY  . tiotropium (SPIRIVA) 18 MCG inhalation capsule Place 18 mcg into inhaler and inhale daily.  . traMADol (ULTRAM) 50 MG tablet Give 1 tablet by mouth for moderate pain or 2 tablets by mouth for severe pain every 6 hours as needed  . Water For Irrigation, Sterile (FREE WATER) SOLN Place into feeding tube every 4 (four) hours. 120 ml H2o flushes via tube every 4 hours   No facility-administered encounter medications on file as of 08/28/2017.      SIGNIFICANT DIAGNOSTIC EXAMS  PREVIOUS:   03-26-17: ct of chest abdomen and pelvis: 1. No acute/traumatic intrathoracic, abdominal, or pelvic pathology. 2. Nonocclusive pulmonary embolus in the subsegmental right lower lobe pulmonary artery branches likely old PE or scarring. Nonocclusive acute PE is not entirely excluded. Clinical correlation is recommended. 3. Mild cardiomegaly with dilatation of the right atrium. 4. 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 Small right pleural effusion and stable right lung base round atelectasis/scarring. 5. Aortic Atherosclerosis  and Emphysema    04-07-17: chest x-ray: Suspected patchy infiltrate at the right base, short interval radiographic follow-up suggested to ensure clearing. Cardiomegaly.  04-09-17: chest x-ray: COPD, cardiomegaly,  stable. Increasing right basilar airspace opacity and small right effusion concerning for pneumonia  04-10-17: swallow  study: Recommend NPO with short term alternative nutrition and continued ST   04-19-17: PEG tube insertion   05-01-17: chest x-ray: mild right basilar infiltrate with trace right sided effusion  05-23-17: chest x-ray: COPD changes with mild atelectasis and small pleural effusion versus pleural thickening at RIGHT base. Improved RIGHT basilar infiltrates since previous exam.  06-19-17: swallow study: Recommend pt continue po and dysphagia treatment with SLP. Once compensation strategies are generalized, suspect pt may tolerate comfort po intake independently. As his strong "hock" is protective of his airway, suspect care plan may be able to be more liberal. Anticipate however that pt will continue to require PEG tube for nutrition.    NO NEW EXAMS.     LABS REVIEWED: PREVIOUS:   04-07-17: wbc 9.5; hgb 13.4; hct 41.9; mcv 94.2; plt 101; glucose 84; bun 18; creat 1.28 ;k+ 4.0; na++ 136; ca 8.2; total bili 2.5; albumin 3.2; rapid flu: +A; blood culture: no growth; urine culture: <10,000 colonies 04-09-17: wbc 9.0; hgb 11.5; hct 35.1; mcv 91.9; plt 120; glucose 88; bun 9; creat 0.86; k+ 3.5; na++ 133; ca 7.5; mag 1.7 04-13-17: wbc 9.4; hgb 13.8; hct 42.6; mcv 94.0; plt 217; glucose 115; bun 11; creat 1.04 ;k+ 3.3; na++144; ca 8.7 04-16-17: wbc 6.5; hgb 12.2; hct 38.5; mcv 95.3; plt 185; glucose 115; bun 11; creat 0.78; k+ 4.0; na++ 143; ca 8.9; mag 1.7  04-20-17:wbc 12.4; hgb 12.2; hct 38.0; mcv 93.6; plt 209; glucose 110; bun 18; creat 0.95; k+ 4.7; na++ 136; ca 8.4; mag 2.7;  04-22-17: mag 1.9 05-02-17: wbc 7.1; hgb 11.8; hct 34.0; mcv 87.6; plt 223; glucose 130; bun 22.2; creat 0.71; k+ 4.4; na++ 143; ca 9.1; alk phos 123; albumin 3.4; tsh 0.91  06-16-17: wbc 8.7; hgb 13.9; hct 42.2; mcv 91.3; plt 179 07-11-17: H pylori: IgG AB: 0.43; IgA AB: 20.1 (hi): IgM abs: <9.0  NO NEW LABS.     Review of Systems  Constitutional: Negative for malaise/fatigue.  Respiratory: Negative for cough and shortness of breath.   Cardiovascular: Negative for chest pain, palpitations and leg swelling.  Gastrointestinal: Positive for diarrhea. Negative for abdominal pain and heartburn.       Has diarrheal stools after tube feeding   Musculoskeletal: Negative for back pain, joint pain and myalgias.  Skin: Negative.   Neurological: Negative for dizziness.  Psychiatric/Behavioral: The patient is not nervous/anxious.     Physical Exam  Constitutional: He is oriented to person, place, and time. He appears well-developed and well-nourished. No distress.  Neck: No thyromegaly present.  Cardiovascular: Normal rate, regular rhythm and intact distal pulses.  Murmur heard. 1/6  Pulmonary/Chest: Effort normal and breath sounds normal. No respiratory distress.  Abdominal: Soft. Bowel sounds are normal. He exhibits no distension. There is no tenderness.  Peg tube present without signs of infection present    Musculoskeletal: Normal range of motion. He exhibits no edema.  Uses walker   Lymphadenopathy:    He has no cervical adenopathy.  Neurological: He is alert and oriented to person, place, and time.  Skin: Skin is warm and dry. He is not diaphoretic.  Psychiatric: He has a normal mood and affect.       ASSESSMENT/ PLAN:  TODAY;  1. Coronary artery disease involving native coronary artery with angina pectoris: is status post CABG/ stent placements: is stable will continue lopressor 25 mg twice daily and has prn ntg.   2. Hypertensive heart disease with heart failure: stable b/p 124/70 will continue  lopressor 25 mg twice daily and lisinopril 2.5 mg daily  3. COPD( emphysema): is stable: is now on 02 at 2L/Scissors as needed to keep sats >91 %. Will continue advair 250/50 twice daily  spiriva 18 mcg daily duoneb every 6 hours as needed   4. Chronic allergic rhinitis: stable will monitor    PREVIOUS    5. Polyneuropathy : is stable will monitor   6. Gastroesophageal reflux disease without esophagitis: stable will continue prilosec 20 mg daily    7. Chronic constipation: stable will continue miralax daily   8.  Pharyngoesophageal dysphagia: has chronic aspiration: is currently NPO and is receiving jevity tube feeding. For his dry mouth will continue biotene every 4 hours while awake and will monitor his status.  Will continue atrovent nasal spray prior to meals   9. CKD stage III: stable bun 22.2/creat 0.71  10.  Adult failure to thrive: weight is 150 pounds; albumin is 3.4; without change in status; will continue to monitor  11. History of right lung PE; without change he is not a candidate for anticoagulation therapy due to history of falls and subdural hematoma.     12. Chronic systolic heart failure: EF 35-40%: is status post AICD placement:  is stable will continue lasix 20 mg daily with k+ 10 meq daily  lopressor 25 mg twice daily and will monitor his status.   13. Atrial fibrillation with controlled ventricular response: has history of PE  heart rate stable: is not a candidate for anticoagulations due to history of subdural hematoma: will continue lopressor 25 mg twice daily and digoxin 0.125 mg daily for rate control   Will have dietary see him for changing his tube feeding.    MD is aware of resident's narcotic use and is in agreement with current plan of care. We will attempt to wean resident as apropriate   Ok Edwards NP [facility]  Contact [phone] Monday through Friday 8am- 5pm  After hours call [phone]

## 2017-08-30 DIAGNOSIS — M6281 Muscle weakness (generalized): Secondary | ICD-10-CM | POA: Diagnosis not present

## 2017-08-30 DIAGNOSIS — R1314 Dysphagia, pharyngoesophageal phase: Secondary | ICD-10-CM | POA: Diagnosis not present

## 2017-08-30 DIAGNOSIS — K219 Gastro-esophageal reflux disease without esophagitis: Secondary | ICD-10-CM | POA: Diagnosis not present

## 2017-08-30 DIAGNOSIS — J9601 Acute respiratory failure with hypoxia: Secondary | ICD-10-CM | POA: Diagnosis not present

## 2017-08-30 DIAGNOSIS — E440 Moderate protein-calorie malnutrition: Secondary | ICD-10-CM | POA: Diagnosis not present

## 2017-08-31 DIAGNOSIS — A419 Sepsis, unspecified organism: Secondary | ICD-10-CM | POA: Diagnosis not present

## 2017-08-31 DIAGNOSIS — M6281 Muscle weakness (generalized): Secondary | ICD-10-CM | POA: Diagnosis not present

## 2017-08-31 DIAGNOSIS — R627 Adult failure to thrive: Secondary | ICD-10-CM | POA: Diagnosis not present

## 2017-08-31 DIAGNOSIS — J9601 Acute respiratory failure with hypoxia: Secondary | ICD-10-CM | POA: Diagnosis not present

## 2017-08-31 DIAGNOSIS — E440 Moderate protein-calorie malnutrition: Secondary | ICD-10-CM | POA: Diagnosis not present

## 2017-08-31 DIAGNOSIS — R489 Unspecified symbolic dysfunctions: Secondary | ICD-10-CM | POA: Diagnosis not present

## 2017-09-04 DIAGNOSIS — M6281 Muscle weakness (generalized): Secondary | ICD-10-CM | POA: Diagnosis not present

## 2017-09-04 DIAGNOSIS — J9601 Acute respiratory failure with hypoxia: Secondary | ICD-10-CM | POA: Diagnosis not present

## 2017-09-04 DIAGNOSIS — E440 Moderate protein-calorie malnutrition: Secondary | ICD-10-CM | POA: Diagnosis not present

## 2017-09-04 DIAGNOSIS — R489 Unspecified symbolic dysfunctions: Secondary | ICD-10-CM | POA: Diagnosis not present

## 2017-09-04 DIAGNOSIS — A419 Sepsis, unspecified organism: Secondary | ICD-10-CM | POA: Diagnosis not present

## 2017-09-04 DIAGNOSIS — R627 Adult failure to thrive: Secondary | ICD-10-CM | POA: Diagnosis not present

## 2017-09-05 DIAGNOSIS — M6281 Muscle weakness (generalized): Secondary | ICD-10-CM | POA: Diagnosis not present

## 2017-09-05 DIAGNOSIS — Z7951 Long term (current) use of inhaled steroids: Secondary | ICD-10-CM | POA: Diagnosis not present

## 2017-09-05 DIAGNOSIS — H2513 Age-related nuclear cataract, bilateral: Secondary | ICD-10-CM | POA: Diagnosis not present

## 2017-09-05 DIAGNOSIS — J9601 Acute respiratory failure with hypoxia: Secondary | ICD-10-CM | POA: Diagnosis not present

## 2017-09-05 DIAGNOSIS — R489 Unspecified symbolic dysfunctions: Secondary | ICD-10-CM | POA: Diagnosis not present

## 2017-09-05 DIAGNOSIS — E440 Moderate protein-calorie malnutrition: Secondary | ICD-10-CM | POA: Diagnosis not present

## 2017-09-05 DIAGNOSIS — H04123 Dry eye syndrome of bilateral lacrimal glands: Secondary | ICD-10-CM | POA: Diagnosis not present

## 2017-09-05 DIAGNOSIS — A419 Sepsis, unspecified organism: Secondary | ICD-10-CM | POA: Diagnosis not present

## 2017-09-05 DIAGNOSIS — R627 Adult failure to thrive: Secondary | ICD-10-CM | POA: Diagnosis not present

## 2017-09-06 DIAGNOSIS — R627 Adult failure to thrive: Secondary | ICD-10-CM | POA: Diagnosis not present

## 2017-09-06 DIAGNOSIS — J9601 Acute respiratory failure with hypoxia: Secondary | ICD-10-CM | POA: Diagnosis not present

## 2017-09-06 DIAGNOSIS — R489 Unspecified symbolic dysfunctions: Secondary | ICD-10-CM | POA: Diagnosis not present

## 2017-09-06 DIAGNOSIS — M6281 Muscle weakness (generalized): Secondary | ICD-10-CM | POA: Diagnosis not present

## 2017-09-06 DIAGNOSIS — A419 Sepsis, unspecified organism: Secondary | ICD-10-CM | POA: Diagnosis not present

## 2017-09-06 DIAGNOSIS — E440 Moderate protein-calorie malnutrition: Secondary | ICD-10-CM | POA: Diagnosis not present

## 2017-09-07 DIAGNOSIS — R627 Adult failure to thrive: Secondary | ICD-10-CM | POA: Diagnosis not present

## 2017-09-07 DIAGNOSIS — A419 Sepsis, unspecified organism: Secondary | ICD-10-CM | POA: Diagnosis not present

## 2017-09-07 DIAGNOSIS — J9601 Acute respiratory failure with hypoxia: Secondary | ICD-10-CM | POA: Diagnosis not present

## 2017-09-07 DIAGNOSIS — M6281 Muscle weakness (generalized): Secondary | ICD-10-CM | POA: Diagnosis not present

## 2017-09-07 DIAGNOSIS — R489 Unspecified symbolic dysfunctions: Secondary | ICD-10-CM | POA: Diagnosis not present

## 2017-09-07 DIAGNOSIS — E440 Moderate protein-calorie malnutrition: Secondary | ICD-10-CM | POA: Diagnosis not present

## 2017-09-08 DIAGNOSIS — J9601 Acute respiratory failure with hypoxia: Secondary | ICD-10-CM | POA: Diagnosis not present

## 2017-09-08 DIAGNOSIS — R489 Unspecified symbolic dysfunctions: Secondary | ICD-10-CM | POA: Diagnosis not present

## 2017-09-08 DIAGNOSIS — A419 Sepsis, unspecified organism: Secondary | ICD-10-CM | POA: Diagnosis not present

## 2017-09-08 DIAGNOSIS — E440 Moderate protein-calorie malnutrition: Secondary | ICD-10-CM | POA: Diagnosis not present

## 2017-09-08 DIAGNOSIS — R627 Adult failure to thrive: Secondary | ICD-10-CM | POA: Diagnosis not present

## 2017-09-08 DIAGNOSIS — M6281 Muscle weakness (generalized): Secondary | ICD-10-CM | POA: Diagnosis not present

## 2017-09-11 DIAGNOSIS — E440 Moderate protein-calorie malnutrition: Secondary | ICD-10-CM | POA: Diagnosis not present

## 2017-09-11 DIAGNOSIS — R489 Unspecified symbolic dysfunctions: Secondary | ICD-10-CM | POA: Diagnosis not present

## 2017-09-11 DIAGNOSIS — J9601 Acute respiratory failure with hypoxia: Secondary | ICD-10-CM | POA: Diagnosis not present

## 2017-09-11 DIAGNOSIS — M6281 Muscle weakness (generalized): Secondary | ICD-10-CM | POA: Diagnosis not present

## 2017-09-11 DIAGNOSIS — R627 Adult failure to thrive: Secondary | ICD-10-CM | POA: Diagnosis not present

## 2017-09-11 DIAGNOSIS — A419 Sepsis, unspecified organism: Secondary | ICD-10-CM | POA: Diagnosis not present

## 2017-09-12 DIAGNOSIS — R489 Unspecified symbolic dysfunctions: Secondary | ICD-10-CM | POA: Diagnosis not present

## 2017-09-12 DIAGNOSIS — M6281 Muscle weakness (generalized): Secondary | ICD-10-CM | POA: Diagnosis not present

## 2017-09-12 DIAGNOSIS — E440 Moderate protein-calorie malnutrition: Secondary | ICD-10-CM | POA: Diagnosis not present

## 2017-09-12 DIAGNOSIS — R627 Adult failure to thrive: Secondary | ICD-10-CM | POA: Diagnosis not present

## 2017-09-12 DIAGNOSIS — A419 Sepsis, unspecified organism: Secondary | ICD-10-CM | POA: Diagnosis not present

## 2017-09-12 DIAGNOSIS — J9601 Acute respiratory failure with hypoxia: Secondary | ICD-10-CM | POA: Diagnosis not present

## 2017-09-13 DIAGNOSIS — R627 Adult failure to thrive: Secondary | ICD-10-CM | POA: Diagnosis not present

## 2017-09-13 DIAGNOSIS — R489 Unspecified symbolic dysfunctions: Secondary | ICD-10-CM | POA: Diagnosis not present

## 2017-09-13 DIAGNOSIS — A419 Sepsis, unspecified organism: Secondary | ICD-10-CM | POA: Diagnosis not present

## 2017-09-13 DIAGNOSIS — E440 Moderate protein-calorie malnutrition: Secondary | ICD-10-CM | POA: Diagnosis not present

## 2017-09-13 DIAGNOSIS — M6281 Muscle weakness (generalized): Secondary | ICD-10-CM | POA: Diagnosis not present

## 2017-09-13 DIAGNOSIS — J9601 Acute respiratory failure with hypoxia: Secondary | ICD-10-CM | POA: Diagnosis not present

## 2017-09-14 DIAGNOSIS — M6281 Muscle weakness (generalized): Secondary | ICD-10-CM | POA: Diagnosis not present

## 2017-09-14 DIAGNOSIS — J9601 Acute respiratory failure with hypoxia: Secondary | ICD-10-CM | POA: Diagnosis not present

## 2017-09-14 DIAGNOSIS — A419 Sepsis, unspecified organism: Secondary | ICD-10-CM | POA: Diagnosis not present

## 2017-09-14 DIAGNOSIS — E440 Moderate protein-calorie malnutrition: Secondary | ICD-10-CM | POA: Diagnosis not present

## 2017-09-14 DIAGNOSIS — R627 Adult failure to thrive: Secondary | ICD-10-CM | POA: Diagnosis not present

## 2017-09-14 DIAGNOSIS — R489 Unspecified symbolic dysfunctions: Secondary | ICD-10-CM | POA: Diagnosis not present

## 2017-09-15 DIAGNOSIS — R627 Adult failure to thrive: Secondary | ICD-10-CM | POA: Diagnosis not present

## 2017-09-15 DIAGNOSIS — A419 Sepsis, unspecified organism: Secondary | ICD-10-CM | POA: Diagnosis not present

## 2017-09-15 DIAGNOSIS — J9601 Acute respiratory failure with hypoxia: Secondary | ICD-10-CM | POA: Diagnosis not present

## 2017-09-15 DIAGNOSIS — R489 Unspecified symbolic dysfunctions: Secondary | ICD-10-CM | POA: Diagnosis not present

## 2017-09-15 DIAGNOSIS — M6281 Muscle weakness (generalized): Secondary | ICD-10-CM | POA: Diagnosis not present

## 2017-09-15 DIAGNOSIS — E440 Moderate protein-calorie malnutrition: Secondary | ICD-10-CM | POA: Diagnosis not present

## 2017-09-18 DIAGNOSIS — M6281 Muscle weakness (generalized): Secondary | ICD-10-CM | POA: Diagnosis not present

## 2017-09-18 DIAGNOSIS — R627 Adult failure to thrive: Secondary | ICD-10-CM | POA: Diagnosis not present

## 2017-09-18 DIAGNOSIS — A419 Sepsis, unspecified organism: Secondary | ICD-10-CM | POA: Diagnosis not present

## 2017-09-18 DIAGNOSIS — E440 Moderate protein-calorie malnutrition: Secondary | ICD-10-CM | POA: Diagnosis not present

## 2017-09-18 DIAGNOSIS — R489 Unspecified symbolic dysfunctions: Secondary | ICD-10-CM | POA: Diagnosis not present

## 2017-09-18 DIAGNOSIS — J9601 Acute respiratory failure with hypoxia: Secondary | ICD-10-CM | POA: Diagnosis not present

## 2017-09-19 DIAGNOSIS — R489 Unspecified symbolic dysfunctions: Secondary | ICD-10-CM | POA: Diagnosis not present

## 2017-09-19 DIAGNOSIS — J9601 Acute respiratory failure with hypoxia: Secondary | ICD-10-CM | POA: Diagnosis not present

## 2017-09-19 DIAGNOSIS — M6281 Muscle weakness (generalized): Secondary | ICD-10-CM | POA: Diagnosis not present

## 2017-09-19 DIAGNOSIS — E440 Moderate protein-calorie malnutrition: Secondary | ICD-10-CM | POA: Diagnosis not present

## 2017-09-19 DIAGNOSIS — A419 Sepsis, unspecified organism: Secondary | ICD-10-CM | POA: Diagnosis not present

## 2017-09-19 DIAGNOSIS — R627 Adult failure to thrive: Secondary | ICD-10-CM | POA: Diagnosis not present

## 2017-09-20 DIAGNOSIS — E440 Moderate protein-calorie malnutrition: Secondary | ICD-10-CM | POA: Diagnosis not present

## 2017-09-20 DIAGNOSIS — K5900 Constipation, unspecified: Secondary | ICD-10-CM | POA: Diagnosis not present

## 2017-09-20 DIAGNOSIS — R627 Adult failure to thrive: Secondary | ICD-10-CM | POA: Diagnosis not present

## 2017-09-20 DIAGNOSIS — M6281 Muscle weakness (generalized): Secondary | ICD-10-CM | POA: Diagnosis not present

## 2017-09-20 DIAGNOSIS — A419 Sepsis, unspecified organism: Secondary | ICD-10-CM | POA: Diagnosis not present

## 2017-09-20 DIAGNOSIS — J9601 Acute respiratory failure with hypoxia: Secondary | ICD-10-CM | POA: Diagnosis not present

## 2017-09-20 DIAGNOSIS — R489 Unspecified symbolic dysfunctions: Secondary | ICD-10-CM | POA: Diagnosis not present

## 2017-09-21 DIAGNOSIS — J9601 Acute respiratory failure with hypoxia: Secondary | ICD-10-CM | POA: Diagnosis not present

## 2017-09-21 DIAGNOSIS — E440 Moderate protein-calorie malnutrition: Secondary | ICD-10-CM | POA: Diagnosis not present

## 2017-09-21 DIAGNOSIS — M6281 Muscle weakness (generalized): Secondary | ICD-10-CM | POA: Diagnosis not present

## 2017-09-21 DIAGNOSIS — R489 Unspecified symbolic dysfunctions: Secondary | ICD-10-CM | POA: Diagnosis not present

## 2017-09-21 DIAGNOSIS — A419 Sepsis, unspecified organism: Secondary | ICD-10-CM | POA: Diagnosis not present

## 2017-09-21 DIAGNOSIS — R627 Adult failure to thrive: Secondary | ICD-10-CM | POA: Diagnosis not present

## 2017-09-21 DIAGNOSIS — G3184 Mild cognitive impairment, so stated: Secondary | ICD-10-CM | POA: Diagnosis not present

## 2017-09-22 DIAGNOSIS — A419 Sepsis, unspecified organism: Secondary | ICD-10-CM | POA: Diagnosis not present

## 2017-09-22 DIAGNOSIS — R627 Adult failure to thrive: Secondary | ICD-10-CM | POA: Diagnosis not present

## 2017-09-22 DIAGNOSIS — J9601 Acute respiratory failure with hypoxia: Secondary | ICD-10-CM | POA: Diagnosis not present

## 2017-09-22 DIAGNOSIS — M6281 Muscle weakness (generalized): Secondary | ICD-10-CM | POA: Diagnosis not present

## 2017-09-22 DIAGNOSIS — R489 Unspecified symbolic dysfunctions: Secondary | ICD-10-CM | POA: Diagnosis not present

## 2017-09-22 DIAGNOSIS — E440 Moderate protein-calorie malnutrition: Secondary | ICD-10-CM | POA: Diagnosis not present

## 2017-09-24 DIAGNOSIS — E440 Moderate protein-calorie malnutrition: Secondary | ICD-10-CM | POA: Diagnosis not present

## 2017-09-24 DIAGNOSIS — R489 Unspecified symbolic dysfunctions: Secondary | ICD-10-CM | POA: Diagnosis not present

## 2017-09-24 DIAGNOSIS — J9601 Acute respiratory failure with hypoxia: Secondary | ICD-10-CM | POA: Diagnosis not present

## 2017-09-24 DIAGNOSIS — A419 Sepsis, unspecified organism: Secondary | ICD-10-CM | POA: Diagnosis not present

## 2017-09-24 DIAGNOSIS — R627 Adult failure to thrive: Secondary | ICD-10-CM | POA: Diagnosis not present

## 2017-09-24 DIAGNOSIS — M6281 Muscle weakness (generalized): Secondary | ICD-10-CM | POA: Diagnosis not present

## 2017-09-25 DIAGNOSIS — A419 Sepsis, unspecified organism: Secondary | ICD-10-CM | POA: Diagnosis not present

## 2017-09-25 DIAGNOSIS — M6281 Muscle weakness (generalized): Secondary | ICD-10-CM | POA: Diagnosis not present

## 2017-09-25 DIAGNOSIS — R489 Unspecified symbolic dysfunctions: Secondary | ICD-10-CM | POA: Diagnosis not present

## 2017-09-25 DIAGNOSIS — E440 Moderate protein-calorie malnutrition: Secondary | ICD-10-CM | POA: Diagnosis not present

## 2017-09-25 DIAGNOSIS — J9601 Acute respiratory failure with hypoxia: Secondary | ICD-10-CM | POA: Diagnosis not present

## 2017-09-25 DIAGNOSIS — R627 Adult failure to thrive: Secondary | ICD-10-CM | POA: Diagnosis not present

## 2017-09-26 DIAGNOSIS — J9601 Acute respiratory failure with hypoxia: Secondary | ICD-10-CM | POA: Diagnosis not present

## 2017-09-26 DIAGNOSIS — A419 Sepsis, unspecified organism: Secondary | ICD-10-CM | POA: Diagnosis not present

## 2017-09-26 DIAGNOSIS — E440 Moderate protein-calorie malnutrition: Secondary | ICD-10-CM | POA: Diagnosis not present

## 2017-09-26 DIAGNOSIS — M6281 Muscle weakness (generalized): Secondary | ICD-10-CM | POA: Diagnosis not present

## 2017-09-26 DIAGNOSIS — R627 Adult failure to thrive: Secondary | ICD-10-CM | POA: Diagnosis not present

## 2017-09-26 DIAGNOSIS — R489 Unspecified symbolic dysfunctions: Secondary | ICD-10-CM | POA: Diagnosis not present

## 2017-09-27 ENCOUNTER — Ambulatory Visit (INDEPENDENT_AMBULATORY_CARE_PROVIDER_SITE_OTHER): Payer: Medicare Other | Admitting: *Deleted

## 2017-09-27 DIAGNOSIS — I110 Hypertensive heart disease with heart failure: Secondary | ICD-10-CM

## 2017-09-27 DIAGNOSIS — I482 Chronic atrial fibrillation, unspecified: Secondary | ICD-10-CM

## 2017-09-27 LAB — CUP PACEART INCLINIC DEVICE CHECK
Battery Remaining Longevity: 127 mo
Battery Voltage: 2.97 V
Brady Statistic RV Percent Paced: 19.42 %
Date Time Interrogation Session: 20190828120532
HighPow Impedance: 254 Ohm
HighPow Impedance: 63 Ohm
Implantable Lead Location: 753860
Implantable Lead Model: 6935
Implantable Pulse Generator Implant Date: 20180309
Lead Channel Impedance Value: 437 Ohm
Lead Channel Setting Pacing Amplitude: 2.5 V
Lead Channel Setting Pacing Pulse Width: 0.4 ms
Lead Channel Setting Sensing Sensitivity: 0.3 mV
MDC IDC LEAD IMPLANT DT: 20190131
MDC IDC MSMT LEADCHNL RV IMPEDANCE VALUE: 342 Ohm
MDC IDC MSMT LEADCHNL RV PACING THRESHOLD AMPLITUDE: 0.75 V
MDC IDC MSMT LEADCHNL RV PACING THRESHOLD PULSEWIDTH: 0.4 ms
MDC IDC MSMT LEADCHNL RV SENSING INTR AMPL: 20.125 mV

## 2017-09-27 NOTE — Progress Notes (Signed)
ICD check in clinic. Normal device function. Threshold and sensing consistent with previous device measurements. Impedance trends stable over time. No evidence of any ventricular arrhythmias. Permanent AF, No OAC due to history of SDH. Histogram distribution appropriate for patient and level of activity. No changes made this session. Device programmed at appropriate safety margins. Device programmed to optimize intrinsic conduction. Optivol elevated since June 23rd, patient states no symptoms, will review with Dr. Lovena Le. Estimated longevity 10.6 years. Patient education completed including shock plan. ROV with [facility] 01/01/18, ROV with [facility] 06/2018.

## 2017-09-28 ENCOUNTER — Non-Acute Institutional Stay (SKILLED_NURSING_FACILITY): Payer: Medicare Other | Admitting: Adult Health

## 2017-09-28 ENCOUNTER — Encounter: Payer: Self-pay | Admitting: Adult Health

## 2017-09-28 DIAGNOSIS — N183 Chronic kidney disease, stage 3 unspecified: Secondary | ICD-10-CM

## 2017-09-28 DIAGNOSIS — K219 Gastro-esophageal reflux disease without esophagitis: Secondary | ICD-10-CM

## 2017-09-28 DIAGNOSIS — R627 Adult failure to thrive: Secondary | ICD-10-CM | POA: Diagnosis not present

## 2017-09-28 DIAGNOSIS — K5909 Other constipation: Secondary | ICD-10-CM

## 2017-09-28 DIAGNOSIS — R1314 Dysphagia, pharyngoesophageal phase: Secondary | ICD-10-CM

## 2017-09-28 DIAGNOSIS — E440 Moderate protein-calorie malnutrition: Secondary | ICD-10-CM | POA: Diagnosis not present

## 2017-09-28 DIAGNOSIS — J9601 Acute respiratory failure with hypoxia: Secondary | ICD-10-CM | POA: Diagnosis not present

## 2017-09-28 DIAGNOSIS — R489 Unspecified symbolic dysfunctions: Secondary | ICD-10-CM | POA: Diagnosis not present

## 2017-09-28 DIAGNOSIS — A419 Sepsis, unspecified organism: Secondary | ICD-10-CM | POA: Diagnosis not present

## 2017-09-28 DIAGNOSIS — M6281 Muscle weakness (generalized): Secondary | ICD-10-CM | POA: Diagnosis not present

## 2017-09-28 NOTE — Progress Notes (Signed)
Location:   [facility] Room Number: 205 A Place of Service:  SNF (31)   CODE STATUS: Full Code  Allergies  Allergen Reactions  . Tamsulosin Other (See Comments)    Dizziness, Made BP very low and weakness  . Celebrex [Celecoxib] Hives and Nausea And Vomiting    Gi upset  . Dronedarone Nausea And Vomiting and Other (See Comments)    GI upset, abdominal pain  . Esomeprazole Magnesium Hives and Other (See Comments)    "don't really remember"  . Digoxin Diarrhea    May have caused some diarrhea  . Hydrocodone-Acetaminophen Other (See Comments)    Bad headache  . Protonix [Pantoprazole Sodium] Nausea And Vomiting and Other (See Comments)    Tolerates Dexilant    Chief Complaint  Patient presents with  . Medical Management of Chronic Issues    Dysphagia; gerd; constipation; ckd stage 3.     HPI:  He is a 84 year old long term resident of this facility being seen for the management of his chronic illnesses: dysphagia; gerd; constipation; ckd. He denies any abdominal pain; no complaints of nausea; no constipation. There are no nursing concerns at this time.   Past Medical History:  Diagnosis Date  . Abnormal thyroid scan    Abnormal thyroid imaging studies from 11/09/2010, status post ultrasound guided fine needle aspiration of the dominant left inferior thyroid nodule on 12/15/2010. Cytology report showed rare follicular epithelial cells and hemosiderin laden macrophages.  . Adenomatous colon polyp   . AICD (automatic cardioverter/defibrillator) present    a. fx lead; a. s/p lead extraction 03/02/17  . Arthritis    "all over"  . Asthma   . Atrial fibrillation ([place])    on chronic Coumadin; stopped July 2013 due to subdural hematomas  . CHF (congestive heart failure) (HCC)    EF 35-40% s/p most recent ICD generator change-out with Medtronic dual-chamber ICD 05/20/11 with explantation of previous abdominally-implanted device  . Coronary artery disease    s/p  CABG 1983 and PCI/stent 2004.   . Diabetes mellitus    diet controlled  . Diverticulosis   . Dyslipidemia   . Enteritis   . Erythrocytosis   . GERD (gastroesophageal reflux disease)   . Hepatic steatosis   . Hypertension   . Ischemic cardiomyopathy    WITH CHF  . Monocytosis 04/17/2013  . Myocardial infarction ([place]) 1983; ~ 1990  . Pleural effusion    right  . Pneumonia August 2013  . Portal hypertensive gastropathy ([place])   . Rectal ulcer   . Renal calculi   . Subdural hematoma [facility]) July 2013   Anticoagulation stopped.   . SunDown syndrome   . VT (ventricular tachycardia) ([place])     Past Surgical History:  Procedure Laterality Date  . CHOLECYSTECTOMY    . COLONOSCOPY  07/07/2011   Procedure: COLONOSCOPY;  Surgeon: Jerene Bears, MD;  Location: [facility];  Service: Gastroenterology;  Laterality: N/A;  . CORONARY ANGIOPLASTY WITH STENT PLACEMENT  2004   Tandem Cypher stents LAD  . CORONARY ARTERY BYPASS GRAFT  1983   SVG-mLAD  . ESOPHAGOGASTRODUODENOSCOPY  02/11/2011   Procedure: ESOPHAGOGASTRODUODENOSCOPY (EGD);  Surgeon: Beryle Beams, MD;  Location: [facility];  Service: Endoscopy;  Laterality: N/A;  . FLEXIBLE SIGMOIDOSCOPY N/A 02/08/2017   Procedure: FLEXIBLE SIGMOIDOSCOPY;  Surgeon: Irene Shipper, MD;  Location: [facility];  Service: Endoscopy;  Laterality: N/A;  . ICD GENERATOR CHANGEOUT N/A 04/08/2016   Procedure: ICD Generator Changeout;  Surgeon: Evans Lance, MD;  Location: [place] [facility];  Service: Cardiovascular;  Laterality: N/A;  . ICD LEAD REMOVAL N/A 03/02/2017   Procedure: ICD LEAD REMOVAL;  Surgeon: Evans Lance, MD;  Location: [place];  Service: Cardiovascular;  Laterality: N/A;  . IMPLANTABLE CARDIOVERTER DEFIBRILLATOR (ICD) GENERATOR CHANGE N/A 05/20/2011   Procedure: ICD GENERATOR CHANGE;  Surgeon: Evans Lance, MD;  Medtronic secure dual-chamber ICD serial number [id number]   . IR GASTROSTOMY TUBE MOD SED  04/19/2017  . KNEE ARTHROSCOPY      right; "just went in and scraped it"  . LEAD INSERTION N/A 03/02/2017   Procedure: LEAD INSERTION;  Surgeon: Evans Lance, MD;  Location: [place];  Service: Cardiovascular;  Laterality: N/A;  . MASS EXCISION Right 05/10/2013   Procedure: EXCISION MASS RIGHT THUMB;  Surgeon: Wynonia Sours, MD;  Location: [place];  Service: Orthopedics;  Laterality: Right;  . PROXIMAL INTERPHALANGEAL FUSION (PIP) Right 05/10/2013   Procedure: DEBRIDEMENT PROXIMAL INTERPHALANGEAL FUSION (PIP);  Surgeon: Wynonia Sours, MD;  Location: [place];  Service: Orthopedics;  Laterality: Right;  . TONSILLECTOMY          Social History   Socioeconomic History  . Marital status: Divorced    Spouse name: Not on file  . Number of children: 2  . Years of education: Not on file  . Highest education level: Not on file  Occupational History  . Occupation: Sports coach: RETIRED  Social Needs  . Financial resource strain: Not on file  . Food insecurity:    Worry: Not on file    Inability: Not on file  . Transportation needs:    Medical: Not on file    Non-medical: Not on file  Tobacco Use  . Smoking status: Former Smoker    Packs/day: 2.00    Years: 30.00    Pack years: 60.00    Types: Cigarettes    Last attempt to quit: 06/23/1976    Years since quitting: 41.2  . Smokeless tobacco: Never Used  Substance and Sexual Activity  . Alcohol use: No  . Drug use: No  . Sexual activity: Never  Lifestyle  . Physical activity:    Days per week: Not on file    Minutes per session: Not on file  . Stress: Not on file  Relationships  . Social connections:    Talks on phone: Not on file    Gets together: Not on file    Attends religious service: Not on file    Active member of club or organization: Not on file    Attends meetings of clubs or organizations: Not on file    Relationship status: Not on file  . Intimate partner violence:    Fear of current or ex partner: Not on  file    Emotionally abused: Not on file    Physically abused: Not on file    Forced sexual activity: Not on file  Other Topics Concern  . Not on file  Social History Narrative  . Not on file   Family History  Problem Relation Age of Onset  . Tuberculosis Mother   . Tuberculosis Father   . Heart disease Brother   . Diabetes Sister   . Diabetes Brother   . Clotting disorder Brother       VITAL SIGNS BP 128/82   Pulse 88   Temp 97.8 F (36.6 C)   Resp 16  Ht 6' 1" (1.854 m)   Wt 152 lb 6.4 oz (69.1 kg)   SpO2 98%   BMI 20.11 kg/m   Outpatient Encounter Medications as of 09/28/2017  Medication Sig  . acetaminophen (TYLENOL) 325 MG tablet Place 650 mg into feeding tube every 6 (six) hours as needed.  Marland Kitchen ADVAIR DISKUS 250-50 MCG/DOSE AEPB Inhale 1 puff into the lungs 2 (two) times daily.   . Artificial Saliva (BIOTENE MOISTURIZING MOUTH) SOLN Give 1 swab by mouth every 4 hours while awake  . digoxin (LANOXIN) 0.05 MG/ML solution Place 2.5 mLs (0.125 mg total) into feeding tube daily.  . furosemide (LASIX) 10 MG/ML solution Give 2 ml (63m) via PEG - Tube daily  . ipratropium (ATROVENT) 0.03 % nasal spray Place 1 spray into both nostrils 3 (three) times daily before meals.  .Marland Kitchenipratropium-albuterol (DUONEB) 0.5-2.5 (3) MG/3ML SOLN Take 3 mLs by nebulization every 4 (four) hours as needed.  . Lisinopril 1 MG/ML SOLN Place 2.5 mg into feeding tube daily.  . metoprolol tartrate (LOPRESSOR) 25 mg/10 mL SUSP Place 10 mLs (25 mg total) into feeding tube 2 (two) times daily.  . nitroGLYCERIN (NITROSTAT) 0.4 MG SL tablet Place 1 tablet (0.4 mg total) under the tongue every 5 (five) minutes as needed for chest pain.  . Nutritional Supplements (FEEDING SUPPLEMENT, JEVITY 1.5 CAL,) LIQD Jevity 1.5 -  Give 240cc  Six times daily  . Nutritional Supplements (NUTRITIONAL SUPPLEMENT PO) NPO - nothing by mouth diet, Nothing by mouth texture HSG Puree diet - HSG Puree texture, thickened Fluids  consistency  . omeprazole (PRILOSEC) 20 MG capsule Give 1 capsule enterally daily  . ondansetron (ZOFRAN) 4 MG tablet Place 4 mg into feeding tube every 6 (six) hours as needed for nausea or vomiting.  . polyethylene glycol (MIRALAX / GLYCOLAX) packet Place 17 g into feeding tube daily.  . potassium chloride 20 MEQ/15ML (10%) SOLN Place 7.5 mLs (10 mEq total) into feeding tube daily.  . RESTASIS 0.05 % ophthalmic emulsion INSTILL 1 DROP INTO BOTH EYES TWICE A DAY  . tiotropium (SPIRIVA) 18 MCG inhalation capsule Place 18 mcg into inhaler and inhale daily.  . traMADol (ULTRAM) 50 MG tablet Give 1 tablet by mouth for moderate pain or 2 tablets by mouth for severe pain every 6 hours as needed  . Water For Irrigation, Sterile (FREE WATER) SOLN Place into feeding tube every 4 (four) hours. 120 ml H2o flushes via tube every 4 hours  . [DISCONTINUED] acetaminophen (TYLENOL) 650 MG suppository Place 1 suppository (650 mg total) rectally every 6 (six) hours as needed for fever or mild pain (T over 101). (Patient not taking: Reported on 09/28/2017)   No facility-administered encounter medications on file as of 09/28/2017.      SIGNIFICANT DIAGNOSTIC EXAMS  PREVIOUS:   03-26-17: ct of chest abdomen and pelvis: 1. No acute/traumatic intrathoracic, abdominal, or pelvic pathology. 2. Nonocclusive pulmonary embolus in the subsegmental right lower lobe pulmonary artery branches likely old PE or scarring. Nonocclusive acute PE is not entirely excluded. Clinical correlation is recommended. 3. Mild cardiomegaly with dilatation of the right atrium. 4. 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 Small right pleural effusion and stable right lung base round atelectasis/scarring. 5. Aortic Atherosclerosis  and Emphysema    04-07-17: chest x-ray: Suspected patchy infiltrate at the right base, short interval radiographic follow-up suggested to ensure clearing. Cardiomegaly.  04-09-17: chest x-ray:  COPD, cardiomegaly, stable. Increasing right basilar airspace opacity and small  right effusion concerning for pneumonia  04-10-17: swallow study: Recommend NPO with short term alternative nutrition and continued ST   04-19-17: PEG tube insertion   05-01-17: chest x-ray: mild right basilar infiltrate with trace right sided effusion  05-23-17: chest x-ray: COPD changes with mild atelectasis and small pleural effusion versus pleural thickening at RIGHT base. Improved RIGHT basilar infiltrates since previous exam.  06-19-17: swallow study: Recommend pt continue po and dysphagia treatment with SLP. Once compensation strategies are generalized, suspect pt may tolerate comfort po intake independently. As his strong "hock" is protective of his airway, suspect care plan may be able to be more liberal. Anticipate however that pt will continue to require PEG tube for nutrition.    NO NEW EXAMS.     LABS REVIEWED: PREVIOUS:   04-07-17: wbc 9.5; hgb 13.4; hct 41.9; mcv 94.2; plt 101; glucose 84; bun 18; creat 1.28 ;k+ 4.0; na++ 136; ca 8.2; total bili 2.5; albumin 3.2; rapid flu: +A; blood culture: no growth; urine culture: <10,000 colonies 04-09-17: wbc 9.0; hgb 11.5; hct 35.1; mcv 91.9; plt 120; glucose 88; bun 9; creat 0.86; k+ 3.5; na++ 133; ca 7.5; mag 1.7 04-13-17: wbc 9.4; hgb 13.8; hct 42.6; mcv 94.0; plt 217; glucose 115; bun 11; creat 1.04 ;k+ 3.3; na++144; ca 8.7 04-16-17: wbc 6.5; hgb 12.2; hct 38.5; mcv 95.3; plt 185; glucose 115; bun 11; creat 0.78; k+ 4.0; na++ 143; ca 8.9; mag 1.7  04-20-17:wbc 12.4; hgb 12.2; hct 38.0; mcv 93.6; plt 209; glucose 110; bun 18; creat 0.95; k+ 4.7; na++ 136; ca 8.4; mag 2.7;  04-22-17: mag 1.9 05-02-17: wbc 7.1; hgb 11.8; hct 34.0; mcv 87.6; plt 223; glucose 130; bun 22.2; creat 0.71; k+ 4.4; na++ 143; ca 9.1; alk phos 123; albumin 3.4; tsh 0.91  06-16-17: wbc 8.7; hgb 13.9; hct 42.2; mcv 91.3; plt 179 07-11-17: H pylori: IgG AB: 0.43; IgA AB: 20.1 (hi): IgM abs:  <9.0  NO NEW LABS.   Review of Systems  Constitutional: Negative for malaise/fatigue.  Respiratory: Negative for cough and shortness of breath.   Cardiovascular: Negative for chest pain, palpitations and leg swelling.  Gastrointestinal: Negative for abdominal pain, constipation and heartburn.  Musculoskeletal: Negative for back pain, joint pain and myalgias.  Skin: Negative.   Neurological: Negative for dizziness.  Psychiatric/Behavioral: The patient is not nervous/anxious.     Physical Exam  Constitutional: He is oriented to person, place, and time. He appears well-developed and well-nourished. No distress.  Neck: No thyromegaly present.  Cardiovascular: Normal rate, regular rhythm and intact distal pulses.  Murmur heard. 1/6  Pulmonary/Chest: Effort normal and breath sounds normal. No respiratory distress.  Abdominal: Soft. Bowel sounds are normal. He exhibits no distension. There is no tenderness.  Peg tube present without signs of infection present   Musculoskeletal: Normal range of motion. He exhibits no edema.  Uses walker   Lymphadenopathy:    He has no cervical adenopathy.  Neurological: He is alert and oriented to person, place, and time.  Skin: Skin is warm and dry. He is not diaphoretic.  Psychiatric: He has a normal mood and affect.     ASSESSMENT/ PLAN:  TODAY;  1. Gastroesophageal reflux disease without esophagitis: stable will continue prilosec 20 mg daily    2. Chronic constipation: stable will continue miralax daily   3.  Pharyngoesophageal dysphagia: has chronic aspiration: is currently NPO and is receiving jevity tube feeding. For his dry mouth will continue biotene every 4 hours while awake and  will monitor his status.  Will continue atrovent nasal spray prior to meals   4. CKD stage III: stable bun 22.2/creat 0.71  PREVIOUS    5. Polyneuropathy : is stable will monitor   6.  Adult failure to thrive: weight is 152 pounds; albumin is 3.4; without  change in status; will continue to monitor  7. History of right lung PE; without change he is not a candidate for anticoagulation therapy due to history of falls and subdural hematoma.     8. Chronic systolic heart failure: EF 35-40%: is status post AICD placement:  is stable will continue lasix 20 mg daily with k+ 10 meq daily  lopressor 25 mg twice daily and will monitor his status.   9. Atrial fibrillation with controlled ventricular response: has history of PE  heart rate stable: is not a candidate for anticoagulations due to history of subdural hematoma: will continue lopressor 25 mg twice daily and digoxin 0.125 mg daily for rate control   10. Coronary artery disease involving native coronary artery with angina pectoris: is status post CABG/ stent placements: is stable will continue lopressor 25 mg twice daily and has prn ntg.   11. Hypertensive heart disease with heart failure: stable b/p 128/82 will continue lopressor 25 mg twice daily and lisinopril 2.5 mg daily  12. COPD( emphysema): is stable: is now on 02 at 2L/[place] as needed to keep sats >91 %. Will continue advair 250/50 twice daily  spiriva 18 mcg daily duoneb every 6 hours as needed   13. Chronic allergic rhinitis: stable will continue atrovent nasal spray three times daily will monitor         MD is aware of resident's narcotic use and is in agreement with current plan of care. We will attempt to wean resident as apropriate   Ok Edwards NP [facility]  Contact [phone] Monday through Friday 8am- 5pm  After hours call [phone]

## 2017-09-29 ENCOUNTER — Other Ambulatory Visit: Payer: Self-pay

## 2017-09-29 ENCOUNTER — Inpatient Hospital Stay (HOSPITAL_COMMUNITY)
Admission: EM | Admit: 2017-09-29 | Discharge: 2017-10-06 | DRG: 335 | Disposition: A | Discharge status: Skilled Nursing Facility | Payer: Medicare Other | Attending: General Surgery | Admitting: General Surgery

## 2017-09-29 ENCOUNTER — Encounter (HOSPITAL_COMMUNITY): Payer: Self-pay | Admitting: Emergency Medicine

## 2017-09-29 DIAGNOSIS — K56609 Unspecified intestinal obstruction, unspecified as to partial versus complete obstruction: Secondary | ICD-10-CM | POA: Diagnosis not present

## 2017-09-29 DIAGNOSIS — R402253 Coma scale, best verbal response, oriented, at hospital admission: Secondary | ICD-10-CM | POA: Diagnosis present

## 2017-09-29 DIAGNOSIS — E43 Unspecified severe protein-calorie malnutrition: Secondary | ICD-10-CM

## 2017-09-29 DIAGNOSIS — R0902 Hypoxemia: Secondary | ICD-10-CM | POA: Diagnosis not present

## 2017-09-29 DIAGNOSIS — K565 Intestinal adhesions [bands], unspecified as to partial versus complete obstruction: Secondary | ICD-10-CM | POA: Diagnosis not present

## 2017-09-29 DIAGNOSIS — Z9049 Acquired absence of other specified parts of digestive tract: Secondary | ICD-10-CM

## 2017-09-29 DIAGNOSIS — K766 Portal hypertension: Secondary | ICD-10-CM | POA: Diagnosis present

## 2017-09-29 DIAGNOSIS — Z79899 Other long term (current) drug therapy: Secondary | ICD-10-CM | POA: Diagnosis not present

## 2017-09-29 DIAGNOSIS — R1084 Generalized abdominal pain: Secondary | ICD-10-CM

## 2017-09-29 DIAGNOSIS — R188 Other ascites: Secondary | ICD-10-CM | POA: Diagnosis not present

## 2017-09-29 DIAGNOSIS — J690 Pneumonitis due to inhalation of food and vomit: Secondary | ICD-10-CM | POA: Diagnosis present

## 2017-09-29 DIAGNOSIS — Z955 Presence of coronary angioplasty implant and graft: Secondary | ICD-10-CM

## 2017-09-29 DIAGNOSIS — Z885 Allergy status to narcotic agent status: Secondary | ICD-10-CM

## 2017-09-29 DIAGNOSIS — I252 Old myocardial infarction: Secondary | ICD-10-CM

## 2017-09-29 DIAGNOSIS — N183 Chronic kidney disease, stage 3 (moderate): Secondary | ICD-10-CM | POA: Diagnosis present

## 2017-09-29 DIAGNOSIS — E1142 Type 2 diabetes mellitus with diabetic polyneuropathy: Secondary | ICD-10-CM | POA: Diagnosis present

## 2017-09-29 DIAGNOSIS — K567 Ileus, unspecified: Secondary | ICD-10-CM | POA: Diagnosis not present

## 2017-09-29 DIAGNOSIS — K559 Vascular disorder of intestine, unspecified: Secondary | ICD-10-CM | POA: Diagnosis present

## 2017-09-29 DIAGNOSIS — K9189 Other postprocedural complications and disorders of digestive system: Secondary | ICD-10-CM | POA: Diagnosis not present

## 2017-09-29 DIAGNOSIS — I255 Ischemic cardiomyopathy: Secondary | ICD-10-CM | POA: Diagnosis present

## 2017-09-29 DIAGNOSIS — Z4659 Encounter for fitting and adjustment of other gastrointestinal appliance and device: Secondary | ICD-10-CM

## 2017-09-29 DIAGNOSIS — K6389 Other specified diseases of intestine: Secondary | ICD-10-CM | POA: Diagnosis present

## 2017-09-29 DIAGNOSIS — R1312 Dysphagia, oropharyngeal phase: Secondary | ICD-10-CM | POA: Diagnosis present

## 2017-09-29 DIAGNOSIS — Z87891 Personal history of nicotine dependence: Secondary | ICD-10-CM

## 2017-09-29 DIAGNOSIS — I2510 Atherosclerotic heart disease of native coronary artery without angina pectoris: Secondary | ICD-10-CM | POA: Diagnosis present

## 2017-09-29 DIAGNOSIS — R402143 Coma scale, eyes open, spontaneous, at hospital admission: Secondary | ICD-10-CM | POA: Diagnosis present

## 2017-09-29 DIAGNOSIS — Z888 Allergy status to other drugs, medicaments and biological substances status: Secondary | ICD-10-CM

## 2017-09-29 DIAGNOSIS — I5022 Chronic systolic (congestive) heart failure: Secondary | ICD-10-CM | POA: Diagnosis present

## 2017-09-29 DIAGNOSIS — R140 Abdominal distension (gaseous): Secondary | ICD-10-CM | POA: Diagnosis not present

## 2017-09-29 DIAGNOSIS — K219 Gastro-esophageal reflux disease without esophagitis: Secondary | ICD-10-CM | POA: Diagnosis present

## 2017-09-29 DIAGNOSIS — R4702 Dysphasia: Secondary | ICD-10-CM | POA: Diagnosis present

## 2017-09-29 DIAGNOSIS — I482 Chronic atrial fibrillation: Secondary | ICD-10-CM | POA: Diagnosis present

## 2017-09-29 DIAGNOSIS — Z8679 Personal history of other diseases of the circulatory system: Secondary | ICD-10-CM

## 2017-09-29 DIAGNOSIS — E1122 Type 2 diabetes mellitus with diabetic chronic kidney disease: Secondary | ICD-10-CM | POA: Diagnosis present

## 2017-09-29 DIAGNOSIS — R0602 Shortness of breath: Secondary | ICD-10-CM

## 2017-09-29 DIAGNOSIS — K920 Hematemesis: Secondary | ICD-10-CM | POA: Diagnosis not present

## 2017-09-29 DIAGNOSIS — G931 Anoxic brain damage, not elsewhere classified: Secondary | ICD-10-CM

## 2017-09-29 DIAGNOSIS — K279 Peptic ulcer, site unspecified, unspecified as acute or chronic, without hemorrhage or perforation: Secondary | ICD-10-CM | POA: Diagnosis present

## 2017-09-29 DIAGNOSIS — Z951 Presence of aortocoronary bypass graft: Secondary | ICD-10-CM

## 2017-09-29 DIAGNOSIS — D631 Anemia in chronic kidney disease: Secondary | ICD-10-CM | POA: Diagnosis present

## 2017-09-29 DIAGNOSIS — Z95810 Presence of automatic (implantable) cardiac defibrillator: Secondary | ICD-10-CM

## 2017-09-29 DIAGNOSIS — I4891 Unspecified atrial fibrillation: Secondary | ICD-10-CM | POA: Diagnosis not present

## 2017-09-29 DIAGNOSIS — J45909 Unspecified asthma, uncomplicated: Secondary | ICD-10-CM | POA: Diagnosis present

## 2017-09-29 DIAGNOSIS — I130 Hypertensive heart and chronic kidney disease with heart failure and stage 1 through stage 4 chronic kidney disease, or unspecified chronic kidney disease: Secondary | ICD-10-CM | POA: Diagnosis not present

## 2017-09-29 DIAGNOSIS — R110 Nausea: Secondary | ICD-10-CM | POA: Diagnosis not present

## 2017-09-29 DIAGNOSIS — Z7951 Long term (current) use of inhaled steroids: Secondary | ICD-10-CM

## 2017-09-29 DIAGNOSIS — Z931 Gastrostomy status: Secondary | ICD-10-CM

## 2017-09-29 DIAGNOSIS — J918 Pleural effusion in other conditions classified elsewhere: Secondary | ICD-10-CM | POA: Diagnosis present

## 2017-09-29 DIAGNOSIS — Z886 Allergy status to analgesic agent status: Secondary | ICD-10-CM

## 2017-09-29 DIAGNOSIS — D649 Anemia, unspecified: Secondary | ICD-10-CM | POA: Diagnosis not present

## 2017-09-29 DIAGNOSIS — R1110 Vomiting, unspecified: Secondary | ICD-10-CM | POA: Diagnosis not present

## 2017-09-29 DIAGNOSIS — K760 Fatty (change of) liver, not elsewhere classified: Secondary | ICD-10-CM | POA: Diagnosis present

## 2017-09-29 DIAGNOSIS — G709 Myoneural disorder, unspecified: Secondary | ICD-10-CM | POA: Diagnosis present

## 2017-09-29 DIAGNOSIS — Z8719 Personal history of other diseases of the digestive system: Secondary | ICD-10-CM

## 2017-09-29 DIAGNOSIS — E876 Hypokalemia: Secondary | ICD-10-CM | POA: Diagnosis not present

## 2017-09-29 DIAGNOSIS — Z86010 Personal history of colonic polyps: Secondary | ICD-10-CM

## 2017-09-29 DIAGNOSIS — R402363 Coma scale, best motor response, obeys commands, at hospital admission: Secondary | ICD-10-CM | POA: Diagnosis present

## 2017-09-29 LAB — COMPREHENSIVE METABOLIC PANEL
ALBUMIN: 4 g/dL (ref 3.5–5.0)
ALT: 9 U/L (ref 0–44)
AST: 20 U/L (ref 15–41)
Alkaline Phosphatase: 76 U/L (ref 38–126)
Anion gap: 10 (ref 5–15)
BUN: 29 mg/dL — AB (ref 8–23)
CHLORIDE: 98 mmol/L (ref 98–111)
CO2: 31 mmol/L (ref 22–32)
CREATININE: 1.14 mg/dL (ref 0.61–1.24)
Calcium: 9.5 mg/dL (ref 8.9–10.3)
GFR calc non Af Amer: 58 mL/min — ABNORMAL LOW (ref >60)
Glucose, Bld: 139 mg/dL — ABNORMAL HIGH (ref 70–99)
POTASSIUM: 4.4 mmol/L (ref 3.5–5.1)
Sodium: 139 mmol/L (ref 135–145)
Total Bilirubin: 1.5 mg/dL — ABNORMAL HIGH (ref 0.3–1.2)
Total Protein: 7.4 g/dL (ref 6.5–8.1)

## 2017-09-29 LAB — CBC
HEMATOCRIT: 46.8 % (ref 39.0–52.0)
Hemoglobin: 14.4 g/dL (ref 13.0–17.0)
MCH: 28.9 pg (ref 26.0–34.0)
MCHC: 30.8 g/dL (ref 30.0–36.0)
MCV: 93.8 fL (ref 78.0–100.0)
PLATELETS: 158 10*3/uL (ref 150–400)
RBC: 4.99 MIL/uL (ref 4.22–5.81)
RDW: 14.6 % (ref 11.5–15.5)
WBC: 13.9 10*3/uL — AB (ref 4.0–10.5)

## 2017-09-29 LAB — POC OCCULT BLOOD, ED: Fecal Occult Bld: POSITIVE — AB

## 2017-09-29 LAB — TYPE AND SCREEN
ABO/RH(D): O POS
Antibody Screen: NEGATIVE

## 2017-09-29 LAB — LIPASE, BLOOD: Lipase: 26 U/L (ref 11–51)

## 2017-09-29 MED ORDER — IOPAMIDOL (ISOVUE-300) INJECTION 61%
INTRAVENOUS | Status: AC
  Filled 2017-09-29: qty 100

## 2017-09-29 MED ORDER — ONDANSETRON HCL 4 MG/2ML IJ SOLN
4.0000 mg | Freq: Once | INTRAMUSCULAR | Status: AC
  Administered 2017-09-29: 4 mg via INTRAVENOUS
  Filled 2017-09-29: qty 2

## 2017-09-29 NOTE — ED Notes (Signed)
After standing pt got onto bedside commode and began to vomit. RN notifed. Heart rate also irregular at this time.

## 2017-09-29 NOTE — ED Provider Notes (Signed)
[facility] EMERGENCY DEPARTMENT Provider Note   CSN: [id number] Arrival date & time: 09/29/17  2209     History   Chief Complaint Chief Complaint  Patient presents with  . Hematemesis  . Abdominal Pain    HPI [name] is a 84 y.o. male.  HPI 84 year old Caucasian male past medical history significant for A. fib not currently anticoagulated, CHF, CAD, diabetes, oropharyngeal dysphasia with PEG tube in place presents to the emergency department today from nursing facility for evaluation of hematemesis along with generalized abdominal pain.  Stated patient had one episode of coffee-ground emesis earlier today.  Patient reports abdominal pain.  States this is generalized.  Denies any bloody stools.  Denies any fevers or chills.  The facility reports that patient has been lethargic throughout the day.  Patient denies any chest pain or shortness of breath.  Denies any lightheadedness or dizziness.  Patient has not taken anything for her symptoms prior to arrival.  Pt denies any fever, chill, ha, vision changes, lightheadedness, dizziness, congestion, neck pain, cp, sob, cough,  urinary symptoms, change in bowel habits, melena, hematochezia, lower extremity paresthesias.  Past Medical History:  Diagnosis Date  . Abnormal thyroid scan    Abnormal thyroid imaging studies from 11/09/2010, status post ultrasound guided fine needle aspiration of the dominant left inferior thyroid nodule on 12/15/2010. Cytology report showed rare follicular epithelial cells and hemosiderin laden macrophages.  . Adenomatous colon polyp   . AICD (automatic cardioverter/defibrillator) present    a. fx lead; a. s/p lead extraction 03/02/17  . Arthritis    "all over"  . Asthma   . Atrial fibrillation ([place])    on chronic Coumadin; stopped July 2013 due to subdural hematomas  . CHF (congestive heart failure) (HCC)    EF 35-40% s/p most recent ICD generator change-out with Medtronic  dual-chamber ICD 05/20/11 with explantation of previous abdominally-implanted device  . Coronary artery disease    s/p CABG 1983 and PCI/stent 2004.   . Diabetes mellitus    diet controlled  . Diverticulosis   . Dyslipidemia   . Enteritis   . Erythrocytosis   . GERD (gastroesophageal reflux disease)   . Hepatic steatosis   . Hypertension   . Ischemic cardiomyopathy    WITH CHF  . Monocytosis 04/17/2013  . Myocardial infarction ([place]) 1983; ~ 1990  . Pleural effusion    right  . Pneumonia August 2013  . Portal hypertensive gastropathy ([place])   . Rectal ulcer   . Renal calculi   . Subdural hematoma [facility]) July 2013   Anticoagulation stopped.   . SunDown syndrome   . VT (ventricular tachycardia) [facility])     Patient Active Problem List   Diagnosis Date Noted  . Chronic pulmonary aspiration 07/31/2017  . H. pylori infection 07/24/2017  . Oral thrush 06/03/2017  . Chronic allergic rhinitis 05/14/2017  . Polyneuropathy 05/14/2017  . COPD (chronic obstructive pulmonary disease) ([place]) 05/02/2017  . Hypertensive heart disease with heart failure ([place]) 04/27/2017  . Adult failure to thrive   . DNR (do not resuscitate)   . Palliative care by specialist   . Malnutrition of moderate degree 04/13/2017  . CKD (chronic kidney disease), stage III ([place]) 04/07/2017  . Thyroid nodule 04/01/2017  . Chronic pulmonary embolism ([place]) 03/27/2017  . Failure of implantable cardioverter-defibrillator (ICD) lead 03/02/2017  . Chronic constipation 07/13/2016  . ICD (implantable cardioverter-defibrillator) in place 04/08/2016  . Pharyngoesophageal dysphagia 12/31/2013  . Gastroesophageal reflux disease without  esophagitis 12/31/2013  . Monocytosis 04/17/2013  . Pleural effusion, right 10/03/2011  . Anemia, iron deficiency 12/29/2010  . Coronary artery disease involving native coronary artery of native heart with angina pectoris ([place]) 06/25/2010  . Chronic atrial fibrillation ([place]) 12/30/2009  . Chronic  systolic heart failure- EF 35-40% 12/30/2009  . Automatic implantable cardioverter-defibrillator in situ 12/30/2009    Past Surgical History:  Procedure Laterality Date  . CHOLECYSTECTOMY    . COLONOSCOPY  07/07/2011   Procedure: COLONOSCOPY;  Surgeon: Jerene Bears, MD;  Location: [facility];  Service: Gastroenterology;  Laterality: N/A;  . CORONARY ANGIOPLASTY WITH STENT PLACEMENT  2004   Tandem Cypher stents LAD  . CORONARY ARTERY BYPASS GRAFT  1983   SVG-mLAD  . ESOPHAGOGASTRODUODENOSCOPY  02/11/2011   Procedure: ESOPHAGOGASTRODUODENOSCOPY (EGD);  Surgeon: Beryle Beams, MD;  Location: [facility];  Service: Endoscopy;  Laterality: N/A;  . FLEXIBLE SIGMOIDOSCOPY N/A 02/08/2017   Procedure: FLEXIBLE SIGMOIDOSCOPY;  Surgeon: Irene Shipper, MD;  Location: [facility];  Service: Endoscopy;  Laterality: N/A;  . ICD GENERATOR CHANGEOUT N/A 04/08/2016   Procedure: ICD Generator Changeout;  Surgeon: Evans Lance, MD;  Location: [facility];  Service: Cardiovascular;  Laterality: N/A;  . ICD LEAD REMOVAL N/A 03/02/2017   Procedure: ICD LEAD REMOVAL;  Surgeon: Evans Lance, MD;  Location: [place] [facility];  Service: Cardiovascular;  Laterality: N/A;  . IMPLANTABLE CARDIOVERTER DEFIBRILLATOR (ICD) GENERATOR CHANGE N/A 05/20/2011   Procedure: ICD GENERATOR CHANGE;  Surgeon: Evans Lance, MD;  Medtronic secure dual-chamber ICD serial number [id number]   . IR GASTROSTOMY TUBE MOD SED  04/19/2017  . KNEE ARTHROSCOPY     right; "just went in and scraped it"  . LEAD INSERTION N/A 03/02/2017   Procedure: LEAD INSERTION;  Surgeon: Evans Lance, MD;  Location: [facility];  Service: Cardiovascular;  Laterality: N/A;  . MASS EXCISION Right 05/10/2013   Procedure: EXCISION MASS RIGHT THUMB;  Surgeon: Wynonia Sours, MD;  Location: [place];  Service: Orthopedics;  Laterality: Right;  . PROXIMAL INTERPHALANGEAL FUSION (PIP) Right 05/10/2013   Procedure: DEBRIDEMENT PROXIMAL INTERPHALANGEAL FUSION  (PIP);  Surgeon: Wynonia Sours, MD;  Location: [facility];  Service: Orthopedics;  Laterality: Right;  . TONSILLECTOMY              Home Medications    Prior to Admission medications   Medication Sig Start Date End Date Taking? Authorizing Provider  acetaminophen (TYLENOL) 325 MG tablet Place 650 mg into feeding tube every 6 (six) hours as needed.    [provider]  ADVAIR DISKUS 250-50 MCG/DOSE AEPB Inhale 1 puff into the lungs 2 (two) times daily.  11/13/14   [provider]  Artificial Saliva (BIOTENE MOISTURIZING MOUTH) SOLN Give 1 swab by mouth every 4 hours while awake 05/04/17   [provider]  digoxin (LANOXIN) 0.05 MG/ML solution Place 2.5 mLs (0.125 mg total) into feeding tube daily. 04/26/17   Eugenie Filler, MD  furosemide (LASIX) 10 MG/ML solution Give 2 ml (20mg ) via PEG - Tube daily 05/03/17   [provider]  ipratropium (ATROVENT) 0.03 % nasal spray Place 1 spray into both nostrils 3 (three) times daily before meals. 06/02/17   [provider]  ipratropium-albuterol (DUONEB) 0.5-2.5 (3) MG/3ML SOLN Take 3 mLs by nebulization every 4 (four) hours as needed. 04/26/17   Eugenie Filler, MD  Lisinopril 1 MG/ML SOLN Place 2.5 mg into feeding tube daily. 04/26/17  Eugenie Filler, MD  metoprolol tartrate (LOPRESSOR) 25 mg/10 mL SUSP Place 10 mLs (25 mg total) into feeding tube 2 (two) times daily. 04/26/17   Eugenie Filler, MD  nitroGLYCERIN (NITROSTAT) 0.4 MG SL tablet Place 1 tablet (0.4 mg total) under the tongue every 5 (five) minutes as needed for chest pain. 04/26/17   Eugenie Filler, MD  Nutritional Supplements (FEEDING SUPPLEMENT, JEVITY 1.5 CAL,) LIQD Jevity 1.5 -  Give 240cc  Six times daily 06/27/17   [provider]  Nutritional Supplements (NUTRITIONAL SUPPLEMENT PO) NPO - nothing by mouth diet, Nothing by mouth texture HSG Puree diet - HSG Puree texture, thickened Fluids consistency     [provider]  omeprazole (PRILOSEC) 20 MG capsule Give 1 capsule enterally daily 07/04/17   [provider]  ondansetron (ZOFRAN) 4 MG tablet Place 4 mg into feeding tube every 6 (six) hours as needed for nausea or vomiting. 05/10/17   [provider]  polyethylene glycol (MIRALAX / GLYCOLAX) packet Place 17 g into feeding tube daily. 05/12/17   [provider]  potassium chloride 20 MEQ/15ML (10%) SOLN Place 7.5 mLs (10 mEq total) into feeding tube daily. 04/26/17   Eugenie Filler, MD  RESTASIS 0.05 % ophthalmic emulsion INSTILL 1 DROP INTO BOTH EYES TWICE A DAY 11/17/16   [name], MD  tiotropium (SPIRIVA) 18 MCG inhalation capsule Place 18 mcg into inhaler and inhale daily. 05/03/17   [provider]  traMADol (ULTRAM) 50 MG tablet Give 1 tablet by mouth for moderate pain or 2 tablets by mouth for severe pain every 6 hours as needed    [provider]  Water For Irrigation, Sterile (FREE WATER) SOLN Place into feeding tube every 4 (four) hours. 120 ml H2o flushes via tube every 4 hours    [provider]    Family History Family History  Problem Relation Age of Onset  . Tuberculosis Mother   . Tuberculosis Father   . Heart disease Brother   . Diabetes Sister   . Diabetes Brother   . Clotting disorder Brother     Social History Social History   Tobacco Use  . Smoking status: Former Smoker    Packs/day: 2.00    Years: 30.00    Pack years: 60.00    Types: Cigarettes    Last attempt to quit: 06/23/1976    Years since quitting: 41.2  . Smokeless tobacco: Never Used  Substance Use Topics  . Alcohol use: No  . Drug use: No     Allergies   Tamsulosin; Celebrex [celecoxib]; Dronedarone; Esomeprazole magnesium; Digoxin; Hydrocodone-acetaminophen; and Protonix [pantoprazole sodium]   Review of Systems Review of Systems  All other systems reviewed and are negative.    Physical Exam Updated Vital Signs BP  133/72 (BP Location: Right Arm) Comment: Simultaneous filing. User may not have seen previous data.  Pulse 93 Comment: Simultaneous filing. User may not have seen previous data.  Temp 97.9 F (36.6 C) (Oral)   Resp (!) 23 Comment: Simultaneous filing. User may not have seen previous data.  Ht 6\' 1"  (1.854 m)   Wt 69 kg   SpO2 95% Comment: Simultaneous filing. User may not have seen previous data.  BMI 20.07 kg/m   Physical Exam  Constitutional: He appears well-developed and well-nourished.  Non-toxic appearance. No distress.  HENT:  Head: Normocephalic and atraumatic.  Mouth/Throat: Oropharynx is clear and moist.  Eyes: Pupils are equal, round, and reactive to light.  Conjunctivae are normal. Right eye exhibits no discharge. Left eye exhibits no discharge.  Neck: Normal range of motion. Neck supple.  Cardiovascular: Normal rate, normal heart sounds and intact distal pulses. An irregularly irregular rhythm present. Exam reveals no gallop and no friction rub.  No murmur heard. Pulmonary/Chest: Effort normal and breath sounds normal. No stridor. No respiratory distress. He has no wheezes. He has no rales. He exhibits no tenderness.  Abdominal: Soft. Bowel sounds are normal. He exhibits no distension. There is tenderness in the right upper quadrant and epigastric area. There is no rigidity, no rebound, no guarding, no CVA tenderness, no tenderness at McBurney's point and negative Murphy's sign.  PEG tube in place to left upper quadrant without any signs of surrounding erythema or drainage.  No tenderness around the catheter site.  Epigastric pain to palpation.  Genitourinary:  Genitourinary Comments: Chaperone present for exam. Pt tolerated without difficulty. No external hemorrhoids or fissures noted. No pain with palpation of the rectal vault. No internal hemorrhoids noted. Soft brown stool noted in the rectal vault. No gross hematochezia or melena. Hemoccult positive.  Musculoskeletal: Normal  range of motion. He exhibits no tenderness.  Lymphadenopathy:    He has no cervical adenopathy.  Neurological: He is alert.  Alert to person but not place and time unknown baseline.  Skin: Skin is warm and dry. Capillary refill takes less than 2 seconds. No rash noted.  Psychiatric: His behavior is normal. Judgment and thought content normal.  Nursing note and vitals reviewed.    ED Treatments / Results  Labs (all labs ordered are listed, but only abnormal results are displayed) Labs Reviewed  COMPREHENSIVE METABOLIC PANEL - Abnormal; Notable for the following components:      Result Value   Glucose, Bld 139 (*)    BUN 29 (*)    Total Bilirubin 1.5 (*)    GFR calc non Af Amer 58 (*)    All other components within normal limits  CBC - Abnormal; Notable for the following components:   WBC 13.9 (*)    All other components within normal limits  URINALYSIS, ROUTINE W REFLEX MICROSCOPIC - Abnormal; Notable for the following components:   Specific Gravity, Urine 1.043 (*)    All other components within normal limits  GLUCOSE, CAPILLARY - Abnormal; Notable for the following components:   Glucose-Capillary 136 (*)    All other components within normal limits  POC OCCULT BLOOD, ED - Abnormal; Notable for the following components:   Fecal Occult Bld POSITIVE (*)    All other components within normal limits  MRSA PCR SCREENING  LIPASE, BLOOD  BASIC METABOLIC PANEL  CBC  DIGOXIN LEVEL  I-STAT CG4 LACTIC ACID, ED  TYPE AND SCREEN    EKG None  Radiology Ct Abdomen Pelvis W Contrast  Result Date: 09/30/2017 CLINICAL DATA:  Coffee ground emesis. Generalized abdominal pain. History of PEG tube, cholecystectomy, diverticulosis, rectal ulcer. EXAM: CT ABDOMEN AND PELVIS WITH CONTRAST TECHNIQUE: Multidetector CT imaging of the abdomen and pelvis was performed using the standard protocol following bolus administration of intravenous contrast. CONTRAST:  <See Chart> ISOVUE-300 IOPAMIDOL  (ISOVUE-300) INJECTION 61% COMPARISON:  CT abdomen and pelvis March 26, 2017 FINDINGS: LOWER CHEST: New lingular consolidation with ground-glass opacities. Similar dense rounded consolidation RIGHT lower lobe favoring round pneumonia. New subpulmonic RIGHT pleural effusion. HEPATOBILIARY: Status post cholecystectomy.  Normal liver. PANCREAS: Normal. SPLEEN: Calcified scarring, normal splenic size. ADRENALS/URINARY TRACT: Kidneys are orthotopic, demonstrating symmetric enhancement. 4 mm LEFT lower  pole nephrolithiasis. 1 cm cyst lower pole RIGHT kidney. She too small to characterize hypodensities bilateral kidneys. LEFT interpolar scarring. No hydronephrosis or solid renal masses. The unopacified ureters are normal in course and caliber. Delayed imaging through the kidneys demonstrates symmetric prompt contrast excretion within the proximal urinary collecting system. Urinary bladder is partially distended and unremarkable. Normal adrenal glands. STOMACH/BOWEL: Small bowel dilated to 3.7 cm. Severe small bowel pneumatosis. Mild colonic diverticulosis. Minimal likely intravenous gas about the descending colon and small bowel. Decompressed colon with air-fluid levels. Intraluminal gastrostomy tube with mild fluid distended stomach. VASCULAR/LYMPHATIC: Aortoiliac vessels are normal in course and caliber. Mild calcific atherosclerosis. No lymphadenopathy by CT size criteria. REPRODUCTIVE: Normal. OTHER: Small volume low-density ascites.  No abscess. MUSCULOSKELETAL: Nonacute. Severe degenerative changes of the thoracolumbar spine. Severe RIGHT hip osteoarthrosis. Osteopenia. Status post median sternotomy. IMPRESSION: 1. Severe small-bowel pneumatosis intestinalis with mesenteric venous gas. Resultant small bowel obstruction versus ileus. 2. Small amount of ascites. No abscess. 3. New lingular pneumonia. Stable rounded density RIGHT lung base most compatible with round pneumonia/scarring. New small RIGHT pleural  effusion. 4. Critical Value/emergent results were called by telephone at the time of interpretation on 09/30/2017 at 12:57 am to [facility] , who verbally acknowledged these results. Aortic Atherosclerosis (ICD10-I70.0). Electronically Signed   By: Elon Alas M.D.   On: 09/30/2017 01:00    Procedures Procedures (including critical care time)  Medications Ordered in ED Medications  mometasone-formoterol (DULERA) 200-5 MCG/ACT inhaler 2 puff (has no administration in time range)  ipratropium (ATROVENT) 0.06 % nasal spray 1 spray (has no administration in time range)  ipratropium-albuterol (DUONEB) 0.5-2.5 (3) MG/3ML nebulizer solution 3 mL (has no administration in time range)  metoprolol tartrate (LOPRESSOR) injection 5 mg (5 mg Intravenous Given 09/30/17 0557)  famotidine (PEPCID) IVPB 20 mg premix (has no administration in time range)  tiotropium (SPIRIVA) inhalation capsule 18 mcg (has no administration in time range)  digoxin (LANOXIN) 0.25 MG/ML injection 0.125 mg (has no administration in time range)  heparin injection 5,000 Units (has no administration in time range)  0.9 %  sodium chloride infusion ( Intravenous Rate/Dose Verify 09/30/17 0600)  acetaminophen (TYLENOL) tablet 650 mg (has no administration in time range)    Or  acetaminophen (TYLENOL) suppository 650 mg (has no administration in time range)  ondansetron (ZOFRAN-ODT) disintegrating tablet 4 mg (has no administration in time range)    Or  ondansetron (ZOFRAN) injection 4 mg (has no administration in time range)  morphine 2 MG/ML injection 2 mg (has no administration in time range)  acetaminophen (OFIRMEV) IV 1,000 mg (1,000 mg Intravenous New Bag/Given 09/30/17 0601)  ondansetron (ZOFRAN) injection 4 mg (4 mg Intravenous Given 09/29/17 2248)  iopamidol (ISOVUE-300) 61 % injection 100 mL (100 mLs Intravenous Contrast Given 09/30/17 0011)  pantoprazole (PROTONIX) injection 40 mg (40 mg Intravenous Given 09/30/17  0114)  sodium chloride 0.9 % bolus 1,000 mL (0 mLs Intravenous Stopped 09/30/17 0530)     Initial Impression / Assessment and Plan / ED Course  I have reviewed the triage vital signs and the nursing notes.  Pertinent labs & imaging results that were available during my care of the patient were reviewed by me and considered in my medical decision making (see chart for details).     Presents to the ED for evaluation of coffee-ground emesis along with abdominal pain.  On exam patient does have focal abdominal tenderness but no signs of peritonitis.  Bowel sounds present.  Patient is  afebrile.  No hypotension or tachycardia noted.  Labs show a leukocytosis of 13,000.  Otherwise lab work has been reassuring.  Lactic was normal.  UA showed no signs of infection.  Patient's occult blood was positive however there is no gross melena or hematochezia on my exam.  Was notified by the radiologist after CT abdomen pelvis that shows concern for severe small bowel pneumatosis with resultant small bowel obstruction.  Also has lingular pneumonia.  Given Zosyn after talking with Dr. Donne Hazel with general surgery.  I did speak with patient's son over in [place] concerning the patient and need for surgery.  He states that whenever his father wants to please proceed.  Dr. Donne Hazel will come to the ED to evaluate patient and place admission orders.  Unknown patient's symptoms are secondary to mesenteric ischemia.  Will need further work-up likely with vascular after surgery.  Patient on repeat exam has no signs of peritonitis.  He remains hemodynamic stable this time.  Patient was also seen by my attending who is agreed with the above plan.  Final Clinical Impressions(s) / ED Diagnoses   Final diagnoses:  Small bowel obstruction ([place])  Coffee ground emesis  Generalized abdominal pain    ED Discharge Orders    None       Aaron Edelman 09/30/17 [id number]    Orpah Greek, MD 10/01/17  [phone]

## 2017-09-29 NOTE — ED Notes (Signed)
ED Provider at bedside. 

## 2017-09-29 NOTE — ED Triage Notes (Signed)
Pt From [facility], per report "coffee grounds emesis" X1.  Not eating today.  Peg tube, reports generalized abdominal pain.  Hx of Afib, not on blood thinners.

## 2017-09-30 ENCOUNTER — Inpatient Hospital Stay (HOSPITAL_COMMUNITY): Payer: Medicare Other | Admitting: Anesthesiology

## 2017-09-30 ENCOUNTER — Encounter (HOSPITAL_COMMUNITY): Admission: EM | Disposition: A | Discharge status: Skilled Nursing Facility | Payer: Self-pay | Source: Home / Self Care

## 2017-09-30 ENCOUNTER — Emergency Department (HOSPITAL_COMMUNITY): Payer: Medicare Other

## 2017-09-30 ENCOUNTER — Inpatient Hospital Stay (HOSPITAL_COMMUNITY): Payer: Medicare Other

## 2017-09-30 DIAGNOSIS — I2510 Atherosclerotic heart disease of native coronary artery without angina pectoris: Secondary | ICD-10-CM | POA: Diagnosis present

## 2017-09-30 DIAGNOSIS — E1142 Type 2 diabetes mellitus with diabetic polyneuropathy: Secondary | ICD-10-CM | POA: Diagnosis present

## 2017-09-30 DIAGNOSIS — K920 Hematemesis: Secondary | ICD-10-CM | POA: Diagnosis present

## 2017-09-30 DIAGNOSIS — K559 Vascular disorder of intestine, unspecified: Secondary | ICD-10-CM | POA: Diagnosis present

## 2017-09-30 DIAGNOSIS — K766 Portal hypertension: Secondary | ICD-10-CM | POA: Diagnosis present

## 2017-09-30 DIAGNOSIS — I130 Hypertensive heart and chronic kidney disease with heart failure and stage 1 through stage 4 chronic kidney disease, or unspecified chronic kidney disease: Secondary | ICD-10-CM | POA: Diagnosis present

## 2017-09-30 DIAGNOSIS — D631 Anemia in chronic kidney disease: Secondary | ICD-10-CM | POA: Diagnosis present

## 2017-09-30 DIAGNOSIS — Z4682 Encounter for fitting and adjustment of non-vascular catheter: Secondary | ICD-10-CM | POA: Diagnosis not present

## 2017-09-30 DIAGNOSIS — K56609 Unspecified intestinal obstruction, unspecified as to partial versus complete obstruction: Secondary | ICD-10-CM | POA: Diagnosis not present

## 2017-09-30 DIAGNOSIS — G709 Myoneural disorder, unspecified: Secondary | ICD-10-CM | POA: Diagnosis present

## 2017-09-30 DIAGNOSIS — Z951 Presence of aortocoronary bypass graft: Secondary | ICD-10-CM | POA: Diagnosis not present

## 2017-09-30 DIAGNOSIS — J918 Pleural effusion in other conditions classified elsewhere: Secondary | ICD-10-CM | POA: Diagnosis present

## 2017-09-30 DIAGNOSIS — I5022 Chronic systolic (congestive) heart failure: Secondary | ICD-10-CM | POA: Diagnosis present

## 2017-09-30 DIAGNOSIS — Z743 Need for continuous supervision: Secondary | ICD-10-CM | POA: Diagnosis not present

## 2017-09-30 DIAGNOSIS — Z931 Gastrostomy status: Secondary | ICD-10-CM | POA: Diagnosis not present

## 2017-09-30 DIAGNOSIS — I255 Ischemic cardiomyopathy: Secondary | ICD-10-CM | POA: Diagnosis present

## 2017-09-30 DIAGNOSIS — R188 Other ascites: Secondary | ICD-10-CM | POA: Diagnosis not present

## 2017-09-30 DIAGNOSIS — K565 Intestinal adhesions [bands], unspecified as to partial versus complete obstruction: Secondary | ICD-10-CM | POA: Diagnosis present

## 2017-09-30 DIAGNOSIS — E1122 Type 2 diabetes mellitus with diabetic chronic kidney disease: Secondary | ICD-10-CM | POA: Diagnosis present

## 2017-09-30 DIAGNOSIS — E43 Unspecified severe protein-calorie malnutrition: Secondary | ICD-10-CM | POA: Diagnosis present

## 2017-09-30 DIAGNOSIS — I482 Chronic atrial fibrillation: Secondary | ICD-10-CM | POA: Diagnosis present

## 2017-09-30 DIAGNOSIS — R1312 Dysphagia, oropharyngeal phase: Secondary | ICD-10-CM | POA: Diagnosis present

## 2017-09-30 DIAGNOSIS — D649 Anemia, unspecified: Secondary | ICD-10-CM | POA: Diagnosis not present

## 2017-09-30 DIAGNOSIS — K9189 Other postprocedural complications and disorders of digestive system: Secondary | ICD-10-CM | POA: Diagnosis not present

## 2017-09-30 DIAGNOSIS — J690 Pneumonitis due to inhalation of food and vomit: Secondary | ICD-10-CM | POA: Diagnosis present

## 2017-09-30 DIAGNOSIS — Z87891 Personal history of nicotine dependence: Secondary | ICD-10-CM | POA: Diagnosis not present

## 2017-09-30 DIAGNOSIS — J181 Lobar pneumonia, unspecified organism: Secondary | ICD-10-CM | POA: Diagnosis not present

## 2017-09-30 DIAGNOSIS — N183 Chronic kidney disease, stage 3 (moderate): Secondary | ICD-10-CM | POA: Diagnosis present

## 2017-09-30 DIAGNOSIS — J9811 Atelectasis: Secondary | ICD-10-CM | POA: Diagnosis not present

## 2017-09-30 DIAGNOSIS — K5651 Intestinal adhesions [bands], with partial obstruction: Secondary | ICD-10-CM | POA: Diagnosis not present

## 2017-09-30 DIAGNOSIS — R1310 Dysphagia, unspecified: Secondary | ICD-10-CM | POA: Diagnosis not present

## 2017-09-30 DIAGNOSIS — R0602 Shortness of breath: Secondary | ICD-10-CM | POA: Diagnosis not present

## 2017-09-30 DIAGNOSIS — J90 Pleural effusion, not elsewhere classified: Secondary | ICD-10-CM | POA: Diagnosis not present

## 2017-09-30 DIAGNOSIS — R279 Unspecified lack of coordination: Secondary | ICD-10-CM | POA: Diagnosis not present

## 2017-09-30 DIAGNOSIS — K760 Fatty (change of) liver, not elsewhere classified: Secondary | ICD-10-CM | POA: Diagnosis present

## 2017-09-30 DIAGNOSIS — K6389 Other specified diseases of intestine: Secondary | ICD-10-CM | POA: Diagnosis not present

## 2017-09-30 HISTORY — PX: LAPAROTOMY: SHX154

## 2017-09-30 LAB — BASIC METABOLIC PANEL
Anion gap: 9 (ref 5–15)
BUN: 29 mg/dL — AB (ref 8–23)
CALCIUM: 8.6 mg/dL — AB (ref 8.9–10.3)
CO2: 29 mmol/L (ref 22–32)
CREATININE: 1.11 mg/dL (ref 0.61–1.24)
Chloride: 100 mmol/L (ref 98–111)
GFR calc non Af Amer: 60 mL/min — ABNORMAL LOW (ref >60)
Glucose, Bld: 158 mg/dL — ABNORMAL HIGH (ref 70–99)
Potassium: 4.3 mmol/L (ref 3.5–5.1)
Sodium: 138 mmol/L (ref 135–145)

## 2017-09-30 LAB — URINALYSIS, ROUTINE W REFLEX MICROSCOPIC
BILIRUBIN URINE: NEGATIVE
Bacteria, UA: NONE SEEN
Glucose, UA: NEGATIVE mg/dL
HGB URINE DIPSTICK: NEGATIVE
KETONES UR: NEGATIVE mg/dL
Leukocytes, UA: NEGATIVE
Nitrite: NEGATIVE
Protein, ur: NEGATIVE mg/dL
SPECIFIC GRAVITY, URINE: 1.043 — AB (ref 1.005–1.030)
pH: 8 (ref 5.0–8.0)

## 2017-09-30 LAB — GLUCOSE, CAPILLARY
GLUCOSE-CAPILLARY: 131 mg/dL — AB (ref 70–99)
Glucose-Capillary: 136 mg/dL — ABNORMAL HIGH (ref 70–99)
Glucose-Capillary: 147 mg/dL — ABNORMAL HIGH (ref 70–99)
Glucose-Capillary: 141 mg/dL — ABNORMAL HIGH (ref 70–99)

## 2017-09-30 LAB — CBC
HCT: 42.1 % (ref 39.0–52.0)
Hemoglobin: 13.2 g/dL (ref 13.0–17.0)
MCH: 29 pg (ref 26.0–34.0)
MCHC: 31.4 g/dL (ref 30.0–36.0)
MCV: 92.5 fL (ref 78.0–100.0)
PLATELETS: 145 10*3/uL — AB (ref 150–400)
RBC: 4.55 MIL/uL (ref 4.22–5.81)
RDW: 14.7 % (ref 11.5–15.5)
WBC: 21.6 10*3/uL — ABNORMAL HIGH (ref 4.0–10.5)

## 2017-09-30 LAB — MRSA PCR SCREENING: MRSA by PCR: NEGATIVE

## 2017-09-30 LAB — I-STAT CG4 LACTIC ACID, ED: LACTIC ACID, VENOUS: 1.51 mmol/L (ref 0.5–1.9)

## 2017-09-30 LAB — DIGOXIN LEVEL: DIGOXIN LVL: 1.1 ng/mL (ref 0.8–2.0)

## 2017-09-30 SURGERY — LAPAROTOMY, EXPLORATORY
Anesthesia: General | Site: Abdomen

## 2017-09-30 MED ORDER — FENTANYL CITRATE (PF) 250 MCG/5ML IJ SOLN
INTRAMUSCULAR | Status: AC
  Filled 2017-09-30: qty 5

## 2017-09-30 MED ORDER — ORAL CARE MOUTH RINSE
15.0000 mL | Freq: Two times a day (BID) | OROMUCOSAL | Status: DC
  Administered 2017-09-30 – 2017-10-06 (×10): 15 mL via OROMUCOSAL

## 2017-09-30 MED ORDER — TIOTROPIUM BROMIDE MONOHYDRATE 18 MCG IN CAPS
18.0000 ug | ORAL_CAPSULE | Freq: Every day | RESPIRATORY_TRACT | Status: DC
  Administered 2017-09-30 – 2017-10-06 (×6): 18 ug via RESPIRATORY_TRACT
  Filled 2017-09-30 (×2): qty 5

## 2017-09-30 MED ORDER — METOPROLOL TARTRATE 5 MG/5ML IV SOLN
5.0000 mg | Freq: Three times a day (TID) | INTRAVENOUS | Status: DC
  Administered 2017-09-30 – 2017-10-02 (×7): 5 mg via INTRAVENOUS
  Filled 2017-09-30 (×7): qty 5

## 2017-09-30 MED ORDER — ACETAMINOPHEN 325 MG PO TABS: 650.0000 mg | ORAL_TABLET | Freq: Four times a day (QID) | ORAL | Status: DC | PRN

## 2017-09-30 MED ORDER — PIPERACILLIN-TAZOBACTAM 3.375 G IVPB
3.3750 g | Freq: Three times a day (TID) | INTRAVENOUS | Status: DC
  Administered 2017-09-30: 3.375 g via INTRAVENOUS
  Filled 2017-09-30: qty 50

## 2017-09-30 MED ORDER — HEPARIN SODIUM (PORCINE) 5000 UNIT/ML IJ SOLN
5000.0000 [IU] | Freq: Three times a day (TID) | INTRAMUSCULAR | Status: DC
  Administered 2017-10-01 – 2017-10-06 (×14): 5000 [IU] via SUBCUTANEOUS
  Filled 2017-09-30 (×13): qty 1

## 2017-09-30 MED ORDER — IOPAMIDOL (ISOVUE-300) INJECTION 61%
100.0000 mL | Freq: Once | INTRAVENOUS | Status: AC | PRN
  Administered 2017-09-30: 100 mL via INTRAVENOUS

## 2017-09-30 MED ORDER — IPRATROPIUM BROMIDE 0.06 % NA SOLN
1.0000 | Freq: Three times a day (TID) | NASAL | Status: DC
  Administered 2017-09-30 – 2017-10-06 (×14): 1 via NASAL
  Filled 2017-09-30 (×2): qty 15

## 2017-09-30 MED ORDER — FAMOTIDINE IN NACL 20-0.9 MG/50ML-% IV SOLN
20.0000 mg | Freq: Two times a day (BID) | INTRAVENOUS | Status: DC
  Administered 2017-09-30 – 2017-10-01 (×4): 20 mg via INTRAVENOUS
  Filled 2017-09-30 (×4): qty 50

## 2017-09-30 MED ORDER — CHLORHEXIDINE GLUCONATE 0.12 % MT SOLN
15.0000 mL | Freq: Two times a day (BID) | OROMUCOSAL | Status: DC
  Administered 2017-09-30 – 2017-10-06 (×13): 15 mL via OROMUCOSAL
  Filled 2017-09-30 (×12): qty 15

## 2017-09-30 MED ORDER — ACETAMINOPHEN 10 MG/ML IV SOLN
1000.0000 mg | Freq: Four times a day (QID) | INTRAVENOUS | Status: AC
  Administered 2017-09-30 – 2017-10-01 (×4): 1000 mg via INTRAVENOUS
  Filled 2017-09-30 (×4): qty 100

## 2017-09-30 MED ORDER — SUCCINYLCHOLINE CHLORIDE 20 MG/ML IJ SOLN
INTRAMUSCULAR | Status: DC | PRN
  Administered 2017-09-30: 80 mg via INTRAVENOUS

## 2017-09-30 MED ORDER — SUGAMMADEX SODIUM 500 MG/5ML IV SOLN
INTRAVENOUS | Status: DC | PRN
  Administered 2017-09-30: 400 mg via INTRAVENOUS

## 2017-09-30 MED ORDER — ROCURONIUM BROMIDE 100 MG/10ML IV SOLN
INTRAVENOUS | Status: DC | PRN
  Administered 2017-09-30: 50 mg via INTRAVENOUS

## 2017-09-30 MED ORDER — ONDANSETRON HCL 4 MG/2ML IJ SOLN
4.0000 mg | Freq: Four times a day (QID) | INTRAMUSCULAR | Status: DC | PRN
  Administered 2017-09-30 – 2017-10-05 (×2): 4 mg via INTRAVENOUS
  Filled 2017-09-30 (×2): qty 2

## 2017-09-30 MED ORDER — OXYCODONE HCL 5 MG/5ML PO SOLN: 5.0000 mg | Freq: Once | ORAL | Status: DC | PRN

## 2017-09-30 MED ORDER — MOMETASONE FURO-FORMOTEROL FUM 200-5 MCG/ACT IN AERO
2.0000 | INHALATION_SPRAY | Freq: Two times a day (BID) | RESPIRATORY_TRACT | Status: DC
  Administered 2017-09-30 – 2017-10-06 (×13): 2 via RESPIRATORY_TRACT
  Filled 2017-09-30 (×2): qty 8.8

## 2017-09-30 MED ORDER — PHENYLEPHRINE HCL 10 MG/ML IJ SOLN
INTRAMUSCULAR | Status: DC | PRN
  Administered 2017-09-30 (×2): 40 ug via INTRAVENOUS

## 2017-09-30 MED ORDER — FENTANYL CITRATE (PF) 100 MCG/2ML IJ SOLN
INTRAMUSCULAR | Status: AC
  Filled 2017-09-30: qty 2

## 2017-09-30 MED ORDER — SODIUM CHLORIDE 0.9 % IV SOLN
INTRAVENOUS | Status: DC
  Administered 2017-09-30 – 2017-10-06 (×8): via INTRAVENOUS

## 2017-09-30 MED ORDER — OXYCODONE HCL 5 MG PO TABS: 5.0000 mg | ORAL_TABLET | Freq: Once | ORAL | Status: DC | PRN

## 2017-09-30 MED ORDER — ONDANSETRON HCL 4 MG/2ML IJ SOLN
INTRAMUSCULAR | Status: DC | PRN
  Administered 2017-09-30: 4 mg via INTRAVENOUS

## 2017-09-30 MED ORDER — IPRATROPIUM-ALBUTEROL 0.5-2.5 (3) MG/3ML IN SOLN: 3.0000 mL | RESPIRATORY_TRACT | Status: DC | PRN

## 2017-09-30 MED ORDER — DIGOXIN 0.25 MG/ML IJ SOLN
0.1250 mg | Freq: Every day | INTRAMUSCULAR | Status: DC
  Administered 2017-10-01 – 2017-10-02 (×2): 0.125 mg via INTRAVENOUS
  Filled 2017-09-30 (×2): qty 2

## 2017-09-30 MED ORDER — ONDANSETRON 4 MG PO TBDP: 4.0000 mg | ORAL_TABLET | Freq: Four times a day (QID) | ORAL | Status: DC | PRN

## 2017-09-30 MED ORDER — SODIUM CHLORIDE 0.9 % IV BOLUS
1000.0000 mL | Freq: Once | INTRAVENOUS | Status: AC
  Administered 2017-09-30: 1000 mL via INTRAVENOUS

## 2017-09-30 MED ORDER — FENTANYL CITRATE (PF) 100 MCG/2ML IJ SOLN
25.0000 ug | INTRAMUSCULAR | Status: DC | PRN
  Administered 2017-09-30: 25 ug via INTRAVENOUS

## 2017-09-30 MED ORDER — PROPOFOL 10 MG/ML IV BOLUS
INTRAVENOUS | Status: DC | PRN
  Administered 2017-09-30: 20 mg via INTRAVENOUS
  Administered 2017-09-30: 40 mg via INTRAVENOUS

## 2017-09-30 MED ORDER — PANTOPRAZOLE SODIUM 40 MG IV SOLR
40.0000 mg | Freq: Once | INTRAVENOUS | Status: AC
  Administered 2017-09-30: 40 mg via INTRAVENOUS
  Filled 2017-09-30: qty 40

## 2017-09-30 MED ORDER — LACTATED RINGERS IV SOLN
INTRAVENOUS | Status: DC | PRN
  Administered 2017-09-30: 03:00:00 via INTRAVENOUS

## 2017-09-30 MED ORDER — PROPOFOL 10 MG/ML IV BOLUS
INTRAVENOUS | Status: AC
  Filled 2017-09-30: qty 20

## 2017-09-30 MED ORDER — LIDOCAINE HCL (CARDIAC) PF 100 MG/5ML IV SOSY
PREFILLED_SYRINGE | INTRAVENOUS | Status: DC | PRN
  Administered 2017-09-30: 60 mg via INTRATRACHEAL

## 2017-09-30 MED ORDER — 0.9 % SODIUM CHLORIDE (POUR BTL) OPTIME
TOPICAL | Status: DC | PRN
  Administered 2017-09-30 (×2): 1000 mL

## 2017-09-30 MED ORDER — MORPHINE SULFATE (PF) 2 MG/ML IV SOLN
2.0000 mg | INTRAVENOUS | Status: DC | PRN
  Administered 2017-09-30 – 2017-10-04 (×14): 2 mg via INTRAVENOUS
  Filled 2017-09-30 (×14): qty 1

## 2017-09-30 MED ORDER — FENTANYL CITRATE (PF) 250 MCG/5ML IJ SOLN
INTRAMUSCULAR | Status: DC | PRN
  Administered 2017-09-30: 100 ug via INTRAVENOUS

## 2017-09-30 MED ORDER — ACETAMINOPHEN 650 MG RE SUPP: 650.0000 mg | Freq: Four times a day (QID) | RECTAL | Status: DC | PRN

## 2017-09-30 SURGICAL SUPPLY — 44 items
BLADE CLIPPER SURG (BLADE) IMPLANT
CANISTER SUCT 3000ML PPV (MISCELLANEOUS) ×3 IMPLANT
CHLORAPREP W/TINT 26ML (MISCELLANEOUS) ×3 IMPLANT
COVER SURGICAL LIGHT HANDLE (MISCELLANEOUS) ×3 IMPLANT
DRAPE LAPAROSCOPIC ABDOMINAL (DRAPES) ×3 IMPLANT
DRAPE WARM FLUID 44X44 (DRAPE) ×3 IMPLANT
DRSG OPSITE POSTOP 4X10 (GAUZE/BANDAGES/DRESSINGS) IMPLANT
DRSG OPSITE POSTOP 4X8 (GAUZE/BANDAGES/DRESSINGS) ×2 IMPLANT
ELECT BLADE 6.5 EXT (BLADE) IMPLANT
ELECT CAUTERY BLADE 6.4 (BLADE) IMPLANT
ELECT REM PT RETURN 9FT ADLT (ELECTROSURGICAL) ×3
ELECTRODE REM PT RTRN 9FT ADLT (ELECTROSURGICAL) ×1 IMPLANT
GLOVE BIO SURGEON STRL SZ7 (GLOVE) ×3 IMPLANT
GLOVE BIOGEL PI IND STRL 7.0 (GLOVE) IMPLANT
GLOVE BIOGEL PI IND STRL 7.5 (GLOVE) ×1 IMPLANT
GLOVE BIOGEL PI INDICATOR 7.0 (GLOVE) ×2
GLOVE BIOGEL PI INDICATOR 7.5 (GLOVE) ×2
GLOVE INDICATOR 7.0 STRL GRN (GLOVE) ×6 IMPLANT
GOWN STRL REUS W/ TWL LRG LVL3 (GOWN DISPOSABLE) ×2 IMPLANT
GOWN STRL REUS W/TWL LRG LVL3 (GOWN DISPOSABLE) ×6
HANDLE SUCTION POOLE (INSTRUMENTS) IMPLANT
KIT BASIN OR (CUSTOM PROCEDURE TRAY) ×3 IMPLANT
KIT TURNOVER KIT B (KITS) ×3 IMPLANT
LIGASURE IMPACT 36 18CM CVD LR (INSTRUMENTS) IMPLANT
NS IRRIG 1000ML POUR BTL (IV SOLUTION) ×6 IMPLANT
PACK GENERAL/GYN (CUSTOM PROCEDURE TRAY) ×3 IMPLANT
PAD ARMBOARD 7.5X6 YLW CONV (MISCELLANEOUS) ×3 IMPLANT
SPECIMEN JAR LARGE (MISCELLANEOUS) IMPLANT
SPONGE LAP 18X18 X RAY DECT (DISPOSABLE) IMPLANT
STAPLER VISISTAT 35W (STAPLE) ×3 IMPLANT
SUCTION POOLE HANDLE (INSTRUMENTS) ×3
SUCTION POOLE TIP (SUCTIONS) ×3 IMPLANT
SUT PDS AB 1 TP1 96 (SUTURE) ×6 IMPLANT
SUT SILK 2 0 (SUTURE) ×3
SUT SILK 2 0 SH CR/8 (SUTURE) ×3 IMPLANT
SUT SILK 2-0 18XBRD TIE 12 (SUTURE) ×1 IMPLANT
SUT SILK 3 0 (SUTURE) ×3
SUT SILK 3 0 SH CR/8 (SUTURE) ×3 IMPLANT
SUT SILK 3-0 18XBRD TIE 12 (SUTURE) ×1 IMPLANT
SUT VIC AB 3-0 SH 27 (SUTURE)
SUT VIC AB 3-0 SH 27X BRD (SUTURE) IMPLANT
TOWEL OR 17X26 10 PK STRL BLUE (TOWEL DISPOSABLE) ×3 IMPLANT
TRAY FOLEY MTR SLVR 16FR STAT (SET/KITS/TRAYS/PACK) ×3 IMPLANT
YANKAUER SUCT BULB TIP NO VENT (SUCTIONS) IMPLANT

## 2017-09-30 NOTE — Progress Notes (Signed)
Pharmacy Antibiotic Note  [name] is a 84 y.o. male admitted on 09/29/2017 with intra-abdominal infection.  Pharmacy has been consulted for Zosyn dosing. WBC elevated. Renal function good. Pt going to OR.   Plan: Zosyn 3.375G IV q8h to be infused over 4 hours Trend WBC, temp, renal function  F/U post-op findings   Height: 6\' 1"  (185.4 cm) Weight: 152 lb 1.9 oz (69 kg) IBW/kg (Calculated) : 79.9  Temp (24hrs), Avg:97.9 F (36.6 C), Min:97.9 F (36.6 C), Max:97.9 F (36.6 C)  Recent Labs  Lab 09/29/17 2217 09/30/17 0130  WBC 13.9*  --   CREATININE 1.14  --   LATICACIDVEN  --  1.51    Estimated Creatinine Clearance: 48.8 mL/min (by C-G formula based on SCr of 1.14 mg/dL).    Allergies  Allergen Reactions  . Tamsulosin Other (See Comments)    Dizziness, Made BP very low and weakness  . Celebrex [Celecoxib] Hives and Nausea And Vomiting    Gi upset  . Dronedarone Nausea And Vomiting and Other (See Comments)    GI upset, abdominal pain  . Esomeprazole Magnesium Hives and Other (See Comments)    "don't really remember"  . Digoxin Diarrhea    May have caused some diarrhea  . Hydrocodone-Acetaminophen Other (See Comments)    Bad headache  . Protonix [Pantoprazole Sodium] Nausea And Vomiting and Other (See Comments)    Tolerates Dexilant    Narda Bonds 09/30/2017 4:09 AM

## 2017-09-30 NOTE — Progress Notes (Signed)
Day of Surgery   Subjective/Chief Complaint: Patient extubated, awake, alert Complaining of abdominal pain, but it feels different than before surgery. NG and Gastrostomy functioning   Objective: Vital signs in last 24 hours: Temp:  [97.5 F (36.4 C)-98.6 F (37 C)] 97.9 F (36.6 C) (08/31 0600) Pulse Rate:  [62-99] 72 (08/31 0700) Resp:  [14-25] 23 (08/31 0700) BP: (111-163)/(63-97) 120/63 (08/31 0700) SpO2:  [94 %-100 %] 100 % (08/31 0700) Weight:  [68.3 kg-69 kg] 68.3 kg (08/31 0545)    Intake/Output from previous day: 08/30 0701 - 08/31 0700 In: 991.2 [I.V.:878.7; IV Piggyback:112.5] Out: 743 [Urine:368; Drains:325; Blood:50] Intake/Output this shift: No intake/output data recorded.  General appearance: alert, cooperative and no distress GI: mildly distended, incisional tenderness Dressing with minimal staining  Lab Results:  Recent Labs    09/29/17 2217  WBC 13.9*  HGB 14.4  HCT 46.8  PLT 158   BMET Recent Labs    09/29/17 2217  NA 139  K 4.4  CL 98  CO2 31  GLUCOSE 139*  BUN 29*  CREATININE 1.14  CALCIUM 9.5   PT/INR No results for input(s): LABPROT, INR in the last 72 hours. ABG No results for input(s): PHART, HCO3 in the last 72 hours.  Invalid input(s): PCO2, PO2  Studies/Results: Ct Abdomen Pelvis W Contrast  Result Date: 09/30/2017 CLINICAL DATA:  Coffee ground emesis. Generalized abdominal pain. History of PEG tube, cholecystectomy, diverticulosis, rectal ulcer. EXAM: CT ABDOMEN AND PELVIS WITH CONTRAST TECHNIQUE: Multidetector CT imaging of the abdomen and pelvis was performed using the standard protocol following bolus administration of intravenous contrast. CONTRAST:  <See Chart> ISOVUE-300 IOPAMIDOL (ISOVUE-300) INJECTION 61% COMPARISON:  CT abdomen and pelvis March 26, 2017 FINDINGS: LOWER CHEST: New lingular consolidation with ground-glass opacities. Similar dense rounded consolidation RIGHT lower lobe favoring round pneumonia. New  subpulmonic RIGHT pleural effusion. HEPATOBILIARY: Status post cholecystectomy.  Normal liver. PANCREAS: Normal. SPLEEN: Calcified scarring, normal splenic size. ADRENALS/URINARY TRACT: Kidneys are orthotopic, demonstrating symmetric enhancement. 4 mm LEFT lower pole nephrolithiasis. 1 cm cyst lower pole RIGHT kidney. She too small to characterize hypodensities bilateral kidneys. LEFT interpolar scarring. No hydronephrosis or solid renal masses. The unopacified ureters are normal in course and caliber. Delayed imaging through the kidneys demonstrates symmetric prompt contrast excretion within the proximal urinary collecting system. Urinary bladder is partially distended and unremarkable. Normal adrenal glands. STOMACH/BOWEL: Small bowel dilated to 3.7 cm. Severe small bowel pneumatosis. Mild colonic diverticulosis. Minimal likely intravenous gas about the descending colon and small bowel. Decompressed colon with air-fluid levels. Intraluminal gastrostomy tube with mild fluid distended stomach. VASCULAR/LYMPHATIC: Aortoiliac vessels are normal in course and caliber. Mild calcific atherosclerosis. No lymphadenopathy by CT size criteria. REPRODUCTIVE: Normal. OTHER: Small volume low-density ascites.  No abscess. MUSCULOSKELETAL: Nonacute. Severe degenerative changes of the thoracolumbar spine. Severe RIGHT hip osteoarthrosis. Osteopenia. Status post median sternotomy. IMPRESSION: 1. Severe small-bowel pneumatosis intestinalis with mesenteric venous gas. Resultant small bowel obstruction versus ileus. 2. Small amount of ascites. No abscess. 3. New lingular pneumonia. Stable rounded density RIGHT lung base most compatible with round pneumonia/scarring. New small RIGHT pleural effusion. 4. Critical Value/emergent results were called by telephone at the time of interpretation on 09/30/2017 at 12:57 am to [facility] , who verbally acknowledged these results. Aortic Atherosclerosis (ICD10-I70.0). Electronically Signed    By: Elon Alas M.D.   On: 09/30/2017 01:00    Anti-infectives: Anti-infectives (From admission, onward)   Start     Dose/Rate Route Frequency Ordered Stop  09/30/17 0200  piperacillin-tazobactam (ZOSYN) IVPB 3.375 g  Status:  Discontinued     3.375 g 12.5 mL/hr over 240 Minutes Intravenous Every 8 hours 09/30/17 0158 09/30/17 0532      Assessment/Plan: Pneumatosis intestinalis - small bowel obstruction secondary to adhesions S/p exploratory laparotomy/ lysis of adhesions 09/30/17 - Dr. Donne Hazel  Awaiting return of bowel function NG tube today, as output decreased will d/c NG tube maybe tomorrow and leave gastrostomy to straight drain Leave Foley until tomorrow OOB to chair   LOS: 0 days    [name] 09/30/2017

## 2017-09-30 NOTE — Progress Notes (Deleted)
[facility] Progress Note Patient Name: [name] DOB: 09/17/1932 MRN: [id number]   Date of Service  09/30/2017  HPI/Events of Note  Surgeon called to have Elink cam check   eICU Interventions  Patient;s room does not have camera        [employer] 09/30/2017, 5:33 AM

## 2017-09-30 NOTE — Plan of Care (Signed)
  Problem: Education: °Goal: Knowledge of General Education information will improve °Description: Including pain rating scale, medication(s)/side effects and non-pharmacologic comfort measures °Outcome: Progressing °  °Problem: Coping: °Goal: Level of anxiety will decrease °Outcome: Progressing °  °Problem: Elimination: °Goal: Will not experience complications related to bowel motility °Outcome: Progressing °  °Problem: Pain Managment: °Goal: General experience of comfort will improve °Outcome: Progressing °  °Problem: Skin Integrity: °Goal: Risk for impaired skin integrity will decrease °Outcome: Progressing °  °

## 2017-09-30 NOTE — Transfer of Care (Signed)
Immediate Anesthesia Transfer of Care Note  [name]  Procedure(s) Performed: LAPAROTOMY, LYSIS OF ADHESION (N/A Abdomen)  Patient Location: PACU  Anesthesia Type:General  Level of Consciousness: awake  Airway & Oxygen Therapy: Patient Spontanous Breathing and Patient connected to nasal cannula oxygen  Post-op Assessment: Report given to RN and Post -op Vital signs reviewed and stable  Post vital signs: Reviewed and stable  Last Vitals:  Vitals Value Taken Time  BP 159/91 09/30/2017  4:31 AM  Temp    Pulse 95 09/30/2017  4:38 AM  Resp 24 09/30/2017  4:38 AM  SpO2 99 % 09/30/2017  4:38 AM  Vitals shown include unvalidated device data.  Last Pain:  Vitals:   09/30/17 0206  TempSrc:   PainSc: 3          Complications: No apparent anesthesia complications

## 2017-09-30 NOTE — ED Provider Notes (Addendum)
Patient presented to the ER with abdominal pain.  Patient sent from nursing home.  He has experiencing pain in his upper abdomen and then had an episode of emesis this evening that looked like coffee grounds.  Face to face Exam: HEENT - PERRLA Lungs - CTAB Heart - RRR, no M/R/G Abd -soft, nondistended; epigastric tenderness, no guarding or rebound Neuro - alert, oriented x3  Plan: Patient stable, vital signs are unremarkable.  Hemoglobin is essentially normal.  CT scan performed because of the pain.  CT scan concerning for small bowel obstruction with ischemic bowel.  General surgery consulted, patient will go to the OR.   Orpah Greek, MD 09/30/17 0638    Orpah Greek, MD 09/30/17 [phone]

## 2017-09-30 NOTE — Anesthesia Preprocedure Evaluation (Addendum)
Anesthesia Evaluation  Patient identified by MRN, date of birth, ID band Patient awake    Reviewed: Allergy & Precautions, H&P , NPO status , Patient's Chart, lab work & pertinent test results, reviewed documented beta blocker date and time   History of Anesthesia Complications Negative for: history of anesthetic complications  Airway Mallampati: II  TM Distance: >3 FB Neck ROM: Full    Dental  (+) Edentulous Upper, Edentulous Lower   Pulmonary shortness of breath, asthma , neg pneumonia , COPD, former smoker,    breath sounds clear to auscultation       Cardiovascular hypertension, Pt. on medications and Pt. 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) and +CHF  + dysrhythmias Atrial Fibrillation and Ventricular Tachycardia + Cardiac Defibrillator (for VT)  Rhythm:Irregular Rate:Normal  '13 EF 35-40%   Neuro/Psych H/o SDH '13  Neuromuscular disease    GI/Hepatic Neg liver ROS, PUD, GERD  Medicated,Possible dead bowel   Endo/Other  diabetes  Renal/GU Renal InsufficiencyRenal disease (creat 1.50)     Musculoskeletal  (+) Arthritis ,   Abdominal   Peds  Hematology  (+) anemia ,   Anesthesia Other Findings EF 35-40% per Dr Tanna Furry note (no EF reported on latest TTE), [employer] ICD (will use magnet)   Reproductive/Obstetrics                            Anesthesia Physical Anesthesia Plan  ASA: IV and emergent  Anesthesia Plan: General   Post-op Pain Management:    Induction: Intravenous  PONV Risk Score and Plan: 2 and Ondansetron and Dexamethasone  Airway Management Planned: Oral ETT  Additional Equipment:   Intra-op Plan:   Post-operative Plan: Extubation in OR and Possible Post-op intubation/ventilation  Informed Consent: I have reviewed the patients History and Physical, chart, labs and discussed the procedure including the  risks, benefits and alternatives for the proposed anesthesia with the patient or authorized representative who has indicated his/her understanding and acceptance.   Dental advisory given  Plan Discussed with: CRNA, Surgeon and Anesthesiologist  Anesthesia Plan Comments:        Anesthesia Quick Evaluation

## 2017-09-30 NOTE — Op Note (Signed)
Preoperative diagnosis: Pneumatosis intestinalis with mesenteric venous gas, abdominal pain Postoperative diagnosis: Same as above, likely bowel obstruction Procedure: Exploratory laparotomy with lysis of adhesions Surgeon: Dr. Serita Grammes Anesthesia: General Specimens: None Drains: None Estimated blood loss: Minimal Complications: None Sponge and count was correct at completion Disposition to recovery in stable condition  Indications: This is a an 84 year old male who presents with abdominal pain for about 1 week.  He has reportedly been having bowel function but this is hard to discern.  He has been having emesis today.  His white blood cell count is elevated.  He has a significant cardiac history.  He underwent a CT scan that showed significant pneumatosis intestinalis with a small amount of mesenteric venous gas.  His lactic acid is normal.  He was very tender in his supraumbilical region on his exam making me concerned with the CAT scan that he had ischemia.  I discussed all of his options with him and we elected to proceed to the operating room.  Procedure: After informed consent was obtained from the patient he was taken to the operating room.  I attempted to call his son who lives in [place] preoperatively but is unable to reach him.  He was given antibiotics.  SCDs were in place.  He was then placed under general anesthesia without complication.  He was prepped and draped in the standard sterile surgical fashion.  A surgical timeout was then performed.  A Foley catheter and a nasogastric tube were placed.  His PEG tube was placed to gravity.  I then made a midline incision and carried this to his fascia.  I incised this fascia and then entered his peritoneum bluntly.  I open his abdomen at its entirety.  He had a fair amount of fluid present in his peritoneum.  His bowel was then run.  The proximal small bowel for about two thirds of its length was dilated.  Initially this all was  somewhat dusky.  This did become much more pink at completion of the operation.  There appeared actually be a transition point that when I eviscerated him there was one small adhesion that I released.  This appeared to be causing an obstruction.  There was dilated bowel before this and then then decompressed bowel after this.  The entirety of his small bowel was thickened.  There was no mass noted.  I ran his bowel several times and there was no evidence of any perforation and the bowel was all viable upon completion.  It also felt like he had a palpable pulse to the bowel.  The stomach the entire colon and the rectum were all viable as well.  I then irrigated.  I elected to closing with #1 looped PDS.  I stapled the wound and placed a dressing.  He tolerated this well and will be transferred to the recovery room.  He certainly still could have mesenteric ischemia but I thought the plan to return in the operating room 2 days given the way that his bowel looked was not going to be necessary.  I did try to call his son again postoperatively but was unable to reach him it again

## 2017-09-30 NOTE — H&P (Signed)
[name] is an 84 y.o. male.   Chief Complaint: abd pain HPI: 48 yom in nursing home with multiple medical problems presents with about a week of abdominal pain and some hematemesis.  He has been lethargic. He does state he thinks he has been having bms.  No fevers.  He has peg in place due to dysphagia and gets fed through this. He also has afib and icd in place. No warfarin due to sdh.  He underwent ct evaluation with significant pneumotosis and some small amt mesenteric venous gas with elevated wb. ER spoke to patient son in [place] who stated whatever his dad wanted was reasonable and has desired all treatment to this point. Has no dnr in place.  Past Medical History:  Diagnosis Date  . Abnormal thyroid scan    Abnormal thyroid imaging studies from 11/09/2010, status post ultrasound guided fine needle aspiration of the dominant left inferior thyroid nodule on 12/15/2010. Cytology report showed rare follicular epithelial cells and hemosiderin laden macrophages.  . Adenomatous colon polyp   . AICD (automatic cardioverter/defibrillator) present    a. fx lead; a. s/p lead extraction 03/02/17  . Arthritis    "all over"  . Asthma   . Atrial fibrillation ([place])    on chronic Coumadin; stopped July 2013 due to subdural hematomas  . CHF (congestive heart failure) (HCC)    EF 35-40% s/p most recent ICD generator change-out with Medtronic dual-chamber ICD 05/20/11 with explantation of previous abdominally-implanted device  . Coronary artery disease    s/p CABG 1983 and PCI/stent 2004.   . Diabetes mellitus    diet controlled  . Diverticulosis   . Dyslipidemia   . Enteritis   . Erythrocytosis   . GERD (gastroesophageal reflux disease)   . Hepatic steatosis   . Hypertension   . Ischemic cardiomyopathy    WITH CHF  . Monocytosis 04/17/2013  . Myocardial infarction ([place]) 1983; ~ 1990  . Pleural effusion    right  . Pneumonia August 2013  . Portal hypertensive gastropathy ([place])   . Rectal  ulcer   . Renal calculi   . Subdural hematoma [facility]) July 2013   Anticoagulation stopped.   . SunDown syndrome   . VT (ventricular tachycardia) ([place])     Past Surgical History:  Procedure Laterality Date  . CHOLECYSTECTOMY    . COLONOSCOPY  07/07/2011   Procedure: COLONOSCOPY;  Surgeon: Jerene Bears, MD;  Location: [facility];  Service: Gastroenterology;  Laterality: N/A;  . CORONARY ANGIOPLASTY WITH STENT PLACEMENT  2004   Tandem Cypher stents LAD  . CORONARY ARTERY BYPASS GRAFT  1983   SVG-mLAD  . ESOPHAGOGASTRODUODENOSCOPY  02/11/2011   Procedure: ESOPHAGOGASTRODUODENOSCOPY (EGD);  Surgeon: Beryle Beams, MD;  Location: [facility];  Service: Endoscopy;  Laterality: N/A;  . FLEXIBLE SIGMOIDOSCOPY N/A 02/08/2017   Procedure: FLEXIBLE SIGMOIDOSCOPY;  Surgeon: Irene Shipper, MD;  Location: [facility];  Service: Endoscopy;  Laterality: N/A;  . ICD GENERATOR CHANGEOUT N/A 04/08/2016   Procedure: ICD Generator Changeout;  Surgeon: Evans Lance, MD;  Location: [place] [facility];  Service: Cardiovascular;  Laterality: N/A;  . ICD LEAD REMOVAL N/A 03/02/2017   Procedure: ICD LEAD REMOVAL;  Surgeon: Evans Lance, MD;  Location: [place];  Service: Cardiovascular;  Laterality: N/A;  . IMPLANTABLE CARDIOVERTER DEFIBRILLATOR (ICD) GENERATOR CHANGE N/A 05/20/2011   Procedure: ICD GENERATOR CHANGE;  Surgeon: Evans Lance, MD;  Medtronic secure dual-chamber ICD serial number [id number]   .  IR GASTROSTOMY TUBE MOD SED  04/19/2017  . KNEE ARTHROSCOPY     right; "just went in and scraped it"  . LEAD INSERTION N/A 03/02/2017   Procedure: LEAD INSERTION;  Surgeon: Evans Lance, MD;  Location: [place];  Service: Cardiovascular;  Laterality: N/A;  . MASS EXCISION Right 05/10/2013   Procedure: EXCISION MASS RIGHT THUMB;  Surgeon: Wynonia Sours, MD;  Location: [place];  Service: Orthopedics;  Laterality: Right;  . PROXIMAL INTERPHALANGEAL FUSION (PIP) Right 05/10/2013   Procedure:  DEBRIDEMENT PROXIMAL INTERPHALANGEAL FUSION (PIP);  Surgeon: Wynonia Sours, MD;  Location: [place];  Service: Orthopedics;  Laterality: Right;  . TONSILLECTOMY          Family History  Problem Relation Age of Onset  . Tuberculosis Mother   . Tuberculosis Father   . Heart disease Brother   . Diabetes Sister   . Diabetes Brother   . Clotting disorder Brother    Social History:  reports that he quit smoking about 41 years ago. His smoking use included cigarettes. He has a 60.00 pack-year smoking history. He has never used smokeless tobacco. He reports that he does not drink alcohol or use drugs.  Allergies:  Allergies  Allergen Reactions  . Tamsulosin Other (See Comments)    Dizziness, Made BP very low and weakness  . Celebrex [Celecoxib] Hives and Nausea And Vomiting    Gi upset  . Dronedarone Nausea And Vomiting and Other (See Comments)    GI upset, abdominal pain  . Esomeprazole Magnesium Hives and Other (See Comments)    "don't really remember"  . Digoxin Diarrhea    May have caused some diarrhea  . Hydrocodone-Acetaminophen Other (See Comments)    Bad headache  . Protonix [Pantoprazole Sodium] Nausea And Vomiting and Other (See Comments)    Tolerates Dexilant    meds reviewed   Results for orders placed or performed during the hospital encounter of 09/29/17 (from the past 48 hour(s))  Comprehensive metabolic panel     Status: Abnormal   Collection Time: 09/29/17 10:17 PM  Result Value Ref Range   Sodium 139 135 - 145 mmol/L   Potassium 4.4 3.5 - 5.1 mmol/L   Chloride 98 98 - 111 mmol/L   CO2 31 22 - 32 mmol/L   Glucose, Bld 139 (H) 70 - 99 mg/dL   BUN 29 (H) 8 - 23 mg/dL   Creatinine, Ser 1.14 0.61 - 1.24 mg/dL   Calcium 9.5 8.9 - 10.3 mg/dL   Total Protein 7.4 6.5 - 8.1 g/dL   Albumin 4.0 3.5 - 5.0 g/dL   AST 20 15 - 41 U/L   ALT 9 0 - 44 U/L   Alkaline Phosphatase 76 38 - 126 U/L   Total Bilirubin 1.5 (H) 0.3 - 1.2 mg/dL   GFR calc non Af  Amer 58 (L) >60 mL/min   GFR calc Af Amer >60 >60 mL/min    Comment: (NOTE) The eGFR has been calculated using the CKD EPI equation. This calculation has not been validated in all clinical situations. eGFR's persistently <60 mL/min signify possible Chronic Kidney Disease.    Anion gap 10 5 - 15    Comment: Performed at [facility] [address] [place], [place] [address]  CBC     Status: Abnormal   Collection Time: 09/29/17 10:17 PM  Result Value Ref Range   WBC 13.9 (H) 4.0 - 10.5 K/uL   RBC 4.99 4.22 -  5.81 MIL/uL   Hemoglobin 14.4 13.0 - 17.0 g/dL   HCT 46.8 39.0 - 52.0 %   MCV 93.8 78.0 - 100.0 fL   MCH 28.9 26.0 - 34.0 pg   MCHC 30.8 30.0 - 36.0 g/dL   RDW 14.6 11.5 - 15.5 %   Platelets 158 150 - 400 K/uL    Comment: Performed at [facility], [place] [address] [place], [place] [address]  Lipase, blood     Status: None   Collection Time: 09/29/17 10:17 PM  Result Value Ref Range   Lipase 26 11 - 51 U/L    Comment: Performed at [facility] [address], [place], [place] [address]  Type and screen [place]     Status: None   Collection Time: 09/29/17 10:20 PM  Result Value Ref Range   ABO/RH(D) O POS    Antibody Screen NEG    Sample Expiration      10/02/2017 Performed at [facility], [place] [address], [place], [place] [address]   POC occult blood, ED     Status: Abnormal   Collection Time: 09/29/17 10:28 PM  Result Value Ref Range   Fecal Occult Bld POSITIVE (A) NEGATIVE  Urinalysis, Routine w reflex microscopic     Status: Abnormal   Collection Time: 09/30/17  1:17 AM  Result Value Ref Range   Color, Urine YELLOW YELLOW   APPearance CLEAR CLEAR   Specific Gravity, Urine 1.043 (H) 1.005 - 1.030   pH 8.0 5.0 - 8.0   Glucose, UA NEGATIVE NEGATIVE mg/dL   Hgb urine dipstick NEGATIVE NEGATIVE   Bilirubin Urine NEGATIVE NEGATIVE   Ketones, ur NEGATIVE NEGATIVE mg/dL   Protein, ur NEGATIVE NEGATIVE mg/dL   Nitrite  NEGATIVE NEGATIVE   Leukocytes, UA NEGATIVE NEGATIVE   RBC / HPF 11-20 0 - 5 RBC/hpf   WBC, UA 0-5 0 - 5 WBC/hpf   Bacteria, UA NONE SEEN NONE SEEN    Comment: Performed at [facility], [place] [address] [place], [place] [address]  I-Stat CG4 Lactic Acid, ED     Status: None   Collection Time: 09/30/17  1:30 AM  Result Value Ref Range   Lactic Acid, Venous 1.51 0.5 - 1.9 mmol/L   Ct Abdomen Pelvis W Contrast  Result Date: 09/30/2017 CLINICAL DATA:  Coffee ground emesis. Generalized abdominal pain. History of PEG tube, cholecystectomy, diverticulosis, rectal ulcer. EXAM: CT ABDOMEN AND PELVIS WITH CONTRAST TECHNIQUE: Multidetector CT imaging of the abdomen and pelvis was performed using the standard protocol following bolus administration of intravenous contrast. CONTRAST:  <See Chart> ISOVUE-300 IOPAMIDOL (ISOVUE-300) INJECTION 61% COMPARISON:  CT abdomen and pelvis March 26, 2017 FINDINGS: LOWER CHEST: New lingular consolidation with ground-glass opacities. Similar dense rounded consolidation RIGHT lower lobe favoring round pneumonia. New subpulmonic RIGHT pleural effusion. HEPATOBILIARY: Status post cholecystectomy.  Normal liver. PANCREAS: Normal. SPLEEN: Calcified scarring, normal splenic size. ADRENALS/URINARY TRACT: Kidneys are orthotopic, demonstrating symmetric enhancement. 4 mm LEFT lower pole nephrolithiasis. 1 cm cyst lower pole RIGHT kidney. She too small to characterize hypodensities bilateral kidneys. LEFT interpolar scarring. No hydronephrosis or solid renal masses. The unopacified ureters are normal in course and caliber. Delayed imaging through the kidneys demonstrates symmetric prompt contrast excretion within the proximal urinary collecting system. Urinary bladder is partially distended and unremarkable. Normal adrenal glands. STOMACH/BOWEL: Small bowel dilated to 3.7 cm. Severe small bowel pneumatosis. Mild colonic diverticulosis. Minimal likely intravenous gas about the  descending  colon and small bowel. Decompressed colon with air-fluid levels. Intraluminal gastrostomy tube with mild fluid distended stomach. VASCULAR/LYMPHATIC: Aortoiliac vessels are normal in course and caliber. Mild calcific atherosclerosis. No lymphadenopathy by CT size criteria. REPRODUCTIVE: Normal. OTHER: Small volume low-density ascites.  No abscess. MUSCULOSKELETAL: Nonacute. Severe degenerative changes of the thoracolumbar spine. Severe RIGHT hip osteoarthrosis. Osteopenia. Status post median sternotomy. IMPRESSION: 1. Severe small-bowel pneumatosis intestinalis with mesenteric venous gas. Resultant small bowel obstruction versus ileus. 2. Small amount of ascites. No abscess. 3. New lingular pneumonia. Stable rounded density RIGHT lung base most compatible with round pneumonia/scarring. New small RIGHT pleural effusion. 4. Critical Value/emergent results were called by telephone at the time of interpretation on 09/30/2017 at 12:57 am to [facility] , who verbally acknowledged these results. Aortic Atherosclerosis (ICD10-I70.0). Electronically Signed   By: Elon Alas M.D.   On: 09/30/2017 01:00    Review of Systems  Gastrointestinal: Positive for abdominal pain, nausea and vomiting.  Neurological: Positive for weakness.  All other systems reviewed and are negative.   Blood pressure 135/73, pulse 83, temperature 97.9 F (36.6 C), temperature source Oral, resp. rate (!) 23, height '6\' 1"'  (1.854 m), weight 69 kg, SpO2 98 %. Physical Exam  Constitutional: He appears well-developed and well-nourished.  HENT:  Head: Normocephalic and atraumatic.  Right Ear: External ear normal.  Left Ear: External ear normal.  Mouth/Throat: Oropharynx is clear and moist.  Eyes: Pupils are equal, round, and reactive to light. No scleral icterus.  Neck: Neck supple.  Cardiovascular: Normal rate, regular rhythm and normal heart sounds.  Respiratory: Effort normal and breath sounds normal. He has no  wheezes.  GI: He exhibits distension (mild). Bowel sounds are decreased. There is tenderness (generalized mild tenderness with more focal peritoneal signs in supraumbilical region) in the epigastric area. No hernia.  Peg in place   Musculoskeletal: Normal range of motion.  Lymphadenopathy:    He has no cervical adenopathy.  Neurological: He is alert.  Skin: Skin is warm and dry. He is not diaphoretic.     Assessment/Plan Abd pain, pneumotosis  He has worrisome ct scan. Lactate normal, elevated wbc and his exam is also somewhat concerning. I have attempted to call his son but have not been able to get in touch with him. The patient is responsive and asks appropriate questions. He clearly states that he wants to have problem addressed in whatever manner I think best.  I think with radiologic findings coupled with exam he needs to proceed to laparotomy now.  Discussed surgery possible bowel resection. Discussed findings that I could not repair with dead bowel in which I would close.   We discussed high risk nature of surgery and risks that include but not limited to bleeding, infection, long term mech ventilation. Stoma, leak of anastomosis, reoperation, renal failure, mi, stroke, pna, and death.  He understands and will proceed. Will have icu team see him postop as well.   Rolm Bookbinder, MD 09/30/2017, 2:33 AM

## 2017-09-30 NOTE — ED Notes (Signed)
Flushed peg tube and noted no blood in returned stomach contents.

## 2017-09-30 NOTE — ED Notes (Signed)
Patient transported to CT 

## 2017-09-30 NOTE — Progress Notes (Signed)
Patient noted to be admitted into hospital with the following personal belongings: a set of clothes, eye glasses, and small square [employer] device.

## 2017-09-30 NOTE — Anesthesia Procedure Notes (Signed)
Procedure Name: Intubation Date/Time: 09/30/2017 3:58 AM Performed by: Clovis Cao, CRNA Pre-anesthesia Checklist: Patient identified, Emergency Drugs available, Suction available, Patient being monitored and Timeout performed Patient Re-evaluated:Patient Re-evaluated prior to induction Oxygen Delivery Method: Circle system utilized Preoxygenation: Pre-oxygenation with 100% oxygen Induction Type: IV induction Ventilation: Mask ventilation without difficulty Laryngoscope Size: Miller and 2 Grade View: Grade I Tube type: Subglottic suction tube Tube size: 7.5 mm Number of attempts: 1 Airway Equipment and Method: Stylet Placement Confirmation: ETT inserted through vocal cords under direct vision,  positive ETCO2 and breath sounds checked- equal and bilateral Secured at: 22 cm Tube secured with: Tape Dental Injury: Teeth and Oropharynx as per pre-operative assessment

## 2017-10-01 ENCOUNTER — Inpatient Hospital Stay (HOSPITAL_COMMUNITY): Payer: Medicare Other

## 2017-10-01 DIAGNOSIS — K559 Vascular disorder of intestine, unspecified: Secondary | ICD-10-CM

## 2017-10-01 DIAGNOSIS — J90 Pleural effusion, not elsewhere classified: Secondary | ICD-10-CM

## 2017-10-01 DIAGNOSIS — J181 Lobar pneumonia, unspecified organism: Secondary | ICD-10-CM

## 2017-10-01 LAB — CBC WITH DIFFERENTIAL/PLATELET
Abs Immature Granulocytes: 0.1 10*3/uL (ref 0.0–0.1)
BASOS ABS: 0.1 10*3/uL (ref 0.0–0.1)
Basophils Relative: 0 %
Eosinophils Absolute: 0.3 10*3/uL (ref 0.0–0.7)
Eosinophils Relative: 2 %
HEMATOCRIT: 40.9 % (ref 39.0–52.0)
Hemoglobin: 12.6 g/dL — ABNORMAL LOW (ref 13.0–17.0)
IMMATURE GRANULOCYTES: 1 %
LYMPHS ABS: 1.3 10*3/uL (ref 0.7–4.0)
Lymphocytes Relative: 9 %
MCH: 29.2 pg (ref 26.0–34.0)
MCHC: 30.8 g/dL (ref 30.0–36.0)
MCV: 94.7 fL (ref 78.0–100.0)
Monocytes Absolute: 3.4 10*3/uL — ABNORMAL HIGH (ref 0.1–1.0)
Monocytes Relative: 22 %
NEUTROS PCT: 66 %
Neutro Abs: 10.1 10*3/uL — ABNORMAL HIGH (ref 1.7–7.7)
PLATELETS: 126 10*3/uL — AB (ref 150–400)
RBC: 4.32 MIL/uL (ref 4.22–5.81)
RDW: 14.8 % (ref 11.5–15.5)
WBC: 15.1 10*3/uL — AB (ref 4.0–10.5)

## 2017-10-01 LAB — GLUCOSE, CAPILLARY
GLUCOSE-CAPILLARY: 105 mg/dL — AB (ref 70–99)
GLUCOSE-CAPILLARY: 114 mg/dL — AB (ref 70–99)
GLUCOSE-CAPILLARY: 81 mg/dL (ref 70–99)
Glucose-Capillary: 120 mg/dL — ABNORMAL HIGH (ref 70–99)
Glucose-Capillary: 92 mg/dL (ref 70–99)

## 2017-10-01 LAB — DIGOXIN LEVEL: DIGOXIN LVL: 0.8 ng/mL (ref 0.8–2.0)

## 2017-10-01 MED ORDER — SODIUM CHLORIDE 0.9 % IV SOLN
1.0000 g | INTRAVENOUS | Status: DC
  Administered 2017-10-01 – 2017-10-05 (×5): 1 g via INTRAVENOUS
  Filled 2017-10-01 (×7): qty 10

## 2017-10-01 MED ORDER — SODIUM CHLORIDE 0.9 % IV SOLN
500.0000 mg | INTRAVENOUS | Status: DC
  Administered 2017-10-01 – 2017-10-02 (×2): 500 mg via INTRAVENOUS
  Filled 2017-10-01 (×4): qty 500

## 2017-10-01 MED ORDER — BLISTEX MEDICATED EX OINT
TOPICAL_OINTMENT | CUTANEOUS | Status: DC | PRN
  Filled 2017-10-01: qty 6.3

## 2017-10-01 MED ORDER — ACETAMINOPHEN 10 MG/ML IV SOLN
1000.0000 mg | Freq: Four times a day (QID) | INTRAVENOUS | Status: AC
  Administered 2017-10-01 – 2017-10-02 (×4): 1000 mg via INTRAVENOUS
  Filled 2017-10-01 (×5): qty 100

## 2017-10-01 MED ORDER — LIP MEDEX EX OINT
TOPICAL_OINTMENT | CUTANEOUS | Status: DC | PRN
  Filled 2017-10-01 (×2): qty 7

## 2017-10-01 NOTE — Consult Note (Addendum)
Medical Consultation   [name]  NOM:[id number]  DOB: Sep 25, 1932  DOA: 09/29/2017  PCP: [name], MD   Outpatient Specialists: None   Requesting physician: Dr. Rolm Bookbinder  Reason for consultation: Medical consult due to multiple medical problem and medication   History of Present Illness: [name] is an 84 y.o. male with past medical history significant for atrial fibrillation not currently on anticoagulation but he is on digoxin congestive heart failure coronary artery disease diabetes oropharyngeal dysphagia with PEG tube in place who presented  to the emergency department from a nursing home  with about a week of abdominal pain and some hematemesis.  He has been lethargic. He does state he thinks he has been having bms.  No fevers.  He has peg in place due to dysphagia and gets fed through this. He also has afib and ICD in place. No warfarin due to sdh.  He underwent ct evaluation with significant pneumotosis and some small amt mesenteric venous gas with elevated wb.  He is postoperative day 1 for laparoscopic evaluation of small bowel obstruction.He was found to have an adhesion that was most likely causing the obstruction.   Has no dnr in place. Was on IV Zosyn which has been discontinued He has not made any bowel movements or flatus      Review of Systems:  ROS Pt complains of abdominal pain  Pt denies any nausea or vomiting.  Review of systems are otherwise negative.    Past Medical History: Past Medical History:  Diagnosis Date  . Abnormal thyroid scan    Abnormal thyroid imaging studies from 11/09/2010, status post ultrasound guided fine needle aspiration of the dominant left inferior thyroid nodule on 12/15/2010. Cytology report showed rare follicular epithelial cells and hemosiderin laden macrophages.  . Adenomatous colon polyp   . AICD (automatic cardioverter/defibrillator) present    a. fx lead; a. s/p lead extraction  03/02/17  . Arthritis    "all over"  . Asthma   . Atrial fibrillation ([place])    on chronic Coumadin; stopped July 2013 due to subdural hematomas  . CHF (congestive heart failure) (HCC)    EF 35-40% s/p most recent ICD generator change-out with Medtronic dual-chamber ICD 05/20/11 with explantation of previous abdominally-implanted device  . Coronary artery disease    s/p CABG 1983 and PCI/stent 2004.   . Diabetes mellitus    diet controlled  . Diverticulosis   . Dyslipidemia   . Enteritis   . Erythrocytosis   . GERD (gastroesophageal reflux disease)   . Hepatic steatosis   . Hypertension   . Ischemic cardiomyopathy    WITH CHF  . Monocytosis 04/17/2013  . Myocardial infarction ([place]) 1983; ~ 1990  . Pleural effusion    right  . Pneumonia August 2013  . Portal hypertensive gastropathy ([place])   . Rectal ulcer   . Renal calculi   . Subdural hematoma [facility]) July 2013   Anticoagulation stopped.   . SunDown syndrome   . VT (ventricular tachycardia) ([place])     Past Surgical History: Past Surgical History:  Procedure Laterality Date  . CHOLECYSTECTOMY    . COLONOSCOPY  07/07/2011   Procedure: COLONOSCOPY;  Surgeon: Jerene Bears, MD;  Location: [facility];  Service: Gastroenterology;  Laterality: N/A;  . CORONARY ANGIOPLASTY WITH STENT PLACEMENT  2004   Tandem Cypher stents LAD  . CORONARY ARTERY BYPASS GRAFT  1983   SVG-mLAD  . ESOPHAGOGASTRODUODENOSCOPY  02/11/2011   Procedure: ESOPHAGOGASTRODUODENOSCOPY (EGD);  Surgeon: Beryle Beams, MD;  Location: [facility];  Service: Endoscopy;  Laterality: N/A;  . FLEXIBLE SIGMOIDOSCOPY N/A 02/08/2017   Procedure: FLEXIBLE SIGMOIDOSCOPY;  Surgeon: Irene Shipper, MD;  Location: [facility];  Service: Endoscopy;  Laterality: N/A;  . ICD GENERATOR CHANGEOUT N/A 04/08/2016   Procedure: ICD Generator Changeout;  Surgeon: Evans Lance, MD;  Location: [place] [facility];  Service: Cardiovascular;  Laterality: N/A;  . ICD LEAD REMOVAL N/A 03/02/2017    Procedure: ICD LEAD REMOVAL;  Surgeon: Evans Lance, MD;  Location: [place];  Service: Cardiovascular;  Laterality: N/A;  . IMPLANTABLE CARDIOVERTER DEFIBRILLATOR (ICD) GENERATOR CHANGE N/A 05/20/2011   Procedure: ICD GENERATOR CHANGE;  Surgeon: Evans Lance, MD;  Medtronic secure dual-chamber ICD serial number [id number]   . IR GASTROSTOMY TUBE MOD SED  04/19/2017  . KNEE ARTHROSCOPY     right; "just went in and scraped it"  . LEAD INSERTION N/A 03/02/2017   Procedure: LEAD INSERTION;  Surgeon: Evans Lance, MD;  Location: [place];  Service: Cardiovascular;  Laterality: N/A;  . MASS EXCISION Right 05/10/2013   Procedure: EXCISION MASS RIGHT THUMB;  Surgeon: Wynonia Sours, MD;  Location: [place];  Service: Orthopedics;  Laterality: Right;  . PROXIMAL INTERPHALANGEAL FUSION (PIP) Right 05/10/2013   Procedure: DEBRIDEMENT PROXIMAL INTERPHALANGEAL FUSION (PIP);  Surgeon: Wynonia Sours, MD;  Location: [facility];  Service: Orthopedics;  Laterality: Right;  . TONSILLECTOMY           Allergies:   Allergies  Allergen Reactions  . Tamsulosin Other (See Comments)    Dizziness, Made BP very low and weakness  . Celebrex [Celecoxib] Hives and Nausea And Vomiting    Gi upset  . Dronedarone Nausea And Vomiting and Other (See Comments)    GI upset, abdominal pain  . Esomeprazole Magnesium Hives and Other (See Comments)    "don't really remember"  . Digoxin Diarrhea    May have caused some diarrhea  . Hydrocodone-Acetaminophen Other (See Comments)    Bad headache  . Protonix [Pantoprazole Sodium] Nausea And Vomiting and Other (See Comments)    Tolerates Dexilant     Social History:  reports that he quit smoking about 41 years ago. His smoking use included cigarettes. He has a 60.00 pack-year smoking history. He has never used smokeless tobacco. He reports that he does not drink alcohol or use drugs.   Family History: Family History  Problem Relation Age of  Onset  . Tuberculosis Mother   . Tuberculosis Father   . Heart disease Brother   . Diabetes Sister   . Diabetes Brother   . Clotting disorder Brother       Physical Exam: Vitals:   10/01/17 1153 10/01/17 1200 10/01/17 1300 10/01/17 1400  BP:  116/64 135/75 130/68  Pulse:  90 (!) 147 81  Resp:  19 18 18   Temp: 98.1 F (36.7 C)     TempSrc: Oral     SpO2:   (!) 69% 100%  Weight:      Height:       Constitutional: He appears well-developed and well-nourished.  Pleasant elderly male. HENT:  Head: Normocephalic and atraumatic.  Right Ear: External ear normal.  Left Ear: External ear normal.  Mouth/Throat: Oropharynx is clear and moist.  NG tube is in place under intermittent suction Eyes: Pupils are equal, round, and  reactive to light. No scleral icterus.  Neck: Neck supple.  Cardiovascular: Irregularly irregular Respiratory: Effort normal and breath sounds normal. He has no wheezes.  GI: Bowel sounds are decreased.  Dressing is clean sutures are intact minimal to no bowel sounds noted noted abdomen nondistended, mild tenderness to light palpation ,no hernia.  Peg in place    Musculoskeletal: Normal range of motion. extremity with no edema Lymphadenopathy:    He has no cervical adenopathy.  Neurological: He is alert.  Oriented to place and person Skin: Skin is warm and dry. He is not diaphoretic.  Psych:appropriate  Data reviewed:  I have personally reviewed following labs and imaging studies Labs:  CBC: Recent Labs  Lab 09/29/17 2217 09/30/17 0736  WBC 13.9* 21.6*  HGB 14.4 13.2  HCT 46.8 42.1  MCV 93.8 92.5  PLT 158 145*    Basic Metabolic Panel: Recent Labs  Lab 09/29/17 2217 09/30/17 0736  NA 139 138  K 4.4 4.3  CL 98 100  CO2 31 29  GLUCOSE 139* 158*  BUN 29* 29*  CREATININE 1.14 1.11  CALCIUM 9.5 8.6*   GFR Estimated Creatinine Clearance: 49.6 mL/min (by C-G formula based on SCr of 1.11 mg/dL). Liver Function Tests: Recent Labs  Lab  09/29/17 2217  AST 20  ALT 9  ALKPHOS 76  BILITOT 1.5*  PROT 7.4  ALBUMIN 4.0   Recent Labs  Lab 09/29/17 2217  LIPASE 26   No results for input(s): AMMONIA in the last 168 hours. Coagulation profile No results for input(s): INR, PROTIME in the last 168 hours.  Cardiac Enzymes: No results for input(s): CKTOTAL, CKMB, CKMBINDEX, TROPONINI in the last 168 hours. BNP: Invalid input(s): POCBNP CBG: Recent Labs  Lab 09/30/17 2024 10/01/17 0020 10/01/17 0356 10/01/17 0738 10/01/17 1154  GLUCAP 131* 120* 105* 114* 92   D-Dimer No results for input(s): DDIMER in the last 72 hours. Hgb A1c No results for input(s): HGBA1C in the last 72 hours. Lipid Profile No results for input(s): CHOL, HDL, LDLCALC, TRIG, CHOLHDL, LDLDIRECT in the last 72 hours. Thyroid function studies No results for input(s): TSH, T4TOTAL, T3FREE, THYROIDAB in the last 72 hours.  Invalid input(s): FREET3 Anemia work up No results for input(s): VITAMINB12, FOLATE, FERRITIN, TIBC, IRON, RETICCTPCT in the last 72 hours. Urinalysis    Component Value Date/Time   COLORURINE YELLOW 09/30/2017 0117   APPEARANCEUR CLEAR 09/30/2017 0117   LABSPEC 1.043 (H) 09/30/2017 0117   PHURINE 8.0 09/30/2017 0117   GLUCOSEU NEGATIVE 09/30/2017 0117   HGBUR NEGATIVE 09/30/2017 0117   BILIRUBINUR NEGATIVE 09/30/2017 0117   KETONESUR NEGATIVE 09/30/2017 0117   PROTEINUR NEGATIVE 09/30/2017 0117   UROBILINOGEN 0.2 09/09/2012 1501   NITRITE NEGATIVE 09/30/2017 0117   LEUKOCYTESUR NEGATIVE 09/30/2017 0117     Microbiology Recent Results (from the past 240 hour(s))  MRSA PCR Screening     Status: None   Collection Time: 09/30/17  5:33 AM  Result Value Ref Range Status   MRSA by PCR NEGATIVE NEGATIVE Final    Comment:        The GeneXpert MRSA Assay (FDA approved for NASAL specimens only), is one component of a comprehensive MRSA colonization surveillance program. It is not intended to diagnose MRSA infection  nor to guide or monitor treatment for MRSA infections. Performed at [facility], [place] [address], [place], [place] [address]        Inpatient Medications:   Scheduled Meds: . chlorhexidine  15 mL Mouth  Rinse BID  . digoxin  0.125 mg Intravenous Daily  . heparin  5,000 Units Subcutaneous Q8H  . ipratropium  1 spray Each Nare TID AC  . mouth rinse  15 mL Mouth Rinse q12n4p  . metoprolol tartrate  5 mg Intravenous Q8H  . mometasone-formoterol  2 puff Inhalation BID  . tiotropium  18 mcg Inhalation Daily   Continuous Infusions: . sodium chloride 75 mL/hr at 10/01/17 1000  . acetaminophen Stopped (10/01/17 0947)  . famotidine (PEPCID) IV 20 mg (10/01/17 1054)     Radiological Exams on Admission: Dg Abd 1 View  Result Date: 09/30/2017 CLINICAL DATA:  Encounter for nasogastric tube placement EXAM: ABDOMEN - 1 VIEW COMPARISON:  CT from earlier today FINDINGS: Extensive small bowel pneumatosis on the right, known from prior CT. An orogastric tube overlaps the stomach. There is also a percutaneous gastrostomy tube. Sequela of prior cardiac surgery. Detection of pneumoperitoneum is limited in the supine position. There is lower lobe atelectasis. IMPRESSION: 1. New nasogastric tube in good position. 2. Extensive pneumatosis of small bowel loops. Electronically Signed   By: Monte Fantasia M.D.   On: 09/30/2017 08:19   Ct Abdomen Pelvis W Contrast  Result Date: 09/30/2017 CLINICAL DATA:  Coffee ground emesis. Generalized abdominal pain. History of PEG tube, cholecystectomy, diverticulosis, rectal ulcer. EXAM: CT ABDOMEN AND PELVIS WITH CONTRAST TECHNIQUE: Multidetector CT imaging of the abdomen and pelvis was performed using the standard protocol following bolus administration of intravenous contrast. CONTRAST:  <See Chart> ISOVUE-300 IOPAMIDOL (ISOVUE-300) INJECTION 61% COMPARISON:  CT abdomen and pelvis March 26, 2017 FINDINGS: LOWER CHEST: New lingular consolidation with  ground-glass opacities. Similar dense rounded consolidation RIGHT lower lobe favoring round pneumonia. New subpulmonic RIGHT pleural effusion. HEPATOBILIARY: Status post cholecystectomy.  Normal liver. PANCREAS: Normal. SPLEEN: Calcified scarring, normal splenic size. ADRENALS/URINARY TRACT: Kidneys are orthotopic, demonstrating symmetric enhancement. 4 mm LEFT lower pole nephrolithiasis. 1 cm cyst lower pole RIGHT kidney. She too small to characterize hypodensities bilateral kidneys. LEFT interpolar scarring. No hydronephrosis or solid renal masses. The unopacified ureters are normal in course and caliber. Delayed imaging through the kidneys demonstrates symmetric prompt contrast excretion within the proximal urinary collecting system. Urinary bladder is partially distended and unremarkable. Normal adrenal glands. STOMACH/BOWEL: Small bowel dilated to 3.7 cm. Severe small bowel pneumatosis. Mild colonic diverticulosis. Minimal likely intravenous gas about the descending colon and small bowel. Decompressed colon with air-fluid levels. Intraluminal gastrostomy tube with mild fluid distended stomach. VASCULAR/LYMPHATIC: Aortoiliac vessels are normal in course and caliber. Mild calcific atherosclerosis. No lymphadenopathy by CT size criteria. REPRODUCTIVE: Normal. OTHER: Small volume low-density ascites.  No abscess. MUSCULOSKELETAL: Nonacute. Severe degenerative changes of the thoracolumbar spine. Severe RIGHT hip osteoarthrosis. Osteopenia. Status post median sternotomy. IMPRESSION: 1. Severe small-bowel pneumatosis intestinalis with mesenteric venous gas. Resultant small bowel obstruction versus ileus. 2. Small amount of ascites. No abscess. 3. New lingular pneumonia. Stable rounded density RIGHT lung base most compatible with round pneumonia/scarring. New small RIGHT pleural effusion. 4. Critical Value/emergent results were called by telephone at the time of interpretation on 09/30/2017 at 12:57 am to [facility] , who verbally acknowledged these results. Aortic Atherosclerosis (ICD10-I70.0). Electronically Signed   By: Elon Alas M.D.   On: 09/30/2017 01:00    Impression/Recommendations Active Problems:   Small bowel ischemia (HCC) Lingula pneumonia on the right Right pleural effusion Will monitor his digoxin level, he is currently on IV digoxin which would be switched back to p.o. once he is able to  take by mouth NG tube is still in place. Leukocytosis most likely is secondary to his obstruction and the pneumonia.  He was on Zosyn but that was discontinued he is afebrile I will start him on Rocephin and Zithromax .we will recheck his CBC today Also monitor his glucose levels with home Accu-Cheks   Thank you for this consultation.  Our [facility] hospitalist team will follow the patient with you.     Cristal Deer M.D. [facility] [email or web address] Password [email or web address]  10/01/2017, 3:05 PM

## 2017-10-01 NOTE — Progress Notes (Signed)
1 Day Post-Op   Subjective/Chief Complaint: No flatus yet, mildly confused, knows is in hospital, no real abd pain   Objective: Vital signs in last 24 hours: Temp:  [97.5 F (36.4 C)-98.2 F (36.8 C)] 97.8 F (36.6 C) (09/01 0734) Pulse Rate:  [67-137] 79 (09/01 0700) Resp:  [15-24] 17 (09/01 0700) BP: (100-136)/(54-87) 110/55 (09/01 0700) SpO2:  [94 %-100 %] 100 % (09/01 0700)    Intake/Output from previous day: 08/31 0701 - 09/01 0700 In: 1535.4 [I.V.:740.2; IV Piggyback:320.2] Out: 730 [Urine:280; Emesis/NG output:220; Drains:230] Intake/Output this shift: No intake/output data recorded.  General appearance: no distress Resp: clear to auscultation bilaterally Cardio: irregularly irregular rhythm GI: dressing clean, few bs peg in place approp tender soft  Lab Results:  Recent Labs    09/29/17 2217 09/30/17 0736  WBC 13.9* 21.6*  HGB 14.4 13.2  HCT 46.8 42.1  PLT 158 145*   BMET Recent Labs    09/29/17 2217 09/30/17 0736  NA 139 138  K 4.4 4.3  CL 98 100  CO2 31 29  GLUCOSE 139* 158*  BUN 29* 29*  CREATININE 1.14 1.11  CALCIUM 9.5 8.6*   PT/INR No results for input(s): LABPROT, INR in the last 72 hours. ABG No results for input(s): PHART, HCO3 in the last 72 hours.  Invalid input(s): PCO2, PO2  Studies/Results: Dg Abd 1 View  Result Date: 09/30/2017 CLINICAL DATA:  Encounter for nasogastric tube placement EXAM: ABDOMEN - 1 VIEW COMPARISON:  CT from earlier today FINDINGS: Extensive small bowel pneumatosis on the right, known from prior CT. An orogastric tube overlaps the stomach. There is also a percutaneous gastrostomy tube. Sequela of prior cardiac surgery. Detection of pneumoperitoneum is limited in the supine position. There is lower lobe atelectasis. IMPRESSION: 1. New nasogastric tube in good position. 2. Extensive pneumatosis of small bowel loops. Electronically Signed   By: [place] Fantasia M.D.   On: 09/30/2017 08:19   Ct Abdomen Pelvis W  Contrast  Result Date: 09/30/2017 CLINICAL DATA:  Coffee ground emesis. Generalized abdominal pain. History of PEG tube, cholecystectomy, diverticulosis, rectal ulcer. EXAM: CT ABDOMEN AND PELVIS WITH CONTRAST TECHNIQUE: Multidetector CT imaging of the abdomen and pelvis was performed using the standard protocol following bolus administration of intravenous contrast. CONTRAST:  <See Chart> ISOVUE-300 IOPAMIDOL (ISOVUE-300) INJECTION 61% COMPARISON:  CT abdomen and pelvis March 26, 2017 FINDINGS: LOWER CHEST: New lingular consolidation with ground-glass opacities. Similar dense rounded consolidation RIGHT lower lobe favoring round pneumonia. New subpulmonic RIGHT pleural effusion. HEPATOBILIARY: Status post cholecystectomy.  Normal liver. PANCREAS: Normal. SPLEEN: Calcified scarring, normal splenic size. ADRENALS/URINARY TRACT: Kidneys are orthotopic, demonstrating symmetric enhancement. 4 mm LEFT lower pole nephrolithiasis. 1 cm cyst lower pole RIGHT kidney. She too small to characterize hypodensities bilateral kidneys. LEFT interpolar scarring. No hydronephrosis or solid renal masses. The unopacified ureters are normal in course and caliber. Delayed imaging through the kidneys demonstrates symmetric prompt contrast excretion within the proximal urinary collecting system. Urinary bladder is partially distended and unremarkable. Normal adrenal glands. STOMACH/BOWEL: Small bowel dilated to 3.7 cm. Severe small bowel pneumatosis. Mild colonic diverticulosis. Minimal likely intravenous gas about the descending colon and small bowel. Decompressed colon with air-fluid levels. Intraluminal gastrostomy tube with mild fluid distended stomach. VASCULAR/LYMPHATIC: Aortoiliac vessels are normal in course and caliber. Mild calcific atherosclerosis. No lymphadenopathy by CT size criteria. REPRODUCTIVE: Normal. OTHER: Small volume low-density ascites.  No abscess. MUSCULOSKELETAL: Nonacute. Severe degenerative changes of the  thoracolumbar spine. Severe RIGHT hip osteoarthrosis. Osteopenia.  Status post median sternotomy. IMPRESSION: 1. Severe small-bowel pneumatosis intestinalis with mesenteric venous gas. Resultant small bowel obstruction versus ileus. 2. Small amount of ascites. No abscess. 3. New lingular pneumonia. Stable rounded density RIGHT lung base most compatible with round pneumonia/scarring. New small RIGHT pleural effusion. 4. Critical Value/emergent results were called by telephone at the time of interpretation on 09/30/2017 at 12:57 am to [facility] , who verbally acknowledged these results. Aortic Atherosclerosis (ICD10-I70.0). Electronically Signed   By: Elon Alas M.D.   On: 09/30/2017 01:00    Anti-infectives: Anti-infectives (From admission, onward)   Start     Dose/Rate Route Frequency Ordered Stop   09/30/17 0200  piperacillin-tazobactam (ZOSYN) IVPB 3.375 g  Status:  Discontinued     3.375 g 12.5 mL/hr over 240 Minutes Intravenous Every 8 hours 09/30/17 0158 09/30/17 0532      Assessment/Plan: POD 2 elap/loa-Takashi Korol  1. Continue iv tylenol for 24 hours with narcotics as backup 2. pulm toilet, oob to chair 3. Continue just ice chips, will dc ng, keep peg to gravity today, may need speech to see prior to anything more as he had peg placed for swallowing issues to begin with 4. Check labs cbc and bmet in am tomorrow 5. Sq heparin tid due to cri history,  scds for dvt proph 6. tx to stepdown today  Rolm Bookbinder 10/01/2017

## 2017-10-01 NOTE — Progress Notes (Signed)
Patient ID: [name], male   DOB: 15-Dec-1932, 84 y.o.   MRN: [id number] Chest xray earlier with lucency, no reason he would have ptx will repeat in am

## 2017-10-02 ENCOUNTER — Encounter (HOSPITAL_COMMUNITY): Payer: Self-pay | Admitting: General Surgery

## 2017-10-02 ENCOUNTER — Inpatient Hospital Stay (HOSPITAL_COMMUNITY): Payer: Medicare Other

## 2017-10-02 DIAGNOSIS — K56609 Unspecified intestinal obstruction, unspecified as to partial versus complete obstruction: Secondary | ICD-10-CM

## 2017-10-02 LAB — BASIC METABOLIC PANEL
Anion gap: 7 (ref 5–15)
BUN: 25 mg/dL — AB (ref 8–23)
CO2: 27 mmol/L (ref 22–32)
Calcium: 8.5 mg/dL — ABNORMAL LOW (ref 8.9–10.3)
Chloride: 108 mmol/L (ref 98–111)
Creatinine, Ser: 0.88 mg/dL (ref 0.61–1.24)
GFR calc Af Amer: >60 mL/min (ref >60)
GLUCOSE: 95 mg/dL (ref 70–99)
POTASSIUM: 4.2 mmol/L (ref 3.5–5.1)
Sodium: 142 mmol/L (ref 135–145)

## 2017-10-02 LAB — CBC
HCT: 42.5 % (ref 39.0–52.0)
Hemoglobin: 12.9 g/dL — ABNORMAL LOW (ref 13.0–17.0)
MCH: 28.9 pg (ref 26.0–34.0)
MCHC: 30.4 g/dL (ref 30.0–36.0)
MCV: 95.1 fL (ref 78.0–100.0)
PLATELETS: 151 10*3/uL (ref 150–400)
RBC: 4.47 MIL/uL (ref 4.22–5.81)
RDW: 14.7 % (ref 11.5–15.5)
WBC: 15.1 10*3/uL — ABNORMAL HIGH (ref 4.0–10.5)

## 2017-10-02 LAB — GLUCOSE, CAPILLARY
GLUCOSE-CAPILLARY: 80 mg/dL (ref 70–99)
GLUCOSE-CAPILLARY: 99 mg/dL (ref 70–99)
Glucose-Capillary: 75 mg/dL (ref 70–99)
Glucose-Capillary: 84 mg/dL (ref 70–99)

## 2017-10-02 MED ORDER — VITAL HIGH PROTEIN PO LIQD
1000.0000 mL | ORAL | Status: DC
  Administered 2017-10-02: 1000 mL

## 2017-10-02 MED ORDER — VITAL AF 1.2 CAL PO LIQD
1500.0000 mL | ORAL | Status: DC
  Administered 2017-10-02: 1500 mL
  Filled 2017-10-02 (×2): qty 1500

## 2017-10-02 MED ORDER — FAMOTIDINE 40 MG/5ML PO SUSR
20.0000 mg | Freq: Two times a day (BID) | ORAL | Status: DC
  Administered 2017-10-02 – 2017-10-04 (×5): 20 mg via ORAL
  Filled 2017-10-02 (×5): qty 2.5

## 2017-10-02 MED ORDER — METOPROLOL TARTRATE 25 MG PO TABS
25.0000 mg | ORAL_TABLET | Freq: Two times a day (BID) | ORAL | Status: DC
  Administered 2017-10-02 – 2017-10-04 (×4): 25 mg via ORAL
  Filled 2017-10-02 (×4): qty 1

## 2017-10-02 MED ORDER — DIGOXIN 125 MCG PO TABS
0.1250 mg | ORAL_TABLET | Freq: Every day | ORAL | Status: DC
  Administered 2017-10-03 – 2017-10-04 (×2): 0.125 mg via ORAL
  Filled 2017-10-02 (×2): qty 1

## 2017-10-02 NOTE — Progress Notes (Signed)
PROGRESS NOTE                                                                                                                                                                                                             Patient Demographics:    [name], is a 84 y.o. male, DOB - 11-24-1932, QTM:[id number]  Admit date - 09/29/2017   Admitting Physician Rolm Bookbinder, MD  Outpatient Primary MD for the patient is [name], MD  LOS - 2   Chief Complaint  Patient presents with  . Hematemesis  . Abdominal Pain       Brief Narrative    84 y.o. male with past medical history significant for atrial fibrillation not currently on anticoagulation but he is on digoxin congestive heart failure coronary artery disease diabetes oropharyngeal dysphagia with PEG tube in place who admitted under surgical service for small bowel obstruction, status post exploratory laparotomy with adhesion of lysis 09/30/2017, hospitalist consulted for medical management,   Subjective:    [name] today has, No headache, No chest pain, does report some abdominal pain, reports he is not passing gas, no bowel movement as well .   Assessment  & Plan :    Active Problems:   Small bowel ischemia (HCC)   Small bowel obstruction -Management per primary surgical team, no bowel gas or bowel movement yet, tarted on trickle tube feeds via PEG, . -We will start on incentive spirometry, consult PT, encouraged to ambulate  Right lung base pneumonia -He presents with leukocytosis, CT with evidence of right lung pneumonia, given he presents with nausea and vomiting, would get would be aspiration pneumonia, white count trending down, he is afebrile, appears to be improving, so we will not change antibiotics.  chronic dysphagia -He has PEG tube  Chronic A. Fib -Currently heart rate controlled on metoprolol and digoxin, will transition from IV to PEG, not on  anti-coagulation secondary to history of GI bleed. Patient was on metoprolol and digoxin for rate control. Patient not a anticoagulation candidate secondary to history of GI bleed.   Polyneuropathy Resume gabapentin 1 more stable  Chronic systolic heart failure/EF 35%/status post AICD Currently appears to be compensated, he is on IV fluids, will continue to monitor closely . -Continue with metoprolol, digoxin, pressure remains soft, continue to hold Lasix and lisinopril  Code Status : Full code  Family Communication  : None at bedside  Disposition Plan  : Pending PT consult  Procedures  : Post exploratory laparotomy, lysis of adhesion on 09/30/2017 by Dr. Donne Hazel  DVT Prophylaxis  :  [place] HEPARIN  Lab Results  Component Value Date   PLT 151 10/02/2017    Antibiotics  :    Anti-infectives (From admission, onward)   Start     Dose/Rate Route Frequency Ordered Stop   10/01/17 1700  cefTRIAXone (ROCEPHIN) 1 g in sodium chloride 0.9 % 100 mL IVPB     1 g 200 mL/hr over 30 Minutes Intravenous Every 24 hours 10/01/17 1613     10/01/17 1700  azithromycin (ZITHROMAX) 500 mg in sodium chloride 0.9 % 250 mL IVPB     500 mg 250 mL/hr over 60 Minutes Intravenous Every 24 hours 10/01/17 1613     09/30/17 0200  piperacillin-tazobactam (ZOSYN) IVPB 3.375 g  Status:  Discontinued     3.375 g 12.5 mL/hr over 240 Minutes Intravenous Every 8 hours 09/30/17 0158 09/30/17 0532        Objective:   Vitals:   10/02/17 0600 10/02/17 0740 10/02/17 0800 10/02/17 0812  BP:  118/67 111/69   Pulse: 75     Resp: (!) 23  20   Temp:  97.9 F (36.6 C)    TempSrc:      SpO2: 99%   98%  Weight:      Height:        Wt Readings from Last 3 Encounters:  09/30/17 68.3 kg  09/28/17 69.1 kg  08/28/17 68 kg     Intake/Output Summary (Last 24 hours) at 10/02/2017 1135 Last data filed at 10/02/2017 1000 Gross per 24 hour  Intake 2443.11 ml  Output 700 ml  Net 1743.11 ml      Physical Exam  Awake Alert, Oriented X 2, No new F.N deficits, Normal affect Symmetrical Chest wall movement, diminished air entry at the bases, clear to auscultation Irregular irregular, no rubs or gallops +ve B.Sounds, Abd Soft, midline surgical wound covered with honey mesh,PEG+. No Cyanosis, Clubbing or edema, No new Rash or bruise      Data Review:    CBC Recent Labs  Lab 09/29/17 2217 09/30/17 0736 10/01/17 1535 10/02/17 0246  WBC 13.9* 21.6* 15.1* 15.1*  HGB 14.4 13.2 12.6* 12.9*  HCT 46.8 42.1 40.9 42.5  PLT 158 145* 126* 151  MCV 93.8 92.5 94.7 95.1  MCH 28.9 29.0 29.2 28.9  MCHC 30.8 31.4 30.8 30.4  RDW 14.6 14.7 14.8 14.7  LYMPHSABS  --   --  1.3  --   MONOABS  --   --  3.4*  --   EOSABS  --   --  0.3  --   BASOSABS  --   --  0.1  --     Chemistries  Recent Labs  Lab 09/29/17 2217 09/30/17 0736 10/02/17 0246  NA 139 138 142  K 4.4 4.3 4.2  CL 98 100 108  CO2 31 29 27   GLUCOSE 139* 158* 95  BUN 29* 29* 25*  CREATININE 1.14 1.11 0.88  CALCIUM 9.5 8.6* 8.5*  AST 20  --   --   ALT 9  --   --   ALKPHOS 76  --   --   BILITOT 1.5*  --   --    ------------------------------------------------------------------------------------------------------------------ No results for input(s): CHOL, HDL, LDLCALC, TRIG, CHOLHDL, LDLDIRECT in the last 72  hours.  Lab Results  Component Value Date   HGBA1C 5.6 08/18/2011   ------------------------------------------------------------------------------------------------------------------ No results for input(s): TSH, T4TOTAL, T3FREE, THYROIDAB in the last 72 hours.  Invalid input(s): FREET3 ------------------------------------------------------------------------------------------------------------------ No results for input(s): VITAMINB12, FOLATE, FERRITIN, TIBC, IRON, RETICCTPCT in the last 72 hours.  Coagulation profile No results for input(s): INR, PROTIME in the last 168 hours.  No results for  input(s): DDIMER in the last 72 hours.  Cardiac Enzymes No results for input(s): CKMB, TROPONINI, MYOGLOBIN in the last 168 hours.  Invalid input(s): CK ------------------------------------------------------------------------------------------------------------------    Component Value Date/Time   BNP 241.6 (H) 04/07/2017 1042    Inpatient Medications  Scheduled Meds: . chlorhexidine  15 mL Mouth Rinse BID  . digoxin  0.125 mg Intravenous Daily  . famotidine  20 mg Oral BID  . feeding supplement (VITAL AF 1.2 CAL)  1,500 mL Per Tube Q24H  . heparin  5,000 Units Subcutaneous Q8H  . ipratropium  1 spray Each Nare TID AC  . mouth rinse  15 mL Mouth Rinse q12n4p  . metoprolol tartrate  5 mg Intravenous Q8H  . mometasone-formoterol  2 puff Inhalation BID  . tiotropium  18 mcg Inhalation Daily   Continuous Infusions: . sodium chloride 75 mL/hr at 10/02/17 1000  . azithromycin Stopped (10/01/17 1842)  . cefTRIAXone (ROCEPHIN)  IV Stopped (10/01/17 1728)   PRN Meds:.ipratropium-albuterol, lip balm, morphine injection, ondansetron **OR** ondansetron (ZOFRAN) IV  Micro Results Recent Results (from the past 240 hour(s))  MRSA PCR Screening     Status: None   Collection Time: 09/30/17  5:33 AM  Result Value Ref Range Status   MRSA by PCR NEGATIVE NEGATIVE Final    Comment:        The GeneXpert MRSA Assay (FDA approved for NASAL specimens only), is one component of a comprehensive MRSA colonization surveillance program. It is not intended to diagnose MRSA infection nor to guide or monitor treatment for MRSA infections. Performed at [facility], [place] [address], [place], [place] [address]     Radiology Reports Dg Abd 1 View  Result Date: 09/30/2017 CLINICAL DATA:  Encounter for nasogastric tube placement EXAM: ABDOMEN - 1 VIEW COMPARISON:  CT from earlier today FINDINGS: Extensive small bowel pneumatosis on the right, known from prior CT. An orogastric tube overlaps  the stomach. There is also a percutaneous gastrostomy tube. Sequela of prior cardiac surgery. Detection of pneumoperitoneum is limited in the supine position. There is lower lobe atelectasis. IMPRESSION: 1. New nasogastric tube in good position. 2. Extensive pneumatosis of small bowel loops. Electronically Signed   By: Monte Fantasia M.D.   On: 09/30/2017 08:19   Ct Abdomen Pelvis W Contrast  Result Date: 09/30/2017 CLINICAL DATA:  Coffee ground emesis. Generalized abdominal pain. History of PEG tube, cholecystectomy, diverticulosis, rectal ulcer. EXAM: CT ABDOMEN AND PELVIS WITH CONTRAST TECHNIQUE: Multidetector CT imaging of the abdomen and pelvis was performed using the standard protocol following bolus administration of intravenous contrast. CONTRAST:  <See Chart> ISOVUE-300 IOPAMIDOL (ISOVUE-300) INJECTION 61% COMPARISON:  CT abdomen and pelvis March 26, 2017 FINDINGS: LOWER CHEST: New lingular consolidation with ground-glass opacities. Similar dense rounded consolidation RIGHT lower lobe favoring round pneumonia. New subpulmonic RIGHT pleural effusion. HEPATOBILIARY: Status post cholecystectomy.  Normal liver. PANCREAS: Normal. SPLEEN: Calcified scarring, normal splenic size. ADRENALS/URINARY TRACT: Kidneys are orthotopic, demonstrating symmetric enhancement. 4 mm LEFT lower pole nephrolithiasis. 1 cm cyst lower pole RIGHT kidney. She too small to characterize hypodensities bilateral kidneys. LEFT  interpolar scarring. No hydronephrosis or solid renal masses. The unopacified ureters are normal in course and caliber. Delayed imaging through the kidneys demonstrates symmetric prompt contrast excretion within the proximal urinary collecting system. Urinary bladder is partially distended and unremarkable. Normal adrenal glands. STOMACH/BOWEL: Small bowel dilated to 3.7 cm. Severe small bowel pneumatosis. Mild colonic diverticulosis. Minimal likely intravenous gas about the descending colon and small bowel.  Decompressed colon with air-fluid levels. Intraluminal gastrostomy tube with mild fluid distended stomach. VASCULAR/LYMPHATIC: Aortoiliac vessels are normal in course and caliber. Mild calcific atherosclerosis. No lymphadenopathy by CT size criteria. REPRODUCTIVE: Normal. OTHER: Small volume low-density ascites.  No abscess. MUSCULOSKELETAL: Nonacute. Severe degenerative changes of the thoracolumbar spine. Severe RIGHT hip osteoarthrosis. Osteopenia. Status post median sternotomy. IMPRESSION: 1. Severe small-bowel pneumatosis intestinalis with mesenteric venous gas. Resultant small bowel obstruction versus ileus. 2. Small amount of ascites. No abscess. 3. New lingular pneumonia. Stable rounded density RIGHT lung base most compatible with round pneumonia/scarring. New small RIGHT pleural effusion. 4. Critical Value/emergent results were called by telephone at the time of interpretation on 09/30/2017 at 12:57 am to [facility] , who verbally acknowledged these results. Aortic Atherosclerosis (ICD10-I70.0). Electronically Signed   By: Elon Alas M.D.   On: 09/30/2017 01:00   Dg Chest Port 1 View  Result Date: 10/02/2017 CLINICAL DATA:  SOB (shortness of breath) EXAM: PORTABLE CHEST 1 VIEW COMPARISON:  10/01/2017 and CT chest 03/26/2017 FINDINGS: The patient has a LEFT-sided transvenous pacemaker with lead to the RIGHT ventricle. The heart size is mildly enlarged. There are small bilateral pleural effusions. Opacity at the MEDIAL LEFT lung base obscures the hemidiaphragm and is consistent with atelectasis or infiltrate. No pulmonary edema. IMPRESSION: LEFT LOWER lobe atelectasis or infiltrate.  Small effusions. Electronically Signed   By: [name] M.D.   On: 10/02/2017 08:38   Dg Chest Port 1 View  Addendum Date: 10/01/2017   ADDENDUM REPORT: 10/01/2017 18:24 ADDENDUM: These results were called by telephone at the time of interpretation on 10/01/2017 at 6:24 pm to Dr. Rolm Bookbinder , who  verbally acknowledged these results. Electronically Signed   By: Ashley Royalty M.D.   On: 10/01/2017 18:24   Result Date: 10/01/2017 CLINICAL DATA:  Encounter for brain damage due to decreased oxygen supply. EXAM: PORTABLE CHEST 1 VIEW COMPARISON:  05/22/2017 and 04/09/2017 CXR FINDINGS: 6 mm thick hyper lucency along the periphery of the right mid lung cannot exclude a small pneumothorax. No mediastinal shift is identified. The trachea is midline. No acute osseous appearing abnormality is identified. No fracture is seen. There is osteoarthritis of the glenohumeral joints bilaterally. Stable cardiomegaly with tortuous atherosclerotic nonaneurysmal appearing thoracic aorta. Hazy densities at the left lung base are noted which may reflect atelectasis or pneumonia. Chronic minimal blunting the right lateral costophrenic angle with probable small left effusion. IMPRESSION: 1. 6 mm thick hyperlucency along the periphery of the right mid lung may reflect a small pneumothorax, less than 5%. 2. Stable cardiomegaly with aortic atherosclerosis left basilar atelectasis. Probable small left effusion and chronic minimal blunting of the right lateral costophrenic angle which may reflect pleural thickening or chronic trace effusion. Electronically Signed: By: Ashley Royalty M.D. On: 10/01/2017 18:07     Phillips Climes M.D on 10/02/2017 at 11:35 AM  Between 7am to 7pm - Pager - [phone]  After 7pm go to [email or web address] - password [facility]  [facility] -  Office  [phone]

## 2017-10-02 NOTE — Progress Notes (Signed)
RN at bedside due to pt attempting to pull lines/tubes. Pt removed 1 PIV, took off [place] & was removing gown/EKG leads. Pt reoriented to time/place/situation. Pt bathed, linen changed & new PIV placed. Assessment ongoing.

## 2017-10-02 NOTE — Plan of Care (Signed)

## 2017-10-02 NOTE — Progress Notes (Signed)
2 Days Post-Op   Subjective/Chief Complaint: Awake, alert - mildly confused Complaining of pain at incision Unable to report flatus - no recorded bowel movements   Objective: Vital signs in last 24 hours: Temp:  [97.8 F (36.6 C)-98.4 F (36.9 C)] 97.9 F (36.6 C) (09/02 0740) Pulse Rate:  [75-147] 75 (09/02 0600) Resp:  [14-34] 23 (09/02 0600) BP: (109-135)/(53-82) 118/67 (09/02 0740) SpO2:  [69 %-100 %] 99 % (09/02 0600)    Intake/Output from previous day: 09/01 0701 - 09/02 0700 In: 3350 [I.V.:2318.7; IV Piggyback:1031.3] Out: 800 [Urine:550; Drains:250] Intake/Output this shift: No intake/output data recorded.   Gen:  WDWN in NAD Lungs - CTA B CV - Irregular rhythm GI - incision c/d/i; + hypoactive bowel sounds G-tube - minimal output this morning  Lab Results:  Recent Labs    10/01/17 1535 10/02/17 0246  WBC 15.1* 15.1*  HGB 12.6* 12.9*  HCT 40.9 42.5  PLT 126* 151   BMET Recent Labs    09/30/17 0736 10/02/17 0246  NA 138 142  K 4.3 4.2  CL 100 108  CO2 29 27  GLUCOSE 158* 95  BUN 29* 25*  CREATININE 1.11 0.88  CALCIUM 8.6* 8.5*   PT/INR No results for input(s): LABPROT, INR in the last 72 hours. ABG No results for input(s): PHART, HCO3 in the last 72 hours.  Invalid input(s): PCO2, PO2  Studies/Results: Dg Chest Port 1 View  Addendum Date: 10/01/2017   ADDENDUM REPORT: 10/01/2017 18:24 ADDENDUM: These results were called by telephone at the time of interpretation on 10/01/2017 at 6:24 pm to Dr. Rolm Bookbinder , who verbally acknowledged these results. Electronically Signed   By: Ashley Royalty M.D.   On: 10/01/2017 18:24   Result Date: 10/01/2017 CLINICAL DATA:  Encounter for brain damage due to decreased oxygen supply. EXAM: PORTABLE CHEST 1 VIEW COMPARISON:  05/22/2017 and 04/09/2017 CXR FINDINGS: 6 mm thick hyper lucency along the periphery of the right mid lung cannot exclude a small pneumothorax. No mediastinal shift is identified. The  trachea is midline. No acute osseous appearing abnormality is identified. No fracture is seen. There is osteoarthritis of the glenohumeral joints bilaterally. Stable cardiomegaly with tortuous atherosclerotic nonaneurysmal appearing thoracic aorta. Hazy densities at the left lung base are noted which may reflect atelectasis or pneumonia. Chronic minimal blunting the right lateral costophrenic angle with probable small left effusion. IMPRESSION: 1. 6 mm thick hyperlucency along the periphery of the right mid lung may reflect a small pneumothorax, less than 5%. 2. Stable cardiomegaly with aortic atherosclerosis left basilar atelectasis. Probable small left effusion and chronic minimal blunting of the right lateral costophrenic angle which may reflect pleural thickening or chronic trace effusion. Electronically Signed: By: Ashley Royalty M.D. On: 10/01/2017 18:07    Anti-infectives: Anti-infectives (From admission, onward)   Start     Dose/Rate Route Frequency Ordered Stop   10/01/17 1700  cefTRIAXone (ROCEPHIN) 1 g in sodium chloride 0.9 % 100 mL IVPB     1 g 200 mL/hr over 30 Minutes Intravenous Every 24 hours 10/01/17 1613     10/01/17 1700  azithromycin (ZITHROMAX) 500 mg in sodium chloride 0.9 % 250 mL IVPB     500 mg 250 mL/hr over 60 Minutes Intravenous Every 24 hours 10/01/17 1613     09/30/17 0200  piperacillin-tazobactam (ZOSYN) IVPB 3.375 g  Status:  Discontinued     3.375 g 12.5 mL/hr over 240 Minutes Intravenous Every 8 hours 09/30/17 0158 09/30/17 0532  Assessment/Plan: Small bowel obstruction - s/p exploratory laparotomy/ LOA - 8/31 - Dr. Donne Hazel R pleural effusion/ lingular pneumonia - per [facility] - Rocephin/ Zithromax Atrial fibrillation  CHF/ CAD DiabetesHypertension    Ice chips/ will need speech eval prior to attempting more PO's Initiate trickle feeds via G-tube SQ heparin Transfer to floor    LOS: 2 days    Maia Petties 10/02/2017

## 2017-10-02 NOTE — Plan of Care (Signed)
  Problem: Education: Goal: Knowledge of General Education information will improve Description Including pain rating scale, medication(s)/side effects and non-pharmacologic comfort measures Outcome: Progressing   Problem: Health Behavior/Discharge Planning: Goal: Ability to manage health-related needs will improve Outcome: Progressing   Problem: Clinical Measurements: Goal: Ability to maintain clinical measurements within normal limits will improve Outcome: Progressing Goal: Will remain free from infection Outcome: Progressing Goal: Diagnostic test results will improve Outcome: Progressing Goal: Respiratory complications will improve Outcome: Progressing Goal: Cardiovascular complication will be avoided Outcome: Progressing   Problem: Activity: Goal: Risk for activity intolerance will decrease Outcome: Progressing   Problem: Nutrition: Goal: Adequate nutrition will be maintained Outcome: Progressing   Problem: Coping: Goal: Level of anxiety will decrease Outcome: Progressing   Problem: Elimination: Goal: Will not experience complications related to bowel motility Outcome: Progressing Goal: Will not experience complications related to urinary retention Outcome: Progressing   Problem: Pain Managment: Goal: General experience of comfort will improve Outcome: Progressing   Problem: Safety: Goal: Ability to remain free from injury will improve Outcome: Progressing   Problem: Skin Integrity: Goal: Risk for impaired skin integrity will decrease Outcome: Progressing   Problem: Education: Goal: Required Educational Video(s) Outcome: Progressing   Problem: Clinical Measurements: Goal: Ability to maintain clinical measurements within normal limits will improve Outcome: Progressing Goal: Postoperative complications will be avoided or minimized Outcome: Progressing   Problem: Skin Integrity: Goal: Demonstration of wound healing without infection will  improve Outcome: Progressing Forestine Chute, RN 10/02/2017 10:50 PM

## 2017-10-02 NOTE — Progress Notes (Signed)
Initial Nutrition Assessment  DOCUMENTATION CODES:   Not applicable  INTERVENTION:   Tube Feeding:  Vital AF 1.2 @ 20 ml/hr today Goal rate: Vital AF 1.2 @ 65 ml/hr Provides 1872 kcals, 117 g of protein and 1264 mL of free water  Plan transitioning back to bolus feeding regimen once TF tolerance established   NUTRITION DIAGNOSIS:   Inadequate oral intake related to chronic illness, acute illness as evidenced by NPO status.  GOAL:   Patient will meet greater than or equal to 90% of their needs  MONITOR:   TF tolerance, Labs, Weight trends, Skin  REASON FOR ASSESSMENT:   Consult Enteral/tube feeding initiation and management  ASSESSMENT:    84 yo male admitted with abdominal pain and pneumatosis intestinalis with SBO secondary to adhesions. Pt s/p ex lap/lysis of adhesions 8/31.PMH of CHF, CAD, DM, diverticulosis, dyslipidemia, GERD, hepatic steatosis, HTN    NG and G-tube in place  Noted plan for SLP eval prior to attempting more POs. Noted G-tube placed in March of this year after pt failed multiple BSE and MBS.   Per chart review, pt takes Jevity 1.5 237 mL 6 times daily(provides 2130 kcals, 91 g of potein and 1080 mL of free waer  Per weight encounters, weight has been stable since May 2019. Pt weighed a little more on March admission.   Labs:reviewed Meds: NS at 75 ml/hr  NUTRITION - FOCUSED PHYSICAL EXAM:  Pt not available for exam on visit today  Diet Order:   Diet Order    None      EDUCATION NEEDS:   Not appropriate for education at this time  Skin:  Skin Assessment: Reviewed RN Assessment  Last BM:  no documented BM  Height:   Ht Readings from Last 1 Encounters:  09/30/17 6' (1.829 m)    Weight:   Wt Readings from Last 1 Encounters:  09/30/17 68.3 kg    Ideal Body Weight:     BMI:  Body mass index is 20.42 kg/m.  Estimated Nutritional Needs:   Kcal:  1750-1990 kcals   Protein:  100-125 g  Fluid:  >/= 1.8 L   Kerman Passey MS, RD, LDN, CNSC [phone] Pager  [phone] Weekend/On-Call Pager

## 2017-10-02 NOTE — Anesthesia Postprocedure Evaluation (Signed)
Anesthesia Post Note  [name]  Procedure(s) Performed: LAPAROTOMY, LYSIS OF ADHESION (N/A Abdomen)     Patient location during evaluation: PACU Anesthesia Type: General Level of consciousness: awake and alert Pain management: pain level controlled Vital Signs Assessment: post-procedure vital signs reviewed and stable Respiratory status: spontaneous breathing, nonlabored ventilation, respiratory function stable and patient connected to nasal cannula oxygen Cardiovascular status: blood pressure returned to baseline and stable Postop Assessment: no apparent nausea or vomiting Anesthetic complications: no    Last Vitals:  Vitals:   10/02/17 0345 10/02/17 0400  BP: 121/75 121/82  Pulse: 90 89  Resp: (!) 34 15  Temp: 36.6 C   SpO2: 100% 100%    Last Pain:  Vitals:   10/02/17 0345  TempSrc: Oral  PainSc: Asleep                 Miyanna Wiersma

## 2017-10-02 NOTE — NC FL2 (Signed)
[place] LEVEL OF CARE SCREENING TOOL     IDENTIFICATION  Patient Name: [name] Birthdate: 10/24/1932 Sex: male Admission Date (Current Location): 09/29/2017  [facility] and [place] Number:  Herbalist and Address:  [facility], [place] [address] [place], [place] [address]      Provider Number: [id number]  Attending Physician Name and Address:  Edison Pace Md, MD  Relative Name and Phone Number:  Monna Fam, [phone]    Current Level of Care: Hospital Recommended Level of Care: [place] Prior Approval Number:    Date Approved/Denied:   PASRR Number: [id number]  Discharge Plan: SNF    Current Diagnoses: Patient Active Problem List   Diagnosis Date Noted  . Small bowel ischemia ([place]) 09/30/2017  . Chronic pulmonary aspiration 07/31/2017  . H. pylori infection 07/24/2017  . Oral thrush 06/03/2017  . Chronic allergic rhinitis 05/14/2017  . Polyneuropathy 05/14/2017  . COPD (chronic obstructive pulmonary disease) ([place]) 05/02/2017  . Hypertensive heart disease with heart failure ([place]) 04/27/2017  . Adult failure to thrive   . DNR (do not resuscitate)   . Palliative care by specialist   . Malnutrition of moderate degree 04/13/2017  . CKD (chronic kidney disease), stage III ([place]) 04/07/2017  . Thyroid nodule 04/01/2017  . Chronic pulmonary embolism ([place]) 03/27/2017  . Failure of implantable cardioverter-defibrillator (ICD) lead 03/02/2017  . Chronic constipation 07/13/2016  . ICD (implantable cardioverter-defibrillator) in place 04/08/2016  . Pharyngoesophageal dysphagia 12/31/2013  . Gastroesophageal reflux disease without esophagitis 12/31/2013  . Monocytosis 04/17/2013  . Pleural effusion, right 10/03/2011  . Anemia, iron deficiency 12/29/2010  . Coronary artery disease involving native coronary artery of native heart with angina pectoris ([place]) 06/25/2010  . Chronic atrial fibrillation ([place])  12/30/2009  . Chronic systolic heart failure- EF 35-40% 12/30/2009  . Automatic implantable cardioverter-defibrillator in situ 12/30/2009    Orientation RESPIRATION BLADDER Height & Weight     Self, Situation, Place  Normal Continent Weight: 68.3 kg Height:  6' (182.9 cm)  BEHAVIORAL SYMPTOMS/MOOD NEUROLOGICAL BOWEL NUTRITION STATUS      Continent Diet(Please see DC Summary)  AMBULATORY STATUS COMMUNICATION OF NEEDS Skin   Extensive Assist Verbally Surgical wounds(Closed incision on abdomen)                       Personal Care Assistance Level of Assistance  Bathing, Feeding, Dressing Bathing Assistance: Maximum assistance Feeding assistance: Limited assistance Dressing Assistance: Limited assistance     Functional Limitations Info  Sight, Hearing, Speech Sight Info: Adequate Hearing Info: Adequate Speech Info: Adequate    SPECIAL CARE FACTORS FREQUENCY  PT (By licensed PT)     PT Frequency: 5x/week              Contractures Contractures Info: Not present    Additional Factors Info  Code Status, Allergies Code Status Info: Full Allergies Info: MRSA           Current Medications (10/02/2017):  This is the current hospital active medication list Current Facility-Administered Medications  Medication Dose Route Frequency Provider Last Rate Last Dose  . 0.9 %  sodium chloride infusion   Intravenous Continuous Rolm Bookbinder, MD 75 mL/hr at 10/02/17 0600    . azithromycin (ZITHROMAX) 500 mg in sodium chloride 0.9 % 250 mL IVPB  500 mg Intravenous Q24H Cristal Deer, MD   Stopped at 10/01/17 1842  . cefTRIAXone (ROCEPHIN) 1 g in sodium chloride 0.9 %  100 mL IVPB  1 g Intravenous Q24H Cristal Deer, MD   Stopped at 10/01/17 1728  . chlorhexidine (PERIDEX) 0.12 % solution 15 mL  15 mL Mouth Rinse BID Rolm Bookbinder, MD   15 mL at 10/01/17 2121  . digoxin (LANOXIN) 0.25 MG/ML injection 0.125 mg  0.125 mg Intravenous Daily Rolm Bookbinder, MD   0.125  mg at 10/01/17 0933  . famotidine (PEPCID) IVPB 20 mg premix  20 mg Intravenous Q12H Rolm Bookbinder, MD   Stopped at 10/01/17 2240  . feeding supplement (VITAL HIGH PROTEIN) liquid 1,000 mL  1,000 mL Per Tube Q24H Donnie Mesa, MD      . heparin injection 5,000 Units  5,000 Units Subcutaneous Q8H Rolm Bookbinder, MD   5,000 Units at 10/02/17 0602  . ipratropium (ATROVENT) 0.06 % nasal spray 1 spray  1 spray Each Nare TID Keane Scrape, MD   1 spray at 10/02/17 0831  . ipratropium-albuterol (DUONEB) 0.5-2.5 (3) MG/3ML nebulizer solution 3 mL  3 mL Nebulization Q4H PRN Rolm Bookbinder, MD      . lip balm (CARMEX) ointment   Topical PRN Rolm Bookbinder, MD      . MEDLINE mouth rinse  15 mL Mouth Rinse q12n4p Rolm Bookbinder, MD   15 mL at 10/01/17 1709  . metoprolol tartrate (LOPRESSOR) injection 5 mg  5 mg Intravenous Q8H Rolm Bookbinder, MD   5 mg at 10/02/17 0602  . mometasone-formoterol (DULERA) 200-5 MCG/ACT inhaler 2 puff  2 puff Inhalation BID Rolm Bookbinder, MD   2 puff at 10/02/17 0810  . morphine 2 MG/ML injection 2 mg  2 mg Intravenous Q2H PRN Rolm Bookbinder, MD   2 mg at 10/01/17 [phone]  . ondansetron (ZOFRAN-ODT) disintegrating tablet 4 mg  4 mg Oral Q6H PRN Rolm Bookbinder, MD       Or  . ondansetron [facility]) injection 4 mg  4 mg Intravenous Q6H PRN Rolm Bookbinder, MD   4 mg at 09/30/17 1712  . tiotropium (SPIRIVA) inhalation capsule 18 mcg  18 mcg Inhalation Daily Rolm Bookbinder, MD   18 mcg at 10/02/17 8469     Discharge Medications: Please see discharge summary for a list of discharge medications.  Relevant Imaging Results:  Relevant Lab Results:   Additional Information SS#: [id number]  Benard Halsted, LCSWA

## 2017-10-03 LAB — CBC
HCT: 38.5 % — ABNORMAL LOW (ref 39.0–52.0)
Hemoglobin: 12.1 g/dL — ABNORMAL LOW (ref 13.0–17.0)
MCH: 29.7 pg (ref 26.0–34.0)
MCHC: 31.4 g/dL (ref 30.0–36.0)
MCV: 94.6 fL (ref 78.0–100.0)
PLATELETS: 142 10*3/uL — AB (ref 150–400)
RBC: 4.07 MIL/uL — AB (ref 4.22–5.81)
RDW: 14.7 % (ref 11.5–15.5)
WBC: 9.3 10*3/uL (ref 4.0–10.5)

## 2017-10-03 LAB — GLUCOSE, CAPILLARY
GLUCOSE-CAPILLARY: 103 mg/dL — AB (ref 70–99)
GLUCOSE-CAPILLARY: 84 mg/dL (ref 70–99)
GLUCOSE-CAPILLARY: 94 mg/dL (ref 70–99)
GLUCOSE-CAPILLARY: 96 mg/dL (ref 70–99)
Glucose-Capillary: 125 mg/dL — ABNORMAL HIGH (ref 70–99)
Glucose-Capillary: 113 mg/dL — ABNORMAL HIGH (ref 70–99)
Glucose-Capillary: 110 mg/dL — ABNORMAL HIGH (ref 70–99)

## 2017-10-03 LAB — BASIC METABOLIC PANEL
ANION GAP: 10 (ref 5–15)
BUN: 23 mg/dL (ref 8–23)
CALCIUM: 8.5 mg/dL — AB (ref 8.9–10.3)
CO2: 23 mmol/L (ref 22–32)
Chloride: 111 mmol/L (ref 98–111)
Creatinine, Ser: 0.77 mg/dL (ref 0.61–1.24)
GLUCOSE: 107 mg/dL — AB (ref 70–99)
POTASSIUM: 4.8 mmol/L (ref 3.5–5.1)
Sodium: 144 mmol/L (ref 135–145)

## 2017-10-03 LAB — MAGNESIUM: MAGNESIUM: 1.8 mg/dL (ref 1.7–2.4)

## 2017-10-03 MED ORDER — VITAL AF 1.2 CAL PO LIQD
1500.0000 mL | ORAL | Status: DC
  Filled 2017-10-03 (×2): qty 1500

## 2017-10-03 MED ORDER — AZITHROMYCIN 200 MG/5ML PO SUSR
500.0000 mg | ORAL | Status: DC
  Administered 2017-10-03 – 2017-10-05 (×3): 500 mg
  Filled 2017-10-03 (×4): qty 15
  Filled 2017-10-03: qty 12.5
  Filled 2017-10-03 (×3): qty 15

## 2017-10-03 NOTE — Progress Notes (Signed)
[facility] Progress Note  3 Days Post-Op  Subjective: CC:  C/o abd pain. Oriented to person and place. Denies flatus or BM since surgery. States that at baseline he receives 100% of nutrition per G tube via bolus feeds and mobilizes independently with a walker.   Objective: Vital signs in last 24 hours: Temp:  [98.5 F (36.9 C)-98.8 F (37.1 C)] 98.5 F (36.9 C) (09/03 0436) Pulse Rate:  [82-91] 84 (09/03 0436) Resp:  [15-16] 16 (09/03 0436) BP: (111-142)/(70-90) 142/90 (09/03 0436) SpO2:  [91 %-96 %] 91 % (09/03 0808) Weight:  [68.2 kg] 68.2 kg (09/03 0436) Last BM Date: (PTA)  Intake/Output from previous day: 09/02 0701 - 09/03 0700 In: 1827.2 [I.V.:1623.9; NG/GT:183.3] Out: 525 [Urine:475; Drains:50] Intake/Output this shift: No intake/output data recorded.  PE: Gen:  Alert, NAD, pleasant and cooperative  Pulm:  Normal effort Abd: Soft, appropriately tender, present but hypoactive BS, honeycomb dressing in place with small amt dried blood, TF running at 20 mL/hr  Skin: warm and dry, no rashes  Psych: A&Ox3   Lab Results:  Recent Labs    10/02/17 0246 10/03/17 0423  WBC 15.1* 9.3  HGB 12.9* 12.1*  HCT 42.5 38.5*  PLT 151 142*   BMET Recent Labs    10/02/17 0246 10/03/17 0423  NA 142 144  K 4.2 4.8  CL 108 111  CO2 27 23  GLUCOSE 95 107*  BUN 25* 23  CREATININE 0.88 0.77  CALCIUM 8.5* 8.5*   PT/INR No results for input(s): LABPROT, INR in the last 72 hours. CMP     Component Value Date/Time   NA 144 10/03/2017 0423   NA 143 05/02/2017   NA 143 10/04/2012 0920   K 4.8 10/03/2017 0423   K 4.1 10/04/2012 0920   CL 111 10/03/2017 0423   CL 105 03/19/2012 1539   CO2 23 10/03/2017 0423   CO2 26 10/04/2012 0920   GLUCOSE 107 (H) 10/03/2017 0423   GLUCOSE 130 10/04/2012 0920   GLUCOSE 110 (H) 03/19/2012 1539   BUN 23 10/03/2017 0423   BUN 22 (A) 05/02/2017   BUN 24.9 10/04/2012 0920   CREATININE 0.77 10/03/2017 0423   CREATININE  1.6 (H) 10/04/2012 0920   CALCIUM 8.5 (L) 10/03/2017 0423   CALCIUM 9.1 10/04/2012 0920   PROT 7.4 09/29/2017 2217   PROT 7.1 10/04/2012 0920   ALBUMIN 4.0 09/29/2017 2217   ALBUMIN 3.2 (L) 10/04/2012 0920   AST 20 09/29/2017 2217   AST 22 10/04/2012 0920   ALT 9 09/29/2017 2217   ALT 14 10/04/2012 0920   ALKPHOS 76 09/29/2017 2217   ALKPHOS 96 10/04/2012 0920   BILITOT 1.5 (H) 09/29/2017 2217   BILITOT 0.51 10/04/2012 0920   GFRNONAA >60 10/03/2017 0423   GFRAA >60 10/03/2017 0423   Lipase     Component Value Date/Time   LIPASE 26 09/29/2017 2217       Studies/Results: Dg Chest Port 1 View  Result Date: 10/02/2017 CLINICAL DATA:  SOB (shortness of breath) EXAM: PORTABLE CHEST 1 VIEW COMPARISON:  10/01/2017 and CT chest 03/26/2017 FINDINGS: The patient has a LEFT-sided transvenous pacemaker with lead to the RIGHT ventricle. The heart size is mildly enlarged. There are small bilateral pleural effusions. Opacity at the MEDIAL LEFT lung base obscures the hemidiaphragm and is consistent with atelectasis or infiltrate. No pulmonary edema. IMPRESSION: LEFT LOWER lobe atelectasis or infiltrate.  Small effusions. Electronically Signed   By: [name] M.D.  On: 10/02/2017 08:38   Dg Chest Port 1 View  Addendum Date: 10/01/2017   ADDENDUM REPORT: 10/01/2017 18:24 ADDENDUM: These results were called by telephone at the time of interpretation on 10/01/2017 at 6:24 pm to Dr. Rolm Bookbinder , who verbally acknowledged these results. Electronically Signed   By: Ashley Royalty M.D.   On: 10/01/2017 18:24   Result Date: 10/01/2017 CLINICAL DATA:  Encounter for brain damage due to decreased oxygen supply. EXAM: PORTABLE CHEST 1 VIEW COMPARISON:  05/22/2017 and 04/09/2017 CXR FINDINGS: 6 mm thick hyper lucency along the periphery of the right mid lung cannot exclude a small pneumothorax. No mediastinal shift is identified. The trachea is midline. No acute osseous appearing abnormality is  identified. No fracture is seen. There is osteoarthritis of the glenohumeral joints bilaterally. Stable cardiomegaly with tortuous atherosclerotic nonaneurysmal appearing thoracic aorta. Hazy densities at the left lung base are noted which may reflect atelectasis or pneumonia. Chronic minimal blunting the right lateral costophrenic angle with probable small left effusion. IMPRESSION: 1. 6 mm thick hyperlucency along the periphery of the right mid lung may reflect a small pneumothorax, less than 5%. 2. Stable cardiomegaly with aortic atherosclerosis left basilar atelectasis. Probable small left effusion and chronic minimal blunting of the right lateral costophrenic angle which may reflect pleural thickening or chronic trace effusion. Electronically Signed: By: Ashley Royalty M.D. On: 10/01/2017 18:07    Anti-infectives: Anti-infectives (From admission, onward)   Start     Dose/Rate Route Frequency Ordered Stop   10/01/17 1700  cefTRIAXone (ROCEPHIN) 1 g in sodium chloride 0.9 % 100 mL IVPB     1 g 200 mL/hr over 30 Minutes Intravenous Every 24 hours 10/01/17 1613     10/01/17 1700  azithromycin (ZITHROMAX) 500 mg in sodium chloride 0.9 % 250 mL IVPB     500 mg 250 mL/hr over 60 Minutes Intravenous Every 24 hours 10/01/17 1613     09/30/17 0200  piperacillin-tazobactam (ZOSYN) IVPB 3.375 g  Status:  Discontinued     3.375 g 12.5 mL/hr over 240 Minutes Intravenous Every 8 hours 09/30/17 0158 09/30/17 0532     Assessment/Plan R pleural effusion/ lingular pneumonia - per TRH - Rocephin/ Zithromax Atrial fibrillation - not anticoagulated CHF/ CAD Diabetes Hypertension  Small bowel obstruction  POD #3 s/p exploratory laparotomy/ LOA - 8/31 - Dr. Donne Hazel - afebrile, VSS  - continue NPO, increase TF to 30 mL/hr, suspect pt has post-op ileus  - await further bowel function - PT/OT eval  - OOB to chair, IS!!    LOS: 3 days    Obie Dredge, [facility] Pager:  [phone]

## 2017-10-03 NOTE — Progress Notes (Signed)
Nutrition Follow-up  DOCUMENTATION CODES:   Severe malnutrition in context of chronic illness  INTERVENTION:   -Continue Vital AF 1.2 @ 30 ml/hr via PEG and advance per MD discretion to goal rate of 65 ml/hr.   Tube feeding regimen provides 1872 kcal (100% of needs), 117 grams of protein, and 1264 ml of H2O.   -Plan to transition back to bolus feeding once TF tolerance is established   NUTRITION DIAGNOSIS:   Severe Malnutrition related to chronic illness(CHF) as evidenced by severe muscle depletion, moderate muscle depletion, percent weight loss, moderate fat depletion, severe fat depletion.  Ongoing  GOAL:   Patient will meet greater than or equal to 90% of their needs  Progressing  MONITOR:   Weight trends, Labs, TF tolerance, Skin, I & O's  REASON FOR ASSESSMENT:   Consult Enteral/tube feeding initiation and management  ASSESSMENT:    84 yo male admitted with abdominal pain and pneumatosis intestinalis with SBO secondary to adhesions. Pt s/p ex lap/lysis of adhesions 8/31.PMH of CHF, CAD, DM, diverticulosis, dyslipidemia, GERD, hepatic steatosis, HTN  Spoke with pt at bedside, who has no complaints at time of visit. He was unaware that TF was running via PEG. Noted Vital AF 1.2 is infusing via PEG @ 30 ml/hr, which is providing 864 kcals, 54 grams protein, and 584 ml free water daily (meeting 49% of estimated kcals and 54% of estimated protein needs).   Pt reports no difficulty tolerating TF at home, but did not recall home regimen.   Pt reports wt loss, however, unable to provide many details ("I used to be chunky, but I'm not anymore"). Reviewed wt hx; noted pt has experienced a 22.9% wt loss over the past year, which is significant for time frame.   Labs reviewed: CBGS: 94-113.   NUTRITION - FOCUSED PHYSICAL EXAM:    Most Recent Value  Orbital Region  Moderate depletion  Upper Arm Region  Moderate depletion  Thoracic and Lumbar Region  Mild depletion   Buccal Region  Mild depletion  Temple Region  Mild depletion  Clavicle Bone Region  Severe depletion  Clavicle and Acromion Bone Region  Severe depletion  Scapular Bone Region  Severe depletion  Dorsal Hand  Mild depletion  Patellar Region  Moderate depletion  Anterior Thigh Region  Moderate depletion  Posterior Calf Region  Moderate depletion  Edema (RD Assessment)  None  Hair  Reviewed  Eyes  Reviewed  Mouth  Reviewed  Skin  Reviewed  Nails  Reviewed       Diet Order:   Diet Order    None      EDUCATION NEEDS:   No education needs have been identified at this time  Skin:  Skin Assessment: Reviewed RN Assessment  Last BM:  PTA  Height:   Ht Readings from Last 1 Encounters:  09/30/17 6' (1.829 m)    Weight:   Wt Readings from Last 1 Encounters:  10/03/17 68.2 kg    Ideal Body Weight:  80.9 kg  BMI:  Body mass index is 20.39 kg/m.  Estimated Nutritional Needs:   Kcal:  1750-1990 kcals   Protein:  100-125 g  Fluid:  >/= 1.8 L    [name], RD, LDN, CDE Pager: [phone] After hours Pager: [phone]

## 2017-10-03 NOTE — Care Management Important Message (Signed)
Important Message  Patient Details  Name: [name] MRN: [id number] Date of Birth: 1932-06-15   Medicare Important Message Given:  Yes    Orbie Pyo 10/03/2017, 4:12 PM

## 2017-10-03 NOTE — Social Work (Signed)
CSW continuing to follow.  Janet Decesare H Florrie Ramires, LCSWA [facility] [phone]   

## 2017-10-03 NOTE — Evaluation (Signed)
Occupational Therapy Evaluation Patient Details Name: [name] MRN: [id number] DOB: Dec 18, 1932 Today's Date: 10/03/2017    History of Present Illness Patient is an 84 y/o male presenting with abdominal pain and hematemesis. He underwent CT evaluation with significant pneumotosis and some small amt mesenteric venous gas with elevated wb. Exploratory laparotomy with lysis of adhesions on 09/30/17. PMH significant for AICD, AFib, CHF, CAD, DM, HTN as well as PEG placement.    Clinical Impression   PTA, pt was living at [facility] and able to complete ADL tasks with modified independence. Pt currently limited by decreased activity tolerance for ADL participation as well as abdominal pain. He was able to complete functional ambulating toilet transfers with min assist and standing grooming tasks with min guard assist. Pt would benefit from continued OT services while admitted to improve independence and safety with ADL and functional mobility. Recommend continued rehabilitation services at SNF level post-acute D/C.    Follow Up Recommendations  SNF;Supervision/Assistance - 24 hour    Equipment Recommendations  None recommended by OT    Recommendations for Other Services       Precautions / Restrictions Precautions Precautions: Fall Restrictions Weight Bearing Restrictions: No      Mobility Bed Mobility Overal bed mobility: Needs Assistance Bed Mobility: Sit to Supine     Supine to sit: Min assist Sit to supine: Min guard   General bed mobility comments: Guarding assist to return to supine in bed  Transfers Overall transfer level: Needs assistance   Transfers: Sit to/from Stand Sit to Stand: Min guard         General transfer comment: Guarding to power up. Min assist to maintain stability during ambulation.     Balance Overall balance assessment: Mild deficits observed, not formally tested                                         ADL either performed or  assessed with clinical judgement   ADL Overall ADL's : Needs assistance/impaired Eating/Feeding: Set up;Sitting   Grooming: Min guard;Standing   Upper Body Bathing: Minimal assistance;Sitting   Lower Body Bathing: Moderate assistance;Sit to/from stand   Upper Body Dressing : Minimal assistance;Sitting   Lower Body Dressing: Moderate assistance;Sit to/from stand   Toilet Transfer: Minimal assistance;Ambulation   Toileting- Clothing Manipulation and Hygiene: Minimal assistance;Sit to/from stand       Functional mobility during ADLs: Minimal assistance;Rolling walker General ADL Comments: Pt with mild instability throughout session. Able to ambulate to bathroom with min assist for toileting and grooming tasks.      Vision Patient Visual Report: No change from baseline Vision Assessment?: No apparent visual deficits     Perception     Praxis      Pertinent Vitals/Pain Pain Assessment: Faces Faces Pain Scale: Hurts even more Pain Location: abdomen Pain Descriptors / Indicators: Aching;Discomfort;Grimacing Pain Intervention(s): Limited activity within patient's tolerance;Monitored during session;Repositioned     Hand Dominance     Extremity/Trunk Assessment Upper Extremity Assessment Upper Extremity Assessment: Generalized weakness   Lower Extremity Assessment Lower Extremity Assessment: Generalized weakness   Cervical / Trunk Assessment Cervical / Trunk Assessment: Normal   Communication Communication Communication: No difficulties   Cognition Arousal/Alertness: Awake/alert Behavior During Therapy: WFL for tasks assessed/performed Overall Cognitive Status: No family/caregiver present to determine baseline cognitive functioning Area of Impairment: Memory  Memory: Decreased short-term memory         General Comments: follows commands and converses well; does not remember the name of the facility   General Comments        Exercises     Shoulder Instructions      Home Living Family/patient expects to be discharged to:: Skilled nursing facility                                 Additional Comments: pt reports that he does not remember the name of the facility      Prior Functioning/Environment Level of Independence: Independent with assistive device(s)        Comments: use of RW for mobility        OT Problem List: Decreased strength;Impaired balance (sitting and/or standing);Decreased safety awareness;Decreased knowledge of use of DME or AE;Decreased knowledge of precautions;Decreased activity tolerance;Pain      OT Treatment/Interventions: Self-care/ADL training;Therapeutic exercise;Energy conservation;DME and/or AE instruction;Therapeutic activities;Patient/family education;Balance training    OT Goals(Current goals can be found in the care plan section) Acute Rehab OT Goals Patient Stated Goal: regain mobility OT Goal Formulation: With patient Time For Goal Achievement: 10/17/17 Potential to Achieve Goals: Good ADL Goals Pt Will Perform Grooming: with modified independence;standing Pt Will Perform Upper Body Dressing: with modified independence;sitting Pt Will Perform Lower Body Dressing: with modified independence;sit to/from stand Pt Will Transfer to Toilet: with modified independence;ambulating Pt Will Perform Toileting - Clothing Manipulation and hygiene: with modified independence;sit to/from stand  OT Frequency: Min 2X/week   Barriers to D/C:            Co-evaluation              AM-PAC PT "6 Clicks" Daily Activity     Outcome Measure Help from another person eating meals?: None Help from another person taking care of personal grooming?: A Little Help from another person toileting, which includes using toliet, bedpan, or urinal?: A Little Help from another person bathing (including washing, rinsing, drying)?: A Lot Help from another person to put on and  taking off regular upper body clothing?: A Little Help from another person to put on and taking off regular lower body clothing?: A Little 6 Click Score: 18   End of Session Equipment Utilized During Treatment: Gait belt;Rolling walker Nurse Communication: Mobility status  Activity Tolerance: Patient tolerated treatment well Patient left: in bed;with call bell/phone within reach;with bed alarm set  OT Visit Diagnosis: Muscle weakness (generalized) (M62.81);Pain Pain - part of body: (abdominal )                Time: 0017-4944 OT Time Calculation (min): 22 min Charges:  OT General Charges $OT Visit: 1 Visit OT Evaluation $OT Eval Moderate Complexity: [place] Pager [phone] Office Lequire A Tavius Turgeon 10/03/2017, 1:05 PM

## 2017-10-03 NOTE — Plan of Care (Signed)
  Problem: Health Behavior/Discharge Planning: Goal: Ability to manage health-related needs will improve Outcome: Progressing   Problem: Clinical Measurements: Goal: Will remain free from infection Outcome: Progressing Goal: Respiratory complications will improve Outcome: Progressing   Problem: Activity: Goal: Risk for activity intolerance will decrease Outcome: Progressing   Problem: Nutrition: Goal: Adequate nutrition will be maintained Outcome: Progressing   Problem: Pain Managment: Goal: General experience of comfort will improve Outcome: Progressing   Problem: Skin Integrity: Goal: Risk for impaired skin integrity will decrease Outcome: Progressing

## 2017-10-03 NOTE — Evaluation (Signed)
Physical Therapy Evaluation Patient Details Name: [name] MRN: [id number] DOB: [birth year] Today's Date: 10/03/2017   History of Present Illness  Patient is an 84 y/o male presenting with abdominal pain and hematemesis. He underwent CT evaluation with significant pneumotosis and some small amt mesenteric venous gas with elevated wb. Exploratory laparotomy with lysis of adhesions on 09/30/17. PMH significant for AICD, AFib, CHF, CAD, DM, HTN as well as PEG placement.     Clinical Impression  [name] is a very pleasant 84 y/o male admitted with the above listed diagnosis. Patient reports that prior to admission he was Mod I with mobility and was a resident of SNF. Patient today requiring general Min A for transfers and mobility with RW with patient primarily limited by pain. Will recommend return to SNF with Rehab services to regain safe and independent functional mobility. Will follow acutely to facilitate return to SNF.     Follow Up Recommendations SNF    Equipment Recommendations  None recommended by PT    Recommendations for Other Services       Precautions / Restrictions Precautions Precautions: Fall Restrictions Weight Bearing Restrictions: No      Mobility  Bed Mobility Overal bed mobility: Needs Assistance Bed Mobility: Supine to Sit     Supine to sit: Min assist     General bed mobility comments: MIn A to come to EOB - pain limiting  Transfers Overall transfer level: Needs assistance   Transfers: Sit to/from Stand Sit to Stand: Min assist         General transfer comment: Min A to power up at bedside - fearful of pain  Ambulation/Gait Ambulation/Gait assistance: Min assist;Min guard Gait Distance (Feet): 15 Feet Assistive device: Rolling walker (2 wheeled) Gait Pattern/deviations: Step-to pattern;Decreased stride length;Trunk flexed Gait velocity: decreased   General Gait Details: gait limited secondary to abdominal pain. use of RW with  forward flexed posture. Motivtional cueing to complete task  Stairs            Wheelchair Mobility    Modified Rankin (Stroke Patients Only)       Balance Overall balance assessment: Mild deficits observed, not formally tested                                           Pertinent Vitals/Pain Pain Assessment: Faces Faces Pain Scale: Hurts even more Pain Location: abdomen Pain Descriptors / Indicators: Aching;Discomfort;Grimacing Pain Intervention(s): Limited activity within patient's tolerance;Monitored during session;Repositioned    Home Living Family/patient expects to be discharged to:: Skilled nursing facility                 Additional Comments: unable to state name of facility    Prior Function Level of Independence: Independent with assistive device(s)         Comments: use of RW for mobility     Hand Dominance        Extremity/Trunk Assessment   Upper Extremity Assessment Upper Extremity Assessment: Defer to OT evaluation    Lower Extremity Assessment Lower Extremity Assessment: Generalized weakness    Cervical / Trunk Assessment Cervical / Trunk Assessment: Normal  Communication   Communication: No difficulties  Cognition Arousal/Alertness: Awake/alert Behavior During Therapy: WFL for tasks assessed/performed Overall Cognitive Status: Within Functional Limits for tasks assessed  General Comments: able to participate in normal conversation without needing redirection      General Comments      Exercises     Assessment/Plan    PT Assessment Patient needs continued PT services  PT Problem List Decreased strength;Decreased activity tolerance;Decreased balance;Decreased mobility;Decreased safety awareness       PT Treatment Interventions DME instruction;Stair training;Functional mobility training;Therapeutic activities;Therapeutic exercise;Balance  training;Patient/family education    PT Goals (Current goals can be found in the Care Plan section)  Acute Rehab PT Goals Patient Stated Goal: regain mobility PT Goal Formulation: With patient Time For Goal Achievement: 10/17/17 Potential to Achieve Goals: Good    Frequency Min 3X/week   Barriers to discharge        Co-evaluation               AM-PAC PT "6 Clicks" Daily Activity  Outcome Measure Difficulty turning over in bed (including adjusting bedclothes, sheets and blankets)?: A Lot Difficulty moving from lying on back to sitting on the side of the bed? : Unable Difficulty sitting down on and standing up from a chair with arms (e.g., wheelchair, bedside commode, etc,.)?: Unable Help needed moving to and from a bed to chair (including a wheelchair)?: A Little Help needed walking in hospital room?: A Little Help needed climbing 3-5 steps with a railing? : A Lot 6 Click Score: 12    End of Session   Activity Tolerance: Patient tolerated treatment well;Patient limited by pain Patient left: in chair;with call bell/phone within reach;with chair alarm set;with nursing/sitter in room Nurse Communication: Mobility status PT Visit Diagnosis: Unsteadiness on feet (R26.81);Other abnormalities of gait and mobility (R26.89);Muscle weakness (generalized) (M62.81)    Time: 2353-6144 PT Time Calculation (min) (ACUTE ONLY): 24 min   Charges:   PT Evaluation $PT Eval Moderate Complexity: 1 Mod PT Treatments $Gait Training: 8-22 mins       Lanney Gins, PT, DPT 10/03/17 10:55 AM Pager: [phone]

## 2017-10-03 NOTE — Progress Notes (Signed)
PROGRESS NOTE                                                                                                                                                                                                             Patient Demographics:    [name], is a 84 y.o. male, DOB - Dec 05, 1932, JQG:[id number]  Admit date - 09/29/2017   Admitting Physician Rolm Bookbinder, MD  Outpatient Primary MD for the patient is [name], MD  LOS - 3                                                                                 Consult follow up   Chief Complaint  Patient presents with  . Hematemesis  . Abdominal Pain       Brief Narrative    84 y.o. male with past medical history significant for atrial fibrillation not currently on anticoagulation but he is on digoxin congestive heart failure coronary artery disease diabetes oropharyngeal dysphagia with PEG tube in place who admitted under surgical service for small bowel obstruction, status post exploratory laparotomy with adhesion of lysis 09/30/2017, hospitalist consulted for medical management,   Subjective:    [name] today has, No headache, No chest pain, or some abdominal pain, but it did improve, still reports no bowel movements, as well not passing gas .   Assessment  & Plan :    Active Problems:   Small bowel ischemia (HCC)   Small bowel obstruction -Management per primary surgical team, no bowel gas or bowel movement yet, started on trickle tube feeds via PEG, . -Encouraged to use incentive spirometry, consult PT, encouraged to ambulate  Right lung base pneumonia -He presents with leukocytosis, CT with evidence of right lung pneumonia, given he presents with nausea and vomiting, would get would be aspiration pneumonia, white count trending down, he is afebrile, appears to be improving, so we will not change antibiotics.  chronic dysphagia -He has PEG tube  Chronic A.  Fib -Currently heart rate controlled on  metoprolol and digoxin,, not on anti-coagulation secondary to history of GI bleed. Patient was on metoprolol and digoxin for rate control. Patient not a anticoagulation candidate secondary to history of GI bleed.   Polyneuropathy Resume gabapentin when  more stable  Chronic systolic heart failure/EF 35%/status post AICD Currently appears to be compensated, he is on IV fluids, will continue to monitor closely . -Continue with metoprolol, digoxin, pressure remains soft, continue to hold Lasix and lisinopril       Code Status : Full code  Family Communication  : None at bedside  Disposition Plan  : Will need SNF placement  Procedures  : Post exploratory laparotomy, lysis of adhesion on 09/30/2017 by Dr. Donne Hazel  DVT Prophylaxis  :  [place] HEPARIN  Lab Results  Component Value Date   PLT 142 (L) 10/03/2017    Antibiotics  :    Anti-infectives (From admission, onward)   Start     Dose/Rate Route Frequency Ordered Stop   10/03/17 1800  azithromycin (ZITHROMAX) 200 MG/5ML suspension 500 mg     500 mg Per Tube Every 24 hours 10/03/17 1241     10/01/17 1700  cefTRIAXone (ROCEPHIN) 1 g in sodium chloride 0.9 % 100 mL IVPB     1 g 200 mL/hr over 30 Minutes Intravenous Every 24 hours 10/01/17 1613     10/01/17 1700  azithromycin (ZITHROMAX) 500 mg in sodium chloride 0.9 % 250 mL IVPB  Status:  Discontinued     500 mg 250 mL/hr over 60 Minutes Intravenous Every 24 hours 10/01/17 1613 10/03/17 1241   09/30/17 0200  piperacillin-tazobactam (ZOSYN) IVPB 3.375 g  Status:  Discontinued     3.375 g 12.5 mL/hr over 240 Minutes Intravenous Every 8 hours 09/30/17 0158 09/30/17 0532        Objective:   Vitals:   10/02/17 2110 10/03/17 0436 10/03/17 0808 10/03/17 1338  BP:  (!) 142/90  137/69  Pulse:  84  79  Resp:  16  16  Temp:  98.5 F (36.9 C)  (!) 97.5 F (36.4 C)  TempSrc:  Oral  Oral  SpO2: 94% 96% 91% 96%  Weight:  68.2 kg     Height:        Wt Readings from Last 3 Encounters:  10/03/17 68.2 kg  09/28/17 69.1 kg  08/28/17 68 kg     Intake/Output Summary (Last 24 hours) at 10/03/2017 1756 Last data filed at 10/03/2017 1024 Gross per 24 hour  Intake 1168.53 ml  Output 375 ml  Net 793.53 ml     Physical Exam  Awake alert oriented x2, sitting in recliner in no apparent distress Symmetrical Chest wall movement, Good air movement bilaterally, CTAB RRR,No Gallops,Rubs or new Murmurs, No Parasternal Heave Sounds, Abd Soft, midline surgical wound covered with honey mesh,PEG+. No Cyanosis, Clubbing or edema, No new Rash or bruise      Data Review:    CBC Recent Labs  Lab 09/29/17 2217 09/30/17 0736 10/01/17 1535 10/02/17 0246 10/03/17 0423  WBC 13.9* 21.6* 15.1* 15.1* 9.3  HGB 14.4 13.2 12.6* 12.9* 12.1*  HCT 46.8 42.1 40.9 42.5 38.5*  PLT 158 145* 126* 151 142*  MCV 93.8 92.5 94.7 95.1 94.6  MCH 28.9 29.0 29.2 28.9 29.7  MCHC 30.8 31.4 30.8 30.4 31.4  RDW 14.6 14.7 14.8 14.7 14.7  LYMPHSABS  --   --  1.3  --   --   MONOABS  --   --  3.4*  --   --  EOSABS  --   --  0.3  --   --   BASOSABS  --   --  0.1  --   --     Chemistries  Recent Labs  Lab 09/29/17 2217 09/30/17 0736 10/02/17 0246 10/03/17 0423  NA 139 138 142 144  K 4.4 4.3 4.2 4.8  CL 98 100 108 111  CO2 31 29 27 23   GLUCOSE 139* 158* 95 107*  BUN 29* 29* 25* 23  CREATININE 1.14 1.11 0.88 0.77  CALCIUM 9.5 8.6* 8.5* 8.5*  MG  --   --   --  1.8  AST 20  --   --   --   ALT 9  --   --   --   ALKPHOS 76  --   --   --   BILITOT 1.5*  --   --   --    ------------------------------------------------------------------------------------------------------------------ No results for input(s): CHOL, HDL, LDLCALC, TRIG, CHOLHDL, LDLDIRECT in the last 72 hours.  Lab Results  Component Value Date   HGBA1C 5.6 08/18/2011    ------------------------------------------------------------------------------------------------------------------ No results for input(s): TSH, T4TOTAL, T3FREE, THYROIDAB in the last 72 hours.  Invalid input(s): FREET3 ------------------------------------------------------------------------------------------------------------------ No results for input(s): VITAMINB12, FOLATE, FERRITIN, TIBC, IRON, RETICCTPCT in the last 72 hours.  Coagulation profile No results for input(s): INR, PROTIME in the last 168 hours.  No results for input(s): DDIMER in the last 72 hours.  Cardiac Enzymes No results for input(s): CKMB, TROPONINI, MYOGLOBIN in the last 168 hours.  Invalid input(s): CK ------------------------------------------------------------------------------------------------------------------    Component Value Date/Time   BNP 241.6 (H) 04/07/2017 1042    Inpatient Medications  Scheduled Meds: . azithromycin  500 mg Per Tube Q24H  . chlorhexidine  15 mL Mouth Rinse BID  . digoxin  0.125 mg Oral Daily  . famotidine  20 mg Oral BID  . feeding supplement (VITAL AF 1.2 CAL)  1,500 mL Per Tube Q24H  . heparin  5,000 Units Subcutaneous Q8H  . ipratropium  1 spray Each Nare TID AC  . mouth rinse  15 mL Mouth Rinse q12n4p  . metoprolol tartrate  25 mg Oral BID  . mometasone-formoterol  2 puff Inhalation BID  . tiotropium  18 mcg Inhalation Daily   Continuous Infusions: . sodium chloride 75 mL/hr at 10/03/17 0729  . cefTRIAXone (ROCEPHIN)  IV 1 g (10/02/17 1700)   PRN Meds:.ipratropium-albuterol, lip balm, morphine injection, ondansetron **OR** ondansetron (ZOFRAN) IV  Micro Results Recent Results (from the past 240 hour(s))  MRSA PCR Screening     Status: None   Collection Time: 09/30/17  5:33 AM  Result Value Ref Range Status   MRSA by PCR NEGATIVE NEGATIVE Final    Comment:        The GeneXpert MRSA Assay (FDA approved for NASAL specimens only), is one component of  a comprehensive MRSA colonization surveillance program. It is not intended to diagnose MRSA infection nor to guide or monitor treatment for MRSA infections. Performed at [facility], [place] [address], [place], [place] [address]     Radiology Reports Dg Abd 1 View  Result Date: 09/30/2017 CLINICAL DATA:  Encounter for nasogastric tube placement EXAM: ABDOMEN - 1 VIEW COMPARISON:  CT from earlier today FINDINGS: Extensive small bowel pneumatosis on the right, known from prior CT. An orogastric tube overlaps the stomach. There is also a percutaneous gastrostomy tube. Sequela of prior cardiac surgery. Detection of pneumoperitoneum is limited in the supine position. There is  lower lobe atelectasis. IMPRESSION: 1. New nasogastric tube in good position. 2. Extensive pneumatosis of small bowel loops. Electronically Signed   By: Monte Fantasia M.D.   On: 09/30/2017 08:19   Ct Abdomen Pelvis W Contrast  Result Date: 09/30/2017 CLINICAL DATA:  Coffee ground emesis. Generalized abdominal pain. History of PEG tube, cholecystectomy, diverticulosis, rectal ulcer. EXAM: CT ABDOMEN AND PELVIS WITH CONTRAST TECHNIQUE: Multidetector CT imaging of the abdomen and pelvis was performed using the standard protocol following bolus administration of intravenous contrast. CONTRAST:  <See Chart> ISOVUE-300 IOPAMIDOL (ISOVUE-300) INJECTION 61% COMPARISON:  CT abdomen and pelvis March 26, 2017 FINDINGS: LOWER CHEST: New lingular consolidation with ground-glass opacities. Similar dense rounded consolidation RIGHT lower lobe favoring round pneumonia. New subpulmonic RIGHT pleural effusion. HEPATOBILIARY: Status post cholecystectomy.  Normal liver. PANCREAS: Normal. SPLEEN: Calcified scarring, normal splenic size. ADRENALS/URINARY TRACT: Kidneys are orthotopic, demonstrating symmetric enhancement. 4 mm LEFT lower pole nephrolithiasis. 1 cm cyst lower pole RIGHT kidney. She too small to characterize hypodensities  bilateral kidneys. LEFT interpolar scarring. No hydronephrosis or solid renal masses. The unopacified ureters are normal in course and caliber. Delayed imaging through the kidneys demonstrates symmetric prompt contrast excretion within the proximal urinary collecting system. Urinary bladder is partially distended and unremarkable. Normal adrenal glands. STOMACH/BOWEL: Small bowel dilated to 3.7 cm. Severe small bowel pneumatosis. Mild colonic diverticulosis. Minimal likely intravenous gas about the descending colon and small bowel. Decompressed colon with air-fluid levels. Intraluminal gastrostomy tube with mild fluid distended stomach. VASCULAR/LYMPHATIC: Aortoiliac vessels are normal in course and caliber. Mild calcific atherosclerosis. No lymphadenopathy by CT size criteria. REPRODUCTIVE: Normal. OTHER: Small volume low-density ascites.  No abscess. MUSCULOSKELETAL: Nonacute. Severe degenerative changes of the thoracolumbar spine. Severe RIGHT hip osteoarthrosis. Osteopenia. Status post median sternotomy. IMPRESSION: 1. Severe small-bowel pneumatosis intestinalis with mesenteric venous gas. Resultant small bowel obstruction versus ileus. 2. Small amount of ascites. No abscess. 3. New lingular pneumonia. Stable rounded density RIGHT lung base most compatible with round pneumonia/scarring. New small RIGHT pleural effusion. 4. Critical Value/emergent results were called by telephone at the time of interpretation on 09/30/2017 at 12:57 am to [facility] , who verbally acknowledged these results. Aortic Atherosclerosis (ICD10-I70.0). Electronically Signed   By: Elon Alas M.D.   On: 09/30/2017 01:00   Dg Chest Port 1 View  Result Date: 10/02/2017 CLINICAL DATA:  SOB (shortness of breath) EXAM: PORTABLE CHEST 1 VIEW COMPARISON:  10/01/2017 and CT chest 03/26/2017 FINDINGS: The patient has a LEFT-sided transvenous pacemaker with lead to the RIGHT ventricle. The heart size is mildly enlarged. There are  small bilateral pleural effusions. Opacity at the MEDIAL LEFT lung base obscures the hemidiaphragm and is consistent with atelectasis or infiltrate. No pulmonary edema. IMPRESSION: LEFT LOWER lobe atelectasis or infiltrate.  Small effusions. Electronically Signed   By: [name] M.D.   On: 10/02/2017 08:38   Dg Chest Port 1 View  Addendum Date: 10/01/2017   ADDENDUM REPORT: 10/01/2017 18:24 ADDENDUM: These results were called by telephone at the time of interpretation on 10/01/2017 at 6:24 pm to Dr. Rolm Bookbinder , who verbally acknowledged these results. Electronically Signed   By: Ashley Royalty M.D.   On: 10/01/2017 18:24   Result Date: 10/01/2017 CLINICAL DATA:  Encounter for brain damage due to decreased oxygen supply. EXAM: PORTABLE CHEST 1 VIEW COMPARISON:  05/22/2017 and 04/09/2017 CXR FINDINGS: 6 mm thick hyper lucency along the periphery of the right mid lung cannot exclude a small pneumothorax. No mediastinal shift is  identified. The trachea is midline. No acute osseous appearing abnormality is identified. No fracture is seen. There is osteoarthritis of the glenohumeral joints bilaterally. Stable cardiomegaly with tortuous atherosclerotic nonaneurysmal appearing thoracic aorta. Hazy densities at the left lung base are noted which may reflect atelectasis or pneumonia. Chronic minimal blunting the right lateral costophrenic angle with probable small left effusion. IMPRESSION: 1. 6 mm thick hyperlucency along the periphery of the right mid lung may reflect a small pneumothorax, less than 5%. 2. Stable cardiomegaly with aortic atherosclerosis left basilar atelectasis. Probable small left effusion and chronic minimal blunting of the right lateral costophrenic angle which may reflect pleural thickening or chronic trace effusion. Electronically Signed: By: Ashley Royalty M.D. On: 10/01/2017 18:07     Phillips Climes M.D on 10/03/2017 at 5:56 PM  Between 7am to 7pm - Pager - [phone]  After 7pm  go to [email or web address] - password [facility]  [facility] -  Office  [phone]

## 2017-10-04 DIAGNOSIS — E43 Unspecified severe protein-calorie malnutrition: Secondary | ICD-10-CM

## 2017-10-04 DIAGNOSIS — I482 Chronic atrial fibrillation: Secondary | ICD-10-CM

## 2017-10-04 DIAGNOSIS — R1310 Dysphagia, unspecified: Secondary | ICD-10-CM

## 2017-10-04 LAB — CBC
HEMATOCRIT: 38.2 % — AB (ref 39.0–52.0)
HEMOGLOBIN: 11.9 g/dL — AB (ref 13.0–17.0)
MCH: 28.7 pg (ref 26.0–34.0)
MCHC: 31.2 g/dL (ref 30.0–36.0)
MCV: 92.3 fL (ref 78.0–100.0)
Platelets: 149 10*3/uL — ABNORMAL LOW (ref 150–400)
RBC: 4.14 MIL/uL — AB (ref 4.22–5.81)
RDW: 14.8 % (ref 11.5–15.5)
WBC: 8.2 10*3/uL (ref 4.0–10.5)

## 2017-10-04 LAB — GLUCOSE, CAPILLARY
GLUCOSE-CAPILLARY: 113 mg/dL — AB (ref 70–99)
GLUCOSE-CAPILLARY: 119 mg/dL — AB (ref 70–99)
GLUCOSE-CAPILLARY: 126 mg/dL — AB (ref 70–99)
Glucose-Capillary: 115 mg/dL — ABNORMAL HIGH (ref 70–99)
Glucose-Capillary: 125 mg/dL — ABNORMAL HIGH (ref 70–99)
Glucose-Capillary: 128 mg/dL — ABNORMAL HIGH (ref 70–99)

## 2017-10-04 LAB — BASIC METABOLIC PANEL
Anion gap: 8 (ref 5–15)
BUN: 18 mg/dL (ref 8–23)
CO2: 21 mmol/L — ABNORMAL LOW (ref 22–32)
Calcium: 8.7 mg/dL — ABNORMAL LOW (ref 8.9–10.3)
Chloride: 113 mmol/L — ABNORMAL HIGH (ref 98–111)
Creatinine, Ser: 0.71 mg/dL (ref 0.61–1.24)
GFR calc non Af Amer: >60 mL/min (ref >60)
Glucose, Bld: 125 mg/dL — ABNORMAL HIGH (ref 70–99)
POTASSIUM: 3.4 mmol/L — AB (ref 3.5–5.1)
SODIUM: 142 mmol/L (ref 135–145)

## 2017-10-04 LAB — MAGNESIUM: MAGNESIUM: 1.4 mg/dL — AB (ref 1.7–2.4)

## 2017-10-04 MED ORDER — POTASSIUM CHLORIDE 20 MEQ PO PACK
20.0000 meq | PACK | Freq: Two times a day (BID) | ORAL | Status: AC
  Administered 2017-10-04 – 2017-10-05 (×3): 20 meq
  Filled 2017-10-04 (×3): qty 1

## 2017-10-04 MED ORDER — DIGOXIN 125 MCG PO TABS
0.1250 mg | ORAL_TABLET | Freq: Every day | ORAL | Status: DC
  Administered 2017-10-05 – 2017-10-06 (×2): 0.125 mg
  Filled 2017-10-04 (×2): qty 1

## 2017-10-04 MED ORDER — VITAL AF 1.2 CAL PO LIQD
1500.0000 mL | ORAL | Status: DC
  Administered 2017-10-04 – 2017-10-05 (×3): 1500 mL
  Filled 2017-10-04 (×2): qty 1500

## 2017-10-04 MED ORDER — ACETAMINOPHEN 160 MG/5ML PO SOLN
650.0000 mg | Freq: Four times a day (QID) | ORAL | Status: DC
  Administered 2017-10-04 – 2017-10-06 (×9): 650 mg
  Filled 2017-10-04 (×9): qty 20.3

## 2017-10-04 MED ORDER — METOPROLOL TARTRATE 25 MG PO TABS
25.0000 mg | ORAL_TABLET | Freq: Two times a day (BID) | ORAL | Status: DC
  Administered 2017-10-04 – 2017-10-06 (×4): 25 mg
  Filled 2017-10-04 (×4): qty 1

## 2017-10-04 MED ORDER — OXYCODONE HCL 20 MG/ML PO CONC: 5.0000 mg | ORAL | Status: DC | PRN

## 2017-10-04 MED ORDER — OXYCODONE HCL 5 MG/5ML PO SOLN
5.0000 mg | ORAL | Status: DC | PRN
  Administered 2017-10-06: 5 mg
  Filled 2017-10-04 (×2): qty 5

## 2017-10-04 MED ORDER — FAMOTIDINE 40 MG/5ML PO SUSR
20.0000 mg | Freq: Two times a day (BID) | ORAL | Status: DC
  Administered 2017-10-04 – 2017-10-06 (×4): 20 mg
  Filled 2017-10-04 (×4): qty 2.5

## 2017-10-04 MED ORDER — MAGNESIUM SULFATE 2 GM/50ML IV SOLN
2.0000 g | Freq: Once | INTRAVENOUS | Status: AC
  Administered 2017-10-04: 2 g via INTRAVENOUS
  Filled 2017-10-04: qty 50

## 2017-10-04 MED ORDER — ACETAMINOPHEN 160 MG/5ML PO SOLN: 650.0000 mg | Freq: Four times a day (QID) | ORAL | Status: DC

## 2017-10-04 NOTE — Clinical Social Work Note (Signed)
Clinical Social Work Assessment  Patient Details  Name: [name] MRN: [id number] Date of Birth: 02/14/1932  Date of referral:  10/04/17               Reason for consult:  Facility Placement, Discharge Planning                Permission sought to share information with:  Facility Sport and exercise psychologist, Family Supports Permission granted to share information::  Yes, Verbal Permission Granted  Name::     [name]  Agency::  [employer]  Relationship::  daughter  Contact Information:  [phone]  Housing/Transportation Living arrangements for the past 2 months:  [place] of Information:  Patient Patient Interpreter Needed:  None Criminal Activity/Legal Involvement Pertinent to Current Situation/Hospitalization:  No - Comment as needed Significant Relationships:  Adult Children, Other Family Members Lives with:  Self, Facility Resident Do you feel safe going back to the place where you live?  Yes Need for family participation in patient care:  Yes (Comment)  Care giving concerns:  Pt LTC at SNF, receives tube feeding and is assisted by staff there; pt states he has a private room and is okay with return to SNF but only when he is ready as "I get good service here." Pt remains inpatient for continued surgical work up.    Social Worker assessment / plan: CSW spoke with pt at bedside; pt from [place]. Pt originally from [place]. [place] but has lived in [place] "long enough to call it home." Pt has two children; a daughter in [place] and a son whom he could not state where he lives. Pt states his daughter comes to visit him at [facility] when able. Pt states he is comfortable here but okay with return to LTC at [facility] when discharged. CSW remains available to assist with discharge when medically appropriate.   Employment status:  Retired Forensic scientist:  Information systems manager, Medicaid [place] PT Recommendations:  [place], [place] / Referral to community resources:  [place]  Patient/Family's Response to care:  Pt understands CSW role and that he will return to SNF when medically appropriate.   Patient/Family's Understanding of and Emotional Response to Diagnosis, Current Treatment, and Prognosis:  Pt states awareness and understanding of diagnosis, current treatment and prognosis. Pt expresses gratitude for care and treatment here at hospital. Pt amenable to return to SNF when medically appropriate.   Emotional Assessment Appearance:  Appears stated age Attitude/Demeanor/Rapport:  Self-Absorbed, Gracious Affect (typically observed):  Appropriate, Pleasant Orientation:  Oriented to Self, Oriented to Place, Oriented to Situation Alcohol / Substance use:  Not Applicable Psych involvement (Current and /or in the community):  No (Comment)  Discharge Needs  Concerns to be addressed:  Care Coordination Readmission within the last 30 days:  No Current discharge risk:  Dependent with Mobility, Chronically ill Barriers to Discharge:  Continued Medical Work up   [employer], [place] 10/04/2017, 10:19 AM

## 2017-10-04 NOTE — Progress Notes (Signed)
[facility] Progress Note  4 Days Post-Op  Subjective: CC: abdominal pain Patient reports soreness in lower abdomen. Denies nausea. Having large loose stools.   Objective: Vital signs in last 24 hours: Temp:  [97.5 F (36.4 C)-98.4 F (36.9 C)] 98 F (36.7 C) (09/04 0415) Pulse Rate:  [75-79] 75 (09/04 0415) Resp:  [16] 16 (09/04 0415) BP: (131-137)/(69-83) 131/73 (09/04 0415) SpO2:  [95 %-99 %] 96 % (09/04 0801) Last BM Date: (PTA)  Intake/Output from previous day: 09/03 0701 - 09/04 0700 In: -  Out: 100 [Urine:100] Intake/Output this shift: No intake/output data recorded.  PE: Gen:  Alert, NAD, pleasant Card:  Regular rate and rhythm, pedal pulses 2+ BL Pulm:  Normal effort, clear to auscultation bilaterally Abd: Soft, appropriately tender, non-distended, bowel sounds present, no HSM, incision C/D/I with staples present, PEG tube in place Skin: warm and dry, no rashes  Psych: A&Ox3   Lab Results:  Recent Labs    10/03/17 0423 10/04/17 0551  WBC 9.3 8.2  HGB 12.1* 11.9*  HCT 38.5* 38.2*  PLT 142* 149*   BMET Recent Labs    10/03/17 0423 10/04/17 0551  NA 144 142  K 4.8 3.4*  CL 111 113*  CO2 23 21*  GLUCOSE 107* 125*  BUN 23 18  CREATININE 0.77 0.71  CALCIUM 8.5* 8.7*   PT/INR No results for input(s): LABPROT, INR in the last 72 hours. CMP     Component Value Date/Time   NA 142 10/04/2017 0551   NA 143 05/02/2017   NA 143 10/04/2012 0920   K 3.4 (L) 10/04/2017 0551   K 4.1 10/04/2012 0920   CL 113 (H) 10/04/2017 0551   CL 105 03/19/2012 1539   CO2 21 (L) 10/04/2017 0551   CO2 26 10/04/2012 0920   GLUCOSE 125 (H) 10/04/2017 0551   GLUCOSE 130 10/04/2012 0920   GLUCOSE 110 (H) 03/19/2012 1539   BUN 18 10/04/2017 0551   BUN 22 (A) 05/02/2017   BUN 24.9 10/04/2012 0920   CREATININE 0.71 10/04/2017 0551   CREATININE 1.6 (H) 10/04/2012 0920   CALCIUM 8.7 (L) 10/04/2017 0551   CALCIUM 9.1 10/04/2012 0920   PROT 7.4 09/29/2017  2217   PROT 7.1 10/04/2012 0920   ALBUMIN 4.0 09/29/2017 2217   ALBUMIN 3.2 (L) 10/04/2012 0920   AST 20 09/29/2017 2217   AST 22 10/04/2012 0920   ALT 9 09/29/2017 2217   ALT 14 10/04/2012 0920   ALKPHOS 76 09/29/2017 2217   ALKPHOS 96 10/04/2012 0920   BILITOT 1.5 (H) 09/29/2017 2217   BILITOT 0.51 10/04/2012 0920   GFRNONAA >60 10/04/2017 0551   GFRAA >60 10/04/2017 0551   Lipase     Component Value Date/Time   LIPASE 26 09/29/2017 2217       Studies/Results: No results found.  Anti-infectives: Anti-infectives (From admission, onward)   Start     Dose/Rate Route Frequency Ordered Stop   10/03/17 1800  azithromycin (ZITHROMAX) 200 MG/5ML suspension 500 mg     500 mg Per Tube Every 24 hours 10/03/17 1241     10/01/17 1700  cefTRIAXone (ROCEPHIN) 1 g in sodium chloride 0.9 % 100 mL IVPB     1 g 200 mL/hr over 30 Minutes Intravenous Every 24 hours 10/01/17 1613     10/01/17 1700  azithromycin (ZITHROMAX) 500 mg in sodium chloride 0.9 % 250 mL IVPB  Status:  Discontinued     500 mg 250 mL/hr over 60 Minutes Intravenous  Every 24 hours 10/01/17 1613 10/03/17 1241   09/30/17 0200  piperacillin-tazobactam (ZOSYN) IVPB 3.375 g  Status:  Discontinued     3.375 g 12.5 mL/hr over 240 Minutes Intravenous Every 8 hours 09/30/17 0158 09/30/17 0532       Assessment/Plan R pleural effusion/ lingular pneumonia - per [facility] - Rocephin/ Zithromax Atrial fibrillation - not anticoagulated Chronic systolic CHF/ CAD Diabetes Hypertension  Small bowel obstruction  POD #4 s/p exploratory laparotomy/ LOA - 8/31 - Dr. Donne Hazel - afebrile, VSS  - may have ice chips, advance TF  - PT/OT recommending SNF - OOB to chair, IS!!  Hypomagnesemia/Hypokalemia - replace today   FEN: ice chips, advancing TF to goal of 65 ml/h, can decrease IVF some VTE: SCDs, SQ heparin  ID: zosyn 8/31; azithromycin/ceftriaxone 9/1>> Follow up: Dr. Donne Hazel  LOS: 4 days    Brigid Re , [facility] 10/04/2017, 9:38 AM Pager: [phone] Consults: [phone] Mon-Fri 7:00 am-4:30 pm Sat-Sun 7:00 am-11:30 am

## 2017-10-04 NOTE — Progress Notes (Addendum)
PROGRESS NOTE                                                                                                                                                                                                             Patient Demographics:    [name], is a 84 y.o. male, DOB - Nov 01, 1932, JME:[id number]  Admit date - 09/29/2017   Admitting Physician Rolm Bookbinder, MD  Outpatient Primary MD for the patient is [name], MD  LOS - 4                                                                                 Consult follow up   Chief Complaint  Patient presents with  . Hematemesis  . Abdominal Pain       Brief Narrative    84 y.o. male with past medical history significant for atrial fibrillation not currently on anticoagulation but he is on digoxin congestive heart failure coronary artery disease diabetes oropharyngeal dysphagia with PEG tube in place who admitted under surgical service for small bowel obstruction, status post exploratory laparotomy with adhesion of lysis 09/30/2017, hospitalist consulted for medical management,   Subjective:   Patient continues to have some pain in his abdomen.  Some nausea but no vomiting.  Has been passing gas.  Tells me that he had a bowel movement yesterday.   Assessment  & Plan :    Active Problems:   Small bowel ischemia (HCC)   Protein-calorie malnutrition, severe   Small bowel obstruction -Management per primary surgical team.   Right lung base pneumonia -He presented with leukocytosis, CT with evidence of right lung pneumonia.  Due to his presenting symptoms of nausea and vomiting there was concern for aspiration.  Patient currently is on IV antibiotics which will be continued.  He is currently on ceftriaxone and azithromycin.  Was previously on Zosyn.    Chronic dysphagia -He has PEG tube.  Currently on trickle feeds.  Chronic A. Fib Stable.  Rate controlled on metoprolol  and  digoxin.  Not on anticoagulation due to history of GI bleed.    Polyneuropathy Resume gabapentin when able to take orally.  Chronic systolic heart failure/EF 35%/status post AICD Seems well compensated at this time.  Continue to watch volume status closely.  Continue with his current medications.  Holding Lasix and lisinopril.    Normocytic anemia No evidence for overt bleeding.  Continue to watch closely.  Code Status : Full code  Procedures  : Post exploratory laparotomy, lysis of adhesion on 09/30/2017 by Dr. Donne Hazel  DVT Prophylaxis  :  [place] HEPARIN  Lab Results  Component Value Date   PLT 149 (L) 10/04/2017    Antibiotics  :    Anti-infectives (From admission, onward)   Start     Dose/Rate Route Frequency Ordered Stop   10/03/17 1800  azithromycin (ZITHROMAX) 200 MG/5ML suspension 500 mg     500 mg Per Tube Every 24 hours 10/03/17 1241     10/01/17 1700  cefTRIAXone (ROCEPHIN) 1 g in sodium chloride 0.9 % 100 mL IVPB     1 g 200 mL/hr over 30 Minutes Intravenous Every 24 hours 10/01/17 1613     10/01/17 1700  azithromycin (ZITHROMAX) 500 mg in sodium chloride 0.9 % 250 mL IVPB  Status:  Discontinued     500 mg 250 mL/hr over 60 Minutes Intravenous Every 24 hours 10/01/17 1613 10/03/17 1241   09/30/17 0200  piperacillin-tazobactam (ZOSYN) IVPB 3.375 g  Status:  Discontinued     3.375 g 12.5 mL/hr over 240 Minutes Intravenous Every 8 hours 09/30/17 0158 09/30/17 0532        Objective:   Vitals:   10/03/17 2114 10/04/17 0415 10/04/17 0800 10/04/17 0801  BP: 131/83 131/73    Pulse: 79 75    Resp:  16    Temp:  98 F (36.7 C)    TempSrc:  Oral    SpO2:  95% 95% 96%  Weight:      Height:        Wt Readings from Last 3 Encounters:  10/03/17 68.2 kg  09/28/17 69.1 kg  08/28/17 68 kg    No intake or output data in the 24 hours ending 10/04/17 1107   Physical Exam  Awake alert.  In no distress With normal effort.  Clear to auscultation  bilaterally S1-S2 is normal regular Abdomen soft.  PEG tube is present.     Data Review:    CBC Recent Labs  Lab 09/30/17 0736 10/01/17 1535 10/02/17 0246 10/03/17 0423 10/04/17 0551  WBC 21.6* 15.1* 15.1* 9.3 8.2  HGB 13.2 12.6* 12.9* 12.1* 11.9*  HCT 42.1 40.9 42.5 38.5* 38.2*  PLT 145* 126* 151 142* 149*  MCV 92.5 94.7 95.1 94.6 92.3  MCH 29.0 29.2 28.9 29.7 28.7  MCHC 31.4 30.8 30.4 31.4 31.2  RDW 14.7 14.8 14.7 14.7 14.8  LYMPHSABS  --  1.3  --   --   --   MONOABS  --  3.4*  --   --   --   EOSABS  --  0.3  --   --   --   BASOSABS  --  0.1  --   --   --     Chemistries  Recent Labs  Lab 09/29/17 2217 09/30/17 0736 10/02/17 0246 10/03/17 0423 10/04/17 0551  NA 139 138 142 144 142  K 4.4 4.3 4.2 4.8 3.4*  CL 98 100 108 111 113*  CO2 31 29 27 23  21*  GLUCOSE  139* 158* 95 107* 125*  BUN 29* 29* 25* 23 18  CREATININE 1.14 1.11 0.88 0.77 0.71  CALCIUM 9.5 8.6* 8.5* 8.5* 8.7*  MG  --   --   --  1.8 1.4*  AST 20  --   --   --   --   ALT 9  --   --   --   --   ALKPHOS 76  --   --   --   --   BILITOT 1.5*  --   --   --   --     Inpatient Medications  Scheduled Meds: . acetaminophen (TYLENOL) oral liquid 160 mg/5 mL  650 mg Per Tube Q6H  . azithromycin  500 mg Per Tube Q24H  . chlorhexidine  15 mL Mouth Rinse BID  . digoxin  0.125 mg Oral Daily  . famotidine  20 mg Oral BID  . feeding supplement (VITAL AF 1.2 CAL)  1,500 mL Per Tube Q24H  . heparin  5,000 Units Subcutaneous Q8H  . ipratropium  1 spray Each Nare TID AC  . mouth rinse  15 mL Mouth Rinse q12n4p  . metoprolol tartrate  25 mg Oral BID  . mometasone-formoterol  2 puff Inhalation BID  . potassium chloride  20 mEq Per Tube BID  . tiotropium  18 mcg Inhalation Daily   Continuous Infusions: . sodium chloride 50 mL/hr at 10/04/17 1105  . cefTRIAXone (ROCEPHIN)  IV 1 g (10/03/17 1843)  . magnesium sulfate 1 - 4 g bolus IVPB 2 g (10/04/17 1106)   PRN Meds:.ipratropium-albuterol, lip balm,  morphine injection, ondansetron **OR** ondansetron (ZOFRAN) IV, oxyCODONE  Micro Results Recent Results (from the past 240 hour(s))  MRSA PCR Screening     Status: None   Collection Time: 09/30/17  5:33 AM  Result Value Ref Range Status   MRSA by PCR NEGATIVE NEGATIVE Final    Comment:        The GeneXpert MRSA Assay (FDA approved for NASAL specimens only), is one component of a comprehensive MRSA colonization surveillance program. It is not intended to diagnose MRSA infection nor to guide or monitor treatment for MRSA infections. Performed at [facility], [place] [address] [place], [place] [address]     Radiology Reports Dg Abd 1 View  Result Date: 09/30/2017 CLINICAL DATA:  Encounter for nasogastric tube placement EXAM: ABDOMEN - 1 VIEW COMPARISON:  CT from earlier today FINDINGS: Extensive small bowel pneumatosis on the right, known from prior CT. An orogastric tube overlaps the stomach. There is also a percutaneous gastrostomy tube. Sequela of prior cardiac surgery. Detection of pneumoperitoneum is limited in the supine position. There is lower lobe atelectasis. IMPRESSION: 1. New nasogastric tube in good position. 2. Extensive pneumatosis of small bowel loops. Electronically Signed   By: Monte Fantasia M.D.   On: 09/30/2017 08:19   Ct Abdomen Pelvis W Contrast  Result Date: 09/30/2017 CLINICAL DATA:  Coffee ground emesis. Generalized abdominal pain. History of PEG tube, cholecystectomy, diverticulosis, rectal ulcer. EXAM: CT ABDOMEN AND PELVIS WITH CONTRAST TECHNIQUE: Multidetector CT imaging of the abdomen and pelvis was performed using the standard protocol following bolus administration of intravenous contrast. CONTRAST:  <See Chart> ISOVUE-300 IOPAMIDOL (ISOVUE-300) INJECTION 61% COMPARISON:  CT abdomen and pelvis March 26, 2017 FINDINGS: LOWER CHEST: New lingular consolidation with ground-glass opacities. Similar dense rounded consolidation RIGHT lower lobe favoring  round pneumonia. New subpulmonic RIGHT pleural effusion. HEPATOBILIARY: Status post cholecystectomy.  Normal liver. PANCREAS: Normal.  SPLEEN: Calcified scarring, normal splenic size. ADRENALS/URINARY TRACT: Kidneys are orthotopic, demonstrating symmetric enhancement. 4 mm LEFT lower pole nephrolithiasis. 1 cm cyst lower pole RIGHT kidney. She too small to characterize hypodensities bilateral kidneys. LEFT interpolar scarring. No hydronephrosis or solid renal masses. The unopacified ureters are normal in course and caliber. Delayed imaging through the kidneys demonstrates symmetric prompt contrast excretion within the proximal urinary collecting system. Urinary bladder is partially distended and unremarkable. Normal adrenal glands. STOMACH/BOWEL: Small bowel dilated to 3.7 cm. Severe small bowel pneumatosis. Mild colonic diverticulosis. Minimal likely intravenous gas about the descending colon and small bowel. Decompressed colon with air-fluid levels. Intraluminal gastrostomy tube with mild fluid distended stomach. VASCULAR/LYMPHATIC: Aortoiliac vessels are normal in course and caliber. Mild calcific atherosclerosis. No lymphadenopathy by CT size criteria. REPRODUCTIVE: Normal. OTHER: Small volume low-density ascites.  No abscess. MUSCULOSKELETAL: Nonacute. Severe degenerative changes of the thoracolumbar spine. Severe RIGHT hip osteoarthrosis. Osteopenia. Status post median sternotomy. IMPRESSION: 1. Severe small-bowel pneumatosis intestinalis with mesenteric venous gas. Resultant small bowel obstruction versus ileus. 2. Small amount of ascites. No abscess. 3. New lingular pneumonia. Stable rounded density RIGHT lung base most compatible with round pneumonia/scarring. New small RIGHT pleural effusion. 4. Critical Value/emergent results were called by telephone at the time of interpretation on 09/30/2017 at 12:57 am to [facility] , who verbally acknowledged these results. Aortic Atherosclerosis (ICD10-I70.0).  Electronically Signed   By: Elon Alas M.D.   On: 09/30/2017 01:00   Dg Chest Port 1 View  Result Date: 10/02/2017 CLINICAL DATA:  SOB (shortness of breath) EXAM: PORTABLE CHEST 1 VIEW COMPARISON:  10/01/2017 and CT chest 03/26/2017 FINDINGS: The patient has a LEFT-sided transvenous pacemaker with lead to the RIGHT ventricle. The heart size is mildly enlarged. There are small bilateral pleural effusions. Opacity at the MEDIAL LEFT lung base obscures the hemidiaphragm and is consistent with atelectasis or infiltrate. No pulmonary edema. IMPRESSION: LEFT LOWER lobe atelectasis or infiltrate.  Small effusions. Electronically Signed   By: [name] M.D.   On: 10/02/2017 08:38   Dg Chest Port 1 View  Addendum Date: 10/01/2017   ADDENDUM REPORT: 10/01/2017 18:24 ADDENDUM: These results were called by telephone at the time of interpretation on 10/01/2017 at 6:24 pm to Dr. Rolm Bookbinder , who verbally acknowledged these results. Electronically Signed   By: Ashley Royalty M.D.   On: 10/01/2017 18:24   Result Date: 10/01/2017 CLINICAL DATA:  Encounter for brain damage due to decreased oxygen supply. EXAM: PORTABLE CHEST 1 VIEW COMPARISON:  05/22/2017 and 04/09/2017 CXR FINDINGS: 6 mm thick hyper lucency along the periphery of the right mid lung cannot exclude a small pneumothorax. No mediastinal shift is identified. The trachea is midline. No acute osseous appearing abnormality is identified. No fracture is seen. There is osteoarthritis of the glenohumeral joints bilaterally. Stable cardiomegaly with tortuous atherosclerotic nonaneurysmal appearing thoracic aorta. Hazy densities at the left lung base are noted which may reflect atelectasis or pneumonia. Chronic minimal blunting the right lateral costophrenic angle with probable small left effusion. IMPRESSION: 1. 6 mm thick hyperlucency along the periphery of the right mid lung may reflect a small pneumothorax, less than 5%. 2. Stable cardiomegaly with  aortic atherosclerosis left basilar atelectasis. Probable small left effusion and chronic minimal blunting of the right lateral costophrenic angle which may reflect pleural thickening or chronic trace effusion. Electronically Signed: By: Ashley Royalty M.D. On: 10/01/2017 18:07     Bonnielee Haff M.D on 10/04/2017 at 11:07 AM  Between  7am to 7pm - Pager - [phone]  After 7pm go to [email or web address] - password [email or web address]  [facility] -  Office  [phone]

## 2017-10-04 NOTE — Care Management Note (Signed)
Case Management Note  Patient Details  Name: [name] MRN: [id number] Date of Birth: January 26, 1933  Subjective/Objective:                 Small bowel ischemia.   Action/Plan:  Patient admitted from [facility]. Monia Pouch CSW following for medical stability to return at DC. No CM needs identified at this time.   Expected Discharge Date:  10/07/17               Expected Discharge Plan:  Skilled Nursing Facility  In-House Referral:  Clinical Social Work  Discharge planning Services     Post Acute Care Choice:    Choice offered to:     DME Arranged:    DME Agency:     HH Arranged:    [place] Agency:     Status of Service:  Completed, signed off  If discussed at [employer], dates discussed:    Additional Comments:  Carles Collet, RN 10/04/2017, 10:36 AM

## 2017-10-04 NOTE — Discharge Instructions (Signed)
[facility] [phone]  OPEN ABDOMINAL SURGERY: POST OP INSTRUCTIONS  Always review your discharge instruction sheet given to you by the facility where your surgery was performed.  IF YOU HAVE DISABILITY OR FAMILY LEAVE FORMS, YOU MUST BRING THEM TO THE OFFICE FOR PROCESSING.  PLEASE DO NOT GIVE THEM TO YOUR DOCTOR.  1. A prescription for pain medication may be given to you upon discharge.  Take your pain medication as prescribed, if needed.  If narcotic pain medicine is not needed, then you may take acetaminophen (Tylenol) or ibuprofen (Advil) as needed. 2. Take your usually prescribed medications unless otherwise directed. 3. If you need a refill on your pain medication, please contact your pharmacy. They will contact our office to request authorization.  Prescriptions will not be filled after 5pm or on week-ends. 4. You should follow a light diet the first few days after arrival home, such as soup and crackers, pudding, etc.unless your doctor has advised otherwise. A high-fiber, low fat diet can be resumed as tolerated.   Be sure to include lots of fluids daily. Most patients will experience some swelling and bruising on the chest and neck area.  Ice packs will help.  Swelling and bruising can take several days to resolve 5. Most patients will experience some swelling and bruising in the area of the incision. Ice pack will help. Swelling and bruising can take several days to resolve..  6. It is common to experience some constipation if taking pain medication after surgery.  Increasing fluid intake and taking a stool softener will usually help or prevent this problem from occurring.  A mild laxative (Milk of Magnesia or Miralax) should be taken according to package directions if there are no bowel movements after 48 hours. 7.  You may have steri-strips (small skin tapes) in place directly over the incision.  These strips should be left on the skin for 7-10 days.  If your  surgeon used skin glue on the incision, you may shower in 24 hours.  The glue will flake off over the next 2-3 weeks.  Any sutures or staples will be removed at the office during your follow-up visit. You may find that a light gauze bandage over your incision may keep your staples from being rubbed or pulled. You may shower and replace the bandage daily. 8. ACTIVITIES:  You may resume regular (light) daily activities beginning the next day--such as daily self-care, walking, climbing stairs--gradually increasing activities as tolerated.  You may have sexual intercourse when it is comfortable.  Refrain from any heavy lifting or straining until approved by your doctor. a. You may drive when you no longer are taking prescription pain medication, you can comfortably wear a seatbelt, and you can safely maneuver your car and apply brakes  9. You should see your doctor in the office for a follow-up appointment approximately two weeks after your surgery.  Make sure that you call for this appointment within a day or two after you arrive home to insure a convenient appointment time.  WHEN TO CALL YOUR DOCTOR: 1. Fever over 101.0 2. Inability to urinate 3. Nausea and/or vomiting 4. Extreme swelling or bruising 5. Continued bleeding from incision. 6. Increased pain, redness, or drainage from the incision. 7. Difficulty swallowing or breathing 8. Muscle cramping or spasms. 9. Numbness or tingling in hands or feet or around lips.  The clinic staff is available to answer your questions during regular business hours.  Please   don't hesitate to call and ask to speak to one of the nurses if you have concerns.  For further questions, please visit [email or web address]   

## 2017-10-05 DIAGNOSIS — J690 Pneumonitis due to inhalation of food and vomit: Secondary | ICD-10-CM

## 2017-10-05 DIAGNOSIS — D649 Anemia, unspecified: Secondary | ICD-10-CM

## 2017-10-05 LAB — GLUCOSE, CAPILLARY
GLUCOSE-CAPILLARY: 101 mg/dL — AB (ref 70–99)
GLUCOSE-CAPILLARY: 112 mg/dL — AB (ref 70–99)
Glucose-Capillary: 119 mg/dL — ABNORMAL HIGH (ref 70–99)
Glucose-Capillary: 108 mg/dL — ABNORMAL HIGH (ref 70–99)
Glucose-Capillary: 92 mg/dL (ref 70–99)
Glucose-Capillary: 144 mg/dL — ABNORMAL HIGH (ref 70–99)

## 2017-10-05 LAB — BASIC METABOLIC PANEL
ANION GAP: 7 (ref 5–15)
BUN: 18 mg/dL (ref 8–23)
CO2: 24 mmol/L (ref 22–32)
CREATININE: 0.7 mg/dL (ref 0.61–1.24)
Calcium: 8.4 mg/dL — ABNORMAL LOW (ref 8.9–10.3)
Chloride: 113 mmol/L — ABNORMAL HIGH (ref 98–111)
GFR calc Af Amer: >60 mL/min (ref >60)
GLUCOSE: 109 mg/dL — AB (ref 70–99)
Potassium: 4 mmol/L (ref 3.5–5.1)
Sodium: 144 mmol/L (ref 135–145)

## 2017-10-05 LAB — MAGNESIUM: Magnesium: 1.9 mg/dL (ref 1.7–2.4)

## 2017-10-05 MED ORDER — JEVITY 1.2 CAL PO LIQD: 1000.0000 mL | ORAL | Status: DC

## 2017-10-05 MED ORDER — OSMOLITE 1.5 CAL PO LIQD
240.0000 mL | Freq: Every day | ORAL | Status: DC
  Administered 2017-10-05 (×2): 237 mL
  Administered 2017-10-06: 240 mL
  Administered 2017-10-06 (×2): 237 mL
  Filled 2017-10-05 (×8): qty 474

## 2017-10-05 NOTE — Progress Notes (Signed)
[facility] Progress Note  5 Days Post-Op  Subjective: CC:  Reports abdominal pain is improving. One episode nausea last night, now resolved. Overall feels like he cant get any rest in the hospital. Reports multiple loose bowel movements yesterday.   Objective: Vital signs in last 24 hours: Temp:  [97.4 F (36.3 C)-98.2 F (36.8 C)] 98.2 F (36.8 C) (09/05 0447) Pulse Rate:  [79-81] 79 (09/05 0447) Resp:  [16-18] 16 (09/05 0447) BP: (129-140)/(71-92) 140/71 (09/05 0447) SpO2:  [94 %-98 %] 94 % (09/05 0844) Weight:  [68.9 kg] 68.9 kg (09/05 0447) Last BM Date: (PTA)  Intake/Output from previous day: 09/04 0701 - 09/05 0700 In: 8938 [I.V.:2892; NG/GT:811; IV Piggyback:200] Out: -  Intake/Output this shift: No intake/output data recorded.  PE: Gen:  Alert, NAD, cooperative  Pulm:  Normal effort Abd: Soft, approp tender, mild distention, +BS, incision and G-tube clean and dry without leakage, TF running at 40 cc/hr. Skin: warm and dry, no rashes  Psych: A&Ox3   Lab Results:  Recent Labs    10/03/17 0423 10/04/17 0551  WBC 9.3 8.2  HGB 12.1* 11.9*  HCT 38.5* 38.2*  PLT 142* 149*   BMET Recent Labs    10/04/17 0551 10/05/17 0516  NA 142 144  K 3.4* 4.0  CL 113* 113*  CO2 21* 24  GLUCOSE 125* 109*  BUN 18 18  CREATININE 0.71 0.70  CALCIUM 8.7* 8.4*   PT/INR No results for input(s): LABPROT, INR in the last 72 hours. CMP     Component Value Date/Time   NA 144 10/05/2017 0516   NA 143 05/02/2017   NA 143 10/04/2012 0920   K 4.0 10/05/2017 0516   K 4.1 10/04/2012 0920   CL 113 (H) 10/05/2017 0516   CL 105 03/19/2012 1539   CO2 24 10/05/2017 0516   CO2 26 10/04/2012 0920   GLUCOSE 109 (H) 10/05/2017 0516   GLUCOSE 130 10/04/2012 0920   GLUCOSE 110 (H) 03/19/2012 1539   BUN 18 10/05/2017 0516   BUN 22 (A) 05/02/2017   BUN 24.9 10/04/2012 0920   CREATININE 0.70 10/05/2017 0516   CREATININE 1.6 (H) 10/04/2012 0920   CALCIUM 8.4 (L)  10/05/2017 0516   CALCIUM 9.1 10/04/2012 0920   PROT 7.4 09/29/2017 2217   PROT 7.1 10/04/2012 0920   ALBUMIN 4.0 09/29/2017 2217   ALBUMIN 3.2 (L) 10/04/2012 0920   AST 20 09/29/2017 2217   AST 22 10/04/2012 0920   ALT 9 09/29/2017 2217   ALT 14 10/04/2012 0920   ALKPHOS 76 09/29/2017 2217   ALKPHOS 96 10/04/2012 0920   BILITOT 1.5 (H) 09/29/2017 2217   BILITOT 0.51 10/04/2012 0920   GFRNONAA >60 10/05/2017 0516   GFRAA >60 10/05/2017 0516   Lipase     Component Value Date/Time   LIPASE 26 09/29/2017 2217       Studies/Results: No results found.  Anti-infectives: Anti-infectives (From admission, onward)   Start     Dose/Rate Route Frequency Ordered Stop   10/03/17 1800  azithromycin (ZITHROMAX) 200 MG/5ML suspension 500 mg     500 mg Per Tube Every 24 hours 10/03/17 1241     10/01/17 1700  cefTRIAXone (ROCEPHIN) 1 g in sodium chloride 0.9 % 100 mL IVPB     1 g 200 mL/hr over 30 Minutes Intravenous Every 24 hours 10/01/17 1613     10/01/17 1700  azithromycin (ZITHROMAX) 500 mg in sodium chloride 0.9 % 250 mL IVPB  Status:  Discontinued     500 mg 250 mL/hr over 60 Minutes Intravenous Every 24 hours 10/01/17 1613 10/03/17 1241   09/30/17 0200  piperacillin-tazobactam (ZOSYN) IVPB 3.375 g  Status:  Discontinued     3.375 g 12.5 mL/hr over 240 Minutes Intravenous Every 8 hours 09/30/17 0158 09/30/17 0532     Assessment/Plan R pleural effusion/ lingular pneumonia - per TRH - Rocephin/ Zithromax Atrial fibrillation- not anticoagulated Chronic systolic CHF/ CAD Diabetes Hypertension  Small bowel obstruction POD #5 s/p exploratory laparotomy/ LOA - 8/31 - Dr. Donne Hazel - afebrile, VSS  -bowel function returned - tolerating TF at 40 mL/hr, rather than advance to goal will transition to bolus feeds with the help of the dietician. Was on bolus feeds prior to hospital admission. - PT/OT recommending SNF - OOB to chair, IS!! Hypomagnesemia/Hypokalemia - resolved    FEN: ice chips, transition TF to bolus feeds  VTE: SCDs, SQ heparin  ID: zosyn 8/31; azithromycin/ceftriaxone 9/1>> Follow up: Dr. Donne Hazel   LOS: 5 days    Obie Dredge, [facility] Pager: [phone]

## 2017-10-05 NOTE — Plan of Care (Signed)
  Problem: Clinical Measurements: Goal: Ability to maintain clinical measurements within normal limits will improve Outcome: Progressing   Problem: Nutrition: Goal: Adequate nutrition will be maintained Outcome: Progressing   

## 2017-10-05 NOTE — Progress Notes (Signed)
PROGRESS NOTE                                                                                                                                                                                                             Patient Demographics:    [name], is a 84 y.o. male, DOB - [birth year], YYQ:[id number]  Admit date - 09/29/2017   Admitting Physician Rolm Bookbinder, MD  Outpatient Primary MD for the patient is [name], MD  LOS - 5                                                                                 Consult follow up   Chief Complaint  Patient presents with  . Hematemesis  . Abdominal Pain       Brief Narrative    84 y.o. male with past medical history significant for atrial fibrillation not currently on anticoagulation but he is on digoxin congestive heart failure coronary artery disease diabetes oropharyngeal dysphagia with PEG tube in place who admitted under surgical service for small bowel obstruction, status post exploratory laparotomy with adhesion of lysis 09/30/2017, hospitalist consulted for medical management,   Subjective:   Patient continues to have loose stools.  Denies any nausea or vomiting.  Unable to sleep well at night.     Assessment  & Plan :    Active Problems:   Small bowel ischemia (HCC)   Protein-calorie malnutrition, severe   Small bowel obstruction Management per general surgery  Right lung base pneumonia He presented with leukocytosis, CT with evidence of right lung pneumonia.  Due to his presenting symptoms of nausea and vomiting there was concern for aspiration.  Patient currently is on IV antibiotics which will be continued.  He is currently on ceftriaxone and azithromycin.  Was previously on Zosyn.  Plan on a total of 7 days of antibiotics.  End date will be 10/07/2017.  Chronic dysphagia -He has PEG tube.  Feeds per general surgery.  Chronic A. Fib Stable.  Rate controlled on  metoprolol and digoxin.  Not on anticoagulation due to history of GI bleed.    Polyneuropathy Was supposedly on gabapentin at home.  Not seen on his home medication list.  Chronic systolic heart failure/EF 35%/status post AICD Seems well compensated at this time.  Continue to watch volume status closely.  Continue with his current medications.  Holding Lasix and lisinopril.    Normocytic anemia No evidence for overt bleeding.  Hemoglobin has been stable.  Code Status : Full code  Procedures  : Post exploratory laparotomy, lysis of adhesion on 09/30/2017 by Dr. Donne Hazel  Antibiotics  :    Anti-infectives (From admission, onward)   Start     Dose/Rate Route Frequency Ordered Stop   10/03/17 1800  azithromycin (ZITHROMAX) 200 MG/5ML suspension 500 mg     500 mg Per Tube Every 24 hours 10/03/17 1241     10/01/17 1700  cefTRIAXone (ROCEPHIN) 1 g in sodium chloride 0.9 % 100 mL IVPB     1 g 200 mL/hr over 30 Minutes Intravenous Every 24 hours 10/01/17 1613     10/01/17 1700  azithromycin (ZITHROMAX) 500 mg in sodium chloride 0.9 % 250 mL IVPB  Status:  Discontinued     500 mg 250 mL/hr over 60 Minutes Intravenous Every 24 hours 10/01/17 1613 10/03/17 1241   09/30/17 0200  piperacillin-tazobactam (ZOSYN) IVPB 3.375 g  Status:  Discontinued     3.375 g 12.5 mL/hr over 240 Minutes Intravenous Every 8 hours 09/30/17 0158 09/30/17 0532        Objective:   Vitals:   10/04/17 2023 10/04/17 2049 10/05/17 0447 10/05/17 0844  BP:  133/71 140/71   Pulse:  79 79   Resp:  18 16   Temp:  97.7 F (36.5 C) 98.2 F (36.8 C)   TempSrc:   Oral   SpO2: 95% 98% 94% 94%  Weight:   68.9 kg   Height:        Wt Readings from Last 3 Encounters:  10/05/17 68.9 kg  09/28/17 69.1 kg  08/28/17 68 kg     Intake/Output Summary (Last 24 hours) at 10/05/2017 1100 Last data filed at 10/05/2017 0639 Gross per 24 hour  Intake 3902.98 ml  Output -  Net 3902.98 ml     Physical Exam  Awake alert.   In no distress Normal effort at rest.  Clear to auscultation bilaterally Normal regular.  No S3-S4.  No rubs murmurs or bruit Abdomen is soft.  Nontender nondistended.  PEG tube is noted.  Bowel sounds present No significant pedal edema    Data Review:    CBC Recent Labs  Lab 09/30/17 0736 10/01/17 1535 10/02/17 0246 10/03/17 0423 10/04/17 0551  WBC 21.6* 15.1* 15.1* 9.3 8.2  HGB 13.2 12.6* 12.9* 12.1* 11.9*  HCT 42.1 40.9 42.5 38.5* 38.2*  PLT 145* 126* 151 142* 149*  MCV 92.5 94.7 95.1 94.6 92.3  MCH 29.0 29.2 28.9 29.7 28.7  MCHC 31.4 30.8 30.4 31.4 31.2  RDW 14.7 14.8 14.7 14.7 14.8  LYMPHSABS  --  1.3  --   --   --   MONOABS  --  3.4*  --   --   --   EOSABS  --  0.3  --   --   --   BASOSABS  --  0.1  --   --   --     Chemistries  Recent Labs  Lab 09/29/17 2217 09/30/17 0736 10/02/17 0246 10/03/17 0423 10/04/17 0551 10/05/17  0516  NA 139 138 142 144 142 144  K 4.4 4.3 4.2 4.8 3.4* 4.0  CL 98 100 108 111 113* 113*  CO2 31 29 27 23  21* 24  GLUCOSE 139* 158* 95 107* 125* 109*  BUN 29* 29* 25* 23 18 18   CREATININE 1.14 1.11 0.88 0.77 0.71 0.70  CALCIUM 9.5 8.6* 8.5* 8.5* 8.7* 8.4*  MG  --   --   --  1.8 1.4* 1.9  AST 20  --   --   --   --   --   ALT 9  --   --   --   --   --   ALKPHOS 76  --   --   --   --   --   BILITOT 1.5*  --   --   --   --   --     Inpatient Medications  Scheduled Meds: . acetaminophen (TYLENOL) oral liquid 160 mg/5 mL  650 mg Per Tube Q6H  . azithromycin  500 mg Per Tube Q24H  . chlorhexidine  15 mL Mouth Rinse BID  . digoxin  0.125 mg Per Tube Daily  . famotidine  20 mg Per Tube BID  . feeding supplement (VITAL AF 1.2 CAL)  1,500 mL Per Tube Q24H  . heparin  5,000 Units Subcutaneous Q8H  . ipratropium  1 spray Each Nare TID AC  . mouth rinse  15 mL Mouth Rinse q12n4p  . metoprolol tartrate  25 mg Per Tube BID  . mometasone-formoterol  2 puff Inhalation BID  . potassium chloride  20 mEq Per Tube BID  . tiotropium  18 mcg  Inhalation Daily   Continuous Infusions: . sodium chloride 50 mL/hr at 10/05/17 0639  . cefTRIAXone (ROCEPHIN)  IV Stopped (10/04/17 1748)   PRN Meds:.ipratropium-albuterol, lip balm, morphine injection, ondansetron **OR** ondansetron (ZOFRAN) IV, oxyCODONE  Micro Results Recent Results (from the past 240 hour(s))  MRSA PCR Screening     Status: None   Collection Time: 09/30/17  5:33 AM  Result Value Ref Range Status   MRSA by PCR NEGATIVE NEGATIVE Final    Comment:        The GeneXpert MRSA Assay (FDA approved for NASAL specimens only), is one component of a comprehensive MRSA colonization surveillance program. It is not intended to diagnose MRSA infection nor to guide or monitor treatment for MRSA infections. Performed at [facility], [place] [address] [place], [place] [address]     Radiology Reports Dg Abd 1 View  Result Date: 09/30/2017 CLINICAL DATA:  Encounter for nasogastric tube placement EXAM: ABDOMEN - 1 VIEW COMPARISON:  CT from earlier today FINDINGS: Extensive small bowel pneumatosis on the right, known from prior CT. An orogastric tube overlaps the stomach. There is also a percutaneous gastrostomy tube. Sequela of prior cardiac surgery. Detection of pneumoperitoneum is limited in the supine position. There is lower lobe atelectasis. IMPRESSION: 1. New nasogastric tube in good position. 2. Extensive pneumatosis of small bowel loops. Electronically Signed   By: Monte Fantasia M.D.   On: 09/30/2017 08:19   Ct Abdomen Pelvis W Contrast  Result Date: 09/30/2017 CLINICAL DATA:  Coffee ground emesis. Generalized abdominal pain. History of PEG tube, cholecystectomy, diverticulosis, rectal ulcer. EXAM: CT ABDOMEN AND PELVIS WITH CONTRAST TECHNIQUE: Multidetector CT imaging of the abdomen and pelvis was performed using the standard protocol following bolus administration of intravenous contrast. CONTRAST:  <See Chart> ISOVUE-300 IOPAMIDOL (ISOVUE-300) INJECTION 61%  COMPARISON:  CT abdomen and pelvis March 26, 2017 FINDINGS: LOWER CHEST: New lingular consolidation with ground-glass opacities. Similar dense rounded consolidation RIGHT lower lobe favoring round pneumonia. New subpulmonic RIGHT pleural effusion. HEPATOBILIARY: Status post cholecystectomy.  Normal liver. PANCREAS: Normal. SPLEEN: Calcified scarring, normal splenic size. ADRENALS/URINARY TRACT: Kidneys are orthotopic, demonstrating symmetric enhancement. 4 mm LEFT lower pole nephrolithiasis. 1 cm cyst lower pole RIGHT kidney. She too small to characterize hypodensities bilateral kidneys. LEFT interpolar scarring. No hydronephrosis or solid renal masses. The unopacified ureters are normal in course and caliber. Delayed imaging through the kidneys demonstrates symmetric prompt contrast excretion within the proximal urinary collecting system. Urinary bladder is partially distended and unremarkable. Normal adrenal glands. STOMACH/BOWEL: Small bowel dilated to 3.7 cm. Severe small bowel pneumatosis. Mild colonic diverticulosis. Minimal likely intravenous gas about the descending colon and small bowel. Decompressed colon with air-fluid levels. Intraluminal gastrostomy tube with mild fluid distended stomach. VASCULAR/LYMPHATIC: Aortoiliac vessels are normal in course and caliber. Mild calcific atherosclerosis. No lymphadenopathy by CT size criteria. REPRODUCTIVE: Normal. OTHER: Small volume low-density ascites.  No abscess. MUSCULOSKELETAL: Nonacute. Severe degenerative changes of the thoracolumbar spine. Severe RIGHT hip osteoarthrosis. Osteopenia. Status post median sternotomy. IMPRESSION: 1. Severe small-bowel pneumatosis intestinalis with mesenteric venous gas. Resultant small bowel obstruction versus ileus. 2. Small amount of ascites. No abscess. 3. New lingular pneumonia. Stable rounded density RIGHT lung base most compatible with round pneumonia/scarring. New small RIGHT pleural effusion. 4. Critical  Value/emergent results were called by telephone at the time of interpretation on 09/30/2017 at 12:57 am to [facility] , who verbally acknowledged these results. Aortic Atherosclerosis (ICD10-I70.0). Electronically Signed   By: Elon Alas M.D.   On: 09/30/2017 01:00   Dg Chest Port 1 View  Result Date: 10/02/2017 CLINICAL DATA:  SOB (shortness of breath) EXAM: PORTABLE CHEST 1 VIEW COMPARISON:  10/01/2017 and CT chest 03/26/2017 FINDINGS: The patient has a LEFT-sided transvenous pacemaker with lead to the RIGHT ventricle. The heart size is mildly enlarged. There are small bilateral pleural effusions. Opacity at the MEDIAL LEFT lung base obscures the hemidiaphragm and is consistent with atelectasis or infiltrate. No pulmonary edema. IMPRESSION: LEFT LOWER lobe atelectasis or infiltrate.  Small effusions. Electronically Signed   By: [name] M.D.   On: 10/02/2017 08:38   Dg Chest Port 1 View  Addendum Date: 10/01/2017   ADDENDUM REPORT: 10/01/2017 18:24 ADDENDUM: These results were called by telephone at the time of interpretation on 10/01/2017 at 6:24 pm to Dr. Rolm Bookbinder , who verbally acknowledged these results. Electronically Signed   By: Ashley Royalty M.D.   On: 10/01/2017 18:24   Result Date: 10/01/2017 CLINICAL DATA:  Encounter for brain damage due to decreased oxygen supply. EXAM: PORTABLE CHEST 1 VIEW COMPARISON:  05/22/2017 and 04/09/2017 CXR FINDINGS: 6 mm thick hyper lucency along the periphery of the right mid lung cannot exclude a small pneumothorax. No mediastinal shift is identified. The trachea is midline. No acute osseous appearing abnormality is identified. No fracture is seen. There is osteoarthritis of the glenohumeral joints bilaterally. Stable cardiomegaly with tortuous atherosclerotic nonaneurysmal appearing thoracic aorta. Hazy densities at the left lung base are noted which may reflect atelectasis or pneumonia. Chronic minimal blunting the right lateral  costophrenic angle with probable small left effusion. IMPRESSION: 1. 6 mm thick hyperlucency along the periphery of the right mid lung may reflect a small pneumothorax, less than 5%. 2. Stable cardiomegaly with aortic atherosclerosis left basilar atelectasis. Probable small left effusion and chronic minimal  blunting of the right lateral costophrenic angle which may reflect pleural thickening or chronic trace effusion. Electronically Signed: By: Ashley Royalty M.D. On: 10/01/2017 18:07     Bonnielee Haff M.D on 10/05/2017 at 11:00 AM  Between 7am to 7pm - Pager - [phone]  After 7pm go to [email or web address] - password [facility]  [facility] -  Office  [phone]

## 2017-10-05 NOTE — Progress Notes (Signed)
Physical Therapy Treatment Patient Details Name: [name] MRN: [id number] DOB: 06-02-1932 Today's Date: 10/05/2017    History of Present Illness Patient is an 84 y/o male presenting with abdominal pain and hematemesis. He underwent CT evaluation with significant pneumotosis and some small amt mesenteric venous gas with elevated wb. Exploratory laparotomy with lysis of adhesions on 09/30/17. PMH significant for AICD, AFib, CHF, CAD, DM, HTN as well as PEG placement.     PT Comments    Patient is making gradual progress toward PT goals. Pt tolerated increased gait distance this session and overall requires min guard/min A for mobility. Pt limited by abdominal pain that worsens with mobility. Continue to progress as tolerated with anticipated d/c to SNF for further skilled PT services.     Follow Up Recommendations  SNF     Equipment Recommendations  None recommended by PT    Recommendations for Other Services       Precautions / Restrictions Precautions Precautions: Fall Restrictions Weight Bearing Restrictions: No    Mobility  Bed Mobility Overal bed mobility: Needs Assistance Bed Mobility: Sit to Sidelying;Rolling Rolling: Min guard       Sit to sidelying: Min assist General bed mobility comments: assist to bring bilat LE into bed; cues for sequencing   Transfers Overall transfer level: Needs assistance Equipment used: Rolling walker (2 wheeled) Transfers: Sit to/from Stand Sit to Stand: Min guard;Min assist         General transfer comment: min guard from [facility] and min A from EOB; cues for hand placement and assist to power up into standing  Ambulation/Gait Ambulation/Gait assistance: Min guard Gait Distance (Feet): 65 Feet Assistive device: Rolling walker (2 wheeled) Gait Pattern/deviations: Trunk flexed;Step-through pattern;Decreased step length - right;Decreased step length - left;Shuffle;Decreased dorsiflexion - right;Decreased dorsiflexion - left Gait  velocity: decreased   General Gait Details: cues for posture and increased stride length; slow cadence; limited by abdominal pain    Stairs             Wheelchair Mobility    Modified Rankin (Stroke Patients Only)       Balance Overall balance assessment: Mild deficits observed, not formally tested                                          Cognition Arousal/Alertness: Awake/alert Behavior During Therapy: WFL for tasks assessed/performed Overall Cognitive Status: Within Functional Limits for tasks assessed                                        Exercises      General Comments        Pertinent Vitals/Pain Pain Assessment: Faces Faces Pain Scale: Hurts even more Pain Location: abdomen Pain Descriptors / Indicators: Aching;Discomfort;Grimacing Pain Intervention(s): Limited activity within patient's tolerance;Monitored during session;Repositioned;Patient requesting pain meds-RN notified    Home Living                      Prior Function            PT Goals (current goals can now be found in the care plan section) Acute Rehab PT Goals Patient Stated Goal: regain mobility PT Goal Formulation: With patient Time For Goal Achievement: 10/17/17 Potential to Achieve Goals: Good Progress towards PT goals:  Progressing toward goals    Frequency    Min 3X/week      PT Plan Current plan remains appropriate    Co-evaluation              AM-PAC PT "6 Clicks" Daily Activity  Outcome Measure  Difficulty turning over in bed (including adjusting bedclothes, sheets and blankets)?: A Lot Difficulty moving from lying on back to sitting on the side of the bed? : Unable Difficulty sitting down on and standing up from a chair with arms (e.g., wheelchair, bedside commode, etc,.)?: Unable Help needed moving to and from a bed to chair (including a wheelchair)?: A Little Help needed walking in hospital room?: A Little Help  needed climbing 3-5 steps with a railing? : A Lot 6 Click Score: 12    End of Session Equipment Utilized During Treatment: Gait belt Activity Tolerance: Patient limited by pain Patient left: with call bell/phone within reach;in bed Nurse Communication: Mobility status PT Visit Diagnosis: Unsteadiness on feet (R26.81);Other abnormalities of gait and mobility (R26.89);Muscle weakness (generalized) (M62.81)     Time: 2244-9753 PT Time Calculation (min) (ACUTE ONLY): 23 min  Charges:  $Gait Training: 8-22 mins $Therapeutic Activity: 8-22 mins                     Earney Navy, PTA Pager: [phone]     Darliss Cheney 10/05/2017, 4:36 PM

## 2017-10-05 NOTE — Progress Notes (Addendum)
Nutrition Follow-up  DOCUMENTATION CODES:   Severe malnutrition in context of chronic illness  INTERVENTION:   -D/c Vital AF 1.2  -Initiate bolus feedings of  240 ml Osmolite 1.5 6 times daily via PEG  When no IVFs, recommend 120 ml free water flush 6 times daily   Tube feeding regimen provides 2160 kcal (100% of needs), 90 grams of protein, and 1097 ml of H2O.    NUTRITION DIAGNOSIS:   Severe Malnutrition related to chronic illness(CHF) as evidenced by severe muscle depletion, moderate muscle depletion, percent weight loss, moderate fat depletion, severe fat depletion.  Ongoing  GOAL:   Patient will meet greater than or equal to 90% of their needs  Progressing  MONITOR:   Weight trends, Labs, TF tolerance, Skin, I & O's  REASON FOR ASSESSMENT:   Consult Enteral/tube feeding initiation and management  ASSESSMENT:    84 yo male admitted with abdominal pain and pneumatosis intestinalis with SBO secondary to adhesions. Pt s/p ex lap/lysis of adhesions 8/31.PMH of CHF, CAD, DM, diverticulosis, dyslipidemia, GERD, hepatic steatosis, HTN  Per general surgery notes, plan to transition pt to bolus feedings today.   Spoke with pt at bedside, who complains of abdominal pain this morning. He denied any abdominal pain up until this morning per his report.   Noted Vital AF 1.2 is infusing via PEG @ 40 ml/hr, which is providing 1152 kcals, 72 grams protein, and 779 ml free water daily (meeting 66% of estimated kcals and 72% of estimated protein needs).   Pt still does not recall PTA feeding regimen. Pt reports he believes he was receiving feeding "3 times per day". Reviewed records from [facility]; PTA TF regimen was 240 ml Jevity 1.5 6 times daily with 120 ml free water flush every 4 hours (which provides 2160 kcals, 92 grams protein, and 1884 ml free water, meeting >100% of estimated kcal needs and 92% of estimated protein needs).  Given pt's abdominal pain, will trial  Osmolite 1.5 instead of transitioning back to Jevity 1.5 (Osmolite has no fiber in formula).    Labs reviewed: CBGS: 101-125.  Diet Order:   Diet Order    None      EDUCATION NEEDS:   No education needs have been identified at this time  Skin:  Skin Assessment: Reviewed RN Assessment  Last BM:  10/04/17  Height:   Ht Readings from Last 1 Encounters:  09/30/17 6' (1.829 m)    Weight:   Wt Readings from Last 1 Encounters:  10/05/17 68.9 kg    Ideal Body Weight:  80.9 kg  BMI:  Body mass index is 20.61 kg/m.  Estimated Nutritional Needs:   Kcal:  1750-1990 kcals   Protein:  100-125 g  Fluid:  >/= 1.8 L    Jonnatan Hanners A. Jimmye Norman, RD, LDN, CDE Pager: [phone] After hours Pager: [phone]

## 2017-10-05 NOTE — Consult Note (Signed)
   [facility] CM Inpatient Consult   10/05/2017  [name] Nov 13, 1932 [id number]  Patient screened for high risk scores  [place] [facility] services in the Medicare plan. However, this patient is a long term resident in a skilled nursing facility.  Admitted with GI issues.  No [facility] community follow up needs noted.   For questions contact:   [name], RN BSN [facility] Liaison  [phone] business mobile phone Toll free office [phone]

## 2017-10-06 LAB — GLUCOSE, CAPILLARY
Glucose-Capillary: 108 mg/dL — ABNORMAL HIGH (ref 70–99)
Glucose-Capillary: 137 mg/dL — ABNORMAL HIGH (ref 70–99)
Glucose-Capillary: 94 mg/dL (ref 70–99)
Glucose-Capillary: 98 mg/dL (ref 70–99)

## 2017-10-06 MED ORDER — CEPHALEXIN 250 MG PO CAPS: 250.0000 mg | ORAL_CAPSULE | Freq: Three times a day (TID) | ORAL | 0 refills | Status: AC

## 2017-10-06 MED ORDER — AZITHROMYCIN 200 MG/5ML PO SUSR: 500.0000 mg | ORAL | 0 refills | Status: AC

## 2017-10-06 MED ORDER — FREE WATER
120.0000 mL | Status: DC
  Administered 2017-10-06 (×2): 120 mL

## 2017-10-06 MED ORDER — TRAMADOL HCL 50 MG PO TABS: 50.0000 mg | ORAL_TABLET | Freq: Four times a day (QID) | ORAL | 0 refills | Status: DC | PRN

## 2017-10-06 MED ORDER — METOPROLOL TARTRATE 25 MG PO TABS: 25.0000 mg | ORAL_TABLET | Freq: Two times a day (BID) | ORAL | Status: AC

## 2017-10-06 NOTE — Progress Notes (Signed)
Patient was transferred via [facility] in stable condition. Report was called to [place] at [facility] prior to transfer.

## 2017-10-06 NOTE — Discharge Summary (Signed)
[facility] Discharge Summary   Patient ID: [name] MRN: [id number] DOB/AGE: 1932/08/16 84 y.o.  Admit date: 09/29/2017 Discharge date: 10/06/2017  Discharge Diagnosis Patient Active Problem List   Diagnosis Date Noted  . Protein-calorie malnutrition, severe 10/04/2017  . Small bowel ischemia ([place]) 09/30/2017  . Chronic pulmonary aspiration 07/31/2017  . H. pylori infection 07/24/2017  . Oral thrush 06/03/2017  . Chronic allergic rhinitis 05/14/2017  . Polyneuropathy 05/14/2017  . COPD (chronic obstructive pulmonary disease) ([place]) 05/02/2017  . Hypertensive heart disease with heart failure ([place]) 04/27/2017  . Adult failure to thrive   . DNR (do not resuscitate)   . Palliative care by specialist   . Malnutrition of moderate degree 04/13/2017  . CKD (chronic kidney disease), stage III ([place]) 04/07/2017  . Thyroid nodule 04/01/2017  . Chronic pulmonary embolism ([place]) 03/27/2017  . Failure of implantable cardioverter-defibrillator (ICD) lead 03/02/2017  . Chronic constipation 07/13/2016  . ICD (implantable cardioverter-defibrillator) in place 04/08/2016  . Pharyngoesophageal dysphagia 12/31/2013  . Gastroesophageal reflux disease without esophagitis 12/31/2013  . Monocytosis 04/17/2013  . Pleural effusion, right 10/03/2011  . Anemia, iron deficiency 12/29/2010  . Coronary artery disease involving native coronary artery of native heart with angina pectoris ([place]) 06/25/2010  . Chronic atrial fibrillation ([place]) 12/30/2009  . Chronic systolic heart failure- EF 35-40% 12/30/2009  . Automatic implantable cardioverter-defibrillator in situ 12/30/2009   Consultants [facility]   Procedures Dr. Rolm Bookbinder (09/30/17) exploratory laparotomy with lysis of adhesions  Hospital Course:  Mr. Polivka is an 84 year old male with multiple medical problems including atrial fibrillation with an ICD in place, not on oral anticoagulation, who presented to [facility] emergency department from a skilled nursing facility with a chief complaint of 1 week of abdominal pain and associated hematemesis.  He has a PEG tube in place where he receives all of his nutrition due to dysphasia.  He was evaluated in the emergency department where CT of the abdomen and pelvis revealed significant pneumatosis along with a small amount of mesenteric venous gas and associated leukocytosis.  Work-up also revealed a right lingula pneumonia with pleural effusion. The patient was taken emergently for the above procedure by Dr. Rolm Bookbinder.  Postoperatively he was extubated and admitted to the ICU in stable condition.  The hospitalist service was consulted for assistance with management of patient's multiple medical problems and pneumonia.  He was continued on IV antibiotics for this. Foley catheter and nasogastric tube were discontinued on postop day #2.  Patient's bowel function did return.  He was started on trickle tube feeds and these were advanced to goal as patient tolerated, then transition to bolus feeds and free water flushes.  Physical and Occupational Therapy evaluated the patient and continue to recommend skilled nursing. On postop day #6 the patient was afebrile, vital signs stable, heart rate controlled on home medications, tolerating tube feeds, having bowel function, and medically stable for discharge to skilled nursing facility. He will need to complete another two days of antibiotic therapy per G-tube (course ends 10/07/17). He should follow up as below and knows to call with questions or concerns.  Physical Exam: General:  Alert, NAD, pleasant, comfortable CV: regular rate, no peripheral edema Abd:  Soft, non-tender, non-distended, midline incisions clean and dry, staples in place with very mild erythema, G-tube clamped and dressing is clean and dry.   Allergies as of 10/06/2017      Reactions   Tamsulosin Other (See Comments)   Dizziness, Made  BP very low and  weakness   Celebrex [celecoxib] Hives, Nausea And Vomiting   Gi upset   Dronedarone Nausea And Vomiting, Other (See Comments)   GI upset, abdominal pain   Esomeprazole Magnesium Hives, Other (See Comments)   "don't really remember"   Digoxin Diarrhea   May have caused some diarrhea   Hydrocodone-acetaminophen Other (See Comments)   Bad headache   Protonix [pantoprazole Sodium] Nausea And Vomiting, Other (See Comments)   Tolerates Dexilant      Medication List    STOP taking these medications   Lisinopril 1 MG/ML Soln     TAKE these medications   acetaminophen 325 MG tablet Commonly known as:  TYLENOL Place 650 mg into feeding tube every 6 (six) hours as needed for mild pain. What changed:  Another medication with the same name was removed. Continue taking this medication, and follow the directions you see here.   ADVAIR DISKUS 250-50 MCG/DOSE Aepb Generic drug:  Fluticasone-Salmeterol Inhale 1 puff into the lungs 2 (two) times daily.   azithromycin 200 MG/5ML suspension Commonly known as:  ZITHROMAX Place 12.5 mLs (500 mg total) into feeding tube daily for 2 days.   BIOTENE MOISTURIZING MOUTH Soln Give 1 swab by mouth every 4 hours while awake   cephALEXin 250 MG capsule Commonly known as:  KEFLEX Take 1 capsule (250 mg total) by mouth 3 (three) times daily for 2 days.   digoxin 0.05 MG/ML solution Commonly known as:  LANOXIN Place 2.5 mLs (0.125 mg total) into feeding tube daily.   feeding supplement (JEVITY 1.5 CAL) Liqd Jevity 1.5 -  Give 240cc  Six times daily What changed:  Another medication with the same name was removed. Continue taking this medication, and follow the directions you see here.   free water Soln Place 120 mLs into feeding tube every 4 (four) hours. 120 ml H2o flushes via tube every 4 hours   furosemide 10 MG/ML solution Commonly known as:  LASIX Place 20 mg into feeding tube daily. Give 2 ml (20mg ) via PEG - Tube daily   ipratropium 0.03  % nasal spray Commonly known as:  ATROVENT Place 1 spray into both nostrils 3 (three) times daily before meals.   ipratropium-albuterol 0.5-2.5 (3) MG/3ML Soln Commonly known as:  DUONEB Take 3 mLs by nebulization every 4 (four) hours as needed. What changed:  reasons to take this   metoprolol tartrate 25 MG tablet Commonly known as:  LOPRESSOR Place 1 tablet (25 mg total) into feeding tube 2 (two) times daily. What changed:  how to take this   nitroGLYCERIN 0.4 MG SL tablet Commonly known as:  NITROSTAT Place 1 tablet (0.4 mg total) under the tongue every 5 (five) minutes as needed for chest pain.   omeprazole 20 MG capsule Commonly known as:  PRILOSEC Take 20 mg by mouth daily.   ondansetron 4 MG tablet Commonly known as:  ZOFRAN Place 4 mg into feeding tube every 6 (six) hours as needed for nausea or vomiting.   polyethylene glycol packet Commonly known as:  MIRALAX / GLYCOLAX Place 17 g into feeding tube 2 (two) times daily.   potassium chloride 20 MEQ/15ML (10%) Soln Place 7.5 mLs (10 mEq total) into feeding tube daily.   RESTASIS 0.05 % ophthalmic emulsion Generic drug:  cycloSPORINE INSTILL 1 DROP INTO BOTH EYES TWICE A DAY   tiotropium 18 MCG inhalation capsule Commonly known as:  SPIRIVA Place 18 mcg into inhaler and inhale daily.   traMADol  50 MG tablet Commonly known as:  ULTRAM Take 50-100 mg by mouth every 6 (six) hours as needed for moderate pain.        Contact information for follow-up providers    Rolm Bookbinder, MD. Go on 10/25/2017.   Specialty:  General Surgery Why:  Follow up appointment scheduled for 10:20 AM. Please arrive 15 min prior to appointment time. Bring photo ID and insurance information.  Contact information: [address] [place] [address] [phone]        Surgery, [place]. Go on 10/13/2017.   Specialty:  General Surgery Why:  Appointment for staple removal scheduled for 10:00 AM. Please arrive  30 min prior to appointment time. Bring photo ID and insurance information.  Contact information: [address] [place] [address] [phone]        [name], MD. Call.   Specialty:  Family Medicine Why:  Call and schedule a follow up appointment with PCP for post-hospital visit  Contact information: [address] [place] [address] [phone]        [name], Peter M, MD .   Specialty:  Cardiology Contact information: [address] [place] [address] [phone]            Contact information for after-discharge care    Destination    [facility] .   Service:  Skilled Nursing Contact information: [address] [phone]                  Signed: Obie Dredge, [facility] Surgery 10/06/2017, 10:18 AM

## 2017-10-06 NOTE — Progress Notes (Signed)
PROGRESS NOTE                                                                                                                                                                                                             Patient Demographics:    [name], is a 84 y.o. male, DOB - May 15, 1932, LXB:[id number]  Admit date - 09/29/2017   Admitting Physician Rolm Bookbinder, MD  Outpatient Primary MD for the patient is [name], MD  LOS - 6                                                                                 Consult follow up   Chief Complaint  Patient presents with  . Hematemesis  . Abdominal Pain       Brief Narrative    84 y.o. male with past medical history significant for atrial fibrillation not currently on anticoagulation but he is on digoxin congestive heart failure coronary artery disease diabetes oropharyngeal dysphagia with PEG tube in place who admitted under surgical service for small bowel obstruction, status post exploratory laparotomy with adhesion of lysis 09/30/2017, hospitalist consulted for medical management,   Subjective:   Patient states that he feels better.  Still experiencing some abdominal discomfort but better than before.  Denies any nausea vomiting.   Assessment  & Plan :    Active Problems:   Small bowel ischemia (HCC)   Protein-calorie malnutrition, severe   Small bowel obstruction Management per general surgery  Right lung base pneumonia He presented with leukocytosis, CT with evidence of right lung pneumonia.  Due to his presenting symptoms of nausea and vomiting there was concern for aspiration.  Patient was given Zosyn on 8/31.  Then he was started on ceftriaxone and azithromycin on/1.  Will need a total of 7 days of treatment.  Discussed with general surgery.  Continue azithromycin for 2 more days.  Could change ceftriaxone to Keflex for 2 more days.  His respiratory status is stable.  He  does not have  any respiratory symptoms currently.  Chronic dysphagia -He has PEG tube.  Feeds per general surgery.  Chronic A. Fib Stable.  Rate controlled on metoprolol and digoxin.  Not on anticoagulation due to history of GI bleed.    Polyneuropathy Was supposedly on gabapentin at home.  Not seen on his home medication list.  Chronic systolic heart failure/EF 35%/status post AICD Seems well compensated at this time.  Could resume his Lasix.  Monitor his electrolytes closely in the outpatient setting.  If his blood pressure remains stable then lisinopril could also be reinitiated in the next week or so.     Normocytic anemia No evidence for overt bleeding.  Hemoglobin has been stable.  It appears the patient is being discharged to skilled nursing facility.  From a medical standpoint he is okay for discharge today.  Code Status : Full code  Procedures  : Post exploratory laparotomy, lysis of adhesion on 09/30/2017 by Dr. Donne Hazel  Antibiotics  :    Anti-infectives (From admission, onward)   Start     Dose/Rate Route Frequency Ordered Stop   10/06/17 0000  azithromycin (ZITHROMAX) 200 MG/5ML suspension     500 mg Per Tube Every 24 hours 10/06/17 1018 10/08/17 2359   10/06/17 0000  cephALEXin (KEFLEX) 250 MG capsule     250 mg Oral 3 times daily 10/06/17 1018 10/08/17 2359   10/03/17 1800  azithromycin (ZITHROMAX) 200 MG/5ML suspension 500 mg     500 mg Per Tube Every 24 hours 10/03/17 1241     10/01/17 1700  cefTRIAXone (ROCEPHIN) 1 g in sodium chloride 0.9 % 100 mL IVPB     1 g 200 mL/hr over 30 Minutes Intravenous Every 24 hours 10/01/17 1613     10/01/17 1700  azithromycin (ZITHROMAX) 500 mg in sodium chloride 0.9 % 250 mL IVPB  Status:  Discontinued     500 mg 250 mL/hr over 60 Minutes Intravenous Every 24 hours 10/01/17 1613 10/03/17 1241   09/30/17 0200  piperacillin-tazobactam (ZOSYN) IVPB 3.375 g  Status:  Discontinued     3.375 g 12.5 mL/hr over 240 Minutes  Intravenous Every 8 hours 09/30/17 0158 09/30/17 0532        Objective:   Vitals:   10/05/17 1500 10/05/17 2103 10/05/17 2241 10/06/17 0926  BP: 137/73  127/66   Pulse: 80  77   Resp: 16     Temp: 98.1 F (36.7 C)  97.6 F (36.4 C)   TempSrc: Oral  Oral   SpO2: 97% 91% 95% 90%  Weight:      Height:        Wt Readings from Last 3 Encounters:  10/05/17 68.9 kg  09/28/17 69.1 kg  08/28/17 68 kg     Intake/Output Summary (Last 24 hours) at 10/06/2017 1102 Last data filed at 10/06/2017 0500 Gross per 24 hour  Intake 974 ml  Output 5 ml  Net 969 ml     Physical Exam  Awake alert.  In no distress Normal effort at rest.  Clear to auscultation bilaterally.  No wheezing rales or rhonchi S1-S2 is normal regular.  No S3-S4. Abdomen is soft.  Nontender nondistended.  PEG tube is present.  Bowel sounds present.  Dressing noted.    Data Review:    CBC Recent Labs  Lab 09/30/17 0736 10/01/17 1535 10/02/17 0246 10/03/17 0423 10/04/17 0551  WBC 21.6* 15.1* 15.1* 9.3 8.2  HGB 13.2 12.6* 12.9* 12.1* 11.9*  HCT 42.1 40.9 42.5 38.5* 38.2*  PLT 145* 126* 151 142* 149*  MCV 92.5 94.7 95.1 94.6 92.3  MCH 29.0 29.2 28.9 29.7 28.7  MCHC 31.4 30.8 30.4 31.4 31.2  RDW 14.7 14.8 14.7 14.7 14.8  LYMPHSABS  --  1.3  --   --   --   MONOABS  --  3.4*  --   --   --   EOSABS  --  0.3  --   --   --   BASOSABS  --  0.1  --   --   --     Chemistries  Recent Labs  Lab 09/29/17 2217 09/30/17 0736 10/02/17 0246 10/03/17 0423 10/04/17 0551 10/05/17 0516  NA 139 138 142 144 142 144  K 4.4 4.3 4.2 4.8 3.4* 4.0  CL 98 100 108 111 113* 113*  CO2 31 29 27 23  21* 24  GLUCOSE 139* 158* 95 107* 125* 109*  BUN 29* 29* 25* 23 18 18   CREATININE 1.14 1.11 0.88 0.77 0.71 0.70  CALCIUM 9.5 8.6* 8.5* 8.5* 8.7* 8.4*  MG  --   --   --  1.8 1.4* 1.9  AST 20  --   --   --   --   --   ALT 9  --   --   --   --   --   ALKPHOS 76  --   --   --   --   --   BILITOT 1.5*  --   --   --   --   --      Inpatient Medications  Scheduled Meds: . acetaminophen (TYLENOL) oral liquid 160 mg/5 mL  650 mg Per Tube Q6H  . azithromycin  500 mg Per Tube Q24H  . chlorhexidine  15 mL Mouth Rinse BID  . digoxin  0.125 mg Per Tube Daily  . famotidine  20 mg Per Tube BID  . feeding supplement (OSMOLITE 1.5 CAL)  240 mL Per Tube 6 X Daily  . free water  120 mL Per Tube Q4H  . heparin  5,000 Units Subcutaneous Q8H  . ipratropium  1 spray Each Nare TID AC  . mouth rinse  15 mL Mouth Rinse q12n4p  . metoprolol tartrate  25 mg Per Tube BID  . mometasone-formoterol  2 puff Inhalation BID  . tiotropium  18 mcg Inhalation Daily   Continuous Infusions: . cefTRIAXone (ROCEPHIN)  IV 1 g (10/05/17 1634)   PRN Meds:.ipratropium-albuterol, lip balm, morphine injection, ondansetron **OR** ondansetron (ZOFRAN) IV, oxyCODONE  Micro Results Recent Results (from the past 240 hour(s))  MRSA PCR Screening     Status: None   Collection Time: 09/30/17  5:33 AM  Result Value Ref Range Status   MRSA by PCR NEGATIVE NEGATIVE Final    Comment:        The GeneXpert MRSA Assay (FDA approved for NASAL specimens only), is one component of a comprehensive MRSA colonization surveillance program. It is not intended to diagnose MRSA infection nor to guide or monitor treatment for MRSA infections. Performed at [facility], [place] [address], [place], [place] [address]     Radiology Reports Dg Abd 1 View  Result Date: 09/30/2017 CLINICAL DATA:  Encounter for nasogastric tube placement EXAM: ABDOMEN - 1 VIEW COMPARISON:  CT from earlier today FINDINGS: Extensive small bowel pneumatosis on the right, known from prior CT. An orogastric tube overlaps the stomach. There is also a percutaneous gastrostomy tube. Sequela of prior cardiac surgery. Detection of pneumoperitoneum is  limited in the supine position. There is lower lobe atelectasis. IMPRESSION: 1. New nasogastric tube in good position. 2. Extensive pneumatosis  of small bowel loops. Electronically Signed   By: Monte Fantasia M.D.   On: 09/30/2017 08:19   Ct Abdomen Pelvis W Contrast  Result Date: 09/30/2017 CLINICAL DATA:  Coffee ground emesis. Generalized abdominal pain. History of PEG tube, cholecystectomy, diverticulosis, rectal ulcer. EXAM: CT ABDOMEN AND PELVIS WITH CONTRAST TECHNIQUE: Multidetector CT imaging of the abdomen and pelvis was performed using the standard protocol following bolus administration of intravenous contrast. CONTRAST:  <See Chart> ISOVUE-300 IOPAMIDOL (ISOVUE-300) INJECTION 61% COMPARISON:  CT abdomen and pelvis March 26, 2017 FINDINGS: LOWER CHEST: New lingular consolidation with ground-glass opacities. Similar dense rounded consolidation RIGHT lower lobe favoring round pneumonia. New subpulmonic RIGHT pleural effusion. HEPATOBILIARY: Status post cholecystectomy.  Normal liver. PANCREAS: Normal. SPLEEN: Calcified scarring, normal splenic size. ADRENALS/URINARY TRACT: Kidneys are orthotopic, demonstrating symmetric enhancement. 4 mm LEFT lower pole nephrolithiasis. 1 cm cyst lower pole RIGHT kidney. She too small to characterize hypodensities bilateral kidneys. LEFT interpolar scarring. No hydronephrosis or solid renal masses. The unopacified ureters are normal in course and caliber. Delayed imaging through the kidneys demonstrates symmetric prompt contrast excretion within the proximal urinary collecting system. Urinary bladder is partially distended and unremarkable. Normal adrenal glands. STOMACH/BOWEL: Small bowel dilated to 3.7 cm. Severe small bowel pneumatosis. Mild colonic diverticulosis. Minimal likely intravenous gas about the descending colon and small bowel. Decompressed colon with air-fluid levels. Intraluminal gastrostomy tube with mild fluid distended stomach. VASCULAR/LYMPHATIC: Aortoiliac vessels are normal in course and caliber. Mild calcific atherosclerosis. No lymphadenopathy by CT size criteria. REPRODUCTIVE:  Normal. OTHER: Small volume low-density ascites.  No abscess. MUSCULOSKELETAL: Nonacute. Severe degenerative changes of the thoracolumbar spine. Severe RIGHT hip osteoarthrosis. Osteopenia. Status post median sternotomy. IMPRESSION: 1. Severe small-bowel pneumatosis intestinalis with mesenteric venous gas. Resultant small bowel obstruction versus ileus. 2. Small amount of ascites. No abscess. 3. New lingular pneumonia. Stable rounded density RIGHT lung base most compatible with round pneumonia/scarring. New small RIGHT pleural effusion. 4. Critical Value/emergent results were called by telephone at the time of interpretation on 09/30/2017 at 12:57 am to [facility] , who verbally acknowledged these results. Aortic Atherosclerosis (ICD10-I70.0). Electronically Signed   By: Elon Alas M.D.   On: 09/30/2017 01:00   Dg Chest Port 1 View  Result Date: 10/02/2017 CLINICAL DATA:  SOB (shortness of breath) EXAM: PORTABLE CHEST 1 VIEW COMPARISON:  10/01/2017 and CT chest 03/26/2017 FINDINGS: The patient has a LEFT-sided transvenous pacemaker with lead to the RIGHT ventricle. The heart size is mildly enlarged. There are small bilateral pleural effusions. Opacity at the MEDIAL LEFT lung base obscures the hemidiaphragm and is consistent with atelectasis or infiltrate. No pulmonary edema. IMPRESSION: LEFT LOWER lobe atelectasis or infiltrate.  Small effusions. Electronically Signed   By: [name] M.D.   On: 10/02/2017 08:38   Dg Chest Port 1 View  Addendum Date: 10/01/2017   ADDENDUM REPORT: 10/01/2017 18:24 ADDENDUM: These results were called by telephone at the time of interpretation on 10/01/2017 at 6:24 pm to Dr. Rolm Bookbinder , who verbally acknowledged these results. Electronically Signed   By: Ashley Royalty M.D.   On: 10/01/2017 18:24   Result Date: 10/01/2017 CLINICAL DATA:  Encounter for brain damage due to decreased oxygen supply. EXAM: PORTABLE CHEST 1 VIEW COMPARISON:  05/22/2017 and  04/09/2017 CXR FINDINGS: 6 mm thick hyper lucency along the periphery of the right mid lung cannot  exclude a small pneumothorax. No mediastinal shift is identified. The trachea is midline. No acute osseous appearing abnormality is identified. No fracture is seen. There is osteoarthritis of the glenohumeral joints bilaterally. Stable cardiomegaly with tortuous atherosclerotic nonaneurysmal appearing thoracic aorta. Hazy densities at the left lung base are noted which may reflect atelectasis or pneumonia. Chronic minimal blunting the right lateral costophrenic angle with probable small left effusion. IMPRESSION: 1. 6 mm thick hyperlucency along the periphery of the right mid lung may reflect a small pneumothorax, less than 5%. 2. Stable cardiomegaly with aortic atherosclerosis left basilar atelectasis. Probable small left effusion and chronic minimal blunting of the right lateral costophrenic angle which may reflect pleural thickening or chronic trace effusion. Electronically Signed: By: Ashley Royalty M.D. On: 10/01/2017 18:07     Bonnielee Haff M.D on 10/06/2017 at 11:02 AM  Between 7am to 7pm - Pager - [phone]  After 7pm go to [email or web address] - password [facility]  [facility] -  Office  [phone]

## 2017-10-06 NOTE — Progress Notes (Signed)
Clinical Social Worker facilitated patient discharge including contacting patient family and facility to confirm patient discharge plans.  Clinical information faxed to facility and family agreeable with plan.  CSW arranged ambulance transport via [place] to [facility] .  RN to call report [phone] (will go in rm# [id number]) for  prior to discharge.  Clinical Social Worker will sign off for now as social work intervention is no longer needed. Please consult [place] again if new need arises.  Rhea Pink, MSW, [place]

## 2017-10-08 DIAGNOSIS — Z8701 Personal history of pneumonia (recurrent): Secondary | ICD-10-CM | POA: Diagnosis not present

## 2017-10-08 DIAGNOSIS — M6281 Muscle weakness (generalized): Secondary | ICD-10-CM | POA: Diagnosis not present

## 2017-10-08 DIAGNOSIS — R1314 Dysphagia, pharyngoesophageal phase: Secondary | ICD-10-CM | POA: Diagnosis not present

## 2017-10-08 DIAGNOSIS — R293 Abnormal posture: Secondary | ICD-10-CM | POA: Diagnosis not present

## 2017-10-08 DIAGNOSIS — K56600 Partial intestinal obstruction, unspecified as to cause: Secondary | ICD-10-CM | POA: Diagnosis not present

## 2017-10-08 DIAGNOSIS — R2681 Unsteadiness on feet: Secondary | ICD-10-CM | POA: Diagnosis not present

## 2017-10-08 DIAGNOSIS — K219 Gastro-esophageal reflux disease without esophagitis: Secondary | ICD-10-CM | POA: Diagnosis not present

## 2017-10-09 DIAGNOSIS — M6281 Muscle weakness (generalized): Secondary | ICD-10-CM | POA: Diagnosis not present

## 2017-10-09 DIAGNOSIS — K56600 Partial intestinal obstruction, unspecified as to cause: Secondary | ICD-10-CM | POA: Diagnosis not present

## 2017-10-09 DIAGNOSIS — R293 Abnormal posture: Secondary | ICD-10-CM | POA: Diagnosis not present

## 2017-10-09 DIAGNOSIS — R2681 Unsteadiness on feet: Secondary | ICD-10-CM | POA: Diagnosis not present

## 2017-10-09 DIAGNOSIS — K219 Gastro-esophageal reflux disease without esophagitis: Secondary | ICD-10-CM | POA: Diagnosis not present

## 2017-10-09 DIAGNOSIS — R1314 Dysphagia, pharyngoesophageal phase: Secondary | ICD-10-CM | POA: Diagnosis not present

## 2017-10-10 ENCOUNTER — Other Ambulatory Visit: Payer: Self-pay

## 2017-10-10 ENCOUNTER — Emergency Department (HOSPITAL_COMMUNITY): Payer: Medicare Other

## 2017-10-10 ENCOUNTER — Encounter: Payer: Self-pay | Admitting: Adult Health

## 2017-10-10 ENCOUNTER — Encounter (HOSPITAL_COMMUNITY): Payer: Self-pay | Admitting: Emergency Medicine

## 2017-10-10 ENCOUNTER — Emergency Department (HOSPITAL_COMMUNITY)
Admission: EM | Admit: 2017-10-10 | Discharge: 2017-10-10 | Disposition: A | Discharge status: Home / Self Care | Payer: Medicare Other | Attending: Emergency Medicine | Admitting: Emergency Medicine

## 2017-10-10 DIAGNOSIS — J45909 Unspecified asthma, uncomplicated: Secondary | ICD-10-CM | POA: Insufficient documentation

## 2017-10-10 DIAGNOSIS — Z95810 Presence of automatic (implantable) cardiac defibrillator: Secondary | ICD-10-CM | POA: Insufficient documentation

## 2017-10-10 DIAGNOSIS — N183 Chronic kidney disease, stage 3 (moderate): Secondary | ICD-10-CM | POA: Diagnosis not present

## 2017-10-10 DIAGNOSIS — E119 Type 2 diabetes mellitus without complications: Secondary | ICD-10-CM | POA: Diagnosis not present

## 2017-10-10 DIAGNOSIS — I130 Hypertensive heart and chronic kidney disease with heart failure and stage 1 through stage 4 chronic kidney disease, or unspecified chronic kidney disease: Secondary | ICD-10-CM | POA: Insufficient documentation

## 2017-10-10 DIAGNOSIS — R1084 Generalized abdominal pain: Secondary | ICD-10-CM | POA: Diagnosis present

## 2017-10-10 DIAGNOSIS — Z79899 Other long term (current) drug therapy: Secondary | ICD-10-CM | POA: Insufficient documentation

## 2017-10-10 DIAGNOSIS — R1033 Periumbilical pain: Secondary | ICD-10-CM | POA: Insufficient documentation

## 2017-10-10 DIAGNOSIS — R279 Unspecified lack of coordination: Secondary | ICD-10-CM | POA: Diagnosis not present

## 2017-10-10 DIAGNOSIS — N200 Calculus of kidney: Secondary | ICD-10-CM | POA: Diagnosis not present

## 2017-10-10 DIAGNOSIS — Z87891 Personal history of nicotine dependence: Secondary | ICD-10-CM | POA: Insufficient documentation

## 2017-10-10 DIAGNOSIS — Z743 Need for continuous supervision: Secondary | ICD-10-CM | POA: Diagnosis not present

## 2017-10-10 DIAGNOSIS — G8918 Other acute postprocedural pain: Secondary | ICD-10-CM | POA: Diagnosis not present

## 2017-10-10 DIAGNOSIS — I5022 Chronic systolic (congestive) heart failure: Secondary | ICD-10-CM | POA: Insufficient documentation

## 2017-10-10 DIAGNOSIS — J90 Pleural effusion, not elsewhere classified: Secondary | ICD-10-CM | POA: Diagnosis not present

## 2017-10-10 DIAGNOSIS — I2510 Atherosclerotic heart disease of native coronary artery without angina pectoris: Secondary | ICD-10-CM | POA: Insufficient documentation

## 2017-10-10 DIAGNOSIS — F4321 Adjustment disorder with depressed mood: Secondary | ICD-10-CM | POA: Diagnosis not present

## 2017-10-10 DIAGNOSIS — R110 Nausea: Secondary | ICD-10-CM | POA: Diagnosis not present

## 2017-10-10 DIAGNOSIS — R404 Transient alteration of awareness: Secondary | ICD-10-CM | POA: Diagnosis not present

## 2017-10-10 LAB — COMPREHENSIVE METABOLIC PANEL
ALBUMIN: 3.1 g/dL — AB (ref 3.5–5.0)
ALT: 15 U/L (ref 0–44)
AST: 26 U/L (ref 15–41)
Alkaline Phosphatase: 98 U/L (ref 38–126)
Anion gap: 12 (ref 5–15)
BUN: 13 mg/dL (ref 8–23)
CHLORIDE: 100 mmol/L (ref 98–111)
CO2: 24 mmol/L (ref 22–32)
Calcium: 8.7 mg/dL — ABNORMAL LOW (ref 8.9–10.3)
Creatinine, Ser: 0.84 mg/dL (ref 0.61–1.24)
GFR calc Af Amer: >60 mL/min (ref >60)
GLUCOSE: 152 mg/dL — AB (ref 70–99)
POTASSIUM: 4.8 mmol/L (ref 3.5–5.1)
SODIUM: 136 mmol/L (ref 135–145)
Total Bilirubin: 0.6 mg/dL (ref 0.3–1.2)
Total Protein: 6.1 g/dL — ABNORMAL LOW (ref 6.5–8.1)

## 2017-10-10 LAB — CBC
HEMATOCRIT: 37.6 % — AB (ref 39.0–52.0)
Hemoglobin: 11.5 g/dL — ABNORMAL LOW (ref 13.0–17.0)
MCH: 28.7 pg (ref 26.0–34.0)
MCHC: 30.6 g/dL (ref 30.0–36.0)
MCV: 93.8 fL (ref 78.0–100.0)
Platelets: 246 10*3/uL (ref 150–400)
RBC: 4.01 MIL/uL — ABNORMAL LOW (ref 4.22–5.81)
RDW: 15.4 % (ref 11.5–15.5)
WBC: 7.4 10*3/uL (ref 4.0–10.5)

## 2017-10-10 LAB — URINALYSIS, ROUTINE W REFLEX MICROSCOPIC
Bilirubin Urine: NEGATIVE
GLUCOSE, UA: NEGATIVE mg/dL
Hgb urine dipstick: NEGATIVE
KETONES UR: NEGATIVE mg/dL
LEUKOCYTES UA: NEGATIVE
Nitrite: NEGATIVE
PH: 7 (ref 5.0–8.0)
Protein, ur: NEGATIVE mg/dL
Specific Gravity, Urine: 1.009 (ref 1.005–1.030)

## 2017-10-10 LAB — LIPASE, BLOOD: LIPASE: 40 U/L (ref 11–51)

## 2017-10-10 MED ORDER — ACETAMINOPHEN 500 MG PO TABS: 1000.0000 mg | ORAL_TABLET | Freq: Four times a day (QID) | ORAL | 0 refills | Status: DC | PRN

## 2017-10-10 MED ORDER — METOPROLOL TARTRATE 25 MG PO TABS
25.0000 mg | ORAL_TABLET | Freq: Two times a day (BID) | ORAL | Status: DC
  Administered 2017-10-10: 25 mg
  Filled 2017-10-10: qty 1

## 2017-10-10 MED ORDER — IOPAMIDOL (ISOVUE-300) INJECTION 61%
INTRAVENOUS | Status: AC
  Filled 2017-10-10: qty 100

## 2017-10-10 MED ORDER — IOPAMIDOL (ISOVUE-300) INJECTION 61%
100.0000 mL | Freq: Once | INTRAVENOUS | Status: AC
  Administered 2017-10-10: 100 mL via INTRAVENOUS

## 2017-10-10 MED ORDER — MORPHINE SULFATE (PF) 4 MG/ML IV SOLN
4.0000 mg | Freq: Once | INTRAVENOUS | Status: AC
  Administered 2017-10-10: 4 mg via INTRAVENOUS
  Filled 2017-10-10: qty 1

## 2017-10-10 MED ORDER — SODIUM CHLORIDE 0.9 % IV BOLUS
1000.0000 mL | Freq: Once | INTRAVENOUS | Status: AC
  Administered 2017-10-10: 1000 mL via INTRAVENOUS

## 2017-10-10 NOTE — Progress Notes (Signed)
Entered in error

## 2017-10-10 NOTE — ED Provider Notes (Signed)
[facility] EMERGENCY DEPARTMENT Provider Note   CSN: [id number] Arrival date & time: 10/10/17  0542     History   Chief Complaint Chief Complaint  Patient presents with  . Abdominal Pain    HPI [name] is a 84 y.o. male presents for worsening abdominal pain.  He has h/o rate controlled atrial fibrillation with ICD in place,chronic systolic heart failure, not on chronic AC due to SDH, PE, HTN, HLD, asthma, CAD s/p CABG and stents, CML, dysphasia with PEG tube, recent hospitalization on 09/29/2017 -10/06/2017 for PNA, mesenteric venous gas s/p exploratory laparotomy with lysis of adhesions by Dr Donne Hazel on 09/30/17. Abdominal pain began two weeks ago and has persistent since abdominal surgery, worsening, 9/10, constant around belly button.  Associated with headache for weeks, feeling hot, sweats and mild nausea, dry mouth.  Last BM was soft, last night, non melenotic and non bloody.   Denies dysuria, urinary frequency, cough, CP.  Does not know what he is given at nursing facility for pain. Unknown interventions. He is worried the food they put in his PEG tube was bad but states it is the same one he has been given for a long time.  HPI  Past Medical History:  Diagnosis Date  . Abnormal thyroid scan    Abnormal thyroid imaging studies from 11/09/2010, status post ultrasound guided fine needle aspiration of the dominant left inferior thyroid nodule on 12/15/2010. Cytology report showed rare follicular epithelial cells and hemosiderin laden macrophages.  . Adenomatous colon polyp   . AICD (automatic cardioverter/defibrillator) present    a. fx lead; a. s/p lead extraction 03/02/17  . Arthritis    "all over"  . Asthma   . Atrial fibrillation ([place])    on chronic Coumadin; stopped July 2013 due to subdural hematomas  . CHF (congestive heart failure) (HCC)    EF 35-40% s/p most recent ICD generator change-out with Medtronic dual-chamber ICD 05/20/11 with  explantation of previous abdominally-implanted device  . Coronary artery disease    s/p CABG 1983 and PCI/stent 2004.   . Diabetes mellitus    diet controlled  . Diverticulosis   . Dyslipidemia   . Enteritis   . Erythrocytosis   . GERD (gastroesophageal reflux disease)   . Hepatic steatosis   . Hypertension   . Ischemic cardiomyopathy    WITH CHF  . Monocytosis 04/17/2013  . Myocardial infarction ([place]) 1983; ~ 1990  . Pleural effusion    right  . Pneumonia August 2013  . Portal hypertensive gastropathy ([place])   . Rectal ulcer   . Renal calculi   . Subdural hematoma [facility]) July 2013   Anticoagulation stopped.   . SunDown syndrome   . VT (ventricular tachycardia) [facility])     Patient Active Problem List   Diagnosis Date Noted  . Protein-calorie malnutrition, severe 10/04/2017  . Small bowel ischemia ([place]) 09/30/2017  . Chronic pulmonary aspiration 07/31/2017  . H. pylori infection 07/24/2017  . Oral thrush 06/03/2017  . Chronic allergic rhinitis 05/14/2017  . Polyneuropathy 05/14/2017  . COPD (chronic obstructive pulmonary disease) ([place]) 05/02/2017  . Hypertensive heart disease with heart failure ([place]) 04/27/2017  . Adult failure to thrive   . DNR (do not resuscitate)   . Palliative care by specialist   . Malnutrition of moderate degree 04/13/2017  . CKD (chronic kidney disease), stage III ([place]) 04/07/2017  . Thyroid nodule 04/01/2017  . Chronic pulmonary embolism ([place]) 03/27/2017  . Failure of implantable cardioverter-defibrillator (ICD)  lead 03/02/2017  . Chronic constipation 07/13/2016  . ICD (implantable cardioverter-defibrillator) in place 04/08/2016  . Pharyngoesophageal dysphagia 12/31/2013  . Gastroesophageal reflux disease without esophagitis 12/31/2013  . Monocytosis 04/17/2013  . Pleural effusion, right 10/03/2011  . Anemia, iron deficiency 12/29/2010  . Coronary artery disease involving native coronary artery of native heart with angina pectoris ([place])  06/25/2010  . Chronic atrial fibrillation ([place]) 12/30/2009  . Chronic systolic heart failure- EF 35-40% 12/30/2009  . Automatic implantable cardioverter-defibrillator in situ 12/30/2009    Past Surgical History:  Procedure Laterality Date  . CHOLECYSTECTOMY    . COLONOSCOPY  07/07/2011   Procedure: COLONOSCOPY;  Surgeon: Jerene Bears, MD;  Location: [facility];  Service: Gastroenterology;  Laterality: N/A;  . CORONARY ANGIOPLASTY WITH STENT PLACEMENT  2004   Tandem Cypher stents LAD  . CORONARY ARTERY BYPASS GRAFT  1983   SVG-mLAD  . ESOPHAGOGASTRODUODENOSCOPY  02/11/2011   Procedure: ESOPHAGOGASTRODUODENOSCOPY (EGD);  Surgeon: Beryle Beams, MD;  Location: [facility];  Service: Endoscopy;  Laterality: N/A;  . FLEXIBLE SIGMOIDOSCOPY N/A 02/08/2017   Procedure: FLEXIBLE SIGMOIDOSCOPY;  Surgeon: Irene Shipper, MD;  Location: [facility];  Service: Endoscopy;  Laterality: N/A;  . ICD GENERATOR CHANGEOUT N/A 04/08/2016   Procedure: ICD Generator Changeout;  Surgeon: Evans Lance, MD;  [facility];  Service: Cardiovascular;  Laterality: N/A;  . ICD LEAD REMOVAL N/A 03/02/2017   Procedure: ICD LEAD REMOVAL;  Surgeon: Evans Lance, MD;  Location: [facility];  Service: Cardiovascular;  Laterality: N/A;  . IMPLANTABLE CARDIOVERTER DEFIBRILLATOR (ICD) GENERATOR CHANGE N/A 05/20/2011   Procedure: ICD GENERATOR CHANGE;  Surgeon: Evans Lance, MD;  Medtronic secure dual-chamber ICD serial number [id number]   . IR GASTROSTOMY TUBE MOD SED  04/19/2017  . KNEE ARTHROSCOPY     right; "just went in and scraped it"  . LAPAROTOMY N/A 09/30/2017   Procedure: LAPAROTOMY, LYSIS OF ADHESION;  Surgeon: Rolm Bookbinder, MD;  Location: [facility];  Service: General;  Laterality: N/A;  . LEAD INSERTION N/A 03/02/2017   Procedure: LEAD INSERTION;  Surgeon: Evans Lance, MD;  Location: [facility];  Service: Cardiovascular;  Laterality: N/A;  . MASS EXCISION Right 05/10/2013   Procedure: EXCISION MASS  RIGHT THUMB;  Surgeon: Wynonia Sours, MD;  Location: [facility];  Service: Orthopedics;  Laterality: Right;  . PROXIMAL INTERPHALANGEAL FUSION (PIP) Right 05/10/2013   Procedure: DEBRIDEMENT PROXIMAL INTERPHALANGEAL FUSION (PIP);  Surgeon: Wynonia Sours, MD;  Location: [facility];  Service: Orthopedics;  Laterality: Right;  . TONSILLECTOMY              Home Medications    Prior to Admission medications   Medication Sig Start Date End Date Taking? Authorizing Provider  acetaminophen (TYLENOL) 500 MG tablet Take 2 tablets (1,000 mg total) by mouth every 6 (six) hours as needed. 10/10/17   Kinnie Feil, PA-C  ADVAIR DISKUS 250-50 MCG/DOSE AEPB Inhale 1 puff into the lungs 2 (two) times daily.  11/13/14   [provider]  Artificial Saliva (BIOTENE MOISTURIZING MOUTH) SOLN Give 1 swab by mouth every 4 hours while awake 05/04/17   [provider]  digoxin (LANOXIN) 0.05 MG/ML solution Place 2.5 mLs (0.125 mg total) into feeding tube daily. 04/26/17   Eugenie Filler, MD  furosemide (LASIX) 10 MG/ML solution Place 20 mg into feeding tube daily. Give 2 ml (20mg ) via PEG - Tube daily 05/03/17   [provider]  ipratropium (ATROVENT) 0.03 % nasal spray Place 1 spray into both nostrils 3 (three) times daily before meals. 06/02/17   [provider]  ipratropium-albuterol (DUONEB) 0.5-2.5 (3) MG/3ML SOLN Take 3 mLs by nebulization every 4 (four) hours as needed. Patient taking differently: Take 3 mLs by nebulization every 4 (four) hours as needed (SOB).  04/26/17   Eugenie Filler, MD  metoprolol tartrate (LOPRESSOR) 25 MG tablet Place 1 tablet (25 mg total) into feeding tube 2 (two) times daily. 10/06/17   Jill Alexanders, PA-C  nitroGLYCERIN (NITROSTAT) 0.4 MG SL tablet Place 1 tablet (0.4 mg total) under the tongue every 5 (five) minutes as needed for chest pain. 04/26/17   Eugenie Filler, MD  Nutritional Supplements (FEEDING  SUPPLEMENT, JEVITY 1.5 CAL,) LIQD Jevity 1.5 -  Give 240cc  Six times daily 06/27/17   [provider]  omeprazole (PRILOSEC) 20 MG capsule Take 20 mg by mouth daily.  07/04/17   [provider]  ondansetron (ZOFRAN) 4 MG tablet Place 4 mg into feeding tube every 6 (six) hours as needed for nausea or vomiting. 05/10/17   [provider]  polyethylene glycol (MIRALAX / GLYCOLAX) packet Place 17 g into feeding tube 2 (two) times daily.  05/12/17   [provider]  potassium chloride 20 MEQ/15ML (10%) SOLN Place 7.5 mLs (10 mEq total) into feeding tube daily. 04/26/17   Eugenie Filler, MD  RESTASIS 0.05 % ophthalmic emulsion INSTILL 1 DROP INTO BOTH EYES TWICE A DAY 11/17/16   [name], MD  tiotropium (SPIRIVA) 18 MCG inhalation capsule Place 18 mcg into inhaler and inhale daily. 05/03/17   [provider]  traMADol (ULTRAM) 50 MG tablet Take 1-2 tablets (50-100 mg total) by mouth every 6 (six) hours as needed. 10/06/17 10/06/18  [place], Floyce Stakes, PA-C  Water For Irrigation, Sterile (FREE WATER) SOLN Place 120 mLs into feeding tube every 4 (four) hours. 120 ml H2o flushes via tube every 4 hours    [provider]    Family History Family History  Problem Relation Age of Onset  . Tuberculosis Mother   . Tuberculosis Father   . Heart disease Brother   . Diabetes Sister   . Diabetes Brother   . Clotting disorder Brother     Social History Social History   Tobacco Use  . Smoking status: Former Smoker    Packs/day: 2.00    Years: 30.00    Pack years: 60.00    Types: Cigarettes    Last attempt to quit: 06/23/1976    Years since quitting: 41.3  . Smokeless tobacco: Never Used  Substance Use Topics  . Alcohol use: No  . Drug use: No     Allergies   Tamsulosin; Celebrex [celecoxib]; Dronedarone; Esomeprazole magnesium; Digoxin; Hydrocodone-acetaminophen; and Protonix [pantoprazole sodium]   Review of Systems Review of Systems    Constitutional: Positive for diaphoresis.  Gastrointestinal: Positive for abdominal pain and nausea.  Neurological: Positive for headaches.  All other systems reviewed and are negative.    Physical Exam Updated Vital Signs BP 120/77   Pulse 99   Temp 99.7 F (37.6 C) (Rectal)   Resp 16   Ht 6' (1.829 m)   Wt 69 kg   SpO2 96%   BMI 20.63 kg/m   Physical Exam  Constitutional: He is oriented to person, place, and time. He appears well-developed and well-nourished.  Chronically ill appearing but non toxic.   HENT:  Head: Normocephalic and  atraumatic.  Right Ear: External ear normal.  Left Ear: External ear normal.  Nose: Nose normal.  Dry MMM  Eyes: Conjunctivae and EOM are normal.  Neck: Normal range of motion. Neck supple.  Cardiovascular: Normal heart sounds and intact distal pulses.  Irregularly irregular rhythm 110-130s. 1+ pitting edema to tib/fib bilaterally. 1+ radial and DP pulses bilaterally.  Pulmonary/Chest: Effort normal and breath sounds normal.  SpO2 dropped to 88% when I assisted patient to stand up to use urinal, quickly normalized to 100% in seconds  Abdominal: Soft. Bowel sounds are absent. There is tenderness.  Epigastric tenderness. Tenderness along surgical incision. Mild guarding. Soft. Peg tube in LUQ. No surrounding erythema along surgical incision or PEG tube site. Negative Murphy's and McBurney's. No suprapubic or CVA tenderness.   Musculoskeletal: Normal range of motion. He exhibits no deformity.  Neurological: He is alert and oriented to person, place, and time.  Skin: Skin is warm and dry. Capillary refill takes less than 2 seconds.  Psychiatric: He has a normal mood and affect. His behavior is normal. Judgment and thought content normal.  Nursing note and vitals reviewed.    ED Treatments / Results  Labs (all labs ordered are listed, but only abnormal results are displayed) Labs Reviewed  COMPREHENSIVE METABOLIC PANEL - Abnormal; Notable  for the following components:      Result Value   Glucose, Bld 152 (*)    Calcium 8.7 (*)    Total Protein 6.1 (*)    Albumin 3.1 (*)    All other components within normal limits  CBC - Abnormal; Notable for the following components:   RBC 4.01 (*)    Hemoglobin 11.5 (*)    HCT 37.6 (*)    All other components within normal limits  URINE CULTURE  LIPASE, BLOOD  URINALYSIS, ROUTINE W REFLEX MICROSCOPIC    EKG EKG Interpretation  Date/Time:  Tuesday October 10 2017 06:21:47 EDT Ventricular Rate:  119 PR Interval:    QRS Duration: 99 QT Interval:  346 QTC Calculation: 487 R Axis:   125 Text Interpretation:  Atrial fibrillation Ventricular premature complex Right axis deviation Nonspecific T abnormalities, lateral leads Borderline prolonged QT interval Interpretation limited secondary to artifact Confirmed by Fredia Sorrow [phone]) on 10/10/2017 7:17:00 AM Also confirmed by Fredia Sorrow [phone]), editor Philomena Doheny [phone])  on 10/10/2017 9:27:31 AM   Radiology Dg Chest 2 View  Result Date: 10/10/2017 CLINICAL DATA:  Nausea beginning last night. EXAM: CHEST - 2 VIEW COMPARISON:  10/02/2017 FINDINGS: Pacemaker/AICD appears the same. Multiple epicardial leads in place. Previous median sternotomy. Aortic atherosclerosis. Small pleural effusions, slightly enlarged since the previous study. Mild basilar atelectasis. Upper lungs are clear. No frank edema. IMPRESSION: Slight enlargement of bilateral effusions since the study of 8 days ago. Associated basilar atelectasis. Electronically Signed   By: Nelson Chimes M.D.   On: 10/10/2017 07:28   Ct Abdomen Pelvis W Contrast  Result Date: 10/10/2017 CLINICAL DATA:  Worsening periumbilical pain. EXAM: CT ABDOMEN AND PELVIS WITH CONTRAST TECHNIQUE: Multidetector CT imaging of the abdomen and pelvis was performed using the standard protocol following bolus administration of intravenous contrast. CONTRAST:  <See Chart> ISOVUE-300 IOPAMIDOL  (ISOVUE-300) INJECTION 61%, <See Chart> ISOVUE-300 IOPAMIDOL (ISOVUE-300) INJECTION 61% COMPARISON:  09/30/2017 FINDINGS: Lower chest: Interval increase and small bilateral pleural effusions. Pleural and hand/thickening noted posterior right hemithorax. Hepatobiliary: Stable appearance mild biliary distention, left hepatic lobe more than right. No focal abnormality within the liver parenchyma. Gallbladder surgically absent. Common bile  duct diameter 6 mm in the pancreatic head. Pancreas: No focal mass lesion. No dilatation of the main duct. No intraparenchymal cyst. No peripancreatic edema. Spleen: Stable. Adrenals/Urinary Tract: No adrenal nodule or mass. No change 12 mm probable cyst lower pole right kidney. 7 mm nonobstructing stone again identified lower pole left kidney. No evidence for hydroureter. Bladder is distended with tiny gas bubble visible in the bladder lumen. Stomach/Bowel: Stomach is nondistended. No gastric wall thickening. No evidence of outlet obstruction. Gastrostomy tube again noted in stable position. Duodenum is normally positioned as is the ligament of Treitz. No small bowel wall thickening. No small bowel dilatation. The terminal ileum is normal. The The appendix is normal. Diverticular changes are noted in the left colon without evidence of diverticulitis. Vascular/Lymphatic: There is abdominal aortic atherosclerosis without aneurysm. Portal vein and superior mesenteric vein are patent. The celiac axis and SMA are opacified. There is no gastrohepatic or hepatoduodenal ligament lymphadenopathy. No intraperitoneal or retroperitoneal lymphadenopathy. No pelvic sidewall lymphadenopathy. Reproductive: Prostate gland unremarkable. Other: Intraperitoneal free fluid seen on the previous study persists without substantial progression. Most of the fluid is in the anatomic pelvis in tracks bilaterally into inguinal hernias. Musculoskeletal: Diffuse body wall edema again noted. No worrisome lytic or  sclerotic osseous abnormality. Degenerative changes evident in both hips. IMPRESSION: 1. No evidence for bowel obstruction. No bowel wall thickening. The pneumatosis and mesenteric gas seen on the previous exam has resolved in the interval. 2. Similar degree of mild to moderate intraperitoneal free fluid, mainly in the pelvis. 3. Tiny gas bubble in the bladder lumen, likely related to recent instrumentation although bladder infection could have this appearance. 4. Body wall edema 5.  Aortic Atherosclerois (ICD10-170.0) 6. 7 mm nonobstructing stone lower pole left kidney Electronically Signed   By: Misty Stanley M.D.   On: 10/10/2017 09:13    Procedures Procedures (including critical care time)  Medications Ordered in ED Medications  metoprolol tartrate (LOPRESSOR) tablet 25 mg (has no administration in time range)  sodium chloride 0.9 % bolus 1,000 mL (1,000 mLs Intravenous New Bag/Given 10/10/17 0749)  morphine 4 MG/ML injection 4 mg (4 mg Intravenous Given 10/10/17 0749)  iopamidol (ISOVUE-300) 61 % injection 100 mL (100 mLs Intravenous Contrast Given 10/10/17 0842)     Initial Impression / Assessment and Plan / ED Course  I have reviewed the triage vital signs and the nursing notes.  Pertinent labs & imaging results that were available during my care of the patient were reviewed by me and considered in my medical decision making (see chart for details).     84 yo with abdominal pain for almost 2 weeks, worsening.  Recent ex lap with lysis of adhesions for mesenteric venous gas noted on CTAP. Concern for surgical complication vs SBO vs cholecystitis vs pancreatitis. HR controlled in ER.   1030: Work-up is reassuring including CT A/P.  No signs of acute intra-abdominal emergency.  Patient feels much better.  Discussed results with him.  Vital signs remained stable.  Will discharge with acetaminophen as needed.  He has follow-up with general surgery in 3 days for suture removal and recheck.   Discussed return precautions.  Patient is in agreement.  Final Clinical Impressions(s) / ED Diagnoses   Final diagnoses:  Periumbilical abdominal pain    ED Discharge Orders         Ordered    acetaminophen (TYLENOL) 500 MG tablet  Every 6 hours PRN     10/10/17 1024  Kinnie Feil, PA-C 10/10/17 1031    Dorie Rank, MD 10/11/17 1517

## 2017-10-10 NOTE — ED Notes (Signed)
PTAR at bedside 

## 2017-10-10 NOTE — ED Notes (Signed)
[facility] called @ 1048-per Katie, RN-called by Levada Dy

## 2017-10-10 NOTE — ED Notes (Signed)
Patient transported to CT 

## 2017-10-10 NOTE — ED Triage Notes (Signed)
Patient arrived with EMS from [facility] reports central abdominal pain onset last night worsened this morning , denies emesis or diarrhea , recent surgery 09/30/2017 ( Laparotomy -Lysis of adhesion) , denies fever or chills .

## 2017-10-10 NOTE — Discharge Instructions (Addendum)
You were seen in the ER for abdominal pain.  Work up today was reassuring, CT showed improvement since surgery and no complications.  Take [phone] mg acetaminophen every 6-8 hours for abdominal pain.   Follow up with your appointment with general surgery in 3 days  Return for fever greater than 100.71F, vomiting, diarrhea, constipation, worsening abdominal pain

## 2017-10-10 NOTE — ED Provider Notes (Signed)
Medical screening examination/treatment/procedure(s) were conducted as a shared visit with non-physician practitioner(s) and myself.  I personally evaluated the patient during the encounter.  Pt presented with abd pain after recent hospitalization, surgery.  ED workup reassuring.  CT scan without any acute complications.  Suspect sx are related to incisional tenderness which is expected after his surgery.  Stable for discharge.  EKG Interpretation  Date/Time:  Tuesday October 10 2017 06:21:47 EDT Ventricular Rate:  119 PR Interval:    QRS Duration: 99 QT Interval:  346 QTC Calculation: 487 R Axis:   125 Text Interpretation:  Atrial fibrillation Ventricular premature complex Right axis deviation Nonspecific T abnormalities, lateral leads Borderline prolonged QT interval Interpretation limited secondary to artifact Confirmed by Fredia Sorrow [phone]) on 10/10/2017 7:17:00 AM Also confirmed by Fredia Sorrow [phone]), editor Philomena Doheny [phone])  on 10/10/2017 9:27:31 AM     Dorie Rank, MD 10/10/17 1005

## 2017-10-11 ENCOUNTER — Encounter: Payer: Self-pay | Admitting: Adult Health

## 2017-10-11 ENCOUNTER — Non-Acute Institutional Stay (SKILLED_NURSING_FACILITY): Payer: Medicare Other | Admitting: Adult Health

## 2017-10-11 DIAGNOSIS — I110 Hypertensive heart disease with heart failure: Secondary | ICD-10-CM | POA: Diagnosis not present

## 2017-10-11 DIAGNOSIS — K559 Vascular disorder of intestine, unspecified: Secondary | ICD-10-CM

## 2017-10-11 DIAGNOSIS — I25119 Atherosclerotic heart disease of native coronary artery with unspecified angina pectoris: Secondary | ICD-10-CM | POA: Diagnosis not present

## 2017-10-11 DIAGNOSIS — K56600 Partial intestinal obstruction, unspecified as to cause: Secondary | ICD-10-CM | POA: Diagnosis not present

## 2017-10-11 DIAGNOSIS — J309 Allergic rhinitis, unspecified: Secondary | ICD-10-CM

## 2017-10-11 DIAGNOSIS — I2782 Chronic pulmonary embolism: Secondary | ICD-10-CM | POA: Diagnosis not present

## 2017-10-11 DIAGNOSIS — R293 Abnormal posture: Secondary | ICD-10-CM | POA: Diagnosis not present

## 2017-10-11 DIAGNOSIS — I482 Chronic atrial fibrillation, unspecified: Secondary | ICD-10-CM

## 2017-10-11 DIAGNOSIS — R1314 Dysphagia, pharyngoesophageal phase: Secondary | ICD-10-CM | POA: Diagnosis not present

## 2017-10-11 DIAGNOSIS — J449 Chronic obstructive pulmonary disease, unspecified: Secondary | ICD-10-CM | POA: Diagnosis not present

## 2017-10-11 DIAGNOSIS — M6281 Muscle weakness (generalized): Secondary | ICD-10-CM | POA: Diagnosis not present

## 2017-10-11 DIAGNOSIS — K219 Gastro-esophageal reflux disease without esophagitis: Secondary | ICD-10-CM | POA: Diagnosis not present

## 2017-10-11 DIAGNOSIS — R2681 Unsteadiness on feet: Secondary | ICD-10-CM | POA: Diagnosis not present

## 2017-10-11 LAB — URINE CULTURE: CULTURE: NO GROWTH

## 2017-10-11 NOTE — Progress Notes (Signed)
Location:   [facility] Room Number: 205 A Place of Service:  SNF (31)   CODE STATUS: Full Code  Allergies  Allergen Reactions  . Tamsulosin Other (See Comments)    Dizziness, Made BP very low and weakness  . Celebrex [Celecoxib] Hives and Nausea And Vomiting    Gi upset  . Dronedarone Nausea And Vomiting and Other (See Comments)    GI upset, abdominal pain  . Esomeprazole Magnesium Hives and Other (See Comments)    "don't really remember"  . Digoxin Diarrhea    May have caused some diarrhea  . Hydrocodone-Acetaminophen Other (See Comments)    Bad headache  . Protonix [Pantoprazole Sodium] Nausea And Vomiting and Other (See Comments)    Tolerates Dexilant    Chief Complaint  Patient presents with  . Follow up hospitalization         HPI:  He is a 84 year old long term resident of this facility who has been hospitalized from 09-29-17 through 10-06-17 for a small bowel ischemia and pneumonia. On 09-30-17 had a exploratory laparotomy with lysis of adhesions. He has 2 more days of abt left to complete. He does have surgical pain present. He was taken to the ED due to his abdominal pain. He is not to be on narcotics due to his ischemic bowel. His bowels are moving; he denies any diarrhea. He denies any nausea or vomiting. He will continue to be followed for his chronic illnesses including: afib; cad; hypertension. There are no nursing concerns at this time.   Past Medical History:  Diagnosis Date  . Abnormal thyroid scan    Abnormal thyroid imaging studies from 11/09/2010, status post ultrasound guided fine needle aspiration of the dominant left inferior thyroid nodule on 12/15/2010. Cytology report showed rare follicular epithelial cells and hemosiderin laden macrophages.  . Adenomatous colon polyp   . AICD (automatic cardioverter/defibrillator) present    a. fx lead; a. s/p lead extraction 03/02/17  . Arthritis    "all over"  . Asthma   . Atrial fibrillation  ([place])    on chronic Coumadin; stopped July 2013 due to subdural hematomas  . CHF (congestive heart failure) (H[facility])    EF 35-40% s/p most recent ICD generator change-out with Medtronic dual-chamber ICD 05/20/11 with explantation of previous abdominally-implanted device  . Coronary artery disease    s/p CABG 1983 and PCI/stent 2004.   . Diabetes mellitus    diet controlled  . Diverticulosis   . Dyslipidemia   . Enteritis   . Erythrocytosis   . GERD (gastroesophageal reflux disease)   . Hepatic steatosis   . Hypertension   . Ischemic cardiomyopathy    WITH CHF  . Monocytosis 04/17/2013  . Myocardial infarction ([place]) 1983; ~ 1990  . Pleural effusion    right  . Pneumonia August 2013  . Portal hypertensive gastropathy ([place])   . Rectal ulcer   . Renal calculi   . Subdural hematoma [facility]) July 2013   Anticoagulation stopped.   . SunDown syndrome   . VT (ventricular tachycardia) ([place])     Past Surgical History:  Procedure Laterality Date  . CHOLECYSTECTOMY    . COLONOSCOPY  07/07/2011   Procedure: COLONOSCOPY;  Surgeon: Jerene Bears, MD;  Location: [facility];  Service: Gastroenterology;  Laterality: N/A;  . CORONARY ANGIOPLASTY WITH STENT PLACEMENT  2004   Tandem Cypher stents LAD  . CORONARY ARTERY BYPASS GRAFT  1983   SVG-mLAD  . ESOPHAGOGASTRODUODENOSCOPY  02/11/2011  Procedure: ESOPHAGOGASTRODUODENOSCOPY (EGD);  Surgeon: Beryle Beams, MD;  Location: [facility];  Service: Endoscopy;  Laterality: N/A;  . FLEXIBLE SIGMOIDOSCOPY N/A 02/08/2017   Procedure: FLEXIBLE SIGMOIDOSCOPY;  Surgeon: Irene Shipper, MD;  Location: [facility];  Service: Endoscopy;  Laterality: N/A;  . ICD GENERATOR CHANGEOUT N/A 04/08/2016   Procedure: ICD Generator Changeout;  Surgeon: Evans Lance, MD;  Location: [facility];  Service: Cardiovascular;  Laterality: N/A;  . ICD LEAD REMOVAL N/A 03/02/2017   Procedure: ICD LEAD REMOVAL;  Surgeon: Evans Lance, MD;  Location: [facility];  Service:  Cardiovascular;  Laterality: N/A;  . IMPLANTABLE CARDIOVERTER DEFIBRILLATOR (ICD) GENERATOR CHANGE N/A 05/20/2011   Procedure: ICD GENERATOR CHANGE;  Surgeon: Evans Lance, MD;  Medtronic secure dual-chamber ICD serial number [id number]   . IR GASTROSTOMY TUBE MOD SED  04/19/2017  . KNEE ARTHROSCOPY     right; "just went in and scraped it"  . LAPAROTOMY N/A 09/30/2017   Procedure: LAPAROTOMY, LYSIS OF ADHESION;  Surgeon: Rolm Bookbinder, MD;  Location: [place];  Service: General;  Laterality: N/A;  . LEAD INSERTION N/A 03/02/2017   Procedure: LEAD INSERTION;  Surgeon: Evans Lance, MD;  Location: [place];  Service: Cardiovascular;  Laterality: N/A;  . MASS EXCISION Right 05/10/2013   Procedure: EXCISION MASS RIGHT THUMB;  Surgeon: Wynonia Sours, MD;  Location: [facility];  Service: Orthopedics;  Laterality: Right;  . PROXIMAL INTERPHALANGEAL FUSION (PIP) Right 05/10/2013   Procedure: DEBRIDEMENT PROXIMAL INTERPHALANGEAL FUSION (PIP);  Surgeon: Wynonia Sours, MD;  Location: [place];  Service: Orthopedics;  Laterality: Right;  . TONSILLECTOMY          Social History   Socioeconomic History  . Marital status: Divorced    Spouse name: Not on file  . Number of children: 2  . Years of education: Not on file  . Highest education level: Not on file  Occupational History  . Occupation: Sports coach: RETIRED  Social Needs  . Financial resource strain: Not on file  . Food insecurity:    Worry: Not on file    Inability: Not on file  . Transportation needs:    Medical: Not on file    Non-medical: Not on file  Tobacco Use  . Smoking status: Former Smoker    Packs/day: 2.00    Years: 30.00    Pack years: 60.00    Types: Cigarettes    Last attempt to quit: 06/23/1976    Years since quitting: 41.3  . Smokeless tobacco: Never Used  Substance and Sexual Activity  . Alcohol use: No  . Drug use: No  . Sexual activity: Never  Lifestyle  . Physical  activity:    Days per week: Not on file    Minutes per session: Not on file  . Stress: Not on file  Relationships  . Social connections:    Talks on phone: Not on file    Gets together: Not on file    Attends religious service: Not on file    Active member of club or organization: Not on file    Attends meetings of clubs or organizations: Not on file    Relationship status: Not on file  . Intimate partner violence:    Fear of current or ex partner: Not on file    Emotionally abused: Not on file    Physically abused: Not on file    Forced sexual activity: Not  on file  Other Topics Concern  . Not on file  Social History Narrative  . Not on file   Family History  Problem Relation Age of Onset  . Tuberculosis Mother   . Tuberculosis Father   . Heart disease Brother   . Diabetes Sister   . Diabetes Brother   . Clotting disorder Brother       VITAL SIGNS BP (!) 144/82   Pulse 81   Temp (!) 97.3 F (36.3 C)   Resp 18   Ht '6\' 1"'  (1.854 m)   Wt 152 lb 6.4 oz (69.1 kg)   SpO2 91%   BMI 20.11 kg/m   Outpatient Encounter Medications as of 10/11/2017  Medication Sig  . acetaminophen (TYLENOL) 500 MG tablet Give 1-2 tablets by mouth every 6-8 hours for abdominal pain  . ADVAIR DISKUS 250-50 MCG/DOSE AEPB Inhale 1 puff into the lungs 2 (two) times daily.   . Artificial Saliva (BIOTENE MOISTURIZING MOUTH) SOLN Give 1 swab by mouth every 4 hours while awake  . digoxin (LANOXIN) 0.05 MG/ML solution Place 2.5 mLs (0.125 mg total) into feeding tube daily.  . furosemide (LASIX) 10 MG/ML solution Place 20 mg into feeding tube daily. Give 2 ml (75m) via PEG - Tube daily  . ipratropium (ATROVENT) 0.03 % nasal spray Place 1 spray into both nostrils 3 (three) times daily before meals.  .Marland Kitchenipratropium-albuterol (DUONEB) 0.5-2.5 (3) MG/3ML SOLN Take 3 mLs by nebulization every 4 (four) hours as needed.  . metoprolol tartrate (LOPRESSOR) 25 MG tablet Place 1 tablet (25 mg total) into feeding  tube 2 (two) times daily.  . nitroGLYCERIN (NITROSTAT) 0.4 MG SL tablet Place 1 tablet (0.4 mg total) under the tongue every 5 (five) minutes as needed for chest pain.  . Nutritional Supplements (FEEDING SUPPLEMENT, JEVITY 1.5 CAL,) LIQD Jevity 1.5 -  Give 237 ml every 4 hours per peg tube  . omeprazole (PRILOSEC) 20 MG capsule Give 1 capsule via Peg Tube daily  . ondansetron (ZOFRAN) 4 MG tablet Place 4 mg into feeding tube every 6 (six) hours as needed for nausea or vomiting.  . polyethylene glycol (MIRALAX / GLYCOLAX) packet Place 17 g into feeding tube 2 (two) times daily.   . potassium chloride 20 MEQ/15ML (10%) SOLN Place 20 mEq into feeding tube daily.  . RESTASIS 0.05 % ophthalmic emulsion INSTILL 1 DROP INTO BOTH EYES TWICE A DAY  . tiotropium (SPIRIVA) 18 MCG inhalation capsule Place 18 mcg into inhaler and inhale daily.  . traMADol (ULTRAM) 50 MG tablet Take 1-2 tablets (50-100 mg total) by mouth every 6 (six) hours as needed.  . Water For Irrigation, Sterile (FREE WATER) SOLN Place 120 mLs into feeding tube every 4 (four) hours. 120 ml H2o flushes via tube every 4 hours  . [DISCONTINUED] acetaminophen (TYLENOL) 500 MG tablet Take 2 tablets (1,000 mg total) by mouth every 6 (six) hours as needed. (Patient not taking: Reported on 10/11/2017)  . [DISCONTINUED] ipratropium-albuterol (DUONEB) 0.5-2.5 (3) MG/3ML SOLN Take 3 mLs by nebulization every 4 (four) hours as needed. (Patient not taking: Reported on 10/11/2017)  . [DISCONTINUED] potassium chloride 20 MEQ/15ML (10%) SOLN Place 7.5 mLs (10 mEq total) into feeding tube daily. (Patient not taking: Reported on 10/11/2017)   No facility-administered encounter medications on file as of 10/11/2017.      SIGNIFICANT DIAGNOSTIC EXAMS  PREVIOUS:   03-26-17: ct of chest abdomen and pelvis: 1. No acute/traumatic intrathoracic, abdominal, or pelvic pathology. 2. Nonocclusive pulmonary  embolus in the subsegmental right lower lobe pulmonary artery  branches likely old PE or scarring. Nonocclusive acute PE is not entirely excluded. Clinical correlation is recommended. 3. Mild cardiomegaly with dilatation of the right atrium. 4. 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 Small right pleural effusion and stable right lung base round atelectasis/scarring. 5. Aortic Atherosclerosis  and Emphysema    04-07-17: chest x-ray: Suspected patchy infiltrate at the right base, short interval radiographic follow-up suggested to ensure clearing. Cardiomegaly.  04-09-17: chest x-ray: COPD, cardiomegaly, stable. Increasing right basilar airspace opacity and small right effusion concerning for pneumonia  04-10-17: swallow study: Recommend NPO with short term alternative nutrition and continued ST   04-19-17: PEG tube insertion   05-01-17: chest x-ray: mild right basilar infiltrate with trace right sided effusion  05-23-17: chest x-ray: COPD changes with mild atelectasis and small pleural effusion versus pleural thickening at RIGHT base. Improved RIGHT basilar infiltrates since previous exam.  06-19-17: swallow study: Recommend pt continue po and dysphagia treatment with SLP. Once compensation strategies are generalized, suspect pt may tolerate comfort po intake independently. As his strong "hock" is protective of his airway, suspect care plan may be able to be more liberal. Anticipate however that pt will continue to require PEG tube for nutrition.    TODAY:   09-30-17: ct of abdomen and pelvis:1. Severe small-bowel pneumatosis intestinalis with mesenteric venous gas. Resultant small bowel obstruction versus ileus. 2. Small amount of ascites. No abscess. 3. New lingular pneumonia. Stable rounded density RIGHT lung base most compatible with round pneumonia/scarring. New small RIGHT pleural effusion.   10-01-17: chest x-ray: 1. 6 mm thick hyperlucency along the periphery of the right mid lung may reflect a small pneumothorax, less than  5%. 2. Stable cardiomegaly with aortic atherosclerosis left basilar atelectasis. Probable small left effusion and chronic minimal blunting of the right lateral costophrenic angle which may reflect pleural thickening or chronic trace effusion.  10-10-17: chest x-ray: Slight enlargement of bilateral effusions since the study of 8 days ago. Associated basilar atelectasis.  10-10-17: ct of abdomen an pelvis: 1. No evidence for bowel obstruction. No bowel wall thickening. The pneumatosis and mesenteric gas seen on the previous exam has resolved in the interval. 2. Similar degree of mild to moderate intraperitoneal free fluid, mainly in the pelvis. 3. Tiny gas bubble in the bladder lumen, likely related to recent instrumentation although bladder infection could have this appearance. 4. Body wall edema 5.  Aortic Atherosclerois  6. 7 mm nonobstructing stone lower pole left kidney    LABS REVIEWED: PREVIOUS:   04-07-17: wbc 9.5; hgb 13.4; hct 41.9; mcv 94.2; plt 101; glucose 84; bun 18; creat 1.28 ;k+ 4.0; na++ 136; ca 8.2; total bili 2.5; albumin 3.2; rapid flu: +A; blood culture: no growth; urine culture: <10,000 colonies 04-09-17: wbc 9.0; hgb 11.5; hct 35.1; mcv 91.9; plt 120; glucose 88; bun 9; creat 0.86; k+ 3.5; na++ 133; ca 7.5; mag 1.7 04-13-17: wbc 9.4; hgb 13.8; hct 42.6; mcv 94.0; plt 217; glucose 115; bun 11; creat 1.04 ;k+ 3.3; na++144; ca 8.7 04-16-17: wbc 6.5; hgb 12.2; hct 38.5; mcv 95.3; plt 185; glucose 115; bun 11; creat 0.78; k+ 4.0; na++ 143; ca 8.9; mag 1.7  04-20-17:wbc 12.4; hgb 12.2; hct 38.0; mcv 93.6; plt 209; glucose 110; bun 18; creat 0.95; k+ 4.7; na++ 136; ca 8.4; mag 2.7;  04-22-17: mag 1.9 05-02-17: wbc 7.1; hgb 11.8; hct 34.0; mcv 87.6; plt 223; glucose 130; bun 22.2;  creat 0.71; k+ 4.4; na++ 143; ca 9.1; alk phos 123; albumin 3.4; tsh 0.91  06-16-17: wbc 8.7; hgb 13.9; hct 42.2; mcv 91.3; plt 179 07-11-17: H pylori: IgG AB: 0.43; IgA AB: 20.1 (hi): IgM abs:  <9.0  TODAY:  09-29-17: wbc 13.9; hgb 14.4; hct 46.8; mcv 93.8; plt 158; glucose 139; bun 29; creat 1.14; k+ 4.4; na++ 139; ca 9.5 liver normal albumin 4.0  09-30-17: dig level: 1.1 10-02-17: wbc  15.1 hgb 12.9; hc6 42.5; mcv 95.1; plt 151 glucose 95; bun 25; creat 0.88; k+ 4.2; na++ 142; ca 8.5 10-04-17: wbc 8.2; hgb 11.9; hct 38.2; mcv 92.3; plt  149 glucose 125; bun 18; creat 0.71 k+ 3.4; na++142; ca 8.7; mag 1.4  10-10-17: wbc 74; hgb 11.5; hct 37.6; mcv 93.;8 plt 246; glucose 152; bun 13; creat 0.84; k+ 4.8; na++ 136; ca 8.7 liver normal albumin 3.1  Urine culture: no growth    Review of Systems  Constitutional: Negative for malaise/fatigue.  Respiratory: Negative for cough and shortness of breath.   Cardiovascular: Negative for chest pain, palpitations and leg swelling.  Gastrointestinal: Positive for abdominal pain. Negative for constipation and heartburn.  Musculoskeletal: Negative for back pain, joint pain and myalgias.  Skin: Negative.   Neurological: Negative for dizziness.  Psychiatric/Behavioral: The patient is not nervous/anxious.    Physical Exam  Constitutional: He is oriented to person, place, and time. No distress.  Frail   Neck: No thyromegaly present.  Cardiovascular: Normal rate, regular rhythm and intact distal pulses.  Murmur heard. 1/6  Pulmonary/Chest: Effort normal and breath sounds normal. No respiratory distress.  Abdominal: Soft. Bowel sounds are normal. He exhibits no distension. There is tenderness.  Generalized tenderness Peg tube present without signs of infection present Abdominal incisional areas without signs of infection   Musculoskeletal: Normal range of motion. He exhibits no edema.  Lymphadenopathy:    He has no cervical adenopathy.  Neurological: He is alert and oriented to person, place, and time.  Skin: Skin is warm and dry. He is not diaphoretic.  Psychiatric: He has a normal mood and affect.      ASSESSMENT/ PLAN:  TODAY;  1.  Gastroesophageal reflux disease without esophagitis: stable will continue prilosec 20 mg daily    2. Chronic constipation: stable will continue miralax twice daily   3.  Pharyngoesophageal dysphagia: has chronic aspiration: is currently NPO and is receiving jevity tube feeding. For his dry mouth will continue biotene every 4 hours while awake and will monitor his status.  Will continue atrovent nasal spray prior to meals   4. CKD stage III: stable bun 13 creat 0.84  5.  Adult failure to thrive: weight is 152 pounds; albumin is 3.1; without change in status; will continue to monitor  6. History of right lung PE; without change he is not a candidate for anticoagulation therapy due to history of falls and subdural hematoma.     7. Chronic systolic heart failure: EF 35-40%: is status post AICD placement:  is stable will continue lasix 20 mg daily with k+ 20 meq daily  lopressor 25 mg twice daily and will monitor his status.   8. Atrial fibrillation with controlled ventricular response: has history of PE  heart rate stable: is not a candidate for anticoagulations due to history of subdural hematoma: will continue lopressor 25 mg twice daily and digoxin 0.125 mg daily for rate control   9. Coronary artery disease involving native coronary artery with angina pectoris: is status post  CABG/ stent placements: is stable will continue lopressor 25 mg twice daily and has prn ntg.   10. Hypertensive heart disease with heart failure: stable b/p 144/82 will continue lopressor 25 mg twice daily   11. COPD( emphysema): is stable: is now on 02 at 2L/[place] as needed to keep sats >91 %. Will continue advair 250/50 twice daily  spiriva 18 mcg daily duoneb every 4 hours as needed   14. Chronic allergic rhinitis: stable will continue atrovent nasal spray three times daily will monitor   15. Ischemic small bowel is status post exploratory  Laparoscopy: is stable will stop the ultram at this time and will begin tylenol  650 mg every 6 hours    MD is aware of resident's narcotic use and is in agreement with current plan of care. We will attempt to wean resident as apropriate   Ok Edwards NP [facility]  Contact [phone] Monday through Friday 8am- 5pm  After hours call [phone]

## 2017-10-12 ENCOUNTER — Non-Acute Institutional Stay (SKILLED_NURSING_FACILITY): Payer: Medicare Other | Admitting: Internal Medicine

## 2017-10-12 ENCOUNTER — Encounter: Payer: Self-pay | Admitting: Internal Medicine

## 2017-10-12 DIAGNOSIS — I480 Paroxysmal atrial fibrillation: Secondary | ICD-10-CM | POA: Diagnosis not present

## 2017-10-12 DIAGNOSIS — R627 Adult failure to thrive: Secondary | ICD-10-CM | POA: Diagnosis not present

## 2017-10-12 DIAGNOSIS — M6281 Muscle weakness (generalized): Secondary | ICD-10-CM | POA: Diagnosis not present

## 2017-10-12 DIAGNOSIS — Z9889 Other specified postprocedural states: Secondary | ICD-10-CM

## 2017-10-12 DIAGNOSIS — R293 Abnormal posture: Secondary | ICD-10-CM | POA: Diagnosis not present

## 2017-10-12 DIAGNOSIS — J449 Chronic obstructive pulmonary disease, unspecified: Secondary | ICD-10-CM

## 2017-10-12 DIAGNOSIS — K219 Gastro-esophageal reflux disease without esophagitis: Secondary | ICD-10-CM | POA: Diagnosis not present

## 2017-10-12 DIAGNOSIS — R1314 Dysphagia, pharyngoesophageal phase: Secondary | ICD-10-CM | POA: Diagnosis not present

## 2017-10-12 DIAGNOSIS — R2681 Unsteadiness on feet: Secondary | ICD-10-CM | POA: Diagnosis not present

## 2017-10-12 DIAGNOSIS — R600 Localized edema: Secondary | ICD-10-CM | POA: Diagnosis not present

## 2017-10-12 DIAGNOSIS — I5022 Chronic systolic (congestive) heart failure: Secondary | ICD-10-CM | POA: Diagnosis not present

## 2017-10-12 DIAGNOSIS — K56600 Partial intestinal obstruction, unspecified as to cause: Secondary | ICD-10-CM | POA: Diagnosis not present

## 2017-10-12 NOTE — Progress Notes (Signed)
Provider:  DR Arletha Grippe Location:  [facility] [place] Room Number: 205 A Place of Service:  SNF (31)  PCP: [name], MD Patient Care Team: [name], MD as PCP - General (Family Medicine) [name], Peter M, MD as PCP - Cardiology (Cardiology) Heath Lark, MD as Consulting Physician (Hematology and Oncology)  Extended Emergency Contact Information Primary Emergency Contact: Mclouth,Christopher Address: [place] Phone: [phone] Relation: Son Secondary Emergency Contact: Lajoyce Lauber Mobile Phone: [phone] Relation: Daughter  Code Status: Full Code Goals of Care: Advanced Directive information Advanced Directives 10/12/2017  Does Patient Have a Medical Advance Directive? No  Type of Advance Directive -  Does patient want to make changes to medical advance directive? -  Copy of [place] in Chart? -  Would patient like information on creating a medical advance directive? No - Patient declined  Pre-existing out of facility DNR order (yellow form or pink MOST form) -      Chief Complaint  Patient presents with  . Readmit To SNF    Readmission    HPI: Patient is a 84 y.o. male seen today for re-admission to SNF following hospital stay for small bowel ischemia. He presented to the ED from SNF with abdominal pain and associated hematemesis. He underwent exploratory laparotomy on 09/30/17 by Dr Donne Hazel. He presents to SNF for long term care.  Today he reports feeling "lousy". (+) abdominal pain. No N/V. He is total NPO. He gets TF via Peg tube. No d/c from incision. No f/c. He was seen in the ED on 0/03/49 for periumbilical pain. CT A/P neg for acute process and he was sent back to SNF. Nursing c/a right foot swelling. He is a poor historian due to   GERD - stable on prilosec 20 mg daily    Chronic constipation - stable on miralax daily   Dysphagia with chronic aspiration - currently NPO and  is receiving jevity tube feeding; he has dry mouth will continue biotene every 4 hours while awake; atrovent nasal spray prior to meals   Hx CKD - stage 3. Cr 0.84  FTT - stable  Hx right PE - not a candidate for anticoagulation therapy due to history of falls and subdural hematoma.     Chronic systolic heart failure - reduced EF 35-40%l s/p AICD placement; takes lasix 20 mg daily with k+ 10 meq daily;  lopressor 25 mg twice daily  PAF - not a candidate for anticoagulation due to history of subdural hematoma/falls; rate controlled on lopressor 25 mg twice daily and digoxin 0.125 mg daily   Coronary artery disease involving native coronary artery with angina pectoris - s/p CABG and stent placements; stable on lopressor 25 mg twice daily and has prn ntg.   HTN - stable on lopressor 25 mg twice daily; lisinopril 2.5 mg daily  COPD/emphysema - stable on [place] O2 at 2L/min to keep sats >91 %; advair 250/50 twice daily;  spiriva 18 mcg daily; duoneb every 6 hours as needed   Chronic allergic rhinitis - stable on atrovent nasal spray three times daily   Past Medical History:  Diagnosis Date  . Abnormal thyroid scan    Abnormal thyroid imaging studies from 11/09/2010, status post ultrasound guided fine needle aspiration of the dominant left inferior thyroid nodule on 12/15/2010. Cytology report showed rare follicular epithelial cells and hemosiderin laden macrophages.  . Adenomatous colon polyp   . AICD (automatic cardioverter/defibrillator) present  a. fx lead; a. s/p lead extraction 03/02/17  . Arthritis    "all over"  . Asthma   . Atrial fibrillation ([place])    on chronic Coumadin; stopped July 2013 due to subdural hematomas  . CHF (congestive heart failure) (HCC)    EF 35-40% s/p most recent ICD generator change-out with Medtronic dual-chamber ICD 05/20/11 with explantation of previous abdominally-implanted device  . Coronary artery disease    s/p CABG 1983 and PCI/stent 2004.   .  Diabetes mellitus    diet controlled  . Diverticulosis   . Dyslipidemia   . Enteritis   . Erythrocytosis   . GERD (gastroesophageal reflux disease)   . Hepatic steatosis   . Hypertension   . Ischemic cardiomyopathy    WITH CHF  . Monocytosis 04/17/2013  . Myocardial infarction ([place]) 1983; ~ 1990  . Pleural effusion    right  . Pneumonia August 2013  . Portal hypertensive gastropathy ([place])   . Rectal ulcer   . Renal calculi   . Subdural hematoma [facility]) July 2013   Anticoagulation stopped.   . SunDown syndrome   . VT (ventricular tachycardia) ([place])    Past Surgical History:  Procedure Laterality Date  . CHOLECYSTECTOMY    . COLONOSCOPY  07/07/2011   Procedure: COLONOSCOPY;  Surgeon: Jerene Bears, MD;  Location: [facility] ENDOSCOPY;  Service: Gastroenterology;  Laterality: N/A;  . CORONARY ANGIOPLASTY WITH STENT PLACEMENT  2004   Tandem Cypher stents LAD  . CORONARY ARTERY BYPASS GRAFT  1983   SVG-mLAD  . ESOPHAGOGASTRODUODENOSCOPY  02/11/2011   Procedure: ESOPHAGOGASTRODUODENOSCOPY (EGD);  Surgeon: Beryle Beams, MD;  Location: [facility];  Service: Endoscopy;  Laterality: N/A;  . FLEXIBLE SIGMOIDOSCOPY N/A 02/08/2017   Procedure: FLEXIBLE SIGMOIDOSCOPY;  Surgeon: Irene Shipper, MD;  Location: [facility];  Service: Endoscopy;  Laterality: N/A;  . ICD GENERATOR CHANGEOUT N/A 04/08/2016   Procedure: ICD Generator Changeout;  Surgeon: Evans Lance, MD;  Location: [place] CV LAB;  Service: Cardiovascular;  Laterality: N/A;  . ICD LEAD REMOVAL N/A 03/02/2017   Procedure: ICD LEAD REMOVAL;  Surgeon: Evans Lance, MD;  Location: [place];  Service: Cardiovascular;  Laterality: N/A;  . IMPLANTABLE CARDIOVERTER DEFIBRILLATOR (ICD) GENERATOR CHANGE N/A 05/20/2011   Procedure: ICD GENERATOR CHANGE;  Surgeon: Evans Lance, MD;  Medtronic secure dual-chamber ICD serial number [id number]   . IR GASTROSTOMY TUBE MOD SED  04/19/2017  . KNEE ARTHROSCOPY     right; "just went in and scraped it"    . LAPAROTOMY N/A 09/30/2017   Procedure: LAPAROTOMY, LYSIS OF ADHESION;  Surgeon: Rolm Bookbinder, MD;  Location: [place];  Service: General;  Laterality: N/A;  . LEAD INSERTION N/A 03/02/2017   Procedure: LEAD INSERTION;  Surgeon: Evans Lance, MD;  Location: [place];  Service: Cardiovascular;  Laterality: N/A;  . MASS EXCISION Right 05/10/2013   Procedure: EXCISION MASS RIGHT THUMB;  Surgeon: Wynonia Sours, MD;  Location: [place];  Service: Orthopedics;  Laterality: Right;  . PROXIMAL INTERPHALANGEAL FUSION (PIP) Right 05/10/2013   Procedure: DEBRIDEMENT PROXIMAL INTERPHALANGEAL FUSION (PIP);  Surgeon: Wynonia Sours, MD;  Location: [place];  Service: Orthopedics;  Laterality: Right;  . TONSILLECTOMY          reports that he quit smoking about 41 years ago. His smoking use included cigarettes. He has a 60.00 pack-year smoking history. He has never used smokeless tobacco. He reports that he does not drink alcohol  or use drugs. Social History   Socioeconomic History  . Marital status: Divorced    Spouse name: Not on file  . Number of children: 2  . Years of education: Not on file  . Highest education level: Not on file  Occupational History  . Occupation: Sports coach: RETIRED  Social Needs  . Financial resource strain: Not on file  . Food insecurity:    Worry: Not on file    Inability: Not on file  . Transportation needs:    Medical: Not on file    Non-medical: Not on file  Tobacco Use  . Smoking status: Former Smoker    Packs/day: 2.00    Years: 30.00    Pack years: 60.00    Types: Cigarettes    Last attempt to quit: 06/23/1976    Years since quitting: 41.3  . Smokeless tobacco: Never Used  Substance and Sexual Activity  . Alcohol use: No  . Drug use: No  . Sexual activity: Never  Lifestyle  . Physical activity:    Days per week: Not on file    Minutes per session: Not on file  . Stress: Not on file  Relationships  .  Social connections:    Talks on phone: Not on file    Gets together: Not on file    Attends religious service: Not on file    Active member of club or organization: Not on file    Attends meetings of clubs or organizations: Not on file    Relationship status: Not on file  . Intimate partner violence:    Fear of current or ex partner: Not on file    Emotionally abused: Not on file    Physically abused: Not on file    Forced sexual activity: Not on file  Other Topics Concern  . Not on file  Social History Narrative  . Not on file    Functional Status Survey:    Family History  Problem Relation Age of Onset  . Tuberculosis Mother   . Tuberculosis Father   . Heart disease Brother   . Diabetes Sister   . Diabetes Brother   . Clotting disorder Brother     Health Maintenance  Topic Date Due  . URINE MICROALBUMIN  11/10/2017 (Originally 10/09/2014)  . INFLUENZA VACCINE  12/29/2017 (Originally 08/31/2017)  . TETANUS/TDAP  04/02/2027  . PNA vac Low Risk Adult  Discontinued    Allergies  Allergen Reactions  . Tamsulosin Other (See Comments)    Dizziness, Made BP very low and weakness  . Celebrex [Celecoxib] Hives and Nausea And Vomiting    Gi upset  . Dronedarone Nausea And Vomiting and Other (See Comments)    GI upset, abdominal pain  . Esomeprazole Magnesium Hives and Other (See Comments)    "don't really remember"  . Digoxin Diarrhea    May have caused some diarrhea  . Hydrocodone-Acetaminophen Other (See Comments)    Bad headache  . Protonix [Pantoprazole Sodium] Nausea And Vomiting and Other (See Comments)    Tolerates Dexilant    Outpatient Encounter Medications as of 10/12/2017  Medication Sig  . acetaminophen (TYLENOL) 325 MG tablet Give 2 tablets via PEG - tube every 6 hours for stomach pain  . ADVAIR DISKUS 250-50 MCG/DOSE AEPB Inhale 1 puff into the lungs 2 (two) times daily.   . Artificial Saliva (BIOTENE MOISTURIZING MOUTH) SOLN Give 1 swab by mouth every 4  hours while awake  . digoxin (LANOXIN)  0.05 MG/ML solution Place 2.5 mLs (0.125 mg total) into feeding tube daily.  . furosemide (LASIX) 10 MG/ML solution Place 20 mg into feeding tube daily. Give 2 ml (20mg ) via PEG - Tube daily  . ipratropium (ATROVENT) 0.03 % nasal spray Place 1 spray into both nostrils 3 (three) times daily before meals.  [place] Kitchen ipratropium-albuterol (DUONEB) 0.5-2.5 (3) MG/3ML SOLN Take 3 mLs by nebulization every 4 (four) hours as needed.  . metoprolol tartrate (LOPRESSOR) 25 MG tablet Place 1 tablet (25 mg total) into feeding tube 2 (two) times daily.  . nitroGLYCERIN (NITROSTAT) 0.4 MG SL tablet Place 1 tablet (0.4 mg total) under the tongue every 5 (five) minutes as needed for chest pain.  . Nutritional Supplements (FEEDING SUPPLEMENT, JEVITY 1.5 CAL,) LIQD Jevity 1.5 -  Give 237 ml every 4 hours per peg tube  . omeprazole (PRILOSEC) 20 MG capsule Give 1 capsule via Peg Tube daily  . ondansetron (ZOFRAN) 4 MG tablet Place 4 mg into feeding tube every 6 (six) hours as needed for nausea or vomiting.  . polyethylene glycol (MIRALAX / GLYCOLAX) packet Place 17 g into feeding tube 2 (two) times daily.   . potassium chloride 20 MEQ/15ML (10%) SOLN Place 20 mEq into feeding tube daily.  . RESTASIS 0.05 % ophthalmic emulsion INSTILL 1 DROP INTO BOTH EYES TWICE A DAY  . tiotropium (SPIRIVA) 18 MCG inhalation capsule Place 18 mcg into inhaler and inhale daily.  . Water For Irrigation, Sterile (FREE WATER) SOLN Place 120 mLs into feeding tube every 4 (four) hours. 120 ml H2o flushes via tube every 4 hours  . [DISCONTINUED] traMADol (ULTRAM) 50 MG tablet Take 1-2 tablets (50-100 mg total) by mouth every 6 (six) hours as needed. (Patient not taking: Reported on 10/12/2017)   No facility-administered encounter medications on file as of 10/12/2017.     Review of Systems  HENT: Positive for trouble swallowing.   Gastrointestinal: Positive for abdominal pain.  All other systems reviewed and  are negative.   Vitals:   10/12/17 0930  BP: 122/68  Pulse: 94  Resp: 20  Temp: 97.9 F (36.6 C)  SpO2: 98%  Weight: 147 lb 14.4 oz (67.1 kg)  Height: 6\' 1"  (1.854 m)   Body mass index is 19.51 kg/m. Physical Exam  Constitutional: He is oriented to person, place, and time. He appears well-developed and well-nourished.  Frail appearing sitting in recliner at bedside in NAD  HENT:  Mouth/Throat: Oropharynx is clear and moist.  MM dry; no oral thrush  Eyes: Pupils are equal, round, and reactive to light. No scleral icterus.  Neck: Neck supple. Carotid bruit is present (b/l systolic). No thyromegaly present.  Cardiovascular: Normal rate, regular rhythm and intact distal pulses. Exam reveals no gallop and no friction rub.  Murmur (1/6 SEM) heard. +1 pitting R>LLE edema. No calf TTP  Pulmonary/Chest: Effort normal and breath sounds normal. He has no wheezes. He has no rales. He exhibits no tenderness.  Poor inspiratory effort  Abdominal: Soft. Bowel sounds are normal. He exhibits no distension, no abdominal bruit, no pulsatile midline mass and no mass. There is no hepatomegaly. There is tenderness (adjacent to Peg site). There is no rebound and no guarding.    Musculoskeletal: He exhibits edema.  Lymphadenopathy:    He has no cervical adenopathy.  Neurological: He is alert and oriented to person, place, and time. He has normal reflexes.  Skin: Skin is warm and dry. Rash (left ear lobe but no d/c or  vesicular formation; NT) noted.  Psychiatric: He has a normal mood and affect. His behavior is normal. Judgment and thought content normal.    Labs reviewed: Basic Metabolic Panel: Recent Labs    10/03/17 0423 10/04/17 0551 10/05/17 0516 10/10/17 0556  NA 144 142 144 136  K 4.8 3.4* 4.0 4.8  CL 111 113* 113* 100  CO2 23 21* 24 24  GLUCOSE 107* 125* 109* 152*  BUN 23 18 18 13   CREATININE 0.77 0.71 0.70 0.84  CALCIUM 8.5* 8.7* 8.4* 8.7*  MG 1.8 1.4* 1.9  --    Liver  Function Tests: Recent Labs    04/07/17 0236 05/02/17 09/29/17 2217 10/10/17 0556  AST 33 17 20 26   ALT 16* 8* 9 15  ALKPHOS 50 132* 76 98  BILITOT 2.5*  --  1.5* 0.6  PROT 6.2*  --  7.4 6.1*  ALBUMIN 3.2*  --  4.0 3.1*   Recent Labs    03/26/17 1858 09/29/17 2217 10/10/17 0556  LIPASE 28 26 40   No results for input(s): AMMONIA in the last 8760 hours. CBC: Recent Labs    05/02/17 06/16/17 0931  10/01/17 1535  10/03/17 0423 10/04/17 0551 10/10/17 0556  WBC 7.1 8.7   < > 15.1*   < > 9.3 8.2 7.4  NEUTROABS 4 3.4  --  10.1*  --   --   --   --   HGB 11.8* 13.9   < > 12.6*   < > 12.1* 11.9* 11.5*  HCT 34* 42.2   < > 40.9   < > 38.5* 38.2* 37.6*  MCV  --  91.3   < > 94.7   < > 94.6 92.3 93.8  PLT 223 179   < > 126*   < > 142* 149* 246   < > = values in this interval not displayed.   Cardiac Enzymes: Recent Labs    04/01/17 1209  04/17/17 2035 04/18/17 0206 04/18/17 0744  CKTOTAL 262  --   --   --   --   TROPONINI  --    < > <0.03 <0.03 <0.03   < > = values in this interval not displayed.   BNP: Invalid input(s): POCBNP Lab Results  Component Value Date   HGBA1C 5.6 08/18/2011   Lab Results  Component Value Date   TSH 0.91 05/02/2017   Lab Results  Component Value Date   VITAMINB12 686 06/28/2010   Lab Results  Component Value Date   FOLATE  06/28/2010    >20.0 (NOTE)  Reference Ranges        Deficient:       0.4 - 3.3 ng/mL        Indeterminate:   3.4 - 5.4 ng/mL        Normal:              > 5.4 ng/mL   Lab Results  Component Value Date   IRON 59 10/04/2012   TIBC 310 10/04/2012   FERRITIN 30 04/16/2014    Imaging and Procedures obtained prior to SNF admission: Dg Chest 2 View  Result Date: 10/10/2017 CLINICAL DATA:  Nausea beginning last night. EXAM: CHEST - 2 VIEW COMPARISON:  10/02/2017 FINDINGS: Pacemaker/AICD appears the same. Multiple epicardial leads in place. Previous median sternotomy. Aortic atherosclerosis. Small pleural effusions,  slightly enlarged since the previous study. Mild basilar atelectasis. Upper lungs are clear. No frank edema. IMPRESSION: Slight enlargement of bilateral effusions since the study of 8  days ago. Associated basilar atelectasis. Electronically Signed   By: Nelson Chimes M.D.   On: 10/10/2017 07:28   Ct Abdomen Pelvis W Contrast  Result Date: 10/10/2017 CLINICAL DATA:  Worsening periumbilical pain. EXAM: CT ABDOMEN AND PELVIS WITH CONTRAST TECHNIQUE: Multidetector CT imaging of the abdomen and pelvis was performed using the standard protocol following bolus administration of intravenous contrast. CONTRAST:  <See Chart> ISOVUE-300 IOPAMIDOL (ISOVUE-300) INJECTION 61%, <See Chart> ISOVUE-300 IOPAMIDOL (ISOVUE-300) INJECTION 61% COMPARISON:  09/30/2017 FINDINGS: Lower chest: Interval increase and small bilateral pleural effusions. Pleural and hand/thickening noted posterior right hemithorax. Hepatobiliary: Stable appearance mild biliary distention, left hepatic lobe more than right. No focal abnormality within the liver parenchyma. Gallbladder surgically absent. Common bile duct diameter 6 mm in the pancreatic head. Pancreas: No focal mass lesion. No dilatation of the main duct. No intraparenchymal cyst. No peripancreatic edema. Spleen: Stable. Adrenals/Urinary Tract: No adrenal nodule or mass. No change 12 mm probable cyst lower pole right kidney. 7 mm nonobstructing stone again identified lower pole left kidney. No evidence for hydroureter. Bladder is distended with tiny gas bubble visible in the bladder lumen. Stomach/Bowel: Stomach is nondistended. No gastric wall thickening. No evidence of outlet obstruction. Gastrostomy tube again noted in stable position. Duodenum is normally positioned as is the ligament of Treitz. No small bowel wall thickening. No small bowel dilatation. The terminal ileum is normal. The The appendix is normal. Diverticular changes are noted in the left colon without evidence of  diverticulitis. Vascular/Lymphatic: There is abdominal aortic atherosclerosis without aneurysm. Portal vein and superior mesenteric vein are patent. The celiac axis and SMA are opacified. There is no gastrohepatic or hepatoduodenal ligament lymphadenopathy. No intraperitoneal or retroperitoneal lymphadenopathy. No pelvic sidewall lymphadenopathy. Reproductive: Prostate gland unremarkable. Other: Intraperitoneal free fluid seen on the previous study persists without substantial progression. Most of the fluid is in the anatomic pelvis in tracks bilaterally into inguinal hernias. Musculoskeletal: Diffuse body wall edema again noted. No worrisome lytic or sclerotic osseous abnormality. Degenerative changes evident in both hips. IMPRESSION: 1. No evidence for bowel obstruction. No bowel wall thickening. The pneumatosis and mesenteric gas seen on the previous exam has resolved in the interval. 2. Similar degree of mild to moderate intraperitoneal free fluid, mainly in the pelvis. 3. Tiny gas bubble in the bladder lumen, likely related to recent instrumentation although bladder infection could have this appearance. 4. Body wall edema 5.  Aortic Atherosclerois (ICD10-170.0) 6. 7 mm nonobstructing stone lower pole left kidney Electronically Signed   By: Misty Stanley M.D.   On: 10/10/2017 09:13    Assessment/Plan   ICD-10-CM   1. S/P exploratory laparotomy Z98.890    2/2 small bowel ischemia  2. Adult failure to thrive R62.7   3. Chronic obstructive pulmonary disease, unspecified COPD type ([place]) J44.9   4. PAF (paroxysmal atrial fibrillation) (HCC) I48.0   5. Chronic systolic heart failure- EF 35-40% I50.22   6. Bilateral lower extremity edema R60.0    Cont current meds as ordered  PT/OT/ST as indicated  Wound care as ordered  Maintain NPO status; TF as ordered  Peg tube care as indicated  F/u with specialists as scheduled  GOAL: short term rehab then continue long term care. Communicated with pt and  nursing.  Will follow  Labs/tests ordered: none    Arlyss Weathersby S. Perlie Gold  [facility] and [facility] [address] [place], [place] [address] [phone] Cell (Monday-Friday 8 AM - 5  PM) [phone] After 5 PM and follow prompts

## 2017-10-13 DIAGNOSIS — K56600 Partial intestinal obstruction, unspecified as to cause: Secondary | ICD-10-CM | POA: Diagnosis not present

## 2017-10-13 DIAGNOSIS — R293 Abnormal posture: Secondary | ICD-10-CM | POA: Diagnosis not present

## 2017-10-13 DIAGNOSIS — R1314 Dysphagia, pharyngoesophageal phase: Secondary | ICD-10-CM | POA: Diagnosis not present

## 2017-10-13 DIAGNOSIS — M6281 Muscle weakness (generalized): Secondary | ICD-10-CM | POA: Diagnosis not present

## 2017-10-13 DIAGNOSIS — K219 Gastro-esophageal reflux disease without esophagitis: Secondary | ICD-10-CM | POA: Diagnosis not present

## 2017-10-13 DIAGNOSIS — R2681 Unsteadiness on feet: Secondary | ICD-10-CM | POA: Diagnosis not present

## 2017-10-14 DIAGNOSIS — R293 Abnormal posture: Secondary | ICD-10-CM | POA: Diagnosis not present

## 2017-10-14 DIAGNOSIS — R2681 Unsteadiness on feet: Secondary | ICD-10-CM | POA: Diagnosis not present

## 2017-10-14 DIAGNOSIS — K219 Gastro-esophageal reflux disease without esophagitis: Secondary | ICD-10-CM | POA: Diagnosis not present

## 2017-10-14 DIAGNOSIS — M6281 Muscle weakness (generalized): Secondary | ICD-10-CM | POA: Diagnosis not present

## 2017-10-14 DIAGNOSIS — R1314 Dysphagia, pharyngoesophageal phase: Secondary | ICD-10-CM | POA: Diagnosis not present

## 2017-10-14 DIAGNOSIS — K56600 Partial intestinal obstruction, unspecified as to cause: Secondary | ICD-10-CM | POA: Diagnosis not present

## 2017-10-15 DIAGNOSIS — R2681 Unsteadiness on feet: Secondary | ICD-10-CM | POA: Diagnosis not present

## 2017-10-15 DIAGNOSIS — M6281 Muscle weakness (generalized): Secondary | ICD-10-CM | POA: Diagnosis not present

## 2017-10-15 DIAGNOSIS — K56600 Partial intestinal obstruction, unspecified as to cause: Secondary | ICD-10-CM | POA: Diagnosis not present

## 2017-10-15 DIAGNOSIS — R293 Abnormal posture: Secondary | ICD-10-CM | POA: Diagnosis not present

## 2017-10-15 DIAGNOSIS — K219 Gastro-esophageal reflux disease without esophagitis: Secondary | ICD-10-CM | POA: Diagnosis not present

## 2017-10-15 DIAGNOSIS — R1314 Dysphagia, pharyngoesophageal phase: Secondary | ICD-10-CM | POA: Diagnosis not present

## 2017-10-16 ENCOUNTER — Non-Acute Institutional Stay (SKILLED_NURSING_FACILITY): Payer: Medicare Other

## 2017-10-16 DIAGNOSIS — R1314 Dysphagia, pharyngoesophageal phase: Secondary | ICD-10-CM | POA: Diagnosis not present

## 2017-10-16 DIAGNOSIS — F4321 Adjustment disorder with depressed mood: Secondary | ICD-10-CM | POA: Diagnosis not present

## 2017-10-16 DIAGNOSIS — R2681 Unsteadiness on feet: Secondary | ICD-10-CM | POA: Diagnosis not present

## 2017-10-16 DIAGNOSIS — R293 Abnormal posture: Secondary | ICD-10-CM | POA: Diagnosis not present

## 2017-10-16 DIAGNOSIS — Z0000 Encounter for general adult medical examination without abnormal findings: Secondary | ICD-10-CM | POA: Diagnosis not present

## 2017-10-16 DIAGNOSIS — M6281 Muscle weakness (generalized): Secondary | ICD-10-CM | POA: Diagnosis not present

## 2017-10-16 DIAGNOSIS — K56600 Partial intestinal obstruction, unspecified as to cause: Secondary | ICD-10-CM | POA: Diagnosis not present

## 2017-10-16 DIAGNOSIS — K219 Gastro-esophageal reflux disease without esophagitis: Secondary | ICD-10-CM | POA: Diagnosis not present

## 2017-10-16 NOTE — Patient Instructions (Addendum)
[name] , Thank you for taking time to come for your Medicare Wellness Visit. I appreciate your ongoing commitment to your health goals. Please review the following plan we discussed and let me know if I can assist you in the future.   Screening recommendations/referrals: Colonoscopy excluded, over age 82 Recommended yearly ophthalmology/optometry visit for glaucoma screening and checkup Recommended yearly dental visit for hygiene and checkup  Vaccinations: Influenza vaccine due, will receive at [facility] Pneumococcal vaccine excluded Tdap vaccine up to date, due 04/02/2027 Shingles vaccine not in past records    Advanced directives: Need a copy for chart  Conditions/risks identified: none  Next appointment: Dr. Eulas Post makes rounds  Preventive Care 34 Years and Older, Male Preventive care refers to lifestyle choices and visits with your health care provider that can promote health and wellness. What does preventive care include?  A yearly physical exam. This is also called an annual well check.  Dental exams once or twice a year.  Routine eye exams. Ask your health care provider how often you should have your eyes checked.  Personal lifestyle choices, including:  Daily care of your teeth and gums.  Regular physical activity.  Eating a healthy diet.  Avoiding tobacco and drug use.  Limiting alcohol use.  Practicing safe sex.  Taking low doses of aspirin every day.  Taking vitamin and mineral supplements as recommended by your health care provider. What happens during an annual well check? The services and screenings done by your health care provider during your annual well check will depend on your age, overall health, lifestyle risk factors, and family history of disease. Counseling  Your health care provider may ask you questions about your:  Alcohol use.  Tobacco use.  Drug use.  Emotional well-being.  Home and relationship well-being.  Sexual  activity.  Eating habits.  History of falls.  Memory and ability to understand (cognition).  Work and work Statistician. Screening  You may have the following tests or measurements:  Height, weight, and BMI.  Blood pressure.  Lipid and cholesterol levels. These may be checked every 5 years, or more frequently if you are over 3 years old.  Skin check.  Lung cancer screening. You may have this screening every year starting at age 93 if you have a 30-pack-year history of smoking and currently smoke or have quit within the past 15 years.  Fecal occult blood test (FOBT) of the stool. You may have this test every year starting at age 37.  Flexible sigmoidoscopy or colonoscopy. You may have a sigmoidoscopy every 5 years or a colonoscopy every 10 years starting at age 36.  Prostate cancer screening. Recommendations will vary depending on your family history and other risks.  Hepatitis C blood test.  Hepatitis B blood test.  Sexually transmitted disease (STD) testing.  Diabetes screening. This is done by checking your blood sugar (glucose) after you have not eaten for a while (fasting). You may have this done every 1-3 years.  Abdominal aortic aneurysm (AAA) screening. You may need this if you are a current or former smoker.  Osteoporosis. You may be screened starting at age 21 if you are at high risk. Talk with your health care provider about your test results, treatment options, and if necessary, the need for more tests. Vaccines  Your health care provider may recommend certain vaccines, such as:  Influenza vaccine. This is recommended every year.  Tetanus, diphtheria, and acellular pertussis (Tdap, Td) vaccine. You may need a Td  booster every 10 years.  Zoster vaccine. You may need this after age 55.  Pneumococcal 13-valent conjugate (PCV13) vaccine. One dose is recommended after age 47.  Pneumococcal polysaccharide (PPSV23) vaccine. One dose is recommended after age  39. Talk to your health care provider about which screenings and vaccines you need and how often you need them. This information is not intended to replace advice given to you by your health care provider. Make sure you discuss any questions you have with your health care provider. Document Released: 02/13/2015 Document Revised: 10/07/2015 Document Reviewed: 11/18/2014 Elsevier Interactive Patient Education  2017 [place] Prevention in the Home Falls can cause injuries. They can happen to people of all ages. There are many things you can do to make your home safe and to help prevent falls. What can I do on the outside of my home?  Regularly fix the edges of walkways and driveways and fix any cracks.  Remove anything that might make you trip as you walk through a door, such as a raised step or threshold.  Trim any bushes or trees on the path to your home.  Use bright outdoor lighting.  Clear any walking paths of anything that might make someone trip, such as rocks or tools.  Regularly check to see if handrails are loose or broken. Make sure that both sides of any steps have handrails.  Any raised decks and porches should have guardrails on the edges.  Have any leaves, snow, or ice cleared regularly.  Use sand or salt on walking paths during winter.  Clean up any spills in your garage right away. This includes oil or grease spills. What can I do in the bathroom?  Use night lights.  Install grab bars by the toilet and in the tub and shower. Do not use towel bars as grab bars.  Use non-skid mats or decals in the tub or shower.  If you need to sit down in the shower, use a plastic, non-slip stool.  Keep the floor dry. Clean up any water that spills on the floor as soon as it happens.  Remove soap buildup in the tub or shower regularly.  Attach bath mats securely with double-sided non-slip rug tape.  Do not have throw rugs and other things on the floor that can make  you trip. What can I do in the bedroom?  Use night lights.  Make sure that you have a light by your bed that is easy to reach.  Do not use any sheets or blankets that are too big for your bed. They should not hang down onto the floor.  Have a firm chair that has side arms. You can use this for support while you get dressed.  Do not have throw rugs and other things on the floor that can make you trip. What can I do in the kitchen?  Clean up any spills right away.  Avoid walking on wet floors.  Keep items that you use a lot in easy-to-reach places.  If you need to reach something above you, use a strong step stool that has a grab bar.  Keep electrical cords out of the way.  Do not use floor polish or wax that makes floors slippery. If you must use wax, use non-skid floor wax.  Do not have throw rugs and other things on the floor that can make you trip. What can I do with my stairs?  Do not leave any items on the stairs.  Make sure that there are handrails on both sides of the stairs and use them. Fix handrails that are broken or loose. Make sure that handrails are as long as the stairways.  Check any carpeting to make sure that it is firmly attached to the stairs. Fix any carpet that is loose or worn.  Avoid having throw rugs at the top or bottom of the stairs. If you do have throw rugs, attach them to the floor with carpet tape.  Make sure that you have a light switch at the top of the stairs and the bottom of the stairs. If you do not have them, ask someone to add them for you. What else can I do to help prevent falls?  Wear shoes that:  Do not have high heels.  Have rubber bottoms.  Are comfortable and fit you well.  Are closed at the toe. Do not wear sandals.  If you use a stepladder:  Make sure that it is fully opened. Do not climb a closed stepladder.  Make sure that both sides of the stepladder are locked into place.  Ask someone to hold it for you, if  possible.  Clearly mark and make sure that you can see:  Any grab bars or handrails.  First and last steps.  Where the edge of each step is.  Use tools that help you move around (mobility aids) if they are needed. These include:  Canes.  Walkers.  Scooters.  Crutches.  Turn on the lights when you go into a dark area. Replace any light bulbs as soon as they burn out.  Set up your furniture so you have a clear path. Avoid moving your furniture around.  If any of your floors are uneven, fix them.  If there are any pets around you, be aware of where they are.  Review your medicines with your doctor. Some medicines can make you feel dizzy. This can increase your chance of falling. Ask your doctor what other things that you can do to help prevent falls. This information is not intended to replace advice given to you by your health care provider. Make sure you discuss any questions you have with your health care provider. Document Released: 11/13/2008 Document Revised: 06/25/2015 Document Reviewed: 02/21/2014 Elsevier Interactive Patient Education  2017 [employer].

## 2017-10-16 NOTE — Progress Notes (Signed)
Subjective:   [name] is a 84 y.o. male who presents for Medicare Annual/Subsequent preventive examination at [place]  Last AWV-12/08/2015    Objective:    Vitals: BP 126/65 (BP Location: Left Arm, Patient Position: Sitting)   Pulse 75   Temp 98 F (36.7 C) (Oral)   Ht 6\' 1"  (1.854 m)   Wt 147 lb (66.7 kg)   BMI 19.39 kg/m   Body mass index is 19.39 kg/m.  Advanced Directives 10/16/2017 10/12/2017 10/11/2017 10/10/2017 10/02/2017 09/29/2017 09/28/2017  Does Patient Have a Medical Advance Directive? No No No No - No No  Type of Advance Directive - - - - - - -  Does patient want to make changes to medical advance directive? - - - - - - -  Copy of Secor in Chart? - - - - - - -  Would patient like information on creating a medical advance directive? No - Patient declined No - Patient declined No - Patient declined - No - Patient declined - No - Patient declined  Pre-existing out of facility DNR order (yellow form or pink MOST form) - - - - - - -    Tobacco Social History   Tobacco Use  Smoking Status Former Smoker  . Packs/day: 2.00  . Years: 30.00  . Pack years: 60.00  . Types: Cigarettes  . Last attempt to quit: 06/23/1976  . Years since quitting: 41.3  Smokeless Tobacco Never Used     Counseling given: Not Answered   Clinical Intake:  Pre-visit preparation completed: No  Pain : 0-10 Pain Score: 3  Pain Type: Chronic pain Pain Location: Abdomen Pain Descriptors / Indicators: Aching Pain Onset: More than a month ago Pain Frequency: Intermittent     Nutritional Risks: None Diabetes: No  How often do you need to have someone help you when you read instructions, pamphlets, or other written materials from your doctor or pharmacy?: 2 - Rarely  Interpreter Needed?: No  Information entered by :: Tyson Dense, RN  Past Medical History:  Diagnosis Date  . Abnormal thyroid scan    Abnormal thyroid imaging studies  from 11/09/2010, status post ultrasound guided fine needle aspiration of the dominant left inferior thyroid nodule on 12/15/2010. Cytology report showed rare follicular epithelial cells and hemosiderin laden macrophages.  . Adenomatous colon polyp   . AICD (automatic cardioverter/defibrillator) present    a. fx lead; a. s/p lead extraction 03/02/17  . Arthritis    "all over"  . Asthma   . Atrial fibrillation ([place])    on chronic Coumadin; stopped July 2013 due to subdural hematomas  . CHF (congestive heart failure) (HCC)    EF 35-40% s/p most recent ICD generator change-out with Medtronic dual-chamber ICD 05/20/11 with explantation of previous abdominally-implanted device  . Coronary artery disease    s/p CABG 1983 and PCI/stent 2004.   . Diabetes mellitus    diet controlled  . Diverticulosis   . Dyslipidemia   . Enteritis   . Erythrocytosis   . GERD (gastroesophageal reflux disease)   . Hepatic steatosis   . Hypertension   . Ischemic cardiomyopathy    WITH CHF  . Monocytosis 04/17/2013  . Myocardial infarction ([place]) 1983; ~ 1990  . Pleural effusion    right  . Pneumonia August 2013  . Portal hypertensive gastropathy ([place])   . Rectal ulcer   . Renal calculi   . Subdural hematoma [facility]) July 2013  Anticoagulation stopped.   . SunDown syndrome   . VT (ventricular tachycardia) ([place])    Past Surgical History:  Procedure Laterality Date  . CHOLECYSTECTOMY    . COLONOSCOPY  07/07/2011   Procedure: COLONOSCOPY;  Surgeon: Jerene Bears, MD;  Location: [facility] ENDOSCOPY;  Service: Gastroenterology;  Laterality: N/A;  . CORONARY ANGIOPLASTY WITH STENT PLACEMENT  2004   Tandem Cypher stents LAD  . CORONARY ARTERY BYPASS GRAFT  1983   SVG-mLAD  . ESOPHAGOGASTRODUODENOSCOPY  02/11/2011   Procedure: ESOPHAGOGASTRODUODENOSCOPY (EGD);  Surgeon: Beryle Beams, MD;  Location: [facility];  Service: Endoscopy;  Laterality: N/A;  . FLEXIBLE SIGMOIDOSCOPY N/A 02/08/2017   Procedure: FLEXIBLE  SIGMOIDOSCOPY;  Surgeon: Irene Shipper, MD;  Location: [facility] END[facility];  Service: Endoscopy;  Laterality: N/A;  . ICD GENERATOR CHANGEOUT N/A 04/08/2016   Procedure: ICD Generator Changeout;  Surgeon: Evans Lance, MD;  Location: [place] CV LAB;  Service: Cardiovascular;  Laterality: N/A;  . ICD LEAD REMOVAL N/A 03/02/2017   Procedure: ICD LEAD REMOVAL;  Surgeon: Evans Lance, MD;  Location: [place];  Service: Cardiovascular;  Laterality: N/A;  . IMPLANTABLE CARDIOVERTER DEFIBRILLATOR (ICD) GENERATOR CHANGE N/A 05/20/2011   Procedure: ICD GENERATOR CHANGE;  Surgeon: Evans Lance, MD;  Medtronic secure dual-chamber ICD serial number [id number]   . IR GASTROSTOMY TUBE MOD SED  04/19/2017  . KNEE ARTHROSCOPY     right; "just went in and scraped it"  . LAPAROTOMY N/A 09/30/2017   Procedure: LAPAROTOMY, LYSIS OF ADHESION;  Surgeon: Rolm Bookbinder, MD;  Location: [place];  Service: General;  Laterality: N/A;  . LEAD INSERTION N/A 03/02/2017   Procedure: LEAD INSERTION;  Surgeon: Evans Lance, MD;  Location: [place];  Service: Cardiovascular;  Laterality: N/A;  . MASS EXCISION Right 05/10/2013   Procedure: EXCISION MASS RIGHT THUMB;  Surgeon: Wynonia Sours, MD;  Location: [place];  Service: Orthopedics;  Laterality: Right;  . PROXIMAL INTERPHALANGEAL FUSION (PIP) Right 05/10/2013   Procedure: DEBRIDEMENT PROXIMAL INTERPHALANGEAL FUSION (PIP);  Surgeon: Wynonia Sours, MD;  Location: [place];  Service: Orthopedics;  Laterality: Right;  . TONSILLECTOMY         Family History  Problem Relation Age of Onset  . Tuberculosis Mother   . Tuberculosis Father   . Heart disease Brother   . Diabetes Sister   . Diabetes Brother   . Clotting disorder Brother    Social History   Socioeconomic History  . Marital status: Divorced    Spouse name: Not on file  . Number of children: 2  . Years of education: Not on file  . Highest education level: Not on file  Occupational  History  . Occupation: Sports coach: RETIRED  Social Needs  . Financial resource strain: Not hard at all  . Food insecurity:    Worry: Never true    Inability: Never true  . Transportation needs:    Medical: No    Non-medical: No  Tobacco Use  . Smoking status: Former Smoker    Packs/day: 2.00    Years: 30.00    Pack years: 60.00    Types: Cigarettes    Last attempt to quit: 06/23/1976    Years since quitting: 41.3  . Smokeless tobacco: Never Used  Substance and Sexual Activity  . Alcohol use: No  . Drug use: No  . Sexual activity: Never  Lifestyle  . Physical activity:    Days per  week: 7 days    Minutes per session: 60 min  . Stress: Not at all  Relationships  . Social connections:    Talks on phone: Once a week    Gets together: Once a week    Attends religious service: Never    Active member of club or organization: No    Attends meetings of clubs or organizations: Never    Relationship status: Divorced  Other Topics Concern  . Not on file  Social History Narrative  . Not on file    Outpatient Encounter Medications as of 10/16/2017  Medication Sig  . acetaminophen (TYLENOL) 325 MG tablet Give 2 tablets via PEG - tube every 6 hours for stomach pain  . ADVAIR DISKUS 250-50 MCG/DOSE AEPB Inhale 1 puff into the lungs 2 (two) times daily.   . Artificial Saliva (BIOTENE MOISTURIZING MOUTH) SOLN Give 1 swab by mouth every 4 hours while awake  . digoxin (LANOXIN) 0.05 MG/ML solution Place 2.5 mLs (0.125 mg total) into feeding tube daily.  . furosemide (LASIX) 10 MG/ML solution Place 20 mg into feeding tube daily. Give 2 ml (20mg ) via PEG - Tube daily  . ipratropium (ATROVENT) 0.03 % nasal spray Place 1 spray into both nostrils 3 (three) times daily before meals.  Marland Kitchen ipratropium-albuterol (DUONEB) 0.5-2.5 (3) MG/3ML SOLN Take 3 mLs by nebulization every 4 (four) hours as needed.  . metoprolol tartrate (LOPRESSOR) 25 MG tablet Place 1 tablet (25 mg total) into  feeding tube 2 (two) times daily.  . nitroGLYCERIN (NITROSTAT) 0.4 MG SL tablet Place 1 tablet (0.4 mg total) under the tongue every 5 (five) minutes as needed for chest pain.  . Nutritional Supplements (FEEDING SUPPLEMENT, JEVITY 1.5 CAL,) LIQD Jevity 1.5 -  Give 237 ml every 4 hours per peg tube  . omeprazole (PRILOSEC) 20 MG capsule Give 1 capsule via Peg Tube daily  . ondansetron (ZOFRAN) 4 MG tablet Place 4 mg into feeding tube every 6 (six) hours as needed for nausea or vomiting.  . polyethylene glycol (MIRALAX / GLYCOLAX) packet Place 17 g into feeding tube 2 (two) times daily.   . potassium chloride 20 MEQ/15ML (10%) SOLN Place 20 mEq into feeding tube daily.  . RESTASIS 0.05 % ophthalmic emulsion INSTILL 1 DROP INTO BOTH EYES TWICE A DAY  . tiotropium (SPIRIVA) 18 MCG inhalation capsule Place 18 mcg into inhaler and inhale daily.  . Water For Irrigation, Sterile (FREE WATER) SOLN Place 120 mLs into feeding tube every 4 (four) hours. 120 ml H2o flushes via tube every 4 hours   No facility-administered encounter medications on file as of 10/16/2017.     Activities of Daily Living In your present state of health, do you have any difficulty performing the following activities: 10/16/2017 09/30/2017  Hearing? Tempie Donning  Vision? N N  Difficulty concentrating or making decisions? N N  Walking or climbing stairs? Y Y  Dressing or bathing? Y Y  Doing errands, shopping? Tempie Donning  Preparing Food and eating ? Y -  Using the Toilet? N -  In the past six months, have you accidently leaked urine? Y -  Do you have problems with loss of bowel control? Y -  Managing your Medications? Y -  Managing your Finances? Y -  Housekeeping or managing your Housekeeping? Y -  Some recent data might be hidden    Patient Care Team: [name], MD as PCP - General (Family Medicine) [name], Peter M, MD as PCP -  Cardiology (Cardiology) Heath Lark, MD as Consulting Physician (Hematology and Oncology)   Assessment:     This is a routine wellness examination for Gideon.  Exercise Activities and Dietary recommendations Current Exercise Habits: Structured exercise class, Type of exercise: Other - see comments(PT ), Time (Minutes): 60, Frequency (Times/Week): 7, Weekly Exercise (Minutes/Week): 420, Intensity: Mild, Exercise limited by: None identified  Goals   None     Fall Risk Fall Risk  10/16/2017 05/02/2017 03/09/2017 12/20/2016  Falls in the past year? Yes Yes Yes No  Number falls in past yr: 1 2 or more 1 -  Injury with Fall? No Yes No -  Risk Factor Category  - High Fall Risk - -  Risk for fall due to : - History of fall(s);Impaired balance/gait;Impaired mobility History of fall(s);Impaired balance/gait;Impaired mobility -  Follow up - Education provided;Falls prevention discussed Falls prevention discussed -   Is the patient's home free of loose throw rugs in walkways, pet beds, electrical cords, etc?   yes      Grab bars in the bathroom? yes      Handrails on the stairs?   yes      Adequate lighting?   yes  Depression Screen PHQ 2/9 Scores 10/16/2017 05/02/2017 03/09/2017 12/20/2016  PHQ - 2 Score 0 0 0 0    Cognitive Function     6CIT Screen 10/16/2017  What Year? 0 points  What month? 0 points  What time? 0 points  Count back from 20 0 points  Months in reverse 0 points  Repeat phrase 2 points  Total Score 2    Immunization History  Administered Date(s) Administered  . Influenza, High Dose Seasonal PF 10/05/2016  . Influenza-Unspecified 10/12/2011  . PPD Test 08/23/2017, 09/11/2017  . Tdap 12/20/2016, 04/01/2017    Qualifies for Shingles Vaccine? Not in past records  Screening Tests Health Maintenance  Topic Date Due  . URINE MICROALBUMIN  11/10/2017 (Originally 10/09/2014)  . INFLUENZA VACCINE  12/29/2017 (Originally 08/31/2017)  . TETANUS/TDAP  04/02/2027  . PNA vac Low Risk Adult  Discontinued   Cancer Screenings: Lung: Low Dose CT Chest recommended if Age 34-80 years, 30  pack-year currently smoking OR have quit w/in 15years. Patient does not qualify. Colorectal: up to date  Additional Screenings:  Hepatitis C Screening:declined Flu vaccine due: will receive at [facility]:  I have personally reviewed and addressed the Medicare Annual Wellness questionnaire and have noted the following in the patient's chart:  A. Medical and social history B. Use of alcohol, tobacco or illicit drugs  C. Current medications and supplements D. Functional ability and status E.  Nutritional status F.  Physical activity G. Advance directives H. List of other physicians I.  Hospitalizations, surgeries, and ER visits in previous 12 months J.  [place] to include hearing, vision, cognitive, depression L. Referrals and appointments - none  In addition, I have reviewed and discussed with patient certain preventive protocols, quality metrics, and best practice recommendations. A written personalized care plan for preventive services as well as general preventive health recommendations were provided to patient.  See attached scanned questionnaire for additional information.   Signed,   Tyson Dense, RN Nurse Health Advisor  Patient Concerns: None

## 2017-10-17 DIAGNOSIS — R1314 Dysphagia, pharyngoesophageal phase: Secondary | ICD-10-CM | POA: Diagnosis not present

## 2017-10-17 DIAGNOSIS — R2681 Unsteadiness on feet: Secondary | ICD-10-CM | POA: Diagnosis not present

## 2017-10-17 DIAGNOSIS — K219 Gastro-esophageal reflux disease without esophagitis: Secondary | ICD-10-CM | POA: Diagnosis not present

## 2017-10-17 DIAGNOSIS — K56600 Partial intestinal obstruction, unspecified as to cause: Secondary | ICD-10-CM | POA: Diagnosis not present

## 2017-10-17 DIAGNOSIS — M6281 Muscle weakness (generalized): Secondary | ICD-10-CM | POA: Diagnosis not present

## 2017-10-17 DIAGNOSIS — R293 Abnormal posture: Secondary | ICD-10-CM | POA: Diagnosis not present

## 2017-10-18 DIAGNOSIS — M6281 Muscle weakness (generalized): Secondary | ICD-10-CM | POA: Diagnosis not present

## 2017-10-18 DIAGNOSIS — K56600 Partial intestinal obstruction, unspecified as to cause: Secondary | ICD-10-CM | POA: Diagnosis not present

## 2017-10-18 DIAGNOSIS — R2681 Unsteadiness on feet: Secondary | ICD-10-CM | POA: Diagnosis not present

## 2017-10-18 DIAGNOSIS — R1314 Dysphagia, pharyngoesophageal phase: Secondary | ICD-10-CM | POA: Diagnosis not present

## 2017-10-18 DIAGNOSIS — K219 Gastro-esophageal reflux disease without esophagitis: Secondary | ICD-10-CM | POA: Diagnosis not present

## 2017-10-18 DIAGNOSIS — R293 Abnormal posture: Secondary | ICD-10-CM | POA: Diagnosis not present

## 2017-10-19 DIAGNOSIS — M6281 Muscle weakness (generalized): Secondary | ICD-10-CM | POA: Diagnosis not present

## 2017-10-19 DIAGNOSIS — G3184 Mild cognitive impairment, so stated: Secondary | ICD-10-CM | POA: Diagnosis not present

## 2017-10-19 DIAGNOSIS — R2681 Unsteadiness on feet: Secondary | ICD-10-CM | POA: Diagnosis not present

## 2017-10-19 DIAGNOSIS — R1314 Dysphagia, pharyngoesophageal phase: Secondary | ICD-10-CM | POA: Diagnosis not present

## 2017-10-19 DIAGNOSIS — R293 Abnormal posture: Secondary | ICD-10-CM | POA: Diagnosis not present

## 2017-10-19 DIAGNOSIS — K219 Gastro-esophageal reflux disease without esophagitis: Secondary | ICD-10-CM | POA: Diagnosis not present

## 2017-10-19 DIAGNOSIS — K56600 Partial intestinal obstruction, unspecified as to cause: Secondary | ICD-10-CM | POA: Diagnosis not present

## 2017-10-20 ENCOUNTER — Encounter: Payer: Self-pay | Admitting: Adult Health

## 2017-10-20 ENCOUNTER — Non-Acute Institutional Stay (SKILLED_NURSING_FACILITY): Payer: Medicare Other | Admitting: Adult Health

## 2017-10-20 DIAGNOSIS — I2782 Chronic pulmonary embolism: Secondary | ICD-10-CM

## 2017-10-20 DIAGNOSIS — R627 Adult failure to thrive: Secondary | ICD-10-CM

## 2017-10-20 DIAGNOSIS — I482 Chronic atrial fibrillation, unspecified: Secondary | ICD-10-CM

## 2017-10-20 DIAGNOSIS — K56600 Partial intestinal obstruction, unspecified as to cause: Secondary | ICD-10-CM | POA: Diagnosis not present

## 2017-10-20 DIAGNOSIS — R2681 Unsteadiness on feet: Secondary | ICD-10-CM | POA: Diagnosis not present

## 2017-10-20 DIAGNOSIS — K219 Gastro-esophageal reflux disease without esophagitis: Secondary | ICD-10-CM | POA: Diagnosis not present

## 2017-10-20 DIAGNOSIS — R293 Abnormal posture: Secondary | ICD-10-CM | POA: Diagnosis not present

## 2017-10-20 DIAGNOSIS — I5022 Chronic systolic (congestive) heart failure: Secondary | ICD-10-CM | POA: Diagnosis not present

## 2017-10-20 DIAGNOSIS — R1314 Dysphagia, pharyngoesophageal phase: Secondary | ICD-10-CM | POA: Diagnosis not present

## 2017-10-20 DIAGNOSIS — M6281 Muscle weakness (generalized): Secondary | ICD-10-CM | POA: Diagnosis not present

## 2017-10-20 NOTE — Progress Notes (Signed)
Location:   Pharmacologist of Service:  SNF ([id number])   CODE STATUS: full code   Allergies  Allergen Reactions  . Tamsulosin Other (See Comments)    Dizziness, Made BP very low and weakness  . Celebrex [Celecoxib] Hives and Nausea And Vomiting    Gi upset  . Dronedarone Nausea And Vomiting and Other (See Comments)    GI upset, abdominal pain  . Esomeprazole Magnesium Hives and Other (See Comments)    "don't really remember"  . Digoxin Diarrhea    May have caused some diarrhea  . Hydrocodone-Acetaminophen Other (See Comments)    Bad headache  . Protonix [Pantoprazole Sodium] Nausea And Vomiting and Other (See Comments)    Tolerates Dexilant    Chief Complaint  Patient presents with  . Medical Management of Chronic Issues    Afib; PE; systolic heart failure; failure to thrive. Weekly follow up for the first 30 days post hospitalization     HPI:  He is a 84 year old long term resident of this facility being seen for the management of his chronic illnesses: afib; PE; systolic heart failure; failure to thrive. He does have abdominal pain; is tolerating tube feeding without dificulty; no anxiety no insomnia. There are no nursing concerns at this time.   Past Medical History:  Diagnosis Date  . Abnormal thyroid scan    Abnormal thyroid imaging studies from 11/09/2010, status post ultrasound guided fine needle aspiration of the dominant left inferior thyroid nodule on 12/15/2010. Cytology report showed rare follicular epithelial cells and hemosiderin laden macrophages.  . Adenomatous colon polyp   . AICD (automatic cardioverter/defibrillator) present    a. fx lead; a. s/p lead extraction 03/02/17  . Arthritis    "all over"  . Asthma   . Atrial fibrillation ([place])    on chronic Coumadin; stopped July 2013 due to subdural hematomas  . CHF (congestive heart failure) (HCC)    EF 35-40% s/p most recent ICD generator change-out with Medtronic dual-chamber ICD 05/20/11 with  explantation of previous abdominally-implanted device  . Coronary artery disease    s/p CABG 1983 and PCI/stent 2004.   . Diabetes mellitus    diet controlled  . Diverticulosis   . Dyslipidemia   . Enteritis   . Erythrocytosis   . GERD (gastroesophageal reflux disease)   . Hepatic steatosis   . Hypertension   . Ischemic cardiomyopathy    WITH CHF  . Monocytosis 04/17/2013  . Myocardial infarction ([place]) 1983; ~ 1990  . Pleural effusion    right  . Pneumonia August 2013  . Portal hypertensive gastropathy ([place])   . Rectal ulcer   . Renal calculi   . Subdural hematoma [facility]) July 2013   Anticoagulation stopped.   . SunDown syndrome   . VT (ventricular tachycardia) ([place])     Past Surgical History:  Procedure Laterality Date  . CHOLECYSTECTOMY    . COLONOSCOPY  07/07/2011   Procedure: COLONOSCOPY;  Surgeon: Jerene Bears, MD;  Location: [facility] END[facility];  Service: Gastroenterology;  Laterality: N/A;  . CORONARY ANGIOPLASTY WITH STENT PLACEMENT  2004   Tandem Cypher stents LAD  . CORONARY ARTERY BYPASS GRAFT  1983   SVG-mLAD  . ESOPHAGOGASTRODUODENOSCOPY  02/11/2011   Procedure: ESOPHAGOGASTRODUODENOSCOPY (EGD);  Surgeon: Beryle Beams, MD;  Location: [facility];  Service: Endoscopy;  Laterality: N/A;  . FLEXIBLE SIGMOIDOSCOPY N/A 02/08/2017   Procedure: FLEXIBLE SIGMOIDOSCOPY;  Surgeon: Irene Shipper, MD;  Location: [facility];  Service: Endoscopy;  Laterality: N/A;  . ICD GENERATOR CHANGEOUT N/A 04/08/2016   Procedure: ICD Generator Changeout;  Surgeon: Evans Lance, MD;  Location: [place] CV LAB;  Service: Cardiovascular;  Laterality: N/A;  . ICD LEAD REMOVAL N/A 03/02/2017   Procedure: ICD LEAD REMOVAL;  Surgeon: Evans Lance, MD;  Location: [place];  Service: Cardiovascular;  Laterality: N/A;  . IMPLANTABLE CARDIOVERTER DEFIBRILLATOR (ICD) GENERATOR CHANGE N/A 05/20/2011   Procedure: ICD GENERATOR CHANGE;  Surgeon: Evans Lance, MD;  Medtronic secure dual-chamber ICD serial  number [id number]   . IR GASTROSTOMY TUBE MOD SED  04/19/2017  . KNEE ARTHROSCOPY     right; "just went in and scraped it"  . LAPAROTOMY N/A 09/30/2017   Procedure: LAPAROTOMY, LYSIS OF ADHESION;  Surgeon: Rolm Bookbinder, MD;  Location: [place];  Service: General;  Laterality: N/A;  . LEAD INSERTION N/A 03/02/2017   Procedure: LEAD INSERTION;  Surgeon: Evans Lance, MD;  Location: [place];  Service: Cardiovascular;  Laterality: N/A;  . MASS EXCISION Right 05/10/2013   Procedure: EXCISION MASS RIGHT THUMB;  Surgeon: Wynonia Sours, MD;  Location: [place];  Service: Orthopedics;  Laterality: Right;  . PROXIMAL INTERPHALANGEAL FUSION (PIP) Right 05/10/2013   Procedure: DEBRIDEMENT PROXIMAL INTERPHALANGEAL FUSION (PIP);  Surgeon: Wynonia Sours, MD;  Location: [place];  Service: Orthopedics;  Laterality: Right;  . TONSILLECTOMY          Social History   Socioeconomic History  . Marital status: Divorced    Spouse name: Not on file  . Number of children: 2  . Years of education: Not on file  . Highest education level: Not on file  Occupational History  . Occupation: Sports coach: RETIRED  Social Needs  . Financial resource strain: Not hard at all  . Food insecurity:    Worry: Never true    Inability: Never true  . Transportation needs:    Medical: No    Non-medical: No  Tobacco Use  . Smoking status: Former Smoker    Packs/day: 2.00    Years: 30.00    Pack years: 60.00    Types: Cigarettes    Last attempt to quit: 06/23/1976    Years since quitting: 41.3  . Smokeless tobacco: Never Used  Substance and Sexual Activity  . Alcohol use: No  . Drug use: No  . Sexual activity: Never  Lifestyle  . Physical activity:    Days per week: 7 days    Minutes per session: 60 min  . Stress: Not at all  Relationships  . Social connections:    Talks on phone: Once a week    Gets together: Once a week    Attends religious service: Never     Active member of club or organization: No    Attends meetings of clubs or organizations: Never    Relationship status: Divorced  . Intimate partner violence:    Fear of current or ex partner: No    Emotionally abused: No    Physically abused: No    Forced sexual activity: No  Other Topics Concern  . Not on file  Social History Narrative  . Not on file   Family History  Problem Relation Age of Onset  . Tuberculosis Mother   . Tuberculosis Father   . Heart disease Brother   . Diabetes Sister   . Diabetes Brother   . Clotting disorder Brother  VITAL SIGNS BP 122/68   Pulse 94   Temp 97.9 F (36.6 C)   Resp 18   Ht '6\' 1"'  (1.854 m)   Wt 146 lb (66.2 kg)   SpO2 98%   BMI 19.26 kg/m   Outpatient Encounter Medications as of 10/20/2017  Medication Sig  . acetaminophen (TYLENOL) 325 MG tablet Give 2 tablets via PEG - tube every 6 hours for stomach pain  . ADVAIR DISKUS 250-50 MCG/DOSE AEPB Inhale 1 puff into the lungs 2 (two) times daily.   . Artificial Saliva (BIOTENE MOISTURIZING MOUTH) SOLN Give 1 swab by mouth every 4 hours while awake  . digoxin (LANOXIN) 0.05 MG/ML solution Place 2.5 mLs (0.125 mg total) into feeding tube daily.  . furosemide (LASIX) 10 MG/ML solution Place 20 mg into feeding tube daily. Give 2 ml (46m) via PEG - Tube daily  . ipratropium (ATROVENT) 0.03 % nasal spray Place 1 spray into both nostrils 3 (three) times daily before meals.  .Marland Kitchenipratropium-albuterol (DUONEB) 0.5-2.5 (3) MG/3ML SOLN Take 3 mLs by nebulization every 4 (four) hours as needed.  . metoprolol tartrate (LOPRESSOR) 25 MG tablet Place 1 tablet (25 mg total) into feeding tube 2 (two) times daily.  . nitroGLYCERIN (NITROSTAT) 0.4 MG SL tablet Place 1 tablet (0.4 mg total) under the tongue every 5 (five) minutes as needed for chest pain.  . Nutritional Supplements (FEEDING SUPPLEMENT, JEVITY 1.5 CAL,) LIQD Jevity 1.5 -  Give 237 ml every 4 hours per peg tube  . omeprazole (PRILOSEC)  20 MG capsule Give 1 capsule via Peg Tube daily  . ondansetron (ZOFRAN) 4 MG tablet Place 4 mg into feeding tube every 6 (six) hours as needed for nausea or vomiting.  . polyethylene glycol (MIRALAX / GLYCOLAX) packet Place 17 g into feeding tube 2 (two) times daily.   . potassium chloride 20 MEQ/15ML (10%) SOLN Place 20 mEq into feeding tube daily.  . RESTASIS 0.05 % ophthalmic emulsion INSTILL 1 DROP INTO BOTH EYES TWICE A DAY  . tiotropium (SPIRIVA) 18 MCG inhalation capsule Place 18 mcg into inhaler and inhale daily.  . Water For Irrigation, Sterile (FREE WATER) SOLN Place 120 mLs into feeding tube every 4 (four) hours. 120 ml H2o flushes via tube every 4 hours   No facility-administered encounter medications on file as of 10/20/2017.      SIGNIFICANT DIAGNOSTIC EXAMS   PREVIOUS:   03-26-17: ct of chest abdomen and pelvis: 1. No acute/traumatic intrathoracic, abdominal, or pelvic pathology. 2. Nonocclusive pulmonary embolus in the subsegmental right lower lobe pulmonary artery branches likely old PE or scarring. Nonocclusive acute PE is not entirely excluded. Clinical correlation is recommended. 3. Mild cardiomegaly with dilatation of the right atrium. 4. 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 Small right pleural effusion and stable right lung base round atelectasis/scarring. 5. Aortic Atherosclerosis  and Emphysema    04-07-17: chest x-ray: Suspected patchy infiltrate at the right base, short interval radiographic follow-up suggested to ensure clearing. Cardiomegaly.  04-09-17: chest x-ray: COPD, cardiomegaly, stable. Increasing right basilar airspace opacity and small right effusion concerning for pneumonia  04-10-17: swallow study: Recommend NPO with short term alternative nutrition and continued ST   04-19-17: PEG tube insertion   05-01-17: chest x-ray: mild right basilar infiltrate with trace right sided effusion  05-23-17: chest x-ray: COPD changes with  mild atelectasis and small pleural effusion versus pleural thickening at RIGHT base. Improved RIGHT basilar infiltrates since previous exam.  06-19-17: swallow  study: Recommend pt continue po and dysphagia treatment with SLP. Once compensation strategies are generalized, suspect pt may tolerate comfort po intake independently. As his strong "hock" is protective of his airway, suspect care plan may be able to be more liberal. Anticipate however that pt will continue to require PEG tube for nutrition.   09-30-17: ct of abdomen and pelvis:1. Severe small-bowel pneumatosis intestinalis with mesenteric venous gas. Resultant small bowel obstruction versus ileus. 2. Small amount of ascites. No abscess. 3. New lingular pneumonia. Stable rounded density RIGHT lung base most compatible with round pneumonia/scarring. New small RIGHT pleural effusion.   10-01-17: chest x-ray: 1. 6 mm thick hyperlucency along the periphery of the right mid lung may reflect a small pneumothorax, less than 5%. 2. Stable cardiomegaly with aortic atherosclerosis left basilar atelectasis. Probable small left effusion and chronic minimal blunting of the right lateral costophrenic angle which may reflect pleural thickening or chronic trace effusion.  10-10-17: chest x-ray: Slight enlargement of bilateral effusions since the study of 8 days ago. Associated basilar atelectasis.  10-10-17: ct of abdomen an pelvis: 1. No evidence for bowel obstruction. No bowel wall thickening. The pneumatosis and mesenteric gas seen on the previous exam has resolved in the interval. 2. Similar degree of mild to moderate intraperitoneal free fluid, mainly in the pelvis. 3. Tiny gas bubble in the bladder lumen, likely related to recent instrumentation although bladder infection could have this appearance. 4. Body wall edema 5.  Aortic Atherosclerois  6. 7 mm nonobstructing stone lower pole left kidney  NO NEW EXAMS.     LABS REVIEWED: PREVIOUS:   04-07-17:  wbc 9.5; hgb 13.4; hct 41.9; mcv 94.2; plt 101; glucose 84; bun 18; creat 1.28 ;k+ 4.0; na++ 136; ca 8.2; total bili 2.5; albumin 3.2; rapid flu: +A; blood culture: no growth; urine culture: <10,000 colonies 04-09-17: wbc 9.0; hgb 11.5; hct 35.1; mcv 91.9; plt 120; glucose 88; bun 9; creat 0.86; k+ 3.5; na++ 133; ca 7.5; mag 1.7 04-13-17: wbc 9.4; hgb 13.8; hct 42.6; mcv 94.0; plt 217; glucose 115; bun 11; creat 1.04 ;k+ 3.3; na++144; ca 8.7 04-16-17: wbc 6.5; hgb 12.2; hct 38.5; mcv 95.3; plt 185; glucose 115; bun 11; creat 0.78; k+ 4.0; na++ 143; ca 8.9; mag 1.7  04-20-17:wbc 12.4; hgb 12.2; hct 38.0; mcv 93.6; plt 209; glucose 110; bun 18; creat 0.95; k+ 4.7; na++ 136; ca 8.4; mag 2.7;  04-22-17: mag 1.9 05-02-17: wbc 7.1; hgb 11.8; hct 34.0; mcv 87.6; plt 223; glucose 130; bun 22.2; creat 0.71; k+ 4.4; na++ 143; ca 9.1; alk phos 123; albumin 3.4; tsh 0.91  06-16-17: wbc 8.7; hgb 13.9; hct 42.2; mcv 91.3; plt 179 07-11-17: H pylori: IgG AB: 0.43; IgA AB: 20.1 (hi): IgM abs: <9.0 09-29-17: wbc 13.9; hgb 14.4; hct 46.8; mcv 93.8; plt 158; glucose 139; bun 29; creat 1.14; k+ 4.4; na++ 139; ca 9.5 liver normal albumin 4.0  09-30-17: dig level: 1.1 10-02-17: wbc  15.1 hgb 12.9; hc6 42.5; mcv 95.1; plt 151 glucose 95; bun 25; creat 0.88; k+ 4.2; na++ 142; ca 8.5 10-04-17: wbc 8.2; hgb 11.9; hct 38.2; mcv 92.3; plt  149 glucose 125; bun 18; creat 0.71 k+ 3.4; na++142; ca 8.7; mag 1.4  10-10-17: wbc 74; hgb 11.5; hct 37.6; mcv 93.;8 plt 246; glucose 152; bun 13; creat 0.84; k+ 4.8; na++ 136; ca 8.7 liver normal albumin 3.1  Urine culture: no growth   NO NEW LABS.    Review of Systems  Constitutional:  Negative for malaise/fatigue.  Respiratory: Negative for cough and shortness of breath.   Cardiovascular: Negative for chest pain, palpitations and leg swelling.  Gastrointestinal: Positive for abdominal pain. Negative for constipation and heartburn.  Musculoskeletal: Negative for back pain, joint pain and myalgias.    Skin: Negative.   Neurological: Negative for dizziness.  Psychiatric/Behavioral: The patient is not nervous/anxious.        Physical Exam  Constitutional: He is oriented to person, place, and time. No distress.  Frail   Neck: No thyromegaly present.  Cardiovascular: Normal rate, regular rhythm and intact distal pulses.  Murmur heard. 1/6  Pulmonary/Chest: Effort normal and breath sounds normal. No respiratory distress.  Abdominal: Soft. Bowel sounds are normal. He exhibits no distension. There is tenderness.  Peg tube present without signs of infection present.   Musculoskeletal: Normal range of motion. He exhibits no edema.  Lymphadenopathy:    He has no cervical adenopathy.  Neurological: He is alert and oriented to person, place, and time.  Skin: Skin is warm and dry. He is not diaphoretic.  Psychiatric: He has a normal mood and affect.      ASSESSMENT/ PLAN:  TODAY;  1.  Adult failure to thrive: weight is 152 pounds; albumin is 3.1; without change in status; will continue to monitor  2. History of right lung PE; without change he is not a candidate for anticoagulation therapy due to history of falls and subdural hematoma.     3. Chronic systolic heart failure: EF 35-40%: is status post AICD placement:  is stable will continue lasix 20 mg daily with k+ 20 meq daily  lopressor 25 mg twice daily and will monitor his status.   4. Atrial fibrillation with controlled ventricular response: has history of PE  heart rate stable: is not a candidate for anticoagulations due to history of subdural hematoma: will continue lopressor 25 mg twice daily and digoxin 0.125 mg daily for rate control   PREVIOUS  5. Coronary artery disease involving native coronary artery with angina pectoris: is status post CABG/ stent placements: is stable will continue lopressor 25 mg twice daily and has prn ntg.   6. Hypertensive heart disease with heart failure: stable b/p 144/82 will continue lopressor  25 mg twice daily   7. COPD( emphysema): is stable: is now on 02 at 2L/[place] as needed to keep sats >91 %. Will continue advair 250/50 twice daily  spiriva 18 mcg daily duoneb every 4 hours as needed   8. Chronic allergic rhinitis: stable will continue atrovent nasal spray three times daily will monitor   9. Ischemic small bowel is status post exploratory  Laparoscopy: is stable will stop the ultram at this time and will begin tylenol 650 mg every 6 hours   10. Gastroesophageal reflux disease without esophagitis: stable will continue prilosec 20 mg daily    11. Chronic constipation: stable will continue miralax twice daily   12.  Pharyngoesophageal dysphagia: has chronic aspiration: is currently NPO and is receiving jevity tube feeding. For his dry mouth will continue biotene every 4 hours while awake and will monitor his status.  Will continue atrovent nasal spray prior to meals   13. CKD stage III: stable bun 13 creat 0.84   MD is aware of resident's narcotic use and is in agreement with current plan of care. We will attempt to wean resident as apropriate   Ok Edwards NP [facility]  Contact [phone] Monday through Friday 8am- 5pm  After hours call [phone]

## 2017-10-21 DIAGNOSIS — R2681 Unsteadiness on feet: Secondary | ICD-10-CM | POA: Diagnosis not present

## 2017-10-21 DIAGNOSIS — R293 Abnormal posture: Secondary | ICD-10-CM | POA: Diagnosis not present

## 2017-10-21 DIAGNOSIS — K56600 Partial intestinal obstruction, unspecified as to cause: Secondary | ICD-10-CM | POA: Diagnosis not present

## 2017-10-21 DIAGNOSIS — K219 Gastro-esophageal reflux disease without esophagitis: Secondary | ICD-10-CM | POA: Diagnosis not present

## 2017-10-21 DIAGNOSIS — M6281 Muscle weakness (generalized): Secondary | ICD-10-CM | POA: Diagnosis not present

## 2017-10-21 DIAGNOSIS — R1314 Dysphagia, pharyngoesophageal phase: Secondary | ICD-10-CM | POA: Diagnosis not present

## 2017-10-23 DIAGNOSIS — R293 Abnormal posture: Secondary | ICD-10-CM | POA: Diagnosis not present

## 2017-10-23 DIAGNOSIS — R1314 Dysphagia, pharyngoesophageal phase: Secondary | ICD-10-CM | POA: Diagnosis not present

## 2017-10-23 DIAGNOSIS — K56600 Partial intestinal obstruction, unspecified as to cause: Secondary | ICD-10-CM | POA: Diagnosis not present

## 2017-10-23 DIAGNOSIS — K219 Gastro-esophageal reflux disease without esophagitis: Secondary | ICD-10-CM | POA: Diagnosis not present

## 2017-10-23 DIAGNOSIS — R2681 Unsteadiness on feet: Secondary | ICD-10-CM | POA: Diagnosis not present

## 2017-10-23 DIAGNOSIS — M6281 Muscle weakness (generalized): Secondary | ICD-10-CM | POA: Diagnosis not present

## 2017-10-23 DIAGNOSIS — F4321 Adjustment disorder with depressed mood: Secondary | ICD-10-CM | POA: Diagnosis not present

## 2017-10-24 ENCOUNTER — Encounter: Payer: Self-pay | Admitting: Adult Health

## 2017-10-24 DIAGNOSIS — R293 Abnormal posture: Secondary | ICD-10-CM | POA: Diagnosis not present

## 2017-10-24 DIAGNOSIS — R1314 Dysphagia, pharyngoesophageal phase: Secondary | ICD-10-CM | POA: Diagnosis not present

## 2017-10-24 DIAGNOSIS — K219 Gastro-esophageal reflux disease without esophagitis: Secondary | ICD-10-CM | POA: Diagnosis not present

## 2017-10-24 DIAGNOSIS — R2681 Unsteadiness on feet: Secondary | ICD-10-CM | POA: Diagnosis not present

## 2017-10-24 DIAGNOSIS — M6281 Muscle weakness (generalized): Secondary | ICD-10-CM | POA: Diagnosis not present

## 2017-10-24 DIAGNOSIS — K56600 Partial intestinal obstruction, unspecified as to cause: Secondary | ICD-10-CM | POA: Diagnosis not present

## 2017-10-25 DIAGNOSIS — R1314 Dysphagia, pharyngoesophageal phase: Secondary | ICD-10-CM | POA: Diagnosis not present

## 2017-10-25 DIAGNOSIS — R293 Abnormal posture: Secondary | ICD-10-CM | POA: Diagnosis not present

## 2017-10-25 DIAGNOSIS — K219 Gastro-esophageal reflux disease without esophagitis: Secondary | ICD-10-CM | POA: Diagnosis not present

## 2017-10-25 DIAGNOSIS — M6281 Muscle weakness (generalized): Secondary | ICD-10-CM | POA: Diagnosis not present

## 2017-10-25 DIAGNOSIS — K56600 Partial intestinal obstruction, unspecified as to cause: Secondary | ICD-10-CM | POA: Diagnosis not present

## 2017-10-25 DIAGNOSIS — R2681 Unsteadiness on feet: Secondary | ICD-10-CM | POA: Diagnosis not present

## 2017-10-26 ENCOUNTER — Encounter: Payer: Self-pay | Admitting: Adult Health

## 2017-10-26 ENCOUNTER — Non-Acute Institutional Stay (SKILLED_NURSING_FACILITY): Payer: Medicare Other | Admitting: Adult Health

## 2017-10-26 DIAGNOSIS — I110 Hypertensive heart disease with heart failure: Secondary | ICD-10-CM

## 2017-10-26 DIAGNOSIS — J449 Chronic obstructive pulmonary disease, unspecified: Secondary | ICD-10-CM | POA: Diagnosis not present

## 2017-10-26 DIAGNOSIS — J309 Allergic rhinitis, unspecified: Secondary | ICD-10-CM | POA: Diagnosis not present

## 2017-10-26 DIAGNOSIS — I25119 Atherosclerotic heart disease of native coronary artery with unspecified angina pectoris: Secondary | ICD-10-CM

## 2017-10-26 NOTE — Progress Notes (Signed)
Location:   [facility] Room Number: 205 A Place of Service:  SNF (31)   CODE STATUS: Full Code  Allergies  Allergen Reactions  . Tamsulosin Other (See Comments)    Dizziness, Made BP very low and weakness  . Celebrex [Celecoxib] Hives and Nausea And Vomiting    Gi upset  . Dronedarone Nausea And Vomiting and Other (See Comments)    GI upset, abdominal pain  . Esomeprazole Magnesium Hives and Other (See Comments)    "don't really remember"  . Digoxin Diarrhea    May have caused some diarrhea  . Hydrocodone-Acetaminophen Other (See Comments)    Bad headache  . Protonix [Pantoprazole Sodium] Nausea And Vomiting and Other (See Comments)    Tolerates Dexilant    Chief Complaint  Patient presents with  . Medical Management of Chronic Issues    Cad; allergic rhinitis; copd; hypertensive heart disease     HPI:  He is a 84 year old long term resident of this facility being seen for the management of his chronic illnesses: cad; allergic rhinitis; copd; hypertensive heart disease. He tells me that he is feeling better. He is ambulating with his walker. He denie s any nausea or vomiting; no uncontrolled pain.   Past Medical History:  Diagnosis Date  . Abnormal thyroid scan    Abnormal thyroid imaging studies from 11/09/2010, status post ultrasound guided fine needle aspiration of the dominant left inferior thyroid nodule on 12/15/2010. Cytology report showed rare follicular epithelial cells and hemosiderin laden macrophages.  . Adenomatous colon polyp   . AICD (automatic cardioverter/defibrillator) present    a. fx lead; a. s/p lead extraction 03/02/17  . Arthritis    "all over"  . Asthma   . Atrial fibrillation ([place])    on chronic Coumadin; stopped July 2013 due to subdural hematomas  . CHF (congestive heart failure) (HCC)    EF 35-40% s/p most recent ICD generator change-out with Medtronic dual-chamber ICD 05/20/11 with explantation of previous  abdominally-implanted device  . Chronic systolic heart failure- EF 35-40% 12/30/2009    Ejection fraction of 35-40% with an ischemic cardiomyopathy.  . Coronary artery disease    s/p CABG 1983 and PCI/stent 2004.   . Diabetes mellitus    diet controlled  . Diverticulosis   . Dyslipidemia   . Enteritis   . Erythrocytosis   . GERD (gastroesophageal reflux disease)   . Hepatic steatosis   . Hypertension   . Ischemic cardiomyopathy    WITH CHF  . Monocytosis 04/17/2013  . Myocardial infarction ([place]) 1983; ~ 1990  . Pleural effusion    right  . Pneumonia August 2013  . Portal hypertensive gastropathy ([place])   . Rectal ulcer   . Renal calculi   . Subdural hematoma [facility]) July 2013   Anticoagulation stopped.   . SunDown syndrome   . VT (ventricular tachycardia) ([place])     Past Surgical History:  Procedure Laterality Date  . CHOLECYSTECTOMY    . COLONOSCOPY  07/07/2011   Procedure: COLONOSCOPY;  Surgeon: Jerene Bears, MD;  Location: [facility];  Service: Gastroenterology;  Laterality: N/A;  . CORONARY ANGIOPLASTY WITH STENT PLACEMENT  2004   Tandem Cypher stents LAD  . CORONARY ARTERY BYPASS GRAFT  1983   SVG-mLAD  . ESOPHAGOGASTRODUODENOSCOPY  02/11/2011   Procedure: ESOPHAGOGASTRODUODENOSCOPY (EGD);  Surgeon: Beryle Beams, MD;  Location: [facility];  Service: Endoscopy;  Laterality: N/A;  . FLEXIBLE SIGMOIDOSCOPY N/A 02/08/2017   Procedure: FLEXIBLE SIGMOIDOSCOPY;  Surgeon: Irene Shipper, MD;  Location: [facility];  Service: Endoscopy;  Laterality: N/A;  . ICD GENERATOR CHANGEOUT N/A 04/08/2016   Procedure: ICD Generator Changeout;  Surgeon: Evans Lance, MD;  Location: [place] [facility];  Service: Cardiovascular;  Laterality: N/A;  . ICD LEAD REMOVAL N/A 03/02/2017   Procedure: ICD LEAD REMOVAL;  Surgeon: Evans Lance, MD;  Location: [place];  Service: Cardiovascular;  Laterality: N/A;  . IMPLANTABLE CARDIOVERTER DEFIBRILLATOR (ICD) GENERATOR CHANGE N/A 05/20/2011    Procedure: ICD GENERATOR CHANGE;  Surgeon: Evans Lance, MD;  Medtronic secure dual-chamber ICD serial number [id number]   . IR GASTROSTOMY TUBE MOD SED  04/19/2017  . KNEE ARTHROSCOPY     right; "just went in and scraped it"  . LAPAROTOMY N/A 09/30/2017   Procedure: LAPAROTOMY, LYSIS OF ADHESION;  Surgeon: Rolm Bookbinder, MD;  Location: [place];  Service: General;  Laterality: N/A;  . LEAD INSERTION N/A 03/02/2017   Procedure: LEAD INSERTION;  Surgeon: Evans Lance, MD;  Location: [place];  Service: Cardiovascular;  Laterality: N/A;  . MASS EXCISION Right 05/10/2013   Procedure: EXCISION MASS RIGHT THUMB;  Surgeon: Wynonia Sours, MD;  Location: [place];  Service: Orthopedics;  Laterality: Right;  . PROXIMAL INTERPHALANGEAL FUSION (PIP) Right 05/10/2013   Procedure: DEBRIDEMENT PROXIMAL INTERPHALANGEAL FUSION (PIP);  Surgeon: Wynonia Sours, MD;  Location: [place];  Service: Orthopedics;  Laterality: Right;  . TONSILLECTOMY          Social History   Socioeconomic History  . Marital status: Divorced    Spouse name: Not on file  . Number of children: 2  . Years of education: Not on file  . Highest education level: Not on file  Occupational History  . Occupation: Sports coach: RETIRED  Social Needs  . Financial resource strain: Not hard at all  . Food insecurity:    Worry: Never true    Inability: Never true  . Transportation needs:    Medical: No    Non-medical: No  Tobacco Use  . Smoking status: Former Smoker    Packs/day: 2.00    Years: 30.00    Pack years: 60.00    Types: Cigarettes    Last attempt to quit: 06/23/1976    Years since quitting: 41.3  . Smokeless tobacco: Never Used  Substance and Sexual Activity  . Alcohol use: No  . Drug use: No  . Sexual activity: Never  Lifestyle  . Physical activity:    Days per week: 7 days    Minutes per session: 60 min  . Stress: Not at all  Relationships  . Social connections:     Talks on phone: Once a week    Gets together: Once a week    Attends religious service: Never    Active member of club or organization: No    Attends meetings of clubs or organizations: Never    Relationship status: Divorced  . Intimate partner violence:    Fear of current or ex partner: No    Emotionally abused: No    Physically abused: No    Forced sexual activity: No  Other Topics Concern  . Not on file  Social History Narrative  . Not on file   Family History  Problem Relation Age of Onset  . Tuberculosis Mother   . Tuberculosis Father   . Heart disease Brother   . Diabetes Sister   . Diabetes  Brother   . Clotting disorder Brother       VITAL SIGNS BP (!) 114/59   Pulse 74   Temp (!) 97.1 F (36.2 C)   Resp 18   Ht '6\' 1"'$  (1.854 m)   Wt 146 lb (66.2 kg)   SpO2 98%   BMI 19.26 kg/m   Outpatient Encounter Medications as of 10/26/2017  Medication Sig  . acetaminophen (TYLENOL) 325 MG tablet Give 2 tablets via PEG - tube every 6 hours for stomach pain  . ADVAIR DISKUS 250-50 MCG/DOSE AEPB Inhale 1 puff into the lungs 2 (two) times daily.   . Artificial Saliva (BIOTENE MOISTURIZING MOUTH) SOLN Give 1 swab by mouth every 4 hours while awake  . digoxin (LANOXIN) 0.05 MG/ML solution Place 2.5 mLs (0.125 mg total) into feeding tube daily.  . furosemide (LASIX) 10 MG/ML solution Place 20 mg into feeding tube daily. Give 2 ml ('20mg'$ ) via PEG - Tube daily  . ipratropium (ATROVENT) 0.03 % nasal spray Place 1 spray into both nostrils 3 (three) times daily before meals.  Marland Kitchen ipratropium-albuterol (DUONEB) 0.5-2.5 (3) MG/3ML SOLN Take 3 mLs by nebulization every 4 (four) hours as needed.  . metoprolol tartrate (LOPRESSOR) 25 MG tablet Place 1 tablet (25 mg total) into feeding tube 2 (two) times daily.  . nitroGLYCERIN (NITROSTAT) 0.4 MG SL tablet Place 1 tablet (0.4 mg total) under the tongue every 5 (five) minutes as needed for chest pain.  . Nutritional Supplements (FEEDING  SUPPLEMENT, JEVITY 1.5 CAL,) LIQD Jevity 1.5 -  Give 237 ml every 4 hours per peg tube  . omeprazole (PRILOSEC) 20 MG capsule Give 1 capsule via Peg Tube daily  . ondansetron (ZOFRAN) 4 MG tablet Place 4 mg into feeding tube every 6 (six) hours as needed for nausea or vomiting.  . polyethylene glycol (MIRALAX / GLYCOLAX) packet Place 17 g into feeding tube 2 (two) times daily.   . potassium chloride 20 MEQ/15ML (10%) SOLN Place 20 mEq into feeding tube daily.  . RESTASIS 0.05 % ophthalmic emulsion INSTILL 1 DROP INTO BOTH EYES TWICE A DAY  . tiotropium (SPIRIVA) 18 MCG inhalation capsule Place 18 mcg into inhaler and inhale daily.  . Water For Irrigation, Sterile (FREE WATER) SOLN Place 120 mLs into feeding tube every 4 (four) hours. 120 ml H2o flushes via tube every 4 hours   No facility-administered encounter medications on file as of 10/26/2017.      SIGNIFICANT DIAGNOSTIC EXAMS  PREVIOUS:   03-26-17: ct of chest abdomen and pelvis: 1. No acute/traumatic intrathoracic, abdominal, or pelvic pathology. 2. Nonocclusive pulmonary embolus in the subsegmental right lower lobe pulmonary artery branches likely old PE or scarring. Nonocclusive acute PE is not entirely excluded. Clinical correlation is recommended. 3. Mild cardiomegaly with dilatation of the right atrium. 4. 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 Small right pleural effusion and stable right lung base round atelectasis/scarring. 5. Aortic Atherosclerosis  and Emphysema    04-07-17: chest x-ray: Suspected patchy infiltrate at the right base, short interval radiographic follow-up suggested to ensure clearing. Cardiomegaly.  04-09-17: chest x-ray: COPD, cardiomegaly, stable. Increasing right basilar airspace opacity and small right effusion concerning for pneumonia  04-10-17: swallow study: Recommend NPO with short term alternative nutrition and continued ST   04-19-17: PEG tube insertion   05-01-17: chest  x-ray: mild right basilar infiltrate with trace right sided effusion  05-23-17: chest x-ray: COPD changes with mild atelectasis and small pleural effusion versus pleural  thickening at RIGHT base. Improved RIGHT basilar infiltrates since previous exam.  06-19-17: swallow study: Recommend pt continue po and dysphagia treatment with SLP. Once compensation strategies are generalized, suspect pt may tolerate comfort po intake independently. As his strong "hock" is protective of his airway, suspect care plan may be able to be more liberal. Anticipate however that pt will continue to require PEG tube for nutrition.   09-30-17: ct of abdomen and pelvis:1. Severe small-bowel pneumatosis intestinalis with mesenteric venous gas. Resultant small bowel obstruction versus ileus. 2. Small amount of ascites. No abscess. 3. New lingular pneumonia. Stable rounded density RIGHT lung base most compatible with round pneumonia/scarring. New small RIGHT pleural effusion.   10-01-17: chest x-ray: 1. 6 mm thick hyperlucency along the periphery of the right mid lung may reflect a small pneumothorax, less than 5%. 2. Stable cardiomegaly with aortic atherosclerosis left basilar atelectasis. Probable small left effusion and chronic minimal blunting of the right lateral costophrenic angle which may reflect pleural thickening or chronic trace effusion.  10-10-17: chest x-ray: Slight enlargement of bilateral effusions since the study of 8 days ago. Associated basilar atelectasis.  10-10-17: ct of abdomen an pelvis: 1. No evidence for bowel obstruction. No bowel wall thickening. The pneumatosis and mesenteric gas seen on the previous exam has resolved in the interval. 2. Similar degree of mild to moderate intraperitoneal free fluid, mainly in the pelvis. 3. Tiny gas bubble in the bladder lumen, likely related to recent instrumentation although bladder infection could have this appearance. 4. Body wall edema 5.  Aortic Atherosclerois    6. 7 mm nonobstructing stone lower pole left kidney  NO NEW EXAMS.     LABS REVIEWED: PREVIOUS:   04-07-17: wbc 9.5; hgb 13.4; hct 41.9; mcv 94.2; plt 101; glucose 84; bun 18; creat 1.28 ;k+ 4.0; na++ 136; ca 8.2; total bili 2.5; albumin 3.2; rapid flu: +A; blood culture: no growth; urine culture: <10,000 colonies 04-09-17: wbc 9.0; hgb 11.5; hct 35.1; mcv 91.9; plt 120; glucose 88; bun 9; creat 0.86; k+ 3.5; na++ 133; ca 7.5; mag 1.7 04-13-17: wbc 9.4; hgb 13.8; hct 42.6; mcv 94.0; plt 217; glucose 115; bun 11; creat 1.04 ;k+ 3.3; na++144; ca 8.7 04-16-17: wbc 6.5; hgb 12.2; hct 38.5; mcv 95.3; plt 185; glucose 115; bun 11; creat 0.78; k+ 4.0; na++ 143; ca 8.9; mag 1.7  04-20-17:wbc 12.4; hgb 12.2; hct 38.0; mcv 93.6; plt 209; glucose 110; bun 18; creat 0.95; k+ 4.7; na++ 136; ca 8.4; mag 2.7;  04-22-17: mag 1.9 05-02-17: wbc 7.1; hgb 11.8; hct 34.0; mcv 87.6; plt 223; glucose 130; bun 22.2; creat 0.71; k+ 4.4; na++ 143; ca 9.1; alk phos 123; albumin 3.4; tsh 0.91  06-16-17: wbc 8.7; hgb 13.9; hct 42.2; mcv 91.3; plt 179 07-11-17: H pylori: IgG AB: 0.43; IgA AB: 20.1 (hi): IgM abs: <9.0 09-29-17: wbc 13.9; hgb 14.4; hct 46.8; mcv 93.8; plt 158; glucose 139; bun 29; creat 1.14; k+ 4.4; na++ 139; ca 9.5 liver normal albumin 4.0  09-30-17: dig level: 1.1 10-02-17: wbc  15.1 hgb 12.9; hc6 42.5; mcv 95.1; plt 151 glucose 95; bun 25; creat 0.88; k+ 4.2; na++ 142; ca 8.5 10-04-17: wbc 8.2; hgb 11.9; hct 38.2; mcv 92.3; plt  149 glucose 125; bun 18; creat 0.71 k+ 3.4; na++142; ca 8.7; mag 1.4  10-10-17: wbc 74; hgb 11.5; hct 37.6; mcv 93.;8 plt 246; glucose 152; bun 13; creat 0.84; k+ 4.8; na++ 136; ca 8.7 liver normal albumin 3.1  Urine culture:  no growth   NO NEW LABS.   Review of Systems  Constitutional: Negative for malaise/fatigue.  Respiratory: Negative for cough and shortness of breath.   Cardiovascular: Negative for chest pain, palpitations and leg swelling.  Gastrointestinal: Negative for abdominal  pain, constipation and heartburn.  Musculoskeletal: Negative for back pain, joint pain and myalgias.  Skin: Negative.   Neurological: Negative for dizziness.  Psychiatric/Behavioral: The patient is not nervous/anxious.      Physical Exam  Constitutional: He is oriented to person, place, and time. No distress.  Frail   Neck: No thyromegaly present.  Cardiovascular: Normal rate, regular rhythm and intact distal pulses.  Murmur heard. 1/6  Pulmonary/Chest: Effort normal and breath sounds normal. No respiratory distress.  Abdominal: Soft. Bowel sounds are normal. He exhibits no distension. There is no tenderness.  Peg tube present without signs of infection present.   Musculoskeletal: Normal range of motion. He exhibits no edema.  Using walker   Lymphadenopathy:    He has no cervical adenopathy.  Neurological: He is alert and oriented to person, place, and time.  Skin: Skin is warm and dry. He is not diaphoretic.  Psychiatric: He has a normal mood and affect.      ASSESSMENT/ PLAN:  TODAY;  1. Coronary artery disease involving native coronary artery with angina pectoris: is status post CABG/ stent placements: is stable will continue lopressor 25 mg twice daily and has prn ntg.   2. Hypertensive heart disease with heart failure: stable b/p 114/59 will continue lopressor 25 mg twice daily   3. COPD( emphysema): is stable: is now on 02 at 2L/[place] as needed to keep sats >91 %. Will continue advair 250/50 twice daily  spiriva 18 mcg daily duoneb every 4 hours as needed   4. Chronic allergic rhinitis: stable will continue atrovent nasal spray three times daily will monitor   PREVIOUS  5. Ischemic small bowel is status post exploratory  Laparoscopy: is stable will stop the ultram at this time and will begin tylenol 650 mg every 6 hours   6. Gastroesophageal reflux disease without esophagitis: stable will continue prilosec 20 mg daily    7. Chronic constipation: stable will continue  miralax twice daily   8.  Pharyngoesophageal dysphagia: has chronic aspiration: is currently NPO and is receiving jevity tube feeding. For his dry mouth will continue biotene every 4 hours while awake and will monitor his status.  Will continue atrovent nasal spray prior to meals   9. CKD stage III: stable bun 13 creat 0.84  10.  Adult failure to thrive: weight is 146 pounds; albumin is 3.1; without change in status; will continue to monitor  11. History of right lung PE; without change he is not a candidate for anticoagulation therapy due to history of falls and subdural hematoma.     12. Chronic systolic heart failure: EF 35-40%: is status post AICD placement:  is stable will continue lasix 20 mg daily with k+ 20 meq daily  lopressor 25 mg twice daily and will monitor his status.   13. Atrial fibrillation with controlled ventricular response: has history of PE  heart rate stable: is not a candidate for anticoagulations due to history of subdural hematoma: will continue lopressor 25 mg twice daily and digoxin 0.125 mg daily for rate control     MD is aware of resident's narcotic use and is in agreement with current plan of care. We will attempt to wean resident as apropriate   Ok Edwards NP [facility]  Adult Medicine  Contact [phone] Monday through Friday 8am- 5pm  After hours call [phone]

## 2017-10-27 DIAGNOSIS — K219 Gastro-esophageal reflux disease without esophagitis: Secondary | ICD-10-CM | POA: Diagnosis not present

## 2017-10-27 DIAGNOSIS — R2681 Unsteadiness on feet: Secondary | ICD-10-CM | POA: Diagnosis not present

## 2017-10-27 DIAGNOSIS — K56600 Partial intestinal obstruction, unspecified as to cause: Secondary | ICD-10-CM | POA: Diagnosis not present

## 2017-10-27 DIAGNOSIS — R1314 Dysphagia, pharyngoesophageal phase: Secondary | ICD-10-CM | POA: Diagnosis not present

## 2017-10-27 DIAGNOSIS — R293 Abnormal posture: Secondary | ICD-10-CM | POA: Diagnosis not present

## 2017-10-27 DIAGNOSIS — M6281 Muscle weakness (generalized): Secondary | ICD-10-CM | POA: Diagnosis not present

## 2017-10-29 DIAGNOSIS — R293 Abnormal posture: Secondary | ICD-10-CM | POA: Diagnosis not present

## 2017-10-29 DIAGNOSIS — R1314 Dysphagia, pharyngoesophageal phase: Secondary | ICD-10-CM | POA: Diagnosis not present

## 2017-10-29 DIAGNOSIS — R2681 Unsteadiness on feet: Secondary | ICD-10-CM | POA: Diagnosis not present

## 2017-10-29 DIAGNOSIS — M6281 Muscle weakness (generalized): Secondary | ICD-10-CM | POA: Diagnosis not present

## 2017-10-29 DIAGNOSIS — K219 Gastro-esophageal reflux disease without esophagitis: Secondary | ICD-10-CM | POA: Diagnosis not present

## 2017-10-29 DIAGNOSIS — K56600 Partial intestinal obstruction, unspecified as to cause: Secondary | ICD-10-CM | POA: Diagnosis not present

## 2017-10-30 DIAGNOSIS — M6281 Muscle weakness (generalized): Secondary | ICD-10-CM | POA: Diagnosis not present

## 2017-10-30 DIAGNOSIS — R2681 Unsteadiness on feet: Secondary | ICD-10-CM | POA: Diagnosis not present

## 2017-10-30 DIAGNOSIS — R1314 Dysphagia, pharyngoesophageal phase: Secondary | ICD-10-CM | POA: Diagnosis not present

## 2017-10-30 DIAGNOSIS — R293 Abnormal posture: Secondary | ICD-10-CM | POA: Diagnosis not present

## 2017-10-30 DIAGNOSIS — K56600 Partial intestinal obstruction, unspecified as to cause: Secondary | ICD-10-CM | POA: Diagnosis not present

## 2017-10-30 DIAGNOSIS — K219 Gastro-esophageal reflux disease without esophagitis: Secondary | ICD-10-CM | POA: Diagnosis not present

## 2017-10-31 DIAGNOSIS — K56600 Partial intestinal obstruction, unspecified as to cause: Secondary | ICD-10-CM | POA: Diagnosis not present

## 2017-10-31 DIAGNOSIS — Z8701 Personal history of pneumonia (recurrent): Secondary | ICD-10-CM | POA: Diagnosis not present

## 2017-10-31 DIAGNOSIS — K219 Gastro-esophageal reflux disease without esophagitis: Secondary | ICD-10-CM | POA: Diagnosis not present

## 2017-10-31 DIAGNOSIS — M6281 Muscle weakness (generalized): Secondary | ICD-10-CM | POA: Diagnosis not present

## 2017-11-01 ENCOUNTER — Non-Acute Institutional Stay (SKILLED_NURSING_FACILITY): Payer: Medicare Other | Admitting: Adult Health

## 2017-11-01 ENCOUNTER — Encounter: Payer: Self-pay | Admitting: Adult Health

## 2017-11-01 DIAGNOSIS — R1314 Dysphagia, pharyngoesophageal phase: Secondary | ICD-10-CM

## 2017-11-01 DIAGNOSIS — K5909 Other constipation: Secondary | ICD-10-CM

## 2017-11-01 DIAGNOSIS — K219 Gastro-esophageal reflux disease without esophagitis: Secondary | ICD-10-CM

## 2017-11-01 DIAGNOSIS — M6281 Muscle weakness (generalized): Secondary | ICD-10-CM | POA: Diagnosis not present

## 2017-11-01 DIAGNOSIS — Z8701 Personal history of pneumonia (recurrent): Secondary | ICD-10-CM | POA: Diagnosis not present

## 2017-11-01 DIAGNOSIS — K559 Vascular disorder of intestine, unspecified: Secondary | ICD-10-CM

## 2017-11-01 DIAGNOSIS — K56600 Partial intestinal obstruction, unspecified as to cause: Secondary | ICD-10-CM | POA: Diagnosis not present

## 2017-11-01 NOTE — Progress Notes (Signed)
Location:   [facility] Room Number: 205 A Place of Service:  SNF (31)   CODE STATUS: Full code  Allergies  Allergen Reactions  . Tamsulosin Other (See Comments)    Dizziness, Made BP very low and weakness  . Celebrex [Celecoxib] Hives and Nausea And Vomiting    Gi upset  . Dronedarone Nausea And Vomiting and Other (See Comments)    GI upset, abdominal pain  . Esomeprazole Magnesium Hives and Other (See Comments)    "don't really remember"  . Digoxin Diarrhea    May have caused some diarrhea  . Hydrocodone-Acetaminophen Other (See Comments)    Bad headache  . Protonix [Pantoprazole Sodium] Nausea And Vomiting and Other (See Comments)    Tolerates Dexilant    Chief Complaint  Patient presents with  . Medical Management of Chronic Issues    Gerd; dysphagia; constipation; ischemic bowel weekly follow up for the first 30 days post hospitalization     HPI:  He is a 84 year old long term resident of this facility being seen for the management of his chronic illnesses; gerd; dysphagia; constipation; ischemic bowel. He denies any uncontrolled pain; no constipation; no choking. There are no nursing concerns at this time.   Past Medical History:  Diagnosis Date  . Abnormal thyroid scan    Abnormal thyroid imaging studies from 11/09/2010, status post ultrasound guided fine needle aspiration of the dominant left inferior thyroid nodule on 12/15/2010. Cytology report showed rare follicular epithelial cells and hemosiderin laden macrophages.  . Adenomatous colon polyp   . AICD (automatic cardioverter/defibrillator) present    a. fx lead; a. s/p lead extraction 03/02/17  . Arthritis    "all over"  . Asthma   . Atrial fibrillation ([place])    on chronic Coumadin; stopped July 2013 due to subdural hematomas  . CHF (congestive heart failure) (HCC)    EF 35-40% s/p most recent ICD generator change-out with Medtronic dual-chamber ICD 05/20/11 with explantation of previous  abdominally-implanted device  . Chronic systolic heart failure- EF 35-40% 12/30/2009    Ejection fraction of 35-40% with an ischemic cardiomyopathy.  . Coronary artery disease    s/p CABG 1983 and PCI/stent 2004.   . Diabetes mellitus    diet controlled  . Diverticulosis   . Dyslipidemia   . Enteritis   . Erythrocytosis   . GERD (gastroesophageal reflux disease)   . Hepatic steatosis   . Hypertension   . Ischemic cardiomyopathy    WITH CHF  . Monocytosis 04/17/2013  . Myocardial infarction ([place]) 1983; ~ 1990  . Pleural effusion    right  . Pneumonia August 2013  . Portal hypertensive gastropathy ([place])   . Rectal ulcer   . Renal calculi   . Subdural hematoma [facility]) July 2013   Anticoagulation stopped.   . SunDown syndrome   . VT (ventricular tachycardia) ([place])     Past Surgical History:  Procedure Laterality Date  . CHOLECYSTECTOMY    . COLONOSCOPY  07/07/2011   Procedure: COLONOSCOPY;  Surgeon: Jerene Bears, MD;  Location: [facility] ENDOSCOPY;  Service: Gastroenterology;  Laterality: N/A;  . CORONARY ANGIOPLASTY WITH STENT PLACEMENT  2004   Tandem Cypher stents LAD  . CORONARY ARTERY BYPASS GRAFT  1983   SVG-mLAD  . ESOPHAGOGASTRODUODENOSCOPY  02/11/2011   Procedure: ESOPHAGOGASTRODUODENOSCOPY (EGD);  Surgeon: Beryle Beams, MD;  Location: [facility];  Service: Endoscopy;  Laterality: N/A;  . FLEXIBLE SIGMOIDOSCOPY N/A 02/08/2017   Procedure: FLEXIBLE SIGMOIDOSCOPY;  Surgeon: Irene Shipper, MD;  Location: [facility];  Service: Endoscopy;  Laterality: N/A;  . ICD GENERATOR CHANGEOUT N/A 04/08/2016   Procedure: ICD Generator Changeout;  Surgeon: Evans Lance, MD;  Location: [place] CV [facility];  Service: Cardiovascular;  Laterality: N/A;  . ICD LEAD REMOVAL N/A 03/02/2017   Procedure: ICD LEAD REMOVAL;  Surgeon: Evans Lance, MD;  Location: [place];  Service: Cardiovascular;  Laterality: N/A;  . IMPLANTABLE CARDIOVERTER DEFIBRILLATOR (ICD) GENERATOR CHANGE N/A 05/20/2011    Procedure: ICD GENERATOR CHANGE;  Surgeon: Evans Lance, MD;  Medtronic secure dual-chamber ICD serial number [id number]   . IR GASTROSTOMY TUBE MOD SED  04/19/2017  . KNEE ARTHROSCOPY     right; "just went in and scraped it"  . LAPAROTOMY N/A 09/30/2017   Procedure: LAPAROTOMY, LYSIS OF ADHESION;  Surgeon: Rolm Bookbinder, MD;  Location: [place];  Service: General;  Laterality: N/A;  . LEAD INSERTION N/A 03/02/2017   Procedure: LEAD INSERTION;  Surgeon: Evans Lance, MD;  Location: [place];  Service: Cardiovascular;  Laterality: N/A;  . MASS EXCISION Right 05/10/2013   Procedure: EXCISION MASS RIGHT THUMB;  Surgeon: Wynonia Sours, MD;  Location: [place];  Service: Orthopedics;  Laterality: Right;  . PROXIMAL INTERPHALANGEAL FUSION (PIP) Right 05/10/2013   Procedure: DEBRIDEMENT PROXIMAL INTERPHALANGEAL FUSION (PIP);  Surgeon: Wynonia Sours, MD;  Location: [place];  Service: Orthopedics;  Laterality: Right;  . TONSILLECTOMY          Social History   Socioeconomic History  . Marital status: Divorced    Spouse name: Not on file  . Number of children: 2  . Years of education: Not on file  . Highest education level: Not on file  Occupational History  . Occupation: Sports coach: RETIRED  Social Needs  . Financial resource strain: Not hard at all  . Food insecurity:    Worry: Never true    Inability: Never true  . Transportation needs:    Medical: No    Non-medical: No  Tobacco Use  . Smoking status: Former Smoker    Packs/day: 2.00    Years: 30.00    Pack years: 60.00    Types: Cigarettes    Last attempt to quit: 06/23/1976    Years since quitting: 41.3  . Smokeless tobacco: Never Used  Substance and Sexual Activity  . Alcohol use: No  . Drug use: No  . Sexual activity: Never  Lifestyle  . Physical activity:    Days per week: 7 days    Minutes per session: 60 min  . Stress: Not at all  Relationships  . Social connections:     Talks on phone: Once a week    Gets together: Once a week    Attends religious service: Never    Active member of club or organization: No    Attends meetings of clubs or organizations: Never    Relationship status: Divorced  . Intimate partner violence:    Fear of current or ex partner: No    Emotionally abused: No    Physically abused: No    Forced sexual activity: No  Other Topics Concern  . Not on file  Social History Narrative  . Not on file   Family History  Problem Relation Age of Onset  . Tuberculosis Mother   . Tuberculosis Father   . Heart disease Brother   . Diabetes Sister   . Diabetes  Brother   . Clotting disorder Brother       VITAL SIGNS BP (!) 114/59   Pulse 74   Temp (!) 97.1 F (36.2 C)   Resp 18   Ht '6\' 1"'$  (1.854 m)   Wt 143 lb 9.6 oz (65.1 kg)   SpO2 98%   BMI 18.95 kg/m   Outpatient Encounter Medications as of 11/01/2017  Medication Sig  . acetaminophen (TYLENOL) 325 MG tablet Give 2 tablets via PEG - tube every 6 hours for stomach pain  . ADVAIR DISKUS 250-50 MCG/DOSE AEPB Inhale 1 puff into the lungs 2 (two) times daily.   . Artificial Saliva (BIOTENE MOISTURIZING MOUTH) SOLN Give 1 swab by mouth every 4 hours while awake  . digoxin (LANOXIN) 0.05 MG/ML solution Place 2.5 mLs (0.125 mg total) into feeding tube daily.  . furosemide (LASIX) 10 MG/ML solution Place 20 mg into feeding tube daily. Give 2 ml ('20mg'$ ) via PEG - Tube daily  . ipratropium (ATROVENT) 0.03 % nasal spray Place 1 spray into both nostrils 3 (three) times daily before meals.  Marland Kitchen ipratropium-albuterol (DUONEB) 0.5-2.5 (3) MG/3ML SOLN Take 3 mLs by nebulization every 4 (four) hours as needed.  . metoprolol tartrate (LOPRESSOR) 25 MG tablet Place 1 tablet (25 mg total) into feeding tube 2 (two) times daily.  . nitroGLYCERIN (NITROSTAT) 0.4 MG SL tablet Place 1 tablet (0.4 mg total) under the tongue every 5 (five) minutes as needed for chest pain.  . Nutritional Supplements (FEEDING  SUPPLEMENT, JEVITY 1.5 CAL,) LIQD Jevity 1.5 -  Give 237 ml every 4 hours per peg tube  . omeprazole (PRILOSEC) 20 MG capsule Give 1 capsule via Peg Tube daily  . ondansetron (ZOFRAN) 4 MG tablet Place 4 mg into feeding tube every 6 (six) hours as needed for nausea or vomiting.  . OXYGEN Inhale 2 L/min into the lungs daily as needed. To maintain sats less than 90%  . polyethylene glycol (MIRALAX / GLYCOLAX) packet Place 17 g into feeding tube 2 (two) times daily.   . potassium chloride 20 MEQ/15ML (10%) SOLN Place 20 mEq into feeding tube daily.  . RESTASIS 0.05 % ophthalmic emulsion INSTILL 1 DROP INTO BOTH EYES TWICE A DAY  . tiotropium (SPIRIVA) 18 MCG inhalation capsule Place 18 mcg into inhaler and inhale daily.  . Water For Irrigation, Sterile (FREE WATER) SOLN Place 120 mLs into feeding tube every 4 (four) hours. 120 ml H2o flushes via tube every 4 hours   No facility-administered encounter medications on file as of 11/01/2017.      SIGNIFICANT DIAGNOSTIC EXAMS   PREVIOUS:   03-26-17: ct of chest abdomen and pelvis: 1. No acute/traumatic intrathoracic, abdominal, or pelvic pathology. 2. Nonocclusive pulmonary embolus in the subsegmental right lower lobe pulmonary artery branches likely old PE or scarring. Nonocclusive acute PE is not entirely excluded. Clinical correlation is recommended. 3. Mild cardiomegaly with dilatation of the right atrium. 4. 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 Small right pleural effusion and stable right lung base round atelectasis/scarring. 5. Aortic Atherosclerosis  and Emphysema    04-07-17: chest x-ray: Suspected patchy infiltrate at the right base, short interval radiographic follow-up suggested to ensure clearing. Cardiomegaly.  04-09-17: chest x-ray: COPD, cardiomegaly, stable. Increasing right basilar airspace opacity and small right effusion concerning for pneumonia  04-10-17: swallow study: Recommend NPO with short term  alternative nutrition and continued ST   04-19-17: PEG tube insertion   05-01-17: chest x-ray: mild right basilar  infiltrate with trace right sided effusion  05-23-17: chest x-ray: COPD changes with mild atelectasis and small pleural effusion versus pleural thickening at RIGHT base. Improved RIGHT basilar infiltrates since previous exam.  06-19-17: swallow study: Recommend pt continue po and dysphagia treatment with SLP. Once compensation strategies are generalized, suspect pt may tolerate comfort po intake independently. As his strong "hock" is protective of his airway, suspect care plan may be able to be more liberal. Anticipate however that pt will continue to require PEG tube for nutrition.   09-30-17: ct of abdomen and pelvis:1. Severe small-bowel pneumatosis intestinalis with mesenteric venous gas. Resultant small bowel obstruction versus ileus. 2. Small amount of ascites. No abscess. 3. New lingular pneumonia. Stable rounded density RIGHT lung base most compatible with round pneumonia/scarring. New small RIGHT pleural effusion.   10-01-17: chest x-ray: 1. 6 mm thick hyperlucency along the periphery of the right mid lung may reflect a small pneumothorax, less than 5%. 2. Stable cardiomegaly with aortic atherosclerosis left basilar atelectasis. Probable small left effusion and chronic minimal blunting of the right lateral costophrenic angle which may reflect pleural thickening or chronic trace effusion.  10-10-17: chest x-ray: Slight enlargement of bilateral effusions since the study of 8 days ago. Associated basilar atelectasis.  10-10-17: ct of abdomen an pelvis: 1. No evidence for bowel obstruction. No bowel wall thickening. The pneumatosis and mesenteric gas seen on the previous exam has resolved in the interval. 2. Similar degree of mild to moderate intraperitoneal free fluid, mainly in the pelvis. 3. Tiny gas bubble in the bladder lumen, likely related to recent instrumentation although  bladder infection could have this appearance. 4. Body wall edema 5.  Aortic Atherosclerois  6. 7 mm nonobstructing stone lower pole left kidney  NO NEW EXAMS.     LABS REVIEWED: PREVIOUS:   04-07-17: wbc 9.5; hgb 13.4; hct 41.9; mcv 94.2; plt 101; glucose 84; bun 18; creat 1.28 ;k+ 4.0; na++ 136; ca 8.2; total bili 2.5; albumin 3.2; rapid flu: +A; blood culture: no growth; urine culture: <10,000 colonies 04-09-17: wbc 9.0; hgb 11.5; hct 35.1; mcv 91.9; plt 120; glucose 88; bun 9; creat 0.86; k+ 3.5; na++ 133; ca 7.5; mag 1.7 04-13-17: wbc 9.4; hgb 13.8; hct 42.6; mcv 94.0; plt 217; glucose 115; bun 11; creat 1.04 ;k+ 3.3; na++144; ca 8.7 04-16-17: wbc 6.5; hgb 12.2; hct 38.5; mcv 95.3; plt 185; glucose 115; bun 11; creat 0.78; k+ 4.0; na++ 143; ca 8.9; mag 1.7  04-20-17:wbc 12.4; hgb 12.2; hct 38.0; mcv 93.6; plt 209; glucose 110; bun 18; creat 0.95; k+ 4.7; na++ 136; ca 8.4; mag 2.7;  04-22-17: mag 1.9 05-02-17: wbc 7.1; hgb 11.8; hct 34.0; mcv 87.6; plt 223; glucose 130; bun 22.2; creat 0.71; k+ 4.4; na++ 143; ca 9.1; alk phos 123; albumin 3.4; tsh 0.91  06-16-17: wbc 8.7; hgb 13.9; hct 42.2; mcv 91.3; plt 179 07-11-17: H pylori: IgG AB: 0.43; IgA AB: 20.1 (hi): IgM abs: <9.0 09-29-17: wbc 13.9; hgb 14.4; hct 46.8; mcv 93.8; plt 158; glucose 139; bun 29; creat 1.14; k+ 4.4; na++ 139; ca 9.5 liver normal albumin 4.0  09-30-17: dig level: 1.1 10-02-17: wbc  15.1 hgb 12.9; hc6 42.5; mcv 95.1; plt 151 glucose 95; bun 25; creat 0.88; k+ 4.2; na++ 142; ca 8.5 10-04-17: wbc 8.2; hgb 11.9; hct 38.2; mcv 92.3; plt  149 glucose 125; bun 18; creat 0.71 k+ 3.4; na++142; ca 8.7; mag 1.4  10-10-17: wbc 74; hgb 11.5; hct 37.6; mcv 93.;8 plt  246; glucose 152; bun 13; creat 0.84; k+ 4.8; na++ 136; ca 8.7 liver normal albumin 3.1  Urine culture: no growth   NO NEW LABS.    Review of Systems  Constitutional: Negative for malaise/fatigue.  Respiratory: Negative for cough and shortness of breath.   Cardiovascular:  Negative for chest pain, palpitations and leg swelling.  Gastrointestinal: Negative for abdominal pain, constipation and heartburn.  Musculoskeletal: Negative for back pain, joint pain and myalgias.  Skin: Negative.   Neurological: Negative for dizziness.  Psychiatric/Behavioral: The patient is not nervous/anxious.       Physical Exam  Constitutional: He is oriented to person, place, and time. No distress.  Frail   Neck: No thyromegaly present.  Cardiovascular: Normal rate, regular rhythm and intact distal pulses.  Murmur heard. 1/6  Pulmonary/Chest: Effort normal and breath sounds normal. No respiratory distress.  Abdominal: Soft. Bowel sounds are normal. He exhibits no distension. There is no tenderness.  Peg tube present without signs of infection present.   Musculoskeletal: Normal range of motion. He exhibits no edema.  Uses walker   Lymphadenopathy:    He has no cervical adenopathy.  Neurological: He is alert and oriented to person, place, and time.  Skin: Skin is warm and dry. He is not diaphoretic.  Psychiatric: He has a normal mood and affect.   .   ASSESSMENT/ PLAN:  TODAY;  1. Ischemic small bowel is status post exploratory  Laparoscopy: is stable will continue tylenol 650 mg every 6 hours   2. Gastroesophageal reflux disease without esophagitis: stable will continue prilosec 20 mg daily    3. Chronic constipation: stable will continue miralax twice daily   4.  Pharyngoesophageal dysphagia: has chronic aspiration: is currently NPO and is receiving jevity tube feeding. For his dry mouth will continue biotene every 4 hours while awake and will monitor his status.    PREVIOUS  5. CKD stage III: stable bun 13 creat 0.84  6.  Adult failure to thrive: weight is 146 pounds; albumin is 3.1; without change in status; will continue to monitor  7. History of right lung PE; without change he is not a candidate for anticoagulation therapy due to history of falls and subdural  hematoma.     10. Chronic systolic heart failure: EF 35-40%: is status post AICD placement:  is stable will continue lasix 20 mg daily with k+ 20 meq daily  lopressor 25 mg twice daily and will monitor his status.   11. Atrial fibrillation with controlled ventricular response: has history of PE  heart rate stable: is not a candidate for anticoagulations due to history of subdural hematoma: will continue lopressor 25 mg twice daily and digoxin 0.125 mg daily for rate control   12. Coronary artery disease involving native coronary artery with angina pectoris: is status post CABG/ stent placements: is stable will continue lopressor 25 mg twice daily and has prn ntg.   13. Hypertensive heart disease with heart failure: stable b/p 114/59 will continue lopressor 25 mg twice daily   14. COPD( emphysema): is stable: is now on 02 at 2L/[place] as needed to keep sats >91 %. Will continue advair 250/50 twice daily  spiriva 18 mcg daily duoneb every 4 hours as needed   15. Chronic allergic rhinitis: stable will continue atrovent nasal spray three times daily will monitor     MD is aware of resident's narcotic use and is in agreement with current plan of care. We will attempt to wean resident as apropriate  Ok Edwards NP [facility]  Contact [phone] Monday through Friday 8am- 5pm  After hours call [phone]

## 2017-11-05 DIAGNOSIS — F4321 Adjustment disorder with depressed mood: Secondary | ICD-10-CM | POA: Diagnosis not present

## 2017-11-14 ENCOUNTER — Encounter: Payer: Self-pay | Admitting: Adult Health

## 2017-11-14 ENCOUNTER — Non-Acute Institutional Stay (SKILLED_NURSING_FACILITY): Payer: Medicare Other | Admitting: Adult Health

## 2017-11-14 DIAGNOSIS — I5022 Chronic systolic (congestive) heart failure: Secondary | ICD-10-CM | POA: Diagnosis not present

## 2017-11-14 DIAGNOSIS — N183 Chronic kidney disease, stage 3 unspecified: Secondary | ICD-10-CM

## 2017-11-14 DIAGNOSIS — I2782 Chronic pulmonary embolism: Secondary | ICD-10-CM | POA: Diagnosis not present

## 2017-11-14 DIAGNOSIS — R627 Adult failure to thrive: Secondary | ICD-10-CM

## 2017-11-14 NOTE — Progress Notes (Signed)
Location:   [facility] Room Number: 205 A Place of Service:  SNF (31)   CODE STATUS: Full Code  Allergies  Allergen Reactions  . Tamsulosin Other (See Comments)    Dizziness, Made BP very low and weakness  . Celebrex [Celecoxib] Hives and Nausea And Vomiting    Gi upset  . Dronedarone Nausea And Vomiting and Other (See Comments)    GI upset, abdominal pain  . Esomeprazole Magnesium Hives and Other (See Comments)    "don't really remember"  . Digoxin Diarrhea    May have caused some diarrhea  . Hydrocodone-Acetaminophen Other (See Comments)    Bad headache  . Protonix [Pantoprazole Sodium] Nausea And Vomiting and Other (See Comments)    Tolerates Dexilant    Chief Complaint  Patient presents with  . Medical Management of Chronic Issues    Systolic heart failure; ckd stage III; adult failure to thrive; PE.     HPI:  He is a 84 year old long term resident of this facility being seen for the management of his chronic illnesses: systolic heart failure; ckd stage 3; adult failure to thrive and PE. He had been having diarrheal stools after his feeding; the feeding was changed at this time he denies any problems. He denies any abdominal pain; no uncontrolled pain.   Past Medical History:  Diagnosis Date  . Abnormal thyroid scan    Abnormal thyroid imaging studies from 11/09/2010, status post ultrasound guided fine needle aspiration of the dominant left inferior thyroid nodule on 12/15/2010. Cytology report showed rare follicular epithelial cells and hemosiderin laden macrophages.  . Adenomatous colon polyp   . AICD (automatic cardioverter/defibrillator) present    a. fx lead; a. s/p lead extraction 03/02/17  . Arthritis    "all over"  . Asthma   . Atrial fibrillation ([place])    on chronic Coumadin; stopped July 2013 due to subdural hematomas  . CHF (congestive heart failure) (HCC)    EF 35-40% s/p most recent ICD generator change-out with Medtronic  dual-chamber ICD 05/20/11 with explantation of previous abdominally-implanted device  . Chronic systolic heart failure- EF 35-40% 12/30/2009    Ejection fraction of 35-40% with an ischemic cardiomyopathy.  . Coronary artery disease    s/p CABG 1983 and PCI/stent 2004.   . Diabetes mellitus    diet controlled  . Diverticulosis   . Dyslipidemia   . Enteritis   . Erythrocytosis   . GERD (gastroesophageal reflux disease)   . Hepatic steatosis   . Hypertension   . Ischemic cardiomyopathy    WITH CHF  . Monocytosis 04/17/2013  . Myocardial infarction ([place]) 1983; ~ 1990  . Pleural effusion    right  . Pneumonia August 2013  . Portal hypertensive gastropathy ([place])   . Rectal ulcer   . Renal calculi   . Subdural hematoma [facility]) July 2013   Anticoagulation stopped.   . SunDown syndrome   . VT (ventricular tachycardia) ([place])     Past Surgical History:  Procedure Laterality Date  . CHOLECYSTECTOMY    . COLONOSCOPY  07/07/2011   Procedure: COLONOSCOPY;  Surgeon: Jerene Bears, MD;  Location: [facility];  Service: Gastroenterology;  Laterality: N/A;  . CORONARY ANGIOPLASTY WITH STENT PLACEMENT  2004   Tandem Cypher stents LAD  . CORONARY ARTERY BYPASS GRAFT  1983   SVG-mLAD  . ESOPHAGOGASTRODUODENOSCOPY  02/11/2011   Procedure: ESOPHAGOGASTRODUODENOSCOPY (EGD);  Surgeon: Beryle Beams, MD;  Location: [facility];  Service: Endoscopy;  Laterality: N/A;  . FLEXIBLE SIGMOIDOSCOPY N/A 02/08/2017   Procedure: FLEXIBLE SIGMOIDOSCOPY;  Surgeon: Irene Shipper, MD;  Location: [facility];  Service: Endoscopy;  Laterality: N/A;  . ICD GENERATOR CHANGEOUT N/A 04/08/2016   Procedure: ICD Generator Changeout;  Surgeon: Evans Lance, MD;  Location: [place] CV [facility];  Service: Cardiovascular;  Laterality: N/A;  . ICD LEAD REMOVAL N/A 03/02/2017   Procedure: ICD LEAD REMOVAL;  Surgeon: Evans Lance, MD;  Location: [place];  Service: Cardiovascular;  Laterality: N/A;  . IMPLANTABLE CARDIOVERTER  DEFIBRILLATOR (ICD) GENERATOR CHANGE N/A 05/20/2011   Procedure: ICD GENERATOR CHANGE;  Surgeon: Evans Lance, MD;  Medtronic secure dual-chamber ICD serial number [id number]   . IR GASTROSTOMY TUBE MOD SED  04/19/2017  . KNEE ARTHROSCOPY     right; "just went in and scraped it"  . LAPAROTOMY N/A 09/30/2017   Procedure: LAPAROTOMY, LYSIS OF ADHESION;  Surgeon: Rolm Bookbinder, MD;  Location: [place];  Service: General;  Laterality: N/A;  . LEAD INSERTION N/A 03/02/2017   Procedure: LEAD INSERTION;  Surgeon: Evans Lance, MD;  Location: [place];  Service: Cardiovascular;  Laterality: N/A;  . MASS EXCISION Right 05/10/2013   Procedure: EXCISION MASS RIGHT THUMB;  Surgeon: Wynonia Sours, MD;  Location: [place];  Service: Orthopedics;  Laterality: Right;  . PROXIMAL INTERPHALANGEAL FUSION (PIP) Right 05/10/2013   Procedure: DEBRIDEMENT PROXIMAL INTERPHALANGEAL FUSION (PIP);  Surgeon: Wynonia Sours, MD;  Location: [place];  Service: Orthopedics;  Laterality: Right;  . TONSILLECTOMY          Social History   Socioeconomic History  . Marital status: Divorced    Spouse name: Not on file  . Number of children: 2  . Years of education: Not on file  . Highest education level: Not on file  Occupational History  . Occupation: Sports coach: RETIRED  Social Needs  . Financial resource strain: Not hard at all  . Food insecurity:    Worry: Never true    Inability: Never true  . Transportation needs:    Medical: No    Non-medical: No  Tobacco Use  . Smoking status: Former Smoker    Packs/day: 2.00    Years: 30.00    Pack years: 60.00    Types: Cigarettes    Last attempt to quit: 06/23/1976    Years since quitting: 41.4  . Smokeless tobacco: Never Used  Substance and Sexual Activity  . Alcohol use: No  . Drug use: No  . Sexual activity: Never  Lifestyle  . Physical activity:    Days per week: 7 days    Minutes per session: 60 min  . Stress:  Not at all  Relationships  . Social connections:    Talks on phone: Once a week    Gets together: Once a week    Attends religious service: Never    Active member of club or organization: No    Attends meetings of clubs or organizations: Never    Relationship status: Divorced  . Intimate partner violence:    Fear of current or ex partner: No    Emotionally abused: No    Physically abused: No    Forced sexual activity: No  Other Topics Concern  . Not on file  Social History Narrative  . Not on file   Family History  Problem Relation Age of Onset  . Tuberculosis Mother   . Tuberculosis Father   .  Heart disease Brother   . Diabetes Sister   . Diabetes Brother   . Clotting disorder Brother       VITAL SIGNS BP 110/66   Pulse 74   Temp 98 F (36.7 C)   Resp 18   Ht _0  (1.854 m)   Wt 147 lb 3.2 oz (66.8 kg)   SpO2 95%   BMI 19.42 kg/m   Outpatient Encounter Medications as of 11/14/2017  Medication Sig  . acetaminophen (TYLENOL) 325 MG tablet Give 2 tablets via PEG - tube every 6 hours for stomach pain  . ADVAIR DISKUS 250-50 MCG/DOSE AEPB Inhale 1 puff into the lungs 2 (two) times daily.   . Artificial Saliva (BIOTENE MOISTURIZING MOUTH) SOLN Give 1 swab by mouth every 4 hours while awake  . digoxin (LANOXIN) 0.05 MG/ML solution Place 2.5 mLs (0.125 mg total) into feeding tube daily.  . furosemide (LASIX) 10 MG/ML solution Place 20 mg into feeding tube daily. Give 2 ml (63m) via PEG - Tube daily  . ipratropium (ATROVENT) 0.03 % nasal spray Place 1 spray into both nostrils 3 (three) times daily before meals.  .Marland Kitchenipratropium-albuterol (DUONEB) 0.5-2.5 (3) MG/3ML SOLN Take 3 mLs by nebulization every 4 (four) hours as needed.   . metoprolol tartrate (LOPRESSOR) 25 MG tablet Place 1 tablet (25 mg total) into feeding tube 2 (two) times daily.  . nitroGLYCERIN (NITROSTAT) 0.4 MG SL tablet Place 1 tablet (0.4 mg total) under the tongue every 5 (five) minutes as needed for  chest pain.  . NON FORMULARY NPO (Nothing by mouth diet) - Nothing by mouth texture  . Nutritional Supplements (FEEDING SUPPLEMENT, JEVITY 1.5 CAL,) LIQD Jevity 1.5 -  Give 237 ml every 4 hours per peg tube  . omeprazole (PRILOSEC) 20 MG capsule Give 1 capsule via Peg Tube daily  . ondansetron (ZOFRAN) 4 MG tablet Place 4 mg into feeding tube every 6 (six) hours as needed for nausea or vomiting.  . OXYGEN Inhale 2 L/min into the lungs daily as needed. To maintain sats less than 90%  . polyethylene glycol (MIRALAX / GLYCOLAX) packet Place 17 g into feeding tube 2 (two) times daily.   . potassium chloride 20 MEQ/15ML (10%) SOLN Place 20 mEq into feeding tube daily.  . RESTASIS 0.05 % ophthalmic emulsion INSTILL 1 DROP INTO BOTH EYES TWICE A DAY  . tiotropium (SPIRIVA) 18 MCG inhalation capsule Place 18 mcg into inhaler and inhale daily.  . Water For Irrigation, Sterile (FREE WATER) SOLN Place 120 mLs into feeding tube every 4 (four) hours. 120 ml H2o flushes via tube every 4 hours   No facility-administered encounter medications on file as of 11/14/2017.      SIGNIFICANT DIAGNOSTIC EXAMS  PREVIOUS:   03-26-17: ct of chest abdomen and pelvis: 1. No acute/traumatic intrathoracic, abdominal, or pelvic pathology. 2. Nonocclusive pulmonary embolus in the subsegmental right lower lobe pulmonary artery branches likely old PE or scarring. Nonocclusive acute PE is not entirely excluded. Clinical correlation is recommended. 3. Mild cardiomegaly with dilatation of the right atrium. 4. 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 Small right pleural effusion and stable right lung base round atelectasis/scarring. 5. Aortic Atherosclerosis  and Emphysema    04-07-17: chest x-ray: Suspected patchy infiltrate at the right base, short interval radiographic follow-up suggested to ensure clearing. Cardiomegaly.  04-09-17: chest x-ray: COPD, cardiomegaly, stable. Increasing right basilar  airspace opacity and small right effusion concerning for pneumonia  04-10-17: swallow study:  Recommend NPO with short term alternative nutrition and continued ST   04-19-17: PEG tube insertion   05-01-17: chest x-ray: mild right basilar infiltrate with trace right sided effusion  05-23-17: chest x-ray: COPD changes with mild atelectasis and small pleural effusion versus pleural thickening at RIGHT base. Improved RIGHT basilar infiltrates since previous exam.  06-19-17: swallow study: Recommend pt continue po and dysphagia treatment with SLP. Once compensation strategies are generalized, suspect pt may tolerate comfort po intake independently. As his strong "hock" is protective of his airway, suspect care plan may be able to be more liberal. Anticipate however that pt will continue to require PEG tube for nutrition.   09-30-17: ct of abdomen and pelvis:1. Severe small-bowel pneumatosis intestinalis with mesenteric venous gas. Resultant small bowel obstruction versus ileus. 2. Small amount of ascites. No abscess. 3. New lingular pneumonia. Stable rounded density RIGHT lung base most compatible with round pneumonia/scarring. New small RIGHT pleural effusion.   10-01-17: chest x-ray: 1. 6 mm thick hyperlucency along the periphery of the right mid lung may reflect a small pneumothorax, less than 5%. 2. Stable cardiomegaly with aortic atherosclerosis left basilar atelectasis. Probable small left effusion and chronic minimal blunting of the right lateral costophrenic angle which may reflect pleural thickening or chronic trace effusion.  10-10-17: chest x-ray: Slight enlargement of bilateral effusions since the study of 8 days ago. Associated basilar atelectasis.  10-10-17: ct of abdomen an pelvis: 1. No evidence for bowel obstruction. No bowel wall thickening. The pneumatosis and mesenteric gas seen on the previous exam has resolved in the interval. 2. Similar degree of mild to moderate intraperitoneal free  fluid, mainly in the pelvis. 3. Tiny gas bubble in the bladder lumen, likely related to recent instrumentation although bladder infection could have this appearance. 4. Body wall edema 5.  Aortic Atherosclerois  6. 7 mm nonobstructing stone lower pole left kidney  NO NEW EXAMS.     LABS REVIEWED: PREVIOUS:   04-07-17: wbc 9.5; hgb 13.4; hct 41.9; mcv 94.2; plt 101; glucose 84; bun 18; creat 1.28 ;k+ 4.0; na++ 136; ca 8.2; total bili 2.5; albumin 3.2; rapid flu: +A; blood culture: no growth; urine culture: <10,000 colonies 04-09-17: wbc 9.0; hgb 11.5; hct 35.1; mcv 91.9; plt 120; glucose 88; bun 9; creat 0.86; k+ 3.5; na++ 133; ca 7.5; mag 1.7 04-13-17: wbc 9.4; hgb 13.8; hct 42.6; mcv 94.0; plt 217; glucose 115; bun 11; creat 1.04 ;k+ 3.3; na++144; ca 8.7 04-16-17: wbc 6.5; hgb 12.2; hct 38.5; mcv 95.3; plt 185; glucose 115; bun 11; creat 0.78; k+ 4.0; na++ 143; ca 8.9; mag 1.7  04-20-17:wbc 12.4; hgb 12.2; hct 38.0; mcv 93.6; plt 209; glucose 110; bun 18; creat 0.95; k+ 4.7; na++ 136; ca 8.4; mag 2.7;  04-22-17: mag 1.9 05-02-17: wbc 7.1; hgb 11.8; hct 34.0; mcv 87.6; plt 223; glucose 130; bun 22.2; creat 0.71; k+ 4.4; na++ 143; ca 9.1; alk phos 123; albumin 3.4; tsh 0.91  06-16-17: wbc 8.7; hgb 13.9; hct 42.2; mcv 91.3; plt 179 07-11-17: H pylori: IgG AB: 0.43; IgA AB: 20.1 (hi): IgM abs: <9.0 09-29-17: wbc 13.9; hgb 14.4; hct 46.8; mcv 93.8; plt 158; glucose 139; bun 29; creat 1.14; k+ 4.4; na++ 139; ca 9.5 liver normal albumin 4.0  09-30-17: dig level: 1.1 10-02-17: wbc  15.1 hgb 12.9; hc6 42.5; mcv 95.1; plt 151 glucose 95; bun 25; creat 0.88; k+ 4.2; na++ 142; ca 8.5 10-04-17: wbc 8.2; hgb 11.9; hct 38.2; mcv 92.3; plt  149  glucose 125; bun 18; creat 0.71 k+ 3.4; na++142; ca 8.7; mag 1.4  10-10-17: wbc 74; hgb 11.5; hct 37.6; mcv 93.;8 plt 246; glucose 152; bun 13; creat 0.84; k+ 4.8; na++ 136; ca 8.7 liver normal albumin 3.1  Urine culture: no growth   NO NEW LABS.    Review of Systems    Constitutional: Negative for malaise/fatigue.  Respiratory: Negative for cough and shortness of breath.   Cardiovascular: Negative for chest pain, palpitations and leg swelling.  Gastrointestinal: Negative for abdominal pain, constipation and heartburn.  Musculoskeletal: Negative for back pain, joint pain and myalgias.  Skin: Negative.   Neurological: Negative for dizziness.  Psychiatric/Behavioral: The patient is not nervous/anxious.     Physical Exam  Constitutional: He is oriented to person, place, and time. No distress.  Frail   Neck: No thyromegaly present.  Cardiovascular: Normal rate, regular rhythm and intact distal pulses.  Murmur heard. 1/6  Pulmonary/Chest: Effort normal and breath sounds normal. No respiratory distress.  Abdominal: Soft. Bowel sounds are normal. He exhibits no distension. There is no tenderness.  Peg tube without signs of infection present   Musculoskeletal: Normal range of motion. He exhibits no edema.  Uses walker   Lymphadenopathy:    He has no cervical adenopathy.  Neurological: He is alert and oriented to person, place, and time.  Skin: Skin is warm and dry. He is not diaphoretic.  Psychiatric: He has a normal mood and affect.   .   ASSESSMENT/ PLAN:  TODAY;  1. CKD stage III: stable bun 13 creat 0.84  2.  Adult failure to thrive: weight is 147 pounds; albumin is 3.1; without change in status; will continue to monitor  3. Chronic pulmonary embolism right lung  without change he is not a candidate for anticoagulation therapy due to history of falls and subdural hematoma.     4. Chronic systolic heart failure: EF 35-40%: is status post AICD placement:  is stable will continue lasix 20 mg daily with k+ 20 meq daily  lopressor 25 mg twice daily and will monitor his status.   PREVIOUS  5. Atrial fibrillation with controlled ventricular response: has history of PE  heart rate stable: is not a candidate for anticoagulations due to history of  subdural hematoma: will continue lopressor 25 mg twice daily and digoxin 0.125 mg daily for rate control   6. Coronary artery disease involving native coronary artery with angina pectoris: is status post CABG/ stent placements: is stable will continue lopressor 25 mg twice daily and has prn ntg.   7. Hypertensive heart disease with heart failure: stable b/p 114/59 will continue lopressor 25 mg twice daily   8. COPD( emphysema): is stable: is now on 02 at 2L/[place] as needed to keep sats >91 %. Will continue advair 250/50 twice daily  spiriva 18 mcg daily duoneb every 4 hours as needed   9. Chronic allergic rhinitis: stable will continue atrovent nasal spray three times daily will monitor   10. Ischemic small bowel is status post exploratory  Laparoscopy: is stable will continue tylenol 650 mg every 6 hours   11. Gastroesophageal reflux disease without esophagitis: stable will continue prilosec 20 mg daily    12. Chronic constipation: stable will continue miralax twice daily   13.  Pharyngoesophageal dysphagia: has chronic aspiration: is currently NPO and is receiving jevity 1.5  tube feeding. For his dry mouth will continue biotene every 4 hours while awake and will monitor his status.  MD is aware of resident's narcotic use and is in agreement with current plan of care. We will attempt to wean resident as apropriate   Yonas Bunda NP [facility]  Contact [phone] Monday through Friday 8am- 5pm  After hours call [phone]  

## 2017-11-16 DIAGNOSIS — G3184 Mild cognitive impairment, so stated: Secondary | ICD-10-CM | POA: Diagnosis not present

## 2017-11-18 DIAGNOSIS — Z23 Encounter for immunization: Secondary | ICD-10-CM | POA: Diagnosis not present

## 2017-11-18 DIAGNOSIS — E119 Type 2 diabetes mellitus without complications: Secondary | ICD-10-CM | POA: Diagnosis not present

## 2017-11-18 LAB — MICROALBUMIN, URINE: Microalb, Ur: 1.2

## 2017-11-27 DIAGNOSIS — F4321 Adjustment disorder with depressed mood: Secondary | ICD-10-CM | POA: Diagnosis not present

## 2017-11-29 ENCOUNTER — Encounter: Payer: Self-pay | Admitting: Adult Health

## 2017-11-29 NOTE — Progress Notes (Signed)
Location:   [facility] Room Number: 205 A Place of Service:  SNF (31)   CODE STATUS: Full Code  Allergies  Allergen Reactions  . Tamsulosin Other (See Comments)    Dizziness, Made BP very low and weakness  . Celebrex [Celecoxib] Hives and Nausea And Vomiting    Gi upset  . Dronedarone Nausea And Vomiting and Other (See Comments)    GI upset, abdominal pain  . Esomeprazole Magnesium Hives and Other (See Comments)    "don't really remember"  . Digoxin Diarrhea    May have caused some diarrhea  . Hydrocodone-Acetaminophen Other (See Comments)    Bad headache  . Protonix [Pantoprazole Sodium] Nausea And Vomiting and Other (See Comments)    Tolerates Dexilant    Chief Complaint  Patient presents with  . Acute Visit    Care Plan Meeting    HPI:    Past Medical History:  Diagnosis Date  . Abnormal thyroid scan    Abnormal thyroid imaging studies from 11/09/2010, status post ultrasound guided fine needle aspiration of the dominant left inferior thyroid nodule on 12/15/2010. Cytology report showed rare follicular epithelial cells and hemosiderin laden macrophages.  . Adenomatous colon polyp   . AICD (automatic cardioverter/defibrillator) present    a. fx lead; a. s/p lead extraction 03/02/17  . Arthritis    "all over"  . Asthma   . Atrial fibrillation ([place])    on chronic Coumadin; stopped July 2013 due to subdural hematomas  . CHF (congestive heart failure) (HCC)    EF 35-40% s/p most recent ICD generator change-out with Medtronic dual-chamber ICD 05/20/11 with explantation of previous abdominally-implanted device  . Chronic systolic heart failure- EF 35-40% 12/30/2009    Ejection fraction of 35-40% with an ischemic cardiomyopathy.  . Coronary artery disease    s/p CABG 1983 and PCI/stent 2004.   . Diabetes mellitus    diet controlled  . Diverticulosis   . Dyslipidemia   . Enteritis   . Erythrocytosis   . GERD (gastroesophageal reflux disease)   .  Hepatic steatosis   . Hypertension   . Ischemic cardiomyopathy    WITH CHF  . Monocytosis 04/17/2013  . Myocardial infarction ([place]) 1983; ~ 1990  . Pleural effusion    right  . Pneumonia August 2013  . Portal hypertensive gastropathy ([place])   . Rectal ulcer   . Renal calculi   . Subdural hematoma [facility]) July 2013   Anticoagulation stopped.   . SunDown syndrome   . VT (ventricular tachycardia) ([place])     Past Surgical History:  Procedure Laterality Date  . CHOLECYSTECTOMY    . COLONOSCOPY  07/07/2011   Procedure: COLONOSCOPY;  Surgeon: Jerene Bears, MD;  Location: [facility] ENDOSCOPY;  Service: Gastroenterology;  Laterality: N/A;  . CORONARY ANGIOPLASTY WITH STENT PLACEMENT  2004   Tandem Cypher stents LAD  . CORONARY ARTERY BYPASS GRAFT  1983   SVG-mLAD  . ESOPHAGOGASTRODUODENOSCOPY  02/11/2011   Procedure: ESOPHAGOGASTRODUODENOSCOPY (EGD);  Surgeon: Beryle Beams, MD;  Location: [facility];  Service: Endoscopy;  Laterality: N/A;  . FLEXIBLE SIGMOIDOSCOPY N/A 02/08/2017   Procedure: FLEXIBLE SIGMOIDOSCOPY;  Surgeon: Irene Shipper, MD;  Location: [facility];  Service: Endoscopy;  Laterality: N/A;  . ICD GENERATOR CHANGEOUT N/A 04/08/2016   Procedure: ICD Generator Changeout;  Surgeon: Evans Lance, MD;  Location: [place] CV LAB;  Service: Cardiovascular;  Laterality: N/A;  . ICD LEAD REMOVAL N/A 03/02/2017   Procedure: ICD LEAD REMOVAL;  Surgeon: Evans Lance, MD;  Location: [place];  Service: Cardiovascular;  Laterality: N/A;  . IMPLANTABLE CARDIOVERTER DEFIBRILLATOR (ICD) GENERATOR CHANGE N/A 05/20/2011   Procedure: ICD GENERATOR CHANGE;  Surgeon: Evans Lance, MD;  Medtronic secure dual-chamber ICD serial number [id number]   . IR GASTROSTOMY TUBE MOD SED  04/19/2017  . KNEE ARTHROSCOPY     right; "just went in and scraped it"  . LAPAROTOMY N/A 09/30/2017   Procedure: LAPAROTOMY, LYSIS OF ADHESION;  Surgeon: Rolm Bookbinder, MD;  Location: [place];  Service: General;  Laterality:  N/A;  . LEAD INSERTION N/A 03/02/2017   Procedure: LEAD INSERTION;  Surgeon: Evans Lance, MD;  Location: [place];  Service: Cardiovascular;  Laterality: N/A;  . MASS EXCISION Right 05/10/2013   Procedure: EXCISION MASS RIGHT THUMB;  Surgeon: Wynonia Sours, MD;  Location: [place];  Service: Orthopedics;  Laterality: Right;  . PROXIMAL INTERPHALANGEAL FUSION (PIP) Right 05/10/2013   Procedure: DEBRIDEMENT PROXIMAL INTERPHALANGEAL FUSION (PIP);  Surgeon: Wynonia Sours, MD;  Location: [place];  Service: Orthopedics;  Laterality: Right;  . TONSILLECTOMY          Social History   Socioeconomic History  . Marital status: Divorced    Spouse name: Not on file  . Number of children: 2  . Years of education: Not on file  . Highest education level: Not on file  Occupational History  . Occupation: Sports coach: RETIRED  Social Needs  . Financial resource strain: Not hard at all  . Food insecurity:    Worry: Never true    Inability: Never true  . Transportation needs:    Medical: No    Non-medical: No  Tobacco Use  . Smoking status: Former Smoker    Packs/day: 2.00    Years: 30.00    Pack years: 60.00    Types: Cigarettes    Last attempt to quit: 06/23/1976    Years since quitting: 41.4  . Smokeless tobacco: Never Used  Substance and Sexual Activity  . Alcohol use: No  . Drug use: No  . Sexual activity: Never  Lifestyle  . Physical activity:    Days per week: 7 days    Minutes per session: 60 min  . Stress: Not at all  Relationships  . Social connections:    Talks on phone: Once a week    Gets together: Once a week    Attends religious service: Never    Active member of club or organization: No    Attends meetings of clubs or organizations: Never    Relationship status: Divorced  . Intimate partner violence:    Fear of current or ex partner: No    Emotionally abused: No    Physically abused: No    Forced sexual activity: No    Other Topics Concern  . Not on file  Social History Narrative  . Not on file   Family History  Problem Relation Age of Onset  . Tuberculosis Mother   . Tuberculosis Father   . Heart disease Brother   . Diabetes Sister   . Diabetes Brother   . Clotting disorder Brother       VITAL SIGNS BP (!) 114/56   Pulse (!) 55   Temp (!) 97.1 F (36.2 C)   Resp 18   Ht '6\' 1"'  (1.854 m)   Wt 147 lb 3.2 oz (66.8 kg)   SpO2 98%   BMI 19.42  kg/m   Outpatient Encounter Medications as of 11/29/2017  Medication Sig  . acetaminophen (TYLENOL) 325 MG tablet Give 2 tablets via PEG - tube every 6 hours for stomach pain  . ADVAIR DISKUS 250-50 MCG/DOSE AEPB Inhale 1 puff into the lungs 2 (two) times daily.   . Artificial Saliva (BIOTENE MOISTURIZING MOUTH) SOLN Give 1 swab by mouth every 4 hours while awake  . digoxin (LANOXIN) 0.05 MG/ML solution Place 2.5 mLs (0.125 mg total) into feeding tube daily.  . furosemide (LASIX) 10 MG/ML solution Place 20 mg into feeding tube daily. Give 2 ml (51m) via PEG - Tube daily  . ipratropium (ATROVENT) 0.03 % nasal spray Place 1 spray into both nostrils 3 (three) times daily before meals.  .Marland Kitchenipratropium-albuterol (DUONEB) 0.5-2.5 (3) MG/3ML SOLN Take 3 mLs by nebulization every 4 (four) hours as needed.   . metoprolol tartrate (LOPRESSOR) 25 MG tablet Place 1 tablet (25 mg total) into feeding tube 2 (two) times daily.  . nitroGLYCERIN (NITROSTAT) 0.4 MG SL tablet Place 1 tablet (0.4 mg total) under the tongue every 5 (five) minutes as needed for chest pain.  . NON FORMULARY Regular diet - Pureed Texture, Nectar thickened Fluids consistency.   Patient may have water (thin) and pleasure foods (puree texture and nectar thick liquids upon request  . Nutritional Supplements (FEEDING SUPPLEMENT, JEVITY 1.5 CAL,) LIQD Jevity 1.5 -  Give 237 ml every 4 hours per peg tube  . omeprazole (PRILOSEC) 20 MG capsule Give 1 capsule via Peg Tube daily  . ondansetron (ZOFRAN)  4 MG tablet Place 4 mg into feeding tube every 6 (six) hours as needed for nausea or vomiting.  . OXYGEN Inhale 2 L/min into the lungs daily as needed. To maintain sats less than 90%  . polyethylene glycol (MIRALAX / GLYCOLAX) packet Place 17 g into feeding tube 2 (two) times daily.   . potassium chloride 20 MEQ/15ML (10%) SOLN Place 20 mEq into feeding tube daily.  . RESTASIS 0.05 % ophthalmic emulsion INSTILL 1 DROP INTO BOTH EYES TWICE A DAY  . tiotropium (SPIRIVA) 18 MCG inhalation capsule Place 18 mcg into inhaler and inhale daily.  . Water For Irrigation, Sterile (FREE WATER) SOLN Place 120 mLs into feeding tube every 4 (four) hours. 120 ml H2o flushes via tube every 4 hours   No facility-administered encounter medications on file as of 11/29/2017.      SIGNIFICANT DIAGNOSTIC EXAMS  PREVIOUS:   03-26-17: ct of chest abdomen and pelvis: 1. No acute/traumatic intrathoracic, abdominal, or pelvic pathology. 2. Nonocclusive pulmonary embolus in the subsegmental right lower lobe pulmonary artery branches likely old PE or scarring. Nonocclusive acute PE is not entirely excluded. Clinical correlation is recommended. 3. Mild cardiomegaly with dilatation of the right atrium. 4. 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 Small right pleural effusion and stable right lung base round atelectasis/scarring. 5. Aortic Atherosclerosis  and Emphysema    04-07-17: chest x-ray: Suspected patchy infiltrate at the right base, short interval radiographic follow-up suggested to ensure clearing. Cardiomegaly.  04-09-17: chest x-ray: COPD, cardiomegaly, stable. Increasing right basilar airspace opacity and small right effusion concerning for pneumonia  04-10-17: swallow study: Recommend NPO with short term alternative nutrition and continued ST   04-19-17: PEG tube insertion   05-01-17: chest x-ray: mild right basilar infiltrate with trace right sided effusion  05-23-17: chest x-ray: COPD  changes with mild atelectasis and small pleural effusion versus pleural thickening at RIGHT base. Improved RIGHT  basilar infiltrates since previous exam.  06-19-17: swallow study: Recommend pt continue po and dysphagia treatment with SLP. Once compensation strategies are generalized, suspect pt may tolerate comfort po intake independently. As his strong "hock" is protective of his airway, suspect care plan may be able to be more liberal. Anticipate however that pt will continue to require PEG tube for nutrition.   09-30-17: ct of abdomen and pelvis:1. Severe small-bowel pneumatosis intestinalis with mesenteric venous gas. Resultant small bowel obstruction versus ileus. 2. Small amount of ascites. No abscess. 3. New lingular pneumonia. Stable rounded density RIGHT lung base most compatible with round pneumonia/scarring. New small RIGHT pleural effusion.   10-01-17: chest x-ray: 1. 6 mm thick hyperlucency along the periphery of the right mid lung may reflect a small pneumothorax, less than 5%. 2. Stable cardiomegaly with aortic atherosclerosis left basilar atelectasis. Probable small left effusion and chronic minimal blunting of the right lateral costophrenic angle which may reflect pleural thickening or chronic trace effusion.  10-10-17: chest x-ray: Slight enlargement of bilateral effusions since the study of 8 days ago. Associated basilar atelectasis.  10-10-17: ct of abdomen an pelvis: 1. No evidence for bowel obstruction. No bowel wall thickening. The pneumatosis and mesenteric gas seen on the previous exam has resolved in the interval. 2. Similar degree of mild to moderate intraperitoneal free fluid, mainly in the pelvis. 3. Tiny gas bubble in the bladder lumen, likely related to recent instrumentation although bladder infection could have this appearance. 4. Body wall edema 5.  Aortic Atherosclerois  6. 7 mm nonobstructing stone lower pole left kidney  NO NEW EXAMS.     LABS REVIEWED:  PREVIOUS:   04-07-17: wbc 9.5; hgb 13.4; hct 41.9; mcv 94.2; plt 101; glucose 84; bun 18; creat 1.28 ;k+ 4.0; na++ 136; ca 8.2; total bili 2.5; albumin 3.2; rapid flu: +A; blood culture: no growth; urine culture: <10,000 colonies 04-09-17: wbc 9.0; hgb 11.5; hct 35.1; mcv 91.9; plt 120; glucose 88; bun 9; creat 0.86; k+ 3.5; na++ 133; ca 7.5; mag 1.7 04-13-17: wbc 9.4; hgb 13.8; hct 42.6; mcv 94.0; plt 217; glucose 115; bun 11; creat 1.04 ;k+ 3.3; na++144; ca 8.7 04-16-17: wbc 6.5; hgb 12.2; hct 38.5; mcv 95.3; plt 185; glucose 115; bun 11; creat 0.78; k+ 4.0; na++ 143; ca 8.9; mag 1.7  04-20-17:wbc 12.4; hgb 12.2; hct 38.0; mcv 93.6; plt 209; glucose 110; bun 18; creat 0.95; k+ 4.7; na++ 136; ca 8.4; mag 2.7;  04-22-17: mag 1.9 05-02-17: wbc 7.1; hgb 11.8; hct 34.0; mcv 87.6; plt 223; glucose 130; bun 22.2; creat 0.71; k+ 4.4; na++ 143; ca 9.1; alk phos 123; albumin 3.4; tsh 0.91  06-16-17: wbc 8.7; hgb 13.9; hct 42.2; mcv 91.3; plt 179 07-11-17: H pylori: IgG AB: 0.43; IgA AB: 20.1 (hi): IgM abs: <9.0 09-29-17: wbc 13.9; hgb 14.4; hct 46.8; mcv 93.8; plt 158; glucose 139; bun 29; creat 1.14; k+ 4.4; na++ 139; ca 9.5 liver normal albumin 4.0  09-30-17: dig level: 1.1 10-02-17: wbc  15.1 hgb 12.9; hc6 42.5; mcv 95.1; plt 151 glucose 95; bun 25; creat 0.88; k+ 4.2; na++ 142; ca 8.5 10-04-17: wbc 8.2; hgb 11.9; hct 38.2; mcv 92.3; plt  149 glucose 125; bun 18; creat 0.71 k+ 3.4; na++142; ca 8.7; mag 1.4  10-10-17: wbc 74; hgb 11.5; hct 37.6; mcv 93.;8 plt 246; glucose 152; bun 13; creat 0.84; k+ 4.8; na++ 136; ca 8.7 liver normal albumin 3.1  Urine culture: no growth   NO NEW LABS.  Review of Systems  Constitutional: Negative for malaise/fatigue.  Respiratory: Negative for cough and shortness of breath.   Cardiovascular: Negative for chest pain, palpitations and leg swelling.  Gastrointestinal: Negative for abdominal pain, constipation and heartburn.  Musculoskeletal: Negative for back pain, joint pain and  myalgias.  Skin: Negative.   Neurological: Negative for dizziness.  Psychiatric/Behavioral: The patient is not nervous/anxious.     Physical Exam  Constitutional: He is oriented to person, place, and time. No distress.  Frail   Neck: No thyromegaly present.  Cardiovascular: Normal rate, regular rhythm and intact distal pulses.  Murmur heard. 1/6  Pulmonary/Chest: Effort normal and breath sounds normal. No respiratory distress.  Abdominal: Soft. Bowel sounds are normal. He exhibits no distension. There is no tenderness.  Peg tube without signs of infection present   Musculoskeletal: Normal range of motion. He exhibits no edema.  Uses walker   Lymphadenopathy:    He has no cervical adenopathy.  Neurological: He is alert and oriented to person, place, and time.  Skin: Skin is warm and dry. He is not diaphoretic.  Psychiatric: He has a normal mood and affect.   .   ASSESSMENT/ PLAN:  TODAY;  1. CKD stage III: stable bun 13 creat 0.84  2.  Adult failure to thrive: weight is 147 pounds; albumin is 3.1; without change in status; will continue to monitor  3. Chronic pulmonary embolism right lung  without change he is not a candidate for anticoagulation therapy due to history of falls and subdural hematoma.     4. Chronic systolic heart failure: EF 35-40%: is status post AICD placement:  is stable will continue lasix 20 mg daily with k+ 20 meq daily  lopressor 25 mg twice daily and will monitor his status.   PREVIOUS  5. Atrial fibrillation with controlled ventricular response: has history of PE  heart rate stable: is not a candidate for anticoagulations due to history of subdural hematoma: will continue lopressor 25 mg twice daily and digoxin 0.125 mg daily for rate control   6. Coronary artery disease involving native coronary artery with angina pectoris: is status post CABG/ stent placements: is stable will continue lopressor 25 mg twice daily and has prn ntg.   7. Hypertensive  heart disease with heart failure: stable b/p 114/59 will continue lopressor 25 mg twice daily   8. COPD( emphysema): is stable: is now on 02 at 2L/[place] as needed to keep sats >91 %. Will continue advair 250/50 twice daily  spiriva 18 mcg daily duoneb every 4 hours as needed   9. Chronic allergic rhinitis: stable will continue atrovent nasal spray three times daily will monitor   10. Ischemic small bowel is status post exploratory  Laparoscopy: is stable will continue tylenol 650 mg every 6 hours   11. Gastroesophageal reflux disease without esophagitis: stable will continue prilosec 20 mg daily    12. Chronic constipation: stable will continue miralax twice daily   13.  Pharyngoesophageal dysphagia: has chronic aspiration: is currently NPO and is receiving jevity 1.5  tube feeding. For his dry mouth will continue biotene every 4 hours while awake and will monitor his status.    MD is aware of resident's narcotic use and is in agreement with current plan of care. We will attempt to wean resident as apropriate   Ok Edwards NP [facility]  Contact [phone] Monday through Friday 8am- 5pm  After hours call [phone]

## 2017-12-02 NOTE — Progress Notes (Signed)
This encounter was created in error - please disregard.

## 2017-12-11 DIAGNOSIS — F4321 Adjustment disorder with depressed mood: Secondary | ICD-10-CM | POA: Diagnosis not present

## 2017-12-19 DIAGNOSIS — F4321 Adjustment disorder with depressed mood: Secondary | ICD-10-CM | POA: Diagnosis not present

## 2017-12-21 DIAGNOSIS — I482 Chronic atrial fibrillation, unspecified: Secondary | ICD-10-CM | POA: Diagnosis not present

## 2017-12-21 DIAGNOSIS — K219 Gastro-esophageal reflux disease without esophagitis: Secondary | ICD-10-CM | POA: Diagnosis not present

## 2017-12-21 DIAGNOSIS — R262 Difficulty in walking, not elsewhere classified: Secondary | ICD-10-CM | POA: Diagnosis not present

## 2017-12-21 DIAGNOSIS — L603 Nail dystrophy: Secondary | ICD-10-CM | POA: Diagnosis not present

## 2017-12-21 DIAGNOSIS — I739 Peripheral vascular disease, unspecified: Secondary | ICD-10-CM | POA: Diagnosis not present

## 2017-12-21 DIAGNOSIS — I10 Essential (primary) hypertension: Secondary | ICD-10-CM | POA: Diagnosis not present

## 2017-12-21 DIAGNOSIS — I5020 Unspecified systolic (congestive) heart failure: Secondary | ICD-10-CM | POA: Diagnosis not present

## 2018-01-01 ENCOUNTER — Ambulatory Visit (INDEPENDENT_AMBULATORY_CARE_PROVIDER_SITE_OTHER): Payer: Medicare Other | Admitting: *Deleted

## 2018-01-01 DIAGNOSIS — I5022 Chronic systolic (congestive) heart failure: Secondary | ICD-10-CM | POA: Diagnosis not present

## 2018-01-01 DIAGNOSIS — F4321 Adjustment disorder with depressed mood: Secondary | ICD-10-CM | POA: Diagnosis not present

## 2018-01-01 LAB — CUP PACEART INCLINIC DEVICE CHECK
Battery Remaining Longevity: 125 mo
Battery Voltage: 3 V
Date Time Interrogation Session: 20191202110311
HighPow Impedance: 254 Ohm
HighPow Impedance: 60 Ohm
Implantable Lead Implant Date: 20190131
Implantable Pulse Generator Implant Date: 20180309
Lead Channel Impedance Value: 323 Ohm
Lead Channel Sensing Intrinsic Amplitude: 21.25 mV
Lead Channel Setting Pacing Amplitude: 2.5 V
Lead Channel Setting Pacing Pulse Width: 0.4 ms
MDC IDC LEAD LOCATION: 753860
MDC IDC MSMT LEADCHNL RV IMPEDANCE VALUE: 399 Ohm
MDC IDC MSMT LEADCHNL RV PACING THRESHOLD AMPLITUDE: 0.75 V
MDC IDC MSMT LEADCHNL RV PACING THRESHOLD PULSEWIDTH: 0.4 ms
MDC IDC SET LEADCHNL RV SENSING SENSITIVITY: 0.3 mV
MDC IDC STAT BRADY RV PERCENT PACED: 15.88 %

## 2018-01-01 NOTE — Progress Notes (Signed)
ICD check in clinic. Normal device function. Threshold and sensing consistent with previous device measurements. Impedance trend stable over time. No evidence of any ventricular arrhythmias. Permanent AF, No OAC due to history of SDH. Histogram distribution appropriate for patient and level of activity. No changes made this session. Device programmed at appropriate safety margins. Device programmed to optimize intrinsic conduction. Estimated longevity 10.4 years. Optivol elevated above baseline but below threshold, possible fluid noted November 9th- 19th, patient reports no edema or SOB noted. Patient education completed including shock plan. Alert tones demonstrated for patient. ROV with Device Clinic 04/02/18

## 2018-01-03 DIAGNOSIS — R627 Adult failure to thrive: Secondary | ICD-10-CM | POA: Diagnosis not present

## 2018-01-03 DIAGNOSIS — I482 Chronic atrial fibrillation, unspecified: Secondary | ICD-10-CM | POA: Diagnosis not present

## 2018-01-03 DIAGNOSIS — I5022 Chronic systolic (congestive) heart failure: Secondary | ICD-10-CM | POA: Diagnosis not present

## 2018-01-03 DIAGNOSIS — M6281 Muscle weakness (generalized): Secondary | ICD-10-CM | POA: Diagnosis not present

## 2018-01-04 DIAGNOSIS — D649 Anemia, unspecified: Secondary | ICD-10-CM | POA: Diagnosis not present

## 2018-01-04 DIAGNOSIS — E119 Type 2 diabetes mellitus without complications: Secondary | ICD-10-CM | POA: Diagnosis not present

## 2018-01-04 DIAGNOSIS — E039 Hypothyroidism, unspecified: Secondary | ICD-10-CM | POA: Diagnosis not present

## 2018-01-04 DIAGNOSIS — E785 Hyperlipidemia, unspecified: Secondary | ICD-10-CM | POA: Diagnosis not present

## 2018-01-04 DIAGNOSIS — E559 Vitamin D deficiency, unspecified: Secondary | ICD-10-CM | POA: Diagnosis not present

## 2018-01-04 DIAGNOSIS — N183 Chronic kidney disease, stage 3 (moderate): Secondary | ICD-10-CM | POA: Diagnosis not present

## 2018-01-04 DIAGNOSIS — I4891 Unspecified atrial fibrillation: Secondary | ICD-10-CM | POA: Diagnosis not present

## 2018-01-04 DIAGNOSIS — I10 Essential (primary) hypertension: Secondary | ICD-10-CM | POA: Diagnosis not present

## 2018-01-04 DIAGNOSIS — I482 Chronic atrial fibrillation, unspecified: Secondary | ICD-10-CM | POA: Diagnosis not present

## 2018-01-10 DIAGNOSIS — F4321 Adjustment disorder with depressed mood: Secondary | ICD-10-CM | POA: Diagnosis not present

## 2018-01-11 DIAGNOSIS — M25532 Pain in left wrist: Secondary | ICD-10-CM | POA: Diagnosis not present

## 2018-01-11 DIAGNOSIS — M6281 Muscle weakness (generalized): Secondary | ICD-10-CM | POA: Diagnosis not present

## 2018-01-13 DIAGNOSIS — M25532 Pain in left wrist: Secondary | ICD-10-CM | POA: Diagnosis not present

## 2018-01-13 DIAGNOSIS — M6281 Muscle weakness (generalized): Secondary | ICD-10-CM | POA: Diagnosis not present

## 2018-01-15 DIAGNOSIS — M6281 Muscle weakness (generalized): Secondary | ICD-10-CM | POA: Diagnosis not present

## 2018-01-15 DIAGNOSIS — M25532 Pain in left wrist: Secondary | ICD-10-CM | POA: Diagnosis not present

## 2018-01-15 DIAGNOSIS — F4321 Adjustment disorder with depressed mood: Secondary | ICD-10-CM | POA: Diagnosis not present

## 2018-01-16 DIAGNOSIS — G3184 Mild cognitive impairment, so stated: Secondary | ICD-10-CM | POA: Diagnosis not present

## 2018-01-17 DIAGNOSIS — M25532 Pain in left wrist: Secondary | ICD-10-CM | POA: Diagnosis not present

## 2018-01-17 DIAGNOSIS — M6281 Muscle weakness (generalized): Secondary | ICD-10-CM | POA: Diagnosis not present

## 2018-01-18 DIAGNOSIS — I10 Essential (primary) hypertension: Secondary | ICD-10-CM | POA: Diagnosis not present

## 2018-01-18 DIAGNOSIS — M25532 Pain in left wrist: Secondary | ICD-10-CM | POA: Diagnosis not present

## 2018-01-18 DIAGNOSIS — N182 Chronic kidney disease, stage 2 (mild): Secondary | ICD-10-CM | POA: Diagnosis not present

## 2018-01-18 DIAGNOSIS — I482 Chronic atrial fibrillation, unspecified: Secondary | ICD-10-CM | POA: Diagnosis not present

## 2018-01-18 DIAGNOSIS — M6281 Muscle weakness (generalized): Secondary | ICD-10-CM | POA: Diagnosis not present

## 2018-01-19 DIAGNOSIS — M6281 Muscle weakness (generalized): Secondary | ICD-10-CM | POA: Diagnosis not present

## 2018-01-19 DIAGNOSIS — M25532 Pain in left wrist: Secondary | ICD-10-CM | POA: Diagnosis not present

## 2018-01-20 DIAGNOSIS — M6281 Muscle weakness (generalized): Secondary | ICD-10-CM | POA: Diagnosis not present

## 2018-01-20 DIAGNOSIS — M25532 Pain in left wrist: Secondary | ICD-10-CM | POA: Diagnosis not present

## 2018-01-22 DIAGNOSIS — I10 Essential (primary) hypertension: Secondary | ICD-10-CM | POA: Diagnosis not present

## 2018-01-22 DIAGNOSIS — E119 Type 2 diabetes mellitus without complications: Secondary | ICD-10-CM | POA: Diagnosis not present

## 2018-01-22 DIAGNOSIS — I4891 Unspecified atrial fibrillation: Secondary | ICD-10-CM | POA: Diagnosis not present

## 2018-01-22 DIAGNOSIS — Z79899 Other long term (current) drug therapy: Secondary | ICD-10-CM | POA: Diagnosis not present

## 2018-01-22 DIAGNOSIS — M6281 Muscle weakness (generalized): Secondary | ICD-10-CM | POA: Diagnosis not present

## 2018-01-22 DIAGNOSIS — M25532 Pain in left wrist: Secondary | ICD-10-CM | POA: Diagnosis not present

## 2018-01-22 DIAGNOSIS — I482 Chronic atrial fibrillation, unspecified: Secondary | ICD-10-CM | POA: Diagnosis not present

## 2018-01-22 DIAGNOSIS — N183 Chronic kidney disease, stage 3 (moderate): Secondary | ICD-10-CM | POA: Diagnosis not present

## 2018-01-23 DIAGNOSIS — M6281 Muscle weakness (generalized): Secondary | ICD-10-CM | POA: Diagnosis not present

## 2018-01-23 DIAGNOSIS — M25532 Pain in left wrist: Secondary | ICD-10-CM | POA: Diagnosis not present

## 2018-01-25 DIAGNOSIS — M6281 Muscle weakness (generalized): Secondary | ICD-10-CM | POA: Diagnosis not present

## 2018-01-25 DIAGNOSIS — M25532 Pain in left wrist: Secondary | ICD-10-CM | POA: Diagnosis not present

## 2018-01-26 DIAGNOSIS — M19032 Primary osteoarthritis, left wrist: Secondary | ICD-10-CM | POA: Diagnosis not present

## 2018-01-31 DIAGNOSIS — M6281 Muscle weakness (generalized): Secondary | ICD-10-CM | POA: Diagnosis not present

## 2018-01-31 DIAGNOSIS — M25532 Pain in left wrist: Secondary | ICD-10-CM | POA: Diagnosis not present

## 2018-02-01 DIAGNOSIS — M6281 Muscle weakness (generalized): Secondary | ICD-10-CM | POA: Diagnosis not present

## 2018-02-01 DIAGNOSIS — M25532 Pain in left wrist: Secondary | ICD-10-CM | POA: Diagnosis not present

## 2018-02-02 DIAGNOSIS — I5022 Chronic systolic (congestive) heart failure: Secondary | ICD-10-CM | POA: Diagnosis not present

## 2018-02-02 DIAGNOSIS — M6281 Muscle weakness (generalized): Secondary | ICD-10-CM | POA: Diagnosis not present

## 2018-02-02 DIAGNOSIS — I10 Essential (primary) hypertension: Secondary | ICD-10-CM | POA: Diagnosis not present

## 2018-02-02 DIAGNOSIS — M25532 Pain in left wrist: Secondary | ICD-10-CM | POA: Diagnosis not present

## 2018-02-02 DIAGNOSIS — I482 Chronic atrial fibrillation, unspecified: Secondary | ICD-10-CM | POA: Diagnosis not present

## 2018-02-03 DIAGNOSIS — F4321 Adjustment disorder with depressed mood: Secondary | ICD-10-CM | POA: Diagnosis not present

## 2018-02-04 DIAGNOSIS — M6281 Muscle weakness (generalized): Secondary | ICD-10-CM | POA: Diagnosis not present

## 2018-02-04 DIAGNOSIS — M25532 Pain in left wrist: Secondary | ICD-10-CM | POA: Diagnosis not present

## 2018-02-06 DIAGNOSIS — M25532 Pain in left wrist: Secondary | ICD-10-CM | POA: Diagnosis not present

## 2018-02-06 DIAGNOSIS — M6281 Muscle weakness (generalized): Secondary | ICD-10-CM | POA: Diagnosis not present

## 2018-02-07 DIAGNOSIS — F4321 Adjustment disorder with depressed mood: Secondary | ICD-10-CM | POA: Diagnosis not present

## 2018-02-08 DIAGNOSIS — G3184 Mild cognitive impairment, so stated: Secondary | ICD-10-CM | POA: Diagnosis not present

## 2018-02-08 DIAGNOSIS — M6281 Muscle weakness (generalized): Secondary | ICD-10-CM | POA: Diagnosis not present

## 2018-02-08 DIAGNOSIS — M25532 Pain in left wrist: Secondary | ICD-10-CM | POA: Diagnosis not present

## 2018-02-09 DIAGNOSIS — M25532 Pain in left wrist: Secondary | ICD-10-CM | POA: Diagnosis not present

## 2018-02-09 DIAGNOSIS — M6281 Muscle weakness (generalized): Secondary | ICD-10-CM | POA: Diagnosis not present

## 2018-02-10 DIAGNOSIS — M6281 Muscle weakness (generalized): Secondary | ICD-10-CM | POA: Diagnosis not present

## 2018-02-10 DIAGNOSIS — M25532 Pain in left wrist: Secondary | ICD-10-CM | POA: Diagnosis not present

## 2018-02-12 ENCOUNTER — Encounter: Payer: Self-pay | Admitting: Internal Medicine

## 2018-02-13 DIAGNOSIS — F4321 Adjustment disorder with depressed mood: Secondary | ICD-10-CM | POA: Diagnosis not present

## 2018-02-15 DIAGNOSIS — I10 Essential (primary) hypertension: Secondary | ICD-10-CM | POA: Diagnosis not present

## 2018-02-15 DIAGNOSIS — I5022 Chronic systolic (congestive) heart failure: Secondary | ICD-10-CM | POA: Diagnosis not present

## 2018-02-15 DIAGNOSIS — I482 Chronic atrial fibrillation, unspecified: Secondary | ICD-10-CM | POA: Diagnosis not present

## 2018-02-15 DIAGNOSIS — M6281 Muscle weakness (generalized): Secondary | ICD-10-CM | POA: Diagnosis not present

## 2018-02-19 DIAGNOSIS — F4321 Adjustment disorder with depressed mood: Secondary | ICD-10-CM | POA: Diagnosis not present

## 2018-02-20 DIAGNOSIS — Z79899 Other long term (current) drug therapy: Secondary | ICD-10-CM | POA: Diagnosis not present

## 2018-02-20 DIAGNOSIS — D649 Anemia, unspecified: Secondary | ICD-10-CM | POA: Diagnosis not present

## 2018-02-20 DIAGNOSIS — I4891 Unspecified atrial fibrillation: Secondary | ICD-10-CM | POA: Diagnosis not present

## 2018-02-20 DIAGNOSIS — I10 Essential (primary) hypertension: Secondary | ICD-10-CM | POA: Diagnosis not present

## 2018-02-20 DIAGNOSIS — I2510 Atherosclerotic heart disease of native coronary artery without angina pectoris: Secondary | ICD-10-CM | POA: Diagnosis not present

## 2018-02-20 DIAGNOSIS — N183 Chronic kidney disease, stage 3 (moderate): Secondary | ICD-10-CM | POA: Diagnosis not present

## 2018-03-01 DIAGNOSIS — F4321 Adjustment disorder with depressed mood: Secondary | ICD-10-CM | POA: Diagnosis not present

## 2018-03-02 DIAGNOSIS — M6281 Muscle weakness (generalized): Secondary | ICD-10-CM | POA: Diagnosis not present

## 2018-03-02 DIAGNOSIS — I10 Essential (primary) hypertension: Secondary | ICD-10-CM | POA: Diagnosis not present

## 2018-03-02 DIAGNOSIS — I482 Chronic atrial fibrillation, unspecified: Secondary | ICD-10-CM | POA: Diagnosis not present

## 2018-03-02 DIAGNOSIS — I2510 Atherosclerotic heart disease of native coronary artery without angina pectoris: Secondary | ICD-10-CM | POA: Diagnosis not present

## 2018-03-07 DIAGNOSIS — F4321 Adjustment disorder with depressed mood: Secondary | ICD-10-CM | POA: Diagnosis not present

## 2018-03-08 ENCOUNTER — Emergency Department (HOSPITAL_COMMUNITY): Payer: Medicare Other

## 2018-03-08 ENCOUNTER — Emergency Department (HOSPITAL_COMMUNITY)
Admission: EM | Admit: 2018-03-08 | Discharge: 2018-03-08 | Disposition: A | Discharge status: Skilled Nursing Facility | Payer: Medicare Other | Attending: Emergency Medicine | Admitting: Emergency Medicine

## 2018-03-08 DIAGNOSIS — J9600 Acute respiratory failure, unspecified whether with hypoxia or hypercapnia: Secondary | ICD-10-CM | POA: Diagnosis not present

## 2018-03-08 DIAGNOSIS — I200 Unstable angina: Secondary | ICD-10-CM | POA: Diagnosis not present

## 2018-03-08 DIAGNOSIS — J45909 Unspecified asthma, uncomplicated: Secondary | ICD-10-CM | POA: Diagnosis not present

## 2018-03-08 DIAGNOSIS — I110 Hypertensive heart disease with heart failure: Secondary | ICD-10-CM | POA: Diagnosis not present

## 2018-03-08 DIAGNOSIS — I4891 Unspecified atrial fibrillation: Secondary | ICD-10-CM | POA: Diagnosis not present

## 2018-03-08 DIAGNOSIS — Z431 Encounter for attention to gastrostomy: Secondary | ICD-10-CM

## 2018-03-08 DIAGNOSIS — Z7901 Long term (current) use of anticoagulants: Secondary | ICD-10-CM | POA: Diagnosis not present

## 2018-03-08 DIAGNOSIS — R109 Unspecified abdominal pain: Secondary | ICD-10-CM | POA: Diagnosis not present

## 2018-03-08 DIAGNOSIS — Z743 Need for continuous supervision: Secondary | ICD-10-CM | POA: Diagnosis not present

## 2018-03-08 DIAGNOSIS — Z79899 Other long term (current) drug therapy: Secondary | ICD-10-CM | POA: Insufficient documentation

## 2018-03-08 DIAGNOSIS — I25810 Atherosclerosis of coronary artery bypass graft(s) without angina pectoris: Secondary | ICD-10-CM | POA: Insufficient documentation

## 2018-03-08 DIAGNOSIS — I5022 Chronic systolic (congestive) heart failure: Secondary | ICD-10-CM | POA: Diagnosis not present

## 2018-03-08 DIAGNOSIS — I499 Cardiac arrhythmia, unspecified: Secondary | ICD-10-CM | POA: Diagnosis not present

## 2018-03-08 DIAGNOSIS — Z87891 Personal history of nicotine dependence: Secondary | ICD-10-CM | POA: Insufficient documentation

## 2018-03-08 DIAGNOSIS — I252 Old myocardial infarction: Secondary | ICD-10-CM | POA: Diagnosis not present

## 2018-03-08 DIAGNOSIS — R1013 Epigastric pain: Secondary | ICD-10-CM

## 2018-03-08 DIAGNOSIS — R1084 Generalized abdominal pain: Secondary | ICD-10-CM | POA: Diagnosis not present

## 2018-03-08 DIAGNOSIS — G3184 Mild cognitive impairment, so stated: Secondary | ICD-10-CM | POA: Diagnosis not present

## 2018-03-08 DIAGNOSIS — R279 Unspecified lack of coordination: Secondary | ICD-10-CM | POA: Diagnosis not present

## 2018-03-08 DIAGNOSIS — I10 Essential (primary) hypertension: Secondary | ICD-10-CM | POA: Diagnosis not present

## 2018-03-08 DIAGNOSIS — I201 Angina pectoris with documented spasm: Secondary | ICD-10-CM | POA: Diagnosis not present

## 2018-03-08 LAB — URINALYSIS, ROUTINE W REFLEX MICROSCOPIC
BILIRUBIN URINE: NEGATIVE
GLUCOSE, UA: NEGATIVE mg/dL
HGB URINE DIPSTICK: NEGATIVE
Ketones, ur: NEGATIVE mg/dL
Leukocytes, UA: NEGATIVE
NITRITE: NEGATIVE
PH: 7 (ref 5.0–8.0)
Protein, ur: NEGATIVE mg/dL
Specific Gravity, Urine: 1.015 (ref 1.005–1.030)

## 2018-03-08 NOTE — ED Notes (Signed)
Unable to contact nursing home facility x 3 attempts.

## 2018-03-08 NOTE — ED Notes (Signed)
[facility] was notified of pt's need of transportation back to [facility].

## 2018-03-08 NOTE — ED Provider Notes (Signed)
[place] DEPT Provider Note: Georgena Spurling, MD, FACEP  CSN: [id number] MRN: [id number] ARRIVAL: 03/08/18 at [place]: [place]  Abdominal Pain   HISTORY OF PRESENT ILLNESS  03/08/18 2:54 AM [name] is a 85 y.o. male with multiple medical problems including heart failure, atrial fibrillation and gastrostomy tube.  He is here from his living facility complaining of abdominal pain after taking Tylenol.  He states the pain is improved and is minimal at the present time.  He does have some epigastric tenderness but states that has been present since his gastrostomy tube placement.  He also had a headache earlier but states this is down to a 5 out of 10 which is manageable and he does not wish any additional medications.   Past Medical History:  Diagnosis Date  . Abnormal thyroid scan    Abnormal thyroid imaging studies from 11/09/2010, status post ultrasound guided fine needle aspiration of the dominant left inferior thyroid nodule on 12/15/2010. Cytology report showed rare follicular epithelial cells and hemosiderin laden macrophages.  . Adenomatous colon polyp   . AICD (automatic cardioverter/defibrillator) present    a. fx lead; a. s/p lead extraction 03/02/17  . Arthritis    "all over"  . Asthma   . Atrial fibrillation ([place])    on chronic Coumadin; stopped July 2013 due to subdural hematomas  . CHF (congestive heart failure) (HCC)    EF 35-40% s/p most recent ICD generator change-out with Medtronic dual-chamber ICD 05/20/11 with explantation of previous abdominally-implanted device  . Chronic systolic heart failure- EF 35-40% 12/30/2009    Ejection fraction of 35-40% with an ischemic cardiomyopathy.  . Coronary artery disease    s/p CABG 1983 and PCI/stent 2004.   . Diabetes mellitus    diet controlled  . Diverticulosis   . Dyslipidemia   . Enteritis   . Erythrocytosis   . GERD (gastroesophageal reflux disease)   . Hepatic steatosis   .  Hypertension   . Ischemic cardiomyopathy    WITH CHF  . Monocytosis 04/17/2013  . Myocardial infarction ([place]) 1983; ~ 1990  . Pleural effusion    right  . Pneumonia August 2013  . Portal hypertensive gastropathy ([place])   . Rectal ulcer   . Renal calculi   . Subdural hematoma [facility]) July 2013   Anticoagulation stopped.   . SunDown syndrome   . VT (ventricular tachycardia) ([name])     Past Surgical History:  Procedure Laterality Date  . CHOLECYSTECTOMY    . COLONOSCOPY  07/07/2011   Procedure: COLONOSCOPY;  Surgeon: Jerene Bears, MD;  Location: [facility];  Service: Gastroenterology;  Laterality: N/A;  . CORONARY ANGIOPLASTY WITH STENT PLACEMENT  2004   Tandem Cypher stents LAD  . CORONARY ARTERY BYPASS GRAFT  1983   SVG-mLAD  . ESOPHAGOGASTRODUODENOSCOPY  02/11/2011   Procedure: ESOPHAGOGASTRODUODENOSCOPY (EGD);  Surgeon: Beryle Beams, MD;  Location: [facility];  Service: Endoscopy;  Laterality: N/A;  . FLEXIBLE SIGMOIDOSCOPY N/A 02/08/2017   Procedure: FLEXIBLE SIGMOIDOSCOPY;  Surgeon: Irene Shipper, MD;  Location: [facility];  Service: Endoscopy;  Laterality: N/A;  . ICD GENERATOR CHANGEOUT N/A 04/08/2016   Procedure: ICD Generator Changeout;  Surgeon: Evans Lance, MD;  Location: [place] CV LAB;  Service: Cardiovascular;  Laterality: N/A;  . ICD LEAD REMOVAL N/A 03/02/2017   Procedure: ICD LEAD REMOVAL;  Surgeon: Evans Lance, MD;  Location: [place];  Service: Cardiovascular;  Laterality: N/A;  . IMPLANTABLE CARDIOVERTER  DEFIBRILLATOR (ICD) GENERATOR CHANGE N/A 05/20/2011   Procedure: ICD GENERATOR CHANGE;  Surgeon: Evans Lance, MD;  Medtronic secure dual-chamber ICD serial number [id number]   . IR GASTROSTOMY TUBE MOD SED  04/19/2017  . KNEE ARTHROSCOPY     right; "just went in and scraped it"  . LAPAROTOMY N/A 09/30/2017   Procedure: LAPAROTOMY, LYSIS OF ADHESION;  Surgeon: Rolm Bookbinder, MD;  Location: [place];  Service: General;  Laterality: N/A;  . LEAD INSERTION  N/A 03/02/2017   Procedure: LEAD INSERTION;  Surgeon: Evans Lance, MD;  Location: [place];  Service: Cardiovascular;  Laterality: N/A;  . MASS EXCISION Right 05/10/2013   Procedure: EXCISION MASS RIGHT THUMB;  Surgeon: Wynonia Sours, MD;  Location: [place];  Service: Orthopedics;  Laterality: Right;  . PROXIMAL INTERPHALANGEAL FUSION (PIP) Right 05/10/2013   Procedure: DEBRIDEMENT PROXIMAL INTERPHALANGEAL FUSION (PIP);  Surgeon: Wynonia Sours, MD;  Location: [place];  Service: Orthopedics;  Laterality: Right;  . TONSILLECTOMY          Family History  Problem Relation Age of Onset  . Tuberculosis Mother   . Tuberculosis Father   . Heart disease Brother   . Diabetes Sister   . Diabetes Brother   . Clotting disorder Brother     Social History   Tobacco Use  . Smoking status: Former Smoker    Packs/day: 2.00    Years: 30.00    Pack years: 60.00    Types: Cigarettes    Last attempt to quit: 06/23/1976    Years since quitting: 41.7  . Smokeless tobacco: Never Used  Substance Use Topics  . Alcohol use: No  . Drug use: No    Prior to Admission medications   Medication Sig Start Date End Date Taking? Authorizing Provider  acetaminophen (TYLENOL) 325 MG tablet Give 2 tablets via PEG - tube every 6 hours for stomach pain 10/11/17  Yes [provider]  ADVAIR DISKUS 250-50 MCG/DOSE AEPB Inhale 1 puff into the lungs 2 (two) times daily.  11/13/14  Yes [provider]  Artificial Saliva (BIOTENE MOISTURIZING MOUTH) SOLN Give 1 swab by mouth every 4 hours while awake 05/04/17  Yes [provider]  digoxin (LANOXIN) 0.05 MG/ML solution Place 2.5 mLs (0.125 mg total) into feeding tube daily. 04/26/17  Yes Eugenie Filler, MD  furosemide (LASIX) 10 MG/ML solution Place 20 mg into feeding tube daily. Give 2 ml (20mg ) via PEG - Tube daily 05/03/17  Yes [provider]  ipratropium (ATROVENT) 0.03 % nasal spray Place 1 spray into  both nostrils 3 (three) times daily before meals. 06/02/17  Yes [provider]  ipratropium-albuterol (DUONEB) 0.5-2.5 (3) MG/3ML SOLN Take 3 mLs by nebulization every 4 (four) hours as needed.  10/06/17  Yes [provider]  meloxicam (MOBIC) 7.5 MG tablet Take 7.5 mg by mouth daily. Via PEG-Tube 02/23/18  Yes [provider]  metoprolol tartrate (LOPRESSOR) 25 MG tablet Place 1 tablet (25 mg total) into feeding tube 2 (two) times daily. 10/06/17  Yes Simaan, Darci Current, PA-C  nitroGLYCERIN (NITROSTAT) 0.4 MG SL tablet Place 1 tablet (0.4 mg total) under the tongue every 5 (five) minutes as needed for chest pain. 04/26/17  Yes Eugenie Filler, MD  omeprazole (PRILOSEC) 40 MG capsule Take 40 mg by mouth daily. Give 1 capsule via Peg Tube daily   Yes [provider]  ondansetron (ZOFRAN) 4 MG tablet Place 4 mg into feeding  tube every 6 (six) hours as needed for nausea or vomiting. 05/10/17  Yes [provider]  polyethylene glycol (MIRALAX / GLYCOLAX) packet Place 17 g into feeding tube 2 (two) times daily.  05/12/17  Yes [provider]  potassium chloride 20 MEQ/15ML (10%) SOLN Place 20 mEq into feeding tube daily. 10/07/17  Yes [provider]  RESTASIS 0.05 % ophthalmic emulsion INSTILL 1 DROP INTO BOTH EYES TWICE A DAY Patient taking differently: Place 1 drop into both eyes 2 (two) times daily.  11/17/16  Yes [name], MD  tiotropium (SPIRIVA) 18 MCG inhalation capsule Place 18 mcg into inhaler and inhale daily. 05/03/17  Yes [provider]  NON FORMULARY Regular diet - Pureed Texture, Nectar thickened Fluids consistency.   Patient may have water (thin) and pleasure foods (puree texture and nectar thick liquids upon request    [provider]  Nutritional Supplements (FEEDING SUPPLEMENT, JEVITY 1.5 CAL,) LIQD Jevity 1.5 -  Give 237 ml every 4 hours per peg tube 06/27/17   [provider]  OXYGEN Inhale 2 L/min  into the lungs daily as needed. To maintain sats less than 90%    [provider]  Water For Irrigation, Sterile (FREE WATER) SOLN Place 120 mLs into feeding tube every 4 (four) hours. 120 ml H2o flushes via tube every 4 hours    [provider]    Allergies Tamsulosin; Celebrex [celecoxib]; Dronedarone; Esomeprazole magnesium; Digoxin; Hydrocodone-acetaminophen; and Protonix [pantoprazole sodium]   REVIEW OF SYSTEMS  Negative except as noted here or in the History of Present Illness.   PHYSICAL EXAMINATION  Initial Vital S igns Blood pressure (!) 144/72, pulse 71, temperature 97.8 F (36.6 C), temperature source Oral, resp. rate 18, SpO2 98 %.  Examination General: Well-developed, cachectic male in no acute distress; appearance consistent with age of record HENT: normocephalic; atraumatic Eyes: pupils equal, round and reactive to light; extraocular muscles intact Neck: supple Heart: Irregular rhythm Lungs: clear to auscultation bilaterally Abdomen: soft; nondistended; epigastric tenderness; left-sided gastrostomy tube; bowel sounds present Extremities: No deformity; full range of motion; pulses normal Neurologic: Awake, alert; motor function intact in all extremities and symmetric; no facial droop Skin: Warm and dry Psychiatric: Normal mood and affect   RESULTS  Summary of this visit's results, reviewed by myself:   EKG Interpretation  Date/Time:    Ventricular Rate:    PR Interval:    QRS Duration:   QT Interval:    QTC Calculation:   R Axis:     Text Interpretation:        Laboratory Studies: Results for orders placed or performed during the hospital encounter of 03/08/18 (from the past 24 hour(s))  Urinalysis, Routine w reflex microscopic     Status: None   Collection Time: 03/08/18  3:29 AM  Result Value Ref Range   Color, Urine YELLOW YELLOW   APPearance CLEAR CLEAR   Specific Gravity, Urine 1.015 1.005 - 1.030   pH 7.0 5.0 - 8.0    Glucose, UA NEGATIVE NEGATIVE mg/dL   Hgb urine dipstick NEGATIVE NEGATIVE   Bilirubin Urine NEGATIVE NEGATIVE   Ketones, ur NEGATIVE NEGATIVE mg/dL   Protein, ur NEGATIVE NEGATIVE mg/dL   Nitrite NEGATIVE NEGATIVE   Leukocytes, UA NEGATIVE NEGATIVE   Imaging Studies: Dg Abdomen 1 View  Result Date: 03/08/2018 CLINICAL DATA:  Abdominal pain at gastrostomy tube site. EXAM: ABDOMEN - 1 VIEW COMPARISON:  09/30/2017 FINDINGS: Gastrostomy tube with balloon demonstrated in the left upper  quadrant. Surgical clips in the right upper quadrant. Scattered gas and stool in the colon. No small or large bowel distention. Calcification in the left mid abdomen may represent a renal stone. Calcified phleboliths in the pelvis. Vascular calcifications. Degenerative changes in the spine and hips. IMPRESSION: Nonobstructive bowel gas pattern. Left mid abdomen calcification may represent a renal stone. Electronically Signed   By: Lucienne Capers M.D.   On: 03/08/2018 03:36    ED COURSE and MDM  Nursing notes and initial vitals signs, including pulse oximetry, reviewed.  Vitals:   03/08/18 0332  BP: (!) 144/72  Pulse: 71  Resp: 18  Temp: 97.8 F (36.6 C)  TempSrc: Oral  SpO2: 98%   The patient's living facility requested that we change his gastrostomy tube.  The patient's gastrostomy tube flushes and draws well.  It appears normal on radiograph.  I see no reason it should be changed at this time.  PROCEDURES    ED DIAGNOSES     ICD-10-CM   1. Epigastric pain R10.13   2. Attention to gastrostomy tube [facility]) Z43.1        Edgard Debord, Jenny Reichmann, MD 03/08/18 [phone]

## 2018-03-08 NOTE — ED Notes (Signed)
[facility] here to transport pt back to SNF [facility] at [place]).

## 2018-03-08 NOTE — ED Triage Notes (Signed)
Pt from [facility] sent to ED for abd pain after taking tylenol, Pt has LLQ abd PEG tube and c/o pain near site

## 2018-03-12 DIAGNOSIS — I2510 Atherosclerotic heart disease of native coronary artery without angina pectoris: Secondary | ICD-10-CM | POA: Diagnosis not present

## 2018-03-12 DIAGNOSIS — I482 Chronic atrial fibrillation, unspecified: Secondary | ICD-10-CM | POA: Diagnosis not present

## 2018-03-12 DIAGNOSIS — K219 Gastro-esophageal reflux disease without esophagitis: Secondary | ICD-10-CM | POA: Diagnosis not present

## 2018-03-12 DIAGNOSIS — R1013 Epigastric pain: Secondary | ICD-10-CM | POA: Diagnosis not present

## 2018-03-14 DIAGNOSIS — F4321 Adjustment disorder with depressed mood: Secondary | ICD-10-CM | POA: Diagnosis not present

## 2018-03-15 DIAGNOSIS — I482 Chronic atrial fibrillation, unspecified: Secondary | ICD-10-CM | POA: Diagnosis not present

## 2018-03-15 DIAGNOSIS — K219 Gastro-esophageal reflux disease without esophagitis: Secondary | ICD-10-CM | POA: Diagnosis not present

## 2018-03-15 DIAGNOSIS — R1013 Epigastric pain: Secondary | ICD-10-CM | POA: Diagnosis not present

## 2018-03-15 DIAGNOSIS — I2510 Atherosclerotic heart disease of native coronary artery without angina pectoris: Secondary | ICD-10-CM | POA: Diagnosis not present

## 2018-03-20 ENCOUNTER — Emergency Department (HOSPITAL_COMMUNITY)
Admission: EM | Admit: 2018-03-20 | Discharge: 2018-03-20 | Disposition: A | Discharge status: Home / Self Care | Payer: Medicare Other | Attending: Emergency Medicine | Admitting: Emergency Medicine

## 2018-03-20 ENCOUNTER — Encounter (HOSPITAL_COMMUNITY): Payer: Self-pay | Admitting: Emergency Medicine

## 2018-03-20 ENCOUNTER — Emergency Department (HOSPITAL_COMMUNITY): Payer: Medicare Other

## 2018-03-20 DIAGNOSIS — Z87891 Personal history of nicotine dependence: Secondary | ICD-10-CM | POA: Insufficient documentation

## 2018-03-20 DIAGNOSIS — R079 Chest pain, unspecified: Secondary | ICD-10-CM | POA: Diagnosis not present

## 2018-03-20 DIAGNOSIS — Z79899 Other long term (current) drug therapy: Secondary | ICD-10-CM | POA: Diagnosis not present

## 2018-03-20 DIAGNOSIS — F0390 Unspecified dementia without behavioral disturbance: Secondary | ICD-10-CM | POA: Insufficient documentation

## 2018-03-20 DIAGNOSIS — I959 Hypotension, unspecified: Secondary | ICD-10-CM | POA: Diagnosis not present

## 2018-03-20 DIAGNOSIS — I4891 Unspecified atrial fibrillation: Secondary | ICD-10-CM | POA: Diagnosis not present

## 2018-03-20 DIAGNOSIS — I110 Hypertensive heart disease with heart failure: Secondary | ICD-10-CM | POA: Insufficient documentation

## 2018-03-20 DIAGNOSIS — I5022 Chronic systolic (congestive) heart failure: Secondary | ICD-10-CM | POA: Insufficient documentation

## 2018-03-20 DIAGNOSIS — R410 Disorientation, unspecified: Secondary | ICD-10-CM | POA: Diagnosis not present

## 2018-03-20 DIAGNOSIS — R51 Headache: Secondary | ICD-10-CM | POA: Insufficient documentation

## 2018-03-20 DIAGNOSIS — R1084 Generalized abdominal pain: Secondary | ICD-10-CM | POA: Diagnosis not present

## 2018-03-20 DIAGNOSIS — E119 Type 2 diabetes mellitus without complications: Secondary | ICD-10-CM | POA: Diagnosis not present

## 2018-03-20 DIAGNOSIS — I2510 Atherosclerotic heart disease of native coronary artery without angina pectoris: Secondary | ICD-10-CM | POA: Diagnosis not present

## 2018-03-20 DIAGNOSIS — R0902 Hypoxemia: Secondary | ICD-10-CM | POA: Diagnosis not present

## 2018-03-20 DIAGNOSIS — R519 Headache, unspecified: Secondary | ICD-10-CM

## 2018-03-20 DIAGNOSIS — Z951 Presence of aortocoronary bypass graft: Secondary | ICD-10-CM | POA: Diagnosis not present

## 2018-03-20 DIAGNOSIS — R1010 Upper abdominal pain, unspecified: Secondary | ICD-10-CM | POA: Diagnosis not present

## 2018-03-20 DIAGNOSIS — J449 Chronic obstructive pulmonary disease, unspecified: Secondary | ICD-10-CM | POA: Insufficient documentation

## 2018-03-20 DIAGNOSIS — R1011 Right upper quadrant pain: Secondary | ICD-10-CM | POA: Diagnosis not present

## 2018-03-20 LAB — CBC WITH DIFFERENTIAL/PLATELET
Abs Immature Granulocytes: 0.05 10*3/uL (ref 0.00–0.07)
BASOS ABS: 0 10*3/uL (ref 0.0–0.1)
BASOS PCT: 0 %
EOS ABS: 0.1 10*3/uL (ref 0.0–0.5)
EOS PCT: 1 %
HCT: 36.3 % — ABNORMAL LOW (ref 39.0–52.0)
Hemoglobin: 11.5 g/dL — ABNORMAL LOW (ref 13.0–17.0)
IMMATURE GRANULOCYTES: 0 %
Lymphocytes Relative: 13 %
Lymphs Abs: 1.6 10*3/uL (ref 0.7–4.0)
MCH: 30 pg (ref 26.0–34.0)
MCHC: 31.7 g/dL (ref 30.0–36.0)
MCV: 94.8 fL (ref 80.0–100.0)
Monocytes Absolute: 4.3 10*3/uL — ABNORMAL HIGH (ref 0.1–1.0)
Monocytes Relative: 36 %
NEUTROS PCT: 50 %
NRBC: 0 % (ref 0.0–0.2)
Neutro Abs: 6 10*3/uL (ref 1.7–7.7)
Platelets: 166 10*3/uL (ref 150–400)
RBC: 3.83 MIL/uL — ABNORMAL LOW (ref 4.22–5.81)
RDW: 14.5 % (ref 11.5–15.5)
WBC: 12.1 10*3/uL — AB (ref 4.0–10.5)

## 2018-03-20 LAB — I-STAT TROPONIN, ED: TROPONIN I, POC: 0.03 ng/mL (ref 0.00–0.08)

## 2018-03-20 LAB — URINALYSIS, ROUTINE W REFLEX MICROSCOPIC
BILIRUBIN URINE: NEGATIVE
GLUCOSE, UA: NEGATIVE mg/dL
Hgb urine dipstick: NEGATIVE
KETONES UR: NEGATIVE mg/dL
LEUKOCYTE UA: NEGATIVE
NITRITE: NEGATIVE
PH: 8 (ref 5.0–8.0)
Protein, ur: NEGATIVE mg/dL
Specific Gravity, Urine: 1.004 — ABNORMAL LOW (ref 1.005–1.030)

## 2018-03-20 LAB — COMPREHENSIVE METABOLIC PANEL
ALK PHOS: 138 U/L — AB (ref 38–126)
ALT: 16 U/L (ref 0–44)
ANION GAP: 9 (ref 5–15)
AST: 23 U/L (ref 15–41)
Albumin: 4 g/dL (ref 3.5–5.0)
BILIRUBIN TOTAL: 1.7 mg/dL — AB (ref 0.3–1.2)
BUN: 37 mg/dL — ABNORMAL HIGH (ref 8–23)
CALCIUM: 9.2 mg/dL (ref 8.9–10.3)
CO2: 27 mmol/L (ref 22–32)
CREATININE: 0.83 mg/dL (ref 0.61–1.24)
Chloride: 96 mmol/L — ABNORMAL LOW (ref 98–111)
GFR calc Af Amer: >60 mL/min (ref >60)
GFR calc non Af Amer: >60 mL/min (ref >60)
Glucose, Bld: 119 mg/dL — ABNORMAL HIGH (ref 70–99)
Potassium: 4.6 mmol/L (ref 3.5–5.1)
SODIUM: 132 mmol/L — AB (ref 135–145)
TOTAL PROTEIN: 7.7 g/dL (ref 6.5–8.1)

## 2018-03-20 LAB — LIPASE, BLOOD: LIPASE: 44 U/L (ref 11–51)

## 2018-03-20 MED ORDER — SODIUM CHLORIDE (PF) 0.9 % IJ SOLN
INTRAMUSCULAR | Status: AC
  Filled 2018-03-20: qty 50

## 2018-03-20 MED ORDER — IOPAMIDOL (ISOVUE-300) INJECTION 61%
INTRAVENOUS | Status: AC
  Filled 2018-03-20: qty 100

## 2018-03-20 MED ORDER — ACETAMINOPHEN 325 MG PO TABS
650.0000 mg | ORAL_TABLET | Freq: Once | ORAL | Status: AC
  Administered 2018-03-20: 650 mg via ORAL
  Filled 2018-03-20: qty 2

## 2018-03-20 MED ORDER — IOPAMIDOL (ISOVUE-300) INJECTION 61%
100.0000 mL | Freq: Once | INTRAVENOUS | Status: AC | PRN
  Administered 2018-03-20: 100 mL via INTRAVENOUS

## 2018-03-20 NOTE — ED Notes (Signed)
Patient called out for a bowel movement. Patient refused bed pan. Patient taken to bathroom in wheelchair.

## 2018-03-20 NOTE — ED Triage Notes (Signed)
Patient arrived via stretcher with [facility] from [place].  -C/C HA for about 1-2 hours, RUQ abdominal pain that has been going on for months -Hx of dementia according to staff, patient alert and oriented with intermittent periods of confusion  -PEG tube  Vitals  -BP 120/56 -HR 80 (irregular, a-fib) -O2 95 % RA -CBG 207

## 2018-03-20 NOTE — ED Notes (Signed)
[facility] has been dispatched.  

## 2018-03-20 NOTE — ED Notes (Signed)
Patient unable to sign signature pad. Dementia.

## 2018-03-20 NOTE — ED Notes (Signed)
Assisted patient with urinal at bedside. No problems. Urine sent to lab.

## 2018-03-20 NOTE — ED Notes (Signed)
Patient endorses multiple complaints, now stating stomach pain, abdominal pain at surgical site and HA.

## 2018-03-20 NOTE — ED Notes (Addendum)
Patient transported to CT 

## 2018-03-20 NOTE — ED Provider Notes (Signed)
[facility] DEPT Provider Note   CSN: [id number] Arrival date & time: 03/20/18  0401  LEVEL 5 CAVEAT - DEMENTIA   History   Chief Complaint Chief Complaint  Patient presents with  . Headache  . Abdominal Pain    HPI [name] is a 85 y.o. male.     HPI   85 year old male presents with headache and abdominal pain.  Patient reports these have been ongoing since his surgery but seem to wax and wane.  Headache worsened last night.  He states both the headache and abdominal pain are a 9 out of 10.  Abdominal pain is epigastric.  Headache is occipital.  History is otherwise fairly limited due to dementia.  He states he was given something at the nursing home but is not sure what for pain.  Past Medical History:  Diagnosis Date  . Abnormal thyroid scan    Abnormal thyroid imaging studies from 11/09/2010, status post ultrasound guided fine needle aspiration of the dominant left inferior thyroid nodule on 12/15/2010. Cytology report showed rare follicular epithelial cells and hemosiderin laden macrophages.  . Adenomatous colon polyp   . AICD (automatic cardioverter/defibrillator) present    a. fx lead; a. s/p lead extraction 03/02/17  . Arthritis    "all over"  . Asthma   . Atrial fibrillation ([place])    on chronic Coumadin; stopped July 2013 due to subdural hematomas  . CHF (congestive heart failure) (HCC)    EF 35-40% s/p most recent ICD generator change-out with Medtronic dual-chamber ICD 05/20/11 with explantation of previous abdominally-implanted device  . Chronic systolic heart failure- EF 35-40% 12/30/2009    Ejection fraction of 35-40% with an ischemic cardiomyopathy.  . Coronary artery disease    s/p CABG 1983 and PCI/stent 2004.   . Diabetes mellitus    diet controlled  . Diverticulosis   . Dyslipidemia   . Enteritis   . Erythrocytosis   . GERD (gastroesophageal reflux disease)   . Hepatic steatosis   . Hypertension   . Ischemic  cardiomyopathy    WITH CHF  . Monocytosis 04/17/2013  . Myocardial infarction ([place]) 1983; ~ 1990  . Pleural effusion    right  . Pneumonia August 2013  . Portal hypertensive gastropathy ([place])   . Rectal ulcer   . Renal calculi   . Subdural hematoma [facility]) July 2013   Anticoagulation stopped.   . SunDown syndrome   . VT (ventricular tachycardia) [facility])     Patient Active Problem List   Diagnosis Date Noted  . Protein-calorie malnutrition, severe 10/04/2017  . Small bowel ischemia ([place]) 09/30/2017  . Chronic pulmonary aspiration 07/31/2017  . H. pylori infection 07/24/2017  . Oral thrush 06/03/2017  . Chronic allergic rhinitis 05/14/2017  . Polyneuropathy 05/14/2017  . COPD (chronic obstructive pulmonary disease) ([place]) 05/02/2017  . Hypertensive heart disease with heart failure ([place]) 04/27/2017  . Adult failure to thrive   . DNR (do not resuscitate)   . Palliative care by specialist   . Malnutrition of moderate degree 04/13/2017  . CKD (chronic kidney disease), stage III ([place]) 04/07/2017  . Thyroid nodule 04/01/2017  . Chronic pulmonary embolism ([place]) 03/27/2017  . Failure of implantable cardioverter-defibrillator (ICD) lead 03/02/2017  . Chronic constipation 07/13/2016  . ICD (implantable cardioverter-defibrillator) in place 04/08/2016  . Pharyngoesophageal dysphagia 12/31/2013  . Gastroesophageal reflux disease without esophagitis 12/31/2013  . Monocytosis 04/17/2013  . Pleural effusion, right 10/03/2011  . Anemia, iron deficiency 12/29/2010  . Coronary  artery disease involving native coronary artery of native heart with angina pectoris ([place]) 06/25/2010  . Chronic atrial fibrillation 12/30/2009  . Chronic systolic heart failure- EF 35-40% 12/30/2009  . Automatic implantable cardioverter-defibrillator in situ 12/30/2009    Past Surgical History:  Procedure Laterality Date  . CHOLECYSTECTOMY    . COLONOSCOPY  07/07/2011   Procedure: COLONOSCOPY;  Surgeon: Jerene Bears,  MD;  Location: [facility];  Service: Gastroenterology;  Laterality: N/A;  . CORONARY ANGIOPLASTY WITH STENT PLACEMENT  2004   Tandem Cypher stents LAD  . CORONARY ARTERY BYPASS GRAFT  1983   SVG-mLAD  . ESOPHAGOGASTRODUODENOSCOPY  02/11/2011   Procedure: ESOPHAGOGASTRODUODENOSCOPY (EGD);  Surgeon: Beryle Beams, MD;  Location: [facility];  Service: Endoscopy;  Laterality: N/A;  . FLEXIBLE SIGMOIDOSCOPY N/A 02/08/2017   Procedure: FLEXIBLE SIGMOIDOSCOPY;  Surgeon: Irene Shipper, MD;  Location: [facility];  Service: Endoscopy;  Laterality: N/A;  . ICD GENERATOR CHANGEOUT N/A 04/08/2016   Procedure: ICD Generator Changeout;  Surgeon: Evans Lance, MD;  Location: [facility] CV [facility];  Service: Cardiovascular;  Laterality: N/A;  . ICD LEAD REMOVAL N/A 03/02/2017   Procedure: ICD LEAD REMOVAL;  Surgeon: Evans Lance, MD;  Location: [facility];  Service: Cardiovascular;  Laterality: N/A;  . IMPLANTABLE CARDIOVERTER DEFIBRILLATOR (ICD) GENERATOR CHANGE N/A 05/20/2011   Procedure: ICD GENERATOR CHANGE;  Surgeon: Evans Lance, MD;  Medtronic secure dual-chamber ICD serial number [id number]   . IR GASTROSTOMY TUBE MOD SED  04/19/2017  . KNEE ARTHROSCOPY     right; "just went in and scraped it"  . LAPAROTOMY N/A 09/30/2017   Procedure: LAPAROTOMY, LYSIS OF ADHESION;  Surgeon: Rolm Bookbinder, MD;  Location: [facility];  Service: General;  Laterality: N/A;  . LEAD INSERTION N/A 03/02/2017   Procedure: LEAD INSERTION;  Surgeon: Evans Lance, MD;  Location: [facility];  Service: Cardiovascular;  Laterality: N/A;  . MASS EXCISION Right 05/10/2013   Procedure: EXCISION MASS RIGHT THUMB;  Surgeon: Wynonia Sours, MD;  Location: [facility];  Service: Orthopedics;  Laterality: Right;  . PROXIMAL INTERPHALANGEAL FUSION (PIP) Right 05/10/2013   Procedure: DEBRIDEMENT PROXIMAL INTERPHALANGEAL FUSION (PIP);  Surgeon: Wynonia Sours, MD;  Location: [facility];  Service: Orthopedics;  Laterality:  Right;  . TONSILLECTOMY              Home Medications    Prior to Admission medications   Medication Sig Start Date End Date Taking? Authorizing Provider  acetaminophen (TYLENOL) 325 MG tablet Give 2 tablets via PEG - tube every 6 hours for stomach pain 10/11/17  Yes [provider]  ADVAIR DISKUS 250-50 MCG/DOSE AEPB Inhale 1 puff into the lungs 2 (two) times daily.  11/13/14  Yes [provider]  Artificial Saliva (BIOTENE MOISTURIZING MOUTH) SOLN Give 1 swab by mouth every 4 hours while awake 05/04/17  Yes [provider]  digoxin (LANOXIN) 0.05 MG/ML solution Place 2.5 mLs (0.125 mg total) into feeding tube daily. 04/26/17  Yes Eugenie Filler, MD  furosemide (LASIX) 10 MG/ML solution Place 20 mg into feeding tube daily. Give 2 ml (20mg ) via PEG - Tube daily 05/03/17  Yes [provider]  ipratropium (ATROVENT) 0.03 % nasal spray Place 1 spray into both nostrils 3 (three) times daily before meals. 06/02/17  Yes [provider]  ipratropium-albuterol (DUONEB) 0.5-2.5 (3) MG/3ML SOLN Take 3 mLs by nebulization every 4 (four) hours as needed.  10/06/17  Yes [provider]  meloxicam (MOBIC) 7.5 MG tablet Take 7.5 mg by mouth daily. Via PEG-Tube 02/23/18  Yes [provider]  metoprolol tartrate (LOPRESSOR) 25 MG tablet Place 1 tablet (25 mg total) into feeding tube 2 (two) times daily. 10/06/17  Yes Simaan, Darci Current, PA-C  nitroGLYCERIN (NITROSTAT) 0.4 MG SL tablet Place 1 tablet (0.4 mg total) under the tongue every 5 (five) minutes as needed for chest pain. 04/26/17  Yes Eugenie Filler, MD  NON FORMULARY Regular diet - Pureed Texture, Nectar thickened Fluids consistency.   Patient may have water (thin) and pleasure foods (puree texture and nectar thick liquids upon request   Yes [provider]  Nutritional Supplements (FEEDING SUPPLEMENT, JEVITY 1.5 CAL,) LIQD Jevity 1.5 -  Give 237 ml every 4 hours per peg tube 06/27/17   Yes [provider]  omeprazole (PRILOSEC) 40 MG capsule Take 40 mg by mouth daily. Give 1 capsule via Peg Tube daily   Yes [provider]  ondansetron (ZOFRAN) 4 MG tablet Place 4 mg into feeding tube every 6 (six) hours as needed for nausea or vomiting. 05/10/17  Yes [provider]  polyethylene glycol (MIRALAX / GLYCOLAX) packet Place 17 g into feeding tube 2 (two) times daily.  05/12/17  Yes [provider]  potassium chloride 20 MEQ/15ML (10%) SOLN Place 20 mEq into feeding tube daily. 10/07/17  Yes [provider]  RESTASIS 0.05 % ophthalmic emulsion INSTILL 1 DROP INTO BOTH EYES TWICE A DAY Patient taking differently: Place 1 drop into both eyes 2 (two) times daily.  11/17/16  Yes [name], MD  tiotropium (SPIRIVA) 18 MCG inhalation capsule Place 18 mcg into inhaler and inhale daily. 05/03/17  Yes [provider]  OXYGEN Inhale 2 L/min into the lungs daily as needed. To maintain sats less than 90%    [provider]  Water For Irrigation, Sterile (FREE WATER) SOLN Place 120 mLs into feeding tube every 4 (four) hours. 120 ml H2o flushes via tube every 4 hours    [provider]    Family History Family History  Problem Relation Age of Onset  . Tuberculosis Mother   . Tuberculosis Father   . Heart disease Brother   . Diabetes Sister   . Diabetes Brother   . Clotting disorder Brother     Social History Social History   Tobacco Use  . Smoking status: Former Smoker    Packs/day: 2.00    Years: 30.00    Pack years: 60.00    Types: Cigarettes    Last attempt to quit: 06/23/1976    Years since quitting: 41.7  . Smokeless tobacco: Never Used  Substance Use Topics  . Alcohol use: No  . Drug use: No     Allergies   Tamsulosin; Celebrex [celecoxib]; Dronedarone; Esomeprazole magnesium; Digoxin; Hydrocodone-acetaminophen; and Protonix [pantoprazole sodium]   Review of Systems Review of Systems  Unable to  perform ROS: Dementia     Physical Exam Updated Vital Signs BP 123/65 (BP Location: Left Arm)   Pulse 71   Temp 98 F (36.7 C) (Oral)   Resp 19   Ht 6\' 1"  (1.854 m)   Wt 65.8 kg Comment: Estimated by pt  SpO2 97%   BMI 19.13 kg/m   Physical Exam Vitals signs and nursing note reviewed.  Constitutional:      General: He is not in acute distress.    Appearance: He is well-developed. He is not ill-appearing or diaphoretic.  HENT:  Head: Normocephalic and atraumatic.     Right Ear: External ear normal.     Left Ear: External ear normal.     Nose: Nose normal.  Eyes:     General:        Right eye: No discharge.        Left eye: No discharge.     Extraocular Movements: Extraocular movements intact.     Pupils: Pupils are equal, round, and reactive to light.  Neck:     Musculoskeletal: Neck supple.  Cardiovascular:     Rate and Rhythm: Normal rate and regular rhythm.     Heart sounds: Normal heart sounds.  Pulmonary:     Effort: Pulmonary effort is normal.     Breath sounds: Normal breath sounds.  Abdominal:     Palpations: Abdomen is soft.     Tenderness: There is abdominal tenderness in the right upper quadrant and epigastric area.     Comments: G-tube in place without overt signs of infection or displacement  Musculoskeletal:     Comments: Pitting edema to BLE  Skin:    General: Skin is warm and dry.  Neurological:     Mental Status: He is alert. He is disoriented.     Comments: CN 3-12 grossly intact. 5/5 strength in all 4 extremities. Grossly normal sensation. Normal finger to nose.   Psychiatric:        Mood and Affect: Mood is not anxious.      ED Treatments / Results  Labs (all labs ordered are listed, but only abnormal results are displayed) Labs Reviewed  COMPREHENSIVE METABOLIC PANEL - Abnormal; Notable for the following components:      Result Value   Sodium 132 (*)    Chloride 96 (*)    Glucose, Bld 119 (*)    BUN 37 (*)    Alkaline  Phosphatase 138 (*)    Total Bilirubin 1.7 (*)    All other components within normal limits  CBC WITH DIFFERENTIAL/PLATELET - Abnormal; Notable for the following components:   WBC 12.1 (*)    RBC 3.83 (*)    Hemoglobin 11.5 (*)    HCT 36.3 (*)    Monocytes Absolute 4.3 (*)    All other components within normal limits  URINALYSIS, ROUTINE W REFLEX MICROSCOPIC - Abnormal; Notable for the following components:   Color, Urine STRAW (*)    Specific Gravity, Urine 1.004 (*)    All other components within normal limits  LIPASE, BLOOD  I-STAT TROPONIN, ED    EKG None ED ECG REPORT   Date: 03/20/2018  Rate: 93  Rhythm: atrial fibrillation  QRS Axis: normal  Intervals: normal  ST/T Wave abnormalities: nonspecific T wave changes  Conduction Disutrbances:none  Narrative Interpretation: No significant change since 9/19  Old EKG Reviewed: unchanged  I have personally reviewed the EKG tracing and agree with the computerized printout as noted.  Radiology Ct Head Wo Contrast  Result Date: 03/20/2018 CLINICAL DATA:  Headache EXAM: CT HEAD WITHOUT CONTRAST TECHNIQUE: Contiguous axial images were obtained from the base of the skull through the vertex without intravenous contrast. COMPARISON:  04/01/2017 FINDINGS: Brain: No evidence of acute infarction, hemorrhage, hydrocephalus, extra-axial collection or mass lesion/mass effect. Periventricular white matter hypodensity. Vascular: No hyperdense vessel or unexpected calcification. Skull: Normal. Negative for fracture or focal lesion. Sinuses/Orbits: No acute finding. Other: None. IMPRESSION: No acute intracranial pathology. Small-vessel white matter disease unchanged from prior examination. Electronically Signed   By: Dorna Bloom.D.  On: 03/20/2018 09:31   Ct Abdomen Pelvis W Contrast  Result Date: 03/20/2018 CLINICAL DATA:  Headache, right upper quadrant abdominal pain EXAM: CT ABDOMEN AND PELVIS WITH CONTRAST TECHNIQUE: Multidetector CT  imaging of the abdomen and pelvis was performed using the standard protocol following bolus administration of intravenous contrast. CONTRAST:  167mL ISOVUE-300 IOPAMIDOL (ISOVUE-300) INJECTION 61% COMPARISON:  10/10/2017 FINDINGS: Lower chest: Small to moderate right, loculated appearing effusion with associated round atelectasis of the dependent right lung base. Trace left pleural effusion. Cardiomegaly and pericardial patch material with coronary artery calcifications and partially imaged pacer leads. Hepatobiliary: No focal liver abnormality is seen. Status post cholecystectomy. No biliary dilatation. Pancreas: Unremarkable. No pancreatic ductal dilatation or surrounding inflammatory changes. Spleen: Normal in size without focal abnormality. Adrenals/Urinary Tract: Adrenal glands are unremarkable. Nonobstructive left nephrolithiasis. No other evidence of urinary tract calculus or hydronephrosis. Bladder is unremarkable. Stomach/Bowel: Stomach is within normal limits. Percutaneous gastrostomy tube. Appendix appears normal. No evidence of bowel wall thickening, distention, or inflammatory changes. Occasional sigmoid diverticula without evidence of acute diverticulitis. Vascular/Lymphatic: Extensive calcific atherosclerosis. No enlarged abdominal or pelvic lymph nodes. Reproductive: No mass or other abnormality. Other: No abdominal wall hernia or abnormality. Trace ascites in the low pelvis. Musculoskeletal: No acute or significant osseous findings. IMPRESSION: 1. No acute CT findings of the abdomen or pelvis to explain right upper quadrant pain. Status post cholecystectomy. No biliary dilatation or other abnormality noted. 2.  Trace nonspecific ascites in the low pelvis. 3.  Chronic and incidental findings as detailed above. Electronically Signed   By: Eddie Candle M.D.   On: 03/20/2018 09:42    Procedures Procedures (including critical care time)  Medications Ordered in ED Medications  iopamidol  (ISOVUE-300) 61 % injection (has no administration in time range)  sodium chloride (PF) 0.9 % injection (has no administration in time range)  acetaminophen (TYLENOL) tablet 650 mg (650 mg Oral Given 03/20/18 0753)  iopamidol (ISOVUE-300) 61 % injection 100 mL (100 mLs Intravenous Contrast Given 03/20/18 0906)     Initial Impression / Assessment and Plan / ED Course  I have reviewed the triage vital signs and the nursing notes.  Pertinent labs & imaging results that were available during my care of the patient were reviewed by me and considered in my medical decision making (see chart for details).        Patient is resting comfortably.  Labs are overall near baseline though mild WBC elevation.  No focal findings on CT of head or abdominal/pelvis exam.  There is a recurrent right pleural effusion but he reports no dyspnea and has no hypoxia or increased work of breathing.  This looks similar to the one before.  Given no focal, emergent findings, this is likely more related to his chronic issues and I think he stable for discharge back to his facility.  Final Clinical Impressions(s) / ED Diagnoses   Final diagnoses:  Occipital headache  Upper abdominal pain    ED Discharge Orders    None       Sherwood Gambler, MD 03/20/18 1012

## 2018-03-20 NOTE — Discharge Instructions (Addendum)
If you develop continued, recurrent, or worsening headache, fever, neck stiffness, vomiting, blurry or double vision, weakness or numbness in your arms or legs, trouble speaking, abdominal pain, chest pain, or any other new/concerning symptoms then return to the ER for evaluation.

## 2018-03-21 DIAGNOSIS — F4321 Adjustment disorder with depressed mood: Secondary | ICD-10-CM | POA: Diagnosis not present

## 2018-03-27 DIAGNOSIS — F4321 Adjustment disorder with depressed mood: Secondary | ICD-10-CM | POA: Diagnosis not present

## 2018-03-28 DIAGNOSIS — I509 Heart failure, unspecified: Secondary | ICD-10-CM | POA: Diagnosis not present

## 2018-03-28 DIAGNOSIS — J90 Pleural effusion, not elsewhere classified: Secondary | ICD-10-CM | POA: Diagnosis not present

## 2018-03-30 DIAGNOSIS — I482 Chronic atrial fibrillation, unspecified: Secondary | ICD-10-CM | POA: Diagnosis not present

## 2018-03-30 DIAGNOSIS — R05 Cough: Secondary | ICD-10-CM | POA: Diagnosis not present

## 2018-03-30 DIAGNOSIS — J90 Pleural effusion, not elsewhere classified: Secondary | ICD-10-CM | POA: Diagnosis not present

## 2018-03-30 DIAGNOSIS — I5022 Chronic systolic (congestive) heart failure: Secondary | ICD-10-CM | POA: Diagnosis not present

## 2018-04-02 ENCOUNTER — Ambulatory Visit (INDEPENDENT_AMBULATORY_CARE_PROVIDER_SITE_OTHER): Payer: Medicare Other | Admitting: Nurse Practitioner

## 2018-04-02 DIAGNOSIS — I5022 Chronic systolic (congestive) heart failure: Secondary | ICD-10-CM | POA: Diagnosis not present

## 2018-04-02 LAB — CUP PACEART INCLINIC DEVICE CHECK
Date Time Interrogation Session: 20200302093338
Implantable Lead Implant Date: 20190131
Implantable Lead Location: 753860
Implantable Lead Model: 6935
MDC IDC PG IMPLANT DT: 20180309

## 2018-04-02 NOTE — Progress Notes (Signed)
ICD check in clinic. Normal device function. Thresholds and sensing consistent with previous device measurements. Impedance trends stable over time. No evidence of any ventricular arrhythmias. Histogram distribution appropriate for patient and level of activity. No changes made this session. Device programmed at appropriate safety margins. Device programmed to optimize intrinsic conduction.

## 2018-04-05 DIAGNOSIS — R1013 Epigastric pain: Secondary | ICD-10-CM | POA: Diagnosis not present

## 2018-04-05 DIAGNOSIS — F4321 Adjustment disorder with depressed mood: Secondary | ICD-10-CM | POA: Diagnosis not present

## 2018-04-05 DIAGNOSIS — I5023 Acute on chronic systolic (congestive) heart failure: Secondary | ICD-10-CM | POA: Diagnosis not present

## 2018-04-05 DIAGNOSIS — K219 Gastro-esophageal reflux disease without esophagitis: Secondary | ICD-10-CM | POA: Diagnosis not present

## 2018-04-05 DIAGNOSIS — I482 Chronic atrial fibrillation, unspecified: Secondary | ICD-10-CM | POA: Diagnosis not present

## 2018-04-06 DIAGNOSIS — J90 Pleural effusion, not elsewhere classified: Secondary | ICD-10-CM | POA: Diagnosis not present

## 2018-04-09 DIAGNOSIS — E785 Hyperlipidemia, unspecified: Secondary | ICD-10-CM | POA: Diagnosis not present

## 2018-04-09 DIAGNOSIS — D649 Anemia, unspecified: Secondary | ICD-10-CM | POA: Diagnosis not present

## 2018-04-09 DIAGNOSIS — E039 Hypothyroidism, unspecified: Secondary | ICD-10-CM | POA: Diagnosis not present

## 2018-04-09 DIAGNOSIS — I4891 Unspecified atrial fibrillation: Secondary | ICD-10-CM | POA: Diagnosis not present

## 2018-04-09 DIAGNOSIS — I5022 Chronic systolic (congestive) heart failure: Secondary | ICD-10-CM | POA: Diagnosis not present

## 2018-04-09 DIAGNOSIS — E119 Type 2 diabetes mellitus without complications: Secondary | ICD-10-CM | POA: Diagnosis not present

## 2018-04-10 DIAGNOSIS — F4321 Adjustment disorder with depressed mood: Secondary | ICD-10-CM | POA: Diagnosis not present

## 2018-04-12 DIAGNOSIS — I5023 Acute on chronic systolic (congestive) heart failure: Secondary | ICD-10-CM | POA: Diagnosis not present

## 2018-04-12 DIAGNOSIS — G3184 Mild cognitive impairment, so stated: Secondary | ICD-10-CM | POA: Diagnosis not present

## 2018-04-12 DIAGNOSIS — I482 Chronic atrial fibrillation, unspecified: Secondary | ICD-10-CM | POA: Diagnosis not present

## 2018-04-12 DIAGNOSIS — I2510 Atherosclerotic heart disease of native coronary artery without angina pectoris: Secondary | ICD-10-CM | POA: Diagnosis not present

## 2018-04-12 DIAGNOSIS — K219 Gastro-esophageal reflux disease without esophagitis: Secondary | ICD-10-CM | POA: Diagnosis not present

## 2018-04-15 ENCOUNTER — Other Ambulatory Visit: Payer: Self-pay

## 2018-04-15 ENCOUNTER — Inpatient Hospital Stay (HOSPITAL_COMMUNITY)
Admission: EM | Admit: 2018-04-15 | Discharge: 2018-04-19 | DRG: 393 | Disposition: A | Discharge status: Skilled Nursing Facility | Payer: Medicare Other | Attending: Internal Medicine | Admitting: Internal Medicine

## 2018-04-15 ENCOUNTER — Encounter (HOSPITAL_COMMUNITY): Payer: Self-pay

## 2018-04-15 ENCOUNTER — Emergency Department (HOSPITAL_COMMUNITY): Payer: Medicare Other

## 2018-04-15 DIAGNOSIS — Z95810 Presence of automatic (implantable) cardiac defibrillator: Secondary | ICD-10-CM | POA: Diagnosis not present

## 2018-04-15 DIAGNOSIS — N200 Calculus of kidney: Secondary | ICD-10-CM | POA: Diagnosis not present

## 2018-04-15 DIAGNOSIS — I482 Chronic atrial fibrillation, unspecified: Secondary | ICD-10-CM | POA: Diagnosis present

## 2018-04-15 DIAGNOSIS — K5730 Diverticulosis of large intestine without perforation or abscess without bleeding: Secondary | ICD-10-CM | POA: Diagnosis present

## 2018-04-15 DIAGNOSIS — R944 Abnormal results of kidney function studies: Secondary | ICD-10-CM | POA: Diagnosis present

## 2018-04-15 DIAGNOSIS — Z431 Encounter for attention to gastrostomy: Secondary | ICD-10-CM

## 2018-04-15 DIAGNOSIS — K760 Fatty (change of) liver, not elsewhere classified: Secondary | ICD-10-CM | POA: Diagnosis present

## 2018-04-15 DIAGNOSIS — Z9049 Acquired absence of other specified parts of digestive tract: Secondary | ICD-10-CM

## 2018-04-15 DIAGNOSIS — E43 Unspecified severe protein-calorie malnutrition: Secondary | ICD-10-CM | POA: Diagnosis present

## 2018-04-15 DIAGNOSIS — Z931 Gastrostomy status: Secondary | ICD-10-CM | POA: Diagnosis not present

## 2018-04-15 DIAGNOSIS — D62 Acute posthemorrhagic anemia: Secondary | ICD-10-CM | POA: Diagnosis present

## 2018-04-15 DIAGNOSIS — R52 Pain, unspecified: Secondary | ICD-10-CM | POA: Diagnosis not present

## 2018-04-15 DIAGNOSIS — K922 Gastrointestinal hemorrhage, unspecified: Secondary | ICD-10-CM | POA: Diagnosis not present

## 2018-04-15 DIAGNOSIS — Z86711 Personal history of pulmonary embolism: Secondary | ICD-10-CM

## 2018-04-15 DIAGNOSIS — K625 Hemorrhage of anus and rectum: Secondary | ICD-10-CM | POA: Diagnosis present

## 2018-04-15 DIAGNOSIS — Z951 Presence of aortocoronary bypass graft: Secondary | ICD-10-CM | POA: Diagnosis not present

## 2018-04-15 DIAGNOSIS — K766 Portal hypertension: Secondary | ICD-10-CM | POA: Diagnosis present

## 2018-04-15 DIAGNOSIS — M1990 Unspecified osteoarthritis, unspecified site: Secondary | ICD-10-CM | POA: Diagnosis present

## 2018-04-15 DIAGNOSIS — Z87891 Personal history of nicotine dependence: Secondary | ICD-10-CM

## 2018-04-15 DIAGNOSIS — I998 Other disorder of circulatory system: Secondary | ICD-10-CM | POA: Diagnosis present

## 2018-04-15 DIAGNOSIS — Z888 Allergy status to other drugs, medicaments and biological substances status: Secondary | ICD-10-CM

## 2018-04-15 DIAGNOSIS — Z79899 Other long term (current) drug therapy: Secondary | ICD-10-CM

## 2018-04-15 DIAGNOSIS — Z833 Family history of diabetes mellitus: Secondary | ICD-10-CM

## 2018-04-15 DIAGNOSIS — I2510 Atherosclerotic heart disease of native coronary artery without angina pectoris: Secondary | ICD-10-CM | POA: Diagnosis present

## 2018-04-15 DIAGNOSIS — K921 Melena: Secondary | ICD-10-CM | POA: Diagnosis not present

## 2018-04-15 DIAGNOSIS — Z955 Presence of coronary angioplasty implant and graft: Secondary | ICD-10-CM

## 2018-04-15 DIAGNOSIS — K633 Ulcer of intestine: Principal | ICD-10-CM

## 2018-04-15 DIAGNOSIS — R799 Abnormal finding of blood chemistry, unspecified: Secondary | ICD-10-CM

## 2018-04-15 DIAGNOSIS — Z791 Long term (current) use of non-steroidal anti-inflammatories (NSAID): Secondary | ICD-10-CM

## 2018-04-15 DIAGNOSIS — K219 Gastro-esophageal reflux disease without esophagitis: Secondary | ICD-10-CM | POA: Diagnosis present

## 2018-04-15 DIAGNOSIS — R1084 Generalized abdominal pain: Secondary | ICD-10-CM | POA: Diagnosis not present

## 2018-04-15 DIAGNOSIS — R4182 Altered mental status, unspecified: Secondary | ICD-10-CM | POA: Diagnosis not present

## 2018-04-15 DIAGNOSIS — R58 Hemorrhage, not elsewhere classified: Secondary | ICD-10-CM | POA: Diagnosis not present

## 2018-04-15 DIAGNOSIS — E119 Type 2 diabetes mellitus without complications: Secondary | ICD-10-CM | POA: Diagnosis present

## 2018-04-15 DIAGNOSIS — R279 Unspecified lack of coordination: Secondary | ICD-10-CM | POA: Diagnosis not present

## 2018-04-15 DIAGNOSIS — I5022 Chronic systolic (congestive) heart failure: Secondary | ICD-10-CM | POA: Diagnosis present

## 2018-04-15 DIAGNOSIS — Z743 Need for continuous supervision: Secondary | ICD-10-CM | POA: Diagnosis not present

## 2018-04-15 DIAGNOSIS — J449 Chronic obstructive pulmonary disease, unspecified: Secondary | ICD-10-CM | POA: Diagnosis present

## 2018-04-15 DIAGNOSIS — Z681 Body mass index (BMI) 19 or less, adult: Secondary | ICD-10-CM | POA: Diagnosis not present

## 2018-04-15 DIAGNOSIS — E785 Hyperlipidemia, unspecified: Secondary | ICD-10-CM | POA: Diagnosis present

## 2018-04-15 DIAGNOSIS — Z8249 Family history of ischemic heart disease and other diseases of the circulatory system: Secondary | ICD-10-CM

## 2018-04-15 DIAGNOSIS — I10 Essential (primary) hypertension: Secondary | ICD-10-CM | POA: Diagnosis not present

## 2018-04-15 DIAGNOSIS — I252 Old myocardial infarction: Secondary | ICD-10-CM

## 2018-04-15 DIAGNOSIS — I110 Hypertensive heart disease with heart failure: Secondary | ICD-10-CM | POA: Diagnosis present

## 2018-04-15 DIAGNOSIS — Z86010 Personal history of colonic polyps: Secondary | ICD-10-CM

## 2018-04-15 DIAGNOSIS — R1310 Dysphagia, unspecified: Secondary | ICD-10-CM | POA: Diagnosis present

## 2018-04-15 DIAGNOSIS — Z885 Allergy status to narcotic agent status: Secondary | ICD-10-CM

## 2018-04-15 DIAGNOSIS — I959 Hypotension, unspecified: Secondary | ICD-10-CM | POA: Diagnosis not present

## 2018-04-15 DIAGNOSIS — Z87442 Personal history of urinary calculi: Secondary | ICD-10-CM

## 2018-04-15 DIAGNOSIS — I255 Ischemic cardiomyopathy: Secondary | ICD-10-CM | POA: Diagnosis present

## 2018-04-15 DIAGNOSIS — K4090 Unilateral inguinal hernia, without obstruction or gangrene, not specified as recurrent: Secondary | ICD-10-CM | POA: Diagnosis not present

## 2018-04-15 DIAGNOSIS — R0902 Hypoxemia: Secondary | ICD-10-CM | POA: Diagnosis not present

## 2018-04-15 DIAGNOSIS — Z7951 Long term (current) use of inhaled steroids: Secondary | ICD-10-CM

## 2018-04-15 LAB — COMPREHENSIVE METABOLIC PANEL
ALK PHOS: 129 U/L — AB (ref 38–126)
ALT: 13 U/L (ref 0–44)
AST: 24 U/L (ref 15–41)
Albumin: 3.2 g/dL — ABNORMAL LOW (ref 3.5–5.0)
Anion gap: 11 (ref 5–15)
BUN: 41 mg/dL — AB (ref 8–23)
CALCIUM: 8.9 mg/dL (ref 8.9–10.3)
CO2: 30 mmol/L (ref 22–32)
Chloride: 94 mmol/L — ABNORMAL LOW (ref 98–111)
Creatinine, Ser: 0.93 mg/dL (ref 0.61–1.24)
GFR calc Af Amer: >60 mL/min (ref >60)
GFR calc non Af Amer: >60 mL/min (ref >60)
Glucose, Bld: 121 mg/dL — ABNORMAL HIGH (ref 70–99)
Potassium: 4.3 mmol/L (ref 3.5–5.1)
Sodium: 135 mmol/L (ref 135–145)
Total Bilirubin: 0.7 mg/dL (ref 0.3–1.2)
Total Protein: 7.1 g/dL (ref 6.5–8.1)

## 2018-04-15 LAB — URINALYSIS, ROUTINE W REFLEX MICROSCOPIC
Bacteria, UA: NONE SEEN
Bilirubin Urine: NEGATIVE
Glucose, UA: NEGATIVE mg/dL
Ketones, ur: 5 mg/dL — AB
Leukocytes,Ua: NEGATIVE
Nitrite: NEGATIVE
Protein, ur: NEGATIVE mg/dL
Specific Gravity, Urine: 1.017 (ref 1.005–1.030)
pH: 5 (ref 5.0–8.0)

## 2018-04-15 LAB — CBC WITH DIFFERENTIAL/PLATELET
Abs Immature Granulocytes: 0.04 10*3/uL (ref 0.00–0.07)
Basophils Absolute: 0 10*3/uL (ref 0.0–0.1)
Basophils Relative: 0 %
EOS ABS: 0.3 10*3/uL (ref 0.0–0.5)
Eosinophils Relative: 3 %
HCT: 33 % — ABNORMAL LOW (ref 39.0–52.0)
Hemoglobin: 10 g/dL — ABNORMAL LOW (ref 13.0–17.0)
Immature Granulocytes: 0 %
Lymphocytes Relative: 17 %
Lymphs Abs: 1.5 10*3/uL (ref 0.7–4.0)
MCH: 29.2 pg (ref 26.0–34.0)
MCHC: 30.3 g/dL (ref 30.0–36.0)
MCV: 96.5 fL (ref 80.0–100.0)
Monocytes Absolute: 2.9 10*3/uL — ABNORMAL HIGH (ref 0.1–1.0)
Monocytes Relative: 32 %
Neutro Abs: 4.2 10*3/uL (ref 1.7–7.7)
Neutrophils Relative %: 48 %
Platelets: 243 10*3/uL (ref 150–400)
RBC: 3.42 MIL/uL — ABNORMAL LOW (ref 4.22–5.81)
RDW: 16.1 % — ABNORMAL HIGH (ref 11.5–15.5)
WBC: 9 10*3/uL (ref 4.0–10.5)
nRBC: 0 % (ref 0.0–0.2)

## 2018-04-15 LAB — HEMOGLOBIN AND HEMATOCRIT, BLOOD
HCT: 30 % — ABNORMAL LOW (ref 39.0–52.0)
Hemoglobin: 9.1 g/dL — ABNORMAL LOW (ref 13.0–17.0)

## 2018-04-15 LAB — POC OCCULT BLOOD, ED: Fecal Occult Bld: POSITIVE — AB

## 2018-04-15 LAB — DIGOXIN LEVEL: DIGOXIN LVL: 1.8 ng/mL (ref 0.8–2.0)

## 2018-04-15 LAB — LIPASE, BLOOD: Lipase: 54 U/L — ABNORMAL HIGH (ref 11–51)

## 2018-04-15 MED ORDER — DIGOXIN 250 MCG PO TABS
0.2500 mg | ORAL_TABLET | Freq: Every day | ORAL | Status: DC
  Administered 2018-04-16 – 2018-04-19 (×4): 0.25 mg
  Filled 2018-04-15 (×4): qty 1

## 2018-04-15 MED ORDER — SODIUM CHLORIDE 0.9 % IV SOLN: INTRAVENOUS | Status: DC

## 2018-04-15 MED ORDER — IOPAMIDOL (ISOVUE-300) INJECTION 61%
100.0000 mL | Freq: Once | INTRAVENOUS | Status: AC | PRN
  Administered 2018-04-15: 100 mL via INTRAVENOUS

## 2018-04-15 MED ORDER — SODIUM CHLORIDE (PF) 0.9 % IJ SOLN
INTRAMUSCULAR | Status: AC
  Filled 2018-04-15: qty 50

## 2018-04-15 MED ORDER — TIOTROPIUM BROMIDE MONOHYDRATE 18 MCG IN CAPS: 18.0000 ug | ORAL_CAPSULE | Freq: Every day | RESPIRATORY_TRACT | Status: DC

## 2018-04-15 MED ORDER — PANTOPRAZOLE SODIUM 40 MG PO TBEC: 40.0000 mg | DELAYED_RELEASE_TABLET | Freq: Every day | ORAL | Status: DC

## 2018-04-15 MED ORDER — OMEPRAZOLE 20 MG PO CPDR
40.0000 mg | DELAYED_RELEASE_CAPSULE | Freq: Every day | ORAL | Status: DC
  Administered 2018-04-16 – 2018-04-19 (×2): 40 mg via ORAL
  Filled 2018-04-15 (×5): qty 2

## 2018-04-15 MED ORDER — IPRATROPIUM-ALBUTEROL 0.5-2.5 (3) MG/3ML IN SOLN: 3.0000 mL | RESPIRATORY_TRACT | Status: DC | PRN

## 2018-04-15 MED ORDER — FREE WATER
120.0000 mL | Status: DC
  Administered 2018-04-15 – 2018-04-19 (×21): 120 mL

## 2018-04-15 MED ORDER — CHLORHEXIDINE GLUCONATE 0.12 % MT SOLN
15.0000 mL | Freq: Two times a day (BID) | OROMUCOSAL | Status: DC
  Administered 2018-04-15 – 2018-04-19 (×7): 15 mL via OROMUCOSAL
  Filled 2018-04-15 (×3): qty 15

## 2018-04-15 MED ORDER — IOPAMIDOL (ISOVUE-300) INJECTION 61%
INTRAVENOUS | Status: AC
  Filled 2018-04-15: qty 100

## 2018-04-15 MED ORDER — MELATONIN 3 MG PO TABS
3.0000 mg | ORAL_TABLET | Freq: Every day | ORAL | Status: DC
  Administered 2018-04-15: 3 mg via ORAL
  Filled 2018-04-15: qty 1

## 2018-04-15 MED ORDER — MOMETASONE FURO-FORMOTEROL FUM 200-5 MCG/ACT IN AERO
2.0000 | INHALATION_SPRAY | Freq: Two times a day (BID) | RESPIRATORY_TRACT | Status: DC
  Administered 2018-04-15 – 2018-04-19 (×7): 2 via RESPIRATORY_TRACT
  Filled 2018-04-15: qty 8.8

## 2018-04-15 MED ORDER — UMECLIDINIUM BROMIDE 62.5 MCG/INH IN AEPB
1.0000 | INHALATION_SPRAY | Freq: Every day | RESPIRATORY_TRACT | Status: DC
  Administered 2018-04-16 – 2018-04-19 (×3): 1 via RESPIRATORY_TRACT
  Filled 2018-04-15: qty 7

## 2018-04-15 MED ORDER — IPRATROPIUM BROMIDE 0.03 % NA SOLN
1.0000 | Freq: Three times a day (TID) | NASAL | Status: DC
  Administered 2018-04-19 (×2): 1 via NASAL
  Filled 2018-04-15: qty 30

## 2018-04-15 MED ORDER — ORAL CARE MOUTH RINSE
15.0000 mL | Freq: Two times a day (BID) | OROMUCOSAL | Status: DC
  Administered 2018-04-19: 15 mL via OROMUCOSAL

## 2018-04-15 NOTE — ED Notes (Signed)
ED TO INPATIENT HANDOFF REPORT  ED Nurse Name and Phone #:Tana Coast Name/Age/Gender [name] 85 y.o. male Room/Bed: WA12/WA12  Code Status   Code Status: Full Code  Home/SNF/Other Home Patient oriented to: self, place, time and situation Is this baseline? Yes   Triage Complete: Triage complete  Chief Complaint blood in stool  Triage Note EMS reports from [facility], staff reports minor bright red blood in stool since yesterday. Pt c/o mild abdominal pain.  BP 109/48 HR 54 RR 16 Sp02 94 RA    Allergies Allergies  Allergen Reactions  . Tamsulosin Other (See Comments)    Dizziness, Made BP very low and weakness  . Celebrex [Celecoxib] Hives, Nausea And Vomiting and Other (See Comments)    Gi upset  . Dronedarone Nausea And Vomiting and Other (See Comments)    GI upset, abdominal pain  . Esomeprazole Magnesium Hives and Other (See Comments)    "don't really remember"  . Digoxin Diarrhea and Other (See Comments)    May have caused some diarrhea  . Hydrocodone-Acetaminophen Other (See Comments)    Bad headache  . Protonix [Pantoprazole Sodium] Nausea And Vomiting and Other (See Comments)    Tolerates Dexilant    Level of Care/Admitting Diagnosis ED Disposition    ED Disposition Condition Comment   Admit  Hospital Area: [place] [[id number]]  Level of Care: Telemetry [5]  Admit to tele based on following criteria: Monitor for Ischemic changes  Diagnosis: Rectal bleeding [[id number]]  Admitting Physician: Kayleen Memos [[id number]]  Attending Physician: Kayleen Memos [[id number]]  Estimated length of stay: past midnight tomorrow  Certification:: I certify this patient will need inpatient services for at least 2 midnights  PT Class (Do Not Modify): Inpatient [101]  PT Acc Code (Do Not Modify): Private [1]       B Medical/Surgery History Past Medical History:  Diagnosis Date  . Abnormal thyroid scan    Abnormal thyroid imaging studies  from 11/09/2010, status post ultrasound guided fine needle aspiration of the dominant left inferior thyroid nodule on 12/15/2010. Cytology report showed rare follicular epithelial cells and hemosiderin laden macrophages.  . Adenomatous colon polyp   . AICD (automatic cardioverter/defibrillator) present    a. fx lead; a. s/p lead extraction 03/02/17  . Arthritis    "all over"  . Asthma   . Atrial fibrillation ([place])    on chronic Coumadin; stopped July 2013 due to subdural hematomas  . CHF (congestive heart failure) (HCC)    EF 35-40% s/p most recent ICD generator change-out with Medtronic dual-chamber ICD 05/20/11 with explantation of previous abdominally-implanted device  . Chronic systolic heart failure- EF 35-40% 12/30/2009    Ejection fraction of 35-40% with an ischemic cardiomyopathy.  . Coronary artery disease    s/p CABG 1983 and PCI/stent 2004.   . Diabetes mellitus    diet controlled  . Diverticulosis   . Dyslipidemia   . Enteritis   . Erythrocytosis   . GERD (gastroesophageal reflux disease)   . Hepatic steatosis   . Hypertension   . Ischemic cardiomyopathy    WITH CHF  . Monocytosis 04/17/2013  . Myocardial infarction ([place]) 1983; ~ 1990  . Pleural effusion    right  . Pneumonia August 2013  . Portal hypertensive gastropathy ([place])   . Rectal ulcer   . Renal calculi   . Subdural hematoma [facility]) July 2013   Anticoagulation stopped.   . SunDown syndrome   . VT (ventricular  tachycardia) [facility])    Past Surgical History:  Procedure Laterality Date  . CHOLECYSTECTOMY    . COLONOSCOPY  07/07/2011   Procedure: COLONOSCOPY;  Surgeon: Jerene Bears, MD;  Location: [facility];  Service: Gastroenterology;  Laterality: N/A;  . CORONARY ANGIOPLASTY WITH STENT PLACEMENT  2004   Tandem Cypher stents LAD  . CORONARY ARTERY BYPASS GRAFT  1983   SVG-mLAD  . ESOPHAGOGASTRODUODENOSCOPY  02/11/2011   Procedure: ESOPHAGOGASTRODUODENOSCOPY (EGD);  Surgeon: Beryle Beams, MD;  Location: [facility];  Service: Endoscopy;  Laterality: N/A;  . FLEXIBLE SIGMOIDOSCOPY N/A 02/08/2017   Procedure: FLEXIBLE SIGMOIDOSCOPY;  Surgeon: Irene Shipper, MD;  Location: [facility];  Service: Endoscopy;  Laterality: N/A;  . ICD GENERATOR CHANGEOUT N/A 04/08/2016   Procedure: ICD Generator Changeout;  Surgeon: Evans Lance, MD;  Location: [place] CV [facility];  Service: Cardiovascular;  Laterality: N/A;  . ICD LEAD REMOVAL N/A 03/02/2017   Procedure: ICD LEAD REMOVAL;  Surgeon: Evans Lance, MD;  Location: [place];  Service: Cardiovascular;  Laterality: N/A;  . IMPLANTABLE CARDIOVERTER DEFIBRILLATOR (ICD) GENERATOR CHANGE N/A 05/20/2011   Procedure: ICD GENERATOR CHANGE;  Surgeon: Evans Lance, MD;  Medtronic secure dual-chamber ICD serial number [id number]   . IR GASTROSTOMY TUBE MOD SED  04/19/2017  . KNEE ARTHROSCOPY     right; "just went in and scraped it"  . LAPAROTOMY N/A 09/30/2017   Procedure: LAPAROTOMY, LYSIS OF ADHESION;  Surgeon: Rolm Bookbinder, MD;  Location: [place];  Service: General;  Laterality: N/A;  . LEAD INSERTION N/A 03/02/2017   Procedure: LEAD INSERTION;  Surgeon: Evans Lance, MD;  Location: [place];  Service: Cardiovascular;  Laterality: N/A;  . MASS EXCISION Right 05/10/2013   Procedure: EXCISION MASS RIGHT THUMB;  Surgeon: Wynonia Sours, MD;  Location: [place];  Service: Orthopedics;  Laterality: Right;  . PROXIMAL INTERPHALANGEAL FUSION (PIP) Right 05/10/2013   Procedure: DEBRIDEMENT PROXIMAL INTERPHALANGEAL FUSION (PIP);  Surgeon: Wynonia Sours, MD;  Location: [place];  Service: Orthopedics;  Laterality: Right;  . TONSILLECTOMY           A IV Location/Drains/Wounds Patient Lines/Drains/Airways Status   Active Line/Drains/Airways    Name:   Placement date:   Placement time:   Site:   Days:   Peripheral IV 04/15/18 Left Forearm   04/15/18    1104    Forearm   less than 1   Gastrostomy/Enterostomy Percutaneous endoscopic gastrostomy  (PEG) 20 Fr. LUQ   04/19/17    1104    LUQ   361   Incision (Closed) 09/30/17 Abdomen   09/30/17    0414     197   Wound / Incision (Open or Dehisced) Elbow Left skin tear   -    -    Elbow      Wound / Incision (Open or Dehisced) Other (Comment) Arm Right;Lower skin tear   -    -    Arm             Intake/Output Last 24 hours No intake or output data in the 24 hours ending 04/15/18 1627  Labs/Imaging Results for orders placed or performed during the hospital encounter of 04/15/18 (from the past 48 hour(s))  CBC with Differential/Platelet     Status: Abnormal   Collection Time: 04/15/18 10:46 AM  Result Value Ref Range   WBC 9.0 4.0 - 10.5 K/uL   RBC 3.42 (L) 4.22 - 5.81 MIL/uL  Hemoglobin 10.0 (L) 13.0 - 17.0 g/dL   HCT 33.0 (L) 39.0 - 52.0 %   MCV 96.5 80.0 - 100.0 fL   MCH 29.2 26.0 - 34.0 pg   MCHC 30.3 30.0 - 36.0 g/dL   RDW 16.1 (H) 11.5 - 15.5 %   Platelets 243 150 - 400 K/uL   nRBC 0.0 0.0 - 0.2 %   Neutrophils Relative % 48 %   Neutro Abs 4.2 1.7 - 7.7 K/uL   Lymphocytes Relative 17 %   Lymphs Abs 1.5 0.7 - 4.0 K/uL   Monocytes Relative 32 %   Monocytes Absolute 2.9 (H) 0.1 - 1.0 K/uL   Eosinophils Relative 3 %   Eosinophils Absolute 0.3 0.0 - 0.5 K/uL   Basophils Relative 0 %   Basophils Absolute 0.0 0.0 - 0.1 K/uL   Smear Review MORPHOLOGY UNREMARKABLE    Immature Granulocytes 0 %   Abs Immature Granulocytes 0.04 0.00 - 0.07 K/uL    Comment: Performed at [facility], [place] [address] [place], [place] [address]  Type and screen     Status: None   Collection Time: 04/15/18 10:46 AM  Result Value Ref Range   ABO/RH(D) O POS    Antibody Screen NEG    Sample Expiration      04/18/2018 Performed at [facility], [place] [address] [place], [place] [address]   Comprehensive metabolic panel     Status: Abnormal   Collection Time: 04/15/18 10:46 AM  Result Value Ref Range   Sodium 135 135 - 145 mmol/L   Potassium 4.3 3.5 -  5.1 mmol/L   Chloride 94 (L) 98 - 111 mmol/L   CO2 30 22 - 32 mmol/L   Glucose, Bld 121 (H) 70 - 99 mg/dL   BUN 41 (H) 8 - 23 mg/dL   Creatinine, Ser 0.93 0.61 - 1.24 mg/dL   Calcium 8.9 8.9 - 10.3 mg/dL   Total Protein 7.1 6.5 - 8.1 g/dL   Albumin 3.2 (L) 3.5 - 5.0 g/dL   AST 24 15 - 41 U/L   ALT 13 0 - 44 U/L   Alkaline Phosphatase 129 (H) 38 - 126 U/L   Total Bilirubin 0.7 0.3 - 1.2 mg/dL   GFR calc non Af Amer >60 >60 mL/min   GFR calc Af Amer >60 >60 mL/min   Anion gap 11 5 - 15    Comment: Performed at [facility], [place] [address] [place], [place] [address]  Lipase, blood     Status: Abnormal   Collection Time: 04/15/18 10:46 AM  Result Value Ref Range   Lipase 54 (H) 11 - 51 U/L    Comment: Performed at [facility], [place] [address], [place],  [address]  Urinalysis, Routine w reflex microscopic     Status: Abnormal   Collection Time: 04/15/18 10:47 AM  Result Value Ref Range   Color, Urine YELLOW YELLOW   APPearance CLEAR CLEAR   Specific Gravity, Urine 1.017 1.005 - 1.030   pH 5.0 5.0 - 8.0   Glucose, UA NEGATIVE NEGATIVE mg/dL   Hgb urine dipstick MODERATE (A) NEGATIVE   Bilirubin Urine NEGATIVE NEGATIVE   Ketones, ur 5 (A) NEGATIVE mg/dL   Protein, ur NEGATIVE NEGATIVE mg/dL   Nitrite NEGATIVE NEGATIVE   Leukocytes,Ua NEGATIVE NEGATIVE   RBC / HPF 11-20 0 - 5 RBC/hpf   WBC, UA 0-5 0 - 5 WBC/hpf   Bacteria, UA NONE SEEN NONE SEEN  Squamous Epithelial / LPF 0-5 0 - 5   Mucus PRESENT     Comment: Performed at [facility], [place] [address], [place], [place] [address]  POC occult blood, ED Provider will collect     Status: Abnormal   Collection Time: 04/15/18 11:08 AM  Result Value Ref Range   Fecal Occult Bld POSITIVE (A) NEGATIVE   Ct Abdomen Pelvis W Contrast  Result Date: 04/15/2018 CLINICAL DATA:  85 year old male with acute abdominal and pelvic pain with distension. EXAM: CT ABDOMEN AND PELVIS  WITH CONTRAST TECHNIQUE: Multidetector CT imaging of the abdomen and pelvis was performed using the standard protocol following bolus administration of intravenous contrast. CONTRAST:  129mL ISOVUE-300 IOPAMIDOL (ISOVUE-300) INJECTION 61% COMPARISON:  03/20/2018 and prior CTs FINDINGS: Lower chest: A moderate loculated RIGHT pleural effusion is unchanged. Cardiomegaly and pericardial patch again identified. New LEFT LOWER lobe tree-in-bud opacities may represent infection or aspiration. Hepatobiliary: The liver is unremarkable. The patient is status post cholecystectomy. No biliary dilatation. Pancreas: Unremarkable Spleen: Unchanged Adrenals/Urinary Tract: Bilateral renal atrophy and nonobstructing 5 mm LEFT LOWER pole renal calculus again noted. The adrenal glands and bladder are unremarkable. Stomach/Bowel: Percutaneous gastrostomy tube again noted. There is no evidence of bowel obstruction, definite bowel wall thickening or inflammatory changes. Vascular/Lymphatic: Aortic atherosclerosis. No enlarged abdominal or pelvic lymph nodes. Reproductive: Prostate is unremarkable. Other: A small RIGHT inguinal hernia containing a loop of small bowel again identified. No ascites, focal collection/abscess or pneumoperitoneum. Musculoskeletal: No acute or suspicious bony abnormalities. Degenerative changes in the lumbar spine and hips again noted. IMPRESSION: 1. New mild LEFT LOWER lobe tree-in-bud opacities likely representing infection or aspiration. 2. No acute abnormality or significant change within the abdomen or pelvis since 03/20/2018. 3. No significant change in moderate loculated RIGHT pleural effusion which may represent empyema. 4. Small RIGHT inguinal hernia containing small bowel. No small bowel obstruction or acute bowel abnormality. 5. Nonobstructing LEFT renal calculus 6. Cardiomegaly and Aortic Atherosclerosis (ICD10-I70.0). Electronically Signed   By: Margarette Canada M.D.   On: 04/15/2018 13:32    Pending  Labs Unresulted Labs (From admission, onward)    Start     Ordered   04/16/18 0500  CBC  Tomorrow morning,   R     04/15/18 1525   04/16/18 0272  Basic metabolic panel  Tomorrow morning,   R     04/15/18 1525   04/15/18 1700  Hemoglobin and hematocrit, blood  Once-Timed,   R     04/15/18 1525   04/15/18 1425  Digoxin level  ONCE - STAT,   STAT     04/15/18 1424   04/15/18 1047  Urine Culture  ONCE - STAT,   STAT     04/15/18 1046          Vitals/Pain Today's Vitals   04/15/18 1310 04/15/18 1400 04/15/18 1500 04/15/18 1600  BP: (!) 131/51 (!) 106/47 (!) 129/98 99/88  Pulse: (!) 56 (!) 39 (!) 158 65  Resp: 17 20 (!) 22 16  Temp:      TempSrc:      SpO2: 99% 94% 95% 93%  PainSc:        Isolation Precautions No active isolations  Medications Medications  0.9 %  sodium chloride infusion (has no administration in time range)  iopamidol (ISOVUE-300) 61 % injection (has no administration in time range)  sodium chloride (PF) 0.9 % injection (has no administration in time range)  mometasone-formoterol (DULERA) 200-5 MCG/ACT inhaler 2 puff (  has no administration in time range)  digoxin (LANOXIN) tablet 0.25 mg (has no administration in time range)  ipratropium (ATROVENT) 0.03 % nasal spray 1 spray (has no administration in time range)  ipratropium-albuterol (DUONEB) 0.5-2.5 (3) MG/3ML nebulizer solution 3 mL (has no administration in time range)  Melatonin TABS 3 mg (has no administration in time range)  pantoprazole (PROTONIX) EC tablet 40 mg (has no administration in time range)  free water 120 mL (has no administration in time range)  tiotropium (SPIRIVA) inhalation capsule ([facility] use ONLY) 18 mcg (18 mcg Inhalation Not Given 04/15/18 1532)  iopamidol (ISOVUE-300) 61 % injection 100 mL (100 mLs Intravenous Contrast Given 04/15/18 1248)    Mobility walks with person assist     Focused Assessments Rectal bleed   R Recommendations: See Admitting Provider Note  Report  given to:   Additional Notes: .

## 2018-04-15 NOTE — ED Provider Notes (Signed)
[facility] DEPT Provider Note   CSN: [id number] Arrival date & time: 04/15/18  1038    History   Chief Complaint Chief Complaint  Patient presents with  . Rectal Bleeding  . Abdominal Pain    HPI [name] is a 85 y.o. male.     85 year old male presents with bright red blood per rectum x1 day.  Denies any associated dull discomfort.  No fever or chills.  No emesis noted.  Take any blood thinners.  No prior history of same.  States he saw bright red blood mixed in his stool.  Denied passing any blood clots.  Called EMS and was transported here.     Past Medical History:  Diagnosis Date  . Abnormal thyroid scan    Abnormal thyroid imaging studies from 11/09/2010, status post ultrasound guided fine needle aspiration of the dominant left inferior thyroid nodule on 12/15/2010. Cytology report showed rare follicular epithelial cells and hemosiderin laden macrophages.  . Adenomatous colon polyp   . AICD (automatic cardioverter/defibrillator) present    a. fx lead; a. s/p lead extraction 03/02/17  . Arthritis    "all over"  . Asthma   . Atrial fibrillation ([place])    on chronic Coumadin; stopped July 2013 due to subdural hematomas  . CHF (congestive heart failure) (HCC)    EF 35-40% s/p most recent ICD generator change-out with Medtronic dual-chamber ICD 05/20/11 with explantation of previous abdominally-implanted device  . Chronic systolic heart failure- EF 35-40% 12/30/2009    Ejection fraction of 35-40% with an ischemic cardiomyopathy.  . Coronary artery disease    s/p CABG 1983 and PCI/stent 2004.   . Diabetes mellitus    diet controlled  . Diverticulosis   . Dyslipidemia   . Enteritis   . Erythrocytosis   . GERD (gastroesophageal reflux disease)   . Hepatic steatosis   . Hypertension   . Ischemic cardiomyopathy    WITH CHF  . Monocytosis 04/17/2013  . Myocardial infarction ([place]) 1983; ~ 1990  . Pleural effusion    right  .  Pneumonia August 2013  . Portal hypertensive gastropathy ([place])   . Rectal ulcer   . Renal calculi   . Subdural hematoma [facility]) July 2013   Anticoagulation stopped.   . SunDown syndrome   . VT (ventricular tachycardia) [facility])     Patient Active Problem List   Diagnosis Date Noted  . Protein-calorie malnutrition, severe 10/04/2017  . Small bowel ischemia ([place]) 09/30/2017  . Chronic pulmonary aspiration 07/31/2017  . H. pylori infection 07/24/2017  . Oral thrush 06/03/2017  . Chronic allergic rhinitis 05/14/2017  . Polyneuropathy 05/14/2017  . COPD (chronic obstructive pulmonary disease) ([place]) 05/02/2017  . Hypertensive heart disease with heart failure ([place]) 04/27/2017  . Adult failure to thrive   . DNR (do not resuscitate)   . Palliative care by specialist   . Malnutrition of moderate degree 04/13/2017  . CKD (chronic kidney disease), stage III ([place]) 04/07/2017  . Thyroid nodule 04/01/2017  . Chronic pulmonary embolism ([place]) 03/27/2017  . Failure of implantable cardioverter-defibrillator (ICD) lead 03/02/2017  . Chronic constipation 07/13/2016  . ICD (implantable cardioverter-defibrillator) in place 04/08/2016  . Pharyngoesophageal dysphagia 12/31/2013  . Gastroesophageal reflux disease without esophagitis 12/31/2013  . Monocytosis 04/17/2013  . Pleural effusion, right 10/03/2011  . Anemia, iron deficiency 12/29/2010  . Coronary artery disease involving native coronary artery of native heart with angina pectoris ([place]) 06/25/2010  . Chronic atrial fibrillation 12/30/2009  . Chronic  systolic heart failure- EF 35-40% 12/30/2009  . Automatic implantable cardioverter-defibrillator in situ 12/30/2009    Past Surgical History:  Procedure Laterality Date  . CHOLECYSTECTOMY    . COLONOSCOPY  07/07/2011   Procedure: COLONOSCOPY;  Surgeon: Jerene Bears, MD;  Location: [facility];  Service: Gastroenterology;  Laterality: N/A;  . CORONARY ANGIOPLASTY WITH STENT PLACEMENT  2004   Tandem  Cypher stents LAD  . CORONARY ARTERY BYPASS GRAFT  1983   SVG-mLAD  . ESOPHAGOGASTRODUODENOSCOPY  02/11/2011   Procedure: ESOPHAGOGASTRODUODENOSCOPY (EGD);  Surgeon: Beryle Beams, MD;  Location: [facility];  Service: Endoscopy;  Laterality: N/A;  . FLEXIBLE SIGMOIDOSCOPY N/A 02/08/2017   Procedure: FLEXIBLE SIGMOIDOSCOPY;  Surgeon: Irene Shipper, MD;  Location: [facility];  Service: Endoscopy;  Laterality: N/A;  . ICD GENERATOR CHANGEOUT N/A 04/08/2016   Procedure: ICD Generator Changeout;  Surgeon: Evans Lance, MD;  Location: [facility];  Service: Cardiovascular;  Laterality: N/A;  . ICD LEAD REMOVAL N/A 03/02/2017   Procedure: ICD LEAD REMOVAL;  Surgeon: Evans Lance, MD;  Location: [facility];  Service: Cardiovascular;  Laterality: N/A;  . IMPLANTABLE CARDIOVERTER DEFIBRILLATOR (ICD) GENERATOR CHANGE N/A 05/20/2011   Procedure: ICD GENERATOR CHANGE;  Surgeon: Evans Lance, MD;  Medtronic secure dual-chamber ICD serial number [id number]   . IR GASTROSTOMY TUBE MOD SED  04/19/2017  . KNEE ARTHROSCOPY     right; "just went in and scraped it"  . LAPAROTOMY N/A 09/30/2017   Procedure: LAPAROTOMY, LYSIS OF ADHESION;  Surgeon: Rolm Bookbinder, MD;  Location: [facility];  Service: General;  Laterality: N/A;  . LEAD INSERTION N/A 03/02/2017   Procedure: LEAD INSERTION;  Surgeon: Evans Lance, MD;  Location: [facility];  Service: Cardiovascular;  Laterality: N/A;  . MASS EXCISION Right 05/10/2013   Procedure: EXCISION MASS RIGHT THUMB;  Surgeon: Wynonia Sours, MD;  Location: [facility];  Service: Orthopedics;  Laterality: Right;  . PROXIMAL INTERPHALANGEAL FUSION (PIP) Right 05/10/2013   Procedure: DEBRIDEMENT PROXIMAL INTERPHALANGEAL FUSION (PIP);  Surgeon: Wynonia Sours, MD;  Location: [facility];  Service: Orthopedics;  Laterality: Right;  . TONSILLECTOMY              Home Medications    Prior to Admission medications   Medication Sig Start Date End Date  Taking? Authorizing Provider  acetaminophen (TYLENOL) 325 MG tablet Give 2 tablets via PEG - tube every 6 hours for stomach pain 10/11/17   [provider]  ADVAIR DISKUS 250-50 MCG/DOSE AEPB Inhale 1 puff into the lungs 2 (two) times daily.  11/13/14   [provider]  Artificial Saliva (BIOTENE MOISTURIZING MOUTH) SOLN Give 1 swab by mouth every 4 hours while awake 05/04/17   [provider]  digoxin (LANOXIN) 0.05 MG/ML solution Place 2.5 mLs (0.125 mg total) into feeding tube daily. 04/26/17   Eugenie Filler, MD  furosemide (LASIX) 10 MG/ML solution Place 20 mg into feeding tube daily. Give 2 ml (20mg ) via PEG - Tube daily 05/03/17   [provider]  ipratropium (ATROVENT) 0.03 % nasal spray Place 1 spray into both nostrils 3 (three) times daily before meals. 06/02/17   [provider]  ipratropium-albuterol (DUONEB) 0.5-2.5 (3) MG/3ML SOLN Take 3 mLs by nebulization every 4 (four) hours as needed.  10/06/17   [provider]  meloxicam (MOBIC) 7.5 MG tablet Take 7.5 mg by mouth daily. Via PEG-Tube 02/23/18   [provider]  metoprolol tartrate (  LOPRESSOR) 25 MG tablet Place 1 tablet (25 mg total) into feeding tube 2 (two) times daily. 10/06/17   Jill Alexanders, PA-C  nitroGLYCERIN (NITROSTAT) 0.4 MG SL tablet Place 1 tablet (0.4 mg total) under the tongue every 5 (five) minutes as needed for chest pain. 04/26/17   Eugenie Filler, MD  NON FORMULARY Regular diet - Pureed Texture, Nectar thickened Fluids consistency.   Patient may have water (thin) and pleasure foods (puree texture and nectar thick liquids upon request    [provider]  Nutritional Supplements (FEEDING SUPPLEMENT, JEVITY 1.5 CAL,) LIQD Jevity 1.5 -  Give 237 ml every 4 hours per peg tube 06/27/17   [provider]  omeprazole (PRILOSEC) 40 MG capsule Take 40 mg by mouth daily. Give 1 capsule via Peg Tube daily    [provider]  ondansetron  (ZOFRAN) 4 MG tablet Place 4 mg into feeding tube every 6 (six) hours as needed for nausea or vomiting. 05/10/17   [provider]  OXYGEN Inhale 2 L/min into the lungs daily as needed. To maintain sats less than 90%    [provider]  polyethylene glycol (MIRALAX / GLYCOLAX) packet Place 17 g into feeding tube 2 (two) times daily.  05/12/17   [provider]  potassium chloride 20 MEQ/15ML (10%) SOLN Place 20 mEq into feeding tube daily. 10/07/17   [provider]  RESTASIS 0.05 % ophthalmic emulsion INSTILL 1 DROP INTO BOTH EYES TWICE A DAY Patient taking differently: Place 1 drop into both eyes 2 (two) times daily.  11/17/16   [name], MD  tiotropium (SPIRIVA) 18 MCG inhalation capsule Place 18 mcg into inhaler and inhale daily. 05/03/17   [provider]  Water For Irrigation, Sterile (FREE WATER) SOLN Place 120 mLs into feeding tube every 4 (four) hours. 120 ml H2o flushes via tube every 4 hours    [provider]    Family History Family History  Problem Relation Age of Onset  . Tuberculosis Mother   . Tuberculosis Father   . Heart disease Brother   . Diabetes Sister   . Diabetes Brother   . Clotting disorder Brother     Social History Social History   Tobacco Use  . Smoking status: Former Smoker    Packs/day: 2.00    Years: 30.00    Pack years: 60.00    Types: Cigarettes    Last attempt to quit: 06/23/1976    Years since quitting: 41.8  . Smokeless tobacco: Never Used  Substance Use Topics  . Alcohol use: No  . Drug use: No     Allergies   Tamsulosin; Celebrex [celecoxib]; Dronedarone; Esomeprazole magnesium; Digoxin; Hydrocodone-acetaminophen; and Protonix [pantoprazole sodium]   Review of Systems Review of Systems  Unable to perform ROS: Dementia     Physical Exam Updated Vital Signs BP (!) 118/50   Pulse (!) 58   Temp 97.9 F (36.6 C) (Oral)   Resp 16   SpO2 97%   Physical Exam Vitals signs and  nursing note reviewed.  Constitutional:      General: He is not in acute distress.    Appearance: Normal appearance. He is well-developed. He is not toxic-appearing.  HENT:     Head: Normocephalic and atraumatic.  Eyes:     General: Lids are normal.     Conjunctiva/sclera: Conjunctivae normal.     Pupils: Pupils are equal, round, and reactive to light.  Neck:     Musculoskeletal:  Normal range of motion and neck supple.     Thyroid: No thyroid mass.     Trachea: No tracheal deviation.  Cardiovascular:     Rate and Rhythm: Normal rate and regular rhythm.     Heart sounds: Normal heart sounds. No murmur. No gallop.   Pulmonary:     Effort: Pulmonary effort is normal. No respiratory distress.     Breath sounds: Normal breath sounds. No stridor. No decreased breath sounds, wheezing, rhonchi or rales.  Abdominal:     General: Bowel sounds are normal. There is no distension.     Palpations: Abdomen is soft.     Tenderness: There is no abdominal tenderness. There is no guarding or rebound.  Genitourinary:    Rectum: No external hemorrhoid or internal hemorrhoid. Normal anal tone.  Musculoskeletal: Normal range of motion.        General: No tenderness.  Skin:    General: Skin is warm and dry.     Findings: No abrasion or rash.  Neurological:     Mental Status: He is alert and oriented to person, place, and time.     GCS: GCS eye subscore is 4. GCS verbal subscore is 5. GCS motor subscore is 6.     Cranial Nerves: No cranial nerve deficit.     Sensory: No sensory deficit.  Psychiatric:        Speech: Speech normal.        Behavior: Behavior normal.      ED Treatments / Results  Labs (all labs ordered are listed, but only abnormal results are displayed) Labs Reviewed  URINE CULTURE  CBC WITH DIFFERENTIAL/PLATELET  COMPREHENSIVE METABOLIC PANEL  LIPASE, BLOOD  URINALYSIS, ROUTINE W REFLEX MICROSCOPIC  POC OCCULT BLOOD, ED  TYPE AND SCREEN    EKG EKG Interpretation   Date/Time:  Sunday April 15 2018 11:13:18 EDT Ventricular Rate:  54 PR Interval:    QRS Duration: 89 QT Interval:  433 QTC Calculation: 411 R Axis:   12 Text Interpretation:  Junctional rhythm Low voltage, extremity leads Abnormal R-wave progression, late transition Nonspecific T abnormalities, lateral leads Confirmed by Lacretia Leigh ([id number]) on 04/15/2018 2:23:39 PM   Radiology No results found.  Procedures Procedures (including critical care time)  Medications Ordered in ED Medications  0.9 %  sodium chloride infusion (has no administration in time range)     Initial Impression / Assessment and Plan / ED Course  I have reviewed the triage vital signs and the nursing notes.  Pertinent labs & imaging results that were available during my care of the patient were reviewed by me and considered in my medical decision making (see chart for details).        Patient with rectal bleeding and has guaiac positive stools.  Hemoglobin is 1.5 g below his baseline.  He notes weakness.  Abdominal CT results noted.  He denies any cough or congestion.  He is he is not short of breath.  Does note weakness.  Heart rate noted and will check dig level.  Will admit patient to the hospital  Final Clinical Impressions(s) / ED Diagnoses   Final diagnoses:  None    ED Discharge Orders    None       Lacretia Leigh, MD 04/15/18 1425

## 2018-04-15 NOTE — H&P (Addendum)
History and Physical  [name] PPI:[id number] DOB: 03/29/1932 DOA: 04/15/2018  Referring physician: Dr Zenia Resides  PCP: [name], MD  Outpatient Specialists: Cardiology Patient coming from: [place] as reported by the patient  Chief Complaint:    HPI: [name] is a 85 y.o. male with medical history significant for chronic A. Fib post AICD, history of PE not on oral anticoagulation, dysphagia status post PEG tube placement, chronic systolic CHF, cardiomyopathy with LVEF 35 to 40%, subdural hematoma, chronic right pleural effusion who presented to [facility] ED with complaints of bright red blood per rectum x1 day.  Sudden onset yesterday evening.  Denies abdominal pain or nausea.  Denies dizziness, chest pain or dyspnea.  Denies constipation, straining, use of laxative or stool softener.  No other associated symptoms.  ED Course: Upon presentation to the ED, vital signs remarkable for tachycardia bradycardia, has AICD.  Lab studies remarkable for elevated lipase 54, positive FOBT, drop in hemoglobin from 11 to 10.  CT abdomen and pelvis with contrast unrevealing for any acute intra-abdominal findings however showed moderate right pleural effusion and possible left lower lobe infiltrates.  To note patient denies fever, chills, cough, dyspnea, or any respiratory symptoms.  Review of Systems: Review of systems as noted in the HPI. All other systems reviewed and are negative.   Past Medical History:  Diagnosis Date   Abnormal thyroid scan    Abnormal thyroid imaging studies from 11/09/2010, status post ultrasound guided fine needle aspiration of the dominant left inferior thyroid nodule on 12/15/2010. Cytology report showed rare follicular epithelial cells and hemosiderin laden macrophages.   Adenomatous colon polyp    AICD (automatic cardioverter/defibrillator) present    a. fx lead; a. s/p lead extraction 03/02/17   Arthritis    "all over"   Asthma    Atrial fibrillation  ([place])    on chronic Coumadin; stopped July 2013 due to subdural hematomas   CHF (congestive heart failure) (HCC)    EF 35-40% s/p most recent ICD generator change-out with Medtronic dual-chamber ICD 05/20/11 with explantation of previous abdominally-implanted device   Chronic systolic heart failure- EF 35-40% 12/30/2009    Ejection fraction of 35-40% with an ischemic cardiomyopathy.   Coronary artery disease    s/p CABG 1983 and PCI/stent 2004.    Diabetes mellitus    diet controlled   Diverticulosis    Dyslipidemia    Enteritis    Erythrocytosis    GERD (gastroesophageal reflux disease)    Hepatic steatosis    Hypertension    Ischemic cardiomyopathy    WITH CHF   Monocytosis 04/17/2013   Myocardial infarction ([place]) 1983; ~ 1990   Pleural effusion    right   Pneumonia August 2013   Portal hypertensive gastropathy [facility])    Rectal ulcer    Renal calculi    Subdural hematoma [facility]) July 2013   Anticoagulation stopped.    SunDown syndrome    VT (ventricular tachycardia) ([place])    Past Surgical History:  Procedure Laterality Date   CHOLECYSTECTOMY     COLONOSCOPY  07/07/2011   Procedure: COLONOSCOPY;  Surgeon: Jerene Bears, MD;  Location: [facility];  Service: Gastroenterology;  Laterality: N/A;   CORONARY ANGIOPLASTY WITH STENT PLACEMENT  2004   Tandem Cypher stents LAD   CORONARY ARTERY BYPASS GRAFT  1983   SVG-mLAD   ESOPHAGOGASTRODUODENOSCOPY  02/11/2011   Procedure: ESOPHAGOGASTRODUODENOSCOPY (EGD);  Surgeon: Beryle Beams, MD;  Location: [facility];  Service: Endoscopy;  Laterality: N/A;   FLEXIBLE SIGMOIDOSCOPY N/A 02/08/2017   Procedure: FLEXIBLE SIGMOIDOSCOPY;  Surgeon: Irene Shipper, MD;  Location: [facility];  Service: Endoscopy;  Laterality: N/A;   ICD GENERATOR CHANGEOUT N/A 04/08/2016   Procedure: ICD Generator Changeout;  Surgeon: Evans Lance, MD;  Location: [place] [facility];  Service: Cardiovascular;  Laterality: N/A;   ICD LEAD  REMOVAL N/A 03/02/2017   Procedure: ICD LEAD REMOVAL;  Surgeon: Evans Lance, MD;  Location: [place];  Service: Cardiovascular;  Laterality: N/A;   IMPLANTABLE CARDIOVERTER DEFIBRILLATOR (ICD) GENERATOR CHANGE N/A 05/20/2011   Procedure: ICD GENERATOR CHANGE;  Surgeon: Evans Lance, MD;  Medtronic secure dual-chamber ICD serial number [id number]    IR GASTROSTOMY TUBE MOD SED  04/19/2017   KNEE ARTHROSCOPY     right; "just went in and scraped it"   LAPAROTOMY N/A 09/30/2017   Procedure: LAPAROTOMY, LYSIS OF ADHESION;  Surgeon: Rolm Bookbinder, MD;  Location: [place];  Service: General;  Laterality: N/A;   LEAD INSERTION N/A 03/02/2017   Procedure: LEAD INSERTION;  Surgeon: Evans Lance, MD;  Location: [place];  Service: Cardiovascular;  Laterality: N/A;   MASS EXCISION Right 05/10/2013   Procedure: EXCISION MASS RIGHT THUMB;  Surgeon: Wynonia Sours, MD;  Location: [place];  Service: Orthopedics;  Laterality: Right;   PROXIMAL INTERPHALANGEAL FUSION (PIP) Right 05/10/2013   Procedure: DEBRIDEMENT PROXIMAL INTERPHALANGEAL FUSION (PIP);  Surgeon: Wynonia Sours, MD;  Location: [place];  Service: Orthopedics;  Laterality: Right;   TONSILLECTOMY          Social History:  reports that he quit smoking about 41 years ago. His smoking use included cigarettes. He has a 60.00 pack-year smoking history. He has never used smokeless tobacco. He reports that he does not drink alcohol or use drugs.   Allergies  Allergen Reactions   Tamsulosin Other (See Comments)    Dizziness, Made BP very low and weakness   Celebrex [Celecoxib] Hives, Nausea And Vomiting and Other (See Comments)    Gi upset   Dronedarone Nausea And Vomiting and Other (See Comments)    GI upset, abdominal pain   Esomeprazole Magnesium Hives and Other (See Comments)    "don't really remember"   Digoxin Diarrhea and Other (See Comments)    May have caused some diarrhea    Hydrocodone-Acetaminophen Other (See Comments)    Bad headache   Protonix [Pantoprazole Sodium] Nausea And Vomiting and Other (See Comments)    Tolerates Dexilant    Family History  Problem Relation Age of Onset   Tuberculosis Mother    Tuberculosis Father    Heart disease Brother    Diabetes Sister    Diabetes Brother    Clotting disorder Brother      Prior to Admission medications   Medication Sig Start Date End Date Taking? Authorizing Provider  acetaminophen (TYLENOL) 325 MG tablet Give 2 tablets via PEG - tube every 6 hours for stomach pain 10/11/17  Yes [provider]  ADVAIR DISKUS 250-50 MCG/DOSE AEPB Inhale 1 puff into the lungs every 12 (twelve) hours.  11/13/14  Yes [provider]  Artificial Saliva (BIOTENE MOISTURIZING MOUTH) SOLN See admin instructions. Give 1 swab by mouth every 4 hours 05/04/17  Yes [provider]  digoxin (LANOXIN) 0.25 MG tablet Place 0.25 mg into feeding tube daily. Hold for heart rate less than 60. 04/10/18  Yes [provider]  furosemide (LASIX) 40 MG tablet Place  40 mg into feeding tube 2 (two) times daily.   Yes [provider]  ipratropium (ATROVENT) 0.03 % nasal spray Place 1 spray into both nostrils 3 (three) times daily before meals. 06/02/17  Yes [provider]  ipratropium-albuterol (DUONEB) 0.5-2.5 (3) MG/3ML SOLN Take 3 mLs by nebulization every 4 (four) hours as needed (For congestion.).  10/06/17  Yes [provider]  Melatonin 3 MG TABS Take 3 mg by mouth at bedtime.   Yes [provider]  meloxicam (MOBIC) 7.5 MG tablet Take 7.5 mg by mouth daily. Via PEG-Tube 02/23/18  Yes [provider]  metoprolol tartrate (LOPRESSOR) 25 MG tablet Place 1 tablet (25 mg total) into feeding tube 2 (two) times daily. 10/06/17  Yes Simaan, Darci Current, PA-C  nitroGLYCERIN (NITROSTAT) 0.4 MG SL tablet Place 1 tablet (0.4 mg total) under the tongue every 5 (five) minutes as  needed for chest pain. 04/26/17  Yes Eugenie Filler, MD  NON FORMULARY Regular diet - Pureed Texture, Nectar thickened Fluids consistency.   Patient may have water (thin) and pleasure foods (puree texture and nectar thick liquids upon request   Yes [provider]  Nutritional Supplements (FEEDING SUPPLEMENT, JEVITY 1.5 CAL,) LIQD Jevity 1.5 -  Give 237 ml every 4 hours per peg tube 06/27/17  Yes [provider]  omeprazole (PRILOSEC) 40 MG capsule Take 40 mg by mouth at bedtime. Via Peg Tube.   Yes [provider]  ondansetron (ZOFRAN) 4 MG tablet Place 4 mg into feeding tube every 6 (six) hours as needed for nausea or vomiting. 05/10/17  Yes [provider]  OXYGEN Inhale 2 L/min into the lungs daily as needed. To maintain sats less than 90%   Yes [provider]  polyethylene glycol (MIRALAX / GLYCOLAX) packet Place 17 g into feeding tube 2 (two) times daily.  05/12/17  Yes [provider]  potassium chloride 20 MEQ/15ML (10%) SOLN Place 20 mEq into feeding tube daily. 10/07/17  Yes [provider]  RESTASIS 0.05 % ophthalmic emulsion INSTILL 1 DROP INTO BOTH EYES TWICE A DAY Patient taking differently: Place 1 drop into both eyes 2 (two) times daily.  11/17/16  Yes [name], MD  tiotropium (SPIRIVA) 18 MCG inhalation capsule Place 18 mcg into inhaler and inhale daily. 05/03/17  Yes [provider]  Water For Irrigation, Sterile (FREE WATER) SOLN Place 120 mLs into feeding tube every 4 (four) hours. 120 ml H2o flushes via tube every 4 hours before and after meds, before initiating feeding or when there is an interruption of feeding to maintain tube patency.   Yes [provider]  furosemide (LASIX) 20 MG tablet Take 20 mg by mouth 2 (two) times daily. 04/21/18   [provider]    Physical Exam: BP (!) 106/47    Pulse (!) 39    Temp 97.9 F (36.6 C) (Oral)    Resp 20    SpO2 94%    General: 85 y.o.  year-old male well developed well nourished in no acute distress.  Alert and oriented x3.  Cardiovascular: Irregular rate and rhythm with no rubs or gallops.  No thyromegaly or JVD noted.  3+ pitting edema in lower extremities bilaterally. 2/4 pulses in all 4 extremities.  Respiratory: Rales noted at bases bilaterally with no wheezes. Good inspiratory effort.  Abdomen: Soft nontender nondistended with normal bowel sounds x4 quadrants.  Muskuloskeletal: No cyanosis or clubbing.  3+ pitting edema in lower extremities bilaterally.  Neuro: CN II-XII intact, strength, sensation, reflexes  Skin: No ulcerative lesions noted or rashes.  Hyperpigmentation noted in lower extremities bilaterally.  Psychiatry: Judgement and insight appear normal. Mood is appropriate for condition and setting          Labs on Admission:  Basic Metabolic Panel: Recent Labs  Lab 04/15/18 1046  NA 135  K 4.3  CL 94*  CO2 30  GLUCOSE 121*  BUN 41*  CREATININE 0.93  CALCIUM 8.9   Liver Function Tests: Recent Labs  Lab 04/15/18 1046  AST 24  ALT 13  ALKPHOS 129*  BILITOT 0.7  PROT 7.1  ALBUMIN 3.2*   Recent Labs  Lab 04/15/18 1046  LIPASE 54*   No results for input(s): AMMONIA in the last 168 hours. CBC: Recent Labs  Lab 04/15/18 1046  WBC 9.0  NEUTROABS 4.2  HGB 10.0*  HCT 33.0*  MCV 96.5  PLT 243   Cardiac Enzymes: No results for input(s): CKTOTAL, CKMB, CKMBINDEX, TROPONINI in the last 168 hours.  BNP (last 3 results) No results for input(s): BNP in the last 8760 hours.  ProBNP (last 3 results) No results for input(s): PROBNP in the last 8760 hours.  CBG: No results for input(s): GLUCAP in the last 168 hours.  Radiological Exams on Admission: Ct Abdomen Pelvis W Contrast  Result Date: 04/15/2018 CLINICAL DATA:  85 year old male with acute abdominal and pelvic pain with distension. EXAM: CT ABDOMEN AND PELVIS WITH CONTRAST TECHNIQUE: Multidetector CT imaging of the abdomen  and pelvis was performed using the standard protocol following bolus administration of intravenous contrast. CONTRAST:  17mL ISOVUE-300 IOPAMIDOL (ISOVUE-300) INJECTION 61% COMPARISON:  03/20/2018 and prior CTs FINDINGS: Lower chest: A moderate loculated RIGHT pleural effusion is unchanged. Cardiomegaly and pericardial patch again identified. New LEFT LOWER lobe tree-in-bud opacities may represent infection or aspiration. Hepatobiliary: The liver is unremarkable. The patient is status post cholecystectomy. No biliary dilatation. Pancreas: Unremarkable Spleen: Unchanged Adrenals/Urinary Tract: Bilateral renal atrophy and nonobstructing 5 mm LEFT LOWER pole renal calculus again noted. The adrenal glands and bladder are unremarkable. Stomach/Bowel: Percutaneous gastrostomy tube again noted. There is no evidence of bowel obstruction, definite bowel wall thickening or inflammatory changes. Vascular/Lymphatic: Aortic atherosclerosis. No enlarged abdominal or pelvic lymph nodes. Reproductive: Prostate is unremarkable. Other: A small RIGHT inguinal hernia containing a loop of small bowel again identified. No ascites, focal collection/abscess or pneumoperitoneum. Musculoskeletal: No acute or suspicious bony abnormalities. Degenerative changes in the lumbar spine and hips again noted. IMPRESSION: 1. New mild LEFT LOWER lobe tree-in-bud opacities likely representing infection or aspiration. 2. No acute abnormality or significant change within the abdomen or pelvis since 03/20/2018. 3. No significant change in moderate loculated RIGHT pleural effusion which may represent empyema. 4. Small RIGHT inguinal hernia containing small bowel. No small bowel obstruction or acute bowel abnormality. 5. Nonobstructing LEFT renal calculus 6. Cardiomegaly and Aortic Atherosclerosis (ICD10-I70.0). Electronically Signed   By: Margarette Canada M.D.   On: 04/15/2018 13:32    EKG: I independently viewed the EKG done and my findings are as followed:  Junctional rhythm with rate of 54 with no specific ST-T changes.  Assessment/Plan Present on Admission:  Rectal bleeding  Active Problems:   Rectal bleeding  Acute rectal bleeding Reports sudden onset of rectal bleed last night Reports persistent rectal bleed x4 Denies prior history of GI bleed Denies prior colonoscopy Positive FOBT in the ED Hemoglobin dropped from 11 to 10 Continue close monitoring Transfuse if hemoglobin less than 8  Consult GI in the morning CT abdomen and pelvis with contrast unrevealing for any acute intra-abdominal findings  Acute blood loss anemia suspect secondary to lower GI bleed Management as stated above Maintain map greater than 65 Maintain hemoglobin greater than 8 due to underlying cardiac disease  Chronic right pleural effusion Asymptomatic with no hypoxia Denies any respiratory symptoms Independent review CT abdomen pelvis which also revealed moderate right pleural effusion and possible infiltrates versus atelectasis in left lower lobe Afebrile with no leukocytosis Continue to monitor  Chronic A. fib post AICD/history of pulmonary embolism Not on anticoagulation Rate controlled on beta-blocker Hold off any medications that can affect his blood pressure  Hypotension, suspect related to GI bleed Hold off medications that can affect his blood pressure Maintain map greater than 65  Dysphagia status post PEG tube placement Resume tube feeding Dietary consult Maintain head of bed greater than 30 degree when feeding N.p.o. after midnight  Elevated lipase Asymptomatic Lipase 54 LFTs unremarkable  Moderate protein calorie malnutrition Albumin 3.2 Suspect low BMI Resume PEG tube feeding Dietary consult  Chronic systolic CHF Does not appear to be in exacerbation Hold off Lasix for now Continue strict I's and O's  History of subdural hematoma Not on anticoagulation or antiplatelets No acute issues  Risks: High risk for  decompensation due to acute rectal bleeding without anticoagulation or antiplatelets in the setting of multiple comorbidities and advanced age.  Patient will require least 2 midnights for further evaluation and treatment of present condition.  DVT prophylaxis: SCDs  Code Status: Full code as confirmed by patient himself  Family Communication: None at bedside  Disposition Plan: Admit to telemetry unit  Consults called: None.  Please consult GI in the morning.  Admission status: Inpatient status    Kayleen Memos MD [facility] Pager [phone]  If 7PM-7AM, please contact night-coverage [email or web address] Password [email or web address]  04/15/2018, 3:00 PM

## 2018-04-15 NOTE — ED Notes (Signed)
Bed: IW58 Expected date:  Expected time:  Means of arrival:  Comments: 85 yo rectal bleeding

## 2018-04-15 NOTE — ED Notes (Addendum)
Report given to Mandy.

## 2018-04-15 NOTE — ED Triage Notes (Signed)
EMS reports from [place], staff reports minor bright red blood in stool since yesterday. Pt c/o mild abdominal pain.  BP 109/48 HR 54 RR 16 Sp02 94 RA

## 2018-04-15 NOTE — ED Notes (Signed)
Patient had an bowel movement. BM was loose, watery and red

## 2018-04-16 ENCOUNTER — Other Ambulatory Visit: Payer: Self-pay

## 2018-04-16 DIAGNOSIS — D62 Acute posthemorrhagic anemia: Secondary | ICD-10-CM

## 2018-04-16 DIAGNOSIS — R799 Abnormal finding of blood chemistry, unspecified: Secondary | ICD-10-CM

## 2018-04-16 LAB — GLUCOSE, CAPILLARY
Glucose-Capillary: 130 mg/dL — ABNORMAL HIGH (ref 70–99)
Glucose-Capillary: 138 mg/dL — ABNORMAL HIGH (ref 70–99)
Glucose-Capillary: 166 mg/dL — ABNORMAL HIGH (ref 70–99)

## 2018-04-16 LAB — BASIC METABOLIC PANEL
Anion gap: 11 (ref 5–15)
BUN: 36 mg/dL — AB (ref 8–23)
CO2: 27 mmol/L (ref 22–32)
Calcium: 8.4 mg/dL — ABNORMAL LOW (ref 8.9–10.3)
Chloride: 98 mmol/L (ref 98–111)
Creatinine, Ser: 0.8 mg/dL (ref 0.61–1.24)
GFR calc Af Amer: >60 mL/min (ref >60)
GFR calc non Af Amer: >60 mL/min (ref >60)
Glucose, Bld: 103 mg/dL — ABNORMAL HIGH (ref 70–99)
Potassium: 4.1 mmol/L (ref 3.5–5.1)
SODIUM: 136 mmol/L (ref 135–145)

## 2018-04-16 LAB — CBC
HCT: 29.3 % — ABNORMAL LOW (ref 39.0–52.0)
Hemoglobin: 8.5 g/dL — ABNORMAL LOW (ref 13.0–17.0)
MCH: 28.5 pg (ref 26.0–34.0)
MCHC: 29 g/dL — ABNORMAL LOW (ref 30.0–36.0)
MCV: 98.3 fL (ref 80.0–100.0)
Platelets: 214 10*3/uL (ref 150–400)
RBC: 2.98 MIL/uL — ABNORMAL LOW (ref 4.22–5.81)
RDW: 16.4 % — ABNORMAL HIGH (ref 11.5–15.5)
WBC: 7.1 10*3/uL (ref 4.0–10.5)
nRBC: 0 % (ref 0.0–0.2)

## 2018-04-16 LAB — URINE CULTURE: Culture: <10000 — AB

## 2018-04-16 LAB — MRSA PCR SCREENING: MRSA by PCR: POSITIVE — AB

## 2018-04-16 MED ORDER — SODIUM CHLORIDE 0.9 % IV SOLN: INTRAVENOUS | Status: DC

## 2018-04-16 MED ORDER — JEVITY 1.2 CAL PO LIQD
1000.0000 mL | ORAL | Status: DC
  Filled 2018-04-16: qty 1000

## 2018-04-16 MED ORDER — FLEET ENEMA 7-19 GM/118ML RE ENEM
1.0000 | ENEMA | Freq: Once | RECTAL | Status: DC
  Filled 2018-04-16: qty 1

## 2018-04-16 MED ORDER — DEXTROSE-NACL 5-0.9 % IV SOLN
INTRAVENOUS | Status: DC
  Administered 2018-04-16 (×2): via INTRAVENOUS

## 2018-04-16 MED ORDER — MELATONIN 3 MG PO TABS
3.0000 mg | ORAL_TABLET | Freq: Every day | ORAL | Status: DC
  Administered 2018-04-16 – 2018-04-18 (×3): 3 mg
  Filled 2018-04-16 (×4): qty 1

## 2018-04-16 MED ORDER — MUPIROCIN 2 % EX OINT
1.0000 "application " | TOPICAL_OINTMENT | Freq: Two times a day (BID) | CUTANEOUS | Status: DC
  Administered 2018-04-16 – 2018-04-19 (×7): 1 via NASAL
  Filled 2018-04-16 (×2): qty 22

## 2018-04-16 MED ORDER — FLEET ENEMA 7-19 GM/118ML RE ENEM
1.0000 | ENEMA | Freq: Once | RECTAL | Status: AC
  Administered 2018-04-17: 1 via RECTAL
  Filled 2018-04-16: qty 1

## 2018-04-16 MED ORDER — CHLORHEXIDINE GLUCONATE CLOTH 2 % EX PADS
6.0000 | MEDICATED_PAD | Freq: Every day | CUTANEOUS | Status: DC
  Administered 2018-04-17 – 2018-04-19 (×3): 6 via TOPICAL

## 2018-04-16 MED ORDER — OSMOLITE 1.5 CAL PO LIQD
237.0000 mL | Freq: Three times a day (TID) | ORAL | Status: DC
  Administered 2018-04-16 – 2018-04-19 (×10): 237 mL
  Filled 2018-04-16 (×11): qty 237

## 2018-04-16 NOTE — H&P (View-Only) (Signed)
Referring Provider: Dr. Kalman Jewels Primary Care Physician:  [name], MD Primary Gastroenterologist:  Dr. Hilarie Fredrickson   Reason for Consultation: Rectal Bleeding   HPI: [name] is a 85 y.o. male  The past medical history of CAD status post CABG 1983, stent 2004, CHF with EF 35 to 40%, ICD, atrial fibrillation emetine stopped in 2013 due to subdural hematoma, COPD, diabetes, colon polyps, colon angiectasia's., dysphagia with PEG ? 2015. History of small bowel pneumoatosis with mesenteric venous gas S/P exploratory laparotomy with lysis of adhesions 09/30/2017. He presented to the ED 04/15/2018 with complaints of bright red blood per the rectum x1 day.  Reports seeing bright red blood mixed in his stool x 2 weeks.  No blood clots noticed. No associated abdominal or rectal pain. He resides at a SNF. He presented to the ED by ambulance.  ED course: Sodium 136.  Potassium 4.1.  Chloride 98.  CO2 27.  Glucose 103.  BUN 36.  Creatinine 0.80.  WBC 7.1.  Hemoglobin 8.5.  Hematocrit 29.3.  MCV 98.3.  Platelet 214.  Abdominal/pelvic CAT scan: Moderate loculated right pleural effusion unchanged cough, cardiomegaly and pericardial patch, new left lower lobe tree-in-bud opacity may represent infection or aspiration,  liver is unremarkable, percutaneous gastrostomy gastrostomy tube noted, no evidence of bowel obstruction or wall thickening  GI history: Flexible sigmoidoscopy 02/08/2017 by Dr. Henrene Pastor: A single (solitary) ulcer in the distal rectum, the sigmoid colon is normal. PEG placed secondary to dysphagia, on Jevity TF  12/16/13-OV, Alonza Bogus, PA-C: dysphagia and GERD-oropharyngeal-esophagram and speech path referral decline-continued on Dexilant 60mg  qd and carafate was added 07/07/11-Colonoscopy, Dr. Hilarie Fredrickson: Impression: 2 angioectasias in ascending colon ablated with APC, 3 polyps in descending colon removed, sessile polyp in sigmoid colon removed and mild diverticulosis in the left colon;  Pathology: adenomatous polyps-repeat recommended in 3 yrs 02/11/11-EGD, Dr. Benson Norway: Impression:?distal esophageal varix vs vascular bleb? Portal htn gastropathy Colonoscopy by Dr. Lajoyce Corners 15+ years ago  Presented to the ED with abdominal pain positive FOBT 02/07/18/2019  Echo 03/29/2017 Left ventricle:  The cavity size was normal. Systolic function was reduced. Diffuse hypokinesis. The study was not technically sufficient to allow evaluation of LV diastolic dysfunction due to atrial fibrillation. There was no evidence of elevated ventricular filling pressure by Doppler parameters. Past Medical History:  Diagnosis Date   Abnormal thyroid scan    Abnormal thyroid imaging studies from 11/09/2010, status post ultrasound guided fine needle aspiration of the dominant left inferior thyroid nodule on 12/15/2010. Cytology report showed rare follicular epithelial cells and hemosiderin laden macrophages.   Adenomatous colon polyp    AICD (automatic cardioverter/defibrillator) present    a. fx lead; a. s/p lead extraction 03/02/17   Arthritis    "all over"   Asthma    Atrial fibrillation ([place])    on chronic Coumadin; stopped July 2013 due to subdural hematomas   CHF (congestive heart failure) (HCC)    EF 35-40% s/p most recent ICD generator change-out with Medtronic dual-chamber ICD 05/20/11 with explantation of previous abdominally-implanted device   Chronic systolic heart failure- EF 35-40% 12/30/2009    Ejection fraction of 35-40% with an ischemic cardiomyopathy.   Coronary artery disease    s/p CABG 1983 and PCI/stent 2004.    Diabetes mellitus    diet controlled   Diverticulosis    Dyslipidemia    Enteritis    Erythrocytosis    GERD (gastroesophageal reflux disease)    Hepatic steatosis    Hypertension  Ischemic cardiomyopathy    WITH CHF   Monocytosis 04/17/2013   Myocardial infarction ([place]) 1983; ~ 1990   Pleural effusion    right   Pneumonia August 2013    Portal hypertensive gastropathy [facility])    Rectal ulcer    Renal calculi    Subdural hematoma [facility]) July 2013   Anticoagulation stopped.    SunDown syndrome    VT (ventricular tachycardia) ([place])     Past Surgical History:  Procedure Laterality Date   CHOLECYSTECTOMY     COLONOSCOPY  07/07/2011   Procedure: COLONOSCOPY;  Surgeon: Jerene Bears, MD;  Location: [facility];  Service: Gastroenterology;  Laterality: N/A;   CORONARY ANGIOPLASTY WITH STENT PLACEMENT  2004   Tandem Cypher stents LAD   CORONARY ARTERY BYPASS GRAFT  1983   SVG-mLAD   ESOPHAGOGASTRODUODENOSCOPY  02/11/2011   Procedure: ESOPHAGOGASTRODUODENOSCOPY (EGD);  Surgeon: Beryle Beams, MD;  Location: [facility];  Service: Endoscopy;  Laterality: N/A;   FLEXIBLE SIGMOIDOSCOPY N/A 02/08/2017   Procedure: FLEXIBLE SIGMOIDOSCOPY;  Surgeon: Irene Shipper, MD;  Location: [facility];  Service: Endoscopy;  Laterality: N/A;   ICD GENERATOR CHANGEOUT N/A 04/08/2016   Procedure: ICD Generator Changeout;  Surgeon: Evans Lance, MD;  Location: [place] [facility];  Service: Cardiovascular;  Laterality: N/A;   ICD LEAD REMOVAL N/A 03/02/2017   Procedure: ICD LEAD REMOVAL;  Surgeon: Evans Lance, MD;  Location: [place];  Service: Cardiovascular;  Laterality: N/A;   IMPLANTABLE CARDIOVERTER DEFIBRILLATOR (ICD) GENERATOR CHANGE N/A 05/20/2011   Procedure: ICD GENERATOR CHANGE;  Surgeon: Evans Lance, MD;  Medtronic secure dual-chamber ICD serial number [id number]    IR GASTROSTOMY TUBE MOD SED  04/19/2017   KNEE ARTHROSCOPY     right; "just went in and scraped it"   LAPAROTOMY N/A 09/30/2017   Procedure: LAPAROTOMY, LYSIS OF ADHESION;  Surgeon: Rolm Bookbinder, MD;  Location: [place];  Service: General;  Laterality: N/A;   LEAD INSERTION N/A 03/02/2017   Procedure: LEAD INSERTION;  Surgeon: Evans Lance, MD;  Location: [place];  Service: Cardiovascular;  Laterality: N/A;   MASS EXCISION Right 05/10/2013   Procedure:  EXCISION MASS RIGHT THUMB;  Surgeon: Wynonia Sours, MD;  Location: [place];  Service: Orthopedics;  Laterality: Right;   PROXIMAL INTERPHALANGEAL FUSION (PIP) Right 05/10/2013   Procedure: DEBRIDEMENT PROXIMAL INTERPHALANGEAL FUSION (PIP);  Surgeon: Wynonia Sours, MD;  Location: [place];  Service: Orthopedics;  Laterality: Right;   TONSILLECTOMY          Prior to Admission medications   Medication Sig Start Date End Date Taking? Authorizing Provider  acetaminophen (TYLENOL) 325 MG tablet Give 2 tablets via PEG - tube every 6 hours for stomach pain 10/11/17  Yes [provider]  ADVAIR DISKUS 250-50 MCG/DOSE AEPB Inhale 1 puff into the lungs every 12 (twelve) hours.  11/13/14  Yes [provider]  Artificial Saliva (BIOTENE MOISTURIZING MOUTH) SOLN See admin instructions. Give 1 swab by mouth every 4 hours 05/04/17  Yes [provider]  digoxin (LANOXIN) 0.25 MG tablet Place 0.25 mg into feeding tube daily. Hold for heart rate less than 60. 04/10/18  Yes [provider]  furosemide (LASIX) 40 MG tablet Place 40 mg into feeding tube 2 (two) times daily.   Yes [provider]  ipratropium (ATROVENT) 0.03 % nasal spray Place 1 spray into both nostrils 3 (three) times daily before meals. 06/02/17  Yes [provider]  ipratropium-albuterol (DUONEB) 0.5-2.5 (3) MG/3ML SOLN Take 3 mLs by nebulization every 4 (four) hours as needed (For congestion.).  10/06/17  Yes [provider]  Melatonin 3 MG TABS Take 3 mg by mouth at bedtime.   Yes [provider]  meloxicam (MOBIC) 7.5 MG tablet Take 7.5 mg by mouth daily. Via PEG-Tube 02/23/18  Yes [provider]  metoprolol tartrate (LOPRESSOR) 25 MG tablet Place 1 tablet (25 mg total) into feeding tube 2 (two) times daily. 10/06/17  Yes Simaan, Darci Current, PA-C  nitroGLYCERIN (NITROSTAT) 0.4 MG SL tablet Place 1 tablet (0.4 mg total) under the tongue every  5 (five) minutes as needed for chest pain. 04/26/17  Yes Eugenie Filler, MD  NON FORMULARY Regular diet - Pureed Texture, Nectar thickened Fluids consistency.   Patient may have water (thin) and pleasure foods (puree texture and nectar thick liquids upon request   Yes [provider]  Nutritional Supplements (FEEDING SUPPLEMENT, JEVITY 1.5 CAL,) LIQD Jevity 1.5 -  Give 237 ml every 4 hours per peg tube 06/27/17  Yes [provider]  omeprazole (PRILOSEC) 40 MG capsule Take 40 mg by mouth at bedtime. Via Peg Tube.   Yes [provider]  ondansetron (ZOFRAN) 4 MG tablet Place 4 mg into feeding tube every 6 (six) hours as needed for nausea or vomiting. 05/10/17  Yes [provider]  OXYGEN Inhale 2 L/min into the lungs daily as needed. To maintain sats less than 90%   Yes [provider]  polyethylene glycol (MIRALAX / GLYCOLAX) packet Place 17 g into feeding tube 2 (two) times daily.  05/12/17  Yes [provider]  potassium chloride 20 MEQ/15ML (10%) SOLN Place 20 mEq into feeding tube daily. 10/07/17  Yes [provider]  RESTASIS 0.05 % ophthalmic emulsion INSTILL 1 DROP INTO BOTH EYES TWICE A DAY Patient taking differently: Place 1 drop into both eyes 2 (two) times daily.  11/17/16  Yes [name], MD  tiotropium (SPIRIVA) 18 MCG inhalation capsule Place 18 mcg into inhaler and inhale daily. 05/03/17  Yes [provider]  Water For Irrigation, Sterile (FREE WATER) SOLN Place 120 mLs into feeding tube every 4 (four) hours. 120 ml H2o flushes via tube every 4 hours before and after meds, before initiating feeding or when there is an interruption of feeding to maintain tube patency.   Yes [provider]  furosemide (LASIX) 20 MG tablet Take 20 mg by mouth 2 (two) times daily. 04/21/18   [provider]    Current Facility-Administered Medications  Medication Dose Route Frequency Provider Last Rate Last Dose    0.9 %  sodium chloride infusion   Intravenous Continuous Lacretia Leigh, MD       chlorhexidine (PERIDEX) 0.12 % solution 15 mL  15 mL Mouth Rinse BID Irene Pap N, DO   15 mL at 04/15/18 2136   Chlorhexidine Gluconate Cloth 2 % PADS 6 each  6 each Topical Q0600 Irene Pap N, DO       dextrose 5 %-0.9 % sodium chloride infusion   Intravenous Continuous Donne Hazel, MD 75 mL/hr at 04/16/18 1157     digoxin (LANOXIN) tablet 0.25 mg  0.25 mg Per Tube Daily Allie Bossier, MD   0.25 mg at 04/16/18 0941   feeding supplement (OSMOLITE 1.5 CAL) liquid 237 mL  237 mL Per Tube TID Donne Hazel, MD       free water 120 mL  120  mL Per Tube Q4H Hall, Carole N, DO   120 mL at 04/16/18 1157   ipratropium (ATROVENT) 0.03 % nasal spray 1 spray  1 spray Each Nare TID AC Hall, Carole N, DO       ipratropium-albuterol (DUONEB) 0.5-2.5 (3) MG/3ML nebulizer solution 3 mL  3 mL Nebulization Q4H PRN Irene Pap N, DO       MEDLINE mouth rinse  15 mL Mouth Rinse q12n4p Kayleen Memos, DO       Melatonin TABS 3 mg  3 mg Per Tube QHS Allie Bossier, MD       mometasone-formoterol [facility]) 200-5 MCG/ACT inhaler 2 puff  2 puff Inhalation BID Irene Pap N, DO   2 puff at 04/16/18 5284   mupirocin ointment (BACTROBAN) 2 % 1 application  1 application Nasal BID Kayleen Memos, DO   1 application at 13/24/40 1027   omeprazole (PRILOSEC) capsule 40 mg  40 mg Oral Daily Irene Pap N, DO   40 mg at 04/16/18 0941   umeclidinium bromide (INCRUSE ELLIPTA) 62.5 MCG/INH 1 puff  1 puff Inhalation Daily Irene Pap N, DO   1 puff at 04/16/18 2536    Allergies as of 04/15/2018 - Review Complete 04/15/2018  Allergen Reaction Noted   Tamsulosin Other (See Comments) 08/22/2013   Celebrex [celecoxib] Hives, Nausea And Vomiting, and Other (See Comments) 07/14/2010   Dronedarone Nausea And Vomiting and Other (See Comments) 08/15/2014   Esomeprazole magnesium Hives and Other (See Comments) 07/14/2010    Digoxin Diarrhea and Other (See Comments)    Hydrocodone-acetaminophen Other (See Comments) 11/11/2011   Protonix [pantoprazole sodium] Nausea And Vomiting and Other (See Comments) 09/25/2011    Family History  Problem Relation Age of Onset   Tuberculosis Mother    Tuberculosis Father    Heart disease Brother    Diabetes Sister    Diabetes Brother    Clotting disorder Brother     Social History   Socioeconomic History   Marital status: Divorced    Spouse name: Not on file   Number of children: 2   Years of education: Not on file   Highest education level: Not on file  Occupational History   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  Social Needs   Financial resource strain: Not hard at all   Food insecurity:    Worry: Never true    Inability: Never true   Transportation needs:    Medical: No    Non-medical: No  Tobacco Use   Smoking status: Former Smoker    Packs/day: 2.00    Years: 30.00    Pack years: 60.00    Types: Cigarettes    Last attempt to quit: 06/23/1976    Years since quitting: 41.8   Smokeless tobacco: Never Used  Substance and Sexual Activity   Alcohol use: No   Drug use: No   Sexual activity: Never  Lifestyle   Physical activity:    Days per week: 7 days    Minutes per session: 60 min   Stress: Not at all  Relationships   Social connections:    Talks on phone: Once a week    Gets together: Once a week    Attends religious service: Never    Active member of club or organization: No    Attends meetings of clubs or organizations: Never    Relationship status: Divorced   Intimate partner violence:    Fear of current or ex partner:  No    Emotionally abused: No    Physically abused: No    Forced sexual activity: No  Other Topics Concern   Not on file  Social History Narrative   Not on file    Review of Systems: Gen: + fatigue CV: No chest pain or palpitations Resp: Lungs clear throughout GI: see HPI GU:  No dysuria, voiding without difficulty  MS: No new joint pain or muscle aches  Derm:. No known rash, wound on left lower leg  Psych: Denies depression or anxiety. Heme: Denies bruising, bleeding, and enlarged lymph nodes. Neuro:  Denies any headaches, dizziness, paresthesias. Endo:  Denies any problems with DM, thyroid, adrenal function.  Physical Exam: Vital signs in last 24 hours: Temp:  [97.7 F (36.5 C)-98.6 F (37 C)] 98.6 F (37 C) (03/16 0511) Pulse Rate:  [39-158] 67 (03/16 0511) Resp:  [16-22] 19 (03/16 0511) BP: (99-131)/(47-98) 115/65 (03/16 0511) SpO2:  [93 %-99 %] 96 % (03/16 0511) Weight:  [68.3 kg] 68.3 kg (03/16 0100) Last BM Date: 04/15/18 General:   Alert,  Well-developed, well-nourished, pleasant and cooperative in NAD Head:  Normocephalic and atraumatic. Eyes:  Sclera clear, no icterus.   Conjunctiva pink. Mouth:  No deformity or lesions.   Neck:  Supple; no masses or thyromegaly. Lungs: Clear throughout. Heart:  Regular rate and rhythm; no murmurs, clicks, rubs,  or gallops. Abdomen:  Soft,nontender, nondistended, LUQ with PEG tube, firm area adjacent to PEG. Rectal: Bright red blood on bed sheet, small superficial posterior skin tear 2 to 3 cm above anus oozing bright red blood, rectal exam limited, prostate grossly enlarged and firm, no stool in the rectal vault. Msk:  Symmetrical without gross deformities. Pulses:  Normal pulses noted. Extremities:  Bilateral Pretibial edema with erythema c/w cellulitis, drsg to distal left pretibial area intact. Neurologic:  Alert and  oriented x2;  Skin:  See rectal exam and extremity exam. Psych:  Alert and cooperative. Normal mood and affect.  Intake/Output from previous day: No intake/output data recorded. Intake/Output this shift: No intake/output data recorded.  Lab Results: Recent Labs    04/15/18 1046 04/15/18 1851 04/16/18 0241  WBC 9.0  --  7.1  HGB 10.0* 9.1* 8.5*  HCT 33.0* 30.0* 29.3*  PLT 243   --  214   BMET Recent Labs    04/15/18 1046 04/16/18 0241  NA 135 136  K 4.3 4.1  CL 94* 98  CO2 30 27  GLUCOSE 121* 103*  BUN 41* 36*  CREATININE 0.93 0.80  CALCIUM 8.9 8.4*   LFT Recent Labs    04/15/18 1046  PROT 7.1  ALBUMIN 3.2*  AST 24  ALT 13  ALKPHOS 129*  BILITOT 0.7   Studies/Results: Ct Abdomen Pelvis W Contrast  Result Date: 04/15/2018 CLINICAL DATA:  85 year old male with acute abdominal and pelvic pain with distension. EXAM: CT ABDOMEN AND PELVIS WITH CONTRAST TECHNIQUE: Multidetector CT imaging of the abdomen and pelvis was performed using the standard protocol following bolus administration of intravenous contrast. CONTRAST:  121mL ISOVUE-300 IOPAMIDOL (ISOVUE-300) INJECTION 61% COMPARISON:  03/20/2018 and prior CTs FINDINGS: Lower chest: A moderate loculated RIGHT pleural effusion is unchanged. Cardiomegaly and pericardial patch again identified. New LEFT LOWER lobe tree-in-bud opacities may represent infection or aspiration. Hepatobiliary: The liver is unremarkable. The patient is status post cholecystectomy. No biliary dilatation. Pancreas: Unremarkable Spleen: Unchanged Adrenals/Urinary Tract: Bilateral renal atrophy and nonobstructing 5 mm LEFT LOWER pole renal calculus again noted. The adrenal glands and bladder  are unremarkable. Stomach/Bowel: Percutaneous gastrostomy tube again noted. There is no evidence of bowel obstruction, definite bowel wall thickening or inflammatory changes. Vascular/Lymphatic: Aortic atherosclerosis. No enlarged abdominal or pelvic lymph nodes. Reproductive: Prostate is unremarkable. Other: A small RIGHT inguinal hernia containing a loop of small bowel again identified. No ascites, focal collection/abscess or pneumoperitoneum. Musculoskeletal: No acute or suspicious bony abnormalities. Degenerative changes in the lumbar spine and hips again noted. IMPRESSION: 1. New mild LEFT LOWER lobe tree-in-bud opacities likely representing infection  or aspiration. 2. No acute abnormality or significant change within the abdomen or pelvis since 03/20/2018. 3. No significant change in moderate loculated RIGHT pleural effusion which may represent empyema. 4. Small RIGHT inguinal hernia containing small bowel. No small bowel obstruction or acute bowel abnormality. 5. Nonobstructing LEFT renal calculus 6. Cardiomegaly and Aortic Atherosclerosis (ICD10-I70.0). Electronically Signed   By: Margarette Canada M.D.   On: 04/15/2018 13:32    IMPRESSION/PLAN  1. 85 y.o. male with hematochezia. Hg 9.1 dropped to 8.5. (Base line Hg 11.5 03/20/2018). History of single solitary rectal ulcer syndrome per flex sig 01/2017, small superficial skin tear 2 cm above the posterior anal area. -follow H/H closely -to consider flexible sigmoidoscopy or anoscopy, if H/H continues to decrease will consider abd/pelvic CT angiogram with IV contrast. -transfuse for Hg < 8 -Desitin to apply to external anal area, avoid aggressive wiping after BMs -May need pink foam skin protectant to area below coccyx   2. Chronic dysphagia which required PEG Tube. RN reports PEG tube currently blocked -RN to re-flush PEG -Further recommendations per Dr. Marzella Schlein   2. History of atrial fibrillation, CAD  3. MRSA +   Addendum to the above plan:  Dr. [name] Lemma recommended EGD with Flex sigmoidoscopy 04/17/2018.      Patrecia Pour Kennedy-Smith  04/16/2018, 12:51 PM

## 2018-04-16 NOTE — Consult Note (Addendum)
Referring Provider: Dr. Kalman Jewels Primary Care Physician:  [name], MD Primary Gastroenterologist:  Dr. Hilarie Fredrickson   Reason for Consultation: Rectal Bleeding   HPI: [name] is a 85 y.o. male  The past medical history of CAD status post CABG 1983, stent 2004, CHF with EF 35 to 40%, ICD, atrial fibrillation emetine stopped in 2013 due to subdural hematoma, COPD, diabetes, colon polyps, colon angiectasia's., dysphagia with PEG ? 2015. History of small bowel pneumoatosis with mesenteric venous gas S/P exploratory laparotomy with lysis of adhesions 09/30/2017. He presented to the ED 04/15/2018 with complaints of bright red blood per the rectum x1 day.  Reports seeing bright red blood mixed in his stool x 2 weeks.  No blood clots noticed. No associated abdominal or rectal pain. He resides at a SNF. He presented to the ED by ambulance.  ED course: Sodium 136.  Potassium 4.1.  Chloride 98.  CO2 27.  Glucose 103.  BUN 36.  Creatinine 0.80.  WBC 7.1.  Hemoglobin 8.5.  Hematocrit 29.3.  MCV 98.3.  Platelet 214.  Abdominal/pelvic CAT scan: Moderate loculated right pleural effusion unchanged cough, cardiomegaly and pericardial patch, new left lower lobe tree-in-bud opacity may represent infection or aspiration,  liver is unremarkable, percutaneous gastrostomy gastrostomy tube noted, no evidence of bowel obstruction or wall thickening  GI history: Flexible sigmoidoscopy 02/08/2017 by Dr. Henrene Pastor: A single (solitary) ulcer in the distal rectum, the sigmoid colon is normal. PEG placed secondary to dysphagia, on Jevity TF  12/16/13-OV, Alonza Bogus, PA-C: dysphagia and GERD-oropharyngeal-esophagram and speech path referral decline-continued on Dexilant 60mg  qd and carafate was added 07/07/11-Colonoscopy, Dr. Hilarie Fredrickson: Impression: 2 angioectasias in ascending colon ablated with APC, 3 polyps in descending colon removed, sessile polyp in sigmoid colon removed and mild diverticulosis in the left colon;  Pathology: adenomatous polyps-repeat recommended in 3 yrs 02/11/11-EGD, Dr. Benson Norway: Impression:?distal esophageal varix vs vascular bleb? Portal htn gastropathy Colonoscopy by Dr. Lajoyce Corners 15+ years ago  Presented to the ED with abdominal pain positive FOBT 02/07/18/2019  Echo 03/29/2017 Left ventricle:  The cavity size was normal. Systolic function was reduced. Diffuse hypokinesis. The study was not technically sufficient to allow evaluation of LV diastolic dysfunction due to atrial fibrillation. There was no evidence of elevated ventricular filling pressure by Doppler parameters. Past Medical History:  Diagnosis Date   Abnormal thyroid scan    Abnormal thyroid imaging studies from 11/09/2010, status post ultrasound guided fine needle aspiration of the dominant left inferior thyroid nodule on 12/15/2010. Cytology report showed rare follicular epithelial cells and hemosiderin laden macrophages.   Adenomatous colon polyp    AICD (automatic cardioverter/defibrillator) present    a. fx lead; a. s/p lead extraction 03/02/17   Arthritis    "all over"   Asthma    Atrial fibrillation ([place])    on chronic Coumadin; stopped July 2013 due to subdural hematomas   CHF (congestive heart failure) (HCC)    EF 35-40% s/p most recent ICD generator change-out with Medtronic dual-chamber ICD 05/20/11 with explantation of previous abdominally-implanted device   Chronic systolic heart failure- EF 35-40% 12/30/2009    Ejection fraction of 35-40% with an ischemic cardiomyopathy.   Coronary artery disease    s/p CABG 1983 and PCI/stent 2004.    Diabetes mellitus    diet controlled   Diverticulosis    Dyslipidemia    Enteritis    Erythrocytosis    GERD (gastroesophageal reflux disease)    Hepatic steatosis    Hypertension  Ischemic cardiomyopathy    WITH CHF   Monocytosis 04/17/2013   Myocardial infarction ([place]) 1983; ~ 1990   Pleural effusion    right   Pneumonia August 2013    Portal hypertensive gastropathy [facility])    Rectal ulcer    Renal calculi    Subdural hematoma [facility]) July 2013   Anticoagulation stopped.    SunDown syndrome    VT (ventricular tachycardia) ([place])     Past Surgical History:  Procedure Laterality Date   CHOLECYSTECTOMY     COLONOSCOPY  07/07/2011   Procedure: COLONOSCOPY;  Surgeon: Jerene Bears, MD;  Location: [facility];  Service: Gastroenterology;  Laterality: N/A;   CORONARY ANGIOPLASTY WITH STENT PLACEMENT  2004   Tandem Cypher stents LAD   CORONARY ARTERY BYPASS GRAFT  1983   SVG-mLAD   ESOPHAGOGASTRODUODENOSCOPY  02/11/2011   Procedure: ESOPHAGOGASTRODUODENOSCOPY (EGD);  Surgeon: Beryle Beams, MD;  Location: [facility];  Service: Endoscopy;  Laterality: N/A;   FLEXIBLE SIGMOIDOSCOPY N/A 02/08/2017   Procedure: FLEXIBLE SIGMOIDOSCOPY;  Surgeon: Irene Shipper, MD;  Location: [facility];  Service: Endoscopy;  Laterality: N/A;   ICD GENERATOR CHANGEOUT N/A 04/08/2016   Procedure: ICD Generator Changeout;  Surgeon: Evans Lance, MD;  Location: [place] [facility];  Service: Cardiovascular;  Laterality: N/A;   ICD LEAD REMOVAL N/A 03/02/2017   Procedure: ICD LEAD REMOVAL;  Surgeon: Evans Lance, MD;  Location: [place];  Service: Cardiovascular;  Laterality: N/A;   IMPLANTABLE CARDIOVERTER DEFIBRILLATOR (ICD) GENERATOR CHANGE N/A 05/20/2011   Procedure: ICD GENERATOR CHANGE;  Surgeon: Evans Lance, MD;  Medtronic secure dual-chamber ICD serial number [id number]    IR GASTROSTOMY TUBE MOD SED  04/19/2017   KNEE ARTHROSCOPY     right; "just went in and scraped it"   LAPAROTOMY N/A 09/30/2017   Procedure: LAPAROTOMY, LYSIS OF ADHESION;  Surgeon: Rolm Bookbinder, MD;  Location: [place];  Service: General;  Laterality: N/A;   LEAD INSERTION N/A 03/02/2017   Procedure: LEAD INSERTION;  Surgeon: Evans Lance, MD;  Location: [place];  Service: Cardiovascular;  Laterality: N/A;   MASS EXCISION Right 05/10/2013   Procedure:  EXCISION MASS RIGHT THUMB;  Surgeon: Wynonia Sours, MD;  Location: [place];  Service: Orthopedics;  Laterality: Right;   PROXIMAL INTERPHALANGEAL FUSION (PIP) Right 05/10/2013   Procedure: DEBRIDEMENT PROXIMAL INTERPHALANGEAL FUSION (PIP);  Surgeon: Wynonia Sours, MD;  Location: [facility];  Service: Orthopedics;  Laterality: Right;   TONSILLECTOMY          Prior to Admission medications   Medication Sig Start Date End Date Taking? Authorizing Provider  acetaminophen (TYLENOL) 325 MG tablet Give 2 tablets via PEG - tube every 6 hours for stomach pain 10/11/17  Yes [provider]  ADVAIR DISKUS 250-50 MCG/DOSE AEPB Inhale 1 puff into the lungs every 12 (twelve) hours.  11/13/14  Yes [provider]  Artificial Saliva (BIOTENE MOISTURIZING MOUTH) SOLN See admin instructions. Give 1 swab by mouth every 4 hours 05/04/17  Yes [provider]  digoxin (LANOXIN) 0.25 MG tablet Place 0.25 mg into feeding tube daily. Hold for heart rate less than 60. 04/10/18  Yes [provider]  furosemide (LASIX) 40 MG tablet Place 40 mg into feeding tube 2 (two) times daily.   Yes [provider]  ipratropium (ATROVENT) 0.03 % nasal spray Place 1 spray into both nostrils 3 (three) times daily before meals. 06/02/17  Yes [provider]  ipratropium-albuterol (DUONEB) 0.5-2.5 (3) MG/3ML SOLN Take 3 mLs by nebulization every 4 (four) hours as needed (For congestion.).  10/06/17  Yes [provider]  Melatonin 3 MG TABS Take 3 mg by mouth at bedtime.   Yes [provider]  meloxicam (MOBIC) 7.5 MG tablet Take 7.5 mg by mouth daily. Via PEG-Tube 02/23/18  Yes [provider]  metoprolol tartrate (LOPRESSOR) 25 MG tablet Place 1 tablet (25 mg total) into feeding tube 2 (two) times daily. 10/06/17  Yes Simaan, Darci Current, PA-C  nitroGLYCERIN (NITROSTAT) 0.4 MG SL tablet Place 1 tablet (0.4 mg total) under the tongue every  5 (five) minutes as needed for chest pain. 04/26/17  Yes Eugenie Filler, MD  NON FORMULARY Regular diet - Pureed Texture, Nectar thickened Fluids consistency.   Patient may have water (thin) and pleasure foods (puree texture and nectar thick liquids upon request   Yes [provider]  Nutritional Supplements (FEEDING SUPPLEMENT, JEVITY 1.5 CAL,) LIQD Jevity 1.5 -  Give 237 ml every 4 hours per peg tube 06/27/17  Yes [provider]  omeprazole (PRILOSEC) 40 MG capsule Take 40 mg by mouth at bedtime. Via Peg Tube.   Yes [provider]  ondansetron (ZOFRAN) 4 MG tablet Place 4 mg into feeding tube every 6 (six) hours as needed for nausea or vomiting. 05/10/17  Yes [provider]  OXYGEN Inhale 2 L/min into the lungs daily as needed. To maintain sats less than 90%   Yes [provider]  polyethylene glycol (MIRALAX / GLYCOLAX) packet Place 17 g into feeding tube 2 (two) times daily.  05/12/17  Yes [provider]  potassium chloride 20 MEQ/15ML (10%) SOLN Place 20 mEq into feeding tube daily. 10/07/17  Yes [provider]  RESTASIS 0.05 % ophthalmic emulsion INSTILL 1 DROP INTO BOTH EYES TWICE A DAY Patient taking differently: Place 1 drop into both eyes 2 (two) times daily.  11/17/16  Yes [name], MD  tiotropium (SPIRIVA) 18 MCG inhalation capsule Place 18 mcg into inhaler and inhale daily. 05/03/17  Yes [provider]  Water For Irrigation, Sterile (FREE WATER) SOLN Place 120 mLs into feeding tube every 4 (four) hours. 120 ml H2o flushes via tube every 4 hours before and after meds, before initiating feeding or when there is an interruption of feeding to maintain tube patency.   Yes [provider]  furosemide (LASIX) 20 MG tablet Take 20 mg by mouth 2 (two) times daily. 04/21/18   [provider]    Current Facility-Administered Medications  Medication Dose Route Frequency Provider Last Rate Last Dose    0.9 %  sodium chloride infusion   Intravenous Continuous Lacretia Leigh, MD       chlorhexidine (PERIDEX) 0.12 % solution 15 mL  15 mL Mouth Rinse BID Irene Pap N, DO   15 mL at 04/15/18 2136   Chlorhexidine Gluconate Cloth 2 % PADS 6 each  6 each Topical Q0600 Irene Pap N, DO       dextrose 5 %-0.9 % sodium chloride infusion   Intravenous Continuous Donne Hazel, MD 75 mL/hr at 04/16/18 1157     digoxin (LANOXIN) tablet 0.25 mg  0.25 mg Per Tube Daily Allie Bossier, MD   0.25 mg at 04/16/18 0941   feeding supplement (OSMOLITE 1.5 CAL) liquid 237 mL  237 mL Per Tube TID Donne Hazel, MD       free water 120 mL  120  mL Per Tube Q4H Hall, Carole N, DO   120 mL at 04/16/18 1157   ipratropium (ATROVENT) 0.03 % nasal spray 1 spray  1 spray Each Nare TID AC Hall, Carole N, DO       ipratropium-albuterol (DUONEB) 0.5-2.5 (3) MG/3ML nebulizer solution 3 mL  3 mL Nebulization Q4H PRN Irene Pap N, DO       MEDLINE mouth rinse  15 mL Mouth Rinse q12n4p Kayleen Memos, DO       Melatonin TABS 3 mg  3 mg Per Tube QHS Allie Bossier, MD       mometasone-formoterol [facility]) 200-5 MCG/ACT inhaler 2 puff  2 puff Inhalation BID Irene Pap N, DO   2 puff at 04/16/18 9562   mupirocin ointment (BACTROBAN) 2 % 1 application  1 application Nasal BID Kayleen Memos, DO   1 application at 13/08/65 7846   omeprazole (PRILOSEC) capsule 40 mg  40 mg Oral Daily Irene Pap N, DO   40 mg at 04/16/18 0941   umeclidinium bromide (INCRUSE ELLIPTA) 62.5 MCG/INH 1 puff  1 puff Inhalation Daily Irene Pap N, DO   1 puff at 04/16/18 9629    Allergies as of 04/15/2018 - Review Complete 04/15/2018  Allergen Reaction Noted   Tamsulosin Other (See Comments) 08/22/2013   Celebrex [celecoxib] Hives, Nausea And Vomiting, and Other (See Comments) 07/14/2010   Dronedarone Nausea And Vomiting and Other (See Comments) 08/15/2014   Esomeprazole magnesium Hives and Other (See Comments) 07/14/2010    Digoxin Diarrhea and Other (See Comments)    Hydrocodone-acetaminophen Other (See Comments) 11/11/2011   Protonix [pantoprazole sodium] Nausea And Vomiting and Other (See Comments) 09/25/2011    Family History  Problem Relation Age of Onset   Tuberculosis Mother    Tuberculosis Father    Heart disease Brother    Diabetes Sister    Diabetes Brother    Clotting disorder Brother     Social History   Socioeconomic History   Marital status: Divorced    Spouse name: Not on file   Number of children: 2   Years of education: Not on file   Highest education level: Not on file  Occupational History   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  Social Needs   Financial resource strain: Not hard at all   Food insecurity:    Worry: Never true    Inability: Never true   Transportation needs:    Medical: No    Non-medical: No  Tobacco Use   Smoking status: Former Smoker    Packs/day: 2.00    Years: 30.00    Pack years: 60.00    Types: Cigarettes    Last attempt to quit: 06/23/1976    Years since quitting: 41.8   Smokeless tobacco: Never Used  Substance and Sexual Activity   Alcohol use: No   Drug use: No   Sexual activity: Never  Lifestyle   Physical activity:    Days per week: 7 days    Minutes per session: 60 min   Stress: Not at all  Relationships   Social connections:    Talks on phone: Once a week    Gets together: Once a week    Attends religious service: Never    Active member of club or organization: No    Attends meetings of clubs or organizations: Never    Relationship status: Divorced   Intimate partner violence:    Fear of current or ex partner:  No    Emotionally abused: No    Physically abused: No    Forced sexual activity: No  Other Topics Concern   Not on file  Social History Narrative   Not on file    Review of Systems: Gen: + fatigue CV: No chest pain or palpitations Resp: Lungs clear throughout GI: see HPI GU:  No dysuria, voiding without difficulty  MS: No new joint pain or muscle aches  Derm:. No known rash, wound on left lower leg  Psych: Denies depression or anxiety. Heme: Denies bruising, bleeding, and enlarged lymph nodes. Neuro:  Denies any headaches, dizziness, paresthesias. Endo:  Denies any problems with DM, thyroid, adrenal function.  Physical Exam: Vital signs in last 24 hours: Temp:  [97.7 F (36.5 C)-98.6 F (37 C)] 98.6 F (37 C) (03/16 0511) Pulse Rate:  [39-158] 67 (03/16 0511) Resp:  [16-22] 19 (03/16 0511) BP: (99-131)/(47-98) 115/65 (03/16 0511) SpO2:  [93 %-99 %] 96 % (03/16 0511) Weight:  [68.3 kg] 68.3 kg (03/16 0100) Last BM Date: 04/15/18 General:   Alert,  Well-developed, well-nourished, pleasant and cooperative in NAD Head:  Normocephalic and atraumatic. Eyes:  Sclera clear, no icterus.   Conjunctiva pink. Mouth:  No deformity or lesions.   Neck:  Supple; no masses or thyromegaly. Lungs: Clear throughout. Heart:  Regular rate and rhythm; no murmurs, clicks, rubs,  or gallops. Abdomen:  Soft,nontender, nondistended, LUQ with PEG tube, firm area adjacent to PEG. Rectal: Bright red blood on bed sheet, small superficial posterior skin tear 2 to 3 cm above anus oozing bright red blood, rectal exam limited, prostate grossly enlarged and firm, no stool in the rectal vault. Msk:  Symmetrical without gross deformities. Pulses:  Normal pulses noted. Extremities:  Bilateral Pretibial edema with erythema c/w cellulitis, drsg to distal left pretibial area intact. Neurologic:  Alert and  oriented x2;  Skin:  See rectal exam and extremity exam. Psych:  Alert and cooperative. Normal mood and affect.  Intake/Output from previous day: No intake/output data recorded. Intake/Output this shift: No intake/output data recorded.  Lab Results: Recent Labs    04/15/18 1046 04/15/18 1851 04/16/18 0241  WBC 9.0  --  7.1  HGB 10.0* 9.1* 8.5*  HCT 33.0* 30.0* 29.3*  PLT 243   --  214   BMET Recent Labs    04/15/18 1046 04/16/18 0241  NA 135 136  K 4.3 4.1  CL 94* 98  CO2 30 27  GLUCOSE 121* 103*  BUN 41* 36*  CREATININE 0.93 0.80  CALCIUM 8.9 8.4*   LFT Recent Labs    04/15/18 1046  PROT 7.1  ALBUMIN 3.2*  AST 24  ALT 13  ALKPHOS 129*  BILITOT 0.7   Studies/Results: Ct Abdomen Pelvis W Contrast  Result Date: 04/15/2018 CLINICAL DATA:  85 year old male with acute abdominal and pelvic pain with distension. EXAM: CT ABDOMEN AND PELVIS WITH CONTRAST TECHNIQUE: Multidetector CT imaging of the abdomen and pelvis was performed using the standard protocol following bolus administration of intravenous contrast. CONTRAST:  160mL ISOVUE-300 IOPAMIDOL (ISOVUE-300) INJECTION 61% COMPARISON:  03/20/2018 and prior CTs FINDINGS: Lower chest: A moderate loculated RIGHT pleural effusion is unchanged. Cardiomegaly and pericardial patch again identified. New LEFT LOWER lobe tree-in-bud opacities may represent infection or aspiration. Hepatobiliary: The liver is unremarkable. The patient is status post cholecystectomy. No biliary dilatation. Pancreas: Unremarkable Spleen: Unchanged Adrenals/Urinary Tract: Bilateral renal atrophy and nonobstructing 5 mm LEFT LOWER pole renal calculus again noted. The adrenal glands and bladder  are unremarkable. Stomach/Bowel: Percutaneous gastrostomy tube again noted. There is no evidence of bowel obstruction, definite bowel wall thickening or inflammatory changes. Vascular/Lymphatic: Aortic atherosclerosis. No enlarged abdominal or pelvic lymph nodes. Reproductive: Prostate is unremarkable. Other: A small RIGHT inguinal hernia containing a loop of small bowel again identified. No ascites, focal collection/abscess or pneumoperitoneum. Musculoskeletal: No acute or suspicious bony abnormalities. Degenerative changes in the lumbar spine and hips again noted. IMPRESSION: 1. New mild LEFT LOWER lobe tree-in-bud opacities likely representing infection  or aspiration. 2. No acute abnormality or significant change within the abdomen or pelvis since 03/20/2018. 3. No significant change in moderate loculated RIGHT pleural effusion which may represent empyema. 4. Small RIGHT inguinal hernia containing small bowel. No small bowel obstruction or acute bowel abnormality. 5. Nonobstructing LEFT renal calculus 6. Cardiomegaly and Aortic Atherosclerosis (ICD10-I70.0). Electronically Signed   By: Margarette Canada M.D.   On: 04/15/2018 13:32    IMPRESSION/PLAN  1. 85 y.o. male with hematochezia. Hg 9.1 dropped to 8.5. (Base line Hg 11.5 03/20/2018). History of single solitary rectal ulcer syndrome per flex sig 01/2017, small superficial skin tear 2 cm above the posterior anal area. -follow H/H closely -to consider flexible sigmoidoscopy or anoscopy, if H/H continues to decrease will consider abd/pelvic CT angiogram with IV contrast. -transfuse for Hg < 8 -Desitin to apply to external anal area, avoid aggressive wiping after BMs -May need pink foam skin protectant to area below coccyx   2. Chronic dysphagia which required PEG Tube. RN reports PEG tube currently blocked -RN to re-flush PEG -Further recommendations per Dr. Marzella Schlein   2. History of atrial fibrillation, CAD  3. MRSA +   Addendum to the above plan:  Dr. [name] Lemma recommended EGD with Flex sigmoidoscopy 04/17/2018.      Patrecia Pour Kennedy-Smith  04/16/2018, 12:51 PM

## 2018-04-16 NOTE — Anesthesia Preprocedure Evaluation (Addendum)
Anesthesia Evaluation  Patient identified by MRN, date of birth, ID band Patient awake and Patient confused  General Assessment Comment:Oriented to person only  Reviewed: Allergy & Precautions, NPO status , Patient's Chart, lab work & pertinent test results  History of Anesthesia Complications Negative for: history of anesthetic complications  Airway Mallampati: II  TM Distance: >3 FB Neck ROM: Full    Dental  (+) Dental Advisory Given, Edentulous Upper, Edentulous Lower   Pulmonary asthma , COPD,  COPD inhaler, former smoker,    Pulmonary exam normal breath sounds clear to auscultation       Cardiovascular hypertension, Pt. on medications + CAD, + Past MI, + Cardiac Stents (2004), + CABG (1983) and +CHF  Normal cardiovascular exam+ Cardiac Defibrillator  Rhythm:Irregular Rate:Normal     Neuro/Psych negative neurological ROS     GI/Hepatic Neg liver ROS, PUD, GERD  Medicated,  Endo/Other  diabetes, Type 2  Renal/GU Renal InsufficiencyRenal disease     Musculoskeletal  (+) Arthritis ,   Abdominal   Peds  Hematology  (+) anemia , Hgb 8.5 on 04/16/18   Anesthesia Other Findings Day of surgery medications reviewed with the patient.  Reproductive/Obstetrics                            Anesthesia Physical Anesthesia Plan  ASA: IV  Anesthesia Plan: MAC   Post-op Pain Management:    Induction:   PONV Risk Score and Plan: Treatment may vary due to age or medical condition and Propofol infusion  Airway Management Planned: Natural Airway and Nasal Cannula  Additional Equipment:   Intra-op Plan:   Post-operative Plan:   Informed Consent: I have reviewed the patients History and Physical, chart, labs and discussed the procedure including the risks, benefits and alternatives for the proposed anesthesia with the patient or authorized representative who has indicated his/her understanding and  acceptance.     Dental advisory given  Plan Discussed with: CRNA  Anesthesia Plan Comments:        Anesthesia Quick Evaluation

## 2018-04-16 NOTE — Evaluation (Signed)
Physical Therapy Evaluation Patient Details Name: [name] MRN: [id number] DOB: 06/29/1932 Today's Date: 04/16/2018   History of Present Illness  85 yo male admitted to ED 3/15 with rectal bleeding over the course of the past few days. PMH includes AICD, afi, OA, CHF, CAD with history of MI with coronary angiography and CABG, DM, HTN, PEG placement, SDH, PE, cardiomyopathy.   Clinical Impression   Pt presents with abdominal pain, dyspnea on exertion, LE weakness, difficulty performing bed mobility/transfers, and decreased activity tolerance. Pt to benefit from acute PT to address deficits. Pt ambulated short hallway distance, required rest break, and then pt able to ambulate to and from bathroom to toilet. PT recommending return to [place] when medically appropriate. PT to progress mobility as tolerated, and will continue to follow acutely.    Follow Up Recommendations SNF;Supervision for mobility/OOB    Equipment Recommendations  None recommended by PT    Recommendations for Other Services       Precautions / Restrictions Precautions Precautions: Fall;ICD/Pacemaker Restrictions Weight Bearing Restrictions: No      Mobility  Bed Mobility Overal bed mobility: Needs Assistance Bed Mobility: Supine to Sit     Supine to sit: Min guard;HOB elevated     General bed mobility comments: Min guard for safety. Very increased time, use of bedrails to come to sitting.   Transfers Overall transfer level: Needs assistance Equipment used: Rolling walker (2 wheeled) Transfers: Sit to/from Stand Sit to Stand: Min assist         General transfer comment: Min assist for steadying, verbal cuing for hand placement on RW once standing and pushing up from bed/grab bars in bathroom vs using both hands on RW to rise. Sit to stand x3 during session, once from bed, once from recliner, and once from toilet.  Ambulation/Gait Ambulation/Gait assistance: Min guard;+2  safety/equipment(chair follow) Gait Distance (Feet): 30 Feet(1x30 ft, 1x20 ft to and from bathroom) Assistive device: Rolling walker (2 wheeled) Gait Pattern/deviations: Step-through pattern;Decreased stride length;Trunk flexed Gait velocity: decr    General Gait Details: Min guard for safety. Verbal cuing for upright posture as pt with tendency to forward flex trunk suspected due to abdominal discomfort.   Stairs            Wheelchair Mobility    Modified Rankin (Stroke Patients Only)       Balance Overall balance assessment: Needs assistance(Pt reports no falls in the past year) Sitting-balance support: No upper extremity supported;Feet supported Sitting balance-Leahy Scale: Good     Standing balance support: Bilateral upper extremity supported Standing balance-Leahy Scale: Poor Standing balance comment: reliant on UE support                              Pertinent Vitals/Pain Pain Assessment: 0-10 Pain Score: 6  Pain Location: abdomen  Pain Descriptors / Indicators: Discomfort Pain Intervention(s): Limited activity within patient's tolerance;Monitored during session;Repositioned    Home Living Family/patient expects to be discharged to:: Skilled nursing facility([facility] )                      Prior Function Level of Independence: Needs assistance   Gait / Transfers Assistance Needed: Pt reports he walks with a RW mostly, and walks to and from dining hall.   ADL's / Homemaking Assistance Needed: Pt reports that aides help him "sometimes", but unable to give specifics on what ADLs/iADLs they assist him with.  Hand Dominance   Dominant Hand: Right    Extremity/Trunk Assessment   Upper Extremity Assessment Upper Extremity Assessment: Overall WFL for tasks assessed    Lower Extremity Assessment Lower Extremity Assessment: Generalized weakness    Cervical / Trunk Assessment Cervical / Trunk Assessment: Kyphotic   Communication   Communication: No difficulties  Cognition Arousal/Alertness: Awake/alert Behavior During Therapy: WFL for tasks assessed/performed Overall Cognitive Status: Within Functional Limits for tasks assessed                                        General Comments General comments (skin integrity, edema, etc.): Pt with small bloody stool during toileting. Pt reporting dyspnea post-ambulation, decreased with rest. Unable to monitor vitals during session, pocket telemetry monitor not turning on and portable pulse ox unable to pick up reading.     Exercises     Assessment/Plan    PT Assessment Patient needs continued PT services  PT Problem List Decreased strength;Decreased mobility;Decreased range of motion;Decreased activity tolerance;Decreased balance;Decreased knowledge of use of DME;Decreased safety awareness;Pain       PT Treatment Interventions DME instruction;Functional mobility training;Balance training;Gait training;Therapeutic activities;Neuromuscular re-education;Therapeutic exercise    PT Goals (Current goals can be found in the Care Plan section)  Acute Rehab PT Goals Patient Stated Goal: none stated PT Goal Formulation: With patient Time For Goal Achievement: 04/30/18 Potential to Achieve Goals: Good    Frequency Min 2X/week   Barriers to discharge        Co-evaluation               AM-PAC PT "6 Clicks" Mobility  Outcome Measure Help needed turning from your back to your side while in a flat bed without using bedrails?: A Little Help needed moving from lying on your back to sitting on the side of a flat bed without using bedrails?: A Little Help needed moving to and from a bed to a chair (including a wheelchair)?: A Little Help needed standing up from a chair using your arms (e.g., wheelchair or bedside chair)?: A Little Help needed to walk in hospital room?: A Little Help needed climbing 3-5 steps with a railing? : A Little 6  Click Score: 18    End of Session Equipment Utilized During Treatment: Gait belt Activity Tolerance: Patient limited by pain;Patient limited by fatigue Patient left: in chair;with call bell/phone within reach;with chair alarm set Nurse Communication: Mobility status(let NT know pt up in chair, RN unavailable) PT Visit Diagnosis: Other abnormalities of gait and mobility (R26.89);Muscle weakness (generalized) (M62.81)    Time: 1829-9371 PT Time Calculation (min) (ACUTE ONLY): 26 min   Charges:   PT Evaluation $PT Eval Low Complexity: 1 Low PT Treatments $Gait Training: 8-22 mins        Julien Girt, PT Acute Rehabilitation Services Pager [phone]  Office [phone]   Oneika Simonian D Elonda Husky 04/16/2018, 1:42 PM

## 2018-04-16 NOTE — Progress Notes (Signed)
PROGRESS NOTE    [name]  UUV:[id number] DOB: 1932/08/03 DOA: 04/15/2018 PCP: [name], MD    Brief Narrative:  85 y.o. male with medical history significant for chronic A. Fib post AICD, history of PE not on oral anticoagulation, dysphagia status post PEG tube placement, chronic systolic CHF, cardiomyopathy with LVEF 35 to 40%, subdural hematoma, chronic right pleural effusion who presented to [facility] ED with complaints of bright red blood per rectum x1 day.  Sudden onset yesterday evening.  Denies abdominal pain or nausea.  Denies dizziness, chest pain or dyspnea.  Denies constipation, straining, use of laxative or stool softener.  No other associated symptoms.  ED Course: Upon presentation to the ED, vital signs remarkable for tachycardia bradycardia, has AICD.  Lab studies remarkable for elevated lipase 54, positive FOBT, drop in hemoglobin from 11 to 10.  CT abdomen and pelvis with contrast unrevealing for any acute intra-abdominal findings however showed moderate right pleural effusion and possible left lower lobe infiltrates.  To note patient denies fever, chills, cough, dyspnea, or any respiratory symptoms.  Assessment & Plan:   Active Problems:   Rectal bleeding   Acute rectal bleeding Reports sudden onset of rectal bleed last night Reports persistent rectal bleed x4 at time Denies prior history of GI bleed or colonoscopy Hemoglobin dropped from 11 to 10 GI following. Currently NPO CT abdomen and pelvis with contrast unrevealing for any acute intra-abdominal findings Repeat CBC in AM  Acute blood loss anemia suspect secondary to lower GI bleed Management as stated above Maintain map greater than 65 Maintain hemoglobin greater than 8 due to underlying cardiac disease Hemodynamically stable at this time  Chronic right pleural effusion Asymptomatic with no hypoxia Denies any respiratory symptoms Independent review CT abdomen pelvis which also revealed  moderate right pleural effusion and possible infiltrates versus atelectasis in left lower lobe Remains afebrile Continue to monitor  Chronic A. fib post AICD/history of pulmonary embolism Not on anticoagulation Rate controlled on beta-blocker BP meds currently on hold. BP stable at this time  Hypotension, suspect related to GI bleed Hold off medications that can affect his blood pressure BP stable at present  Dysphagia status post PEG tube placement Resume tube feeding Dietary consult Maintain head of bed greater than 30 degree when feeding Pt NPOt  Elevated lipase Asymptomatic Lipase 54 LFTs unremarkable  Moderate protein calorie malnutrition Albumin 3.2 Suspect low BMI Resume PEG tube feeding Dietary consult  Chronic systolic CHF Does not appear to be in exacerbation Hold off Lasix for now Continue strict I's and O's  History of subdural hematoma Not on anticoagulation or antiplatelets No acute issues  DVT prophylaxis: SCD's Code Status: Full Family Communication: Pt in room Disposition Plan: Uncertain at this time  Consultants:   GI  Procedures:     Antimicrobials: Anti-infectives (From admission, onward)   None       Subjective: Without complaints now. Denies abd pain  Objective: Vitals:   04/15/18 2137 04/15/18 2222 04/16/18 0100 04/16/18 0511  BP: 106/60   115/65  Pulse: 61   67  Resp: 20   19  Temp: 97.7 F (36.5 C)   98.6 F (37 C)  TempSrc: Oral     SpO2: 98% 98%  96%  Weight:   68.3 kg   Height:   6\' 1"  (1.854 m)     Intake/Output Summary (Last 24 hours) at 04/16/2018 1457 Last data filed at 04/16/2018 1140 Gross per 24 hour  Intake 0 ml  Output --  Net 0 ml   Filed Weights   04/16/18 0100  Weight: 68.3 kg    Examination:  General exam: Appears calm and comfortable  Respiratory system: Clear to auscultation. Respiratory effort normal. Cardiovascular system: S1 & S2 heard, RRR.  Gastrointestinal system:  Abdomen is nondistended, soft and nontender. No organomegaly or masses felt. Normal bowel sounds heard. Central nervous system: Alert and oriented. No focal neurological deficits. Extremities: Symmetric 5 x 5 power. Skin: No rashes, lesions  Psychiatry: Judgement and insight appear normal. Mood & affect appropriate.   Data Reviewed: I have personally reviewed following labs and imaging studies  CBC: Recent Labs  Lab 04/15/18 1046 04/15/18 1851 04/16/18 0241  WBC 9.0  --  7.1  NEUTROABS 4.2  --   --   HGB 10.0* 9.1* 8.5*  HCT 33.0* 30.0* 29.3*  MCV 96.5  --  98.3  PLT 243  --  732   Basic Metabolic Panel: Recent Labs  Lab 04/15/18 1046 04/16/18 0241  NA 135 136  K 4.3 4.1  CL 94* 98  CO2 30 27  GLUCOSE 121* 103*  BUN 41* 36*  CREATININE 0.93 0.80  CALCIUM 8.9 8.4*   GFR: Estimated Creatinine Clearance: 67.6 mL/min (by C-G formula based on SCr of 0.8 mg/dL). Liver Function Tests: Recent Labs  Lab 04/15/18 1046  AST 24  ALT 13  ALKPHOS 129*  BILITOT 0.7  PROT 7.1  ALBUMIN 3.2*   Recent Labs  Lab 04/15/18 1046  LIPASE 54*   No results for input(s): AMMONIA in the last 168 hours. Coagulation Profile: No results for input(s): INR, PROTIME in the last 168 hours. Cardiac Enzymes: No results for input(s): CKTOTAL, CKMB, CKMBINDEX, TROPONINI in the last 168 hours. BNP (last 3 results) No results for input(s): PROBNP in the last 8760 hours. HbA1C: No results for input(s): HGBA1C in the last 72 hours. CBG: No results for input(s): GLUCAP in the last 168 hours. Lipid Profile: No results for input(s): CHOL, HDL, LDLCALC, TRIG, CHOLHDL, LDLDIRECT in the last 72 hours. Thyroid Function Tests: No results for input(s): TSH, T4TOTAL, FREET4, T3FREE, THYROIDAB in the last 72 hours. Anemia Panel: No results for input(s): VITAMINB12, FOLATE, FERRITIN, TIBC, IRON, RETICCTPCT in the last 72 hours. Sepsis Labs: No results for input(s): PROCALCITON, LATICACIDVEN in the  last 168 hours.  Recent Results (from the past 240 hour(s))  MRSA PCR Screening     Status: Abnormal   Collection Time: 04/15/18  9:36 PM  Result Value Ref Range Status   MRSA by PCR POSITIVE (A) NEGATIVE Final    Comment:        The GeneXpert MRSA Assay (FDA approved for NASAL specimens only), is one component of a comprehensive MRSA colonization surveillance program. It is not intended to diagnose MRSA infection nor to guide or monitor treatment for MRSA infections. RESULT CALLED TO, READ BACK BY AND VERIFIED WITH: CAUDLE,E RN @0042  ON 04/16/2018 JACKSON,K Performed at [facility], [place] [address] [place], [place] [address]      Radiology Studies: Ct Abdomen Pelvis W Contrast  Result Date: 04/15/2018 CLINICAL DATA:  85 year old male with acute abdominal and pelvic pain with distension. EXAM: CT ABDOMEN AND PELVIS WITH CONTRAST TECHNIQUE: Multidetector CT imaging of the abdomen and pelvis was performed using the standard protocol following bolus administration of intravenous contrast. CONTRAST:  120mL ISOVUE-300 IOPAMIDOL (ISOVUE-300) INJECTION 61% COMPARISON:  03/20/2018 and prior CTs FINDINGS: Lower chest: A moderate loculated RIGHT pleural effusion is unchanged.  Cardiomegaly and pericardial patch again identified. New LEFT LOWER lobe tree-in-bud opacities may represent infection or aspiration. Hepatobiliary: The liver is unremarkable. The patient is status post cholecystectomy. No biliary dilatation. Pancreas: Unremarkable Spleen: Unchanged Adrenals/Urinary Tract: Bilateral renal atrophy and nonobstructing 5 mm LEFT LOWER pole renal calculus again noted. The adrenal glands and bladder are unremarkable. Stomach/Bowel: Percutaneous gastrostomy tube again noted. There is no evidence of bowel obstruction, definite bowel wall thickening or inflammatory changes. Vascular/Lymphatic: Aortic atherosclerosis. No enlarged abdominal or pelvic lymph nodes. Reproductive: Prostate  is unremarkable. Other: A small RIGHT inguinal hernia containing a loop of small bowel again identified. No ascites, focal collection/abscess or pneumoperitoneum. Musculoskeletal: No acute or suspicious bony abnormalities. Degenerative changes in the lumbar spine and hips again noted. IMPRESSION: 1. New mild LEFT LOWER lobe tree-in-bud opacities likely representing infection or aspiration. 2. No acute abnormality or significant change within the abdomen or pelvis since 03/20/2018. 3. No significant change in moderate loculated RIGHT pleural effusion which may represent empyema. 4. Small RIGHT inguinal hernia containing small bowel. No small bowel obstruction or acute bowel abnormality. 5. Nonobstructing LEFT renal calculus 6. Cardiomegaly and Aortic Atherosclerosis (ICD10-I70.0). Electronically Signed   By: Margarette Canada M.D.   On: 04/15/2018 13:32    Scheduled Meds:  chlorhexidine  15 mL Mouth Rinse BID   Chlorhexidine Gluconate Cloth  6 each Topical Q0600   digoxin  0.25 mg Per Tube Daily   feeding supplement (OSMOLITE 1.5 CAL)  237 mL Per Tube TID   free water  120 mL Per Tube Q4H   ipratropium  1 spray Each Nare TID AC   mouth rinse  15 mL Mouth Rinse q12n4p   Melatonin  3 mg Per Tube QHS   mometasone-formoterol  2 puff Inhalation BID   mupirocin ointment  1 application Nasal BID   omeprazole  40 mg Oral Daily   umeclidinium bromide  1 puff Inhalation Daily   Continuous Infusions:  sodium chloride     dextrose 5 % and 0.9% NaCl 75 mL/hr at 04/16/18 1157     LOS: 1 day   Marylu Lund, MD [facility] Pager On Amion  If 7PM-7AM, please contact night-coverage 04/16/2018, 2:57 PM

## 2018-04-16 NOTE — Progress Notes (Signed)
Initial Nutrition Assessment  DOCUMENTATION CODES:   Severe malnutrition in context of chronic illness  INTERVENTION:   -Initiate Osmolite 1.5, 237 ml TID via PEG. -Once tolerating, will advance to 474 ml TID via PEG. -Continue free water flushes of 120 ml every 4 hours (720 ml) -At goal rate this provides 2130 kcal, 89g protein and 1806 ml H2O.  NUTRITION DIAGNOSIS:   Severe Malnutrition related to dysphagia, chronic illness as evidenced by severe fat depletion, severe muscle depletion.  GOAL:   Patient will meet greater than or equal to 90% of their needs  MONITOR:   Labs, Weight trends, TF tolerance, I & O's  REASON FOR ASSESSMENT:   Consult Enteral/tube feeding initiation and management, Assessment of nutrition requirement/status  ASSESSMENT:   85 y.o. male with medical history significant for chronic A. Fib post AICD, history of PE not on oral anticoagulation, dysphagia status post PEG tube placement, chronic systolic CHF. Admitted for rectal bleeding.   Patient in room, no family at bedside. Pt unable to provide any information regarding TF regimen at facility. Pt states he was still receiving gravity feeds at the facility, ~3 times a day. Was unable to specify formula or how much was given at each feeding. Is adamant he was tolerating his feeds PTA. Denies any N/V recently. Denies that he has been having PO.   Per chart review, pt was admitted to [facility] in September 2019. At that time pt was receiving Jevity 1.5 and then switched to Osmolite 1.5, 237 ml 6 times daily.  Will continue Osmolite 1.5 (fiber-free) given pt was admitted for rectal bleeding.   Per weight records, pt has lost 8 lb since May 2019, this is insignificant weight loss for time frame.   Labs reviewed. Medications: D5 -.9% NaCl infusion @ 75  NUTRITION - FOCUSED PHYSICAL EXAM:    Most Recent Value  Orbital Region  Mild depletion  Upper Arm Region  Severe depletion  Thoracic and Lumbar Region   Unable to assess  Buccal Region  Severe depletion  Temple Region  Severe depletion  Clavicle Bone Region  Severe depletion  Clavicle and Acromion Bone Region  Severe depletion  Scapular Bone Region  Severe depletion  Dorsal Hand  Moderate depletion  Patellar Region  Mild depletion  Anterior Thigh Region  Unable to assess  Posterior Calf Region  Mild depletion  Edema (RD Assessment)  Moderate  Hair  Reviewed  Skin  Reviewed       Diet Order:   Diet Order            Diet NPO time specified  Diet effective midnight              EDUCATION NEEDS:   No education needs have been identified at this time  Skin:  Skin Assessment: Reviewed RN Assessment  Last BM:  3/15  Height:   Ht Readings from Last 1 Encounters:  04/16/18 6\' 1"  (1.854 m)    Weight:   Wt Readings from Last 1 Encounters:  04/16/18 68.3 kg    Ideal Body Weight:  83.6 kg  BMI:  Body mass index is 19.87 kg/m.  Estimated Nutritional Needs:   Kcal:  2100-2300  Protein:  85-95g  Fluid:  2.1L/day   Joshua Bibles, MS, RD, LDN [facility] [facility] Pager: [phone] After Hours Pager: [phone]

## 2018-04-17 ENCOUNTER — Inpatient Hospital Stay (HOSPITAL_COMMUNITY): Payer: Medicare Other | Admitting: Anesthesiology

## 2018-04-17 ENCOUNTER — Encounter (HOSPITAL_COMMUNITY): Payer: Self-pay | Admitting: *Deleted

## 2018-04-17 ENCOUNTER — Encounter (HOSPITAL_COMMUNITY)
Admission: EM | Disposition: A | Discharge status: Skilled Nursing Facility | Payer: Self-pay | Source: Home / Self Care | Attending: Internal Medicine

## 2018-04-17 ENCOUNTER — Inpatient Hospital Stay (HOSPITAL_COMMUNITY): Payer: Medicare Other

## 2018-04-17 DIAGNOSIS — R799 Abnormal finding of blood chemistry, unspecified: Secondary | ICD-10-CM

## 2018-04-17 DIAGNOSIS — K5730 Diverticulosis of large intestine without perforation or abscess without bleeding: Secondary | ICD-10-CM

## 2018-04-17 DIAGNOSIS — K922 Gastrointestinal hemorrhage, unspecified: Secondary | ICD-10-CM

## 2018-04-17 DIAGNOSIS — K633 Ulcer of intestine: Secondary | ICD-10-CM

## 2018-04-17 DIAGNOSIS — K921 Melena: Secondary | ICD-10-CM

## 2018-04-17 HISTORY — PX: FLEXIBLE SIGMOIDOSCOPY: SHX5431

## 2018-04-17 HISTORY — PX: HEMOSTASIS CLIP PLACEMENT: SHX6857

## 2018-04-17 HISTORY — PX: SUBMUCOSAL INJECTION: SHX5543

## 2018-04-17 LAB — BASIC METABOLIC PANEL
Anion gap: 8 (ref 5–15)
BUN: 26 mg/dL — ABNORMAL HIGH (ref 8–23)
CO2: 26 mmol/L (ref 22–32)
Calcium: 8.4 mg/dL — ABNORMAL LOW (ref 8.9–10.3)
Chloride: 104 mmol/L (ref 98–111)
Creatinine, Ser: 0.76 mg/dL (ref 0.61–1.24)
GFR calc Af Amer: >60 mL/min (ref >60)
Glucose, Bld: 126 mg/dL — ABNORMAL HIGH (ref 70–99)
Potassium: 4.2 mmol/L (ref 3.5–5.1)
Sodium: 138 mmol/L (ref 135–145)

## 2018-04-17 LAB — CBC
HCT: 24 % — ABNORMAL LOW (ref 39.0–52.0)
HCT: 23.5 % — ABNORMAL LOW (ref 39.0–52.0)
Hemoglobin: 7.2 g/dL — ABNORMAL LOW (ref 13.0–17.0)
Hemoglobin: 7 g/dL — ABNORMAL LOW (ref 13.0–17.0)
MCH: 29.4 pg (ref 26.0–34.0)
MCH: 29.9 pg (ref 26.0–34.0)
MCHC: 30 g/dL (ref 30.0–36.0)
MCHC: 29.8 g/dL — ABNORMAL LOW (ref 30.0–36.0)
MCV: 98 fL (ref 80.0–100.0)
MCV: 100.4 fL — ABNORMAL HIGH (ref 80.0–100.0)
NRBC: 0 % (ref 0.0–0.2)
PLATELETS: 218 10*3/uL (ref 150–400)
Platelets: 179 10*3/uL (ref 150–400)
RBC: 2.45 MIL/uL — ABNORMAL LOW (ref 4.22–5.81)
RBC: 2.34 MIL/uL — AB (ref 4.22–5.81)
RDW: 16.5 % — AB (ref 11.5–15.5)
RDW: 16.8 % — ABNORMAL HIGH (ref 11.5–15.5)
WBC: 7.7 10*3/uL (ref 4.0–10.5)
WBC: 8 10*3/uL (ref 4.0–10.5)
nRBC: 0 % (ref 0.0–0.2)

## 2018-04-17 LAB — GLUCOSE, CAPILLARY
Glucose-Capillary: 126 mg/dL — ABNORMAL HIGH (ref 70–99)
Glucose-Capillary: 139 mg/dL — ABNORMAL HIGH (ref 70–99)
Glucose-Capillary: 172 mg/dL — ABNORMAL HIGH (ref 70–99)
Glucose-Capillary: 110 mg/dL — ABNORMAL HIGH (ref 70–99)

## 2018-04-17 SURGERY — SIGMOIDOSCOPY, FLEXIBLE
Anesthesia: Monitor Anesthesia Care

## 2018-04-17 MED ORDER — PROPOFOL 10 MG/ML IV BOLUS
INTRAVENOUS | Status: AC
  Filled 2018-04-17: qty 40

## 2018-04-17 MED ORDER — ONDANSETRON HCL 4 MG/2ML IJ SOLN
4.0000 mg | Freq: Four times a day (QID) | INTRAMUSCULAR | Status: DC | PRN
  Administered 2018-04-17: 4 mg via INTRAVENOUS
  Filled 2018-04-17: qty 2

## 2018-04-17 MED ORDER — SODIUM CHLORIDE 0.9 % IV SOLN: INTRAVENOUS | Status: DC

## 2018-04-17 MED ORDER — PROPOFOL 10 MG/ML IV BOLUS
INTRAVENOUS | Status: DC | PRN
  Administered 2018-04-17 (×2): 20 mg via INTRAVENOUS

## 2018-04-17 MED ORDER — SODIUM CHLORIDE (PF) 0.9 % IJ SOLN
PREFILLED_SYRINGE | INTRAMUSCULAR | Status: DC | PRN
  Administered 2018-04-17: 3 mL

## 2018-04-17 MED ORDER — PROPOFOL 500 MG/50ML IV EMUL
INTRAVENOUS | Status: DC | PRN
  Administered 2018-04-17: 75 ug/kg/min via INTRAVENOUS

## 2018-04-17 MED ORDER — EPINEPHRINE PF 1 MG/10ML IJ SOSY
PREFILLED_SYRINGE | INTRAMUSCULAR | Status: AC
  Filled 2018-04-17: qty 10

## 2018-04-17 MED ORDER — ZINC OXIDE 40 % EX OINT
TOPICAL_OINTMENT | Freq: Every day | CUTANEOUS | Status: DC
  Administered 2018-04-17 – 2018-04-19 (×13): via TOPICAL
  Filled 2018-04-17: qty 57

## 2018-04-17 MED ORDER — LACTATED RINGERS IV SOLN
INTRAVENOUS | Status: DC
  Administered 2018-04-17: 08:00:00 via INTRAVENOUS

## 2018-04-17 SURGICAL SUPPLY — 15 items

## 2018-04-17 NOTE — Transfer of Care (Signed)
Immediate Anesthesia Transfer of Care Note  [name]  Procedure(s) Performed: FLEXIBLE SIGMOIDOSCOPY (N/A ) ESOPHAGOGASTRODUODENOSCOPY (EGD) WITH PROPOFOL (N/A ) HEMOSTASIS CLIP PLACEMENT SUBMUCOSAL INJECTION  Patient Location: PACU and Endoscopy Unit  Anesthesia Type:MAC  Level of Consciousness: awake and responds to stimulation  Airway & Oxygen Therapy: Patient Spontanous Breathing and Patient connected to nasal cannula oxygen  Post-op Assessment: Report given to RN and Post -op Vital signs reviewed and stable  Post vital signs: Reviewed and stable  Last Vitals:  Vitals Value Taken Time  BP    Temp    Pulse    Resp    SpO2      Last Pain:  Vitals:   04/17/18 0800  TempSrc:   PainSc: 0-No pain         Complications: No apparent anesthesia complications

## 2018-04-17 NOTE — Progress Notes (Addendum)
Patient ID: [name], male   DOB: [birth year], 85 y.o.   MRN: [id number]  Hg dropped from 8.5 to 7.2. Anesthesiologist, Dr. Daiva Huge, reviewed H/H, ok to proceed with EGD/flex sig as scheduled. Patient is hemodynamically stable.

## 2018-04-17 NOTE — Anesthesia Postprocedure Evaluation (Signed)
Anesthesia Post Note  [name]  Procedure(s) Performed: FLEXIBLE SIGMOIDOSCOPY (N/A ) HEMOSTASIS CLIP PLACEMENT SUBMUCOSAL INJECTION     Patient location during evaluation: PACU Anesthesia Type: MAC Level of consciousness: awake and alert Pain management: pain level controlled Vital Signs Assessment: post-procedure vital signs reviewed and stable Respiratory status: spontaneous breathing, nonlabored ventilation and respiratory function stable Cardiovascular status: blood pressure returned to baseline and stable Postop Assessment: no apparent nausea or vomiting Anesthetic complications: no    Last Vitals:  Vitals:   04/17/18 0925 04/17/18 0930  BP:  (!) 142/52  Pulse: 67   Resp: 19 13  Temp:    SpO2: 96%     Last Pain:  Vitals:   04/17/18 0908  TempSrc: Oral  PainSc:                  Brennan Bailey

## 2018-04-17 NOTE — Interval H&P Note (Signed)
History and Physical Interval Note:  04/17/2018 8:24 AM  [name]  has presented today for surgery, with the diagnosis of hematochezia.  The various methods of treatment have been discussed with the patient and family. After consideration of risks, benefits and other options for treatment, the patient has consented to  Procedure(s): FLEXIBLE SIGMOIDOSCOPY (N/A) ESOPHAGOGASTRODUODENOSCOPY (EGD) WITH PROPOFOL (N/A) as a surgical intervention.  The patient's history has been reviewed, patient examined, no change in status, stable for surgery.  I have reviewed the patient's chart and labs.  Questions were answered to the patient's satisfaction.     Silvano Rusk

## 2018-04-17 NOTE — NC FL2 (Signed)
[place] LEVEL OF CARE SCREENING TOOL     IDENTIFICATION  Patient Name: [name] Birthdate: 02/11/1932 Sex: male Admission Date (Current Location): 04/15/2018  [facility] and [place] Number:  Herbalist and Address:  [facility],  [place], [place]      Provider Number: [id number]  Attending Physician Name and Address:  Donne Hazel, MD  Relative Name and Phone Number:  Lajoyce Lauber, [phone]    Current Level of Care: Hospital Recommended Level of Care: [place] Prior Approval Number:    Date Approved/Denied:   PASRR Number:    Discharge Plan: SNF    Current Diagnoses: Patient Active Problem List   Diagnosis Date Noted  . Hematochezia   . Colonic ulcer   . Acute blood loss anemia   . Elevated BUN   . Rectal bleeding 04/15/2018  . Protein-calorie malnutrition, severe 10/04/2017  . Small bowel ischemia ([place]) 09/30/2017  . Chronic pulmonary aspiration 07/31/2017  . H. pylori infection 07/24/2017  . Oral thrush 06/03/2017  . Chronic allergic rhinitis 05/14/2017  . Polyneuropathy 05/14/2017  . COPD (chronic obstructive pulmonary disease) ([place]) 05/02/2017  . Hypertensive heart disease with heart failure ([place]) 04/27/2017  . Adult failure to thrive   . DNR (do not resuscitate)   . Palliative care by specialist   . Malnutrition of moderate degree 04/13/2017  . CKD (chronic kidney disease), stage III ([place]) 04/07/2017  . Thyroid nodule 04/01/2017  . Chronic pulmonary embolism ([place]) 03/27/2017  . Failure of implantable cardioverter-defibrillator (ICD) lead 03/02/2017  . Chronic constipation 07/13/2016  . ICD (implantable cardioverter-defibrillator) in place 04/08/2016  . Pharyngoesophageal dysphagia 12/31/2013  . Gastroesophageal reflux disease without esophagitis 12/31/2013  . Monocytosis 04/17/2013  . Pleural effusion, right 10/03/2011  . Anemia, iron deficiency 12/29/2010  .  Coronary artery disease involving native coronary artery of native heart with angina pectoris ([place]) 06/25/2010  . Chronic atrial fibrillation 12/30/2009  . Chronic systolic heart failure- EF 35-40% 12/30/2009  . Automatic implantable cardioverter-defibrillator in situ 12/30/2009    Orientation RESPIRATION BLADDER Height & Weight     Self, Time, Situation, Place  Normal Continent Weight: 150 lb 9.2 oz (68.3 kg) Height:  6\' 1"  (185.4 cm)  BEHAVIORAL SYMPTOMS/MOOD NEUROLOGICAL BOWEL NUTRITION STATUS      Continent Diet(see dc summary)  AMBULATORY STATUS COMMUNICATION OF NEEDS Skin   Limited Assist Verbally                         Personal Care Assistance Level of Assistance  Bathing, Feeding, Dressing Bathing Assistance: Limited assistance Feeding assistance: Limited assistance Dressing Assistance: Limited assistance     Functional Limitations Info  Sight, Hearing, Speech Sight Info: Impaired Hearing Info: Adequate Speech Info: Adequate    SPECIAL CARE FACTORS FREQUENCY  PT (By licensed PT), OT (By licensed OT)     PT Frequency: 5x wk OT Frequency: 5x wk            Contractures Contractures Info: Not present    Additional Factors Info  Code Status, Allergies, Isolation Precautions Code Status Info: Full Code Allergies Info: TAMSULOSIN, CELEBREX CELECOXIB, DRONEDARONE, ESOMEPRAZOLE MAGNESIUM, DIGOXIN, HYDROCODONE-ACETAMINOPHEN, PROTONIX PANTOPRAZOLE SODIUM     Isolation Precautions Info: MRSA     Current Medications (04/17/2018):  This is the current hospital active medication list Current Facility-Administered Medications  Medication Dose Route Frequency Provider Last Rate Last Dose  . 0.9 %  sodium chloride  infusion   Intravenous Continuous Gatha Mayer, MD      . chlorhexidine (PERIDEX) 0.12 % solution 15 mL  15 mL Mouth Rinse BID Gatha Mayer, MD   15 mL at 04/17/18 1030  . Chlorhexidine Gluconate Cloth 2 % PADS 6 each  6 each Topical Q0600 Gatha Mayer, MD   6 each at 04/17/18 0544  . dextrose 5 %-0.9 % sodium chloride infusion   Intravenous Continuous Gatha Mayer, MD   Stopped at 04/17/18 0730  . digoxin (LANOXIN) tablet 0.25 mg  0.25 mg Per Tube Daily Gatha Mayer, MD   0.25 mg at 04/17/18 1029  . feeding supplement (OSMOLITE 1.5 CAL) liquid 237 mL  237 mL Per Tube TID Gatha Mayer, MD   237 mL at 04/17/18 1450  . free water 120 mL  120 mL Per Tube Q4H Gatha Mayer, MD   120 mL at 04/17/18 1229  . ipratropium (ATROVENT) 0.03 % nasal spray 1 spray  1 spray Each Nare TID AC Gatha Mayer, MD      . ipratropium-albuterol (DUONEB) 0.5-2.5 (3) MG/3ML nebulizer solution 3 mL  3 mL Nebulization Q4H PRN Gatha Mayer, MD      . liver oil-zinc oxide (DESITIN) 40 % ointment   Topical 6 X Daily Gatha Mayer, MD      . MEDLINE mouth rinse  15 mL Mouth Rinse q12n4p Gatha Mayer, MD      . Melatonin TABS 3 mg  3 mg Per Tube QHS Gatha Mayer, MD   3 mg at 04/16/18 2220  . mometasone-formoterol (DULERA) 200-5 MCG/ACT inhaler 2 puff  2 puff Inhalation BID Gatha Mayer, MD   2 puff at 04/16/18 2028  . mupirocin ointment (BACTROBAN) 2 % 1 application  1 application Nasal BID Gatha Mayer, MD   1 application at 82/80/03 1031  . omeprazole (PRILOSEC) capsule 40 mg  40 mg Oral Daily Gatha Mayer, MD   40 mg at 04/16/18 0941  . ondansetron (ZOFRAN) injection 4 mg  4 mg Intravenous Q6H PRN Gatha Mayer, MD   4 mg at 04/17/18 0209  . sodium phosphate (FLEET) 7-19 GM/118ML enema 1 enema  1 enema Rectal Once Gatha Mayer, MD      . umeclidinium bromide (INCRUSE ELLIPTA) 62.5 MCG/INH 1 puff  1 puff Inhalation Daily Gatha Mayer, MD   1 puff at 04/16/18 0807     Discharge Medications: Please see discharge summary for a list of discharge medications.  Relevant Imaging Results:  Relevant Lab Results:   Additional Information SS#: [id number]  Wende Neighbors, LCSW

## 2018-04-17 NOTE — Progress Notes (Signed)
CRITICAL VALUE ALERT  Critical Value:  hgb 7.2   Date & Time Notied:  3/17 0620  Provider Notified: y  Orders Received/Actions taken: pending

## 2018-04-17 NOTE — TOC Initial Note (Signed)
Transition of Care [facility]) - Initial/Assessment Note    Patient Details  Name: [name] MRN: [id number] Date of Birth: Jan 12, 1933  [facility] CM/SW Contact:    Wende Neighbors, LCSW Phone Number: 04/17/2018, 2:54 PM  Clinical Narrative:      CSW met patient at bedside to discuss discharge plans. patient sleepy during assessment with CSW . CSW spoke to patient about discharge plans. Patient stated he wants to go back to [place] once medically cleared by MD. CSW asked if he wanted CSW to contact family and patient stated no he did not. CSW to assist patient in returning back to [place]            Expected Discharge Plan: [place] Barriers to Discharge: Continued Medical Work up   Patient Goals and CMS Choice Patient states their goals for this hospitalization and ongoing recovery are:: pt wanting to return back to facility CMS Medicare.gov Compare Post Acute Care list provided to:: Other (Comment Required)(from [place] [facility])    Expected Discharge Plan and Services Expected Discharge Plan: [place]     Living arrangements for the past 2 months: Single Family Home                          Prior Living Arrangements/Services Living arrangements for the past 2 months: Single Family Home Lives with:: Self Patient language and need for interpreter reviewed:: Yes        Need for Family Participation in Patient Care: Yes (Comment) Care giver support system in place?: Yes (comment)   Criminal Activity/Legal Involvement Pertinent to Current Situation/Hospitalization: No - Comment as needed  Activities of Daily Living Home Assistive Devices/Equipment: Walker (specify type) ADL Screening (condition at time of admission) Patient's cognitive ability adequate to safely complete daily activities?: Yes Is the patient deaf or have difficulty hearing?: Yes Does the patient have difficulty seeing, even when wearing  glasses/contacts?: Yes Does the patient have difficulty concentrating, remembering, or making decisions?: Yes Patient able to express need for assistance with ADLs?: Yes Does the patient have difficulty dressing or bathing?: Yes Independently performs ADLs?: No Communication: Independent Dressing (OT): Needs assistance Is this a change from baseline?: Pre-admission baseline Grooming: Needs assistance Is this a change from baseline?: Pre-admission baseline Bathing: Needs assistance Is this a change from baseline?: Pre-admission baseline Toileting: Needs assistance Is this a change from baseline?: Pre-admission baseline In/Out Bed: Needs assistance Is this a change from baseline?: Pre-admission baseline Walks in Home: Needs assistance Is this a change from baseline?: Pre-admission baseline Does the patient have difficulty walking or climbing stairs?: Yes Weakness of Legs: Both Weakness of Arms/Hands: Both  Permission Sought/Granted Permission sought to share information with : Family Supports    Share Information with NAME: Lajoyce Lauber  Permission granted to share info w AGENCY: [place] granted to share info w Relationship: daughter  Permission granted to share info w Contact Information: [phone]  Emotional Assessment Appearance:: Appears stated age Attitude/Demeanor/Rapport: Engaged Affect (typically observed): Accepting Orientation: : Oriented to Self, Oriented to Place, Oriented to  Time, Oriented to Situation Alcohol / Substance Use: Not Applicable Psych Involvement: No (comment)  Admission diagnosis:  blood in stool Patient Active Problem List   Diagnosis Date Noted  . Hematochezia   . Colonic ulcer   . Acute blood loss anemia   . Elevated BUN   . Rectal bleeding 04/15/2018  . Protein-calorie malnutrition,  severe 10/04/2017  . Small bowel ischemia ([place]) 09/30/2017  . Chronic pulmonary aspiration 07/31/2017  . H. pylori infection 07/24/2017   . Oral thrush 06/03/2017  . Chronic allergic rhinitis 05/14/2017  . Polyneuropathy 05/14/2017  . COPD (chronic obstructive pulmonary disease) ([place]) 05/02/2017  . Hypertensive heart disease with heart failure ([place]) 04/27/2017  . Adult failure to thrive   . DNR (do not resuscitate)   . Palliative care by specialist   . Malnutrition of moderate degree 04/13/2017  . CKD (chronic kidney disease), stage III ([place]) 04/07/2017  . Thyroid nodule 04/01/2017  . Chronic pulmonary embolism ([place]) 03/27/2017  . Failure of implantable cardioverter-defibrillator (ICD) lead 03/02/2017  . Chronic constipation 07/13/2016  . ICD (implantable cardioverter-defibrillator) in place 04/08/2016  . Pharyngoesophageal dysphagia 12/31/2013  . Gastroesophageal reflux disease without esophagitis 12/31/2013  . Monocytosis 04/17/2013  . Pleural effusion, right 10/03/2011  . Anemia, iron deficiency 12/29/2010  . Coronary artery disease involving native coronary artery of native heart with angina pectoris ([place]) 06/25/2010  . Chronic atrial fibrillation 12/30/2009  . Chronic systolic heart failure- EF 35-40% 12/30/2009  . Automatic implantable cardioverter-defibrillator in situ 12/30/2009   PCP:  [name], MD Pharmacy:   [facility] [place] [address] [place] [address] Phone: [phone] Fax: [phone]     Social Determinants of Health ([place]) Interventions    Readmission Risk Interventions 30 Day Unplanned Readmission Risk Score     ED to Hosp-Admission (Current) from 04/15/2018 in [place] [facility]  30 Day Unplanned Readmission Risk Score (%)  19 Filed at 04/17/2018 1200     This score is the patient's risk of an unplanned readmission within 30 days of being discharged (0 -100%). The score is based on dignosis, age, lab data, medications, orders, and past utilization.   Low:  0-14.9   Medium: 15-21.9   High:  22-29.9   Extreme: 30 and above       No flowsheet data found.

## 2018-04-17 NOTE — Op Note (Signed)
[facility] Patient Name: [name] Procedure Date: 04/17/2018 MRN: [id number] Attending MD: Gatha Mayer , MD Date of Birth: April 16, 1932 CSN: [id number] Age: 85 Admit Type: Inpatient Procedure:                Flexible Sigmoidoscopy Indications:              Hematochezia Providers:                Gatha Mayer, MD, Cleda Daub, RN, William Dalton, Technician Referring MD:              Medicines:                Propofol per Anesthesia, Monitored Anesthesia Care Complications:            No immediate complications. Estimated Blood Loss:     Estimated blood loss: none. Procedure:                Pre-Anesthesia Assessment:                           - Prior to the procedure, a History and Physical                            was performed, and patient medications and                            allergies were reviewed. The patient's tolerance of                            previous anesthesia was also reviewed. The risks                            and benefits of the procedure and the sedation                            options and risks were discussed with the patient.                            All questions were answered, and informed consent                            was obtained. Prior Anticoagulants: The patient has                            taken no previous anticoagulant or antiplatelet                            agents. ASA Grade Assessment: III - A patient with                            severe systemic disease. After reviewing the risks  and benefits, the patient was deemed in                            satisfactory condition to undergo the procedure.                           After obtaining informed consent, the scope was                            passed under direct vision. The CF-[id number] ([id number])                            Olympus colonoscope was introduced through the anus        and advanced to the the splenic flexure. The                            flexible sigmoidoscopy was somewhat difficult due                            to poor endoscopic visualization. The quality of                            the bowel preparation was poor. Scope In: 8:33:39 AM Scope Out: 8:56:36 AM Total Procedure Duration: 0 hours 22 minutes 57 seconds  Findings:      The perianal and digital rectal examinations were normal.      A single (solitary) eight mm ulcer was found at the splenic flexure.       Oozing was present. Stigmata of recent bleeding were present. To stop       active bleeding, three hemostatic clips were successful and two       hemostatic clips were unsuccessfully placed (MR conditional). 3 cc total       1:10K EPI also injected after clipsThere was no bleeding at the end of       the procedure.      Multiple diverticula were found in the sigmoid colon.      Red blood was found in the rectum, in the sigmoid colon, in the       descending colon and at the splenic flexure. Impression:               - Preparation of the colon was poor.                           - A single (solitary) ulcer at the splenic flexure.                            Oozing blood and visible vessel protuberance seen.                            Clips (MR conditional) were placed. 3 clips - 2 16                            Con Med clips and 1 BS 360 - difficult to get on  proximal side of ulcer and could not actually do                            that so clips placed from distal aspect. PI                            injected x 3 cc to help. -                           - Diverticulosis in the sigmoid colon.                           - Blood in the rectum, in the sigmoid colon, in the                            descending colon and at the splenic flexure.                           - No specimens collected. Moderate Sedation:      Not Applicable - Patient had care per  Anesthesia. Recommendation:           - Return patient to hospital ward for ongoing care.                            Could restart tube feeds later today when                            hospitalist sees him. He may need some blood -                            recheck Hgb and transfuse if needed. Will also get                            a portable KUB to localize the clips in case we                            need that info later. Colonic ulcer with bleeding                            are unusual but do happen and he has                            atherosclerosis and A fib.                           If he needs another endoscopic exam would try to                            prep via PEG Procedure Code(s):        --- Professional ---                           [phone], Sigmoidoscopy, flexible; with control of  bleeding, any method Diagnosis Code(s):        --- Professional ---                           K63.3, Ulcer of intestine                           K62.5, Hemorrhage of anus and rectum                           K92.2, Gastrointestinal hemorrhage, unspecified                           K92.1, Melena (includes Hematochezia)                           K57.30, Diverticulosis of large intestine without                            perforation or abscess without bleeding CPT copyright 2018 American Medical Association. All rights reserved. The codes documented in this report are preliminary and upon coder review may  be revised to meet current compliance requirements. Gatha Mayer, MD 04/17/2018 9:15:36 AM This report has been signed electronically. Number of Addenda: 0

## 2018-04-17 NOTE — Anesthesia Procedure Notes (Signed)
Procedure Name: MAC Date/Time: 04/17/2018 8:30 AM Performed by: Lollie Sails, CRNA Pre-anesthesia Checklist: Patient identified, Emergency Drugs available, Suction available and Patient being monitored Oxygen Delivery Method: Nasal cannula

## 2018-04-17 NOTE — Consult Note (Signed)
[place] Nurse wound consult note Patient currently in endoscopy per primary RN Brittney Reason for Consult:skin tear close to anus Wound type: skin tear from excessive wiping due to rectal bleeding Per the primary RN, Brittney, the area does not appear infected, is small, is millimeters from the anus--too close for a foam dressing. Plan of care: Apply 40% Desitin to anal skin tear after cleansing with no rinse spray cleanser. Monitor the wound area(s) for worsening of condition such as: Signs/symptoms of infection,  Increase in size,  Development of or worsening of odor, Development of pain, or increased pain at the affected locations.  Notify the medical team if any of these develop.  Thank you for the consult.  Discussed plan of care with the patient and bedside nurse.  [place] nurse will not follow at this time.  Please re-consult the [place] team if needed.  Val Riles, RN, MSN, CWOCN, CNS-BC, pager [phone]

## 2018-04-17 NOTE — Progress Notes (Addendum)
PROGRESS NOTE    [name]  STM:[id number] DOB: [birth year] DOA: 04/15/2018 PCP: [name], MD    Brief Narrative:  85 y.o. male with medical history significant for chronic A. Fib post AICD, history of PE not on oral anticoagulation, dysphagia status post PEG tube placement, chronic systolic CHF, cardiomyopathy with LVEF 35 to 40%, subdural hematoma, chronic right pleural effusion who presented to [facility] ED with complaints of bright red blood per rectum x1 day.  Sudden onset yesterday evening.  Denies abdominal pain or nausea.  Denies dizziness, chest pain or dyspnea.  Denies constipation, straining, use of laxative or stool softener.  No other associated symptoms.  ED Course: Upon presentation to the ED, vital signs remarkable for tachycardia bradycardia, has AICD.  Lab studies remarkable for elevated lipase 54, positive FOBT, drop in hemoglobin from 11 to 10.  CT abdomen and pelvis with contrast unrevealing for any acute intra-abdominal findings however showed moderate right pleural effusion and possible left lower lobe infiltrates.  To note patient denies fever, chills, cough, dyspnea, or any respiratory symptoms.  Assessment & Plan:   Active Problems:   Rectal bleeding   Acute blood loss anemia   Elevated BUN   Hematochezia   Colonic ulcer   Acute rectal bleeding -Reports sudden onset of rectal bleed last night -Reports persistent rectal bleed x4 at time -Denies prior history of GI bleed or colonoscopy -GI following. Currently NPO with tube feeding -CT abdomen and pelvis with contrast unrevealing for any acute intra-abdominal findings -Pt underwent flex sig 3/18 with findings of solitary ulcer at splenic flexure with active oozing and visible vessel, s/p clipping -Hgb currently 7.0. Plan to transfuse if hgb <7.0. Will repeat CBC in AM  Acute blood loss anemia suspect secondary to lower GI bleed Hemodynamically stable at this time -Plan to transfuse with hgb <7.0   Chronic right pleural effusion -Asymptomatic with no hypoxia -Denies any respiratory symptoms -afebrile  Chronic A. fib post AICD/history of pulmonary embolism -Not on anticoagulation prior to admit -Rate controlled on beta-blocker -BP meds currently on hold given presenting bleeding. BP stable at this time  Hypotension, suspect related to GI bleed -Holding off medications that can affect his blood pressure -BP stable at present  Dysphagia status post PEG tube placement -Resumed tube feeding -Maintain head of bed greater than 30 degree when feeding  Elevated lipase -Asymptomatic -LFTs noted to be unremarkable  Severe protein calorie malnutrition -Continue PEG tube feeding -Dietary consulted  Chronic systolic CHF -Holding off Lasix for now given current anemia -Continue strict I's and O's  History of subdural hematoma -Not on anticoagulation or antiplatelets -No acute issues at this tiem  DVT prophylaxis: SCD's Code Status: Full Family Communication: Pt in room Disposition Plan: Uncertain at this time  Consultants:   GI  Procedures:   Flex sig 3/17  Antimicrobials: Anti-infectives (From admission, onward)   None      Subjective: No complaints at this time. Denies abd pain  Objective: Vitals:   04/17/18 0920 04/17/18 0925 04/17/18 0930 04/17/18 1341  BP: (!) 137/49  (!) 142/52 (!) 124/55  Pulse: 66 67  72  Resp: (!) 22 19 13 20   Temp:    98.2 F (36.8 C)  TempSrc:    Oral  SpO2: 98% 96%  96%  Weight:      Height:        Intake/Output Summary (Last 24 hours) at 04/17/2018 1456 Last data filed at 04/17/2018 0908 Gross per 24 hour  Intake 2199.58 ml  Output -  Net 2199.58 ml   Filed Weights   04/16/18 0100 04/17/18 0755  Weight: 68.3 kg 68.3 kg    Examination: General exam: Conversant, in no acute distress Respiratory system: normal chest rise, clear, no audible wheezing Cardiovascular system: regular rhythm, s1-s2  Gastrointestinal system: Nondistended, nontender, pos BS Central nervous system: No seizures, no tremors Extremities: No cyanosis, no joint deformities Skin: No rashes, pale Psychiatry: Affect normal // no auditory hallucinations   Data Reviewed: I have personally reviewed following labs and imaging studies  CBC: Recent Labs  Lab 04/15/18 1046 04/15/18 1851 04/16/18 0241 04/17/18 0548 04/17/18 1106  WBC 9.0  --  7.1 7.7 8.0  NEUTROABS 4.2  --   --   --   --   HGB 10.0* 9.1* 8.5* 7.2* 7.0*  HCT 33.0* 30.0* 29.3* 24.0* 23.5*  MCV 96.5  --  98.3 98.0 100.4*  PLT 243  --  214 218 778   Basic Metabolic Panel: Recent Labs  Lab 04/15/18 1046 04/16/18 0241 04/17/18 0548  NA 135 136 138  K 4.3 4.1 4.2  CL 94* 98 104  CO2 30 27 26   GLUCOSE 121* 103* 126*  BUN 41* 36* 26*  CREATININE 0.93 0.80 0.76  CALCIUM 8.9 8.4* 8.4*   GFR: Estimated Creatinine Clearance: 67.6 mL/min (by C-G formula based on SCr of 0.76 mg/dL). Liver Function Tests: Recent Labs  Lab 04/15/18 1046  AST 24  ALT 13  ALKPHOS 129*  BILITOT 0.7  PROT 7.1  ALBUMIN 3.2*   Recent Labs  Lab 04/15/18 1046  LIPASE 54*   No results for input(s): AMMONIA in the last 168 hours. Coagulation Profile: No results for input(s): INR, PROTIME in the last 168 hours. Cardiac Enzymes: No results for input(s): CKTOTAL, CKMB, CKMBINDEX, TROPONINI in the last 168 hours. BNP (last 3 results) No results for input(s): PROBNP in the last 8760 hours. HbA1C: No results for input(s): HGBA1C in the last 72 hours. CBG: Recent Labs  Lab 04/16/18 1625 04/16/18 1950 04/16/18 2339 04/17/18 0400 04/17/18 0952  GLUCAP 138* 166* 130* 126* 139*   Lipid Profile: No results for input(s): CHOL, HDL, LDLCALC, TRIG, CHOLHDL, LDLDIRECT in the last 72 hours. Thyroid Function Tests: No results for input(s): TSH, T4TOTAL, FREET4, T3FREE, THYROIDAB in the last 72 hours. Anemia Panel: No results for input(s): VITAMINB12, FOLATE,  FERRITIN, TIBC, IRON, RETICCTPCT in the last 72 hours. Sepsis Labs: No results for input(s): PROCALCITON, LATICACIDVEN in the last 168 hours.  Recent Results (from the past 240 hour(s))  Urine Culture     Status: Abnormal   Collection Time: 04/15/18 10:47 AM  Result Value Ref Range Status   Specimen Description   Final    URINE, RANDOM Performed at [facility] [address] [place], [place] [address]    Special Requests   Final    NONE Performed at [facility], [place] [address],  [place], [place] [address]    Culture (A)  Final    <10,000 COLONIES/mL INSIGNIFICANT GROWTH Performed at [facility] [address] [place], [place] [address]    Report Status 04/16/2018 FINAL  Final  MRSA PCR Screening     Status: Abnormal   Collection Time: 04/15/18  9:36 PM  Result Value Ref Range Status   MRSA by PCR POSITIVE (A) NEGATIVE Final    Comment:        The GeneXpert MRSA Assay (FDA approved for NASAL specimens  only), is one component of a comprehensive MRSA colonization surveillance program. It is not intended to diagnose MRSA infection nor to guide or monitor treatment for MRSA infections. RESULT CALLED TO, READ BACK BY AND VERIFIED WITH: CAUDLE,E RN @0042  ON 04/16/2018 JACKSON,K Performed at [facility], [place] [address] [place], [place] [address]      Radiology Studies: Dg Abd Portable 1v  Result Date: 04/17/2018 CLINICAL DATA:  85 year old male with hematochezia. Plain film to localize clips placed at sigmoidoscopy. Subsequent encounter. EXAM: PORTABLE ABDOMEN - 1 VIEW COMPARISON:  03/08/2018 plain film exam.  04/15/2018 CT. FINDINGS: Prior cholecystectomy accounting for clips in the right upper quadrant. New from prior examination are 5 metallic objects in the region of the cecum/ascending colon. Gastrostomy tube in place. Nonspecific bowel gas pattern. Can not assess for free air on supine view. Right pleural  effusion/pleural thickening. Cardiomegaly. AICD in place. Severe right hip joint degenerative changes. Vascular calcifications. IMPRESSION: New from prior examination are 5 metallic objects in the region of the cecum/ascending colon. Please see above. Electronically Signed   By: Genia Del M.D.   On: 04/17/2018 11:42    Scheduled Meds: . chlorhexidine  15 mL Mouth Rinse BID  . Chlorhexidine Gluconate Cloth  6 each Topical Q0600  . digoxin  0.25 mg Per Tube Daily  . feeding supplement (OSMOLITE 1.5 CAL)  237 mL Per Tube TID  . free water  120 mL Per Tube Q4H  . ipratropium  1 spray Each Nare TID AC  . liver oil-zinc oxide   Topical 6 X Daily  . mouth rinse  15 mL Mouth Rinse q12n4p  . Melatonin  3 mg Per Tube QHS  . mometasone-formoterol  2 puff Inhalation BID  . mupirocin ointment  1 application Nasal BID  . omeprazole  40 mg Oral Daily  . sodium phosphate  1 enema Rectal Once  . umeclidinium bromide  1 puff Inhalation Daily   Continuous Infusions: . sodium chloride    . dextrose 5 % and 0.9% NaCl 75 mL/hr at 04/16/18 2359     LOS: 2 days   Marylu Lund, MD [facility] Pager On Amion  If 7PM-7AM, please contact night-coverage 04/17/2018, 2:56 PM

## 2018-04-17 NOTE — Progress Notes (Signed)
PT Cancellation Note  Patient Details Name: [name] MRN: [id number] DOB: August 12, 1932   Cancelled Treatment:    Reason Eval/Treat Not Completed: Fatigue/lethargy limiting ability to participate - Pt sleeping upon arrival to room, and presents with pallor. Pt with low blood values. Will check back as schedule allows.  Julien Girt, PT Acute Rehabilitation Services Pager [phone]  Office [phone]    [place] 04/17/2018, 4:37 PM

## 2018-04-18 ENCOUNTER — Encounter (HOSPITAL_COMMUNITY)
Admission: EM | Disposition: A | Discharge status: Skilled Nursing Facility | Payer: Self-pay | Source: Home / Self Care | Attending: Internal Medicine

## 2018-04-18 ENCOUNTER — Encounter (HOSPITAL_COMMUNITY): Payer: Self-pay | Admitting: Internal Medicine

## 2018-04-18 DIAGNOSIS — D62 Acute posthemorrhagic anemia: Secondary | ICD-10-CM

## 2018-04-18 DIAGNOSIS — K633 Ulcer of intestine: Principal | ICD-10-CM

## 2018-04-18 LAB — HEMOGLOBIN AND HEMATOCRIT, BLOOD
HEMATOCRIT: 25.6 % — AB (ref 39.0–52.0)
Hemoglobin: 7.7 g/dL — ABNORMAL LOW (ref 13.0–17.0)

## 2018-04-18 LAB — CBC
HCT: 23.3 % — ABNORMAL LOW (ref 39.0–52.0)
Hemoglobin: 6.8 g/dL — CL (ref 13.0–17.0)
MCH: 29.6 pg (ref 26.0–34.0)
MCHC: 29.2 g/dL — ABNORMAL LOW (ref 30.0–36.0)
MCV: 101.3 fL — ABNORMAL HIGH (ref 80.0–100.0)
Platelets: 192 10*3/uL (ref 150–400)
RBC: 2.3 MIL/uL — ABNORMAL LOW (ref 4.22–5.81)
RDW: 17.1 % — AB (ref 11.5–15.5)
WBC: 8.2 10*3/uL (ref 4.0–10.5)
nRBC: 0 % (ref 0.0–0.2)

## 2018-04-18 LAB — PREPARE RBC (CROSSMATCH)

## 2018-04-18 LAB — GLUCOSE, CAPILLARY
GLUCOSE-CAPILLARY: 113 mg/dL — AB (ref 70–99)
GLUCOSE-CAPILLARY: 128 mg/dL — AB (ref 70–99)
Glucose-Capillary: 112 mg/dL — ABNORMAL HIGH (ref 70–99)
Glucose-Capillary: 163 mg/dL — ABNORMAL HIGH (ref 70–99)
Glucose-Capillary: 132 mg/dL — ABNORMAL HIGH (ref 70–99)
Glucose-Capillary: 121 mg/dL — ABNORMAL HIGH (ref 70–99)

## 2018-04-18 SURGERY — ESOPHAGOGASTRODUODENOSCOPY (EGD) WITH PROPOFOL
Anesthesia: Monitor Anesthesia Care

## 2018-04-18 MED ORDER — HYDROXYZINE HCL 10 MG PO TABS
5.0000 mg | ORAL_TABLET | Freq: Three times a day (TID) | ORAL | Status: DC | PRN
  Administered 2018-04-18 – 2018-04-19 (×2): 5 mg via ORAL
  Filled 2018-04-18 (×4): qty 1

## 2018-04-18 MED ORDER — DIPHENHYDRAMINE-ZINC ACETATE 2-0.1 % EX CREA
TOPICAL_CREAM | Freq: Two times a day (BID) | CUTANEOUS | Status: DC | PRN
  Administered 2018-04-19: 05:00:00 via TOPICAL
  Filled 2018-04-18: qty 28

## 2018-04-18 MED ORDER — SODIUM CHLORIDE 0.9% IV SOLUTION: Freq: Once | INTRAVENOUS | Status: DC

## 2018-04-18 MED ORDER — FUROSEMIDE 10 MG/ML PO SOLN
40.0000 mg | Freq: Two times a day (BID) | ORAL | Status: DC
  Administered 2018-04-18 – 2018-04-19 (×3): 40 mg
  Filled 2018-04-18 (×4): qty 4

## 2018-04-18 NOTE — Care Management Important Message (Signed)
Important Message  Patient Details  Name: [name] MRN: [id number] Date of Birth: December 04, 1932   Medicare Important Message Given:  Yes    Kerin Salen 04/18/2018, 10:25 AMImportant Message  Patient Details  Name: [name] MRN: [id number] Date of Birth: [birth year]   Medicare Important Message Given:  Yes    Kerin Salen 04/18/2018, 10:25 AM

## 2018-04-18 NOTE — Progress Notes (Addendum)
BSE done, full report to follow.  Pt familiar to this SLP from prior MBS 05/2017 when he was diagnosed with mild oral and severe pharyngeal dysphagia with gross residuals and aspiration of thin.  Today oral cavity is full of dried greenish, yellowish secretions.  SLP requested oral suction to be set up - assisted with this and helped pt with aggressive oral care.  Just with oral swabs, pt is coughing, hocking and expectorating.  Oral care required approximately 30 minutes.  Pt also was provided with single ice chips x3 which he would swish and hock and expectorate.  Suspect gross retained secretions in pharynx as well. Do not anticipate pt's baseline mild oral and severe pharyngeal dysphagia dx 05/2017 to be improved given his advancing age and current medical condition.       Recommend pt be npo except allowance of single ice chips and tsps water for him to swish and expectorate with the full expectation of some thin water to spill into pharynx to allow loosening of secretions with pt then expectorating them.  Pt's strong hocking ability was noted to be helping him to protect his airway on prior MBS.  Educated pt, nurse, and nurse tech thoroughly and provided swallow precaution signs.  Will follow up next date.  Thanks for this order.   Luanna Salk, [facility] SLP Acute Rehab Services Pager [phone] Office [phone]

## 2018-04-18 NOTE — Progress Notes (Signed)
PT Cancellation Note  Patient Details Name: [name] MRN: [id number] DOB: 1933/01/03   Cancelled Treatment:    Reason Eval/Treat Not Completed: Fatigue/lethargy limiting ability to participate;Patient declined, no reason specified - PT first checked on pt at 1200, pt receiving blood for HgB of 6.8. Upon second check at 1730, pt limited by fatigue and politely declined PT. Pt requesting PT tomorrow.  Julien Girt, PT Acute Rehabilitation Services Pager [phone]  Office [phone]    [place] 04/18/2018, 6:13 PM

## 2018-04-18 NOTE — Progress Notes (Signed)
PROGRESS NOTE  [name] WVP:[id number] DOB: 29-Sep-1932 DOA: 04/15/2018 PCP: [name], MD   LOS: 3 days   Brief Narrative / Interim history: 85 y.o.malewith medical history significant forchronic A. Fib post AICD,history of PE not on oral anticoagulation, dysphagia status post PEG tube placement, chronic systolic CHF, cardiomyopathy with LVEF 35 to 40%, subdural hematoma, chronic right pleural effusion who presented to [facility] with complaints of bright red blood per rectum x1 day. Sudden onset yesterday evening. Denies abdominal pain or nausea.Denies dizziness, chest pain or dyspnea. Denies constipation, straining, use of laxative or stool softener. No other associated symptoms   Subjective: Feeling well this morning, appears a bit confused as he does not recall the flexible sigmoidoscopy yesterday.  Denies any pain  Assessment & Plan: Active Problems:   Rectal bleeding   Acute blood loss anemia   Elevated BUN   Hematochezia   Colonic ulcer   Principal Problem Rectal bleed with acute blood loss anemia -Hemoglobin trending down less than 7 this morning, transfuse 1 unit of packed red blood cells -Gastroenterology consulted, patient status post flexible sigmoidoscopy on 3/17 with a solitary ulcer at the splenic flexure with active oozing and visible vessel, status post clipping  Active Problems Chronic A. fib, AICD in place, history of PE -Finished anticoagulation, currently not on any blood thinners, rate controlled on beta-blockers  Hyportension, suspected related to GI bleed -Hold blood pressure medications  Status post PEG tube placement -Resume tube feeding, patient states that he is eating, have asked speech therapy to evaluate  Severe protein calorie malnutrition -Continue PEG feeding  Chronic systolic CHF -Holding off Lasix given bleed, resume by tomorrow  History of subdural hematoma -Avoid anticoagulants and antiplatelets, not an acute issue    Scheduled Meds: . sodium chloride   Intravenous Once  . chlorhexidine  15 mL Mouth Rinse BID  . Chlorhexidine Gluconate Cloth  6 each Topical Q0600  . digoxin  0.25 mg Per Tube Daily  . feeding supplement (OSMOLITE 1.5 CAL)  237 mL Per Tube TID  . free water  120 mL Per Tube Q4H  . furosemide  40 mg Per Tube BID  . ipratropium  1 spray Each Nare TID AC  . liver oil-zinc oxide   Topical 6 X Daily  . mouth rinse  15 mL Mouth Rinse q12n4p  . Melatonin  3 mg Per Tube QHS  . mometasone-formoterol  2 puff Inhalation BID  . mupirocin ointment  1 application Nasal BID  . omeprazole  40 mg Oral Daily  . sodium phosphate  1 enema Rectal Once  . umeclidinium bromide  1 puff Inhalation Daily   Continuous Infusions: . sodium chloride     PRN Meds:.diphenhydrAMINE-zinc acetate, hydrOXYzine, ipratropium-albuterol, ondansetron  DVT prophylaxis: SCDs Code Status: Full code Family Communication: No family present at bedside Disposition Plan: Home when ready   Consultants:   GI  Procedures:   Flex sig 3/17  Antimicrobials:  None  Objective: Vitals:   04/18/18 0813 04/18/18 0817 04/18/18 1112 04/18/18 1141  BP:   (!) 116/49 (!) 115/52  Pulse:   (!) 41 (!) 50  Resp:   20 20  Temp:   98.7 F (37.1 C) 98.6 F (37 C)  TempSrc:   Oral Oral  SpO2: 93% 93% 96% 96%  Weight:      Height:        Intake/Output Summary (Last 24 hours) at 04/18/2018 1301 Last data filed at 04/18/2018 0924 Gross per 24 hour  Intake 1040 ml  Output -  Net 1040 ml   Filed Weights   04/16/18 0100 04/17/18 0755 04/18/18 0500  Weight: 68.3 kg 68.3 kg 68 kg    Examination:  Constitutional: NAD Eyes: PERRL, lids and conjunctivae normal ENMT: Mucous membranes are moist. No oropharyngeal exudates Neck: normal, supple, no masses, no thyromegaly Respiratory: clear to auscultation bilaterally, no wheezing, no crackles. Normal respiratory effort. No accessory muscle use.  Cardiovascular: Regular rate  and rhythm, no murmurs / rubs / gallops.  1+ pitting LE edema Abdomen: no tenderness. Bowel sounds positive.  Musculoskeletal: no clubbing / cyanosis.  Skin: no rashes Neurologic: non focal    Data Reviewed: I have independently reviewed following labs and imaging studies   CBC: Recent Labs  Lab 04/15/18 1046 04/15/18 1851 04/16/18 0241 04/17/18 0548 04/17/18 1106 04/18/18 0554  WBC 9.0  --  7.1 7.7 8.0 8.2  NEUTROABS 4.2  --   --   --   --   --   HGB 10.0* 9.1* 8.5* 7.2* 7.0* 6.8*  HCT 33.0* 30.0* 29.3* 24.0* 23.5* 23.3*  MCV 96.5  --  98.3 98.0 100.4* 101.3*  PLT 243  --  214 218 179 379   Basic Metabolic Panel: Recent Labs  Lab 04/15/18 1046 04/16/18 0241 04/17/18 0548  NA 135 136 138  K 4.3 4.1 4.2  CL 94* 98 104  CO2 30 27 26   GLUCOSE 121* 103* 126*  BUN 41* 36* 26*  CREATININE 0.93 0.80 0.76  CALCIUM 8.9 8.4* 8.4*   GFR: Estimated Creatinine Clearance: 67.3 mL/min (by C-G formula based on SCr of 0.76 mg/dL). Liver Function Tests: Recent Labs  Lab 04/15/18 1046  AST 24  ALT 13  ALKPHOS 129*  BILITOT 0.7  PROT 7.1  ALBUMIN 3.2*   Recent Labs  Lab 04/15/18 1046  LIPASE 54*   No results for input(s): AMMONIA in the last 168 hours. Coagulation Profile: No results for input(s): INR, PROTIME in the last 168 hours. Cardiac Enzymes: No results for input(s): CKTOTAL, CKMB, CKMBINDEX, TROPONINI in the last 168 hours. BNP (last 3 results) No results for input(s): PROBNP in the last 8760 hours. HbA1C: No results for input(s): HGBA1C in the last 72 hours. CBG: Recent Labs  Lab 04/17/18 2142 04/18/18 0031 04/18/18 0452 04/18/18 0734 04/18/18 1201  GLUCAP 110* 113* 112* 128* 163*   Lipid Profile: No results for input(s): CHOL, HDL, LDLCALC, TRIG, CHOLHDL, LDLDIRECT in the last 72 hours. Thyroid Function Tests: No results for input(s): TSH, T4TOTAL, FREET4, T3FREE, THYROIDAB in the last 72 hours. Anemia Panel: No results for input(s): VITAMINB12,  FOLATE, FERRITIN, TIBC, IRON, RETICCTPCT in the last 72 hours. Urine analysis:    Component Value Date/Time   COLORURINE YELLOW 04/15/2018 1047   APPEARANCEUR CLEAR 04/15/2018 1047   LABSPEC 1.017 04/15/2018 1047   PHURINE 5.0 04/15/2018 1047   GLUCOSEU NEGATIVE 04/15/2018 1047   HGBUR MODERATE (A) 04/15/2018 1047   BILIRUBINUR NEGATIVE 04/15/2018 1047   KETONESUR 5 (A) 04/15/2018 1047   PROTEINUR NEGATIVE 04/15/2018 1047   UROBILINOGEN 0.2 09/09/2012 1501   NITRITE NEGATIVE 04/15/2018 1047   LEUKOCYTESUR NEGATIVE 04/15/2018 1047   Sepsis Labs: Invalid input(s): PROCALCITONIN, LACTICIDVEN  Recent Results (from the past 240 hour(s))  Urine Culture     Status: Abnormal   Collection Time: 04/15/18 10:47 AM  Result Value Ref Range Status   Specimen Description   Final    URINE, RANDOM Performed at [facility], [place]  [address] [place], [place] [address]    Special Requests   Final    NONE Performed at [facility], [place] [address] [place], [place] [address]    Culture (A)  Final    <10,000 COLONIES/mL INSIGNIFICANT GROWTH Performed at [facility] [address], [place], [place] [address]    Report Status 04/16/2018 FINAL  Final  MRSA PCR Screening     Status: Abnormal   Collection Time: 04/15/18  9:36 PM  Result Value Ref Range Status   MRSA by PCR POSITIVE (A) NEGATIVE Final    Comment:        The GeneXpert MRSA Assay (FDA approved for NASAL specimens only), is one component of a comprehensive MRSA colonization surveillance program. It is not intended to diagnose MRSA infection nor to guide or monitor treatment for MRSA infections. RESULT CALLED TO, READ BACK BY AND VERIFIED WITH: CAUDLE,E RN @0042  ON 04/16/2018 JACKSON,K Performed at [facility], [place] [address], [place], [place] [address]       Radiology Studies: Dg Abd Portable 1v  Result Date: 04/17/2018 CLINICAL DATA:  85 year old male with  hematochezia. Plain film to localize clips placed at sigmoidoscopy. Subsequent encounter. EXAM: PORTABLE ABDOMEN - 1 VIEW COMPARISON:  03/08/2018 plain film exam.  04/15/2018 CT. FINDINGS: Prior cholecystectomy accounting for clips in the right upper quadrant. New from prior examination are 5 metallic objects in the region of the cecum/ascending colon. Gastrostomy tube in place. Nonspecific bowel gas pattern. Can not assess for free air on supine view. Right pleural effusion/pleural thickening. Cardiomegaly. AICD in place. Severe right hip joint degenerative changes. Vascular calcifications. IMPRESSION: New from prior examination are 5 metallic objects in the region of the cecum/ascending colon. Please see above. Electronically Signed   By: Genia Del M.D.   On: 04/17/2018 11:42    Marzetta Board, MD, PhD [facility]  Contact via  [email or web address]  [place] P: [phone]  F: [phone]

## 2018-04-18 NOTE — Progress Notes (Addendum)
```````````````````````    [facility] Progress Note  CC:  Rectal bleeding   Subjective: He complains of itchiness to his upper back, no abdominal pain   Objective:  S/P Flexible Sigmoidoscopy  04/18/2018:  A single (solitary) eight mm ulcer was found at the splenic flexure. Oozing was present. Stigmata of recent bleeding were present. To stop active bleeding, three hemostatic clips were successful and two hemostatic clips were unsuccessfully placed (MR conditional). 3 cc total 1:10K EPI also injected after clips. There was no bleeding at the end of the procedure. Multiple diverticula were found in the sigmoid colon. Red blood was found in the rectum, in the sigmoid colon, in the descending colon and at the splenic flexure.  Vital signs in last 24 hours: Temp:  [97.9 F (36.6 C)-98.7 F (37.1 C)] 98.2 F (36.8 C) (03/18 0432) Pulse Rate:  [56-72] 70 (03/18 0432) Resp:  [13-23] 19 (03/18 0432) BP: (111-142)/(49-56) 118/53 (03/18 0432) SpO2:  [91 %-100 %] 92 % (03/18 0432) Weight:  [68 kg] 68 kg (03/18 0500) Last BM Date: 04/17/18 General:   Alert in NAD Eyes: PERRLA, sclera nonicteric  Heart: irregular rhythm, no murmur Pulm:lungs clear  Abdomen:  Soft, flat, PEG tube, LUQ scar, LLQ with scar/ICD  Extremities:  LEs with erythema and 1+ edema bilaterally  Neurologic:  Alert and  oriented to name and hospital Psych:  Alert and cooperative. Normal mood and affect. Skin: back without rash  Intake/Output from previous day: 03/17 0701 - 03/18 0700 In: 1407.8 [I.V.:453.8; NG/GT:954] Out: -  Intake/Output this shift: No intake/output data recorded.  Lab Results: Recent Labs    04/17/18 0548 04/17/18 1106 04/18/18 0554  WBC 7.7 8.0 8.2  HGB 7.2* 7.0* 6.8*  HCT 24.0* 23.5* 23.3*  PLT 218 179 192   BMET Recent Labs    04/15/18 1046 04/16/18 0241 04/17/18 0548  NA 135 136 138  K 4.3 4.1 4.2  CL 94* 98 104  CO2 30 27 26   GLUCOSE 121* 103* 126*  BUN 41*  36* 26*  CREATININE 0.93 0.80 0.76  CALCIUM 8.9 8.4* 8.4*   LFT Recent Labs    04/15/18 1046  PROT 7.1  ALBUMIN 3.2*  AST 24  ALT 13  ALKPHOS 129*  BILITOT 0.7   Dg Abd Portable 1v  Result Date: 04/17/2018 CLINICAL DATA:  85 year old male with hematochezia. Plain film to localize clips placed at sigmoidoscopy. Subsequent encounter. EXAM: PORTABLE ABDOMEN - 1 VIEW COMPARISON:  03/08/2018 plain film exam.  04/15/2018 CT. FINDINGS: Prior cholecystectomy accounting for clips in the right upper quadrant. New from prior examination are 5 metallic objects in the region of the cecum/ascending colon. Gastrostomy tube in place. Nonspecific bowel gas pattern. Can not assess for free air on supine view. Right pleural effusion/pleural thickening. Cardiomegaly. AICD in place. Severe right hip joint degenerative changes. Vascular calcifications. IMPRESSION: New from prior examination are 5 metallic objects in the region of the cecum/ascending colon. Please see above. Electronically Signed   By: Genia Del M.D.   On: 04/17/2018 11:42    Assessment / Plan: 1. 85 y.o male with hematochezia,  hx of colon angiectasias 2013, solitary rectal ulcer syndrome 01/2017. S/P flexible sigmoidoscopy 04/17/2018 identified A single (solitary) eight mm ulcer at the splenic flexure actively oozing blood, 3 clips placed and epi injected for hemostasis. H/H dropped overnight, Hg 6.8 <<7.0. HCT 23.3 <<23.5. 1 unit of PRBCs ordered. -repeat H/H post transfusion -monitor for further rectal bleeding  -if future repeat flex  sig/colonoscopy needed would prep through peg due to poor prep with Fleet enema utilized for 04/17/2018 flex sig  2. HX of dysphagia which required PEG Tube -continue tube feedings -swallow study with speech therapist per Dr. Lyndel Safe   3. Hx of CAD/CABG/ICD/Afib         LOS: 3 days   Noralyn Pick  04/18/2018, 8:03 AM   Attending physician's note   I have taken an interval history,  reviewed the chart and examined the patient. I agree with the Advanced Practitioner's note, imp and recommendations.   85 year old with H/O colonic AVMs/solitary rectal ulcer syndrome with LGI bleed, s/p FS yesterday showing 8 mm ulcer at splenic flexure (appears benign) with active oozing s/p endoscopic therapy (epi+clips).  No further bleeding. Hb did drop to 6.8 s/p 1U prbc today.  Has PEG tube (not using for feeds).  Can obtain bedside swallowing evaluation before starting p.o. diet to r/o aspiration.  Plan: -If no rebleeding, advance diet (if OK with speech) and anticipate D/C in a.m. -If any active bleeding, prep through the PEG and repeat colon in AM. -Trend Hb. -[facility] GI clinic as an outpatient.   D/w hospitalist service.  Carmell Austria, MD

## 2018-04-18 NOTE — Evaluation (Signed)
Clinical/Bedside Swallow Evaluation Patient Details  Name: [name] MRN: [id number] Date of Birth: 12/12/1932  Today's Date: 04/18/2018 Time: SLP Start Time (ACUTE ONLY): 1310 SLP Stop Time (ACUTE ONLY): 1335 SLP Time Calculation (min) (ACUTE ONLY): 25 min  Past Medical History:  Past Medical History:  Diagnosis Date  . Abnormal thyroid scan    Abnormal thyroid imaging studies from 11/09/2010, status post ultrasound guided fine needle aspiration of the dominant left inferior thyroid nodule on 12/15/2010. Cytology report showed rare follicular epithelial cells and hemosiderin laden macrophages.  . Adenomatous colon polyp   . AICD (automatic cardioverter/defibrillator) present    a. fx lead; a. s/p lead extraction 03/02/17  . Arthritis    "all over"  . Asthma   . Atrial fibrillation ([place])    on chronic Coumadin; stopped July 2013 due to subdural hematomas  . CHF (congestive heart failure) (HCC)    EF 35-40% s/p most recent ICD generator change-out with Medtronic dual-chamber ICD 05/20/11 with explantation of previous abdominally-implanted device  . Chronic systolic heart failure- EF 35-40% 12/30/2009    Ejection fraction of 35-40% with an ischemic cardiomyopathy.  . Coronary artery disease    s/p CABG 1983 and PCI/stent 2004.   . Diabetes mellitus    diet controlled  . Diverticulosis   . Dyslipidemia   . Enteritis   . Erythrocytosis   . GERD (gastroesophageal reflux disease)   . Hepatic steatosis   . Hypertension   . Ischemic cardiomyopathy    WITH CHF  . Monocytosis 04/17/2013  . Myocardial infarction ([place]) 1983; ~ 1990  . Pleural effusion    right  . Pneumonia August 2013  . Portal hypertensive gastropathy ([place])   . Rectal ulcer   . Renal calculi   . Subdural hematoma [facility]) July 2013   Anticoagulation stopped.   . SunDown syndrome   . VT (ventricular tachycardia) ([place])    Past Surgical History:  Past Surgical History:  Procedure Laterality Date  .  CHOLECYSTECTOMY    . COLONOSCOPY  07/07/2011   Procedure: COLONOSCOPY;  Surgeon: Jerene Bears, MD;  Location: [facility];  Service: Gastroenterology;  Laterality: N/A;  . CORONARY ANGIOPLASTY WITH STENT PLACEMENT  2004   Tandem Cypher stents LAD  . CORONARY ARTERY BYPASS GRAFT  1983   SVG-mLAD  . ESOPHAGOGASTRODUODENOSCOPY  02/11/2011   Procedure: ESOPHAGOGASTRODUODENOSCOPY (EGD);  Surgeon: Beryle Beams, MD;  Location: [facility];  Service: Endoscopy;  Laterality: N/A;  . FLEXIBLE SIGMOIDOSCOPY N/A 02/08/2017   Procedure: FLEXIBLE SIGMOIDOSCOPY;  Surgeon: Irene Shipper, MD;  Location: [facility];  Service: Endoscopy;  Laterality: N/A;  . FLEXIBLE SIGMOIDOSCOPY N/A 04/17/2018   Procedure: FLEXIBLE SIGMOIDOSCOPY;  Surgeon: Gatha Mayer, MD;  Location: [facility];  Service: Gastroenterology;  Laterality: N/A;  . HEMOSTASIS CLIP PLACEMENT  04/17/2018   Procedure: HEMOSTASIS CLIP PLACEMENT;  Surgeon: Gatha Mayer, MD;  Location: [facility];  Service: Gastroenterology;;  . ICD GENERATOR CHANGEOUT N/A 04/08/2016   Procedure: ICD Generator Changeout;  Surgeon: Evans Lance, MD;  Location: [place] [facility];  Service: Cardiovascular;  Laterality: N/A;  . ICD LEAD REMOVAL N/A 03/02/2017   Procedure: ICD LEAD REMOVAL;  Surgeon: Evans Lance, MD;  Location: [place];  Service: Cardiovascular;  Laterality: N/A;  . IMPLANTABLE CARDIOVERTER DEFIBRILLATOR (ICD) GENERATOR CHANGE N/A 05/20/2011   Procedure: ICD GENERATOR CHANGE;  Surgeon: Evans Lance, MD;  Medtronic secure dual-chamber ICD serial number [id number]   . IR GASTROSTOMY TUBE MOD SED  04/19/2017  . KNEE ARTHROSCOPY     right; "just went in and scraped it"  . LAPAROTOMY N/A 09/30/2017   Procedure: LAPAROTOMY, LYSIS OF ADHESION;  Surgeon: Rolm Bookbinder, MD;  Location: [place];  Service: General;  Laterality: N/A;  . LEAD INSERTION N/A 03/02/2017   Procedure: LEAD INSERTION;  Surgeon: Evans Lance, MD;  Location: [place];  Service:  Cardiovascular;  Laterality: N/A;  . MASS EXCISION Right 05/10/2013   Procedure: EXCISION MASS RIGHT THUMB;  Surgeon: Wynonia Sours, MD;  Location: [place];  Service: Orthopedics;  Laterality: Right;  . PROXIMAL INTERPHALANGEAL FUSION (PIP) Right 05/10/2013   Procedure: DEBRIDEMENT PROXIMAL INTERPHALANGEAL FUSION (PIP);  Surgeon: Wynonia Sours, MD;  Location: [place];  Service: Orthopedics;  Laterality: Right;  . SUBMUCOSAL INJECTION  04/17/2018   Procedure: SUBMUCOSAL INJECTION;  Surgeon: Gatha Mayer, MD;  Location: [facility];  Service: Gastroenterology;;  . TONSILLECTOMY         HPI:  pt is an 85 yo male adm to [facility] with rectal bleeding.  PMH + for colon adenomatous, arthritis, DM, enteritis, GERD, pleural effusion-right, pna, portal hypertensive gastropathy, SDH 08/2011, asthma, Afib, CHF, MI.  Pt had pna in 03/2017 and MBS conducted with recommendation for NPO- after which pt received PEG.  He was seen as an op for MBS with goal of him having tube removed - recommendations at that time were for pt to continue po of nectar/thin with strict precautions. Advised pt's dysphagia prevented him from sustaining himself via po.     Assessment / Plan / Recommendation Clinical Impression  Pt familiar to this SLP from prior MBS 05/2017 when he was diagnosed with mild oral and severe pharyngeal dysphagia with gross residuals and aspiration of thin.  Today oral cavity is full of dried greenish, yellowish secretions.  SLP requested oral suction to be set up - assisted with this and helped pt with aggressive oral care.  Just with oral swabs, pt is coughing, hocking and expectorating.  Oral care required approximately 30 minutes.  Pt also was provided with single ice chips x3 which he would swish and hock and expectorate.  Suspect gross retained secretions in pharynx as well. Do not anticipate pt's baseline mild oral and severe pharyngeal dysphagia dx 05/2017 to be improved given  his advancing age and current medical condition.        Aspiration Risk  Severe aspiration risk;Risk for inadequate nutrition/hydration    Diet Recommendation NPO;Ice chips PRN after oral care   Liquid Administration via: Spoon Medication Administration: Via alternative means    Other  Recommendations Oral Care Recommendations: Oral care QID   Follow up Recommendations Other (comment)      Frequency and Duration min 2x/week  2 weeks       Prognosis Prognosis for Safe Diet Advancement: Fair Barriers to Reach Goals: Severity of deficits      Swallow Study   General Date of Onset: 04/18/18 HPI: pt is an 85 yo male adm to [facility] with rectal bleeding.  PMH + for colon adenomatous, arthritis, DM, enteritis, GERD, pleural effusion-right, pna, portal hypertensive gastropathy, SDH 08/2011, asthma, Afib, CHF, MI.  Pt had pna in 03/2017 and MBS conducted with recommendation for NPO- after which pt received PEG.  He was seen as an op for MBS with goal of him having tube removed - recommendations at that time were for pt to continue po of nectar/thin with strict precautions. Advised pt's dysphagia prevented  him from sustaining himself via po.   Type of Study: Bedside Swallow Evaluation Diet Prior to this Study: NPO Temperature Spikes Noted: No Respiratory Status: Room air History of Recent Intubation: No Behavior/Cognition: Alert Oral Cavity Assessment: Dried secretions;Other (comment)(soft palate coated with dry secretions - ) Oral Care Completed by SLP: Yes(aggressive oral care) Oral Cavity - Dentition: Edentulous Vision: Functional for self-feeding Self-Feeding Abilities: Able to feed self Patient Positioning: Upright in chair Baseline Vocal Quality: Suspected CN X (Vagus) involvement Volitional Cough: Weak Volitional Swallow: Unable to elicit    Oral/Motor/Sensory Function Overall Oral Motor/Sensory Function: Generalized oral weakness   Ice Chips Ice chips: Impaired Presentation:  Spoon Oral Phase Functional Implications: Other (comment) Pharyngeal Phase Impairments: Suspected delayed Swallow;Other (comments) Other Comments: immediate hocking and expectoration of thinned secretions   Thin Liquid Thin Liquid: Impaired Presentation: Spoon Pharyngeal  Phase Impairments: Other (comments);Multiple swallows Other Comments: immediate hocking and expectoration of thinned secretions    Nectar Thick Nectar Thick Liquid: Not tested   Honey Thick Honey Thick Liquid: Not tested   Puree Puree: Not tested   Solid     Solid: Not tested      Macario Golds 04/18/2018,5:49 PM  Luanna Salk, [facility] SLP [place] Pager [phone] Office [phone]

## 2018-04-18 NOTE — Progress Notes (Signed)
CRITICAL VALUE ALERT  Critical Value:  hgb 6.8  Date & Time Notied:  04/18/2018 0610   Provider Notified: On call Baltazar Najjar   Orders Received/Actions taken: See orders, if any taken

## 2018-04-19 DIAGNOSIS — Z431 Encounter for attention to gastrostomy: Secondary | ICD-10-CM

## 2018-04-19 LAB — CBC
HCT: 26.1 % — ABNORMAL LOW (ref 39.0–52.0)
Hemoglobin: 7.9 g/dL — ABNORMAL LOW (ref 13.0–17.0)
MCH: 29.7 pg (ref 26.0–34.0)
MCHC: 30.3 g/dL (ref 30.0–36.0)
MCV: 98.1 fL (ref 80.0–100.0)
Platelets: 172 10*3/uL (ref 150–400)
RBC: 2.66 MIL/uL — ABNORMAL LOW (ref 4.22–5.81)
RDW: 17.3 % — ABNORMAL HIGH (ref 11.5–15.5)
WBC: 7.7 10*3/uL (ref 4.0–10.5)
nRBC: 0 % (ref 0.0–0.2)

## 2018-04-19 LAB — TYPE AND SCREEN
ABO/RH(D): O POS
Antibody Screen: NEGATIVE
Unit division: 0

## 2018-04-19 LAB — GLUCOSE, CAPILLARY
Glucose-Capillary: 103 mg/dL — ABNORMAL HIGH (ref 70–99)
Glucose-Capillary: 109 mg/dL — ABNORMAL HIGH (ref 70–99)
Glucose-Capillary: 89 mg/dL (ref 70–99)

## 2018-04-19 LAB — BPAM RBC
BLOOD PRODUCT EXPIRATION DATE: 202004112359
ISSUE DATE / TIME: 202003181109
Unit Type and Rh: 5100

## 2018-04-19 NOTE — Progress Notes (Signed)
Called to assess clogged G-tube, originally placed 03/2017.  Tube connected to lopez 3 way valve. Disconnect and flushed valve thoroughly of debris. The successfully declogged tubing with declogging tool. Flushed drain thoroughly with saline and reconnected 3 -way valve.  Also slightly loosened external bumper as it appeared to be a little too snug.  G-tube should be fine for use now. Left declogging tools in pt room if needed again. Can call IR with issues.   Ascencion Dike PA-C Interventional Radiology 04/19/2018 10:49 AM

## 2018-04-19 NOTE — Progress Notes (Addendum)
[facility] Progress Note  CC:  Rectal bleeding   Subjective: He complains of mild stomach pain today. He is NPO except ice chips and mouth care. No nausea or vomiting. Receiving TF per PEG. Back is less itchy today.  Objective:  Vital signs in last 24 hours: Temp:  [98 F (36.7 C)-98.7 F (37.1 C)] 98 F (36.7 C) (03/19 0420) Pulse Rate:  [41-84] 63 (03/19 0420) Resp:  [16-20] 16 (03/19 0420) BP: (108-122)/(49-70) 113/70 (03/19 0420) SpO2:  [93 %-97 %] 95 % (03/19 0420) Weight:  [70.3 kg] 70.3 kg (03/19 0420) Last BM Date: 04/17/18 General:   Alert, sitting up in bed in NAD Eyes: PERRLA, sclera nonicteric  Heart:  Regular rate and rhythm; no murmurs Pulm: Clear throughout diminished in bases  Abdomen: soft, nondistended, nontender, + BS x 4 quads, PEG tube intact with granulation tissue surrounding PEG insertion site without exudate, AICD palpated to the  LLQ.  Extremities:  Without edema. Neurologic:  Alert and  oriented x4;  grossly normal neurologically. Psych:  Alert and cooperative. Normal mood and affect.  Intake/Output from previous day: 03/18 0701 - 03/19 0700 In: 315 [Blood:315] Out: -  Intake/Output this shift: No intake/output data recorded.  Lab Results: Recent Labs    04/17/18 1106 04/18/18 0554 04/18/18 1643 04/19/18 0534  WBC 8.0 8.2  --  7.7  HGB 7.0* 6.8* 7.7* 7.9*  HCT 23.5* 23.3* 25.6* 26.1*  PLT 179 192  --  172   BMET Recent Labs    04/17/18 0548  NA 138  K 4.2  CL 104  CO2 26  GLUCOSE 126*  BUN 26*  CREATININE 0.76  CALCIUM 8.4*    Dg Abd Portable 1v  Result Date: 04/17/2018 CLINICAL DATA:  85 year old male with hematochezia. Plain film to localize clips placed at sigmoidoscopy. Subsequent encounter. EXAM: PORTABLE ABDOMEN - 1 VIEW COMPARISON:  03/08/2018 plain film exam.  04/15/2018 CT. FINDINGS: Prior cholecystectomy accounting for clips in the right upper quadrant. New from prior examination are 5 metallic  objects in the region of the cecum/ascending colon. Gastrostomy tube in place. Nonspecific bowel gas pattern. Can not assess for free air on supine view. Right pleural effusion/pleural thickening. Cardiomegaly. AICD in place. Severe right hip joint degenerative changes. Vascular calcifications. IMPRESSION: New from prior examination are 5 metallic objects in the region of the cecum/ascending colon. Please see above. Electronically Signed   By: Genia Del M.D.   On: 04/17/2018 11:42    Assessment / Plan:   1. 85 y.o male with hematochezia,  hx of colon angiectasias 2013, solitary rectal ulcer syndrome 01/2017. S/P flexible sigmoidoscopy 04/17/2018 identified A single (solitary) eight mm ulcer at the splenic flexure actively oozing blood, 3 clips placed and epi injected for hemostasis. Hg dropped to 6.8. Received 1 unit of PRBCs 3/18. Today Hg 7.9. HCT 26.1. No further rectal bleeding reported post flex sigmoidoscopy. -monitor for further rectal bleeding  -if future repeat flex sig/colonoscopy needed would prep through peg due to poor prep with Fleet enema utilized for 04/17/2018 flex sig -transfuse for Hg< 7.0  2. Anemia secondary to # 1  3. Hx of dysphagia which required PEG Tube. SLP 05/2017 mild oral and severe pharyngeal dysphagia with gross residuals and aspiration of thin.  Repeat SLP 04/18/2018: SEVERE ASPIRATION RISK. -continue tube feedings -NPO  4. Epigastric pain today. Patient is on Omeprazole 40mg  per PEG -continue to monitor epigastric pain, may require IV PPI for a few days -monitor  TF residual, patency of PEG - may need EGD if epigastric pain worsens   5. Hx of CAD/CABG/ICD/Afib    LOS: 4 days   Noralyn Pick  04/19/2018, 8:01 AM     Attending Physician Note   I have taken an interval history, reviewed the chart and examined the patient. I agree with the Advanced Practitioner's note, impression and recommendations.   No recurrent hematochezia since flex sig  with epi inj, clips placed on 3/17.   Hgb increased to 7.9 post 1 U PRBC.   Tube feeds resumed for severe aspiration risk. G-tube flushed, unclogged by IR this morning.   GI signing off.   Outpatient GI follow up with Dr. Hilarie Fredrickson.    Lucio Edward, MD FACG [phone]

## 2018-04-19 NOTE — Progress Notes (Signed)
Physical Therapy Treatment Patient Details Name: [name] MRN: [id number] DOB: 03/31/1932 Today's Date: 04/19/2018    History of Present Illness 85 yo male admitted to ED 3/15 with rectal bleeding over the course of the past few days. PMH includes AICD, afi, OA, CHF, CAD with history of MI with coronary angiography and CABG, DM, HTN, PEG placement, SDH, PE, cardiomyopathy.     PT Comments    Progressing with mobility. Continue to recommend SNF.    Follow Up Recommendations  SNF     Equipment Recommendations  None recommended by PT    Recommendations for Other Services       Precautions / Restrictions Precautions Precautions: Fall Precaution Comments: PEG tube Restrictions Weight Bearing Restrictions: No    Mobility  Bed Mobility Overal bed mobility: Needs Assistance Bed Mobility: Supine to Sit     Supine to sit: Min guard;HOB elevated     General bed mobility comments: Min guard for safety. Very increased time, use of bedrails   Transfers Overall transfer level: Needs assistance Equipment used: Rolling walker (2 wheeled) Transfers: Sit to/from Stand Sit to Stand: Min assist;From elevated surface         General transfer comment: VCs safety, hand palcement. Increased time. Unsteady.   Ambulation/Gait Ambulation/Gait assistance: Min assist Gait Distance (Feet): 50 Feet Assistive device: Rolling walker (2 wheeled) Gait Pattern/deviations: Step-through pattern;Decreased stride length     General Gait Details: Intermittent assist to  steady. slow gait speed.    Stairs             Wheelchair Mobility    Modified Rankin (Stroke Patients Only)       Balance Overall balance assessment: Needs assistance         Standing balance support: Bilateral upper extremity supported Standing balance-Leahy Scale: Poor                              Cognition Arousal/Alertness: Awake/alert Behavior During Therapy: WFL for tasks  assessed/performed Overall Cognitive Status: Within Functional Limits for tasks assessed                                        Exercises      General Comments        Pertinent Vitals/Pain Pain Assessment: Faces Faces Pain Scale: Hurts even more Pain Location: R knee with ambulation Pain Descriptors / Indicators: Aching;Sore Pain Intervention(s): Monitored during session;Repositioned    Home Living                      Prior Function            PT Goals (current goals can now be found in the care plan section) Progress towards PT goals: Progressing toward goals    Frequency    Min 2X/week      PT Plan Current plan remains appropriate    Co-evaluation              AM-PAC PT "6 Clicks" Mobility   Outcome Measure  Help needed turning from your back to your side while in a flat bed without using bedrails?: A Little Help needed moving from lying on your back to sitting on the side of a flat bed without using bedrails?: A Little Help needed moving to and from a bed to a chair (including  a wheelchair)?: A Little Help needed standing up from a chair using your arms (e.g., wheelchair or bedside chair)?: A Little Help needed to walk in hospital room?: A Little Help needed climbing 3-5 steps with a railing? : A Little 6 Click Score: 18    End of Session Equipment Utilized During Treatment: Gait belt Activity Tolerance: Patient tolerated treatment well Patient left: in chair;with call bell/phone within reach;with chair alarm set   PT Visit Diagnosis: Other abnormalities of gait and mobility (R26.89);Muscle weakness (generalized) (M62.81)     Time: 6837-2902 PT Time Calculation (min) (ACUTE ONLY): 21 min  Charges:  $Gait Training: 8-22 mins                        Weston Anna, PT Acute Rehabilitation Services Pager: [phone] Office: [phone]

## 2018-04-19 NOTE — Progress Notes (Signed)
SLP Cancellation Note  Patient Details Name: [name] MRN: [id number] DOB: 1932-09-14   Cancelled treatment:       Reason Eval/Treat Not Completed: Other (comment)(pt sound asleep at this time and is scheduled for dc today, recommend follow up SlP at next venue of care)   Macario Golds 04/19/2018, 12:14 PM  Luanna Salk, [facility] SLP [place] Pager [phone] Office [phone]

## 2018-04-19 NOTE — Discharge Summary (Signed)
Physician Discharge Summary  [name] DTO:[id number] DOB: 1932-03-23 DOA: 04/15/2018  PCP: [name], MD  Admit date: 04/15/2018 Discharge date: 04/19/2018  Admitted From: SNF Disposition:  SNF  Recommendations for Outpatient Follow-up:  1. Follow up with PCP in 1-2 weeks 2. Please obtain BMP/CBC in one week  Home Health: none  Equipment/Devices: none   Discharge Condition: stable CODE STATUS: Full code Diet recommendation: NPO, via G tube   HPI: Per admitting MD, [name] is a 85 y.o. male with medical history significant for chronic A. Fib post AICD, history of PE not on oral anticoagulation, dysphagia status post PEG tube placement, chronic systolic CHF, cardiomyopathy with LVEF 35 to 40%, subdural hematoma, chronic right pleural effusion who presented to [facility] ED with complaints of bright red blood per rectum x1 day.  Sudden onset yesterday evening.  Denies abdominal pain or nausea.  Denies dizziness, chest pain or dyspnea.  Denies constipation, straining, use of laxative or stool softener.  No other associated symptoms. ED Course: Upon presentation to the ED, vital signs remarkable for tachycardia bradycardia, has AICD.  Lab studies remarkable for elevated lipase 54, positive FOBT, drop in hemoglobin from 11 to 10.  CT abdomen and pelvis with contrast unrevealing for any acute intra-abdominal findings however showed moderate right pleural effusion and possible left lower lobe infiltrates.  To note patient denies fever, chills, cough, dyspnea, or any respiratory symptoms.  Hospital Course: Principal Problem Rectal bleed with acute blood loss anemia -patient was admitted to the hospital with bright red blood per rectum.  Gastroenterology was consulted and followed patient while hospitalized.  He is status post flexible sigmoidoscopy on 3/17 with findings of a solitary ulcer at the splenic flexure with active oozing status post clipping.  Following the procedure  clinical his bleeding has stopped, he has been having regular bowel movements without further evidence for blood loss.  His hemoglobin did trend down to 6.8 and received 1 unit of packed red blood cells with adequate improvement in his hemoglobin.  Active Problems Chronic A. fib, AICD in place, history of PE -Finished anticoagulation, currently not on any blood thinners, rate controlled on beta-blockers HTN -resume home medications Dysphagia status post PEG tube placement in 2019 -Resume tube feeding.  ER evaluated PEG tube on 3/19 and he was declogged and bumper loosened Severe protein calorie malnutrition -Continue PEG feeding Cronic systolic CHF -resume home medications   History of subdural hematoma -Avoid anticoagulants and antiplatelets, not an acute issue  Discharge Diagnoses:  Active Problems:   Rectal bleeding   Acute blood loss anemia   Elevated BUN   Hematochezia   Colonic ulcer     Discharge Instructions   Allergies as of 04/19/2018      Reactions   Tamsulosin Other (See Comments)   Dizziness, Made BP very low and weakness   Celebrex [celecoxib] Hives, Nausea And Vomiting, Other (See Comments)   Gi upset   Dronedarone Nausea And Vomiting, Other (See Comments)   GI upset, abdominal pain   Esomeprazole Magnesium Hives, Other (See Comments)   "don't really remember"   Digoxin Diarrhea, Other (See Comments)   May have caused some diarrhea   Hydrocodone-acetaminophen Other (See Comments)   Bad headache   Protonix [pantoprazole Sodium] Nausea And Vomiting, Other (See Comments)   Tolerates Dexilant      Medication List    TAKE these medications   acetaminophen 325 MG tablet Commonly known as:  TYLENOL Give 2 tablets via  PEG - tube every 6 hours for stomach pain   Advair Diskus 250-50 MCG/DOSE Aepb Generic drug:  Fluticasone-Salmeterol Inhale 1 puff into the lungs every 12 (twelve) hours.   Biotene Moisturizing Mouth Soln See admin instructions. Give 1 swab  by mouth every 4 hours   digoxin 0.25 MG tablet Commonly known as:  LANOXIN Place 0.25 mg into feeding tube daily. Hold for heart rate less than 60.   feeding supplement (JEVITY 1.5 CAL) Liqd Jevity 1.5 -  Give 237 ml every 4 hours per peg tube   free water Soln Place 120 mLs into feeding tube every 4 (four) hours. 120 ml H2o flushes via tube every 4 hours before and after meds, before initiating feeding or when there is an interruption of feeding to maintain tube patency.   furosemide 20 MG tablet Commonly known as:  LASIX Take 20 mg by mouth 2 (two) times daily. Start taking on:  April 21, 2018 What changed:  Another medication with the same name was removed. Continue taking this medication, and follow the directions you see here.   ipratropium 0.03 % nasal spray Commonly known as:  ATROVENT Place 1 spray into both nostrils 3 (three) times daily before meals.   ipratropium-albuterol 0.5-2.5 (3) MG/3ML Soln Commonly known as:  DUONEB Take 3 mLs by nebulization every 4 (four) hours as needed (For congestion.).   Melatonin 3 MG Tabs Take 3 mg by mouth at bedtime.   meloxicam 7.5 MG tablet Commonly known as:  MOBIC Take 7.5 mg by mouth daily. Via PEG-Tube   metoprolol tartrate 25 MG tablet Commonly known as:  LOPRESSOR Place 1 tablet (25 mg total) into feeding tube 2 (two) times daily.   nitroGLYCERIN 0.4 MG SL tablet Commonly known as:  NITROSTAT Place 1 tablet (0.4 mg total) under the tongue every 5 (five) minutes as needed for chest pain.   NON FORMULARY Regular diet - Pureed Texture, Nectar thickened Fluids consistency.   Patient may have water (thin) and pleasure foods (puree texture and nectar thick liquids upon request   omeprazole 40 MG capsule Commonly known as:  PRILOSEC Take 40 mg by mouth at bedtime. Via Peg Tube.   ondansetron 4 MG tablet Commonly known as:  ZOFRAN Place 4 mg into feeding tube every 6 (six) hours as needed for nausea or vomiting.    OXYGEN Inhale 2 L/min into the lungs daily as needed. To maintain sats less than 90%   polyethylene glycol packet Commonly known as:  MIRALAX / GLYCOLAX Place 17 g into feeding tube 2 (two) times daily.   potassium chloride 20 MEQ/15ML (10%) Soln Place 20 mEq into feeding tube daily.   Restasis 0.05 % ophthalmic emulsion Generic drug:  cycloSPORINE INSTILL 1 DROP INTO BOTH EYES TWICE A DAY What changed:  See the new instructions.   tiotropium 18 MCG inhalation capsule Commonly known as:  SPIRIVA Place 18 mcg into inhaler and inhale daily.      Consultations:  GI  Procedures/Studies:  Flexible sigmoidoscopy 2022-04-27   Ct Abdomen Pelvis W Contrast  Result Date: 04/15/2018 CLINICAL DATA:  85 year old male with acute abdominal and pelvic pain with distension. EXAM: CT ABDOMEN AND PELVIS WITH CONTRAST TECHNIQUE: Multidetector CT imaging of the abdomen and pelvis was performed using the standard protocol following bolus administration of intravenous contrast. CONTRAST:  163mL ISOVUE-300 IOPAMIDOL (ISOVUE-300) INJECTION 61% COMPARISON:  03/20/2018 and prior CTs FINDINGS: Lower chest: A moderate loculated RIGHT pleural effusion is unchanged. Cardiomegaly and pericardial patch again  identified. New LEFT LOWER lobe tree-in-bud opacities may represent infection or aspiration. Hepatobiliary: The liver is unremarkable. The patient is status post cholecystectomy. No biliary dilatation. Pancreas: Unremarkable Spleen: Unchanged Adrenals/Urinary Tract: Bilateral renal atrophy and nonobstructing 5 mm LEFT LOWER pole renal calculus again noted. The adrenal glands and bladder are unremarkable. Stomach/Bowel: Percutaneous gastrostomy tube again noted. There is no evidence of bowel obstruction, definite bowel wall thickening or inflammatory changes. Vascular/Lymphatic: Aortic atherosclerosis. No enlarged abdominal or pelvic lymph nodes. Reproductive: Prostate is unremarkable. Other: A small RIGHT inguinal  hernia containing a loop of small bowel again identified. No ascites, focal collection/abscess or pneumoperitoneum. Musculoskeletal: No acute or suspicious bony abnormalities. Degenerative changes in the lumbar spine and hips again noted. IMPRESSION: 1. New mild LEFT LOWER lobe tree-in-bud opacities likely representing infection or aspiration. 2. No acute abnormality or significant change within the abdomen or pelvis since 03/20/2018. 3. No significant change in moderate loculated RIGHT pleural effusion which may represent empyema. 4. Small RIGHT inguinal hernia containing small bowel. No small bowel obstruction or acute bowel abnormality. 5. Nonobstructing LEFT renal calculus 6. Cardiomegaly and Aortic Atherosclerosis (ICD10-I70.0). Electronically Signed   By: Margarette Canada M.D.   On: 04/15/2018 13:32   Dg Abd Portable 1v  Result Date: 04/17/2018 CLINICAL DATA:  85 year old male with hematochezia. Plain film to localize clips placed at sigmoidoscopy. Subsequent encounter. EXAM: PORTABLE ABDOMEN - 1 VIEW COMPARISON:  03/08/2018 plain film exam.  04/15/2018 CT. FINDINGS: Prior cholecystectomy accounting for clips in the right upper quadrant. New from prior examination are 5 metallic objects in the region of the cecum/ascending colon. Gastrostomy tube in place. Nonspecific bowel gas pattern. Can not assess for free air on supine view. Right pleural effusion/pleural thickening. Cardiomegaly. AICD in place. Severe right hip joint degenerative changes. Vascular calcifications. IMPRESSION: New from prior examination are 5 metallic objects in the region of the cecum/ascending colon. Please see above. Electronically Signed   By: Genia Del M.D.   On: 04/17/2018 11:42     Subjective: - no chest pain, shortness of breath, no abdominal pain, nausea or vomiting.   Discharge Exam: BP 113/70 (BP Location: Right Arm)   Pulse 63   Temp 98 F (36.7 C) (Oral)   Resp 16   Ht 6\' 1"  (1.854 m)   Wt 70.3 kg   SpO2 95%    BMI 20.45 kg/m   General: Pt is alert, awake, not in acute distress Cardiovascular: RRR, S1/S2 +, no rubs, no gallops Respiratory: CTA bilaterally, no wheezing, no rhonchi Abdominal: Soft, NT, ND, bowel sounds + Extremities: no edema, no cyanosis    The results of significant diagnostics from this hospitalization (including imaging, microbiology, ancillary and laboratory) are listed below for reference.     Microbiology: Recent Results (from the past 240 hour(s))  Urine Culture     Status: Abnormal   Collection Time: 04/15/18 10:47 AM  Result Value Ref Range Status   Specimen Description   Final    URINE, RANDOM Performed at [facility] [address], [place], [place] [address]    Special Requests   Final    NONE Performed at [facility], [place] [address] [place], [place] [address]    Culture (A)  Final    <10,000 COLONIES/mL INSIGNIFICANT GROWTH Performed at [facility] [address], [place], [place] [address]    Report Status 04/16/2018 FINAL  Final  MRSA PCR Screening     Status: Abnormal  Collection Time: 04/15/18  9:36 PM  Result Value Ref Range Status   MRSA by PCR POSITIVE (A) NEGATIVE Final    Comment:        The GeneXpert MRSA Assay (FDA approved for NASAL specimens only), is one component of a comprehensive MRSA colonization surveillance program. It is not intended to diagnose MRSA infection nor to guide or monitor treatment for MRSA infections. RESULT CALLED TO, READ BACK BY AND VERIFIED WITH: CAUDLE,E RN @0042  ON 04/16/2018 JACKSON,K Performed at [facility], [place] [address], [place], [place] [address]      Labs: BNP (last 3 results) No results for input(s): BNP in the last 8760 hours. Basic Metabolic Panel: Recent Labs  Lab 04/15/18 1046 04/16/18 0241 04/17/18 0548  NA 135 136 138  K 4.3 4.1 4.2  CL 94* 98 104  CO2 30 27 26   GLUCOSE 121* 103* 126*  BUN 41* 36* 26*   CREATININE 0.93 0.80 0.76  CALCIUM 8.9 8.4* 8.4*   Liver Function Tests: Recent Labs  Lab 04/15/18 1046  AST 24  ALT 13  ALKPHOS 129*  BILITOT 0.7  PROT 7.1  ALBUMIN 3.2*   Recent Labs  Lab 04/15/18 1046  LIPASE 54*   No results for input(s): AMMONIA in the last 168 hours. CBC: Recent Labs  Lab 04/15/18 1046  04/16/18 0241 04/17/18 0548 04/17/18 1106 04/18/18 0554 04/18/18 1643 04/19/18 0534  WBC 9.0  --  7.1 7.7 8.0 8.2  --  7.7  NEUTROABS 4.2  --   --   --   --   --   --   --   HGB 10.0*   < > 8.5* 7.2* 7.0* 6.8* 7.7* 7.9*  HCT 33.0*   < > 29.3* 24.0* 23.5* 23.3* 25.6* 26.1*  MCV 96.5  --  98.3 98.0 100.4* 101.3*  --  98.1  PLT 243  --  214 218 179 192  --  172   < > = values in this interval not displayed.   Cardiac Enzymes: No results for input(s): CKTOTAL, CKMB, CKMBINDEX, TROPONINI in the last 168 hours. BNP: Invalid input(s): POCBNP CBG: Recent Labs  Lab 04/18/18 1548 04/18/18 2044 04/19/18 0015 04/19/18 0421 04/19/18 0754  GLUCAP 132* 121* 103* 109* 89   D-Dimer No results for input(s): DDIMER in the last 72 hours. Hgb A1c No results for input(s): HGBA1C in the last 72 hours. Lipid Profile No results for input(s): CHOL, HDL, LDLCALC, TRIG, CHOLHDL, LDLDIRECT in the last 72 hours. Thyroid function studies No results for input(s): TSH, T4TOTAL, T3FREE, THYROIDAB in the last 72 hours.  Invalid input(s): FREET3 Anemia work up No results for input(s): VITAMINB12, FOLATE, FERRITIN, TIBC, IRON, RETICCTPCT in the last 72 hours. Urinalysis    Component Value Date/Time   COLORURINE YELLOW 04/15/2018 1047   APPEARANCEUR CLEAR 04/15/2018 1047   LABSPEC 1.017 04/15/2018 1047   PHURINE 5.0 04/15/2018 1047   GLUCOSEU NEGATIVE 04/15/2018 1047   HGBUR MODERATE (A) 04/15/2018 1047   BILIRUBINUR NEGATIVE 04/15/2018 1047   KETONESUR 5 (A) 04/15/2018 1047   PROTEINUR NEGATIVE 04/15/2018 1047   UROBILINOGEN 0.2 09/09/2012 1501   NITRITE NEGATIVE  04/15/2018 1047   LEUKOCYTESUR NEGATIVE 04/15/2018 1047   Sepsis Labs Invalid input(s): PROCALCITONIN,  WBC,  LACTICIDVEN  FURTHER DISCHARGE INSTRUCTIONS:   Get Medicines reviewed and adjusted: Please take all your medications with you for your next visit with your Primary MD   Laboratory/radiological data: Please request your Primary MD to go over  all hospital tests and procedure/radiological results at the follow up, please ask your Primary MD to get all Hospital records sent to his/her office.   In some cases, they will be blood work, cultures and biopsy results pending at the time of your discharge. Please request that your primary care M.D. goes through all the records of your hospital data and follows up on these results.   Also Note the following: If you experience worsening of your admission symptoms, develop shortness of breath, life threatening emergency, suicidal or homicidal thoughts you must seek medical attention immediately by calling 911 or calling your MD immediately  if symptoms less severe.   You must read complete instructions/literature along with all the possible adverse reactions/side effects for all the Medicines you take and that have been prescribed to you. Take any new Medicines after you have completely understood and accpet all the possible adverse reactions/side effects.    Do not drive when taking Pain medications or sleeping medications (Benzodaizepines)   Do not take more than prescribed Pain, Sleep and Anxiety Medications. It is not advisable to combine anxiety,sleep and pain medications without talking with your primary care practitioner   Special Instructions: If you have smoked or chewed Tobacco  in the last 2 yrs please stop smoking, stop any regular Alcohol  and or any Recreational drug use.   Wear Seat belts while driving.   Please note: You were cared for by a hospitalist during your hospital stay. Once you are discharged, your primary care  physician will handle any further medical issues. Please note that NO REFILLS for any discharge medications will be authorized once you are discharged, as it is imperative that you return to your primary care physician (or establish a relationship with a primary care physician if you do not have one) for your post hospital discharge needs so that they can reassess your need for medications and monitor your lab values.  Time coordinating discharge: 40 minutes  SIGNED:  Marzetta Board, PA-S 04/19/2018, 11:34 AM

## 2018-04-19 NOTE — TOC Transition Note (Addendum)
Transition of Care [facility]) - CM/SW Discharge Note   Patient Details  Name: LAYTHAN HAYTER MRN: [id number] Date of Birth: Jun 04, 1932  [facility] CM/SW Contact:  Servando Snare, LCSW Phone Number: 04/19/2018, 12:38 PM   Clinical Narrative:   Patient to return to [facility] room 205  LCSW faxed dc docs to facility.   Patient to transport by [facility].  RN report # [phone]    Final next level of care: Skilled Nursing Facility Barriers to Discharge: Continued Medical Work up   Patient Goals and CMS Choice Patient states their goals for this hospitalization and ongoing recovery are:: pt wanting to return back to facility CMS Medicare.gov Compare Post Acute Care list provided to:: Other (Comment Required)(from Wandra Feinstein ltc)    Discharge Placement                       Discharge Plan and Services                          Social Determinants of Health (SDOH) Interventions     Readmission Risk Interventions No flowsheet data found.

## 2018-04-19 NOTE — TOC Transition Note (Signed)
Transition of Care [facility]) - CM/SW Discharge Note   Patient Details  Name: [name] MRN: [id number] Date of Birth: 1932/12/29  Transition of Care [facility]) CM/SW Contact:  Leeroy Cha, RN Phone Number: 04/19/2018, 2:32 PM   Clinical Narrative:    dcd to [facility] via [facility]   Final next level of care: [place] Barriers to Discharge: No Barriers Identified, Barriers Resolved   Patient Goals and CMS Choice Patient states their goals for this hospitalization and ongoing recovery are:: pt wanting to return back to facility CMS Medicare.gov Compare Post Acute Care list provided to:: Other (Comment Required)(from Wandra Feinstein ltc)    Discharge Placement                       Discharge Plan and Services In-house Referral: Clinical Social Work Discharge Planning Services: CM Consult                      Social Determinants of Health (SDOH) Interventions     Readmission Risk Interventions No flowsheet data found.

## 2018-04-20 ENCOUNTER — Telehealth: Payer: Self-pay | Admitting: *Deleted

## 2018-04-20 DIAGNOSIS — D62 Acute posthemorrhagic anemia: Secondary | ICD-10-CM | POA: Diagnosis not present

## 2018-04-20 DIAGNOSIS — R1310 Dysphagia, unspecified: Secondary | ICD-10-CM | POA: Diagnosis not present

## 2018-04-20 DIAGNOSIS — R1314 Dysphagia, pharyngoesophageal phase: Secondary | ICD-10-CM | POA: Diagnosis not present

## 2018-04-20 DIAGNOSIS — R2681 Unsteadiness on feet: Secondary | ICD-10-CM | POA: Diagnosis not present

## 2018-04-20 DIAGNOSIS — J90 Pleural effusion, not elsewhere classified: Secondary | ICD-10-CM | POA: Diagnosis not present

## 2018-04-20 DIAGNOSIS — K625 Hemorrhage of anus and rectum: Secondary | ICD-10-CM | POA: Diagnosis not present

## 2018-04-20 DIAGNOSIS — M6281 Muscle weakness (generalized): Secondary | ICD-10-CM | POA: Diagnosis not present

## 2018-04-20 DIAGNOSIS — D649 Anemia, unspecified: Secondary | ICD-10-CM | POA: Diagnosis not present

## 2018-04-20 DIAGNOSIS — Z8701 Personal history of pneumonia (recurrent): Secondary | ICD-10-CM | POA: Diagnosis not present

## 2018-04-20 DIAGNOSIS — R627 Adult failure to thrive: Secondary | ICD-10-CM | POA: Diagnosis not present

## 2018-04-20 DIAGNOSIS — K219 Gastro-esophageal reflux disease without esophagitis: Secondary | ICD-10-CM | POA: Diagnosis not present

## 2018-04-20 DIAGNOSIS — I482 Chronic atrial fibrillation, unspecified: Secondary | ICD-10-CM | POA: Diagnosis not present

## 2018-04-20 NOTE — Telephone Encounter (Signed)
RN attempted to contact patient for TCM. No answer and unable to leave a message.

## 2018-04-21 DIAGNOSIS — F4321 Adjustment disorder with depressed mood: Secondary | ICD-10-CM | POA: Diagnosis not present

## 2018-04-23 DIAGNOSIS — D649 Anemia, unspecified: Secondary | ICD-10-CM | POA: Diagnosis not present

## 2018-04-23 DIAGNOSIS — R1314 Dysphagia, pharyngoesophageal phase: Secondary | ICD-10-CM | POA: Diagnosis not present

## 2018-04-23 DIAGNOSIS — K219 Gastro-esophageal reflux disease without esophagitis: Secondary | ICD-10-CM | POA: Diagnosis not present

## 2018-04-23 DIAGNOSIS — R2681 Unsteadiness on feet: Secondary | ICD-10-CM | POA: Diagnosis not present

## 2018-04-23 DIAGNOSIS — M6281 Muscle weakness (generalized): Secondary | ICD-10-CM | POA: Diagnosis not present

## 2018-04-23 DIAGNOSIS — K625 Hemorrhage of anus and rectum: Secondary | ICD-10-CM | POA: Diagnosis not present

## 2018-04-24 DIAGNOSIS — R2681 Unsteadiness on feet: Secondary | ICD-10-CM | POA: Diagnosis not present

## 2018-04-24 DIAGNOSIS — D649 Anemia, unspecified: Secondary | ICD-10-CM | POA: Diagnosis not present

## 2018-04-24 DIAGNOSIS — K219 Gastro-esophageal reflux disease without esophagitis: Secondary | ICD-10-CM | POA: Diagnosis not present

## 2018-04-24 DIAGNOSIS — K625 Hemorrhage of anus and rectum: Secondary | ICD-10-CM | POA: Diagnosis not present

## 2018-04-24 DIAGNOSIS — M6281 Muscle weakness (generalized): Secondary | ICD-10-CM | POA: Diagnosis not present

## 2018-04-24 DIAGNOSIS — R1314 Dysphagia, pharyngoesophageal phase: Secondary | ICD-10-CM | POA: Diagnosis not present

## 2018-04-25 DIAGNOSIS — R1314 Dysphagia, pharyngoesophageal phase: Secondary | ICD-10-CM | POA: Diagnosis not present

## 2018-04-25 DIAGNOSIS — R2681 Unsteadiness on feet: Secondary | ICD-10-CM | POA: Diagnosis not present

## 2018-04-25 DIAGNOSIS — K625 Hemorrhage of anus and rectum: Secondary | ICD-10-CM | POA: Diagnosis not present

## 2018-04-25 DIAGNOSIS — M6281 Muscle weakness (generalized): Secondary | ICD-10-CM | POA: Diagnosis not present

## 2018-04-25 DIAGNOSIS — D649 Anemia, unspecified: Secondary | ICD-10-CM | POA: Diagnosis not present

## 2018-04-25 DIAGNOSIS — K219 Gastro-esophageal reflux disease without esophagitis: Secondary | ICD-10-CM | POA: Diagnosis not present

## 2018-04-26 DIAGNOSIS — D649 Anemia, unspecified: Secondary | ICD-10-CM | POA: Diagnosis not present

## 2018-04-26 DIAGNOSIS — K625 Hemorrhage of anus and rectum: Secondary | ICD-10-CM | POA: Diagnosis not present

## 2018-04-26 DIAGNOSIS — I2510 Atherosclerotic heart disease of native coronary artery without angina pectoris: Secondary | ICD-10-CM | POA: Diagnosis not present

## 2018-04-26 DIAGNOSIS — M6281 Muscle weakness (generalized): Secondary | ICD-10-CM | POA: Diagnosis not present

## 2018-04-26 DIAGNOSIS — R2681 Unsteadiness on feet: Secondary | ICD-10-CM | POA: Diagnosis not present

## 2018-04-26 DIAGNOSIS — R1314 Dysphagia, pharyngoesophageal phase: Secondary | ICD-10-CM | POA: Diagnosis not present

## 2018-04-26 DIAGNOSIS — K219 Gastro-esophageal reflux disease without esophagitis: Secondary | ICD-10-CM | POA: Diagnosis not present

## 2018-04-27 DIAGNOSIS — M6281 Muscle weakness (generalized): Secondary | ICD-10-CM | POA: Diagnosis not present

## 2018-04-27 DIAGNOSIS — D649 Anemia, unspecified: Secondary | ICD-10-CM | POA: Diagnosis not present

## 2018-04-27 DIAGNOSIS — K625 Hemorrhage of anus and rectum: Secondary | ICD-10-CM | POA: Diagnosis not present

## 2018-04-27 DIAGNOSIS — K219 Gastro-esophageal reflux disease without esophagitis: Secondary | ICD-10-CM | POA: Diagnosis not present

## 2018-04-27 DIAGNOSIS — R2681 Unsteadiness on feet: Secondary | ICD-10-CM | POA: Diagnosis not present

## 2018-04-27 DIAGNOSIS — R1314 Dysphagia, pharyngoesophageal phase: Secondary | ICD-10-CM | POA: Diagnosis not present

## 2018-04-29 DIAGNOSIS — R2681 Unsteadiness on feet: Secondary | ICD-10-CM | POA: Diagnosis not present

## 2018-04-29 DIAGNOSIS — K625 Hemorrhage of anus and rectum: Secondary | ICD-10-CM | POA: Diagnosis not present

## 2018-04-29 DIAGNOSIS — D649 Anemia, unspecified: Secondary | ICD-10-CM | POA: Diagnosis not present

## 2018-04-29 DIAGNOSIS — M6281 Muscle weakness (generalized): Secondary | ICD-10-CM | POA: Diagnosis not present

## 2018-04-29 DIAGNOSIS — R1314 Dysphagia, pharyngoesophageal phase: Secondary | ICD-10-CM | POA: Diagnosis not present

## 2018-04-29 DIAGNOSIS — K219 Gastro-esophageal reflux disease without esophagitis: Secondary | ICD-10-CM | POA: Diagnosis not present

## 2018-04-29 DIAGNOSIS — F4321 Adjustment disorder with depressed mood: Secondary | ICD-10-CM | POA: Diagnosis not present

## 2018-04-30 DIAGNOSIS — K625 Hemorrhage of anus and rectum: Secondary | ICD-10-CM | POA: Diagnosis not present

## 2018-04-30 DIAGNOSIS — D649 Anemia, unspecified: Secondary | ICD-10-CM | POA: Diagnosis not present

## 2018-04-30 DIAGNOSIS — M6281 Muscle weakness (generalized): Secondary | ICD-10-CM | POA: Diagnosis not present

## 2018-04-30 DIAGNOSIS — K219 Gastro-esophageal reflux disease without esophagitis: Secondary | ICD-10-CM | POA: Diagnosis not present

## 2018-04-30 DIAGNOSIS — R1314 Dysphagia, pharyngoesophageal phase: Secondary | ICD-10-CM | POA: Diagnosis not present

## 2018-04-30 DIAGNOSIS — R2681 Unsteadiness on feet: Secondary | ICD-10-CM | POA: Diagnosis not present

## 2018-05-01 DIAGNOSIS — R1314 Dysphagia, pharyngoesophageal phase: Secondary | ICD-10-CM | POA: Diagnosis not present

## 2018-05-01 DIAGNOSIS — K219 Gastro-esophageal reflux disease without esophagitis: Secondary | ICD-10-CM | POA: Diagnosis not present

## 2018-05-01 DIAGNOSIS — D649 Anemia, unspecified: Secondary | ICD-10-CM | POA: Diagnosis not present

## 2018-05-01 DIAGNOSIS — M6281 Muscle weakness (generalized): Secondary | ICD-10-CM | POA: Diagnosis not present

## 2018-05-01 DIAGNOSIS — K625 Hemorrhage of anus and rectum: Secondary | ICD-10-CM | POA: Diagnosis not present

## 2018-05-01 DIAGNOSIS — R2681 Unsteadiness on feet: Secondary | ICD-10-CM | POA: Diagnosis not present

## 2018-05-04 DIAGNOSIS — I2510 Atherosclerotic heart disease of native coronary artery without angina pectoris: Secondary | ICD-10-CM | POA: Diagnosis not present

## 2018-05-04 DIAGNOSIS — N183 Chronic kidney disease, stage 3 (moderate): Secondary | ICD-10-CM | POA: Diagnosis not present

## 2018-05-04 DIAGNOSIS — Z95810 Presence of automatic (implantable) cardiac defibrillator: Secondary | ICD-10-CM | POA: Diagnosis not present

## 2018-05-04 DIAGNOSIS — I482 Chronic atrial fibrillation, unspecified: Secondary | ICD-10-CM | POA: Diagnosis not present

## 2018-05-04 DIAGNOSIS — Z515 Encounter for palliative care: Secondary | ICD-10-CM | POA: Diagnosis not present

## 2018-05-04 DIAGNOSIS — I509 Heart failure, unspecified: Secondary | ICD-10-CM | POA: Diagnosis not present

## 2018-05-04 DIAGNOSIS — E440 Moderate protein-calorie malnutrition: Secondary | ICD-10-CM | POA: Diagnosis not present

## 2018-05-04 DIAGNOSIS — R1314 Dysphagia, pharyngoesophageal phase: Secondary | ICD-10-CM | POA: Diagnosis not present

## 2018-05-07 DIAGNOSIS — I482 Chronic atrial fibrillation, unspecified: Secondary | ICD-10-CM | POA: Diagnosis not present

## 2018-05-07 DIAGNOSIS — R1314 Dysphagia, pharyngoesophageal phase: Secondary | ICD-10-CM | POA: Diagnosis not present

## 2018-05-07 DIAGNOSIS — N183 Chronic kidney disease, stage 3 (moderate): Secondary | ICD-10-CM | POA: Diagnosis not present

## 2018-05-07 DIAGNOSIS — I509 Heart failure, unspecified: Secondary | ICD-10-CM | POA: Diagnosis not present

## 2018-05-07 DIAGNOSIS — E440 Moderate protein-calorie malnutrition: Secondary | ICD-10-CM | POA: Diagnosis not present

## 2018-05-07 DIAGNOSIS — I2510 Atherosclerotic heart disease of native coronary artery without angina pectoris: Secondary | ICD-10-CM | POA: Diagnosis not present

## 2018-05-08 DIAGNOSIS — R1314 Dysphagia, pharyngoesophageal phase: Secondary | ICD-10-CM | POA: Diagnosis not present

## 2018-05-08 DIAGNOSIS — I2510 Atherosclerotic heart disease of native coronary artery without angina pectoris: Secondary | ICD-10-CM | POA: Diagnosis not present

## 2018-05-08 DIAGNOSIS — I482 Chronic atrial fibrillation, unspecified: Secondary | ICD-10-CM | POA: Diagnosis not present

## 2018-05-08 DIAGNOSIS — E440 Moderate protein-calorie malnutrition: Secondary | ICD-10-CM | POA: Diagnosis not present

## 2018-05-08 DIAGNOSIS — I509 Heart failure, unspecified: Secondary | ICD-10-CM | POA: Diagnosis not present

## 2018-05-08 DIAGNOSIS — N183 Chronic kidney disease, stage 3 (moderate): Secondary | ICD-10-CM | POA: Diagnosis not present

## 2018-05-10 DIAGNOSIS — F4321 Adjustment disorder with depressed mood: Secondary | ICD-10-CM | POA: Diagnosis not present

## 2018-05-15 DIAGNOSIS — I2510 Atherosclerotic heart disease of native coronary artery without angina pectoris: Secondary | ICD-10-CM | POA: Diagnosis not present

## 2018-05-15 DIAGNOSIS — N183 Chronic kidney disease, stage 3 (moderate): Secondary | ICD-10-CM | POA: Diagnosis not present

## 2018-05-15 DIAGNOSIS — R1314 Dysphagia, pharyngoesophageal phase: Secondary | ICD-10-CM | POA: Diagnosis not present

## 2018-05-15 DIAGNOSIS — I482 Chronic atrial fibrillation, unspecified: Secondary | ICD-10-CM | POA: Diagnosis not present

## 2018-05-15 DIAGNOSIS — E440 Moderate protein-calorie malnutrition: Secondary | ICD-10-CM | POA: Diagnosis not present

## 2018-05-15 DIAGNOSIS — I509 Heart failure, unspecified: Secondary | ICD-10-CM | POA: Diagnosis not present

## 2018-05-17 DIAGNOSIS — G3184 Mild cognitive impairment, so stated: Secondary | ICD-10-CM | POA: Diagnosis not present

## 2018-05-17 DIAGNOSIS — F4321 Adjustment disorder with depressed mood: Secondary | ICD-10-CM | POA: Diagnosis not present

## 2018-05-22 DIAGNOSIS — R627 Adult failure to thrive: Secondary | ICD-10-CM | POA: Diagnosis not present

## 2018-05-22 DIAGNOSIS — I482 Chronic atrial fibrillation, unspecified: Secondary | ICD-10-CM | POA: Diagnosis not present

## 2018-05-22 DIAGNOSIS — I4891 Unspecified atrial fibrillation: Secondary | ICD-10-CM | POA: Diagnosis not present

## 2018-05-22 DIAGNOSIS — R1310 Dysphagia, unspecified: Secondary | ICD-10-CM | POA: Diagnosis not present

## 2018-05-22 DIAGNOSIS — R1314 Dysphagia, pharyngoesophageal phase: Secondary | ICD-10-CM | POA: Diagnosis not present

## 2018-05-22 DIAGNOSIS — N183 Chronic kidney disease, stage 3 (moderate): Secondary | ICD-10-CM | POA: Diagnosis not present

## 2018-05-22 DIAGNOSIS — I2510 Atherosclerotic heart disease of native coronary artery without angina pectoris: Secondary | ICD-10-CM | POA: Diagnosis not present

## 2018-05-22 DIAGNOSIS — E440 Moderate protein-calorie malnutrition: Secondary | ICD-10-CM | POA: Diagnosis not present

## 2018-05-22 DIAGNOSIS — I5023 Acute on chronic systolic (congestive) heart failure: Secondary | ICD-10-CM | POA: Diagnosis not present

## 2018-05-22 DIAGNOSIS — I509 Heart failure, unspecified: Secondary | ICD-10-CM | POA: Diagnosis not present

## 2018-05-23 ENCOUNTER — Other Ambulatory Visit: Payer: Self-pay | Admitting: *Deleted

## 2018-05-23 NOTE — Patient Outreach (Signed)
[facility]) Care Management  05/23/2018  [name] Jun 17, 1932 [id number]  Telephone screening   Referral source: Insurance Plan  In review of Epic noted patient is a long term care resident at [facility] in [place].    Joylene Draft, RN, [place] Management Coordinator  [phone]- Mobile [phone]- Toll Free Main Office

## 2018-05-24 DIAGNOSIS — N183 Chronic kidney disease, stage 3 (moderate): Secondary | ICD-10-CM | POA: Diagnosis not present

## 2018-05-24 DIAGNOSIS — I2510 Atherosclerotic heart disease of native coronary artery without angina pectoris: Secondary | ICD-10-CM | POA: Diagnosis not present

## 2018-05-24 DIAGNOSIS — F4321 Adjustment disorder with depressed mood: Secondary | ICD-10-CM | POA: Diagnosis not present

## 2018-05-24 DIAGNOSIS — E440 Moderate protein-calorie malnutrition: Secondary | ICD-10-CM | POA: Diagnosis not present

## 2018-05-24 DIAGNOSIS — I509 Heart failure, unspecified: Secondary | ICD-10-CM | POA: Diagnosis not present

## 2018-05-24 DIAGNOSIS — I482 Chronic atrial fibrillation, unspecified: Secondary | ICD-10-CM | POA: Diagnosis not present

## 2018-05-24 DIAGNOSIS — R1314 Dysphagia, pharyngoesophageal phase: Secondary | ICD-10-CM | POA: Diagnosis not present

## 2018-05-29 DIAGNOSIS — I482 Chronic atrial fibrillation, unspecified: Secondary | ICD-10-CM | POA: Diagnosis not present

## 2018-05-29 DIAGNOSIS — N183 Chronic kidney disease, stage 3 (moderate): Secondary | ICD-10-CM | POA: Diagnosis not present

## 2018-05-29 DIAGNOSIS — I2510 Atherosclerotic heart disease of native coronary artery without angina pectoris: Secondary | ICD-10-CM | POA: Diagnosis not present

## 2018-05-29 DIAGNOSIS — I509 Heart failure, unspecified: Secondary | ICD-10-CM | POA: Diagnosis not present

## 2018-05-29 DIAGNOSIS — E440 Moderate protein-calorie malnutrition: Secondary | ICD-10-CM | POA: Diagnosis not present

## 2018-05-29 DIAGNOSIS — R1314 Dysphagia, pharyngoesophageal phase: Secondary | ICD-10-CM | POA: Diagnosis not present

## 2018-05-31 DIAGNOSIS — F4321 Adjustment disorder with depressed mood: Secondary | ICD-10-CM | POA: Diagnosis not present

## 2018-05-31 DIAGNOSIS — R600 Localized edema: Secondary | ICD-10-CM | POA: Diagnosis not present

## 2018-06-01 DIAGNOSIS — E440 Moderate protein-calorie malnutrition: Secondary | ICD-10-CM | POA: Diagnosis not present

## 2018-06-01 DIAGNOSIS — I2510 Atherosclerotic heart disease of native coronary artery without angina pectoris: Secondary | ICD-10-CM | POA: Diagnosis not present

## 2018-06-01 DIAGNOSIS — R1314 Dysphagia, pharyngoesophageal phase: Secondary | ICD-10-CM | POA: Diagnosis not present

## 2018-06-01 DIAGNOSIS — Z515 Encounter for palliative care: Secondary | ICD-10-CM | POA: Diagnosis not present

## 2018-06-01 DIAGNOSIS — I482 Chronic atrial fibrillation, unspecified: Secondary | ICD-10-CM | POA: Diagnosis not present

## 2018-06-01 DIAGNOSIS — Z95810 Presence of automatic (implantable) cardiac defibrillator: Secondary | ICD-10-CM | POA: Diagnosis not present

## 2018-06-01 DIAGNOSIS — N183 Chronic kidney disease, stage 3 (moderate): Secondary | ICD-10-CM | POA: Diagnosis not present

## 2018-06-01 DIAGNOSIS — I509 Heart failure, unspecified: Secondary | ICD-10-CM | POA: Diagnosis not present

## 2018-06-05 DIAGNOSIS — R1314 Dysphagia, pharyngoesophageal phase: Secondary | ICD-10-CM | POA: Diagnosis not present

## 2018-06-05 DIAGNOSIS — E440 Moderate protein-calorie malnutrition: Secondary | ICD-10-CM | POA: Diagnosis not present

## 2018-06-05 DIAGNOSIS — I509 Heart failure, unspecified: Secondary | ICD-10-CM | POA: Diagnosis not present

## 2018-06-05 DIAGNOSIS — I482 Chronic atrial fibrillation, unspecified: Secondary | ICD-10-CM | POA: Diagnosis not present

## 2018-06-05 DIAGNOSIS — F419 Anxiety disorder, unspecified: Secondary | ICD-10-CM | POA: Diagnosis not present

## 2018-06-05 DIAGNOSIS — R627 Adult failure to thrive: Secondary | ICD-10-CM | POA: Diagnosis not present

## 2018-06-05 DIAGNOSIS — N183 Chronic kidney disease, stage 3 (moderate): Secondary | ICD-10-CM | POA: Diagnosis not present

## 2018-06-05 DIAGNOSIS — R451 Restlessness and agitation: Secondary | ICD-10-CM | POA: Diagnosis not present

## 2018-06-05 DIAGNOSIS — R600 Localized edema: Secondary | ICD-10-CM | POA: Diagnosis not present

## 2018-06-05 DIAGNOSIS — I2510 Atherosclerotic heart disease of native coronary artery without angina pectoris: Secondary | ICD-10-CM | POA: Diagnosis not present

## 2018-06-06 DIAGNOSIS — I2510 Atherosclerotic heart disease of native coronary artery without angina pectoris: Secondary | ICD-10-CM | POA: Diagnosis not present

## 2018-06-06 DIAGNOSIS — I509 Heart failure, unspecified: Secondary | ICD-10-CM | POA: Diagnosis not present

## 2018-06-06 DIAGNOSIS — I482 Chronic atrial fibrillation, unspecified: Secondary | ICD-10-CM | POA: Diagnosis not present

## 2018-06-06 DIAGNOSIS — R1314 Dysphagia, pharyngoesophageal phase: Secondary | ICD-10-CM | POA: Diagnosis not present

## 2018-06-06 DIAGNOSIS — N183 Chronic kidney disease, stage 3 (moderate): Secondary | ICD-10-CM | POA: Diagnosis not present

## 2018-06-06 DIAGNOSIS — E440 Moderate protein-calorie malnutrition: Secondary | ICD-10-CM | POA: Diagnosis not present

## 2018-06-29 ENCOUNTER — Encounter: Payer: Self-pay | Admitting: Oncology

## 2018-07-02 ENCOUNTER — Telehealth: Payer: Self-pay | Admitting: Internal Medicine

## 2018-07-02 DEATH — deceased

## 2018-11-30 IMAGING — CR DG CHEST 2V
2 series · 3 of 3 positions shown · non-contrast
Comparison: 04/01/2017 CT chest 03/26/2017, radiographs 03/25/2017,
03/03/2017, 12/23/2016

CLINICAL DATA: Fever and cough

EXAM:
CHEST - 2 VIEW

[chest lat]
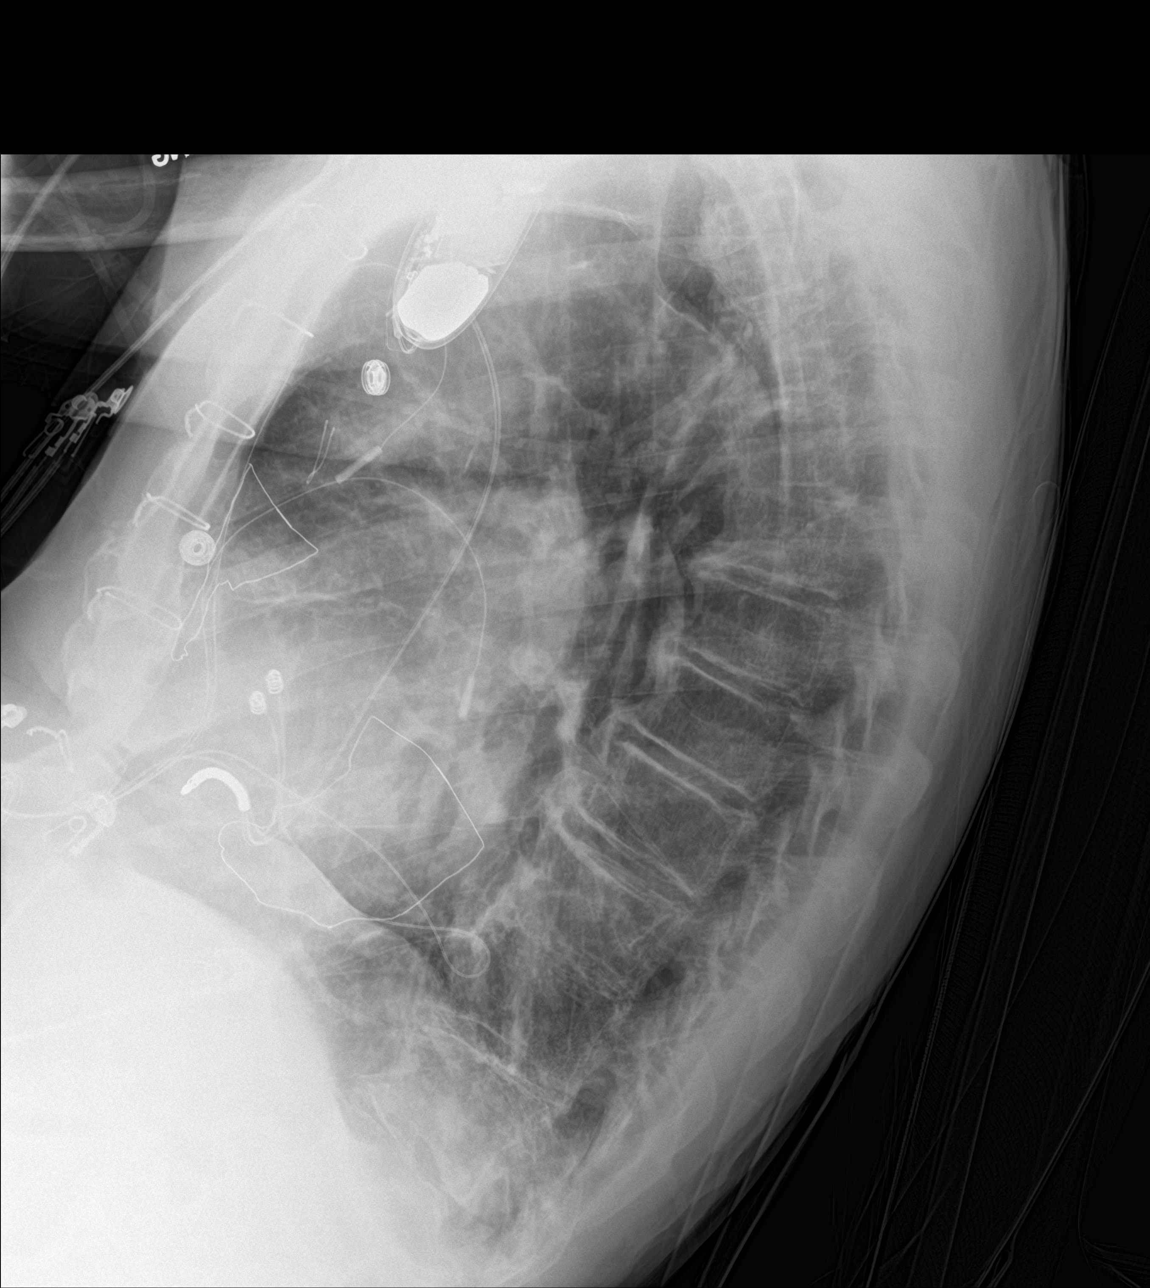

[Series 3: chest ap · 0.14mm/px · 2 of 2 slices shown]
[im 1/2]
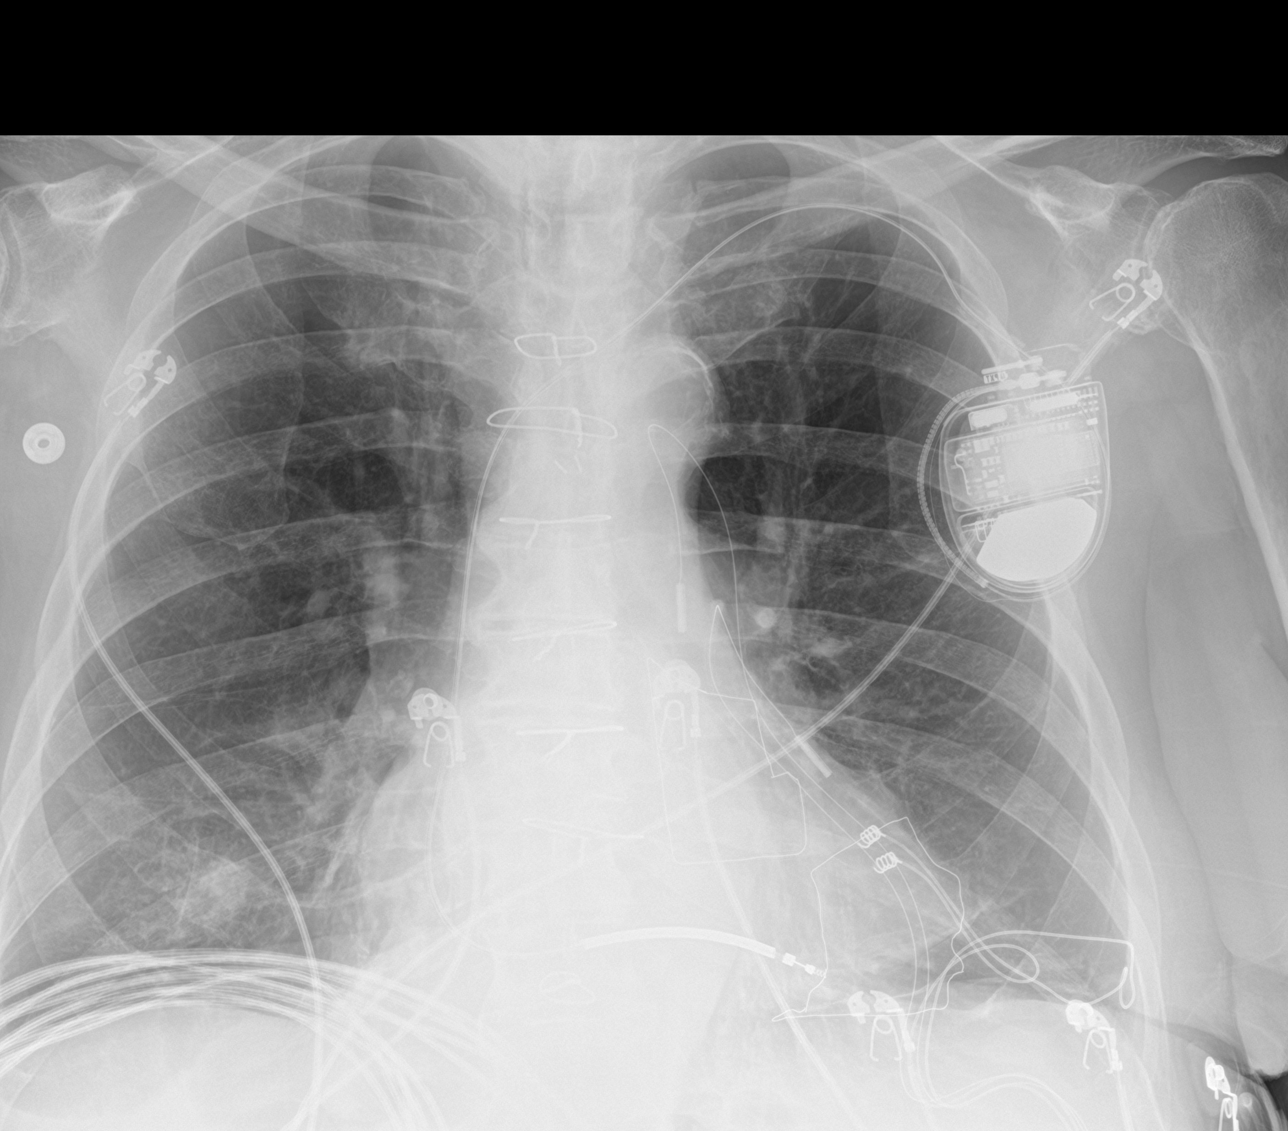
[im 2/2]
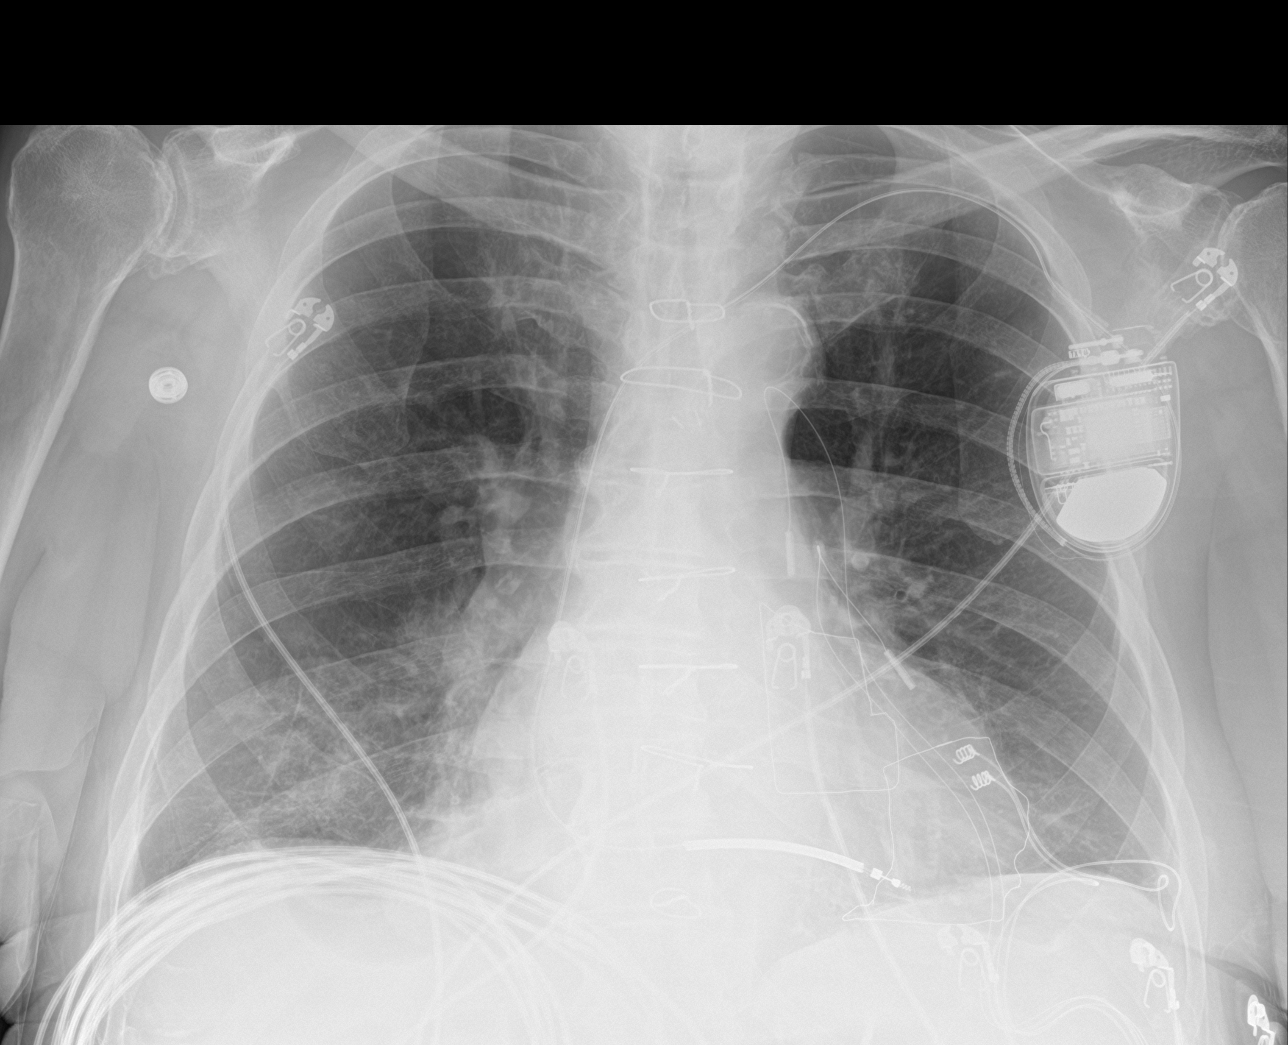

[3 of 3 positions shown; findings below may reference images not displayed]

FINDINGS: Post sternotomy changes. Left-sided pacing device as before.
Borderline to mild cardiomegaly. Aortic atherosclerosis. Patchy
infiltrate at the right base. No pneumothorax. Degenerative changes
of the spine.
IMPRESSION: Suspected patchy infiltrate at the right base, short interval
radiographic follow-up suggested to ensure clearing. Cardiomegaly.

## 2018-12-02 IMAGING — DX DG CHEST 1V PORT
1 series · 1 of 1 positions shown · non-contrast
Comparison: 04/07/2017

CLINICAL DATA: Hypoxia

EXAM:
PORTABLE CHEST 1 VIEW

[chest ap]
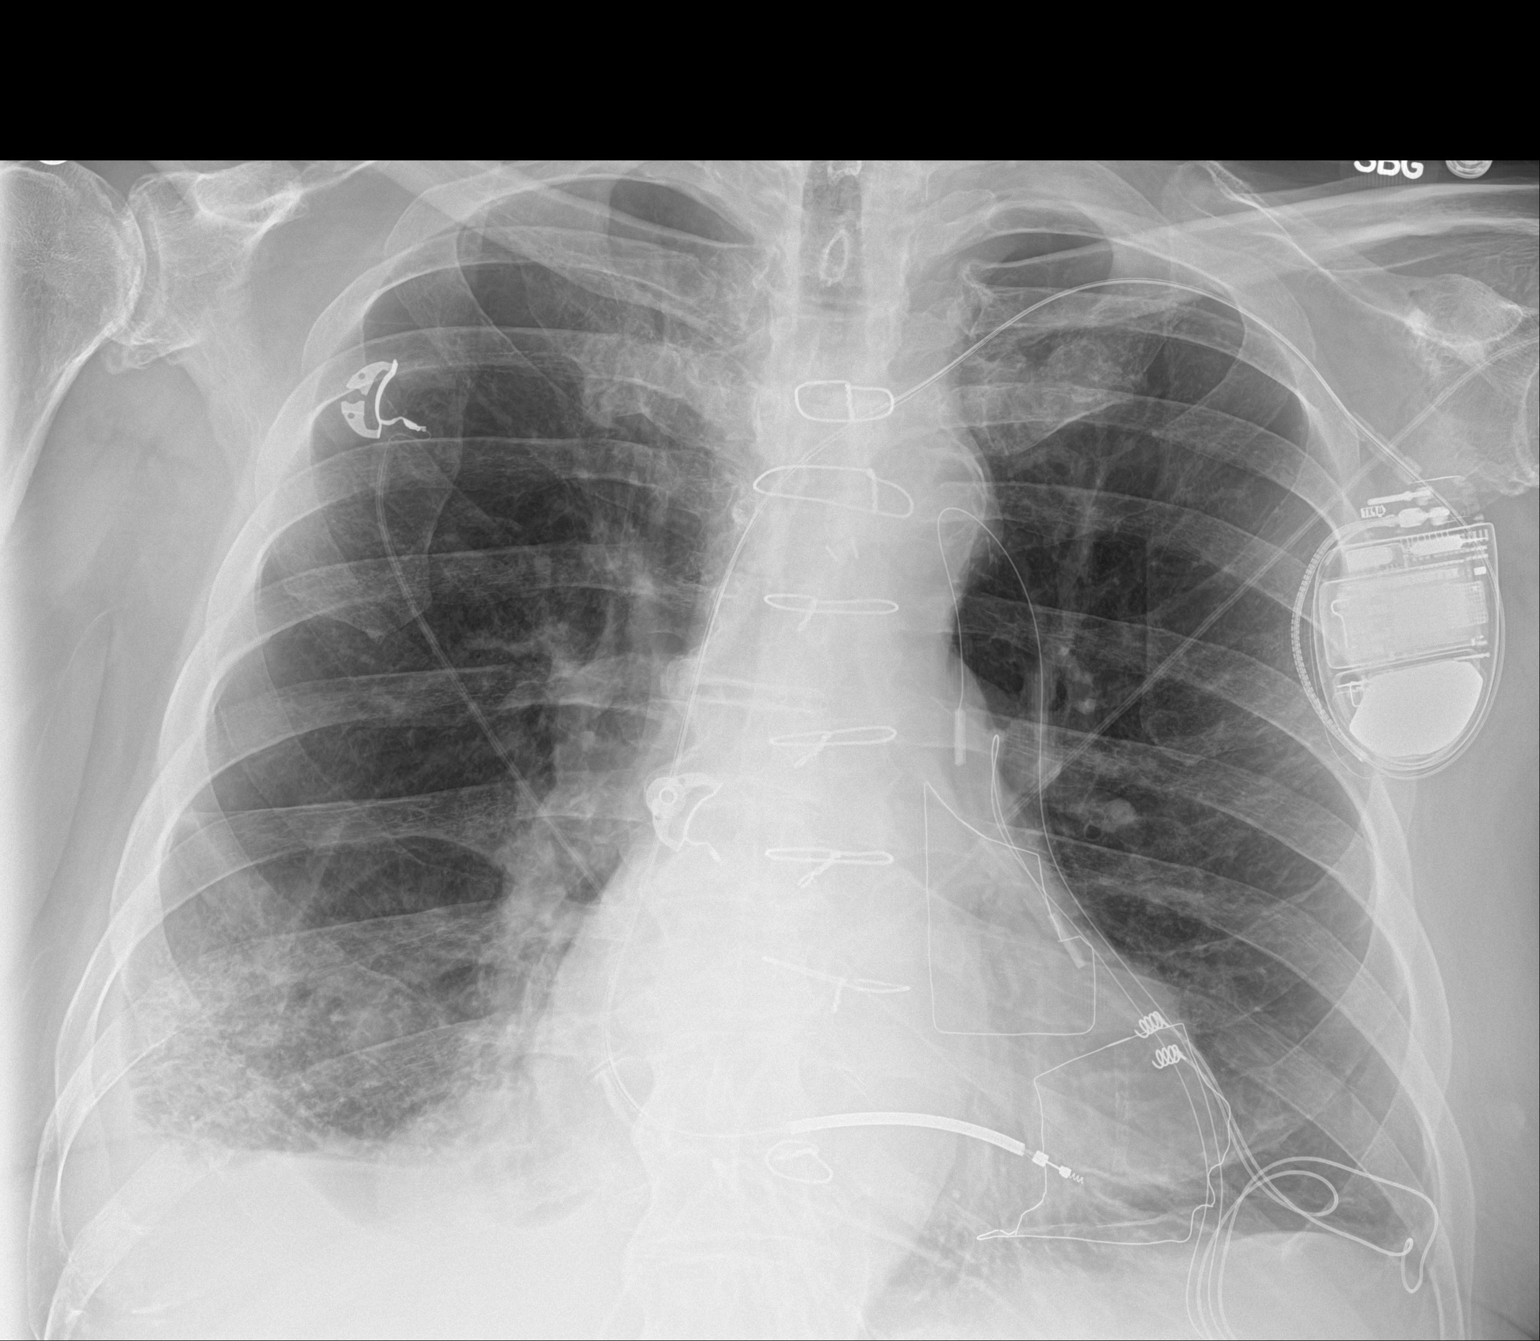

[1 of 1 positions shown; findings below may reference images not displayed]

FINDINGS: Left AICD remains in place, unchanged. Epicardial pacer wires noted
over the left heart, stable. Cardiomegaly. There is hyperinflation
of the lungs compatible with COPD. Increasing right basilar airspace
opacity and small right effusion concerning for pneumonia.
IMPRESSION: COPD, cardiomegaly, stable.

Increasing right basilar airspace opacity and small right effusion
concerning for pneumonia.
